# Patient Record
Sex: Female | Born: 1970 | Race: Black or African American | Hispanic: No | Marital: Married | Smoking: Never smoker
Health system: Southern US, Community
[De-identification: ages and names within clinical notes are randomized; demographics above are authoritative.]

## PROBLEM LIST (undated history)

## (undated) DIAGNOSIS — F419 Anxiety disorder, unspecified: Secondary | ICD-10-CM

## (undated) DIAGNOSIS — N179 Acute kidney failure, unspecified: Secondary | ICD-10-CM

## (undated) DIAGNOSIS — F329 Major depressive disorder, single episode, unspecified: Secondary | ICD-10-CM

## (undated) DIAGNOSIS — T50902A Poisoning by unspecified drugs, medicaments and biological substances, intentional self-harm, initial encounter: Secondary | ICD-10-CM

## (undated) DIAGNOSIS — M6282 Rhabdomyolysis: Secondary | ICD-10-CM

## (undated) DIAGNOSIS — F32A Depression, unspecified: Secondary | ICD-10-CM

## (undated) DIAGNOSIS — I10 Essential (primary) hypertension: Secondary | ICD-10-CM

## (undated) DIAGNOSIS — J45909 Unspecified asthma, uncomplicated: Secondary | ICD-10-CM

## (undated) HISTORY — PX: CHOLECYSTECTOMY: SHX55

## (undated) HISTORY — DX: Poisoning by unspecified drugs, medicaments and biological substances, intentional self-harm, initial encounter: T50.902A

## (undated) HISTORY — DX: Rhabdomyolysis: M62.82

## (undated) HISTORY — DX: Acute kidney failure, unspecified: N17.9

---

## 1996-03-10 HISTORY — PX: TUBAL LIGATION: SHX77

## 1997-08-29 ENCOUNTER — Emergency Department (HOSPITAL_COMMUNITY): Admission: EM | Admit: 1997-08-29 | Discharge: 1997-08-29 | Payer: Self-pay | Admitting: Emergency Medicine

## 1998-02-21 ENCOUNTER — Emergency Department (HOSPITAL_COMMUNITY): Admission: EM | Admit: 1998-02-21 | Discharge: 1998-02-21 | Payer: Self-pay | Admitting: Emergency Medicine

## 1998-09-21 ENCOUNTER — Encounter: Admission: RE | Admit: 1998-09-21 | Discharge: 1998-09-21 | Payer: Self-pay | Admitting: Family Medicine

## 1998-10-23 ENCOUNTER — Encounter: Admission: RE | Admit: 1998-10-23 | Discharge: 1998-10-23 | Payer: Self-pay | Admitting: Sports Medicine

## 1998-10-25 ENCOUNTER — Encounter: Admission: RE | Admit: 1998-10-25 | Discharge: 1998-10-25 | Payer: Self-pay | Admitting: Family Medicine

## 1998-11-06 ENCOUNTER — Encounter: Admission: RE | Admit: 1998-11-06 | Discharge: 1998-11-06 | Payer: Self-pay | Admitting: Sports Medicine

## 1998-11-27 ENCOUNTER — Encounter: Admission: RE | Admit: 1998-11-27 | Discharge: 1998-11-27 | Payer: Self-pay | Admitting: Sports Medicine

## 1998-11-29 ENCOUNTER — Encounter: Admission: RE | Admit: 1998-11-29 | Discharge: 1998-11-29 | Payer: Self-pay | Admitting: Family Medicine

## 1999-02-05 ENCOUNTER — Encounter: Admission: RE | Admit: 1999-02-05 | Discharge: 1999-02-05 | Payer: Self-pay | Admitting: Sports Medicine

## 1999-05-10 ENCOUNTER — Encounter: Admission: RE | Admit: 1999-05-10 | Discharge: 1999-05-10 | Payer: Self-pay | Admitting: Family Medicine

## 1999-05-16 ENCOUNTER — Encounter: Admission: RE | Admit: 1999-05-16 | Discharge: 1999-05-16 | Payer: Self-pay | Admitting: *Deleted

## 1999-09-05 ENCOUNTER — Encounter: Admission: RE | Admit: 1999-09-05 | Discharge: 1999-09-05 | Payer: Self-pay | Admitting: Family Medicine

## 1999-09-22 ENCOUNTER — Emergency Department (HOSPITAL_COMMUNITY): Admission: EM | Admit: 1999-09-22 | Discharge: 1999-09-22 | Payer: Self-pay | Admitting: *Deleted

## 2001-05-25 ENCOUNTER — Encounter: Admission: RE | Admit: 2001-05-25 | Discharge: 2001-05-25 | Payer: Self-pay | Admitting: Family Medicine

## 2001-08-05 ENCOUNTER — Emergency Department (HOSPITAL_COMMUNITY): Admission: EM | Admit: 2001-08-05 | Discharge: 2001-08-05 | Payer: Self-pay | Admitting: Emergency Medicine

## 2001-08-05 ENCOUNTER — Encounter: Payer: Self-pay | Admitting: Emergency Medicine

## 2001-09-09 ENCOUNTER — Encounter: Admission: RE | Admit: 2001-09-09 | Discharge: 2001-09-09 | Payer: Self-pay | Admitting: Family Medicine

## 2001-10-19 ENCOUNTER — Encounter: Admission: RE | Admit: 2001-10-19 | Discharge: 2001-10-19 | Payer: Self-pay | Admitting: Family Medicine

## 2001-11-05 ENCOUNTER — Encounter: Admission: RE | Admit: 2001-11-05 | Discharge: 2001-11-05 | Payer: Self-pay | Admitting: Family Medicine

## 2002-02-11 ENCOUNTER — Encounter: Admission: RE | Admit: 2002-02-11 | Discharge: 2002-02-11 | Payer: Self-pay | Admitting: Family Medicine

## 2002-03-07 ENCOUNTER — Encounter: Admission: RE | Admit: 2002-03-07 | Discharge: 2002-03-07 | Payer: Self-pay | Admitting: Family Medicine

## 2002-04-12 ENCOUNTER — Encounter: Payer: Self-pay | Admitting: Emergency Medicine

## 2002-04-12 ENCOUNTER — Emergency Department (HOSPITAL_COMMUNITY): Admission: EM | Admit: 2002-04-12 | Discharge: 2002-04-12 | Payer: Self-pay | Admitting: Emergency Medicine

## 2002-08-29 ENCOUNTER — Encounter: Admission: RE | Admit: 2002-08-29 | Discharge: 2002-08-29 | Payer: Self-pay | Admitting: Family Medicine

## 2002-11-25 ENCOUNTER — Encounter: Admission: RE | Admit: 2002-11-25 | Discharge: 2002-11-25 | Payer: Self-pay | Admitting: Sports Medicine

## 2002-12-06 ENCOUNTER — Encounter: Admission: RE | Admit: 2002-12-06 | Discharge: 2002-12-06 | Payer: Self-pay | Admitting: Family Medicine

## 2003-01-02 ENCOUNTER — Encounter: Admission: RE | Admit: 2003-01-02 | Discharge: 2003-01-02 | Payer: Self-pay | Admitting: Family Medicine

## 2003-02-16 ENCOUNTER — Encounter: Admission: RE | Admit: 2003-02-16 | Discharge: 2003-02-16 | Payer: Self-pay | Admitting: Family Medicine

## 2003-02-19 ENCOUNTER — Emergency Department (HOSPITAL_COMMUNITY): Admission: EM | Admit: 2003-02-19 | Discharge: 2003-02-19 | Payer: Self-pay | Admitting: Emergency Medicine

## 2003-02-21 ENCOUNTER — Encounter: Admission: RE | Admit: 2003-02-21 | Discharge: 2003-02-21 | Payer: Self-pay | Admitting: Sports Medicine

## 2003-02-21 ENCOUNTER — Ambulatory Visit (HOSPITAL_COMMUNITY): Admission: RE | Admit: 2003-02-21 | Discharge: 2003-02-21 | Payer: Self-pay | Admitting: Sports Medicine

## 2003-02-27 ENCOUNTER — Encounter: Admission: RE | Admit: 2003-02-27 | Discharge: 2003-02-27 | Payer: Self-pay | Admitting: Family Medicine

## 2003-05-26 ENCOUNTER — Encounter: Admission: RE | Admit: 2003-05-26 | Discharge: 2003-05-26 | Payer: Self-pay | Admitting: Sports Medicine

## 2003-06-06 ENCOUNTER — Encounter: Admission: RE | Admit: 2003-06-06 | Discharge: 2003-06-06 | Payer: Self-pay | Admitting: Sports Medicine

## 2003-06-28 ENCOUNTER — Encounter: Admission: RE | Admit: 2003-06-28 | Discharge: 2003-06-28 | Payer: Self-pay | Admitting: Family Medicine

## 2003-07-03 ENCOUNTER — Encounter: Admission: RE | Admit: 2003-07-03 | Discharge: 2003-07-03 | Payer: Self-pay | Admitting: Family Medicine

## 2003-07-11 ENCOUNTER — Encounter: Admission: RE | Admit: 2003-07-11 | Discharge: 2003-07-11 | Payer: Self-pay | Admitting: Sports Medicine

## 2003-07-19 ENCOUNTER — Encounter: Admission: RE | Admit: 2003-07-19 | Discharge: 2003-07-19 | Payer: Self-pay | Admitting: Family Medicine

## 2003-08-17 ENCOUNTER — Encounter: Admission: RE | Admit: 2003-08-17 | Discharge: 2003-08-17 | Payer: Self-pay | Admitting: Family Medicine

## 2003-11-14 ENCOUNTER — Ambulatory Visit: Payer: Self-pay | Admitting: Family Medicine

## 2004-04-04 ENCOUNTER — Ambulatory Visit: Payer: Self-pay | Admitting: Family Medicine

## 2004-05-20 ENCOUNTER — Ambulatory Visit: Payer: Self-pay | Admitting: Sports Medicine

## 2004-08-19 ENCOUNTER — Ambulatory Visit: Payer: Self-pay | Admitting: Sports Medicine

## 2004-11-06 ENCOUNTER — Ambulatory Visit: Payer: Self-pay | Admitting: Family Medicine

## 2005-01-13 ENCOUNTER — Ambulatory Visit: Payer: Self-pay | Admitting: Family Medicine

## 2005-01-15 ENCOUNTER — Emergency Department (HOSPITAL_COMMUNITY): Admission: EM | Admit: 2005-01-15 | Discharge: 2005-01-15 | Payer: Self-pay | Admitting: Emergency Medicine

## 2005-01-22 ENCOUNTER — Ambulatory Visit: Payer: Self-pay | Admitting: Family Medicine

## 2005-01-22 ENCOUNTER — Encounter: Admission: RE | Admit: 2005-01-22 | Discharge: 2005-01-22 | Payer: Self-pay | Admitting: Sports Medicine

## 2005-02-18 ENCOUNTER — Ambulatory Visit: Payer: Self-pay | Admitting: Family Medicine

## 2005-02-19 ENCOUNTER — Encounter: Admission: RE | Admit: 2005-02-19 | Discharge: 2005-02-19 | Payer: Self-pay | Admitting: Sports Medicine

## 2005-03-06 ENCOUNTER — Encounter: Admission: RE | Admit: 2005-03-06 | Discharge: 2005-03-06 | Payer: Self-pay | Admitting: Sports Medicine

## 2005-03-06 ENCOUNTER — Ambulatory Visit: Payer: Self-pay | Admitting: Sports Medicine

## 2005-03-12 ENCOUNTER — Ambulatory Visit: Payer: Self-pay | Admitting: Sports Medicine

## 2005-03-27 ENCOUNTER — Encounter: Admission: RE | Admit: 2005-03-27 | Discharge: 2005-03-27 | Payer: Self-pay | Admitting: Sports Medicine

## 2005-03-28 ENCOUNTER — Ambulatory Visit: Payer: Self-pay | Admitting: Sports Medicine

## 2005-04-03 ENCOUNTER — Emergency Department (HOSPITAL_COMMUNITY): Admission: EM | Admit: 2005-04-03 | Discharge: 2005-04-04 | Payer: Self-pay | Admitting: Emergency Medicine

## 2005-04-23 ENCOUNTER — Encounter: Admission: RE | Admit: 2005-04-23 | Discharge: 2005-04-23 | Payer: Self-pay | Admitting: Sports Medicine

## 2005-04-25 ENCOUNTER — Ambulatory Visit: Payer: Self-pay | Admitting: Sports Medicine

## 2005-05-05 ENCOUNTER — Ambulatory Visit: Payer: Self-pay | Admitting: Family Medicine

## 2005-06-10 ENCOUNTER — Ambulatory Visit: Payer: Self-pay | Admitting: Sports Medicine

## 2005-06-13 ENCOUNTER — Ambulatory Visit: Payer: Self-pay | Admitting: Family Medicine

## 2006-01-07 ENCOUNTER — Ambulatory Visit (HOSPITAL_COMMUNITY): Admission: RE | Admit: 2006-01-07 | Discharge: 2006-01-07 | Payer: Self-pay | Admitting: Family Medicine

## 2006-01-07 ENCOUNTER — Ambulatory Visit: Payer: Self-pay | Admitting: Family Medicine

## 2006-05-07 DIAGNOSIS — E039 Hypothyroidism, unspecified: Secondary | ICD-10-CM | POA: Insufficient documentation

## 2006-05-07 DIAGNOSIS — D509 Iron deficiency anemia, unspecified: Secondary | ICD-10-CM

## 2006-05-07 DIAGNOSIS — E118 Type 2 diabetes mellitus with unspecified complications: Secondary | ICD-10-CM

## 2006-05-07 DIAGNOSIS — E119 Type 2 diabetes mellitus without complications: Secondary | ICD-10-CM | POA: Insufficient documentation

## 2006-05-07 DIAGNOSIS — F411 Generalized anxiety disorder: Secondary | ICD-10-CM | POA: Insufficient documentation

## 2006-05-07 DIAGNOSIS — F429 Obsessive-compulsive disorder, unspecified: Secondary | ICD-10-CM | POA: Insufficient documentation

## 2006-05-07 DIAGNOSIS — F339 Major depressive disorder, recurrent, unspecified: Secondary | ICD-10-CM

## 2006-05-07 DIAGNOSIS — F419 Anxiety disorder, unspecified: Secondary | ICD-10-CM | POA: Insufficient documentation

## 2006-05-08 ENCOUNTER — Encounter (INDEPENDENT_AMBULATORY_CARE_PROVIDER_SITE_OTHER): Payer: Self-pay | Admitting: Family Medicine

## 2006-05-08 ENCOUNTER — Ambulatory Visit: Payer: Self-pay | Admitting: Family Medicine

## 2006-05-08 LAB — CONVERTED CEMR LAB
Albumin: 4.1 g/dL (ref 3.5–5.2)
Alkaline Phosphatase: 41 units/L (ref 39–117)
Chloride: 106 meq/L (ref 96–112)
Creatinine, Ser: 0.84 mg/dL (ref 0.40–1.20)

## 2006-07-17 ENCOUNTER — Telehealth (INDEPENDENT_AMBULATORY_CARE_PROVIDER_SITE_OTHER): Payer: Self-pay | Admitting: *Deleted

## 2006-07-20 ENCOUNTER — Ambulatory Visit: Payer: Self-pay | Admitting: Sports Medicine

## 2006-07-30 ENCOUNTER — Encounter: Admission: RE | Admit: 2006-07-30 | Discharge: 2006-07-30 | Payer: Self-pay | Admitting: Sports Medicine

## 2006-08-07 ENCOUNTER — Ambulatory Visit: Payer: Self-pay | Admitting: Family Medicine

## 2006-08-26 ENCOUNTER — Ambulatory Visit: Payer: Self-pay | Admitting: Family Medicine

## 2006-08-26 LAB — CONVERTED CEMR LAB: Hgb A1c MFr Bld: 6.7 %

## 2006-08-27 ENCOUNTER — Encounter (INDEPENDENT_AMBULATORY_CARE_PROVIDER_SITE_OTHER): Payer: Self-pay | Admitting: Family Medicine

## 2006-09-09 ENCOUNTER — Telehealth (INDEPENDENT_AMBULATORY_CARE_PROVIDER_SITE_OTHER): Payer: Self-pay | Admitting: Family Medicine

## 2006-09-15 ENCOUNTER — Telehealth (INDEPENDENT_AMBULATORY_CARE_PROVIDER_SITE_OTHER): Payer: Self-pay

## 2006-09-17 ENCOUNTER — Encounter (INDEPENDENT_AMBULATORY_CARE_PROVIDER_SITE_OTHER): Payer: Self-pay | Admitting: Family Medicine

## 2006-09-18 ENCOUNTER — Encounter (INDEPENDENT_AMBULATORY_CARE_PROVIDER_SITE_OTHER): Payer: Self-pay | Admitting: *Deleted

## 2006-09-21 ENCOUNTER — Ambulatory Visit: Payer: Self-pay | Admitting: Family Medicine

## 2006-09-21 LAB — CONVERTED CEMR LAB: Beta hcg, urine, semiquantitative: NEGATIVE

## 2006-10-26 ENCOUNTER — Ambulatory Visit: Payer: Self-pay | Admitting: Family Medicine

## 2006-10-26 ENCOUNTER — Telehealth: Payer: Self-pay | Admitting: *Deleted

## 2006-10-26 ENCOUNTER — Encounter (INDEPENDENT_AMBULATORY_CARE_PROVIDER_SITE_OTHER): Payer: Self-pay | Admitting: Family Medicine

## 2006-10-26 LAB — CONVERTED CEMR LAB
GC Probe Amp, Genital: NEGATIVE
KOH Prep: NEGATIVE
Specific Gravity, Urine: 1.025
Whiff Test: NEGATIVE

## 2006-11-02 ENCOUNTER — Telehealth (INDEPENDENT_AMBULATORY_CARE_PROVIDER_SITE_OTHER): Payer: Self-pay | Admitting: Family Medicine

## 2007-01-14 ENCOUNTER — Telehealth (INDEPENDENT_AMBULATORY_CARE_PROVIDER_SITE_OTHER): Payer: Self-pay | Admitting: Family Medicine

## 2007-02-03 ENCOUNTER — Ambulatory Visit: Payer: Self-pay | Admitting: Family Medicine

## 2007-02-03 ENCOUNTER — Encounter: Payer: Self-pay | Admitting: Family Medicine

## 2007-02-03 DIAGNOSIS — I10 Essential (primary) hypertension: Secondary | ICD-10-CM

## 2007-02-03 LAB — CONVERTED CEMR LAB: Beta hcg, urine, semiquantitative: NEGATIVE

## 2007-02-05 ENCOUNTER — Observation Stay (HOSPITAL_COMMUNITY): Admission: AD | Admit: 2007-02-05 | Discharge: 2007-02-06 | Payer: Self-pay | Admitting: Family Medicine

## 2007-02-05 ENCOUNTER — Ambulatory Visit: Payer: Self-pay | Admitting: Family Medicine

## 2007-02-06 ENCOUNTER — Encounter: Payer: Self-pay | Admitting: Family Medicine

## 2007-02-08 ENCOUNTER — Telehealth: Payer: Self-pay | Admitting: *Deleted

## 2007-02-10 ENCOUNTER — Encounter (INDEPENDENT_AMBULATORY_CARE_PROVIDER_SITE_OTHER): Payer: Self-pay | Admitting: Family Medicine

## 2007-02-11 LAB — CONVERTED CEMR LAB
BUN: 10 mg/dL (ref 6–23)
Calcium: 9.5 mg/dL (ref 8.4–10.5)
Creatinine, Ser: 0.77 mg/dL (ref 0.40–1.20)
Hgb A1c MFr Bld: 7 % — ABNORMAL HIGH (ref 4.6–6.1)
MCV: 63.4 fL — ABNORMAL LOW (ref 78.0–100.0)
Platelets: 587 10*3/uL — ABNORMAL HIGH (ref 150–400)
RDW: 20.3 % — ABNORMAL HIGH (ref 11.5–15.5)
WBC: 12.5 10*3/uL — ABNORMAL HIGH (ref 4.0–10.5)

## 2007-03-31 ENCOUNTER — Ambulatory Visit: Payer: Self-pay | Admitting: Family Medicine

## 2007-04-01 ENCOUNTER — Telehealth (INDEPENDENT_AMBULATORY_CARE_PROVIDER_SITE_OTHER): Payer: Self-pay | Admitting: *Deleted

## 2007-04-01 ENCOUNTER — Telehealth (INDEPENDENT_AMBULATORY_CARE_PROVIDER_SITE_OTHER): Payer: Self-pay | Admitting: Family Medicine

## 2007-06-24 ENCOUNTER — Encounter (INDEPENDENT_AMBULATORY_CARE_PROVIDER_SITE_OTHER): Payer: Self-pay | Admitting: Family Medicine

## 2007-06-24 ENCOUNTER — Ambulatory Visit: Payer: Self-pay | Admitting: Family Medicine

## 2007-06-24 DIAGNOSIS — J309 Allergic rhinitis, unspecified: Secondary | ICD-10-CM | POA: Insufficient documentation

## 2007-06-25 ENCOUNTER — Encounter (INDEPENDENT_AMBULATORY_CARE_PROVIDER_SITE_OTHER): Payer: Self-pay | Admitting: Family Medicine

## 2007-06-25 LAB — CONVERTED CEMR LAB
Hemoglobin: 9.1 g/dL — ABNORMAL LOW (ref 12.0–15.0)
MCV: 71.2 fL — ABNORMAL LOW (ref 78.0–100.0)
Platelets: 630 10*3/uL — ABNORMAL HIGH (ref 150–400)
RBC: 4.34 M/uL (ref 3.87–5.11)
RDW: 15.8 % — ABNORMAL HIGH (ref 11.5–15.5)
TSH: 6.812 microintl units/mL — ABNORMAL HIGH (ref 0.350–5.50)
WBC: 9.4 10*3/uL (ref 4.0–10.5)

## 2007-07-07 ENCOUNTER — Telehealth: Payer: Self-pay | Admitting: *Deleted

## 2007-09-09 ENCOUNTER — Telehealth (INDEPENDENT_AMBULATORY_CARE_PROVIDER_SITE_OTHER): Payer: Self-pay | Admitting: Family Medicine

## 2007-09-29 ENCOUNTER — Ambulatory Visit: Payer: Self-pay | Admitting: Family Medicine

## 2007-12-31 ENCOUNTER — Ambulatory Visit: Payer: Self-pay | Admitting: Family Medicine

## 2007-12-31 ENCOUNTER — Encounter (INDEPENDENT_AMBULATORY_CARE_PROVIDER_SITE_OTHER): Payer: Self-pay | Admitting: Family Medicine

## 2007-12-31 LAB — CONVERTED CEMR LAB
Platelets: 555 10*3/uL — ABNORMAL HIGH (ref 150–400)
WBC: 8.1 10*3/uL (ref 4.0–10.5)

## 2008-01-01 ENCOUNTER — Encounter (INDEPENDENT_AMBULATORY_CARE_PROVIDER_SITE_OTHER): Payer: Self-pay | Admitting: Family Medicine

## 2008-01-01 ENCOUNTER — Observation Stay (HOSPITAL_COMMUNITY): Admission: EM | Admit: 2008-01-01 | Discharge: 2008-01-03 | Payer: Self-pay | Admitting: Family Medicine

## 2008-01-01 ENCOUNTER — Ambulatory Visit: Payer: Self-pay | Admitting: Family Medicine

## 2008-01-01 LAB — CONVERTED CEMR LAB
Ferritin: 2 ng/mL
Retic Ct Pct: 1.4 %
TSH: 15.88 microintl units/mL
Triglycerides: 188 mg/dL

## 2008-01-02 ENCOUNTER — Encounter (INDEPENDENT_AMBULATORY_CARE_PROVIDER_SITE_OTHER): Payer: Self-pay | Admitting: Family Medicine

## 2008-01-03 ENCOUNTER — Encounter (INDEPENDENT_AMBULATORY_CARE_PROVIDER_SITE_OTHER): Payer: Self-pay | Admitting: Family Medicine

## 2008-01-03 DIAGNOSIS — N920 Excessive and frequent menstruation with regular cycle: Secondary | ICD-10-CM | POA: Insufficient documentation

## 2008-01-03 LAB — CONVERTED CEMR LAB: Retic Ct Pct: 1.4 %

## 2008-01-07 ENCOUNTER — Telehealth: Payer: Self-pay | Admitting: *Deleted

## 2008-01-28 ENCOUNTER — Encounter (INDEPENDENT_AMBULATORY_CARE_PROVIDER_SITE_OTHER): Payer: Self-pay | Admitting: Family Medicine

## 2008-01-28 ENCOUNTER — Ambulatory Visit: Payer: Self-pay | Admitting: Family Medicine

## 2008-01-28 LAB — CONVERTED CEMR LAB: Hemoglobin: 9.9 g/dL

## 2008-01-31 ENCOUNTER — Ambulatory Visit (HOSPITAL_COMMUNITY): Admission: RE | Admit: 2008-01-31 | Discharge: 2008-01-31 | Payer: Self-pay | Admitting: Family Medicine

## 2008-02-01 LAB — CONVERTED CEMR LAB
HCT: 35.5 % — ABNORMAL LOW (ref 36.0–46.0)
Hemoglobin: 10.6 g/dL — ABNORMAL LOW (ref 12.0–15.0)
MCV: 77.5 fL — ABNORMAL LOW (ref 78.0–100.0)
RBC: 4.58 M/uL (ref 3.87–5.11)
RDW: 28.1 % — ABNORMAL HIGH (ref 11.5–15.5)
TSH: 3.822 microintl units/mL (ref 0.350–4.50)

## 2008-02-07 ENCOUNTER — Telehealth: Payer: Self-pay | Admitting: *Deleted

## 2008-02-07 DIAGNOSIS — N8500 Endometrial hyperplasia, unspecified: Secondary | ICD-10-CM | POA: Insufficient documentation

## 2008-03-01 ENCOUNTER — Encounter (INDEPENDENT_AMBULATORY_CARE_PROVIDER_SITE_OTHER): Payer: Self-pay | Admitting: Family Medicine

## 2008-03-01 ENCOUNTER — Ambulatory Visit: Payer: Self-pay | Admitting: Family Medicine

## 2008-03-01 LAB — CONVERTED CEMR LAB: Folate: >20 ng/mL

## 2008-03-07 ENCOUNTER — Encounter (INDEPENDENT_AMBULATORY_CARE_PROVIDER_SITE_OTHER): Payer: Self-pay | Admitting: Family Medicine

## 2008-03-28 ENCOUNTER — Telehealth: Payer: Self-pay | Admitting: *Deleted

## 2008-03-30 ENCOUNTER — Telehealth (INDEPENDENT_AMBULATORY_CARE_PROVIDER_SITE_OTHER): Payer: Self-pay | Admitting: Family Medicine

## 2008-05-04 ENCOUNTER — Encounter (INDEPENDENT_AMBULATORY_CARE_PROVIDER_SITE_OTHER): Payer: Self-pay | Admitting: Family Medicine

## 2008-05-04 ENCOUNTER — Telehealth: Payer: Self-pay | Admitting: *Deleted

## 2008-05-23 ENCOUNTER — Ambulatory Visit: Payer: Self-pay | Admitting: Family Medicine

## 2008-05-23 ENCOUNTER — Encounter (INDEPENDENT_AMBULATORY_CARE_PROVIDER_SITE_OTHER): Payer: Self-pay | Admitting: Family Medicine

## 2008-05-24 ENCOUNTER — Telehealth: Payer: Self-pay | Admitting: Family Medicine

## 2008-05-31 ENCOUNTER — Emergency Department (HOSPITAL_COMMUNITY): Admission: EM | Admit: 2008-05-31 | Discharge: 2008-05-31 | Payer: Self-pay | Admitting: Emergency Medicine

## 2008-07-03 ENCOUNTER — Telehealth (INDEPENDENT_AMBULATORY_CARE_PROVIDER_SITE_OTHER): Payer: Self-pay | Admitting: Family Medicine

## 2008-07-08 ENCOUNTER — Telehealth: Payer: Self-pay | Admitting: Family Medicine

## 2008-08-10 ENCOUNTER — Encounter (INDEPENDENT_AMBULATORY_CARE_PROVIDER_SITE_OTHER): Payer: Self-pay | Admitting: Family Medicine

## 2008-08-24 ENCOUNTER — Ambulatory Visit: Payer: Self-pay | Admitting: Obstetrics & Gynecology

## 2008-08-28 ENCOUNTER — Ambulatory Visit: Payer: Self-pay | Admitting: Family Medicine

## 2008-08-28 ENCOUNTER — Observation Stay (HOSPITAL_COMMUNITY): Admission: EM | Admit: 2008-08-28 | Discharge: 2008-08-29 | Payer: Self-pay | Admitting: Emergency Medicine

## 2008-08-29 ENCOUNTER — Telehealth: Payer: Self-pay | Admitting: *Deleted

## 2008-08-29 ENCOUNTER — Encounter: Payer: Self-pay | Admitting: Family Medicine

## 2008-08-29 ENCOUNTER — Encounter: Payer: Self-pay | Admitting: *Deleted

## 2008-09-07 ENCOUNTER — Ambulatory Visit: Payer: Self-pay | Admitting: Obstetrics & Gynecology

## 2008-09-07 LAB — CONVERTED CEMR LAB

## 2008-09-14 ENCOUNTER — Other Ambulatory Visit: Admission: RE | Admit: 2008-09-14 | Discharge: 2008-09-14 | Payer: Self-pay | Admitting: Obstetrics & Gynecology

## 2008-09-14 ENCOUNTER — Ambulatory Visit: Payer: Self-pay | Admitting: Obstetrics & Gynecology

## 2008-09-14 ENCOUNTER — Ambulatory Visit (HOSPITAL_COMMUNITY): Admission: RE | Admit: 2008-09-14 | Discharge: 2008-09-14 | Payer: Self-pay | Admitting: Family Medicine

## 2008-09-21 ENCOUNTER — Ambulatory Visit: Payer: Self-pay | Admitting: Obstetrics & Gynecology

## 2008-09-21 ENCOUNTER — Ambulatory Visit (HOSPITAL_COMMUNITY): Admission: RE | Admit: 2008-09-21 | Discharge: 2008-09-21 | Payer: Self-pay | Admitting: Obstetrics & Gynecology

## 2008-09-21 ENCOUNTER — Encounter: Payer: Self-pay | Admitting: Obstetrics & Gynecology

## 2008-10-18 ENCOUNTER — Telehealth: Payer: Self-pay | Admitting: Family Medicine

## 2008-11-03 ENCOUNTER — Encounter: Payer: Self-pay | Admitting: *Deleted

## 2008-11-18 ENCOUNTER — Emergency Department (HOSPITAL_COMMUNITY): Admission: EM | Admit: 2008-11-18 | Discharge: 2008-11-18 | Payer: Self-pay | Admitting: Emergency Medicine

## 2008-11-22 ENCOUNTER — Encounter: Payer: Self-pay | Admitting: Family Medicine

## 2008-11-30 ENCOUNTER — Encounter: Payer: Self-pay | Admitting: Family Medicine

## 2008-11-30 ENCOUNTER — Encounter (INDEPENDENT_AMBULATORY_CARE_PROVIDER_SITE_OTHER): Payer: Self-pay | Admitting: *Deleted

## 2008-11-30 ENCOUNTER — Ambulatory Visit: Payer: Self-pay | Admitting: Family Medicine

## 2008-11-30 DIAGNOSIS — K8020 Calculus of gallbladder without cholecystitis without obstruction: Secondary | ICD-10-CM | POA: Insufficient documentation

## 2008-11-30 LAB — CONVERTED CEMR LAB
ALT: 13 units/L (ref 0–35)
AST: 17 units/L (ref 0–37)
Albumin: 3.8 g/dL (ref 3.5–5.2)
Alkaline Phosphatase: 43 units/L (ref 39–117)
Calcium: 9.3 mg/dL (ref 8.4–10.5)
Chloride: 105 meq/L (ref 96–112)
Creatinine, Ser: 0.77 mg/dL (ref 0.40–1.20)
Hgb A1c MFr Bld: 8 %
Platelets: 510 10*3/uL — ABNORMAL HIGH (ref 150–400)
Potassium: 4.5 meq/L (ref 3.5–5.3)
RDW: 16.7 % — ABNORMAL HIGH (ref 11.5–15.5)

## 2008-12-01 ENCOUNTER — Telehealth: Payer: Self-pay | Admitting: *Deleted

## 2008-12-01 ENCOUNTER — Telehealth: Payer: Self-pay | Admitting: Family Medicine

## 2008-12-01 ENCOUNTER — Encounter (INDEPENDENT_AMBULATORY_CARE_PROVIDER_SITE_OTHER): Payer: Self-pay | Admitting: *Deleted

## 2009-01-16 ENCOUNTER — Telehealth: Payer: Self-pay | Admitting: *Deleted

## 2009-02-25 ENCOUNTER — Telehealth: Payer: Self-pay | Admitting: Family Medicine

## 2009-02-27 ENCOUNTER — Emergency Department (HOSPITAL_COMMUNITY): Admission: EM | Admit: 2009-02-27 | Discharge: 2009-02-27 | Payer: Self-pay | Admitting: Emergency Medicine

## 2009-03-06 ENCOUNTER — Telehealth: Payer: Self-pay | Admitting: Family Medicine

## 2009-03-06 ENCOUNTER — Encounter: Payer: Self-pay | Admitting: *Deleted

## 2009-03-06 ENCOUNTER — Ambulatory Visit: Payer: Self-pay | Admitting: Family Medicine

## 2009-03-06 ENCOUNTER — Encounter: Payer: Self-pay | Admitting: Family Medicine

## 2009-03-06 LAB — CONVERTED CEMR LAB
Albumin: 4.1 g/dL (ref 3.5–5.2)
Alkaline Phosphatase: 43 units/L (ref 39–117)
BUN: 11 mg/dL (ref 6–23)
CO2: 23 meq/L (ref 19–32)
Glucose, Bld: 173 mg/dL — ABNORMAL HIGH (ref 70–99)
Potassium: 4.2 meq/L (ref 3.5–5.3)

## 2009-04-20 ENCOUNTER — Encounter: Payer: Self-pay | Admitting: Family Medicine

## 2009-04-20 ENCOUNTER — Ambulatory Visit: Payer: Self-pay | Admitting: Family Medicine

## 2009-05-09 ENCOUNTER — Emergency Department (HOSPITAL_COMMUNITY): Admission: EM | Admit: 2009-05-09 | Discharge: 2009-05-09 | Payer: Self-pay | Admitting: Emergency Medicine

## 2009-05-16 ENCOUNTER — Ambulatory Visit (HOSPITAL_COMMUNITY): Admission: RE | Admit: 2009-05-16 | Discharge: 2009-05-17 | Payer: Self-pay | Admitting: General Surgery

## 2009-05-16 ENCOUNTER — Encounter (INDEPENDENT_AMBULATORY_CARE_PROVIDER_SITE_OTHER): Payer: Self-pay | Admitting: General Surgery

## 2009-06-01 ENCOUNTER — Telehealth: Payer: Self-pay | Admitting: *Deleted

## 2009-08-13 ENCOUNTER — Encounter: Payer: Self-pay | Admitting: Family Medicine

## 2009-08-24 ENCOUNTER — Ambulatory Visit: Payer: Self-pay | Admitting: Family Medicine

## 2009-08-24 LAB — CONVERTED CEMR LAB: Hgb A1c MFr Bld: 7.9 %

## 2009-09-20 ENCOUNTER — Encounter: Payer: Self-pay | Admitting: Family Medicine

## 2009-11-12 ENCOUNTER — Telehealth: Payer: Self-pay | Admitting: Sports Medicine

## 2009-11-13 ENCOUNTER — Emergency Department (HOSPITAL_COMMUNITY): Admission: EM | Admit: 2009-11-13 | Discharge: 2009-11-13 | Payer: Self-pay | Admitting: Family Medicine

## 2009-11-30 ENCOUNTER — Telehealth: Payer: Self-pay | Admitting: Family Medicine

## 2009-12-06 ENCOUNTER — Telehealth: Payer: Self-pay | Admitting: Family Medicine

## 2009-12-06 ENCOUNTER — Encounter: Payer: Self-pay | Admitting: Family Medicine

## 2010-01-04 ENCOUNTER — Encounter (INDEPENDENT_AMBULATORY_CARE_PROVIDER_SITE_OTHER): Payer: Self-pay | Admitting: Pharmacist

## 2010-01-14 ENCOUNTER — Encounter: Payer: Self-pay | Admitting: Family Medicine

## 2010-01-14 ENCOUNTER — Ambulatory Visit: Payer: Self-pay | Admitting: Family Medicine

## 2010-01-14 DIAGNOSIS — R109 Unspecified abdominal pain: Secondary | ICD-10-CM | POA: Insufficient documentation

## 2010-01-14 DIAGNOSIS — R112 Nausea with vomiting, unspecified: Secondary | ICD-10-CM

## 2010-01-14 LAB — CONVERTED CEMR LAB
Blood in Urine, dipstick: NEGATIVE
Glucose, Urine, Semiquant: NEGATIVE
Hgb A1c MFr Bld: 7.7 %
Specific Gravity, Urine: >=1.03
WBC Urine, dipstick: NEGATIVE
pH: 5.5

## 2010-01-15 ENCOUNTER — Ambulatory Visit: Payer: Self-pay | Admitting: Family Medicine

## 2010-01-15 LAB — CONVERTED CEMR LAB
ALT: 8 units/L (ref 0–35)
AST: 14 units/L (ref 0–37)
Alkaline Phosphatase: 43 units/L (ref 39–117)
BUN: 13 mg/dL (ref 6–23)
Basophils Absolute: 0 10*3/uL (ref 0.0–0.1)
Chloride: 101 meq/L (ref 96–112)
Creatinine, Ser: 0.86 mg/dL (ref 0.40–1.20)
Eosinophils Absolute: 0.2 10*3/uL (ref 0.0–0.7)
Eosinophils Relative: 2 % (ref 0–5)
HCT: 33.2 % — ABNORMAL LOW (ref 36.0–46.0)
Hemoglobin: 9.6 g/dL — ABNORMAL LOW (ref 12.0–15.0)
MCV: 71.7 fL — ABNORMAL LOW (ref 78.0–100.0)
Monocytes Absolute: 0.5 10*3/uL (ref 0.1–1.0)
Platelets: 605 10*3/uL — ABNORMAL HIGH (ref 150–400)
RDW: 17.5 % — ABNORMAL HIGH (ref 11.5–15.5)
Total Bilirubin: 0.3 mg/dL (ref 0.3–1.2)

## 2010-02-27 ENCOUNTER — Telehealth: Payer: Self-pay | Admitting: Family Medicine

## 2010-03-14 ENCOUNTER — Encounter: Payer: Self-pay | Admitting: Family Medicine

## 2010-03-31 ENCOUNTER — Encounter: Payer: Self-pay | Admitting: Family Medicine

## 2010-04-09 NOTE — Assessment & Plan Note (Signed)
Summary: stomach upset,tcb   Vital Signs:  Patient profile:   41 year old female Height:      70 inches Weight:      372 pounds BMI:     53.57 Temp:     99.0 degrees F oral Pulse rate:   83 / minute BP sitting:   124 / 72  (left arm) Cuff size:   large  Vitals Entered By: Schuyler Amor CMA (April 20, 2009 1:45 PM) CC: nausea and abdominal cramps Is Patient Diabetic? Yes Pain Assessment Patient in pain? yes     Location: abdomen Intensity: 5   Primary Care Provider:  Briscoe Deutscher MD  CC:  nausea and abdominal cramps.  History of Present Illness: CC: nausea/abd cramps  40yo with h/o gallstones planning on surgery in summer who yesterday felt nauseated.  This am woke up with bad abd cramping lower abdomen, and diarrhea.  no RUQ pain.  No vomiting.  No fevers.  No coughing or congestion.  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  Kids sick at home with abd pain/vomiting/diarrhea.  Less eating, less drinking 2/2 nausea.  Hasn't tried any meds for nausea.  Voiding well.  Would like refill of lancets, strips, and glucometer for diabetes.  Habits & Providers  Alcohol-Tobacco-Diet     Tobacco Status: never  Allergies (verified): 1)  ! * Normal Iv Catheter Tips - Pt Can Tolerate Gelco Cath Tips PMH-FH-SH reviewed for relevance  Physical Exam  General:  Friendly, morbidly obese, alert, uncomfortable Lungs:  normal respiratory effort and normal breath sounds.   Heart:  normal rate   Abdomen:  +BS, soft, obese, no apparent distension, no CVA tenderness, + TTP RUQ as well as lower abdomen.  No rebound or guarding.   Extremities:  No clubbing, cyanosis, edema. Skin:  Intact without suspicious lesions or rashes   Impression & Recommendations:  Problem # 1:  NAUSEA (ICD-787.02)  with diarrhea and abd cramping.  likely viral gastroenteritis given sick contacts as well.  pt's main concern is nausea.  wanted to give shot of phenergan in  clinic, but pt arrived alone and has to drive home.  Sent scripts for phenergan and zofran.  Discussed how this could cause flareup of gallstone pain, pt needs to keep eye out for this.  Discussed how if RUQ worsens, needs to seek urgent medical care.  Her updated medication list for this problem includes:    Promethazine Hcl 25 Mg Tabs (Promethazine hcl) ..... One by mouth q6 hours as needed nausea    Ondansetron Hcl 4 Mg Tabs (Ondansetron hcl) ..... Oen by mouth q4 hours as needed nausea (first choice)  Orders: [place]- Est  Level 4 [id number])  Problem # 2:  GALLSTONES (ICD-574.20)  counseled on low fat diet, small frequent meals  Orders: [place]- Est  Level 4 [id number])  Problem # 3:  DIABETES MELLITUS, II, COMPLICATIONS (A999333) Assessment: Unchanged  refilled glucometer, strips and lancets per patient preference.  to f/u with PCP.  not due for A1c today.  advised to stop enalapril while sick and not geting good by mouth intake.   Her updated medication list for this problem includes:    Enalapril Maleate 10 Mg Tabs (Enalapril maleate) .Marland Kitchen... Take 1/2  tablet by mouth once a day    Metformin Hcl 1000 Mg Tabs (Metformin hcl) .Marland Kitchen... Take 1 tablet by mouth twice a day  Labs Reviewed: Creat: 0.80 (03/06/2009)     Last Eye  Exam: normal (09/17/2006) Reviewed HgBA1c results: 8.0 (03/06/2009)  8.0 (11/30/2008)  Orders: [place]- Est  Level 4 [id number])  Complete Medication List: 1)  Doxepin Hcl 25 Mg Caps (Doxepin hcl) .... Take 1-2 capsule by mouth every eight hours 2)  Enalapril Maleate 10 Mg Tabs (Enalapril maleate) .... Take 1/2  tablet by mouth once a day 3)  Freestyle Lite Strp (Glucose blood) .... Use 1 strip three times a day, 1 box 4)  Levoxyl 200 Mcg Tabs (Levothyroxine sodium) .... One tablet daily 5)  Metformin Hcl 1000 Mg Tabs (Metformin hcl) .... Take 1 tablet by mouth twice a day 6)  Prozac 20 Mg Caps (Fluoxetine hcl) .... 3 tablets by mouth daily 7)  Ativan 1 Mg Tabs (Lorazepam) ....  One by mouth two times a day as needed anxiety 8)  Trazodone Hcl 50 Mg Tabs (Trazodone hcl) .... One tablet by mouth at bedtime 9)  Ventolin Hfa 108 (90 Base) Mcg/act Aers (Albuterol sulfate) .... Two puffs q 4-6 hours as needed sob, wheeze 10)  Fleet Enema 7-19 Gm/156ml Enem (Sodium phosphates) .Marland Kitchen.. 1 enema to rectum as needed constipation 11)  Oxycodone-acetaminophen 5-325 Mg Tabs (Oxycodone-acetaminophen) .... Three times a day as needed 12)  Glucometer  .... One use as directed 13)  Promethazine Hcl 25 Mg Tabs (Promethazine hcl) .... One by mouth q6 hours as needed nausea 14)  Ondansetron Hcl 4 Mg Tabs (Ondansetron hcl) .... Oen by mouth q4 hours as needed nausea (first choice) 15)  Bd Ultra-fine Lancets Misc (Lancets) .Marland Kitchen.. 1 box  Patient Instructions: 1)  You may have either gastroenteritis or a flare up of your gallstones. 2)  Push fluids. 3)  Try zofran or phenergan - whichever is cheaper. 4)  If abdominal pain gettign worse over weekend, or high fevers, go to urgent care or ER. 5)  Stop Enalapril while you are feeling sick. 6)  Wash hands alot. Prescriptions: FREESTYLE LITE  STRP (GLUCOSE BLOOD) Use 1 strip three times a day, 1 box  #1 x 3   Entered and Authorized by:   Ria Bush  MD   Signed by:   Ria Bush  MD on 04/20/2009   Method used:   Print then Give to Patient   RxID:   [id number] BD ULTRA-FINE LANCETS  MISC (LANCETS) 1 box  #1 x 3   Entered and Authorized by:   Ria Bush  MD   Signed by:   Ria Bush  MD on 04/20/2009   Method used:   Print then Give to Patient   RxID:   [id number] BD ULTRA-FINE LANCETS  MISC (LANCETS) 1 box  #1 x 3   Entered and Authorized by:   Ria Bush  MD   Signed by:   Ria Bush  MD on 04/20/2009   Method used:   Print then Give to Patient   RxID:   [id number] ONDANSETRON HCL 4 MG TABS (ONDANSETRON HCL) oen by mouth q4 hours as needed nausea (first choice)  #30 x 0   Entered and  Authorized by:   Ria Bush  MD   Signed by:   Ria Bush  MD on 04/20/2009   Method used:   Print then Give to Patient   RxID:   [id number] PROMETHAZINE HCL 25 MG TABS (PROMETHAZINE HCL) one by mouth q6 hours as needed nausea  #30 x 0   Entered and Authorized by:   Ria Bush  MD   Signed by:   Ria Bush  MD on  04/20/2009   Method used:   Print then Give to Patient   RxID:   [id number] GLUCOMETER one use as directed  #1 x 0   Entered and Authorized by:   Ria Bush  MD   Signed by:   Ria Bush  MD on 04/20/2009   Method used:   Print then Give to Patient   RxID:   [id number] FREESTYLE LITE  STRP (GLUCOSE BLOOD) Use 1 strip three times a day, 1 box  #1 x 3   Entered and Authorized by:   Ria Bush  MD   Signed by:   Ria Bush  MD on 04/20/2009   Method used:   Electronically to        [facility] (retail)       [phone]       [place], [place]  [address]       Ph: [phone]       Fax: [phone]   RxID:   [id number]

## 2010-04-09 NOTE — Progress Notes (Signed)
Summary: test results  Phone Note Call from Patient Call back at Work Phone [phone]   Caller: Patient Summary of Call: pt is wanting test results Initial call taken by: Audie Clear,  December 01, 2008 2:06 PM  Follow-up for Phone Call        will forward message to MD. Follow-up by: Marcell Barlow RN,  December 01, 2008 3:04 PM

## 2010-04-09 NOTE — Progress Notes (Signed)
Summary: Emergency line called for dark urine  Phone Note Outgoing Call   Call placed by: Cat Ta MD,  November 30, 2009 8:05 PM Call placed to: Patient Summary of Call: Pt had dark urine x 1 time today, it was dark yellow.   No fever, chills, abd pain, dysuria, frequency, blood on tissue. Everything else fine. Told pt to drink more water, likely dehydration. Initial call taken by: Cat Ta MD,  November 30, 2009 8:06 PM

## 2010-04-09 NOTE — Progress Notes (Signed)
Summary: Rx Req  Phone Note Refill Request Call back at [phone] Message from:  Patient  Refills Requested: Medication #1:  ATIVAN 1 MG  TABS one by mouth two times a day as needed anxiety [BMN]  Medication #2:  OXYCODONE-ACETAMINOPHEN 5-325 MG TABS three times a day as needed [BMN] Initial call taken by: Raymond Gurney,  December 06, 2009 9:25 AM  Follow-up for Phone Call        Letter sent stating office visit necessary for any controlled scripts after these. Follow-up by: Tereasa Coop NP,  December 06, 2009 10:50 AM    New/Updated Medications: OXYCODONE-ACETAMINOPHEN 5-325 MG TABS (OXYCODONE-ACETAMINOPHEN) three times a day as needed [BMN] Prescriptions: OXYCODONE-ACETAMINOPHEN 5-325 MG TABS (OXYCODONE-ACETAMINOPHEN) three times a day as needed Brand medically necessary #90 x 0   Entered and Authorized by:   Tereasa Coop NP   Signed by:   Tereasa Coop NP on 12/06/2009   Method used:   Print then Give to Patient   RxID:   [id number] ATIVAN 1 MG  TABS (LORAZEPAM) one by mouth two times a day as needed anxiety Brand medically necessary #90 x 0   Entered and Authorized by:   Tereasa Coop NP   Signed by:   Tereasa Coop NP on 12/06/2009   Method used:   Print then Give to Patient   RxID:   [id number]

## 2010-04-09 NOTE — Progress Notes (Signed)
Summary: triage  Phone Note Call from Patient Call back at Home Phone [phone]   Caller: Patient Summary of Call: Pt was to come in this am and did not make it.  Wondering can she be seen this afternoon. Initial call taken by: Raymond Gurney,  March 06, 2009 9:49 AM  Follow-up for Phone Call        changed appt to 3:30 with [facility]. she apoligized for missing the 8:30 this am Follow-up by: Elige Radon RN,  March 06, 2009 9:56 AM

## 2010-04-09 NOTE — Miscellaneous (Signed)
Summary: Orders Update  Clinical Lists Changes  Problems: Added new problem of ENCOUNTER FOR LONG-TERM USE OF OTHER MEDICATIONS (ICD-V58.69) Orders: Added new Test order of B12-[facility] [phone]) - Signed Added new Test order of CBC-[facility] [id number]) - Signed  Ok per Dr. Zebedee Iba

## 2010-04-09 NOTE — Assessment & Plan Note (Signed)
Summary: n/v/not feeling good,df   Vital Signs:  Patient profile:   40 year old female Weight:      364.1 pounds Temp:     98.6 degrees F oral Pulse rate:   93 / minute BP sitting:   163 / 90  (right arm) Cuff size:   large  Vitals Entered By: Gerrit Heck slade,cma CC: nausea/vomitting and stomach pain x friday evening. Is Patient Diabetic? Yes Pain Assessment Patient in pain? yes     Location: stomach Intensity: 5 Onset of pain  x friday   Primary Care Provider:  Briscoe Deutscher MD  CC:  nausea/vomitting and stomach pain x friday evening.Marland Kitchen  History of Present Illness: Nausea and vomiting for 2 days.  Deneis fever.  Does not think her blood sugars have been higher than ususal.  She is trying to lose weight and eat better.  Intermittent abdominal pain, has not had any diarrhea.   Since she has not been able to eat or drink much.  She is unable to work (works 2 jobs)  Habits & Providers  Alcohol-Tobacco-Diet     Tobacco Status: never  Current Medications (verified): 1)  Doxepin Hcl 25 Mg Caps (Doxepin Hcl) .... Take 1-2 Capsule By Mouth Every Eight Hours 2)  Enalapril Maleate 10 Mg Tabs (Enalapril Maleate) .... Take 1 Tablet By Mouth Once A Day 3)  Freestyle Lite  Strp (Glucose Blood) .... Use 1 Strip Three Times A Day, 1 Box 4)  Levoxyl 200 Mcg  Tabs (Levothyroxine Sodium) .... One Tablet Daily 5)  Metformin Hcl 1000 Mg Tabs (Metformin Hcl) .... Take 1 Tablet By Mouth Twice A Day 6)  Prozac 20 Mg  Caps (Fluoxetine Hcl) .... 3 Tablets By Mouth Daily 7)  Ativan 1 Mg  Tabs (Lorazepam) .... One By Mouth Two Times A Day As Needed Anxiety 8)  Trazodone Hcl 50 Mg  Tabs (Trazodone Hcl) .... One Tablet By Mouth At Bedtime 9)  Proair Hfa 108 (90 Base) Mcg/act Aers (Albuterol Sulfate) .... As Directed 10)  Fleet Enema 7-19 Gm/110ml Enem (Sodium Phosphates) .Marland Kitchen.. 1 Enema To Rectum As Needed Constipation 11)  Oxycodone-Acetaminophen 5-325 Mg Tabs (Oxycodone-Acetaminophen) .... Three Times  A Day As Needed 12)  Glucometer .... One Use As Directed With Test Strips and Lacets For Use Daily Checks 13)  Bd Ultra-Fine Lancets  Misc (Lancets) .Marland Kitchen.. 1 Box 14)  Ondansetron Hcl 8 Mg Tabs (Ondansetron Hcl) .... One Tab Three Times A Day As Needed For Nausea and Vomiting  Allergies (verified): 1)  ! * Normal Iv Catheter Tips - Pt Can Tolerate Gelco Cath Tips  Past History:  Past Surgical History: BTL 1998 h/o cholecystecomt  Review of Systems General:  Complains of malaise and weakness; denies chills, fever, and sweats. ENT:  Denies nasal congestion and sore throat. Resp:  Denies cough. GI:  Complains of abdominal pain, nausea, and vomiting; denies diarrhea, gas, and indigestion.  Physical Exam  General:  Heaving, Zofran given and in 20 minutes stopped, it was repeated. Ears:  R ear normal and L ear normal.   Mouth:  good dentition and pharynx pink and moist.   Neck:  No deformities, masses, or tenderness noted. Lungs:  normal respiratory effort and normal breath sounds.   Heart:  normal rate and regular rhythm.   Abdomen:  diffusely tender, esp in RLQ.   Impression & Recommendations:  Problem # 1:  NAUSEA AND VOMITING (ICD-787.01) responded quickly to by mouth Zofran, repeated in 30 minutes for a  total of 8 mg.  Urine indicative of dehydration, she is to drink 8 oz per hour until urine is light and clear.  Suspect viral gastroenteritis. Orders: Zofran 4mg  Tab (EMRORAL) [place]- Est  Level 4 (99214)  Problem # 2:  ABDOMINAL PAIN, ACUTE (ICD-789.00) Check labs, s/p chole, some RLQ pain but not consistent with appendicitis at this point, check WBC Orders: Urinalysis-[facility] (00000) Comp Met-[facility] [id number]) CBC w/Diff-[facility] [id number]) [place]- Est  Level 4 [id number])  Problem # 3:  DIABETES MELLITUS, II, COMPLICATIONS (A999333) A1C at 7.7 and spot glucose at 135, has not taken her meds.  Instructed to hold both ACE and metformin until she is drinking/eating and feeling back to  normal. Her updated medication list for this problem includes:    Enalapril Maleate 10 Mg Tabs (Enalapril maleate) .Marland Kitchen... Take 1 tablet by mouth once a day    Metformin Hcl 1000 Mg Tabs (Metformin hcl) .Marland Kitchen... Take 1 tablet by mouth twice a day  Orders: A1C-[facility] [id number]) Glucose Cap-[facility] [id number]) [place]- Est  Level 4 [id number])  Complete Medication List: 1)  Doxepin Hcl 25 Mg Caps (Doxepin hcl) .... Take 1-2 capsule by mouth every eight hours 2)  Enalapril Maleate 10 Mg Tabs (Enalapril maleate) .... Take 1 tablet by mouth once a day 3)  Freestyle Lite Strp (Glucose blood) .... Use 1 strip three times a day, 1 box 4)  Levoxyl 200 Mcg Tabs (Levothyroxine sodium) .... One tablet daily 5)  Metformin Hcl 1000 Mg Tabs (Metformin hcl) .... Take 1 tablet by mouth twice a day 6)  Prozac 20 Mg Caps (Fluoxetine hcl) .... 3 tablets by mouth daily 7)  Ativan 1 Mg Tabs (Lorazepam) .... One by mouth two times a day as needed anxiety 8)  Trazodone Hcl 50 Mg Tabs (Trazodone hcl) .... One tablet by mouth at bedtime 9)  Proair Hfa 108 (90 Base) Mcg/act Aers (Albuterol sulfate) .... As directed 10)  Fleet Enema 7-19 Gm/190ml Enem (Sodium phosphates) .Marland Kitchen.. 1 enema to rectum as needed constipation 11)  Oxycodone-acetaminophen 5-325 Mg Tabs (Oxycodone-acetaminophen) .... Three times a day as needed 12)  Glucometer  .... One use as directed with test strips and lacets for use daily checks 13)  Bd Ultra-fine Lancets Misc (Lancets) .Marland Kitchen.. 1 box 14)  Ondansetron Hcl 8 Mg Tabs (Ondansetron hcl) .... One tab three times a day as needed for nausea and vomiting  Patient Instructions: 1)  You must drink a least 8 ounces every hour 2)  Until your pee is clear 3)  Use Zofran to control your nausea and heaving 4)  Please come back to see me for chronic issues in the next month Prescriptions: ONDANSETRON HCL 8 MG TABS (ONDANSETRON HCL) one tab three times a day as needed for nausea and vomiting  #20 x 0   Entered and Authorized by:    Tereasa Coop NP   Signed by:   Tereasa Coop NP on 01/14/2010   Method used:   Electronically to        [facility] (retail)       [address]       [place], [place]  [address]       Ph: [phone]       Fax: [phone]   RxID:   [id number]    Medication Administration  Injection # 1:    Diagnosis:    Medication # 1:    Medication: Zofran 4mg  Tab  Diagnosis: NAUSEA AND VOMITING (ICD-787.01)    Dose: 8 mg    Route: po    Exp Date: 07/2010    Lot #: [id number]    Mfr: teva     Comments: two 4mg  tab given po    Patient tolerated medication without complications    Given by: Schuyler Amor CMA (January 14, 2010 11:26 AM)  Orders Added: 1)  A1C-[facility] [83036] 2)  Glucose Cap-[facility] [82948] 3)  Urinalysis-[facility] [00000] 4)  Comp Met-[facility] [id number] 5)  CBC w/Diff-[facility] [85025] 6)  Zofran 4mg  Tab [EMRORAL] 7)  [facility]- Est  Level 4 [id number]    Laboratory Results   Urine Tests  Date/Time Received: January 14, 2010 11:35 AM  Date/Time Reported: January 14, 2010 11:46 AM   Routine Urinalysis   Color: yellow Appearance: Clear Glucose: negative   (Normal Range: Negative) Bilirubin: negative   (Normal Range: Negative) Ketone: negative   (Normal Range: Negative) Spec. Gravity: >=1.030   (Normal Range: 1.003-1.035) Blood: negative   (Normal Range: Negative) pH: 5.5   (Normal Range: 5.0-8.0) Protein: negative   (Normal Range: Negative) Urobilinogen: 0.2   (Normal Range: 0-1) Nitrite: negative   (Normal Range: Negative) Leukocyte Esterace: negative   (Normal Range: Negative)    Comments: ...............test performed by......Marland KitchenBonnie A. Martinique, MLS (ASCP)cm   Blood Tests   Date/Time Received: January 14, 2010 11:35 AM  Date/Time Reported: January 14, 2010 11:46 AM   HGBA1C: 7.7%   (Normal Range: Non-Diabetic - 3-6%   Control Diabetic - 6-8%)  Comments: ...............test performed by......Marland KitchenBonnie A. Martinique, MLS  (ASCP)cm

## 2010-04-09 NOTE — Miscellaneous (Signed)
Summary: neds test strips  Clinical Lists Changes she called very upset that she cannot afford her test strips. has insurance & has never purchased them. suggested she call her pharmacy, tell them the name of her meter & ask how much they will be. c/o multiple symptoms. told her she needed to be seen. states she cannot afford that. says she is a single mom with 2 kids and cannot see a doctor. told her the dangers of uncontrolled diabtes and the even higher cost if untreated. asked that she call when she can find time to come in.meantime if she feels bad, exercise vigorously and drink water.Elige Radon RN  August 13, 2009 11:28 AM

## 2010-04-09 NOTE — Letter (Signed)
Summary: Letter, no refills on meds until visit  [facility]  [address]   [place], [place] [address]   Phone: [phone]  Fax: [phone]    12/06/2009  [name] [address] [place], [place]  [address]  Dear [name],   I have provided the prescriptions enclosed.  You must schedule an office visit before any more refills on controlled substances ( oxycodone/APAP and lorazepam)  We also need to update your prevention services, so please come in fasting so that we can get blood work.   Sincerely,   Tereasa Coop NP  Appended Document: Generic Letter called pt. pt agreed to sched. ov for f/up

## 2010-04-09 NOTE — Miscellaneous (Signed)
Summary: dnka at [facility]  Clinical Lists Changes [facility] not [facility].Elige Radon RN  September 20, 2009 4:01 PM

## 2010-04-09 NOTE — Letter (Signed)
Summary: Out of Work  [facility]  [address]   [place], [place] [address]   Phone: [phone]  Fax: [phone]    April 20, 2009   Employee:  VICKIE DSOUZA    To Whom It May Concern:   For Medical reasons, please excuse the above named employee from work for the following dates:  Start:  April 20, 2009   End:  April 20, 2009   If you need additional information, please feel free to contact our office.         Sincerely,    Ria Bush  MD

## 2010-04-09 NOTE — Progress Notes (Signed)
Summary: Emergency Line Call  Phone Note Call from Patient Call back at Home Phone [phone]   Caller: Patient Summary of Call: Pt calling, was trying to clear ears with q-tip and accidentally broke it off inside meatus.  Tried to put water in it to flush it out, did not work, cannot hear from Radiographer, therapeutic.  Mild pain, no bleeding, no discharge, no mastoid tenderness.  Was going to go to the ER, advised to go to [facility], or [facility], pt states she has to work but will try to get time off.  Advised fevers/chills, HA, to back, and that she needs to be seen tomorrow to have foreign object removed.  Pt agrees to be seen tomo. Initial call taken by: Aundria Mems MD,  November 12, 2009 10:44 PM

## 2010-04-09 NOTE — Assessment & Plan Note (Signed)
Summary: sore throat, swollen glands, df   Vital Signs:  Patient profile:   40 year old female Weight:      380 pounds (172.73 kg) Temp:     101.4 degrees F (38.56 degrees C) Pulse rate:   102 / minute BP sitting:   142 / 86  Vitals Entered By: Christen Bame CMA (May 23, 2008 9:51 AM) CC: sore throat   History of Present Illness: Sick since yesterday AM with sorethroat. No runny nose. Coughed only 2 times yesterday. Works at a Corporate treasurer at [employer]. One class has someone with strep throat. 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 Has HA above eyes and in back of neck. Feels chills. Unable to sleep last night. No nausea or vomiting. Dizzy when stands up. Took some Niquil last night. No diarrhea. Urinated every hour last night. No dysuria. No blood in urine. no rash. No SOB. Also having left ear pain that started yesterday AM as well.   Allergies: 1)  ! * Normal Iv Catheter Tips - Pt Can Tolerate Gelco Cath Tips  Physical Exam  General:  alert, well-developed, and well-nourished.  Hoarsish voice  Head:  normocephalic and atraumatic.   Ears:  TMs normal, shiny light refulx bilaterally  Mouth:  Pharynx erythematous with white exudates  Neck:  Tender lymphadenopathy - anterior cervical  Lungs:  normal respiratory effort, normal breath sounds, no crackles, and no wheezes.   Heart:  normal rate, regular rhythm, no murmur, no gallop, and no rub.     Impression & Recommendations:  Problem # 1:  STREPTOCOCCAL PHARYNGITIS (ICD-034.0) Assessment New  Penicillin x 10 days. Son had reaction - rash to this but patient has no known reactions to this. Stay out of work today and tomorrow. May go back once fever is gone.   Her updated medication list for this problem includes:    Penicillin V Potassium 500 Mg Tabs (Penicillin v potassium) .Marland Kitchen... Take one tablet two times a day for 10 days  Orders: [facility]- Est Level  3 [id number])  Complete Medication List: 1)  Doxepin Hcl 25 Mg Caps  (Doxepin hcl) .... Take 1-2 capsule by mouth every eight hours 2)  Enalapril Maleate 10 Mg Tabs (Enalapril maleate) .... Take 1 tablet by mouth once a day 3)  Freestyle Lite Strp (Glucose blood) .... Use 1 strip three times a day 4)  Levoxyl 200 Mcg Tabs (Levothyroxine sodium) .... One tablet daily 5)  Metformin Hcl 1000 Mg Tabs (Metformin hcl) .... Take 1 tablet by mouth twice a day 6)  Prozac 20 Mg Caps (Fluoxetine hcl) .... 3 tablets by mouth daily 7)  Ativan 1 Mg Tabs (Lorazepam) .... One by mouth two times a day as needed anxiety 8)  Trazodone Hcl 50 Mg Tabs (Trazodone hcl) .... One tablet by mouth at bedtime 9)  Zyrtec Allergy 10 Mg Tabs (Cetirizine hcl) .... One daily 10)  Proair Hfa 108 (90 Base) Mcg/act Aers (Albuterol sulfate) .... 2puffs q4hours as needed wheezing 11)  Ferrous Sulfate 324 Mg Tbec (Ferrous sulfate) .... One by mouth two times a day with orange juice 12)  Hydrocodone-acetaminophen 5-500 Mg Tabs (Hydrocodone-acetaminophen) .Marland Kitchen.. 1-2 tablets by mouth two times a day as needed pain. do not use with tylenol. 13)  Penicillin V Potassium 500 Mg Tabs (Penicillin v potassium) .... Take one tablet two times a day for 10 days  Other Orders: Rapid Strep-[facility] [id number])  Patient Instructions: 1)  Take the penicillin 500 mg two times a day x 10 days.  2)  If you have a reaction, let us know and we will change the antibiotic.  3)  Try cepacol losenges for your sore throat.  4)  Stay out of work until your fever breaks.  5)  The medication list was reviewed and reconciled.  All changed / newly prescribed medications were explained.  A complete medication list was provided to the patient / caregiver. Prescriptions: PENICILLIN V POTASSIUM 500 MG TABS (PENICILLIN V POTASSIUM) Take one tablet two times a day for 10 days  #20 x 0   Entered and Authorized by:   Danise Mina MD   Signed by:   Danise Mina MD on 05/23/2008   Method used:   Print then Give to Patient   [id number] PENICILLIN V POTASSIUM 500 MG TABS (PENICILLIN V POTASSIUM) Take one tablet two times a day for 10 days  #20 x 0   Entered and Authorized by:   Danise Mina MD   Signed by:   Danise Mina MD on 05/23/2008   Method used:   Print then Give to Patient   RxID:   [id number]   Laboratory Results  Date/Time Received: May 23, 2008 9:57 AM  Date/Time Reported: May 23, 2008 10:03 AM   Other Tests  Rapid Strep: positive Comments: Christen Bame CMA  May 23, 2008 10:03 AM

## 2010-04-09 NOTE — Progress Notes (Signed)
Summary: needs OV before next RF/TS  ---- Converted from flag ---- ---- 06/01/2009 1:01 PM, Briscoe Deutscher DO wrote: please let this patient know that I filled her Ativan today but she needs to make an appointment to see me about her anxiety. ------------------------------  called pt and lmam with request to make OV before next RF of her meds.
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
## 2010-04-09 NOTE — Miscellaneous (Signed)
Summary: needs cheaper inhaler  Clinical Lists Changes called asking that md change her inhaler to something less expensive. current one is $30. states [facility] on [address] had one for 10. she does not know that name of the med. Very SOB on phone. unable to complete a sentance. asked her to come in asap. she refused stating she could not leave work-just wanted an inhaler. message to pcp.Elige Radon RN  November 22, 2008 8:59 AM     I checked the [facility] $4 list and did not see any inhaler options. Please find out which inhaler to prescribe.   Appended Document: needs cheaper inhaler Contacted [facility] who said they have a $9 product. It is Relion Ventolin and it has the same dosage.  Usually ordered, disp. 3 boxes and pt picks up one at a time. Geanie Cooley RN 11/24/2008  Appended Document: Orders Update    Clinical Lists Changes  Medications: Changed medication from PROAIR HFA 108 (90 BASE) MCG/ACT  AERS (ALBUTEROL SULFATE) 2puffs q4hours as needed wheezing to VENTOLIN HFA 108 (90 BASE) MCG/ACT AERS (ALBUTEROL SULFATE) two puffs q 4-6 hours as needed SOB, wheeze - Signed Rx of VENTOLIN HFA 108 (90 BASE) MCG/ACT AERS (ALBUTEROL SULFATE) two puffs q 4-6 hours as needed SOB, wheeze;  #1 x 11;  Signed;  Entered by: Briscoe Deutscher MD;  Authorized by: Briscoe Deutscher MD;  Method used: Electronically to [facility] [phone] [place], [place], [place]  [address], Ph: [phone], Fax: [phone]    Prescriptions: VENTOLIN HFA 108 (90 BASE) MCG/ACT AERS (ALBUTEROL SULFATE) two puffs q 4-6 hours as needed SOB, wheeze  #1 x 11   Entered and Authorized by:   Briscoe Deutscher MD   Signed by:   Briscoe Deutscher MD on 11/24/2008   Method used:   Electronically to        [facility].* (retail)       [phone]       [place], [place]  [address]       Ph: [phone]       Fax: [phone]   RxIDAN:[id number]    Appended Document: needs cheaper inhaler Message left with family member that new rx has been faxed to [facility].  Geanie Cooley RN 11/24/2008

## 2010-04-09 NOTE — Miscellaneous (Signed)
Summary: Medication changes upon hospital DC  Clinical Lists Changes  Medications: Changed medication from ENALAPRIL MALEATE 10 MG TABS (ENALAPRIL MALEATE) Take 1 tablet by mouth once a day to ENALAPRIL MALEATE 10 MG TABS (ENALAPRIL MALEATE) Take 1/2  tablet by mouth once a day - Signed Added new medication of COLACE 100 MG CAPS (DOCUSATE SODIUM) 1 by mouth 2 times daily as needed for constipation - Signed Rx of ENALAPRIL MALEATE 10 MG TABS (ENALAPRIL MALEATE) Take 1/2  tablet by mouth once a day;  #30 x 3;  Signed;  Entered by: Briscoe Deutscher MD;  Authorized by: Briscoe Deutscher MD;  Method used: Electronically to [facility].*, [phone] [place] [address], [place], [place]  [address], Ph: [phone], Fax: [phone] Rx of COLACE 100 MG CAPS (DOCUSATE SODIUM) 1 by mouth 2 times daily as needed for constipation;  #180 x 3;  Signed;  Entered by: Briscoe Deutscher MD;  Authorized by: Briscoe Deutscher MD;  Method used: Electronically to [facility].*, [phone] [place], [place], [place]  [address], Ph: [phone], Fax: [phone] Rx of METFORMIN HCL 1000 MG TABS (METFORMIN HCL) Take 1 tablet by mouth twice a day;  #60 x 3;  Signed;  Entered by: Briscoe Deutscher MD;  Authorized by: Briscoe Deutscher MD;  Method used: Electronically to [facility] [phone] [place], [place], [place]  [address], Ph: [phone], Fax: [phone] Rx of FREESTYLE LITE  STRP (GLUCOSE BLOOD) Use 1 strip three times a day;  #90 x 3;  Signed;  Entered by: Briscoe Deutscher MD;  Authorized by: Briscoe Deutscher MD;  Method used: Electronically to [facility].*, [phone] [place], [place], [place]  [address], Ph: [phone], Fax: [phone]    Prescriptions: FREESTYLE LITE  STRP (GLUCOSE BLOOD) Use 1 strip three times a day  #90 x 3   Entered and Authorized by:   Briscoe Deutscher MD   Signed by:   Briscoe Deutscher MD on 08/29/2008   Method used:   Electronically to        [facility].* (retail)       [phone]       [place], [place]  [address]       Ph: [phone]       Fax: [phone]   RxID:   [id number] METFORMIN HCL 1000 MG TABS (METFORMIN HCL) Take 1 tablet by mouth twice a day  #60 x 3   Entered and Authorized by:   Briscoe Deutscher MD   Signed by:   Briscoe Deutscher MD on 08/29/2008   Method used:   Electronically to        [facility].* (retail)       [phone]       [place], [place]  [address]       Ph: [phone]       Fax: [phone]   RxID:   [id number] COLACE 100 MG CAPS (DOCUSATE SODIUM) 1 by mouth 2 times daily as needed for constipation  #180 x 3   Entered and Authorized by:   Briscoe Deutscher MD   Signed by:   Briscoe Deutscher MD on 08/29/2008   Method used:   Electronically to        [facility].* (retail)       [phone]  [place], [place]  [address]       Ph: [phone]       Fax: [phone]   [id number] ENALAPRIL MALEATE 10 MG TABS (ENALAPRIL MALEATE) Take 1/2  tablet by mouth once a day  #30 x 3   Entered and Authorized by:   Briscoe Deutscher MD   Signed by:   Briscoe Deutscher MD on 08/29/2008   Method used:   Electronically to        [facility].* (retail)       [phone]       [place], [place]  [address]       Ph: [phone]       Fax: [phone]   RxID:   [id number]

## 2010-04-09 NOTE — Assessment & Plan Note (Signed)
Summary: shot/Pennelope Basque,df  Nurse Visit   Vital Signs:  Patient profile:   40 year old female O2 Sat:      98 % on Room air Temp:     99.9 degrees F oral  Vitals Entered By: Mauricia Area CMA, (January 15, 2010 4:45 PM)  O2 Flow:  Room air  Allergies: 1)  ! * Normal Iv Catheter Tips - Pt Can Tolerate Gelco Cath Tips  Medication Administration  Injection # 1:    Medication: Promethazine up to 50mg     Diagnosis: NAUSEA AND VOMITING (ICD-787.01)    Route: IM    Site: LUOQ gluteus    Exp Date: 08/09/2011    Lot #: [id number]    Mfr: NOVAPLUS    Comments: 25 MG IM GIVEN. PT'S SISTER DROVE PT HOME.    Patient tolerated injection without complications    Given by: Mauricia Area CMA, (January 15, 2010 4:58 PM)  Orders Added: 1)  Promethazine up to 50mg  [J2550] 2)  Est Level 1- [facility] [id number] Prescriptions: POLYETHYLENE GLYCOL 3350  POWD (POLYETHYLENE GLYCOL 3350) 17 GM in water daily for constipation., QS  #1 x 3   Entered and Authorized by:   Tereasa Coop NP   Signed by:   Tereasa Coop NP on 01/15/2010   Method used:   Electronically to        [facility].* (retail)       [phone]       [place], [place]  [address]       Ph: [phone]       Fax: [phone]   RxID:   [id number] FERROUS SULFATE 325 (65 FE) MG TABS (FERROUS SULFATE) one two times a day  #60 x 5   Entered and Authorized by:   Tereasa Coop NP   Signed by:   Tereasa Coop NP on 01/15/2010   Method used:   Electronically to        [facility].* (retail)       [phone]       [place], [place]  [address]       Ph: [phone]       Fax: [phone]   RxID:   [id number]    Medication Administration  Injection # 1:    Medication: Promethazine up to 50mg     Diagnosis: NAUSEA AND VOMITING (ICD-787.01)    Route: IM    Site: LUOQ gluteus    Exp Date: 08/09/2011    Lot #: [id number]    Mfr: NOVAPLUS    Comments: 25  MG IM GIVEN. PT'S SISTER DROVE PT HOME.    Patient tolerated injection without complications    Given by: Mauricia Area CMA, (January 15, 2010 4:58 PM)  Orders Added: 1)  Promethazine up to 50mg  [J2550] 2)  Est Level 1- [facility] [id number]

## 2010-04-10 ENCOUNTER — Encounter: Payer: Self-pay | Admitting: *Deleted

## 2010-04-11 NOTE — Progress Notes (Signed)
Summary: refill  Phone Note Refill Request Call back at Home Phone [phone] Message from:  Patient  Refills Requested: Medication #1:  ATIVAN 1 MG  TABS one by mouth two times a day as needed anxiety [BMN]  Medication #2:  TRAZODONE HCL 50 MG  TABS one tablet by mouth at bedtime [BMN]  Medication #3:  OXYCODONE-ACETAMINOPHEN 5-325 MG TABS three times a day as needed [BMN] Please call when ready- pt is bringing kids 12/22- wants to pick up then  Initial call taken by: Audie Clear,  February 27, 2010 4:29 PM  Follow-up for Phone Call        handwritten and given to patient when she was in clinic with children Follow-up by: Tereasa Coop NP,  February 28, 2010 9:23 AM    New/Updated Medications: ATIVAN 1 MG  TABS (LORAZEPAM) one by mouth two times a day as needed anxiety  Appended Document: refill Fax request from [facility] for Ativan refill was returned with note that she was given a handwritten prescription for this on 02/28/10/

## 2010-04-11 NOTE — Letter (Signed)
Summary: Letter to make an apt.  [facility]  [address]   [place], [place] [address]   Phone: [phone]  Fax: [phone]    03/14/2010  ALYNNE BRADSHAW [address] [place], [place]  [address]  Dear Ms. Placencia,   You will need an appointment with me prior to any further refills of your medicaions.  The last time I saw you for a regular check up was in June.  I also made a referral to GYN clinic in July, and was uncertain if you made that appointment.  Hope to see you soon.     Sincerely,   Tereasa Coop NP  Appended Document: Letter to make an apt. mailed letter to pt

## 2010-05-13 ENCOUNTER — Telehealth: Payer: Self-pay | Admitting: Family Medicine

## 2010-05-13 DIAGNOSIS — F329 Major depressive disorder, single episode, unspecified: Secondary | ICD-10-CM

## 2010-05-13 MED ORDER — FLUOXETINE HCL 20 MG PO CAPS: 20.0000 mg | ORAL_CAPSULE | Freq: Three times a day (TID) | ORAL | Status: DC

## 2010-05-13 NOTE — Telephone Encounter (Signed)
Pt thinks she accidentally threw her antidepressant med in the trash, wants to know what she should do? Pt can be reached after 2 pm today.

## 2010-05-13 NOTE — Telephone Encounter (Signed)
Called pt and told the pt that we would re-send her rx to the pharmacy. Pt said, that she really needs her prozac and feels bad, that she accidentally put it in the trashbag. [name]

## 2010-05-21 LAB — GLUCOSE, CAPILLARY: Glucose-Capillary: 134 mg/dL — ABNORMAL HIGH (ref 70–99)

## 2010-05-27 DIAGNOSIS — N179 Acute kidney failure, unspecified: Secondary | ICD-10-CM

## 2010-05-27 HISTORY — DX: Acute kidney failure, unspecified: N17.9

## 2010-06-02 LAB — COMPREHENSIVE METABOLIC PANEL
BUN: 7 mg/dL (ref 6–23)
CO2: 27 mEq/L (ref 19–32)
Calcium: 8.7 mg/dL (ref 8.4–10.5)
Creatinine, Ser: 0.77 mg/dL (ref 0.4–1.2)
GFR calc non Af Amer: >60 mL/min (ref >60)
Glucose, Bld: 120 mg/dL — ABNORMAL HIGH (ref 70–99)
Total Protein: 7.4 g/dL (ref 6.0–8.3)

## 2010-06-02 LAB — CBC
HCT: 29.7 % — ABNORMAL LOW (ref 36.0–46.0)
Hemoglobin: 9.6 g/dL — ABNORMAL LOW (ref 12.0–15.0)
MCHC: 32.2 g/dL (ref 30.0–36.0)
MCV: 76.2 fL — ABNORMAL LOW (ref 78.0–100.0)
RBC: 3.89 MIL/uL (ref 3.87–5.11)
RDW: 17.5 % — ABNORMAL HIGH (ref 11.5–15.5)

## 2010-06-02 LAB — URINALYSIS, ROUTINE W REFLEX MICROSCOPIC
Bilirubin Urine: NEGATIVE
Glucose, UA: NEGATIVE mg/dL
Hgb urine dipstick: NEGATIVE
Ketones, ur: NEGATIVE mg/dL
Nitrite: NEGATIVE
Specific Gravity, Urine: 1.023 (ref 1.005–1.030)
pH: 5.5 (ref 5.0–8.0)

## 2010-06-02 LAB — DIFFERENTIAL
Basophils Relative: 1 % (ref 0–1)
Eosinophils Absolute: 0.1 10*3/uL (ref 0.0–0.7)
Lymphs Abs: 2.3 10*3/uL (ref 0.7–4.0)
Monocytes Relative: 7 % (ref 3–12)
Neutro Abs: 3.4 10*3/uL (ref 1.7–7.7)
Neutrophils Relative %: 54 % (ref 43–77)

## 2010-06-02 LAB — LIPASE, BLOOD: Lipase: 16 U/L (ref 11–59)

## 2010-06-02 LAB — GLUCOSE, CAPILLARY

## 2010-06-03 LAB — COMPREHENSIVE METABOLIC PANEL
ALT: 22 U/L (ref 0–35)
BUN: 7 mg/dL (ref 6–23)
Calcium: 8.4 mg/dL (ref 8.4–10.5)
Creatinine, Ser: 0.87 mg/dL (ref 0.4–1.2)
Glucose, Bld: 144 mg/dL — ABNORMAL HIGH (ref 70–99)
Sodium: 139 mEq/L (ref 135–145)
Total Protein: 6.3 g/dL (ref 6.0–8.3)

## 2010-06-03 LAB — GLUCOSE, CAPILLARY
Glucose-Capillary: 157 mg/dL — ABNORMAL HIGH (ref 70–99)
Glucose-Capillary: 159 mg/dL — ABNORMAL HIGH (ref 70–99)
Glucose-Capillary: 170 mg/dL — ABNORMAL HIGH (ref 70–99)

## 2010-06-05 ENCOUNTER — Telehealth: Payer: Self-pay | Admitting: *Deleted

## 2010-06-05 ENCOUNTER — Emergency Department (HOSPITAL_COMMUNITY)
Admission: EM | Admit: 2010-06-05 | Discharge: 2010-06-06 | Disposition: A | Discharge status: Other Acute Inpatient Hospital | Payer: BC Managed Care – PPO | Attending: Emergency Medicine | Admitting: Emergency Medicine

## 2010-06-05 DIAGNOSIS — T50902A Poisoning by unspecified drugs, medicaments and biological substances, intentional self-harm, initial encounter: Secondary | ICD-10-CM

## 2010-06-05 HISTORY — DX: Poisoning by unspecified drugs, medicaments and biological substances, intentional self-harm, initial encounter: T50.902A

## 2010-06-05 LAB — RAPID URINE DRUG SCREEN, HOSP PERFORMED
Cocaine: NOT DETECTED
Opiates: NOT DETECTED

## 2010-06-05 LAB — COMPREHENSIVE METABOLIC PANEL
ALT: 98 U/L — ABNORMAL HIGH (ref 0–35)
AST: 363 U/L — ABNORMAL HIGH (ref 0–37)
Alkaline Phosphatase: 55 U/L (ref 39–117)
CO2: 22 mEq/L (ref 19–32)
GFR calc Af Amer: 31 mL/min — ABNORMAL LOW (ref >60)
Glucose, Bld: 238 mg/dL — ABNORMAL HIGH (ref 70–99)
Potassium: 6 mEq/L — ABNORMAL HIGH (ref 3.5–5.1)
Sodium: 138 mEq/L (ref 135–145)
Total Protein: 8.6 g/dL — ABNORMAL HIGH (ref 6.0–8.3)

## 2010-06-05 LAB — ETHANOL: Alcohol, Ethyl (B): <5 mg/dL (ref 0–10)

## 2010-06-05 LAB — CBC
MCHC: 28.7 g/dL — ABNORMAL LOW (ref 30.0–36.0)
Platelets: 559 10*3/uL — ABNORMAL HIGH (ref 150–400)
RDW: 17 % — ABNORMAL HIGH (ref 11.5–15.5)
WBC: 15.2 10*3/uL — ABNORMAL HIGH (ref 4.0–10.5)

## 2010-06-05 LAB — BLOOD GAS, ARTERIAL
Bicarbonate: 19.3 mEq/L — ABNORMAL LOW (ref 20.0–24.0)
Patient temperature: 98.6
TCO2: 17.9 mmol/L (ref 0–100)
pH, Arterial: 7.376 (ref 7.350–7.400)
pO2, Arterial: 147 mmHg — ABNORMAL HIGH (ref 80.0–100.0)

## 2010-06-05 LAB — DIFFERENTIAL
Basophils Absolute: 0 10*3/uL (ref 0.0–0.1)
Eosinophils Absolute: 0 10*3/uL (ref 0.0–0.7)
Lymphocytes Relative: 6 % — ABNORMAL LOW (ref 12–46)
Monocytes Absolute: 1.5 10*3/uL — ABNORMAL HIGH (ref 0.1–1.0)
Neutrophils Relative %: 84 % — ABNORMAL HIGH (ref 43–77)

## 2010-06-05 LAB — GLUCOSE, CAPILLARY: Glucose-Capillary: 242 mg/dL — ABNORMAL HIGH (ref 70–99)

## 2010-06-05 LAB — LACTIC ACID, PLASMA: Lactic Acid, Venous: 6.2 mmol/L — ABNORMAL HIGH (ref 0.5–2.2)

## 2010-06-05 NOTE — Telephone Encounter (Addendum)
"  I don't have another penny, my car broke down & my lawnmower broke. I don't want to be here" No gun in home. No plan but wants to die. But does not want her child to come home from school & find her mom dead. Support system is: Usually talks with Maudie Mercury, a friend . Her brother will let her use his car sometimes. Her mom is dead. Cannot name any other support system or friends. Concerned about money to see md. She promised not to hurt self & will see if a friend will take her to [facility] ED. I told her if no ride found, call 911 & go NOW. She promised. Stated she is still taking her prozac. She cried thru the conversation, but seemed a bit calmer by the end. I asked her to call me back & let me know she was at [facility] & how things are going. She said she would

## 2010-06-05 NOTE — Telephone Encounter (Signed)
Called and left message that I was checking and to make an apt.

## 2010-06-06 ENCOUNTER — Inpatient Hospital Stay (HOSPITAL_COMMUNITY)
Admission: EM | Admit: 2010-06-06 | Discharge: 2010-07-05 | DRG: 582 | Disposition: A | Discharge status: Other Acute Inpatient Hospital | Payer: BC Managed Care – PPO | Source: Other Acute Inpatient Hospital | Attending: Family Medicine | Admitting: Family Medicine

## 2010-06-06 ENCOUNTER — Inpatient Hospital Stay (HOSPITAL_COMMUNITY): Payer: BC Managed Care – PPO

## 2010-06-06 ENCOUNTER — Encounter: Payer: Self-pay | Admitting: Family Medicine

## 2010-06-06 DIAGNOSIS — E119 Type 2 diabetes mellitus without complications: Secondary | ICD-10-CM | POA: Diagnosis present

## 2010-06-06 DIAGNOSIS — F411 Generalized anxiety disorder: Secondary | ICD-10-CM | POA: Diagnosis present

## 2010-06-06 DIAGNOSIS — E872 Acidosis, unspecified: Secondary | ICD-10-CM | POA: Diagnosis present

## 2010-06-06 DIAGNOSIS — D509 Iron deficiency anemia, unspecified: Secondary | ICD-10-CM | POA: Diagnosis present

## 2010-06-06 DIAGNOSIS — Z91040 Latex allergy status: Secondary | ICD-10-CM

## 2010-06-06 DIAGNOSIS — F429 Obsessive-compulsive disorder, unspecified: Secondary | ICD-10-CM | POA: Diagnosis present

## 2010-06-06 DIAGNOSIS — R609 Edema, unspecified: Secondary | ICD-10-CM | POA: Diagnosis present

## 2010-06-06 DIAGNOSIS — E039 Hypothyroidism, unspecified: Secondary | ICD-10-CM | POA: Diagnosis present

## 2010-06-06 DIAGNOSIS — M79609 Pain in unspecified limb: Secondary | ICD-10-CM | POA: Diagnosis present

## 2010-06-06 DIAGNOSIS — E669 Obesity, unspecified: Secondary | ICD-10-CM | POA: Diagnosis present

## 2010-06-06 DIAGNOSIS — T43502A Poisoning by unspecified antipsychotics and neuroleptics, intentional self-harm, initial encounter: Secondary | ICD-10-CM | POA: Diagnosis present

## 2010-06-06 DIAGNOSIS — K7200 Acute and subacute hepatic failure without coma: Secondary | ICD-10-CM | POA: Diagnosis present

## 2010-06-06 DIAGNOSIS — T438X2A Poisoning by other psychotropic drugs, intentional self-harm, initial encounter: Secondary | ICD-10-CM | POA: Diagnosis present

## 2010-06-06 DIAGNOSIS — M6282 Rhabdomyolysis: Secondary | ICD-10-CM

## 2010-06-06 DIAGNOSIS — J309 Allergic rhinitis, unspecified: Secondary | ICD-10-CM | POA: Diagnosis present

## 2010-06-06 DIAGNOSIS — T50992A Poisoning by other drugs, medicaments and biological substances, intentional self-harm, initial encounter: Secondary | ICD-10-CM | POA: Diagnosis present

## 2010-06-06 DIAGNOSIS — F339 Major depressive disorder, recurrent, unspecified: Secondary | ICD-10-CM | POA: Diagnosis present

## 2010-06-06 DIAGNOSIS — T383X1A Poisoning by insulin and oral hypoglycemic [antidiabetic] drugs, accidental (unintentional), initial encounter: Secondary | ICD-10-CM | POA: Diagnosis present

## 2010-06-06 DIAGNOSIS — N179 Acute kidney failure, unspecified: Secondary | ICD-10-CM

## 2010-06-06 DIAGNOSIS — T43011A Poisoning by tricyclic antidepressants, accidental (unintentional), initial encounter: Principal | ICD-10-CM | POA: Diagnosis present

## 2010-06-06 DIAGNOSIS — I10 Essential (primary) hypertension: Secondary | ICD-10-CM | POA: Diagnosis present

## 2010-06-06 DIAGNOSIS — F410 Panic disorder [episodic paroxysmal anxiety] without agoraphobia: Secondary | ICD-10-CM | POA: Diagnosis present

## 2010-06-06 DIAGNOSIS — E875 Hyperkalemia: Secondary | ICD-10-CM | POA: Diagnosis present

## 2010-06-06 HISTORY — DX: Rhabdomyolysis: M62.82

## 2010-06-06 LAB — CARDIAC PANEL(CRET KIN+CKTOT+MB+TROPI)
CK, MB: 675.3 ng/mL (ref 0.3–4.0)
Total CK: >50000 U/L — ABNORMAL HIGH (ref 7–177)
Total CK: >50000 U/L — ABNORMAL HIGH (ref 7–177)
Total CK: >50000 U/L — ABNORMAL HIGH (ref 7–177)
Troponin I: 0.35 ng/mL — ABNORMAL HIGH (ref 0.00–0.06)
Troponin I: 0.41 ng/mL — ABNORMAL HIGH (ref 0.00–0.06)
Troponin I: 0.33 ng/mL — ABNORMAL HIGH (ref 0.00–0.06)

## 2010-06-06 LAB — COMPREHENSIVE METABOLIC PANEL
Albumin: 2.7 g/dL — ABNORMAL LOW (ref 3.5–5.2)
BUN: 30 mg/dL — ABNORMAL HIGH (ref 6–23)
BUN: 32 mg/dL — ABNORMAL HIGH (ref 6–23)
CO2: 19 mEq/L (ref 19–32)
Calcium: 7.4 mg/dL — ABNORMAL LOW (ref 8.4–10.5)
Chloride: 106 mEq/L (ref 96–112)
Creatinine, Ser: 3.97 mg/dL — ABNORMAL HIGH (ref 0.4–1.2)
Creatinine, Ser: 4.64 mg/dL — ABNORMAL HIGH (ref 0.4–1.2)
GFR calc non Af Amer: 10 mL/min — ABNORMAL LOW (ref >60)
Glucose, Bld: 230 mg/dL — ABNORMAL HIGH (ref 70–99)
Glucose, Bld: 205 mg/dL — ABNORMAL HIGH (ref 70–99)
Total Bilirubin: 0.4 mg/dL (ref 0.3–1.2)
Total Protein: 6.8 g/dL (ref 6.0–8.3)
Total Protein: 6.4 g/dL (ref 6.0–8.3)

## 2010-06-06 LAB — BASIC METABOLIC PANEL
BUN: 24 mg/dL — ABNORMAL HIGH (ref 6–23)
CO2: 18 mEq/L — ABNORMAL LOW (ref 19–32)
Chloride: 108 mEq/L (ref 96–112)
Creatinine, Ser: 3.23 mg/dL — ABNORMAL HIGH (ref 0.4–1.2)
Glucose, Bld: 228 mg/dL — ABNORMAL HIGH (ref 70–99)
Potassium: 5 mEq/L (ref 3.5–5.1)

## 2010-06-06 LAB — CBC
HCT: 32.1 % — ABNORMAL LOW (ref 36.0–46.0)
Hemoglobin: 9.7 g/dL — ABNORMAL LOW (ref 12.0–15.0)
MCH: 21.6 pg — ABNORMAL LOW (ref 26.0–34.0)
MCV: 71.5 fL — ABNORMAL LOW (ref 78.0–100.0)
Platelets: 588 10*3/uL — ABNORMAL HIGH (ref 150–400)
RBC: 4.49 MIL/uL (ref 3.87–5.11)
WBC: 13.3 10*3/uL — ABNORMAL HIGH (ref 4.0–10.5)

## 2010-06-06 LAB — HEPATIC FUNCTION PANEL
ALT: 239 U/L — ABNORMAL HIGH (ref 0–35)
AST: 980 U/L — ABNORMAL HIGH (ref 0–37)
Albumin: 3.5 g/dL (ref 3.5–5.2)
Alkaline Phosphatase: 54 U/L (ref 39–117)
Bilirubin, Direct: 0.1 mg/dL (ref 0.0–0.3)
Total Bilirubin: 0.5 mg/dL (ref 0.3–1.2)
Total Protein: 8.2 g/dL (ref 6.0–8.3)

## 2010-06-06 LAB — CREATININE, URINE, RANDOM: Creatinine, Urine: 111.9 mg/dL

## 2010-06-06 LAB — SALICYLATE LEVEL: Salicylate Lvl: <4 mg/dL (ref 2.8–20.0)

## 2010-06-06 LAB — URINALYSIS, ROUTINE W REFLEX MICROSCOPIC
Ketones, ur: 15 mg/dL — AB
Protein, ur: >300 mg/dL — AB
Urobilinogen, UA: 0.2 mg/dL (ref 0.0–1.0)

## 2010-06-06 LAB — PROTIME-INR
INR: 1.25 (ref 0.00–1.49)
Prothrombin Time: 15.3 seconds — ABNORMAL HIGH (ref 11.6–15.2)
Prothrombin Time: 15.9 seconds — ABNORMAL HIGH (ref 11.6–15.2)

## 2010-06-06 LAB — LACTIC ACID, PLASMA: Lactic Acid, Venous: 4.5 mmol/L — ABNORMAL HIGH (ref 0.5–2.2)

## 2010-06-06 LAB — URINE MICROSCOPIC-ADD ON

## 2010-06-06 LAB — SODIUM, URINE, RANDOM: Sodium, Ur: 87 mEq/L

## 2010-06-06 NOTE — H&P (Signed)
[facility] Admission History and Physical  Patient name: [name] Medical record number: [id number] Date of birth: 04/15/1970 Age: 40 y.o. Gender: female  Primary Care Provider: Tereasa Coop, NP, NP  Chief Complaint: Overdose  History of Present Illness: [name] is a 40 y.o. year old female presenting with overdose.  Decided that life stressors, the bills, the Lucianne Lei breaking down, and other stressors were just too much and she would rather kill herself.  She states, " I have been so depressed and so sad.   I decided that  I just can't do it anymore."  Had active plan to overdose on medications- took Doxepin 50-60 tablets and metformin 4 tablets.  Wanted to go to sleep and not wake up.  Brother found her on the floor, pt remembers brother talking to her.  He called ems.  Pt states that she felt sleepy but has had no other side effects of taking above medications. No HI.  Continues to have SI.  Unsure if she would make another attempt if allowed to go home.  Has never had a suicide attempt in the past.  Pt states that she has pain and numbness all the way up right leg- numbness is present (per pt) in entire right let.    No fever. No sob. No cp.  No edema.  No auditoy or visual hallucinations.  Patient Active Problem List  Diagnoses  . HYPOTHYROIDISM, UNSPECIFIED  . DIABETES MELLITUS, II, COMPLICATIONS  . OBESITY, NOS  . ANEMIA, IRON DEFICIENCY, UNSPEC.  Marland Kitchen DEPRESSION, MAJOR, RECURRENT  . ANXIETY  . OBSESSIVE COMPUL. DISORDER  . HYPERTENSION  . ALLERGIC RHINITIS  . GALLSTONES  . ENDOMETRIAL HYPERPLASIA UNSPECIFIED  . MENORRHAGIA  . NAUSEA AND VOMITING  . ABDOMINAL PAIN, ACUTE   Past Medical History: As per above problem list  Past Surgical History: No surgeries  Social History: Pt lives with son who is 48.  And brother lives with pt off and on.   Pt does not smoke.  Occasional etoh use.  Denies drug use.   Family History: Mother- lupus,  HTN, eye problem Father- HTN Siblings- sister- MS, brother- healthy  Allergies: Latex gloves  Medications: As per below med list.  Review Of Systems: Per HPI   Physical Exam: Pulse: 115  Blood Pressure: 135/83 RR: 18   O2: 99% on RA Temp: 98.5  General: cooperative and appears stated age, + drowsiness HEENT: PERRLA, mild thyromegaly Heart: regular rate and rhythm, + systolic murmur Lungs: clear to auscultation, no wheezes or rales and labored breathing Abdomen: abdomen is soft without significant tenderness, masses, organomegaly or guarding Extremities: extremities normal, atraumatic, no cyanosis or edema,  Skin:no rashes Neurology: CN II-XII grossly intact,following all commands, drowsy but awake and interactive,  Strength 5/5 in all four ext, equal bilateral  Labs and Imaging: Lab Results  Component Value Date/Time   NA 138 06/05/2010  6:57 PM   K 6.0* 06/05/2010  6:57 PM   CL 105 06/05/2010  6:57 PM   CO2 22 06/05/2010  6:57 PM   BUN 17 06/05/2010  6:57 PM   CREATININE 2.14* 06/05/2010  6:57 PM   GLUCOSE 238* 06/05/2010  6:57 PM   Lab Results  Component Value Date   WBC 15.2* 06/05/2010   HGB 10.0* 06/05/2010   HCT 34.9* 06/05/2010   MCV 73.6* 06/05/2010   PLT 559* 06/05/2010      Assessment and Plan: [name] is a 40 y.o. year old  female presenting with overdose.    1. Overdose- will continue mucomyst protocol per poison control recommendations.  Will keep pt on monitor.  Will monitor drowsiness level and neuro exam.  Will hold all sedating mediations.  Will need to obtain psych consult 2. Lactic acidosis- will recheck lactic acid level to make sure that it is responding to mucomyst.  3. Hyperkalemia- not treated in ER, will recheck with stat bmet to ensure that it is a true elevation.  Will treat with kayexylate if a true elevation on stat bmet 4. Diabetes- will monitor CBG's closely.  Will cover with sensitive slideing scale insulin.  5. Hypothyroidism- will  continue levoxyl per home regimen. 6. Tachycardia- will monitor on telemetry will give IV fluids.  Most likely due to dehydration.  Will treat will IV fluids to see if this helps bring HR to wnl.  7. Right leg pain- located to right leg, no red flags for neurologic involvement. Will monitor neuro exam.  8. Acute renal failure- baseline creatinine 0.80-  Currently creatinine greater than 2.  Will monitor closely and insure that pt is receiving need po hydration. Will calculate fena.  Need to monitor CK level to insure that rhabdo is not causing renal failure. Will give fluids at 125cc /hr.  9. FEN/GI: carb modified diet 10. prophylaxis: heparin SQ TID 11. Disposition: pending clinical improvement- psych consult

## 2010-06-06 NOTE — Telephone Encounter (Signed)
Patient ended up taking pills at home and was admitted to the hospital with acute lactic acidosis and renal failure.

## 2010-06-07 ENCOUNTER — Other Ambulatory Visit (HOSPITAL_COMMUNITY): Payer: BC Managed Care – PPO

## 2010-06-07 ENCOUNTER — Inpatient Hospital Stay (HOSPITAL_COMMUNITY): Payer: BC Managed Care – PPO

## 2010-06-07 DIAGNOSIS — R609 Edema, unspecified: Secondary | ICD-10-CM

## 2010-06-07 LAB — BLOOD GAS, ARTERIAL
Acid-base deficit: 7.7 mmol/L — ABNORMAL HIGH (ref 0.0–2.0)
Bicarbonate: 17.8 mEq/L — ABNORMAL LOW (ref 20.0–24.0)
FIO2: 0.21 %
FIO2: 0.21 %
O2 Saturation: 94.1 %
O2 Saturation: 94.6 %
Patient temperature: 98.6
Patient temperature: 98.6
TCO2: 18.7 mmol/L (ref 0–100)
pH, Arterial: 7.382 (ref 7.350–7.400)
pO2, Arterial: 77.5 mmHg — ABNORMAL LOW (ref 80.0–100.0)

## 2010-06-07 LAB — COMPREHENSIVE METABOLIC PANEL
ALT: 250 U/L — ABNORMAL HIGH (ref 0–35)
Albumin: 2.6 g/dL — ABNORMAL LOW (ref 3.5–5.2)
CO2: 18 mEq/L — ABNORMAL LOW (ref 19–32)
Calcium: 7.1 mg/dL — ABNORMAL LOW (ref 8.4–10.5)
Calcium: 7.6 mg/dL — ABNORMAL LOW (ref 8.4–10.5)
Creatinine, Ser: 5.36 mg/dL — ABNORMAL HIGH (ref 0.4–1.2)
GFR calc Af Amer: 9 mL/min — ABNORMAL LOW (ref >60)
GFR calc non Af Amer: 9 mL/min — ABNORMAL LOW (ref >60)
Glucose, Bld: 223 mg/dL — ABNORMAL HIGH (ref 70–99)
Glucose, Bld: 140 mg/dL — ABNORMAL HIGH (ref 70–99)
Sodium: 139 mEq/L (ref 135–145)
Sodium: 145 mEq/L (ref 135–145)
Total Protein: 5.8 g/dL — ABNORMAL LOW (ref 6.0–8.3)
Total Protein: 6.6 g/dL (ref 6.0–8.3)

## 2010-06-07 LAB — HEMOGLOBIN A1C
Hgb A1c MFr Bld: 8.6 % — ABNORMAL HIGH (ref <5.7)
Mean Plasma Glucose: 200 mg/dL — ABNORMAL HIGH (ref <117)

## 2010-06-07 LAB — CBC
HCT: 25.5 % — ABNORMAL LOW (ref 36.0–46.0)
Hemoglobin: 8.1 g/dL — ABNORMAL LOW (ref 12.0–15.0)
MCH: 21.7 pg — ABNORMAL LOW (ref 26.0–34.0)
MCHC: 31.8 g/dL (ref 30.0–36.0)
RDW: 16.9 % — ABNORMAL HIGH (ref 11.5–15.5)

## 2010-06-07 LAB — CLOSTRIDIUM DIFFICILE BY PCR: Toxigenic C. Difficile by PCR: NEGATIVE

## 2010-06-07 LAB — PHOSPHORUS: Phosphorus: 2.5 mg/dL (ref 2.3–4.6)

## 2010-06-07 LAB — GLUCOSE, CAPILLARY
Glucose-Capillary: 251 mg/dL — ABNORMAL HIGH (ref 70–99)
Glucose-Capillary: 179 mg/dL — ABNORMAL HIGH (ref 70–99)

## 2010-06-07 LAB — MAGNESIUM: Magnesium: 1.9 mg/dL (ref 1.5–2.5)

## 2010-06-08 ENCOUNTER — Inpatient Hospital Stay (HOSPITAL_COMMUNITY): Payer: BC Managed Care – PPO

## 2010-06-08 DIAGNOSIS — I120 Hypertensive chronic kidney disease with stage 5 chronic kidney disease or end stage renal disease: Secondary | ICD-10-CM

## 2010-06-08 DIAGNOSIS — N186 End stage renal disease: Secondary | ICD-10-CM

## 2010-06-08 LAB — RENAL FUNCTION PANEL
CO2: 20 mEq/L (ref 19–32)
Calcium: 7.4 mg/dL — ABNORMAL LOW (ref 8.4–10.5)
Creatinine, Ser: 7.81 mg/dL — ABNORMAL HIGH (ref 0.4–1.2)
GFR calc Af Amer: 7 mL/min — ABNORMAL LOW (ref >60)
Glucose, Bld: 182 mg/dL — ABNORMAL HIGH (ref 70–99)

## 2010-06-08 LAB — GLUCOSE, CAPILLARY
Glucose-Capillary: 257 mg/dL — ABNORMAL HIGH (ref 70–99)
Glucose-Capillary: 219 mg/dL — ABNORMAL HIGH (ref 70–99)
Glucose-Capillary: 266 mg/dL — ABNORMAL HIGH (ref 70–99)
Glucose-Capillary: 220 mg/dL — ABNORMAL HIGH (ref 70–99)

## 2010-06-08 LAB — CBC
HCT: 24.1 % — ABNORMAL LOW (ref 36.0–46.0)
Hemoglobin: 7.5 g/dL — ABNORMAL LOW (ref 12.0–15.0)
MCH: 21.4 pg — ABNORMAL LOW (ref 26.0–34.0)
MCHC: 31.1 g/dL (ref 30.0–36.0)

## 2010-06-08 LAB — SURGICAL PCR SCREEN: Staphylococcus aureus: NEGATIVE

## 2010-06-08 LAB — TSH: TSH: 1.995 u[IU]/mL (ref 0.350–4.500)

## 2010-06-09 LAB — GLUCOSE, CAPILLARY
Glucose-Capillary: 199 mg/dL — ABNORMAL HIGH (ref 70–99)
Glucose-Capillary: 169 mg/dL — ABNORMAL HIGH (ref 70–99)

## 2010-06-09 LAB — CBC
HCT: 23.1 % — ABNORMAL LOW (ref 36.0–46.0)
MCH: 21.3 pg — ABNORMAL LOW (ref 26.0–34.0)
MCHC: 30.7 g/dL (ref 30.0–36.0)
MCV: 69.4 fL — ABNORMAL LOW (ref 78.0–100.0)
RDW: 17.1 % — ABNORMAL HIGH (ref 11.5–15.5)
WBC: 10.4 10*3/uL (ref 4.0–10.5)

## 2010-06-09 LAB — HEPATIC FUNCTION PANEL
AST: 303 U/L — ABNORMAL HIGH (ref 0–37)
Bilirubin, Direct: <0.1 mg/dL (ref 0.0–0.3)
Total Bilirubin: 0.4 mg/dL (ref 0.3–1.2)

## 2010-06-09 LAB — RENAL FUNCTION PANEL
BUN: 37 mg/dL — ABNORMAL HIGH (ref 6–23)
Calcium: 7.5 mg/dL — ABNORMAL LOW (ref 8.4–10.5)
Glucose, Bld: 148 mg/dL — ABNORMAL HIGH (ref 70–99)
Phosphorus: 3.7 mg/dL (ref 2.3–4.6)
Sodium: 139 mEq/L (ref 135–145)

## 2010-06-09 LAB — CK: Total CK: 34960 U/L — ABNORMAL HIGH (ref 7–177)

## 2010-06-10 ENCOUNTER — Inpatient Hospital Stay (HOSPITAL_COMMUNITY): Payer: BC Managed Care – PPO

## 2010-06-10 LAB — CBC
Hemoglobin: 9.4 g/dL — ABNORMAL LOW (ref 12.0–15.0)
MCHC: 31.3 g/dL (ref 30.0–36.0)
MCV: 75.2 fL — ABNORMAL LOW (ref 78.0–100.0)
Platelets: 434 10*3/uL — ABNORMAL HIGH (ref 150–400)
RDW: 16.9 % — ABNORMAL HIGH (ref 11.5–15.5)
RDW: 17 % — ABNORMAL HIGH (ref 11.5–15.5)
WBC: 9.6 10*3/uL (ref 4.0–10.5)

## 2010-06-10 LAB — COMPREHENSIVE METABOLIC PANEL
ALT: 109 U/L — ABNORMAL HIGH (ref 0–35)
ALT: 13 U/L (ref 0–35)
AST: 20 U/L (ref 0–37)
Albumin: 2.4 g/dL — ABNORMAL LOW (ref 3.5–5.2)
Albumin: 3.2 g/dL — ABNORMAL LOW (ref 3.5–5.2)
Alkaline Phosphatase: 47 U/L (ref 39–117)
Alkaline Phosphatase: 37 U/L — ABNORMAL LOW (ref 39–117)
CO2: 28 mEq/L (ref 19–32)
Chloride: 102 mEq/L (ref 96–112)
GFR calc Af Amer: >60 mL/min (ref >60)
Potassium: 3.4 mEq/L — ABNORMAL LOW (ref 3.5–5.1)
Potassium: 4.4 mEq/L (ref 3.5–5.1)
Sodium: 137 mEq/L (ref 135–145)
Sodium: 137 mEq/L (ref 135–145)
Total Bilirubin: 0.5 mg/dL (ref 0.3–1.2)
Total Protein: 5.9 g/dL — ABNORMAL LOW (ref 6.0–8.3)

## 2010-06-10 LAB — DIFFERENTIAL
Basophils Absolute: 0.1 10*3/uL (ref 0.0–0.1)
Basophils Relative: 1 % (ref 0–1)
Eosinophils Absolute: 0.2 10*3/uL (ref 0.0–0.7)
Eosinophils Relative: 3 % (ref 0–5)
Lymphocytes Relative: 37 % (ref 12–46)
Monocytes Absolute: 0.6 10*3/uL (ref 0.1–1.0)

## 2010-06-10 LAB — GLUCOSE, CAPILLARY
Glucose-Capillary: 145 mg/dL — ABNORMAL HIGH (ref 70–99)
Glucose-Capillary: 140 mg/dL — ABNORMAL HIGH (ref 70–99)

## 2010-06-10 LAB — URINALYSIS, ROUTINE W REFLEX MICROSCOPIC
Bilirubin Urine: NEGATIVE
Ketones, ur: NEGATIVE mg/dL
Nitrite: NEGATIVE
Urobilinogen, UA: 1 mg/dL (ref 0.0–1.0)
pH: 6 (ref 5.0–8.0)

## 2010-06-10 LAB — STOOL CULTURE

## 2010-06-11 LAB — GLUCOSE, CAPILLARY
Glucose-Capillary: 163 mg/dL — ABNORMAL HIGH (ref 70–99)
Glucose-Capillary: 135 mg/dL — ABNORMAL HIGH (ref 70–99)

## 2010-06-11 LAB — BASIC METABOLIC PANEL
BUN: 38 mg/dL — ABNORMAL HIGH (ref 6–23)
CO2: 30 mEq/L (ref 19–32)
Chloride: 92 mEq/L — ABNORMAL LOW (ref 96–112)
Potassium: 3.7 mEq/L (ref 3.5–5.1)

## 2010-06-11 LAB — CK TOTAL AND CKMB (NOT AT ARMC): Total CK: 11738 U/L — ABNORMAL HIGH (ref 7–177)

## 2010-06-11 LAB — PHOSPHORUS: Phosphorus: 4.2 mg/dL (ref 2.3–4.6)

## 2010-06-11 LAB — MAGNESIUM: Magnesium: 1.7 mg/dL (ref 1.5–2.5)

## 2010-06-12 ENCOUNTER — Inpatient Hospital Stay (HOSPITAL_COMMUNITY): Payer: BC Managed Care – PPO

## 2010-06-12 DIAGNOSIS — F331 Major depressive disorder, recurrent, moderate: Secondary | ICD-10-CM

## 2010-06-12 LAB — GLUCOSE, CAPILLARY
Glucose-Capillary: 92 mg/dL (ref 70–99)
Glucose-Capillary: 166 mg/dL — ABNORMAL HIGH (ref 70–99)

## 2010-06-12 LAB — COMPREHENSIVE METABOLIC PANEL
CO2: 31 mEq/L (ref 19–32)
Calcium: 7.9 mg/dL — ABNORMAL LOW (ref 8.4–10.5)
Creatinine, Ser: 11.31 mg/dL — ABNORMAL HIGH (ref 0.4–1.2)
GFR calc Af Amer: 5 mL/min — ABNORMAL LOW (ref >60)
GFR calc non Af Amer: 4 mL/min — ABNORMAL LOW (ref >60)
Glucose, Bld: 175 mg/dL — ABNORMAL HIGH (ref 70–99)

## 2010-06-12 LAB — CBC
HCT: 23.8 % — ABNORMAL LOW (ref 36.0–46.0)
Hemoglobin: 7.2 g/dL — ABNORMAL LOW (ref 12.0–15.0)
MCH: 21.6 pg — ABNORMAL LOW (ref 26.0–34.0)
MCHC: 30.3 g/dL (ref 30.0–36.0)

## 2010-06-12 LAB — PHOSPHORUS: Phosphorus: 5 mg/dL — ABNORMAL HIGH (ref 2.3–4.6)

## 2010-06-12 LAB — BASIC METABOLIC PANEL
BUN: 23 mg/dL (ref 6–23)
Calcium: 7.5 mg/dL — ABNORMAL LOW (ref 8.4–10.5)
GFR calc non Af Amer: 6 mL/min — ABNORMAL LOW (ref >60)
Glucose, Bld: 193 mg/dL — ABNORMAL HIGH (ref 70–99)

## 2010-06-13 DIAGNOSIS — F331 Major depressive disorder, recurrent, moderate: Secondary | ICD-10-CM

## 2010-06-13 LAB — GLUCOSE, CAPILLARY
Glucose-Capillary: 148 mg/dL — ABNORMAL HIGH (ref 70–99)
Glucose-Capillary: 157 mg/dL — ABNORMAL HIGH (ref 70–99)
Glucose-Capillary: 184 mg/dL — ABNORMAL HIGH (ref 70–99)
Glucose-Capillary: 168 mg/dL — ABNORMAL HIGH (ref 70–99)

## 2010-06-13 NOTE — Consult Note (Signed)
NAMELASHARRA, LUNDY NO.:  [id number]  MEDICAL RECORD NO.:  [id number]           PATIENT TYPE:  E  LOCATION:  WLED                         FACILITY:  [facility]  PHYSICIAN:  Elmarie Shiley, MD          DATE OF BIRTH:  10-03-1970  DATE OF CONSULTATION:  06/06/2010 DATE OF DISCHARGE:  06/06/2010                                CONSULTATION   Nephrology is consulted by Dr. Dalbert Mayotte of [facility] for the evaluation and management of [name] for her acute renal failure, metabolic acidosis, and polydrug overdose.  PRIMARY CARE PROVIDER:  [facility], she sees Tereasa Coop, nurse practitioner.  HISTORY OF PRESENTING ILLNESS:  [name] is a 40 year old African American woman with past medical history significant for type 2 diabetes mellitus, hypertension, chronic anxiety and depression, and a normal renal baseline (EGFR greater than 60 and negative for proteinuria as of March 2011).  She was transferred from [facility] to [facility] earlier this morning at 2:00 a.m. with a suicidal attempt after ingesting about 50-60 tablets of doxepin and 4 tablets of metformin and a few handful tablets of Percocet.  She states that she was overwhelmed by her social stressors and decided to take her own life.  Since her admission to the hospital, she has undergone aggressive intravenous fluid resuscitation with isotonic bicarbonate as well as normal saline amounting to about 5 L; however, concern is raised as urine output is only 300 mL.  Also of concern, is her rising creatinine which on admission was 2.1 and has gradually risen up to 4.6.  She had hyperkalemia on admission of 6.0 with associated hyperglycemia and this has improved status post management of her sugars as well as intravenous bicarbonate therapy to 4.1.  She denies any prior history of renal insufficiency, denies any hematuria, denies any foamy urine, and denies  history of kidney stones.  She states she has been diabetic for about 13 years and does not have any knowledge of retinopathy as she has not ever followed with an ophthalmologist.  Given her metformin use/overdose, lactic acid levels were monitored and on admission, her lactic acid level was 6.2 millimoles per liter.  Admission ABG was significant for a pH of 7.38 with a pCO2 of 34.  Consequent lactic acid level that was drawn earlier today was 4.5 millimoles per liter.  Repeat arterial blood gas is pending.  Lab data that has been obtained so far is also significant for creatinine kinase level of greater than 50,000, which has remained persistent over the last 12 hours or so.  With regards to her doxepin overdose, she has been on telemetry monitoring, which has been only significant for tachycardia that apparently looks sinus.  She does have elevation of her cardiac enzymes, however, is an elevation of all CK, CK- MB, and troponin-I.  The trend at this time is that of her rising trend.  PAST MEDICAL HISTORY: 1. Type 2 diabetes mellitus. 2. Hypertension. 3. Obesity. 4. Anxiety/depression. 5. Hypothyroidism. 6. History of cholelithiasis. 7. Obsessive compulsive disorder. 8. Menorrhagia. 9.  Anemia of iron deficiency. 10.Allergic rhinitis.  MEDICATIONS: 1. Enalapril 10 mg p.o. daily. 2. Metformin 1000 mg p.o. b.i.d. 3. Levothyroxine 200 mcg p.o. daily. 4. Doxepin 25 mg p.o. q.8 h p.r.n. 5. Lorazepam 1 mg p.o. b.i.d. 6. Percocet 1 p.o. q.6 h p.r.n. 7. Albuterol inhaler 2 puffs q.4 h p.r.n. 8. Fluoxetine 60 mg p.o. daily. 9. Trazodone 50 mg p.o. nightly.  ALLERGIES:  States a cutaneous intolerance to LATEX.  SOCIAL HISTORY:  Single, resides at home with her son who is 36 years of age.  Occasionally, her brother checks in on her and he is the one who found her.  Denies any tobacco use or illicit drug abuse.  Has occasional alcohol use.  FAMILY HISTORY:  Mother has a history  of lupus, hypertension, and ocular complications from lupus.  Father with a history of hypertension.  No significant history of kidney disease in the family.  REVIEW OF SYSTEMS:  Pertinent positives listed in the HPI above. Currently, she is oriented to time, person, and place and appears remorseful for previous actions.  Denies any nausea, vomiting, or diarrhea and has just had part of her dinner.  PHYSICAL EXAMINATION:  VITAL SIGNS:  Temperature of 99.1 degrees Fahrenheit, heart rate of 117, blood pressure 136/83, oxygen saturation of 100% on room air. GENERAL:  Obese, young [place] American woman, resting comfortably in bed. HEAD, NECK, AND ENT:  Head is normocephalic and atraumatic.  Pupils are bilaterally equal and reactive to light.  Extraocular muscle movements are normal.  Funduscopy is not done. NECK:  Supple, obese.  No obvious goiter.  No lymphadenopathy.  No bruits. CARDIOVASCULAR:  Pulse is regular.  Tachycardia.  Heart sounds S1 and S2 accompanied by an ejection systolic murmur. RESPIRATORY:  Both lung fields are clear to auscultation.  No rales, retractions, or rhonchi. ABDOMEN:  Soft, obese, nontender without any organomegaly and bowel sounds are normal. LOWER EXTREMITIES:  The right lower extremity is tender, however, pulses are symmetrical bilaterally.  The extremity does not have any erythematous or focal fluctuance.  There is also no gross deformity with this extremity.  No edema is palpable. CNS:  The patient is oriented to time, person, and place, and grossly normal sensory and motor exam.  LABORATORY DATA:  Sodium 140, potassium 4.1, bicarbonate 19, BUN 32, creatinine 4.6, glucose 205, calcium 7.4, albumin 2.6, AST 797, ALT 281, alkaline phosphatase 51.  CPK level greater than 50,000, CK-MB 466, troponin-I 0.33.  Tylenol level undetectable.  Fractional excretion of sodium 2.5%.  Urine microscopy reveals 3-6 red cells, 3-6 white cells with a few bacteria.   Urinalysis reveals an urine specific gravity of 1.020, pH of 6, urine glucose 250, protein of greater than 300, large amount of blood, negative nitrite.  Salicylic acid level is less than 4. Urine drug screen is negative.  ASSESSMENT AND PLAN: 1. Acute renal failure.  Suspect that this is predominantly a pigment     nephropathy from her overdose-associated rhabdomyolysis in the     patient was recently on ACE inhibitor therapy.  She denies any     intercurrent NSAID use.  I agree with the intravenous fluids that     are currently being given and would also attempt force diuresis     with an intravenous loop diuretic.  Given her oliguria as well as     the limited response to intravenous fluid so far, I anticipate the     requirement for renal replacement therapy in the form  of     intermittent hemodialysis in the next 24-48 hours to help manage     associated electrolyte problems as well as volume problems.  Her     current bicarbonate level and pH level, does not warrant     hemodialysis for metformin toxicity given the paucity of her     intake.  Doxepin with this characteristics of a tricyclic     antidepressant versus anticholinergic is also not managed through     dialysis.  Agree with the intravenous bicarbonate drip that she is     currently getting.  Tylenol levels are appearing undetectable at     this time.  Would monitor her closely for the biphasic spike in her     CPK from compartment syndrome, however, at this time, her right leg     appears clinically normal. 2. Polydrug overdose.  Currently, undergoing intravenous bicarbonate     drip.  We will continue to monitor for now.  We will keep a close     watch on her for dysrhythmias. 3. Anion gap metabolic acidosis with mild metabolic alkalosis.     Suspect that this is primary from acute renal insufficiency with a     small element of lactic acidosis and with a delta-delta gap from     ongoing bicarbonate therapy. 4.  Suicidal ideation.  Currently, appears remorseful and without plans     on repeating her actions.  Management further as per primary care     team.     Elmarie Shiley, MD     JP/MEDQ  D:  06/06/2010  T:  06/07/2010  Job:  [id number]  cc:   Tereasa Coop, NP  Electronically Signed by Elmarie Shiley MD on 06/13/2010 03:52:20 PM

## 2010-06-13 NOTE — Consult Note (Signed)
NAMEJAIMI, [name]               ACCOUNT NO.:  [id number]  MEDICAL RECORD NO.:  [id number]           PATIENT TYPE:  I  LOCATION:  [id number]                         FACILITY:  [facility]  PHYSICIAN:  Marlou Sa, MD DATE OF BIRTH:  Jun 25, 1970  DATE OF CONSULTATION:  06/12/2010 DATE OF DISCHARGE:                                CONSULTATION   HISTORY OF PRESENT ILLNESS:  I saw the patient and reviewed the medical records.  A 40 year old female with a long history of depression and anxiety.  The patient took approximately 50 pills to kill herself.  She told me that she took pills to kill herself, so that her brother and sister can get the money from the insurance and they can take care of her kids.  She has 32 year old and 24 year old.  The patient has 2 jobs, but she still has financial stress.  The patient reported that she got counseling in the past which helped her but because of the financial problems, she was unable to go for a counseling regularly as her co-pay was high.  The patient also reported that she worries a lot throughout the day and reported having panic attacks in the fall.  The patient denies any obsessive compulsive symptoms.   The patient is very logical and goal directed to the interview. No flight of ideas present.  Denies hearing any voices, not internally preoccupied.  PAST MEDICAL HISTORY:  Hypothyroidism, diabetes, obesity, anemia, hypertension, endometrial hyperplasia, menorrhagia.  ALLERGIES:  The patient is allergic to LATEX GLOVES.  PAST PSYCH HISTORY:  The patient never been admitted to psych hospital in the past.  No history of suicide attempt in the past.  Substance abuse history, the patient denies any abuse of illicit drugs.  The patient also denied abusing alcohol.  MENTAL STATUS EXAM:  Calm, cooperative during interview, pleasant on approach.  No psychomotor agitation, retardation noted during the interview.  Hygiene, grooming fair.   Speech normal in rate and volume. Mood depressed, affect mood congruent.  Thought process logical and goal directed.  The patient was crying while describing her history.  Thought content, recent suicide attempt, not delusional.  Thought perception, no audiovisual hallucination reported, not internally preoccupied. Cognition alert, awake, oriented x3.  Memory immediate, recent remote intact.  Fund of knowledge fair, attention concentration fair. Abstraction ability good.  Insight and judgment poor.  DIAGNOSES:  AXIS I:  Major depressive disorder, recurrent type, generalized anxiety disorder, rule out panic disorder without agoraphobia, rule out obsessive-compulsive disorder. AXIS II:  Deferred. AXIS III:  See medical notes. AXIS IV:  Recent suicide attempt, psychosocial stressors. AXIS V:  40.  RECOMMENDATIONS: 1. The patient will get benefit from group therapy at [facility].  I told the patient that because of the seriousness of her     suicide attempt, she needs to get some good group therapy so that     she can deal with the stress in future.  She will also develop     coping skills to deal with stress by going to the group therapy.     Initially the patient  did not wanted to come to behavioral health     but then she later on agreed to come to [facility]. 2. The patient can be continued on Prozac 60 mg p.o. daily. 3. The patient can be continued on Ambien 5 mg at bedtime. 4. Further management for depression and anxiety, I will deffer to the admitting     doctor at [facility].  Thanks for involving me in taking care of this patient.     Marlou Sa, MD     SA/MEDQ  D:  06/12/2010  T:  06/12/2010  Job:  [id number]  Electronically Signed by Marlou Sa  on 06/13/2010 08:45:08 AM

## 2010-06-14 ENCOUNTER — Inpatient Hospital Stay (HOSPITAL_COMMUNITY): Payer: BC Managed Care – PPO

## 2010-06-14 LAB — COMPREHENSIVE METABOLIC PANEL
AST: 42 U/L — ABNORMAL HIGH (ref 0–37)
CO2: 25 mEq/L (ref 19–32)
Calcium: 8.6 mg/dL (ref 8.4–10.5)
Creatinine, Ser: 0.93 mg/dL (ref 0.4–1.2)
GFR calc Af Amer: >60 mL/min (ref >60)
GFR calc non Af Amer: >60 mL/min (ref >60)

## 2010-06-14 LAB — GLUCOSE, CAPILLARY

## 2010-06-14 LAB — DIFFERENTIAL
Eosinophils Relative: 1 % (ref 0–5)
Lymphocytes Relative: 24 % (ref 12–46)
Lymphs Abs: 1.8 10*3/uL (ref 0.7–4.0)
Neutrophils Relative %: 68 % (ref 43–77)

## 2010-06-14 LAB — LIPASE, BLOOD: Lipase: 21 U/L (ref 11–59)

## 2010-06-14 LAB — CBC
MCHC: 32.1 g/dL (ref 30.0–36.0)
MCV: 76.6 fL — ABNORMAL LOW (ref 78.0–100.0)
RBC: 3.76 MIL/uL — ABNORMAL LOW (ref 3.87–5.11)
RDW: 17 % — ABNORMAL HIGH (ref 11.5–15.5)

## 2010-06-14 LAB — POCT PREGNANCY, URINE: Preg Test, Ur: NEGATIVE

## 2010-06-15 ENCOUNTER — Inpatient Hospital Stay (HOSPITAL_COMMUNITY): Admission: AD | Admit: 2010-06-15 | Payer: BC Managed Care – PPO | Source: Ambulatory Visit | Admitting: Psychiatry

## 2010-06-16 LAB — GLUCOSE, CAPILLARY
Glucose-Capillary: 120 mg/dL — ABNORMAL HIGH (ref 70–99)
Glucose-Capillary: 157 mg/dL — ABNORMAL HIGH (ref 70–99)
Glucose-Capillary: 149 mg/dL — ABNORMAL HIGH (ref 70–99)
Glucose-Capillary: 107 mg/dL — ABNORMAL HIGH (ref 70–99)

## 2010-06-16 LAB — POCT PREGNANCY, URINE: Preg Test, Ur: NEGATIVE

## 2010-06-16 LAB — CBC
MCHC: 31.9 g/dL (ref 30.0–36.0)
RBC: 4.1 MIL/uL (ref 3.87–5.11)
WBC: 8.1 10*3/uL (ref 4.0–10.5)

## 2010-06-16 LAB — BASIC METABOLIC PANEL
Calcium: 9.3 mg/dL (ref 8.4–10.5)
Creatinine, Ser: 0.69 mg/dL (ref 0.4–1.2)
GFR calc Af Amer: >60 mL/min (ref >60)

## 2010-06-17 ENCOUNTER — Inpatient Hospital Stay (HOSPITAL_COMMUNITY): Payer: BC Managed Care – PPO

## 2010-06-17 DIAGNOSIS — M79609 Pain in unspecified limb: Secondary | ICD-10-CM

## 2010-06-17 LAB — GLUCOSE, CAPILLARY
Glucose-Capillary: 136 mg/dL — ABNORMAL HIGH (ref 70–99)
Glucose-Capillary: 118 mg/dL — ABNORMAL HIGH (ref 70–99)
Glucose-Capillary: 141 mg/dL — ABNORMAL HIGH (ref 70–99)
Glucose-Capillary: 205 mg/dL — ABNORMAL HIGH (ref 70–99)

## 2010-06-17 LAB — CROSSMATCH: Antibody Screen: NEGATIVE

## 2010-06-17 LAB — POCT I-STAT, CHEM 8
Calcium, Ion: 1.18 mmol/L (ref 1.12–1.32)
Glucose, Bld: 204 mg/dL — ABNORMAL HIGH (ref 70–99)
HCT: 26 % — ABNORMAL LOW (ref 36.0–46.0)
Hemoglobin: 8.8 g/dL — ABNORMAL LOW (ref 12.0–15.0)

## 2010-06-17 LAB — COMPREHENSIVE METABOLIC PANEL
ALT: 13 U/L (ref 0–35)
ALT: 16 U/L (ref 0–35)
AST: 23 U/L (ref 0–37)
Albumin: 3.1 g/dL — ABNORMAL LOW (ref 3.5–5.2)
Albumin: 3.4 g/dL — ABNORMAL LOW (ref 3.5–5.2)
Alkaline Phosphatase: 36 U/L — ABNORMAL LOW (ref 39–117)
Alkaline Phosphatase: 40 U/L (ref 39–117)
BUN: 5 mg/dL — ABNORMAL LOW (ref 6–23)
Chloride: 107 mEq/L (ref 96–112)
GFR calc Af Amer: >60 mL/min (ref >60)
Glucose, Bld: 207 mg/dL — ABNORMAL HIGH (ref 70–99)
Potassium: 3.5 mEq/L (ref 3.5–5.1)
Potassium: 3.8 mEq/L (ref 3.5–5.1)
Sodium: 136 mEq/L (ref 135–145)
Sodium: 139 mEq/L (ref 135–145)
Total Bilirubin: 0.6 mg/dL (ref 0.3–1.2)
Total Protein: 7.4 g/dL (ref 6.0–8.3)

## 2010-06-17 LAB — DRUGS OF ABUSE SCREEN W/O ALC, ROUTINE URINE
Barbiturate Quant, Ur: NEGATIVE
Cocaine Metabolites: NEGATIVE
Creatinine,U: 151.1 mg/dL
Opiate Screen, Urine: POSITIVE — AB

## 2010-06-17 LAB — DIFFERENTIAL
Basophils Relative: 0 % (ref 0–1)
Eosinophils Absolute: 0.1 10*3/uL (ref 0.0–0.7)
Monocytes Absolute: 0.4 10*3/uL (ref 0.1–1.0)
Monocytes Relative: 5 % (ref 3–12)
Neutrophils Relative %: 70 % (ref 43–77)

## 2010-06-17 LAB — OPIATE, QUANTITATIVE, URINE: Hydrocodone: 370 ng/mL

## 2010-06-17 LAB — CARDIAC PANEL(CRET KIN+CKTOT+MB+TROPI)
CK, MB: 2.2 ng/mL (ref 0.3–4.0)
CK, MB: 2.5 ng/mL (ref 0.3–4.0)
Relative Index: 1 (ref 0.0–2.5)
Total CK: 242 U/L — ABNORMAL HIGH (ref 7–177)
Troponin I: 0.04 ng/mL (ref 0.00–0.06)

## 2010-06-17 LAB — FOLATE: Folate: >20 ng/mL

## 2010-06-17 LAB — HEMOGLOBIN A1C: Mean Plasma Glucose: 183 mg/dL

## 2010-06-17 LAB — CBC
HCT: 30 % — ABNORMAL LOW (ref 36.0–46.0)
Hemoglobin: 7.9 g/dL — CL (ref 12.0–15.0)
Hemoglobin: 9.6 g/dL — ABNORMAL LOW (ref 12.0–15.0)
MCV: 83.1 fL (ref 78.0–100.0)
RBC: 3.61 MIL/uL — ABNORMAL LOW (ref 3.87–5.11)
RDW: 15.6 % — ABNORMAL HIGH (ref 11.5–15.5)
WBC: 10 10*3/uL (ref 4.0–10.5)

## 2010-06-17 LAB — RENAL FUNCTION PANEL
Albumin: 2.6 g/dL — ABNORMAL LOW (ref 3.5–5.2)
BUN: 38 mg/dL — ABNORMAL HIGH (ref 6–23)
CO2: 29 mEq/L (ref 19–32)
Chloride: 99 mEq/L (ref 96–112)
Glucose, Bld: 125 mg/dL — ABNORMAL HIGH (ref 70–99)
Potassium: 5.4 mEq/L — ABNORMAL HIGH (ref 3.5–5.1)

## 2010-06-17 LAB — RETICULOCYTES: Retic Ct Pct: 2 % (ref 0.4–3.1)

## 2010-06-17 LAB — CK: Total CK: 2476 U/L — ABNORMAL HIGH (ref 7–177)

## 2010-06-17 LAB — FERRITIN: Ferritin: 7 ng/mL — ABNORMAL LOW (ref 10–291)

## 2010-06-17 NOTE — Op Note (Signed)
  [name], [name]               ACCOUNT NO.:  [id number]  MEDICAL RECORD NO.:  [id number]          PATIENT TYPE:  INP  LOCATION:                               FACILITY:  [facility]  PHYSICIAN:  Nelda Severe. Kellie Simmering, M.D.  DATE OF BIRTH:  01-14-1971  DATE OF PROCEDURE:  06/08/2010 DATE OF DISCHARGE:                              OPERATIVE REPORT   PREOPERATIVE DIAGNOSIS:  Acute renal failure.  POSTOPERATIVE DIAGNOSIS:  Acute renal failure.  OPERATIONS: 1. Bilateral ultrasound localization of internal jugular veins. 2. Insertion Diatek catheter via right internal jugular vein (23 cm).  SURGEON:  Nelda Severe. Kellie Simmering, MD  FIRST ASSISTANT:  Nurse.  ANESTHESIA:  MAC.  PROCEDURE:  The patient was taken to the operating room and placed in the supine position at which time upper chest and neck were exposed. Both internal jugular veins were imaged using B-mode ultrasound and both were noted to be widely patent.  After infiltration with 1% Xylocaine, right internal jugular vein was accessed using a supraclavicular approach.  Guidewire was passed into the right atrium under fluoroscopic guidance.  After dilating the tract appropriately, a 23-cm Diatek catheter was positioned in the right atrium, tunneled peripherally, secured with nylon suture and the wound was closed with Vicryl in a subcuticular fashion.  Sterile dressing was applied.  The patient was taken to the recovery room in stable condition.     Nelda Severe Kellie Simmering, M.D.     JDL/MEDQ  D:  06/08/2010  T:  06/09/2010  Job:  [id number]  Electronically Signed by Tinnie Gens M.D. on 06/17/2010 10:21:44 AM

## 2010-06-18 ENCOUNTER — Encounter: Payer: Self-pay | Admitting: Family Medicine

## 2010-06-18 LAB — BASIC METABOLIC PANEL
Calcium: 8.3 mg/dL — ABNORMAL LOW (ref 8.4–10.5)
GFR calc Af Amer: 6 mL/min — ABNORMAL LOW (ref >60)
GFR calc non Af Amer: 5 mL/min — ABNORMAL LOW (ref >60)
Sodium: 140 mEq/L (ref 135–145)

## 2010-06-18 LAB — GLUCOSE, CAPILLARY
Glucose-Capillary: 116 mg/dL — ABNORMAL HIGH (ref 70–99)
Glucose-Capillary: 117 mg/dL — ABNORMAL HIGH (ref 70–99)

## 2010-06-19 ENCOUNTER — Inpatient Hospital Stay (HOSPITAL_COMMUNITY): Payer: BC Managed Care – PPO

## 2010-06-19 LAB — GLUCOSE, CAPILLARY
Glucose-Capillary: 132 mg/dL — ABNORMAL HIGH (ref 70–99)
Glucose-Capillary: 154 mg/dL — ABNORMAL HIGH (ref 70–99)

## 2010-06-19 LAB — RENAL FUNCTION PANEL
BUN: 36 mg/dL — ABNORMAL HIGH (ref 6–23)
CO2: 27 mEq/L (ref 19–32)
Calcium: 8.4 mg/dL (ref 8.4–10.5)
Glucose, Bld: 121 mg/dL — ABNORMAL HIGH (ref 70–99)
Phosphorus: 5.6 mg/dL — ABNORMAL HIGH (ref 2.3–4.6)
Potassium: 6 mEq/L — ABNORMAL HIGH (ref 3.5–5.1)

## 2010-06-20 ENCOUNTER — Encounter: Payer: Self-pay | Admitting: Family Medicine

## 2010-06-20 LAB — URINE MICROSCOPIC-ADD ON

## 2010-06-20 LAB — GLUCOSE, CAPILLARY
Glucose-Capillary: 119 mg/dL — ABNORMAL HIGH (ref 70–99)
Glucose-Capillary: 96 mg/dL (ref 70–99)
Glucose-Capillary: 136 mg/dL — ABNORMAL HIGH (ref 70–99)

## 2010-06-20 LAB — COMPREHENSIVE METABOLIC PANEL
ALT: 30 U/L (ref 0–35)
AST: 87 U/L — ABNORMAL HIGH (ref 0–37)
Albumin: 3 g/dL — ABNORMAL LOW (ref 3.5–5.2)
Alkaline Phosphatase: 115 U/L (ref 39–117)
BUN: 11 mg/dL (ref 6–23)
CO2: 27 mEq/L (ref 19–32)
Chloride: 105 mEq/L (ref 96–112)
Creatinine, Ser: 0.79 mg/dL (ref 0.4–1.2)
GFR calc Af Amer: >60 mL/min (ref >60)
Glucose, Bld: 175 mg/dL — ABNORMAL HIGH (ref 70–99)
Total Protein: 6.7 g/dL (ref 6.0–8.3)

## 2010-06-20 LAB — RENAL FUNCTION PANEL
Albumin: 2.7 g/dL — ABNORMAL LOW (ref 3.5–5.2)
CO2: 31 mEq/L (ref 19–32)
Calcium: 8.4 mg/dL (ref 8.4–10.5)
GFR calc Af Amer: 7 mL/min — ABNORMAL LOW (ref >60)
GFR calc non Af Amer: 6 mL/min — ABNORMAL LOW (ref >60)
Sodium: 140 mEq/L (ref 135–145)

## 2010-06-20 LAB — DIFFERENTIAL
Basophils Relative: 0 % (ref 0–1)
Eosinophils Absolute: 0.1 10*3/uL (ref 0.0–0.7)
Monocytes Absolute: 0.4 10*3/uL (ref 0.1–1.0)
Monocytes Relative: 5 % (ref 3–12)

## 2010-06-20 LAB — URINALYSIS, ROUTINE W REFLEX MICROSCOPIC
Glucose, UA: NEGATIVE mg/dL
Ketones, ur: NEGATIVE mg/dL
Protein, ur: NEGATIVE mg/dL

## 2010-06-20 LAB — CBC
HCT: 33.7 % — ABNORMAL LOW (ref 36.0–46.0)
Hemoglobin: 11.1 g/dL — ABNORMAL LOW (ref 12.0–15.0)
MCHC: 32.9 g/dL (ref 30.0–36.0)
MCV: 86 fL (ref 78.0–100.0)
RBC: 3.91 MIL/uL (ref 3.87–5.11)
WBC: 8.6 10*3/uL (ref 4.0–10.5)

## 2010-06-21 ENCOUNTER — Inpatient Hospital Stay (HOSPITAL_COMMUNITY): Payer: BC Managed Care – PPO

## 2010-06-21 LAB — CBC
MCV: 75.3 fL — ABNORMAL LOW (ref 78.0–100.0)
Platelets: 562 10*3/uL — ABNORMAL HIGH (ref 150–400)
RBC: 2.96 MIL/uL — ABNORMAL LOW (ref 3.87–5.11)
WBC: 10.3 10*3/uL (ref 4.0–10.5)

## 2010-06-21 LAB — GLUCOSE, CAPILLARY: Glucose-Capillary: 131 mg/dL — ABNORMAL HIGH (ref 70–99)

## 2010-06-21 LAB — RENAL FUNCTION PANEL
Albumin: 2.8 g/dL — ABNORMAL LOW (ref 3.5–5.2)
Chloride: 100 mEq/L (ref 96–112)
GFR calc Af Amer: 5 mL/min — ABNORMAL LOW (ref >60)
GFR calc non Af Amer: 4 mL/min — ABNORMAL LOW (ref >60)
Potassium: 4.8 mEq/L (ref 3.5–5.1)

## 2010-06-22 ENCOUNTER — Inpatient Hospital Stay (HOSPITAL_COMMUNITY): Payer: BC Managed Care – PPO

## 2010-06-22 LAB — BASIC METABOLIC PANEL
BUN: 21 mg/dL (ref 6–23)
CO2: 29 mEq/L (ref 19–32)
Calcium: 8.5 mg/dL (ref 8.4–10.5)
Chloride: 102 mEq/L (ref 96–112)
Creatinine, Ser: 7.82 mg/dL — ABNORMAL HIGH (ref 0.4–1.2)
GFR calc Af Amer: 7 mL/min — ABNORMAL LOW (ref >60)
GFR calc non Af Amer: 6 mL/min — ABNORMAL LOW (ref >60)
Glucose, Bld: 133 mg/dL — ABNORMAL HIGH (ref 70–99)
Potassium: 4.6 mEq/L (ref 3.5–5.1)
Sodium: 140 mEq/L (ref 135–145)

## 2010-06-22 LAB — CBC
HCT: 24 % — ABNORMAL LOW (ref 36.0–46.0)
Hemoglobin: 7.2 g/dL — ABNORMAL LOW (ref 12.0–15.0)
MCH: 22.9 pg — ABNORMAL LOW (ref 26.0–34.0)
MCHC: 30 g/dL (ref 30.0–36.0)
MCV: 76.4 fL — ABNORMAL LOW (ref 78.0–100.0)
Platelets: 540 10*3/uL — ABNORMAL HIGH (ref 150–400)
RBC: 3.14 MIL/uL — ABNORMAL LOW (ref 3.87–5.11)
RDW: 19.4 % — ABNORMAL HIGH (ref 11.5–15.5)
WBC: 9.8 10*3/uL (ref 4.0–10.5)

## 2010-06-22 LAB — CARDIAC PANEL(CRET KIN+CKTOT+MB+TROPI)
CK, MB: 3 ng/mL (ref 0.3–4.0)
CK, MB: 2.9 ng/mL (ref 0.3–4.0)
Relative Index: 0.7 (ref 0.0–2.5)
Relative Index: 0.8 (ref 0.0–2.5)
Total CK: 405 U/L — ABNORMAL HIGH (ref 7–177)
Total CK: 383 U/L — ABNORMAL HIGH (ref 7–177)
Troponin I: 0.05 ng/mL (ref 0.00–0.06)
Troponin I: 0.04 ng/mL (ref 0.00–0.06)

## 2010-06-22 LAB — TYPE AND SCREEN: Unit division: 0

## 2010-06-22 LAB — GLUCOSE, CAPILLARY
Glucose-Capillary: 127 mg/dL — ABNORMAL HIGH (ref 70–99)
Glucose-Capillary: 132 mg/dL — ABNORMAL HIGH (ref 70–99)

## 2010-06-22 LAB — PROTIME-INR
INR: 1 (ref 0.00–1.49)
Prothrombin Time: 13.4 seconds (ref 11.6–15.2)

## 2010-06-22 LAB — D-DIMER, QUANTITATIVE: D-Dimer, Quant: 3.78 ug/mL-FEU — ABNORMAL HIGH (ref 0.00–0.48)

## 2010-06-22 MED ORDER — TECHNETIUM TO 99M ALBUMIN AGGREGATED
6.0000 | Freq: Once | INTRAVENOUS | Status: AC | PRN
  Administered 2010-06-22: 6 via INTRAVENOUS

## 2010-06-22 MED ORDER — XENON XE 133 GAS
10.0000 | GAS_FOR_INHALATION | Freq: Once | RESPIRATORY_TRACT | Status: AC | PRN
  Administered 2010-06-22: 10 via RESPIRATORY_TRACT

## 2010-06-23 LAB — GLUCOSE, CAPILLARY: Glucose-Capillary: 130 mg/dL — ABNORMAL HIGH (ref 70–99)

## 2010-06-23 LAB — RENAL FUNCTION PANEL
BUN: 24 mg/dL — ABNORMAL HIGH (ref 6–23)
CO2: 29 mEq/L (ref 19–32)
Calcium: 8.3 mg/dL — ABNORMAL LOW (ref 8.4–10.5)
Creatinine, Ser: 8.98 mg/dL — ABNORMAL HIGH (ref 0.4–1.2)
GFR calc Af Amer: 6 mL/min — ABNORMAL LOW (ref >60)
Glucose, Bld: 116 mg/dL — ABNORMAL HIGH (ref 70–99)

## 2010-06-24 DIAGNOSIS — M7989 Other specified soft tissue disorders: Secondary | ICD-10-CM

## 2010-06-24 LAB — CBC
MCH: 22.6 pg — ABNORMAL LOW (ref 26.0–34.0)
MCHC: 29.7 g/dL — ABNORMAL LOW (ref 30.0–36.0)
MCV: 76.1 fL — ABNORMAL LOW (ref 78.0–100.0)
Platelets: 591 10*3/uL — ABNORMAL HIGH (ref 150–400)
RBC: 3.05 MIL/uL — ABNORMAL LOW (ref 3.87–5.11)
RDW: 20.3 % — ABNORMAL HIGH (ref 11.5–15.5)

## 2010-06-24 LAB — RENAL FUNCTION PANEL
Albumin: 2.9 g/dL — ABNORMAL LOW (ref 3.5–5.2)
BUN: 38 mg/dL — ABNORMAL HIGH (ref 6–23)
Calcium: 8.5 mg/dL (ref 8.4–10.5)
Chloride: 101 mEq/L (ref 96–112)
Creatinine, Ser: 10.11 mg/dL — ABNORMAL HIGH (ref 0.4–1.2)
GFR calc Af Amer: 5 mL/min — ABNORMAL LOW (ref >60)
GFR calc non Af Amer: 4 mL/min — ABNORMAL LOW (ref >60)
Phosphorus: 5.4 mg/dL — ABNORMAL HIGH (ref 2.3–4.6)

## 2010-06-24 LAB — GLUCOSE, CAPILLARY: Glucose-Capillary: 95 mg/dL (ref 70–99)

## 2010-06-24 LAB — BASIC METABOLIC PANEL
BUN: 39 mg/dL — ABNORMAL HIGH (ref 6–23)
Creatinine, Ser: 10.12 mg/dL — ABNORMAL HIGH (ref 0.4–1.2)
GFR calc non Af Amer: 4 mL/min — ABNORMAL LOW (ref >60)
Glucose, Bld: 102 mg/dL — ABNORMAL HIGH (ref 70–99)
Potassium: 4.8 mEq/L (ref 3.5–5.1)

## 2010-06-25 DIAGNOSIS — N179 Acute kidney failure, unspecified: Secondary | ICD-10-CM

## 2010-06-25 DIAGNOSIS — E872 Acidosis: Secondary | ICD-10-CM

## 2010-06-25 DIAGNOSIS — E118 Type 2 diabetes mellitus with unspecified complications: Secondary | ICD-10-CM

## 2010-06-25 DIAGNOSIS — M6282 Rhabdomyolysis: Secondary | ICD-10-CM

## 2010-06-25 LAB — RENAL FUNCTION PANEL
Albumin: 2.9 g/dL — ABNORMAL LOW (ref 3.5–5.2)
CO2: 28 mEq/L (ref 19–32)
Calcium: 8.5 mg/dL (ref 8.4–10.5)
Chloride: 103 mEq/L (ref 96–112)
Creatinine, Ser: 11.11 mg/dL — ABNORMAL HIGH (ref 0.4–1.2)
GFR calc Af Amer: 5 mL/min — ABNORMAL LOW (ref >60)
GFR calc non Af Amer: 4 mL/min — ABNORMAL LOW (ref >60)
Sodium: 141 mEq/L (ref 135–145)

## 2010-06-25 LAB — CBC
Hemoglobin: 7 g/dL — ABNORMAL LOW (ref 12.0–15.0)
MCHC: 30.2 g/dL (ref 30.0–36.0)
RDW: 20.4 % — ABNORMAL HIGH (ref 11.5–15.5)
WBC: 8 10*3/uL (ref 4.0–10.5)

## 2010-06-25 LAB — GLUCOSE, CAPILLARY
Glucose-Capillary: 127 mg/dL — ABNORMAL HIGH (ref 70–99)
Glucose-Capillary: 108 mg/dL — ABNORMAL HIGH (ref 70–99)

## 2010-06-26 LAB — GLUCOSE, CAPILLARY
Glucose-Capillary: 97 mg/dL (ref 70–99)
Glucose-Capillary: 183 mg/dL — ABNORMAL HIGH (ref 70–99)

## 2010-06-26 LAB — RENAL FUNCTION PANEL
BUN: 54 mg/dL — ABNORMAL HIGH (ref 6–23)
CO2: 27 mEq/L (ref 19–32)
Chloride: 101 mEq/L (ref 96–112)
Creatinine, Ser: 11.45 mg/dL — ABNORMAL HIGH (ref 0.4–1.2)
Glucose, Bld: 124 mg/dL — ABNORMAL HIGH (ref 70–99)

## 2010-06-26 LAB — CBC
HCT: 25 % — ABNORMAL LOW (ref 36.0–46.0)
Hemoglobin: 7.5 g/dL — ABNORMAL LOW (ref 12.0–15.0)
MCH: 23 pg — ABNORMAL LOW (ref 26.0–34.0)
MCHC: 30 g/dL (ref 30.0–36.0)
RDW: 20.8 % — ABNORMAL HIGH (ref 11.5–15.5)

## 2010-06-27 LAB — CBC
MCV: 75.9 fL — ABNORMAL LOW (ref 78.0–100.0)
Platelets: 651 10*3/uL — ABNORMAL HIGH (ref 150–400)
RDW: 20.4 % — ABNORMAL HIGH (ref 11.5–15.5)
WBC: 8.6 10*3/uL (ref 4.0–10.5)

## 2010-06-27 LAB — RENAL FUNCTION PANEL
Calcium: 8.9 mg/dL (ref 8.4–10.5)
GFR calc Af Amer: 4 mL/min — ABNORMAL LOW (ref >60)
Glucose, Bld: 111 mg/dL — ABNORMAL HIGH (ref 70–99)
Phosphorus: 6.1 mg/dL — ABNORMAL HIGH (ref 2.3–4.6)
Sodium: 142 mEq/L (ref 135–145)

## 2010-06-27 LAB — GLUCOSE, CAPILLARY
Glucose-Capillary: 107 mg/dL — ABNORMAL HIGH (ref 70–99)
Glucose-Capillary: 171 mg/dL — ABNORMAL HIGH (ref 70–99)
Glucose-Capillary: 111 mg/dL — ABNORMAL HIGH (ref 70–99)
Glucose-Capillary: 156 mg/dL — ABNORMAL HIGH (ref 70–99)

## 2010-06-28 LAB — CBC
HCT: 26 % — ABNORMAL LOW (ref 36.0–46.0)
MCH: 22.9 pg — ABNORMAL LOW (ref 26.0–34.0)
MCHC: 30 g/dL (ref 30.0–36.0)
MCV: 76.2 fL — ABNORMAL LOW (ref 78.0–100.0)
Platelets: 640 10*3/uL — ABNORMAL HIGH (ref 150–400)
RDW: 20.4 % — ABNORMAL HIGH (ref 11.5–15.5)
WBC: 7.9 10*3/uL (ref 4.0–10.5)

## 2010-06-28 LAB — RENAL FUNCTION PANEL
Albumin: 3.5 g/dL (ref 3.5–5.2)
BUN: 62 mg/dL — ABNORMAL HIGH (ref 6–23)
Calcium: 8.9 mg/dL (ref 8.4–10.5)
Creatinine, Ser: 10.87 mg/dL — ABNORMAL HIGH (ref 0.4–1.2)
Glucose, Bld: 140 mg/dL — ABNORMAL HIGH (ref 70–99)
Phosphorus: 5.8 mg/dL — ABNORMAL HIGH (ref 2.3–4.6)
Potassium: 5.1 mEq/L (ref 3.5–5.1)

## 2010-06-29 ENCOUNTER — Inpatient Hospital Stay (HOSPITAL_COMMUNITY): Payer: BC Managed Care – PPO

## 2010-06-29 LAB — CBC
HCT: 26.8 % — ABNORMAL LOW (ref 36.0–46.0)
Hemoglobin: 7.8 g/dL — ABNORMAL LOW (ref 12.0–15.0)
MCH: 22.3 pg — ABNORMAL LOW (ref 26.0–34.0)
MCHC: 29.1 g/dL — ABNORMAL LOW (ref 30.0–36.0)
MCV: 76.8 fL — ABNORMAL LOW (ref 78.0–100.0)
RDW: 20.5 % — ABNORMAL HIGH (ref 11.5–15.5)

## 2010-06-29 LAB — RENAL FUNCTION PANEL
BUN: 65 mg/dL — ABNORMAL HIGH (ref 6–23)
CO2: 26 mEq/L (ref 19–32)
Calcium: 8.8 mg/dL (ref 8.4–10.5)
Glucose, Bld: 89 mg/dL (ref 70–99)
Phosphorus: 6.3 mg/dL — ABNORMAL HIGH (ref 2.3–4.6)
Sodium: 143 mEq/L (ref 135–145)

## 2010-06-29 LAB — GLUCOSE, CAPILLARY: Glucose-Capillary: 95 mg/dL (ref 70–99)

## 2010-06-30 LAB — GLUCOSE, CAPILLARY
Glucose-Capillary: 93 mg/dL (ref 70–99)
Glucose-Capillary: 120 mg/dL — ABNORMAL HIGH (ref 70–99)
Glucose-Capillary: 173 mg/dL — ABNORMAL HIGH (ref 70–99)
Glucose-Capillary: 115 mg/dL — ABNORMAL HIGH (ref 70–99)

## 2010-06-30 LAB — BASIC METABOLIC PANEL
CO2: 23 mEq/L (ref 19–32)
Calcium: 9.2 mg/dL (ref 8.4–10.5)
Chloride: 105 mEq/L (ref 96–112)
Creatinine, Ser: 6.42 mg/dL — ABNORMAL HIGH (ref 0.4–1.2)
GFR calc Af Amer: 9 mL/min — ABNORMAL LOW (ref >60)
Sodium: 138 mEq/L (ref 135–145)

## 2010-07-01 LAB — COMPREHENSIVE METABOLIC PANEL
BUN: 43 mg/dL — ABNORMAL HIGH (ref 6–23)
CO2: 24 mEq/L (ref 19–32)
Calcium: 8.9 mg/dL (ref 8.4–10.5)
Creatinine, Ser: 6.57 mg/dL — ABNORMAL HIGH (ref 0.4–1.2)
GFR calc non Af Amer: 7 mL/min — ABNORMAL LOW (ref >60)
Glucose, Bld: 99 mg/dL (ref 70–99)
Total Protein: 8.1 g/dL (ref 6.0–8.3)

## 2010-07-01 LAB — CBC
HCT: 27.6 % — ABNORMAL LOW (ref 36.0–46.0)
Hemoglobin: 8.3 g/dL — ABNORMAL LOW (ref 12.0–15.0)
MCH: 23.1 pg — ABNORMAL LOW (ref 26.0–34.0)
MCHC: 30.1 g/dL (ref 30.0–36.0)
RDW: 19.9 % — ABNORMAL HIGH (ref 11.5–15.5)

## 2010-07-01 LAB — GLUCOSE, CAPILLARY
Glucose-Capillary: 127 mg/dL — ABNORMAL HIGH (ref 70–99)
Glucose-Capillary: 120 mg/dL — ABNORMAL HIGH (ref 70–99)
Glucose-Capillary: 170 mg/dL — ABNORMAL HIGH (ref 70–99)

## 2010-07-01 LAB — LIPASE, BLOOD: Lipase: 29 U/L (ref 11–59)

## 2010-07-02 LAB — URINE MICROSCOPIC-ADD ON

## 2010-07-02 LAB — CBC
HCT: 30.4 % — ABNORMAL LOW (ref 36.0–46.0)
MCHC: 29.6 g/dL — ABNORMAL LOW (ref 30.0–36.0)
Platelets: 505 10*3/uL — ABNORMAL HIGH (ref 150–400)
RDW: 19.9 % — ABNORMAL HIGH (ref 11.5–15.5)

## 2010-07-02 LAB — BASIC METABOLIC PANEL
Calcium: 9.7 mg/dL (ref 8.4–10.5)
GFR calc Af Amer: 9 mL/min — ABNORMAL LOW (ref >60)
GFR calc non Af Amer: 7 mL/min — ABNORMAL LOW (ref >60)
Glucose, Bld: 118 mg/dL — ABNORMAL HIGH (ref 70–99)
Sodium: 142 mEq/L (ref 135–145)

## 2010-07-02 LAB — URINALYSIS, ROUTINE W REFLEX MICROSCOPIC
Glucose, UA: NEGATIVE mg/dL
Leukocytes, UA: NEGATIVE
Protein, ur: 30 mg/dL — AB
pH: 6.5 (ref 5.0–8.0)

## 2010-07-02 LAB — GLUCOSE, CAPILLARY
Glucose-Capillary: 123 mg/dL — ABNORMAL HIGH (ref 70–99)
Glucose-Capillary: 179 mg/dL — ABNORMAL HIGH (ref 70–99)

## 2010-07-03 LAB — RENAL FUNCTION PANEL
BUN: 44 mg/dL — ABNORMAL HIGH (ref 6–23)
CO2: 22 mEq/L (ref 19–32)
Calcium: 9.5 mg/dL (ref 8.4–10.5)
Creatinine, Ser: 5.71 mg/dL — ABNORMAL HIGH (ref 0.4–1.2)
Glucose, Bld: 137 mg/dL — ABNORMAL HIGH (ref 70–99)
Phosphorus: 4.5 mg/dL (ref 2.3–4.6)

## 2010-07-03 LAB — URINE CULTURE
Colony Count: NO GROWTH
Culture  Setup Time: 201204242100

## 2010-07-03 LAB — GLUCOSE, CAPILLARY: Glucose-Capillary: 141 mg/dL — ABNORMAL HIGH (ref 70–99)

## 2010-07-04 ENCOUNTER — Inpatient Hospital Stay (HOSPITAL_COMMUNITY): Payer: BC Managed Care – PPO

## 2010-07-04 LAB — BASIC METABOLIC PANEL
BUN: 42 mg/dL — ABNORMAL HIGH (ref 6–23)
CO2: 21 mEq/L (ref 19–32)
Calcium: 10.1 mg/dL (ref 8.4–10.5)
GFR calc non Af Amer: 9 mL/min — ABNORMAL LOW (ref >60)
Glucose, Bld: 134 mg/dL — ABNORMAL HIGH (ref 70–99)

## 2010-07-04 LAB — GLUCOSE, CAPILLARY
Glucose-Capillary: 152 mg/dL — ABNORMAL HIGH (ref 70–99)
Glucose-Capillary: 123 mg/dL — ABNORMAL HIGH (ref 70–99)

## 2010-07-05 LAB — RENAL FUNCTION PANEL
CO2: 22 mEq/L (ref 19–32)
Calcium: 9.5 mg/dL (ref 8.4–10.5)
GFR calc Af Amer: 12 mL/min — ABNORMAL LOW (ref >60)
GFR calc non Af Amer: 10 mL/min — ABNORMAL LOW (ref >60)
Glucose, Bld: 127 mg/dL — ABNORMAL HIGH (ref 70–99)
Phosphorus: 4.6 mg/dL (ref 2.3–4.6)
Potassium: 4.5 mEq/L (ref 3.5–5.1)
Sodium: 136 mEq/L (ref 135–145)

## 2010-07-05 LAB — GLUCOSE, CAPILLARY
Glucose-Capillary: 119 mg/dL — ABNORMAL HIGH (ref 70–99)
Glucose-Capillary: 161 mg/dL — ABNORMAL HIGH (ref 70–99)

## 2010-07-06 ENCOUNTER — Inpatient Hospital Stay (HOSPITAL_COMMUNITY)
Admission: AD | Admit: 2010-07-06 | Discharge: 2010-07-08 | DRG: 430 | Disposition: A | Discharge status: Home / Self Care | Payer: BC Managed Care – PPO | Source: Ambulatory Visit | Attending: Psychiatry | Admitting: Psychiatry

## 2010-07-06 DIAGNOSIS — F4325 Adjustment disorder with mixed disturbance of emotions and conduct: Secondary | ICD-10-CM

## 2010-07-06 DIAGNOSIS — D509 Iron deficiency anemia, unspecified: Secondary | ICD-10-CM

## 2010-07-06 DIAGNOSIS — T43502A Poisoning by unspecified antipsychotics and neuroleptics, intentional self-harm, initial encounter: Secondary | ICD-10-CM

## 2010-07-06 DIAGNOSIS — N179 Acute kidney failure, unspecified: Secondary | ICD-10-CM

## 2010-07-06 DIAGNOSIS — F331 Major depressive disorder, recurrent, moderate: Principal | ICD-10-CM

## 2010-07-06 DIAGNOSIS — I10 Essential (primary) hypertension: Secondary | ICD-10-CM

## 2010-07-06 DIAGNOSIS — E039 Hypothyroidism, unspecified: Secondary | ICD-10-CM

## 2010-07-06 DIAGNOSIS — M6282 Rhabdomyolysis: Secondary | ICD-10-CM

## 2010-07-06 DIAGNOSIS — E119 Type 2 diabetes mellitus without complications: Secondary | ICD-10-CM

## 2010-07-06 DIAGNOSIS — T43011A Poisoning by tricyclic antidepressants, accidental (unintentional), initial encounter: Secondary | ICD-10-CM

## 2010-07-06 LAB — GLUCOSE, CAPILLARY
Glucose-Capillary: 116 mg/dL — ABNORMAL HIGH (ref 70–99)
Glucose-Capillary: 113 mg/dL — ABNORMAL HIGH (ref 70–99)
Glucose-Capillary: 129 mg/dL — ABNORMAL HIGH (ref 70–99)

## 2010-07-07 LAB — CBC
HCT: 27.8 % — ABNORMAL LOW (ref 36.0–46.0)
Hemoglobin: 8.4 g/dL — ABNORMAL LOW (ref 12.0–15.0)
MCH: 23.1 pg — ABNORMAL LOW (ref 26.0–34.0)
MCHC: 30.2 g/dL (ref 30.0–36.0)

## 2010-07-07 LAB — GLUCOSE, CAPILLARY
Glucose-Capillary: 115 mg/dL — ABNORMAL HIGH (ref 70–99)
Glucose-Capillary: 140 mg/dL — ABNORMAL HIGH (ref 70–99)

## 2010-07-07 LAB — DIFFERENTIAL
Basophils Relative: 0 % (ref 0–1)
Monocytes Absolute: 0.7 10*3/uL (ref 0.1–1.0)
Monocytes Relative: 9 % (ref 3–12)
Neutro Abs: 3.7 10*3/uL (ref 1.7–7.7)

## 2010-07-08 LAB — COMPREHENSIVE METABOLIC PANEL
BUN: 46 mg/dL — ABNORMAL HIGH (ref 6–23)
CO2: 23 mEq/L (ref 19–32)
Calcium: 9.2 mg/dL (ref 8.4–10.5)
Creatinine, Ser: 3.76 mg/dL — ABNORMAL HIGH (ref 0.4–1.2)
GFR calc Af Amer: 16 mL/min — ABNORMAL LOW (ref >60)
GFR calc non Af Amer: 13 mL/min — ABNORMAL LOW (ref >60)
Glucose, Bld: 169 mg/dL — ABNORMAL HIGH (ref 70–99)

## 2010-07-08 NOTE — H&P (Signed)
[name], [name] NO.:  [id number]  MEDICAL RECORD NO.:  [id number]           PATIENT TYPE:  LOCATION:                                 FACILITY:  PHYSICIAN:  Dalbert Mayotte, MD        DATE OF BIRTH:  [birth year]  DATE OF ADMISSION: DATE OF DISCHARGE:                             HISTORY & PHYSICAL   PRIMARY CARE PROVIDER:  Tereasa Coop, FNP  CHIEF COMPLAINT:  Overdose.  HISTORY OF PRESENT ILLNESS:  [name] is a 40 year old female with past medical history of depression and anxiety, presenting with overdose.  States decided that life stressors, the bills, the [place] breaking down and other stressors were just too much and she would rather kill herself.  She states, "I have been so depressed and so sad, I decided that I just cannot do it anymore."  Had active plan to overdose on medications, took doxepin 50-20 tablets and metformin 4 tablets, wanted to go to sleep and not wake up.  Brother found her on the floor.  The patient remembers brother talking to her.  He called EMS.  The patient states that she felt sleepy but has had no other side effects of taking the above medications.  No homicidal ideation. Continue to have suicidal ideation.  When asked, she states she is unsure if she would or would not make another attempt if allowed to go home.  She has never had a suicide attempt in the past.  The patient states that she has pain and numbness all the way up right leg. Numbness is present per the patient in her entire right leg.  No fever. No shortness of breath.  No chest pain.  No edema.  No auditory or visual hallucinations.  PAST MEDICAL PROBLEMS: 1. Hypothyroidism. 2. Diabetes. 3. Obesity. 4. Anemia. 5. Depression and anxiety. 6. Obsessive-compulsive disorder. 7. Hypertension. 8. Allergic rhinitis. 9. Gallstones. 10.Endometrial hyperplasia. 11.Menorrhagia. 12.Nausea, vomiting, abdominal pain.  PAST SURGICAL HISTORY:  No surgeries.  SOCIAL  HISTORY:  The patient lives with son who is 81 and her brother who lives with her off and on.  The patient does not smoke.  Occasional alcohol use.  Denies drug use.  FAMILY HISTORY:  Mother had lupus, high blood pressure, as well as eye problems.  Father had high blood pressure.  Siblings, sister had MS, brother healthy.  ALLERGIES:  LATEX GLOVES.  MEDICINES: 1. Lorazepam 1 mg as needed. 2. Prozac 20 mg 3 tablets daily. 3. MiraLax p.r.n. 4. Albuterol p.r.n. 5. Doxepin 25 mg capsules daily. 6. Enalapril 2 mg daily. 7. Ferrous sulfate 325 mg daily. 8. Levothyroxine 200 mcg daily. 9. Metformin 1000 mg p.o. daily. 10.Zofran 8 mg daily. 11.Oxycodone 5/325 mg tablets.  REVIEW OF SYSTEMS:  As per HPI.  PHYSICAL EXAMINATION:  VITAL SIGNS:  Pulse 115, blood pressure 135/83, respiratory rate 18, pulse ox 99% on room air. GENERAL:  Cooperative and appears stated age.  Positive drowsiness. HEENT:  Pupils equal, round, reactive to light and accommodation.  Mild thyromegaly. HEART:  Regular rate and rhythm.  Positive systolic murmur. LUNGS:  Clear to auscultation.  No wheezes, rales, or labored breathing. ABDOMEN:  Soft without significant tenderness.  No masses, organomegaly, or guarding. EXTREMITIES:  Normal.  No cyanosis or edema. SKIN:  No rashes. NEUROLOGIC:  Cranial nerves II through XII grossly intact, following all commands, drowsy but awake and interactive.  Strength is 5/5 in all four extremities, equal bilateral.  LABORATORY DATA:  Sodium 138, potassium 6.0 in the [facility] Emergency Department, chloride 105, bicarb 22, BUN 17, creatinine 2.14, glucose 238.  White blood cells 15.2, hemoglobin 10, hematocrit 34.9, platelets 559,000.  ASSESSMENT AND PLAN:  [name] is a 40 year old female presenting with overdose. 1. Overdose.  We will continue Mucomyst protocol per poison control     recommendations.  We will keep the patient on monitor.  We will     monitor  drowsiness level and neuro exam.  We will hold off any     medications.  We will need to obtain psych consult. 2. Lactic acidosis, elevation.  We will recheck lactic acid level to     make sure that it was responding to Mucomyst. 3. Hyperkalemia.  Not treated in the emergency department.  We will     recheck with stat BMET to ensure that this is a true elevation.  We     will treat with Kayexalate if true elevation on stat BMET. 4. Diabetes.  We will monitor CBGs closely.  We will cover with     sliding scale insulin as needed. 5. Hypothyroidism.  We will continue Levoxyl per home regimen. 6. Tachycardia.  We will monitor on telemetry.  We will give IV fluids     to ensure that dehydration is not contributing. 7. Right leg pain located to right leg only.  No red flags.  No     neurological involvement detected on exam.  We will monitor neuro     exam. 8. Acute renal failure.  Baseline creatinine 0.8, creatinine currently     greater than 2.  We will monitor closely and ensure the patient is     receiving needed p.o. liquid hydration.  We will also give fluids     at 125 mL/hour normal saline. 9. Fluids, electrolytes, nutrition/gastrointestinal.  Regular. 10.Prophylaxis.  Heparin subcu t.i.d. 11.Systolic murmur.  The patient has no history of systolic murmur,     only noted on exam.  We will continue to follow clinical exam and     order echo if indicated. 12.Disposition pending clinical improvement, psych consult.     Dorthey Sawyer, MD   ______________________________ Dalbert Mayotte, MD    DC/MEDQ  D:  06/06/2010  T:  06/06/2010  Job:  [id number]  Electronically Signed by Dorthey Sawyer  on 06/25/2010 02:31:53 PM Electronically Signed by Dalbert Mayotte MD on 07/08/2010 04:53:51 PM

## 2010-07-09 ENCOUNTER — Ambulatory Visit (INDEPENDENT_AMBULATORY_CARE_PROVIDER_SITE_OTHER): Payer: BC Managed Care – PPO | Admitting: Family Medicine

## 2010-07-09 ENCOUNTER — Emergency Department (HOSPITAL_COMMUNITY)
Admission: EM | Admit: 2010-07-09 | Discharge: 2010-07-09 | Disposition: A | Discharge status: Home / Self Care | Payer: BC Managed Care – PPO | Attending: Emergency Medicine | Admitting: Emergency Medicine

## 2010-07-09 VITALS — BP 158/91 | HR 105 | Temp 98.9°F | Wt 349.0 lb

## 2010-07-09 DIAGNOSIS — I10 Essential (primary) hypertension: Secondary | ICD-10-CM | POA: Insufficient documentation

## 2010-07-09 DIAGNOSIS — F329 Major depressive disorder, single episode, unspecified: Secondary | ICD-10-CM | POA: Insufficient documentation

## 2010-07-09 DIAGNOSIS — M79609 Pain in unspecified limb: Secondary | ICD-10-CM | POA: Insufficient documentation

## 2010-07-09 DIAGNOSIS — E860 Dehydration: Secondary | ICD-10-CM | POA: Insufficient documentation

## 2010-07-09 DIAGNOSIS — F339 Major depressive disorder, recurrent, unspecified: Secondary | ICD-10-CM

## 2010-07-09 DIAGNOSIS — F3289 Other specified depressive episodes: Secondary | ICD-10-CM | POA: Insufficient documentation

## 2010-07-09 DIAGNOSIS — N179 Acute kidney failure, unspecified: Secondary | ICD-10-CM

## 2010-07-09 DIAGNOSIS — R5381 Other malaise: Secondary | ICD-10-CM | POA: Insufficient documentation

## 2010-07-09 DIAGNOSIS — Z79899 Other long term (current) drug therapy: Secondary | ICD-10-CM | POA: Insufficient documentation

## 2010-07-09 DIAGNOSIS — E119 Type 2 diabetes mellitus without complications: Secondary | ICD-10-CM | POA: Insufficient documentation

## 2010-07-09 DIAGNOSIS — R1110 Vomiting, unspecified: Secondary | ICD-10-CM

## 2010-07-09 DIAGNOSIS — N289 Disorder of kidney and ureter, unspecified: Secondary | ICD-10-CM | POA: Insufficient documentation

## 2010-07-09 DIAGNOSIS — E039 Hypothyroidism, unspecified: Secondary | ICD-10-CM | POA: Insufficient documentation

## 2010-07-09 DIAGNOSIS — R110 Nausea: Secondary | ICD-10-CM | POA: Insufficient documentation

## 2010-07-09 DIAGNOSIS — R259 Unspecified abnormal involuntary movements: Secondary | ICD-10-CM | POA: Insufficient documentation

## 2010-07-09 LAB — CBC
Hemoglobin: 9.7 g/dL — ABNORMAL LOW (ref 12.0–15.0)
MCH: 23.4 pg — ABNORMAL LOW (ref 26.0–34.0)
RBC: 4.15 MIL/uL (ref 3.87–5.11)
WBC: 7.5 10*3/uL (ref 4.0–10.5)

## 2010-07-09 LAB — URINALYSIS, ROUTINE W REFLEX MICROSCOPIC
Bilirubin Urine: NEGATIVE
Protein, ur: NEGATIVE mg/dL
Urobilinogen, UA: 0.2 mg/dL (ref 0.0–1.0)

## 2010-07-09 LAB — URINE MICROSCOPIC-ADD ON

## 2010-07-09 LAB — BASIC METABOLIC PANEL
CO2: 21 mEq/L (ref 19–32)
Chloride: 104 mEq/L (ref 96–112)
Creatinine, Ser: 3.18 mg/dL — ABNORMAL HIGH (ref 0.4–1.2)
GFR calc Af Amer: 19 mL/min — ABNORMAL LOW (ref >60)
Potassium: 5.1 mEq/L (ref 3.5–5.1)

## 2010-07-09 LAB — DIFFERENTIAL
Basophils Relative: 0 % (ref 0–1)
Lymphs Abs: 2.6 10*3/uL (ref 0.7–4.0)
Monocytes Relative: 7 % (ref 3–12)
Neutro Abs: 4.3 10*3/uL (ref 1.7–7.7)
Neutrophils Relative %: 57 % (ref 43–77)

## 2010-07-09 LAB — GLUCOSE, CAPILLARY
Glucose-Capillary: 109 mg/dL — ABNORMAL HIGH (ref 70–99)
Glucose-Capillary: 108 mg/dL — ABNORMAL HIGH (ref 70–99)

## 2010-07-09 MED ORDER — GLIMEPIRIDE 2 MG PO TABS: 2.0000 mg | ORAL_TABLET | ORAL | Status: DC

## 2010-07-09 NOTE — Assessment & Plan Note (Signed)
Doxepin OD on 3/29 with rhabdomyolysis and acute renal failure, has been receiving hemodialysis with last treatment on 4/21.  Plan was to stop as creatinine trended down from 11 to 4 range. Now with 2 days of vomiting, decreased po intake and not making urine (no output since 10 PM last evening). Need emergent labs, IV hydration and evaluation that can only be done in ER.  She did not want to go.

## 2010-07-09 NOTE — Assessment & Plan Note (Signed)
2 days of vomiting, no po intake and no void since yesterday.  Send to ER for labs, IV hydration, and possible admission.  She does not want to be admitted but her kidneys cannot take another insult.

## 2010-07-09 NOTE — Assessment & Plan Note (Signed)
Reports racing thoughts, improved with haldol during hospitalization, cannot find discharge summary from [place] is EMR system, she says that she was sent home on Prozac and Ativan.  Denies suicidal ideations today. Very upset about having to re-present to the ER with the possibility of admission.  Stated " my children need me"

## 2010-07-09 NOTE — Progress Notes (Signed)
  Subjective:    Patient ID: [name], female    DOB: [birth year], 40 y.o.   MRN: [id number]  HPI Discharged from [facility] inpatient stay after one month in [facility] for acute renal failure and liver failure secondary to rhabdomyolysis after a  Doxepin OD in a suicide attempt. Today she is here for 24 hours of no urinary output, 48 hours of vomiting and no po intake.  She has terrible right leg pain and profound fatigue that prevents her from participating in her own care.  She lives with her two boys 87 and 56.  41 year old had been jailed for a shop-lifting offense (not sure of this status), she was out of money even though she works 2 jobs and this was her given reason for her suicide attempt.  Her sister is with her, and she will transport her to the ER.   Review of Systems  Constitutional: Positive for activity change, appetite change and fatigue. Negative for fever and chills.  Respiratory: Positive for shortness of breath. Negative for chest tightness.   Cardiovascular: Positive for leg swelling. Negative for chest pain.  Gastrointestinal: Positive for nausea and vomiting. Negative for diarrhea.  Genitourinary: Positive for decreased urine volume.  Musculoskeletal:       Right leg pain  Psychiatric/Behavioral: Negative for suicidal ideas.       She does not want everyone to think that she is crazy       Objective:   Physical Exam  Constitutional:       Depressed, crying, obese AA female  Cardiovascular:       Tachy, regular  Pulmonary/Chest: Effort normal and breath sounds normal.  Musculoskeletal: She exhibits edema.  Psychiatric:       crying          Assessment & Plan:

## 2010-07-09 NOTE — Patient Instructions (Signed)
Straight to the ER, I called the Triage RN and she knows that you are coming.

## 2010-07-10 ENCOUNTER — Other Ambulatory Visit: Payer: Self-pay | Admitting: Family Medicine

## 2010-07-11 ENCOUNTER — Telehealth: Payer: Self-pay | Admitting: Family Medicine

## 2010-07-11 NOTE — Telephone Encounter (Signed)
Patient is much improved after IV fluids in the ER, she is back home and happy for that. Her right legs continues to be weak and painful, this has been the case since her time down after an overdose and subsequent rhabdomyolysis and ARF.  Home health PT is not indicated as she is not home bound completely will order PT at Outpatient facility.

## 2010-07-11 NOTE — Telephone Encounter (Signed)
Is concerned about leg and needs help with walker or cane and not being about to get up - feels like she might need PT

## 2010-07-12 ENCOUNTER — Telehealth: Payer: Self-pay | Admitting: Family Medicine

## 2010-07-12 NOTE — Telephone Encounter (Signed)
Just got out of the hospital last Saturday and checked herself into [facility] and got out Monday.  Was seen by susan on Monday.  Pt is concerned that her legs hurts and she was suppose to be getting physical therapy. She says she was getting Pt while in the hospital and doesn't understand why she hasn't started getting PT now that she is home. I assured her that Tereasa Coop has been working on it and she should get her boys at home to help her this weekend while they are there. Then next week she will likely hear something about the PT. Pt agrees.   It appears from the notes that Tereasa Coop has ordered outpatient PT and they will contact the patient with times and dates.

## 2010-07-12 NOTE — Telephone Encounter (Signed)
Pt checking status of PT, has not heard anything.

## 2010-07-12 NOTE — Telephone Encounter (Signed)
Mrs. 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  She need this faxed to the attn: Joaquin Music @ [phone]

## 2010-07-15 ENCOUNTER — Encounter: Payer: Self-pay | Admitting: Family Medicine

## 2010-07-15 ENCOUNTER — Other Ambulatory Visit: Payer: Self-pay | Admitting: Family Medicine

## 2010-07-15 DIAGNOSIS — N179 Acute kidney failure, unspecified: Secondary | ICD-10-CM

## 2010-07-15 NOTE — Discharge Summary (Signed)
[name], [name]               ACCOUNT NO.:  [id number]  MEDICAL RECORD NO.:  [id number]           PATIENT TYPE:  LOCATION:                                 FACILITY:  PHYSICIAN:  Carloyn Jaeger, MD     DATE OF BIRTH:  1970/06/02  DATE OF ADMISSION:  07/05/2010 DATE OF DISCHARGE:                              DISCHARGE SUMMARY   REASON FOR ADMISSION:  This is a transfer from the medical unit after patient had an overdose on doxepin and had a lengthy hospital stay of approximately 5 weeks and patient was transferred to the [facility] for further assessment of her intentional overdose.  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 AXIS II:  Deferred. AXIS III: 1. Acute renal failure, improving. 2. Acute renal failure, resolved. 3. Right thigh pain, resolving, from rhabdomyolysis. 4. History for hypothyroidism. 5. History for diabetes, type 2. 6. History for iron-deficiency anemia. 7. Hypertension. 8. Endometrial hyperplasia. 9. Menorrhagia. AXIS IV:  Primary financial stressors and low coping skills. AXIS V: 50.  SIGNIFICANT LABS:  Patient had elevated creatinine levels that trended down with still an elevated level on day of discharge at 3.7 with a BUN of 46.  SIGNIFICANT FINDINGS:  This is a fully alert and oriented African American female that appeared her stated age in no acute distress.  She was appropriately groomed and dressed.  Moderately good eye contact. Motor behavior was normal.  Speech had a normal rate, rhythm, and tone. Patient was no longer feeling overwhelmed or as depressed at the time of the overdose a month ago.  Convincingly and emphatically denied being suicidal.  Her judgment and insight appear to be intact.  Patient was admitted to the adult milieu.  We continued with her transfer medications.  We contacted patient's brother, Vincente Liberty, to  gather information, address any safety issues, and to provide information and he stated there were no drugs, weapons, or alcohol in the home and that she would be returning to live back home with him with her children. Patient denied any suicidal or homicidal thoughts or auditory or visual hallucinations.  She was advised to drink plenty of fluids and we continued to monitor her mood and affect.  On day of discharge, patient was fully alert and oriented, anxious to go home.  Appeared to have good insight.  She was pleasant, bright, and optimistic, looking forward to getting back home to her children, getting back to work.  She was aware that she needed to follow up with her primary care provider and states she already had an appointment with Dr. Tereasa Coop which was reinforced prior to discharge and we felt patient was stable to discharge.  DISCHARGE MEDICATIONS: 1. Ferrous sulfate 325 one daily. 2. Ativan 1 mg b.i.d. p.r.n. anxiety. 3. Calcium t.i.d. with meals. 4. Carvedilol 1 tablet b.i.d. with meals, 25 mg. 5. Amaryl 1 mg before breakfast. 6. Levothyroxine 200 mcg daily. 7. Albuterol inhaler 2 puffs every 4 hours as needed. 8. Prozac 20 mg taken three daily. 9. Tramadol 50 mg one every  8 hours p.r.n. for pain.  FOLLOWUP APPOINTMENT:  With [facility] on Wednesday, May 9th, at 5 p.m. at [phone] and also at [facility] on Wednesday, May 2nd, at phone number [phone], and follow up with her primary care provider, Dr. Zebedee Iba, for further lab work and adjustments in her medications.     Redgie Grayer, N.P.   ______________________________ Carloyn Jaeger, MD    JO/MEDQ  D:  07/09/2010  T:  07/09/2010  Job:  [id number]  Electronically Signed by Arnetha CourserP. on 07/15/2010 09:37:23 AM Electronically Signed by Carloyn Jaeger MD on 07/15/2010 04:44:57 PM

## 2010-07-15 NOTE — Assessment & Plan Note (Signed)
Patient has no direction on follow up with renal, she was last dialyzed 2 weeks ago with last creatinine int he 3 range.  WIll need to make apt with them to see her on a outpatient basis.

## 2010-07-15 NOTE — Telephone Encounter (Signed)
Letter completed, admin pool has been asked to print and fax to the name listed.

## 2010-07-17 ENCOUNTER — Ambulatory Visit: Payer: BC Managed Care – PPO | Attending: Family Medicine | Admitting: Physical Therapy

## 2010-07-23 ENCOUNTER — Inpatient Hospital Stay: Payer: BC Managed Care – PPO | Admitting: Family Medicine

## 2010-07-23 NOTE — Op Note (Signed)
[name], [name]               ACCOUNT NO.:  [id number]   MEDICAL RECORD NO.:  [id number]          PATIENT TYPE:  AMB   LOCATION:  SDC                           FACILITY:  [facility]   PHYSICIAN:  Lynnda Shields, MD DATE OF BIRTH:  1971-03-09   DATE OF PROCEDURE:  09/21/2008  DATE OF DISCHARGE:  09/21/2008                               OPERATIVE REPORT   PREOPERATIVE DIAGNOSIS:  Dysfunctional uterine bleeding.   POSTOPERATIVE DIAGNOSIS:  Dysfunctional uterine bleeding.   PROCEDURES:  Hysteroscopy, dilation and curettage, endometrial ablation  using ThermaChoice.   SURGEON:  Lynnda Shields, MD   ANESTHESIOLOGIST:  Charlett Lango, MD   ANESTHESIA:  General LMA.   INDICATIONS:  The patient is a 40 year old gravida 4, para 2-0-2-2 with  a long history of dysfunctional uterine bleeding leading to symptomatic  anemia and requiring transfusion.  The patient was initially treated  with Provera, but this did not help her bleeding.  She does have a  history of morbid obesity, type 2 diabetes, and hypothyroidism.  The  patient did have an endometrial biopsy in the office though it was just  notable for secretory endometrium without any hyperplasia or atypia.  When counseled about her options, the patient chose ThermaChoice  endometrial ablation.  The risks of surgery including bleeding,  infection, injury to surrounding organs, need for additional procedures  were explained to the patient and written informed consent was obtained.   FINDINGS:  Diffusely proliferative endometrium, uterus sounded to  12.5cm.   IV FLUIDS:  1500 mL of lactated Ringer's.   BLOOD LOSS:  Minimal.   URINE OUTPUT:  Minimal at the end of procedure.   SPECIMENS:  Endometrial curettings, which were sent to Pathology.   COMPLICATIONS:  None immediate.   PROCEDURE DETAILS:  The patient was taken to the operating room where  general anesthesia was administered with LMA and found to be adequate.  She  was then placed in the dorsal lithotomy position and prepped and  draped in a sterile manner.  Attention was then turned to the patient's  pelvis where a vaginal speculum was placed and her cervix was grasped  with a tenaculum on the anterior lip.  A paracervical block using 0.5%  Marcaine was administered, total of 30 mL was used.  The cervix was then  sounded to 12.5 cm and decision was made to proceed with the  ThermaChoice endometrial ablation as requested by the patient.  Dilation  and curettage was done at this point and a moderate amount of  endometrial curettings was obtained.  At this point, the ThermaChoice  endometrial ablation was carried out according to protocol.  A total of  35 mL of liquid was used to inflate the balloon and the pressure was  maintained was about 150 mmHg.  The cycle was for 8 minutes and a  temperature of 87 degrees centigrade.  After the ablation was done, the  hysteroscope was used to examine the endometrium using lactated Ringer's  as a distention medium.  There was some ablation of the endometrium that  was noted at this  point.  All instruments were then removed from the  patient's vagina.  There was minimal bleeding after the tenaculum was  removed and this was ameliorated using pressure.  The lactated Ringer's  deficit was 100 mL.  The patient tolerated the procedure well.  Sponge,  instrument and needle counts were correct x2.  She was taken to the  recovery room awake, extubated in stable condition.  The patient has  been scheduled to follow up in [facility] on October 12, 2008, at  2:45 p.m.      Lynnda Shields, MD  Electronically Signed     UAA/MEDQ  D:  09/21/2008  T:  09/22/2008  Job:  [id number]

## 2010-07-23 NOTE — Telephone Encounter (Signed)
Referral in computer, but was put in by different provider (siler, Laurice Record). [facility], they should also have it in their system. [name]

## 2010-07-23 NOTE — Discharge Summary (Signed)
[name], [name] NO.:  [id number]   MEDICAL RECORD NO.:  [id number]          PATIENT TYPE:  INP   LOCATION:  [id number]                         FACILITY:  [facility]   PHYSICIAN:  Azzie Roup, MD      DATE OF BIRTH:  [birth year]   DATE OF ADMISSION:  02/05/2007  DATE OF DISCHARGE:  02/06/2007                               DISCHARGE SUMMARY   PRIMARY CARE PHYSICIAN:  Dr. Mayra Neer at the [facility].   DISCHARGE DIAGNOSES:  1. Anemia.  2. Hypothyroidism.  3. Hypertension.  4. Diabetes mellitus type 2.  5. Depression.   PROCEDURES:  None.   CONSULTATIONS:  None.   DISCHARGE MEDICATIONS:  1. Enalapril 10 mg p.o. daily.  2. Metformin 1000 mg p.o. b.i.d.  3. Fluoxetine 20 mg p.o. daily.  4. Levothyroxine 225 mcg p.o. daily.  Note that this is an increase      from her admission dose of 200 mcg daily.  5. Ativan 1 mg p.o. b.i.d. p.r.n. anxiety.   BRIEF HOSPITAL COURSE:  This is a 40 year old female admitted with  anemia.  1. Anemia.  The patient was transfused with 2 units of packed red      blood cells overnight.  Her admission hemoglobin of 6.3 rose only      to 7.5, so a third unit was transfused and the hemoglobin was      rechecked.  Results of the hemoglobin were not available at the      time of dictation.  Patient had a normal TIBC, which is      inconsistent with iron-deficiency anemia and an inappropriately      normal retic count in this anemic patient.  Would advise continued      workup of anemia as an outpatient.  2. Hypothyroidism.  Patient had an elevated TSH of 9.894 during this      admission, so her levothyroxine dose was increased from 200 to 225      mcg.  3. Diabetes mellitus type 2.  Hemoglobin A1c of 7.0 from November 26      in clinic was noted.  We continued her home dose of Metformin and      started her on sensitive sliding scale insulin on admission.  Her      CBGs ranged from 169 to 284 overnight on  the first night of      admission, so we changed her to intermediate sliding scale for the      second day.  4. Depression.  Continued home dose of fluoxetine during this      admission and also provided p.r.n. lorazepam during this admission.      Patient was stable during admission.  5. Hypertension.  It was well controlled on admission, but was low on      the morning after admission with a blood pressure of 94/55, so we      held her home dose of enalapril.   LABS AND STUDIES:  Pre-transfusion retic percent 1.4, RBC 3.90.  Retic  absolute 54.6.  Folate greater  than 20, B12 330, TSH 9.894, iron 15,  TIBC 436, percent saturation 3, UIBC 421.  After transfusion of 2 red  blood cells, hemoglobin 7.5, hematocrit 24.7, sodium 135, potassium 4.1,  chloride 104, bicarb 25, BUN 9, creatinine 0.83, glucose 187, calcium  8.7.   DISCHARGE INSTRUCTIONS:  The patient is discharged to home with no  restrictions in activity and is advised to keep a diabetes diet.  She  should follow up for care with Dr. Mayra Neer at the [facility] and the practice center has been alerted and will  call the patient with an appointment.  At that appointment, continued  workup of her anemia should be pursued.      Graciella Belton, MD  Electronically Signed     ______________________________  Azzie Roup, MD    MO/MEDQ  D:  02/06/2007  T:  02/06/2007  Job:  [id number]   cc:   Mayra Neer, M.D.
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
## 2010-07-23 NOTE — Discharge Summary (Signed)
[name], FIX NO.:  [id number]   MEDICAL RECORD NO.:  [id number]          PATIENT TYPE:  OBV   LOCATION:  [id number]                         FACILITY:  [facility]   PHYSICIAN:  Samantha La, MD        DATE OF BIRTH:  [birth year]   DATE OF ADMISSION:  01/01/2008  DATE OF DISCHARGE:  01/03/2008                               DISCHARGE SUMMARY   PRIMARY CARE PHYSICIAN:  Mayra Neer, MD, [facility].   CONSULTANTS:  None.   DISCHARGE DIAGNOSES:  1. Symptomatic anemia, status post 3 units packed red blood cells.  2. Symptomatic hypothyroidism with a TSH of 15.  3. Morbid obesity.  4. Hypertension.  5. Menorrhagia.  6. Noncompliance.  7. Significant anxiety.  8. Depression.  9. Multiple sexually transmitted diseases including Trichomonas.  10.Severe chronic back pain.   DISCHARGE MEDICATIONS:  1. Doxepin 25 mg p.o. 1-2 caps q.8 h.  2. Enalapril 10 mg p.o. daily  3. Levoxyl 200 mcg p.o. daily.  4. Metformin 1 g p.o. b.i.d.  5. Prozac 60 mg p.o. daily.  6. Ativan 1 mg p.o. b.i.d. p.r.n. anxiety.  7. Trazodone 50 mg p.o. nightly.  8. Zyrtec 10 mg p.o. daily p.r.n.  9. ProAir 2 puffs q.4 h. p.r.n. wheezing.  10.Ferrous sulfate 324 mg p.o. b.i.d. with orange juice.   SIGNIFICANT LABORATORY DATA DURING THIS HOSPITALIZATION:  Admission  hemoglobin was 6.2, hematocrit 21.0, MCV 63.8, RDW 18, and platelet  count of 530.  Coags were negative on admission.  LDH was 167.  Smear  showed only polychromasia and retic count was 1.4%.  CMP was negative  except for an elevated glucose A1c at 8.3.  TSH 15.882.  Ferritin was 2.  Lipid profile showed total cholesterol of 163, triglycerides 188, HDL  44, and LDL 81.  After 3 units of packed red blood cells and 1500 mg of  IV iron dextran, the hemoglobin increased to 9.1, hematocrit 29.2, MCV  69.2, RDW 23.9, and platelet count 575.   REASON FOR ADMISSION:  Please see dictated H&P for further details.  Briefly,  this is a morbidly obese 40 year old African American female  with significant noncompliance issues, who was seen in clinic the day  prior to admission for routine followup and did admit to some fatigue.  Hemoglobin at that time was 5.9 and she was asked to come into the  hospital for blood transfusion.  She declined until the next day and was  directly admitted on January 01, 2008, for this.  Upon admission, she  got 2 peripheral IVs and was given a saline flush, which prompted  significant fire of my arm.  The patient struggled with any IV  infusion including saline, but test dose of the iron at the beginning of  1 unit of packed red blood cells and took approximately 8 hours and  multiple IV changes before it was determined that the patient was  allergic to the catheter tips and once she got a Jelco IV catheter, she  was pain free.  The test dose of the iron  went off without symptoms and  she did eventually end up getting 1500 mg of IV iron dextran along with  3 units of packed red blood cells.  Four were ordered because of the  issues with IV, she was unable to obtain the first one.  She tolerated  the transfusions without issue.   The patient has had a workup in the past for her anemia of 2 and  including a hemoglobin electrophoresis, but she has not had a  transvaginal ultrasound and it was felt that her anemia was secondary to  fairly significant menorrhagia over the past few years and noncompliance  with ferrous sulfate.  The patient will be discharged home today with  follow up with her primary care physician.  In the meantime, before that  followup on January 18, 2008, she will be referred for a transvaginal  ultrasound in order to evaluate structural abnormalities that may be  causing her significant menorrhagia.   The patient was also encouraged to take her medications as prescribed.  She is well known to me and has a significant history of not taking her  medicines  appropriately.  It is evidenced by TSH of 15 when she was  previously 6 with no change of medication that she has not been taking  it recently.  She expressed understanding of the importance of  medication compliance and will hopefully start taking her medications.  On followup, I will recheck a TSH to see if this has occurred and I have  not considered increasing if she does endorse strict compliance.   FOLLOWUP:  The patient has an appointment with Dr. Brigitte Pulse on January 18, 2008, at 2:15 p.m.      Mayra Neer, M.D.  Electronically Signed      Samantha La, MD  Electronically Signed    KS/MEDQ  D:  01/03/2008  T:  01/03/2008  Job:  [id number]

## 2010-07-23 NOTE — Group Therapy Note (Signed)
NAMELODELL, MAGNIN NO.:  [id number]   MEDICAL RECORD NO.:  [id number]          PATIENT TYPE:  WOC   LOCATION:  [facility]                   FACILITY:  [facility]   PHYSICIAN:  Emily Filbert, MD        DATE OF BIRTH:  1970/12/05   DATE OF SERVICE:  08/24/2008                                  CLINIC NOTE   [name] is a 40 year old separated black gravida 4, para 2, abortus 2  who was sent here as a referral from Dr. Mayra Neer.  This patient  has a history of menorrhagia for the last 2 years.  Please note in  November 2008, she required 2 units of packed red blood cells and on  December 17, 2007, she required 4 units of packed blood cells.  An  ultrasound was done which showed an endometrial lining measuring 17 mm  and she was referred here for further evaluation.  It appears according  to some of the medical records that a von Willebrand panel was done.  If  it was not done, it certainly does need to be done.  I have offered to  do it today, but [name] is tearful and says that she cannot afford  anything.  I have agreed to in fact not charge her for this visit here  today.   PAST MEDICAL HISTORY:  Significant for morbid obesity, noncompliant  diabetic, noncompliant hypertensive, she has occult gallstones and is to  see a Psychologist, sport and exercise tomorrow.  She has been treating for anxiety as well.   PHYSICAL EXAMINATION:  Her blood pressure is 152/90, pulse 115.  She is  tearful.  She weighs 373 pounds.  I have discussed with her at length  that based on her recurrent anemia due to her menorrhagia and of  thickened endometrium that she needs a D and C and hysteroscopy at the  minimum.  I have also suggested von Willebrand panel and I would  consider doing an endometrial ablation at the time of D and C and  hysteroscopy, however, in spite of not charging her for this visit, she  is adamant that she cannot afford surgery.  She refuses to speak to a  Music therapist.  I have  given her a prescription for generic Provera  10 mg to be taken daily 15 days out of the month to help with her  bleeding.  However, she says she would not fill this prescription.   In any event, I thank you for your referral.  If there is anything I can  do in the future, feel free to contact me.      Emily Filbert, MD     MCD/MEDQ  D:  08/24/2008  T:  08/25/2008  Job:  [id number]   cc:   Mayra Neer, MD
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
## 2010-07-23 NOTE — Discharge Summary (Signed)
[name] NO.:  [id number]   MEDICAL RECORD NO.:  [id number]          PATIENT TYPE:  WOC   LOCATION:  WOC                          FACILITY:  [facility]   PHYSICIAN:  Dickie La, MD        DATE OF BIRTH:  01-Apr-1970   DATE OF ADMISSION:  08/28/2008  DATE OF DISCHARGE:  08/29/2008                               DISCHARGE SUMMARY   PRIMARY CARE PHYSICIAN:  Dr. Brigitte Pulse at the [facility].   DISCHARGE DIAGNOSES:  1. Syncope.  2. Iron-deficiency anemia.  3. Menorrhagia.  4. Anxiety.  5. Depression.  6. Hypertension.  7. Hypothyroidism.  8. Diabetes.  9. Transaminitis.   DISCHARGE MEDICATIONS:  1. Levoxyl 200 mcg by mouth daily.  2. Prozac 20 mg, 3 by mouth daily.  3. Ativan 1 mg, 1 by mouth twice daily as needed for anxiety.  4. Trazodone 50 mg by mouth at bedtime.  5. Iron supplementation by mouth twice daily.  6. Albuterol 2 puffs every 4 hours as needed for wheezing.  7. Metformin 1000 mg by mouth twice daily.  8. Enalapril 10 mg take one half tablet by mouth daily.  9. Colace 100 mg by mouth twice daily for constipation.   STUDIES:  1. CT of the head on August 28, 2008.  Impression, no acute intracranial      abnormality.  2. Portable left tib/fib x-ray on August 28, 2008.  Impression, no acute      skeletal abnormalities.  Severe degenerative changes in the knee      joint for age resolved.   LABORATORY DATA:  CMP upon discharge within normal limits.  White blood  cell count 10.0, hemoglobin 9.6, hematocrit 30, and platelets 590.  Urine drug screen positive for opiates.  Hemoglobin A1c 8.  Cardiac  enzymes negative x2.  Iron 10, TIBC 369.  Percent saturation 3, B12 of  283, Folate more than 20.  Ferritin 7, TSH 9.259.  Urine pregnancy test  negative.  Initial hemoglobin 7.9.   BRIEF HOSPITAL COURSE:  [name] is a 40 year old morbidly obese  African American female with a known history of anemia.  There was no  history of iron-deficiency anemia, hypothyroidism, anxiety, and  depression that presented after syncope and fall in her home.  Please  see H and P for further details.  1. Questionable syncopal event.  The patient's story of the syncopal      event was vague and inconsistent.  However, syncopal per workup was      vague and inconsistent.  Please see H and P for further details of      this.  A syncopal workup was completed to evaluate an etiology for      this.  CT of her head was negative as well as cardiac enzymes.  The      patient was known to be anemic, secondary to menorrhagia and her      hemoglobin was found to be 7.9 upon admission.  This is actually      higher than she has  been many times in the past.  She was also      found to have a fairly high TSH indicating that she had not been      taking her hypothyroid medications.  She did have a low blood      pressure upon admission in the emergency department but admits that      she had just restarted her hypertensive medication after being off      of this medicine for several weeks.  The patient also admitted to      taking her prescribed Ativan for anxiety that night, the night of      admission as well as drinking alcohol.  The patient denies any type      of seizure activity.  The patient's syncopal episode was likely due      to several factors including medication noncompliance resulting in      worsened anemia and hypothyroidism as well as Ativan and alcohol      use, as well as lower blood pressure after taking her hypertensive      medicines that she had not been taking for several weeks.  The      patient had no more syncopal episodes during her stay in the      hospital.  2. Iron-deficiency anemia.  This has been worked up in the past.  She      has required several transfusions with packed red blood cells and      iron in the past due to her noncompliance with taking iron      supplementation and her refusal to  undergo surgery.  The patient      was actually seen by Dr. Hulan Fray, on August 24, 2008, who recommended      that time that the patient to undergo D and C and hysteroscopy to      manage her menorrhagia and anemia.  The patient became very tearful      during that visit and stated that she would not be able to afford      the surgery.  After a lengthy conversation with the patient she did      express interest in biting the bullet and undergoing the      procedure in order to manage her anemia and menorrhagia.  She will      work on Government social research officer for this to happen.  Until surgery, the      patient was instructed to continue iron supplementation for      constipation.  3. Anxiety/depression.  The patient has severe anxiety and depression      as well as noted severe social stressors including her family, her      chronic illnesses, and financial issues.  We do suspect that her      psychiatric issues play a big role in her medication and health      care noncompliance.  Her psych medicines were continued throughout      the stay.  4. Hypertension.  The patient has a history of hypertension.  She has      previously been prescribed enalapril.  She admits that she usually      does not take this medicine but I did start taking it a few days      ago.  She is now borderline hypotensive since taking the medicine      she will be discharged on this medication of half of her previous  dose.  5. Hypothyroidism.  The patient's TSH was 9.259, indicating that she      has not been taking her medications.  She was counseled on the      importance of this medicine and restarted.  6. Diabetes mellitus type 2.  The patient's A1c was 8.0, again      suggesting that the patient has either been noncompliant with her      medication or her diet.  She was restarted on metformin and this      will be monitored on an outpatient basis.  7. Transaminitis.  The patient had a very slightly elevated liver       function tests upon admission that did decrease to normal before      discharge.   FOLLOWUP:  The patient was instructed to follow up with Dr. Michaele Offer [facility] in 1-2 weeks.   FOLLOWUP ISSUES:  1. The arrangement of hysteroscopy and D and C for menorrhagia and      ultimate treatment of iron-deficiency anemia.  2. Follow up hypertension.  3. Follow up diabetes management.  4. Recheck TSH in 1-2 months.      Briscoe Deutscher, MD  Electronically Signed      Dickie La, MD  Electronically Signed    EW/MEDQ  D:  09/03/2008  T:  09/04/2008  Job:  [id number]

## 2010-07-23 NOTE — Group Therapy Note (Signed)
NAMEBUSRA, DELAUGHTER NO.:  [id number]   MEDICAL RECORD NO.:  [id number]          PATIENT TYPE:  WOC   LOCATION:  [facility]                   FACILITY:  [facility]   PHYSICIAN:  Emily Filbert, MD        DATE OF BIRTH:  [birth year]   DATE OF SERVICE:  09/07/2008                                  CLINIC NOTE   Ms. Delahoz is a 40 year old separated black gravida 4, para 2, abortus 2  who was referred here by Dr. Lupita Raider.  Initially this patient was  seen on 08/24/2008 with a history of menorrhagia for the last 2 years.  Over the last couple years she had required a total of 6 units of packed  blood cells for dysfunctional uterine bleeding.  An ultrasound had been  done which showed an endometrial lining of 17 mm.  At that point I  discussed with Ms. Leboeuf doing a hysteroscopy, D and C and endometrial  ablation.  She is very tearful and said that she could not afford to  have surgery.  I then gave her a prescription for Provera and told her  to taking it cyclically.  She said that she could not afford that.  In  fact I did not charge her for the visit seen here but she left.  In the  interim since last month she has passed out due to anemia and been  admitted to the hospital and had another 3 units of packed red blood  cells transfused.  She is now agreeable to have surgery.   PAST MEDICAL HISTORY:  Morbid obesity, adult onset diabetes,  hypothyroidism.  Please note that during the last year she has rarely  taken her Levoxyl but she was restarted in the hospital during her  recent stay.  She has not had that TSH checked since leaving the  hospital.  Other medical problems include anxiety, gallstones,  hypertension, anemia and dysfunctional uterine bleeding.   ALLERGIES:  She is allergic to IV CATHETER and says that she must have a  radio-opaque IV catheter any time she has one.  She denies a latex  allergy or other medicine allergy.   SOCIAL HISTORY:  She has rare  alcohol, no tobacco, no drugs.   PAST SURGICAL HISTORY:  She has had two D and Cs for evaluation and  treatment of vaginal bleeding.   MEDICATIONS:  1. Levothroid one daily.  2. Ativan as necessary.  3. Doxepin 25 mg as necessary.  4. Metformin she takes 1000 mg b.i.d.  5. Prozac she takes 60 mg daily.  6. Enalapril daily.  7. Albuterol inhaler.   PHYSICAL EXAM:  VITAL SIGNS:  Weight 363 pounds, height 5 feet 10  inches, blood pressure 131/79, pulse 77.  HEENT:  Normal.  HEART:  Regular rate and rhythm.  LUNGS:  Clear to auscultation bilaterally.  ABDOMEN:  Obese, benign.  EXTERNAL GENITALIA:  Noncontributory.   ASSESSMENT AND PLAN:  Dysfunctional uterine bleeding resulting in anemia  requiring transfusion.  I have again discussed and given her handouts on  ThermaChoice endometrial ablation.  I have described hysteroscopy  and D  and C and its risks.  Due to her severe bleeding history I have told her  that I will not operate unless I have the results of a von Willebrand's  panel and a TSH done preoperatively.  She is agreeable to these and  these will be drawn today.  She will need an anesthesia preop visit  prior to surgery.      Emily Filbert, MD     MCD/MEDQ  D:  09/07/2008  T:  09/07/2008  Job:  [id number]

## 2010-07-23 NOTE — H&P (Signed)
[name], [name]               ACCOUNT NO.:  [id number]   MEDICAL RECORD NO.:  [id number]          PATIENT TYPE:  INP   LOCATION:  [id number]                         FACILITY:  [facility]   PHYSICIAN:  Samantha La, MD        DATE OF BIRTH:  08-10-1970   DATE OF ADMISSION:  02/05/2007  DATE OF DISCHARGE:                              HISTORY & PHYSICAL   CHIEF COMPLAINT:  Anemia.   PRIMARY CARE PHYSICIAN:  [name], M.D. at the [name].   HISTORY OF PRESENT ILLNESS:  This is a 40 year old female with long-  standing anemia, found to have a hemoglobin of 6.3 in clinic on  Wednesday.  She was a direct admit to the hospital today for a  transfusion and anemia workup.  In the past, the patient has been anemic  and prescribed p.o. iron, but she has not taken these pills due to  constipation.  The patient has been amenorrheic since August 14.  Prior  to this, she was extremely regular in her periods, with a very heavy 5-  day menses occurring exactly every 28 days.  Of note, she missed one  period in June, and her last period in August was very heavy.  Negative  urine pregnancy test in the family practice office on November 26.  The  patient denies bruises and bleeding, but notes that she has been  fatigued for several weeks, and has had shortness of breath, chest pain  and palpitations approximately 3 times per week.  She has attributed  these symptoms to stress.  In addition, she admits to cold intolerance  during the day and heat intolerance at night.  The patient has had a  productive cough for approximately one week.   PAST MEDICAL HISTORY:  1. Hypertension.  2. Hypothyroidism.  3. Diabetes mellitus type 2.  4. Depression.  5. Asthma.  6. Anemia.  7. C-section x2 in the distant past, with no other surgeries or      hospitalizations.   FAMILY HISTORY:  Positive for hypertension in mother and father.  Multiple sclerosis in sister.  Negative for blood  dyscrasias, sickle  cell disease, coronary artery disease, and thyroid disease.   SOCIAL HISTORY:  Married, lives with husband and two sons.  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  No alcohol, tobacco or drug  use.   MEDICATIONS:  1. Enalapril 10 mg p.o. daily.  2. Levothyroxine 200 mcg p.o. daily.  3. Metformin 1000 mg p.o. daily.  4. Fluoxetine 20 mg p.o. daily.  5. Lorazepam 1 mg p.o. b.i.d. p.r.n. anxiety (takes 1 approximately      every 3 days).  6. Albuterol p.r.n. wheezing and shortness of breath (uses daily for      the past week, typically once per week).   ALLERGIES:  No known drug allergies.   REVIEW OF SYSTEMS:  Per HPI, and positive for left knee pain and  swelling x1-2 months.  Negative for rash, headache, vision changes,  fever, chills, night sweats, appetite changes and pica.  PHYSICAL EXAMINATION:  VITAL SIGNS:  Temperature 98.5, heart rate 90,  respirations 18, blood pressure 137/81.  O2 saturation 100% on room air.  CBG 284.  GENERAL:  Alert and oriented, in no acute distress.  CARDIO:  Regular rate and rhythm with a 3/6 systolic murmur.  PULMONARY:  Clear to auscultation bilaterally.  Normal work of  breathing.  No retractions.  ABDOMINAL:  Positive bowel sounds.  Nontender, obese, nondistended.  No  hepatosplenomegaly.  EXTREMITIES:  No edema.  Nontender calves.  Point tenderness without  erythema or swelling on the left patella.  PSYCH:  Mildly anxious affect.   LABORATORY DATA AND STUDIES:  None.   ASSESSMENT AND PLAN:  This is a 40 year old female with anemia, admitted  for transfusion.  1. Anemia:  We will draw BMET, CBC with differential, peripheral blood      smear, iron panel, reticula site count, B12 folate prior to      transfusion.  Then, transfuse 2 units and recheck hemoglobin and      hematocrit.  2. Hypothyroidism:  We will draw TSH prior to transfusion.  Continue      home dose of levothyroxine.  3. Diabetes mellitus  type 2:  Hyperglycemic today.  Hemoglobin A1c is      7.0 on November 26.  We will continue home dose of metformin, and      use sensitive sliding scale insulin with h.s. coverage.  4. Depression:  We will continue home dose of fluoxetine.  Given      anxiety symptoms, we will also continue home p.r.n. lorazepam.  5. Hypertension:  Well controlled on admission.  We will check BMET      and continue home dose of enalapril.  6. Prophylaxis:  Proton pump inhibitor and sequential compression      devices.  7. FEN/GI:  Carb-modified diet.  Heparin-lock IV.  8. Disposition:  Likely home tomorrow after transfusion.  Anemia      workup can be continued as an outpatient.      Samantha Belton, MD  Electronically Signed      Samantha La, MD  Electronically Signed    MO/MEDQ  D:  02/05/2007  T:  02/06/2007  Job:  [id number]

## 2010-07-24 NOTE — Discharge Summary (Signed)
[name], [name]               ACCOUNT NO.:  [id number]  MEDICAL RECORD NO.:  [id number]           PATIENT TYPE:  I  LOCATION:  [id number]                         FACILITY:  [facility]  PHYSICIAN:  [name] [name], M.D.DATE OF BIRTH:  [birth year]  DATE OF ADMISSION:  06/06/2010 DATE OF DISCHARGE:  07/05/2010                              DISCHARGE SUMMARY   DISCHARGE DIAGNOSES: 1. 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 2. Acute liver failure, resolved. 3. Right thigh pain, resolving. 4. Intentional overdose of doxepin. 5. Depression. 6. History of hypothyroidism. 7. History of diabetes type 2. 8. Obesity. 9. History of iron-deficiency anemia. 10.History of anxiety. 11.History of obsessive-compulsive disorder. 12.Hypertension. 13.History of endometrial hyperplasia. 14.History of menorrhagia.  DISCHARGE MEDICATIONS:  New medications at discharge, 1. Tylenol 650 p.o. q.6 h. as needed. 2. Tums 200 mg 2 tablets p.o. t.i.d. with meals. 3. Coreg 25 mg p.o. twice daily with meals. 4. Ferrous sulfate 325 mg 1 tab p.o. daily. 5. Amaryl 1 mg tabs 1 tab p.o. daily with breakfast. 6. Zofran 8 mg tab ODT 8 mg p.o. every 6 hours as needed for nausea. 7. MiraLax 17 g p.o. daily as needed for constipation. 8. Tramadol 50 mg tabs 50 mg p.o. every 8 hours as needed for pain and     headache.  Home medications continued, 1. Ativan 1 mg p.o. twice daily. 2. Synthroid 200 mcg 1 tablet p.o. daily. 3. Albuterol 2 puffs inhaled q.4 h. as needed for shortness of breath. 4. Prozac 20 mg capsules 3 tablets p.o. daily.  Home medications stopped, 1. Metformin 1000 mg 1 tablet b.i.d. 2. Doxepin 25 mg 1-2 capsules p.o. q.8 h. 3. Trazodone 50 mg 1 tablet p.o. daily at bedtime. 4. Percocet 5/325 one tab p.o. every 6 hours as needed. 5. Enalapril 10 mg 1 tablet p.o. daily.  PERTINENT  LABORATORY DATA:  At discharge, the patient's creatinine is 4.98 down for the peak creatinine of 11, during the hospital stay the last checked AST 15, ALT 53, CK last check was 383 down from peak of greater than 50,000, potassium at discharge 4.5, BUN at discharge 43. Hemoglobin at discharge 9 and hematocrit 30.4.  Urine culture at discharge is no growth today, it is final.  CONSULTATIONS: 1. Renal. 2. Psychiatric. 3. Orthopedics.  BRIEF HOSPITAL COURSE:  The patient is a 41 year old female who was admitted with following intentional overdose with 50-60 doxepin and 4 metformin tabs.  She was found down and brought in and found to have rhabdomyolysis with CK greater than 50,000 acute liver failure and acute renal failure. 1. Rhabdomyolysis.  The patient's rhabdomyolysis secondary to being     down for unknown amount of time with a CK greater than 50,000.  She     was hydrated during her hospital stay.  Her CK came down at last     check about 3 weeks into her hospital stay was 383.  She does have     some residual pain on her right thigh for which she was down on the  ground.  She was evaluated during her hospital stay for a possible     compartment syndrome that was ruled out with Ortho consult and they     signed off within 2 days. 2. Acute renal failure.  Acute renal failure is improving.  Acute     renal failure secondary to rhabdomyolysis, intrinsic renal failure.     Renal was consulted.  The patient required hemodialysis during her     admission, initially fairly regular every Monday, Wednesday, and     Friday and the patient remained inpatient to carefully adjust her     regimen and treatment in response to her renal function.  Slowly,     her renal function improved.  Dialysis was placed.  Repeat     creatinine was 11.  At discharge the patient's creatinine came     down.  Her last course of dialysis was Saturday June 29, 2010.     The recommendation is if the patient's  condition continued to trend     down and she no longer required dialysis, she was expected to     improve.  During dialysis, the patient was also given Aranesp and     IV iron.  The patient because of her renal failure also had     elevated phosphate and she was given Tums. 3. Acute liver failure.  The patient's AST and ALT were elevated on     admission.  The Foley trend down with IV fluid hydration probably     acute liver failure was also secondary to rhabdomyolysis.  Last     checked the AST was 150, ALT about 53. 4. Depression.  The patient has a known history of depression and     anxiety.  During this hospital stay, she was evaluated by Psych and     initial recommendation was Inpatient Behavior Health following     that.  Once the patient was medically stable for transfer, the     patient had intermittent bouts of depression and likely hypomania     concerning for possible bipolar depression.  During the week prior     to transfer, she required Haldol and she was given 1 mg IV b.i.d.     with the instructions to have additional 1 mg at bedtime as needed     for anxiety, insomnia, nausea, and vomiting.  The patient responded     well to the Haldol. 5. Anemia.  The patient has a known history of iron deficiency anemia.     She was continued on iron during her hospital stay p.o. and     occasionally IV.  Plan is for her to discharge to home and continue     iron.  Discharge hemoglobin as mentioned above. 6. Diabetes.  The patient has no history of diabetes type 2.  Because     of her acute renal failure, metformin was held.  She was initially     on a sliding scale insulin, required very little sliding scale and     was therefore transitioned to Amaryl 1 mg p.o. daily.  Fasting     blood sugars are well-controlled with Amaryl. 7. Hypertension.  The patient has a known history of hypertension and     lisinopril was discontinued because of her acute renal failure.     She was  started on Coreg initially 6.25 mg p.o. b.i.d., she     required at discharge 25 mg p.o. b.i.d.  DISCHARGE INSTRUCTIONS:  The patient is discharged to [facility] for at least a 72-day stay, to participate in group, she was instructed to follow up with her primary and care provider, Dr. Tereasa Coop at [facility] and an appointment will be scheduled for her to follow up in the next 2 weeks.  ACTIVITY:  She had no limitations on activity.  DIET:  No limitations on diet.  FOLLOWUP:  The patient will follow up with Dr. Tereasa Coop at [facility] within the next 2 weeks.  She has been transferred to [place] for inpatient therapy and she has been transferred in stable medical condition. 1. Follow-up miragaine HAs: Pt dicharged with instructions to discontinue Doxepin.     ______________________________ Boykin Nearing, MD   ______________________________ [name] Maida Widger, M.D.    JF/MEDQ  D:  07/05/2010  T:  07/05/2010  Job:  [id number]  Electronically Signed by Boykin Nearing MD on 07/19/2010 03:16:25 PM Electronically Signed by Lissa Morales M.D. on 07/24/2010 05:36:17 PM

## 2010-07-26 ENCOUNTER — Ambulatory Visit (INDEPENDENT_AMBULATORY_CARE_PROVIDER_SITE_OTHER): Payer: BC Managed Care – PPO | Admitting: Family Medicine

## 2010-07-26 ENCOUNTER — Encounter: Payer: Self-pay | Admitting: Family Medicine

## 2010-07-26 VITALS — BP 162/96 | HR 96 | Temp 99.0°F | Wt 347.0 lb

## 2010-07-26 DIAGNOSIS — F339 Major depressive disorder, recurrent, unspecified: Secondary | ICD-10-CM

## 2010-07-26 DIAGNOSIS — N8500 Endometrial hyperplasia, unspecified: Secondary | ICD-10-CM

## 2010-07-26 DIAGNOSIS — I10 Essential (primary) hypertension: Secondary | ICD-10-CM

## 2010-07-26 DIAGNOSIS — R1110 Vomiting, unspecified: Secondary | ICD-10-CM

## 2010-07-26 DIAGNOSIS — E118 Type 2 diabetes mellitus with unspecified complications: Secondary | ICD-10-CM

## 2010-07-26 DIAGNOSIS — N179 Acute kidney failure, unspecified: Secondary | ICD-10-CM

## 2010-07-26 DIAGNOSIS — E1165 Type 2 diabetes mellitus with hyperglycemia: Secondary | ICD-10-CM

## 2010-07-26 DIAGNOSIS — D509 Iron deficiency anemia, unspecified: Secondary | ICD-10-CM

## 2010-07-26 LAB — POCT GLYCOSYLATED HEMOGLOBIN (HGB A1C): Hemoglobin A1C: 6.8

## 2010-07-26 MED ORDER — HYDROXYZINE HCL 25 MG PO TABS: 25.0000 mg | ORAL_TABLET | Freq: Three times a day (TID) | ORAL | Status: AC | PRN

## 2010-07-26 MED ORDER — CARVEDILOL 25 MG PO TABS: 25.0000 mg | ORAL_TABLET | Freq: Two times a day (BID) | ORAL | Status: DC

## 2010-07-26 NOTE — Progress Notes (Signed)
  Subjective:    Patient ID: [name], female    DOB: [birth year], 40 y.o.   MRN: [id number]  HPI  Life is bad, she has no money, the disability from work has not kicked in.  Son is out of jail, and pitiful. She missed PT apt last week and not rescheduled for 2 weeks, still limping and having problems walking.    Has seen mental health psychiatrist, was given 2 scripts but lost the paper that was 3 weeks ago.  One was for sleep and she was told that she could take it as needed and it may help to sleep, the other she has not idea what it was.  Next apt is next week.  Eating once a day, drinking water all the time, no vomiting, does have nausea.  Review of Systems     Objective:   Physical Exam        Assessment & Plan:

## 2010-07-26 NOTE — Patient Instructions (Signed)
Return in one month We will make apt with renal and GYN for you

## 2010-07-26 NOTE — Assessment & Plan Note (Signed)
Has not seen nephrology since discharge from the hospital in early May.  Referral made, creatinine is down to 3.4, rechecked today.  Still at risk for dehydration secondary to cyclic vomiting that seems to be associated with her mental illness.  IVF given in ER gave her a boost.

## 2010-07-26 NOTE — Assessment & Plan Note (Signed)
A1C at 6.3%, continue Amaryl 2 mg daily

## 2010-07-26 NOTE — Assessment & Plan Note (Signed)
Severe, did not start meds per mental health.  Crying most of the visit.  Really needs counseling, does not have money to cover co-pay.  Instructed her to ask mental health if they could provide.

## 2010-07-26 NOTE — Assessment & Plan Note (Signed)
BP way up today, will ask renal to help with this.

## 2010-07-26 NOTE — Assessment & Plan Note (Signed)
Has not been vomiting so much but remains nauseated.  Prescribed hydroxyzine for sleep and to use for nausea.  Check with pharmacy regarding safety in renal disease.

## 2010-07-26 NOTE — Assessment & Plan Note (Signed)
She reports bleeding quite a bit, ablation 2 years ago, refer back to GYN for evaluation.  Already anemic secondary to renal disease, was on EPO but has not received since last dialysis before discharge from hosptial.

## 2010-07-27 LAB — COMPREHENSIVE METABOLIC PANEL
ALT: 13 U/L (ref 0–35)
Albumin: 4.1 g/dL (ref 3.5–5.2)
CO2: 18 mEq/L — ABNORMAL LOW (ref 19–32)
Calcium: 9.2 mg/dL (ref 8.4–10.5)
Chloride: 107 mEq/L (ref 96–112)
Glucose, Bld: 120 mg/dL — ABNORMAL HIGH (ref 70–99)
Potassium: 4.6 mEq/L (ref 3.5–5.3)
Sodium: 140 mEq/L (ref 135–145)
Total Bilirubin: 0.3 mg/dL (ref 0.3–1.2)
Total Protein: 7.5 g/dL (ref 6.0–8.3)

## 2010-07-27 LAB — CBC
Platelets: 421 10*3/uL — ABNORMAL HIGH (ref 150–400)
RBC: 3.81 MIL/uL — ABNORMAL LOW (ref 3.87–5.11)
RDW: 22.8 % — ABNORMAL HIGH (ref 11.5–15.5)
WBC: 6.3 10*3/uL (ref 4.0–10.5)

## 2010-07-29 ENCOUNTER — Encounter: Payer: Self-pay | Admitting: Family Medicine

## 2010-07-30 ENCOUNTER — Telehealth: Payer: Self-pay | Admitting: *Deleted

## 2010-07-30 NOTE — Telephone Encounter (Signed)
Called patient to ask which GYN she preferred to see, says she want's to go to [facility] hospital. Will fax referral and notes.Busick, Kevin Fenton

## 2010-07-31 ENCOUNTER — Ambulatory Visit: Payer: BC Managed Care – PPO | Admitting: Physical Therapy

## 2010-08-01 ENCOUNTER — Encounter: Payer: Self-pay | Admitting: Family Medicine

## 2010-08-08 ENCOUNTER — Telehealth: Payer: Self-pay | Admitting: Family Medicine

## 2010-08-08 NOTE — Telephone Encounter (Signed)
Pt says she dropped off paperwork to MD on 5/18 when she was here, was not suppose to be filled out until 6/1, pt calling to remind MD to fill out & pt will pick up, call when ready.

## 2010-08-08 NOTE — Telephone Encounter (Signed)
Will fwd. To [facility] [facility]

## 2010-08-10 NOTE — Consult Note (Signed)
[name], [name]               ACCOUNT NO.:  [id number]  MEDICAL RECORD NO.:  [id number]           PATIENT TYPE:  I  LOCATION:  6733                         FACILITY:  [facility]  PHYSICIAN:  Weber Cooks, M.D.     DATE OF BIRTH:  [birth year]  DATE OF CONSULTATION: DATE OF DISCHARGE:                                CONSULTATION   CHIEF COMPLAINT:  Right lower extremity edema, rule out compartment syndrome.  HISTORY OF PRESENT ILLNESS:  Ms. Barreras is a 40 year old female with right lower extremity edema who I consulted to rule out right thigh compartment syndrome.  The patient reported suicide attempt by overdose on doxepin and metformin.  The patient was found by her brother lying on floor.  Reportedly the patient had been lying on the floor for at least 4 hours.  The patient is now complaining of numbness and tingling in the right leg.  No back pain.  States not placed weight on the right leg since the incident.  However, PT does state that the patient has been placing full weight on the right leg with use of a walker and ambulating in hallway.  PAST MEDICAL HISTORY:  Hypothyroidism, diabetes, obesity, anemia, depression, anxiety, obsessive compulsive disorder, hypertension, allergic rhinitis, gallstones, endometrial hyperplasia, nausea, vomiting, abdominal pain.  PAST SURGICAL HISTORY:  None.  SOCIAL HISTORY:  No alcohol or tobacco use.  Lives with son, age 42.  FAMILY HISTORY:  Mother has hypertension; father, hypertension; sibling with MS.  ALLERGIES:  LATEX GLOVES.  MEDICATIONS:  Please see chart.  REVIEW OF SYSTEMS:  Positive for diabetes, hypothyroidism, hypertension, new onset of numbness, medial and lateral right lower extremity, right thigh edema.  PHYSICAL EXAMINATION:  VITAL SIGNS:  Temperature is 98.7, pulse 88, respiratory rate 20, blood pressure 139/85, O2 saturation 98% on room air. GENERAL:  The patient is well-developed, well-nourished obese female  in no acute distress. HEENT:  Head atraumatic, normocephalic.  Extraocular movements are intact. PSYCH:  The patient is alert and oriented x3. EXTREMITIES:  Right leg edema of the right thigh.  Compartments are soft.  She has tenderness from the right thigh down to the plantar aspect of the right foot.  Left leg nontender.  No gross deformities. BACK:  She has no tenderness with palpation of the lumbar spine. NEUROLOGIC:  Decreased sensation in medial and lateral right leg down to the right foot, posterior tibial in several distribution.  She has normal sensation over superficial peroneal nerve region and the deep peroneal nerve region.  Left leg is neurovascularly intact.  Positive EHL, FHL bilaterally.  Positive dorsiflexion and plantar flexion bilaterally, 5/5 strength in dorsiflexion, plantar flexion bilaterally, 5/5 strength with extension flexion through the knee against resistance on the left.  On the right, she has 5/5 with extension and 4+/5 with flexion against resistance through the knee. SKIN:  No rashes.  No ulcerations.  She has well-healed keloid, left anterior proximal tib-fib.  Vascular and dorsal pedal pulse 2+ bilaterally.  LABORATORY DATA:  CBC dated June 10, 2010, white count 9600, hemoglobin 7.7, hematocrit 25.0, platelets 434,000.  White count was 13,200 on admission.  CMET:  Sodium 137, potassium 3.4, chloride 89, bicarb 27, BUN 45, creatinine 9.86, and glucose 162.  Creatine kinase 20,093 from greater than 50,000 on admission.  CK-MB admission was 152.7.  RADIOGRAPHY: 1. Right femur negative for fracture dislocation.  Radiographs of     pelvis and bilateral hips negative for fracture dislocation. 2. Bilateral lower extremity Doppler negative for DVT or Baker's cyst.  ASSESSMENT/PLAN: 1. The patient is a 40 year old female status post overdose/suicide     attempt with rhabdomyolysis, acute renal failure secondary to     rhabdo. 2. Right lower extremity  edema and pain with radicular symptoms, edema     probably secondary to rhabdo, questionable herniated nucleus     pulposus as the source for radicular symptoms or MRI to rule this     out.  Also gave radiographs of her right tib-fib, ankle, and foot     to rule out fracture. 3. No signs of compartment syndrome clinically 4. Continue to work with PT, active range of motion of the right lower     extremity only.  No passive range of motion to prevent heterotopic     bone formation.  Thanks for consult.  We will follow the patient with you.     Erskine Emery, P.A.   ______________________________ Weber Cooks, M.D.    GC/MEDQ  D:  06/10/2010  T:  06/11/2010  Job:  [id number]  cc:   Weber Cooks, M.D.  Electronically Signed by Erskine Emery P.A. on 08/02/2010 10:04:59 AM Electronically Signed by Weber Cooks M.D. on 08/10/2010 09:52:27 AM

## 2010-08-12 NOTE — Telephone Encounter (Signed)
Given to patient  

## 2010-08-12 NOTE — Telephone Encounter (Signed)
Paper work filled out, signed by Dr. Walker Kehr and placed up front from patient to pick up.

## 2010-08-15 NOTE — H&P (Signed)
[name], [name]               ACCOUNT NO.:  [id number]  MEDICAL RECORD NO.:  [id number]           PATIENT TYPE:  I  LOCATION:  0501                          FACILITY:  [facility]  PHYSICIAN:  [name], P.A.-C.DATE OF BIRTH:  May 02, 1970  DATE OF ADMISSION:  07/06/2010 DATE OF DISCHARGE:                      PSYCHIATRIC ADMISSION ASSESSMENT   This is a voluntary admission to the services of Dr. Louie Casa Myka [name].  HISTORY OF PRESENT ILLNESS:  This is a 40 year old, married [place] American female.  She became overwhelmed with her situation.  She is currently separated from her husband.  She had been overly stressed; a variety of financial issues had come up.  She stated there were unexpected bills; her Lucianne Lei broke down; the vacuum cleaner broke, and she just felt it was too much, and she would rather kill herself.  She overdosed on her medications; she took 50 tablets of doxepin, metformin some tablets.  She wanted to go to sleep and not wake up.  Her brother found her of the floor, and the patient remembers her brother talking to her.  He called EMS, and she ended up being admitted for a whole month. She was admitted 3/29.  She was just discharged 4/27, yesterday.  She suffered acute renal failure and acute liver failure.  She had her diabetes and her hypothyroidism adjusted.  She does have iron-deficiency anemia.  She has a history for obsessive-compulsive disorder, depression and anxiety, hypertension, endometrial hyperplasia and menorrhagia.  PAST PSYCHIATRIC HISTORY:  She has never had any inpatient care.  She has had medication prescribed to her by her primary.  SOCIAL HISTORY:  She is separated.  Her 41 year old was in jail at the time she overdosed.  He had stolen something from [place].  He has an upcoming court date 5/2.  Her 48 year old son is in the home, and her sister and brother-in-law have been caring for her sons.  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  FAMILY HISTORY:  Negative.  ALCOHOL AND DRUG HISTORY:  Completely negative.  PRIMARY CARE PROVIDER:  Tereasa Coop, nurse practitioner, with [facility].  MEDICAL PROBLEMS:  Already listed.  MEDICATIONS:  At the time she was discharged from the hospital, she was on: 1. Tylenol 650 mg p.o. q.6 hours as needed. 2. Tums 200 mg 2 tablets p.o. t.i.d. with meals. 3. Coreg 25 mg p.o. b.i.d. with meals. 4. Ferrous sulfate 325 mg 1 tablet p.o. daily. 5. Amaryl 1 tablet p.o. daily with breakfast. 6. Zofran 8 mg p.o. q.6 hours as needed for     nausea. 7. MiraLax 17 grams p.o. daily as needed for constipation. 8. Tramadol 50 mg p.o. q.8 hours as need for pain and headache.  She was also continued on her: 1. Ativan 1 mg p.o. b.i.d. 2. Synthroid 200 mcg 1 tablet p.o. daily. 3. Albuterol 2 puffs inhaled q.4 hours. 4. Prozac 60 mg p.o. daily.  DRUG ALLERGIES:  She has no known drug allergies.  POSITIVE PHYSICAL FINDINGS:  Her  physical exam is a well documented and in the chart.  MENTAL STATUS EXAM:  She is an alert and oriented [place] American female appearing her stated age in no acute distress at this time.  She was appropriately groomed and dressed.  She had moderately good eye contact.  Her motor was normal.  Her speech had a normal rate, rhythm and tone.  Her mood - She is no longer overwhelmed or as depressed as she was at the time of the overdose a month ago.  She convincingly and emphatically denies being suicidal at this time.  She denies any homicidal ideation or auditory or visual hallucinations.  Her thought processes were clear, rational and goal-oriented.  Judgment and insight are intact.  Concentration and memory are intact.  Intelligence is at least average.  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 Axis II:  Deferred. Axis III:  Acute renal failure, improving; acute liver failure, resolved; right thigh pain, resolving from rhabdomyolysis; history for hypothyroidism; history for diabetes type 2; history for iron-deficiency anemia; hypertension; endometrial hyperplasia and menorrhagia. Axis IV:  Primarily financial stressors with low coping skills. Axis V:  39.  She was transferred here to the [facility] to help hook her up with psychiatric care postdischarge.  She is anticipating a short stay of 72 hours or less, and we hope to discharge her no later than Tuesday once followup arrangements can be verified.     [name], P.A.-C.     MD/MEDQ  D:  07/06/2010  T:  07/06/2010  Job:  [id number]  Electronically Signed by [name] P.A.-C. on 07/13/2010 12:46:29 PM Electronically Signed by Samantha Jaeger MD on 08/15/2010 09:29:14 PM

## 2010-08-21 ENCOUNTER — Other Ambulatory Visit: Payer: Self-pay | Admitting: Family Medicine

## 2010-08-21 DIAGNOSIS — F339 Major depressive disorder, recurrent, unspecified: Secondary | ICD-10-CM

## 2010-08-21 NOTE — Assessment & Plan Note (Signed)
Will send letter to [facility] (mental health group) that I agree with them prescribing all of her psych medications including lorazepam.

## 2010-08-26 ENCOUNTER — Telehealth: Payer: Self-pay | Admitting: Family Medicine

## 2010-08-26 NOTE — Telephone Encounter (Signed)
States the hydroxyzine is not working and needs something else called in to help her sleep. [employer] [place]

## 2010-08-26 NOTE — Telephone Encounter (Signed)
Tried to call no answer.  She is seeing psychiatry after suicide attempt, and should call them.  Her situation is too tenuous to tackle over the phone. I also feel this is the job of her psychiatrist to prescribe.  She is on lorazepam this is helpful in sleep.  I worry that she may becoming hypomanic.

## 2010-08-26 NOTE — Telephone Encounter (Signed)
Will fwd. To S.Saxon for advise. Pt may need OV? Javier Glazier, Gerrit Heck

## 2010-08-27 ENCOUNTER — Ambulatory Visit: Payer: BC Managed Care – PPO | Admitting: Family Medicine

## 2010-08-28 ENCOUNTER — Encounter: Payer: Self-pay | Admitting: Family Medicine

## 2010-08-28 DIAGNOSIS — R809 Proteinuria, unspecified: Secondary | ICD-10-CM | POA: Insufficient documentation

## 2010-09-14 ENCOUNTER — Other Ambulatory Visit: Payer: Self-pay | Admitting: Family Medicine

## 2010-09-15 NOTE — Telephone Encounter (Signed)
Refill request

## 2010-09-16 ENCOUNTER — Ambulatory Visit (INDEPENDENT_AMBULATORY_CARE_PROVIDER_SITE_OTHER): Payer: BC Managed Care – PPO | Admitting: Family Medicine

## 2010-09-16 ENCOUNTER — Encounter: Payer: Self-pay | Admitting: Family Medicine

## 2010-09-16 VITALS — BP 146/81 | HR 80 | Wt 357.6 lb

## 2010-09-16 DIAGNOSIS — F339 Major depressive disorder, recurrent, unspecified: Secondary | ICD-10-CM

## 2010-09-16 NOTE — Assessment & Plan Note (Signed)
Very involved in therapy and appears to know her meds.  Recently started on haldol at night and this is helping with obsessive thoughts

## 2010-09-16 NOTE — Patient Instructions (Addendum)
Continue the good work Return in one month for PAP and form completion

## 2010-09-16 NOTE — Progress Notes (Signed)
  Subjective:    Patient ID: [name], female    DOB: 09/13/1970, 40 y.o.   MRN: [id number]  HPI Mood is up and down.  Still has suicidal thoughts but no plan.  Requested Doxepin refill and denied as this was the medication that she overdosed on in March.  She has used for years to control her migraines.  She is seeing the [facility] for medication management and in both individual and group therapy.  She still has problems with her 41 year old and she has asked him to leave the house, her husband is in jail for not providing child support, he gets out this week.  Husband is unemployed but gets food stamps and sells them to by his alcohol. He lives with another woman in [place].    She is ready to return to work as she sees this as one of the only sane places in her life.  She needed the short term disability form completed.   Review of Systems  Constitutional: Negative for activity change.  Neurological: Positive for headaches.  Psychiatric/Behavioral: Positive for dysphoric mood. Negative for agitation.       Objective:   Physical Exam  Constitutional:       Well groomed, alert.  Cried a few times.  Thoughts clear and speech was non pressured.   Psychiatric: She has a normal mood and affect. Her behavior is normal. Judgment and thought content normal.          Assessment & Plan:

## 2010-09-24 ENCOUNTER — Telehealth: Payer: Self-pay | Admitting: Family Medicine

## 2010-09-24 NOTE — Telephone Encounter (Signed)
Will be leaving town at 3:00 and is concerned that her Blood Sugars might be high because she is urinating a lot.  She wants to know if there is anything she can do since she won't be able to get in until her scheduled appt on 8/3.

## 2010-09-24 NOTE — Telephone Encounter (Signed)
Spoke with patient . States symptoms developed 2-3 days ago. Having urinary frequency some  discomfort with urination. Advised patient she needs to be checked for bladder infection and also need to check Blood Sugar . She has no meter , no way to check BS.  States she is on her way out of the door now for a trip to the [place] with friends. Advised that she really needs to go to an Urgent Care for evaluation.  She voices understanding .

## 2010-09-27 ENCOUNTER — Telehealth: Payer: Self-pay | Admitting: *Deleted

## 2010-09-27 ENCOUNTER — Telehealth: Payer: Self-pay | Admitting: Family Medicine

## 2010-09-27 ENCOUNTER — Encounter: Payer: Self-pay | Admitting: Family Medicine

## 2010-09-27 ENCOUNTER — Ambulatory Visit (INDEPENDENT_AMBULATORY_CARE_PROVIDER_SITE_OTHER): Payer: BC Managed Care – PPO | Admitting: Family Medicine

## 2010-09-27 VITALS — BP 141/85 | HR 92 | Temp 98.8°F | Ht 70.0 in | Wt 360.2 lb

## 2010-09-27 DIAGNOSIS — N760 Acute vaginitis: Secondary | ICD-10-CM

## 2010-09-27 DIAGNOSIS — A599 Trichomoniasis, unspecified: Secondary | ICD-10-CM | POA: Insufficient documentation

## 2010-09-27 DIAGNOSIS — N898 Other specified noninflammatory disorders of vagina: Secondary | ICD-10-CM | POA: Insufficient documentation

## 2010-09-27 DIAGNOSIS — Z20828 Contact with and (suspected) exposure to other viral communicable diseases: Secondary | ICD-10-CM

## 2010-09-27 DIAGNOSIS — R300 Dysuria: Secondary | ICD-10-CM

## 2010-09-27 DIAGNOSIS — Z202 Contact with and (suspected) exposure to infections with a predominantly sexual mode of transmission: Secondary | ICD-10-CM

## 2010-09-27 LAB — POCT URINALYSIS DIPSTICK
Bilirubin, UA: NEGATIVE
Blood, UA: NEGATIVE
Ketones, UA: NEGATIVE
pH, UA: 6

## 2010-09-27 LAB — POCT UA - MICROSCOPIC ONLY

## 2010-09-27 LAB — POCT WET PREP (WET MOUNT): Clue Cells Wet Prep HPF POC: NEGATIVE

## 2010-09-27 MED ORDER — AZITHROMYCIN 500 MG PO TABS: 1000.0000 mg | ORAL_TABLET | Freq: Every day | ORAL | Status: AC

## 2010-09-27 MED ORDER — METRONIDAZOLE 500 MG PO TABS: 500.0000 mg | ORAL_TABLET | Freq: Two times a day (BID) | ORAL | Status: AC

## 2010-09-27 NOTE — Telephone Encounter (Signed)
Patient called requesting results on wet prep today. Advised that trich  did show up . Dr. Tamala Julian has given her a prescription already for Flagyl. Urinalysis ok

## 2010-09-27 NOTE — Telephone Encounter (Signed)
Patient reports continued vaginal itching , and burning with urination. Appointment scheduled for this AM.

## 2010-09-27 NOTE — Assessment & Plan Note (Signed)
STD see trichamonas

## 2010-09-27 NOTE — Assessment & Plan Note (Signed)
Pt has trich, given flagyl to take and will have pt return for TOC.  Pt also given prescription for azithro incase GC/Chlamydia comes back +.  No sex with the individual until he gets treated as well.

## 2010-09-27 NOTE — Progress Notes (Signed)
  Subjective:    Patient ID: [name], female    DOB: [birth year], 40 y.o.   MRN: [id number]  HPI SUBJECTIVE:  40 y.o. female complains of white, copious, foul, malodorous and thick vaginal discharge for 1-2  week(s). Denies abnormal vaginal bleeding or significant pelvic pain or Fever.+ UTI symptoms with dysuria.  Did sleep with x-husband 2-3 weeks ago unprotected which pt has had before.  Patient's last menstrual period was 09/09/2010.  OBJECTIVE:  She appears well, afebrile. Abdomen: benign, soft, mild tenderness suprpubic, no masses. Pelvic Exam: VULVA: vulvar tenderness mild, vulvar erythema moderate, VAGINA: normal appearing vagina with normal color and discharge, no lesions, vaginal tenderness no masses or lesions, vaginal erythema moderate, vaginal discharge - white, copious and creamy, CERVIX: normal appearing cervix without discharge or lesions, lesions absent, cervical motion tenderness absent, exam limited by body habitus, WET MOUNT done - results: + trich exam chaperoned by Christen Bame. Urine dipstick: positive for leukocytes.  ASSESSMENT:  bacterial vaginosis and trichomonas  PLAN:  GC and chlamydia DNA  probe sent to lab. Treatment: Flagyl 500 BID x 7 days and abstain from coitus during course of treatment ROV prn if symptoms persist or worsen.    Review of Systems     Objective:   Physical Exam        Assessment & Plan:

## 2010-09-27 NOTE — Telephone Encounter (Signed)
[name] is requesting a return call.

## 2010-09-28 LAB — GC/CHLAMYDIA PROBE AMP, GENITAL: Chlamydia, DNA Probe: NEGATIVE

## 2010-09-30 NOTE — Progress Notes (Signed)
Pt informed. Fleeger, Jessica Dawn  

## 2010-10-11 ENCOUNTER — Ambulatory Visit (INDEPENDENT_AMBULATORY_CARE_PROVIDER_SITE_OTHER): Payer: BC Managed Care – PPO | Admitting: Family Medicine

## 2010-10-11 ENCOUNTER — Other Ambulatory Visit (HOSPITAL_COMMUNITY)
Admission: RE | Admit: 2010-10-11 | Discharge: 2010-10-11 | Disposition: A | Discharge status: Home / Self Care | Payer: BC Managed Care – PPO | Source: Ambulatory Visit | Attending: Family Medicine | Admitting: Family Medicine

## 2010-10-11 VITALS — BP 154/88 | HR 79 | Temp 98.8°F | Ht 70.0 in | Wt 358.0 lb

## 2010-10-11 DIAGNOSIS — Z01419 Encounter for gynecological examination (general) (routine) without abnormal findings: Secondary | ICD-10-CM | POA: Insufficient documentation

## 2010-10-11 DIAGNOSIS — E039 Hypothyroidism, unspecified: Secondary | ICD-10-CM

## 2010-10-11 DIAGNOSIS — E1165 Type 2 diabetes mellitus with hyperglycemia: Secondary | ICD-10-CM

## 2010-10-11 DIAGNOSIS — IMO0002 Reserved for concepts with insufficient information to code with codable children: Secondary | ICD-10-CM

## 2010-10-11 DIAGNOSIS — N72 Inflammatory disease of cervix uteri: Secondary | ICD-10-CM

## 2010-10-11 DIAGNOSIS — Z124 Encounter for screening for malignant neoplasm of cervix: Secondary | ICD-10-CM

## 2010-10-11 DIAGNOSIS — D509 Iron deficiency anemia, unspecified: Secondary | ICD-10-CM

## 2010-10-11 LAB — COMPREHENSIVE METABOLIC PANEL
AST: 11 U/L (ref 0–37)
Albumin: 4.2 g/dL (ref 3.5–5.2)
Alkaline Phosphatase: 43 U/L (ref 39–117)
BUN: 27 mg/dL — ABNORMAL HIGH (ref 6–23)
Glucose, Bld: 126 mg/dL — ABNORMAL HIGH (ref 70–99)
Potassium: 4.7 mEq/L (ref 3.5–5.3)
Sodium: 137 mEq/L (ref 135–145)
Total Bilirubin: 0.2 mg/dL — ABNORMAL LOW (ref 0.3–1.2)
Total Protein: 7.7 g/dL (ref 6.0–8.3)

## 2010-10-11 LAB — CBC
HCT: 32.2 % — ABNORMAL LOW (ref 36.0–46.0)
MCHC: 31.7 g/dL (ref 30.0–36.0)
Platelets: 477 10*3/uL — ABNORMAL HIGH (ref 150–400)
RDW: 13.9 % (ref 11.5–15.5)
WBC: 5.9 10*3/uL (ref 4.0–10.5)

## 2010-10-11 LAB — LIPID PANEL
HDL: 48 mg/dL (ref >39)
LDL Cholesterol: 121 mg/dL — ABNORMAL HIGH (ref 0–99)
Triglycerides: 235 mg/dL — ABNORMAL HIGH (ref <150)
VLDL: 47 mg/dL — ABNORMAL HIGH (ref 0–40)

## 2010-10-11 LAB — TSH: TSH: 4.149 u[IU]/mL (ref 0.350–4.500)

## 2010-10-11 MED ORDER — CARVEDILOL 25 MG PO TABS: 25.0000 mg | ORAL_TABLET | Freq: Two times a day (BID) | ORAL | Status: DC

## 2010-10-11 MED ORDER — GLIMEPIRIDE 2 MG PO TABS: 2.0000 mg | ORAL_TABLET | ORAL | Status: DC

## 2010-10-11 MED ORDER — LEVOTHYROXINE SODIUM 200 MCG PO TABS: 200.0000 ug | ORAL_TABLET | Freq: Every day | ORAL | Status: DC

## 2010-10-11 NOTE — Progress Notes (Signed)
  Subjective:    Patient ID: [name], female    DOB: 18-Dec-1970, 40 y.o.   MRN: [id number]  HPI Here for PAP and STD testing.  She had sex with husband who lives with another woman and contracted trich, she says that her symptoms improved. She would like to date someone, she feels alone.   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    LMP 7 days ago.   Review of Systems  Constitutional: Negative for fatigue.  Cardiovascular: Negative for leg swelling.  Gastrointestinal: Positive for nausea and vomiting.  Genitourinary: Negative for vaginal discharge.  Musculoskeletal: Negative for back pain and arthralgias.  Psychiatric/Behavioral: Positive for sleep disturbance and dysphoric mood. Negative for suicidal ideas.       Objective:   Physical Exam  Constitutional:       Alert, in good spirits, morbidly obese  HENT:  Right Ear: External ear normal.  Left Ear: External ear normal.  Cardiovascular: Normal rate, regular rhythm and normal heart sounds.   Pulmonary/Chest: Effort normal and breath sounds normal.  Abdominal: Soft. Bowel sounds are normal.  Genitourinary: Vagina normal and uterus normal. No vaginal discharge found.  Psychiatric: Her behavior is normal.          Assessment & Plan:

## 2010-10-11 NOTE — Assessment & Plan Note (Signed)
Check lipids and CMET. 

## 2010-10-11 NOTE — Assessment & Plan Note (Signed)
BP not at goal but improved.

## 2010-10-12 LAB — GC/CHLAMYDIA PROBE AMP, GENITAL: Chlamydia, DNA Probe: NEGATIVE

## 2010-10-15 ENCOUNTER — Telehealth: Payer: Self-pay | Admitting: *Deleted

## 2010-10-15 NOTE — Telephone Encounter (Signed)
Waiting for pt to call back. Pt needs appt with kidney doctor per S.Saxon. Will try to have pt go to her kidney doctor. Javier Glazier, Gerrit Heck

## 2010-10-16 NOTE — Telephone Encounter (Signed)
Pt called back. I informed pt of lab results. Pt will call her kidney doctor coldonato and i faxed the lab results to his attention. Javier Glazier, Gerrit Heck

## 2010-10-17 ENCOUNTER — Telehealth: Payer: Self-pay | Admitting: Family Medicine

## 2010-10-17 NOTE — Telephone Encounter (Signed)
Pt states that the kidney specialist have told her that they haven't seen any labs or anything yet.  Needs to know something today.

## 2010-10-17 NOTE — Telephone Encounter (Signed)
Called pt and left message 'faxed labs and request for appt with dr.coladonato. Javier Glazier, Gerrit Heck

## 2010-11-06 ENCOUNTER — Encounter: Payer: Self-pay | Admitting: Family Medicine

## 2010-11-06 DIAGNOSIS — N189 Chronic kidney disease, unspecified: Secondary | ICD-10-CM | POA: Insufficient documentation

## 2010-11-06 DIAGNOSIS — N1832 Chronic kidney disease, stage 3b: Secondary | ICD-10-CM | POA: Insufficient documentation

## 2010-11-06 DIAGNOSIS — N183 Chronic kidney disease, stage 3 (moderate): Secondary | ICD-10-CM

## 2010-12-09 LAB — LIPID PANEL
Cholesterol: 163
HDL: 44

## 2010-12-09 LAB — CROSSMATCH
ABO/RH(D): A POS
Antibody Screen: NEGATIVE

## 2010-12-09 LAB — CBC
HCT: 29.2 — ABNORMAL LOW
Hemoglobin: 9.1 — ABNORMAL LOW
MCHC: 29.4 — ABNORMAL LOW
MCHC: 31.4
MCV: 63.8 — ABNORMAL LOW
MCV: 68.3 — ABNORMAL LOW
MCV: 69.2 — ABNORMAL LOW
Platelets: 512 — ABNORMAL HIGH
Platelets: 530 — ABNORMAL HIGH
RBC: 4.22
RDW: 21.5 — ABNORMAL HIGH
WBC: 9.8

## 2010-12-09 LAB — BASIC METABOLIC PANEL
BUN: 7
Chloride: 106
Creatinine, Ser: 0.87
GFR calc non Af Amer: >60
Glucose, Bld: 138 — ABNORMAL HIGH

## 2010-12-09 LAB — COMPREHENSIVE METABOLIC PANEL
ALT: 15
AST: 27
Alkaline Phosphatase: 43
CO2: 30
Chloride: 104
GFR calc Af Amer: >60
GFR calc non Af Amer: >60
Sodium: 138
Total Bilirubin: 0.4

## 2010-12-09 LAB — APTT: aPTT: 30

## 2010-12-09 LAB — BLEEDING TIME: Bleeding Time: 6

## 2010-12-09 LAB — RETICULOCYTES
RBC.: 3.34 — ABNORMAL LOW
Retic Count, Absolute: 46.8

## 2010-12-09 LAB — PROTIME-INR: INR: 1

## 2010-12-17 LAB — BASIC METABOLIC PANEL
CO2: 25
Calcium: 8.7
Chloride: 104
Creatinine, Ser: 0.83
GFR calc Af Amer: >60
Sodium: 135

## 2010-12-17 LAB — CBC
MCHC: 28.9 — ABNORMAL LOW
Platelets: 613 — ABNORMAL HIGH
RDW: 20.4 — ABNORMAL HIGH

## 2010-12-17 LAB — CROSSMATCH: ABO/RH(D): A POS

## 2010-12-17 LAB — TSH: TSH: 9.894 — ABNORMAL HIGH

## 2010-12-17 LAB — DIFFERENTIAL
Basophils Absolute: 0.1
Eosinophils Absolute: 0.2
Lymphocytes Relative: 33
Monocytes Relative: 7

## 2010-12-17 LAB — IRON AND TIBC
Iron: 15 — ABNORMAL LOW
TIBC: 436

## 2010-12-17 LAB — RETICULOCYTES
Retic Count, Absolute: 54.6
Retic Ct Pct: 1.4

## 2010-12-17 LAB — ABO/RH: ABO/RH(D): A POS

## 2010-12-17 LAB — VITAMIN B12: Vitamin B-12: 330 (ref 211–911)

## 2010-12-17 LAB — HEMOGLOBIN AND HEMATOCRIT, BLOOD
Hemoglobin: 7.5 — CL
Hemoglobin: 8.7 — ABNORMAL LOW

## 2010-12-17 LAB — FOLATE: Folate: >20

## 2010-12-19 ENCOUNTER — Emergency Department (HOSPITAL_COMMUNITY): Payer: Worker's Compensation

## 2010-12-19 ENCOUNTER — Emergency Department (HOSPITAL_COMMUNITY)
Admission: EM | Admit: 2010-12-19 | Discharge: 2010-12-19 | Disposition: A | Discharge status: Home / Self Care | Payer: Worker's Compensation | Attending: Emergency Medicine | Admitting: Emergency Medicine

## 2010-12-19 DIAGNOSIS — Z79899 Other long term (current) drug therapy: Secondary | ICD-10-CM | POA: Insufficient documentation

## 2010-12-19 DIAGNOSIS — E119 Type 2 diabetes mellitus without complications: Secondary | ICD-10-CM | POA: Insufficient documentation

## 2010-12-19 DIAGNOSIS — S60229A Contusion of unspecified hand, initial encounter: Secondary | ICD-10-CM | POA: Insufficient documentation

## 2010-12-19 DIAGNOSIS — I10 Essential (primary) hypertension: Secondary | ICD-10-CM | POA: Insufficient documentation

## 2010-12-19 DIAGNOSIS — E669 Obesity, unspecified: Secondary | ICD-10-CM | POA: Insufficient documentation

## 2010-12-19 DIAGNOSIS — F3289 Other specified depressive episodes: Secondary | ICD-10-CM | POA: Insufficient documentation

## 2010-12-19 DIAGNOSIS — Y990 Civilian activity done for income or pay: Secondary | ICD-10-CM | POA: Insufficient documentation

## 2010-12-19 DIAGNOSIS — E039 Hypothyroidism, unspecified: Secondary | ICD-10-CM | POA: Insufficient documentation

## 2010-12-19 DIAGNOSIS — F329 Major depressive disorder, single episode, unspecified: Secondary | ICD-10-CM | POA: Insufficient documentation

## 2010-12-19 DIAGNOSIS — M7989 Other specified soft tissue disorders: Secondary | ICD-10-CM | POA: Insufficient documentation

## 2010-12-19 DIAGNOSIS — M79609 Pain in unspecified limb: Secondary | ICD-10-CM | POA: Insufficient documentation

## 2011-01-09 ENCOUNTER — Encounter (HOSPITAL_COMMUNITY): Payer: Self-pay

## 2011-03-05 ENCOUNTER — Telehealth: Payer: Self-pay | Admitting: Family Medicine

## 2011-03-05 NOTE — Telephone Encounter (Signed)
Attempted to call multiple times on multiple lines to reschedule appt. Pleas contact pt to reschedule for 12/27 visit. Thank you.

## 2011-03-06 ENCOUNTER — Ambulatory Visit: Payer: Self-pay | Admitting: Family Medicine

## 2011-03-13 ENCOUNTER — Ambulatory Visit (INDEPENDENT_AMBULATORY_CARE_PROVIDER_SITE_OTHER): Payer: BC Managed Care – PPO | Admitting: Family Medicine

## 2011-03-13 ENCOUNTER — Encounter: Payer: Self-pay | Admitting: Family Medicine

## 2011-03-13 VITALS — BP 174/95 | HR 89 | Temp 99.1°F | Ht 70.0 in | Wt 384.0 lb

## 2011-03-13 DIAGNOSIS — E118 Type 2 diabetes mellitus with unspecified complications: Secondary | ICD-10-CM

## 2011-03-13 DIAGNOSIS — I10 Essential (primary) hypertension: Secondary | ICD-10-CM

## 2011-03-13 DIAGNOSIS — F339 Major depressive disorder, recurrent, unspecified: Secondary | ICD-10-CM

## 2011-03-13 DIAGNOSIS — M25559 Pain in unspecified hip: Secondary | ICD-10-CM

## 2011-03-13 DIAGNOSIS — M25551 Pain in right hip: Secondary | ICD-10-CM | POA: Insufficient documentation

## 2011-03-13 DIAGNOSIS — E1165 Type 2 diabetes mellitus with hyperglycemia: Secondary | ICD-10-CM

## 2011-03-13 LAB — CBC
HCT: 30.4 % — ABNORMAL LOW (ref 36.0–46.0)
Hemoglobin: 9.4 g/dL — ABNORMAL LOW (ref 12.0–15.0)
MCH: 26.1 pg (ref 26.0–34.0)
MCHC: 30.9 g/dL (ref 30.0–36.0)
MCV: 84.4 fL (ref 78.0–100.0)
RBC: 3.6 MIL/uL — ABNORMAL LOW (ref 3.87–5.11)

## 2011-03-13 LAB — POCT GLYCOSYLATED HEMOGLOBIN (HGB A1C): Hemoglobin A1C: 7.6

## 2011-03-13 MED ORDER — GABAPENTIN 100 MG PO CAPS: 100.0000 mg | ORAL_CAPSULE | Freq: Three times a day (TID) | ORAL | Status: DC

## 2011-03-13 NOTE — Progress Notes (Signed)
S:  Pt presents today with primary complaint of R hip pain.  Pt states that she has had R hip pain since around 05/2010. Pt states that R hip pain stems from intentional drug overdose with doxapin that led to pt developing rhabdomyalysis and secondary HD dependent renal failure x 2 months. Pt states that she became unconscious and had direct traumatic injury to R hip from overdose and has had severe pain in R hip since this point. Initial hip xrays were negative around 06/2010.  Pt states that pain has persisted since 06/2010. Pain is worse when applying pressure to R hip as well as laying on R side.  Pain also present with ambulating. Pain tends to radiate down R leg. Pain does not extend beyond the knee. No paresthesias or sensory deficits. Pt states that she has been using BID aleve for pain with mild improvement in sxs. Pt denies any strenuous activity that may have aggravated sxs. Pt is morbidly obese and acknowledges that her weight may be affecting hip pain.   Pt is currently no longer on dialysis; is being seen by [facility] on a regular basis for management of renal disease.         O:  Current outpatient prescriptions:albuterol (PROAIR HFA) 108 (90 BASE) MCG/ACT inhaler, As directed , Disp: , Rfl: ;  BD ULTRA-FINE LANCETS lancets, 1 box , Disp: , Rfl: ;  Blood Glucose Monitoring Suppl (BLOOD GLUCOSE METER) kit, Use as directed with test strips and lancets for  daily checks , Disp: , Rfl: ;  carvedilol (COREG) 25 MG tablet, Take 1 tablet (25 mg total) by mouth 2 (two) times daily., Disp: 60 tablet, Rfl: 11 ferrous sulfate 325 (65 FE) MG tablet, Take by mouth 2 (two) times daily. , Disp: , Rfl: ;  FLUoxetine (PROZAC) 20 MG capsule, Take 1 capsule (20 mg total) by mouth 3 (three) times daily. 3 tablets by mouth daily, Disp: 90 capsule, Rfl: 3;  glimepiride (AMARYL) 2 MG tablet, Take 1 tablet (2 mg total) by mouth every morning., Disp: 30 tablet, Rfl: 11;  glucose blood (FREESTYLE LITE) test  strip, Use 1 strip three times a day, 1 box , Disp: , Rfl:  haloperidol (HALDOL) 1 MG tablet, Take 1 mg by mouth 2 (two) times daily. Per mental health , Disp: , Rfl: ;  levothyroxine (LEVOXYL) 200 MCG tablet, Take 1 tablet (200 mcg total) by mouth daily., Disp: 30 tablet, Rfl: 6;  LORazepam (ATIVAN) 1 MG tablet, Take 1 mg by mouth 2 (two) times daily as needed. for anxiety, Disp: , Rfl: ;  POLYETHYLENE GLYCOL 3350 PO, 17 GM in water daily for constipation., QS , Disp: , Rfl:  traZODone (DESYREL) 50 MG tablet, , Disp: , Rfl:   Wt Readings from Last 3 Encounters:  03/13/11 384 lb (174.181 kg)  10/11/10 358 lb (162.388 kg)  09/27/10 360 lb 3.2 oz (163.386 kg)   Temp Readings from Last 3 Encounters:  03/13/11 99.1 F (37.3 C) Oral  10/11/10 98.8 F (37.1 C) Oral  09/27/10 98.8 F (37.1 C) Oral   BP Readings from Last 3 Encounters:  03/13/11 174/95  10/11/10 154/88  09/27/10 141/85   Pulse Readings from Last 3 Encounters:  03/13/11 89  10/11/10 79  09/27/10 92   Gen: in bed, NAD MSK: R hip with full ROM, + Pain with internal and external rotation of hip. FABER positive on R, no distal paresthesias, strength 5/5 bilaterally,   A/P:

## 2011-03-13 NOTE — Patient Instructions (Signed)
Hip Pain The hips join the upper legs to the lower pelvis. The bones, cartilage, tendons, and muscles of the hip joint perform a lot of work each day holding your body weight and allowing you to move around. Hip pain is a common symptom. It can range from a minor ache to severe pain on 1 or both hips. Pain may be felt on the inside of the hip joint near the groin, or the outside near the buttocks and upper thigh. There may be swelling or stiffness as well. It occurs more often when a person walks or performs activity. There are many reasons hip pain can develop. CAUSES  It is important to work with your caregiver to identify the cause since many conditions can impact the bones, cartilage, muscles, and tendons of the hips. Causes for hip pain include:  Broken (fractured) bones.   Separation of the thighbone from the hip socket (dislocation).   Torn cartilage of the hip joint.   Swelling (inflammation) of a tendon (tendonitis), the sac within the hip joint (bursitis), or a joint.   A weakening in the abdominal wall (hernia), affecting the nerves to the hip.   Arthritis in the hip joint or lining of the hip joint.   Pinched nerves in the back, hip, or upper thigh.   A bulging disc in the spine (herniated disc).   Rarely, bone infection or cancer.  DIAGNOSIS  The location of your hip pain will help your caregiver understand what may be causing the pain. A diagnosis is based on your medical history, your symptoms, results from your physical exam, and results from diagnostic tests. Diagnostic tests may include X-ray exams, a computerized magnetic scan (magnetic resonance imaging, MRI), or bone scan. TREATMENT  Treatment will depend on the cause of your hip pain. Treatment may include:  Limiting activities and resting until symptoms improve.   Crutches or other walking supports (a cane or brace).   Ice, elevation, and compression.   Physical therapy or home exercises.   Shoe inserts or  special shoes.   Losing weight.   Medications to reduce pain.   Undergoing surgery.  HOME CARE INSTRUCTIONS   Only take over-the-counter or prescription medicines for pain, discomfort, or fever as directed by your caregiver.   Put ice on the injured area:   Put ice in a plastic bag.   Place a towel between your skin and the bag.   Leave the ice on for 15 to 20 minutes at a time, 3 to 4 times a day.   Keep your leg raised (elevated) when possible to lessen swelling.   Avoid activities that cause pain.   Follow specific exercises as directed by your caregiver.   Sleep with a pillow between your legs on your most comfortable side.   Record how often you have hip pain, the location of the pain, and what it feels like. This information may be helpful to you and your caregiver.   Ask your caregiver about returning to work or sports and whether you should drive.   Follow up with your caregiver for further exams, therapy, or testing as directed.  SEEK MEDICAL CARE IF:   Your pain or swelling continues or worsens after 1 week.   You are feeling unwell or have chills.   You have increasing difficulty with walking.   You have a loss of sensation or other new symptoms.   You have questions or concerns.  SEEK IMMEDIATE MEDICAL CARE IF:   You  cannot put weight on the affected hip.   You have fallen.   You have a sudden increase in pain and swelling in your hip.   You have a fever.  MAKE SURE YOU:   Understand these instructions.   Will watch your condition.   Will get help right away if you are not doing well or get worse.  Document Released: 08/14/2009 Document Revised: 11/06/2010 Document Reviewed: 08/14/2009 [facility] Patient Information 2012 [place].

## 2011-03-14 LAB — COMPREHENSIVE METABOLIC PANEL
Alkaline Phosphatase: 52 U/L (ref 39–117)
BUN: 27 mg/dL — ABNORMAL HIGH (ref 6–23)
CO2: 21 mEq/L (ref 19–32)
Creat: 1.62 mg/dL — ABNORMAL HIGH (ref 0.50–1.10)
Glucose, Bld: 153 mg/dL — ABNORMAL HIGH (ref 70–99)
Total Bilirubin: 0.2 mg/dL — ABNORMAL LOW (ref 0.3–1.2)

## 2011-03-14 NOTE — Assessment & Plan Note (Signed)
Checking A1C today. Will formally follow up in 1-2 weeks.

## 2011-03-14 NOTE — Assessment & Plan Note (Addendum)
Relatively broad differential for this including hip OA and AVN (+ pain with internal rotation). No noted trochanteric pain on clinical exam today. Will obtain repeat R hip xray. Will refer to PT for rehab. Will start on neurontin. Stressed importance of weight loss (BMI 55 and noted >25 lb weight gain since last clinical visit 09/2010)  Plan to follow up in 1-2 weeks. Also stressed NSAID avoidance in setting of CKD.

## 2011-03-14 NOTE — Assessment & Plan Note (Signed)
Checking labs. Will make no changes today. BP elevated in setting of chronic NSAID use for hip pain. Stressed avoidance. Will formally readdress in 1-2 weeks.

## 2011-03-17 ENCOUNTER — Telehealth: Payer: Self-pay | Admitting: Family Medicine

## 2011-03-17 NOTE — Telephone Encounter (Signed)
Will fwd. To Dr.Newton to address. Javier Glazier, Gerrit Heck

## 2011-03-17 NOTE — Telephone Encounter (Signed)
Called pt and went over lab results with her. Pt also request for Dr.Newton to call her. I told the pt that I would send a message to Dr.Newton. Javier Glazier, Gerrit Heck

## 2011-03-17 NOTE — Telephone Encounter (Signed)
Pt is worries about her lab numbers and wants to know if someone can give her the results

## 2011-03-17 NOTE — Telephone Encounter (Signed)
Wants to know results of her labs from last week

## 2011-03-20 NOTE — Telephone Encounter (Signed)
Left VM for pt that if she had any particular issues to call back. Otherwise, we will discuss issues at next clinic visit.

## 2011-03-23 ENCOUNTER — Encounter: Payer: Self-pay | Admitting: Family Medicine

## 2011-05-27 ENCOUNTER — Ambulatory Visit (INDEPENDENT_AMBULATORY_CARE_PROVIDER_SITE_OTHER): Payer: BC Managed Care – PPO | Admitting: Family Medicine

## 2011-05-27 ENCOUNTER — Emergency Department (HOSPITAL_COMMUNITY)
Admission: EM | Admit: 2011-05-27 | Discharge: 2011-05-27 | Disposition: A | Discharge status: Home / Self Care | Payer: BC Managed Care – PPO | Attending: Emergency Medicine | Admitting: Emergency Medicine

## 2011-05-27 ENCOUNTER — Encounter: Payer: Self-pay | Admitting: Family Medicine

## 2011-05-27 ENCOUNTER — Encounter (HOSPITAL_COMMUNITY): Payer: Self-pay | Admitting: Emergency Medicine

## 2011-05-27 VITALS — BP 107/81 | HR 105 | Ht 70.0 in | Wt 381.0 lb

## 2011-05-27 DIAGNOSIS — M25559 Pain in unspecified hip: Secondary | ICD-10-CM | POA: Insufficient documentation

## 2011-05-27 DIAGNOSIS — F339 Major depressive disorder, recurrent, unspecified: Secondary | ICD-10-CM

## 2011-05-27 DIAGNOSIS — R45851 Suicidal ideations: Secondary | ICD-10-CM | POA: Insufficient documentation

## 2011-05-27 DIAGNOSIS — M25551 Pain in right hip: Secondary | ICD-10-CM

## 2011-05-27 DIAGNOSIS — F329 Major depressive disorder, single episode, unspecified: Secondary | ICD-10-CM

## 2011-05-27 DIAGNOSIS — Z79899 Other long term (current) drug therapy: Secondary | ICD-10-CM | POA: Insufficient documentation

## 2011-05-27 DIAGNOSIS — F3289 Other specified depressive episodes: Secondary | ICD-10-CM | POA: Insufficient documentation

## 2011-05-27 DIAGNOSIS — E669 Obesity, unspecified: Secondary | ICD-10-CM | POA: Insufficient documentation

## 2011-05-27 DIAGNOSIS — E119 Type 2 diabetes mellitus without complications: Secondary | ICD-10-CM | POA: Insufficient documentation

## 2011-05-27 HISTORY — DX: Major depressive disorder, single episode, unspecified: F32.9

## 2011-05-27 HISTORY — DX: Anxiety disorder, unspecified: F41.9

## 2011-05-27 HISTORY — DX: Depression, unspecified: F32.A

## 2011-05-27 LAB — ETHANOL: Alcohol, Ethyl (B): <11 mg/dL (ref 0–11)

## 2011-05-27 LAB — COMPREHENSIVE METABOLIC PANEL
AST: 14 U/L (ref 0–37)
Albumin: 3.9 g/dL (ref 3.5–5.2)
Alkaline Phosphatase: 56 U/L (ref 39–117)
BUN: 21 mg/dL (ref 6–23)
CO2: 24 mEq/L (ref 19–32)
Chloride: 103 mEq/L (ref 96–112)
Creatinine, Ser: 1.67 mg/dL — ABNORMAL HIGH (ref 0.50–1.10)
GFR calc non Af Amer: 37 mL/min — ABNORMAL LOW (ref >90)
Potassium: 4.5 mEq/L (ref 3.5–5.1)
Total Bilirubin: 0.2 mg/dL — ABNORMAL LOW (ref 0.3–1.2)

## 2011-05-27 LAB — CBC
HCT: 32.9 % — ABNORMAL LOW (ref 36.0–46.0)
MCV: 81.2 fL (ref 78.0–100.0)
RBC: 4.05 MIL/uL (ref 3.87–5.11)
RDW: 14.7 % (ref 11.5–15.5)
WBC: 8.8 10*3/uL (ref 4.0–10.5)

## 2011-05-27 LAB — RAPID URINE DRUG SCREEN, HOSP PERFORMED
Amphetamines: NOT DETECTED
Barbiturates: NOT DETECTED
Benzodiazepines: NOT DETECTED

## 2011-05-27 LAB — ACETAMINOPHEN LEVEL: Acetaminophen (Tylenol), Serum: <15 ug/mL (ref 10–30)

## 2011-05-27 MED ORDER — FLUOXETINE HCL 60 MG PO TABS: 60.0000 mg | ORAL_TABLET | Freq: Every day | ORAL | Status: DC

## 2011-05-27 MED ORDER — LORAZEPAM 1 MG PO TABS: 1.0000 mg | ORAL_TABLET | Freq: Two times a day (BID) | ORAL | Status: DC | PRN

## 2011-05-27 MED ORDER — IBUPROFEN 600 MG PO TABS: 600.0000 mg | ORAL_TABLET | Freq: Three times a day (TID) | ORAL | Status: DC | PRN

## 2011-05-27 MED ORDER — TRAZODONE HCL 50 MG PO TABS: 50.0000 mg | ORAL_TABLET | Freq: Every day | ORAL | Status: DC

## 2011-05-27 MED ORDER — FLUOXETINE HCL 20 MG PO CAPS
70.0000 mg | ORAL_CAPSULE | Freq: Every day | ORAL | Status: DC
  Administered 2011-05-27: 70 mg via ORAL
  Filled 2011-05-27 (×2): qty 1

## 2011-05-27 MED ORDER — LEVOTHYROXINE SODIUM 200 MCG PO TABS
200.0000 ug | ORAL_TABLET | Freq: Every day | ORAL | Status: DC
  Administered 2011-05-27: 200 ug via ORAL
  Filled 2011-05-27 (×3): qty 1

## 2011-05-27 MED ORDER — HALOPERIDOL 1 MG PO TABS
1.0000 mg | ORAL_TABLET | Freq: Two times a day (BID) | ORAL | Status: DC
  Administered 2011-05-27: 1 mg via ORAL
  Filled 2011-05-27: qty 1

## 2011-05-27 MED ORDER — LORAZEPAM 1 MG PO TABS: 1.0000 mg | ORAL_TABLET | Freq: Three times a day (TID) | ORAL | Status: DC | PRN

## 2011-05-27 MED ORDER — CARVEDILOL 25 MG PO TABS
25.0000 mg | ORAL_TABLET | Freq: Two times a day (BID) | ORAL | Status: DC
  Administered 2011-05-27: 25 mg via ORAL
  Filled 2011-05-27 (×5): qty 1

## 2011-05-27 MED ORDER — TRAZODONE 25 MG HALF TABLET: 25.0000 mg | ORAL_TABLET | Freq: Every day | ORAL | Status: DC

## 2011-05-27 MED ORDER — ZOLPIDEM TARTRATE 5 MG PO TABS: 5.0000 mg | ORAL_TABLET | Freq: Every evening | ORAL | Status: DC | PRN

## 2011-05-27 MED ORDER — GLIMEPIRIDE 2 MG PO TABS
2.0000 mg | ORAL_TABLET | Freq: Every day | ORAL | Status: DC
  Filled 2011-05-27: qty 1

## 2011-05-27 MED ORDER — ONDANSETRON HCL 4 MG PO TABS: 4.0000 mg | ORAL_TABLET | Freq: Three times a day (TID) | ORAL | Status: DC | PRN

## 2011-05-27 MED ORDER — GABAPENTIN 100 MG PO CAPS
100.0000 mg | ORAL_CAPSULE | Freq: Three times a day (TID) | ORAL | Status: DC
  Administered 2011-05-27: 100 mg via ORAL
  Filled 2011-05-27 (×5): qty 1

## 2011-05-27 MED ORDER — DIPHENHYDRAMINE HCL 25 MG PO CAPS: 25.0000 mg | ORAL_CAPSULE | Freq: Every evening | ORAL | Status: DC | PRN

## 2011-05-27 MED ORDER — ACETAMINOPHEN 325 MG PO TABS: 650.0000 mg | ORAL_TABLET | ORAL | Status: DC | PRN

## 2011-05-27 MED ORDER — FLUOXETINE HCL 20 MG PO CAPS: 20.0000 mg | ORAL_CAPSULE | Freq: Three times a day (TID) | ORAL | Status: DC

## 2011-05-27 MED ORDER — ALBUTEROL SULFATE HFA 108 (90 BASE) MCG/ACT IN AERS: 2.0000 | INHALATION_SPRAY | RESPIRATORY_TRACT | Status: DC | PRN

## 2011-05-27 MED ORDER — ARIPIPRAZOLE 5 MG PO TABS: 5.0000 mg | ORAL_TABLET | Freq: Every day | ORAL | Status: AC

## 2011-05-27 NOTE — ED Notes (Signed)
Pt discharge instructions reviewed with her, note back to work given, paper Rx given and pt verbalizes understanding. Pt discharged to care of self and escorted off the unit in safe and stable condition.

## 2011-05-27 NOTE — ED Provider Notes (Addendum)
Patient had had a tele-psych consult with Dr. Gwynn Burly who feels patient is safe to be discharged and is no longer a threat to herself. She recommends that she stop her Haldol, decrease her Prozac from 70 mg to 60 mg in the morning, start Abilify 5 mg at bedtime, and decreased her trazodone to 25 mg at bedtime, and she can increase her Ativan to 1 mg 3 times a day as needed. Patient relates she has an appointment with her psychiatrist on April 2. She also has a therapist she can see. 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  Patient is an obese, but she is alert, cooperative. She makes good eye contact, she is smiling and is in no distress. She relates she is feeling much improved.  Diagnoses that have been ruled out:  None  Diagnoses that are still under consideration:  None  Final diagnoses:  Hip pain, right  Depression  Suicidal ideation    New Prescriptions   ARIPIPRAZOLE (ABILIFY) 5 MG TABLET    Take 1 tablet (5 mg total) by mouth at bedtime.   FLUOXETINE 60 MG TABS    Take 60 mg by mouth daily.   LORAZEPAM (ATIVAN) 1 MG TABLET    Take 1 tablet (1 mg total) by mouth 3 (three) times daily as needed for anxiety.   TRAZODONE (DESYREL) 25 MG TABS    Take 0.5 tablets (25 mg total) by mouth at bedtime.     Plan discharge  Rolland Porter, MD, Alanson Aly, MD 05/27/11 [place], MD 05/27/11 [phone]

## 2011-05-27 NOTE — Discharge Instructions (Signed)
Dr. Gwynn Burly, the psychiatrist who spoke to you tonight recommend you stop your Haldol, decrease your trazodone to 25 mg at bedtime, start Abilify 5 mg at bedtime, decrease your Prozac to 60 mg in the morning, and you can increase her Ativan to 1 mg 3 times a day when necessary anxiety. Keep your appointment with her psychiatrist on April 2. Also see your therapist. Recheck if you feel like you may do something to hurt yourself or feel worse.

## 2011-05-27 NOTE — ED Notes (Signed)
States she has been depressed and anxious.  Her 42 year old homeless son has come home to live with her and causing her stress.  Feels suicidal x 1 week with no plan.  Denies HI.

## 2011-05-27 NOTE — Patient Instructions (Signed)
It was nice to meet you today. I'm sorry you're having such a hard time. I want you to drive yourself directly to the [facility] Emergency Department to be evaluated for your suicidal thoughts.  You have agreed that you are able to drive from the clinic to the ED without hurting yourself.  Please call the clinic, [phone], if you feel like you are unable to make it to the ED without hurting yourself. I have let the [facility] know that you are coming.  You should be brought back and evaluated in the ED for hopeful admission to [facility].

## 2011-05-27 NOTE — ED Notes (Signed)
Report given to [facility].  Dr. Eulis Foster notified re pt's elevated platelets.  He okayed pt to be transported to Psych ED.

## 2011-05-27 NOTE — BHH Counselor (Signed)
Per telepsych discharge home to follow up with current provider. Patient has a appointment with both her psychiatrist and therapist in April. Patient given additional referrals for support if needed. Patient has contact information to mobile crises and she has the contact information for [facility] if needed.   **Writer spoke with patient and also suggested Psych IOP. Patient refused stating that she wanted to follow up with her current providers.  Writer made Dr. Daleen Bo knapp aware of patients disposition. Patient is pending discharge home at this time.

## 2011-05-27 NOTE — BH Assessment (Signed)
Assessment Note   [name] is an 41 y.o. female. Pt comes in today for worsening anxiety, depression and suicidal thoughts. Patient is followed by the [facility] mental health and was most recently seen yesterday by a psychiatrist, 05/26/10. No changes were made to her medications. Sts she was tearful at that appointment and was told that she should restart therapy.  Patient tearful throughout the conversation. She explains that she fears therapy because her therapist has left and she feels like she needs to "start all over." Patient has a appointment with a new psychiatrist next month. Sts, "I really don't want to start over. I don't adapt to change very well". Pt told examining ED doctor that she is in a "very dark place" and feels like she "keeps getting lower" like she is in a "dark circle or spiral." + SI but no HI. Does not have a current plan but has history of a suicide attempt by OD 05/2010. The examining doctor also noted that pt does not feel safe going home. Patient states she started this downward spiral since last Thursday. New/recent stressors include her son, who is homeless with bipolar d/o and ADHD not on medications or well controlled, coming back to live with her recently because she "felt so bad for him." She denies any substance abuse. She denies any hallucinations. She does c/o racing thoughts and inability to sleep since Thursday.   Towards the end of the assessment patient sts that she is not suicidal and has no plan. Sts that she feels of to discharge home and would like to simply follow up with her therapist. She has a current appointment with a new therapist at [facility] in April.  Writer initiated a telepsych consult to assist with determining an appropriate disposition.        Axis I: Major Depression, Recurrent severe Axis II: Deferred Axis III:  Past Medical History  Diagnosis Date  . Suicide attempt by drug ingestion 06/05/10    result rhabdomyolosis and ARF  requrining dialysis   . Rhabdomyolysis 06/06/10    after drug overdose  . Acute renal failure 05/27/10    hemodialysis for 6 weeks  . Anxiety   . Diabetes mellitus   . Depression    Axis IV: problems with access to health care services and problems with primary support group Axis V: 21-30 behavior considerably influenced by delusions or hallucinations OR serious impairment in judgment, communication OR inability to function in almost all areas  Past Medical History:  Past Medical History  Diagnosis Date  . Suicide attempt by drug ingestion 06/05/10    result rhabdomyolosis and ARF requrining dialysis   . Rhabdomyolysis 06/06/10    after drug overdose  . Acute renal failure 05/27/10    hemodialysis for 6 weeks  . Anxiety   . Diabetes mellitus   . Depression     Past Surgical History  Procedure Date  . Tubal ligation 1998  . Cholecystectomy     Family History: No family history on file.  Social History:  reports that she has never smoked. She does not have any smokeless tobacco history on file. She reports that she does not drink alcohol or use illicit drugs.  Additional Social History:  Alcohol / Drug Use Pain Medications: see listed meds noted by ED staff Prescriptions: see listed meds noted by ED staff Over the Counter: see listed meds noted by ED staff History of alcohol / drug use?: No history of alcohol / drug abuse  Allergies:  Allergies  Allergen Reactions  . Ace Inhibitors Other (See Comments)    Patient had acute renal failure requiring hemodialysis after a suicide attempt.  Nephro recommended avoiding use.  . Doxepin Other (See Comments)    Overdosed and developed serious ARF from rhabdomyolysis   . Latex   . Metformin And Related Other (See Comments)    Used in her suicide attempt that contributed to severe acute renal failure  . Nsaids Other (See Comments)    Nephro recommended avoiding after acute renal failure requiring hemodialysis associated with a  suicide attempt.    Home Medications:  Medications Prior to Admission  Medication Dose Route Frequency Provider Last Rate Last Dose  . acetaminophen (TYLENOL) tablet 650 mg  650 mg Oral Q4H PRN Richarda Blade, MD      . albuterol (PROVENTIL HFA;VENTOLIN HFA) 108 (90 BASE) MCG/ACT inhaler 2 puff  2 puff Inhalation Q4H PRN Richarda Blade, MD      . carvedilol (COREG) tablet 25 mg  25 mg Oral BID Richarda Blade, MD   25 mg at 05/27/11 1356  . diphenhydrAMINE (BENADRYL) capsule 25 mg  25 mg Oral QHS PRN Richarda Blade, MD      . FLUoxetine (PROZAC) capsule 70 mg  70 mg Oral Daily Richarda Blade, MD      . gabapentin (NEURONTIN) capsule 100 mg  100 mg Oral TID Richarda Blade, MD      . glimepiride (AMARYL) tablet 2 mg  2 mg Oral Q breakfast Richarda Blade, MD      . haloperidol (HALDOL) tablet 1 mg  1 mg Oral BID Richarda Blade, MD   1 mg at 05/27/11 1330  . ibuprofen (ADVIL,MOTRIN) tablet 600 mg  600 mg Oral Q8H PRN Richarda Blade, MD      . levothyroxine (SYNTHROID, LEVOTHROID) tablet 200 mcg  200 mcg Oral Daily Richarda Blade, MD   200 mcg at 05/27/11 1356  . LORazepam (ATIVAN) tablet 1 mg  1 mg Oral BID PRN Richarda Blade, MD      . ondansetron [facility]) tablet 4 mg  4 mg Oral Q8H PRN Richarda Blade, MD      . traZODone (DESYREL) tablet 50 mg  50 mg Oral QHS Richarda Blade, MD      . zolpidem (AMBIEN) tablet 5 mg  5 mg Oral QHS PRN Richarda Blade, MD      . DISCONTD: FLUoxetine (PROZAC) capsule 20 mg  20 mg Oral TID Richarda Blade, MD       Medications Prior to Admission  Medication Sig Dispense Refill  . albuterol (PROAIR HFA) 108 (90 BASE) MCG/ACT inhaler As directed       . BD ULTRA-FINE LANCETS lancets 1 box       . carvedilol (COREG) 25 MG tablet Take 1 tablet (25 mg total) by mouth 2 (two) times daily.  60 tablet  11  . gabapentin (NEURONTIN) 100 MG capsule Take 1 capsule (100 mg total) by mouth 3 (three) times daily.  90 capsule  3  . glimepiride (AMARYL) 2 MG tablet  Take 1 tablet (2 mg total) by mouth every morning.  30 tablet  11  . haloperidol (HALDOL) 1 MG tablet Take 1 mg by mouth 2 (two) times daily. Per mental health       . levothyroxine (LEVOXYL) 200 MCG tablet Take 1 tablet (200 mcg total) by mouth daily.  30 tablet  6  . LORazepam (ATIVAN) 1 MG tablet Take 1 mg by mouth 2 (two) times daily as needed. for anxiety      . POLYETHYLENE GLYCOL 3350 PO 17 GM in water daily for constipation., QS       . traZODone (DESYREL) 50 MG tablet Take 50 mg by mouth at bedtime.       Marland Kitchen DISCONTD: FLUoxetine (PROZAC) 20 MG capsule Take 1 capsule (20 mg total) by mouth 3 (three) times daily. 3 tablets by mouth daily  90 capsule  3  . Blood Glucose Monitoring Suppl (BLOOD GLUCOSE METER) kit Use as directed with test strips and lancets for  daily checks       . glucose blood (FREESTYLE LITE) test strip Use 1 strip three times a day, 1 box         OB/GYN Status:  No LMP recorded.  General Assessment Data Location of Assessment: [facility] ACT Assessment:  (No) Living Arrangements:  (lives alone with 2 sons (68 y/o and 28 y/o)) Can pt return to current living arrangement?: Yes Admission Status: Voluntary Is patient capable of signing voluntary admission?: Yes Transfer from: [facility] (pt came here from her psychiatrist office) Referral Source: Psychiatrist (psychiatrist at [facility])  Education Status Is patient currently in school?: No  Risk to self Suicidal Ideation: Yes-Currently Present Suicidal Intent: No Is patient at risk for suicide?: Yes Suicidal Plan?: No Access to Means: Yes (medications) Specify Access to Suicidal Means:  (medications) What has been your use of drugs/alcohol within the last 12 months?:  (no alcohol or drug use noted) Previous Attempts/Gestures: Yes How many times?:  (1x) Other Self Harm Risks:  (n/a) Triggers for Past Attempts:  (depresssion) Intentional Self Injurious Behavior: None Family Suicide History: Yes (mother-depression;  paternal grandmother-depression) Recent stressful life event(s):  (increased anxiety and associated panic attackts) Persecutory voices/beliefs?: No Depression: Yes Depression Symptoms: Tearfulness;Isolating;Fatigue;Guilt;Loss of interest in usual pleasures;Feeling worthless/self pity;Insomnia;Despondent;Feeling angry/irritable Substance abuse history and/or treatment for substance abuse?: No Suicide prevention information given to non-admitted patients: Not applicable  **Patient denies stressors. However, ED notes written by the examining doctor indicate new/recent stressors include her son, who is homeless with bipolar d/o and ADHD not on medications or well controlled, coming back to live with her recently because she "felt so bad for him."  Risk to Others Homicidal Ideation: No Thoughts of Harm to Others: No Current Homicidal Intent: No Current Homicidal Plan: No Access to Homicidal Means: No Identified Victim:  (n/a) History of harm to others?: No Assessment of Violence: None Noted Violent Behavior Description:  (n/a) Does patient have access to weapons?: No Criminal Charges Pending?: No Does patient have a court date: No  Psychosis Hallucinations: None noted Delusions: None noted  Mental Status Report Appear/Hygiene: Disheveled Eye Contact: Poor Motor Activity: Freedom of movement Speech: Logical/coherent Level of Consciousness: Alert Mood: Depressed;Anxious Affect: Anxious;Depressed Anxiety Level: Panic Attacks Panic attack frequency:  ("4-6 per day since last Thurday") Most recent panic attack:  (today 05/27/2011) Thought Processes: Relevant Judgement: Unimpaired Orientation: Person;Place;Time;Situation Obsessive Compulsive Thoughts/Behaviors: None  Cognitive Functioning Concentration: Decreased Memory: Recent Intact;Remote Intact IQ: Average Insight: Fair Impulse Control: Fair Appetite: Poor Weight Loss:  (0) Weight Gain:  (0) Sleep: Decreased Total Hours  of Sleep:  (no more than 2-3 hours at a time) Vegetative Symptoms: None  Prior Inpatient Therapy Prior Inpatient Therapy: Yes Prior Therapy Dates:  (1 yr ago) Prior Therapy Facilty/Provider(s):  [facility]) Reason for Treatment:  (suicide attempt by overdose)  Prior Outpatient Therapy Prior Outpatient Therapy: Yes Prior Therapy Dates:  (currently a patient; upcoming appt.'s w/ psychiatrist/therap) Prior Therapy Facilty/Provider(s):  [facility]) Reason for Treatment:  (depression and  anxiety; med mgt)  ADL Screening (condition at time of admission) Patient's cognitive ability adequate to safely complete daily activities?: Yes Patient able to express need for assistance with ADLs?: Yes Independently performs ADLs?: Yes Communication: Independent Dressing (OT): Independent Grooming: Independent Feeding: Independent Bathing: Independent Toileting: Independent In/Out Bed: Independent Walks in Home: Independent Weakness of Legs: None Weakness of Arms/Hands: None  Home Assistive Devices/Equipment Home Assistive Devices/Equipment: None    Abuse/Neglect Assessment (Assessment to be complete while patient is alone) Physical Abuse: Denies Verbal Abuse: Denies Sexual Abuse: Denies Exploitation of patient/patient's resources: Denies Self-Neglect: Denies Values / Beliefs Cultural Requests During Hospitalization: None Spiritual Requests During Hospitalization: None   Advance Directives (For Healthcare) Advance Directive: Patient does not have advance directive Nutrition Screen Diet: Regular  Additional Information 1:1 In Past 12 Months?: No CIRT Risk: No Elopement Risk: No Does patient have medical clearance?: Yes     Disposition:  Disposition Disposition of Patient: Other dispositions (Disposition pending a telepsych consult) Other disposition(s):  (Disposition pending a telepsych consult and consult initiate)  On Site Evaluation by:   Reviewed with Physician:     Evangeline Gula 05/27/2011 4:45 PM

## 2011-05-27 NOTE — BHH Counselor (Signed)
Patient assessed and telepsych initiated.

## 2011-05-27 NOTE — Assessment & Plan Note (Signed)
She is able to contract for safety in order to drive herself from clinic to the [facility] for further behavioral health evaluation.  Patient feels she is not safe going home, has active SI without a plan.  Instructed [facility] directly to [facility].  Feels she is able to drive herself.  [facility] called and informed that pt will be coming.  Cell # [phone].  Will call if not in [facility] by 11:30am today. Informed pt to call clinic if SI worsens on her drive and she feels like she is not able to safely make it there.

## 2011-05-27 NOTE — ED Notes (Signed)
Pt states that she has anxiety and si about her son that has moved back home after being homeless and it has been to much stress on her. Alert x4, voluntary

## 2011-05-27 NOTE — Assessment & Plan Note (Signed)
No longer in therapy.  Appears to be in acute crisis.  She A/P under SI.

## 2011-05-27 NOTE — ED Provider Notes (Signed)
History     CSN: [id number]  Arrival date & time 05/27/11  1042   First MD Initiated Contact with Patient 05/27/11 1209      Chief Complaint  Patient presents with  . Medical Clearance    (Consider location/radiation/quality/duration/timing/severity/associated sxs/prior treatment) HPI Comments: [name] is a 41 y.o. Female who is frustrated and upset about losing her therapist and the recent arrival of her son into her home. She is feeling suicidal. She has not taken anything to harm herself. She drove here from her PCP office. She denies recent illness. She has been taking her medication except her Prozac, which she misses at times..  The history is provided by the patient.    Past Medical History  Diagnosis Date  . Suicide attempt by drug ingestion 06/05/10    result rhabdomyolosis and ARF requrining dialysis   . Rhabdomyolysis 06/06/10    after drug overdose  . Acute renal failure 05/27/10    hemodialysis for 6 weeks  . Anxiety   . Diabetes mellitus   . Depression     Past Surgical History  Procedure Date  . Tubal ligation 1998  . Cholecystectomy     No family history on file.  History  Substance Use Topics  . Smoking status: Never Smoker   . Smokeless tobacco: Not on file  . Alcohol Use: No    OB History    Grav Para Term Preterm Abortions TAB SAB Ect Mult Living                  Review of Systems  All other systems reviewed and are negative.    Allergies  Ace inhibitors; Doxepin; Latex; Metformin and related; and Nsaids  Home Medications   Current Outpatient Rx  Name Route Sig Dispense Refill  . ALBUTEROL SULFATE HFA 108 (90 BASE) MCG/ACT IN AERS  As directed     . BD ULTRA-FINE LANCETS MISC  1 box     . CARVEDILOL 25 MG PO TABS Oral Take 1 tablet (25 mg total) by mouth 2 (two) times daily. 60 tablet 11  . DIPHENHYDRAMINE HCL 25 MG PO CAPS Oral Take 25 mg by mouth at bedtime as needed. sleep    . FLUOXETINE HCL 10 MG PO TABS Oral Take 10  mg by mouth daily. PT TAKES WITH 60 MG FOR A TOTAL OF 70 MG    . FLUOXETINE HCL 20 MG PO TABS Oral Take 60 mg by mouth See admin instructions. TAKES 3TABS FOR 60  MG DOSE, ALONG WITH 10 MG FOR A TOTAL DOSE OF 70 MG daily.    Marland Kitchen GABAPENTIN 100 MG PO CAPS Oral Take 1 capsule (100 mg total) by mouth 3 (three) times daily. 90 capsule 3  . GLIMEPIRIDE 2 MG PO TABS Oral Take 1 tablet (2 mg total) by mouth every morning. 30 tablet 11  . HALOPERIDOL 1 MG PO TABS Oral Take 1 mg by mouth 2 (two) times daily. Per mental health     . LEVOTHYROXINE SODIUM 200 MCG PO TABS Oral Take 1 tablet (200 mcg total) by mouth daily. 30 tablet 6  . LORAZEPAM 1 MG PO TABS Oral Take 1 mg by mouth 2 (two) times daily as needed. for anxiety    . POLYETHYLENE GLYCOL 3350 PO  17 GM in water daily for constipation., QS     . TRAZODONE HCL 50 MG PO TABS Oral Take 50 mg by mouth at bedtime.     . ARIPIPRAZOLE  5 MG PO TABS Oral Take 1 tablet (5 mg total) by mouth at bedtime. 14 tablet 0  . BLOOD GLUCOSE METER KIT  Use as directed with test strips and lancets for  daily checks     . FLUOXETINE HCL 60 MG PO TABS Oral Take 60 mg by mouth daily. 14 tablet 0  . GLUCOSE BLOOD VI STRP  Use 1 strip three times a day, 1 box     . LORAZEPAM 1 MG PO TABS Oral Take 1 tablet (1 mg total) by mouth 3 (three) times daily as needed for anxiety. 20 tablet 0  . TRAZODONE 25 MG HALF TABLET Oral Take 0.5 tablets (25 mg total) by mouth at bedtime. 7 tablet 0    BP 152/92  Pulse 85  Temp(Src) 98.9 F (37.2 C) (Oral)  Resp 17  SpO2 95%  Physical Exam  Nursing note and vitals reviewed. Constitutional: She is oriented to person, place, and time. She appears well-developed.       Obese  HENT:  Head: Normocephalic and atraumatic.  Eyes: Conjunctivae and EOM are normal. Pupils are equal, round, and reactive to light.  Neck: Normal range of motion and phonation normal. Neck supple.  Cardiovascular: Normal rate, regular rhythm and intact distal  pulses.   Pulmonary/Chest: Effort normal and breath sounds normal. She exhibits no tenderness.  Abdominal: Soft. She exhibits no distension. There is no tenderness. There is no guarding.  Musculoskeletal: Normal range of motion.  Neurological: She is alert and oriented to person, place, and time. She has normal strength. She exhibits normal muscle tone.  Skin: Skin is warm and dry.  Psychiatric: Her behavior is normal. Judgment and thought content normal.       Depressed, tearful    ED Course  Procedures (including critical care time) 1229- ACT Consult   Labs Reviewed  CBC - Abnormal; Notable for the following:    Hemoglobin 10.1 (*)    HCT 32.9 (*)    MCH 24.9 (*)    Platelets 659 (*)    All other components within normal limits  COMPREHENSIVE METABOLIC PANEL - Abnormal; Notable for the following:    Glucose, Bld 142 (*)    Creatinine, Ser 1.67 (*)    Total Protein 8.6 (*)    Total Bilirubin 0.2 (*)    GFR calc non Af Amer 37 (*)    GFR calc Af Amer 43 (*)    All other components within normal limits  ETHANOL  ACETAMINOPHEN LEVEL  URINE RAPID DRUG SCREEN (HOSP PERFORMED)  LAB REPORT - SCANNED   No results found.   1. Hip pain, right   2. Depression   3. Suicidal ideation       MDM  Depression with suicidal ideation. Patient medically cleared for psychiatric admission.      Care to Dr Eliane Decree. Consult ACT in ED.   Richarda Blade, MD 05/28/11 [phone]

## 2011-05-27 NOTE — Progress Notes (Signed)
S: Pt comes in today for worsening depression and SUICIDAL IDEATION.  Patient is followed by the [facility] and was most recently seen yesterday, 05/26/10.  Was tearful at that appointment and was told that she should restart therapy.  No med changes were made.  She becomes tearful immediately when I enter the room and throughout the conversation.  Tearful about therapy because her therapist has left and she feels like she needs to "start all over."  Pt reports that she is in a "very dark place" and feels like she "keeps getting lower" like she is in a "dark circle or spiral."  + SI but no HI.  Does not have a current plan but has history of a suicide attempt by OD 05/2010.  Does not feel safe going home.  Patient states she started this downward spiral since last Thursday.  New/recent stressors include her son, who is homeless with bipolar d/o and ADHD not on medications or well controlled, coming back to live with her recently because she "felt so bad for him."  She denies any substance abuse.  She denies any hallucinations.  She does c/o racing thoughts and inability to sleep since Thursday.    Patient states she does not feel safe going home.  Feels like she will "never get better" and that "this will never end."  She is able to contract for safety in order to drive herself from clinic to the [facility] for further behavioral health evaluation.  I feel that her mental status/thought process is well enough to allow her to contract for safety for her drive.   ROS: Per HPI  History  Smoking status  . Never Smoker   Smokeless tobacco  . Not on file    O:  Filed Vitals:   05/27/11 0947  BP: 107/81  Pulse: 105    Gen: upset but NAD Psych: alert and oriented, appropriate, tearful and crying during the duration of her exam; breaks into tears upon my entering the room; does not appear distracted or to be hallucinating; normal train of thought, speech not pressured   A/P: 41 y.o. female p/w suicidal  ideation in the setting of chronic/recurrent depression and h/o suicide attempt 1 year ago -See problem list -got directly to [facility] for hopeful admission to [facility].   Patient and plan of care discussed with Dr. Martinique, Dr. Gwenlyn Saran, and Dr. Doreene Nest who are all in agreement with patient being sent to [facility] for further treatment.

## 2011-07-16 ENCOUNTER — Other Ambulatory Visit (HOSPITAL_COMMUNITY): Payer: Self-pay | Admitting: *Deleted

## 2011-07-21 ENCOUNTER — Encounter (HOSPITAL_COMMUNITY): Payer: BC Managed Care – PPO

## 2011-08-25 ENCOUNTER — Encounter (HOSPITAL_COMMUNITY)
Admission: RE | Admit: 2011-08-25 | Discharge: 2011-08-25 | Disposition: A | Discharge status: Home / Self Care | Payer: BC Managed Care – PPO | Source: Ambulatory Visit | Attending: Nephrology | Admitting: Nephrology

## 2011-08-25 DIAGNOSIS — D509 Iron deficiency anemia, unspecified: Secondary | ICD-10-CM | POA: Insufficient documentation

## 2011-08-25 MED ORDER — SODIUM CHLORIDE 0.9 % IV SOLN
INTRAVENOUS | Status: DC
  Administered 2011-08-25: 15:00:00 via INTRAVENOUS

## 2011-08-25 MED ORDER — FERUMOXYTOL INJECTION 510 MG/17 ML
510.0000 mg | Freq: Once | INTRAVENOUS | Status: AC
  Administered 2011-08-25: 510 mg via INTRAVENOUS
  Filled 2011-08-25: qty 17

## 2011-09-01 ENCOUNTER — Encounter (HOSPITAL_COMMUNITY): Payer: BC Managed Care – PPO

## 2011-09-22 ENCOUNTER — Encounter (HOSPITAL_COMMUNITY): Payer: BC Managed Care – PPO

## 2011-10-03 ENCOUNTER — Ambulatory Visit (INDEPENDENT_AMBULATORY_CARE_PROVIDER_SITE_OTHER): Payer: BC Managed Care – PPO | Admitting: Family Medicine

## 2011-10-03 ENCOUNTER — Encounter: Payer: Self-pay | Admitting: Family Medicine

## 2011-10-03 VITALS — BP 136/84 | HR 88 | Temp 98.0°F | Ht 71.0 in | Wt 389.4 lb

## 2011-10-03 DIAGNOSIS — E118 Type 2 diabetes mellitus with unspecified complications: Secondary | ICD-10-CM

## 2011-10-03 DIAGNOSIS — E1165 Type 2 diabetes mellitus with hyperglycemia: Secondary | ICD-10-CM

## 2011-10-03 DIAGNOSIS — L259 Unspecified contact dermatitis, unspecified cause: Secondary | ICD-10-CM

## 2011-10-03 MED ORDER — LEVOTHYROXINE SODIUM 200 MCG PO TABS: 200.0000 ug | ORAL_TABLET | Freq: Every day | ORAL | Status: DC

## 2011-10-03 MED ORDER — TRIAMCINOLONE ACETONIDE 0.1 % EX CREA: TOPICAL_CREAM | Freq: Two times a day (BID) | CUTANEOUS | Status: DC

## 2011-10-03 MED ORDER — GLIMEPIRIDE 2 MG PO TABS: 2.0000 mg | ORAL_TABLET | ORAL | Status: DC

## 2011-10-03 MED ORDER — HYDROXYZINE HCL 10 MG PO TABS: 10.0000 mg | ORAL_TABLET | Freq: Three times a day (TID) | ORAL | Status: AC | PRN

## 2011-10-03 MED ORDER — CARVEDILOL 25 MG PO TABS: 25.0000 mg | ORAL_TABLET | Freq: Two times a day (BID) | ORAL | Status: DC

## 2011-10-03 NOTE — Patient Instructions (Addendum)
Take the Atarax at night for itching.  Use the Triamcinolone cream twice a day for itching.   If you're still having trouble in 2 weeks come back and see [name].

## 2011-10-03 NOTE — Progress Notes (Signed)
Patient ID: [name], female   DOB: 1970/04/06, 42 y.o.   MRN: [id number] [name] is a 41 y.o. female who presents to [facility] today for rash of 1 weeks duration:  1.  Rash:  Patient states she began having increased itching in her thighs about 1 week ago.  Has never had trouble with this before.  Feels some "bumps" there.  Has only tried moisturizer for relief.  No dysuria or vaginal discharge.  No other lesions or rash noted.  No recent illnesses. Feels it is getting slightly worse.    The following portions of the patient's history were reviewed and updated as appropriate: allergies, current medications, past medical history, family and social history, and problem list.  Patient is a nonsmoker.   ROS as above otherwise neg. No Chest pain, palpitations, SOB, Fever, Chills, Abd pain, N/V/D.  Medications reviewed. Current Outpatient Prescriptions  Medication Sig Dispense Refill  . albuterol (PROAIR HFA) 108 (90 BASE) MCG/ACT inhaler As directed       . BD ULTRA-FINE LANCETS lancets 1 box       . Blood Glucose Monitoring Suppl (BLOOD GLUCOSE METER) kit Use as directed with test strips and lancets for  daily checks       . carvedilol (COREG) 25 MG tablet Take 1 tablet (25 mg total) by mouth 2 (two) times daily.  60 tablet  11  . diphenhydrAMINE (BENADRYL) 25 mg capsule Take 25 mg by mouth at bedtime as needed. sleep      . FLUoxetine (PROZAC) 10 MG tablet Take 10 mg by mouth daily. PT TAKES WITH 60 MG FOR A TOTAL OF 70 MG      . FLUoxetine (PROZAC) 20 MG tablet Take 60 mg by mouth See admin instructions. TAKES 3TABS FOR 60  MG DOSE, ALONG WITH 10 MG FOR A TOTAL DOSE OF 70 MG daily.      Marland Kitchen FLUoxetine 60 MG TABS Take 60 mg by mouth daily.  14 tablet  0  . gabapentin (NEURONTIN) 100 MG capsule Take 1 capsule (100 mg total) by mouth 3 (three) times daily.  90 capsule  3  . glimepiride (AMARYL) 2 MG tablet Take 1 tablet (2 mg total) by mouth every morning.  30 tablet  11  . glucose blood  (FREESTYLE LITE) test strip Use 1 strip three times a day, 1 box       . haloperidol (HALDOL) 1 MG tablet Take 1 mg by mouth 2 (two) times daily. Per mental health       . levothyroxine (LEVOXYL) 200 MCG tablet Take 1 tablet (200 mcg total) by mouth daily.  30 tablet  6  . LORazepam (ATIVAN) 1 MG tablet Take 1 mg by mouth 2 (two) times daily as needed. for anxiety      . LORazepam (ATIVAN) 1 MG tablet Take 1 tablet (1 mg total) by mouth 3 (three) times daily as needed for anxiety.  20 tablet  0  . POLYETHYLENE GLYCOL 3350 PO 17 GM in water daily for constipation., QS       . traZODone (DESYREL) 25 mg TABS Take 0.5 tablets (25 mg total) by mouth at bedtime.  7 tablet  0  . traZODone (DESYREL) 50 MG tablet Take 50 mg by mouth at bedtime.         Exam:  BP 136/84  Pulse 88  Temp 98 F (36.7 C) (Oral)  Ht 5\' 11"  (1.803 m)  Wt 389 lb 6.4 oz (176.631  kg)  BMI 54.31 kg/m2  LMP 09/11/2011 Gen: Well NAD Skin:  Some mild erythema noted medial aspects of thighs.  Does not extend to groin or inguinal skin folds.  I do not palpate any papules.  No other lesions noted.    Exam conducted with nurse chaperone in room throughout entire exam.   No results found for this or any previous visit (from the past 72 hour(s)).

## 2011-10-06 ENCOUNTER — Encounter: Payer: Self-pay | Admitting: Family Medicine

## 2011-10-06 NOTE — Assessment & Plan Note (Signed)
Unclear etiology.  No new soaps, shampoos, etc according to patient.  Triamcinolone for relief and atarax for itching relief.  Drowsy precautions provided, no driving after Atarax use. FU in 1 week if no better or sooner if worsening.

## 2011-11-09 ENCOUNTER — Encounter (HOSPITAL_COMMUNITY): Payer: Self-pay | Admitting: *Deleted

## 2011-11-09 ENCOUNTER — Emergency Department (HOSPITAL_COMMUNITY)
Admission: EM | Admit: 2011-11-09 | Discharge: 2011-11-09 | Disposition: A | Discharge status: Home / Self Care | Payer: BC Managed Care – PPO | Attending: Emergency Medicine | Admitting: Emergency Medicine

## 2011-11-09 DIAGNOSIS — F3289 Other specified depressive episodes: Secondary | ICD-10-CM | POA: Insufficient documentation

## 2011-11-09 DIAGNOSIS — F411 Generalized anxiety disorder: Secondary | ICD-10-CM | POA: Insufficient documentation

## 2011-11-09 DIAGNOSIS — E119 Type 2 diabetes mellitus without complications: Secondary | ICD-10-CM | POA: Insufficient documentation

## 2011-11-09 DIAGNOSIS — F329 Major depressive disorder, single episode, unspecified: Secondary | ICD-10-CM | POA: Insufficient documentation

## 2011-11-09 DIAGNOSIS — B86 Scabies: Secondary | ICD-10-CM | POA: Insufficient documentation

## 2011-11-09 DIAGNOSIS — N179 Acute kidney failure, unspecified: Secondary | ICD-10-CM | POA: Insufficient documentation

## 2011-11-09 MED ORDER — PERMETHRIN 5 % EX CREA: TOPICAL_CREAM | CUTANEOUS | Status: AC

## 2011-11-09 NOTE — ED Notes (Signed)
Pt states she has hives since July 26th. Pt given rx medicaiton and lotion for the hives and she still has small areas on her stomach, back, inner thighs. Pt states very itchy still.

## 2011-11-09 NOTE — ED Provider Notes (Signed)
History     CSN: [id number]  Arrival date & time 11/09/11  [id number]   First MD Initiated Contact with Patient 11/09/11 2107      Chief Complaint  Patient presents with  . Recurrent Skin Infections   HPI  Provided by the patient. Patient is a 41 year old female with history of anxiety, depression and diabetes who presents with complaints of persistent pruritic rash to her hands, abdomen and groin area. Patient states symptoms have been present since late July. She has tried several hydrocortisone creams to help with symptoms without improvement. Patient has been taking Benadryl regularly without change. Patient states rash seems to have worsened. Patient denies any other symptoms. Denies any fever, chills or sweats. Denies difficulty breathing or swelling of the throat     Past Medical History  Diagnosis Date  . Suicide attempt by drug ingestion 06/05/10    result rhabdomyolosis and ARF requrining dialysis   . Rhabdomyolysis 06/06/10    after drug overdose  . Acute renal failure 05/27/10    hemodialysis for 6 weeks  . Anxiety   . Diabetes mellitus   . Depression     Past Surgical History  Procedure Date  . Tubal ligation 1998  . Cholecystectomy     History reviewed. No pertinent family history.  History  Substance Use Topics  . Smoking status: Never Smoker   . Smokeless tobacco: Not on file  . Alcohol Use: No    OB History    Grav Para Term Preterm Abortions TAB SAB Ect Mult Living                  Review of Systems  Constitutional: Negative for fever and chills.  HENT: Negative for sore throat and neck pain.   Respiratory: Negative for cough and shortness of breath.   Skin: Positive for rash.  Neurological: Negative for headaches.    Allergies  Ace inhibitors; Doxepin; Latex; Metformin and related; and Nsaids  Home Medications   Current Outpatient Rx  Name Route Sig Dispense Refill  . ALBUTEROL SULFATE HFA 108 (90 BASE) MCG/ACT IN AERS Inhalation Inhale 2  puffs into the lungs every 4 (four) hours as needed. For shortness of breath    . CARVEDILOL 25 MG PO TABS Oral Take 1 tablet (25 mg total) by mouth 2 (two) times daily. 60 tablet 2  . DIPHENHYDRAMINE HCL 25 MG PO CAPS Oral Take 25 mg by mouth at bedtime as needed. sleep    . FLUOXETINE HCL 40 MG PO CAPS Oral Take 40 mg by mouth 2 (two) times daily.    Marland Kitchen GLIMEPIRIDE 2 MG PO TABS Oral Take 1 tablet (2 mg total) by mouth every morning. 30 tablet 2  . HALOPERIDOL 1 MG PO TABS Oral Take 5 mg by mouth daily. Per mental health    . LEVOTHYROXINE SODIUM 200 MCG PO TABS Oral Take 1 tablet (200 mcg total) by mouth daily. 30 tablet 1    Needs lab work before any more refills.  Marland Kitchen LORAZEPAM 1 MG PO TABS Oral Take 1 mg by mouth 2 (two) times daily as needed. for anxiety    . FLUOXETINE HCL 60 MG PO TABS Oral Take 60 mg by mouth daily. 14 tablet 0  . LORAZEPAM 1 MG PO TABS Oral Take 1 tablet (1 mg total) by mouth 3 (three) times daily as needed for anxiety. 20 tablet 0    BP 150/92  Pulse 97  Resp 16  SpO2 98%  Physical  Exam  Nursing note and vitals reviewed. Constitutional: She is oriented to person, place, and time. She appears well-developed and well-nourished. No distress.  HENT:  Head: Normocephalic.  Neck: Normal range of motion. Neck supple.  Cardiovascular: Normal rate and regular rhythm.   Pulmonary/Chest: Effort normal and breath sounds normal. No stridor. No respiratory distress. She has no rales.  Neurological: She is alert and oriented to person, place, and time.  Skin: Skin is warm and dry. Rash noted.       Papular rash to dorsal hands and web spacing bilaterally.  Many secondary excoriations at different stages of healing.  Similar findings across lower abdomen.  No erythema.  No vesicles.  No induration of skin.  Psychiatric: She has a normal mood and affect. Her behavior is normal.    ED Course  Procedures    1. Scabies       MDM  Seen and evaluated. Rash has been  present for several weeks and unimproved with topical hydrocortisone pains. Rash and symptoms are limited to the hands, wrists, lower abdomen, groin and buttocks area. There is suspicion for possible scabies. We'll provide prescription for permethrin cream.  Rash on hands, lower abdomen, groin and buttocks area. Remaining skin normal. Significant pruritus.      Martie Lee, [place] 11/10/11 [phone]

## 2011-11-10 NOTE — ED Provider Notes (Signed)
Medical screening examination/treatment/procedure(s) were performed by non-physician practitioner and as supervising physician I was immediately available for consultation/collaboration.   Sharyon Cable, MD 11/10/11 [phone]

## 2011-12-18 ENCOUNTER — Encounter (HOSPITAL_COMMUNITY): Payer: Self-pay | Admitting: Emergency Medicine

## 2011-12-18 ENCOUNTER — Emergency Department (HOSPITAL_COMMUNITY): Payer: BC Managed Care – PPO

## 2011-12-18 ENCOUNTER — Emergency Department (HOSPITAL_COMMUNITY)
Admission: EM | Admit: 2011-12-18 | Discharge: 2011-12-18 | Disposition: A | Discharge status: Home / Self Care | Payer: BC Managed Care – PPO | Attending: Emergency Medicine | Admitting: Emergency Medicine

## 2011-12-18 DIAGNOSIS — Z9089 Acquired absence of other organs: Secondary | ICD-10-CM | POA: Insufficient documentation

## 2011-12-18 DIAGNOSIS — Y92410 Unspecified street and highway as the place of occurrence of the external cause: Secondary | ICD-10-CM | POA: Insufficient documentation

## 2011-12-18 DIAGNOSIS — R51 Headache: Secondary | ICD-10-CM | POA: Insufficient documentation

## 2011-12-18 DIAGNOSIS — R079 Chest pain, unspecified: Secondary | ICD-10-CM | POA: Insufficient documentation

## 2011-12-18 DIAGNOSIS — M542 Cervicalgia: Secondary | ICD-10-CM | POA: Insufficient documentation

## 2011-12-18 DIAGNOSIS — Z79899 Other long term (current) drug therapy: Secondary | ICD-10-CM | POA: Insufficient documentation

## 2011-12-18 DIAGNOSIS — M545 Low back pain, unspecified: Secondary | ICD-10-CM | POA: Insufficient documentation

## 2011-12-18 DIAGNOSIS — E119 Type 2 diabetes mellitus without complications: Secondary | ICD-10-CM | POA: Insufficient documentation

## 2011-12-18 LAB — CBC WITH DIFFERENTIAL/PLATELET
Basophils Absolute: 0 K/uL (ref 0.0–0.1)
Basophils Relative: 0 % (ref 0–1)
Eosinophils Absolute: 0.2 K/uL (ref 0.0–0.7)
Eosinophils Relative: 2 % (ref 0–5)
HCT: 31.2 % — ABNORMAL LOW (ref 36.0–46.0)
Hemoglobin: 9.8 g/dL — ABNORMAL LOW (ref 12.0–15.0)
Lymphocytes Relative: 31 % (ref 12–46)
Lymphs Abs: 3 K/uL (ref 0.7–4.0)
MCH: 25.9 pg — ABNORMAL LOW (ref 26.0–34.0)
MCHC: 31.4 g/dL (ref 30.0–36.0)
MCV: 82.3 fL (ref 78.0–100.0)
Monocytes Absolute: 0.8 K/uL (ref 0.1–1.0)
Monocytes Relative: 9 % (ref 3–12)
Neutro Abs: 5.5 K/uL (ref 1.7–7.7)
Neutrophils Relative %: 58 % (ref 43–77)
Platelets: 462 K/uL — ABNORMAL HIGH (ref 150–400)
RBC: 3.79 MIL/uL — ABNORMAL LOW (ref 3.87–5.11)
RDW: 13.9 % (ref 11.5–15.5)
WBC: 9.5 K/uL (ref 4.0–10.5)

## 2011-12-18 LAB — BASIC METABOLIC PANEL
BUN: 23 mg/dL (ref 6–23)
CO2: 23 mEq/L (ref 19–32)
Chloride: 105 mEq/L (ref 96–112)
Creatinine, Ser: 1.55 mg/dL — ABNORMAL HIGH (ref 0.50–1.10)
GFR calc Af Amer: 47 mL/min — ABNORMAL LOW (ref >90)
Glucose, Bld: 136 mg/dL — ABNORMAL HIGH (ref 70–99)
Potassium: 5 mEq/L (ref 3.5–5.1)

## 2011-12-18 LAB — POCT I-STAT, CHEM 8
BUN: 23 mg/dL (ref 6–23)
Calcium, Ion: 1.16 mmol/L (ref 1.12–1.23)
Chloride: 108 mEq/L (ref 96–112)
Creatinine, Ser: 1.6 mg/dL — ABNORMAL HIGH (ref 0.50–1.10)
TCO2: 22 mmol/L (ref 0–100)

## 2011-12-18 LAB — GLUCOSE, CAPILLARY: Glucose-Capillary: 141 mg/dL — ABNORMAL HIGH (ref 70–99)

## 2011-12-18 MED ORDER — LORAZEPAM 2 MG/ML IJ SOLN
1.0000 mg | Freq: Once | INTRAMUSCULAR | Status: AC
  Administered 2011-12-18: 1 mg via INTRAVENOUS
  Filled 2011-12-18: qty 1

## 2011-12-18 MED ORDER — LORAZEPAM 1 MG PO TABS: 1.0000 mg | ORAL_TABLET | Freq: Three times a day (TID) | ORAL | Status: DC | PRN

## 2011-12-18 NOTE — ED Notes (Signed)
Patient transported to X-ray 
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
## 2011-12-18 NOTE — ED Notes (Signed)
Husband and chaplain called per pt request.  Husband Lanny Hurst [phone] with message left on phone.  Chaplain states she is coming.

## 2011-12-18 NOTE — ED Notes (Signed)
Pt's CBG was 141 when I checked it.8:00pm JG.
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
## 2011-12-18 NOTE — ED Notes (Signed)
Pt returned from x-ray, no complaints at this time.

## 2012-01-14 ENCOUNTER — Encounter: Payer: Self-pay | Admitting: Emergency Medicine

## 2012-01-14 ENCOUNTER — Ambulatory Visit (INDEPENDENT_AMBULATORY_CARE_PROVIDER_SITE_OTHER): Payer: BC Managed Care – PPO | Admitting: Emergency Medicine

## 2012-01-14 VITALS — BP 167/105 | HR 83 | Ht 69.5 in | Wt 381.0 lb

## 2012-01-14 DIAGNOSIS — E1165 Type 2 diabetes mellitus with hyperglycemia: Secondary | ICD-10-CM

## 2012-01-14 DIAGNOSIS — Z23 Encounter for immunization: Secondary | ICD-10-CM

## 2012-01-14 DIAGNOSIS — I10 Essential (primary) hypertension: Secondary | ICD-10-CM

## 2012-01-14 DIAGNOSIS — E118 Type 2 diabetes mellitus with unspecified complications: Secondary | ICD-10-CM

## 2012-01-14 DIAGNOSIS — D649 Anemia, unspecified: Secondary | ICD-10-CM

## 2012-01-14 DIAGNOSIS — E039 Hypothyroidism, unspecified: Secondary | ICD-10-CM

## 2012-01-14 LAB — COMPREHENSIVE METABOLIC PANEL
Albumin: 3.8 g/dL (ref 3.5–5.2)
BUN: 19 mg/dL (ref 6–23)
Calcium: 9.3 mg/dL (ref 8.4–10.5)
Chloride: 103 mEq/L (ref 96–112)
Creat: 1.51 mg/dL — ABNORMAL HIGH (ref 0.50–1.10)
Glucose, Bld: 123 mg/dL — ABNORMAL HIGH (ref 70–99)
Potassium: 4.7 mEq/L (ref 3.5–5.3)

## 2012-01-14 LAB — CBC
HCT: 30.4 % — ABNORMAL LOW (ref 36.0–46.0)
Hemoglobin: 9.8 g/dL — ABNORMAL LOW (ref 12.0–15.0)
MCHC: 32.2 g/dL (ref 30.0–36.0)
MCV: 81.3 fL (ref 78.0–100.0)
RDW: 14.6 % (ref 11.5–15.5)
WBC: 9.9 10*3/uL (ref 4.0–10.5)

## 2012-01-14 LAB — TSH: TSH: 10.857 u[IU]/mL — ABNORMAL HIGH (ref 0.350–4.500)

## 2012-01-14 LAB — POCT GLYCOSYLATED HEMOGLOBIN (HGB A1C): Hemoglobin A1C: 8.9

## 2012-01-14 MED ORDER — CARVEDILOL 25 MG PO TABS: 25.0000 mg | ORAL_TABLET | Freq: Two times a day (BID) | ORAL | Status: DC

## 2012-01-14 MED ORDER — GLIMEPIRIDE 2 MG PO TABS: 2.0000 mg | ORAL_TABLET | ORAL | Status: DC

## 2012-01-14 MED ORDER — LEVOTHYROXINE SODIUM 200 MCG PO TABS: 200.0000 ug | ORAL_TABLET | Freq: Every day | ORAL | Status: DC

## 2012-01-14 MED ORDER — LOSARTAN POTASSIUM 25 MG PO TABS: 25.0000 mg | ORAL_TABLET | Freq: Every day | ORAL | Status: DC

## 2012-01-14 MED ORDER — GLUCOSE BLOOD VI STRP: ORAL_STRIP | Status: DC

## 2012-01-14 MED ORDER — ONETOUCH ULTRA SYSTEM W/DEVICE KIT: 1.0000 | PACK | Freq: Once | Status: DC

## 2012-01-14 MED ORDER — ACCU-CHEK MULTICLIX LANCETS MISC: Status: DC

## 2012-01-14 NOTE — Assessment & Plan Note (Signed)
Uncontrolled with A1c 8.9% today.  Continue amaryl.  Prescription for glucometer sent to pharmacy.  Patient to check fasting CBG and 1 2hr PP daily.  Follow up in 2-4 weeks.  Will likely need to increase Amayrl and/or start [place]. May be worthwhile to consider starting Lantus as she is unable to tolerate metformin.

## 2012-01-14 NOTE — Patient Instructions (Addendum)
It was nice to meet you! I will have that paperwork ready for you by Friday. We are going to start Losartan for your blood pressure.  Take it once a day.   We will check some blood work today. I sent a prescription to your pharmacy for a glucose meter.  Your insurance should cover it.  Let me know if there are any problems getting it. Please check your sugar every morning before eating and 2 hours after lunch. Follow up in 2-4 weeks to check on your blood pressure and blood sugars.

## 2012-01-14 NOTE — Assessment & Plan Note (Signed)
Uncontrolled.  Will continue carvedilol 25mg  BID.  Will also start low-dose losartan 25mg  daily.  May need to d/c this if it bumps her creatinine.  Follow up in 2-4 weeks to recheck creatinine and BP.
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
## 2012-01-16 ENCOUNTER — Telehealth: Payer: Self-pay | Admitting: Emergency Medicine

## 2012-01-16 NOTE — Telephone Encounter (Signed)
Pt is asking about DMV papers to see if they are ready

## 2012-01-16 NOTE — Telephone Encounter (Signed)
Forward to PCP, note in chart says papers will be ready for patient today?

## 2012-01-19 NOTE — Telephone Encounter (Signed)
I left a message for her on Friday.  The form has already been picked up.
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
## 2012-01-23 NOTE — Telephone Encounter (Signed)
Waiting for pt to call back. (Dr.Booth has filled out a form and it was picked up. What kind of letter does pt need?) .[name]

## 2012-01-28 NOTE — Telephone Encounter (Signed)
Letter created and routed to the red pool to be mailed to the [facility].

## 2012-02-04 ENCOUNTER — Encounter: Payer: Self-pay | Admitting: Emergency Medicine

## 2012-02-04 ENCOUNTER — Ambulatory Visit (INDEPENDENT_AMBULATORY_CARE_PROVIDER_SITE_OTHER): Payer: BC Managed Care – PPO | Admitting: Emergency Medicine

## 2012-02-04 VITALS — BP 145/86 | HR 84 | Ht 69.5 in | Wt 377.0 lb

## 2012-02-04 DIAGNOSIS — I10 Essential (primary) hypertension: Secondary | ICD-10-CM

## 2012-02-04 DIAGNOSIS — E039 Hypothyroidism, unspecified: Secondary | ICD-10-CM

## 2012-02-04 LAB — BASIC METABOLIC PANEL
Calcium: 9.2 mg/dL (ref 8.4–10.5)
Potassium: 4.8 mEq/L (ref 3.5–5.3)
Sodium: 140 mEq/L (ref 135–145)

## 2012-02-04 MED ORDER — LEVOTHYROXINE SODIUM 125 MCG PO TABS: 250.0000 ug | ORAL_TABLET | Freq: Every day | ORAL | Status: DC
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
## 2012-02-04 NOTE — Assessment & Plan Note (Signed)
Will increase dose of synthroid as she reports no missed doses and recent TSH elevated.

## 2012-02-04 NOTE — Assessment & Plan Note (Signed)
Still above goal, but much improved on losartan.  Will check BMP for kidney function today.

## 2012-04-27 ENCOUNTER — Ambulatory Visit (INDEPENDENT_AMBULATORY_CARE_PROVIDER_SITE_OTHER): Payer: BC Managed Care – PPO | Admitting: Family Medicine

## 2012-04-27 ENCOUNTER — Encounter: Payer: Self-pay | Admitting: Family Medicine

## 2012-04-27 VITALS — BP 138/86 | HR 85 | Temp 99.2°F | Ht 69.5 in | Wt 380.5 lb

## 2012-04-27 DIAGNOSIS — A084 Viral intestinal infection, unspecified: Secondary | ICD-10-CM

## 2012-04-27 DIAGNOSIS — E039 Hypothyroidism, unspecified: Secondary | ICD-10-CM

## 2012-04-27 DIAGNOSIS — E1165 Type 2 diabetes mellitus with hyperglycemia: Secondary | ICD-10-CM

## 2012-04-27 DIAGNOSIS — A088 Other specified intestinal infections: Secondary | ICD-10-CM

## 2012-04-27 LAB — GLUCOSE, CAPILLARY: Glucose-Capillary: 145 mg/dL — ABNORMAL HIGH (ref 70–99)

## 2012-04-27 MED ORDER — ONETOUCH ULTRA SYSTEM W/DEVICE KIT: PACK | Status: DC

## 2012-04-27 NOTE — Assessment & Plan Note (Signed)
cbg 145 today- not a contributor to nausea.  Wrote rx for glucometer.  Advised due for office visit this month with CPP to follow-up on diabetes

## 2012-04-27 NOTE — Assessment & Plan Note (Signed)
Notes TSH was elevated at last visit, patients synthroid was increased.  Not liekly related to acute onset of gastroenteritis symptoms.  Patient declines labwork today- will follow-up with PCP

## 2012-04-27 NOTE — Progress Notes (Signed)
  Subjective:    Patient ID: [name], female    DOB: 09-29-1970, 42 y.o.   MRN: [id number]  HPI 5 days ago started with abdominal pain and nausea  Had a large BM and diarrhea.  Now with continued nausea.  Has been taking pepto-bismol and zofran.  No fever. No sick contacts, no new foods or known sources of illness.  Points to mid abdomen.  Overall thinks adominal pain is worse.  No dysuria, no emesis, no vaginal discharge or pelvic pain  Low appetitie.  Drinking ok.  States has not checked sugars.  Last Period January- not due yet.  No new medicines.  I have reviewed patient's  PMH, FH, and Social history and Medications as related to this visit. History of cholecystectoym.   Review of Systemssee HPI     Objective:   Physical Exam GEN: Alert & Oriented, No acute distress, here with her husband.  Well appearing.  Comfortable- able to walk and get onot exam table without pain.  Obese CV:  Regular Rate & Rhythm, no murmur Respiratory:  Normal work of breathing, CTAB Abd:  + BS, soft, no tenderness to palpation         Assessment & Plan:

## 2012-04-27 NOTE — Assessment & Plan Note (Signed)
Resolving.  No signs of intraabdominal etiology such as pancreatitis, has had cholecystectomy, history not consistent with UTI or pelvic infection.  Discussed supportive care, wrote work note for patient.

## 2012-04-27 NOTE — Patient Instructions (Addendum)
Viral Gastroenteritis Viral gastroenteritis is also called stomach flu. This illness is caused by a certain type of germ (virus). It can cause sudden watery poop (diarrhea) and throwing up (vomiting). This can cause you to lose body fluids (dehydration). This illness usually lasts for 3 to 8 days. It usually goes away on its own. HOME CARE   Drink enough fluids to keep your pee (urine) clear or pale yellow. Drink small amounts of fluids often.  Ask your doctor how to replace body fluid losses (rehydration).  Avoid:  Foods high in sugar.  Alcohol.  Bubbly (carbonated) drinks.  Tobacco.  Juice.  Caffeine drinks.  Very hot or cold fluids.  Fatty, greasy foods.  Eating too much at one time.  Dairy products until 24 to 48 hours after your watery poop stops.  You may eat foods with active cultures (probiotics). They can be found in some yogurts and supplements.  Wash your hands well to avoid spreading the illness.  Only take medicines as told by your doctor. Do not give aspirin to children. Do not take medicines for watery poop (antidiarrheals).  Ask your doctor if you should keep taking your regular medicines.  Keep all doctor visits as told. GET HELP RIGHT AWAY IF:   You cannot keep fluids down.  You do not pee at least once every 6 to 8 hours.  You are short of breath.  You see blood in your poop or throw up. This may look like coffee grounds.  You have belly (abdominal) pain that gets worse or is just in one small spot (localized).  You keep throwing up or having watery poop.  You have a fever.  The patient is a child younger than 3 months, and he or she has a fever.  The patient is a child older than 3 months, and he or she has a fever and problems that do not go away.  The patient is a child older than 3 months, and he or she has a fever and problems that suddenly get worse.  The patient is a baby, and he or she has no tears when crying. MAKE SURE YOU:     Understand these instructions.  Will watch your condition.  Will get help right away if you are not doing well or get worse. Document Released: 08/13/2007 Document Revised: 05/19/2011 Document Reviewed: 12/11/2010 [facility] Patient Information 2013 [place].

## 2012-07-04 ENCOUNTER — Telehealth: Payer: Self-pay | Admitting: Family Medicine
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
## 2012-07-07 ENCOUNTER — Encounter: Payer: Self-pay | Admitting: Family Medicine

## 2012-07-07 ENCOUNTER — Ambulatory Visit (INDEPENDENT_AMBULATORY_CARE_PROVIDER_SITE_OTHER): Payer: BC Managed Care – PPO | Admitting: Family Medicine

## 2012-07-07 VITALS — BP 142/83 | HR 89 | Temp 99.0°F | Ht 69.5 in | Wt 389.0 lb

## 2012-07-07 DIAGNOSIS — D649 Anemia, unspecified: Secondary | ICD-10-CM

## 2012-07-07 DIAGNOSIS — R5383 Other fatigue: Secondary | ICD-10-CM

## 2012-07-07 DIAGNOSIS — E118 Type 2 diabetes mellitus with unspecified complications: Secondary | ICD-10-CM

## 2012-07-07 DIAGNOSIS — L0292 Furuncle, unspecified: Secondary | ICD-10-CM | POA: Insufficient documentation

## 2012-07-07 DIAGNOSIS — R5381 Other malaise: Secondary | ICD-10-CM

## 2012-07-07 DIAGNOSIS — D509 Iron deficiency anemia, unspecified: Secondary | ICD-10-CM

## 2012-07-07 DIAGNOSIS — E039 Hypothyroidism, unspecified: Secondary | ICD-10-CM

## 2012-07-07 DIAGNOSIS — E1165 Type 2 diabetes mellitus with hyperglycemia: Secondary | ICD-10-CM

## 2012-07-07 LAB — CBC
HCT: 28.9 % — ABNORMAL LOW (ref 36.0–46.0)
Hemoglobin: 9.1 g/dL — ABNORMAL LOW (ref 12.0–15.0)
MCH: 24.1 pg — ABNORMAL LOW (ref 26.0–34.0)
MCV: 76.5 fL — ABNORMAL LOW (ref 78.0–100.0)
RBC: 3.78 MIL/uL — ABNORMAL LOW (ref 3.87–5.11)

## 2012-07-07 LAB — POCT GLYCOSYLATED HEMOGLOBIN (HGB A1C): Hemoglobin A1C: 8.3

## 2012-07-07 MED ORDER — FERROUS SULFATE 220 (44 FE) MG/5ML PO ELIX: 220.0000 mg | ORAL_SOLUTION | Freq: Every day | ORAL | Status: DC

## 2012-07-07 MED ORDER — SULFAMETHOXAZOLE-TRIMETHOPRIM 800-160 MG PO TABS: 1.0000 | ORAL_TABLET | Freq: Two times a day (BID) | ORAL | Status: DC

## 2012-07-07 NOTE — Assessment & Plan Note (Signed)
This is likely cause of her current fatigue. Have sent in oral iron elixir for her. We're rechecking iron panel today as this is not been checked since 2012. Also CBC today. Has been amenorrheic since January and checking U. pregnant today although this likely could be secondary to hypothyroidism

## 2012-07-07 NOTE — Assessment & Plan Note (Signed)
A1c is 8.3 today. She would likely benefit from another medication plus glimepiride as well as further diabetic education and increased activity levels. However she is currently too fatigued for activity. Please see anemia below

## 2012-07-07 NOTE — Assessment & Plan Note (Signed)
TSH showed hypothyroidism in the past We'll recheck today. This could be cause of some of her fatigue.

## 2012-07-07 NOTE — Assessment & Plan Note (Signed)
Treat with Bactrim and follow-up in 2-3 weeks to assess for resolution.
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
## 2012-07-07 NOTE — Patient Instructions (Addendum)
See if you are taking the Cozaar, Coreg, or both.  This are blood pressure medicines and you should be on both. Call us and tell us.      Take the liquid iron starting tonight.  Take the Bactrim for your spot.  We will call you with the results.

## 2012-07-08 ENCOUNTER — Telehealth: Payer: Self-pay | Admitting: Family Medicine

## 2012-07-08 LAB — TSH: TSH: 8.269 u[IU]/mL — ABNORMAL HIGH (ref 0.350–4.500)

## 2012-07-08 LAB — COMPREHENSIVE METABOLIC PANEL
ALT: 9 U/L (ref 0–35)
AST: 13 U/L (ref 0–37)
Albumin: 3.9 g/dL (ref 3.5–5.2)
Alkaline Phosphatase: 58 U/L (ref 39–117)
Chloride: 100 mEq/L (ref 96–112)
Potassium: 4.6 mEq/L (ref 3.5–5.3)
Sodium: 137 mEq/L (ref 135–145)
Total Protein: 7.5 g/dL (ref 6.0–8.3)

## 2012-07-08 LAB — IBC PANEL: TIBC: 396 ug/dL (ref 250–470)

## 2012-07-08 NOTE — Telephone Encounter (Signed)
Called and discussed lab results with patient. She is still feeling fatigued today. She has not yet picked up the iron elixir. I recommended she pick that up today. If she is still feeling symptomatically fatigued after starting her iron she should call early next week and we can consider iron transfusion at that time. Patient expressed understanding. She is going to get her iron now

## 2012-07-08 NOTE — Telephone Encounter (Signed)
Can you guys do this for her?  Thanks! Merry Proud

## 2012-07-08 NOTE — Telephone Encounter (Signed)
Pt was here yesterday and was out of work today - she is asking for a note to be out of work today - pls fax to her school -  Fax [phone] Attn: Zollie Beckers

## 2012-07-09 ENCOUNTER — Encounter: Payer: Self-pay | Admitting: Family Medicine

## 2012-07-09 NOTE — Telephone Encounter (Signed)
Patient is calling because she still wasn't feeling well today and wants a note to redone so that it includes Thursday and Friday and she is definitely going back to work on Monday.

## 2012-07-09 NOTE — Telephone Encounter (Signed)
Letter faxed out

## 2012-09-16 ENCOUNTER — Other Ambulatory Visit: Payer: Self-pay

## 2012-10-19 ENCOUNTER — Telehealth: Payer: Self-pay | Admitting: Emergency Medicine

## 2012-10-19 NOTE — Telephone Encounter (Signed)
error 

## 2012-10-20 ENCOUNTER — Encounter: Payer: Self-pay | Admitting: Emergency Medicine

## 2012-10-20 ENCOUNTER — Ambulatory Visit (INDEPENDENT_AMBULATORY_CARE_PROVIDER_SITE_OTHER): Payer: BC Managed Care – PPO | Admitting: Emergency Medicine

## 2012-10-20 VITALS — BP 141/83 | HR 87 | Ht 69.5 in | Wt 389.0 lb

## 2012-10-20 DIAGNOSIS — E1165 Type 2 diabetes mellitus with hyperglycemia: Secondary | ICD-10-CM

## 2012-10-20 DIAGNOSIS — F411 Generalized anxiety disorder: Secondary | ICD-10-CM

## 2012-10-20 DIAGNOSIS — R350 Frequency of micturition: Secondary | ICD-10-CM

## 2012-10-20 DIAGNOSIS — I10 Essential (primary) hypertension: Secondary | ICD-10-CM

## 2012-10-20 DIAGNOSIS — E039 Hypothyroidism, unspecified: Secondary | ICD-10-CM

## 2012-10-20 DIAGNOSIS — E118 Type 2 diabetes mellitus with unspecified complications: Secondary | ICD-10-CM

## 2012-10-20 LAB — GLUCOSE, CAPILLARY: Glucose-Capillary: 153 mg/dL — ABNORMAL HIGH (ref 70–99)

## 2012-10-20 LAB — TSH: TSH: 25.859 u[IU]/mL — ABNORMAL HIGH (ref 0.350–4.500)

## 2012-10-20 MED ORDER — AMLODIPINE BESYLATE 5 MG PO TABS: 5.0000 mg | ORAL_TABLET | Freq: Every day | ORAL | Status: DC

## 2012-10-20 MED ORDER — SITAGLIPTIN PHOSPHATE 50 MG PO TABS: 50.0000 mg | ORAL_TABLET | Freq: Every day | ORAL | Status: DC

## 2012-10-20 MED ORDER — LEVOTHYROXINE SODIUM 200 MCG PO TABS: 200.0000 ug | ORAL_TABLET | Freq: Every day | ORAL | Status: DC

## 2012-10-20 NOTE — Progress Notes (Signed)
  Subjective:    Patient ID: [name], female    DOB: 1970/06/09, 42 y.o.   MRN: [id number]  HPI [name] is here for frequent urination and chronic issues.  Frequent urination She reports feeling like she has to pee every 10 minutes.  The urine is clear and usually a small amount at a time.  No hematuria or dysuria.  No fevers or chills.  She is worried that this is due to her sugar being up.  Denies any urge incontinence.  Rare stress incontinence, but not bothersome to her.  Diabetes Well controlled: no Compliant with medications: yes Side effects from medications: no Check sugars at home: no  Polyuria: yes Polydipsia: yes Vision changes: yes - intermittent Hypoglycemic symptoms: no  Foot exam: done today Eye exam: will f/u with her eye doctor Microalbumin: needs to be done  Hypertension Well controlled: no Compliant with medication: no - has only been taking carvedilol; does not take losartan Side effects from medication: no Check BP at home: no  Chest pain: no Palpitations: no Vision changes: yes Leg edema: no Dizziness: no  Hypothyroid Reports taking 299mcg of synthroid daily.  Does not think her dose has changed recently, but is not sure.  No heat/cold intolerance or skin/nail/hair changes.  I have reviewed and updated the following as appropriate: allergies and current medications SHx: never smoker  Review of Systems See HPI    Objective:   Physical Exam BP 141/83  Pulse 87  Ht 5' 9.5" (1.765 m)  Wt 389 lb (176.449 kg)  BMI 56.64 kg/m2  LMP 02/22/2012 Gen: alert, cooperative, NAD HEENT: AT/[place], sclera white, MMM Neck: supple CV: RRR, no murmurs Pulm: CTAB, no wheezes or rales Ext: no edema, 1+ DP pulses bilaterally Foot: 3/5 monofilament bilaterally; no rashes or lesions or callus      Assessment & Plan:

## 2012-10-20 NOTE — Patient Instructions (Signed)
It was nice to see you!  Your diabetes is okay with an A1c of 7.6%.  I want to get this number below 7%. - Continue the glimepiride - START januvia 50mg  daily  We are going to add a blood pressure medicine called Amlodipine, take 1 pill once a day.  We are checking your thyroid today.  I will call you to let you know if we need to change the dose of your thyroid pill.  Please call the office, if the medicines are too expensive. Follow up in 1 month to check on the urination and your blood pressure.

## 2012-10-20 NOTE — Assessment & Plan Note (Signed)
>>  ASSESSMENT AND PLAN FOR ANXIETY WRITTEN ON 10/20/2012  9:58 AM BY HONIG, Finis Bud, MD  Followed at [facility]. New prescriptions for buspar, prozac, haldol, gabapentin, clonidine, and ativan given on Monday.

## 2012-10-20 NOTE — Assessment & Plan Note (Signed)
A1c 7.6 today, which is improved. Will start Januvia 50mg  daily.

## 2012-10-20 NOTE — Assessment & Plan Note (Signed)
Frequent small amounts of urine both during the day and night. Patient concerned that it is related to diabetes. I think this is unlikely to be related to her diabetes given an A1c of 7.6%.   No signs or symptoms of infection.  No report of incontinence. I suspect she may have an overactive bladder, but working on diabetes control is a reasonable place to start. Discussed that controlling her sugars may not fix this problem.  Follow up in 1 month.

## 2012-10-20 NOTE — Assessment & Plan Note (Signed)
Mildly elevated today. Only on carvedilol 25mg  BID. Will start amlodipine 5mg  daily. 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 Follow up in 1 month.

## 2012-10-20 NOTE — Assessment & Plan Note (Signed)
Followed at [facility]. New prescriptions for buspar, prozac, haldol, gabapentin, clonidine, and ativan given on Monday.

## 2012-10-20 NOTE — Assessment & Plan Note (Addendum)
Last TSH was in 06/2012 and was elevated at 8. Unclear if dose has changed recently as EPIC says she is on 290mcg daily but she has 261mcg pills. Will check TSH today and call with the results.

## 2012-10-21 ENCOUNTER — Telehealth: Payer: Self-pay | Admitting: Emergency Medicine

## 2012-10-21 DIAGNOSIS — E039 Hypothyroidism, unspecified: Secondary | ICD-10-CM

## 2012-10-21 DIAGNOSIS — E1165 Type 2 diabetes mellitus with hyperglycemia: Secondary | ICD-10-CM

## 2012-10-21 DIAGNOSIS — F411 Generalized anxiety disorder: Secondary | ICD-10-CM

## 2012-10-21 DIAGNOSIS — I10 Essential (primary) hypertension: Secondary | ICD-10-CM

## 2012-10-21 DIAGNOSIS — R350 Frequency of micturition: Secondary | ICD-10-CM

## 2012-10-21 MED ORDER — LEVOTHYROXINE SODIUM 125 MCG PO TABS: 250.0000 ug | ORAL_TABLET | Freq: Every day | ORAL | Status: DC

## 2012-10-21 NOTE — Telephone Encounter (Signed)
Called and spoke with patient regarding TSH results.  Synthroid dose increased to 262mcg (175mcg tablets, take 2 daily).  She will make a lab appt for 6 weeks to recheck the TSH.  She expressed understanding with the medication change.

## 2012-10-22 IMAGING — CR DG CHEST 1V PORT
1 series · 1 of 1 positions shown · non-contrast
Comparison: 05/31/2008 and 01/15/2005.

CLINICAL DATA: Dialysis catheter placement.

PORTABLE CHEST - 1 VIEW

[view not recorded]
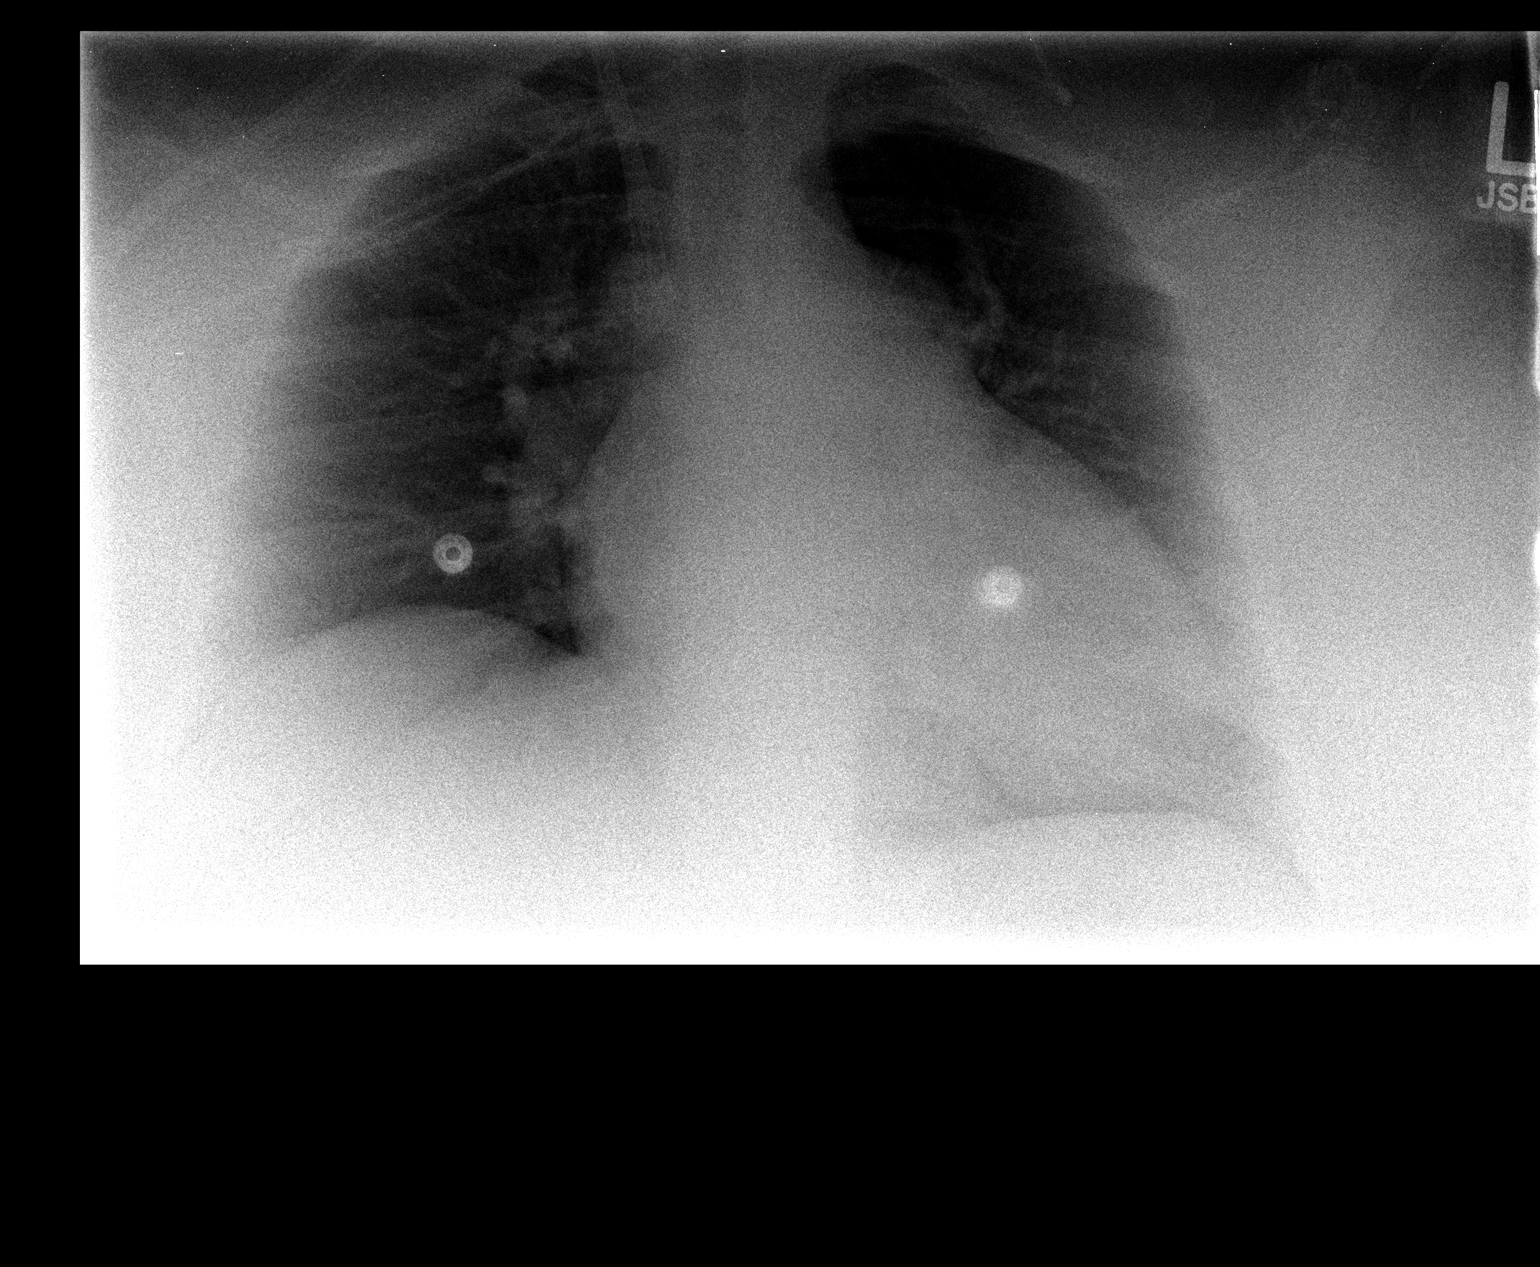

[1 of 1 positions shown; findings below may reference images not displayed]

FINDINGS: 8887 hours.  Right IJ dialysis catheter tips are within
the SVC.  Detail is limited by body habitus.  The heart remains
enlarged.  There is vascular congestion without overt pulmonary
edema.  There is no evidence of pneumothorax or pleural effusion.
IMPRESSION: Dialysis catheter placed without demonstrated complication.

## 2012-10-24 IMAGING — CR DG TIBIA/FIBULA 2V*R*
2 series · 2 of 2 positions shown · non-contrast
Comparison: None.

CLINICAL DATA: Fall.  Right leg injury and pain.  Morbid obesity.

RIGHT TIBIA AND FIBULA - 2 VIEW

[view not recorded (1 of 2)]
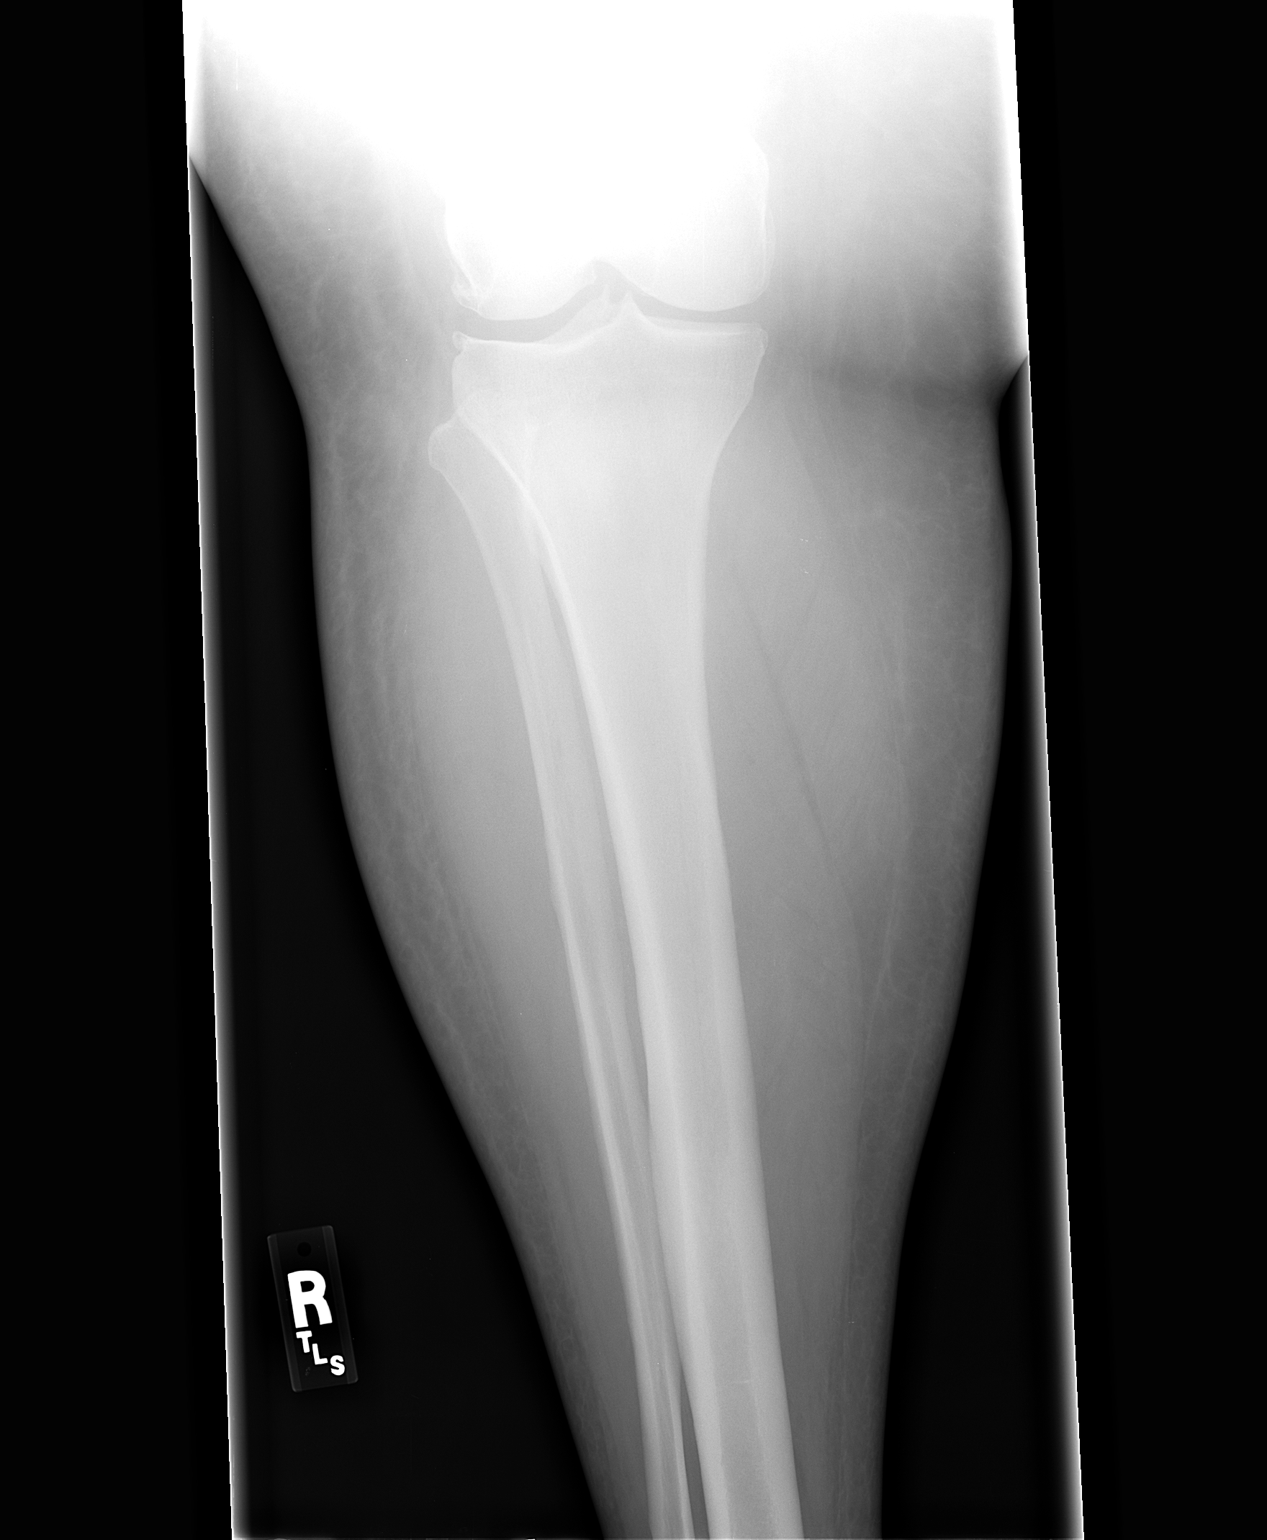

[view not recorded (2 of 2)]
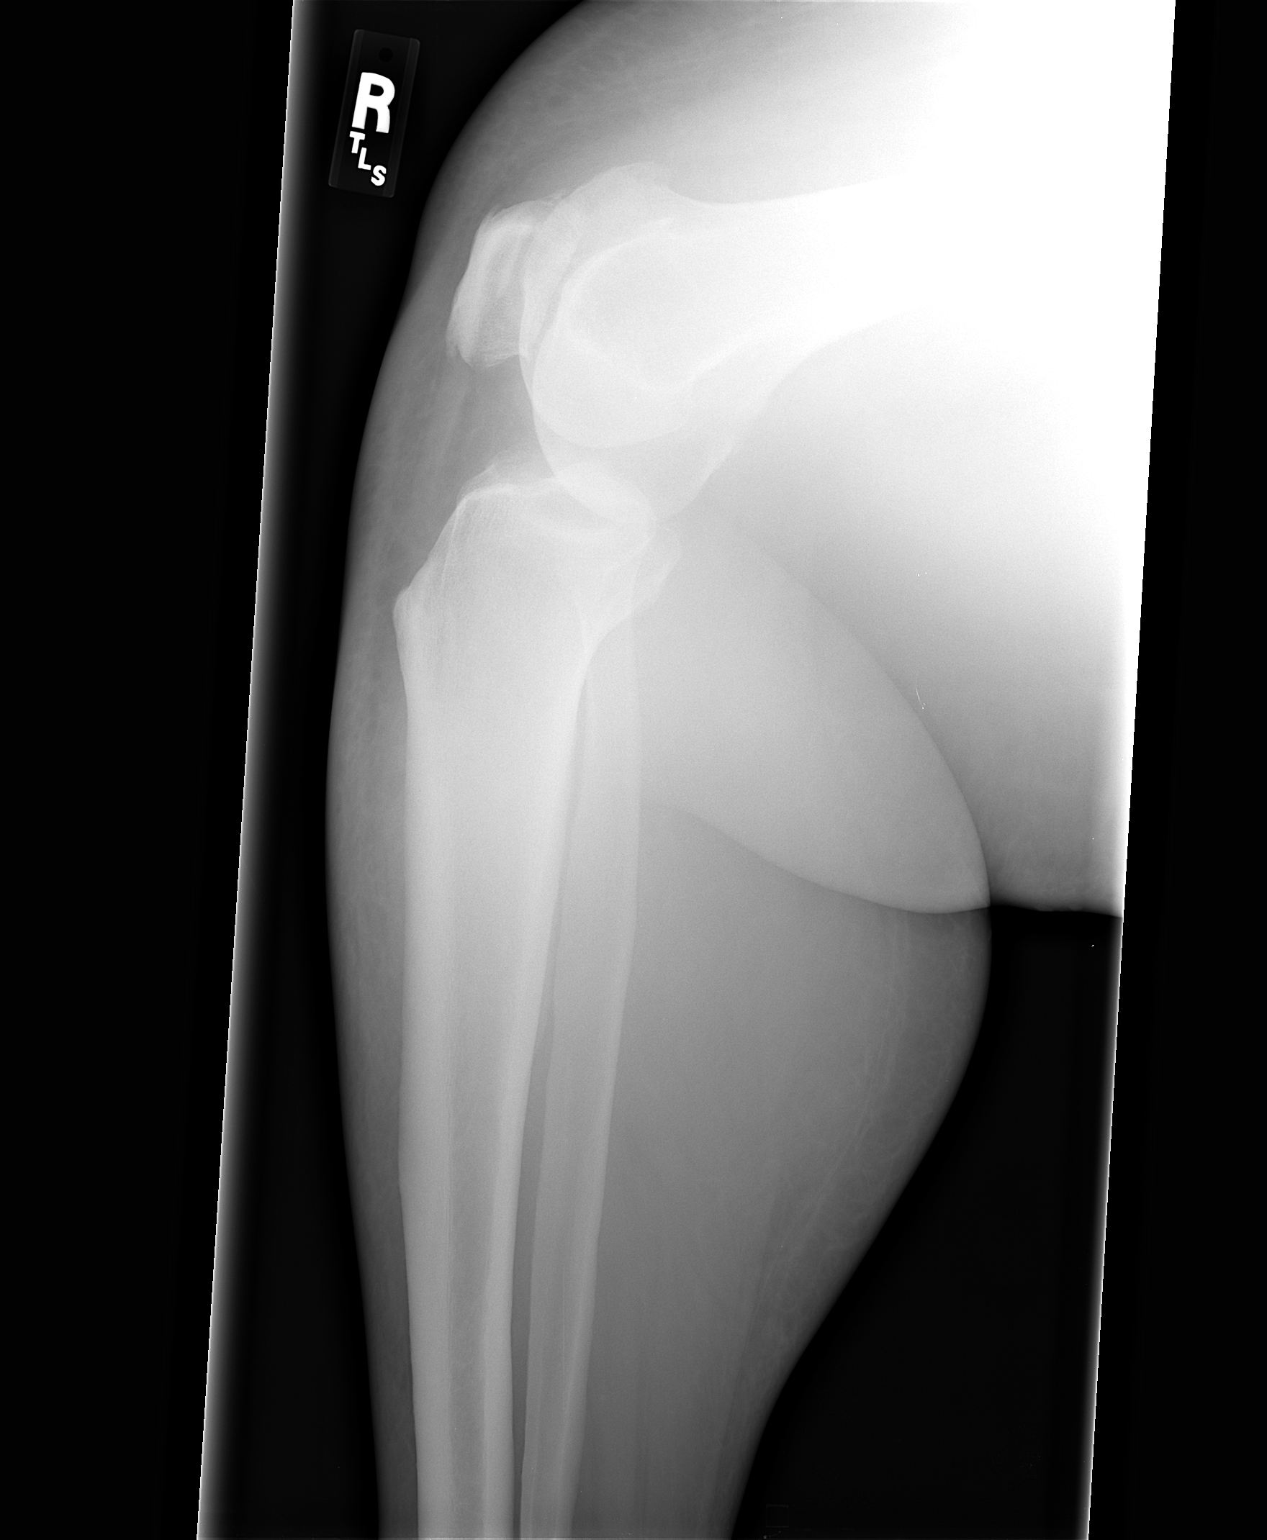

[2 of 2 positions shown; findings below may reference images not displayed]

FINDINGS: No evidence of fracture or dislocation.  Mild
degenerative spurring is seen involving the tibial spines and
lateral compartment knee joint.
IMPRESSION: 1.  No acute findings.
2.  Mild knee osteoarthritis.

## 2012-10-24 IMAGING — CR DG FOOT COMPLETE 3+V*R*
3 series · 3 of 3 positions shown · non-contrast
Comparison: None.

CLINICAL DATA: Fall.  Foot injury and pain.  Morbid obesity.

RIGHT FOOT COMPLETE - 3+ VIEW

[view not recorded (1 of 3)]
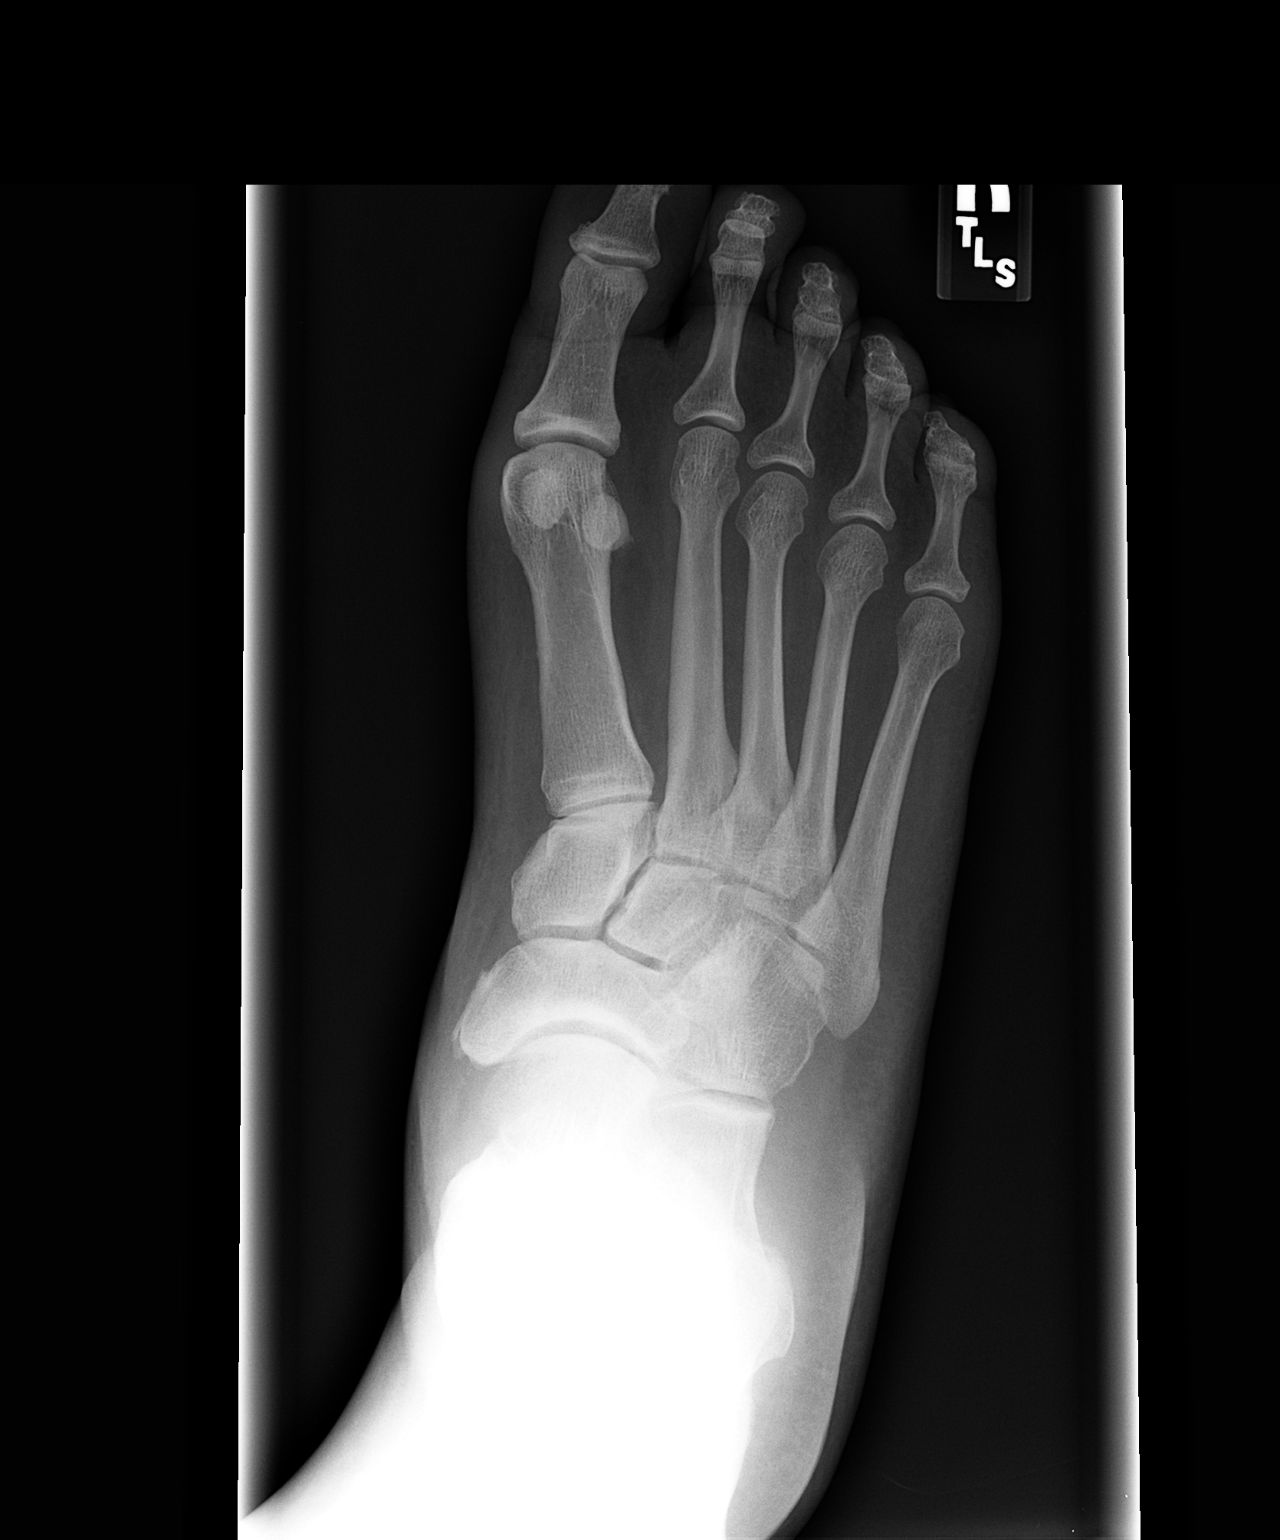

[view not recorded (2 of 3)]
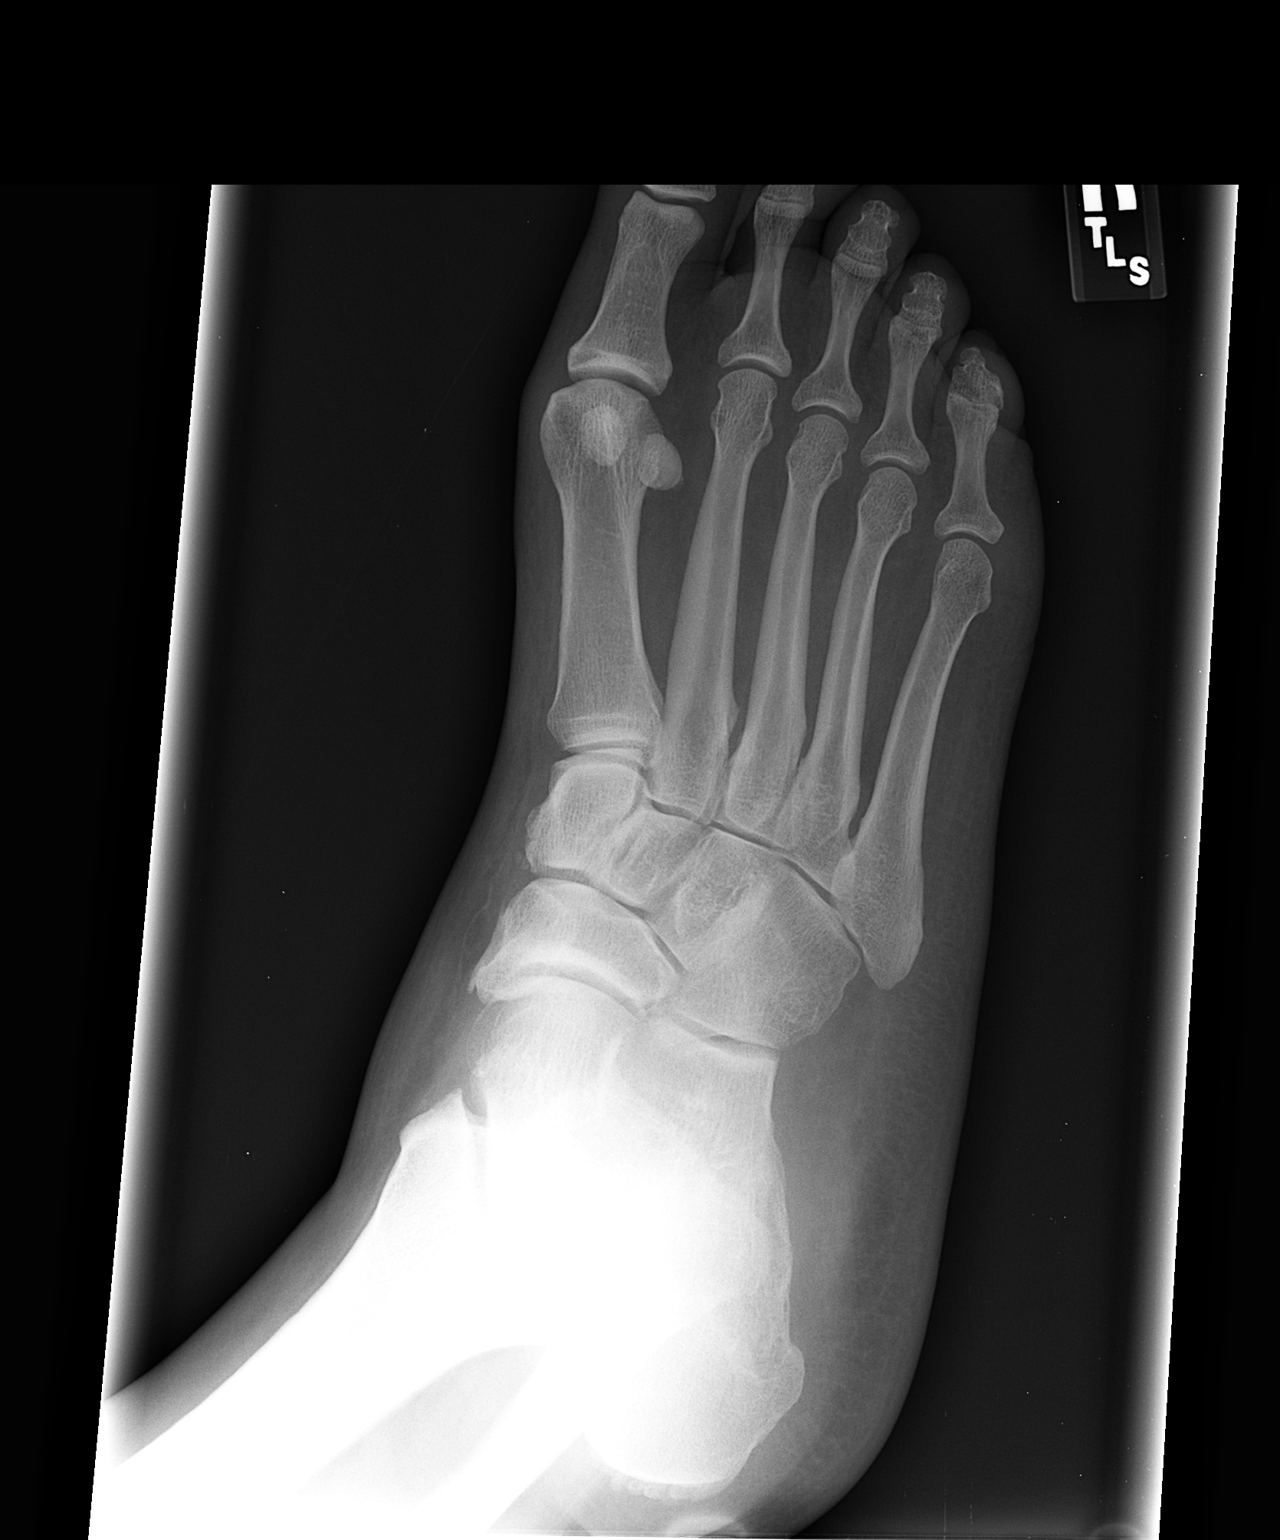

[view not recorded (3 of 3)]
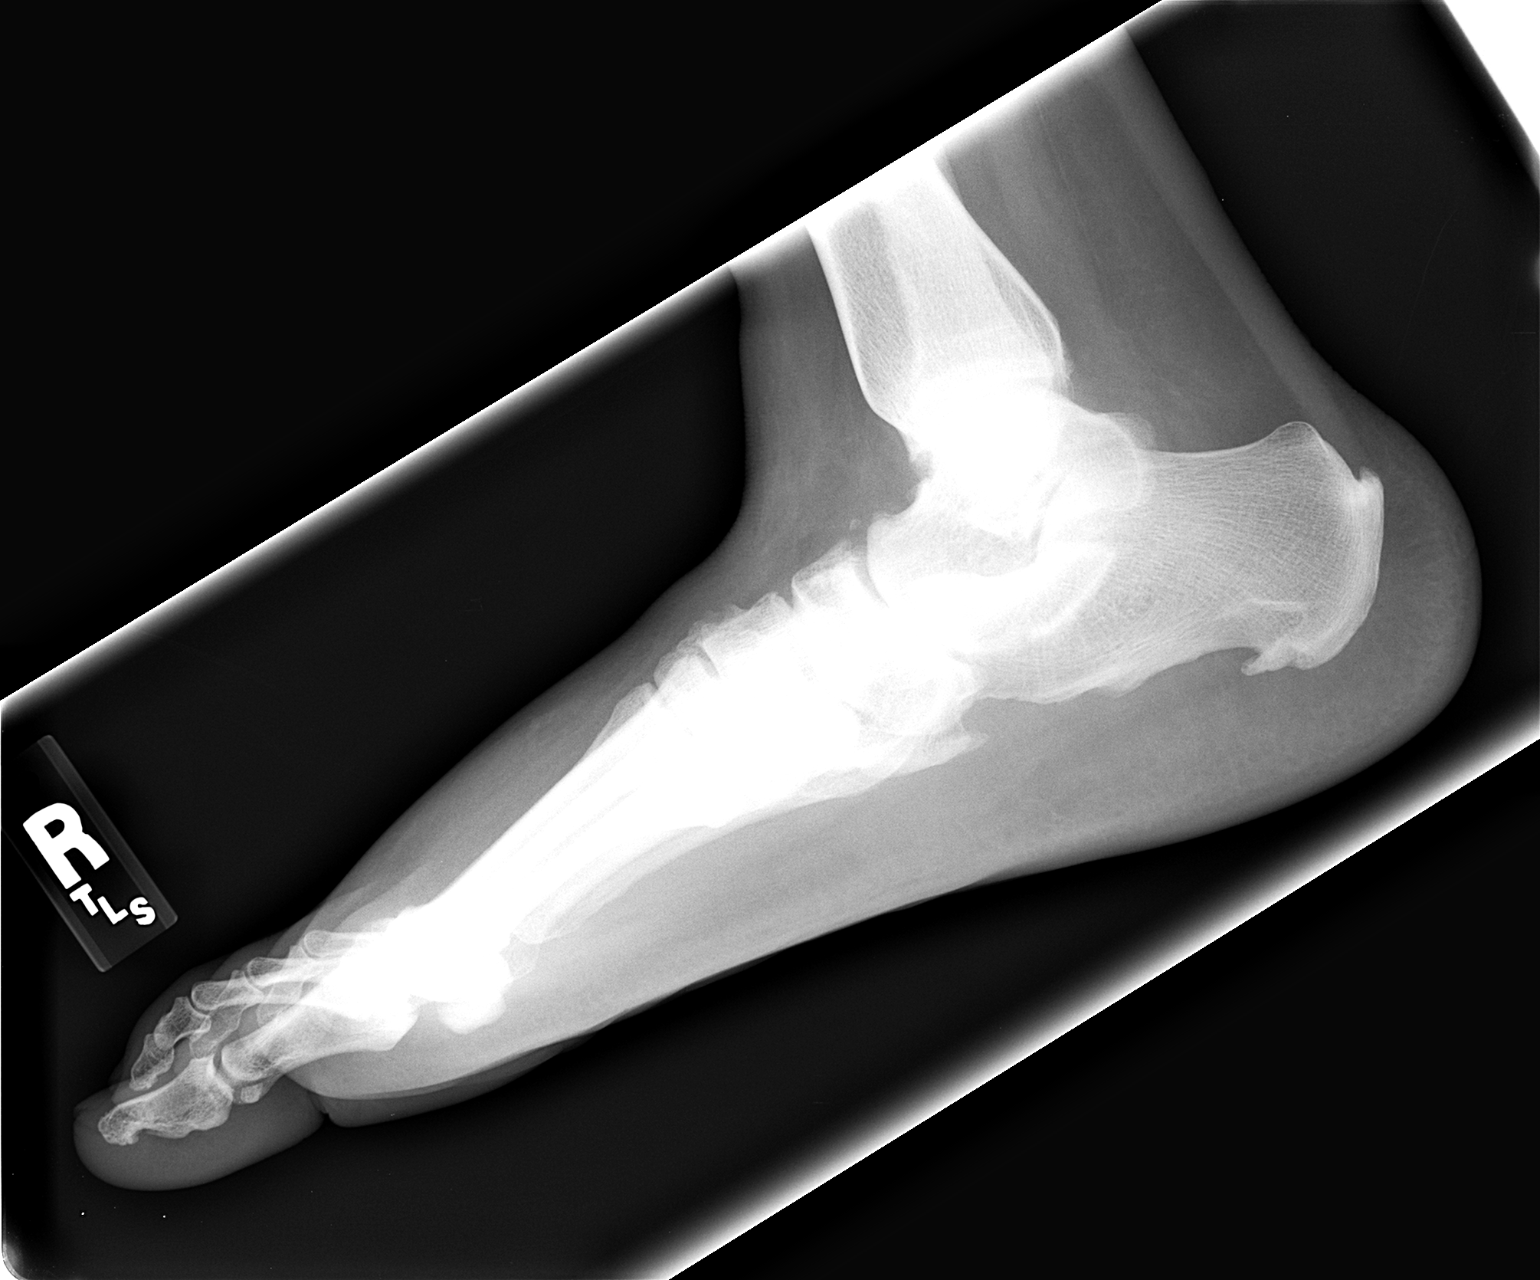

[3 of 3 positions shown; findings below may reference images not displayed]

FINDINGS: No evidence of fracture or dislocation.  Plantar and
dorsal calcaneal spurs are seen.  Mild to generous spurring is also
seen involving the right midfoot.
IMPRESSION: 1.  No acute findings.
2.  Calcaneal spurs.

## 2012-10-25 ENCOUNTER — Telehealth: Payer: Self-pay | Admitting: Emergency Medicine

## 2012-10-25 NOTE — Telephone Encounter (Signed)
Will fwd to Md.  Norabelle Kondo, Loralyn [place], [place]

## 2012-10-25 NOTE — Telephone Encounter (Signed)
Pt is calling because the medication that Dr. Darrick Grinder prescribed for her diabetes is too expensive and she would like to try something else she can afford. JW

## 2012-10-26 MED ORDER — AMARYL 2 MG PO TABS: 4.0000 mg | ORAL_TABLET | ORAL | Status: DC

## 2012-10-26 NOTE — Telephone Encounter (Signed)
I have increased her amaryl to 4mg  daily.  She should take 2 tablets every morning.  I sent a new prescription to the pharmacy.  She will need an appointment with me in 3 months.

## 2012-10-27 ENCOUNTER — Telehealth: Payer: Self-pay | Admitting: Emergency Medicine

## 2012-10-27 NOTE — Telephone Encounter (Signed)
Pt needs the generic version of amaryl so that she can afford this medication sent to the pharmacy. JW

## 2012-10-27 NOTE — Telephone Encounter (Signed)
Message below left on patients identifiable voicemail.  Linn Clavin, Loralyn Freshwater, Haivana Nakya

## 2012-10-27 NOTE — Telephone Encounter (Signed)
Will forward to Dr Bridgett Larsson

## 2012-10-28 MED ORDER — GLIMEPIRIDE 2 MG PO TABS: 4.0000 mg | ORAL_TABLET | Freq: Every day | ORAL | Status: DC

## 2012-10-28 NOTE — Telephone Encounter (Signed)
Generic prescription sent to [facility].

## 2012-12-16 ENCOUNTER — Ambulatory Visit (INDEPENDENT_AMBULATORY_CARE_PROVIDER_SITE_OTHER): Payer: BC Managed Care – PPO | Admitting: Emergency Medicine

## 2012-12-16 ENCOUNTER — Encounter: Payer: Self-pay | Admitting: Emergency Medicine

## 2012-12-16 VITALS — BP 139/86 | HR 81 | Ht 69.5 in | Wt 388.7 lb

## 2012-12-16 DIAGNOSIS — D509 Iron deficiency anemia, unspecified: Secondary | ICD-10-CM

## 2012-12-16 DIAGNOSIS — E119 Type 2 diabetes mellitus without complications: Secondary | ICD-10-CM

## 2012-12-16 DIAGNOSIS — I10 Essential (primary) hypertension: Secondary | ICD-10-CM

## 2012-12-16 DIAGNOSIS — F411 Generalized anxiety disorder: Secondary | ICD-10-CM

## 2012-12-16 DIAGNOSIS — Z23 Encounter for immunization: Secondary | ICD-10-CM

## 2012-12-16 DIAGNOSIS — E039 Hypothyroidism, unspecified: Secondary | ICD-10-CM

## 2012-12-16 LAB — POCT UA - MICROALBUMIN: Creatinine, POC: 200 mg/dL

## 2012-12-16 MED ORDER — LORAZEPAM 1 MG PO TABS: ORAL_TABLET | ORAL | Status: DC

## 2012-12-16 NOTE — Assessment & Plan Note (Signed)
Improved control with addition of amlodipine. No changes today.

## 2012-12-16 NOTE — Patient Instructions (Signed)
It was nice to see you!  I will take over prescribing the Ativan for now. I will see you every month to give you the prescription.   We are checking some blood work today.  I will call you if anything is wrong, otherwise you will get a letter with the results in the mail in 1-2 weeks.  Follow up in 1 month.

## 2012-12-16 NOTE — Progress Notes (Signed)
  Subjective:    Patient ID: [name], female    DOB: 07-15-1970, 42 y.o.   MRN: [id number]  HPI MARI MCDOW is here for med rec and f/u.  Medications She reports that [facility] will no longer prescribe Ativan for her.  This is causing her a lot of anxiety as she reports doing well for the first time in years.  She has not even thought about suicide for over 1 month.  She currently uses Ativan 2mg  at bedtime and 1mg  during the day for her anxiety.  She is also on clonidine and prozac.  Hypertension She is taking the amlodipine in addition to her carvedilol.  Denies any leg edema.  No chest pain or shortness of breath.  Hypothyroidism Compliant with the 239mcg dosing.  Reports that her cycle came on like normal this month.  Anemia Iron deficient.  Has been taking oral iron on most days for the last 6 months.  I have reviewed and updated the following as appropriate: allergies and current medications SHx: never smoker  Review of Systems See HPI    Objective:   Physical Exam BP 139/86  Pulse 81  Ht 5' 9.5" (1.765 m)  Wt 388 lb 11.2 oz (176.313 kg)  BMI 56.6 kg/m2  LMP 11/25/2012 Gen: alert, cooperative, NAD, appears calmer than I have seen her previously HEENT: AT/[place], sclera white, MMM Neck: supple CV: RRR, no murmurs Pulm: CTAB, no wheezes or rales Ext: no edema      Assessment & Plan:

## 2012-12-16 NOTE — Assessment & Plan Note (Signed)
>>  ASSESSMENT AND PLAN FOR ANXIETY WRITTEN ON 12/16/2012  4:27 PM BY HONIG, Finis Bud, MD  Doing well on her current medications. As such, I will provide Ativan 1mg  #90 tablets for the month (her current dose). Discussed rules and policies around controlled substance prescriptions. Ideally, will be able to taper dose over time. Follow up in 1 month.  Will need to sign contract at that time.

## 2012-12-16 NOTE — Assessment & Plan Note (Signed)
Recheck TSH today.  

## 2012-12-16 NOTE — Assessment & Plan Note (Signed)
Taking iron most days. Will recheck cbc today.

## 2012-12-16 NOTE — Assessment & Plan Note (Signed)
Doing well on her current medications. As such, I will provide Ativan 1mg  #90 tablets for the month (her current dose). Discussed rules and policies around controlled substance prescriptions. Ideally, will be able to taper dose over time. Follow up in 1 month.  Will need to sign contract at that time.

## 2012-12-17 ENCOUNTER — Telehealth: Payer: Self-pay | Admitting: Emergency Medicine

## 2012-12-17 DIAGNOSIS — R350 Frequency of micturition: Secondary | ICD-10-CM

## 2012-12-17 DIAGNOSIS — E1165 Type 2 diabetes mellitus with hyperglycemia: Secondary | ICD-10-CM

## 2012-12-17 DIAGNOSIS — I10 Essential (primary) hypertension: Secondary | ICD-10-CM

## 2012-12-17 DIAGNOSIS — F411 Generalized anxiety disorder: Secondary | ICD-10-CM

## 2012-12-17 DIAGNOSIS — E039 Hypothyroidism, unspecified: Secondary | ICD-10-CM

## 2012-12-17 LAB — CBC
HCT: 28.2 % — ABNORMAL LOW (ref 36.0–46.0)
Hemoglobin: 9 g/dL — ABNORMAL LOW (ref 12.0–15.0)
MCH: 25.6 pg — ABNORMAL LOW (ref 26.0–34.0)
MCHC: 31.9 g/dL (ref 30.0–36.0)
RBC: 3.51 MIL/uL — ABNORMAL LOW (ref 3.87–5.11)
RDW: 15.4 % (ref 11.5–15.5)

## 2012-12-17 MED ORDER — LEVOTHYROXINE SODIUM 300 MCG PO TABS: 300.0000 ug | ORAL_TABLET | Freq: Every day | ORAL | Status: DC

## 2012-12-17 NOTE — Telephone Encounter (Signed)
Called and left message for patient regarding lab results.  Blood counts are stable.  TSH is still elevated.  Will increase synthroid to 336mcg daily.  New prescription with appropriate dose sent to her pharmacy.  She is to call with any questions.  Reminded to f/u in 1 month.

## 2012-12-20 ENCOUNTER — Encounter: Payer: Self-pay | Admitting: Family Medicine

## 2012-12-20 ENCOUNTER — Ambulatory Visit (INDEPENDENT_AMBULATORY_CARE_PROVIDER_SITE_OTHER): Payer: BC Managed Care – PPO | Admitting: Family Medicine

## 2012-12-20 VITALS — BP 141/87 | HR 89 | Temp 99.1°F | Ht 70.0 in | Wt 385.1 lb

## 2012-12-20 DIAGNOSIS — B9789 Other viral agents as the cause of diseases classified elsewhere: Secondary | ICD-10-CM

## 2012-12-20 NOTE — Progress Notes (Signed)
Family Medicine Office Visit Note   Subjective:   Patient ID: [name], female  DOB: [birth year], 42 y.o.. MRN: [id number]   Pt that comes today for same day appointment complaining of respiratory symptoms for one day. She reports starting to have sore throat, runny nose, general malaise, and discomfort of her right ear. She also reports subjective fever and chills at night. She works in a [employer] school, but denies any children under her care there that have been sick lately. Denies history of travel or in contact with people who have traveled. Denies nausea vomiting or diarrhea.   Review of Systems:  Per HPI  Objective:   Physical Exam: Gen: Morbidly obese, well-appearing, NAD HEENT: Moist mucous membranes, mild clear rhinorrhea. Erythematous oropharynx without exudates. Neck with no adenopathies, supple.  Ears with normal TM, no bulging, no drainage.  CV: Regular rate and rhythm, no murmurs rubs or gallops PULM: Clear to auscultation bilaterally. No wheezes/rales/rhonchi EXT: No edema Neuro: Alert and oriented x3. No focalization  Assessment & Plan:

## 2012-12-20 NOTE — Patient Instructions (Signed)
Viral Infections A virus is a type of germ. Viruses can cause:  Minor sore throats.  Aches and pains.  Headaches.  Runny nose.  Rashes.  Watery eyes.  Tiredness.  Coughs.  Loss of appetite.  Feeling sick to your stomach (nausea).  Throwing up (vomiting).  Watery poop (diarrhea). HOME CARE   Only take medicines as told by your doctor.  Drink enough water and fluids to keep your pee (urine) clear or pale yellow. Sports drinks are a good choice.  Get plenty of rest and eat healthy. Soups and broths with crackers or rice are fine. GET HELP RIGHT AWAY IF:   You have a very bad headache.  You have shortness of breath.  You have chest pain or neck pain.  You have an unusual rash.  You cannot stop throwing up.  You have watery poop that does not stop.  You cannot keep fluids down.  You have a temperature by mouth above 102 F (38.9 C), not controlled by medicine. MAKE SURE YOU:   Understand these instructions.  Will watch this condition.  Will get help right away if you are not doing well or get worse. Document Released: 02/07/2008 Document Revised: 05/19/2011 Document Reviewed: 07/02/2010 [facility] Patient Information 2014 [place], [place].

## 2012-12-20 NOTE — Assessment & Plan Note (Addendum)
Symptomatic treatment. Letter for work sent.  Discussed signs of worsening condition that should prompt re-evaluation. Followup as needed.

## 2013-01-20 ENCOUNTER — Ambulatory Visit (INDEPENDENT_AMBULATORY_CARE_PROVIDER_SITE_OTHER): Payer: BC Managed Care – PPO | Admitting: Emergency Medicine

## 2013-01-20 VITALS — BP 141/102 | HR 85 | Temp 98.7°F | Ht 70.0 in | Wt 389.0 lb

## 2013-01-20 DIAGNOSIS — E039 Hypothyroidism, unspecified: Secondary | ICD-10-CM

## 2013-01-20 DIAGNOSIS — F411 Generalized anxiety disorder: Secondary | ICD-10-CM

## 2013-01-20 MED ORDER — LORAZEPAM 1 MG PO TABS: ORAL_TABLET | ORAL | Status: DC

## 2013-01-20 NOTE — Progress Notes (Signed)
  Subjective:    Patient ID: [name], female    DOB: 09-21-1970, 42 y.o.   MRN: [id number]  HPI [name] is here for ativan refill.  She takes ativan for her anxiety.  This has been well controlled for quite some time.  [facility] stopped prescribing the ativan a month or so ago and this caused her significant distress.  I have taken over prescribing it for her.  She reports doing very well.  Take 1mg  in the morning and 2mg  at bedtime.  No panic attacks.  She did have a day where she was more depressed, but rebounded quickly.  The holidays tend to be a difficult time for her since her mother died 5 years ago.  I have reviewed and updated the following as appropriate: allergies and current medications SHx: never smoker  Review of Systems See HPI    Objective:   Physical Exam BP 141/102  Pulse 85  Temp(Src) 98.7 F (37.1 C) (Oral)  Ht 5\' 10"  (1.778 m)  Wt 389 lb (176.449 kg)  BMI 55.82 kg/m2  LMP 11/15/2012 Gen: alert, cooperative, NAD HEENT: AT/[place], sclera white, MMM Neck: supple CV: RRR, no murmurs Pulm: CTAB, no wheezes or rales     Assessment & Plan:

## 2013-01-20 NOTE — Assessment & Plan Note (Signed)
Has not picked increased dose yet. Will pick up soon. Recheck TSH 6 weeks after starting higher dose.

## 2013-01-20 NOTE — Assessment & Plan Note (Signed)
>>  ASSESSMENT AND PLAN FOR ANXIETY WRITTEN ON 01/20/2013  4:46 PM BY HONIG, Finis Bud, MD  Doing well on the ativan. Controlled substance contract reviewed and signed today. Refilled Ativan 1mg  #90 tablets. Follow up in 1 month.

## 2013-01-20 NOTE — Assessment & Plan Note (Addendum)
Doing well on the ativan. Controlled substance contract reviewed and signed today. Refilled Ativan 1mg  #90 tablets. Follow up in 1 month.

## 2013-01-20 NOTE — Patient Instructions (Signed)
It was nice to see you!  I have refilled your Ativan and you signed a controlled substance contract with our office.  Follow up in 1 month.

## 2013-01-24 ENCOUNTER — Ambulatory Visit (INDEPENDENT_AMBULATORY_CARE_PROVIDER_SITE_OTHER): Payer: BC Managed Care – PPO | Admitting: *Deleted

## 2013-01-24 DIAGNOSIS — Z111 Encounter for screening for respiratory tuberculosis: Secondary | ICD-10-CM

## 2013-01-24 NOTE — Progress Notes (Signed)
Tuberculin skin test applied to left ventral forearm. Explained how to read the test, measuring induration not just erythema; she will come into office in 48-72 hours to have test read.

## 2013-01-26 ENCOUNTER — Encounter: Payer: Self-pay | Admitting: *Deleted

## 2013-01-26 LAB — TB SKIN TEST
Induration: 0 mm
TB Skin Test: NEGATIVE

## 2013-02-16 ENCOUNTER — Ambulatory Visit (INDEPENDENT_AMBULATORY_CARE_PROVIDER_SITE_OTHER): Payer: BC Managed Care – PPO | Admitting: Emergency Medicine

## 2013-02-16 ENCOUNTER — Encounter: Payer: Self-pay | Admitting: Emergency Medicine

## 2013-02-16 VITALS — BP 134/84 | HR 90 | Ht 70.0 in | Wt 387.0 lb

## 2013-02-16 DIAGNOSIS — M25551 Pain in right hip: Secondary | ICD-10-CM

## 2013-02-16 DIAGNOSIS — F411 Generalized anxiety disorder: Secondary | ICD-10-CM

## 2013-02-16 DIAGNOSIS — IMO0002 Reserved for concepts with insufficient information to code with codable children: Secondary | ICD-10-CM

## 2013-02-16 DIAGNOSIS — E1165 Type 2 diabetes mellitus with hyperglycemia: Secondary | ICD-10-CM

## 2013-02-16 DIAGNOSIS — M25559 Pain in unspecified hip: Secondary | ICD-10-CM

## 2013-02-16 LAB — POCT GLYCOSYLATED HEMOGLOBIN (HGB A1C): Hemoglobin A1C: 6.9

## 2013-02-16 LAB — LDL CHOLESTEROL, DIRECT: Direct LDL: 108 mg/dL — ABNORMAL HIGH

## 2013-02-16 MED ORDER — MELOXICAM 7.5 MG PO TABS: 7.5000 mg | ORAL_TABLET | Freq: Every day | ORAL | Status: DC

## 2013-02-16 MED ORDER — LORAZEPAM 1 MG PO TABS: ORAL_TABLET | ORAL | Status: DC

## 2013-02-16 NOTE — Assessment & Plan Note (Signed)
Stable. Experiencing some "holiday blues" but doing well overall. Refilled ativan #90.  F/u in 1 month.

## 2013-02-16 NOTE — Progress Notes (Signed)
   Subjective:    Patient ID: [name], female    DOB: May 21, 1970, 42 y.o.   MRN: [id number]  HPI [name] is here for med refill and right leg pain.  Med refill She is on ativan for anxiety.  She reports that her anxiety is well controlled. She is having some "holiday blues."  She continues to be followed at [facility].  Right leg pain Pain is located in the posterior thigh and groin.  Started about 2 weeks ago; denies any trauma or injury.  No new activities. Worse with hip flexion, like when going to sit down.  Has tried tylenol, aleve and goody powder at home without improvement.  Does not radiate.  No weakness, numbness or tingling.  No pain in back or buttock. No bowel/bladder incontinence.  I have reviewed and updated the following as appropriate: allergies and current medications SHx: non smoker  Health Maintenance: will address diabetic maintenance at f/u in 2 weeks   Review of Systems See HPI    Objective:   Physical Exam BP 134/84  Pulse 90  Ht 5\' 10"  (1.778 m)  Wt 387 lb (175.542 kg)  BMI 55.53 kg/m2  LMP 11/15/2012 Gen: alert, cooperative, NAD, morbidly obese Right hip: no obvious swelling; tender in groin and lateral posterior thigh, no pain over greater trochanter; full ROM, pain with passive internal and external rotation; pain with resisted adduction and knee flexion; 5/5 strength throughout right lower extremity; 2+ PT pulse     Assessment & Plan:

## 2013-02-16 NOTE — Patient Instructions (Signed)
It was nice to see you!  We are going to evaluate your right hip. - please go to [facility] in the next week to get an x-ray of the hip. - take meloxicam daily for 1 week then as needed.  DO NOT TAKE with Ibuprofen, aleve, advil, naprosyn, motrin - take tylenol extra strength 2 tablets 3 times a day  Follow up in 2 weeks.  We will talk about your labs and diabetes then as well.

## 2013-02-16 NOTE — Assessment & Plan Note (Signed)
>>  ASSESSMENT AND PLAN FOR ANXIETY WRITTEN ON 02/16/2013  4:07 PM BY HONIG, Finis Bud, MD  Stable. Experiencing some "holiday blues" but doing well overall. Refilled ativan #90.  F/u in 1 month.

## 2013-02-16 NOTE — Assessment & Plan Note (Signed)
Pain in groin and posterior thigh. Exam most consistent with hip OA, but could also be AVN.  Doubt nerve related.  Muscle strain/tear unlikely as well. Will check plain films. Start meloxicam 7.5mg  daily x1 week then prn - instructed to not take any OTC NSAIDs as she has been. Tylenol 1g TID. F/u in 2 weeks.

## 2013-03-23 ENCOUNTER — Encounter: Payer: Self-pay | Admitting: Emergency Medicine

## 2013-03-23 ENCOUNTER — Ambulatory Visit (INDEPENDENT_AMBULATORY_CARE_PROVIDER_SITE_OTHER): Payer: BC Managed Care – PPO | Admitting: Emergency Medicine

## 2013-03-23 VITALS — BP 138/90 | HR 99 | Temp 99.5°F | Ht 70.0 in | Wt 380.0 lb

## 2013-03-23 DIAGNOSIS — E669 Obesity, unspecified: Secondary | ICD-10-CM

## 2013-03-23 DIAGNOSIS — F339 Major depressive disorder, recurrent, unspecified: Secondary | ICD-10-CM

## 2013-03-23 DIAGNOSIS — F411 Generalized anxiety disorder: Secondary | ICD-10-CM

## 2013-03-23 MED ORDER — LORAZEPAM 1 MG PO TABS: ORAL_TABLET | ORAL | Status: DC

## 2013-03-23 NOTE — Assessment & Plan Note (Signed)
Recent admission to [facility] crisis center for tardive dyskinesia and SI/auditory hallucinations. Doing much better now. Haldol changed to Thorazine - updated in med list.

## 2013-03-23 NOTE — Assessment & Plan Note (Signed)
>>  ASSESSMENT AND PLAN FOR ANXIETY WRITTEN ON 03/23/2013  4:01 PM BY HONIG, Finis Bud, MD  Stable currently. Refilled Ativan 1mg  #90 for 2 months. F/u in 2 month for refill.

## 2013-03-23 NOTE — Patient Instructions (Signed)
It was nice to see you! I'm glad you are doing better. Keep up the healthy choices!!  I have given you 2 prescriptions for Ativan. Your pharmacy might hold on to them for you. Otherwise, keep track of them.  If they are lost or stolen, I will not be able to replace them unless a police report has been filed.  Follow up in 2 months.

## 2013-03-23 NOTE — Assessment & Plan Note (Signed)
Stable currently. Refilled Ativan 1mg  #90 for 2 months. F/u in 2 month for refill.

## 2013-03-23 NOTE — Progress Notes (Signed)
   Subjective:    Patient ID: [name], female    DOB: 1971/01/29, 43 y.o.   MRN: [id number]  HPI NIJAY MCAMIS is here for med refill.  She is here today to refill her Ativan which she takes for anxiety.  She reports that last week she developed a facial tick - tardive dyskinesia - and stopped her haldol.  Several days after stopping the Haldol, she developed auditory halluciations and SI.  She followed her crisis plan and was admitted to [facility] crisis center on Monday.  She was started on Thorazine, she stabilized and was discharged on Tuesday.  Today, she denies any SI and states she is doing much better.  She continues to use the Ativan 1mg  in the morning and 2mg  at bedtime.  She also thinks she has lost some weight.  She states she is making healthier choices with meals and avoiding sodas.  I have reviewed and updated the following as appropriate: allergies and current medications SHx: non smoker  Review of Systems See HPI    Objective:   Physical Exam BP 138/90  Pulse 99  Temp(Src) 99.5 F (37.5 C) (Oral)  Ht 5\' 10"  (1.778 m)  Wt 380 lb (172.367 kg)  BMI 54.52 kg/m2  LMP 11/08/2012 Gen: alert, cooperative, NAD Psych: mood and affect congruent and normal      Assessment & Plan:

## 2013-03-23 NOTE — Assessment & Plan Note (Signed)
Weight is down 7lbs. She is focusing on making healthier choices during meals and snacks. Provided encouragement.

## 2013-05-13 ENCOUNTER — Ambulatory Visit (INDEPENDENT_AMBULATORY_CARE_PROVIDER_SITE_OTHER): Payer: BC Managed Care – PPO | Admitting: Emergency Medicine

## 2013-05-13 ENCOUNTER — Encounter: Payer: Self-pay | Admitting: Emergency Medicine

## 2013-05-13 VITALS — BP 137/89 | HR 93 | Temp 98.9°F | Ht 70.0 in | Wt 372.0 lb

## 2013-05-13 DIAGNOSIS — E669 Obesity, unspecified: Secondary | ICD-10-CM

## 2013-05-13 DIAGNOSIS — F411 Generalized anxiety disorder: Secondary | ICD-10-CM

## 2013-05-13 MED ORDER — LORAZEPAM 1 MG PO TABS: ORAL_TABLET | ORAL | Status: DC

## 2013-05-13 NOTE — Assessment & Plan Note (Signed)
Weight down another 8lbs. Provided encouragement.

## 2013-05-13 NOTE — Progress Notes (Signed)
   Subjective:    Patient ID: [name], female    DOB: February 24, 1971, 43 y.o.   MRN: [id number]  HPI [name] is here for med refill.  She is taking Ativan for her anxiety and depression.  Her dose has been stable for over 1 year.  She reports doing very well.  She recently had a stressor with a health concern for her son and was able to deal with it pretty well.    Current Outpatient Prescriptions on File Prior to Visit  Medication Sig Dispense Refill  . amLODipine (NORVASC) 5 MG tablet Take 1 tablet (5 mg total) by mouth daily.  90 tablet  3  . Blood Glucose Monitoring Suppl (ONE TOUCH ULTRA SYSTEM KIT) W/DEVICE KIT Check CBG's once daily and prn.  Please substitute glucometer and lancets per formulary.  1 each  0  . busPIRone (BUSPAR) 10 MG tablet Take 10 mg by mouth 2 (two) times daily.      . carvedilol (COREG) 25 MG tablet Take 1 tablet (25 mg total) by mouth 2 (two) times daily.  60 tablet  2  . chlorproMAZINE (THORAZINE) 100 MG tablet Take 100 mg by mouth daily.      . cloNIDine (CATAPRES) 0.1 MG tablet Take 0.1 mg by mouth as needed (anxiety).      Marland Kitchen diphenhydrAMINE (BENADRYL) 25 mg capsule Take 25 mg by mouth at bedtime as needed. sleep      . ferrous sulfate 220 (44 FE) MG/5ML solution Take 5 mLs (220 mg total) by mouth daily.  150 mL  3  . FLUoxetine (PROZAC) 40 MG capsule Take 80 mg by mouth daily.       Marland Kitchen gabapentin (NEURONTIN) 100 MG capsule Take 800 mg by mouth 3 (three) times daily.       Marland Kitchen glimepiride (AMARYL) 2 MG tablet Take 2 tablets (4 mg total) by mouth daily before breakfast.  60 tablet  3  . glucose blood (ONE TOUCH ULTRA TEST) test strip Check sugar every morning and 2 hours after your biggest meal of the day  100 each  12  . Lancets (ACCU-CHEK MULTICLIX) lancets Use as instructed  100 each  12  . levothyroxine (SYNTHROID, LEVOTHROID) 300 MCG tablet Take 1 tablet (300 mcg total) by mouth daily.  30 tablet  2  . meloxicam (MOBIC) 7.5 MG tablet Take 1 tablet  (7.5 mg total) by mouth daily. Daily for 1 week, then as needed  30 tablet  0  . traZODone (DESYREL) 100 MG tablet        No current facility-administered medications on file prior to visit.    I have reviewed and updated the following as appropriate: allergies and current medications SHx: non smoker  Health Maintenance: pap due in August 2015  Review of Systems See HPI    Objective:   Physical Exam BP 137/89  Pulse 93  Temp(Src) 98.9 F (37.2 C) (Oral)  Ht _0  (1.778 m)  Wt 372 lb (168.738 kg)  BMI 53.38 kg/m2  LMP 05/13/2013 Gen: alert, cooperative, NAD HEENT: AT/[place], sclera white, MMM Neck: supple CV: RRR, no murmurs Pulm: CTAB, no wheezes or rales     Assessment & Plan:

## 2013-05-13 NOTE — Assessment & Plan Note (Signed)
>>  ASSESSMENT AND PLAN FOR ANXIETY WRITTEN ON 05/13/2013  4:23 PM BY HONIG, Finis Bud, MD  Doing very well. Thinking about joining a group therapy session through [facility]. Refilled Ativan #90 x3 months. F/u in 3 months.

## 2013-05-13 NOTE — Assessment & Plan Note (Signed)
Doing very well. Thinking about joining a group therapy session through [facility]. Refilled Ativan #90 x3 months. F/u in 3 months.

## 2013-05-13 NOTE — Patient Instructions (Signed)
Happy Birthday!  You're doing great!  Your weight is down another 8lbs!  I will see you back in June for your med refills.  Come back sooner if anything else comes up.

## 2013-05-23 ENCOUNTER — Telehealth: Payer: Self-pay | Admitting: Family Medicine

## 2013-05-23 NOTE — Telephone Encounter (Signed)
After hours call, Returned call but no answer. Left VM  Laroy Apple, MD [facility] Resident, PGY-2 05/23/2013, 8:29 PM

## 2013-08-11 ENCOUNTER — Ambulatory Visit (INDEPENDENT_AMBULATORY_CARE_PROVIDER_SITE_OTHER): Payer: BC Managed Care – PPO | Admitting: Emergency Medicine

## 2013-08-11 ENCOUNTER — Encounter: Payer: Self-pay | Admitting: Emergency Medicine

## 2013-08-11 VITALS — BP 145/80 | HR 61 | Temp 99.0°F | Wt 371.0 lb

## 2013-08-11 DIAGNOSIS — IMO0002 Reserved for concepts with insufficient information to code with codable children: Secondary | ICD-10-CM

## 2013-08-11 DIAGNOSIS — F339 Major depressive disorder, recurrent, unspecified: Secondary | ICD-10-CM

## 2013-08-11 DIAGNOSIS — E118 Type 2 diabetes mellitus with unspecified complications: Secondary | ICD-10-CM

## 2013-08-11 DIAGNOSIS — I10 Essential (primary) hypertension: Secondary | ICD-10-CM

## 2013-08-11 DIAGNOSIS — E039 Hypothyroidism, unspecified: Secondary | ICD-10-CM

## 2013-08-11 DIAGNOSIS — F411 Generalized anxiety disorder: Secondary | ICD-10-CM

## 2013-08-11 DIAGNOSIS — R350 Frequency of micturition: Secondary | ICD-10-CM

## 2013-08-11 DIAGNOSIS — E1165 Type 2 diabetes mellitus with hyperglycemia: Secondary | ICD-10-CM

## 2013-08-11 LAB — POCT GLYCOSYLATED HEMOGLOBIN (HGB A1C): Hemoglobin A1C: 7.3

## 2013-08-11 MED ORDER — LEVOTHYROXINE SODIUM 300 MCG PO TABS: 300.0000 ug | ORAL_TABLET | Freq: Every day | ORAL | Status: DC

## 2013-08-11 MED ORDER — LORAZEPAM 1 MG PO TABS: ORAL_TABLET | ORAL | Status: DC

## 2013-08-11 MED ORDER — GLIMEPIRIDE 2 MG PO TABS
4.0000 mg | ORAL_TABLET | Freq: Every day | ORAL | Status: DC
Start: 2013-08-11 — End: 2014-10-09

## 2013-08-11 MED ORDER — AMLODIPINE BESYLATE 5 MG PO TABS: 5.0000 mg | ORAL_TABLET | Freq: Every day | ORAL | Status: DC

## 2013-08-11 NOTE — Patient Instructions (Signed)
It was nice to see you! I'm sorry you're having a down day.  I would like for you to go to [facility] next week for a walk-in appointment. Keep going to your Thursday night group.  Get a sun lamp from [place]. Watch "funny people at [facility]."  Followup in July to meet your new doctor.

## 2013-08-11 NOTE — Assessment & Plan Note (Signed)
>>  ASSESSMENT AND PLAN FOR ANXIETY WRITTEN ON 08/11/2013  4:52 PM BY HONIG, Finis Bud, MD  This is currently doing well. Ativan 1 mg #90 x3 prescriptions.

## 2013-08-11 NOTE — Assessment & Plan Note (Signed)
This is currently doing well. Ativan 1 mg #90 x3 prescriptions.

## 2013-08-11 NOTE — Assessment & Plan Note (Signed)
She is currently experiencing some worsening depression. Denies any suicidal thoughts. Does have a plan for if she develops suicidal thoughts. I have recommended that she go for a walk in appointment at [facility] within the next week. She will continue to go to her Thursday night group therapy sessions. I also recommended getting a sun lamp from [employer] to see if that will help. She will followup here if she cannot be seen at [facility] in the next week or 2.

## 2013-08-11 NOTE — Progress Notes (Signed)
   Subjective:    Patient ID: [name], female    DOB: 06/25/1970, 43 y.o.   MRN: [id number]  HPI YENESIS EVEN is here for medication refill and depression.  Medication refill She is on Ativan 2 mg in the evening and 1 mg in the morning as needed for anxiety. She states her anxiety is well controlled at this time.  Depression She is followed at [facility] for her depression. She was seen there are last month and was doing well at that time. Since Mother's Day she states she has been struggling more with depression. Yesterday and today has been particularly difficult days for her. Symptoms are primarily feeling sad, low energy, crying. She denies any thoughts of hurting herself or others. She has had suicidal ideation in the past, and states her plan is to call a crisis line if she starts having suicidal thoughts. She is attending a Thursday night group therapy session. She states this is beneficial for her. She does not have an appointment with [facility] again until August.  Current Outpatient Prescriptions on File Prior to Visit  Medication Sig Dispense Refill  . Blood Glucose Monitoring Suppl (ONE TOUCH ULTRA SYSTEM KIT) W/DEVICE KIT Check CBG's once daily and prn.  Please substitute glucometer and lancets per formulary.  1 each  0  . busPIRone (BUSPAR) 10 MG tablet Take 10 mg by mouth 2 (two) times daily.      . carvedilol (COREG) 25 MG tablet Take 1 tablet (25 mg total) by mouth 2 (two) times daily.  60 tablet  2  . chlorproMAZINE (THORAZINE) 100 MG tablet Take 100 mg by mouth daily.      . cloNIDine (CATAPRES) 0.1 MG tablet Take 0.1 mg by mouth as needed (anxiety).      Marland Kitchen diphenhydrAMINE (BENADRYL) 25 mg capsule Take 25 mg by mouth at bedtime as needed. sleep      . ferrous sulfate 220 (44 FE) MG/5ML solution Take 5 mLs (220 mg total) by mouth daily.  150 mL  3  . FLUoxetine (PROZAC) 40 MG capsule Take 80 mg by mouth daily.       Marland Kitchen gabapentin (NEURONTIN) 100 MG capsule Take 800 mg by  mouth 3 (three) times daily.       Marland Kitchen glucose blood (ONE TOUCH ULTRA TEST) test strip Check sugar every morning and 2 hours after your biggest meal of the day  100 each  12  . Lancets (ACCU-CHEK MULTICLIX) lancets Use as instructed  100 each  12  . meloxicam (MOBIC) 7.5 MG tablet Take 1 tablet (7.5 mg total) by mouth daily. Daily for 1 week, then as needed  30 tablet  0  . traZODone (DESYREL) 100 MG tablet        No current facility-administered medications on file prior to visit.    I have reviewed and updated the following as appropriate: allergies and current medications SHx: non smoker   Review of Systems See HPI    Objective:   Physical Exam BP 145/80  Pulse 61  Temp(Src) 99 F (37.2 C) (Oral)  Wt 371 lb (168.284 kg)  LMP 05/11/2013 Gen: alert, cooperative, intermittently tearful but able to smile and laugh      Assessment & Plan:  Followup in July to meet new doctor. Will likely need to discuss diabetes at that time. Her A1c today is 7.3%. We did not discuss this today given her issues with depression.

## 2013-11-25 ENCOUNTER — Ambulatory Visit (INDEPENDENT_AMBULATORY_CARE_PROVIDER_SITE_OTHER): Payer: BC Managed Care – PPO | Admitting: Family Medicine

## 2013-11-25 ENCOUNTER — Encounter: Payer: Self-pay | Admitting: Family Medicine

## 2013-11-25 VITALS — BP 141/82 | HR 85 | Temp 99.1°F | Ht 70.0 in | Wt 358.8 lb

## 2013-11-25 DIAGNOSIS — Z23 Encounter for immunization: Secondary | ICD-10-CM

## 2013-11-25 DIAGNOSIS — F411 Generalized anxiety disorder: Secondary | ICD-10-CM

## 2013-11-25 DIAGNOSIS — IMO0002 Reserved for concepts with insufficient information to code with codable children: Secondary | ICD-10-CM | POA: Diagnosis not present

## 2013-11-25 DIAGNOSIS — N946 Dysmenorrhea, unspecified: Secondary | ICD-10-CM | POA: Diagnosis not present

## 2013-11-25 DIAGNOSIS — E669 Obesity, unspecified: Secondary | ICD-10-CM | POA: Diagnosis not present

## 2013-11-25 DIAGNOSIS — E1165 Type 2 diabetes mellitus with hyperglycemia: Secondary | ICD-10-CM | POA: Diagnosis not present

## 2013-11-25 DIAGNOSIS — E118 Type 2 diabetes mellitus with unspecified complications: Principal | ICD-10-CM

## 2013-11-25 LAB — POCT GLYCOSYLATED HEMOGLOBIN (HGB A1C): Hemoglobin A1C: 6.4

## 2013-11-25 MED ORDER — LORAZEPAM 1 MG PO TABS: ORAL_TABLET | ORAL | Status: DC

## 2013-11-25 MED ORDER — LORAZEPAM 1 MG PO TABS: 1.0000 mg | ORAL_TABLET | Freq: Two times a day (BID) | ORAL | Status: DC | PRN

## 2013-11-25 NOTE — Assessment & Plan Note (Addendum)
Doing on current medication regimen. No SI/HI. Refill of Ativan given today. Will followup in 3 months.

## 2013-11-25 NOTE — Assessment & Plan Note (Signed)
Patient down 12 pounds from last visit. Encouraged her to keep up with exercise and eating well.

## 2013-11-25 NOTE — Patient Instructions (Addendum)
It was nice to meet you today!  Keep up the good work with weight loss and blood sugar control.  You can take ibuprofen 800mg  every 6 hours for menstrual pains.  Someone will call you to schedule the ultrasounds.  Make an appointment to get a retinal exam with [facility].  I will follow-up with you in 3 months, and touch base after the ultrasound, as we may need to refer to gynecology.  Best, Dr. Brita Romp (Dr. B)   Dysmenorrhea Menstrual cramps (dysmenorrhea) are caused by the muscles of the uterus tightening (contracting) during a menstrual period. For some women, this discomfort is merely bothersome. For others, dysmenorrhea can be severe enough to interfere with everyday activities for a few days each month. Primary dysmenorrhea is menstrual cramps that last a couple of days when you start having menstrual periods or soon after. This often begins after a teenager starts having her period. As a woman gets older or has a baby, the cramps will usually lessen or disappear. Secondary dysmenorrhea begins later in life, lasts longer, and the pain may be stronger than primary dysmenorrhea. The pain may start before the period and last a few days after the period.  CAUSES  Dysmenorrhea is usually caused by an underlying problem, such as:  The tissue lining the uterus grows outside of the uterus in other areas of the body (endometriosis).  The endometrial tissue, which normally lines the uterus, is found in or grows into the muscular walls of the uterus (adenomyosis).  The pelvic blood vessels are engorged with blood just before the menstrual period (pelvic congestive syndrome).  Overgrowth of cells (polyps) in the lining of the uterus or cervix.  Falling down of the uterus (prolapse) because of loose or stretched ligaments.  Depression.  Bladder problems, infection, or inflammation.  Problems with the intestine, a tumor, or irritable bowel syndrome.  Cancer of the female organs or  bladder.  A severely tipped uterus.  A very tight opening or closed cervix.  Noncancerous tumors of the uterus (fibroids).  Pelvic inflammatory disease (PID).  Pelvic scarring (adhesions) from a previous surgery.  Ovarian cyst.  An intrauterine device (IUD) used for birth control. RISK FACTORS You may be at greater risk of dysmenorrhea if:  You are younger than age 59.  You started puberty early.  You have irregular or heavy bleeding.  You have never given birth.  You have a family history of this problem.  You are a smoker. SIGNS AND SYMPTOMS   Cramping or throbbing pain in your lower abdomen.  Headaches.  Lower back pain.  Nausea or vomiting.  Diarrhea.  Sweating or dizziness.  Loose stools. DIAGNOSIS  A diagnosis is based on your history, symptoms, physical exam, diagnostic tests, or procedures. Diagnostic tests or procedures may include:  Blood tests.  Ultrasonography.  An examination of the lining of the uterus (dilation and curettage, D&C).  An examination inside your abdomen or pelvis with a scope (laparoscopy).  X-rays.  CT scan.  MRI.  An examination inside the bladder with a scope (cystoscopy).  An examination inside the intestine or stomach with a scope (colonoscopy, gastroscopy). TREATMENT  Treatment depends on the cause of the dysmenorrhea. Treatment may include:  Pain medicine prescribed by your health care provider.  Birth control pills or an IUD with progesterone hormone in it.  Hormone replacement therapy.  Nonsteroidal anti-inflammatory drugs (NSAIDs). These may help stop the production of prostaglandins.  Surgery to remove adhesions, endometriosis, ovarian cyst, or fibroids.  Removal of the uterus (hysterectomy).  Progesterone shots to stop the menstrual period.  Cutting the nerves on the sacrum that go to the female organs (presacral neurectomy).  Electric current to the sacral nerves (sacral nerve  stimulation).  Antidepressant medicine.  Psychiatric therapy, counseling, or group therapy.  Exercise and physical therapy.  Meditation and yoga therapy.  Acupuncture. HOME CARE INSTRUCTIONS   Only take over-the-counter or prescription medicines as directed by your health care provider.  Place a heating pad or hot water bottle on your lower back or abdomen. Do not sleep with the heating pad.  Use aerobic exercises, walking, swimming, biking, and other exercises to help lessen the cramping.  Massage to the lower back or abdomen may help.  Stop smoking.  Avoid alcohol and caffeine. SEEK MEDICAL CARE IF:   Your pain does not get better with medicine.  You have pain with sexual intercourse.  Your pain increases and is not controlled with medicines.  You have abnormal vaginal bleeding with your period.  You develop nausea or vomiting with your period that is not controlled with medicine. SEEK IMMEDIATE MEDICAL CARE IF:  You pass out.  Document Released: 02/24/2005 Document Revised: 10/27/2012 Document Reviewed: 08/12/2012 [facility] Patient Information 2015 [place], [place]. This information is not intended to replace advice given to you by your health care provider. Make sure you discuss any questions you have with your health care provider.

## 2013-11-25 NOTE — Assessment & Plan Note (Signed)
A1c well controlled. Foot exam done today. Patient to schedule followup appointment with [facility] for retinal scan. Continue current medications. Followup in 3 months

## 2013-11-25 NOTE — Assessment & Plan Note (Signed)
>>  ASSESSMENT AND PLAN FOR ANXIETY WRITTEN ON 11/25/2013  5:06 PM BY Shirlee Latch, MD  Doing on current medication regimen. No SI/HI. Refill of Ativan given today. Will followup in 3 months.

## 2013-11-25 NOTE — Assessment & Plan Note (Addendum)
Patient with history of endometrial hyperplasia status post D&C in 2011. Advised patient that she can take Ibuprofen every 6 hours as needed for pain. Order transvaginal and transabdominal ultrasound to evaluate endometrium. Patient declines vaginal exam today.

## 2013-11-25 NOTE — Progress Notes (Signed)
Subjective:   [name] is a 43 y.o. female with a history of anxiety, depression, morbid obesity, T2 DM, endometrial hyperplasia s/p D&C in ~2011 here for menstrual cramps, followup on anxiety, T2 DM, obesity.  1. Menstrual cramping:  Patient reports that since her D&C in 2011 she has only had her menstrual period once every 3 months. For the last 3 cycles, since March, patient reports worsening of her menstrual cramps. They last for approximately a week and continue for a few days after she stops bleeding. She also gets nauseous with her cramps. During this cycle, she reports that her cramps were so bad that she couldn't go to work. She denies any increase in bleeding.  2. Anxiety/Depression:  Patient is followed for counseling at [facility]. They prescribed rest of her psychiatric medications. She denies any SI/HI. She also takes Ativan 1 mg in the morning and 2 mg at night. She's been on this for more than 5 years.  3. T2 DM: Patient is compliant with her medications. She does not check her blood sugars at home. She does not know home has been since she's had a foot exam her retinal exam. Denies any symptoms of hypoglycemia.  4. Obesity: Patient has lost approximately 12 pounds since she was last seen here about 3 months ago. She is trying to exercise more and eat healthier lunches and breakfast. She is packing her lunch more often.  Review of Systems:  Per HPI. All other systems reviewed and are negative.    Past Medical History: Patient Active Problem List   Diagnosis Date Noted  . Menstrual cramps 11/25/2013  . Viral respiratory illness 12/20/2012  . Frequent urination 10/20/2012  . Furuncle 07/07/2012  . Contact dermatitis 10/03/2011  . Suicidal ideation 05/27/2011  . Hip pain, right 03/13/2011  . Chronic kidney disease (CKD), stage III (moderate) 11/06/2010  . ENDOMETRIAL HYPERPLASIA UNSPECIFIED 02/07/2008  . MENORRHAGIA 01/03/2008  . ALLERGIC RHINITIS 06/24/2007  .  HYPERTENSION 02/03/2007  . HYPOTHYROIDISM, UNSPECIFIED 05/07/2006  . DIABETES MELLITUS, II, COMPLICATIONS 73/57/8978  . Morbid obesity 05/07/2006  . ANEMIA, IRON DEFICIENCY, UNSPEC. 05/07/2006  . DEPRESSION, MAJOR, RECURRENT 05/07/2006  . ANXIETY 05/07/2006  . OBSESSIVE COMPUL. DISORDER 05/07/2006    Medications: reviewed and updated Current Outpatient Prescriptions  Medication Sig Dispense Refill  . amLODipine (NORVASC) 5 MG tablet Take 1 tablet (5 mg total) by mouth daily.  90 tablet  3  . Blood Glucose Monitoring Suppl (ONE TOUCH ULTRA SYSTEM KIT) W/DEVICE KIT Check CBG's once daily and prn.  Please substitute glucometer and lancets per formulary.  1 each  0  . busPIRone (BUSPAR) 10 MG tablet Take 10 mg by mouth 2 (two) times daily.      . carvedilol (COREG) 25 MG tablet Take 1 tablet (25 mg total) by mouth 2 (two) times daily.  60 tablet  2  . chlorproMAZINE (THORAZINE) 100 MG tablet Take 100 mg by mouth daily.      . cloNIDine (CATAPRES) 0.1 MG tablet Take 0.1 mg by mouth as needed (anxiety).      Marland Kitchen diphenhydrAMINE (BENADRYL) 25 mg capsule Take 25 mg by mouth at bedtime as needed. sleep      . ferrous sulfate 220 (44 FE) MG/5ML solution Take 5 mLs (220 mg total) by mouth daily.  150 mL  3  . FLUoxetine (PROZAC) 40 MG capsule Take 80 mg by mouth daily.       Marland Kitchen gabapentin (NEURONTIN) 100 MG capsule Take 800 mg  by mouth 3 (three) times daily.       Marland Kitchen glimepiride (AMARYL) 2 MG tablet Take 2 tablets (4 mg total) by mouth daily before breakfast.  180 tablet  3  . glucose blood (ONE TOUCH ULTRA TEST) test strip Check sugar every morning and 2 hours after your biggest meal of the day  100 each  12  . Lancets (ACCU-CHEK MULTICLIX) lancets Use as instructed  100 each  12  . levothyroxine (SYNTHROID, LEVOTHROID) 300 MCG tablet Take 1 tablet (300 mcg total) by mouth daily.  90 tablet  3  . LORazepam (ATIVAN) 1 MG tablet Take 38m at bedtime and 1661mduring the day as needed for anxiety. Do not  fill before 30 days after last refill.  90 tablet  0  . LORazepam (ATIVAN) 1 MG tablet Take 1 tablet (1 mg total) by mouth 2 (two) times daily as needed for anxiety. Take 61m8mt bedtime and 1mg79mring the day as needed for anxiety. Do not fill before 30 days after last refill.  90 tablet  0  . LORazepam (ATIVAN) 1 MG tablet Take 61mg 70mbedtime and 1mg d65mng the day as needed for anxiety. Do not fill before 30 days after last refill.  90 tablet  0  . meloxicam (MOBIC) 7.5 MG tablet Take 1 tablet (7.5 mg total) by mouth daily. Daily for 1 week, then as needed  30 tablet  0  . traZODone (DESYREL) 100 MG tablet        No current facility-administered medications for this visit.    Social History: Never smoker  Objective:  BP 141/82  Pulse 85  Temp(Src) 99.1 F (37.3 C) (Oral)  Ht '5\' 10"'  (1.778 m)  Wt 358 lb 12.8 oz (162.751 kg)  BMI 51.48 kg/m2  Gen:  43 y.o50female sitting in NAD, anxious HEENT: NCAT, MMM, EOMI, PERRL, anicteric sclerae CV: RRR, no MRG, 2+ DP pulses b/l Resp: Non-labored, CTAB, no wheezes noted Abd: Soft, Obese, ND, BS present, no guarding or organomegaly, diffusely mildly TTP Ext: WWP, no edema, no open sores on feet. MSK: Full ROM, strength intact Neuro: Alert and oriented, speech normal, sensation grossly intact  Patient declined vaginal exam today.      Chemistry      Component Value Date/Time   NA 137 07/07/2012 1629   K 4.6 07/07/2012 1629   CL 100 07/07/2012 1629   CO2 24 07/07/2012 1629   BUN 18 07/07/2012 1629   CREATININE 1.41* 07/07/2012 1629   CREATININE 1.60* 12/18/2011 2005      Component Value Date/Time   CALCIUM 9.2 07/07/2012 1629   ALKPHOS 58 07/07/2012 1629   AST 13 07/07/2012 1629   ALT 9 07/07/2012 1629   BILITOT 0.2* 07/07/2012 1629      Lab Results  Component Value Date   WBC 9.1 12/16/2012   HGB 9.0* 12/16/2012   HCT 28.2* 12/16/2012   MCV 80.3 12/16/2012   PLT 568* 12/16/2012   Lab Results  Component Value Date   TSH 7.698*  12/16/2012   Lab Results  Component Value Date   HGBA1C 6.4 11/25/2013   Assessment:     Samantha [id number] y.o85female here for followup of obesity, T2 DM, anxiety and for her menstrual cramps.    Plan:     See problem list for problem-specific plans.   AngelaLavon PaganiniGY-1,  [facility] 11/25/2013  4:54 PM

## 2013-12-08 ENCOUNTER — Ambulatory Visit (HOSPITAL_COMMUNITY): Admission: RE | Admit: 2013-12-08 | Payer: BC Managed Care – PPO | Source: Ambulatory Visit

## 2014-02-24 ENCOUNTER — Ambulatory Visit (INDEPENDENT_AMBULATORY_CARE_PROVIDER_SITE_OTHER): Payer: BC Managed Care – PPO | Admitting: Family Medicine

## 2014-02-24 ENCOUNTER — Encounter: Payer: Self-pay | Admitting: Family Medicine

## 2014-02-24 VITALS — BP 176/82 | HR 84 | Temp 98.7°F | Ht 70.0 in | Wt 362.9 lb

## 2014-02-24 DIAGNOSIS — A084 Viral intestinal infection, unspecified: Secondary | ICD-10-CM | POA: Diagnosis not present

## 2014-02-24 DIAGNOSIS — N946 Dysmenorrhea, unspecified: Secondary | ICD-10-CM | POA: Diagnosis not present

## 2014-02-24 DIAGNOSIS — F411 Generalized anxiety disorder: Secondary | ICD-10-CM | POA: Diagnosis not present

## 2014-02-24 DIAGNOSIS — I10 Essential (primary) hypertension: Secondary | ICD-10-CM | POA: Diagnosis not present

## 2014-02-24 MED ORDER — PROMETHAZINE HCL 12.5 MG PO TABS: 12.5000 mg | ORAL_TABLET | Freq: Three times a day (TID) | ORAL | Status: DC | PRN

## 2014-02-24 MED ORDER — LORAZEPAM 1 MG PO TABS: 1.0000 mg | ORAL_TABLET | Freq: Two times a day (BID) | ORAL | Status: DC | PRN

## 2014-02-24 MED ORDER — AMLODIPINE BESYLATE 10 MG PO TABS: 10.0000 mg | ORAL_TABLET | Freq: Every day | ORAL | Status: DC

## 2014-02-24 NOTE — Assessment & Plan Note (Signed)
>>  ASSESSMENT AND PLAN FOR ANXIETY WRITTEN ON 02/24/2014  5:10 PM BY Shirlee Latch, MD  Rx for 1 month of Ativan. Letter sent to [facility] regarding them managing all of psychiatric medicines going forward.

## 2014-02-24 NOTE — Patient Instructions (Signed)
It was nice to see you again today. I would like for [facility] to continue managing all of your psychiatric medications including the Ativan.  I will give you a one month supply of Ativan for now.  Please schedule an appointment with [facility] for medication management.  Please get a pelvic ultrasound to evaluate endometrium. Come back and see me in 3 months for follow-up.  Take care, Dr. Jacinto Reap   Generalized Anxiety Disorder Generalized anxiety disorder (GAD) is a mental disorder. It interferes with life functions, including relationships, work, and school. GAD is different from normal anxiety, which everyone experiences at some point in their lives in response to specific life events and activities. Normal anxiety actually helps [name] prepare for and get through these life events and activities. Normal anxiety goes away after the event or activity is over.  GAD causes anxiety that is not necessarily related to specific events or activities. It also causes excess anxiety in proportion to specific events or activities. The anxiety associated with GAD is also difficult to control. GAD can vary from mild to severe. People with severe GAD can have intense waves of anxiety with physical symptoms (panic attacks).  SYMPTOMS The anxiety and worry associated with GAD are difficult to control. This anxiety and worry are related to many life events and activities and also occur more days than not for 6 months or longer. People with GAD also have three or more of the following symptoms (one or more in children):  Restlessness.   Fatigue.  Difficulty concentrating.   Irritability.  Muscle tension.  Difficulty sleeping or unsatisfying sleep. DIAGNOSIS GAD is diagnosed through an assessment by your health care provider. Your health care provider will ask you questions aboutyour mood,physical symptoms, and events in your life. Your health care provider may ask you about your medical history and use of alcohol or  drugs, including prescription medicines. Your health care provider may also do a physical exam and blood tests. Certain medical conditions and the use of certain substances can cause symptoms similar to those associated with GAD. Your health care provider may refer you to a mental health specialist for further evaluation. TREATMENT The following therapies are usually used to treat GAD:   Medication. Antidepressant medication usually is prescribed for long-term daily control. Antianxiety medicines may be added in severe cases, especially when panic attacks occur.   Talk therapy (psychotherapy). Certain types of talk therapy can be helpful in treating GAD by providing support, education, and guidance. A form of talk therapy called cognitive behavioral therapy can teach you healthy ways to think about and react to daily life events and activities.  Stress managementtechniques. These include yoga, meditation, and exercise and can be very helpful when they are practiced regularly. A mental health specialist can help determine which treatment is best for you. Some people see improvement with one therapy. However, other people require a combination of therapies. Document Released: 06/21/2012 Document Revised: 07/11/2013 Document Reviewed: 06/21/2012 [facility] Patient Information 2015 [place], [place]. This information is not intended to replace advice given to you by your health care provider. Make sure you discuss any questions you have with your health care provider.

## 2014-02-24 NOTE — Assessment & Plan Note (Signed)
Patient scheduled for pelvic ultrasound. Will follow-up after results available. May need referral back to gynecology or endometrial biopsy.

## 2014-02-24 NOTE — Assessment & Plan Note (Signed)
Symptomatic management. Rx for Phenergan given.

## 2014-02-24 NOTE — Assessment & Plan Note (Signed)
Currently now well controlled. BP 176/82 at check in and 147/90 on repeat later in visit. We'll increase amlodipine from 5 mg to 10 mg daily.

## 2014-02-24 NOTE — Assessment & Plan Note (Signed)
Rx for 1 month of Ativan. Letter sent to [facility] regarding them managing all of psychiatric medicines going forward.

## 2014-02-24 NOTE — Progress Notes (Signed)
   Subjective:   [name] is a 43 y.o. female with a history of anxiety, depression, hypertension, abnormal uterine bleeding here for follow-up.  1. Depression, anxiety: Patient is followed by [facility] behavioral health for counseling and medication management. They prescribed her BuSpar, clonidine, Prozac, trazodone. She's previously been prescribed Ativan by our clinic 2 mg daily at bedtime and 1 mg every morning. She reports that her depression is worse over the holidays especially when she is visiting her mother passed away 6 years ago.  2. Hypertension: Patient reports compliance with amlodipine. Denies any side effects, hypotension. Denies any chest pain, shortness of breath. Denies any lower extremity edema.  3. Abnormal uterine bleeding: At last visit, patient described heavy bleeding associated with cramping every 3 months. Since her D&C in 2011 for endometrial hyperplasia, she's only had menstrual cycles every 3 months. She's not had any bleeding since her last visit but it has been about 3 months. She has been unable to get a pelvic ultrasound yet.  4. N/V/D: Patient reports nausea vomiting diarrhea for 2-3 days. 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 She would like something to take for nausea.   Review of Systems:  Per HPI. All other systems reviewed and are negative.   PMH, PSH, Medications, Allergies, and FmHx reviewed and updated in EMR.   Objective:  BP 176/82 mmHg  Pulse 84  Temp(Src) 98.7 F (37.1 C) (Oral)  Ht 5\' 10"  (1.778 m)  Wt 362 lb 14.4 oz (164.61 kg)  BMI 52.07 kg/m2  Gen:  43 y.o. female in NAD HEENT: NCAT, MMM, EOMI, PERRL, anicteric sclerae CV: RRR, no MRG, no JVD Resp: Non-labored, CTAB, no wheezes noted Abd: Soft, NTND, BS present, no guarding or organomegaly Ext: WWP, no edema Neuro: Alert and oriented, speech normal      Chemistry      Component Value Date/Time   NA 137 07/07/2012 1629   K 4.6  07/07/2012 1629   CL 100 07/07/2012 1629   CO2 24 07/07/2012 1629   BUN 18 07/07/2012 1629   CREATININE 1.41* 07/07/2012 1629   CREATININE 1.60* 12/18/2011 2005      Component Value Date/Time   CALCIUM 9.2 07/07/2012 1629   ALKPHOS 58 07/07/2012 1629   AST 13 07/07/2012 1629   ALT 9 07/07/2012 1629   BILITOT 0.2* 07/07/2012 1629      Lab Results  Component Value Date   WBC 9.1 12/16/2012   HGB 9.0* 12/16/2012   HCT 28.2* 12/16/2012   MCV 80.3 12/16/2012   PLT 568* 12/16/2012   Lab Results  Component Value Date   TSH 7.698* 12/16/2012   Lab Results  Component Value Date   HGBA1C 6.4 11/25/2013   Assessment:     [name] is a 43 y.o. female here for depression, anxiety, hypertension, abnormal uterine bleeding, GI upset.    Plan:     See problem list for problem-specific plans.   Lavon Paganini, MD PGY-1,  [facility] 02/24/2014  5:00 PM

## 2014-02-28 ENCOUNTER — Encounter: Payer: Self-pay | Admitting: *Deleted

## 2014-02-28 NOTE — Progress Notes (Signed)
Consult letter/info from 02/24/14 faxed to [facility] [phone]) per Dr. Nancy Nordmann request.  Burna Forts, BSN, RN-BC

## 2014-03-06 ENCOUNTER — Ambulatory Visit (HOSPITAL_COMMUNITY): Payer: BC Managed Care – PPO

## 2014-03-22 ENCOUNTER — Other Ambulatory Visit: Payer: Self-pay | Admitting: Family Medicine

## 2014-03-22 NOTE — Telephone Encounter (Signed)
Pt can into office very upset.  Dr B had sent a letter to [facility] in Dec asking for them to manage her meds---ativan. Beverly Sessions says they will not refill the Ativan since [facility] gave her the initial RX. She runs out of it Jan 21.  She says she cannot be without this medication She asked to speak to the director about this. Please advise

## 2014-03-22 NOTE — Telephone Encounter (Signed)
Will forward to PCP 

## 2014-03-23 NOTE — Telephone Encounter (Signed)
Patient should be seen in clinic.  Can we schedule her an appointment to discuss this?

## 2014-03-23 NOTE — Telephone Encounter (Signed)
Left message on voicemail for patient to call back and schedule appointment with PCP.

## 2014-03-24 ENCOUNTER — Telehealth: Payer: Self-pay | Admitting: Family Medicine

## 2014-03-24 DIAGNOSIS — F411 Generalized anxiety disorder: Secondary | ICD-10-CM

## 2014-03-24 MED ORDER — LORAZEPAM 1 MG PO TABS
1.0000 mg | ORAL_TABLET | Freq: Two times a day (BID) | ORAL | Status: DC | PRN
Start: 2014-03-24 — End: 2014-04-07

## 2014-03-24 NOTE — Telephone Encounter (Signed)
Pt really needs to talk to Dr. Brita Romp to about her Ativan. [name] is not going to manage this and she will be out on the 21 st. SHe has a appointment for 1/29 but what is she to do in the meantime. Can we call in enough medication to last from the 21 st through the 29th. Please call patient ASAP to discuss [phone]. jw

## 2014-03-24 NOTE — Telephone Encounter (Signed)
Called and left VM at number given.  Unsure if it was correct phone number, but left no patient information.  Called mobile number listed in demographics and left VM as well.  Will plan to leave Rx at front desk for 2 week supply of ativan to hold patient over until appt on 29th.  We will discuss medication more at that visit.  Please inform patient that Rx is available if she calls back.  Lavon Paganini, MD, MPH PGY-1,  [facility] 03/24/2014 2:58 PM

## 2014-04-03 ENCOUNTER — Telehealth: Payer: Self-pay | Admitting: *Deleted

## 2014-04-03 NOTE — Telephone Encounter (Signed)
Verbal order by Dr. Brita Romp stated 1 tab twice a day as needed for anxiety is the correct direction for Ativan.  Derl Barrow, RN

## 2014-04-03 NOTE — Telephone Encounter (Signed)
[facility] pharmacy called needing clarification in the Ativan directions.  There are two sets of directions.  Please clarify.  Derl Barrow, RN

## 2014-04-04 NOTE — Telephone Encounter (Signed)
Verbal order given for Ativan 1 tablet BID prn.  Lavon Paganini, MD, MPH PGY-1,  [facility] 04/04/2014 10:02 AM

## 2014-04-07 ENCOUNTER — Encounter: Payer: Self-pay | Admitting: Family Medicine

## 2014-04-07 ENCOUNTER — Ambulatory Visit (INDEPENDENT_AMBULATORY_CARE_PROVIDER_SITE_OTHER): Payer: BC Managed Care – PPO | Admitting: Family Medicine

## 2014-04-07 VITALS — BP 132/84 | HR 88 | Temp 98.7°F | Ht 70.0 in | Wt 369.0 lb

## 2014-04-07 DIAGNOSIS — F411 Generalized anxiety disorder: Secondary | ICD-10-CM

## 2014-04-07 DIAGNOSIS — F332 Major depressive disorder, recurrent severe without psychotic features: Secondary | ICD-10-CM

## 2014-04-07 MED ORDER — LORAZEPAM 1 MG PO TABS: 1.0000 mg | ORAL_TABLET | Freq: Three times a day (TID) | ORAL | Status: DC | PRN

## 2014-04-07 MED ORDER — LORAZEPAM 1 MG PO TABS
1.0000 mg | ORAL_TABLET | Freq: Three times a day (TID) | ORAL | Status: DC | PRN
Start: 2014-04-07 — End: 2014-07-19

## 2014-04-07 NOTE — Assessment & Plan Note (Signed)
Will continue management of Ativan, as [facility] refuses to manage. Rx for Ativan 1mg  #90 per month x3 months given F/u in 3 months

## 2014-04-07 NOTE — Patient Instructions (Signed)
We discussed anxiety management with ativan today.   See me in 3 months.  Dr. Jacinto Reap  Generalized Anxiety Disorder Generalized anxiety disorder (GAD) is a mental disorder. It interferes with life functions, including relationships, work, and school. GAD is different from normal anxiety, which everyone experiences at some point in their lives in response to specific life events and activities. Normal anxiety actually helps [name] prepare for and get through these life events and activities. Normal anxiety goes away after the event or activity is over.  GAD causes anxiety that is not necessarily related to specific events or activities. It also causes excess anxiety in proportion to specific events or activities. The anxiety associated with GAD is also difficult to control. GAD can vary from mild to severe. People with severe GAD can have intense waves of anxiety with physical symptoms (panic attacks).  SYMPTOMS The anxiety and worry associated with GAD are difficult to control. This anxiety and worry are related to many life events and activities and also occur more days than not for 6 months or longer. People with GAD also have three or more of the following symptoms (one or more in children):  Restlessness.   Fatigue.  Difficulty concentrating.   Irritability.  Muscle tension.  Difficulty sleeping or unsatisfying sleep. DIAGNOSIS GAD is diagnosed through an assessment by your health care provider. Your health care provider will ask you questions aboutyour mood,physical symptoms, and events in your life. Your health care provider may ask you about your medical history and use of alcohol or drugs, including prescription medicines. Your health care provider may also do a physical exam and blood tests. Certain medical conditions and the use of certain substances can cause symptoms similar to those associated with GAD. Your health care provider may refer you to a mental health specialist for further  evaluation. TREATMENT The following therapies are usually used to treat GAD:   Medication. Antidepressant medication usually is prescribed for long-term daily control. Antianxiety medicines may be added in severe cases, especially when panic attacks occur.   Talk therapy (psychotherapy). Certain types of talk therapy can be helpful in treating GAD by providing support, education, and guidance. A form of talk therapy called cognitive behavioral therapy can teach you healthy ways to think about and react to daily life events and activities.  Stress managementtechniques. These include yoga, meditation, and exercise and can be very helpful when they are practiced regularly. A mental health specialist can help determine which treatment is best for you. Some people see improvement with one therapy. However, other people require a combination of therapies. Document Released: 06/21/2012 Document Revised: 07/11/2013 Document Reviewed: 06/21/2012 [facility] Patient Information 2015 [place], [place]. This information is not intended to replace advice given to you by your health care provider. Make sure you discuss any questions you have with your health care provider.

## 2014-04-07 NOTE — Assessment & Plan Note (Signed)
Continue management per [facility] No SI currently Advised patient to seek medical care if SI recurs

## 2014-04-07 NOTE — Progress Notes (Signed)
   Subjective:   [name] is a 44 y.o. female with a history of anxiety, depression, HTN here for depression/anxiety follow-up.   Patient is followed by [facility] behavioral health for counseling and medication management. They prescribe her BuSpar, clonidine, Prozac, trazodone. She's previously been prescribed Ativan by our clinic 2 mg daily at bedtime and 1 mg every morning.  At last office visit, recommended that [facility] prescribed all psych meds.  [facility] told patient that they would not prescribe ativan, because we prescribed it first. Patient feels like she needs ativan for panic attacks, anxiety that is fast acting.  Has h/o bad depression. Knows that ativan is a crutch, probably.    Has had SI in past with suicide attempt in 2012. Denies any SI currently. Is working toward joining a Geophysicist/field seismologist, starting yoga, meditating for help with anxiety.  Review of Systems:  Per HPI. All other systems reviewed and are negative.   PMH, PSH, Medications, Allergies, and FmHx reviewed and updated in EMR.  Social History: non smoker  Objective:  There were no vitals taken for this visit.  Gen:  44 y.o. female in NAD HEENT: NCAT, MMM, EOMI, PERRL, anicteric sclerae CV: RRR, no MRG, no JVD Resp: Non-labored, CTAB, no wheezes noted Abd: Soft, NTND Ext: WWP, no edema Neuro: Alert and oriented, speech normal Psych: sometimes tearful, variable affect, very anxious.      Chemistry      Component Value Date/Time   NA 137 07/07/2012 1629   K 4.6 07/07/2012 1629   CL 100 07/07/2012 1629   CO2 24 07/07/2012 1629   BUN 18 07/07/2012 1629   CREATININE 1.41* 07/07/2012 1629   CREATININE 1.60* 12/18/2011 2005      Component Value Date/Time   CALCIUM 9.2 07/07/2012 1629   ALKPHOS 58 07/07/2012 1629   AST 13 07/07/2012 1629   ALT 9 07/07/2012 1629   BILITOT 0.2* 07/07/2012 1629      Lab Results  Component Value Date   WBC 9.1 12/16/2012   HGB 9.0* 12/16/2012   HCT 28.2* 12/16/2012   MCV 80.3 12/16/2012   PLT 568* 12/16/2012   Lab Results  Component Value Date   TSH 7.698* 12/16/2012   Lab Results  Component Value Date   HGBA1C 6.4 11/25/2013   Assessment:     [name] is a 44 y.o. female here for follow-up of anxiety/depression.    Plan:     See problem list for problem-specific plans.   Lavon Paganini, MD PGY-1,  [facility] 04/07/2014  4:16 PM

## 2014-04-07 NOTE — Assessment & Plan Note (Signed)
>>  ASSESSMENT AND PLAN FOR ANXIETY WRITTEN ON 04/07/2014  5:00 PM BY Shirlee Latch, MD  Will continue management of Ativan, as [facility] refuses to manage. Rx for Ativan 1mg  #90 per month x3 months given F/u in 3 months

## 2014-06-22 ENCOUNTER — Telehealth: Payer: Self-pay | Admitting: Family Medicine

## 2014-06-22 ENCOUNTER — Encounter: Payer: Self-pay | Admitting: Family Medicine

## 2014-06-22 ENCOUNTER — Ambulatory Visit (INDEPENDENT_AMBULATORY_CARE_PROVIDER_SITE_OTHER): Payer: BC Managed Care – PPO | Admitting: Family Medicine

## 2014-06-22 ENCOUNTER — Encounter: Payer: Self-pay | Admitting: Clinical

## 2014-06-22 VITALS — BP 163/62 | HR 89 | Temp 98.8°F | Wt 375.0 lb

## 2014-06-22 DIAGNOSIS — J309 Allergic rhinitis, unspecified: Secondary | ICD-10-CM | POA: Diagnosis not present

## 2014-06-22 DIAGNOSIS — J4521 Mild intermittent asthma with (acute) exacerbation: Secondary | ICD-10-CM

## 2014-06-22 DIAGNOSIS — J45901 Unspecified asthma with (acute) exacerbation: Secondary | ICD-10-CM | POA: Insufficient documentation

## 2014-06-22 DIAGNOSIS — F4321 Adjustment disorder with depressed mood: Secondary | ICD-10-CM | POA: Insufficient documentation

## 2014-06-22 DIAGNOSIS — Z634 Disappearance and death of family member: Principal | ICD-10-CM

## 2014-06-22 MED ORDER — CETIRIZINE HCL 10 MG PO TABS: 10.0000 mg | ORAL_TABLET | Freq: Every day | ORAL | Status: DC

## 2014-06-22 MED ORDER — BENZONATATE 200 MG PO CAPS: 200.0000 mg | ORAL_CAPSULE | Freq: Two times a day (BID) | ORAL | Status: DC | PRN

## 2014-06-22 MED ORDER — ALBUTEROL SULFATE (2.5 MG/3ML) 0.083% IN NEBU: 2.5000 mg | INHALATION_SOLUTION | RESPIRATORY_TRACT | Status: DC | PRN

## 2014-06-22 NOTE — Progress Notes (Signed)
   Subjective:   [name] is a 44 y.o. female with a history of anxiety, depression, previous suicide attempt here for recent loss of son and cough.  Grief:  - Patient reports that her 1 y/o son was shot and killed yesterday.   - Of note, patient has h/o SI and suicide attempt in 2012 when she took overdose of TCA and metformin and required HD for some time for severe AKI. - she reports no SI currently and promises that she will call someone if she has SI - Patient seen with Constance Holster, who offered grief counseling resources (Hospice counseling) - PAtient reports that she will either sink or swim from here - She has a good support system: father, sister, other son, and work friends  Cough:  - Intermittent cough productive of yellow sputum 2 weeks - Associated with chest congestion, 2 episodes of syncope after coughing heavily, and shortness of breath - Patient reports she often feels like this in the fall and spring of every year - Symptoms are worse at night - Denies any fever - Has been taking Delsym without relief - Was told in the past that she had asthma but is unable to afford an albuterol inhaler   Review of Systems:  Per HPI. All other systems reviewed and are negative.   PMH, PSH, Medications, Allergies, and FmHx reviewed and updated in EMR.  Social History: non smoker  Objective:  BP 163/62 mmHg  Pulse 89  Temp(Src) 98.8 F (37.1 C) (Oral)  Wt 375 lb (170.099 kg)  Gen:  44 y.o. female in NAD, tearful HEENT: NCAT, MMM, EOMI, PERRL, anicteric sclerae, hoarse voice CV: RRR, no MRG, no JVD Resp: Non-labored, diffuse expikratory wheezes noted, good air movement Abd: Soft, NTND, BS present, no guarding or organomegaly Ext: WWP, no edema Neuro: Alert and oriented, depressed mood, appropriate affect     Assessment:     [name] is a 44 y.o. female here for grief and cough.    Plan:     See problem list for problem-specific plans.   Virginia Crews, MD PGY-1,  [facility] 06/22/2014  3:16 PM

## 2014-06-22 NOTE — Telephone Encounter (Signed)
CSW called pt to provide support.Pt tearful on the phone stating her 45yo died yesterday unexpectedly. Pt requests to have PCP complete some paperwork for her employer. Pt also requesting to see PCP in clinic. PCP agreeable to fit in pt today at 1:30p. Pt very appreciative and plans to be here with paperwork in hand. CSW to gather resources for counseling as pt mentioned she would be interested in counseling.  Hunt Oris, MSW, [place]

## 2014-06-22 NOTE — Telephone Encounter (Signed)
Pt called and I was wondering if you could call her. Her son just died and she is very distraught.jw

## 2014-06-22 NOTE — Telephone Encounter (Signed)
Pt called and would like a letter stating that she needs to be out of work because her son passed away. jw

## 2014-06-22 NOTE — Assessment & Plan Note (Signed)
Patient appropriately upset given circumstances Appreciate assistance from [place] Patient to attend counseling with hospice grief services Patient denies SI and does not seem to be a danger to herself or others at this time I will fill out FMLA paperwork and return to patient soon F/u prn

## 2014-06-22 NOTE — Patient Instructions (Signed)
It was nice to see you again today. If you need anything, please don't hesitate to call Constance Holster or me.  I will get this FMLA paperwork done ASAP and someone will call you when it is ready for pick up.  If you have any thoughts of wanting to hurt yourself, please go to the emergency room or call someone for help.  For your cough and asthma, you can use a nebulizer every 4 hours as needed for shortness of breath or wheezing.  You can take the Tessalon as needed for cough.  Take the Zyrtec daily for allergies, especially in spring and fall.  Take Care, Dr. B  Asthma Asthma is a recurring condition in which the airways tighten and narrow. Asthma can make it difficult to breathe. It can cause coughing, wheezing, and shortness of breath. Asthma episodes, also called asthma attacks, range from minor to life-threatening. Asthma cannot be cured, but medicines and lifestyle changes can help control it. CAUSES Asthma is believed to be caused by inherited (genetic) and environmental factors, but its exact cause is unknown. Asthma may be triggered by allergens, lung infections, or irritants in the air. Asthma triggers are different for each person. Common triggers include:   Animal dander.  Dust mites.  Cockroaches.  Pollen from trees or grass.  Mold.  Smoke.  Air pollutants such as dust, household cleaners, hair sprays, aerosol sprays, paint fumes, strong chemicals, or strong odors.  Cold air, weather changes, and winds (which increase molds and pollens in the air).  Strong emotional expressions such as crying or laughing hard.  Stress.  Certain medicines (such as aspirin) or types of drugs (such as beta-blockers).  Sulfites in foods and drinks. Foods and drinks that may contain sulfites include dried fruit, potato chips, and sparkling grape juice.  Infections or inflammatory conditions such as the flu, a cold, or an inflammation of the nasal membranes (rhinitis).  Gastroesophageal reflux  disease (GERD).  Exercise or strenuous activity. SYMPTOMS Symptoms may occur immediately after asthma is triggered or many hours later. Symptoms include:  Wheezing.  Excessive nighttime or early morning coughing.  Frequent or severe coughing with a common cold.  Chest tightness.  Shortness of breath. DIAGNOSIS  The diagnosis of asthma is made by a review of your medical history and a physical exam. Tests may also be performed. These may include:  Lung function studies. These tests show how much air you breathe in and out.  Allergy tests.  Imaging tests such as X-rays. TREATMENT  Asthma cannot be cured, but it can usually be controlled. Treatment involves identifying and avoiding your asthma triggers. It also involves medicines. There are 2 classes of medicine used for asthma treatment:   Controller medicines. These prevent asthma symptoms from occurring. They are usually taken every day.  Reliever or rescue medicines. These quickly relieve asthma symptoms. They are used as needed and provide short-term relief. Your health care provider will help you create an asthma action plan. An asthma action plan is a written plan for managing and treating your asthma attacks. It includes a list of your asthma triggers and how they may be avoided. It also includes information on when medicines should be taken and when their dosage should be changed. An action plan may also involve the use of a device called a peak flow meter. A peak flow meter measures how well the lungs are working. It helps you monitor your condition. HOME CARE INSTRUCTIONS   Take medicines only as directed by  your health care provider. Speak with your health care provider if you have questions about how or when to take the medicines.  Use a peak flow meter as directed by your health care provider. Record and keep track of readings.  Understand and use the action plan to help minimize or stop an asthma attack without needing  to seek medical care.  Control your home environment in the following ways to help prevent asthma attacks:  Do not smoke. Avoid being exposed to secondhand smoke.  Change your heating and air conditioning filter regularly.  Limit your use of fireplaces and wood stoves.  Get rid of pests (such as roaches and mice) and their droppings.  Throw away plants if you see mold on them.  Clean your floors and dust regularly. Use unscented cleaning products.  Try to have someone else vacuum for you regularly. Stay out of rooms while they are being vacuumed and for a short while afterward. If you vacuum, use a dust mask from a hardware store, a double-layered or microfilter vacuum cleaner bag, or a vacuum cleaner with a HEPA filter.  Replace carpet with wood, tile, or vinyl flooring. Carpet can trap dander and dust.  Use allergy-proof pillows, mattress covers, and box spring covers.  Wash bed sheets and blankets every week in hot water and dry them in a dryer.  Use blankets that are made of polyester or cotton.  Clean bathrooms and kitchens with bleach. If possible, have someone repaint the walls in these rooms with mold-resistant paint. Keep out of the rooms that are being cleaned and painted.  Wash hands frequently. SEEK MEDICAL CARE IF:   You have wheezing, shortness of breath, or a cough even if taking medicine to prevent attacks.  The colored mucus you cough up (sputum) is thicker than usual.  Your sputum changes from clear or white to yellow, green, gray, or bloody.  You have any problems that may be related to the medicines you are taking (such as a rash, itching, swelling, or trouble breathing).  You are using a reliever medicine more than 2-3 times per week.  Your peak flow is still at 50-79% of your personal best after following your action plan for 1 hour.  You have a fever. SEEK IMMEDIATE MEDICAL CARE IF:   You seem to be getting worse and are unresponsive to treatment  during an asthma attack.  You are short of breath even at rest.  You get short of breath when doing very little physical activity.  You have difficulty eating, drinking, or talking due to asthma symptoms.  You develop chest pain.  You develop a fast heartbeat.  You have a bluish color to your lips or fingernails.  You are light-headed, dizzy, or faint.  Your peak flow is less than 50% of your personal best. MAKE SURE YOU:   Understand these instructions.  Will watch your condition.  Will get help right away if you are not doing well or get worse. Document Released: 02/24/2005 Document Revised: 07/11/2013 Document Reviewed: 09/23/2012 [facility] Patient Information 2015 [place], [place]. This information is not intended to replace advice given to you by your health care provider. Make sure you discuss any questions you have with your health care provider.

## 2014-06-22 NOTE — Progress Notes (Signed)
CSW met with pt and PCP to provide emotional support to pt who recently lost her son. CSW provided active listening, validation and resources. CSW provided mother with information on [place] for grief counseling. CSW also provided some education on FMLA. Pt very appreciative and agreeable to seek counseling. Pt also agreeable to contact CSW for any other needs.  Hunt Oris, MSW, [place]

## 2014-06-22 NOTE — Assessment & Plan Note (Addendum)
Reported history of asthma and previously taking albuterol intermittently Suspect acute exacerbation with shortness of breath and wheezing on exam Albuterol neb given in clinic with some improvement in wheezing Patient unable to afford albuterol inhaler We'll prescribe nebulizer machine and albuterol nebs every 4 hours when necessary Also prescribed Tessalon pearls twice a day when necessary for cough Return precautions given

## 2014-06-22 NOTE — Assessment & Plan Note (Signed)
Patient reports she has never taken anything for allergies but symptoms of congestion cough and wheezing during the fall and spring are likely related to allergic rhinitis Start Zyrtec 10 mg daily, especially in spring and fall and symptoms are worse

## 2014-06-22 NOTE — Telephone Encounter (Signed)
Will see patient at 1:30pm today i nclinic.  Can double-book. Thanks!  See clinic progress note for more details regarding plan

## 2014-06-23 ENCOUNTER — Telehealth: Payer: Self-pay | Admitting: *Deleted

## 2014-06-23 NOTE — Telephone Encounter (Signed)
Attempted to call patient again.  No answer and unable to leave message due to mailbox is full.  Will attempt call back later today.  Burna Forts, BSN, RN-BC

## 2014-06-23 NOTE — Telephone Encounter (Signed)
FMLA papers ready for pickup.  Copy made and placed in scan pile to be scanned into chart.  Original placed at front desk for pickup.  Attempted to call patient x 2--no answer and unable to leave a message due to" mailbox is full."  Will attempt to call patient back this afternoon.  Burna Forts, BSN, RN-BC

## 2014-06-27 NOTE — Telephone Encounter (Signed)
Attempted to call patient again.  No answer and left message on voicemail that forms are ready to pickup at front desk.  Burna Forts, BSN, RN-BC

## 2014-07-06 NOTE — Telephone Encounter (Signed)
Mrs. Pompeo need to speak with you asap.  She is due to go back to work on 5/9, but don't want to be back after Mother's Day holiday because of her circumstance.  Please call and advise and also need to discuss medication .

## 2014-07-06 NOTE — Telephone Encounter (Signed)
Called patient about her concerns.  No answer.  Left VM asking patient to call back to the clinic.  If she needs FMLA papers filled out for a different date, please ask her to drop them off and I will fill them out.  I already told her we would give her through 5/9 and reassess.  She can also make another appt with me next week if she wants to talk again.   If she needs a refill on a medication, please let me know which one.  Virginia Crews, MD, MPH PGY-1,  [facility] 07/06/2014 2:40 PM

## 2014-07-07 ENCOUNTER — Telehealth: Payer: Self-pay | Admitting: Family Medicine

## 2014-07-07 NOTE — Telephone Encounter (Signed)
Would also like for PCP to contact her / thanks [employer], ASA

## 2014-07-07 NOTE — Telephone Encounter (Signed)
Attempted to call patient back again.  No answer. Left VM asking her to call back to the clinic if she needed to discuss things.  I will attempt to call again tomorrow.  Will make work note on Monday and leave it at front desk for pick up.  Virginia Crews, MD, MPH PGY-1,  [facility] 07/07/2014 1:31 PM

## 2014-07-07 NOTE — Telephone Encounter (Signed)
Needs a note for work, [employer], stating that she can return to work on May 9th 2016, thanks [employer], ASA

## 2014-07-08 NOTE — Telephone Encounter (Signed)
Attempted to call patient again today.  No answer and left VM without HIPAA info.  Asked patient to call back if she needs to talk.  Virginia Crews, MD, MPH PGY-1,  [facility] 07/08/2014 3:41 PM

## 2014-07-10 ENCOUNTER — Encounter: Payer: Self-pay | Admitting: Family Medicine

## 2014-07-18 ENCOUNTER — Telehealth: Payer: Self-pay | Admitting: Clinical

## 2014-07-18 NOTE — Telephone Encounter (Signed)
CSW contacted pt to explore how she has been coping since her son passed away. Pt informed CSW that she returned to work yesterday and though it has been difficult having to talk about her son/reason for being gone she believes it was time to return to work. CSW validated pts feelings and encouraged pt to explore resources given to her for counseling and or contact CSW if additional needs arise. Pt very appreciative of call and is agreeable to seek grief counseling.  Hunt Oris, MSW, [place]

## 2014-07-18 NOTE — Telephone Encounter (Signed)
-----   Message from Virginia Crews, MD sent at 07/14/2014  9:02 AM EDT ----- Lucrezia Europe,  This is the patient that I was talking to you about yesterday.  Would you might touching base with her on how she is doing?   Thanks! Levada Dy

## 2014-07-19 ENCOUNTER — Ambulatory Visit (INDEPENDENT_AMBULATORY_CARE_PROVIDER_SITE_OTHER): Payer: BC Managed Care – PPO | Admitting: Family Medicine

## 2014-07-19 ENCOUNTER — Encounter: Payer: Self-pay | Admitting: Family Medicine

## 2014-07-19 VITALS — BP 124/71 | HR 102 | Temp 98.7°F | Ht 70.0 in | Wt 371.6 lb

## 2014-07-19 DIAGNOSIS — Z634 Disappearance and death of family member: Secondary | ICD-10-CM

## 2014-07-19 DIAGNOSIS — R45851 Suicidal ideations: Secondary | ICD-10-CM | POA: Diagnosis not present

## 2014-07-19 DIAGNOSIS — F4321 Adjustment disorder with depressed mood: Secondary | ICD-10-CM | POA: Diagnosis not present

## 2014-07-19 DIAGNOSIS — F411 Generalized anxiety disorder: Secondary | ICD-10-CM

## 2014-07-19 MED ORDER — LORAZEPAM 1 MG PO TABS: 1.0000 mg | ORAL_TABLET | Freq: Three times a day (TID) | ORAL | Status: DC | PRN

## 2014-07-19 NOTE — Assessment & Plan Note (Signed)
Patient appropriately upset but improving since last visit Patient plans to call hospice grief services for counseling this week Does not seem to be a danger to herself or others at this time Follow-up in one month

## 2014-07-19 NOTE — Progress Notes (Signed)
   Subjective:   [name] is a 44 y.o. 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 Passive SI since son died, not every day. Denies any active SI or access to guns or harmful medication in excess - Reports that she has a good support system in her husband and coworkers - Reports that she is received an abundance of lasagnas from people trying to show their support  Anxiety: - anxiety has been high during this time following her son's death - Patient is followed by [facility] behavioral health for medication management. They prescribe her BuSpar, clonidine, Prozac, trazodone.  - Taking Ativan 1 mg 3 times a day when necessary  Review of Systems:  Per HPI. All other systems reviewed and are negative.   PMH, PSH, Medications, Allergies, and FmHx reviewed and updated in EMR.  Social History: non smoker   Objective:  BP 124/71 mmHg  Pulse 102  Temp(Src) 98.7 F (37.1 C) (Oral)  Ht 5\' 10"  (D34-534 m)  Wt 371 lb 9.6 oz (168.557 kg)  BMI 53.32 kg/m2  Gen:  44 y.o. female in NAD, occasionally tears up when talking about her loss HEENT: NCAT, MMM, EOMI, PERRL, anicteric sclerae CV: RRR, no MRG Resp: Non-labored, CTAB, no wheezes noted Ext: WWP, no edema Neuro: Alert and oriented, speech normal     Assessment:     [name] is a 44 y.o. female here for grief and anxiety follow-up    Plan:     See problem list for problem-specific plans.   Virginia Crews, MD PGY-1,  [facility] 07/19/2014  4:13 PM

## 2014-07-19 NOTE — Assessment & Plan Note (Signed)
Reports passive SI, denies any plan or access to harmful substances or guns Agrees to safety plan Good support system

## 2014-07-19 NOTE — Assessment & Plan Note (Signed)
>>  ASSESSMENT AND PLAN FOR ANXIETY WRITTEN ON 07/19/2014  4:39 PM BY Erasmo Downer, MD  Rx for Ativan 1 mg TID prn #90 per month 1 month given Would like for patient to see [facility] counseling services in the future Follow-up in one month

## 2014-07-19 NOTE — Assessment & Plan Note (Addendum)
Rx for Ativan 1 mg TID prn #90 per month 1 month given Would like for patient to see [facility] counseling services in the future Follow-up in one month

## 2014-07-19 NOTE — Patient Instructions (Signed)
Nice to see you again today. Continue take Ativan as needed for anxiety. Try calling the hospice counseling this week. Come back and see me in one month so we can continue to follow-up on this. If you're suicidal thoughts escalate or you begin to actively think about harming yourself please go to the emergency room.  Take care, Dr. Jacinto Reap

## 2014-08-11 ENCOUNTER — Telehealth: Payer: Self-pay | Admitting: Family Medicine

## 2014-08-11 NOTE — Telephone Encounter (Signed)
Please check your schedule for next week and 2wks out and see if we can't get [name] in to see you for medication f/u.  Let me know as soon as you see where you can book her into your sched.

## 2014-08-11 NOTE — Telephone Encounter (Signed)
Can you cancel the OB appt that is scheduled on 6/21 (that patient is no longer pregnant and won't be coming to that appt) and schedule [name] for an appointment on that date?    Let me know if you have any questions. Thanks.  Virginia Crews, MD, MPH PGY-1,  [facility] 08/11/2014 4:55 PM

## 2014-08-29 ENCOUNTER — Encounter: Payer: Self-pay | Admitting: Family Medicine

## 2014-08-29 ENCOUNTER — Ambulatory Visit (INDEPENDENT_AMBULATORY_CARE_PROVIDER_SITE_OTHER): Payer: BC Managed Care – PPO | Admitting: Family Medicine

## 2014-08-29 VITALS — BP 162/80 | HR 101 | Temp 98.8°F | Ht 70.0 in | Wt 379.0 lb

## 2014-08-29 DIAGNOSIS — E039 Hypothyroidism, unspecified: Secondary | ICD-10-CM | POA: Diagnosis not present

## 2014-08-29 DIAGNOSIS — F411 Generalized anxiety disorder: Secondary | ICD-10-CM | POA: Diagnosis not present

## 2014-08-29 DIAGNOSIS — Z634 Disappearance and death of family member: Secondary | ICD-10-CM

## 2014-08-29 DIAGNOSIS — I10 Essential (primary) hypertension: Secondary | ICD-10-CM | POA: Diagnosis not present

## 2014-08-29 DIAGNOSIS — F4321 Adjustment disorder with depressed mood: Secondary | ICD-10-CM

## 2014-08-29 DIAGNOSIS — E118 Type 2 diabetes mellitus with unspecified complications: Secondary | ICD-10-CM

## 2014-08-29 LAB — POCT GLYCOSYLATED HEMOGLOBIN (HGB A1C): HEMOGLOBIN A1C: 7

## 2014-08-29 MED ORDER — LORAZEPAM 1 MG PO TABS
1.0000 mg | ORAL_TABLET | Freq: Three times a day (TID) | ORAL | Status: DC | PRN
Start: 2014-08-29 — End: 2014-12-22

## 2014-08-29 MED ORDER — LORAZEPAM 1 MG PO TABS: 1.0000 mg | ORAL_TABLET | Freq: Three times a day (TID) | ORAL | Status: DC | PRN

## 2014-08-29 NOTE — Assessment & Plan Note (Signed)
Continue current dose of Synthroid for now TSH at next visit after patient has been more reliably taking her medication

## 2014-08-29 NOTE — Progress Notes (Signed)
Subjective:   [name] is a 44 y.o. female with a history of anxiety, depression, OCD, grief at the loss of a child, T2 DM, HTN, hypothyroidism here for grief and anxiety, T2 DM, HTN, hypothyroidism.  Grief: - Recently seen 4/14 after her son was murdered the day before - Out of work [employer]) through 5/9 - returned to work 5/9 and now on summer vacation - counseling: went to hospice for individual counseling session, but wants to start group sessions - Has had SI in past with suicide attempt in 2010-06-23. Passive SI since son died, not every day. Denies any active SI or access to guns or harmful medication in excess - Reports that she has a good support system in her husband - but likes to keep things to herself  Anxiety: - anxiety has been high during this time following her son's death - Patient is followed by [facility] for medication management. They prescribe her BuSpar, clonidine, Prozac, trazodone.  - Taking Ativan 1 mg 3 times a day when necessary - GAD 7 score 21 and somewhat difficult  T2DM - taking glimepiride 4mg  daily - misses ~2 doses per week - cannot take metformin 2/2 previous suicide attempt and overdose on metformin with severe AKI - denies med SEs, sxs of hypoglycemia - doesn't monitor BGs at home - last eye exam unknonw - but planning for one in next  HTN - taking norvasc 10mg  daily  - coreg 25mg  is on medication list, but patient does not recognize this name - denies any medication SEs, sxs of hypoTN, CP, SOB, HA, vision changes or LE edema - missing ~ every other day - doesn't take BP at home - thinks BP is high today because she is upset  Hypothyroidism: - taking synthroid 377mcg daily - last TSH in 22-Jun-2012 - missing doses ~5 times weekly  Review of Systems:  Per HPI. All other systems reviewed and are negative.   PMH, PSH, Medications, Allergies, and FmHx reviewed and updated in EMR.  Social History: non smoker  Objective:  BP  162/80 mmHg  Pulse 101  Temp(Src) 98.8 F (37.1 C) (Oral)  Ht 5\' 10"  (1.778 m)  Wt 379 lb (171.913 kg)  BMI 54.38 kg/m2  Gen:  44 y.o. female sitting in NAD HEENT: NCAT, MMM, EOMI, PERRL, anicteric sclerae CV: RRR, no MRG Resp: Non-labored, CTAB, mild expiratory wheezes noted Ext: WWP, no edema Skin: No rashes Neuro: Alert and oriented, speech normal Psych: Anxious, tearful      Chemistry      Component Value Date/Time   NA 137 07/07/2012 1629   K 4.6 07/07/2012 1629   CL 100 07/07/2012 1629   CO2 24 07/07/2012 1629   BUN 18 07/07/2012 1629   CREATININE 1.41* 07/07/2012 1629   CREATININE 1.60* 12/18/2011 2005      Component Value Date/Time   CALCIUM 9.2 07/07/2012 1629   ALKPHOS 58 07/07/2012 1629   AST 13 07/07/2012 1629   ALT 9 07/07/2012 1629   BILITOT 0.2* 07/07/2012 1629      Lab Results  Component Value Date   WBC 9.1 12/16/2012   HGB 9.0* 12/16/2012   HCT 28.2* 12/16/2012   MCV 80.3 12/16/2012   PLT 568* 12/16/2012   Lab Results  Component Value Date   TSH 7.698* 12/16/2012   Lab Results  Component Value Date   HGBA1C 7.0 08/29/2014   Assessment:     [name] is a 44 y.o. female here  for anxiety, grief, T2DM, HTN, hypothyroidism    Plan:     See problem list for problem-specific plans.   Virginia Crews, MD PGY-1,  [facility] 08/29/2014  3:50 PM

## 2014-08-29 NOTE — Assessment & Plan Note (Signed)
A1c almost at goal at 7.0 Continue glimepiride 4 mg daily Suspect that no longer missing doses will improve A1c Foot exam in next visit

## 2014-08-29 NOTE — Assessment & Plan Note (Signed)
>>  ASSESSMENT AND PLAN FOR ANXIETY WRITTEN ON 08/29/2014  4:59 PM BY Erasmo Downer, MD  Rx for Ativan 1 mg 3 times a day when necessary #90 per month 3 months given Continue counseling and consider [facility] counseling services in the future Follow-up in 3 months

## 2014-08-29 NOTE — Assessment & Plan Note (Signed)
Patient appropriately upset with mild improvement since last visit Continue hospice grief counseling services Not currently a danger to herself or others Follow-up in 3 months

## 2014-08-29 NOTE — Assessment & Plan Note (Addendum)
Currently uncontrolled, but patient is very stressed and agitated Continue amlodipine 10 mg daily for now Start daily aspirin therapy May consider adding beta blocker at next visit Bmet in next visit

## 2014-08-29 NOTE — Assessment & Plan Note (Signed)
Rx for Ativan 1 mg 3 times a day when necessary #90 per month 3 months given Continue counseling and consider [facility] counseling services in the future Follow-up in 3 months

## 2014-08-29 NOTE — Patient Instructions (Signed)
Nice to see you again today. Keep taking your current medications. I like to see you back in one month to talk about your diabetes, blood pressure, thyroid.  Take care, Dr. Jacinto Reap

## 2014-09-10 ENCOUNTER — Telehealth: Payer: Self-pay | Admitting: Family Medicine

## 2014-09-10 NOTE — Telephone Encounter (Signed)
Emergency Line / After Hours Call  Dx with asthma and allergies. Having an ongoing cough and wheezing. Was given inhaler. Not able to walk through the apt without becoming SOB. Reports coughing so hard that she passes out. Symptoms started since April but worse since last week.  Last dose of albuterol at 7 am. Reports no relief in medication. Hx is similar to her symptoms during appt on 06/22/14.  Patient with reported hx of asthma and no COPD.   Advised that she go to urgent care to be evaluated. If she elects to not go to urgent care then she can take albuterol every 4 hours for the next 24 hours and then call back.   Clearance Coots, MD Family Medicine PGY-3

## 2014-09-19 ENCOUNTER — Telehealth: Payer: Self-pay | Admitting: Family Medicine

## 2014-09-19 NOTE — Telephone Encounter (Signed)
Was given diagnosis of asthma in April. With inhalers and breathing machine, she is no better. When she coughs, she wets her pants She is still struggling to breathe Please advise

## 2014-09-19 NOTE — Telephone Encounter (Signed)
Return call to patient regarding asthma.  Pt stated the inhalers, nebulizer, no the cough medications are helping her.  She get so SOB all the time.  Pt stated she is producing a lot of thick yellow mucous.  The coughing is worse at night and when she wait up in the morning she could cough for about 30 minutes.  Pt have tried several over the counter medications with no relief.  Advised patient that she should be seen by a provider if she thinks her Asthma is worse.  Appt tomorrow with Dr. Lorenso Courier.  Will forward to PCP.  Derl Barrow, RN

## 2014-09-20 ENCOUNTER — Ambulatory Visit (INDEPENDENT_AMBULATORY_CARE_PROVIDER_SITE_OTHER): Payer: BC Managed Care – PPO | Admitting: Family Medicine

## 2014-09-20 ENCOUNTER — Encounter: Payer: Self-pay | Admitting: Family Medicine

## 2014-09-20 VITALS — BP 130/72 | HR 97 | Temp 98.3°F | Resp 24 | Wt 380.0 lb

## 2014-09-20 DIAGNOSIS — R059 Cough, unspecified: Secondary | ICD-10-CM | POA: Insufficient documentation

## 2014-09-20 DIAGNOSIS — R05 Cough: Secondary | ICD-10-CM

## 2014-09-20 DIAGNOSIS — E118 Type 2 diabetes mellitus with unspecified complications: Secondary | ICD-10-CM | POA: Diagnosis not present

## 2014-09-20 MED ORDER — IPRATROPIUM-ALBUTEROL 0.5-2.5 (3) MG/3ML IN SOLN: 3.0000 mL | Freq: Once | RESPIRATORY_TRACT | Status: DC

## 2014-09-20 MED ORDER — METHYLPREDNISOLONE ACETATE 80 MG/ML IJ SUSP: 240.0000 mg | Freq: Once | INTRAMUSCULAR | Status: DC

## 2014-09-20 NOTE — Progress Notes (Signed)
Patient ID: [name], female   DOB: 04-Oct-1970, 44 y.o.   MRN: [id number]    Subjective: CC:f/u cough/asthma HPI: Patient is a 44 y.o. female with a past medical history of allergic rhinitis and asthma (recently diagnosed),  presenting to clinic today for f/u on cough/asthma   COUGH  Has been coughing since March (was diagnosed by PCP in April, no PFTs done) Cough is: getting worse and productive of yellow phlegm Has had syncopal episodes with forceful coughing- approximately 30-60 seconds of "blanking out." Per husband, her eyes stay up but she seems to be staring off and cannot respond, she then becomes more alert.   Medications tried: albuterol inhaler and nebulizer: she's been using her inhaler every 2hrs for the last 2 weeks per an EDP that she called, however never was evaluated and did not go to be seen. Has also tried Delsym, Mucinex, cough drops. Has not had any improvement in symptoms Taking blood pressure medications: Yes- amlodipine Cough is most severe in the AM and PM.   Symptoms Runny nose: No  Mucous in back of throat: yes  Throat burning or reflux: No  Wheezing or asthma: yes (wheezing) Fever: no  Chest Pain: wall pain from coughing Shortness of breath: Yes, all the time. She feels like she's breathing through a straw.  Leg swelling: no  Hemoptysis: no  Weight loss: no.   Patient tells me she's never had asthma since her diagnosis in April, however it noted in April's note to have had intermittent asthma in the past.   Also endorses PND and requiring more pillows (3) in the recent future.   Social History: Second hand smoke exposure, never smoker  ROS: All other systems reviewed and are negative.  Past Medical History Patient Active Problem List   Diagnosis Date Noted  . Cough 09/20/2014  . Asthma with acute exacerbation 06/22/2014  . Grief at loss of child 06/22/2014  . Viral gastroenteritis 02/24/2014  . Dysmenorrhea 11/25/2013  . Frequent  urination 10/20/2012  . Furuncle 07/07/2012  . Contact dermatitis 10/03/2011  . Suicidal ideation 05/27/2011  . Hip pain, right 03/13/2011  . Chronic kidney disease (CKD), stage III (moderate) 11/06/2010  . ENDOMETRIAL HYPERPLASIA UNSPECIFIED 02/07/2008  . MENORRHAGIA 01/03/2008  . Allergic rhinitis 06/24/2007  . Essential hypertension, benign 02/03/2007  . Hypothyroidism 05/07/2006  . Type II diabetes mellitus with complication 46/56/8127  . Morbid obesity 05/07/2006  . ANEMIA, IRON DEFICIENCY, UNSPEC. 05/07/2006  . Major depressive disorder, recurrent episode 05/07/2006  . Anxiety state 05/07/2006  . OBSESSIVE COMPUL. DISORDER 05/07/2006    Medications- reviewed and updated Current Outpatient Prescriptions  Medication Sig Dispense Refill  . albuterol (PROVENTIL) (2.5 MG/3ML) 0.083% nebulizer solution Take 3 mLs (2.5 mg total) by nebulization every 4 (four) hours as needed for wheezing or shortness of breath. 150 mL 3  . amLODipine (NORVASC) 10 MG tablet Take 1 tablet (10 mg total) by mouth daily. 30 tablet 5  . Blood Glucose Monitoring Suppl (ONE TOUCH ULTRA SYSTEM KIT) W/DEVICE KIT Check CBG's once daily and prn.  Please substitute glucometer and lancets per formulary. 1 each 0  . busPIRone (BUSPAR) 10 MG tablet Take 10 mg by mouth 2 (two) times daily.    . cetirizine (ZYRTEC) 10 MG tablet Take 1 tablet (10 mg total) by mouth daily. 30 tablet 11  . cloNIDine (CATAPRES) 0.1 MG tablet Take 0.1 mg by mouth as needed (anxiety).    Marland Kitchen FLUoxetine (PROZAC) 40 MG capsule Take 80 mg  by mouth daily.     Marland Kitchen glimepiride (AMARYL) 2 MG tablet Take 2 tablets (4 mg total) by mouth daily before breakfast. 180 tablet 3  . glucose blood (ONE TOUCH ULTRA TEST) test strip Check sugar every morning and 2 hours after your biggest meal of the day 100 each 12  . Lancets (ACCU-CHEK MULTICLIX) lancets Use as instructed 100 each 12  . levothyroxine (SYNTHROID, LEVOTHROID) 300 MCG tablet Take 1 tablet (300 mcg  total) by mouth daily. 90 tablet 3  . LORazepam (ATIVAN) 1 MG tablet Take 1 tablet (1 mg total) by mouth 3 (three) times daily as needed for anxiety. Do not fill before 30 days after last refill. 90 tablet 0  . LORazepam (ATIVAN) 1 MG tablet Take 1 tablet (1 mg total) by mouth 3 (three) times daily as needed for anxiety. Do not fill before 30 days after last refill. 90 tablet 0  . LORazepam (ATIVAN) 1 MG tablet Take 1 tablet (1 mg total) by mouth 3 (three) times daily as needed for anxiety. Do not fill before 30 days after last refill. 90 tablet 0  . traZODone (DESYREL) 100 MG tablet      Current Facility-Administered Medications  Medication Dose Route Frequency Provider Last Rate Last Dose  . ipratropium-albuterol (DUONEB) 0.5-2.5 (3) MG/3ML nebulizer solution 3 mL  3 mL Nebulization Once Archie Patten, MD      . methylPREDNISolone acetate (DEPO-MEDROL) injection 240 mg  240 mg Intramuscular Once Archie Patten, MD        Objective: Office vital signs reviewed. BP 130/72 mmHg  Pulse 97  Temp(Src) 98.3 F (36.8 C) (Oral)  Resp 24  Wt 380 lb (172.367 kg)  SpO2 100%   Physical Examination:  General: Awake, alert, well nourished, in moderate distress with coughing. ENMT:  TMs intact, normal light reflex, no erythema, no bulging. Nasal turbinates moist. MMM, Oropharynx clear without erythema or tonsillar exudate/hypertrophy Eyes: Conjunctiva non-injected. PERRL.  Cardio: RRR, no m/r/g noted. 1+ pitting edema noted bilaterally, more prominent on the L>R.  Pulm: Mild increased WOB. Initially, loud coarse transmitted upper airway noises transmitted throughout, then inspiratory and expiratory wheezing noted with normal breathing. Forceful productive cough noted. 100% on RA.  Skin: dry, intact, no rashes or lesions   Assessment/Plan: Cough Per the patient's history today, she denies any previous diagnosis of asthma during her childhood. It would be odd to have adult onset asthma. Given  her secondary smoke exposure, she could have a component of COPD. Her syncopal episodes with coughing aremost consistent with a vasovagal response. We discussed a shot of Solu-Medrol and DuoNeb in the clinic to monitor her response with possible transfer to the emergency room if there was no improvement. Unfortunately, the patient left AMA prior to having these done despite being told that she may need to go to emergency room given her symptoms and overall state. She is aware that if her breathing continues to worsen, she should go to the emergency room. --In the futre, the patient could benefit from possible systemic steroids and DuoNeb treatments given concerns of COPD -In the future, once she is out of the acute phase she would really benefit from PFTs to further characterize her breathing -Given the lower extremity edema, she may benefit from a BNP and echocardiogram, as one was not noted to be done in the past -She may also benefit from a outpatient sleep study given her proximal nocturnal dyspnea and 3 pillow orthopnea.  No orders of the defined types were placed in this encounter.    Meds ordered this encounter  Medications  . ipratropium-albuterol (DUONEB) 0.5-2.5 (3) MG/3ML nebulizer solution 3 mL    Sig:   . methylPREDNISolone acetate (DEPO-MEDROL) injection 240 mg    Sig:     Archie Patten PGY-2, [place]

## 2014-09-20 NOTE — Progress Notes (Signed)
Pt reports she needed to leave before 1100.... She walked out of the room at 1035 and stated that she had to leave now b/c her job needed her... Notified Dr. Lorenso Courier that pt left w/o administering meds.

## 2014-09-20 NOTE — Assessment & Plan Note (Signed)
Per the patient's history today, she denies any previous diagnosis of asthma during her childhood. It would be odd to have adult onset asthma. Given her secondary smoke exposure, she could have a component of COPD. Her syncopal episodes with coughing aremost consistent with a vasovagal response. We discussed a shot of Solu-Medrol and DuoNeb in the clinic to monitor her response with possible transfer to the emergency room if there was no improvement. Unfortunately, the patient left AMA prior to having these done despite being told that she may need to go to emergency room given her symptoms and overall state. She is aware that if her breathing continues to worsen, she should go to the emergency room. --In the futre, the patient could benefit from possible systemic steroids and DuoNeb treatments given concerns of COPD -In the future, once she is out of the acute phase she would really benefit from PFTs to further characterize her breathing -Given the lower extremity edema, she may benefit from a BNP and echocardiogram, as one was not noted to be done in the past -She may also benefit from a outpatient sleep study given her proximal nocturnal dyspnea and 3 pillow orthopnea.

## 2014-09-22 ENCOUNTER — Encounter (HOSPITAL_COMMUNITY): Payer: Self-pay | Admitting: Emergency Medicine

## 2014-09-22 ENCOUNTER — Emergency Department (HOSPITAL_COMMUNITY): Payer: BC Managed Care – PPO

## 2014-09-22 ENCOUNTER — Inpatient Hospital Stay (HOSPITAL_COMMUNITY)
Admission: EM | Admit: 2014-09-22 | Discharge: 2014-09-24 | DRG: 204 | Disposition: A | Discharge status: Home / Self Care | Payer: BC Managed Care – PPO | Attending: Family Medicine | Admitting: Family Medicine

## 2014-09-22 ENCOUNTER — Other Ambulatory Visit: Payer: Self-pay

## 2014-09-22 DIAGNOSIS — F4323 Adjustment disorder with mixed anxiety and depressed mood: Secondary | ICD-10-CM | POA: Diagnosis not present

## 2014-09-22 DIAGNOSIS — Z91040 Latex allergy status: Secondary | ICD-10-CM

## 2014-09-22 DIAGNOSIS — I129 Hypertensive chronic kidney disease with stage 1 through stage 4 chronic kidney disease, or unspecified chronic kidney disease: Secondary | ICD-10-CM | POA: Diagnosis present

## 2014-09-22 DIAGNOSIS — D509 Iron deficiency anemia, unspecified: Secondary | ICD-10-CM | POA: Diagnosis present

## 2014-09-22 DIAGNOSIS — R0603 Acute respiratory distress: Secondary | ICD-10-CM | POA: Diagnosis present

## 2014-09-22 DIAGNOSIS — N183 Chronic kidney disease, stage 3 unspecified: Secondary | ICD-10-CM | POA: Diagnosis present

## 2014-09-22 DIAGNOSIS — R0600 Dyspnea, unspecified: Secondary | ICD-10-CM | POA: Diagnosis present

## 2014-09-22 DIAGNOSIS — F411 Generalized anxiety disorder: Secondary | ICD-10-CM | POA: Diagnosis present

## 2014-09-22 DIAGNOSIS — I10 Essential (primary) hypertension: Secondary | ICD-10-CM | POA: Diagnosis not present

## 2014-09-22 DIAGNOSIS — J45909 Unspecified asthma, uncomplicated: Secondary | ICD-10-CM | POA: Diagnosis present

## 2014-09-22 DIAGNOSIS — Z6841 Body Mass Index (BMI) 40.0 and over, adult: Secondary | ICD-10-CM

## 2014-09-22 DIAGNOSIS — Z9049 Acquired absence of other specified parts of digestive tract: Secondary | ICD-10-CM | POA: Diagnosis present

## 2014-09-22 DIAGNOSIS — K219 Gastro-esophageal reflux disease without esophagitis: Secondary | ICD-10-CM | POA: Diagnosis present

## 2014-09-22 DIAGNOSIS — E118 Type 2 diabetes mellitus with unspecified complications: Secondary | ICD-10-CM

## 2014-09-22 DIAGNOSIS — Z888 Allergy status to other drugs, medicaments and biological substances status: Secondary | ICD-10-CM | POA: Diagnosis not present

## 2014-09-22 DIAGNOSIS — R0602 Shortness of breath: Secondary | ICD-10-CM | POA: Diagnosis present

## 2014-09-22 DIAGNOSIS — F329 Major depressive disorder, single episode, unspecified: Secondary | ICD-10-CM | POA: Diagnosis present

## 2014-09-22 DIAGNOSIS — R55 Syncope and collapse: Secondary | ICD-10-CM | POA: Diagnosis present

## 2014-09-22 DIAGNOSIS — E1122 Type 2 diabetes mellitus with diabetic chronic kidney disease: Secondary | ICD-10-CM | POA: Diagnosis present

## 2014-09-22 DIAGNOSIS — R05 Cough: Secondary | ICD-10-CM | POA: Diagnosis not present

## 2014-09-22 DIAGNOSIS — R062 Wheezing: Secondary | ICD-10-CM | POA: Insufficient documentation

## 2014-09-22 DIAGNOSIS — J80 Acute respiratory distress syndrome: Secondary | ICD-10-CM | POA: Diagnosis not present

## 2014-09-22 DIAGNOSIS — J45901 Unspecified asthma with (acute) exacerbation: Secondary | ICD-10-CM | POA: Diagnosis not present

## 2014-09-22 DIAGNOSIS — N1832 Chronic kidney disease, stage 3b: Secondary | ICD-10-CM | POA: Diagnosis present

## 2014-09-22 DIAGNOSIS — E039 Hypothyroidism, unspecified: Secondary | ICD-10-CM | POA: Diagnosis present

## 2014-09-22 DIAGNOSIS — Z9851 Tubal ligation status: Secondary | ICD-10-CM

## 2014-09-22 DIAGNOSIS — J4541 Moderate persistent asthma with (acute) exacerbation: Secondary | ICD-10-CM

## 2014-09-22 DIAGNOSIS — N189 Chronic kidney disease, unspecified: Secondary | ICD-10-CM | POA: Diagnosis present

## 2014-09-22 DIAGNOSIS — R053 Chronic cough: Secondary | ICD-10-CM | POA: Insufficient documentation

## 2014-09-22 DIAGNOSIS — F332 Major depressive disorder, recurrent severe without psychotic features: Secondary | ICD-10-CM

## 2014-09-22 HISTORY — DX: Essential (primary) hypertension: I10

## 2014-09-22 LAB — GLUCOSE, CAPILLARY
GLUCOSE-CAPILLARY: 321 mg/dL — AB (ref 65–99)
Glucose-Capillary: 297 mg/dL — ABNORMAL HIGH (ref 65–99)

## 2014-09-22 LAB — CBC WITH DIFFERENTIAL/PLATELET
BASOS ABS: 0.1 10*3/uL (ref 0.0–0.1)
Basophils Relative: 1 % (ref 0–1)
EOS PCT: 4 % (ref 0–5)
Eosinophils Absolute: 0.4 10*3/uL (ref 0.0–0.7)
HEMATOCRIT: 29.1 % — AB (ref 36.0–46.0)
Hemoglobin: 8.5 g/dL — ABNORMAL LOW (ref 12.0–15.0)
LYMPHS ABS: 3.8 10*3/uL (ref 0.7–4.0)
LYMPHS PCT: 37 % (ref 12–46)
MCH: 21.7 pg — ABNORMAL LOW (ref 26.0–34.0)
MCHC: 29.2 g/dL — AB (ref 30.0–36.0)
MCV: 74.4 fL — AB (ref 78.0–100.0)
MONOS PCT: 4 % (ref 3–12)
Monocytes Absolute: 0.4 10*3/uL (ref 0.1–1.0)
NEUTROS ABS: 5.5 10*3/uL (ref 1.7–7.7)
NEUTROS PCT: 54 % (ref 43–77)
PLATELETS: 526 10*3/uL — AB (ref 150–400)
RBC: 3.91 MIL/uL (ref 3.87–5.11)
RDW: 17.4 % — ABNORMAL HIGH (ref 11.5–15.5)
WBC: 10.2 10*3/uL (ref 4.0–10.5)

## 2014-09-22 LAB — BASIC METABOLIC PANEL
Anion gap: 10 (ref 5–15)
BUN: 14 mg/dL (ref 6–20)
CO2: 23 mmol/L (ref 22–32)
CREATININE: 1.57 mg/dL — AB (ref 0.44–1.00)
Calcium: 8.5 mg/dL — ABNORMAL LOW (ref 8.9–10.3)
Chloride: 102 mmol/L (ref 101–111)
GFR calc Af Amer: 45 mL/min — ABNORMAL LOW (ref >60)
GFR calc non Af Amer: 39 mL/min — ABNORMAL LOW (ref >60)
Glucose, Bld: 207 mg/dL — ABNORMAL HIGH (ref 65–99)
POTASSIUM: 3.8 mmol/L (ref 3.5–5.1)
Sodium: 135 mmol/L (ref 135–145)

## 2014-09-22 LAB — I-STAT TROPONIN, ED: Troponin i, poc: 0 ng/mL (ref 0.00–0.08)

## 2014-09-22 LAB — BRAIN NATRIURETIC PEPTIDE: B Natriuretic Peptide: 10.2 pg/mL (ref 0.0–100.0)

## 2014-09-22 LAB — TROPONIN I: Troponin I: <0.03 ng/mL (ref <0.031)

## 2014-09-22 LAB — SEDIMENTATION RATE: Sed Rate: 49 mm/hr — ABNORMAL HIGH (ref 0–22)

## 2014-09-22 LAB — FERRITIN: Ferritin: 4 ng/mL — ABNORMAL LOW (ref 11–307)

## 2014-09-22 LAB — D-DIMER, QUANTITATIVE (NOT AT ARMC)

## 2014-09-22 LAB — C-REACTIVE PROTEIN: CRP: 4.9 mg/dL — AB (ref <1.0)

## 2014-09-22 MED ORDER — MORPHINE SULFATE 2 MG/ML IJ SOLN
1.0000 mg | Freq: Once | INTRAMUSCULAR | Status: AC
  Administered 2014-09-22: 1 mg via INTRAVENOUS
  Filled 2014-09-22: qty 1

## 2014-09-22 MED ORDER — BENZONATATE 100 MG PO CAPS
100.0000 mg | ORAL_CAPSULE | Freq: Two times a day (BID) | ORAL | Status: DC
  Administered 2014-09-22 – 2014-09-24 (×5): 100 mg via ORAL
  Filled 2014-09-22 (×6): qty 1

## 2014-09-22 MED ORDER — PANTOPRAZOLE SODIUM 40 MG PO TBEC
40.0000 mg | DELAYED_RELEASE_TABLET | Freq: Two times a day (BID) | ORAL | Status: DC
  Administered 2014-09-22 – 2014-09-24 (×5): 40 mg via ORAL
  Filled 2014-09-22 (×5): qty 1

## 2014-09-22 MED ORDER — SODIUM CHLORIDE 0.9 % IV SOLN: 250.0000 mL | INTRAVENOUS | Status: DC | PRN

## 2014-09-22 MED ORDER — TRAZODONE HCL 150 MG PO TABS
150.0000 mg | ORAL_TABLET | Freq: Every day | ORAL | Status: DC
  Administered 2014-09-22 – 2014-09-23 (×2): 150 mg via ORAL
  Filled 2014-09-22: qty 1
  Filled 2014-09-22: qty 3
  Filled 2014-09-22 (×2): qty 1

## 2014-09-22 MED ORDER — SODIUM CHLORIDE 0.9 % IJ SOLN: 3.0000 mL | INTRAMUSCULAR | Status: DC | PRN

## 2014-09-22 MED ORDER — IPRATROPIUM-ALBUTEROL 0.5-2.5 (3) MG/3ML IN SOLN
3.0000 mL | RESPIRATORY_TRACT | Status: DC
  Administered 2014-09-22: 3 mL via RESPIRATORY_TRACT
  Filled 2014-09-22: qty 3

## 2014-09-22 MED ORDER — HEPARIN SODIUM (PORCINE) 5000 UNIT/ML IJ SOLN
5000.0000 [IU] | Freq: Three times a day (TID) | INTRAMUSCULAR | Status: DC
  Administered 2014-09-22 – 2014-09-24 (×6): 5000 [IU] via SUBCUTANEOUS
  Filled 2014-09-22 (×7): qty 1

## 2014-09-22 MED ORDER — GLIMEPIRIDE 4 MG PO TABS
4.0000 mg | ORAL_TABLET | Freq: Every day | ORAL | Status: DC
  Administered 2014-09-23 – 2014-09-24 (×2): 4 mg via ORAL
  Filled 2014-09-22 (×3): qty 1

## 2014-09-22 MED ORDER — IPRATROPIUM-ALBUTEROL 0.5-2.5 (3) MG/3ML IN SOLN
3.0000 mL | Freq: Four times a day (QID) | RESPIRATORY_TRACT | Status: DC
  Administered 2014-09-22 – 2014-09-23 (×4): 3 mL via RESPIRATORY_TRACT
  Filled 2014-09-22 (×5): qty 3

## 2014-09-22 MED ORDER — LEVOTHYROXINE SODIUM 150 MCG PO TABS
300.0000 ug | ORAL_TABLET | Freq: Every day | ORAL | Status: DC
  Administered 2014-09-22 – 2014-09-24 (×3): 300 ug via ORAL
  Filled 2014-09-22 (×4): qty 2

## 2014-09-22 MED ORDER — SODIUM CHLORIDE 0.9 % IJ SOLN
3.0000 mL | Freq: Two times a day (BID) | INTRAMUSCULAR | Status: DC
  Administered 2014-09-22 – 2014-09-24 (×4): 3 mL via INTRAVENOUS

## 2014-09-22 MED ORDER — LORATADINE 10 MG PO TABS
10.0000 mg | ORAL_TABLET | Freq: Every day | ORAL | Status: DC
  Administered 2014-09-22 – 2014-09-24 (×3): 10 mg via ORAL
  Filled 2014-09-22 (×3): qty 1

## 2014-09-22 MED ORDER — PREDNISONE 50 MG PO TABS
50.0000 mg | ORAL_TABLET | Freq: Every day | ORAL | Status: DC
  Administered 2014-09-23 – 2014-09-24 (×2): 50 mg via ORAL
  Filled 2014-09-22 (×3): qty 1

## 2014-09-22 MED ORDER — ALBUTEROL SULFATE (2.5 MG/3ML) 0.083% IN NEBU
2.5000 mg | INHALATION_SOLUTION | RESPIRATORY_TRACT | Status: DC | PRN
  Administered 2014-09-22: 2.5 mg via RESPIRATORY_TRACT
  Filled 2014-09-22 (×2): qty 3

## 2014-09-22 MED ORDER — IPRATROPIUM-ALBUTEROL 0.5-2.5 (3) MG/3ML IN SOLN
3.0000 mL | Freq: Once | RESPIRATORY_TRACT | Status: AC
  Administered 2014-09-22: 3 mL via RESPIRATORY_TRACT
  Filled 2014-09-22: qty 3

## 2014-09-22 MED ORDER — ACETAMINOPHEN 325 MG PO TABS
650.0000 mg | ORAL_TABLET | Freq: Four times a day (QID) | ORAL | Status: DC | PRN
  Administered 2014-09-22: 650 mg via ORAL
  Filled 2014-09-22: qty 2

## 2014-09-22 MED ORDER — LORAZEPAM 1 MG PO TABS
1.0000 mg | ORAL_TABLET | Freq: Three times a day (TID) | ORAL | Status: DC | PRN
  Administered 2014-09-22 – 2014-09-23 (×3): 1 mg via ORAL
  Filled 2014-09-22 (×3): qty 1

## 2014-09-22 MED ORDER — DM-GUAIFENESIN ER 30-600 MG PO TB12
1.0000 | ORAL_TABLET | Freq: Two times a day (BID) | ORAL | Status: DC
  Administered 2014-09-22 – 2014-09-23 (×2): 1 via ORAL
  Filled 2014-09-22 (×3): qty 1

## 2014-09-22 MED ORDER — FERROUS SULFATE 325 (65 FE) MG PO TABS
325.0000 mg | ORAL_TABLET | Freq: Two times a day (BID) | ORAL | Status: DC
  Administered 2014-09-22 – 2014-09-24 (×4): 325 mg via ORAL
  Filled 2014-09-22 (×6): qty 1

## 2014-09-22 MED ORDER — BUSPIRONE HCL 10 MG PO TABS
10.0000 mg | ORAL_TABLET | Freq: Two times a day (BID) | ORAL | Status: DC
  Administered 2014-09-22 – 2014-09-24 (×5): 10 mg via ORAL
  Filled 2014-09-22 (×6): qty 1

## 2014-09-22 MED ORDER — FLUOXETINE HCL 20 MG PO CAPS
80.0000 mg | ORAL_CAPSULE | Freq: Every day | ORAL | Status: DC
  Administered 2014-09-22 – 2014-09-24 (×3): 80 mg via ORAL
  Filled 2014-09-22 (×3): qty 4

## 2014-09-22 MED ORDER — SODIUM CHLORIDE 0.9 % IJ SOLN: 3.0000 mL | Freq: Two times a day (BID) | INTRAMUSCULAR | Status: DC

## 2014-09-22 MED ORDER — PNEUMOCOCCAL VAC POLYVALENT 25 MCG/0.5ML IJ INJ
0.5000 mL | INJECTION | INTRAMUSCULAR | Status: AC
  Administered 2014-09-23: 0.5 mL via INTRAMUSCULAR
  Filled 2014-09-22: qty 0.5

## 2014-09-22 MED ORDER — INSULIN ASPART 100 UNIT/ML ~~LOC~~ SOLN
0.0000 [IU] | SUBCUTANEOUS | Status: DC
  Administered 2014-09-22: 16 [IU] via SUBCUTANEOUS
  Administered 2014-09-22: 12 [IU] via SUBCUTANEOUS
  Administered 2014-09-23: 8 [IU] via SUBCUTANEOUS
  Administered 2014-09-23 (×2): 12 [IU] via SUBCUTANEOUS
  Administered 2014-09-23 (×3): 2 [IU] via SUBCUTANEOUS

## 2014-09-22 NOTE — ED Notes (Signed)
Pt in EMS C/O SOB for past several days. SOB with exertion. Went to dr recently and noted to have pitting edema. Pt exp. Wheezes as well as rales. givecn 2 duoneb, 125 solumedrol and 2mg  mag

## 2014-09-22 NOTE — ED Notes (Signed)
Pt. Returned from XR

## 2014-09-22 NOTE — ED Notes (Signed)
Pt chest pain 5/10 and not relieved with breathing tx.  Dr Tawnya Crook notified.

## 2014-09-22 NOTE — H&P (Signed)
[facility] Admission History and Physical Service Pager: [phone]  Patient name: [name] Medical record number: [id number] Date of birth: December 09, 1970 Age: 44 y.o. Gender: female  Primary Care Provider: Lavon Paganini, MD Consultants: none  Code Status: Full   Chief Complaint: SOB   Assessment and Plan: [name] is a 44 y.o. female presenting with respiratory distress. PMH is significant for HTN, DM2, Anxiety,   Respiratory distress: reported as adult onset asthma. She was seen in the office on 7/13 with similar complaints but had to leave the office before receiving any treatment. No PFT's done at this point but having episdoes of syncope with coughing fits. Husband is a smoker so could be a component of COPD. D-Dimer negative and BNP low. Son passed away in his sleep with no foul play. Mother had lupus.  - admit to telemetry, Dr. McDiarmid attending  - duoneb Q2, Q4 PRN  - received Mg in ED  - Solu-medrol in ED, start prednisone 50 mg daily  - pulse ox, >O2 90-95  - ESR, CRP, ANA, RF  - if worsening change to Q1, Q2 PRN, move to step down and ABG   LOC w/ coughing: reports that she has been having episodes two times per day the last two weeks. Was having intermittently prior to that. These episodes started in April. May be related to coughing.  BNP and d-dimer normal.  EKG showing sinus tachycardia and QT at 461. - telemetry - consider ECHO  - Repeat EKG in a.m. - Cycle troponins 2  HEME: Microcytic anemia/Thrombocytosis: hgb baseline around 9. No iron on home med lists. Platelets  consistently elevated in 400-600's. Polychromasia observed on morphology and smear in 2012 showing large platelets. Could be related to her anemia.  - ferritin   HTN: controlled, will hold amlodipine  - hold current medications.   DM2: most recent Hgb A1c 7.0. Taking glimepiride.  - continue glimepiride  - CBG monitoring in AM   Hypothyroidism: taking  synthroid intermittently. Has taken since April due to finances.  - TSH  - care management: medication assistance.   Psych: Anxiety, hx of SI in 2012, took TCA and metformin that required HD for severe AKI. No SI today - continue Ativan TID PRN  - holding clonidine  - avoid metformin and TCA's   Morbid Obesity: weight 380 lbs  - consider nutrition c/s   FEN/GI: Carb Mod/ Heart healthy/  Prophylaxis: SubQ  Disposition: admitted to [facility] for resp distress for further treatment   History of Present Illness: [name] is a 44 y.o. female presenting with respiratory distress. He woke up at 5 AM this morning and was unable to breathe or take a deep breath. She presented to the ED and this had some relief with the treatment here but she is still wheezing. Her symptoms initially started this past April. Since then they have been getting worse. She was given albuterol and Zyrtec in April with little to no improvement of her symptoms. She reports having a past history of diagnosis of asthma but no PFTs on record. She's been taking the albuterol 2 times per day with no relief. She has coughing during the day to the point where she will pass out. During these episodes of loss of consciousness, she will be out for roughly 30 seconds. During the past week this is been happening 2 times per day. Her shortness of breath and wheezing have been worsening in the past week. She  denies blood with the coughing but does have some production. She is not been given any anti-biotics up to this point.  She denies any fevers, chills, respiratory infections, nausea, vomiting, constipation, diarrhea, or rashes. She denies any travel and no sick contacts. She works as a Optometrist. She denies any history of smoking but her son and husband smoke tobacco. Her mother had a history of lupus. Her son passed away in his sleep this past August.  Review Of Systems: Per HPI with the following additions: See HPI   Otherwise 12 point review of systems was performed and was unremarkable.  Patient Active Problem List   Diagnosis Date Noted  . Cough 09/20/2014  . Asthma with acute exacerbation 06/22/2014  . Grief at loss of child 06/22/2014  . Viral gastroenteritis 02/24/2014  . Dysmenorrhea 11/25/2013  . Frequent urination 10/20/2012  . Furuncle 07/07/2012  . Contact dermatitis 10/03/2011  . Suicidal ideation 05/27/2011  . Hip pain, right 03/13/2011  . Chronic kidney disease (CKD), stage III (moderate) 11/06/2010  . ENDOMETRIAL HYPERPLASIA UNSPECIFIED 02/07/2008  . MENORRHAGIA 01/03/2008  . Allergic rhinitis 06/24/2007  . Essential hypertension, benign 02/03/2007  . Hypothyroidism 05/07/2006  . Type II diabetes mellitus with complication 95/62/1308  . Morbid obesity 05/07/2006  . ANEMIA, IRON DEFICIENCY, UNSPEC. 05/07/2006  . Major depressive disorder, recurrent episode 05/07/2006  . Anxiety state 05/07/2006  . OBSESSIVE COMPUL. DISORDER 05/07/2006   Past Medical History: Past Medical History  Diagnosis Date  . Suicide attempt by drug ingestion 06/05/10    result rhabdomyolosis and ARF requrining dialysis   . Rhabdomyolysis 06/06/10    after drug overdose  . Acute renal failure 05/27/10    hemodialysis for 6 weeks  . Anxiety   . Diabetes mellitus   . Depression   . Hypertension    Past Surgical History: Past Surgical History  Procedure Laterality Date  . Tubal ligation  1998  . Cholecystectomy     Social History: History  Substance Use Topics  . Smoking status: Never Smoker   . Smokeless tobacco: Not on file  . Alcohol Use: No   Additional social history: no tobacco or EtOH  Please also refer to relevant sections of EMR.  Family History: No family history on file. Allergies and Medications: Allergies  Allergen Reactions  . Ace Inhibitors Other (See Comments)    Patient had acute renal failure requiring hemodialysis after a suicide attempt.  Nephro recommended avoiding  use.  . Haldol [Haloperidol Lactate]     Tardive dyskinesia  . Latex   . Metformin And Related Other (See Comments)    Used in her suicide attempt that contributed to severe acute renal failure  . Nsaids Other (See Comments)    Nephro recommended avoiding after acute renal failure requiring hemodialysis associated with a suicide attempt.   No current facility-administered medications on file prior to encounter.   Current Outpatient Prescriptions on File Prior to Encounter  Medication Sig Dispense Refill  . amLODipine (NORVASC) 10 MG tablet Take 1 tablet (10 mg total) by mouth daily. 30 tablet 5  . cetirizine (ZYRTEC) 10 MG tablet Take 1 tablet (10 mg total) by mouth daily. 30 tablet 11  . FLUoxetine (PROZAC) 40 MG capsule Take 80 mg by mouth daily.     Marland Kitchen levothyroxine (SYNTHROID, LEVOTHROID) 300 MCG tablet Take 1 tablet (300 mcg total) by mouth daily. 90 tablet 3  . LORazepam (ATIVAN) 1 MG tablet Take 1 tablet (1 mg total) by mouth 3 (three)  times daily as needed for anxiety. Do not fill before 30 days after last refill. 90 tablet 0  . traZODone (DESYREL) 100 MG tablet Take 150 mg by mouth at bedtime.     Marland Kitchen albuterol (PROVENTIL) (2.5 MG/3ML) 0.083% nebulizer solution Take 3 mLs (2.5 mg total) by nebulization every 4 (four) hours as needed for wheezing or shortness of breath. 150 mL 3  . Blood Glucose Monitoring Suppl (ONE TOUCH ULTRA SYSTEM KIT) W/DEVICE KIT Check CBG's once daily and prn.  Please substitute glucometer and lancets per formulary. 1 each 0  . busPIRone (BUSPAR) 10 MG tablet Take 10 mg by mouth 2 (two) times daily.    . cloNIDine (CATAPRES) 0.1 MG tablet Take 0.1 mg by mouth as needed (anxiety).    Marland Kitchen glimepiride (AMARYL) 2 MG tablet Take 2 tablets (4 mg total) by mouth daily before breakfast. 180 tablet 3  . glucose blood (ONE TOUCH ULTRA TEST) test strip Check sugar every morning and 2 hours after your biggest meal of the day 100 each 12  . Lancets (ACCU-CHEK MULTICLIX)  lancets Use as instructed 100 each 12  . LORazepam (ATIVAN) 1 MG tablet Take 1 tablet (1 mg total) by mouth 3 (three) times daily as needed for anxiety. Do not fill before 30 days after last refill. 90 tablet 0  . LORazepam (ATIVAN) 1 MG tablet Take 1 tablet (1 mg total) by mouth 3 (three) times daily as needed for anxiety. Do not fill before 30 days after last refill. 90 tablet 0    Objective: BP 133/72 mmHg  Pulse 97  Resp 22  SpO2 95% Exam: General: sitting up in bed,  Eyes: normal conjunctiva  ENTM: oropharynx clear,  Neck: no LAD  Cardiovascular: tachycardic, regular rhythm, S1S2, no M/R/G Respiratory: speaking in full sentences, Exp wheezing throughout,  Abdomen: soft, NTND, no HSM  MSK: moves all ext freely, +2 pulses in PT and DP  Skin: warm and well perfused  Neuro: no gross deficits  Psych: no SI   Labs and Imaging: CBC BMET   Recent Labs Lab 09/22/14 0751  WBC 10.2  HGB 8.5*  HCT 29.1*  PLT 526*    Recent Labs Lab 09/22/14 0751  NA 135  K 3.8  CL 102  CO2 23  BUN 14  CREATININE 1.57*  GLUCOSE 207*  CALCIUM 8.5*     CXR: IMPRESSION: No edema or consolidation.   D- dimer: 0.27  Trop: 0.0  BNP: 10.2  Rosemarie Ax, MD 09/22/2014, 9:15 AM PGY-3, [place] Intern pager: [phone], text pages welcome

## 2014-09-22 NOTE — Progress Notes (Signed)
[name] [id number] Admission Data: 09/22/2014 12:11 PM Attending Provider: Blane Ohara McDiarmid, MD  [id number] Samantha Romp, MD Consults/ Treatment Team:    [name] is a 44 y.o. female patient admitted from ED awake, alert  & orientated  X 3,  Full Code, VSS - Blood pressure 149/81, pulse 101, temperature 98.5 F (36.9 C), temperature source Oral, resp. rate 18, height 5\' 10"  (1.778 m), weight 172.367 kg (380 lb), SpO2 92 %., no distress noted. Tele #13placed.  IV site WDL:  SL. Allergies:   Allergies  Allergen Reactions  . Ace Inhibitors Other (See Comments)    Patient had acute renal failure requiring hemodialysis after a suicide attempt.  Nephro recommended avoiding use.  . Haldol [Haloperidol Lactate]     Tardive dyskinesia  . Latex   . Metformin And Related Other (See Comments)    Used in her suicide attempt that contributed to severe acute renal failure  . Nsaids Other (See Comments)    Nephro recommended avoiding after acute renal failure requiring hemodialysis associated with a suicide attempt.     Past Medical History  Diagnosis Date  . Suicide attempt by drug ingestion 06/05/10    result rhabdomyolosis and ARF requrining dialysis   . Rhabdomyolysis 06/06/10    after drug overdose  . Acute renal failure 05/27/10    hemodialysis for 6 weeks  . Anxiety   . Diabetes mellitus   . Depression   . Hypertension       Pt orientation to unit, room and routine. Information packet given to patient/family and safety video watched.  Admission INP armband ID verified with patient/family, and in place. SR up x 2, fall risk assessment complete with Patient and family verbalizing understanding of risks associated with falls. Pt verbalizes an understanding of how to use the call bell and to call for help before getting out of bed.  Skin, clean-dry- intact without evidence of bruising, or skin tears.   No evidence of skin break down noted on exam.    Will cont to monitor and  assist as needed.  Dayle Points, RN 09/22/2014 12:11 PM

## 2014-09-22 NOTE — ED Notes (Signed)
Pt not placed on O2 after nebx per MD.  Pt tolerating well.

## 2014-09-22 NOTE — ED Notes (Signed)
Family practice at bedside.

## 2014-09-22 NOTE — ED Provider Notes (Signed)
CSN: [id number]     Arrival date & time 09/22/14  3007 History   First MD Initiated Contact with Patient 09/22/14 [phone]     Chief Complaint  Patient presents with  . Respiratory Distress     (Consider location/radiation/quality/duration/timing/severity/associated sxs/prior Treatment) Patient is a 44 y.o. female presenting with shortness of breath. The history is provided by the patient. No language interpreter was used.  Shortness of Breath Severity:  Severe Onset quality:  Gradual Duration:  1 month Timing:  Constant Progression:  Worsening Chronicity:  New Context: activity   Relieved by:  Rest Worsened by:  Movement and exertion Ineffective treatments:  None tried Associated symptoms: cough, sputum production and wheezing   Associated symptoms: no abdominal pain, no chest pain, no diaphoresis, no fever, no headaches, no neck pain, no sore throat and no vomiting   Risk factors: obesity   Risk factors: no recent alcohol use, no family hx of DVT, no hx of cancer, no hx of PE/DVT, no oral contraceptive use, no prolonged immobilization and no tobacco use     Past Medical History  Diagnosis Date  . Suicide attempt by drug ingestion 06/05/10    result rhabdomyolosis and ARF requrining dialysis   . Rhabdomyolysis 06/06/10    after drug overdose  . Acute renal failure 05/27/10    hemodialysis for 6 weeks  . Anxiety   . Diabetes mellitus   . Depression   . Hypertension    Past Surgical History  Procedure Laterality Date  . Tubal ligation  1998  . Cholecystectomy     No family history on file. History  Substance Use Topics  . Smoking status: Never Smoker   . Smokeless tobacco: Not on file  . Alcohol Use: No   OB History    No data available     Review of Systems  Constitutional: Negative for fever, chills, diaphoresis, activity change, appetite change and fatigue.  HENT: Negative for congestion, facial swelling, rhinorrhea and sore throat.   Eyes: Negative for  photophobia and discharge.  Respiratory: Positive for cough, sputum production, shortness of breath and wheezing. Negative for chest tightness.   Cardiovascular: Negative for chest pain, palpitations and leg swelling.  Gastrointestinal: Negative for nausea, vomiting, abdominal pain and diarrhea.  Endocrine: Negative for polydipsia and polyuria.  Genitourinary: Negative for dysuria, frequency, difficulty urinating and pelvic pain.  Musculoskeletal: Negative for back pain, arthralgias, neck pain and neck stiffness.  Skin: Negative for color change and wound.  Allergic/Immunologic: Negative for immunocompromised state.  Neurological: Negative for facial asymmetry, weakness, numbness and headaches.  Hematological: Does not bruise/bleed easily.  Psychiatric/Behavioral: Negative for confusion and agitation.      Allergies  Ace inhibitors; Haldol; Latex; Metformin and related; and Nsaids  Home Medications   Prior to Admission medications   Medication Sig Start Date End Date Taking? Authorizing Provider  albuterol (PROVENTIL HFA;VENTOLIN HFA) 108 (90 BASE) MCG/ACT inhaler Inhale 1-2 puffs into the lungs every 6 (six) hours as needed for wheezing or shortness of breath.   Yes Historical Provider, MD  amLODipine (NORVASC) 10 MG tablet Take 1 tablet (10 mg total) by mouth daily. 02/24/14  Yes Virginia Crews, MD  cetirizine (ZYRTEC) 10 MG tablet Take 1 tablet (10 mg total) by mouth daily. 06/22/14  Yes Virginia Crews, MD  FLUoxetine (PROZAC) 40 MG capsule Take 80 mg by mouth daily.    Yes Historical Provider, MD  levothyroxine (SYNTHROID, LEVOTHROID) 300 MCG tablet Take 1 tablet (300 mcg total)  by mouth daily. 08/11/13  Yes Melony Overly, MD  LORazepam (ATIVAN) 1 MG tablet Take 1 tablet (1 mg total) by mouth 3 (three) times daily as needed for anxiety. Do not fill before 30 days after last refill. 08/29/14  Yes Virginia Crews, MD  traZODone (DESYREL) 100 MG tablet Take 150 mg by mouth at  bedtime.  02/26/13  Yes Historical Provider, MD  albuterol (PROVENTIL) (2.5 MG/3ML) 0.083% nebulizer solution Take 3 mLs (2.5 mg total) by nebulization every 4 (four) hours as needed for wheezing or shortness of breath. 06/22/14   Virginia Crews, MD  Blood Glucose Monitoring Suppl (ONE TOUCH ULTRA SYSTEM KIT) W/DEVICE KIT Check CBG's once daily and prn.  Please substitute glucometer and lancets per formulary. 04/27/12   Katherina Mires, MD  busPIRone (BUSPAR) 10 MG tablet Take 10 mg by mouth 2 (two) times daily.    Historical Provider, MD  cloNIDine (CATAPRES) 0.1 MG tablet Take 0.1 mg by mouth as needed (anxiety).    Historical Provider, MD  glimepiride (AMARYL) 2 MG tablet Take 2 tablets (4 mg total) by mouth daily before breakfast. 08/11/13   Melony Overly, MD  glucose blood (ONE TOUCH ULTRA TEST) test strip Check sugar every morning and 2 hours after your biggest meal of the day 01/14/12   Melony Overly, MD  Lancets (ACCU-CHEK MULTICLIX) lancets Use as instructed 01/14/12   Melony Overly, MD  LORazepam (ATIVAN) 1 MG tablet Take 1 tablet (1 mg total) by mouth 3 (three) times daily as needed for anxiety. Do not fill before 30 days after last refill. 08/29/14   Virginia Crews, MD  LORazepam (ATIVAN) 1 MG tablet Take 1 tablet (1 mg total) by mouth 3 (three) times daily as needed for anxiety. Do not fill before 30 days after last refill. 08/29/14   Virginia Crews, MD   BP 133/72 mmHg  Pulse 97  Resp 22  SpO2 95% Physical Exam  Constitutional: She is oriented to person, place, and time. She appears well-developed and well-nourished. No distress.  HENT:  Head: Normocephalic and atraumatic.  Mouth/Throat: No oropharyngeal exudate.  Eyes: Pupils are equal, round, and reactive to light.  Neck: Normal range of motion. Neck supple.  Cardiovascular: Normal rate, regular rhythm and normal heart sounds.  Exam reveals no gallop and no friction rub.   No murmur heard. Pulmonary/Chest: Effort normal. No  respiratory distress. She has wheezes in the right upper field, the right middle field, the right lower field, the left upper field, the left middle field and the left lower field. She has no rales.  Abdominal: Soft. Bowel sounds are normal. She exhibits no distension and no mass. There is no tenderness. There is no rebound and no guarding.  Musculoskeletal: Normal range of motion. She exhibits no edema or tenderness.  Neurological: She is alert and oriented to person, place, and time.  Skin: Skin is warm and dry.  Psychiatric: She has a normal mood and affect.    ED Course  Procedures (including critical care time) Labs Review Labs Reviewed  CBC WITH DIFFERENTIAL/PLATELET - Abnormal; Notable for the following:    Hemoglobin 8.5 (*)    HCT 29.1 (*)    MCV 74.4 (*)    MCH 21.7 (*)    MCHC 29.2 (*)    RDW 17.4 (*)    Platelets 526 (*)    All other components within normal limits  BASIC METABOLIC PANEL - Abnormal; Notable for the  following:    Glucose, Bld 207 (*)    Creatinine, Ser 1.57 (*)    Calcium 8.5 (*)    GFR calc non Af Amer 39 (*)    GFR calc Af Amer 45 (*)    All other components within normal limits  BRAIN NATRIURETIC PEPTIDE  D-DIMER, QUANTITATIVE (NOT AT [facility])  Randolm Idol, ED    Imaging Review Dg Chest 2 View  09/22/2014   CLINICAL DATA:  Shortness of breath and cough  EXAM: CHEST  2 VIEW  COMPARISON:  December 18, 2011  FINDINGS: The lungs are clear. The heart size and pulmonary vascularity are normal. No adenopathy. No bone lesions.  IMPRESSION: No edema or consolidation.   Electronically Signed   By: Lowella Grip III M.D.   On: 09/22/2014 07:41     EKG Interpretation None     ED ECG REPORT   Date: 09/22/2014  Rate: 106  Rhythm: sinus tachycardia  QRS Axis: normal  Intervals: normal  ST/T Wave abnormalities: normal  Conduction Disutrbances:none  Narrative Interpretation:   Old EKG Reviewed: none available  I have personally reviewed the EKG  tracing and agree with the computerized printout as noted.  MDM   Final diagnoses:  Acute asthma exacerbation, moderate persistent    Pt is a 44 y.o. female with Pmhx as above who presents with about 1 month of shortness of breath worse with exertion.  She states that she saw an M.D. in the family medicine clinic 2 days ago and noticed some pitting edema to her legs did not think that she had asthma, the patient states she was diagnosed with asthma earlier this summer.  She has had multiple episodes of syncope related to coughing fits including approximately 2 a day over past week she has had central chest pain described as a soreness which is worse with coughing and deep breathing.  She has had productive cough, no fevers or chills. On PE, pt is to With wheezing throughout, she has 1+ bilateral pitting edema.     9 AM.  Patient feeling mildly improved, but lungs sound unchanged.  On physical exam chest x-rays, no acute findings.  BNP, troponin and d-dimer are not elevated.  We'll give a second DuoNeb I feel that given exacerbation is leading to episodes of syncope several times a day admission is warranted. I'll speak to family practice team for amdission.     Ernestina Patches, MD 09/22/14 [phone]

## 2014-09-23 ENCOUNTER — Inpatient Hospital Stay (HOSPITAL_COMMUNITY): Payer: BC Managed Care – PPO

## 2014-09-23 DIAGNOSIS — J80 Acute respiratory distress syndrome: Secondary | ICD-10-CM

## 2014-09-23 DIAGNOSIS — R05 Cough: Secondary | ICD-10-CM | POA: Insufficient documentation

## 2014-09-23 DIAGNOSIS — D509 Iron deficiency anemia, unspecified: Secondary | ICD-10-CM

## 2014-09-23 DIAGNOSIS — R062 Wheezing: Secondary | ICD-10-CM | POA: Insufficient documentation

## 2014-09-23 DIAGNOSIS — R053 Chronic cough: Secondary | ICD-10-CM | POA: Insufficient documentation

## 2014-09-23 DIAGNOSIS — R0603 Acute respiratory distress: Secondary | ICD-10-CM | POA: Insufficient documentation

## 2014-09-23 DIAGNOSIS — N183 Chronic kidney disease, stage 3 (moderate): Secondary | ICD-10-CM

## 2014-09-23 LAB — CBC
HCT: 26.9 % — ABNORMAL LOW (ref 36.0–46.0)
HEMOGLOBIN: 7.8 g/dL — AB (ref 12.0–15.0)
MCH: 21.3 pg — AB (ref 26.0–34.0)
MCHC: 29 g/dL — ABNORMAL LOW (ref 30.0–36.0)
MCV: 73.3 fL — ABNORMAL LOW (ref 78.0–100.0)
PLATELETS: 520 10*3/uL — AB (ref 150–400)
RBC: 3.67 MIL/uL — ABNORMAL LOW (ref 3.87–5.11)
RDW: 17.3 % — ABNORMAL HIGH (ref 11.5–15.5)
WBC: 14.1 10*3/uL — ABNORMAL HIGH (ref 4.0–10.5)

## 2014-09-23 LAB — BASIC METABOLIC PANEL
Anion gap: 4 — ABNORMAL LOW (ref 5–15)
BUN: 18 mg/dL (ref 6–20)
CALCIUM: 8.7 mg/dL — AB (ref 8.9–10.3)
CHLORIDE: 108 mmol/L (ref 101–111)
CO2: 26 mmol/L (ref 22–32)
Creatinine, Ser: 1.75 mg/dL — ABNORMAL HIGH (ref 0.44–1.00)
GFR calc Af Amer: 39 mL/min — ABNORMAL LOW (ref >60)
GFR calc non Af Amer: 34 mL/min — ABNORMAL LOW (ref >60)
Glucose, Bld: 218 mg/dL — ABNORMAL HIGH (ref 65–99)
POTASSIUM: 4.6 mmol/L (ref 3.5–5.1)
Sodium: 138 mmol/L (ref 135–145)

## 2014-09-23 LAB — GLUCOSE, CAPILLARY
GLUCOSE-CAPILLARY: 253 mg/dL — AB (ref 65–99)
Glucose-Capillary: 248 mg/dL — ABNORMAL HIGH (ref 65–99)
Glucose-Capillary: 142 mg/dL — ABNORMAL HIGH (ref 65–99)
Glucose-Capillary: 126 mg/dL — ABNORMAL HIGH (ref 65–99)
Glucose-Capillary: 257 mg/dL — ABNORMAL HIGH (ref 65–99)
Glucose-Capillary: 160 mg/dL — ABNORMAL HIGH (ref 65–99)
Glucose-Capillary: 183 mg/dL — ABNORMAL HIGH (ref 65–99)

## 2014-09-23 LAB — TSH: TSH: 3.399 u[IU]/mL (ref 0.350–4.500)

## 2014-09-23 MED ORDER — INSULIN ASPART 100 UNIT/ML ~~LOC~~ SOLN
0.0000 [IU] | Freq: Three times a day (TID) | SUBCUTANEOUS | Status: DC
  Administered 2014-09-23: 4 [IU] via SUBCUTANEOUS
  Administered 2014-09-24: 2 [IU] via SUBCUTANEOUS

## 2014-09-23 MED ORDER — ALBUTEROL SULFATE (2.5 MG/3ML) 0.083% IN NEBU
2.5000 mg | INHALATION_SOLUTION | RESPIRATORY_TRACT | Status: DC
  Administered 2014-09-23 – 2014-09-24 (×5): 2.5 mg via RESPIRATORY_TRACT
  Filled 2014-09-23 (×5): qty 3

## 2014-09-23 MED ORDER — FLUTICASONE PROPIONATE 50 MCG/ACT NA SUSP
2.0000 | Freq: Every day | NASAL | Status: DC
  Administered 2014-09-23 – 2014-09-24 (×2): 2 via NASAL
  Filled 2014-09-23: qty 16

## 2014-09-23 MED ORDER — GUAIFENESIN-DM 100-10 MG/5ML PO SYRP
5.0000 mL | ORAL_SOLUTION | ORAL | Status: DC | PRN
  Administered 2014-09-23 – 2014-09-24 (×2): 5 mL via ORAL
  Filled 2014-09-23 (×2): qty 10

## 2014-09-23 MED ORDER — TRAMADOL HCL 50 MG PO TABS
50.0000 mg | ORAL_TABLET | Freq: Four times a day (QID) | ORAL | Status: DC | PRN
  Administered 2014-09-23: 50 mg via ORAL
  Filled 2014-09-23: qty 1

## 2014-09-23 NOTE — Progress Notes (Addendum)
Family Medicine Teaching Service Daily Progress Note Intern Pager: [phone]  Patient name: [name] Medical record number: [id number] Date of birth: Mar 11, 1970 Age: 44 y.o. Gender: female  Primary Care Provider: Lavon Paganini, MD Consultants: None Code Status: Full  Pt Overview and Major Events to Date:  7/15 - Admitted with respiratory distress  Assessment and Plan: [name] is a 44 y.o. female presenting with respiratory distress. PMH is significant for HTN, DM2, and anxiety.  Respiratory distress: Broad differential includes adult onset asthma, vocal cord dysfunction, upper airway obstruction, chronic sinusitis, chronic bronchitis, or GERD with laryngospasm. Must also consider auto-immune causes given maternal history of lupus. Currently stable on room air, though continues to have wheezes. No recorded PFTs. CRP and ESR elevated - duoneb four times daily - s/p solumedrol in ED, prednisone 78m daily (7/16- ) - Supplemental oxygen as needed to keep sats >92% - Will obtain CT maxillofacial - Will need spirometry as outpatient - PPI for 2 weeks - Consider pulmonology consult - ANA and RF pending.   Cough-related syncope. reports that she has been having episodes two times per day the last two weeks. Was having intermittently prior to that. These episodes started in April. May be related to coughing. - No events on telemetry - Anticipate resolution with treatment of cough  CKD 3. Cr 1.57 on admission. Baseline 1.5-1.9 - Avoid nephrotoxic medications  Iron Deficiency Anemia. Hgb 8.5 on admission. Baseline around 9. MCV 74. Low iron, ferritin. High/normal TIBC. - Start Fe supplementation.   HTN: controlled, will hold amlodipine  - hold current medications.   DM2: most recent Hgb A1c 7.0. Taking glimepiride.  - continue glimepiride  - CBG monitoring in AM   Hypothyroidism: taking synthroid intermittently. Has taken since April due to finances. TSH wnl.  -  care management: medication assistance.   Psych: Anxiety, hx of SI in 2012, took TCA and metformin that required HD for severe AKI. No SI today - continue Ativan TID PRN  - holding clonidine  - avoid metformin and TCA's   Morbid Obesity: weight 380 lbs  - consider nutrition c/s   FEN/GI: Carb Mod/ Heart healthy/  Prophylaxis: SubQ  Disposition: Admitted to telemetry. Possible discharge home later today pending improvement in respiratory status.  Subjective:  Feels like breathing is a little better this morning. Frustrated about not having an answer, but understands that it will take time. No fevers or chills. No chest pain.   Objective: Temp:  [98 F (36.7 C)-98.5 F (36.9 C)] 98 F (36.7 C) (07/16 0450) Pulse Rate:  [89-110] 89 (07/16 0450) Resp:  [17-24] 18 (07/16 0450) BP: (128-149)/(48-81) 139/66 mmHg (07/16 0450) SpO2:  [92 %-100 %] 99 % (07/16 0730) Weight:  [380 lb (172.367 kg)] 380 lb (172.367 kg) (07/15 1036) Physical Exam: General: Morbidly obese 43yofemale in NAD sitting on hospital bed Cardiovascular: RRR, no murmurs appreciated Respiratory: NWOB, speaking in full sentences. Diffuse wheezes, loudest over trachea. Cough ellicited with deep inspiration. Abdomen: Obese, S, NT, ND Extremities: Trace edema in LE bilaterally, WWP, no cyanosis  Laboratory/Imaging/Diagnostic Studies:  Recent Labs Lab 09/22/14 0751 09/23/14 0520  WBC 10.2 14.1*  HGB 8.5* 7.8*  HCT 29.1* 26.9*  PLT 526* 520*    Recent Labs Lab 09/22/14 0751 09/23/14 0520  NA 135 138  K 3.8 4.6  CL 102 108  CO2 23 26  BUN 14 18  CREATININE 1.57* 1.75*  CALCIUM 8.5* 8.7*  GLUCOSE 207* 218*  Recent Labs Lab 09/22/14 1140 09/22/14 1646  TROPONINI <0.03 <0.03   TSH: 3.39  Vivi Barrack, MD 09/23/2014, 8:56 AM PGY-2, [place] Intern pager: [phone], text pages welcome

## 2014-09-24 ENCOUNTER — Other Ambulatory Visit: Payer: Self-pay | Admitting: Family Medicine

## 2014-09-24 DIAGNOSIS — R0603 Acute respiratory distress: Secondary | ICD-10-CM

## 2014-09-24 DIAGNOSIS — J45901 Unspecified asthma with (acute) exacerbation: Secondary | ICD-10-CM

## 2014-09-24 LAB — GLUCOSE, CAPILLARY
GLUCOSE-CAPILLARY: 160 mg/dL — AB (ref 65–99)
Glucose-Capillary: 96 mg/dL (ref 65–99)

## 2014-09-24 LAB — CBC
HCT: 25.4 % — ABNORMAL LOW (ref 36.0–46.0)
Hemoglobin: 7.4 g/dL — ABNORMAL LOW (ref 12.0–15.0)
MCH: 21.5 pg — ABNORMAL LOW (ref 26.0–34.0)
MCHC: 29.1 g/dL — ABNORMAL LOW (ref 30.0–36.0)
MCV: 73.8 fL — ABNORMAL LOW (ref 78.0–100.0)
Platelets: 434 10*3/uL — ABNORMAL HIGH (ref 150–400)
RBC: 3.44 MIL/uL — ABNORMAL LOW (ref 3.87–5.11)
RDW: 17.8 % — ABNORMAL HIGH (ref 11.5–15.5)
WBC: 13.5 10*3/uL — AB (ref 4.0–10.5)

## 2014-09-24 LAB — BASIC METABOLIC PANEL
Anion gap: 7 (ref 5–15)
BUN: 23 mg/dL — ABNORMAL HIGH (ref 6–20)
CALCIUM: 8.8 mg/dL — AB (ref 8.9–10.3)
CO2: 25 mmol/L (ref 22–32)
Chloride: 105 mmol/L (ref 101–111)
Creatinine, Ser: 1.54 mg/dL — ABNORMAL HIGH (ref 0.44–1.00)
GFR calc Af Amer: 46 mL/min — ABNORMAL LOW (ref >60)
GFR calc non Af Amer: 40 mL/min — ABNORMAL LOW (ref >60)
GLUCOSE: 139 mg/dL — AB (ref 65–99)
POTASSIUM: 4.6 mmol/L (ref 3.5–5.1)
SODIUM: 137 mmol/L (ref 135–145)

## 2014-09-24 LAB — MAGNESIUM: Magnesium: 2 mg/dL (ref 1.7–2.4)

## 2014-09-24 LAB — PHOSPHORUS: PHOSPHORUS: 3.1 mg/dL (ref 2.5–4.6)

## 2014-09-24 MED ORDER — PANTOPRAZOLE SODIUM 40 MG PO TBEC: 40.0000 mg | DELAYED_RELEASE_TABLET | Freq: Two times a day (BID) | ORAL | Status: DC

## 2014-09-24 MED ORDER — FERROUS SULFATE 325 (65 FE) MG PO TABS: 325.0000 mg | ORAL_TABLET | Freq: Two times a day (BID) | ORAL | Status: DC

## 2014-09-24 MED ORDER — FLUTICASONE PROPIONATE 50 MCG/ACT NA SUSP: 2.0000 | Freq: Every day | NASAL | Status: DC

## 2014-09-24 MED ORDER — PREDNISONE 50 MG PO TABS: 50.0000 mg | ORAL_TABLET | Freq: Every day | ORAL | Status: AC

## 2014-09-24 MED ORDER — GUAIFENESIN-DM 100-10 MG/5ML PO SYRP: 5.0000 mL | ORAL_SOLUTION | ORAL | Status: DC | PRN

## 2014-09-24 MED ORDER — BENZONATATE 100 MG PO CAPS: 100.0000 mg | ORAL_CAPSULE | Freq: Two times a day (BID) | ORAL | Status: DC

## 2014-09-24 NOTE — Progress Notes (Signed)
Utilization Review Completed.[name] T7/17/2016  

## 2014-09-24 NOTE — Care Management (Signed)
Patient was on PRN home O2 but, now has continuous O2 needs at 2L. CM called referral for the change, with [facility] spoke with Germaine. Robyn RN on [employer]. Patient will need a tank for discharge. Patient states, No further CM needs identified.

## 2014-09-24 NOTE — Discharge Instructions (Signed)
It is unclear what is causing your wheezing and shortness of breath.  This could be related to asthma, allergies, sinus inflammation, reflux, sleep apnea, or vocal cord dysfunction.  You were started on some new medications.  Please take these as directed.   Please call the clinic and ask to speak to [facility] about financial aid assistance.

## 2014-09-24 NOTE — Discharge Summary (Signed)
[facility] Discharge Summary  Patient name: [name] Medical record number: [id number] Date of birth: 09-17-1970 Age: 44 y.o. Gender: female Date of Admission: 09/22/2014  Date of Discharge: 09/24/2014  Admitting Physician: Blane Ohara McDiarmid, MD  Primary Care Provider: Lavon Paganini, MD Consultants: none  Indication for Hospitalization: respiratory distress  Discharge Diagnoses/Problem List:  Respiratory distress - improved Cough- related syncope CKD 3 Iron deficiency anemia HTN T2DM Hypothyroidism Anxiety H/o suicide attempt Morbid obesity  Disposition: home  Discharge Condition: stable  Discharge Exam:  Filed Vitals:   09/24/14 0441 09/24/14 0614 09/24/14 0723 09/24/14 1203  BP:  113/60    Pulse:  74    Temp:  98.4 F (36.9 C)    TempSrc:  Oral    Resp:  20    Height:      Weight:      SpO2: 100% 99% 95% 96%  General: Morbidly obese 44yo female in NAD sitting on hospital bed Cardiovascular: RRR, no murmurs appreciated Respiratory: NWOB, speaking in full sentences. Diffuse wheezes, loudest over trachea. Cough ellicited with deep inspiration. Abdomen: Obese, S, NT, ND Extremities: Trace edema in LE bilaterally, WWP, no cyanosis  Brief Hospital Course: [name] is a 44 y.o. female presenting with respiratory distress. PMH is significant for HTN, DM2, and anxiety.  Respiratory distress: Patient reporting syndrome of chronic persistent wheezing, sensation of tightness in throat, and persistent dyspnea and cough on admission. Broad differential includes adult onset asthma, vocal cord dysfunction, upper airway obstruction, chronic sinusitis, chronic bronchitis, or GERD with laryngospasm. Patient initially treated for asthma exacerbation with magnesium, prednisone, nebulizers, but showed no symptomatically improvement. CRP elevated at 49 and ESR elevated at 49. Ambien audible upper airway wheeze and diffuse wheezes and lungs noted  throughout admission. Patient started on twice a day PPI for treatment of possible GERD. Limited sinus CT showed left maxillary and ethmoidal mucosal thickening, so patient started on Flonase. Patient requiring intermittent oxygen during admission and ambulatory O2 monitoring prior to discharge showed saturation of 96% on room air so patient was discharged without supplemental oxygen.   patient referred to pulmonology and sleep medicine prior to discharge for outpatient evaluation, likely including PFTs.  Cough-related syncope. Having episodes BID x2wk prior to admission. No events on telemetry. Improving with improvement in cough.  Iron Deficiency Anemia. Hgb 8.5 on admission. Baseline around 9. MCV 74. Low iron, ferritin. High/normal TIBC. Fe supplementation started during admission.  HTN: Controlled throughout admission without home amlodipine.  Amlodipine held on discharge.    Hypothyroidism: taking synthroid intermittently. TSH wnl. Care management consulted for medication assistance.   All other chronic medical conditions stable throughout admission and managed with home regimens.   Issues for Follow Up:  - f/u respiratory status - f/u BP - holding home amlodipine - consider stopping PPI after 2 wks if no improvement - patient referred to pulm/sleep medicine for further eval - f/u financial assistance and compliance with medications - f/u Hgb in 1-2 months  Significant Procedures: none  Significant Labs and Imaging:   Recent Labs Lab 09/22/14 0751 09/23/14 0520 09/24/14 0352  WBC 10.2 14.1* 13.5*  HGB 8.5* 7.8* 7.4*  HCT 29.1* 26.9* 25.4*  PLT 526* 520* 434*    Recent Labs Lab 09/22/14 0751 09/23/14 0520 09/24/14 0352  NA 135 138 137  K 3.8 4.6 4.6  CL 102 108 105  CO2 '23 26 25  ' GLUCOSE 207* 218* 139*  BUN 14 18 23*  CREATININE  1.57* 1.75* 1.54*  CALCIUM 8.5* 8.7* 8.8*  MG  --   --  2.0  PHOS  --   --  3.1    Recent Labs Lab 09/22/14 1140 09/22/14 1646   TROPONINI <0.03 <0.03     TSH: 3.39     CXR: IMPRESSION: No edema or consolidation.   D- dimer: 0.27  Trop: 0.0  BNP: 10.2  Results/Tests Pending at Time of Discharge: none  Discharge Medications:    Medication List    STOP taking these medications        amLODipine 10 MG tablet  Commonly known as:  NORVASC      TAKE these medications        accu-chek multiclix lancets  Use as instructed     albuterol 108 (90 BASE) MCG/ACT inhaler  Commonly known as:  PROVENTIL HFA;VENTOLIN HFA  Inhale 1-2 puffs into the lungs every 6 (six) hours as needed for wheezing or shortness of breath.     albuterol (2.5 MG/3ML) 0.083% nebulizer solution  Commonly known as:  PROVENTIL  Take 3 mLs (2.5 mg total) by nebulization every 4 (four) hours as needed for wheezing or shortness of breath.     benzonatate 100 MG capsule  Commonly known as:  TESSALON  Take 1 capsule (100 mg total) by mouth 2 (two) times daily.     busPIRone 10 MG tablet  Commonly known as:  BUSPAR  Take 10 mg by mouth 2 (two) times daily.     cetirizine 10 MG tablet  Commonly known as:  ZYRTEC  Take 1 tablet (10 mg total) by mouth daily.     cloNIDine 0.1 MG tablet  Commonly known as:  CATAPRES  Take 0.1 mg by mouth as needed (anxiety).     ferrous sulfate 325 (65 FE) MG tablet  Take 1 tablet (325 mg total) by mouth 2 (two) times daily with a meal.     FLUoxetine 40 MG capsule  Commonly known as:  PROZAC  Take 80 mg by mouth daily.     fluticasone 50 MCG/ACT nasal spray  Commonly known as:  FLONASE  Place 2 sprays into both nostrils daily.     glimepiride 2 MG tablet  Commonly known as:  AMARYL  Take 2 tablets (4 mg total) by mouth daily before breakfast.     glucose blood test strip  Commonly known as:  ONE TOUCH ULTRA TEST  Check sugar every morning and 2 hours after your biggest meal of the day     guaiFENesin-dextromethorphan 100-10 MG/5ML syrup  Commonly known as:  ROBITUSSIN DM  Take 5  mLs by mouth every 4 (four) hours as needed for cough.     levothyroxine 300 MCG tablet  Commonly known as:  SYNTHROID, LEVOTHROID  Take 1 tablet (300 mcg total) by mouth daily.     LORazepam 1 MG tablet  Commonly known as:  ATIVAN  Take 1 tablet (1 mg total) by mouth 3 (three) times daily as needed for anxiety. Do not fill before 30 days after last refill.     LORazepam 1 MG tablet  Commonly known as:  ATIVAN  Take 1 tablet (1 mg total) by mouth 3 (three) times daily as needed for anxiety. Do not fill before 30 days after last refill.     LORazepam 1 MG tablet  Commonly known as:  ATIVAN  Take 1 tablet (1 mg total) by mouth 3 (three) times daily as needed for anxiety. Do not fill  before 30 days after last refill.     ONE TOUCH ULTRA SYSTEM KIT W/DEVICE Kit  Check CBG's once daily and prn.  Please substitute glucometer and lancets per formulary.     pantoprazole 40 MG tablet  Commonly known as:  PROTONIX  Take 1 tablet (40 mg total) by mouth 2 (two) times daily.     predniSONE 50 MG tablet  Commonly known as:  DELTASONE  Take 1 tablet (50 mg total) by mouth daily with breakfast.  Start taking on:  09/25/2014     traZODone 100 MG tablet  Commonly known as:  DESYREL  Take 150 mg by mouth at bedtime.        Discharge Instructions: Please refer to Patient Instructions section of EMR for full details.  Patient was counseled important signs and symptoms that should prompt return to medical care, changes in medications, dietary instructions, activity restrictions, and follow up appointments.   Follow-Up Appointments: Follow-up Information    Follow up with Lavon Paganini, MD On 09/28/2014.   Specialty:  Family Medicine   Why:  For hospital follow-up, appt at 3:45   Contact information:   [place] [address] [phone]       Virginia Crews, MD 09/24/2014, 12:15 PM PGY-2, [place]

## 2014-09-24 NOTE — Progress Notes (Signed)
09/24/14  Patient was ambulated in hallway without oxygen and is able to maintain 96% without problem. IV site removed,telemetry removed, and discharge instructions reviewed with patient. Waiting on husband to transport home.

## 2014-09-25 LAB — ANTINUCLEAR ANTIBODIES, IFA: ANA Ab, IFA: NEGATIVE

## 2014-09-26 LAB — RHEUMATOID FACTOR: RHEUMATOID FACTOR: 10 [IU]/mL (ref 0.0–13.9)

## 2014-09-28 ENCOUNTER — Ambulatory Visit: Payer: BC Managed Care – PPO | Admitting: Family Medicine

## 2014-10-09 ENCOUNTER — Other Ambulatory Visit: Payer: Self-pay | Admitting: *Deleted

## 2014-10-09 DIAGNOSIS — E039 Hypothyroidism, unspecified: Secondary | ICD-10-CM

## 2014-10-09 DIAGNOSIS — E038 Other specified hypothyroidism: Secondary | ICD-10-CM

## 2014-10-09 DIAGNOSIS — I10 Essential (primary) hypertension: Secondary | ICD-10-CM

## 2014-10-09 DIAGNOSIS — R350 Frequency of micturition: Secondary | ICD-10-CM

## 2014-10-09 DIAGNOSIS — F411 Generalized anxiety disorder: Secondary | ICD-10-CM

## 2014-10-09 MED ORDER — LEVOTHYROXINE SODIUM 300 MCG PO TABS: 300.0000 ug | ORAL_TABLET | Freq: Every day | ORAL | Status: DC

## 2014-10-09 MED ORDER — GLIMEPIRIDE 2 MG PO TABS: 4.0000 mg | ORAL_TABLET | Freq: Every day | ORAL | Status: DC

## 2014-10-12 MED ORDER — LEVOTHYROXINE SODIUM 300 MCG PO TABS: 300.0000 ug | ORAL_TABLET | Freq: Every day | ORAL | Status: DC

## 2014-10-12 MED ORDER — GLIMEPIRIDE 2 MG PO TABS: 4.0000 mg | ORAL_TABLET | Freq: Every day | ORAL | Status: DC

## 2014-10-12 NOTE — Telephone Encounter (Signed)
Glimeperide and Levothyroxine refilled again and sent to pharmacy. Original refills printed and unable to be found Pt will be informed    Javeion Cannedy A. Lincoln Brigham MD, [facility] Resident PGY-2 Pager [phone]

## 2014-10-12 NOTE — Addendum Note (Signed)
Addended by: Marina Goodell A on: 10/12/2014 01:52 PM   Modules accepted: Orders

## 2014-10-26 LAB — HM DIABETES EYE EXAM

## 2014-12-13 ENCOUNTER — Encounter: Payer: Self-pay | Admitting: Family Medicine

## 2014-12-22 ENCOUNTER — Ambulatory Visit (INDEPENDENT_AMBULATORY_CARE_PROVIDER_SITE_OTHER): Payer: BC Managed Care – PPO | Admitting: Family Medicine

## 2014-12-22 ENCOUNTER — Encounter: Payer: Self-pay | Admitting: Family Medicine

## 2014-12-22 VITALS — BP 147/95 | HR 82 | Temp 98.6°F | Ht 70.0 in | Wt 377.3 lb

## 2014-12-22 DIAGNOSIS — F4321 Adjustment disorder with depressed mood: Secondary | ICD-10-CM

## 2014-12-22 DIAGNOSIS — F411 Generalized anxiety disorder: Secondary | ICD-10-CM

## 2014-12-22 DIAGNOSIS — Z634 Disappearance and death of family member: Secondary | ICD-10-CM

## 2014-12-22 DIAGNOSIS — M7701 Medial epicondylitis, right elbow: Secondary | ICD-10-CM

## 2014-12-22 DIAGNOSIS — Z23 Encounter for immunization: Secondary | ICD-10-CM

## 2014-12-22 DIAGNOSIS — M7700 Medial epicondylitis, unspecified elbow: Secondary | ICD-10-CM | POA: Insufficient documentation

## 2014-12-22 MED ORDER — LORAZEPAM 1 MG PO TABS: 1.0000 mg | ORAL_TABLET | Freq: Three times a day (TID) | ORAL | Status: DC | PRN

## 2014-12-22 NOTE — Assessment & Plan Note (Signed)
>>  ASSESSMENT AND PLAN FOR ANXIETY WRITTEN ON 12/22/2014  4:58 PM BY Erasmo Downer, MD  Rx for Ativan 1mg  TID prn #90 per month x3 months given Continue counseling F/u in 3 months

## 2014-12-22 NOTE — Assessment & Plan Note (Signed)
Encouraged diet and exercise.  

## 2014-12-22 NOTE — Assessment & Plan Note (Signed)
Rx for Ativan 1mg  TID prn #90 per month x3 months given Continue counseling F/u in 3 months

## 2014-12-22 NOTE — Progress Notes (Signed)
   Subjective:   [name] is a 44 y.o. female with a history of anxiety, depression, OCD, grief at the loss of a child, T2DM, HTN, hypothyroidism here for grief and anxiety  Grief: - went back to work - states she is doing well, but yesterday was hard as it was 6 months since son died - counseling: went to hospice for individual counseling sessions, goes prn - also goes to family services for group counseling weekly - Denies any HI, occasionally has passive SI (not as bad as before) - Reports that she has a good support system with son, book club  Anxiety: - anxiety has been "about the same" - Patient is followed by [facility] behavioral health for medication management. They prescribe her BuSpar, clonidine, Prozac, trazodone.  - Taking Ativan 1 mg 3 times a day when necessary  Obesity - not really exercising - only eating ~1 meal/day but large volume - has previously seen nutrition  R elbow pain - bothering her for months - getting worse over last month - stiff in AM - thinks she may be sleeping on it funny - hasnt noticed any activity that makes it worse - hasn't tried any medicatiosn - is worried about arthritis  Review of Systems:  Per HPI. All other systems reviewed and are negative.   PMH, PSH, Medications, Allergies, and FmHx reviewed and updated in EMR.  Social History: non smoker  Objective:  BP 147/95 mmHg  Pulse 82  Temp(Src) 98.6 F (37 C) (Oral)  Ht 5\' 10"  (1.778 m)  Wt 377 lb 4.8 oz (171.142 kg)  BMI 54.14 kg/m2  Gen:  44 y.o. female in NAD HEENT: NCAT, MMM, EOMI, PERRL, anicteric sclerae CV: RRR, no MRG Resp: Non-labored, CTAB, no wheezes noted Ext: WWP, no edema MSK: R elbow: Full ROM, strength intact, TTP over medial epicondyle  Neuro: Alert and oriented, speech normal     Assessment & Plan:     [name] is a 44 y.o. female here for grief, anxiety, R elbow pain, obesity  Golfer's elbow TTP over medial epicondyle C/w golfer's  elbow Recommend rest, ice and exercises Consider referral to sports medicine if persists  Morbid obesity Encouraged diet and exercise  Anxiety state Rx for Ativan 1mg  TID prn #90 per month x3 months given Continue counseling F/u in 3 months  Grief at loss of child Continue counseling Doing well    Virginia Crews, MD MPH PGY-2,  [facility] 12/22/2014  4:59 PM

## 2014-12-22 NOTE — Patient Instructions (Signed)
Nice to see you again today. Continue to take your current medications.  I'm glad You're doing so well.  I think your elbow pain is what is called golfers elbow. Try to rest it and ice it when possible. Use the exercises below twice daily. Let me know if the pain does not improve and we can talk about sending you to sports medicine.  Come back to see me in 3 months and we should talk about your diabetes and blood pressure at that time as well as anxiety.  Take care, Dr. Jacinto Reap

## 2014-12-22 NOTE — Assessment & Plan Note (Signed)
TTP over medial epicondyle C/w golfer's elbow Recommend rest, ice and exercises Consider referral to sports medicine if persists

## 2014-12-22 NOTE — Assessment & Plan Note (Signed)
Continue counseling Doing well

## 2015-02-04 ENCOUNTER — Emergency Department (HOSPITAL_COMMUNITY)
Admission: EM | Admit: 2015-02-04 | Discharge: 2015-02-04 | Disposition: A | Discharge status: Home / Self Care | Payer: BC Managed Care – PPO | Attending: Emergency Medicine | Admitting: Emergency Medicine

## 2015-02-04 ENCOUNTER — Encounter (HOSPITAL_COMMUNITY): Payer: Self-pay

## 2015-02-04 DIAGNOSIS — Z7951 Long term (current) use of inhaled steroids: Secondary | ICD-10-CM | POA: Diagnosis not present

## 2015-02-04 DIAGNOSIS — J029 Acute pharyngitis, unspecified: Secondary | ICD-10-CM | POA: Diagnosis present

## 2015-02-04 DIAGNOSIS — E119 Type 2 diabetes mellitus without complications: Secondary | ICD-10-CM | POA: Insufficient documentation

## 2015-02-04 DIAGNOSIS — Z91040 Latex allergy status: Secondary | ICD-10-CM | POA: Diagnosis not present

## 2015-02-04 DIAGNOSIS — Z79899 Other long term (current) drug therapy: Secondary | ICD-10-CM | POA: Insufficient documentation

## 2015-02-04 DIAGNOSIS — Z915 Personal history of self-harm: Secondary | ICD-10-CM | POA: Diagnosis not present

## 2015-02-04 LAB — RAPID STREP SCREEN (MED CTR MEBANE ONLY): Streptococcus, Group A Screen (Direct): NEGATIVE

## 2015-02-04 LAB — CBG MONITORING, ED: Glucose-Capillary: 120 mg/dL — ABNORMAL HIGH (ref 65–99)

## 2015-02-04 MED ORDER — PENICILLIN V POTASSIUM 500 MG PO TABS: 500.0000 mg | ORAL_TABLET | Freq: Two times a day (BID) | ORAL | Status: AC

## 2015-02-04 MED ORDER — ACETAMINOPHEN 160 MG/5ML PO SOLN
650.0000 mg | Freq: Once | ORAL | Status: AC
  Administered 2015-02-04: 650 mg via ORAL
  Filled 2015-02-04: qty 20.3

## 2015-02-04 MED ORDER — PENICILLIN V POTASSIUM 250 MG PO TABS
500.0000 mg | ORAL_TABLET | Freq: Once | ORAL | Status: AC
  Administered 2015-02-04: 500 mg via ORAL
  Filled 2015-02-04: qty 2

## 2015-02-04 NOTE — ED Provider Notes (Signed)
CSN: [id number]     Arrival date & time 02/04/15  2053 History   First MD Initiated Contact with Patient 02/04/15 2057     Chief Complaint  Patient presents with  . Sore Throat   HPI   44 year old female presents today with sore throat, rhinorrhea, fatigue. Patient reports that for the last 7 days she's had soreness to her throat, painful swallowing, fatigue, and intermittent fever. Patient reports that her children have been sick recently with strep throat, she fears that she may have this too. She reports painful swallowing, denies inability to tolerate by mouth intake. She reports rhinorrhea, and left ear congestion and pain. Patient also endorses tender cervical lymphadenopathy worse on the left. Patient denies nausea, vomiting, swelling of the mouth, tongue, difficulty breathing, drooling, significant change in pitch of voice, anterior neck or floor mouth pain or swelling, chest pain or cough, nausea or vomiting, abdominal pain diarrhea, rash. Patient reports attempting using salt water gargles without significant improvement in symptoms. She reports she's been able to eat and drink small amounts, but finds solid foods too painful.  Past Medical History  Diagnosis Date  . Suicide attempt by drug ingestion ([place]) 06/05/10    result rhabdomyolosis and ARF requrining dialysis   . Rhabdomyolysis 06/06/10    after drug overdose  . Acute renal failure ([place]) 05/27/10    hemodialysis for 6 weeks  . Anxiety   . Diabetes mellitus   . Depression   . Hypertension    Past Surgical History  Procedure Laterality Date  . Tubal ligation  1998  . Cholecystectomy     History reviewed. No pertinent family history. Social History  Substance Use Topics  . Smoking status: Never Smoker   . Smokeless tobacco: None  . Alcohol Use: No   OB History    No data available     Review of Systems  All other systems reviewed and are negative.   Allergies  Ace inhibitors; Haldol; Latex; Metformin and  related; and Nsaids  Home Medications   Prior to Admission medications   Medication Sig Start Date End Date Taking? Authorizing Provider  albuterol (PROVENTIL HFA;VENTOLIN HFA) 108 (90 BASE) MCG/ACT inhaler Inhale 1-2 puffs into the lungs every 6 (six) hours as needed for wheezing or shortness of breath.    Historical Provider, MD  albuterol (PROVENTIL) (2.5 MG/3ML) 0.083% nebulizer solution Take 3 mLs (2.5 mg total) by nebulization every 4 (four) hours as needed for wheezing or shortness of breath. 06/22/14   Virginia Crews, MD  benzonatate (TESSALON) 100 MG capsule Take 1 capsule (100 mg total) by mouth 2 (two) times daily. 09/24/14   Virginia Crews, MD  Blood Glucose Monitoring Suppl (ONE TOUCH ULTRA SYSTEM KIT) W/DEVICE KIT Check CBG's once daily and prn.  Please substitute glucometer and lancets per formulary. 04/27/12   Katherina Mires, MD  busPIRone (BUSPAR) 10 MG tablet Take 10 mg by mouth 2 (two) times daily.    Historical Provider, MD  cetirizine (ZYRTEC) 10 MG tablet Take 1 tablet (10 mg total) by mouth daily. 06/22/14   Virginia Crews, MD  cloNIDine (CATAPRES) 0.1 MG tablet Take 0.1 mg by mouth as needed (anxiety).    Historical Provider, MD  ferrous sulfate 325 (65 FE) MG tablet Take 1 tablet (325 mg total) by mouth 2 (two) times daily with a meal. 09/24/14   Virginia Crews, MD  FLUoxetine (PROZAC) 40 MG capsule Take 80 mg by mouth daily.  Historical Provider, MD  fluticasone (FLONASE) 50 MCG/ACT nasal spray Place 2 sprays into both nostrils daily. 09/24/14   Virginia Crews, MD  glimepiride (AMARYL) 2 MG tablet Take 2 tablets (4 mg total) by mouth daily before breakfast. 10/12/14   Veatrice Bourbon, MD  glucose blood (ONE TOUCH ULTRA TEST) test strip Check sugar every morning and 2 hours after your biggest meal of the day 01/14/12   Melony Overly, MD  guaiFENesin-dextromethorphan (ROBITUSSIN DM) 100-10 MG/5ML syrup Take 5 mLs by mouth every 4 (four) hours as needed for  cough. 09/24/14   Virginia Crews, MD  Lancets (ACCU-CHEK MULTICLIX) lancets Use as instructed 01/14/12   Melony Overly, MD  levothyroxine (SYNTHROID, LEVOTHROID) 300 MCG tablet Take 1 tablet (300 mcg total) by mouth daily. 10/12/14   Alyssa A Haney, MD  LORazepam (ATIVAN) 1 MG tablet Take 1 tablet (1 mg total) by mouth 3 (three) times daily as needed for anxiety. Do not fill before 30 days after last refill. 12/22/14   Virginia Crews, MD  LORazepam (ATIVAN) 1 MG tablet Take 1 tablet (1 mg total) by mouth 3 (three) times daily as needed for anxiety. Do not fill before 30 days after last refill. 12/22/14   Virginia Crews, MD  LORazepam (ATIVAN) 1 MG tablet Take 1 tablet (1 mg total) by mouth 3 (three) times daily as needed for anxiety. Do not fill before 30 days after last refill. 12/22/14   Virginia Crews, MD  pantoprazole (PROTONIX) 40 MG tablet Take 1 tablet (40 mg total) by mouth 2 (two) times daily. 09/24/14   Virginia Crews, MD  penicillin v potassium (VEETID) 500 MG tablet Take 1 tablet (500 mg total) by mouth 2 (two) times daily. 02/04/15 02/11/15  Okey Regal, PA-C  traZODone (DESYREL) 100 MG tablet Take 150 mg by mouth at bedtime.  02/26/13   Historical Provider, MD   BP 112/69 mmHg  Pulse 96  Temp(Src) 99.5 F (37.5 C) (Oral)  Resp 24  SpO2 96%   Physical Exam  Constitutional: She is oriented to person, place, and time. She appears well-developed and well-nourished.  Morbidly obese female well appearing in no acute distress, nontoxic  HENT:  Head: Normocephalic and atraumatic.  Right Ear: External ear normal.  Left Ear: External ear normal.  Nose: Rhinorrhea present.  Oral exam shows bilateral enlarged tonsils, no exudate, symmetrical bilateral, uvula midline and rises with phonation, no postpharyngeal edema or swelling, floor the mouth soft, nontender. No obvious swelling of the neck, tender enlarged cervical lymphadenopathy, more tender on the left. External  palpation of the jaw external ear and mastoid nontender. Patient has full active range of motion of the neck.  Eyes: Conjunctivae are normal. Pupils are equal, round, and reactive to light. Right eye exhibits no discharge. Left eye exhibits no discharge. No scleral icterus.  Neck: Normal range of motion. No JVD present. No tracheal deviation present. No thyromegaly present.  Pulmonary/Chest: Effort normal and breath sounds normal. No stridor. No respiratory distress. She has no wheezes. She has no rales. She exhibits no tenderness.  Musculoskeletal: Normal range of motion. She exhibits no edema or tenderness.  Lymphadenopathy:    She has cervical adenopathy.  Neurological: She is alert and oriented to person, place, and time. Coordination normal.  Skin: Skin is warm and dry. No rash noted. No erythema. No pallor.  Psychiatric: She has a normal mood and affect. Her behavior is normal. Judgment and thought content normal.  Nursing note and vitals reviewed.   ED Course  Procedures (including critical care time) Labs Review Labs Reviewed  CBG MONITORING, ED - Abnormal; Notable for the following:    Glucose-Capillary 120 (*)    All other components within normal limits  RAPID STREP SCREEN (NOT AT [facility])  CULTURE, GROUP A STREP    Imaging Review No results found. I have personally reviewed and evaluated these images and lab results as part of my medical decision-making.   EKG Interpretation None      MDM   Final diagnoses:  Pharyngitis    Labs: Rapid strep negative, 20 care CBG 120  Imaging:  Consults:  Therapeutics: Penicillin V, Tylenol  Discharge Meds: Penicillin  Assessment/Plan:   Pt presents with likely  Pharyngitis. High suspicion for strep pharyngitis as patient's children have been diagnosed with strep, she has a Centor criteria of 3 based on tender cervical lymphadenopathy, swollen lymph nodes, lack of cough and fever greater than 100.4. Although rapid strep is  negative patient will be started on antibiotic therapy with follow-up with her primary care in 2-3 days. No difficulty swallowing although painful, drooling, dysphonia, muffled voice, stridor, swelling of the neck, trismus, mouth pain, swelling/ pain in submandibular area or floor of mouth ,assymetry of tonsils, or ulcerations; unlikely epiglottitis, PTA, submandibular space infection, retropharyngeal space infection, or HIV. Pt treated here in the ED with therapeutics listed above, given strict return precautions, PCP follow-up for re-evaluation in 3 days, return to the ED if they worsen. Pt verbalized understanding and agreement to today's plan and had no further questions or concerns at the time of discharge.  Patient tolerated by mouth here in the ED.        Okey Regal, PA-C 02/04/15 2919  Merrily Pew, MD 02/06/15 1248

## 2015-02-04 NOTE — ED Notes (Signed)
Pt states "Im cold and weak. I feel terrible." Complaining of sore throat since Saturday.

## 2015-02-04 NOTE — Discharge Instructions (Signed)
Pharyngitis Pharyngitis is redness, pain, and swelling (inflammation) of your pharynx.  CAUSES  Pharyngitis is usually caused by infection. Most of the time, these infections are from viruses (viral) and are part of a cold. However, sometimes pharyngitis is caused by bacteria (bacterial). Pharyngitis can also be caused by allergies. Viral pharyngitis may be spread from person to person by coughing, sneezing, and personal items or utensils (cups, forks, spoons, toothbrushes). Bacterial pharyngitis may be spread from person to person by more intimate contact, such as kissing.  SIGNS AND SYMPTOMS  Symptoms of pharyngitis include:   Sore throat.   Tiredness (fatigue).   Low-grade fever.   Headache.  Joint pain and muscle aches.  Skin rashes.  Swollen lymph nodes.  Plaque-like film on throat or tonsils (often seen with bacterial pharyngitis). DIAGNOSIS  Your health care provider will ask you questions about your illness and your symptoms. Your medical history, along with a physical exam, is often all that is needed to diagnose pharyngitis. Sometimes, a rapid strep test is done. Other lab tests may also be done, depending on the suspected cause.  TREATMENT  Viral pharyngitis will usually get better in 3-4 days without the use of medicine. Bacterial pharyngitis is treated with medicines that kill germs (antibiotics).  HOME CARE INSTRUCTIONS   Drink enough water and fluids to keep your urine clear or pale yellow.   Only take over-the-counter or prescription medicines as directed by your health care provider:   If you are prescribed antibiotics, make sure you finish them even if you start to feel better.   Do not take aspirin.   Get lots of rest.   Gargle with 8 oz of salt water ( tsp of salt per 1 qt of water) as often as every 1-2 hours to soothe your throat.   Throat lozenges (if you are not at risk for choking) or sprays may be used to soothe your throat. SEEK MEDICAL  CARE IF:   You have large, tender lumps in your neck.  You have a rash.  You cough up green, yellow-brown, or bloody spit. SEEK IMMEDIATE MEDICAL CARE IF:   Your neck becomes stiff.  You drool or are unable to swallow liquids.  You vomit or are unable to keep medicines or liquids down.  You have severe pain that does not go away with the use of recommended medicines.  You have trouble breathing (not caused by a stuffy nose). MAKE SURE YOU:   Understand these instructions.  Will watch your condition.  Will get help right away if you are not doing well or get worse.   This information is not intended to replace advice given to you by your health care provider. Make sure you discuss any questions you have with your health care provider.   Document Released: 02/24/2005 Document Revised: 12/15/2012 Document Reviewed: 11/01/2012 Elsevier Interactive Patient Education 2016 [employer].  Please read all attached information, please use antibiotics as directed. Please follow-up with your primary care provider in 3 days for reevaluation. If any new or worsening signs or symptoms present please return immediately to the emergency room for further evaluation and management.

## 2015-02-06 ENCOUNTER — Telehealth: Payer: Self-pay | Admitting: Family Medicine

## 2015-02-07 ENCOUNTER — Encounter: Payer: Self-pay | Admitting: Family Medicine

## 2015-02-07 ENCOUNTER — Ambulatory Visit (INDEPENDENT_AMBULATORY_CARE_PROVIDER_SITE_OTHER): Payer: BC Managed Care – PPO | Admitting: Family Medicine

## 2015-02-07 VITALS — BP 183/97 | HR 113 | Temp 99.7°F | Ht 70.0 in | Wt 365.8 lb

## 2015-02-07 DIAGNOSIS — J029 Acute pharyngitis, unspecified: Secondary | ICD-10-CM

## 2015-02-07 LAB — CULTURE, GROUP A STREP

## 2015-02-07 MED ORDER — AMOXICILLIN-POT CLAVULANATE 875-125 MG PO TABS: 1.0000 | ORAL_TABLET | Freq: Two times a day (BID) | ORAL | Status: DC

## 2015-02-07 NOTE — Patient Instructions (Signed)
Thank you for coming in,   I have sent in Augmentin.  Please stop the penicillin that you're currently taking.  I would go to work once you don't have any more pain in her throat.  Please bring all of your medications with you to each visit.   Sign up for My Chart to have easy access to your labs results, and communication with your Primary care physician   Please feel free to call with any questions or concerns at any time, at [phone]. --Dr. Raeford Razor

## 2015-02-07 NOTE — Progress Notes (Signed)
   Subjective:    Patient ID: [name], female    DOB: [birth year], 44 y.o.   MRN: [id number]  Seen for Same day visit for   CC: sore throat   She was evaluated in the emergency department on November 27. Rapid strep was negative. She has children at home that have been diagnosed with strep Culture at that time showed beta-hemolytic strep and no group A. She was given penicillin V in the ED. Sore throat began 10 days ago. STD exposure: no  Symptoms Fever: no Cough: no Runny nose: no Muscle aches: no Swollen Glands: no Trouble breathing: no Drooling: no Weight loss: no   Review of Systems   See HPI for ROS. Objective:  BP 183/97 mmHg  Pulse 113  Temp(Src) 99.7 F (37.6 C) (Oral)  Ht 5\' 10"  (1.778 m)  Wt 365 lb 12.8 oz (165.926 kg)  BMI 52.49 kg/m2  General: NAD, tearful during exam  HEENT: tonsillar exudates bilaterally, no cervical lymphadenopathy, clear conjunctiva, tympanic membrane is clear and intact bilaterally, uvula midline Cardiac: RRR, normal heart sounds, no murmurs. 2+ radial and PT pulses bilaterally Respiratory: CTAB, normal effort, no crackles or wheezes     Assessment & Plan:   Sore throat She has been taking penicillin VK since being seen in the emergency department on the 27th She's had no improvement of her symptoms Culture at that time grew beta hemolytic strep - Write for Augmentin for 10 days - Given indications the follow-up or return - Advised that she stay out of work until the pain has improved. Most likely you back on Friday or Monday

## 2015-02-07 NOTE — Telephone Encounter (Signed)
Patient called complaining of throat pain, fevers and body aches. Seen in ED Sunday and treated for strep throat despite negative test. States she is not getting any better. Is unable to swallow due to pain. No trouble breathing or opening her mouth. Quite tearful on the phone. I advised the patient to come in for re-evaluation given her worsening status. Patient states it is too expensive and she just wanted someone to know how bad she felt. Advised that if she can't swallow water at least she may need IV fluids and to watch for signs of dehydration. Patient agreed.

## 2015-02-08 DIAGNOSIS — J029 Acute pharyngitis, unspecified: Secondary | ICD-10-CM | POA: Insufficient documentation

## 2015-02-08 NOTE — Assessment & Plan Note (Addendum)
She has been taking penicillin VK since being seen in the emergency department on the 27th She's had no improvement of her symptoms Culture at that time grew beta hemolytic strep - Write for Augmentin for 10 days - Given indications the follow-up or return - Advised that she stay out of work until the pain has improved. Most likely you back on Friday or Monday

## 2015-02-13 ENCOUNTER — Telehealth: Payer: Self-pay | Admitting: Family Medicine

## 2015-02-13 NOTE — Telephone Encounter (Signed)
Has sores in her mouth. She has been unable to eat for over a week. She can only drink water. She doesn't have the money for a copay to come back. Could something be called in for this?

## 2015-02-13 NOTE — Telephone Encounter (Signed)
Left VM for patient. If she calls back please have her speak with a nurse/CMA and inform that if she is having pain still then she needs to be seen. She should have resolution of her sore throat since taking the augmentin. Could check a mono spot if not resolving with antibiotics then therapy would change. .   If any questions then please take the best time and phone number to call and I will try to call her back.   Rosemarie Ax, MD PGY-3, [facility] 02/13/2015, 3:07 PM

## 2015-02-14 NOTE — Telephone Encounter (Signed)
Gave patient message from MD. Patient states that the antibiotic given hasnt much helped. Has sores in mouth and hurts to eat. Patient states that she doesn't have money to come back in for another appointment and that she didn't care for Dr. Raeford Razor. Patient request for me to send message to PCP to see if there is anything she could recommend over the counter to soothe her mouth pain since she is unable to afford another appointment. Will forward message to PCP.

## 2015-02-15 NOTE — Telephone Encounter (Signed)
I agree with Dr. Raeford Razor.  It is concerning if antibiotics are not helping and this has been ongoing for over 2 weeks.  She can use some cough drops or throat spray, to soothe it, but she should be seen if at all possible to be re-evaluated and make sure nothing serious is happening.  I am off site today, so it will be difficult for me to contact the patient. Red team, would you mind relaying this to patient.  Virginia Crews, MD, MPH PGY-2,  [facility] 02/15/2015 8:20 AM

## 2015-02-16 NOTE — Telephone Encounter (Signed)
Called patient, line busy x 2. Will try again later.

## 2015-04-10 ENCOUNTER — Encounter: Payer: Self-pay | Admitting: Family Medicine

## 2015-04-10 ENCOUNTER — Ambulatory Visit (INDEPENDENT_AMBULATORY_CARE_PROVIDER_SITE_OTHER): Payer: BC Managed Care – PPO | Admitting: Family Medicine

## 2015-04-10 VITALS — BP 156/76 | HR 80 | Temp 98.4°F | Ht 70.0 in | Wt 375.1 lb

## 2015-04-10 DIAGNOSIS — R5383 Other fatigue: Secondary | ICD-10-CM

## 2015-04-10 DIAGNOSIS — E039 Hypothyroidism, unspecified: Secondary | ICD-10-CM

## 2015-04-10 DIAGNOSIS — F4323 Adjustment disorder with mixed anxiety and depressed mood: Secondary | ICD-10-CM

## 2015-04-10 DIAGNOSIS — Z9114 Patient's other noncompliance with medication regimen: Secondary | ICD-10-CM

## 2015-04-10 DIAGNOSIS — D649 Anemia, unspecified: Secondary | ICD-10-CM

## 2015-04-10 DIAGNOSIS — Z91148 Patient's other noncompliance with medication regimen for other reason: Secondary | ICD-10-CM | POA: Insufficient documentation

## 2015-04-10 DIAGNOSIS — J4521 Mild intermittent asthma with (acute) exacerbation: Secondary | ICD-10-CM

## 2015-04-10 DIAGNOSIS — E118 Type 2 diabetes mellitus with unspecified complications: Secondary | ICD-10-CM

## 2015-04-10 DIAGNOSIS — E038 Other specified hypothyroidism: Secondary | ICD-10-CM

## 2015-04-10 DIAGNOSIS — E119 Type 2 diabetes mellitus without complications: Secondary | ICD-10-CM

## 2015-04-10 DIAGNOSIS — D509 Iron deficiency anemia, unspecified: Secondary | ICD-10-CM

## 2015-04-10 DIAGNOSIS — I10 Essential (primary) hypertension: Secondary | ICD-10-CM

## 2015-04-10 DIAGNOSIS — F411 Generalized anxiety disorder: Secondary | ICD-10-CM

## 2015-04-10 LAB — COMPLETE METABOLIC PANEL WITH GFR
ALT: 7 U/L (ref 6–29)
AST: 11 U/L (ref 10–35)
Albumin: 3.4 g/dL — ABNORMAL LOW (ref 3.6–5.1)
Alkaline Phosphatase: 36 U/L (ref 33–115)
BUN: 21 mg/dL (ref 7–25)
CALCIUM: 8.9 mg/dL (ref 8.6–10.2)
CHLORIDE: 101 mmol/L (ref 98–110)
CO2: 24 mmol/L (ref 20–31)
CREATININE: 1.32 mg/dL — AB (ref 0.50–1.10)
GFR, Est African American: 56 mL/min — ABNORMAL LOW (ref >=60)
GFR, Est Non African American: 49 mL/min — ABNORMAL LOW (ref >=60)
Glucose, Bld: 166 mg/dL — ABNORMAL HIGH (ref 65–99)
Potassium: 4.6 mmol/L (ref 3.5–5.3)
Sodium: 133 mmol/L — ABNORMAL LOW (ref 135–146)
Total Bilirubin: 0.2 mg/dL (ref 0.2–1.2)
Total Protein: 7.3 g/dL (ref 6.1–8.1)

## 2015-04-10 LAB — CBC
HCT: 27.6 % — ABNORMAL LOW (ref 36.0–46.0)
Hemoglobin: 8.2 g/dL — ABNORMAL LOW (ref 12.0–15.0)
MCH: 21.9 pg — AB (ref 26.0–34.0)
MCHC: 29.7 g/dL — ABNORMAL LOW (ref 30.0–36.0)
MCV: 73.6 fL — ABNORMAL LOW (ref 78.0–100.0)
MPV: 7.8 fL — ABNORMAL LOW (ref 8.6–12.4)
Platelets: 554 10*3/uL — ABNORMAL HIGH (ref 150–400)
RBC: 3.75 MIL/uL — AB (ref 3.87–5.11)
RDW: 18.1 % — ABNORMAL HIGH (ref 11.5–15.5)
WBC: 10.7 10*3/uL — AB (ref 4.0–10.5)

## 2015-04-10 LAB — LIPID PANEL
CHOLESTEROL: 148 mg/dL (ref 125–200)
HDL: 47 mg/dL (ref >=46)
LDL CALC: 55 mg/dL (ref <130)
Total CHOL/HDL Ratio: 3.1 Ratio (ref <=5.0)
Triglycerides: 229 mg/dL — ABNORMAL HIGH (ref <150)
VLDL: 46 mg/dL — AB (ref <30)

## 2015-04-10 LAB — POCT HEMOGLOBIN: HEMOGLOBIN: 9.2 g/dL — AB (ref 12.2–16.2)

## 2015-04-10 LAB — POCT GLYCOSYLATED HEMOGLOBIN (HGB A1C): Hemoglobin A1C: 7

## 2015-04-10 MED ORDER — ALBUTEROL SULFATE HFA 108 (90 BASE) MCG/ACT IN AERS: 1.0000 | INHALATION_SPRAY | Freq: Four times a day (QID) | RESPIRATORY_TRACT | Status: DC | PRN

## 2015-04-10 MED ORDER — ALBUTEROL SULFATE (2.5 MG/3ML) 0.083% IN NEBU: 2.5000 mg | INHALATION_SOLUTION | RESPIRATORY_TRACT | Status: DC | PRN

## 2015-04-10 MED ORDER — ONETOUCH ULTRA SYSTEM W/DEVICE KIT: PACK | Status: DC

## 2015-04-10 MED ORDER — LORAZEPAM 1 MG PO TABS: 1.0000 mg | ORAL_TABLET | Freq: Three times a day (TID) | ORAL | Status: DC | PRN

## 2015-04-10 MED ORDER — GLIMEPIRIDE 2 MG PO TABS: 4.0000 mg | ORAL_TABLET | Freq: Every day | ORAL | Status: DC

## 2015-04-10 MED ORDER — GLUCOSE BLOOD VI STRP: ORAL_STRIP | Status: DC

## 2015-04-10 MED ORDER — FERROUS SULFATE 325 (65 FE) MG PO TABS: 325.0000 mg | ORAL_TABLET | Freq: Three times a day (TID) | ORAL | Status: DC

## 2015-04-10 MED ORDER — ACCU-CHEK MULTICLIX LANCETS MISC: Status: DC

## 2015-04-10 MED ORDER — AMLODIPINE BESYLATE 10 MG PO TABS: 10.0000 mg | ORAL_TABLET | Freq: Every day | ORAL | Status: DC

## 2015-04-10 MED ORDER — LEVOTHYROXINE SODIUM 300 MCG PO TABS: 300.0000 ug | ORAL_TABLET | Freq: Every day | ORAL | Status: DC

## 2015-04-10 NOTE — Assessment & Plan Note (Signed)
>>  ASSESSMENT AND PLAN FOR ANXIETY WRITTEN ON 04/10/2015  5:07 PM BY Erasmo Downer, MD  Continues to be anxious and tearful during visits Mental state interferes with other medical care and caring for chronic conditions Likely contributes to medication noncompliance in addition to financial constraints Refill of Ativan given Follow-up in 3 months

## 2015-04-10 NOTE — Assessment & Plan Note (Signed)
No acute exacerbation Refilled albuterol

## 2015-04-10 NOTE — Assessment & Plan Note (Signed)
>>  ASSESSMENT AND PLAN FOR ADJUSTMENT DISORDER WITH MIXED ANXIETY AND DEPRESSED MOOD WRITTEN ON 04/10/2015  5:08 PM BY Erasmo Downer, MD  See above plan for anxiety state

## 2015-04-10 NOTE — Assessment & Plan Note (Signed)
Poorly controlled currently Patient unable to take many blood pressure medications due to previous overdose and kidney disease Difficult to manage due to medical noncompliance and only taking medications about 3 times weekly Check CMET today Follow-up in one month and consider adding additional blood pressure agent

## 2015-04-10 NOTE — Assessment & Plan Note (Signed)
Hemoglobin 9.2 today, up from 7.4 previously Could be contributing to fatigue  patient not taking any iron supplementation Advised patient to take ferrous sulfate 3 times a day with meals Advised patient to take Colace daily for softening stool while taking iron supplement CBC today

## 2015-04-10 NOTE — Assessment & Plan Note (Signed)
See above plan for anxiety state

## 2015-04-10 NOTE — Assessment & Plan Note (Addendum)
A1c today Continue glimepiride 4 mg daily Difficult to manage as patient has medical noncompliance and is only take medications 3-4 times weekly Foot exam at next visit Referral to ophthalmology for diabetic retinal exam Encourage diet and exercise Prescription for blood glucose meter given

## 2015-04-10 NOTE — Assessment & Plan Note (Signed)
Continue current dose of Synthroid for now TSH today Difficult to manage as patient is only taking medications 3-4 times weekly Could be contributing to fatigue

## 2015-04-10 NOTE — Assessment & Plan Note (Signed)
Encourage diet and exercise. 

## 2015-04-10 NOTE — Assessment & Plan Note (Signed)
Interferes with medical care and careful chronic medical conditions Patient only taking medications 3-4 times weekly

## 2015-04-10 NOTE — Assessment & Plan Note (Signed)
Continues to be anxious and tearful during visits Mental state interferes with other medical care and caring for chronic conditions Likely contributes to medication noncompliance in addition to financial constraints Refill of Ativan given Follow-up in 3 months

## 2015-04-10 NOTE — Patient Instructions (Signed)
Nice to see you again today. We are getting some labs and someone will call you or send you a letter with the results when they're available. I refilled all your medications. Please try to take these every single day as prescribed. I like to see you back in one month to discuss how these things are going.  I also ordered a sleep study to see if sleep apnea may be part of the problem why her so tired. Please check with your insurance to see what the cost of this will be.  Take care, Dr. Jacinto Reap

## 2015-04-10 NOTE — Progress Notes (Signed)
Subjective:   [name] is a 45 y.o. female with a history of endometrial hyperplasia, iron deficiency anemia, anxiety, major depressive disorder, morbid obesity here for anemia, and fatigue  Fatigue - feels like she needs to sleep all day - naps daily at lunch - thought it must be anemia and that she might need blood transfusion -getting worse since December - just got over a bed cold - has to take rests while getting ready - feels like no one care - doesn't take iron supplement because of constipation - can hear heart beating loudly sometimes - hasnt had menstrual period since sept, but when it comes, has to use 3 pads at a time - reports that husband tells her she gasps for air and snores badly at night  Hypothyroidism - synthroid 378mcg - thinks she takes it 2-3 times weekly  Anxiety - uses ativan regularly TID - is helping symptoms - also taking buspar, clonidine, prozac, trazodone - from [place] - thinks she is really bad off and no one understands - thinks that her heart may just stop one day  HTN: - Medications: amlodipine 10mg  daily - taking ASA 81mg  daily - Compliance: taking 3-4 days/week - Checking BP at home: no - Has occasional SOB with coughing - Denies any CP, vision changes, LE edema, medication SEs, or symptoms of hypotension - Diet: reports eats some days and not others, doesn't eat seconds, not a lot of carbs, diet soda, sweet tea, not too much salty foods - Exercise: cant exercise due to fatigue - reports that she cannot afford medications all the time, so she picks and chooses which medications to get   T2DM - Checking BG at home: not now - Medications: amaryl 4mg  daily - Compliance: takes nightly when eats, 4 times weekly - wants blood glucose monitor for home - eye exam: 08/2014 with OD - foot exam: deferred   Review of Systems:  Per HPI. 10 other systems reviewed and are negative.   PMH, PSH, Medications, Allergies, and FmHx  reviewed and updated in EMR.  Social History: never smoker  Objective:  BP 156/76 mmHg  Pulse 80  Temp(Src) 98.4 F (36.9 C) (Oral)  Ht 5\' 10"  (1.778 m)  Wt 375 lb 1.6 oz (170.144 kg)  BMI 53.82 kg/m2  Gen:  45 y.o. female in NAD, tearful HEENT: NCAT, MMM, EOMI, PERRL, anicteric sclerae, no conjunctival pallor CV: RRR, no MRG, no JVD Resp: Non-labored, CTAB, no wheezes noted Abd: Soft, NTND, BS present, no guarding or organomegaly Ext: WWP, no edema MSK: Gait intact, no obvious deformities, brace on right ankle Neuro: Alert and oriented, speech normal    Assessment & Plan:     [name] is a 45 y.o. female here for anxiety follow-up, diabetes follow-up, hypertension follow-up, hypothyroidism follow-up, anemia follow-up, fatigue  Essential hypertension, benign Poorly controlled currently Patient unable to take many blood pressure medications due to previous overdose and kidney disease Difficult to manage due to medical noncompliance and only taking medications about 3 times weekly Check CMET today Follow-up in one month and consider adding additional blood pressure agent  Asthma with acute exacerbation No acute exacerbation Refilled albuterol  Hypothyroidism Continue current dose of Synthroid for now TSH today Difficult to manage as patient is only taking medications 3-4 times weekly Could be contributing to fatigue  Type II diabetes mellitus with complication (HCC) 123456 today Continue glimepiride 4 mg daily Difficult to manage as patient has medical noncompliance and is only  take medications 3-4 times weekly Foot exam at next visit Referral to ophthalmology for diabetic retinal exam Encourage diet and exercise Prescription for blood glucose meter given  Morbid obesity ([place]) Encourage diet and exercise  Iron deficiency anemia Hemoglobin 9.2 today, up from 7.4 previously Could be contributing to fatigue  patient not taking any iron supplementation Advised  patient to take ferrous sulfate 3 times a day with meals Advised patient to take Colace daily for softening stool while taking iron supplement CBC today  Anxiety state Continues to be anxious and tearful during visits Mental state interferes with other medical care and caring for chronic conditions Likely contributes to medication noncompliance in addition to financial constraints Refill of Ativan given Follow-up in 3 months  Adjustment disorder with mixed anxiety and depressed mood See above plan for anxiety state  Fatigue Likely multifactorial Patient has known iron deficiency anemia is not taking iron supplementation Also check for vitamin D deficiency and electrolyte abnormalities today Patient with hypothyroidism and not regularly taking Synthroid Sleep study ordered is morbidly obese patient with snoring and apnea symptoms likely has obstructive sleep apnea contributing to fatigue as well Follow-up in one month  Non compliance w medication regimen Interferes with medical care and careful chronic medical conditions Patient only taking medications 3-4 times weekly     Virginia Crews, MD MPH PGY-2,  [facility] 04/10/2015  5:11 PM

## 2015-04-10 NOTE — Assessment & Plan Note (Signed)
Likely multifactorial Patient has known iron deficiency anemia is not taking iron supplementation Also check for vitamin D deficiency and electrolyte abnormalities today Patient with hypothyroidism and not regularly taking Synthroid Sleep study ordered is morbidly obese patient with snoring and apnea symptoms likely has obstructive sleep apnea contributing to fatigue as well Follow-up in one month

## 2015-04-11 ENCOUNTER — Telehealth: Payer: Self-pay | Admitting: Family Medicine

## 2015-04-11 ENCOUNTER — Telehealth: Payer: Self-pay | Admitting: *Deleted

## 2015-04-11 DIAGNOSIS — E559 Vitamin D deficiency, unspecified: Secondary | ICD-10-CM

## 2015-04-11 LAB — VITAMIN D 25 HYDROXY (VIT D DEFICIENCY, FRACTURES): VIT D 25 HYDROXY: 8 ng/mL — AB (ref 30–100)

## 2015-04-11 LAB — TSH: TSH: 7.1 u[IU]/mL — AB (ref 0.350–4.500)

## 2015-04-11 MED ORDER — ALBUTEROL SULFATE HFA 108 (90 BASE) MCG/ACT IN AERS: 1.0000 | INHALATION_SPRAY | Freq: Four times a day (QID) | RESPIRATORY_TRACT | Status: DC | PRN

## 2015-04-11 MED ORDER — VITAMIN D (ERGOCALCIFEROL) 1.25 MG (50000 UNIT) PO CAPS: 50000.0000 [IU] | ORAL_CAPSULE | ORAL | Status: DC

## 2015-04-11 NOTE — Telephone Encounter (Addendum)
Attempted to call patient at home and on cell phone.  Left message on VM and with family member asking patient to call back to clinic.  If patient returns call, please relay the following:  Vitamin D is low.  This could contribute to fatigue.  Will start 8 weeks of once weekly vitamin D supplement and recheck after this is completed.    TSH is high at 7.1.  This is due to not taking her Synthroid regularly. I'm unable to adjust the dose until I know what her thyroid function will be when she is taking her medication regularly. If she begins taking her medication regularly, we can recheck only recheck her vitamin D level.  Cholesterol is good. Electrolytes and kidney function are stable. Hemoglobin is low at 8.2. This is similar to the level it was in clinic. Is important to take iron 3 times a day as prescribed. We will recheck this only recheck her vitamin D as well.  A1c good at 7.0.  She should still get her sleep study as this may be another cause for her fatigue.  Please let me know if there are any other questions.  Virginia Crews, MD, MPH PGY-2,  [facility] 04/11/2015 1:59 PM

## 2015-04-11 NOTE — Telephone Encounter (Signed)
New Rx sent  Samantha Crews, MD, MPH PGY-2,  [facility] 04/11/2015 11:33 AM

## 2015-04-11 NOTE — Telephone Encounter (Signed)
Received a fax from [facility] stating that patient's insurance will not cover Ventolin HFA.  Please send in Rx for ProAir.  Derl Barrow, RN

## 2015-04-12 ENCOUNTER — Telehealth: Payer: Self-pay | Admitting: *Deleted

## 2015-04-12 ENCOUNTER — Telehealth: Payer: Self-pay | Admitting: Family Medicine

## 2015-04-12 NOTE — Telephone Encounter (Addendum)
Second faxed request received from [facility] for Rx for Pro-Air as patient's insurance will not cover Proventil or Ventolin. Also, requesting lancets be changed to One Touch Delica lancets ([id number] or [id number]) as patient has a One Touch Meter. Velora Heckler, RN

## 2015-04-12 NOTE — Telephone Encounter (Signed)
Pt called and would like to know what her test results are. jw

## 2015-04-13 MED ORDER — ALBUTEROL SULFATE HFA 108 (90 BASE) MCG/ACT IN AERS: 1.0000 | INHALATION_SPRAY | Freq: Four times a day (QID) | RESPIRATORY_TRACT | Status: DC | PRN

## 2015-04-13 MED ORDER — ONETOUCH DELICA LANCETS 33G MISC: Status: DC

## 2015-04-13 NOTE — Telephone Encounter (Signed)
Proventil/Ventolin HFA inhaler was resent in and not ProAir Inhaler.  Rx for ProAir Inhaler resent to [facility].  No need to complete the prior authorization form.  Derl Barrow, RN

## 2015-04-13 NOTE — Telephone Encounter (Signed)
Please see note from 04/11/15.  Derl Barrow, RN

## 2015-04-13 NOTE — Addendum Note (Signed)
Addended by: Derl Barrow on: 04/13/2015 03:01 PM   Modules accepted: Orders, Medications

## 2015-04-13 NOTE — Telephone Encounter (Signed)
Spoke with patient and informed her of message below. Patient expressed understanding and will follow up in 8 weeks after completion of Vitamin D and taking Synthroid regularly.

## 2015-04-13 NOTE — Telephone Encounter (Signed)
See previous phone note.  

## 2015-04-13 NOTE — Telephone Encounter (Signed)
Rx for Lancets sent to pharmacy.  Willing to fill out prior approval, but unable to find in my box.  Please let me know if there is paperwork available to be filled out.  Virginia Crews, MD, MPH PGY-2,  [facility] 04/13/2015 2:22 PM

## 2015-05-03 ENCOUNTER — Other Ambulatory Visit: Payer: Self-pay | Admitting: Family Medicine

## 2015-05-03 ENCOUNTER — Telehealth: Payer: Self-pay | Admitting: Family Medicine

## 2015-05-03 NOTE — Telephone Encounter (Signed)
Patient should be seen for visit soon.  Someone will need to make sure she doesn't need steroids for an asthma exacerbation.  Virginia Crews, MD, MPH PGY-2,  [facility] 05/03/2015 4:25 PM

## 2015-05-03 NOTE — Telephone Encounter (Signed)
Wheezing has gotten worse.  Is there something she can take over the counter? She is using albuetraol inhaler and her breathing treatments. The coughing is making it worse. Please advise

## 2015-05-07 NOTE — Telephone Encounter (Signed)
Spoke with patient, appointment scheduled for 3/1 with PCP.

## 2015-05-07 NOTE — Telephone Encounter (Signed)
As stated in telephone note on 2/23, patient will need to be seen for shortness of breath to determine whether she might need steroids for asthma exacerbation.  Will not fill prednisone without evaluating patient.  Please let patient know.  Virginia Crews, MD, MPH PGY-2,  [facility] 05/07/2015 8:43 AM

## 2015-05-07 NOTE — Telephone Encounter (Signed)
Want to know if provider could call in prednisone for patient.  Having issues with her allergies and asthma.  Would like for provider to call her on cell phone today.  [phone]

## 2015-05-09 ENCOUNTER — Ambulatory Visit (INDEPENDENT_AMBULATORY_CARE_PROVIDER_SITE_OTHER): Payer: BC Managed Care – PPO | Admitting: Family Medicine

## 2015-05-09 ENCOUNTER — Inpatient Hospital Stay (HOSPITAL_COMMUNITY): Payer: BC Managed Care – PPO

## 2015-05-09 ENCOUNTER — Inpatient Hospital Stay (HOSPITAL_COMMUNITY)
Admission: AD | Admit: 2015-05-09 | Discharge: 2015-05-12 | DRG: 202 | Disposition: A | Discharge status: Home / Self Care | Payer: BC Managed Care – PPO | Source: Ambulatory Visit | Attending: Family Medicine | Admitting: Family Medicine

## 2015-05-09 ENCOUNTER — Other Ambulatory Visit: Payer: Self-pay

## 2015-05-09 ENCOUNTER — Encounter: Payer: Self-pay | Admitting: Family Medicine

## 2015-05-09 VITALS — BP 163/90 | HR 96 | Temp 98.6°F | Ht 70.0 in | Wt 367.0 lb

## 2015-05-09 DIAGNOSIS — R0600 Dyspnea, unspecified: Secondary | ICD-10-CM

## 2015-05-09 DIAGNOSIS — N189 Chronic kidney disease, unspecified: Secondary | ICD-10-CM | POA: Insufficient documentation

## 2015-05-09 DIAGNOSIS — N183 Chronic kidney disease, stage 3 (moderate): Secondary | ICD-10-CM | POA: Diagnosis present

## 2015-05-09 DIAGNOSIS — I129 Hypertensive chronic kidney disease with stage 1 through stage 4 chronic kidney disease, or unspecified chronic kidney disease: Secondary | ICD-10-CM | POA: Diagnosis present

## 2015-05-09 DIAGNOSIS — Z6841 Body Mass Index (BMI) 40.0 and over, adult: Secondary | ICD-10-CM | POA: Diagnosis not present

## 2015-05-09 DIAGNOSIS — F429 Obsessive-compulsive disorder, unspecified: Secondary | ICD-10-CM | POA: Diagnosis present

## 2015-05-09 DIAGNOSIS — Z915 Personal history of self-harm: Secondary | ICD-10-CM | POA: Diagnosis not present

## 2015-05-09 DIAGNOSIS — D509 Iron deficiency anemia, unspecified: Secondary | ICD-10-CM | POA: Diagnosis present

## 2015-05-09 DIAGNOSIS — E1122 Type 2 diabetes mellitus with diabetic chronic kidney disease: Secondary | ICD-10-CM | POA: Diagnosis present

## 2015-05-09 DIAGNOSIS — I10 Essential (primary) hypertension: Secondary | ICD-10-CM

## 2015-05-09 DIAGNOSIS — R45851 Suicidal ideations: Secondary | ICD-10-CM | POA: Diagnosis present

## 2015-05-09 DIAGNOSIS — Z7984 Long term (current) use of oral hypoglycemic drugs: Secondary | ICD-10-CM | POA: Diagnosis not present

## 2015-05-09 DIAGNOSIS — F332 Major depressive disorder, recurrent severe without psychotic features: Secondary | ICD-10-CM | POA: Diagnosis not present

## 2015-05-09 DIAGNOSIS — F339 Major depressive disorder, recurrent, unspecified: Secondary | ICD-10-CM | POA: Diagnosis present

## 2015-05-09 DIAGNOSIS — J45901 Unspecified asthma with (acute) exacerbation: Principal | ICD-10-CM | POA: Diagnosis present

## 2015-05-09 DIAGNOSIS — F4323 Adjustment disorder with mixed anxiety and depressed mood: Secondary | ICD-10-CM

## 2015-05-09 DIAGNOSIS — F411 Generalized anxiety disorder: Secondary | ICD-10-CM | POA: Diagnosis present

## 2015-05-09 DIAGNOSIS — E039 Hypothyroidism, unspecified: Secondary | ICD-10-CM | POA: Diagnosis present

## 2015-05-09 LAB — COMPREHENSIVE METABOLIC PANEL
ALBUMIN: 3 g/dL — AB (ref 3.5–5.0)
ALK PHOS: 34 U/L — AB (ref 38–126)
ALT: 12 U/L — ABNORMAL LOW (ref 14–54)
ANION GAP: 9 (ref 5–15)
AST: 17 U/L (ref 15–41)
BUN: 14 mg/dL (ref 6–20)
CALCIUM: 8.7 mg/dL — AB (ref 8.9–10.3)
CO2: 23 mmol/L (ref 22–32)
Chloride: 108 mmol/L (ref 101–111)
Creatinine, Ser: 1.28 mg/dL — ABNORMAL HIGH (ref 0.44–1.00)
GFR calc Af Amer: 58 mL/min — ABNORMAL LOW (ref >60)
GFR calc non Af Amer: 50 mL/min — ABNORMAL LOW (ref >60)
GLUCOSE: 140 mg/dL — AB (ref 65–99)
Potassium: 3.9 mmol/L (ref 3.5–5.1)
SODIUM: 140 mmol/L (ref 135–145)
Total Bilirubin: 0.4 mg/dL (ref 0.3–1.2)
Total Protein: 6.4 g/dL — ABNORMAL LOW (ref 6.5–8.1)

## 2015-05-09 LAB — CBC WITH DIFFERENTIAL/PLATELET
BASOS PCT: 0 %
Basophils Absolute: 0 10*3/uL (ref 0.0–0.1)
EOS ABS: 0.2 10*3/uL (ref 0.0–0.7)
Eosinophils Relative: 2 %
HEMATOCRIT: 27 % — AB (ref 36.0–46.0)
HEMOGLOBIN: 8.1 g/dL — AB (ref 12.0–15.0)
LYMPHS PCT: 37 %
Lymphs Abs: 3.3 10*3/uL (ref 0.7–4.0)
MCH: 22.1 pg — AB (ref 26.0–34.0)
MCHC: 30 g/dL (ref 30.0–36.0)
MCV: 73.8 fL — AB (ref 78.0–100.0)
Monocytes Absolute: 0.5 10*3/uL (ref 0.1–1.0)
Monocytes Relative: 5 %
NEUTROS ABS: 5 10*3/uL (ref 1.7–7.7)
Neutrophils Relative %: 56 %
Platelets: 516 10*3/uL — ABNORMAL HIGH (ref 150–400)
RBC: 3.66 MIL/uL — ABNORMAL LOW (ref 3.87–5.11)
RDW: 17.4 % — ABNORMAL HIGH (ref 11.5–15.5)
WBC: 9 10*3/uL (ref 4.0–10.5)

## 2015-05-09 LAB — BRAIN NATRIURETIC PEPTIDE: B Natriuretic Peptide: 37.2 pg/mL (ref 0.0–100.0)

## 2015-05-09 LAB — GLUCOSE, CAPILLARY
GLUCOSE-CAPILLARY: 127 mg/dL — AB (ref 65–99)
Glucose-Capillary: 116 mg/dL — ABNORMAL HIGH (ref 65–99)

## 2015-05-09 LAB — TROPONIN I
Troponin I: <0.03 ng/mL (ref <0.031)
Troponin I: <0.03 ng/mL (ref <0.031)

## 2015-05-09 MED ORDER — ALBUTEROL SULFATE (2.5 MG/3ML) 0.083% IN NEBU
2.5000 mg | INHALATION_SOLUTION | Freq: Once | RESPIRATORY_TRACT | Status: AC
  Administered 2015-05-09: 2.5 mg via RESPIRATORY_TRACT

## 2015-05-09 MED ORDER — GLIMEPIRIDE 4 MG PO TABS
4.0000 mg | ORAL_TABLET | Freq: Every day | ORAL | Status: DC
  Administered 2015-05-10 – 2015-05-12 (×3): 4 mg via ORAL
  Filled 2015-05-09 (×3): qty 1

## 2015-05-09 MED ORDER — SODIUM CHLORIDE 0.9% FLUSH
3.0000 mL | Freq: Two times a day (BID) | INTRAVENOUS | Status: DC
  Administered 2015-05-09 – 2015-05-11 (×5): 3 mL via INTRAVENOUS

## 2015-05-09 MED ORDER — SODIUM CHLORIDE 0.9 % IV SOLN: 250.0000 mL | INTRAVENOUS | Status: DC | PRN

## 2015-05-09 MED ORDER — IPRATROPIUM BROMIDE 0.02 % IN SOLN
0.5000 mg | Freq: Once | RESPIRATORY_TRACT | Status: AC
  Administered 2015-05-09: 0.5 mg via RESPIRATORY_TRACT

## 2015-05-09 MED ORDER — BUSPIRONE HCL 10 MG PO TABS
10.0000 mg | ORAL_TABLET | Freq: Two times a day (BID) | ORAL | Status: DC
  Administered 2015-05-09 – 2015-05-12 (×6): 10 mg via ORAL
  Filled 2015-05-09 (×6): qty 1

## 2015-05-09 MED ORDER — INSULIN ASPART 100 UNIT/ML ~~LOC~~ SOLN
0.0000 [IU] | Freq: Three times a day (TID) | SUBCUTANEOUS | Status: DC
  Administered 2015-05-10 (×2): 3 [IU] via SUBCUTANEOUS
  Administered 2015-05-10: 5 [IU] via SUBCUTANEOUS
  Administered 2015-05-11: 2 [IU] via SUBCUTANEOUS
  Administered 2015-05-11: 3 [IU] via SUBCUTANEOUS
  Administered 2015-05-11: 1 [IU] via SUBCUTANEOUS

## 2015-05-09 MED ORDER — PANTOPRAZOLE SODIUM 40 MG PO TBEC
40.0000 mg | DELAYED_RELEASE_TABLET | Freq: Two times a day (BID) | ORAL | Status: DC
  Administered 2015-05-10 – 2015-05-12 (×5): 40 mg via ORAL
  Filled 2015-05-09 (×5): qty 1

## 2015-05-09 MED ORDER — SODIUM CHLORIDE 0.9% FLUSH: 3.0000 mL | INTRAVENOUS | Status: DC | PRN

## 2015-05-09 MED ORDER — ALBUTEROL SULFATE (2.5 MG/3ML) 0.083% IN NEBU: 2.5000 mg | INHALATION_SOLUTION | Freq: Four times a day (QID) | RESPIRATORY_TRACT | Status: DC

## 2015-05-09 MED ORDER — FLUOXETINE HCL 20 MG PO CAPS
80.0000 mg | ORAL_CAPSULE | Freq: Every day | ORAL | Status: DC
  Administered 2015-05-09 – 2015-05-12 (×4): 80 mg via ORAL
  Filled 2015-05-09 (×4): qty 4

## 2015-05-09 MED ORDER — LEVOTHYROXINE SODIUM 100 MCG PO TABS
300.0000 ug | ORAL_TABLET | Freq: Every day | ORAL | Status: DC
  Administered 2015-05-10 – 2015-05-12 (×3): 300 ug via ORAL
  Filled 2015-05-09 (×3): qty 3

## 2015-05-09 MED ORDER — FERROUS SULFATE 325 (65 FE) MG PO TABS
325.0000 mg | ORAL_TABLET | Freq: Three times a day (TID) | ORAL | Status: DC
  Administered 2015-05-09: 325 mg via ORAL
  Filled 2015-05-09: qty 1

## 2015-05-09 MED ORDER — FLUTICASONE PROPIONATE 50 MCG/ACT NA SUSP
2.0000 | Freq: Every day | NASAL | Status: DC
  Administered 2015-05-10 – 2015-05-12 (×3): 2 via NASAL
  Filled 2015-05-09: qty 16

## 2015-05-09 MED ORDER — ALBUTEROL SULFATE (2.5 MG/3ML) 0.083% IN NEBU
2.5000 mg | INHALATION_SOLUTION | RESPIRATORY_TRACT | Status: DC | PRN
Start: 2015-05-09 — End: 2015-05-12
  Administered 2015-05-11 – 2015-05-12 (×2): 2.5 mg via RESPIRATORY_TRACT
  Filled 2015-05-09 (×2): qty 3

## 2015-05-09 MED ORDER — SENNOSIDES-DOCUSATE SODIUM 8.6-50 MG PO TABS
1.0000 | ORAL_TABLET | Freq: Every evening | ORAL | Status: DC | PRN
  Administered 2015-05-10: 1 via ORAL
  Filled 2015-05-09: qty 1

## 2015-05-09 MED ORDER — AMLODIPINE BESYLATE 10 MG PO TABS
10.0000 mg | ORAL_TABLET | Freq: Every day | ORAL | Status: DC
  Administered 2015-05-10 – 2015-05-12 (×3): 10 mg via ORAL
  Filled 2015-05-09 (×3): qty 1

## 2015-05-09 MED ORDER — IPRATROPIUM BROMIDE 0.02 % IN SOLN: 0.5000 mg | Freq: Four times a day (QID) | RESPIRATORY_TRACT | Status: DC

## 2015-05-09 MED ORDER — ENOXAPARIN SODIUM 80 MG/0.8ML ~~LOC~~ SOLN
80.0000 mg | SUBCUTANEOUS | Status: DC
  Administered 2015-05-09 – 2015-05-11 (×3): 80 mg via SUBCUTANEOUS
  Filled 2015-05-09 (×3): qty 0.8

## 2015-05-09 MED ORDER — LORAZEPAM 1 MG PO TABS
1.0000 mg | ORAL_TABLET | Freq: Three times a day (TID) | ORAL | Status: DC | PRN
  Administered 2015-05-10 – 2015-05-12 (×5): 1 mg via ORAL
  Filled 2015-05-09 (×5): qty 1

## 2015-05-09 MED ORDER — TRAZODONE HCL 150 MG PO TABS
150.0000 mg | ORAL_TABLET | Freq: Every day | ORAL | Status: DC
  Administered 2015-05-09 – 2015-05-11 (×3): 150 mg via ORAL
  Filled 2015-05-09 (×3): qty 1

## 2015-05-09 MED ORDER — PREDNISONE 50 MG PO TABS
50.0000 mg | ORAL_TABLET | Freq: Every day | ORAL | Status: DC
  Administered 2015-05-09 – 2015-05-12 (×4): 50 mg via ORAL
  Filled 2015-05-09 (×4): qty 1

## 2015-05-09 MED ORDER — IPRATROPIUM-ALBUTEROL 0.5-2.5 (3) MG/3ML IN SOLN
3.0000 mL | Freq: Four times a day (QID) | RESPIRATORY_TRACT | Status: DC
  Administered 2015-05-09 – 2015-05-11 (×7): 3 mL via RESPIRATORY_TRACT
  Filled 2015-05-09 (×8): qty 3

## 2015-05-09 MED ORDER — LURASIDONE HCL 20 MG PO TABS
20.0000 mg | ORAL_TABLET | Freq: Every day | ORAL | Status: DC
  Administered 2015-05-10 – 2015-05-11 (×2): 20 mg via ORAL
  Filled 2015-05-09 (×4): qty 1

## 2015-05-09 MED ORDER — LORATADINE 10 MG PO TABS
10.0000 mg | ORAL_TABLET | Freq: Every day | ORAL | Status: DC
  Administered 2015-05-10 – 2015-05-12 (×3): 10 mg via ORAL
  Filled 2015-05-09 (×3): qty 1

## 2015-05-09 MED ORDER — PANTOPRAZOLE SODIUM 40 MG PO TBEC
40.0000 mg | DELAYED_RELEASE_TABLET | Freq: Two times a day (BID) | ORAL | Status: DC
  Administered 2015-05-09: 40 mg via ORAL
  Filled 2015-05-09: qty 1

## 2015-05-09 MED ORDER — FERROUS SULFATE 325 (65 FE) MG PO TABS
325.0000 mg | ORAL_TABLET | Freq: Three times a day (TID) | ORAL | Status: DC
  Administered 2015-05-10 – 2015-05-12 (×6): 325 mg via ORAL
  Filled 2015-05-09 (×7): qty 1

## 2015-05-09 NOTE — Progress Notes (Signed)
   Subjective:   [name] is a 45 y.o. female with a history of HTN, asthma, hypothyroidism, T2 DM, previous suicide attempts here for shortness of breath.  Asthma - started 1-1.5 wks ago - coughing hard with some stress incontinence and post-tussive cough - difficult to sleep at night - using albuterol nebs q5h and inhaler q3h - thinks weather change pre-empted exacerbation - feels SOB with minimal walking and getting dressed - chest tightness present - no fevers - cough is productive of yellow phlegm - + wheezing - feels as though she cannot get enough air  Anxiety, depression - feels like she is at the end of her rope with being so sick - she denies current SI/HI, but feels like this is coming soon - no current plan for SI  Review of Systems:  Per HPI.   Social History: never smoker  Objective:  BP 163/90 mmHg  Pulse 96  Temp(Src) 98.6 F (37 C) (Oral)  Ht 5\' 10"  (1.778 m)  Wt 367 lb (166.47 kg)  BMI 52.66 kg/m2  SpO2 98%  Gen:  45 y.o. female with labored breathing HEENT: NCAT, MMM, EOMI, PERRL, anicteric sclerae CV: RRR, no MRG Resp: labored, no retractions, speaks in full sentences, diffuse expiratory wheezes, coarse crackles in upper lobes and diminished breath sounds in the bases Ext: WWP, 2+ pitting edema b/l MSK: No obvious deformities Neuro: Alert and oriented, speech normal Psych: Intermittently tearful, no AVH, anxious     Assessment & Plan:     [name] is a 46 y.o. female here for   Dyspnea Seems to be multifactorial Patient with wheezing and crackles in new edema on exam Patient given DuoNeb x1 and Solu-Medrol IM 125 g in clinic Patient needs chest x-ray and echo to rule out other causes of dyspnea Would also get EKG given risk factors and chest tightness Discussed with hospital team We'll directly admit patient to hospital  Adjustment disorder with mixed anxiety and depressed mood No current SI, but patient seems acutely  distressed and reports likely future SI This will be addressed during hospitalization as well Discussed with hospital team     Virginia Crews, MD MPH PGY-2,  [facility] 05/09/2015  11:39 AM

## 2015-05-09 NOTE — Progress Notes (Signed)
Pt arrived to the unit pt was very tearful and emotional. Stated she just feels suicidal and did not want me say anything. Pt informed on my role and keeping her safe. Md made aware to come speak with pt. Belongings taken from patient and room cleared.

## 2015-05-09 NOTE — Assessment & Plan Note (Signed)
>>  ASSESSMENT AND PLAN FOR ADJUSTMENT DISORDER WITH MIXED ANXIETY AND DEPRESSED MOOD WRITTEN ON 05/09/2015 11:39 AM BY Erasmo Downer, MD  No current SI, but patient seems acutely distressed and reports likely future SI This will be addressed during hospitalization as well Discussed with hospital team

## 2015-05-09 NOTE — H&P (Signed)
[facility] Admission History and Physical Service Pager: [phone]  Patient name: [name] Medical record number: [id number] Date of birth: Jan 17, 1971 Age: 45 y.o. Gender: female  Primary Care Provider: Lavon Paganini, MD Consultants: Psych  Code Status: FULL (pending psych evaluation as patient stated wishes to be DNR)  Chief Complaint: Dyspnea   Assessment and Plan: VIVIANNA [name] is a 45 y.o. female presenting with shortness of breath. Was a direct admit from clinic. PMH is significant for HTN, asthma, hypothyroidism, T2DM, and previous suicide attempts.   Dyspnea/Cough: Patient with diagnosis of late-onset asthma; however no documented PFTs in EMR. Potentially related to an infectious process. Well's score of 0, so low suspicion for a PE. Given new onset LE edema with SOB, some concern for CHF. Patient has never had an echocardiogram. Chest tightness/pain likely pleuritic in nature given association with inspiration/cough and reproducibility on exam. New sputum production. No fevers/chills/body aches. -admit to MedSurg, vitals per unit with pulse ox checks  -duonebs q6h and albuterol neb q2h prn  -O2 2L prn to keep O2 sats >92%  -fluticasone and claritin ordered  -CXR to evaluate for potential PNA >> No evidence of consolidation or congestion. -if CXR negative, will begin steroids for probable asthma exacerbation  - Initiating Prednisone 42m PO x 5 days. -EKG and troponin given report of chest tightness -Echo and BNP to evaluate for CHF  -CBC and BMET ordered   Anxiety/Depression/History of Suicide Attempts: Patient endorses actively having SI. Currently taking Buspar 10 mg BID, Prozac 80 mg daily, Latuda 20 mg daily, Trazodone 150 mg nightly, and Ativan 1 mg prn TID.  -psych consult placed  -continue home medication regimen  -suicide precautions with sitter in place   HTN: Stable at admission. Taking Amlodipine 10 mg at home.  -continue  amlodipine >> consider changing therapy if this could be the cause for her peripheral edema.  T2DM: Last HgB A1C 7.0 in 1/17.  -continue home glimepiride  -sensitive SSI  -CBGs AC/HS  -HgB A1C ordered   Hypothyroidism: Last TSH elevated to 7.1 in 1/17.  -continue home levothyroxine 300 mcg   - There is documentation stating concern for noncompliance w/ medications, which could be the cause of her elevated TSH.  Anemia: Patient with h/o of chronic microcytic iron deficiency anemia. Reports that she recently started taking Fe supplements again a few weeks ago. Last HgB from CBC in 04/10/15 was 8.2.  -monitor HgB on CBC  -continue Fe supplement  -continue sennakot-s for Fe induced constipation   CKD III:  - CMP pending at this time.  - Avoid nephrotoxic medications.  - Monitor.  FEN/GI: Heart Healthy/Carb Modified Diet, SLIV  Prophylaxis: Lovenox   Disposition: Admit to MedSurg for dyspnea; FPTS; Attending WMingo Amber  History of Present Illness:  Samantha MCLEESis a 45y.o. female presenting with cough x 8 days. Reports productive cough with thick yellow sputum. Denies hemoptysis. Cough occurs all day and all night. Not associated with any particular time of day or positional changes. Chest tightness is present and feels a stabbing pain on her sides when she coughs or takes a deep breath. Has SOB with minimal exertion such as getting dressed in the morning. She has coughed so hard she has had post-tussive emesis and urinary incontinence. Has been afebrile at home. She has been using albuterol nebs about 4-6 times per day and has been using her albuterol inhaler every couple of hours. Believes that a change in  weather might have led to her cough. 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   Patient has a history of anxiety and depression. Admits that she has been having suicidal ideations. Feels overwhelmed  and scared about being sick for so long. Also reports fears of financial burden with this particular hospitalization.   Review Of Systems: Per HPI with the following additions: No diarrhea, headaches or changes in vision.  Otherwise the remainder of the systems were negative.  Patient Active Problem List   Diagnosis Date Noted  . Dyspnea 05/09/2015  . Vitamin D deficiency 04/11/2015  . Fatigue 04/10/2015  . Non compliance w medication regimen 04/10/2015  . Golfer's elbow 12/22/2014  . Asthma exacerbation 09/22/2014  . Type 2 diabetes mellitus with complication ([place])   . Thyroid activity decreased   . Adjustment disorder with mixed anxiety and depressed mood   . Cough 09/20/2014  . Asthma with acute exacerbation 06/22/2014  . Grief at loss of child 06/22/2014  . Dysmenorrhea 11/25/2013  . Frequent urination 10/20/2012  . Furuncle 07/07/2012  . Suicidal ideation 05/27/2011  . Chronic kidney disease (CKD), stage III (moderate) 11/06/2010  . ENDOMETRIAL HYPERPLASIA UNSPECIFIED 02/07/2008  . MENORRHAGIA 01/03/2008  . Allergic rhinitis 06/24/2007  . Essential hypertension, benign 02/03/2007  . Hypothyroidism 05/07/2006  . Type II diabetes mellitus with complication ([place]) 17/51/0258  . Morbid obesity ([place]) 05/07/2006  . Iron deficiency anemia 05/07/2006  . Major depressive disorder, recurrent episode ([place]) 05/07/2006  . Anxiety state 05/07/2006  . OBSESSIVE COMPUL. DISORDER 05/07/2006    Past Medical History: Past Medical History  Diagnosis Date  . Suicide attempt by drug ingestion ([place]) 06/05/10    result rhabdomyolosis and ARF requrining dialysis   . Rhabdomyolysis 06/06/10    after drug overdose  . Acute renal failure ([place]) 05/27/10    hemodialysis for 6 weeks  . Anxiety   . Diabetes mellitus   . Depression   . Hypertension     Past Surgical History: Past Surgical History  Procedure Laterality Date  . Tubal ligation  1998  . Cholecystectomy      Social  History: Social History  Substance Use Topics  . Smoking status: Never Smoker   . Smokeless tobacco: Not on file  . Alcohol Use: No   Additional social history: None  Please also refer to relevant sections of EMR.  Family History: Patient is unaware of any family history of clotting disorders.   Allergies and Medications: Allergies  Allergen Reactions  . Ace Inhibitors Other (See Comments)    Patient had acute renal failure requiring hemodialysis after a suicide attempt.  Nephro recommended avoiding use.  . Haldol [Haloperidol Lactate]     Tardive dyskinesia  . Latex   . Metformin And Related Other (See Comments)    Used in her suicide attempt that contributed to severe acute renal failure  . Nsaids Other (See Comments)    Nephro recommended avoiding after acute renal failure requiring hemodialysis associated with a suicide attempt.   No current facility-administered medications on file prior to encounter.   Current Outpatient Prescriptions on File Prior to Encounter  Medication Sig Dispense Refill  . albuterol (PROAIR HFA) 108 (90 Base) MCG/ACT inhaler Inhale 1-2 puffs into the lungs every 6 (six) hours as needed for wheezing or shortness of breath. 1 Inhaler 2  . albuterol (PROVENTIL) (2.5 MG/3ML) 0.083% nebulizer solution Take 3 mLs (2.5  mg total) by nebulization every 4 (four) hours as needed for wheezing or shortness of breath. 150 mL 3  . amLODipine (NORVASC) 10 MG tablet Take 1 tablet (10 mg total) by mouth daily. 90 tablet 3  . benzonatate (TESSALON) 100 MG capsule Take 1 capsule (100 mg total) by mouth 2 (two) times daily. 20 capsule 0  . Blood Glucose Monitoring Suppl (ONE TOUCH ULTRA SYSTEM KIT) w/Device KIT Check CBG's once daily and prn.  Please substitute glucometer and lancets per formulary. 1 each 0  . busPIRone (BUSPAR) 10 MG tablet Take 10 mg by mouth 2 (two) times daily.    . cetirizine (ZYRTEC) 10 MG tablet Take 1 tablet (10 mg total) by mouth daily. 30 tablet  11  . cloNIDine (CATAPRES) 0.1 MG tablet Take 0.1 mg by mouth as needed (anxiety).    . ferrous sulfate 325 (65 FE) MG tablet Take 1 tablet (325 mg total) by mouth 3 (three) times daily with meals. 90 tablet 3  . FLUoxetine (PROZAC) 40 MG capsule Take 80 mg by mouth daily.     . fluticasone (FLONASE) 50 MCG/ACT nasal spray Place 2 sprays into both nostrils daily. 16 g 2  . glimepiride (AMARYL) 2 MG tablet Take 2 tablets (4 mg total) by mouth daily before breakfast. 180 tablet 3  . glucose blood (ONE TOUCH ULTRA TEST) test strip Check sugar every morning and 2 hours after your biggest meal of the day 100 each 12  . guaiFENesin-dextromethorphan (ROBITUSSIN DM) 100-10 MG/5ML syrup Take 5 mLs by mouth every 4 (four) hours as needed for cough. 118 mL 0  . levothyroxine (SYNTHROID, LEVOTHROID) 300 MCG tablet Take 1 tablet (300 mcg total) by mouth daily. 90 tablet 3  . LORazepam (ATIVAN) 1 MG tablet Take 1 tablet (1 mg total) by mouth 3 (three) times daily as needed for anxiety. Do not fill before 30 days after last refill. 90 tablet 0  . LORazepam (ATIVAN) 1 MG tablet Take 1 tablet (1 mg total) by mouth 3 (three) times daily as needed for anxiety. Do not fill before 30 days after last refill. 90 tablet 0  . LORazepam (ATIVAN) 1 MG tablet Take 1 tablet (1 mg total) by mouth 3 (three) times daily as needed for anxiety. Do not fill before 30 days after last refill. 90 tablet 0  . ONETOUCH DELICA LANCETS 69G MISC Use as directed to test blood glucose once daily. 100 each 3  . pantoprazole (PROTONIX) 40 MG tablet Take 1 tablet (40 mg total) by mouth 2 (two) times daily. 60 tablet 2  . traZODone (DESYREL) 100 MG tablet Take 150 mg by mouth at bedtime.     . Vitamin D, Ergocalciferol, (DRISDOL) 50000 units CAPS capsule Take 1 capsule (50,000 Units total) by mouth every 7 (seven) days. 4 capsule 1    Objective: BP 147/95 mmHg  Temp(Src) 98.9 F (37.2 C) (Oral)  Ht '5\' 11"'  (1.803 m)  Wt 365 lb 8 oz (165.79  kg)  BMI 51.00 kg/m2  SpO2 98% Exam: General: obese female with labored breathing, tearful Eyes: EOMI. Pupils equal and round.  ENTM: MMM.  Cardiovascular: RRR. No murmurs appreciated. 2+ pedal pulses. 1-2+ pitting edema bilaterally to mid-shin R slightly > L, chest pain reproducible with palpation and deep breathing on exam  Respiratory: Labored especially with speaking full sentences. Diffuse expiratory wheezes. Diminished breath sounds at the bases. No retractions appreciated.  Abdomen: +BS, soft, NT, obese MSK: Moves all 4 extremities spontaneously with  no gross deformities.  Skin: No rashes appreciated. Warm and dry.  Neuro: Alert and oriented. No gross deficits.  Psych: Intermittently tearful throughout. Anxious appearing.   Labs and Imaging: CBC BMET  No results for input(s): WBC, HGB, HCT, PLT in the last 168 hours. No results for input(s): NA, K, CL, CO2, BUN, CREATININE, GLUCOSE, CALCIUM in the last 168 hours.    Nicolette Bang, DO 05/09/2015, 12:55 PM PGY-1, [facility] Intern pager: [phone], text pages welcome    Upper Level Addendum:  I have seen and evaluated this patient along with Dr. Randolm Idol and reviewed the above note, making necessary revisions in red.   Elberta Leatherwood, MD,MS,  PGY2 05/09/2015 3:52 PM

## 2015-05-09 NOTE — Assessment & Plan Note (Signed)
No current SI, but patient seems acutely distressed and reports likely future SI This will be addressed during hospitalization as well Discussed with hospital team

## 2015-05-09 NOTE — Assessment & Plan Note (Signed)
Seems to be multifactorial Patient with wheezing and crackles in new edema on exam Patient given DuoNeb x1 and Solu-Medrol IM 125 g in clinic Patient needs chest x-ray and echo to rule out other causes of dyspnea Would also get EKG given risk factors and chest tightness Discussed with hospital team We'll directly admit patient to hospital

## 2015-05-10 ENCOUNTER — Inpatient Hospital Stay (HOSPITAL_COMMUNITY): Payer: BC Managed Care – PPO

## 2015-05-10 DIAGNOSIS — R0600 Dyspnea, unspecified: Secondary | ICD-10-CM

## 2015-05-10 LAB — BASIC METABOLIC PANEL
ANION GAP: 11 (ref 5–15)
BUN: 15 mg/dL (ref 6–20)
CHLORIDE: 105 mmol/L (ref 101–111)
CO2: 22 mmol/L (ref 22–32)
CREATININE: 1.26 mg/dL — AB (ref 0.44–1.00)
Calcium: 9.1 mg/dL (ref 8.9–10.3)
GFR calc non Af Amer: 51 mL/min — ABNORMAL LOW (ref >60)
GFR, EST AFRICAN AMERICAN: 59 mL/min — AB (ref >60)
Glucose, Bld: 194 mg/dL — ABNORMAL HIGH (ref 65–99)
POTASSIUM: 4.7 mmol/L (ref 3.5–5.1)
SODIUM: 138 mmol/L (ref 135–145)

## 2015-05-10 LAB — CBC
HCT: 28.4 % — ABNORMAL LOW (ref 36.0–46.0)
HEMOGLOBIN: 8.2 g/dL — AB (ref 12.0–15.0)
MCH: 21.4 pg — ABNORMAL LOW (ref 26.0–34.0)
MCHC: 28.9 g/dL — ABNORMAL LOW (ref 30.0–36.0)
MCV: 74 fL — AB (ref 78.0–100.0)
PLATELETS: 507 10*3/uL — AB (ref 150–400)
RBC: 3.84 MIL/uL — AB (ref 3.87–5.11)
RDW: 17.3 % — ABNORMAL HIGH (ref 11.5–15.5)
WBC: 9.6 10*3/uL (ref 4.0–10.5)

## 2015-05-10 LAB — GLUCOSE, CAPILLARY
GLUCOSE-CAPILLARY: 241 mg/dL — AB (ref 65–99)
Glucose-Capillary: 284 mg/dL — ABNORMAL HIGH (ref 65–99)
Glucose-Capillary: 239 mg/dL — ABNORMAL HIGH (ref 65–99)
Glucose-Capillary: 201 mg/dL — ABNORMAL HIGH (ref 65–99)

## 2015-05-10 LAB — HEMOGLOBIN A1C
Hgb A1c MFr Bld: 8.1 % — ABNORMAL HIGH (ref 4.8–5.6)
Mean Plasma Glucose: 186 mg/dL

## 2015-05-10 LAB — TROPONIN I

## 2015-05-10 MED ORDER — ACETAMINOPHEN 500 MG PO TABS
1000.0000 mg | ORAL_TABLET | Freq: Three times a day (TID) | ORAL | Status: DC | PRN
  Administered 2015-05-10 – 2015-05-12 (×2): 1000 mg via ORAL
  Filled 2015-05-10 (×2): qty 2

## 2015-05-10 MED ORDER — PERFLUTREN LIPID MICROSPHERE
1.0000 mL | INTRAVENOUS | Status: AC | PRN
  Administered 2015-05-10: 2 mL via INTRAVENOUS
  Filled 2015-05-10: qty 10

## 2015-05-10 NOTE — Progress Notes (Signed)
Inpatient Diabetes Program Recommendations  AACE/ADA: New Consensus Statement on Inpatient Glycemic Control (2015)  Target Ranges:  Prepandial:   less than 140 mg/dL      Peak postprandial:   less than 180 mg/dL (1-2 hours)      Critically ill patients:  140 - 180 mg/dL   Review of Glycemic ControlResults for LEINANI, URBANSKI (MRN [id number]) as of 05/10/2015 12:31  Ref. Range 05/09/2015 17:37 05/09/2015 21:58 05/10/2015 05:35 05/10/2015 11:01  Glucose-Capillary Latest Ref Range: 65-99 mg/dL 116 (H) 127 (H) 241 (H) 284 (H)    Diabetes history: Type 2 diabetes Outpatient Diabetes medications: Amaryl 4 mg daily Current orders for Inpatient glycemic control:  Novolog sensitive tid with meals, Amaryl 4 mg daily with breakfast, Prednisone 50 mg daily  Inpatient Diabetes Program Recommendations:    While on steroids, consider adding Novolog meal coverage 4 units tid with meals.  Thanks, Adah Perl, RN, BC-ADM Inpatient Diabetes Coordinator Pager [phone] (8a-5p)

## 2015-05-10 NOTE — Progress Notes (Signed)
  Echocardiogram 2D Echocardiogram has been performed.  Jennette Dubin 05/10/2015, 8:47 AM

## 2015-05-10 NOTE — Progress Notes (Signed)
Family Medicine Teaching Service Daily Progress Note Intern Pager: [phone]  Patient name: [name] Medical record number: [id number] Date of birth: [birth year] Age: 45 y.o. Gender: female  Primary Care Provider: Lavon Paganini, MD Consultants: Psych  Code Status: FULL (pending psych evaluation as patient stated wishes to be DNR)  Pt Overview and Major Events to Date:  3/1: Patient admitted to [facility] for dyspnea   Assessment and Plan: [name] is a 45 y.o. female presenting with shortness of breath. Was a direct admit from clinic. PMH is significant for HTN, asthma, hypothyroidism, T2DM, and previous suicide attempts.   Dyspnea/Cough: Patient with diagnosis of late-onset asthma; however no documented PFTs in EMR. Potentially related to an infectious process. Well's score of 0, so low suspicion for a PE. Given new onset LE edema with SOB, some concern for CHF. Patient has never had an echocardiogram. Chest tightness/pain likely pleuritic in nature given association with inspiration/cough and reproducibility on exam. New sputum production. No fevers/chills/body aches. CXR was without evidence of consolidation or congestion.  -admit to MedSurg, vitals per unit with pulse ox checks  -duonebs q6h and albuterol neb q2h prn  -O2 2L prn to keep O2 sats >92%; currently with appropriate O2 saturations on RA   -fluticasone and claritin ordered  -Initiating Prednisone 50mg  PO x 5 days (day 2/5) to treat for probable asthma exacerbation  -EKG and troponin given report of chest tightness: troponin <0.03 and EKG normal sinus rhythm  -to evaluate for CHF: BNP 37.2, echo pending    Anxiety/Depression/History of Suicide Attempts: Patient endorses actively having SI. Currently taking Buspar 10 mg BID, Prozac 80 mg daily, Latuda 20 mg daily, Trazodone 150 mg nightly, and Ativan 1 mg prn TID.  -psych consult placed  -continue home medication regimen  -suicide precautions with sitter in  place   HTN: Stable at admission. Taking Amlodipine 10 mg at home.  -continue amlodipine >> consider changing therapy if this could be the cause for her peripheral edema  T2DM: Last HgB A1C 7.0 in 1/17.  -continue home glimepiride  -sensitive SSI  -CBGs AC/HS  -HgB A1C worsened to 8.1   Hypothyroidism: Last TSH elevated to 7.1 in 1/17. There is documentation stating concern for noncompliance w/ medications, which could be the cause of her elevated TSH. -continue home levothyroxine 300 mcg    Anemia: Patient with h/o of chronic microcytic iron deficiency anemia. Reports that she recently started taking Fe supplements again a few weeks ago. Last HgB from CBC in 04/10/15 was 8.2.  -monitor HgB on CBC  -continue Fe supplement  -continue sennakot-s for Fe induced constipation   CKD III: Cr 1.26, which is consistent with patient's baseline kidney function.  - Avoid nephrotoxic medications.  - Monitor Cr  -patient also with low albumin which could be contributing to her edema   FEN/GI: Heart Healthy/Carb Modified Diet, SLIV  Prophylaxis: Lovenox   Disposition: Home pending clinical improvement   Subjective:  Patient reports that cough is still bad but she feels like wheezing has improved. Also says that her thoughts/mood has improved today and that yesterday she was "just feeling very overwhelmed with everything".  Objective: Temp:  [98.3 F (36.8 C)-98.9 F (37.2 C)] 98.6 F (37 C) (03/02 0402) Pulse Rate:  [78-96] 93 (03/02 0402) Resp:  [18-20] 20 (03/02 0402) BP: (108-163)/(46-95) 127/93 mmHg (03/02 0402) SpO2:  [95 %-100 %] 95 % (03/02 0402) Weight:  [365 lb 8 oz (165.79 kg)-367 lb (166.47 kg)] 365 lb  14.4 oz (165.971 kg) (03/02 0402) Physical Exam: General: obese female sitting up in bed in NAD Cardiovascular: RRR. No murmurs appreciated.  Respiratory: O2 not in place. Diminished respiratory effort but less wheezing appreciated and air movement mildly  improved at bases. Able to speak in full sentences.  Abdomen: Obese, soft, NT  Extremities: 1+ LE edema to mid-shin   Laboratory:  Recent Labs Lab 05/09/15 1432 05/10/15 0238  WBC 9.0 9.6  HGB 8.1* 8.2*  HCT 27.0* 28.4*  PLT 516* 507*    Recent Labs Lab 05/09/15 1432 05/10/15 0238  NA 140 138  K 3.9 4.7  CL 108 105  CO2 23 22  BUN 14 15  CREATININE 1.28* 1.26*  CALCIUM 8.7* 9.1  PROT 6.4*  --   BILITOT 0.4  --   ALKPHOS 34*  --   ALT 12*  --   AST 17  --   GLUCOSE 140* 194*   BNP: 37.2 HgB A1C: 8.1  Imaging/Diagnostic Tests: X-ray Chest Pa And Lateral  05/09/2015  CLINICAL DATA:  One-day history of dyspnea. EXAM: CHEST  2 VIEW COMPARISON:  09/22/2014. FINDINGS: The lungs are clear wiithout focal pneumonia, edema, pneumothorax or pleural effusion. Cardiopericardial silhouette is at upper limits of normal for size. The visualized bony structures of the thorax are intact. Telemetry leads overlie the chest. IMPRESSION: No active cardiopulmonary disease. Electronically Signed   By: [name] M.D.   On: 05/09/2015 15:27   EKG: Normal sinus rhythm   Samantha Bang, DO 05/10/2015, 7:19 AM PGY-1, [facility] Intern pager: [phone], text pages welcome

## 2015-05-11 ENCOUNTER — Encounter (HOSPITAL_COMMUNITY): Payer: Self-pay | Admitting: Nurse Practitioner

## 2015-05-11 DIAGNOSIS — F332 Major depressive disorder, recurrent severe without psychotic features: Secondary | ICD-10-CM | POA: Diagnosis present

## 2015-05-11 LAB — BASIC METABOLIC PANEL
Anion gap: 9 (ref 5–15)
BUN: 18 mg/dL (ref 6–20)
CALCIUM: 9.1 mg/dL (ref 8.9–10.3)
CHLORIDE: 104 mmol/L (ref 101–111)
CO2: 26 mmol/L (ref 22–32)
CREATININE: 1.37 mg/dL — AB (ref 0.44–1.00)
GFR calc Af Amer: 53 mL/min — ABNORMAL LOW (ref >60)
GFR calc non Af Amer: 46 mL/min — ABNORMAL LOW (ref >60)
Glucose, Bld: 149 mg/dL — ABNORMAL HIGH (ref 65–99)
Potassium: 3.8 mmol/L (ref 3.5–5.1)
Sodium: 139 mmol/L (ref 135–145)

## 2015-05-11 LAB — CBC
HEMATOCRIT: 25.8 % — AB (ref 36.0–46.0)
HEMOGLOBIN: 7.5 g/dL — AB (ref 12.0–15.0)
MCH: 21.4 pg — AB (ref 26.0–34.0)
MCHC: 29.1 g/dL — AB (ref 30.0–36.0)
MCV: 73.5 fL — ABNORMAL LOW (ref 78.0–100.0)
Platelets: 496 10*3/uL — ABNORMAL HIGH (ref 150–400)
RBC: 3.51 MIL/uL — ABNORMAL LOW (ref 3.87–5.11)
RDW: 17.2 % — AB (ref 11.5–15.5)
WBC: 11.9 10*3/uL — ABNORMAL HIGH (ref 4.0–10.5)

## 2015-05-11 LAB — GLUCOSE, CAPILLARY
Glucose-Capillary: 164 mg/dL — ABNORMAL HIGH (ref 65–99)
Glucose-Capillary: 139 mg/dL — ABNORMAL HIGH (ref 65–99)
Glucose-Capillary: 228 mg/dL — ABNORMAL HIGH (ref 65–99)
Glucose-Capillary: 174 mg/dL — ABNORMAL HIGH (ref 65–99)

## 2015-05-11 MED ORDER — IPRATROPIUM-ALBUTEROL 0.5-2.5 (3) MG/3ML IN SOLN: 3.0000 mL | RESPIRATORY_TRACT | Status: DC | PRN

## 2015-05-11 MED ORDER — BUDESONIDE 0.25 MG/2ML IN SUSP
0.2500 mg | Freq: Two times a day (BID) | RESPIRATORY_TRACT | Status: DC
  Administered 2015-05-11 – 2015-05-12 (×2): 0.25 mg via RESPIRATORY_TRACT
  Filled 2015-05-11 (×2): qty 2

## 2015-05-11 MED ORDER — BUDESONIDE 0.25 MG/2ML IN SUSP: 0.2500 mg | Freq: Two times a day (BID) | RESPIRATORY_TRACT | Status: DC

## 2015-05-11 NOTE — Progress Notes (Signed)
Family Medicine Teaching Service Daily Progress Note Intern Pager: [phone]  Patient name: [name] Medical record number: [id number] Date of birth: 10/31/1970 Age: 45 y.o. Gender: female  Primary Care Provider: Lavon Paganini, MD Consultants: Psych  Code Status: FULL (pending psych evaluation as patient stated wishes to be DNR)  Pt Overview and Major Events to Date:  3/1: Patient admitted to [facility] for dyspnea   Assessment and Plan: [name] is a 45 y.o. female presenting with shortness of breath. Was a direct admit from clinic. PMH is significant for HTN, asthma, hypothyroidism, T2DM, and previous suicide attempts.   Dyspnea/Cough likely 2/2 asthma exacerbation, improved: Patient with diagnosis of late-onset asthma; however no documented PFTs in EMR. Potentially related to an infectious process. Well's score of 0, so low suspicion for a PE. Given new onset LE edema with SOB, some concern for CHF. Patient has never had an echocardiogram. Chest tightness/pain likely pleuritic in nature given association with inspiration/cough and reproducibility on exam. New sputum production. No fevers/chills/body aches. CXR was without evidence of consolidation or congestion.  -duonebs q6h and albuterol neb q2h prn  -O2 2L prn to keep O2 sats >92%; currently with appropriate O2 saturations on RA   -fluticasone and claritin  - Prednisone 50mg  PO x 5 days (day 3/5) to treat for probable asthma exacerbation  - EKG and troponin given report of chest tightness: troponin <0.03 x 3 and EKG normal sinus rhythm  - to evaluate for CHF: BNP 37.2, ECHO with normal EF with G1DD  Anxiety/Depression/History of Suicide Attempts: Patient endorses actively having SI. Currently taking Buspar 10 mg BID, Prozac 80 mg daily, Latuda 20 mg daily, Trazodone 150 mg nightly, and Ativan 1 mg prn TID.  -psych consult placed: no changes to current meds; will follow up outpatient  -continue home medication regimen     HTN: Stable at admission. Taking Amlodipine 10 mg at home.  -continue amlodipine  T2DM: Last HgB A1C 7.0 in 1/17.  -continue home glimepiride  -sensitive SSI  -CBGs AC/HS  -HgB A1C worsened to 8.1> will defer to PCP   Hypothyroidism: Last TSH elevated to 7.1 in 1/17. There is documentation stating concern for noncompliance w/ medications, which could be the cause of her elevated TSH. -continue home levothyroxine 300 mcg   Anemia: Patient with h/o of chronic microcytic iron deficiency anemia. Reports that she recently started taking Fe supplements again a few weeks ago. Last HgB from CBC in 04/10/15 was 8.2.  -monitor HgB on CBC  -continue Fe supplement  -continue sennakot-s for Fe induced constipation   CKD III: Cr 1.26, which is consistent with patient's baseline kidney function.  - Avoid nephrotoxic medications.  - Monitor Cr  -patient also with low albumin which could be contributing to her edema   FEN/GI: Heart Healthy/Carb Modified Diet, SLIV  Prophylaxis: Lovenox   Disposition: discharge today  Subjective:  Feels ready to go home. Did have some coughing last night, but feels much improved overall  Objective: Temp:  [98.4 F (36.9 C)-99.3 F (37.4 C)] 98.4 F (36.9 C) (03/03 0553) Pulse Rate:  [84-110] 110 (03/03 0553) Resp:  [16-18] 18 (03/03 0553) BP: (115-140)/(58-90) 140/90 mmHg (03/03 0553) SpO2:  [93 %-100 %] 98 % (03/03 0553) Weight:  [164.293 kg (362 lb 3.2 oz)] 164.293 kg (362 lb 3.2 oz) (03/03 0553) Physical Exam: General: obese female sitting up in bed in NAD Cardiovascular: RRR. No murmurs appreciated.  Respiratory: on room air, normal effort, minimal wheezing appreciated  bilaterally. Able to speak in full sentences.  Abdomen: Obese, soft, NT  Extremities: no LE edema  Laboratory:  Recent Labs Lab 05/09/15 1432 05/10/15 0238  WBC 9.0 9.6  HGB 8.1* 8.2*  HCT 27.0* 28.4*  PLT 516* 507*    Recent Labs Lab 05/09/15 1432  05/10/15 0238  NA 140 138  K 3.9 4.7  CL 108 105  CO2 23 22  BUN 14 15  CREATININE 1.28* 1.26*  CALCIUM 8.7* 9.1  PROT 6.4*  --   BILITOT 0.4  --   ALKPHOS 34*  --   ALT 12*  --   AST 17  --   GLUCOSE 140* 194*   BNP: 37.2 HgB A1C: 8.1  Imaging/Diagnostic Tests: ECHO: EF 50-55%, G1DD  X-ray Chest Pa And Lateral  05/09/2015  CLINICAL DATA:  One-day history of dyspnea. EXAM: CHEST  2 VIEW COMPARISON:  09/22/2014. FINDINGS: The lungs are clear wiithout focal pneumonia, edema, pneumothorax or pleural effusion. Cardiopericardial silhouette is at upper limits of normal for size. The visualized bony structures of the thorax are intact. Telemetry leads overlie the chest. IMPRESSION: No active cardiopulmonary disease. Electronically Signed   By: Misty Stanley M.D.   On: 05/09/2015 15:27   EKG: Normal sinus rhythm   Smiley Houseman, MD 05/11/2015, 7:13 AM PGY-1, [facility] Intern pager: [phone], text pages welcome

## 2015-05-11 NOTE — Discharge Summary (Signed)
[facility] Discharge Summary  Patient name: [name] Medical record number: [id number] Date of birth: 28-May-1970 Age: 45 y.o. Gender: female Date of Admission: 05/09/2015  Date of Discharge: 05/12/2015 Admitting Physician: Leeanne Rio, MD  Primary Care Provider: Lavon Paganini, MD Consultants: Psychiatry   Indication for Hospitalization: Asthma Exacerbation   Discharge Diagnoses/Problem List:  Patient Active Problem List   Diagnosis Date Noted  . MDD (major depressive disorder), recurrent severe, without psychosis ([place]) 05/11/2015  . Dyspnea 05/09/2015  . Asthma with exacerbation   . CKD (chronic kidney disease)   . Essential hypertension   . Vitamin D deficiency 04/11/2015  . Fatigue 04/10/2015  . Non compliance w medication regimen 04/10/2015  . Golfer's elbow 12/22/2014  . Asthma exacerbation 09/22/2014  . Type 2 diabetes mellitus with complication ([place])   . Thyroid activity decreased   . Adjustment disorder with mixed anxiety and depressed mood   . Cough 09/20/2014  . Asthma with acute exacerbation 06/22/2014  . Grief at loss of child 06/22/2014  . Dysmenorrhea 11/25/2013  . Frequent urination 10/20/2012  . Furuncle 07/07/2012  . Suicidal ideation 05/27/2011  . Chronic kidney disease (CKD), stage III (moderate) 11/06/2010  . ENDOMETRIAL HYPERPLASIA UNSPECIFIED 02/07/2008  . MENORRHAGIA 01/03/2008  . Allergic rhinitis 06/24/2007  . Essential hypertension, benign 02/03/2007  . Hypothyroidism 05/07/2006  . Type II diabetes mellitus with complication ([place]) [id number]  . Morbid obesity ([place]) 05/07/2006  . Iron deficiency anemia 05/07/2006  . Major depressive disorder, recurrent episode ([place]) 05/07/2006  . Anxiety state 05/07/2006  . OBSESSIVE COMPUL. DISORDER 05/07/2006    Disposition: Home   Discharge Condition: Improved   Discharge Exam: Please See Last Progress Note   Brief Hospital Course:  [name] is 45  y.o. female with PMH of T2DM, hypothyroidism, HTN, CKD III, depression/anxiety/OCD with past suicide attempts, and asthma who was admitted from to the hospital for dyspnea after having been found to have SOB and wheezing in the clinic.   Dyspnea: CXR was obtained and was negative for consolidation or congestion. BNP and echo were ordered to evaluate for cardiac etiology of dyspnea given endorsement of orthopnea and new LE edema. BNP was WNL and echo was with normal EF and G1DD. Patient was treated for presumed asthma exacerbation with duonebs and started on 5 day course of prednisone. On day 2 of hospital course, patient was able to maintain her O2 saturations while ambulating however she had increased cough, dyspnea, and wheezing. Given degree of dyspnea and prior history of admission for asthma exacerbation, she was started on pulmicort for controller medication. The following day she was able to ambulate without significant breathing difficulties.   Suicidal Ideations: At admission patient reported current suicidal ideations. Suicide precautions were put in place and her home psych medications were continued. On day 2 of hospitalization, psychiatry evaluated the patient. Their evaluation found there to be no safety concerns for the patient and patient denied active SI. No medication adjustments were made.   Issues for Follow Up:  1. Patient with diagnosis of asthma without any record of PFTs. Would recommend scheduling PFTs as outpatient.  2. Patient should take her last dose of prednisone on 3/5 to complete a 5 day course.  3. Discharged with Pulmicort Inhaler. Ensure compliance and continued asthma education.  4. HgB A1C worsened to 8.1 from 7.0 two months ago.   Significant Procedures: None   Significant Labs and Imaging:   Recent Labs Lab 05/09/15 1432  05/10/15 0238 05/11/15 0751  WBC 9.0 9.6 11.9*  HGB 8.1* 8.2* 7.5*  HCT 27.0* 28.4* 25.8*  PLT 516* 507* 496*    Recent Labs Lab  05/09/15 1432 05/10/15 0238 05/11/15 0751  NA 140 138 139  K 3.9 4.7 3.8  CL 108 105 104  CO2 23 22 26   GLUCOSE 140* 194* 149*  BUN 14 15 18   CREATININE 1.28* 1.26* 1.37*  CALCIUM 8.7* 9.1 9.1  ALKPHOS 34*  --   --   AST 17  --   --   ALT 12*  --   --   ALBUMIN 3.0*  --   --    X-ray Chest Pa And Lateral  05/09/2015  CLINICAL DATA:  One-day history of dyspnea. EXAM: CHEST  2 VIEW COMPARISON:  09/22/2014. FINDINGS: The lungs are clear wiithout focal pneumonia, edema, pneumothorax or pleural effusion. Cardiopericardial silhouette is at upper limits of normal for size. The visualized bony structures of the thorax are intact. Telemetry leads overlie the chest. IMPRESSION: No active cardiopulmonary disease. Electronically Signed   By: Misty Stanley M.D.   On: 05/09/2015 15:27    Results/Tests Pending at Time of Discharge: None   Discharge Medications:    Medication List    STOP taking these medications        albuterol (2.5 MG/3ML) 0.083% nebulizer solution  Commonly known as:  PROVENTIL     albuterol 108 (90 Base) MCG/ACT inhaler  Commonly known as:  PROAIR HFA      TAKE these medications        acetaminophen 500 MG tablet  Commonly known as:  TYLENOL  Take 1,000 mg by mouth every 6 (six) hours as needed for moderate pain.     albuterol-ipratropium 18-103 MCG/ACT inhaler  Commonly known as:  COMBIVENT  Inhale 1 puff into the lungs every 4 (four) hours as needed for wheezing or shortness of breath.     ipratropium-albuterol 0.5-2.5 (3) MG/3ML Soln  Commonly known as:  DUONEB  Take 3 mLs by nebulization 3 (three) times daily as needed.     amLODipine 10 MG tablet  Commonly known as:  NORVASC  Take 1 tablet (10 mg total) by mouth daily.     budesonide 180 MCG/ACT inhaler  Commonly known as:  PULMICORT  Inhale 1 puff into the lungs 2 (two) times daily.     busPIRone 10 MG tablet  Commonly known as:  BUSPAR  Take 10 mg by mouth 2 (two) times daily.     cetirizine  10 MG tablet  Commonly known as:  ZYRTEC  Take 1 tablet (10 mg total) by mouth daily.     clonazePAM 1 MG tablet  Commonly known as:  KLONOPIN  Take 1 mg by mouth at bedtime.     DELSYM 30 MG/5ML liquid  Generic drug:  dextromethorphan  Take 60 mg by mouth as needed for cough.     docusate sodium 100 MG capsule  Commonly known as:  COLACE  Take 200 mg by mouth at bedtime.     ferrous sulfate 325 (65 FE) MG tablet  Take 1 tablet (325 mg total) by mouth 3 (three) times daily with meals.     FLUoxetine 40 MG capsule  Commonly known as:  PROZAC  Take 80 mg by mouth daily.     fluticasone 50 MCG/ACT nasal spray  Commonly known as:  FLONASE  Place 2 sprays into both nostrils daily.     glimepiride 2 MG tablet  Commonly known as:  AMARYL  Take 2 tablets (4 mg total) by mouth daily before breakfast.     guaiFENesin 600 MG 12 hr tablet  Commonly known as:  MUCINEX  Take 600 mg by mouth 2 (two) times daily as needed for cough or to loosen phlegm.     guaiFENesin-dextromethorphan 100-10 MG/5ML syrup  Commonly known as:  ROBITUSSIN DM  Take 5 mLs by mouth every 4 (four) hours as needed for cough.     HALLS COUGH DROPS MT  Use as directed 1 lozenge in the mouth or throat every 2 (two) hours as needed (for cough).     ipratropium 0.02 % nebulizer solution  Commonly known as:  ATROVENT  Take 1.25 mLs (0.25 mg total) by nebulization every 6 (six) hours as needed for wheezing or shortness of breath.     LATUDA 20 MG Tabs tablet  Generic drug:  lurasidone  Take 20 mg by mouth at bedtime.     levothyroxine 300 MCG tablet  Commonly known as:  SYNTHROID, LEVOTHROID  Take 1 tablet (300 mcg total) by mouth daily.     LORazepam 1 MG tablet  Commonly known as:  ATIVAN  Take 1 tablet (1 mg total) by mouth 3 (three) times daily as needed for anxiety. Do not fill before 30 days after last refill.     naproxen sodium 220 MG tablet  Commonly known as:  ANAPROX  Take 220 mg by mouth 2  (two) times daily as needed (for pain).     predniSONE 50 MG tablet  Commonly known as:  DELTASONE  Please continue for 1 more dose.     traZODone 100 MG tablet  Commonly known as:  DESYREL  Take 150 mg by mouth at bedtime.     Vitamin D (Ergocalciferol) 50000 units Caps capsule  Commonly known as:  DRISDOL  Take 1 capsule (50,000 Units total) by mouth every 7 (seven) days.        Discharge Instructions: Please refer to Patient Instructions section of EMR for full details.  Patient was counseled important signs and symptoms that should prompt return to medical care, changes in medications, dietary instructions, activity restrictions, and follow up appointments.   Follow-Up Appointments: Follow-up Information    Follow up with Lavon Paganini, MD. Go on 05/16/2015.   Specialty:  Family Medicine   Why:  8:45 a.m. appointment for hosptial follow-up   Contact information:   [place] [id number] [phone]       Nicolette Bang, DO 05/12/2015, 8:59 PM PGY-1, [facility]

## 2015-05-11 NOTE — Consult Note (Signed)
[facility] Consult   Reason for Consult:  Depression and suicide ideation Referring Physician:  Dr. Mingo Amber Patient Identification: [name] MRN:  [id number] Principal Diagnosis: MDD (major depressive disorder), recurrent severe, without psychosis ([place]) Diagnosis:   Patient Active Problem List   Diagnosis Date Noted  . MDD (major depressive disorder), recurrent severe, without psychosis ([place]) [F33.2] 05/11/2015  . Dyspnea [R06.00] 05/09/2015  . Asthma with exacerbation [J45.901]   . CKD (chronic kidney disease) [N18.9]   . Essential hypertension [I10]   . Vitamin D deficiency [E55.9] 04/11/2015  . Fatigue [R53.83] 04/10/2015  . Non compliance w medication regimen [Z91.14] 04/10/2015  . Golfer's elbow [M77.00] 12/22/2014  . Asthma exacerbation [J45.901] 09/22/2014  . Type 2 diabetes mellitus with complication (HCC) [Y70.9]   . Thyroid activity decreased [E03.9]   . Adjustment disorder with mixed anxiety and depressed mood [F43.23]   . Cough [R05] 09/20/2014  . Asthma with acute exacerbation [J45.901] 06/22/2014  . Grief at loss of child [F43.21] 06/22/2014  . Dysmenorrhea [N94.6] 11/25/2013  . Frequent urination [R35.0] 10/20/2012  . Furuncle [L02.92] 07/07/2012  . Suicidal ideation [R45.851] 05/27/2011  . Chronic kidney disease (CKD), stage III (moderate) [N18.3] 11/06/2010  . ENDOMETRIAL HYPERPLASIA UNSPECIFIED [N85.00] 02/07/2008  . MENORRHAGIA [N92.0] 01/03/2008  . Allergic rhinitis [J30.9] 06/24/2007  . Essential hypertension, benign [I10] 02/03/2007  . Hypothyroidism [E03.9] 05/07/2006  . Type II diabetes mellitus with complication ([place]) [K95.7] 05/07/2006  . Morbid obesity ([place]) [E66.01] 05/07/2006  . Iron deficiency anemia [D50.9] 05/07/2006  . Major depressive disorder, recurrent episode ([place]) [F33.9] 05/07/2006  . Anxiety state [F41.1] 05/07/2006  . OBSESSIVE COMPUL. DISORDER [F42.9] 05/07/2006    Total Time spent with patient: 45  minutes  Subjective:   [name] is a 45 y.o. female patient admitted with depression and suicidal ideation.  HPI:  [name] is a 45 y.o. female admitted to [facility] date shortness of breath from the family clinic. Patient is awake, alert, oriented to time place person and situation and has intact cognition to my evaluation. Patient stated she is waiting for this evaluator to see her and probably clear her for outpatient treatment. 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 Patient endorses suicidal thoughts at the time of admission but since he was treated in the hospital she has been feeling much better without having symptoms of depression, anxiety and also stated no active suicidal or homicidal ideation, intention or plans. Patient has no evidence of psychotic symptoms. Patient has family support and employment which she will return upon discharge from the hospital. She also will follow up with outpatient medication management at [facility].   Past Psychiatric History: Patient was previously admitted to psychiatric hospitalization 2012 with overdose for suicidal attempt.  Risk to Self:   Risk to Others:   Prior Inpatient Therapy:   Prior Outpatient Therapy:    Past Medical History:  Past Medical History  Diagnosis Date  . Suicide attempt by drug ingestion ([place]) 06/05/10    result rhabdomyolosis and ARF requrining dialysis   . Rhabdomyolysis 06/06/10    after drug overdose  . Acute renal failure ([place]) 05/27/10    hemodialysis for 6 weeks  . Anxiety   . Diabetes mellitus   . Depression   . Hypertension     Past Surgical History  Procedure Laterality Date  . Tubal ligation  1998  . Cholecystectomy     Family History: No family history on file. Family Psychiatric  History: Unknown  Social History:   History  Alcohol Use No     History  Drug Use No    Social History   Social History  . Marital Status: Married    Spouse Name: N/A  . Number of Children: 2  . Years of Education: N/A   Occupational History  . teachers assistant    Social History Main Topics  . Smoking status: Never Smoker   . Smokeless tobacco: Not on file  . Alcohol Use: No  . Drug Use: No  . Sexual Activity: Not Currently   Other Topics Concern  . Not on file   Social History Narrative   Married high school boyfriend at age 80, two boys, still married but he has been living with other women for years.   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 Ace Inhibitors Other (See Comments)    Patient had acute renal failure requiring hemodialysis after a suicide attempt.  Nephro recommended avoiding use.  . Haldol [Haloperidol Lactate] Other (See Comments)    Tardive dyskinesia  . Metformin And Related Other (See Comments)    Used in her suicide attempt that contributed to severe acute renal failure  . Nsaids Other (See Comments)    Nephro recommended avoiding after acute renal failure requiring hemodialysis associated with a suicide attempt.  . Latex Other (See Comments)    Labs:  Results for orders placed or performed during the hospital encounter of 05/09/15 (from the past 48 hour(s))  Comprehensive metabolic panel     Status: Abnormal   Collection Time: 05/09/15  2:32 PM  Result Value Ref Range   Sodium 140 135 - 145 mmol/L   Potassium 3.9 3.5 - 5.1 mmol/L   Chloride 108 101 - 111 mmol/L   CO2 23 22 - 32 mmol/L   Glucose, Bld 140 (H) 65 - 99 mg/dL   BUN 14 6 - 20 mg/dL   Creatinine, Ser 1.28 (H) 0.44 - 1.00 mg/dL   Calcium 8.7 (L) 8.9 - 10.3 mg/dL   Total Protein 6.4 (L) 6.5 - 8.1 g/dL   Albumin 3.0 (L) 3.5 - 5.0 g/dL   AST 17 15 - 41 U/L   ALT 12 (L) 14 - 54  U/L   Alkaline Phosphatase 34 (L) 38 - 126 U/L   Total Bilirubin 0.4 0.3 - 1.2 mg/dL   GFR calc non Af Amer 50 (L) >60 mL/min   GFR calc Af Amer 58 (L) >60 mL/min    Comment: (NOTE) The eGFR has been calculated using the CKD EPI equation. This calculation has not been validated in all clinical situations. eGFR's persistently <60 mL/min signify possible Chronic Kidney Disease.    Anion gap 9 5 - 15  CBC WITH DIFFERENTIAL     Status: Abnormal   Collection Time: 05/09/15  2:32 PM  Result Value Ref Range   WBC 9.0 4.0 - 10.5 K/uL   RBC 3.66 (L) 3.87 - 5.11 MIL/uL   Hemoglobin 8.1 (L) 12.0 - 15.0 g/dL   HCT 27.0 (L) 36.0 - 46.0 %   MCV 73.8 (L) 78.0 - 100.0 fL   MCH 22.1 (L) 26.0 -  34.0 pg   MCHC 30.0 30.0 - 36.0 g/dL   RDW 17.4 (H) 11.5 - 15.5 %   Platelets 516 (H) 150 - 400 K/uL   Neutrophils Relative % 56 %   Lymphocytes Relative 37 %   Monocytes Relative 5 %   Eosinophils Relative 2 %   Basophils Relative 0 %   Neutro Abs 5.0 1.7 - 7.7 K/uL   Lymphs Abs 3.3 0.7 - 4.0 K/uL   Monocytes Absolute 0.5 0.1 - 1.0 K/uL   Eosinophils Absolute 0.2 0.0 - 0.7 K/uL   Basophils Absolute 0.0 0.0 - 0.1 K/uL   RBC Morphology POLYCHROMASIA PRESENT     Comment: ELLIPTOCYTES TARGET CELLS   Hemoglobin A1c     Status: Abnormal   Collection Time: 05/09/15  2:32 PM  Result Value Ref Range   Hgb A1c MFr Bld 8.1 (H) 4.8 - 5.6 %    Comment: (NOTE)         Pre-diabetes: 5.7 - 6.4         Diabetes: >6.4         Glycemic control for adults with diabetes: <7.0    Mean Plasma Glucose 186 mg/dL    Comment: (NOTE) Performed At: [facility] [place], [place] [id number] Lindon Romp MD [id number]   Troponin I     Status: None   Collection Time: 05/09/15  2:32 PM  Result Value Ref Range   Troponin I <0.03 <0.031 ng/mL    Comment:        NO INDICATION OF MYOCARDIAL INJURY.   Brain natriuretic peptide     Status: None   Collection Time: 05/09/15  2:32 PM  Result  Value Ref Range   B Natriuretic Peptide 37.2 0.0 - 100.0 pg/mL  Glucose, capillary     Status: Abnormal   Collection Time: 05/09/15  5:37 PM  Result Value Ref Range   Glucose-Capillary 116 (H) 65 - 99 mg/dL  Troponin I     Status: None   Collection Time: 05/09/15  8:35 PM  Result Value Ref Range   Troponin I <0.03 <0.031 ng/mL    Comment:        NO INDICATION OF MYOCARDIAL INJURY.   Glucose, capillary     Status: Abnormal   Collection Time: 05/09/15  9:58 PM  Result Value Ref Range   Glucose-Capillary 127 (H) 65 - 99 mg/dL  Basic metabolic panel     Status: Abnormal   Collection Time: 05/10/15  2:38 AM  Result Value Ref Range   Sodium 138 135 - 145 mmol/L   Potassium 4.7 3.5 - 5.1 mmol/L    Comment: DELTA CHECK NOTED   Chloride 105 101 - 111 mmol/L   CO2 22 22 - 32 mmol/L   Glucose, Bld 194 (H) 65 - 99 mg/dL   BUN 15 6 - 20 mg/dL   Creatinine, Ser 1.26 (H) 0.44 - 1.00 mg/dL   Calcium 9.1 8.9 - 10.3 mg/dL   GFR calc non Af Amer 51 (L) >60 mL/min   GFR calc Af Amer 59 (L) >60 mL/min    Comment: (NOTE) The eGFR has been calculated using the CKD EPI equation. This calculation has not been validated in all clinical situations. eGFR's persistently <60 mL/min signify possible Chronic Kidney Disease.    Anion gap 11 5 - 15  CBC     Status: Abnormal   Collection Time: 05/10/15  2:38 AM  Result Value Ref Range   WBC 9.6  4.0 - 10.5 K/uL   RBC 3.84 (L) 3.87 - 5.11 MIL/uL   Hemoglobin 8.2 (L) 12.0 - 15.0 g/dL   HCT 28.4 (L) 36.0 - 46.0 %   MCV 74.0 (L) 78.0 - 100.0 fL   MCH 21.4 (L) 26.0 - 34.0 pg   MCHC 28.9 (L) 30.0 - 36.0 g/dL   RDW 17.3 (H) 11.5 - 15.5 %   Platelets 507 (H) 150 - 400 K/uL  Troponin I     Status: None   Collection Time: 05/10/15  2:38 AM  Result Value Ref Range   Troponin I <0.03 <0.031 ng/mL    Comment:        NO INDICATION OF MYOCARDIAL INJURY.   Glucose, capillary     Status: Abnormal   Collection Time: 05/10/15  5:35 AM  Result Value Ref Range    Glucose-Capillary 241 (H) 65 - 99 mg/dL  Glucose, capillary     Status: Abnormal   Collection Time: 05/10/15 11:01 AM  Result Value Ref Range   Glucose-Capillary 284 (H) 65 - 99 mg/dL  Glucose, capillary     Status: Abnormal   Collection Time: 05/10/15  4:59 PM  Result Value Ref Range   Glucose-Capillary 239 (H) 65 - 99 mg/dL  Glucose, capillary     Status: Abnormal   Collection Time: 05/10/15  8:48 PM  Result Value Ref Range   Glucose-Capillary 201 (H) 65 - 99 mg/dL  Glucose, capillary     Status: Abnormal   Collection Time: 05/11/15  6:01 AM  Result Value Ref Range   Glucose-Capillary 164 (H) 65 - 99 mg/dL  CBC     Status: Abnormal   Collection Time: 05/11/15  7:51 AM  Result Value Ref Range   WBC 11.9 (H) 4.0 - 10.5 K/uL   RBC 3.51 (L) 3.87 - 5.11 MIL/uL   Hemoglobin 7.5 (L) 12.0 - 15.0 g/dL   HCT 25.8 (L) 36.0 - 46.0 %   MCV 73.5 (L) 78.0 - 100.0 fL   MCH 21.4 (L) 26.0 - 34.0 pg   MCHC 29.1 (L) 30.0 - 36.0 g/dL   RDW 17.2 (H) 11.5 - 15.5 %   Platelets 496 (H) 150 - 400 K/uL  Basic metabolic panel     Status: Abnormal   Collection Time: 05/11/15  7:51 AM  Result Value Ref Range   Sodium 139 135 - 145 mmol/L   Potassium 3.8 3.5 - 5.1 mmol/L   Chloride 104 101 - 111 mmol/L   CO2 26 22 - 32 mmol/L   Glucose, Bld 149 (H) 65 - 99 mg/dL   BUN 18 6 - 20 mg/dL   Creatinine, Ser 1.37 (H) 0.44 - 1.00 mg/dL   Calcium 9.1 8.9 - 10.3 mg/dL   GFR calc non Af Amer 46 (L) >60 mL/min   GFR calc Af Amer 53 (L) >60 mL/min    Comment: (NOTE) The eGFR has been calculated using the CKD EPI equation. This calculation has not been validated in all clinical situations. eGFR's persistently <60 mL/min signify possible Chronic Kidney Disease.    Anion gap 9 5 - 15    Current Facility-Administered Medications  Medication Dose Route Frequency Provider Last Rate Last Dose  . 0.9 %  sodium chloride infusion  250 mL Intravenous PRN Elberta Leatherwood, MD      . acetaminophen (TYLENOL) tablet  1,000 mg  1,000 mg Oral Q8H PRN Elberta Leatherwood, MD   1,000 mg at 05/10/15 2042  . albuterol (PROVENTIL) (  2.5 MG/3ML) 0.083% nebulizer solution 2.5 mg  2.5 mg Nebulization Q2H PRN Elberta Leatherwood, MD   2.5 mg at 05/11/15 0603  . amLODipine (NORVASC) tablet 10 mg  10 mg Oral Daily Elberta Leatherwood, MD   10 mg at 05/10/15 1003  . busPIRone (BUSPAR) tablet 10 mg  10 mg Oral BID Elberta Leatherwood, MD   10 mg at 05/10/15 2043  . enoxaparin (LOVENOX) injection 80 mg  80 mg Subcutaneous Q24H Elberta Leatherwood, MD   80 mg at 05/10/15 1547  . ferrous sulfate tablet 325 mg  325 mg Oral TID WC Alveda Reasons, MD   325 mg at 05/10/15 1003  . FLUoxetine (PROZAC) capsule 80 mg  80 mg Oral Daily Elberta Leatherwood, MD   80 mg at 05/10/15 1251  . fluticasone (FLONASE) 50 MCG/ACT nasal spray 2 spray  2 spray Each Nare Daily Elberta Leatherwood, MD   2 spray at 05/10/15 1004  . glimepiride (AMARYL) tablet 4 mg  4 mg Oral QAC breakfast Elberta Leatherwood, MD   4 mg at 05/11/15 0174  . insulin aspart (novoLOG) injection 0-9 Units  0-9 Units Subcutaneous TID WC Elberta Leatherwood, MD   2 Units at 05/11/15 [phone]  . ipratropium-albuterol (DUONEB) 0.5-2.5 (3) MG/3ML nebulizer solution 3 mL  3 mL Nebulization Q6H Alveda Reasons, MD   3 mL at 05/11/15 0912  . levothyroxine (SYNTHROID, LEVOTHROID) tablet 300 mcg  300 mcg Oral QAC breakfast Elberta Leatherwood, MD   300 mcg at 05/11/15 6759  . loratadine (CLARITIN) tablet 10 mg  10 mg Oral Daily Elberta Leatherwood, MD   10 mg at 05/10/15 1003  . LORazepam (ATIVAN) tablet 1 mg  1 mg Oral TID PRN Elberta Leatherwood, MD   1 mg at 05/10/15 2258  . lurasidone (LATUDA) tablet 20 mg  20 mg Oral Daily Elberta Leatherwood, MD   20 mg at 05/10/15 2043  . pantoprazole (PROTONIX) EC tablet 40 mg  40 mg Oral BID WC Alveda Reasons, MD   40 mg at 05/10/15 1720  . predniSONE (DELTASONE) tablet 50 mg  50 mg Oral Daily Elberta Leatherwood, MD   50 mg at 05/10/15 1003  . senna-docusate (Senokot-S) tablet 1 tablet  1 tablet Oral QHS PRN Elberta Leatherwood, MD   1 tablet at  05/10/15 2059  . sodium chloride flush (NS) 0.9 % injection 3 mL  3 mL Intravenous Q12H Elberta Leatherwood, MD   3 mL at 05/10/15 1000  . sodium chloride flush (NS) 0.9 % injection 3 mL  3 mL Intravenous PRN Elberta Leatherwood, MD      . traZODone (DESYREL) tablet 150 mg  150 mg Oral QHS Elberta Leatherwood, MD   150 mg at 05/10/15 2042    Musculoskeletal: Strength & Muscle Tone: within normal limits Gait & Station: normal Patient leans: N/A  Psychiatric Specialty Exam: ROS denied nausea, vomiting, dumping, shortness of breath and chest pain No Fever-chills, No Headache, No changes with Vision or hearing, reports vertigo No problems swallowing food or Liquids, No Chest pain, Cough or Shortness of Breath, No Abdominal pain, No Nausea or Vommitting, Bowel movements are regular, No Blood in stool or Urine, No dysuria, No new skin rashes or bruises, No new joints pains-aches,  No new weakness, tingling, numbness in any extremity, No recent weight gain or loss, No polyuria, polydypsia or polyphagia,   A full 10 point Review  of Systems was done, except as stated above, all other Review of Systems were negative.  Blood pressure 126/97, pulse 90, temperature 98.7 F (37.1 C), temperature source Oral, resp. rate 20, height '5\' 11"'  (1.803 m), weight 164.293 kg (362 lb 3.2 oz), SpO2 97 %.Body mass index is 50.54 kg/(m^2).  General Appearance: Casual  Eye Contact::  Good  Speech:  Clear and Coherent  Volume:  Normal  Mood:  Euthymic  Affect:  Appropriate and Congruent  Thought Process:  Coherent and Goal Directed  Orientation:  Full (Time, Place, and Person)  Thought Content:  WDL  Suicidal Thoughts:  No  Homicidal Thoughts:  No  Memory:  Immediate;   Good Recent;   Good Remote;   Good  Judgement:  Good  Insight:  Good  Psychomotor Activity:  Normal  Concentration:  Good  Recall:  Good  Fund of Knowledge:Good  Language: Good  Akathisia:  Negative  Handed:  Right  AIMS (if indicated):      Assets:  Communication Skills Desire for Improvement Financial Resources/Insurance Housing Intimacy Leisure Time Resilience Social Support Talents/Skills Transportation Vocational/Educational  ADL's:  Intact  Cognition: WNL  Sleep:      Treatment Plan Summary: Patient has no safety concerns as patient contract for safety and denies active suicidal homicidal ideation  Patient has been improved depression since has been treated well with her breathing problems  Patient will continue her home medication Prozac 80 mg daily for depression, BuSpar 10 mg twice for  daily for anxiety, Latuda 20 mg daily for mood stabilization and trazodone 150 mg at bedtime for insomnia  No psychiatric medication adjusted during this hospitalization  Appreciate psychiatric consultation and we sign of as of today Please contact [phone] or [phone] if needs further assistance   Disposition: Patient will be referred to the outpatient medication management as scheduled Patient does not meet criteria for psychiatric inpatient admission. Supportive therapy provided about ongoing stressors.  Durward Parcel., MD 05/11/2015 9:18 AM

## 2015-05-11 NOTE — Progress Notes (Signed)
Patient ambulated in hallway and oxygen saturation between 97-99% on room air.  Does have some complaints of shortness of breath with ambulation.

## 2015-05-12 DIAGNOSIS — N189 Chronic kidney disease, unspecified: Secondary | ICD-10-CM

## 2015-05-12 LAB — GLUCOSE, CAPILLARY
GLUCOSE-CAPILLARY: 83 mg/dL (ref 65–99)
GLUCOSE-CAPILLARY: 85 mg/dL (ref 65–99)

## 2015-05-12 MED ORDER — IPRATROPIUM-ALBUTEROL 0.5-2.5 (3) MG/3ML IN SOLN: 3.0000 mL | Freq: Two times a day (BID) | RESPIRATORY_TRACT | Status: DC

## 2015-05-12 MED ORDER — IPRATROPIUM-ALBUTEROL 0.5-2.5 (3) MG/3ML IN SOLN
3.0000 mL | Freq: Three times a day (TID) | RESPIRATORY_TRACT | Status: DC
  Administered 2015-05-12: 3 mL via RESPIRATORY_TRACT
  Filled 2015-05-12: qty 3

## 2015-05-12 MED ORDER — IPRATROPIUM-ALBUTEROL 18-103 MCG/ACT IN AERO: 1.0000 | INHALATION_SPRAY | RESPIRATORY_TRACT | Status: DC | PRN

## 2015-05-12 MED ORDER — PREDNISONE 50 MG PO TABS: ORAL_TABLET | ORAL | Status: DC

## 2015-05-12 MED ORDER — BUDESONIDE 180 MCG/ACT IN AEPB: 1.0000 | INHALATION_SPRAY | Freq: Two times a day (BID) | RESPIRATORY_TRACT | Status: DC

## 2015-05-12 MED ORDER — IPRATROPIUM BROMIDE 0.02 % IN SOLN: 0.2500 mg | Freq: Four times a day (QID) | RESPIRATORY_TRACT | Status: DC | PRN

## 2015-05-12 MED ORDER — IPRATROPIUM-ALBUTEROL 0.5-2.5 (3) MG/3ML IN SOLN: 3.0000 mL | Freq: Three times a day (TID) | RESPIRATORY_TRACT | Status: DC | PRN

## 2015-05-12 NOTE — Progress Notes (Signed)
Family Medicine Teaching Service Daily Progress Note Intern Pager: [phone]  Patient name: [name] Medical record number: [id number] Date of birth: 03-20-1970 Age: 45 y.o. Gender: female  Primary Care Provider: Lavon Paganini, MD Consultants: Psych  Code Status: DNR   Pt Overview and Major Events to Date:  3/1: Patient admitted to [facility] for dyspnea   Assessment and Plan: [name] is a 45 y.o. female presenting with shortness of breath. Was a direct admit from clinic. PMH is significant for HTN, asthma, hypothyroidism, T2DM, and previous suicide attempts.   Dyspnea/Cough likely 2/2 asthma exacerbation, improved: Patient with diagnosis of late-onset asthma; however no documented PFTs in EMR. Potentially related to an infectious process. Well's score of 0, so low suspicion for a PE. Given new onset LE edema with SOB, some concern for CHF. Patient has never had an echocardiogram. Chest tightness/pain likely pleuritic in nature given association with inspiration/cough and reproducibility on exam. New sputum production. No fevers/chills/body aches. CXR was without evidence of consolidation or congestion.  -duonebs q6h and albuterol neb q2h prn  -O2 2L prn to keep O2 sats >92%; currently with appropriate O2 saturations on RA   -fluticasone and claritin  - Prednisone 50mg  PO x 5 days (day 3/5) to treat for probable asthma exacerbation  - EKG and troponin given report of chest tightness: troponin <0.03 x 3 and EKG normal sinus rhythm  - to evaluate for CHF: BNP 37.2, ECHO with normal EF with G1DD -able to ambulate yesterday with O2 desaturation, however given significant dyspnea with ambulation was not stable for d/c home  -pulmicort BID started as controller medication  -will try ambulation again today   Anxiety/Depression/History of Suicide Attempts: Patient endorses actively having SI. Currently taking Buspar 10 mg BID, Prozac 80 mg daily, Latuda 20 mg daily, Trazodone 150 mg  nightly, and Ativan 1 mg prn TID.  -psych consult placed: no changes to current meds; will follow up outpatient  -continue home medication regimen    HTN: Stable at admission. Taking Amlodipine 10 mg at home.  -continue amlodipine  T2DM: Last HgB A1C 7.0 in 1/17.  -continue home glimepiride  -sensitive SSI  -CBGs AC/HS  -HgB A1C worsened to 8.1> will defer to PCP   Hypothyroidism: Last TSH elevated to 7.1 in 1/17. There is documentation stating concern for noncompliance w/ medications, which could be the cause of her elevated TSH. -continue home levothyroxine 300 mcg   Anemia: Patient with h/o of chronic microcytic iron deficiency anemia. Reports that she recently started taking Fe supplements again a few weeks ago. Last HgB from CBC in 04/10/15 was 8.2.  -monitor HgB on CBC  -continue Fe supplement  -continue sennakot-s for Fe induced constipation   CKD III: Cr 1.26, which is consistent with patient's baseline kidney function.  - Avoid nephrotoxic medications.  - Monitor Cr  -patient also with low albumin which could be contributing to her edema   FEN/GI: Heart Healthy/Carb Modified Diet, SLIV  Prophylaxis: Lovenox   Disposition: discharge today  Subjective:  Wants to go home but states this morning was very difficult this morning in terms of cough/wheeze/SOB.   Objective: Temp:  [98.7 F (37.1 C)-99.3 F (37.4 C)] 98.7 F (37.1 C) (03/04 0614) Pulse Rate:  [80-98] 80 (03/04 0614) Resp:  [18-20] 18 (03/04 0614) BP: (126-147)/(67-97) 147/92 mmHg (03/04 0614) SpO2:  [96 %-99 %] 99 % (03/04 0614) Weight:  [359 lb 9.6 oz (163.113 kg)] 359 lb 9.6 oz (163.113 kg) (03/04 [id number])  Physical Exam: General: obese female sitting up in bed in NAD Cardiovascular: RRR. No murmurs appreciated.  Respiratory: on room air, normal effort, minimal wheezing appreciated bilaterally. Able to speak in full sentences.  Abdomen: Obese, soft, NT  Extremities: no LE  edema  Laboratory:  Recent Labs Lab 05/09/15 1432 05/10/15 0238 05/11/15 0751  WBC 9.0 9.6 11.9*  HGB 8.1* 8.2* 7.5*  HCT 27.0* 28.4* 25.8*  PLT 516* 507* 496*    Recent Labs Lab 05/09/15 1432 05/10/15 0238 05/11/15 0751  NA 140 138 139  K 3.9 4.7 3.8  CL 108 105 104  CO2 23 22 26   BUN 14 15 18   CREATININE 1.28* 1.26* 1.37*  CALCIUM 8.7* 9.1 9.1  PROT 6.4*  --   --   BILITOT 0.4  --   --   ALKPHOS 34*  --   --   ALT 12*  --   --   AST 17  --   --   GLUCOSE 140* 194* 149*   BNP: 37.2 HgB A1C: 8.1  Imaging/Diagnostic Tests: ECHO: EF 50-55%, G1DD  X-ray Chest Pa And Lateral  05/09/2015  CLINICAL DATA:  One-day history of dyspnea. EXAM: CHEST  2 VIEW COMPARISON:  09/22/2014. FINDINGS: The lungs are clear wiithout focal pneumonia, edema, pneumothorax or pleural effusion. Cardiopericardial silhouette is at upper limits of normal for size. The visualized bony structures of the thorax are intact. Telemetry leads overlie the chest. IMPRESSION: No active cardiopulmonary disease. Electronically Signed   By: Misty Stanley M.D.   On: 05/09/2015 15:27   EKG: Normal sinus rhythm   Nicolette Bang, DO 05/12/2015, 7:32 AM PGY-1, [place] Intern pager: [phone], text pages welcome

## 2015-05-12 NOTE — Discharge Instructions (Signed)
You were hospitalized for an asthma exacerbation. Take your last dose of prednisone on 3/5 to complete a 5 day course. You were also started on a new medication called Pulmicort. You should use Pulmicort daily regardless of what symptoms you are having. This medication helps to prevent a severe asthma exacerbation. Use the combivent inhaler first for wheezing as needed. Other nebulizer medications were prescribed in case of exacerbation. Please go to your follow up appointment at the [facility].   Future Appointments Date Time Provider [place]  05/16/2015 8:45 AM Virginia Crews, MD [facility]

## 2015-05-16 ENCOUNTER — Ambulatory Visit: Payer: Self-pay | Admitting: Family Medicine

## 2015-05-17 NOTE — Progress Notes (Signed)
RETRO REVIEW COMPLETED.

## 2015-08-02 ENCOUNTER — Other Ambulatory Visit: Payer: Self-pay | Admitting: Orthopaedic Surgery

## 2015-08-15 NOTE — H&P (Signed)
[name] is an 45 y.o. female.   Chief Complaint: Right ankle pain HPI: [name] says the third ankle injection afforded her a day or 2 of relief.  Unfortunately she continues with anterior aspect pain which gets worse when she walks.  She has trouble bearing weight.  She has trouble walking anywhere.  She continues to do her regular job with some difficulty.  She also uses a topical and the brace that we have given her.  She takes Mobic daily with food and has some orthotics which we made a while back.    MRI:  I reviewed an MRI scan films and report of a study done at [facility] on 10/24/14 of the right ankle demonstrates a mild to moderate posterior subtalar joint arthrosis and a small effusion.  There is also some mild tibiotalar joint arthrosis.  There is some mild thickening/scarring of the ATFL ligament and the anteroinferior tibiofibular ligament which can be associated with anterolateral impingement.  There is also some mild posterior tibial tendinosis and tenosynovitis noted.  Past Medical History  Diagnosis Date  . Suicide attempt by drug ingestion ([place]) 06/05/10    result rhabdomyolosis and ARF requrining dialysis   . Rhabdomyolysis 06/06/10    after drug overdose  . Acute renal failure ([place]) 05/27/10    hemodialysis for 6 weeks  . Anxiety   . Diabetes mellitus   . Depression   . Hypertension     Past Surgical History  Procedure Laterality Date  . Tubal ligation  1998  . Cholecystectomy      No family history on file. Social History:  reports that she has never smoked. She does not have any smokeless tobacco history on file. She reports that she does not drink alcohol or use illicit drugs.  Allergies:  Allergies  Allergen Reactions  . Ace Inhibitors Other (See Comments)    Patient had acute renal failure requiring hemodialysis after a suicide attempt.  Nephro recommended avoiding use.  . Haldol [Haloperidol Lactate] Other (See Comments)    Tardive dyskinesia  .  Metformin And Related Other (See Comments)    Used in her suicide attempt that contributed to severe acute renal failure  . Nsaids Other (See Comments)    Nephro recommended avoiding after acute renal failure requiring hemodialysis associated with a suicide attempt.  . Latex Other (See Comments)    No prescriptions prior to admission    No results found for this or any previous visit (from the past 48 hour(s)). No results found.  Review of Systems  Musculoskeletal: Positive for joint pain.       Right ankle  All other systems reviewed and are negative.   There were no vitals taken for this visit. Physical Exam  Constitutional: She is oriented to person, place, and time. She appears well-developed and well-nourished.  HENT:  Head: Normocephalic.  Eyes: Pupils are equal, round, and reactive to light.  Neck: Normal range of motion.  Respiratory: Effort normal.  GI: Soft.  Musculoskeletal:  Right ankle motion remains a little bit limited.  She has 5 of dorsiflexion which is fairly painful and about 20 of plantarflexion which is not painful.  She may have a trace effusion.  Her pain is anterolateral and to a lesser degree anteromedial.  I do not get much subtalar pain.  Sensation is intact in the foot and she has a palpable pulse.  She has a fairly flat arch.    Neurological: She is alert and oriented  to person, place, and time.  Skin: Skin is warm and dry.  Psychiatric: She has a normal mood and affect. Her behavior is normal. Judgment and thought content normal.     Assessment/Plan Assessment:  Right ankle impingement with distal fibula fracture 2015 and MRI 2016 injected x3  Plan: [name] has struggled for a very long time with this ankle.  She persists with difficulty despite physical therapy, bracing, orthotics, 3 injections, and oral and topical anti-inflammatories.  She has some things consistent with impingement on an MRI scan which was done last year.  I reviewed risk of  anesthesia, infection, DVT related to an ankle arthroscopy.  She would have to keep her foot elevated for about a week afterwards to minimize her chance of complication.  She might require some therapy after the surgery.    Jaleil Renwick, Larwance Sachs, PA-C 08/15/2015, 8:24 AM

## 2015-08-16 NOTE — Progress Notes (Signed)
Unable to reach pt by phone for pre-op call. Left instructions according to the Pre-op Call checklist. Also instructed her to check her blood sugar every 2 hours in the AM prior to leaving for the hospital. If blood sugar is 70 or below, treat with 1/2 cup of clear juice (apple or cranberry) and recheck blood sugar 15 minutes after drinking juice. If blood sugar continues to be 70 or below, call the Short Stay department and ask to speak to a nurse. Instructed her to arrive at 12:15 PM.

## 2015-08-17 ENCOUNTER — Ambulatory Visit (HOSPITAL_COMMUNITY): Payer: Worker's Compensation | Admitting: Anesthesiology

## 2015-08-17 ENCOUNTER — Encounter (HOSPITAL_COMMUNITY)
Admission: RE | Disposition: A | Discharge status: Home / Self Care | Payer: Self-pay | Source: Ambulatory Visit | Attending: Orthopaedic Surgery

## 2015-08-17 ENCOUNTER — Encounter (HOSPITAL_COMMUNITY): Payer: Self-pay | Admitting: Surgery

## 2015-08-17 ENCOUNTER — Ambulatory Visit (HOSPITAL_BASED_OUTPATIENT_CLINIC_OR_DEPARTMENT_OTHER)
Admission: RE | Admit: 2015-08-17 | Discharge: 2015-08-17 | Disposition: A | Discharge status: Home / Self Care | Payer: Worker's Compensation | Source: Ambulatory Visit | Attending: Orthopaedic Surgery | Admitting: Orthopaedic Surgery

## 2015-08-17 DIAGNOSIS — J45909 Unspecified asthma, uncomplicated: Secondary | ICD-10-CM | POA: Insufficient documentation

## 2015-08-17 DIAGNOSIS — D649 Anemia, unspecified: Secondary | ICD-10-CM | POA: Diagnosis not present

## 2015-08-17 DIAGNOSIS — Z6841 Body Mass Index (BMI) 40.0 and over, adult: Secondary | ICD-10-CM | POA: Insufficient documentation

## 2015-08-17 DIAGNOSIS — Z7982 Long term (current) use of aspirin: Secondary | ICD-10-CM | POA: Diagnosis not present

## 2015-08-17 DIAGNOSIS — I10 Essential (primary) hypertension: Secondary | ICD-10-CM | POA: Insufficient documentation

## 2015-08-17 DIAGNOSIS — Z7951 Long term (current) use of inhaled steroids: Secondary | ICD-10-CM | POA: Diagnosis not present

## 2015-08-17 DIAGNOSIS — E039 Hypothyroidism, unspecified: Secondary | ICD-10-CM | POA: Insufficient documentation

## 2015-08-17 DIAGNOSIS — M25871 Other specified joint disorders, right ankle and foot: Secondary | ICD-10-CM | POA: Diagnosis not present

## 2015-08-17 DIAGNOSIS — E119 Type 2 diabetes mellitus without complications: Secondary | ICD-10-CM | POA: Diagnosis not present

## 2015-08-17 DIAGNOSIS — F329 Major depressive disorder, single episode, unspecified: Secondary | ICD-10-CM | POA: Diagnosis not present

## 2015-08-17 DIAGNOSIS — Z79899 Other long term (current) drug therapy: Secondary | ICD-10-CM | POA: Diagnosis not present

## 2015-08-17 DIAGNOSIS — F419 Anxiety disorder, unspecified: Secondary | ICD-10-CM | POA: Diagnosis not present

## 2015-08-17 DIAGNOSIS — Z7984 Long term (current) use of oral hypoglycemic drugs: Secondary | ICD-10-CM | POA: Insufficient documentation

## 2015-08-17 HISTORY — PX: ANKLE ARTHROSCOPY: SHX545

## 2015-08-17 LAB — BASIC METABOLIC PANEL
Anion gap: 5 (ref 5–15)
BUN: 15 mg/dL (ref 6–20)
CHLORIDE: 106 mmol/L (ref 101–111)
CO2: 28 mmol/L (ref 22–32)
CREATININE: 1.45 mg/dL — AB (ref 0.44–1.00)
Calcium: 9 mg/dL (ref 8.9–10.3)
GFR calc Af Amer: 49 mL/min — ABNORMAL LOW (ref >60)
GFR calc non Af Amer: 42 mL/min — ABNORMAL LOW (ref >60)
GLUCOSE: 103 mg/dL — AB (ref 65–99)
Potassium: 4.4 mmol/L (ref 3.5–5.1)
SODIUM: 139 mmol/L (ref 135–145)

## 2015-08-17 LAB — CBC
HCT: 29.4 % — ABNORMAL LOW (ref 36.0–46.0)
Hemoglobin: 8.5 g/dL — ABNORMAL LOW (ref 12.0–15.0)
MCH: 21.6 pg — AB (ref 26.0–34.0)
MCHC: 28.9 g/dL — AB (ref 30.0–36.0)
MCV: 74.8 fL — AB (ref 78.0–100.0)
PLATELETS: 477 10*3/uL — AB (ref 150–400)
RBC: 3.93 MIL/uL (ref 3.87–5.11)
RDW: 16.8 % — AB (ref 11.5–15.5)
WBC: 6 10*3/uL (ref 4.0–10.5)

## 2015-08-17 LAB — HCG, SERUM, QUALITATIVE: Preg, Serum: NEGATIVE

## 2015-08-17 LAB — GLUCOSE, CAPILLARY
Glucose-Capillary: 95 mg/dL (ref 65–99)
Glucose-Capillary: 152 mg/dL — ABNORMAL HIGH (ref 65–99)

## 2015-08-17 SURGERY — ARTHROSCOPY, ANKLE
Anesthesia: General | Site: Ankle | Laterality: Right

## 2015-08-17 SURGERY — ARTHROSCOPY, ANKLE
Anesthesia: General | Laterality: Right

## 2015-08-17 MED ORDER — ONDANSETRON HCL 4 MG/2ML IJ SOLN
4.0000 mg | Freq: Once | INTRAMUSCULAR | Status: AC
  Administered 2015-08-17: 4 mg via INTRAVENOUS

## 2015-08-17 MED ORDER — SODIUM CHLORIDE 0.9% FLUSH
INTRAVENOUS | Status: DC | PRN
  Administered 2015-08-17: 10 mL

## 2015-08-17 MED ORDER — MORPHINE SULFATE (PF) 4 MG/ML IV SOLN
INTRAVENOUS | Status: AC
  Filled 2015-08-17: qty 1

## 2015-08-17 MED ORDER — BUPIVACAINE-EPINEPHRINE (PF) 0.5% -1:200000 IJ SOLN
INTRAMUSCULAR | Status: AC
  Filled 2015-08-17: qty 30

## 2015-08-17 MED ORDER — METHYLPREDNISOLONE ACETATE 40 MG/ML IJ SUSP
INTRAMUSCULAR | Status: DC | PRN
  Administered 2015-08-17: 40 mg via INTRA_ARTICULAR

## 2015-08-17 MED ORDER — FENTANYL CITRATE (PF) 250 MCG/5ML IJ SOLN
INTRAMUSCULAR | Status: AC
  Filled 2015-08-17: qty 5

## 2015-08-17 MED ORDER — METHYLPREDNISOLONE ACETATE 40 MG/ML IJ SUSP
INTRAMUSCULAR | Status: AC
  Filled 2015-08-17: qty 1

## 2015-08-17 MED ORDER — DEXTROSE 5 % IV SOLN
3.0000 g | INTRAVENOUS | Status: AC
  Administered 2015-08-17: 3 g via INTRAVENOUS
  Filled 2015-08-17: qty 3000

## 2015-08-17 MED ORDER — LIDOCAINE HCL (CARDIAC) 20 MG/ML IV SOLN
INTRAVENOUS | Status: DC | PRN
  Administered 2015-08-17: 100 mg via INTRAVENOUS

## 2015-08-17 MED ORDER — PROPOFOL 10 MG/ML IV BOLUS
INTRAVENOUS | Status: AC
  Filled 2015-08-17: qty 40

## 2015-08-17 MED ORDER — ONDANSETRON HCL 4 MG/2ML IJ SOLN
INTRAMUSCULAR | Status: DC | PRN
  Administered 2015-08-17: 4 mg via INTRAVENOUS

## 2015-08-17 MED ORDER — LACTATED RINGERS IV SOLN
INTRAVENOUS | Status: DC | PRN
  Administered 2015-08-17: 14:00:00 via INTRAVENOUS

## 2015-08-17 MED ORDER — HYDROMORPHONE HCL 1 MG/ML IJ SOLN: 0.2500 mg | INTRAMUSCULAR | Status: DC | PRN

## 2015-08-17 MED ORDER — MIDAZOLAM HCL 2 MG/2ML IJ SOLN
INTRAMUSCULAR | Status: AC
  Filled 2015-08-17: qty 2

## 2015-08-17 MED ORDER — MORPHINE SULFATE (PF) 4 MG/ML IV SOLN
INTRAVENOUS | Status: DC | PRN
  Administered 2015-08-17: 4 mg via INTRAVENOUS

## 2015-08-17 MED ORDER — ONDANSETRON HCL 4 MG/2ML IJ SOLN
INTRAMUSCULAR | Status: AC
  Administered 2015-08-17: 4 mg via INTRAVENOUS
  Filled 2015-08-17: qty 2

## 2015-08-17 MED ORDER — LIDOCAINE 2% (20 MG/ML) 5 ML SYRINGE
INTRAMUSCULAR | Status: AC
  Filled 2015-08-17: qty 5

## 2015-08-17 MED ORDER — SODIUM CHLORIDE 0.9 % IR SOLN
Status: DC | PRN
Start: 2015-08-17 — End: 2015-08-17
  Administered 2015-08-17: 3000 mL

## 2015-08-17 MED ORDER — HYDROCODONE-ACETAMINOPHEN 5-325 MG PO TABS: 1.0000 | ORAL_TABLET | Freq: Four times a day (QID) | ORAL | Status: DC | PRN

## 2015-08-17 MED ORDER — LACTATED RINGERS IV SOLN
INTRAVENOUS | Status: DC
  Administered 2015-08-17: 13:00:00 via INTRAVENOUS

## 2015-08-17 MED ORDER — HYDROCODONE-ACETAMINOPHEN 5-325 MG PO TABS
ORAL_TABLET | ORAL | Status: AC
  Administered 2015-08-17: 1
  Filled 2015-08-17: qty 1

## 2015-08-17 MED ORDER — BUPIVACAINE-EPINEPHRINE (PF) 0.5% -1:200000 IJ SOLN
INTRAMUSCULAR | Status: DC | PRN
  Administered 2015-08-17: 10 mL

## 2015-08-17 MED ORDER — FENTANYL CITRATE (PF) 100 MCG/2ML IJ SOLN
INTRAMUSCULAR | Status: DC | PRN
Start: 2015-08-17 — End: 2015-08-17
  Administered 2015-08-17: 100 ug via INTRAVENOUS

## 2015-08-17 MED ORDER — CHLORHEXIDINE GLUCONATE 4 % EX LIQD: 60.0000 mL | Freq: Once | CUTANEOUS | Status: DC

## 2015-08-17 MED ORDER — SUCCINYLCHOLINE CHLORIDE 20 MG/ML IJ SOLN
INTRAMUSCULAR | Status: DC | PRN
  Administered 2015-08-17: 200 mg via INTRAVENOUS

## 2015-08-17 MED ORDER — PROPOFOL 10 MG/ML IV BOLUS
INTRAVENOUS | Status: DC | PRN
  Administered 2015-08-17: 200 mg via INTRAVENOUS

## 2015-08-17 MED ORDER — MIDAZOLAM HCL 5 MG/5ML IJ SOLN
INTRAMUSCULAR | Status: DC | PRN
  Administered 2015-08-17: 2 mg via INTRAVENOUS

## 2015-08-17 SURGICAL SUPPLY — 41 items
BANDAGE ACE 4X5 VEL STRL LF (GAUZE/BANDAGES/DRESSINGS) ×2 IMPLANT
BANDAGE ELASTIC 4 VELCRO ST LF (GAUZE/BANDAGES/DRESSINGS) ×3 IMPLANT
BLADE CUTTER GATOR 3.5 (BLADE) ×2 IMPLANT
BLADE SURG 15 STRL LF DISP TIS (BLADE) ×1 IMPLANT
BLADE SURG 15 STRL SS (BLADE) ×3
BNDG GAUZE ELAST 4 BULKY (GAUZE/BANDAGES/DRESSINGS) ×2 IMPLANT
BUR CUDA 2.9 (BURR) IMPLANT
BUR CUDA 2.9MM (BURR)
BUR GATOR 2.9 (BURR) IMPLANT
BUR GATOR 2.9MM (BURR)
DECANTER SPIKE VIAL GLASS SM (MISCELLANEOUS) ×2 IMPLANT
DRAPE ARTHROSCOPY W/POUCH 114 (DRAPES) ×3 IMPLANT
DRSG EMULSION OIL 3X3 NADH (GAUZE/BANDAGES/DRESSINGS) ×3 IMPLANT
DURAPREP 26ML APPLICATOR (WOUND CARE) ×3 IMPLANT
GAUZE SPONGE 4X4 12PLY STRL (GAUZE/BANDAGES/DRESSINGS) ×3 IMPLANT
GAUZE SPONGE 4X4 16PLY XRAY LF (GAUZE/BANDAGES/DRESSINGS) IMPLANT
GLOVE BIOGEL PI IND STRL 8 (GLOVE) ×1 IMPLANT
GLOVE BIOGEL PI INDICATOR 8 (GLOVE) ×2
GLOVE SURG SS PI 6.5 STRL IVOR (GLOVE) ×12 IMPLANT
GLOVE SURG SS PI 7.5 STRL IVOR (GLOVE) ×4 IMPLANT
GOWN STRL REUS W/ TWL LRG LVL3 (GOWN DISPOSABLE) ×1 IMPLANT
GOWN STRL REUS W/ TWL XL LVL3 (GOWN DISPOSABLE) ×3 IMPLANT
GOWN STRL REUS W/TWL 2XL LVL3 (GOWN DISPOSABLE) ×3 IMPLANT
GOWN STRL REUS W/TWL LRG LVL3 (GOWN DISPOSABLE) ×3
GOWN STRL REUS W/TWL XL LVL3 (GOWN DISPOSABLE) ×9
HOVERMATT SINGLE USE (MISCELLANEOUS) ×2 IMPLANT
KIT BASIN OR (CUSTOM PROCEDURE TRAY) ×3 IMPLANT
KIT ROOM TURNOVER OR (KITS) ×3 IMPLANT
PACK ARTHROSCOPY DSU (CUSTOM PROCEDURE TRAY) ×3 IMPLANT
PAD ABD 8X10 STRL (GAUZE/BANDAGES/DRESSINGS) ×2 IMPLANT
PAD ARMBOARD 7.5X6 YLW CONV (MISCELLANEOUS) ×6 IMPLANT
SET ARTHROSCOPY TUBING (MISCELLANEOUS) ×3
SET ARTHROSCOPY TUBING LN (MISCELLANEOUS) ×1 IMPLANT
SPONGE GAUZE 4X4 12PLY STER LF (GAUZE/BANDAGES/DRESSINGS) ×2 IMPLANT
SPONGE LAP 4X18 X RAY DECT (DISPOSABLE) ×2 IMPLANT
STRAP ANKLE FOOT DISTRACTOR (ORTHOPEDIC SUPPLIES) ×2 IMPLANT
SUT ETHILON 4 0 PS 2 18 (SUTURE) IMPLANT
SYR CONTROL 10ML LL (SYRINGE) ×2 IMPLANT
TOWEL OR 17X24 6PK STRL BLUE (TOWEL DISPOSABLE) ×3 IMPLANT
TOWEL OR 17X26 10 PK STRL BLUE (TOWEL DISPOSABLE) ×3 IMPLANT
WATER STERILE IRR 1000ML POUR (IV SOLUTION) ×3 IMPLANT

## 2015-08-17 NOTE — Anesthesia Postprocedure Evaluation (Signed)
Anesthesia Post Note  Patient: [name]  Procedure(s) Performed: Procedure(s) (LRB): RIGHT ANKLE ARTHROSCOPY WITH SYNOVECTOMY AND LOOSE BODY EXCISION (Right)  Patient location during evaluation: PACU Anesthesia Type: General Level of consciousness: awake, awake and alert and oriented Pain management: pain level controlled Vital Signs Assessment: post-procedure vital signs reviewed and stable Respiratory status: spontaneous breathing, nonlabored ventilation and respiratory function stable Cardiovascular status: blood pressure returned to baseline Anesthetic complications: no    Last Vitals:  Filed Vitals:   08/17/15 1636 08/17/15 1639  BP:  113/70  Pulse: 95 94  Temp: 36.3 C   Resp: 21 21    Last Pain:  Filed Vitals:   08/17/15 1641  PainSc: 3         RLE Motor Response: Purposeful movement;Responds to commands (08/17/15 1639) RLE Sensation: Full sensation;No numbness;No tingling (08/17/15 1639)      Lysander Calixte COKER

## 2015-08-17 NOTE — Brief Op Note (Signed)
    [name] [id number] 08/17/2015   PRE-OP DIAGNOSIS: right ankle impingement  POST-OP DIAGNOSIS: same  PROCEDURE: right ankle scope with LB removal and synovectomy  ANESTHESIA: general  Taejon Irani G   Dictation #:  ?

## 2015-08-17 NOTE — Interval H&P Note (Signed)
History and Physical Interval Note:  08/17/2015 2:23 PM  [name]  has presented today for surgery, with the diagnosis of RIGHT ANKLE IMPINGEMENT  The various methods of treatment have been discussed with the patient and family. After consideration of risks, benefits and other options for treatment, the patient has consented to  Procedure(s): RIGHT ANKLE ARTHROSCOPY (Right) as a surgical intervention .  The patient's history has been reviewed, patient examined, no change in status, stable for surgery.  I have reviewed the patient's chart and labs.  Questions were answered to the patient's satisfaction.     Karrah Mangini G

## 2015-08-17 NOTE — Anesthesia Procedure Notes (Signed)
Procedure Name: Intubation Date/Time: 08/17/2015 3:06 PM Performed by: Manus Gunning, Lashaundra Lehrmann J Pre-anesthesia Checklist: Patient identified, Timeout performed, Emergency Drugs available, Suction available and Patient being monitored Patient Re-evaluated:Patient Re-evaluated prior to inductionOxygen Delivery Method: Circle system utilized Preoxygenation: Pre-oxygenation with 100% oxygen Intubation Type: IV induction Ventilation: Mask ventilation without difficulty Laryngoscope Size: Mac and 4 Grade View: Grade I Tube type: Oral Tube size: 7.5 mm Number of attempts: 1 Placement Confirmation: ETT inserted through vocal cords under direct vision,  breath sounds checked- equal and bilateral and positive ETCO2 Secured at: 21 cm Tube secured with: Tape Dental Injury: Teeth and Oropharynx as per pre-operative assessment

## 2015-08-17 NOTE — Progress Notes (Signed)
Orthopedic Tech Progress Note Patient Details:  [name] 08-23-1970 [id number]  Ortho Devices Type of Ortho Device: Postop shoe/boot Ortho Device/Splint Interventions: Application   [name] 08/17/2015, 4:23 PM

## 2015-08-17 NOTE — Anesthesia Preprocedure Evaluation (Addendum)
Anesthesia Evaluation  Patient identified by MRN, date of birth, ID band Patient awake    Reviewed: Allergy & Precautions, NPO status , Patient's Chart, lab work & pertinent test results  Airway Mallampati: III  TM Distance: >3 FB Neck ROM: Full    Dental  (+) Dental Advisory Given, Teeth Intact   Pulmonary asthma ,    breath sounds clear to auscultation       Cardiovascular hypertension, Pt. on medications  Rhythm:Regular Rate:Normal     Neuro/Psych Anxiety Depression    GI/Hepatic negative GI ROS, Neg liver ROS,   Endo/Other  diabetes, Type 2Hypothyroidism Morbid obesity  Renal/GU Renal disease     Musculoskeletal  (+) Arthritis ,   Abdominal   Peds  Hematology  (+) anemia ,   Anesthesia Other Findings   Reproductive/Obstetrics                            Lab Results  Component Value Date   WBC 6.0 08/17/2015   HGB 8.5* 08/17/2015   HCT 29.4* 08/17/2015   MCV 74.8* 08/17/2015   PLT 477* 08/17/2015   Lab Results  Component Value Date   CREATININE 1.45* 08/17/2015   BUN 15 08/17/2015   NA 139 08/17/2015   K 4.4 08/17/2015   CL 106 08/17/2015   CO2 28 08/17/2015    Anesthesia Physical Anesthesia Plan  ASA: III  Anesthesia Plan: General   Post-op Pain Management:    Induction: Intravenous  Airway Management Planned: Oral ETT  Additional Equipment:   Intra-op Plan:   Post-operative Plan: Extubation in OR  Informed Consent: I have reviewed the patients History and Physical, chart, labs and discussed the procedure including the risks, benefits and alternatives for the proposed anesthesia with the patient or authorized representative who has indicated his/her understanding and acceptance.   Dental advisory given  Plan Discussed with: CRNA  Anesthesia Plan Comments:        Anesthesia Quick Evaluation

## 2015-08-17 NOTE — Transfer of Care (Signed)
Immediate Anesthesia Transfer of Care Note  Patient: [name]  Procedure(s) Performed: Procedure(s): RIGHT ANKLE ARTHROSCOPY WITH SYNOVECTOMY AND LOOSE BODY EXCISION (Right)  Patient Location: PACU  Anesthesia Type:General  Level of Consciousness: awake  Airway & Oxygen Therapy: Patient Spontanous Breathing and Patient connected to face mask oxygen  Post-op Assessment: Report given to RN and Post -op Vital signs reviewed and stable  Post vital signs: Reviewed and stable  Last Vitals:  Filed Vitals:   08/17/15 1251 08/17/15 1600  BP: 150/83 162/118  Pulse: 74 132  Temp: 37.1 C 36.7 C  Resp: 18 15    Last Pain:  Filed Vitals:   08/17/15 1604  PainSc: 7       Patients Stated Pain Goal: 4 ([id number])  Complications: No apparent anesthesia complications

## 2015-08-18 NOTE — Op Note (Signed)
[name], [name]               ACCOUNT NO.:  [id number]  MEDICAL RECORD NO.:  [id number]  LOCATION:  [facility]                         FACILITY:  [facility]  PHYSICIAN:  Monico Blitz. Darika Ildefonso, M.D.DATE OF BIRTH:  Nov 25, 1970  DATE OF PROCEDURE:  08/17/2015 DATE OF DISCHARGE:  08/17/2015                              OPERATIVE REPORT   PREOPERATIVE DIAGNOSIS:  Right ankle impingement.  POSTOPERATIVE DIAGNOSIS:  Right ankle impingement.  PROCEDURE:  Right ankle arthroscopy.  ANESTHESIA:  General.  ATTENDING SURGEON:  Monico Blitz. Rhona Raider, M.D.  ASSISTANT:  Loni Dolly, PA.  INDICATION FOR PROCEDURE:  The patient is a 45 year old woman with about a year of ankle pain after work-related injury.  This is persisted despite bracing and therapy and an injection and obviously a long amount of time.  By an MRI scan, she has some things consistent with impingement.  She has pain which limits her ability to walk without discomfort in shower.  She is offered an arthroscopy.  Informed operative consent was obtained after discussion of possible complications including reaction to anesthesia, infection, and neurovascular injury.  SUMMARY OF FINDINGS AND PROCEDURE:  Under general anesthesia, a right ankle arthroscopy was performed.  She did have soft tissue issue impingement both medial and lateral and a small loose body which was about a cm in size and bony in nature.  This was removed.  A thorough synovectomy was done.  She had no significant degenerative change.  She was injected with the usual agents plus some Depo-Medrol and discharged home the same day.  DESCRIPTION OF PROCEDURE:  The patient was taken to the operating suite, where general anesthetic was applied without difficulty.  She was positioned supine and prepped and draped in normal sterile fashion. After the administration of preop IV Kefzol and an appropriate time-out, a distraction device was placed on the ankle and some light  traction applied.  We injected the ankle with 10 mL of saline followed by creation of a medial portal just medial to the anterior tibialis tendon and introduced the scope bluntly.  I then made an instrument portal on the lateral aspect, first with a spinal needle and then with a skin incision, followed by blunt dissection down through the capsule.  She had findings as noted above and a shaver was placed in the knee.  We did perform a synovectomy.  I then used a grabber to remove the small loose body which was bony in nature.  She had just some mild degenerative change at most and no bare bone was exposed.  The ankle was thoroughly irrigated, followed by placement of some Marcaine with epinephrine and Depo-Medrol 40 mg.  I reapproximated the portals loosely with nylon followed by Adaptic, dry gauze, and a loose Coban wrap.  ESTIMATED BLOOD LOSS AND FLUIDS:  Can be obtained from anesthesia records.  No tourniquet was placed.  DISPOSITION:  The patient was extubated in operating room and taken to recovery in stable condition.  Plans were for to go home same-day and follow up in the office closely.  I will contact her by phone tonight.     Monico Blitz Rhona Raider, M.D.     PGD/MEDQ  D:  08/17/2015  T:  08/18/2015  Job:  [id number]

## 2015-08-20 ENCOUNTER — Encounter (HOSPITAL_COMMUNITY): Payer: Self-pay | Admitting: Orthopaedic Surgery

## 2015-08-28 ENCOUNTER — Ambulatory Visit (INDEPENDENT_AMBULATORY_CARE_PROVIDER_SITE_OTHER): Payer: BC Managed Care – PPO | Admitting: Family Medicine

## 2015-08-28 ENCOUNTER — Encounter: Payer: Self-pay | Admitting: Family Medicine

## 2015-08-28 VITALS — BP 139/96 | HR 83 | Temp 98.9°F | Ht 71.0 in | Wt 366.0 lb

## 2015-08-28 DIAGNOSIS — D509 Iron deficiency anemia, unspecified: Secondary | ICD-10-CM

## 2015-08-28 DIAGNOSIS — R5383 Other fatigue: Secondary | ICD-10-CM

## 2015-08-28 DIAGNOSIS — Z9114 Patient's other noncompliance with medication regimen: Secondary | ICD-10-CM

## 2015-08-28 DIAGNOSIS — N183 Chronic kidney disease, stage 3 unspecified: Secondary | ICD-10-CM

## 2015-08-28 DIAGNOSIS — I10 Essential (primary) hypertension: Secondary | ICD-10-CM

## 2015-08-28 DIAGNOSIS — F411 Generalized anxiety disorder: Secondary | ICD-10-CM

## 2015-08-28 DIAGNOSIS — E039 Hypothyroidism, unspecified: Secondary | ICD-10-CM

## 2015-08-28 DIAGNOSIS — E118 Type 2 diabetes mellitus with unspecified complications: Secondary | ICD-10-CM | POA: Diagnosis not present

## 2015-08-28 DIAGNOSIS — E559 Vitamin D deficiency, unspecified: Secondary | ICD-10-CM

## 2015-08-28 LAB — IRON AND TIBC
%SAT: 5 % — ABNORMAL LOW (ref 11–50)
IRON: 18 ug/dL — AB (ref 40–190)
TIBC: 380 ug/dL (ref 250–450)
UIBC: 362 ug/dL (ref 125–400)

## 2015-08-28 LAB — TSH: TSH: 3.39 m[IU]/L

## 2015-08-28 LAB — POCT GLYCOSYLATED HEMOGLOBIN (HGB A1C): HEMOGLOBIN A1C: 7.1

## 2015-08-28 MED ORDER — LORAZEPAM 1 MG PO TABS: 1.0000 mg | ORAL_TABLET | Freq: Three times a day (TID) | ORAL | Status: DC | PRN

## 2015-08-28 NOTE — Assessment & Plan Note (Signed)
Seems to be well controlled currently Not tearful during visit today which is a change from baseline Ativan refilled for 3 months

## 2015-08-28 NOTE — Assessment & Plan Note (Signed)
Not at goal today Was well-controlled while hospitalized within recent months Likely related to not taking medications daily Recent BMP reviewed Continue current medications at this time

## 2015-08-28 NOTE — Assessment & Plan Note (Signed)
>>  ASSESSMENT AND PLAN FOR ANXIETY WRITTEN ON 08/28/2015  3:01 PM BY Erasmo Downer, MD  Seems to be well controlled currently Not tearful during visit today which is a change from baseline Ativan refilled for 3 months

## 2015-08-28 NOTE — Assessment & Plan Note (Signed)
Improving as patient is not taking medications for 5 times weekly instead of 2-3 times weekly Stressed importance of taking medications as prescribed

## 2015-08-28 NOTE — Assessment & Plan Note (Signed)
Previously poorly controlled with A1c 8.1 Known peripheral neuropathy with microfilament testing today confirming this Recheck A1c Continue Amaryl Avoid metformin given previous suicide attempt

## 2015-08-28 NOTE — Assessment & Plan Note (Signed)
Hemoglobin improving when checked recently to 8.5 Iron panel today Continue daily iron supplement and consider increasing to twice or 3 times daily pending lab work

## 2015-08-28 NOTE — Assessment & Plan Note (Signed)
Previously poorly controlled with last TSH greater than 7 Patient now taking Synthroid about 5 times weekly Recheck TSH today Stressed importance of taking daily medication and inability to adjust medication when she is not taking daily May be contributing to fatigue and hot flashes

## 2015-08-28 NOTE — Patient Instructions (Signed)
Basis you again today. Continue taking your current medications. Please try to take your medications every day as they're prescribed. Is difficult to adjust them based on lab work with value taking them every day. We will see back in 3 months.  We are getting some lab work today and someone will call you or send you a letter with the results when they're available.  Take care, Dr. Jacinto Reap

## 2015-08-28 NOTE — Assessment & Plan Note (Signed)
>>  ASSESSMENT AND PLAN FOR HTN (HYPERTENSION) WRITTEN ON 08/28/2015  3:04 PM BY Erasmo Downer, MD  Not at goal today Was well-controlled while hospitalized within recent months Likely related to not taking medications daily Recent BMP reviewed Continue current medications at this time

## 2015-08-28 NOTE — Assessment & Plan Note (Signed)
Could be contributing to fatigue Has taken weekly supplement as she remembers for the last 4 months Recheck today Consider daily supplement

## 2015-08-28 NOTE — Assessment & Plan Note (Signed)
Creatinine stable Microalbumin today Continue to monitor

## 2015-08-28 NOTE — Progress Notes (Signed)
Subjective:   [name] is a 45 y.o. female with a history of Iron deficiency anemia, anxiety, major depressive disorder, morbid obesity, hypothyroidism, HTN, T2 DM here for anxiety follow-up and medication refills  Anxiety - uses ativan regularly TID - is helping symptoms - also taking buspar, clonidine, prozac, trazodone - from [place] - more anxious since school got out - denies SI/HI - thinks she is doing better than usual - started group therapy at [facility] 2 wks ago - seems to help  HTN: - Medications: amlodipine 10mg  daily  - taking ASA 81mg  daily - Compliance: taking ~5 days per week - Checking BP at home: no - Denies any SOB, CP, vision changes, LE edema, medication SEs, or symptoms of hypotension - Diet: reports it is not so good, wants to do better - Exercise: trying to walk more that ankle is better  T2DM - Checking BG at home: yes - but ran out of test strips - Medications: amaryl 4mg  daily - Compliance: taking 4-5 times daily - eye exam: got new glasses in 06/2015, but is planning to see optho - foot exam: 2015 - microalbumin: last in 2014 - denies symptoms of hypoglycemia, polyuria, polydipsia  Fatigue - also having hot flashes at night - energy level is better, but still sluggish - denies SI/HI - SOB has improved - taking iron daily with stool softener (no further constipation) - taking Vit D weekly since January - but misses doses sometimes  Hypothyroidism - taking Synthroid 310mcg daily - thinks she is taking it 4 days per week - constipation is better with stool softener - having hot flashes at night  Review of Systems:  Per HPI.   Social History: never smoker  Objective:  BP 139/96 mmHg  Pulse 83  Temp(Src) 98.9 F (37.2 C) (Oral)  Ht 5\' 11"  (1.803 m)  Wt 366 lb (166.017 kg)  BMI 51.07 kg/m2  LMP 08/06/2015  Gen:  45 y.o. female in NAD HEENT: NCAT, MMM, EOMI, PERRL, anicteric sclerae CV: RRR, no MRG Resp: Non-labored, CTAB, no  wheezes noted Ext: WWP, no edema MSK: No obvious deformities, surgical scar over right anterior ankle, gait intact Neuro: Alert and oriented, speech normal      Chemistry      Component Value Date/Time   NA 139 08/17/2015 1257   K 4.4 08/17/2015 1257   CL 106 08/17/2015 1257   CO2 28 08/17/2015 1257   BUN 15 08/17/2015 1257   CREATININE 1.45* 08/17/2015 1257   CREATININE 1.32* 04/10/2015 1656      Component Value Date/Time   CALCIUM 9.0 08/17/2015 1257   ALKPHOS 34* 05/09/2015 1432   AST 17 05/09/2015 1432   ALT 12* 05/09/2015 1432   BILITOT 0.4 05/09/2015 1432      Lab Results  Component Value Date   WBC 6.0 08/17/2015   HGB 8.5* 08/17/2015   HCT 29.4* 08/17/2015   MCV 74.8* 08/17/2015   PLT 477* 08/17/2015   Lab Results  Component Value Date   TSH 7.100* 04/10/2015   Lab Results  Component Value Date   HGBA1C 8.1* 05/09/2015   Assessment & Plan:     [name] is a 45 y.o. female here for   Vitamin D deficiency Could be contributing to fatigue Has taken weekly supplement as she remembers for the last 4 months Recheck today Consider daily supplement  Non compliance w medication regimen Improving as patient is not taking medications for 5 times weekly instead of  2-3 times weekly Stressed importance of taking medications as prescribed  Fatigue Likely multifactorial related to vitamin D deficiency, iron deficiency anemia, poorly controlled hypothyroidism Labs as above  Anxiety state Seems to be well controlled currently Not tearful during visit today which is a change from baseline Ativan refilled for 3 months  Iron deficiency anemia Hemoglobin improving when checked recently to 8.5 Iron panel today Continue daily iron supplement and consider increasing to twice or 3 times daily pending lab work  Chronic kidney disease (CKD), stage III (moderate) Creatinine stable Microalbumin today Continue to monitor  Type II diabetes mellitus with  complication (HCC) Previously poorly controlled with A1c 8.1 Known peripheral neuropathy with microfilament testing today confirming this Recheck A1c Continue Amaryl Avoid metformin given previous suicide attempt  Hypothyroidism Previously poorly controlled with last TSH greater than 7 Patient now taking Synthroid about 5 times weekly Recheck TSH today Stressed importance of taking daily medication and inability to adjust medication when she is not taking daily May be contributing to fatigue and hot flashes  Essential hypertension Not at goal today Was well-controlled while hospitalized within recent months Likely related to not taking medications daily Recent BMP reviewed Continue current medications at this time       Samantha Crews, MD MPH PGY-2,  [facility] 08/28/2015  3:04 PM

## 2015-08-28 NOTE — Assessment & Plan Note (Signed)
Likely multifactorial related to vitamin D deficiency, iron deficiency anemia, poorly controlled hypothyroidism Labs as above

## 2015-08-29 ENCOUNTER — Encounter: Payer: Self-pay | Admitting: Family Medicine

## 2015-08-29 LAB — MICROALBUMIN, URINE: MICROALB UR: 0.4 mg/dL

## 2015-08-29 LAB — VITAMIN D 25 HYDROXY (VIT D DEFICIENCY, FRACTURES): Vit D, 25-Hydroxy: 16 ng/mL — ABNORMAL LOW (ref 30–100)

## 2015-10-01 ENCOUNTER — Other Ambulatory Visit: Payer: Self-pay | Admitting: Family Medicine

## 2015-10-01 DIAGNOSIS — J309 Allergic rhinitis, unspecified: Secondary | ICD-10-CM

## 2015-11-27 ENCOUNTER — Encounter: Payer: Self-pay | Admitting: Family Medicine

## 2015-11-27 ENCOUNTER — Ambulatory Visit (INDEPENDENT_AMBULATORY_CARE_PROVIDER_SITE_OTHER): Payer: BC Managed Care – PPO | Admitting: Family Medicine

## 2015-11-27 VITALS — BP 147/97 | HR 97 | Temp 99.2°F | Ht 71.0 in | Wt 374.0 lb

## 2015-11-27 DIAGNOSIS — E118 Type 2 diabetes mellitus with unspecified complications: Secondary | ICD-10-CM

## 2015-11-27 DIAGNOSIS — F411 Generalized anxiety disorder: Secondary | ICD-10-CM

## 2015-11-27 DIAGNOSIS — J029 Acute pharyngitis, unspecified: Secondary | ICD-10-CM | POA: Diagnosis not present

## 2015-11-27 DIAGNOSIS — Z23 Encounter for immunization: Secondary | ICD-10-CM | POA: Diagnosis not present

## 2015-11-27 LAB — POCT RAPID STREP A (OFFICE): RAPID STREP A SCREEN: NEGATIVE

## 2015-11-27 LAB — POCT GLYCOSYLATED HEMOGLOBIN (HGB A1C): Hemoglobin A1C: 7.1

## 2015-11-27 MED ORDER — LORAZEPAM 1 MG PO TABS: 1.0000 mg | ORAL_TABLET | Freq: Three times a day (TID) | ORAL | 0 refills | Status: DC | PRN

## 2015-11-27 NOTE — Progress Notes (Signed)
   HPI  CC: Medication refill and sore throat Patient is here for a medication refill. She states that she regularly gets a 3 month prescription for her Ativan. She endorses good control with her medication regimen at this time. No changes in her status since last visit (psychologically). Does not feel any increase or decrease in dosage would be appropriate this time. Denies SI/HI.  SORE THROAT  Sore throat began 2 weeks ago. Pain is: dry, sharp Severity: moderate, difficult to swallow Medications tried: none Strep throat exposure: no STD exposure: no  Symptoms Fever: no Cough: no Runny nose: no Muscle aches: no Swollen Glands: possible Trouble breathing: no Drooling: no Weight loss: no  Patient believes could be caused by: throat cancer?  Review of Symptoms - see HPI PMH - Smoking status noted.    CC, SH/smoking status, and VS noted  Objective: BP (!) 147/97   Pulse 97   Temp 99.2 F (37.3 C) (Oral)   Ht 5\' 11"  (1.803 m)   Wt (!) 374 lb (169.6 kg)   LMP 10/09/2015   BMI 52.16 kg/m  Gen: NAD, alert, cooperative. CV: Well-perfused. Resp: Non-labored. Neuro: Sensation intact throughout. HEENT: MMM, EOMI, PERRLA, OP erythematous without evidence of exudate, TMs clear bilaterally, no LAD, neck full ROM  Assessment and plan:  Sore throat Complaining of sore throat and pain with swallowing 2 weeks. Rapid strep negative. No significant signs or symptoms suggesting strep infection. No fevers. Will treat conservatively at this time. - Tylenol for pain/fevers - Adequate hydration - Throat lozenges and/or honey at night for discomfort - Return precautions discussed  Anxiety state Patient here for refill on Ativan. As I am not her primary care provider I will only provide 1 month of medication at this time. Informed patient of this and she stated her understanding. - Patient to follow-up with PCP in 4 weeks   Orders Placed This Encounter  Procedures  . Flu Vaccine  QUAD 36+ mos IM  . HgB A1c  . POCT rapid strep A    Meds ordered this encounter  Medications  . LORazepam (ATIVAN) 1 MG tablet    Sig: Take 1 tablet (1 mg total) by mouth every 8 (eight) hours as needed for anxiety. Do not fill <30 days from last refill    Dispense:  90 tablet    Refill:  0     Elberta Leatherwood, MD,MS,  PGY3 11/27/2015 5:43 PM

## 2015-11-27 NOTE — Assessment & Plan Note (Signed)
Patient here for refill on Ativan. As I am not her primary care provider I will only provide 1 month of medication at this time. Informed patient of this and she stated her understanding. - Patient to follow-up with PCP in 4 weeks

## 2015-11-27 NOTE — Patient Instructions (Signed)
Pharyngitis Treatment - you should: -  Take over-the-counter Tylenol as directed on the bottles for fever, pain, and/or inflammation. - Coughed or during the day and a spoonful of honey before bed may help with some of the throat discomfort and pain.  You should be better in: 5-7 days Call us if you have severe shortness of breath, high fever or are not better in 2 weeks   Pharyngitis Pharyngitis is redness, pain, and swelling (inflammation) of your pharynx.  CAUSES  Pharyngitis is usually caused by infection. Most of the time, these infections are from viruses (viral) and are part of a cold. However, sometimes pharyngitis is caused by bacteria (bacterial). Pharyngitis can also be caused by allergies. Viral pharyngitis may be spread from person to person by coughing, sneezing, and personal items or utensils (cups, forks, spoons, toothbrushes). Bacterial pharyngitis may be spread from person to person by more intimate contact, such as kissing.  SIGNS AND SYMPTOMS  Symptoms of pharyngitis include:   Sore throat.   Tiredness (fatigue).   Low-grade fever.   Headache.  Joint pain and muscle aches.  Skin rashes.  Swollen lymph nodes.  Plaque-like film on throat or tonsils (often seen with bacterial pharyngitis). DIAGNOSIS  Your health care provider will ask you questions about your illness and your symptoms. Your medical history, along with a physical exam, is often all that is needed to diagnose pharyngitis. Sometimes, a rapid strep test is done. Other lab tests may also be done, depending on the suspected cause.  TREATMENT  Viral pharyngitis will usually get better in 3-4 days without the use of medicine. Bacterial pharyngitis is treated with medicines that kill germs (antibiotics).  HOME CARE INSTRUCTIONS   Drink enough water and fluids to keep your urine clear or pale yellow.   Only take over-the-counter or prescription medicines as directed by your health care provider:    If you are prescribed antibiotics, make sure you finish them even if you start to feel better.   Do not take aspirin.   Get lots of rest.   Gargle with 8 oz of salt water ( tsp of salt per 1 qt of water) as often as every 1-2 hours to soothe your throat.   Throat lozenges (if you are not at risk for choking) or sprays may be used to soothe your throat. SEEK MEDICAL CARE IF:   You have large, tender lumps in your neck.  You have a rash.  You cough up green, yellow-brown, or bloody spit. SEEK IMMEDIATE MEDICAL CARE IF:   Your neck becomes stiff.  You drool or are unable to swallow liquids.  You vomit or are unable to keep medicines or liquids down.  You have severe pain that does not go away with the use of recommended medicines.  You have trouble breathing (not caused by a stuffy nose). MAKE SURE YOU:   Understand these instructions.  Will watch your condition.  Will get help right away if you are not doing well or get worse.   This information is not intended to replace advice given to you by your health care provider. Make sure you discuss any questions you have with your health care provider.   Document Released: 02/24/2005 Document Revised: 12/15/2012 Document Reviewed: 11/01/2012 Elsevier Interactive Patient Education [employer].

## 2015-11-27 NOTE — Assessment & Plan Note (Signed)
>>  ASSESSMENT AND PLAN FOR ANXIETY WRITTEN ON 11/27/2015  5:43 PM BY MCKEAG, IAN D, MD  Patient here for refill on Ativan. As I am not her primary care provider I will only provide 1 month of medication at this time. Informed patient of this and she stated her understanding. - Patient to follow-up with PCP in 4 weeks

## 2015-11-27 NOTE — Assessment & Plan Note (Signed)
Complaining of sore throat and pain with swallowing 2 weeks. Rapid strep negative. No significant signs or symptoms suggesting strep infection. No fevers. Will treat conservatively at this time. - Tylenol for pain/fevers - Adequate hydration - Throat lozenges and/or honey at night for discomfort - Return precautions discussed

## 2015-11-28 ENCOUNTER — Telehealth: Payer: Self-pay | Admitting: Family Medicine

## 2015-11-28 NOTE — Telephone Encounter (Signed)
Pt was seen by dr yesterday for sore throat. She is requesting a drs note for today. She says her throat is really sore and she can hardly talk so she did not go to work. Please advise

## 2015-11-30 NOTE — Telephone Encounter (Signed)
I'm sorry but unless patient's status had changed from when I saw her, I do not feel it appropriate to provide this. Patient is welcome to discuss this with her PCP.

## 2015-12-05 NOTE — Telephone Encounter (Signed)
Will leave decision up to provider that saw the patient.  I cannot give work note without having seen patient.  Virginia Crews, MD, MPH PGY-3,  [facility] 12/05/2015 11:56 AM

## 2015-12-31 ENCOUNTER — Other Ambulatory Visit: Payer: Self-pay | Admitting: Family Medicine

## 2015-12-31 DIAGNOSIS — F411 Generalized anxiety disorder: Secondary | ICD-10-CM

## 2015-12-31 MED ORDER — LORAZEPAM 1 MG PO TABS: 1.0000 mg | ORAL_TABLET | Freq: Three times a day (TID) | ORAL | 0 refills | Status: DC | PRN

## 2015-12-31 NOTE — Telephone Encounter (Signed)
Was seen in sept for Ativan refill.  Dr Alease Frame only gave her one months supply. Tomorrow is the day she runs out.  Please call her home number and leave a message with her husband about when she can pick this up

## 2015-12-31 NOTE — Telephone Encounter (Signed)
Patient husband informed. Will have patient call back to schedule appointment this week.

## 2015-12-31 NOTE — Telephone Encounter (Signed)
Please call patient and let her know that Rx for 5 days worth of ativan available to pick up.  As patient knows, refills of controleld medications require visit.  She can schedule visit with me later this week.    Virginia Crews, MD, MPH PGY-3,  [facility] 12/31/2015 1:31 PM

## 2016-01-07 ENCOUNTER — Encounter: Payer: Self-pay | Admitting: Family Medicine

## 2016-01-07 ENCOUNTER — Ambulatory Visit (INDEPENDENT_AMBULATORY_CARE_PROVIDER_SITE_OTHER): Payer: BC Managed Care – PPO | Admitting: Family Medicine

## 2016-01-07 VITALS — BP 140/88 | HR 71 | Temp 98.3°F | Ht 71.0 in | Wt 376.0 lb

## 2016-01-07 DIAGNOSIS — I10 Essential (primary) hypertension: Secondary | ICD-10-CM

## 2016-01-07 DIAGNOSIS — E118 Type 2 diabetes mellitus with unspecified complications: Secondary | ICD-10-CM | POA: Diagnosis not present

## 2016-01-07 DIAGNOSIS — F411 Generalized anxiety disorder: Secondary | ICD-10-CM

## 2016-01-07 DIAGNOSIS — E038 Other specified hypothyroidism: Secondary | ICD-10-CM | POA: Diagnosis not present

## 2016-01-07 DIAGNOSIS — T148XXA Other injury of unspecified body region, initial encounter: Secondary | ICD-10-CM

## 2016-01-07 MED ORDER — LORAZEPAM 1 MG PO TABS: 1.0000 mg | ORAL_TABLET | Freq: Three times a day (TID) | ORAL | 0 refills | Status: DC | PRN

## 2016-01-07 MED ORDER — GLIMEPIRIDE 2 MG PO TABS: 4.0000 mg | ORAL_TABLET | Freq: Every day | ORAL | 3 refills | Status: DC

## 2016-01-07 MED ORDER — LEVOTHYROXINE SODIUM 300 MCG PO TABS: 300.0000 ug | ORAL_TABLET | Freq: Every day | ORAL | 3 refills | Status: DC

## 2016-01-07 MED ORDER — AMLODIPINE BESYLATE 10 MG PO TABS: 10.0000 mg | ORAL_TABLET | Freq: Every day | ORAL | 3 refills | Status: DC

## 2016-01-07 NOTE — Assessment & Plan Note (Signed)
Uncontrolled with recent A1c 7.1 Continue Amaryl Advised patient to take this daily Avoid metformin given previous suicide attempt Follow-up in 3 months Foot exam completed today Up-to-date on eye exam and urine microalbumin

## 2016-01-07 NOTE — Progress Notes (Signed)
Subjective:   [name] is a 45 y.o. female with a history of HTN, T2 DM, hypothyroidism, MDD, anxiety, morbid obesity here for med refills  Anxiety - uses ativan regularly TID - is helping symptoms - also taking buspar, clonidine, prozac, trazodone - from [place] - denies SI/HI  - no longer able to do group therapy at [facility] as it is during the school day - had a sad spell ~2wks ago missing son  T2DM - Checking BG at home: no - Medications: Amaryl 4 mg daily - Compliance: only taking 3-4 days per week - cost is prohibited - Diet: eat what I can when I can - Exercise: walk a lot at work - eye exam: 04/2015 - will have records sent - foot exam: needs today - microalbumin: UTD - denies symptoms of hypoglycemia, polyuria, polydipsia, numbness extremities, foot ulcers/trauma  HTN: - Medications: Amlodipine 10 mg daily - Taking aspirin 81 mg daily - Compliance: taking ~3 days/wks - cost is prohibitive - Checking BP at home: no - Denies any SOB, CP, vision changes, medication SEs, or symptoms of hypotension - mild LE edema at end of day  Sore on ankle - was like a bubble that popped - Ortho said to ask PCP - Nothing rubs on that area - but brace is over top of area - No redness or fevers   Review of Systems:  Per HPI.   Social History: Never smoker  Objective:  BP 140/88   Pulse 71   Temp 98.3 F (36.8 C) (Oral)   Ht 5\' 11"  (1.803 m)   Wt (!) 376 lb (170.6 kg)   SpO2 97%   BMI 52.44 kg/m   Gen:  45 y.o. female in NAD HEENT: NCAT, MMM, EOMI, PERRL, anicteric sclerae CV: RRR, no MRG Resp: Non-labored, CTAB, no wheezes noted Abd: Soft, NTND Ext: WWP, trace edema MSK: no obvious deformities, small healing blister on R anterior ankle, no erythema Neuro: Alert and oriented, speech normal      Chemistry      Component Value Date/Time   NA 139 08/17/2015 1257   K 4.4 08/17/2015 1257   CL 106 08/17/2015 1257   CO2 28 08/17/2015 1257   BUN 15 08/17/2015  1257   CREATININE 1.45 (H) 08/17/2015 1257   CREATININE 1.32 (H) 04/10/2015 1656      Component Value Date/Time   CALCIUM 9.0 08/17/2015 1257   ALKPHOS 34 (L) 05/09/2015 1432   AST 17 05/09/2015 1432   ALT 12 (L) 05/09/2015 1432   BILITOT 0.4 05/09/2015 1432      Lab Results  Component Value Date   WBC 6.0 08/17/2015   HGB 8.5 (L) 08/17/2015   HCT 29.4 (L) 08/17/2015   MCV 74.8 (L) 08/17/2015   PLT 477 (H) 08/17/2015   Lab Results  Component Value Date   TSH 3.39 08/28/2015   Lab Results  Component Value Date   HGBA1C 7.1 11/27/2015   Assessment & Plan:     [name] is a 45 y.o. female here for   Essential hypertension, benign Uncontrolled Only taking medications about 3 times per week Continue amlodipine 10 mg daily Advised patient that she should try to take this every day if able to afford it If edema worsens can consider switching to HCTZ instead of amlodipine Follow-up in 3 months  Type II diabetes mellitus with complication ([place]) Uncontrolled with recent A1c 7.1 Continue Amaryl Advised patient to take this daily Avoid metformin given  previous suicide attempt Follow-up in 3 months Foot exam completed today Up-to-date on eye exam and urine microalbumin  Anxiety state Ativan refilled 3 months Doing well Advised counseling if able Follow-up in 3 months      Samantha Crews, MD MPH PGY-3,  [facility] 01/07/2016  11:25 AM

## 2016-01-07 NOTE — Assessment & Plan Note (Signed)
Ativan refilled 3 months Doing well Advised counseling if able Follow-up in 3 months

## 2016-01-07 NOTE — Assessment & Plan Note (Signed)
>>  ASSESSMENT AND PLAN FOR ANXIETY WRITTEN ON 01/07/2016 11:25 AM BY BACIGALUPO, Marzella Schlein, MD  Ativan refilled 3 months Doing well Advised counseling if able Follow-up in 3 months

## 2016-01-07 NOTE — Assessment & Plan Note (Signed)
Uncontrolled Only taking medications about 3 times per week Continue amlodipine 10 mg daily Advised patient that she should try to take this every day if able to afford it If edema worsens can consider switching to HCTZ instead of amlodipine Follow-up in 3 months

## 2016-01-07 NOTE — Patient Instructions (Signed)

## 2016-02-07 ENCOUNTER — Emergency Department (HOSPITAL_COMMUNITY): Payer: BC Managed Care – PPO

## 2016-02-07 ENCOUNTER — Encounter (HOSPITAL_COMMUNITY): Payer: Self-pay

## 2016-02-07 ENCOUNTER — Emergency Department (HOSPITAL_COMMUNITY)
Admission: EM | Admit: 2016-02-07 | Discharge: 2016-02-07 | Disposition: A | Discharge status: Home / Self Care | Payer: BC Managed Care – PPO | Attending: Emergency Medicine | Admitting: Emergency Medicine

## 2016-02-07 DIAGNOSIS — Z7984 Long term (current) use of oral hypoglycemic drugs: Secondary | ICD-10-CM | POA: Insufficient documentation

## 2016-02-07 DIAGNOSIS — I129 Hypertensive chronic kidney disease with stage 1 through stage 4 chronic kidney disease, or unspecified chronic kidney disease: Secondary | ICD-10-CM | POA: Insufficient documentation

## 2016-02-07 DIAGNOSIS — E119 Type 2 diabetes mellitus without complications: Secondary | ICD-10-CM | POA: Insufficient documentation

## 2016-02-07 DIAGNOSIS — J45901 Unspecified asthma with (acute) exacerbation: Secondary | ICD-10-CM | POA: Insufficient documentation

## 2016-02-07 DIAGNOSIS — Z7982 Long term (current) use of aspirin: Secondary | ICD-10-CM | POA: Diagnosis not present

## 2016-02-07 DIAGNOSIS — E039 Hypothyroidism, unspecified: Secondary | ICD-10-CM | POA: Diagnosis not present

## 2016-02-07 DIAGNOSIS — Z91040 Latex allergy status: Secondary | ICD-10-CM | POA: Insufficient documentation

## 2016-02-07 DIAGNOSIS — R55 Syncope and collapse: Secondary | ICD-10-CM | POA: Insufficient documentation

## 2016-02-07 DIAGNOSIS — N183 Chronic kidney disease, stage 3 (moderate): Secondary | ICD-10-CM | POA: Diagnosis not present

## 2016-02-07 LAB — BASIC METABOLIC PANEL
Anion gap: 6 (ref 5–15)
BUN: 13 mg/dL (ref 6–20)
CHLORIDE: 104 mmol/L (ref 101–111)
CO2: 26 mmol/L (ref 22–32)
CREATININE: 1.4 mg/dL — AB (ref 0.44–1.00)
Calcium: 8.9 mg/dL (ref 8.9–10.3)
GFR calc Af Amer: 51 mL/min — ABNORMAL LOW (ref >60)
GFR calc non Af Amer: 44 mL/min — ABNORMAL LOW (ref >60)
Glucose, Bld: 158 mg/dL — ABNORMAL HIGH (ref 65–99)
POTASSIUM: 4.6 mmol/L (ref 3.5–5.1)
Sodium: 136 mmol/L (ref 135–145)

## 2016-02-07 LAB — CBC
HEMATOCRIT: 31.5 % — AB (ref 36.0–46.0)
Hemoglobin: 9.5 g/dL — ABNORMAL LOW (ref 12.0–15.0)
MCH: 24.4 pg — ABNORMAL LOW (ref 26.0–34.0)
MCHC: 30.2 g/dL (ref 30.0–36.0)
MCV: 80.8 fL (ref 78.0–100.0)
PLATELETS: 432 10*3/uL — AB (ref 150–400)
RBC: 3.9 MIL/uL (ref 3.87–5.11)
RDW: 15.5 % (ref 11.5–15.5)
WBC: 7.3 10*3/uL (ref 4.0–10.5)

## 2016-02-07 LAB — POC URINE PREG, ED: PREG TEST UR: NEGATIVE

## 2016-02-07 MED ORDER — ACETAMINOPHEN 500 MG PO TABS
1000.0000 mg | ORAL_TABLET | Freq: Once | ORAL | Status: AC
  Administered 2016-02-07: 1000 mg via ORAL
  Filled 2016-02-07: qty 2

## 2016-02-07 MED ORDER — METOCLOPRAMIDE HCL 5 MG/ML IJ SOLN
10.0000 mg | Freq: Once | INTRAMUSCULAR | Status: AC
  Administered 2016-02-07: 10 mg via INTRAVENOUS
  Filled 2016-02-07: qty 2

## 2016-02-07 MED ORDER — DIPHENHYDRAMINE HCL 50 MG/ML IJ SOLN
25.0000 mg | Freq: Once | INTRAMUSCULAR | Status: AC
  Administered 2016-02-07: 25 mg via INTRAVENOUS
  Filled 2016-02-07: qty 1

## 2016-02-07 MED ORDER — METHYLPREDNISOLONE SODIUM SUCC 125 MG IJ SOLR
125.0000 mg | Freq: Once | INTRAMUSCULAR | Status: AC
  Administered 2016-02-07: 125 mg via INTRAVENOUS
  Filled 2016-02-07: qty 2

## 2016-02-07 MED ORDER — IPRATROPIUM-ALBUTEROL 0.5-2.5 (3) MG/3ML IN SOLN
3.0000 mL | Freq: Once | RESPIRATORY_TRACT | Status: AC
  Administered 2016-02-07: 3 mL via RESPIRATORY_TRACT
  Filled 2016-02-07: qty 3

## 2016-02-07 MED ORDER — PREDNISONE 10 MG PO TABS: 40.0000 mg | ORAL_TABLET | Freq: Every day | ORAL | 0 refills | Status: AC

## 2016-02-07 NOTE — ED Notes (Signed)
Placed patient into a gown and on the monitor did ekg shown to the doctor

## 2016-02-07 NOTE — ED Triage Notes (Addendum)
Pt. Was at work when she started coughing and when she bent down to grab some water she experienced a syncopal event and witnesses state that she did hit her head. Pt. Remembers the coughing and bending and is alert to person and place but disoriented to time. She can't remember her birthday or the year. Hy. Of syncope and asthma. VItals WDL. Has had a productive cough for 3 weeks

## 2016-02-07 NOTE — ED Notes (Signed)
Papers reviewed with patient and she verbalizes understanding and intent to follow up. IV removed, work note given

## 2016-02-07 NOTE — ED Provider Notes (Signed)
[facility] DEPT Provider Note   CSN: [id number] Arrival date & time: 02/07/16  1037     History   Chief Complaint Chief Complaint  Patient presents with  . Loss of Consciousness    HPI AKHILA MAHNKEN is a 45 y.o. female.  HPI   Cough for 3 weeks Started coughing, sitting in folding chair, bent over to pick up water and had syncope Bystander said 20sec.  Took a while to come back around, not sure how long. Now at baseline.   3wk passing out 1-2 times per day/night, she has slight shake but no rhythmic jerking, no tongue biting, no bowel incontinence Has urinary incontinence with coughing prior to syncope  Hx of cough off and on for years, and with severe coughing episodes causing syncope for years.  PCP wanted her to see pulmonologist but copay was to high. Admitted and monitoring prior, didn't see anything.  Frequent coughing to the point of syncope. Has not seen cardiologist or specialist. Has not worn holter monitor at home.  No smoking hx. Adult onset asthma 1 yr ago.    Past Medical History:  Diagnosis Date  . Acute renal failure ([place]) 05/27/10   hemodialysis for 6 weeks  . Anxiety   . Depression   . Diabetes mellitus   . Hypertension   . Rhabdomyolysis 06/06/10   after drug overdose  . Suicide attempt by drug ingestion ([place]) 06/05/10   result rhabdomyolosis and ARF requrining dialysis     Patient Active Problem List   Diagnosis Date Noted  . MDD (major depressive disorder), recurrent severe, without psychosis ([place]) 05/11/2015  . Dyspnea 05/09/2015  . Essential hypertension   . Vitamin D deficiency 04/11/2015  . Fatigue 04/10/2015  . Non compliance w medication regimen 04/10/2015  . Sore throat 02/08/2015  . Golfer's elbow 12/22/2014  . Type 2 diabetes mellitus with complication ([place])   . Thyroid activity decreased   . Adjustment disorder with mixed anxiety and depressed mood   . Asthma with acute exacerbation 06/22/2014  . Grief at loss of child  06/22/2014  . Dysmenorrhea 11/25/2013  . Frequent urination 10/20/2012  . Furuncle 07/07/2012  . Chronic kidney disease (CKD), stage III (moderate) 11/06/2010  . ENDOMETRIAL HYPERPLASIA UNSPECIFIED 02/07/2008  . MENORRHAGIA 01/03/2008  . Allergic rhinitis 06/24/2007  . Essential hypertension, benign 02/03/2007  . Hypothyroidism 05/07/2006  . Type II diabetes mellitus with complication ([place]) 78/67/6720  . Morbid obesity ([place]) 05/07/2006  . Iron deficiency anemia 05/07/2006  . Major depressive disorder, recurrent episode ([place]) 05/07/2006  . Anxiety state 05/07/2006  . OBSESSIVE COMPUL. DISORDER 05/07/2006    Past Surgical History:  Procedure Laterality Date  . ANKLE ARTHROSCOPY Right 08/17/2015   Procedure: RIGHT ANKLE ARTHROSCOPY WITH SYNOVECTOMY AND LOOSE BODY EXCISION;  Surgeon: Melrose Nakayama, MD;  Location: [facility];  Service: Orthopedics;  Laterality: Right;  . CHOLECYSTECTOMY    . TUBAL LIGATION  1998    OB History    No data available       Home Medications    Prior to Admission medications   Medication Sig Start Date End Date Taking? Authorizing Provider  acetaminophen (TYLENOL) 500 MG tablet Take 1,000 mg by mouth every 6 (six) hours as needed for moderate pain.    Historical Provider, MD  albuterol-ipratropium (COMBIVENT) 18-103 MCG/ACT inhaler Inhale 1 puff into the lungs every 4 (four) hours as needed for wheezing or shortness of breath. 05/12/15   Hillary Corinda Gubler, MD  amLODipine (NORVASC) 10 MG  tablet Take 1 tablet (10 mg total) by mouth daily. 01/07/16   Virginia Crews, MD  aspirin EC 81 MG tablet Take 81 mg by mouth daily.    Historical Provider, MD  budesonide (PULMICORT) 180 MCG/ACT inhaler Inhale 1 puff into the lungs 2 (two) times daily. 05/12/15   Hillary Corinda Gubler, MD  busPIRone (BUSPAR) 10 MG tablet Take 10 mg by mouth 2 (two) times daily.    Historical Provider, MD  cetirizine (ZYRTEC) 10 MG tablet TAKE ONE TABLET BY MOUTH ONCE DAILY 10/01/15    Virginia Crews, MD  dextromethorphan (DELSYM) 30 MG/5ML liquid Take 60 mg by mouth as needed for cough.    Historical Provider, MD  docusate sodium (COLACE) 100 MG capsule Take 200 mg by mouth at bedtime.    Historical Provider, MD  ferrous sulfate 325 (65 FE) MG tablet Take 1 tablet (325 mg total) by mouth 3 (three) times daily with meals. Patient taking differently: Take 325 mg by mouth daily with breakfast.  04/10/15   Virginia Crews, MD  FLUoxetine (PROZAC) 40 MG capsule Take 80 mg by mouth daily.     Historical Provider, MD  fluticasone (FLONASE) 50 MCG/ACT nasal spray Place 2 sprays into both nostrils daily. Patient taking differently: Place 2 sprays into both nostrils daily as needed for allergies.  09/24/14   Virginia Crews, MD  glimepiride (AMARYL) 2 MG tablet Take 2 tablets (4 mg total) by mouth daily before breakfast. 01/07/16   Virginia Crews, MD  guaiFENesin (MUCINEX) 600 MG 12 hr tablet Take 600 mg by mouth 2 (two) times daily as needed for cough or to loosen phlegm.    Historical Provider, MD  guaiFENesin-dextromethorphan (ROBITUSSIN DM) 100-10 MG/5ML syrup Take 5 mLs by mouth every 4 (four) hours as needed for cough. 09/24/14   Virginia Crews, MD  ipratropium (ATROVENT) 0.02 % nebulizer solution Take 1.25 mLs (0.25 mg total) by nebulization every 6 (six) hours as needed for wheezing or shortness of breath. 05/12/15   Hillary Corinda Gubler, MD  ipratropium-albuterol (DUONEB) 0.5-2.5 (3) MG/3ML SOLN Take 3 mLs by nebulization 3 (three) times daily as needed. 05/12/15   Hillary Corinda Gubler, MD  levothyroxine (SYNTHROID, LEVOTHROID) 300 MCG tablet Take 1 tablet (300 mcg total) by mouth daily. 01/07/16   Virginia Crews, MD  LORazepam (ATIVAN) 1 MG tablet Take 1 tablet (1 mg total) by mouth every 8 (eight) hours as needed for anxiety. Do not fill <30 days from last refill 01/07/16   Virginia Crews, MD  LORazepam (ATIVAN) 1 MG tablet Take 1 tablet (1 mg  total) by mouth every 8 (eight) hours as needed for anxiety. Do not fill <30 days from last refill 01/07/16   Virginia Crews, MD  LORazepam (ATIVAN) 1 MG tablet Take 1 tablet (1 mg total) by mouth 3 (three) times daily as needed for anxiety. Do not fill before 30 days after last refill. 01/07/16   Virginia Crews, MD  Menthol (HALLS COUGH DROPS MT) Use as directed 1 lozenge in the mouth or throat every 2 (two) hours as needed (for cough).    Historical Provider, MD  predniSONE (DELTASONE) 10 MG tablet Take 4 tablets (40 mg total) by mouth daily. 02/07/16 02/11/16  Gareth Morgan, MD  traZODone (DESYREL) 100 MG tablet Take 150 mg by mouth at bedtime.  02/26/13   Historical Provider, MD  Vitamin D, Ergocalciferol, (DRISDOL) 50000 units CAPS capsule Take 1 capsule (50,000 Units total)  by mouth every 7 (seven) days. 04/11/15   Virginia Crews, MD    Family History No family history on file.  Social History Social History  Substance Use Topics  . Smoking status: Never Smoker  . Smokeless tobacco: Never Used  . Alcohol use No     Allergies   Ace inhibitors; Haldol [haloperidol lactate]; Metformin and related; Nsaids; and Latex   Review of Systems Review of Systems  Constitutional: Negative for appetite change and fever.  HENT: Positive for sore throat (from cough). Negative for congestion.   Respiratory: Positive for cough (yellow mucus, thick phlegm) and shortness of breath (over last 3 wk, takes longer to do everything).   Cardiovascular: Positive for chest pain (from continuous coughing) and palpitations (since being sick).  Gastrointestinal: Positive for nausea. Negative for abdominal pain, blood in stool, constipation, diarrhea and vomiting (the other day, now improved).  Genitourinary: Positive for frequency. Negative for dysuria.  Musculoskeletal: Positive for back pain (from coughing).  Skin: Negative for rash.  Neurological: Positive for syncope and headaches.      Physical Exam Updated Vital Signs BP 133/80   Pulse 82   Temp 98.2 F (36.8 C) (Oral)   Resp 19   Ht 5\' 11"  (1.803 m)   Wt (!) 365 lb (165.6 kg)   SpO2 97%   BMI 50.91 kg/m   Physical Exam  Constitutional: She is oriented to person, place, and time. She appears well-developed and well-nourished. No distress.  HENT:  Head: Normocephalic and atraumatic. Head is without raccoon's eyes, without Battle's sign and without contusion.  Right Ear: No hemotympanum.  Left Ear: No hemotympanum.  Tenderness left side of forehead/temple with no sign of trauma, no contusion, no palpable skull fx   Eyes: Conjunctivae and EOM are normal.  Neck: Normal range of motion.  Cardiovascular: Normal rate, regular rhythm, normal heart sounds and intact distal pulses.  Exam reveals no gallop and no friction rub.   No murmur heard. Pulmonary/Chest: Effort normal. No respiratory distress. She has wheezes. She has no rales.  Abdominal: Soft. She exhibits no distension. There is no tenderness. There is no guarding.  Musculoskeletal: She exhibits no edema or tenderness.  Neurological: She is alert and oriented to person, place, and time. She has normal strength. No cranial nerve deficit or sensory deficit. Coordination normal. GCS eye subscore is 4. GCS verbal subscore is 5. GCS motor subscore is 6.  Skin: Skin is warm and dry. No rash noted. She is not diaphoretic. No erythema.  Nursing note and vitals reviewed.    ED Treatments / Results  Labs (all labs ordered are listed, but only abnormal results are displayed) Labs Reviewed  BASIC METABOLIC PANEL - Abnormal; Notable for the following:       Result Value   Glucose, Bld 158 (*)    Creatinine, Ser 1.40 (*)    GFR calc non Af Amer 44 (*)    GFR calc Af Amer 51 (*)    All other components within normal limits  CBC - Abnormal; Notable for the following:    Hemoglobin 9.5 (*)    HCT 31.5 (*)    MCH 24.4 (*)    Platelets 432 (*)    All other  components within normal limits  POC URINE PREG, ED    EKG  EKG Interpretation  Date/Time:  Thursday February 07 2016 10:46:41 EST Ventricular Rate:  80 PR Interval:    QRS Duration: 97 QT Interval:  395 QTC Calculation:  456 R Axis:   73 Text Interpretation:  Sinus rhythm No significant change since last tracing Confirmed by [facility] MD, [place] ([id number]) on 02/07/2016 10:56:50 AM       Radiology Dg Chest 2 View  Result Date: 02/07/2016 CLINICAL DATA:  Coughing episode at work with syncopal episode. Patient porch 3 weeks of cough. History of asthma and diabetes. Nonsmoker EXAM: CHEST  2 VIEW COMPARISON:  Chest x-ray of May 09, 2015 FINDINGS: The lungs are adequately inflated. There is no focal infiltrate. There is no pleural effusion or pneumothorax. The heart and pulmonary vascularity are normal. The mediastinum is normal in width. The bony thorax exhibits no acute abnormality. IMPRESSION: There is no active cardiopulmonary disease. Electronically Signed   By: David  Martinique M.D.   On: 02/07/2016 11:58    Procedures Procedures (including critical care time)  Medications Ordered in ED Medications  acetaminophen (TYLENOL) tablet 1,000 mg (1,000 mg Oral Given 02/07/16 1129)  diphenhydrAMINE (BENADRYL) injection 25 mg (25 mg Intravenous Given 02/07/16 1129)  metoCLOPramide (REGLAN) injection 10 mg (10 mg Intravenous Given 02/07/16 1129)  methylPREDNISolone sodium succinate (SOLU-MEDROL) 125 mg/2 mL injection 125 mg (125 mg Intravenous Given 02/07/16 1303)  ipratropium-albuterol (DUONEB) 0.5-2.5 (3) MG/3ML nebulizer solution 3 mL (3 mLs Nebulization Given 02/07/16 1303)     Initial Impression / Assessment and Plan / ED Course  I have reviewed the triage vital signs and the nursing notes.  Pertinent labs & imaging results that were available during my care of the patient were reviewed by me and considered in my medical decision making (see chart for details).  Clinical Course     29 her old female with a history of diabetes, hypertension, chronic kidney disease, major depressive disorder with hx of suicide attempt, presents with concern for syncopal episode. Patient started coughing, and then went to grab water and had an episode of syncope.   EKG evaluated by me and shows sinus rhythm with no sign of prolonged QTc, no brugada, no delta waves, no sign of HOCM, no ST abnormalities. Labs done showing no sign of significant anemia nor significant electrolyte abnormalities to account for her episode of syncope. Pregnancy test was negative. Chest x-ray shows no sign of pneumonia.  Patient monitored on telemetry without arrhythmia.  Cough likely secondary to asthma exacerbation. Given steroids, nebs with improvement. Given rx for prednisone.  Syncope likely reflex syncope in setting of cough. Patient discharged in stable condition with understanding of reasons to return.   Final Clinical Impressions(s) / ED Diagnoses   Final diagnoses:  Vasovagal syncope/Cough/reflex syncope  Moderate asthma with exacerbation, unspecified whether persistent    New Prescriptions Discharge Medication List as of 02/07/2016  2:49 PM    START taking these medications   Details  predniSONE (DELTASONE) 10 MG tablet Take 4 tablets (40 mg total) by mouth daily., Starting Thu 02/07/2016, Until Mon 02/11/2016, Print         Gareth Morgan, MD 02/08/16 [phone]

## 2016-02-07 NOTE — ED Notes (Signed)
Asked patient if she need to use the restroom so we could collect urine and she sts that she went in x-ray

## 2016-02-22 ENCOUNTER — Encounter: Payer: Self-pay | Admitting: Internal Medicine

## 2016-02-22 ENCOUNTER — Ambulatory Visit (INDEPENDENT_AMBULATORY_CARE_PROVIDER_SITE_OTHER): Payer: BC Managed Care – PPO | Admitting: Internal Medicine

## 2016-02-22 ENCOUNTER — Other Ambulatory Visit (INDEPENDENT_AMBULATORY_CARE_PROVIDER_SITE_OTHER): Payer: BC Managed Care – PPO

## 2016-02-22 VITALS — BP 140/82 | HR 100 | Ht 71.0 in | Wt 372.2 lb

## 2016-02-22 DIAGNOSIS — R05 Cough: Secondary | ICD-10-CM | POA: Diagnosis not present

## 2016-02-22 DIAGNOSIS — R058 Other specified cough: Secondary | ICD-10-CM

## 2016-02-22 LAB — CBC WITH DIFFERENTIAL/PLATELET
Basophils Absolute: 0 10*3/uL (ref 0.0–0.1)
Basophils Relative: 0.6 % (ref 0.0–3.0)
EOS PCT: 1.6 % (ref 0.0–5.0)
Eosinophils Absolute: 0.1 10*3/uL (ref 0.0–0.7)
HCT: 32.9 % — ABNORMAL LOW (ref 36.0–46.0)
Hemoglobin: 10.4 g/dL — ABNORMAL LOW (ref 12.0–15.0)
LYMPHS ABS: 2.5 10*3/uL (ref 0.7–4.0)
LYMPHS PCT: 29.5 % (ref 12.0–46.0)
MCHC: 31.6 g/dL (ref 30.0–36.0)
MCV: 78.5 fl (ref 78.0–100.0)
MONO ABS: 0.4 10*3/uL (ref 0.1–1.0)
Monocytes Relative: 4.2 % (ref 3.0–12.0)
NEUTROS ABS: 5.4 10*3/uL (ref 1.4–7.7)
NEUTROS PCT: 64.1 % (ref 43.0–77.0)
PLATELETS: 513 10*3/uL — AB (ref 150.0–400.0)
RBC: 4.19 Mil/uL (ref 3.87–5.11)
RDW: 16.3 % — ABNORMAL HIGH (ref 11.5–15.5)
WBC: 8.4 10*3/uL (ref 4.0–10.5)

## 2016-02-22 MED ORDER — PANTOPRAZOLE SODIUM 40 MG PO TBEC: 40.0000 mg | DELAYED_RELEASE_TABLET | Freq: Every day | ORAL | 2 refills | Status: DC

## 2016-02-22 MED ORDER — ACETAMINOPHEN-CODEINE #3 300-30 MG PO TABS: ORAL_TABLET | ORAL | 0 refills | Status: DC

## 2016-02-22 MED ORDER — FAMOTIDINE 20 MG PO TABS: ORAL_TABLET | ORAL | 2 refills | Status: DC

## 2016-02-22 MED ORDER — PREDNISONE 10 MG PO TABS: ORAL_TABLET | ORAL | 0 refills | Status: DC

## 2016-02-22 NOTE — Patient Instructions (Addendum)
Stop proair, zyrtec, robitussin/guafensin, menthol cough drops and   pulmicort   The key to effective treatment for your cough is eliminating the non-stop cycle of cough you're stuck in long enough to let your airway heal completely and then see if there is anything still making you cough once you stop the cough suppression, but this should take no more than 5 days to figure out  First take delsym two tsp every 12 hours and supplement if needed with  tramadol 50 mg up to 2 every 4 hours to suppress the urge to cough at all or even clear your throat. Swallowing water or using ice chips/non mint and menthol containing candies (such as lifesavers or sugarless jolly ranchers) are also effective.  You should rest your voice and avoid activities that you know make you cough.  Once you have eliminated the cough for 3 straight days try reducing the tramadol first,  then the delsym as tolerated.    Prednisone 10 mg take  4 each am x 2 days,   2 each am x 2 days,  1 each am x 2 days and stop (this is to eliminate allergies and inflammation from coughing)  Protonix (pantoprazole) Take 30-60 min before first meal of the day and Pepcid 20 mg one bedtime plus chlorpheniramine 4 mg x 2 at bedtime ( available over the counter)  until cough is completely gone for at least a week without the need for cough suppression  GERD (REFLUX)  is an extremely common cause of respiratory symptoms, many times with no significant heartburn at all.    It can be treated with medication, but also with lifestyle changes including avoidance of late meals, excessive alcohol, smoking cessation, and avoid fatty foods, chocolate, peppermint, colas, red wine, and acidic juices such as orange juice.  NO MINT OR MENTHOL PRODUCTS SO NO COUGH DROPS   USE HARD CANDY INSTEAD (jolley ranchers or Stover's or Lifesavers (all available in sugarless versions) NO OIL BASED VITAMINS - use powdered substitutes.  Please remember to go to the lab   department downstairs for your tests - we will call you with the results when they are available.       For breathing >  use your duoneb as a rescue medication to be used if you can't catch your breath by resting or doing a relaxed purse lip breathing pattern.  - The less you use it, the better it will work when you need it. - Ok to use up to 2 puffs  every 4 hours if you must but call for immediate appointment if use goes up over your usual need - Don't leave home without it !!  (think of it like the spare tire for your car)     If not all better in 2 weeks return with all meds in hand - call in meantime if problems following this program

## 2016-02-22 NOTE — Progress Notes (Signed)
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 Pulmonary Consult    SOB, cough, referred by Dr [name], hospitalized in march, syncope when cough is getting worse, she passed out at work for coughing, going is so bad it strangles her, voice is affected  onset was April 2016 chronic mostly  dry cough and partially resp to pred,  still present esp after sun goes down, better with prednisone for a few weeks then back again, also tried  rx lots of different forms of albuterol/ feels choked and like bad HB at times of severe coughing fits  If not coughing not usually sob and no better with saba   No obvious day to day or daytime variability or assoc excess/ purulent sputum or mucus plugs or hemoptysis or cp or chest tightness, subjective wheeze or overt sinus symptoms. No unusual exp hx or h/o childhood pna/ asthma or knowledge of premature birth.  Sleeping ok without nocturnal  or early am exacerbation  of respiratory  c/o's or need for noct saba. Also denies any obvious fluctuation of symptoms with weather or environmental changes or other aggravating or alleviating factors except as outlined above   Current Medications, Allergies, Complete Past Medical History, Past Surgical History, Family History, and Social History were reviewed in [employer] record.             Review of Systems  HENT: Positive for congestion, ear pain, postnasal drip, rhinorrhea, sinus pressure, sneezing, sore throat and voice change.     Respiratory: Positive for cough and shortness of breath.   Cardiovascular: Positive for leg swelling.  Musculoskeletal: Positive for arthralgias.  Neurological: Negative for tremors, syncope and headaches.  Hematological: Does not bruise/bleed easily.       Objective:   Physical Exam  amb obese bf nad with freq throat clearing   Wt Readings from Last 3 Encounters:  02/22/16 (!) 372 lb 3.2 oz (168.8 kg)  02/07/16 (!) 365 lb (165.6 kg)  01/07/16 (!) 376 lb (170.6 kg)    Vital signs reviewed    HEENT: nl dentition, turbinates, and oropharynx. Nl external ear canals without cough reflex   NECK :  without JVD/Nodes/TM/ nl carotid upstrokes bilaterally   LUNGS: no acc muscle use,  Nl contour chest which is clear to A and P bilaterally without cough on insp or exp maneuvers   CV:  RRR  no s3 or murmur or increase in P2, nad no edema   ABD:  soft and nontender with nl inspiratory excursion in the supine position. No bruits or organomegaly appreciated, bowel sounds nl  MS:  Nl gait/ ext warm without deformities, calf tenderness, cyanosis or clubbing No obvious joint restrictions   SKIN: warm and dry without lesions    NEURO:  alert, approp, nl sensorium with  no motor or cerebellar deficits apparent.      I personally reviewed images and agree with radiology impression as follows:  CXR:   02/07/16  There is no active cardiopulmonary disease.  Labs ordered 02/22/2016  Allergy profile      Assessment & Plan:

## 2016-02-23 NOTE — Assessment & Plan Note (Signed)
Allergy profile 02/22/2016 >  Eos 0.1 /  IgE  pending - trial off pulmocort 02/22/2016 >>>  The most common causes of chronic cough in immunocompetent adults include the following: upper airway cough syndrome (UACS), previously referred to as postnasal drip syndrome (PNDS), which is caused by variety of rhinosinus conditions; (2) asthma; (3) GERD; (4) chronic bronchitis from cigarette smoking or other inhaled environmental irritants; (5) nonasthmatic eosinophilic bronchitis; and (6) bronchiectasis.   These conditions, singly or in combination, have accounted for up to 94% of the causes of chronic cough in prospective studies.   Other conditions have constituted no >6% of the causes in prospective studies These have included bronchogenic carcinoma, chronic interstitial pneumonia, sarcoidosis, left ventricular failure, ACEI-induced cough, and aspiration from a condition associated with pharyngeal dysfunction.    Chronic cough is often simultaneously caused by more than one condition. A single cause has been found from 38 to 82% of the time, multiple causes from 18 to 62%. Multiply caused cough has been the result of three diseases up to 42% of the time.       Most likely this is not asthma at all but rather Upper airway cough syndrome (previously labeled PNDS) , is  so named because it's frequently impossible to sort out how much is  CR/sinusitis with freq throat clearing (which can be related to primary GERD)   vs  causing  secondary (" extra esophageal")  GERD from wide swings in gastric pressure that occur with throat clearing, often  promoting self use of mint and menthol lozenges that reduce the lower esophageal sphincter tone and exacerbate the problem further in a cyclical fashion.   These are the same pts (now being labeled as having "irritable larynx syndrome" by some cough centers) who not infrequently have a history of having failed to tolerate ace inhibitors(as was the case here),  dry  powder inhalers( as is probably the case here) or biphosphonates or report having atypical/extraesophageal reflux symptoms that don't respond to standard doses of PPI (as is also likely the case here)   and are easily confused as having aecopd or asthma flares by even experienced allergists/ pulmonologists (myself included).   In addition, Of the three most common causes of chronic cough, only one (GERD)  can actually cause the other two (asthma and post nasal drip syndrome)  and perpetuate the cylce of cough inducing airway trauma, inflammation, heightened sensitivity to reflux which is prompted by the cough itself via a cyclical mechanism.    This may partially respond to steroids and look like asthma and post nasal drainage but never erradicated completely unless the cough and the secondary reflux are eliminated, preferably both at the same time.  While not intuitively obvious, many patients with chronic low grade reflux do not cough until there is a secondary insult that disturbs the protective epithelial barrier and exposes sensitive nerve endings.  This can be viral or direct physical injury such as with an endotracheal tube.   The point is that once this occurs, it is difficult to eliminate using anything but a maximally effective acid suppression regimen at least in the short run, accompanied by an appropriate diet to address non acid GERD.   For now will focus on max rx for gerd/ try off ICS in all forms and just use saba neb prn, and eliminate cyclical coughing with high dose tramadol in short term only then regroup in 2 weeks - with all meds in hand using a trust but  verify approach to confirm accurate Medication  Reconciliation The principal here is that until we are certain that the  patients are doing what we've asked, it makes no sense to ask them to do more, using the standard chronic cough algorithm   Total time devoted to counseling  > 50 % of 16 m office visit:  review case with  pt/husband discussion of options/alternatives/ personally creating written customized instructions  in presence of pt  then going over those specific  Instructions directly with the pt including how to use all of the meds but in particular covering each new medication in detail and the difference between the maintenance/automatic meds and the prns using an action plan format for the latter.  Please see AVS from this visit for a full list of these instructions

## 2016-02-27 LAB — RESPIRATORY ALLERGY PROFILE REGION II ~~LOC~~
ALLERGEN, D PTERNOYSSINUS, D1: 0.3 kU/L — AB
Allergen, A. alternata, m6: <0.1 kU/L
Allergen, C. Herbarum, M2: <0.1 kU/L
Allergen, Cedar tree, t12: <0.1 kU/L
Allergen, Comm Silver Birch, t9: <0.1 kU/L
Allergen, Mouse Urine Protein, e78: <0.1 kU/L
Allergen, Mulberry, t76: <0.1 kU/L
Allergen, P. notatum, m1: <0.1 kU/L
Aspergillus fumigatus, m3: <0.1 kU/L
Cockroach: <0.1 kU/L
Common Ragweed: <0.1 kU/L
D. FARINAE: 0.2 kU/L — AB
DOG DANDER: 0.47 kU/L — AB
IGE (IMMUNOGLOBULIN E), SERUM: 41 kU/L (ref <115)
Johnson Grass: <0.1 kU/L
Pecan/Hickory Tree IgE: <0.1 kU/L
Rough Pigweed  IgE: <0.1 kU/L
Timothy Grass: <0.1 kU/L

## 2016-02-27 NOTE — Progress Notes (Signed)
ATC, NA and no option to leave msg 

## 2016-02-28 NOTE — Progress Notes (Signed)
Spoke with pt and notified of results per Dr. Wert. Pt verbalized understanding and denied any questions. 

## 2016-03-07 ENCOUNTER — Other Ambulatory Visit: Payer: Self-pay | Admitting: *Deleted

## 2016-03-07 DIAGNOSIS — F411 Generalized anxiety disorder: Secondary | ICD-10-CM

## 2016-03-07 NOTE — Telephone Encounter (Signed)
Patient made an appointment to follow up on her medication on 03/26/16 with PCP.  Patient will be due for her ativan refill on 03/12/16 and would like to know if provider can write a script to get enough medication to last until her appointment.  Will forward to MD to advise. Rayette Mogg,CMA

## 2016-03-12 NOTE — Telephone Encounter (Signed)
3 month supply of Ativan refilled on 01/07/16 per chart review.  Patient should not run out until end of January 2018. She may have another refill at the pharmacy that she doesn't know about.  Please let patient know that early refills will not be given.  Virginia Crews, MD, MPH PGY-3,  [facility] 03/12/2016 8:18 AM

## 2016-03-12 NOTE — Telephone Encounter (Signed)
Spoke with patient, she called pharmacy and they informed her that she did have one refill still available, so patient stated she no longer needed the refill requested. She also stated that she would be calling back to reschedule her appointment, was not ready to do so during phone call. Nat Christen, CMA

## 2016-03-21 ENCOUNTER — Ambulatory Visit: Payer: Self-pay | Admitting: Internal Medicine

## 2016-03-26 ENCOUNTER — Ambulatory Visit: Payer: Self-pay | Admitting: Family Medicine

## 2016-04-10 ENCOUNTER — Ambulatory Visit (INDEPENDENT_AMBULATORY_CARE_PROVIDER_SITE_OTHER): Payer: BC Managed Care – PPO | Admitting: Family Medicine

## 2016-04-10 ENCOUNTER — Encounter: Payer: Self-pay | Admitting: Family Medicine

## 2016-04-10 VITALS — BP 118/64 | HR 115 | Temp 98.2°F | Wt 380.0 lb

## 2016-04-10 DIAGNOSIS — J45901 Unspecified asthma with (acute) exacerbation: Secondary | ICD-10-CM | POA: Diagnosis not present

## 2016-04-10 DIAGNOSIS — R05 Cough: Secondary | ICD-10-CM

## 2016-04-10 DIAGNOSIS — R059 Cough, unspecified: Secondary | ICD-10-CM

## 2016-04-10 MED ORDER — IPRATROPIUM-ALBUTEROL 0.5-2.5 (3) MG/3ML IN SOLN: 3.0000 mL | Freq: Three times a day (TID) | RESPIRATORY_TRACT | 0 refills | Status: DC | PRN

## 2016-04-10 MED ORDER — LEVOFLOXACIN 750 MG PO TABS: 750.0000 mg | ORAL_TABLET | Freq: Every day | ORAL | 0 refills | Status: DC

## 2016-04-10 MED ORDER — ALBUTEROL SULFATE (2.5 MG/3ML) 0.083% IN NEBU
2.5000 mg | INHALATION_SOLUTION | Freq: Once | RESPIRATORY_TRACT | Status: AC
  Administered 2016-04-10: 2.5 mg via RESPIRATORY_TRACT

## 2016-04-10 MED ORDER — PREDNISONE 50 MG PO TABS: 50.0000 mg | ORAL_TABLET | Freq: Every day | ORAL | 0 refills | Status: DC

## 2016-04-10 NOTE — Assessment & Plan Note (Signed)
Patient is here with cough and shortness of breath. Etiology likely asthma exacerbation. Patient is a nonsmoker however she has been living with a smoker, her husband, for many years. Patient has been experiencing increased sputum production. She regular takes naproxen which may have concealed any fever she could experience. Due to patient's history of asthma and symptoms (and desire for abx) will treat similar to COPD exacerbation. - Prednisone 50 daily 5 days - Levaquin 5 days - Refill DuoNeb PRN - Patient to follow-up on Tuesday with PCP (this had already been scheduled)

## 2016-04-10 NOTE — Patient Instructions (Signed)
It was a pleasure seeing you today in our clinic. Today we discussed your shortness of breath. Here is the treatment plan we have discussed and agreed upon together:   - I prescribed to prednisone. Take 1 tablet daily for the next 5 days - I prescribed to levofloxacin. Take 1 tablet daily for the next 5 days - I refilled your DuoNeb nebulizer. Take this medication as needed for your shortness of breath up to 4 times a day. - Make sure to keep the visit that you have with Dr. B next week.

## 2016-04-10 NOTE — Progress Notes (Signed)
COUGH Has been having these issues off and on for the past few months. Most recently started on Monday. Having a lot of SOB and coughing. Some sputum.   Has been coughing for 4 days >> but off and on for the past few months Cough is: mostly dry, some sputum Sputum production: mild Medications tried: DuoNeb Taking blood pressure medications: yes, no ACE/ARB  Symptoms Runny nose: no Mucous in back of throat: no Throat burning or reflux: no Wheezing or asthma: yes Fever: no Chest Pain: with cough Shortness of breath: yes Leg swelling: no Hemoptysis: no Weight loss: no  ROS see HPI Smoking Status noted   CC, SH/smoking status, and VS noted  Objective: BP 118/64   Pulse (!) 115   Temp 98.2 F (36.8 C)   Wt (!) 380 lb (172.4 kg)   LMP 01/09/2016   SpO2 99%   BMI 53.00 kg/m  Gen: NAD, alert, cooperative. CV: Well-perfused. Resp: Poor airflow, diffuse bilateral wheezes R>L, no crackles. Nonlabored Neuro: Sensation intact throughout.   Assessment and plan:  Cough Patient is here with cough and shortness of breath. Etiology likely asthma exacerbation. Patient is a nonsmoker however she has been living with a smoker, her husband, for many years. Patient has been experiencing increased sputum production. She regular takes naproxen which may have concealed any fever she could experience. Due to patient's history of asthma and symptoms (and desire for abx) will treat similar to COPD exacerbation. - Prednisone 50 daily 5 days - Levaquin 5 days - Refill DuoNeb PRN - Patient to follow-up on Tuesday with PCP (this had already been scheduled)   Meds ordered this encounter  Medications  . albuterol (PROVENTIL) (2.5 MG/3ML) 0.083% nebulizer solution 2.5 mg  . ipratropium-albuterol (DUONEB) 0.5-2.5 (3) MG/3ML SOLN    Sig: Take 3 mLs by nebulization 3 (three) times daily as needed.    Dispense:  360 mL    Refill:  0  . predniSONE (DELTASONE) 50 MG tablet    Sig: Take 1 tablet  (50 mg total) by mouth daily.    Dispense:  5 tablet    Refill:  0  . levofloxacin (LEVAQUIN) 750 MG tablet    Sig: Take 1 tablet (750 mg total) by mouth daily.    Dispense:  5 tablet    Refill:  0     Elberta Leatherwood, MD,MS,  PGY3 04/10/2016 1:31 PM

## 2016-04-15 ENCOUNTER — Ambulatory Visit (INDEPENDENT_AMBULATORY_CARE_PROVIDER_SITE_OTHER): Payer: BC Managed Care – PPO | Admitting: Family Medicine

## 2016-04-15 ENCOUNTER — Encounter: Payer: Self-pay | Admitting: Family Medicine

## 2016-04-15 VITALS — BP 156/80 | HR 80 | Temp 98.7°F | Ht 71.0 in | Wt 384.8 lb

## 2016-04-15 DIAGNOSIS — I10 Essential (primary) hypertension: Secondary | ICD-10-CM | POA: Diagnosis not present

## 2016-04-15 DIAGNOSIS — J45901 Unspecified asthma with (acute) exacerbation: Secondary | ICD-10-CM

## 2016-04-15 DIAGNOSIS — F411 Generalized anxiety disorder: Secondary | ICD-10-CM

## 2016-04-15 MED ORDER — PREDNISONE 50 MG PO TABS: 50.0000 mg | ORAL_TABLET | Freq: Every day | ORAL | 0 refills | Status: DC

## 2016-04-15 MED ORDER — LORAZEPAM 1 MG PO TABS: 1.0000 mg | ORAL_TABLET | Freq: Three times a day (TID) | ORAL | 0 refills | Status: DC | PRN

## 2016-04-15 NOTE — Progress Notes (Signed)
   Subjective:   [name] is a 46 y.o. female with a history of HTN, T2 DM, hypothyroidism, MDD, anxiety, morbid obesity here for med refills  Anxiety - uses ativan regularly TID - is helping symptoms - also taking buspar, clonidine, prozac, trazodone - from [place] - appt tomorrow - denies SI/HI  - starting with therapy this month  Cough, SOB Difficult to sleep Cough and SOB x8d Seen at [facility] 2/1 - treated for possioble COPD exacerbation Finished prednisone and levaquin yesterday Feels no better Taking delsyum for cough Using Duoneb Using sugar-free candy as lozenge Having to sit and rest while getting dressed in the AM Told by Pulm that this was likely upper airway cougfh syndrome Taking pepcid BID and protonix daily for GERD   Review of Systems:  Per HPI.   Social History: Never smoker  Objective:  BP (!) 156/80   Pulse 80   Temp 98.7 F (37.1 C) (Oral)   Ht 5\' 11"  (1.803 m)   Wt (!) 384 lb 12.8 oz (174.5 kg)   BMI 53.67 kg/m   Gen:  46 y.o. female , appears uncomfortable, NAD HEENT: NCAT, MMM, EOMI, PERRL, anicteric sclerae, OP clear CV: RRR, no MRG Resp: Non-labored, deep and hacking cough intermittently, diffuse wheezing, good air movement Abd: Soft, NTND Ext: WWP, trace edema MSK: no obvious deformities, gait intact Neuro: Alert and oriented, speech normal      Chemistry      Component Value Date/Time   NA 136 02/07/2016 1105   K 4.6 02/07/2016 1105   CL 104 02/07/2016 1105   CO2 26 02/07/2016 1105   BUN 13 02/07/2016 1105   CREATININE 1.40 (H) 02/07/2016 1105   CREATININE 1.32 (H) 04/10/2015 1656      Component Value Date/Time   CALCIUM 8.9 02/07/2016 1105   ALKPHOS 34 (L) 05/09/2015 1432   AST 17 05/09/2015 1432   ALT 12 (L) 05/09/2015 1432   BILITOT 0.4 05/09/2015 1432      Lab Results  Component Value Date   WBC 8.4 02/22/2016   HGB 10.4 (L) 02/22/2016   HCT 32.9 (L) 02/22/2016   MCV 78.5 02/22/2016   PLT 513.0 (H) 02/22/2016     Lab Results  Component Value Date   TSH 3.39 08/28/2015   Lab Results  Component Value Date   HGBA1C 7.1 11/27/2015   Assessment & Plan:     [name] is a 46 y.o. female here for   Anxiety state Ativan refilled, 3 months supply Doing well Encourage patient about the synergistic effects of medications with therapy Follow-up in 3 months  Asthma with acute exacerbation Poorly controlled With likely acute exacerbation 7 more days of prednisone daily Continue DuoNeb's as needed Advised pharmacy clinic for PFT testing Though pulmonology was concern for upper airway cough syndrome, given diffuse wheezing, suspect asthma is more likely No smoking history to suggest COPD Could consider alpha-1 antitrypsin testing given odd presentation and late onset asthma-like symptoms Will likely need controller medication after diagnosis with PFTs      Virginia Crews, MD MPH PGY-3,  [facility] 04/16/2016  9:22 PM

## 2016-04-15 NOTE — Patient Instructions (Signed)
Nice to see you again today. You can stop taking the medicine for reflux - Pepcid and Protonix.  We will put you back on one week's worth of steroids daily. This should clear up some of the wheezing which will help the coughing over time. We will have the schedule an appointment with Dr. Valentina Lucks in our clinic to repeat your pulmonary function tests to further clarify her diagnosis. You can use your nebulizers as needed.  Follow-up with me in one month for your blood pressure and diabetes and thyroid problems.  Take care,  Dr. Jacinto Reap

## 2016-04-16 NOTE — Assessment & Plan Note (Signed)
>>  ASSESSMENT AND PLAN FOR ANXIETY WRITTEN ON 04/16/2016  9:21 PM BY Erasmo Downer, MD  Ativan refilled, 3 months supply Doing well Encourage patient about the synergistic effects of medications with therapy Follow-up in 3 months

## 2016-04-16 NOTE — Assessment & Plan Note (Addendum)
Poorly controlled With likely acute exacerbation 7 more days of prednisone daily Continue DuoNeb's as needed [facility] for PFT testing Though pulmonology was concern for upper airway cough syndrome, given diffuse wheezing, suspect asthma is more likely No smoking history to suggest COPD Could consider alpha-1 antitrypsin testing given odd presentation and late onset asthma-like symptoms Will likely need controller medication after diagnosis with PFTs

## 2016-04-16 NOTE — Assessment & Plan Note (Signed)
Ativan refilled, 3 months supply Doing well Encourage patient about the synergistic effects of medications with therapy Follow-up in 3 months

## 2016-04-17 ENCOUNTER — Ambulatory Visit: Payer: Self-pay | Admitting: Pharmacist

## 2016-04-18 ENCOUNTER — Telehealth: Payer: Self-pay | Admitting: Family Medicine

## 2016-04-18 ENCOUNTER — Encounter: Payer: Self-pay | Admitting: Family Medicine

## 2016-04-18 NOTE — Telephone Encounter (Signed)
Needs dr note to cover work absences from 04-16-16 thru today.   Pt will pick it up. Please let her let her know when it is ready

## 2016-04-18 NOTE — Telephone Encounter (Signed)
Left message on voicemail informing that letter was ready to be picked up.

## 2016-04-18 NOTE — Telephone Encounter (Signed)
Letter completed and routed to [employer].  Please print and let patient know it is available to pick up.  Thanks!  Virginia Crews, MD, MPH PGY-3,  [facility] 04/18/2016 10:49 AM

## 2016-04-21 ENCOUNTER — Other Ambulatory Visit: Payer: Self-pay

## 2016-04-21 ENCOUNTER — Emergency Department (HOSPITAL_COMMUNITY)
Admission: EM | Admit: 2016-04-21 | Discharge: 2016-04-21 | Disposition: A | Discharge status: Home / Self Care | Payer: BC Managed Care – PPO | Attending: Physician Assistant | Admitting: Physician Assistant

## 2016-04-21 ENCOUNTER — Emergency Department (HOSPITAL_COMMUNITY): Payer: BC Managed Care – PPO

## 2016-04-21 ENCOUNTER — Encounter (HOSPITAL_COMMUNITY): Payer: Self-pay | Admitting: Emergency Medicine

## 2016-04-21 DIAGNOSIS — R058 Other specified cough: Secondary | ICD-10-CM

## 2016-04-21 DIAGNOSIS — Z79899 Other long term (current) drug therapy: Secondary | ICD-10-CM | POA: Insufficient documentation

## 2016-04-21 DIAGNOSIS — Z91040 Latex allergy status: Secondary | ICD-10-CM | POA: Diagnosis not present

## 2016-04-21 DIAGNOSIS — Z7982 Long term (current) use of aspirin: Secondary | ICD-10-CM | POA: Diagnosis not present

## 2016-04-21 DIAGNOSIS — E1165 Type 2 diabetes mellitus with hyperglycemia: Secondary | ICD-10-CM | POA: Insufficient documentation

## 2016-04-21 DIAGNOSIS — Z7984 Long term (current) use of oral hypoglycemic drugs: Secondary | ICD-10-CM | POA: Diagnosis not present

## 2016-04-21 DIAGNOSIS — E1122 Type 2 diabetes mellitus with diabetic chronic kidney disease: Secondary | ICD-10-CM | POA: Insufficient documentation

## 2016-04-21 DIAGNOSIS — J45909 Unspecified asthma, uncomplicated: Secondary | ICD-10-CM | POA: Diagnosis not present

## 2016-04-21 DIAGNOSIS — R05 Cough: Secondary | ICD-10-CM

## 2016-04-21 DIAGNOSIS — R739 Hyperglycemia, unspecified: Secondary | ICD-10-CM

## 2016-04-21 DIAGNOSIS — I129 Hypertensive chronic kidney disease with stage 1 through stage 4 chronic kidney disease, or unspecified chronic kidney disease: Secondary | ICD-10-CM | POA: Diagnosis not present

## 2016-04-21 DIAGNOSIS — N183 Chronic kidney disease, stage 3 (moderate): Secondary | ICD-10-CM | POA: Diagnosis not present

## 2016-04-21 DIAGNOSIS — R55 Syncope and collapse: Secondary | ICD-10-CM | POA: Diagnosis present

## 2016-04-21 LAB — I-STAT CHEM 8, ED
BUN: 16 mg/dL (ref 6–20)
CALCIUM ION: 1.08 mmol/L — AB (ref 1.15–1.40)
CHLORIDE: 102 mmol/L (ref 101–111)
Creatinine, Ser: 1.3 mg/dL — ABNORMAL HIGH (ref 0.44–1.00)
Glucose, Bld: 229 mg/dL — ABNORMAL HIGH (ref 65–99)
HCT: 34 % — ABNORMAL LOW (ref 36.0–46.0)
Hemoglobin: 11.6 g/dL — ABNORMAL LOW (ref 12.0–15.0)
Potassium: 4.3 mmol/L (ref 3.5–5.1)
SODIUM: 138 mmol/L (ref 135–145)
TCO2: 25 mmol/L (ref 0–100)

## 2016-04-21 LAB — CBC WITH DIFFERENTIAL/PLATELET
BASOS PCT: 0 %
Basophils Absolute: 0 10*3/uL (ref 0.0–0.1)
EOS ABS: 0.2 10*3/uL (ref 0.0–0.7)
Eosinophils Relative: 2 %
HCT: 33.3 % — ABNORMAL LOW (ref 36.0–46.0)
HEMOGLOBIN: 10 g/dL — AB (ref 12.0–15.0)
LYMPHS ABS: 2.8 10*3/uL (ref 0.7–4.0)
Lymphocytes Relative: 33 %
MCH: 24.3 pg — AB (ref 26.0–34.0)
MCHC: 30 g/dL (ref 30.0–36.0)
MCV: 80.8 fL (ref 78.0–100.0)
Monocytes Absolute: 0.5 10*3/uL (ref 0.1–1.0)
Monocytes Relative: 6 %
NEUTROS PCT: 59 %
Neutro Abs: 4.9 10*3/uL (ref 1.7–7.7)
Platelets: 419 10*3/uL — ABNORMAL HIGH (ref 150–400)
RBC: 4.12 MIL/uL (ref 3.87–5.11)
RDW: 15.6 % — ABNORMAL HIGH (ref 11.5–15.5)
WBC: 8.4 10*3/uL (ref 4.0–10.5)

## 2016-04-21 LAB — I-STAT BETA HCG BLOOD, ED (MC, WL, AP ONLY): I-stat hCG, quantitative: <5 m[IU]/mL (ref <5)

## 2016-04-21 MED ORDER — HYDROCOD POLST-CPM POLST ER 10-8 MG/5ML PO SUER
5.0000 mL | Freq: Once | ORAL | Status: AC
  Administered 2016-04-21: 5 mL via ORAL
  Filled 2016-04-21: qty 5

## 2016-04-21 MED ORDER — PREDNISONE 10 MG (21) PO TBPK: 10.0000 mg | ORAL_TABLET | Freq: Every day | ORAL | 1 refills | Status: DC

## 2016-04-21 MED ORDER — BENZONATATE 100 MG PO CAPS: 100.0000 mg | ORAL_CAPSULE | Freq: Three times a day (TID) | ORAL | 0 refills | Status: DC

## 2016-04-21 NOTE — ED Notes (Signed)
Attempted blood draw. Will let Levada Dy, RN aware

## 2016-04-21 NOTE — Discharge Instructions (Signed)
Continue breathing treatments at home. Prednisone as prescribed until all gone. Tessalon for cough as needed. Follow up with Dr. Melvyn Novas, or primary care doctor.

## 2016-04-21 NOTE — ED Triage Notes (Addendum)
Pt from work as a Therapist, sports with c/o witnessed episode of a strong cough, then syncope, and falling backwards, hitting the back of her head on the table and then floor.  Pt additionally reports lumbar pain and possible worsening right ankle pain with recent surgery.  No deformity noted, no blood noted.  Pt reports hx of syncope s/p coughing. NAD, A&O.

## 2016-04-21 NOTE — ED Provider Notes (Signed)
[facility] DEPT Provider Note   CSN: [id number] Arrival date & time: 04/21/16  1017     History   Chief Complaint Chief Complaint  Patient presents with  . Loss of Consciousness  . Fall  . Cough    HPI [name] is a 46 y.o. female.  HPI [name] is a 46 y.o. female with hx of DM, HTN, recurrent cough, presents to ED with complaint of post tussive syncope. Pt states she has been coughing for 3 weeks. States this is a recurrent problem. States she gets "coughing spells" which end in syncope. States she has about 3 fainting episodes a week. 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 No other URI symptoms. Has seen pulmonologist who thought she may have had GERD. Currently taking pepcid. Her PCP suggested COPD. She is currently taking inhaler and neb treatments at home. She is taking delsym for cough. She finished steroid taper 1 week ago. She states sometimes she also has hoarseness in her voice. Reports some back pain from the fall today. Pt states she was told that she had "some shaking and my eyes rolled back." no postictal state.   Past Medical History:  Diagnosis Date  . Acute renal failure ([place]) 05/27/10   hemodialysis for 6 weeks  . Anxiety   . Depression   . Diabetes mellitus   . Hypertension   . Rhabdomyolysis 06/06/10   after drug overdose  . Suicide attempt by drug ingestion ([place]) 06/05/10   result rhabdomyolosis and ARF requrining dialysis     Patient Active Problem List   Diagnosis Date Noted  . Upper airway cough syndrome 02/22/2016  . MDD (major depressive disorder), recurrent severe, without psychosis ([place]) 05/11/2015  . Dyspnea 05/09/2015  . Essential hypertension   . Vitamin D deficiency 04/11/2015  . Fatigue 04/10/2015  . Non compliance w medication regimen 04/10/2015  . Sore throat 02/08/2015  . Golfer's elbow 12/22/2014  . Type 2 diabetes  mellitus with complication ([place])   . Thyroid activity decreased   . Adjustment disorder with mixed anxiety and depressed mood   . Cough 09/20/2014  . Asthma with acute exacerbation 06/22/2014  . Grief at loss of child 06/22/2014  . Dysmenorrhea 11/25/2013  . Frequent urination 10/20/2012  . Furuncle 07/07/2012  . Chronic kidney disease (CKD), stage III (moderate) 11/06/2010  . ENDOMETRIAL HYPERPLASIA UNSPECIFIED 02/07/2008  . MENORRHAGIA 01/03/2008  . Allergic rhinitis 06/24/2007  . Essential hypertension, benign 02/03/2007  . Hypothyroidism 05/07/2006  . Type II diabetes mellitus with complication ([place]) 02/40/9735  . Morbid obesity ([place]) 05/07/2006  . Iron deficiency anemia 05/07/2006  . Major depressive disorder, recurrent episode ([place]) 05/07/2006  . Anxiety state 05/07/2006  . OBSESSIVE COMPUL. DISORDER 05/07/2006    Past Surgical History:  Procedure Laterality Date  . ANKLE ARTHROSCOPY Right 08/17/2015   Procedure: RIGHT ANKLE ARTHROSCOPY WITH SYNOVECTOMY AND LOOSE BODY EXCISION;  Surgeon: Melrose Nakayama, MD;  Location: [facility];  Service: Orthopedics;  Laterality: Right;  . CHOLECYSTECTOMY    . TUBAL LIGATION  1998    OB History    No data available       Home Medications    Prior to Admission medications   Medication Sig Start Date End Date Taking? Authorizing Provider  acetaminophen (TYLENOL) 500 MG tablet Take 1,000 mg by mouth every 6 (six) hours as needed for moderate pain.    Historical  Provider, MD  acetaminophen-codeine (TYLENOL #3) 300-30 MG tablet One every 4 hours as needed for cough 02/22/16   Tanda Rockers, MD  amLODipine (NORVASC) 10 MG tablet Take 1 tablet (10 mg total) by mouth daily. 01/07/16   Virginia Crews, MD  aspirin EC 81 MG tablet Take 81 mg by mouth daily.    Historical Provider, MD  busPIRone (BUSPAR) 10 MG tablet Take 10 mg by mouth 2 (two) times daily.    Historical Provider, MD  dextromethorphan (DELSYM) 30 MG/5ML liquid Take 60 mg by  mouth as needed for cough.    Historical Provider, MD  docusate sodium (COLACE) 100 MG capsule Take 200 mg by mouth at bedtime.    Historical Provider, MD  famotidine (PEPCID) 20 MG tablet One at bedtime 02/22/16   Tanda Rockers, MD  ferrous sulfate 325 (65 FE) MG tablet Take 1 tablet (325 mg total) by mouth 3 (three) times daily with meals. Patient taking differently: Take 325 mg by mouth daily with breakfast.  04/10/15   Virginia Crews, MD  FLUoxetine (PROZAC) 40 MG capsule Take 80 mg by mouth daily.     Historical Provider, MD  fluticasone (FLONASE) 50 MCG/ACT nasal spray Place 2 sprays into both nostrils daily. Patient taking differently: Place 2 sprays into both nostrils daily as needed for allergies.  09/24/14   Virginia Crews, MD  glimepiride (AMARYL) 2 MG tablet Take 2 tablets (4 mg total) by mouth daily before breakfast. 01/07/16   Virginia Crews, MD  ipratropium-albuterol (DUONEB) 0.5-2.5 (3) MG/3ML SOLN Take 3 mLs by nebulization 3 (three) times daily as needed. 04/10/16   Elberta Leatherwood, MD  levothyroxine (SYNTHROID, LEVOTHROID) 300 MCG tablet Take 1 tablet (300 mcg total) by mouth daily. 01/07/16   Virginia Crews, MD  LORazepam (ATIVAN) 1 MG tablet Take 1 tablet (1 mg total) by mouth every 8 (eight) hours as needed for anxiety. Do not fill <30 days from last refill 04/15/16   Virginia Crews, MD  LORazepam (ATIVAN) 1 MG tablet Take 1 tablet (1 mg total) by mouth every 8 (eight) hours as needed for anxiety. Do not fill <30 days from last refill 04/15/16   Virginia Crews, MD  LORazepam (ATIVAN) 1 MG tablet Take 1 tablet (1 mg total) by mouth 3 (three) times daily as needed for anxiety. Do not fill before 30 days after last refill. 04/15/16   Virginia Crews, MD  pantoprazole (PROTONIX) 40 MG tablet Take 1 tablet (40 mg total) by mouth daily. Take 30-60 min before first meal of the day 02/22/16   Tanda Rockers, MD  predniSONE (DELTASONE) 50 MG tablet Take 1 tablet (50  mg total) by mouth daily. 04/15/16   Virginia Crews, MD  traZODone (DESYREL) 100 MG tablet Take 150 mg by mouth at bedtime.  02/26/13   Historical Provider, MD  Vitamin D, Ergocalciferol, (DRISDOL) 50000 units CAPS capsule Take 1 capsule (50,000 Units total) by mouth every 7 (seven) days. 04/11/15   Virginia Crews, MD    Family History Family History  Problem Relation Age of Onset  . Asthma Father     Social History Social History  Substance Use Topics  . Smoking status: Never Smoker  . Smokeless tobacco: Never Used  . Alcohol use No     Allergies   Ace inhibitors; Haldol [haloperidol lactate]; Metformin and related; Nsaids; and Latex   Review of Systems Review of Systems  Constitutional: Negative  for chills and fever.  Respiratory: Positive for cough. Negative for chest tightness and shortness of breath.   Cardiovascular: Negative for chest pain, palpitations and leg swelling.  Gastrointestinal: Negative for abdominal pain, diarrhea, nausea and vomiting.  Genitourinary: Negative for dysuria, flank pain, pelvic pain, vaginal bleeding, vaginal discharge and vaginal pain.  Musculoskeletal: Negative for arthralgias, myalgias, neck pain and neck stiffness.  Skin: Negative for rash.  Neurological: Positive for syncope. Negative for dizziness, weakness and headaches.  All other systems reviewed and are negative.    Physical Exam Updated Vital Signs BP 126/67 (BP Location: Right Wrist)   Pulse 86   Temp 98.5 F (36.9 C) (Oral)   Resp 17   Ht 5\' 11"  (1.803 m)   Wt (!) 174.2 kg   SpO2 95%   BMI 53.56 kg/m   Physical Exam  Constitutional: She appears well-developed and well-nourished. No distress.  HENT:  Head: Normocephalic.  Eyes: Conjunctivae are normal.  Neck: Neck supple.  Cardiovascular: Normal rate, regular rhythm and normal heart sounds.   Pulmonary/Chest: Breath sounds normal. She is in respiratory distress. She has no wheezes. She has no rales.    Abdominal: Soft. Bowel sounds are normal. She exhibits no distension. There is no tenderness. There is no rebound.  Musculoskeletal: She exhibits no edema.  Neurological: She is alert.  Skin: Skin is warm and dry.  Psychiatric: She has a normal mood and affect. Her behavior is normal.  Nursing note and vitals reviewed.    ED Treatments / Results  Labs (all labs ordered are listed, but only abnormal results are displayed) Labs Reviewed  CBC WITH DIFFERENTIAL/PLATELET - Abnormal; Notable for the following:       Result Value   Hemoglobin 10.0 (*)    HCT 33.3 (*)    MCH 24.3 (*)    RDW 15.6 (*)    Platelets 419 (*)    All other components within normal limits  I-STAT CHEM 8, ED - Abnormal; Notable for the following:    Creatinine, Ser 1.30 (*)    Glucose, Bld 229 (*)    Calcium, Ion 1.08 (*)    Hemoglobin 11.6 (*)    HCT 34.0 (*)    All other components within normal limits  I-STAT BETA HCG BLOOD, ED ([facility], [facility], [facility] ONLY)    EKG  EKG Interpretation  Date/Time:  Monday April 21 2016 10:25:36 EST Ventricular Rate:  92 PR Interval:    QRS Duration: 93 QT Interval:  389 QTC Calculation: 482 R Axis:   24 Text Interpretation:  Sinus rhythm Normal sinus rhythm Confirmed by Thomasene Lot, COURTNEY ([id number]) on 04/21/2016 10:35:03 AM       Radiology No results found.  Procedures Procedures (including critical care time)  Medications Ordered in ED Medications  chlorpheniramine-HYDROcodone (TUSSIONEX) 10-8 MG/5ML suspension 5 mL (not administered)     Initial Impression / Assessment and Plan / ED Course  I have reviewed the triage vital signs and the nursing notes.  Pertinent labs & imaging results that were available during my care of the patient were reviewed by me and considered in my medical decision making (see chart for details).    Pt with recurrent post tussive syncope. Suspect vagal. Complaining of back pain. Neurovascularly intact. Asking for pian meds for her  back. Will get cxr, basic labs. Will give tussionex for cough and pain  12:56 PM CXR normal. Blood work showing elevated glucose of 229, elevated creat at 1.3. Discussed with pt, will need close  outpatient follow up. Discussed with Dr. Thomasene Lot, will restart prednisone taper, continue acid reflux meds, discussed diet choices as well. Discussed idea of vocal cord dysfunction, deconditioning. Will have follow up with pulm  Vitals:   04/21/16 1025 04/21/16 1026 04/21/16 1030 04/21/16 1045  BP: 126/67 126/67 113/60 144/94  Pulse: 86 86 87 91  Resp: 21 17 16 18   Temp:  98.5 F (36.9 C)    TempSrc:  Oral    SpO2: 99% 95% 96% 99%  Weight:  (!) 174.2 kg    Height:  5\' 11"  (1.803 m)       Final Clinical Impressions(s) / ED Diagnoses   Final diagnoses:  Post-tussive syncope  Hyperglycemia    New Prescriptions New Prescriptions   BENZONATATE (TESSALON) 100 MG CAPSULE    Take 1 capsule (100 mg total) by mouth every 8 (eight) hours.   PREDNISONE (STERAPRED UNI-PAK 21 TAB) 10 MG (21) TBPK TABLET    Take 1 tablet (10 mg total) by mouth daily. Take 6 tabs by mouth daily  for 2 days, then 5 tabs for 2 days, then 4 tabs for 2 days, then 3 tabs for 2 days, 2 tabs for 2 days, then 1 tab by mouth daily for 2 days     Jeannett Senior, PA-C 04/21/16 [place], MD 04/21/16 1416

## 2016-05-05 ENCOUNTER — Telehealth: Payer: Self-pay | Admitting: Family Medicine

## 2016-05-05 NOTE — Telephone Encounter (Signed)
Called patient to see if I could get her scheduled for my [facility] clinic on 05/08/16. No voicemail set up on mobile number. Left message with family member on home number asking her to call back to the clinic.  If patient returns the call, could we try to get her scheduled for this special, long appointment in which we'll be able to deal with multiple of her medical conditions.  Virginia Crews, MD, MPH PGY-3,  [facility] 05/05/2016 9:26 AM

## 2016-05-16 ENCOUNTER — Ambulatory Visit (INDEPENDENT_AMBULATORY_CARE_PROVIDER_SITE_OTHER): Payer: BC Managed Care – PPO | Admitting: Physician Assistant

## 2016-05-16 VITALS — BP 165/95 | HR 87 | Temp 98.3°F | Resp 18 | Ht 71.0 in | Wt 378.4 lb

## 2016-05-16 DIAGNOSIS — Z9119 Patient's noncompliance with other medical treatment and regimen: Secondary | ICD-10-CM | POA: Diagnosis not present

## 2016-05-16 DIAGNOSIS — R062 Wheezing: Secondary | ICD-10-CM | POA: Diagnosis not present

## 2016-05-16 DIAGNOSIS — R05 Cough: Secondary | ICD-10-CM

## 2016-05-16 DIAGNOSIS — Z91199 Patient's noncompliance with other medical treatment and regimen due to unspecified reason: Secondary | ICD-10-CM

## 2016-05-16 DIAGNOSIS — R030 Elevated blood-pressure reading, without diagnosis of hypertension: Secondary | ICD-10-CM | POA: Diagnosis not present

## 2016-05-16 DIAGNOSIS — R059 Cough, unspecified: Secondary | ICD-10-CM

## 2016-05-16 MED ORDER — ALBUTEROL SULFATE (2.5 MG/3ML) 0.083% IN NEBU
2.5000 mg | INHALATION_SOLUTION | Freq: Once | RESPIRATORY_TRACT | Status: AC
  Administered 2016-05-16: 2.5 mg via RESPIRATORY_TRACT

## 2016-05-16 MED ORDER — IPRATROPIUM BROMIDE 0.02 % IN SOLN
0.5000 mg | Freq: Once | RESPIRATORY_TRACT | Status: AC
  Administered 2016-05-16: 0.5 mg via RESPIRATORY_TRACT

## 2016-05-16 NOTE — Progress Notes (Signed)
MRN: [id number] DOB: [birth year]  Subjective:   [name] is a 46 y.o. female presenting for chief complaint of Cough (for weeks; producing yellow sputum; states she passes out from coughing so much) . Of note, pt is a poor historian and cannot give a solid time frame as to how long this has been occurring or what all medications she has attempted or what she is currently taking for cough right now.   Pt notes the cough has been going on and off for the past few months. She has a PCP, Dr. Brita Romp, he has been following her for this condition. She has also been seen by a pulmonlogist, Dr. Melvyn Novas. And in between these visits she has visited the ED multiple times for her coughing flares.She has had three chest xrays since November 2017, all of which are normal.   States some people say she has asthma, some say COPD, and the pulmonologist said GERD so she does not know what to do because everyone keeps giving her different diagnoses and different medications to take. She has attempted multiple prednisone tapers, ICS, SABA, duonebs, antibiotics, cough suppressants, and PPIs for an unknown duration without full relief.   For this particular flare, it has lasted 3 days. She has associated sputum production, wheezing, and SOB. Denies fever, chills, hemoptysis, sinus congestion, chest pain, lower leg swelling, headache, myalgias, syncope, heartburn, belching, and heart palpitations. She does not smoke. Cannot associate any irritants responsible for her cough. Has tried cough syrup, cough drops, duonebs, and saba inhaler with no relief. States she came here because her father told her she needs to see someone else for another opinion.   For further details of visits with providers at her primary care practice and her pulmonologist, please review those notes.    [name] has a current medication list which includes the following prescription(s): amlodipine, aspirin ec, benzonatate, benztropine,  buspirone, clonidine, dextromethorphan, docusate sodium, famotidine, ferrous sulfate, fluoxetine, fluticasone, glimepiride, ipratropium-albuterol, levothyroxine, lorazepam, lorazepam, lorazepam, olanzapine, pantoprazole, prednisone, trazodone, and vitamin d (ergocalciferol). Also is allergic to ace inhibitors; haldol [haloperidol lactate]; metformin and related; nsaids; and latex.  [name]  has a past medical history of Acute renal failure ([place]) (05/27/10); Anxiety; Depression; Diabetes mellitus; Hypertension; Rhabdomyolysis (06/06/10); and Suicide attempt by drug ingestion ([place]) (06/05/10). Also  has a past surgical history that includes Tubal ligation (1998); Cholecystectomy; and Ankle arthroscopy (Right, 08/17/2015).   Objective:   Vitals: BP (!) 165/95   Pulse 87   Temp 98.3 F (36.8 C) (Oral)   Resp 18   Ht 5\' 11"  (1.803 m)   Wt (!) 378 lb 6.4 oz (171.6 kg)   SpO2 98%   BMI 52.78 kg/m   Physical Exam  Constitutional: She is oriented to person, place, and time. She appears well-developed and well-nourished.  HENT:  Head: Normocephalic and atraumatic.  Right Ear: Tympanic membrane, external ear and ear canal normal.  Left Ear: Tympanic membrane, external ear and ear canal normal.  Nose: Mucosal edema (mild) present.  Mouth/Throat: Uvula is midline, oropharynx is clear and moist and mucous membranes are normal.  Eyes: Conjunctivae are normal.  Neck: Normal range of motion.  Cardiovascular: Normal rate, regular rhythm, normal heart sounds and intact distal pulses.   Pulmonary/Chest: Effort normal. No accessory muscle usage. No tachypnea. No respiratory distress. She has wheezes (mild, diffuse throughout lung field). She has no rhonchi. She has no rales.  Lung exam difficult as patient consistently coughing during lung exam.  Neurological:  She is alert and oriented to person, place, and time.  Skin: Skin is warm and dry.  Psychiatric: She has a normal mood and affect.  Vitals  reviewed.  Pt reports no improvement with duoneb because she had to cough the whole time. Wheezing remains the same post duoneb.   No results found for this or any previous visit (from the past 24 hour(s)).  Assessment and Plan :  1. Cough 2. Wheezing -SpO2 of 98% reassuring.  - albuterol (PROVENTIL) (2.5 MG/3ML) 0.083% nebulizer solution 2.5 mg; Take 3 mLs (2.5 mg total) by nebulization once. - ipratropium (ATROVENT) nebulizer solution 0.5 mg; Take 2.5 mLs (0.5 mg total) by nebulization once 3. Patient's noncompliance with other medical treatment and regimen Extensive chart review was performed with patient for diagnosis, treatment plan, and follow up.  -Per extensive chart review and patient's history, it appears that she is highly upset and confused as to why she is having this cough due to multiple opinions from various providers. She therefore is unsure of what medications to even try for her cough at this point. Considering she has wheezing, I have discussed giving her yet another prednisone course but she informed me that she has a prescription for prednisone from her PCP waiting for her at the pharmacy. I have informed her to pick that up and take it as prescribed.I also instructed her to pick up the pantoprazole as well and start that daily along with pepcid at night as recommended by pulmonologist.  I then strongly encouraged her to choose ONE provider and follow up with them closely for this condition as getting multiple opinions is clearly not helping her gain any better control over this condition. I recommended the pulmonologist, as this is his specialty. Given ED precautions.   4. Elevated blood pressure reading -Likely due to coughing spells. Encouraged to take bp outside of office and if remaining consistently elevated,  follow up with PCP. Given ED precautions.   Tenna Delaine, PA-C  [facility] 05/16/2016 2:08 PM

## 2016-05-16 NOTE — Patient Instructions (Addendum)
Also, pick up your prednisone and take that as prescribed. Continue taking daily zyrtec. It is important that you follow up with either your PCP or pulmonologist as you need to have consistent care and follow up for this condition. If any of your symptoms worsen, please seek care sooner.    Bronchospasm, Adult Bronchospasm is when airways in the lungs get smaller. When this happens, it can be hard to breathe. You may cough. You may also make a whistling sound when you breathe (wheeze). Follow these instructions at home: Medicines   Take over-the-counter and prescription medicines only as told by your doctor.  If you need to use an inhaler or nebulizer to take your medicine, ask your doctor how to use it.  If you were given a spacer, always use it with your inhaler. Lifestyle   Change your heating and air conditioning filter. Do this at least once a month.  Try not to use fireplaces and wood stoves.  Do not  smoke. Do not  allow smoking in your home.  Try not to use things that have a strong smell, like perfume.  Get rid of pests (such as roaches and mice) and their poop.  Remove any mold from your home.  Keep your house clean. Get rid of dust.  Use cleaning products that have no smell.  Replace carpet with wood, tile, or vinyl flooring.  Use allergy-proof pillows, mattress covers, and box spring covers.  Wash bed sheets and blankets every week. Use hot water. Dry them in a dryer.  Use blankets that are made of polyester or cotton.  Wash your hands often.  Keep pets out of your bedroom.  When you exercise, try not to breathe in cold air. General instructions   Have a plan for getting medical care. Know these things:  When to call your doctor.  When to call local emergency services (911 in the [place]  Where to go in an emergency.  Stay up to date on your shots (immunizations).  When you have an episode:  Stay calm.  Relax.  Breathe slowly. Contact a  doctor if:  Your muscles ache.  Your chest hurts.  The color of the mucus you cough up (sputum) changes from clear or white to yellow, green, gray, or bloody.  The mucus you cough up gets thicker.  You have a fever. Get help right away if:  The whistling sound gets worse, even after you take your medicines.  Your coughing gets worse.  You find it even harder to breathe.  Your chest hurts very much. Summary  Bronchospasm is when airways in the lungs get smaller.  When this happens, it can be hard to breathe. You may cough. You may also make a whistling sound when you breathe.  Stay away from things that cause you to have episodes. These include smoke or dust. This information is not intended to replace advice given to you by your health care provider. Make sure you discuss any questions you have with your health care provider. Document Released: 12/22/2008 Document Revised: 02/28/2016 Document Reviewed: 02/28/2016 Elsevier Interactive Patient Education  2017 [employer].  IF you received an x-ray today, you will receive an invoice from [facility]. Please contact [facility] at [phone] with questions or concerns regarding your invoice.   IF you received labwork today, you will receive an invoice from [facility]. Please contact [facility] at [phone] with questions or concerns regarding your invoice.   Our billing staff will not be able  to assist you with questions regarding bills from these companies.  You will be contacted with the lab results as soon as they are available. The fastest way to get your results is to activate your My Chart account. Instructions are located on the last page of this paperwork. If you have not heard from [name] regarding the results in 2 weeks, please contact this office.

## 2016-06-09 ENCOUNTER — Telehealth: Payer: Self-pay | Admitting: Family Medicine

## 2016-06-09 NOTE — Telephone Encounter (Signed)
DOT/DMV form dropped off for at front desk for completion.  Verified that patient section of form has been completed.  Last DOS/WCC with PCP was 04/15/16.  Placed form in team folder to be completed by clinical staff.  Crista Luria

## 2016-06-09 NOTE — Telephone Encounter (Signed)
Form placed in PCP box 

## 2016-06-11 ENCOUNTER — Ambulatory Visit (INDEPENDENT_AMBULATORY_CARE_PROVIDER_SITE_OTHER): Payer: Self-pay | Admitting: Family Medicine

## 2016-06-11 VITALS — BP 127/89 | HR 90 | Temp 98.6°F | Resp 18 | Ht 71.0 in | Wt 379.2 lb

## 2016-06-11 DIAGNOSIS — I10 Essential (primary) hypertension: Secondary | ICD-10-CM

## 2016-06-11 DIAGNOSIS — F4323 Adjustment disorder with mixed anxiety and depressed mood: Secondary | ICD-10-CM

## 2016-06-11 DIAGNOSIS — R55 Syncope and collapse: Secondary | ICD-10-CM

## 2016-06-11 DIAGNOSIS — E039 Hypothyroidism, unspecified: Secondary | ICD-10-CM

## 2016-06-11 DIAGNOSIS — E119 Type 2 diabetes mellitus without complications: Secondary | ICD-10-CM

## 2016-06-11 DIAGNOSIS — M159 Polyosteoarthritis, unspecified: Secondary | ICD-10-CM
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
## 2016-06-11 NOTE — Progress Notes (Signed)
Patient ID: ROCHELL [name], female    DOB: 1970/08/01  Age: 46 y.o. MRN: [id number]      Subjective:   Duplicate I cannot delete   Current allergies, medications, problem list, past/family and social histories reviewed.  Objective:  BP 127/89 (BP Location: Right Arm, Patient Position: Sitting, Cuff Size: Large)   Pulse 90   Temp 98.6 F (37 C) (Oral)   Resp 18   Ht 5\' 11"  (1.803 m)   Wt (!) 379 lb 3.2 oz (172 kg)   SpO2 98%   BMI 52.89 kg/m       Assessment & Plan:   Assessment: No diagnosis found.    Plan: na No orders of the defined types were placed in this encounter.   No orders of the defined types were placed in this encounter.        Patient Instructions       IF you received an x-ray today, you will receive an invoice from [facility]. Please contact [facility] at [phone] with questions or concerns regarding your invoice.   IF you received labwork today, you will receive an invoice from [place]. Please contact [facility] at [phone] with questions or concerns regarding your invoice.   Our billing staff will not be able to assist you with questions regarding bills from these companies.  You will be contacted with the lab results as soon as they are available. The fastest way to get your results is to activate your My Chart account. Instructions are located on the last page of this paperwork. If you have not heard from [name] regarding the results in 2 weeks, please contact this office.         No Follow-up on file.   HOPPER,DAVID, MD 06/11/2016

## 2016-06-11 NOTE — Telephone Encounter (Signed)
Certification required to complete DOT Physical.  I will not be able to complete this form.  She will need to find a provider capable of this.  Often [facility] have providers that are certified.  Could try [facility].  Virginia Crews, MD, MPH PGY-3,  [facility] 06/11/2016 1:27 PM

## 2016-06-11 NOTE — Telephone Encounter (Signed)
Left voice message for patient informing her that PCP can not complete the DOT/DMV forms.  Patient would need to go to [facility]. Forms placed up front for pickup.  Derl Barrow, RN
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
## 2016-07-20 ENCOUNTER — Other Ambulatory Visit: Payer: Self-pay | Admitting: Internal Medicine

## 2016-07-20 DIAGNOSIS — R058 Other specified cough: Secondary | ICD-10-CM

## 2016-07-20 DIAGNOSIS — R05 Cough: Secondary | ICD-10-CM

## 2016-07-21 ENCOUNTER — Ambulatory Visit: Payer: Self-pay | Admitting: Internal Medicine

## 2016-07-29 ENCOUNTER — Ambulatory Visit: Payer: Self-pay | Admitting: Internal Medicine

## 2016-08-11 ENCOUNTER — Encounter: Payer: Self-pay | Admitting: Family Medicine

## 2016-08-11 ENCOUNTER — Ambulatory Visit (INDEPENDENT_AMBULATORY_CARE_PROVIDER_SITE_OTHER): Payer: BC Managed Care – PPO | Admitting: Family Medicine

## 2016-08-11 VITALS — BP 164/92 | HR 102 | Temp 99.0°F | Ht 74.0 in | Wt 388.0 lb

## 2016-08-11 DIAGNOSIS — R05 Cough: Secondary | ICD-10-CM

## 2016-08-11 DIAGNOSIS — E038 Other specified hypothyroidism: Secondary | ICD-10-CM | POA: Diagnosis not present

## 2016-08-11 DIAGNOSIS — R058 Other specified cough: Secondary | ICD-10-CM

## 2016-08-11 DIAGNOSIS — R45851 Suicidal ideations: Secondary | ICD-10-CM | POA: Diagnosis not present

## 2016-08-11 DIAGNOSIS — F411 Generalized anxiety disorder: Secondary | ICD-10-CM

## 2016-08-11 DIAGNOSIS — F332 Major depressive disorder, recurrent severe without psychotic features: Secondary | ICD-10-CM | POA: Diagnosis not present

## 2016-08-11 DIAGNOSIS — E118 Type 2 diabetes mellitus with unspecified complications: Secondary | ICD-10-CM

## 2016-08-11 LAB — POCT GLYCOSYLATED HEMOGLOBIN (HGB A1C): Hemoglobin A1C: 8.1

## 2016-08-11 MED ORDER — PANTOPRAZOLE SODIUM 40 MG PO TBEC: 40.0000 mg | DELAYED_RELEASE_TABLET | Freq: Every day | ORAL | 2 refills | Status: DC

## 2016-08-11 MED ORDER — LEVOTHYROXINE SODIUM 300 MCG PO TABS: 300.0000 ug | ORAL_TABLET | Freq: Every day | ORAL | 3 refills | Status: DC

## 2016-08-11 MED ORDER — FAMOTIDINE 20 MG PO TABS: ORAL_TABLET | ORAL | 2 refills | Status: DC

## 2016-08-11 MED ORDER — LORAZEPAM 1 MG PO TABS: 1.0000 mg | ORAL_TABLET | Freq: Three times a day (TID) | ORAL | 0 refills | Status: DC | PRN

## 2016-08-11 MED ORDER — AMLODIPINE BESYLATE 10 MG PO TABS: 10.0000 mg | ORAL_TABLET | Freq: Every day | ORAL | 3 refills | Status: DC

## 2016-08-11 MED ORDER — FLUTICASONE PROPIONATE 50 MCG/ACT NA SUSP: 2.0000 | Freq: Every day | NASAL | 2 refills | Status: DC

## 2016-08-11 MED ORDER — GLIMEPIRIDE 2 MG PO TABS: 4.0000 mg | ORAL_TABLET | Freq: Every day | ORAL | 3 refills | Status: DC

## 2016-08-11 NOTE — Assessment & Plan Note (Signed)
Ativan refilled Encouraged patient to pursue therapy again
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
## 2016-08-11 NOTE — Assessment & Plan Note (Signed)
>>  ASSESSMENT AND PLAN FOR ANXIETY WRITTEN ON 08/11/2016  6:29 PM BY Erasmo Downer, MD  Ativan refilled Encouraged patient to pursue therapy again

## 2016-08-11 NOTE — Patient Instructions (Signed)
Suicidal Feelings: How to Help Yourself  Suicide is the taking of one's own life. If you feel as though life is getting too tough to handle and are thinking about suicide, get help right away. To get help:  Call your local emergency services (911 in the [place]  Call a suicide hotline to speak with a trained counselor who understands how you are feeling. The following is a list of suicide hotlines in the [place]. For a list of hotlines in [place], visit www.suicide.org/hotlines/international/[place]-suicide-hotlines.html.  1-800-273-TALK (1-800-273-8255).  1-800-SUICIDE (1-800-784-2433).  1-888-628-9454. This is a hotline for Spanish speakers.  1-800-799-4TTY (1-800-799-4889). This is a hotline for TTY users.  1-866-4-U-TREVOR (1-866-488-7386). This is a hotline for lesbian, gay, bisexual, transgender, or questioning youth.  Contact a crisis center or a local suicide prevention center. To find a crisis center or suicide prevention center:  Call your local hospital, clinic, community service organization, mental health center, social service provider, or health department. Ask for assistance in connecting to a crisis center.  Visit www.suicidepreventionlifeline.org/getinvolved/locator for a list of crisis centers in the [place], or visit www.suicideprevention.ca/thinking-about-suicide/find-a-crisis-centre for a list of centers in [place].  Visit the following websites:  National Suicide Prevention Lifeline: www.suicidepreventionlifeline.org  Hopeline: www.hopeline.com  American Foundation for Suicide Prevention: www.afsp.org  The Trevor Project (for lesbian, gay, bisexual, transgender, or questioning youth): www.thetrevorproject.org    How can I help myself feel better?  Promise yourself that you will not do anything drastic when you have suicidal feelings. Remember, there is hope. Many people have gotten through suicidal thoughts and feelings, and you will, too. You may have gotten through them before, and  this proves that you can get through them again.  Let family, friends, teachers, or counselors know how you are feeling. Try not to isolate yourself from those who care about you. Remember, they will want to help you. Talk with someone every day, even if you do not feel sociable. Face-to-face conversation is best.  Call a mental health professional and see one regularly.  Visit your primary health care provider every year.  Eat a well-balanced diet, and space your meals so you eat regularly.  Get plenty of rest.  Avoid alcohol and drugs, and remove them from your home. They will only make you feel worse.  If you are thinking of taking a lot of medicine, give your medicine to someone who can give it to you one day at a time. If you are on antidepressants and are concerned you will overdose, let your health care provider know so he or she can give you safer medicines. Ask your mental health professional about the possible side effects of any medicines you are taking.  Remove weapons, poisons, knives, and anything else that could harm you from your home.  Try to stick to routines. Follow a schedule every day. Put self-care on your schedule.  Make a list of realistic goals, and cross them off when you achieve them. Accomplishments give a sense of worth.  Wait until you are feeling better before doing the things you find difficult or unpleasant.  Exercise if you are able. You will feel better if you exercise for even a half hour each day.  Go out in the sun or into nature. This will help you recover from depression faster. If you have a favorite place to walk, go there.  Do the things that have always given you pleasure. Play your favorite music, read a good book, paint a picture, play your favorite   anything else that takes your mind off your depression if it is safe to do.  Keep your living space well lit.  When you are feeling well, write  yourself a letter about tips and support that you can read when you are not feeling well.  Remember that life's difficulties can be sorted out with help. Conditions can be treated. You can work on thoughts and strategies that serve you well. This information is not intended to replace advice given to you by your health care provider. Make sure you discuss any questions you have with your health care provider. Document Released: 08/31/2002 Document Revised: 10/24/2015 Document Reviewed: 06/21/2013 Elsevier Interactive Patient Education  2017 [employer].

## 2016-08-11 NOTE — Assessment & Plan Note (Signed)
Patient reports passive SI and adamantly denies any plan or access to harmful substances or guns Contracted for safety and advised to call 911 if she has active SI Given hotline numbers that she can call as well She also knows that she can walk in to [facility] for an acute appointment She will call [facility] in the morning and asked to be seen sooner than her appointment next week

## 2016-08-11 NOTE — Assessment & Plan Note (Signed)
See plan for SI

## 2016-12-04 ENCOUNTER — Encounter: Payer: Self-pay | Admitting: Internal Medicine

## 2016-12-04 ENCOUNTER — Ambulatory Visit (INDEPENDENT_AMBULATORY_CARE_PROVIDER_SITE_OTHER): Payer: BC Managed Care – PPO | Admitting: Internal Medicine

## 2016-12-04 VITALS — BP 132/80 | HR 118 | Temp 99.0°F | Ht 74.0 in | Wt >= 6400 oz

## 2016-12-04 DIAGNOSIS — F332 Major depressive disorder, recurrent severe without psychotic features: Secondary | ICD-10-CM

## 2016-12-04 DIAGNOSIS — R635 Abnormal weight gain: Secondary | ICD-10-CM

## 2016-12-04 DIAGNOSIS — E118 Type 2 diabetes mellitus with unspecified complications: Secondary | ICD-10-CM

## 2016-12-04 DIAGNOSIS — R058 Other specified cough: Secondary | ICD-10-CM

## 2016-12-04 DIAGNOSIS — E038 Other specified hypothyroidism: Secondary | ICD-10-CM | POA: Diagnosis not present

## 2016-12-04 DIAGNOSIS — F411 Generalized anxiety disorder: Secondary | ICD-10-CM

## 2016-12-04 DIAGNOSIS — R05 Cough: Secondary | ICD-10-CM

## 2016-12-04 LAB — POCT GLYCOSYLATED HEMOGLOBIN (HGB A1C): Hemoglobin A1C: 10

## 2016-12-04 MED ORDER — VITAMIN D (ERGOCALCIFEROL) 1.25 MG (50000 UNIT) PO CAPS: 50000.0000 [IU] | ORAL_CAPSULE | ORAL | 0 refills | Status: DC

## 2016-12-04 MED ORDER — LORAZEPAM 1 MG PO TABS: 1.0000 mg | ORAL_TABLET | Freq: Three times a day (TID) | ORAL | 0 refills | Status: DC | PRN

## 2016-12-04 MED ORDER — FAMOTIDINE 20 MG PO TABS: ORAL_TABLET | ORAL | 2 refills | Status: DC

## 2016-12-04 MED ORDER — PANTOPRAZOLE SODIUM 40 MG PO TBEC: 40.0000 mg | DELAYED_RELEASE_TABLET | Freq: Every day | ORAL | 2 refills | Status: DC

## 2016-12-04 MED ORDER — GLIMEPIRIDE 4 MG PO TABS: 8.0000 mg | ORAL_TABLET | Freq: Every day | ORAL | 0 refills | Status: DC

## 2016-12-04 MED ORDER — FERROUS SULFATE 325 (65 FE) MG PO TABS: 325.0000 mg | ORAL_TABLET | Freq: Three times a day (TID) | ORAL | 3 refills | Status: DC

## 2016-12-04 MED ORDER — AMLODIPINE BESYLATE 10 MG PO TABS: 10.0000 mg | ORAL_TABLET | Freq: Every day | ORAL | 3 refills | Status: DC

## 2016-12-04 MED ORDER — LEVOTHYROXINE SODIUM 300 MCG PO TABS: 300.0000 ug | ORAL_TABLET | Freq: Every day | ORAL | 3 refills | Status: DC

## 2016-12-04 MED ORDER — FLUTICASONE PROPIONATE 50 MCG/ACT NA SUSP: 2.0000 | Freq: Every day | NASAL | 2 refills | Status: DC

## 2016-12-04 NOTE — Patient Instructions (Signed)
It was nice meeting you today [name]!  Please increase your glimepiride to 8mg  per day. I have increased the dosage of the tablets, so you will still only take two pills per day.   Please continue taking your other medications as you have been.   I would like to see you back in one month to see how your blood sugars have been. Please try to monitor them every day and write down what numbers you get. This will help [name] determine if we need to make adjustments to your medications.   If you have any questions or concerns, please feel free to call the clinic.   Be well,  Dr. Avon Gully

## 2016-12-04 NOTE — Progress Notes (Signed)
Subjective:   Patient: [name]       Birthdate: 12-07-1970       MRN: [id number]      HPI  [name] is a 46 y.o. female presenting for Type II DM, depression/bipolar disorder, and weight concerns.   Type II DM Is very concerned that her diabetes is not well-controlled. Does not check CBGs multiple times a day because she has difficulty affording test strips. Usually has fasting CBG around 150. Post-prandial around 250-300. Has been taking 4mg  glimeperide before dinner. Says she can't take metformin because of GI upset. Reports increased urination as well as polydipsia.   Depression/bipolar disorder Followed by Dr. Grover Canavan at [facility] who she last saw yesterday. Is now on clonidine, Buspar, Prozac, Remeron, and Vrylar. Was diagnosed with bipolar disorder in June, and is happy to know that she now has a proper diagnosis, rather than just depression. Trazodone has been discontinued because patient said it "stopped working." Is still taking Ativan which is prescribed by [facility]. Is prescribed 1mg  tabs TID PRN, and says doesn't take daily. Some days does require all three tabs but this is uncommon. Generally feels like her mood is significantly improved and is very happy about this.    Weight concerns Patient very upset because she has gained weight, and currently weighs more than she ever has, even when she was pregnant. Thinks this is due to medication side effects, as this is what her psychiatrist told her. Does not want to stop her psych meds because she is happy that her mood has improved, but wants to lose weight. Has tried making dietary changes but do not feel they have been effective. Is not interested in meeting with a nutritionist as she says she knows what she needs to do.   Smoking status reviewed. Patient is never smoker.   Review of Systems See HPI.     Objective:  Physical Exam  Constitutional: She is oriented to person, place, and time.  Obese female in NAD    HENT:  Head: Normocephalic and atraumatic.  Cardiovascular: Normal rate.   Pulmonary/Chest: Effort normal. No respiratory distress.  Neurological: She is alert and oriented to person, place, and time.  Skin: Skin is warm and dry.  Psychiatric:  Tearful throughout encounter  Vitals reviewed.     Assessment & Plan:  Type II diabetes mellitus with complication (HCC) Poorly controlled. A1C today 10.0, up from 8.1 at last visit. Reported CBGs at home not in goal range. Patient very concerned about glycemic control, however is also very apprehensive to starting insulin today. As glimeperide is not at max dose, will increase from 4mg  qd to 8mg  qd. Discussed with patient monitoring CBGs multiple times a day and writing down values, then following up in one month. If recorded CBGs still not in goal range, will consider adding a second agent. Patient amenable to this plan.   MDD (major depressive disorder), recurrent severe, without psychosis ([place]) Followed by Grover Canavan. Currently on regimen of clonidine, Buspar, Prozac, Remeron, and Vrylar. No longer on trazodone. Also with reported new diagnosis of bipolar disorder. Mood currently well-controlled per patient, however tearful throughout encounter, though primarily because of weight gain. Still using Ativan, though not daily. All medications prescribed by psych except Ativan. Will refill today.  - Ativan #90 with 3 refills written today - Continue to f/u with psych  Weight gain Currently at 412 lbs, which patient says is most she has ever weighed. Has been told  this is due to medication, however suspect diet and lack of exercise is also playing major component in this. Patient very tearful when discussing this and wanting to make a change. Discussed meeting with Dr. Jenne Campus, however patient does not want to do this. Also offered to set weight management goals with patient at today's visit, however patient declined this as well.  - F/u at next  appt - Will try again to either set goals with patient or have her f/u with Dr. Jenne Campus if amenable   Adin Hector, MD, MPH PGY-3 [facility] Pager [phone]

## 2016-12-06 ENCOUNTER — Encounter: Payer: Self-pay | Admitting: Internal Medicine

## 2016-12-06 DIAGNOSIS — R635 Abnormal weight gain: Secondary | ICD-10-CM | POA: Insufficient documentation

## 2016-12-06 NOTE — Assessment & Plan Note (Signed)
Poorly controlled. A1C today 10.0, up from 8.1 at last visit. Reported CBGs at home not in goal range. Patient very concerned about glycemic control, however is also very apprehensive to starting insulin today. As glimeperide is not at max dose, will increase from 4mg  qd to 8mg  qd. Discussed with patient monitoring CBGs multiple times a day and writing down values, then following up in one month. If recorded CBGs still not in goal range, will consider adding a second agent. Patient amenable to this plan.

## 2016-12-06 NOTE — Assessment & Plan Note (Signed)
Followed by [name]. Currently on regimen of clonidine, Buspar, Prozac, Remeron, and Vrylar. No longer on trazodone. Also with reported new diagnosis of bipolar disorder. Mood currently well-controlled per patient, however tearful throughout encounter, though primarily because of weight gain. Still using Ativan, though not daily. All medications prescribed by psych except Ativan. Will refill today.  - Ativan #90 with 3 refills written today - Continue to f/u with psych

## 2016-12-06 NOTE — Assessment & Plan Note (Signed)
Currently at 412 lbs, which patient says is most she has ever weighed. Has been told this is due to medication, however suspect diet and lack of exercise is also playing major component in this. Patient very tearful when discussing this and wanting to make a change. Discussed meeting with Dr. Jenne Campus, however patient does not want to do this. Also offered to set weight management goals with patient at today's visit, however patient declined this as well.  - F/u at next appt - Will try again to either set goals with patient or have her f/u with Dr. Jenne Campus if amenable

## 2017-01-23 ENCOUNTER — Ambulatory Visit (HOSPITAL_COMMUNITY)
Admission: EM | Admit: 2017-01-23 | Discharge: 2017-01-23 | Disposition: A | Discharge status: Home / Self Care | Payer: Worker's Compensation | Attending: Internal Medicine | Admitting: Internal Medicine

## 2017-01-23 ENCOUNTER — Encounter (HOSPITAL_COMMUNITY): Payer: Self-pay | Admitting: Family Medicine

## 2017-01-23 ENCOUNTER — Ambulatory Visit (INDEPENDENT_AMBULATORY_CARE_PROVIDER_SITE_OTHER): Payer: Worker's Compensation

## 2017-01-23 DIAGNOSIS — S20219A Contusion of unspecified front wall of thorax, initial encounter: Secondary | ICD-10-CM

## 2017-01-23 MED ORDER — HYDROCODONE-ACETAMINOPHEN 5-325 MG PO TABS
2.0000 | ORAL_TABLET | ORAL | 0 refills | Status: DC | PRN
Start: 2017-01-23 — End: 2017-03-18

## 2017-01-23 MED ORDER — CYCLOBENZAPRINE HCL 10 MG PO TABS: 10.0000 mg | ORAL_TABLET | Freq: Two times a day (BID) | ORAL | 0 refills | Status: DC | PRN

## 2017-01-23 MED ORDER — ALBUTEROL SULFATE HFA 108 (90 BASE) MCG/ACT IN AERS
INHALATION_SPRAY | RESPIRATORY_TRACT | Status: AC
  Filled 2017-01-23: qty 6.7

## 2017-01-23 MED ORDER — ALBUTEROL SULFATE HFA 108 (90 BASE) MCG/ACT IN AERS
2.0000 | INHALATION_SPRAY | Freq: Once | RESPIRATORY_TRACT | Status: AC
  Administered 2017-01-23: 2 via RESPIRATORY_TRACT
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
## 2017-01-23 NOTE — Discharge Instructions (Signed)
Return if any problems.

## 2017-03-06 ENCOUNTER — Other Ambulatory Visit: Payer: Self-pay

## 2017-03-06 ENCOUNTER — Ambulatory Visit: Payer: BC Managed Care – PPO | Admitting: Internal Medicine

## 2017-03-06 ENCOUNTER — Encounter: Payer: Self-pay | Admitting: Internal Medicine

## 2017-03-06 VITALS — BP 122/68 | HR 105 | Temp 98.5°F | Ht 74.0 in | Wt >= 6400 oz

## 2017-03-06 DIAGNOSIS — E118 Type 2 diabetes mellitus with unspecified complications: Secondary | ICD-10-CM | POA: Diagnosis not present

## 2017-03-06 DIAGNOSIS — F411 Generalized anxiety disorder: Secondary | ICD-10-CM | POA: Diagnosis not present

## 2017-03-06 DIAGNOSIS — Z23 Encounter for immunization: Secondary | ICD-10-CM | POA: Diagnosis not present

## 2017-03-06 LAB — POCT GLYCOSYLATED HEMOGLOBIN (HGB A1C): Hemoglobin A1C: 10.4

## 2017-03-06 MED ORDER — LORAZEPAM 1 MG PO TABS: 1.0000 mg | ORAL_TABLET | Freq: Three times a day (TID) | ORAL | 0 refills | Status: DC | PRN

## 2017-03-06 MED ORDER — SITAGLIPTIN PHOSPHATE 100 MG PO TABS: 100.0000 mg | ORAL_TABLET | Freq: Every day | ORAL | 1 refills | Status: DC

## 2017-03-06 NOTE — Assessment & Plan Note (Signed)
A1C slightly elevated today from 10.0 in 11/2016 to 10.4 today, despite increased dose of glimepiride. Home blood sugar measurements consistently above goal in 200s and 300s. As glimepiride does not seem to be working well for this patient, will discontinue and try another agent. Given desire for weight loss, will begin Januvia 100mg  qd. If not affordable for patient, will call in another agent in its place. Also discussed that weight loss will help improve glycemic control, and set three weight management goals today.  - Repeat A1C in 3 mo

## 2017-03-06 NOTE — Patient Instructions (Addendum)
It was nice seeing you again today [name]!  Please STOP taking glimepiride, and START taking Januvia. You will take one 100 mg tablet daily. If this is too expensive, please call to let me know and we will try a different medication.   We set three weight management goals today: 1. Drink at least 40 ounces of water daily.  2. Decrease Diet Dr. Malachi Bonds to no more than twice daily.  3. Begin walking on the treadmill at [employer] for 15 minutes three days per week.   I will see you back in one month to see how you are doing.   If you have any questions or concerns, please feel free to call the clinic.   Be well,  Dr. Avon Gully

## 2017-03-06 NOTE — Assessment & Plan Note (Signed)
Weight increased two pounds from last visit. Patient amenable to setting specific goals today: 1. Drink at least 40 ounces of water daily.  2. Decrease Diet Dr. Malachi Bonds to no more than twice daily.  3. Begin walking on the treadmill at [employer] for 15 minutes three days per week.  Also discussed bariatric surgery with patient. She said she is open to this, however would like to try the aforementioned goals first and give it some more thought. F/u in one month, and will discuss bariatric referral again at that time.

## 2017-03-06 NOTE — Progress Notes (Signed)
Subjective:   Patient: [name]       Birthdate: June 05, 1970       MRN: [id number]      HPI  [name] is a 46 y.o. female presenting for f/u of Type II DM and obesity. Also wishing to discuss recent sinus pressure.    Type II DM At last appt three months ago, glimepiride increased from 4mg  to 8mg . Patient also encouraged to continue monitoring blood sugar daily. A1C 10 at that visit.  Today, A1C increased to 10.4. Patient has been taking glimepiride 8mg  as prescribed. Has been measuring blood sugar daily, usually with readings in the 200s, sometimes >300. Is very concerned about her glycemic control and wants to do whatever she can to improve this.   Obesity Patient still very concerned that she currently weighs more than she ever has in her life, even when pregnant. Weight up two pounds today from last visit (414lb today). She knows what she needs to do from an exercise and nutrition standpoint, but has a hard time getting motivated. She would like to set specific goals today to her her achieve her larger weight loss goals.   Sinus pressure Ongoing for about 3 weeks. With accompanying nasal congestion and mucous production. Bought a humidifier which has helped especially at night. Some nights has had difficulty sleeping due to sinus pressure. Has taken ibuprofen and Tylenol which she said didn't help much. Has also been taking a OTC cold medication from the [employer] which hasn't helped. Flonase has not helped either.   Smoking status reviewed. Patient is never smoker.   Review of Systems See HPI.     Objective:  Physical Exam  Constitutional: She is oriented to person, place, and time.  Obese female in NAD  HENT:  Head: Normocephalic and atraumatic.  Mouth/Throat: Oropharynx is clear and moist. No oropharyngeal exudate.  TTP of frontal sinuses. Nasal turbinates swollen and mildly erythematous bilaterally.   Eyes: Conjunctivae and EOM are normal. Pupils are equal,  round, and reactive to light. Right eye exhibits no discharge. Left eye exhibits no discharge.  Neck: Normal range of motion. Neck supple.  Cardiovascular: Normal rate, regular rhythm and normal heart sounds.  No murmur heard. Pulmonary/Chest: Effort normal and breath sounds normal. No respiratory distress. She has no wheezes. She has no rales.  Lymphadenopathy:    She has no cervical adenopathy.  Neurological: She is alert and oriented to person, place, and time.  Skin: Skin is warm and dry.  Psychiatric: Affect and judgment normal.      Assessment & Plan:  Type II diabetes mellitus with complication (HCC) W2O slightly elevated today from 10.0 in 11/2016 to 10.4 today, despite increased dose of glimepiride. Home blood sugar measurements consistently above goal in 200s and 300s. As glimepiride does not seem to be working well for this patient, will discontinue and try another agent. Given desire for weight loss, will begin Januvia 100mg  qd. If not affordable for patient, will call in another agent in its place. Also discussed that weight loss will help improve glycemic control, and set three weight management goals today.  - Repeat A1C in 3 mo  Morbid obesity (HCC) Weight increased two pounds from last visit. Patient amenable to setting specific goals today: 1. Drink at least 40 ounces of water daily.  2. Decrease Diet Dr. Malachi Bonds to no more than twice daily.  3. Begin walking on the treadmill at [employer] for 15 minutes three days per  week.  Also discussed bariatric surgery with patient. She said she is open to this, however would like to try the aforementioned goals first and give it some more thought. F/u in one month, and will discuss bariatric referral again at that time.   Precepted with Dr. Nori Riis.   Adin Hector, MD, MPH PGY-3 [facility] Pager [phone]

## 2017-03-14 ENCOUNTER — Encounter (HOSPITAL_COMMUNITY): Payer: Self-pay | Admitting: *Deleted

## 2017-03-14 ENCOUNTER — Inpatient Hospital Stay (HOSPITAL_COMMUNITY)
Admission: EM | Admit: 2017-03-14 | Discharge: 2017-03-18 | DRG: 190 | Disposition: A | Discharge status: Home / Self Care | Payer: BC Managed Care – PPO | Attending: Family Medicine | Admitting: Family Medicine

## 2017-03-14 ENCOUNTER — Other Ambulatory Visit: Payer: Self-pay

## 2017-03-14 ENCOUNTER — Emergency Department (HOSPITAL_COMMUNITY): Payer: BC Managed Care – PPO

## 2017-03-14 DIAGNOSIS — J45901 Unspecified asthma with (acute) exacerbation: Secondary | ICD-10-CM

## 2017-03-14 DIAGNOSIS — I248 Other forms of acute ischemic heart disease: Secondary | ICD-10-CM | POA: Diagnosis present

## 2017-03-14 DIAGNOSIS — R0602 Shortness of breath: Secondary | ICD-10-CM | POA: Diagnosis not present

## 2017-03-14 DIAGNOSIS — Z825 Family history of asthma and other chronic lower respiratory diseases: Secondary | ICD-10-CM | POA: Diagnosis not present

## 2017-03-14 DIAGNOSIS — Z915 Personal history of self-harm: Secondary | ICD-10-CM

## 2017-03-14 DIAGNOSIS — Z7982 Long term (current) use of aspirin: Secondary | ICD-10-CM

## 2017-03-14 DIAGNOSIS — E1165 Type 2 diabetes mellitus with hyperglycemia: Secondary | ICD-10-CM | POA: Diagnosis present

## 2017-03-14 DIAGNOSIS — E872 Acidosis: Secondary | ICD-10-CM | POA: Diagnosis present

## 2017-03-14 DIAGNOSIS — Z7951 Long term (current) use of inhaled steroids: Secondary | ICD-10-CM

## 2017-03-14 DIAGNOSIS — Z7984 Long term (current) use of oral hypoglycemic drugs: Secondary | ICD-10-CM

## 2017-03-14 DIAGNOSIS — J45909 Unspecified asthma, uncomplicated: Secondary | ICD-10-CM | POA: Insufficient documentation

## 2017-03-14 DIAGNOSIS — N183 Chronic kidney disease, stage 3 (moderate): Secondary | ICD-10-CM | POA: Diagnosis present

## 2017-03-14 DIAGNOSIS — J189 Pneumonia, unspecified organism: Secondary | ICD-10-CM

## 2017-03-14 DIAGNOSIS — J309 Allergic rhinitis, unspecified: Secondary | ICD-10-CM | POA: Diagnosis present

## 2017-03-14 DIAGNOSIS — Z9049 Acquired absence of other specified parts of digestive tract: Secondary | ICD-10-CM | POA: Diagnosis not present

## 2017-03-14 DIAGNOSIS — Z79890 Hormone replacement therapy (postmenopausal): Secondary | ICD-10-CM

## 2017-03-14 DIAGNOSIS — R0981 Nasal congestion: Secondary | ICD-10-CM | POA: Diagnosis present

## 2017-03-14 DIAGNOSIS — I10 Essential (primary) hypertension: Secondary | ICD-10-CM | POA: Diagnosis not present

## 2017-03-14 DIAGNOSIS — Z6841 Body Mass Index (BMI) 40.0 and over, adult: Secondary | ICD-10-CM

## 2017-03-14 DIAGNOSIS — F319 Bipolar disorder, unspecified: Secondary | ICD-10-CM | POA: Diagnosis present

## 2017-03-14 DIAGNOSIS — E1122 Type 2 diabetes mellitus with diabetic chronic kidney disease: Secondary | ICD-10-CM | POA: Diagnosis present

## 2017-03-14 DIAGNOSIS — R0600 Dyspnea, unspecified: Secondary | ICD-10-CM | POA: Diagnosis not present

## 2017-03-14 DIAGNOSIS — J440 Chronic obstructive pulmonary disease with acute lower respiratory infection: Secondary | ICD-10-CM | POA: Diagnosis present

## 2017-03-14 DIAGNOSIS — J159 Unspecified bacterial pneumonia: Secondary | ICD-10-CM | POA: Diagnosis present

## 2017-03-14 DIAGNOSIS — I130 Hypertensive heart and chronic kidney disease with heart failure and stage 1 through stage 4 chronic kidney disease, or unspecified chronic kidney disease: Secondary | ICD-10-CM | POA: Diagnosis present

## 2017-03-14 DIAGNOSIS — Z886 Allergy status to analgesic agent status: Secondary | ICD-10-CM

## 2017-03-14 DIAGNOSIS — I5020 Unspecified systolic (congestive) heart failure: Secondary | ICD-10-CM | POA: Diagnosis present

## 2017-03-14 DIAGNOSIS — E039 Hypothyroidism, unspecified: Secondary | ICD-10-CM | POA: Diagnosis present

## 2017-03-14 DIAGNOSIS — Z888 Allergy status to other drugs, medicaments and biological substances status: Secondary | ICD-10-CM

## 2017-03-14 DIAGNOSIS — Z79899 Other long term (current) drug therapy: Secondary | ICD-10-CM

## 2017-03-14 DIAGNOSIS — J441 Chronic obstructive pulmonary disease with (acute) exacerbation: Principal | ICD-10-CM | POA: Diagnosis present

## 2017-03-14 DIAGNOSIS — F411 Generalized anxiety disorder: Secondary | ICD-10-CM | POA: Diagnosis present

## 2017-03-14 DIAGNOSIS — R0902 Hypoxemia: Secondary | ICD-10-CM | POA: Diagnosis present

## 2017-03-14 DIAGNOSIS — Z91040 Latex allergy status: Secondary | ICD-10-CM

## 2017-03-14 HISTORY — DX: Unspecified asthma, uncomplicated: J45.909

## 2017-03-14 LAB — URINALYSIS, ROUTINE W REFLEX MICROSCOPIC
BILIRUBIN URINE: NEGATIVE
Glucose, UA: >=500 mg/dL — AB
KETONES UR: NEGATIVE mg/dL
LEUKOCYTES UA: NEGATIVE
NITRITE: NEGATIVE
Protein, ur: NEGATIVE mg/dL
Specific Gravity, Urine: 1.017 (ref 1.005–1.030)
Squamous Epithelial / LPF: NONE SEEN
pH: 5 (ref 5.0–8.0)

## 2017-03-14 LAB — CBC WITH DIFFERENTIAL/PLATELET
Basophils Absolute: 0 10*3/uL (ref 0.0–0.1)
Basophils Relative: 0 %
EOS ABS: 0.1 10*3/uL (ref 0.0–0.7)
Eosinophils Relative: 1 %
HCT: 35.5 % — ABNORMAL LOW (ref 36.0–46.0)
HEMOGLOBIN: 10.8 g/dL — AB (ref 12.0–15.0)
Lymphocytes Relative: 22 %
Lymphs Abs: 1.7 10*3/uL (ref 0.7–4.0)
MCH: 25.7 pg — ABNORMAL LOW (ref 26.0–34.0)
MCHC: 30.4 g/dL (ref 30.0–36.0)
MCV: 84.3 fL (ref 78.0–100.0)
MONOS PCT: 10 %
Monocytes Absolute: 0.8 10*3/uL (ref 0.1–1.0)
NEUTROS PCT: 67 %
Neutro Abs: 5.3 10*3/uL (ref 1.7–7.7)
PLATELETS: 503 10*3/uL — AB (ref 150–400)
RBC: 4.21 MIL/uL (ref 3.87–5.11)
RDW: 14.8 % (ref 11.5–15.5)
WBC: 8 10*3/uL (ref 4.0–10.5)

## 2017-03-14 LAB — INFLUENZA PANEL BY PCR (TYPE A & B)
INFLBPCR: NEGATIVE
Influenza A By PCR: NEGATIVE

## 2017-03-14 LAB — BLOOD GAS, ARTERIAL
Acid-base deficit: 5.7 mmol/L — ABNORMAL HIGH (ref 0.0–2.0)
Bicarbonate: 19.1 mmol/L — ABNORMAL LOW (ref 20.0–28.0)
Drawn by: 52078
O2 Content: 21 L/min
O2 Saturation: 92.8 %
PATIENT TEMPERATURE: 98.6
PCO2 ART: 36.9 mmHg (ref 32.0–48.0)
pH, Arterial: 7.335 — ABNORMAL LOW (ref 7.350–7.450)
pO2, Arterial: 73.1 mmHg — ABNORMAL LOW (ref 83.0–108.0)

## 2017-03-14 LAB — COMPREHENSIVE METABOLIC PANEL
ALK PHOS: 67 U/L (ref 38–126)
ALT: 19 U/L (ref 14–54)
AST: 27 U/L (ref 15–41)
Albumin: 3.5 g/dL (ref 3.5–5.0)
Anion gap: 9 (ref 5–15)
BUN: 14 mg/dL (ref 6–20)
CALCIUM: 9.1 mg/dL (ref 8.9–10.3)
CO2: 24 mmol/L (ref 22–32)
CREATININE: 1.48 mg/dL — AB (ref 0.44–1.00)
Chloride: 101 mmol/L (ref 101–111)
GFR, EST AFRICAN AMERICAN: 48 mL/min — AB (ref >60)
GFR, EST NON AFRICAN AMERICAN: 41 mL/min — AB (ref >60)
Glucose, Bld: 358 mg/dL — ABNORMAL HIGH (ref 65–99)
Potassium: 4.6 mmol/L (ref 3.5–5.1)
Sodium: 134 mmol/L — ABNORMAL LOW (ref 135–145)
Total Bilirubin: 0.3 mg/dL (ref 0.3–1.2)
Total Protein: 8.4 g/dL — ABNORMAL HIGH (ref 6.5–8.1)

## 2017-03-14 LAB — TROPONIN I
TROPONIN I: 0.09 ng/mL — AB (ref <0.03)
TROPONIN I: 0.03 ng/mL — AB (ref <0.03)

## 2017-03-14 LAB — GLUCOSE, CAPILLARY: GLUCOSE-CAPILLARY: 460 mg/dL — AB (ref 65–99)

## 2017-03-14 LAB — I-STAT CG4 LACTIC ACID, ED
LACTIC ACID, VENOUS: 2.48 mmol/L — AB (ref 0.5–1.9)
Lactic Acid, Venous: 3.07 mmol/L (ref 0.5–1.9)

## 2017-03-14 LAB — I-STAT BETA HCG BLOOD, ED (MC, WL, AP ONLY): I-stat hCG, quantitative: <5 m[IU]/mL (ref <5)

## 2017-03-14 LAB — TSH: TSH: 2.016 u[IU]/mL (ref 0.350–4.500)

## 2017-03-14 LAB — BRAIN NATRIURETIC PEPTIDE: B NATRIURETIC PEPTIDE 5: 38.9 pg/mL (ref 0.0–100.0)

## 2017-03-14 MED ORDER — ACETAMINOPHEN 650 MG RE SUPP: 650.0000 mg | Freq: Four times a day (QID) | RECTAL | Status: DC | PRN

## 2017-03-14 MED ORDER — INSULIN ASPART 100 UNIT/ML ~~LOC~~ SOLN
15.0000 [IU] | Freq: Once | SUBCUTANEOUS | Status: AC
  Administered 2017-03-14: 15 [IU] via SUBCUTANEOUS

## 2017-03-14 MED ORDER — IPRATROPIUM-ALBUTEROL 0.5-2.5 (3) MG/3ML IN SOLN
3.0000 mL | RESPIRATORY_TRACT | Status: DC
  Administered 2017-03-15 – 2017-03-18 (×18): 3 mL via RESPIRATORY_TRACT
  Filled 2017-03-14 (×21): qty 3

## 2017-03-14 MED ORDER — FLUOXETINE HCL 20 MG PO CAPS
80.0000 mg | ORAL_CAPSULE | Freq: Every day | ORAL | Status: DC
  Administered 2017-03-14 – 2017-03-18 (×5): 80 mg via ORAL
  Filled 2017-03-14 (×7): qty 4

## 2017-03-14 MED ORDER — ONDANSETRON HCL 4 MG/2ML IJ SOLN: 4.0000 mg | Freq: Four times a day (QID) | INTRAMUSCULAR | Status: DC | PRN

## 2017-03-14 MED ORDER — MIRTAZAPINE 15 MG PO TABS
15.0000 mg | ORAL_TABLET | Freq: Every day | ORAL | Status: DC
  Administered 2017-03-14 – 2017-03-17 (×4): 15 mg via ORAL
  Filled 2017-03-14 (×5): qty 1

## 2017-03-14 MED ORDER — MONTELUKAST SODIUM 10 MG PO TABS
10.0000 mg | ORAL_TABLET | Freq: Every day | ORAL | Status: DC
  Administered 2017-03-14 – 2017-03-17 (×4): 10 mg via ORAL
  Filled 2017-03-14 (×5): qty 1

## 2017-03-14 MED ORDER — SODIUM CHLORIDE 0.9 % IV BOLUS (SEPSIS)
1000.0000 mL | Freq: Once | INTRAVENOUS | Status: AC
  Administered 2017-03-14: 1000 mL via INTRAVENOUS

## 2017-03-14 MED ORDER — ALBUTEROL SULFATE (2.5 MG/3ML) 0.083% IN NEBU: 2.5000 mg | INHALATION_SOLUTION | RESPIRATORY_TRACT | Status: DC | PRN

## 2017-03-14 MED ORDER — CARIPRAZINE HCL 3 MG PO CAPS
3.0000 mg | ORAL_CAPSULE | Freq: Every day | ORAL | Status: DC
  Administered 2017-03-14 – 2017-03-18 (×5): 3 mg via ORAL
  Filled 2017-03-14 (×5): qty 1

## 2017-03-14 MED ORDER — PREDNISONE 20 MG PO TABS
60.0000 mg | ORAL_TABLET | Freq: Once | ORAL | Status: AC
  Administered 2017-03-14: 60 mg via ORAL
  Filled 2017-03-14: qty 3

## 2017-03-14 MED ORDER — FLUTICASONE PROPIONATE 50 MCG/ACT NA SUSP
1.0000 | Freq: Every day | NASAL | Status: DC
  Administered 2017-03-15 – 2017-03-18 (×4): 1 via NASAL
  Filled 2017-03-14: qty 16

## 2017-03-14 MED ORDER — ALBUTEROL SULFATE (2.5 MG/3ML) 0.083% IN NEBU
5.0000 mg | INHALATION_SOLUTION | Freq: Once | RESPIRATORY_TRACT | Status: AC
  Administered 2017-03-14: 5 mg via RESPIRATORY_TRACT
  Filled 2017-03-14: qty 6

## 2017-03-14 MED ORDER — IPRATROPIUM-ALBUTEROL 0.5-2.5 (3) MG/3ML IN SOLN
3.0000 mL | Freq: Once | RESPIRATORY_TRACT | Status: AC
  Administered 2017-03-14: 3 mL via RESPIRATORY_TRACT
  Filled 2017-03-14: qty 3

## 2017-03-14 MED ORDER — LEVOTHYROXINE SODIUM 100 MCG PO TABS
300.0000 ug | ORAL_TABLET | Freq: Every day | ORAL | Status: DC
  Administered 2017-03-15 – 2017-03-18 (×4): 300 ug via ORAL
  Filled 2017-03-14: qty 2
  Filled 2017-03-14 (×4): qty 3

## 2017-03-14 MED ORDER — CLONIDINE HCL 0.1 MG PO TABS
0.1000 mg | ORAL_TABLET | Freq: Every day | ORAL | Status: DC
  Administered 2017-03-14 – 2017-03-18 (×5): 0.1 mg via ORAL
  Filled 2017-03-14 (×5): qty 1

## 2017-03-14 MED ORDER — BUSPIRONE HCL 10 MG PO TABS
10.0000 mg | ORAL_TABLET | Freq: Three times a day (TID) | ORAL | Status: DC
  Administered 2017-03-14 – 2017-03-18 (×11): 10 mg via ORAL
  Filled 2017-03-14 (×12): qty 1

## 2017-03-14 MED ORDER — FERROUS SULFATE 325 (65 FE) MG PO TABS
325.0000 mg | ORAL_TABLET | Freq: Every day | ORAL | Status: DC
  Administered 2017-03-15 – 2017-03-18 (×4): 325 mg via ORAL
  Filled 2017-03-14 (×5): qty 1

## 2017-03-14 MED ORDER — PANTOPRAZOLE SODIUM 40 MG PO TBEC
40.0000 mg | DELAYED_RELEASE_TABLET | Freq: Every day | ORAL | Status: DC
  Administered 2017-03-14 – 2017-03-18 (×5): 40 mg via ORAL
  Filled 2017-03-14 (×5): qty 1

## 2017-03-14 MED ORDER — FAMOTIDINE 20 MG PO TABS: 20.0000 mg | ORAL_TABLET | Freq: Every day | ORAL | Status: DC

## 2017-03-14 MED ORDER — OLANZAPINE 5 MG PO TABS: 5.0000 mg | ORAL_TABLET | Freq: Every day | ORAL | Status: DC

## 2017-03-14 MED ORDER — MAGNESIUM SULFATE 2 GM/50ML IV SOLN
2.0000 g | Freq: Once | INTRAVENOUS | Status: AC
  Administered 2017-03-14: 2 g via INTRAVENOUS
  Filled 2017-03-14: qty 50

## 2017-03-14 MED ORDER — POLYETHYLENE GLYCOL 3350 17 G PO PACK
17.0000 g | PACK | Freq: Every day | ORAL | Status: DC | PRN
  Administered 2017-03-16: 17 g via ORAL
  Filled 2017-03-14: qty 1

## 2017-03-14 MED ORDER — ACETAMINOPHEN 325 MG PO TABS
650.0000 mg | ORAL_TABLET | Freq: Four times a day (QID) | ORAL | Status: DC | PRN
  Administered 2017-03-16 – 2017-03-17 (×3): 650 mg via ORAL
  Filled 2017-03-14 (×3): qty 2

## 2017-03-14 MED ORDER — AMLODIPINE BESYLATE 10 MG PO TABS
10.0000 mg | ORAL_TABLET | Freq: Every day | ORAL | Status: DC
  Administered 2017-03-14 – 2017-03-18 (×5): 10 mg via ORAL
  Filled 2017-03-14 (×5): qty 1

## 2017-03-14 MED ORDER — SODIUM CHLORIDE 0.9 % IV BOLUS (SEPSIS): 1000.0000 mL | Freq: Once | INTRAVENOUS | Status: DC

## 2017-03-14 MED ORDER — PREDNISONE 50 MG PO TABS
60.0000 mg | ORAL_TABLET | Freq: Every day | ORAL | Status: DC
  Administered 2017-03-15 – 2017-03-18 (×4): 60 mg via ORAL
  Filled 2017-03-14 (×4): qty 1

## 2017-03-14 MED ORDER — LORAZEPAM 1 MG PO TABS
1.0000 mg | ORAL_TABLET | Freq: Three times a day (TID) | ORAL | Status: DC | PRN
  Administered 2017-03-14 – 2017-03-17 (×4): 1 mg via ORAL
  Filled 2017-03-14 (×4): qty 1

## 2017-03-14 MED ORDER — ENOXAPARIN SODIUM 40 MG/0.4ML ~~LOC~~ SOLN
40.0000 mg | SUBCUTANEOUS | Status: DC
  Administered 2017-03-15 – 2017-03-16 (×2): 40 mg via SUBCUTANEOUS
  Filled 2017-03-14 (×2): qty 0.4

## 2017-03-14 MED ORDER — LEVALBUTEROL HCL 0.63 MG/3ML IN NEBU: 0.6300 mg | INHALATION_SOLUTION | Freq: Four times a day (QID) | RESPIRATORY_TRACT | Status: DC | PRN

## 2017-03-14 MED ORDER — ONDANSETRON HCL 4 MG PO TABS: 4.0000 mg | ORAL_TABLET | Freq: Four times a day (QID) | ORAL | Status: DC | PRN

## 2017-03-14 MED ORDER — INSULIN ASPART 100 UNIT/ML ~~LOC~~ SOLN
0.0000 [IU] | Freq: Three times a day (TID) | SUBCUTANEOUS | Status: DC
  Administered 2017-03-15: 20 [IU] via SUBCUTANEOUS
  Administered 2017-03-15: 15 [IU] via SUBCUTANEOUS

## 2017-03-14 NOTE — ED Notes (Signed)
Expiratory wheezing throughout, though improved.  Sob somewhat improved, though pt requests O2 in waiting room.

## 2017-03-14 NOTE — ED Notes (Signed)
Pt ambulated to restroom and o2 sats noted to be 95% when starting out and once at restrooms sats noted to have dropped to 92%. Pt had audible wheezing and notable SOB. PA made aware.

## 2017-03-14 NOTE — ED Notes (Signed)
PT now states unable to lay flat x 2 nights.  States some LE edema, though none noted in triage.

## 2017-03-14 NOTE — ED Notes (Signed)
No improvement after albuterol.

## 2017-03-14 NOTE — ED Notes (Signed)
ED Provider at bedside. 

## 2017-03-14 NOTE — ED Provider Notes (Signed)
  Face-to-face evaluation   History: She presents for evaluation of shortness position, cough and wheezing.  She uses home nebulizer.  Physical exam: Morbidly obese, no respiratory distress.  Vital signs normal on room air.  Lungs have bilateral generalized wheezing.  Medical screening examination/treatment/procedure(s) were conducted as a shared visit with non-physician practitioner(s) and myself.  I personally evaluated the patient during the encounter    Daleen Bo, MD 03/14/17 1904

## 2017-03-14 NOTE — ED Notes (Signed)
Dr. Andria Frames paged re: Troponin 0.09, HR 120's 45 minutes after ambulating back from [facility] and [facility]N.  No orders received.  MD will relay information to resident on call.

## 2017-03-14 NOTE — ED Provider Notes (Signed)
[facility] EMERGENCY DEPARTMENT Provider Note   CSN: [id number] Arrival date & time: 03/14/17  1330     History   Chief Complaint Chief Complaint  Patient presents with  . Shortness of Breath    HPI [name] is a 47 y.o. female presenting with cough and shortness of breath.  Pt states that she started coughing on Thursday.  She had increased shortness of breath yesterday.  She has been using her at home nebulizer, albuterol inhaler, and Robitussin without improvement.  Today she had worsening shortness of breath.  She reports improvement with treatment given in the ER which so far includes 2 duo nebs and prednisone.  She reports she still is wheezing and coughing frequently.  Her cough is nonproductive.  She denies fevers, chills, chest pain, nausea, vomiting, abdominal pain, urinary symptoms, abnormal bowel movements.  She denies leg pain or swelling.  She denies recent travel, surgery, or immobilization.  She denies history of blood clots or cancer.  She is not on hormone therapy.  She follows with Dr. Melvyn Novas from pulmonology.  She was diagnosed with asthma, she is questioning whether she has COPD.  She states she does not smoke, but her husband and son both smoke outside the house.   HPI  Past Medical History:  Diagnosis Date  . Acute renal failure ([place]) 05/27/10   hemodialysis for 6 weeks  . Anxiety   . Asthma   . Depression   . Diabetes mellitus   . Hypertension   . Rhabdomyolysis 06/06/10   after drug overdose  . Suicide attempt by drug ingestion ([place]) 06/05/10   result rhabdomyolosis and ARF requrining dialysis     Patient Active Problem List   Diagnosis Date Noted  . Asthma exacerbation 03/14/2017  . Weight gain 12/06/2016  . Upper airway cough syndrome 02/22/2016  . 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 Essential hypertension   . Vitamin D deficiency 04/11/2015  . Fatigue 04/10/2015  . Non  compliance w medication regimen 04/10/2015  . Golfer's elbow 12/22/2014  . Type 2 diabetes mellitus with complication ([place])   . Thyroid activity decreased   . Adjustment disorder with mixed anxiety and depressed mood   . Cough 09/20/2014  . Asthma with acute exacerbation 06/22/2014  . Grief at loss of child 06/22/2014  . Dysmenorrhea 11/25/2013  . Frequent urination 10/20/2012  . Furuncle 07/07/2012  . Suicidal ideation 05/27/2011  . Chronic kidney disease (CKD), stage III (moderate) ([place]) 11/06/2010  . ENDOMETRIAL HYPERPLASIA UNSPECIFIED 02/07/2008  . MENORRHAGIA 01/03/2008  . Allergic rhinitis 06/24/2007  . Essential hypertension, benign 02/03/2007  . Hypothyroidism 05/07/2006  . Type II diabetes mellitus with complication ([place]) 69/62/9528  . Morbid obesity ([place]) 05/07/2006  . Iron deficiency anemia 05/07/2006  . Anxiety state 05/07/2006  . OBSESSIVE COMPUL. DISORDER 05/07/2006    Past Surgical History:  Procedure Laterality Date  . ANKLE ARTHROSCOPY Right 08/17/2015   Procedure: RIGHT ANKLE ARTHROSCOPY WITH SYNOVECTOMY AND LOOSE BODY EXCISION;  Surgeon: Melrose Nakayama, MD;  Location: [facility];  Service: Orthopedics;  Laterality: Right;  . CHOLECYSTECTOMY    . TUBAL LIGATION  1998    OB History    No data available       Home Medications    Prior to Admission medications   Medication Sig Start Date End Date Taking? Authorizing Provider  albuterol (PROVENTIL HFA;VENTOLIN HFA) 108 (90 Base) MCG/ACT inhaler Inhale 2 puffs into the lungs every 6 (  six) hours as needed for wheezing or shortness of breath.   Yes [provider]  amLODipine (NORVASC) 10 MG tablet Take 1 tablet (10 mg total) by mouth daily. 12/04/16  Yes Verner Mould, MD  aspirin EC 81 MG tablet Take 81 mg by mouth daily.   Yes [provider]  busPIRone (BUSPAR) 10 MG tablet Take 10 mg by mouth 3 (three) times daily.    Yes [provider]  Cariprazine HCl 3 MG CAPS Take 3 mg  by mouth daily.   Yes [provider]  cloNIDine (CATAPRES) 0.1 MG tablet Take 0.1 mg by mouth daily. 02/12/16  Yes [provider]  docusate sodium (COLACE) 100 MG capsule Take 200 mg by mouth daily as needed.    Yes [provider]  famotidine (PEPCID) 20 MG tablet One at bedtime Patient taking differently: Take 20 mg by mouth at bedtime. One at bedtime 12/04/16  Yes Verner Mould, MD  ferrous sulfate 325 (65 FE) MG tablet Take 1 tablet (325 mg total) by mouth 3 (three) times daily with meals. Patient taking differently: Take 325 mg by mouth daily. Take every other day with stool softer 12/04/16  Yes Verner Mould, MD  FLUoxetine (PROZAC) 40 MG capsule Take 80 mg by mouth daily.    Yes [provider]  fluticasone (FLONASE) 50 MCG/ACT nasal spray Place 2 sprays into both nostrils daily. 12/04/16  Yes Verner Mould, MD  ipratropium-albuterol (DUONEB) 0.5-2.5 (3) MG/3ML SOLN Take 3 mLs by nebulization 3 (three) times daily as needed. 04/10/16  Yes McKeag, Marylynn Pearson, MD  levothyroxine (SYNTHROID, LEVOTHROID) 300 MCG tablet Take 1 tablet (300 mcg total) by mouth daily. 12/04/16  Yes Verner Mould, MD  LORazepam (ATIVAN) 1 MG tablet Take 1 tablet (1 mg total) by mouth every 8 (eight) hours as needed for anxiety. Do not fill <30 days from last refill 12/04/16  Yes Verner Mould, MD  mirtazapine (REMERON) 15 MG tablet Take 15 mg by mouth daily. 03/09/17  Yes [provider]  OLANZapine (ZYPREXA) 5 MG tablet Take 5 mg by mouth daily. 02/12/16  Yes [provider]  pantoprazole (PROTONIX) 40 MG tablet Take 1 tablet (40 mg total) by mouth daily. Take 30-60 min before first meal of the day 12/04/16  Yes Verner Mould, MD  sitaGLIPtin (JANUVIA) 100 MG tablet Take 1 tablet (100 mg total) by mouth daily. 03/06/17  Yes Verner Mould, MD  Vitamin D, Ergocalciferol, (DRISDOL) 50000 units CAPS  capsule Take 1 capsule (50,000 Units total) by mouth every 7 (seven) days. Sunday 12/04/16  Yes Verner Mould, MD  cyclobenzaprine (FLEXERIL) 10 MG tablet Take 1 tablet (10 mg total) 2 (two) times daily as needed by mouth for muscle spasms. Patient not taking: Reported on 03/14/2017 01/23/17   Fransico Meadow, PA-C  glimepiride (AMARYL) 4 MG tablet Take 2 tablets (8 mg total) by mouth daily before breakfast. Patient not taking: Reported on 03/14/2017 12/04/16   Verner Mould, MD  HYDROcodone-acetaminophen (NORCO/VICODIN) 5-325 MG tablet Take 2 tablets every 4 (four) hours as needed by mouth. Patient not taking: Reported on 03/14/2017 01/23/17   Fransico Meadow, PA-C  LORazepam (ATIVAN) 1 MG tablet Take 1 tablet (1 mg total) by mouth 3 (three) times daily as needed for anxiety. Do not fill before 30 days after last refill. Patient not taking: Reported on 03/14/2017 12/04/16   Verner Mould, MD  LORazepam (ATIVAN) 1 MG  tablet Take 1 tablet (1 mg total) by mouth 3 (three) times daily as needed for anxiety. Do not fill before 30 days after last refill. Patient not taking: Reported on 03/14/2017 12/04/16   Verner Mould, MD  LORazepam (ATIVAN) 1 MG tablet Take 1 tablet (1 mg total) by mouth every 8 (eight) hours as needed for anxiety. Do not fill <30 days from last refill Patient not taking: Reported on 03/14/2017 03/06/17   Verner Mould, MD  predniSONE (STERAPRED UNI-PAK 21 TAB) 10 MG (21) TBPK tablet Take 1 tablet (10 mg total) by mouth daily. Take 6 tabs by mouth daily  for 2 days, then 5 tabs for 2 days, then 4 tabs for 2 days, then 3 tabs for 2 days, 2 tabs for 2 days, then 1 tab by mouth daily for 2 days Patient not taking: Reported on 03/14/2017 04/21/16   Jeannett Senior, PA-C    Family History Family History  Problem Relation Age of Onset  . Asthma Father     Social History Social History   Tobacco Use  . Smoking status: Never Smoker  .  Smokeless tobacco: Never Used  Substance Use Topics  . Alcohol use: No  . Drug use: No     Allergies   Ace inhibitors; Haldol [haloperidol lactate]; Metformin and related; Nsaids; and Latex   Review of Systems Review of Systems  Respiratory: Positive for cough, chest tightness and shortness of breath.   All other systems reviewed and are negative.    Physical Exam Updated Vital Signs BP 135/80   Pulse (!) 117   Temp 98.9 F (37.2 C) (Rectal)   Resp (!) 22   Ht 6\' 2"  (1.88 m)   Wt (!) 187.8 kg (414 lb)   SpO2 94%   BMI 53.15 kg/m   Physical Exam  Constitutional: She is oriented to person, place, and time. She appears well-developed and well-nourished. No distress.  HENT:  Head: Normocephalic and atraumatic.  Eyes: EOM are normal.  Neck: Normal range of motion.  Cardiovascular: Normal rate, regular rhythm and intact distal pulses.  Pulmonary/Chest: Effort normal. No accessory muscle usage. No respiratory distress. She has wheezes. She has no rhonchi. She has no rales.  Speaking full sentences without difficulty.  Wheezing in all fields.  No accessory muscle use.  Abdominal: Soft. She exhibits no distension and no mass. There is no tenderness. There is no rebound and no guarding.  Musculoskeletal: Normal range of motion. She exhibits no edema or tenderness.  No leg pain or swelling  Neurological: She is alert and oriented to person, place, and time.  Skin: Skin is warm and dry.  Psychiatric: She has a normal mood and affect.  Nursing note and vitals reviewed.    ED Treatments / Results  Labs (all labs ordered are listed, but only abnormal results are displayed) Labs Reviewed  COMPREHENSIVE METABOLIC PANEL - Abnormal; Notable for the following components:      Result Value   Sodium 134 (*)    Glucose, Bld 358 (*)    Creatinine, Ser 1.48 (*)    Total Protein 8.4 (*)    GFR calc non Af Amer 41 (*)    GFR calc Af Amer 48 (*)    All other components within normal  limits  CBC WITH DIFFERENTIAL/PLATELET - Abnormal; Notable for the following components:   Hemoglobin 10.8 (*)    HCT 35.5 (*)    MCH 25.7 (*)    Platelets 503 (*)  All other components within normal limits  TROPONIN I - Abnormal; Notable for the following components:   Troponin I 0.09 (*)    All other components within normal limits  I-STAT CG4 LACTIC ACID, ED - Abnormal; Notable for the following components:   Lactic Acid, Venous 3.07 (*)    All other components within normal limits  I-STAT CG4 LACTIC ACID, ED - Abnormal; Notable for the following components:   Lactic Acid, Venous 2.48 (*)    All other components within normal limits  CULTURE, BLOOD (ROUTINE X 2)  CULTURE, BLOOD (ROUTINE X 2)  RESPIRATORY PANEL BY PCR  BRAIN NATRIURETIC PEPTIDE  INFLUENZA PANEL BY PCR (TYPE A & B)  URINALYSIS, ROUTINE W REFLEX MICROSCOPIC  HIV ANTIBODY (ROUTINE TESTING)  BASIC METABOLIC PANEL  CBC  I-STAT BETA HCG BLOOD, ED ([facility], [facility], [facility] ONLY)    EKG  EKG Interpretation  Date/Time:  Saturday March 14 2017 13:36:11 EST Ventricular Rate:  118 PR Interval:  146 QRS Duration: 78 QT Interval:  330 QTC Calculation: 462 R Axis:   83 Text Interpretation:  Sinus tachycardia Otherwise normal ECG Since last tracing rate faster Confirmed by Daleen Bo [phone]) on 03/14/2017 6:23:47 PM       Radiology Dg Chest 2 View  Result Date: 03/14/2017 CLINICAL DATA:  Shortness of breath for 2 days. EXAM: CHEST  2 VIEW COMPARISON:  01/23/2017 and prior radiographs FINDINGS: The cardiomediastinal silhouette is unremarkable. Mild peribronchial thickening is unchanged. There is no evidence of focal airspace disease, pulmonary edema, suspicious pulmonary nodule/mass, pleural effusion, or pneumothorax. No acute bony abnormalities are identified. IMPRESSION: No active cardiopulmonary disease. Electronically Signed   By: Margarette Canada M.D.   On: 03/14/2017 14:52    Procedures Procedures (including critical care  time)  Medications Ordered in ED Medications  LORazepam (ATIVAN) tablet 1 mg (not administered)  amLODipine (NORVASC) tablet 10 mg (not administered)  busPIRone (BUSPAR) tablet 10 mg (not administered)  cariprazine (VRAYLAR) capsule 3 mg (not administered)  cloNIDine (CATAPRES) tablet 0.1 mg (not administered)  famotidine (PEPCID) tablet 20 mg (not administered)  FLUoxetine (PROZAC) capsule 80 mg (not administered)  ferrous sulfate tablet 325 mg (not administered)  levothyroxine (SYNTHROID, LEVOTHROID) tablet 300 mcg (not administered)  pantoprazole (PROTONIX) EC tablet 40 mg (not administered)  mirtazapine (REMERON) tablet 15 mg (not administered)  enoxaparin (LOVENOX) injection 40 mg (not administered)  acetaminophen (TYLENOL) tablet 650 mg (not administered)    Or  acetaminophen (TYLENOL) suppository 650 mg (not administered)  polyethylene glycol (MIRALAX / GLYCOLAX) packet 17 g (not administered)  ondansetron (ZOFRAN) tablet 4 mg (not administered)    Or  ondansetron (ZOFRAN) injection 4 mg (not administered)  ipratropium-albuterol (DUONEB) 0.5-2.5 (3) MG/3ML nebulizer solution 3 mL (not administered)  albuterol (PROVENTIL) (2.5 MG/3ML) 0.083% nebulizer solution 2.5 mg (not administered)  montelukast (SINGULAIR) tablet 10 mg (not administered)  insulin aspart (novoLOG) injection 0-20 Units (not administered)  predniSONE (DELTASONE) tablet 60 mg (not administered)  albuterol (PROVENTIL) (2.5 MG/3ML) 0.083% nebulizer solution 5 mg (5 mg Nebulization Given 03/14/17 1351)  predniSONE (DELTASONE) tablet 60 mg (60 mg Oral Given 03/14/17 1415)  ipratropium-albuterol (DUONEB) 0.5-2.5 (3) MG/3ML nebulizer solution 3 mL (3 mLs Nebulization Given 03/14/17 1415)  ipratropium-albuterol (DUONEB) 0.5-2.5 (3) MG/3ML nebulizer solution 3 mL (3 mLs Nebulization Given 03/14/17 1448)  ipratropium-albuterol (DUONEB) 0.5-2.5 (3) MG/3ML nebulizer solution 3 mL (3 mLs Nebulization Given 03/14/17 1657)  magnesium  sulfate IVPB 2 g 50 mL (0 g Intravenous Stopped 03/14/17 1811)  sodium chloride 0.9 % bolus 1,000 mL (0 mLs Intravenous Stopped 03/14/17 1811)    And  sodium chloride 0.9 % bolus 1,000 mL (0 mLs Intravenous Stopped 03/14/17 1842)    And  sodium chloride 0.9 % bolus 1,000 mL (0 mLs Intravenous Stopped 03/14/17 2024)    And  sodium chloride 0.9 % bolus 1,000 mL (0 mLs Intravenous Stopped 03/14/17 2035)    And  sodium chloride 0.9 % bolus 1,000 mL (0 mLs Intravenous Stopped 03/14/17 2035)  ipratropium-albuterol (DUONEB) 0.5-2.5 (3) MG/3ML nebulizer solution 3 mL (3 mLs Nebulization Given 03/14/17 2036)     Initial Impression / Assessment and Plan / ED Course  I have reviewed the triage vital signs and the nursing notes.  Pertinent labs & imaging results that were available during my care of the patient were reviewed by me and considered in my medical decision making (see chart for details).     Pt presenting with shortness of breath, cough, and wheezing.  Physical exam shows patient is tachycardic and initially tachypneic.  Audible wheezing in all fields.  She is not hypotensive and she is afebrile.  O2 sats reassuring.  No increased work of breathing or signs of respiratory distress at this time.  Will give another breathing treatment and magnesium and reassess.  CBC and CMP reassuring, no leukocytosis.  Lactate elevated, however at this time, doubt sepsis.  Will start IV fluids and hold on antibiotics.  Tachycardia possibly due to albuterol.  X-ray negative.  EKG reassuring.   After treatment, patient has improved lung sounds and improved breathing. No tachypnea. She reports she is still feeling short of breath.  She is concerned this will continue when she goes home.  Case discussed with attending, Dr. Eulis Foster evaluated the patient.  At this time, doubt PE or ACS. Likely asthma exacerbation.   Patient ambulated in hall, O2 sat started at 95, decreased to 92 while walking.  Patient had increased shortness  of breath and work of breathing while walking.  Will consult with family medicine for admission due to asthma exacerbation.   Discussed with family medicine, patient to be admitted.   Final Clinical Impressions(s) / ED Diagnoses   Final diagnoses:  Exacerbation of asthma, unspecified asthma severity, unspecified whether persistent    ED Discharge Orders    None       Franchot Heidelberg, PA-C 03/14/17 2058    Daleen Bo, MD 03/14/17 2310

## 2017-03-14 NOTE — ED Notes (Signed)
Oxygen saturation 95% HR 124 after pt ambulated to BR and back to room.  Labored breathing, audible wheezing.

## 2017-03-14 NOTE — H&P (Signed)
[facility] Admission History and Physical Service Pager: [phone]  Patient name: [name] Medical record number: [id number] Date of birth: 26-May-1970 Age: 47 y.o. Gender: female  Primary Care Provider: Verner Mould, MD Consultants: None  Code Status: Full  Chief Complaint: SOB  Assessment and Plan: [name] is a 47 y.o. female presenting with asthma exacerbation . PMH is significant for MDD, Bipolar 1 Disorder, HTN, Asthma, CKD3, T2DM, Hypothyriodism   SOB likely 2/2 an Asthma exacerbation. Possibly some component of OHS given BMI of 53. Patient has had 3 days of worsening wheezing and shortness of breath.  Before coming into the hospital she administered albuterol inhaler 3 times at 2 puffs.  In the ED she remained wheezy after 3 DuoNeb treatments, mag and prednisone, sats of 96% on room air  EKG with sinus tachycardia, troponin 0.09, no chest pain likely patient with demand ischemic given she tachycardic. Wells criteria for PE 1.5 due to tachycardia, though most consistent with breathing treatments.  No history of fevers or chills, normal white blood cell count, does endorse cough without significant sputum production however this is chronic in nature and likely due to patient's asthma chest x-ray negative for any signs of pneumonia, therefore unlikely infectious in nature - Admit to telemetry Dr. Andria Frames -Duonebs every 4, Levalbuterol as needed  -Prednisone 60 daily -Continuous pulse ox -Obtain ABG  -Vitals per floor -Trend troponin's  - AM EKG  - Given presence of demand ischemia will consult cardiology in the AM   Lactic Acidosis: Well appearing, no concern for infectious symptoms as above. Most likely consistent with asthma exacerbation cause . Has been noted that rarely albuterol can cause lactic acidosis. Received 30 ml/kg of fluids resuscitation in the ED.  - Will provide 1 L bolus  - Trend lactic acid   - Obtain ABG    Sinus congestion: 3 weeks duration, indicates having long standing nasal congestion for several years  - Will start singular 10 mg  - flonase for sinus congestin   Asthma: Currently only has albuterol inhaler as needed and DuoNeb at home.  She does not use her DuoNeb very often.  Evaluated by Dr. Melvyn Novas in 2017 he indicated that he thought her symptoms were likely upper airway cough syndrome rather than asthma at the time - planned to trial her off pulimcort for 2 weeks with  Max reflux treatment   - Continue for now as asthma exacerbation  - Needs to continue outpatient follow up with pulmonology  - Needs sleep study moving forward   Bipolar 1 disorder/Anxiety/MDD: [facility]s with [facility].  Currently on clonidine, BuSpar, Prozac, Remeron, Vyrlar.  Receives Ativan as needed from family medicine clinic - Continue clonidine, BuSpar, Prozac, Remeron, Vyrlar - Continue Ativan 1 mg prn TID   T2DM: HA1C 10.4 on 03/06/2017. Patient states her blood sugars are usually in 200-300s. Home glimepiride 8mg , Januvia 100 mg  (started on 03/06/2017) - Resistant SSI for now  - Consider restarting home [place] tomorrow   HTN : Hypertensive on admission now normotensive  - Continue Norvasc and Clonidine   CKD, Stage 3: Scr 1.38, baseline 1.4  - Continue to follow BMET   Hypothyroidism: TSH 3.2 in 2017  - Obtain TSH  FEN/GI: Diet Carb Modified  Prophylaxis: Lovenox   Disposition: Home   History of Present Illness:  [name] is a 47 y.o. female presenting with SOB. Patient has been having worsening SOB since Thursday. Patient states  that despite home breath treatments (has used it 3 times prior to coming to ED) she has not improved signficantly. Patient has been having worsening wheezing over the past couple of days. Patient states she became winded going to the bathroom today. She indicates having a chronic cough, most recently dry in nature.  Denies any significant symptoms of leg  swelling.  Patient indicates she has seen pulmonology last year and they thought that her symptoms were likely due to reflux.  She has never been evaluated for OSA.  Review Of Systems: Per HPI with the following additions:   Review of Systems  Constitutional: Negative for chills and fever.  HENT: Positive for congestion.   Respiratory: Positive for cough, shortness of breath and wheezing. Negative for sputum production.   Cardiovascular: Negative for chest pain and palpitations.  Gastrointestinal: Negative for nausea and vomiting.  Genitourinary: Negative for dysuria.  Musculoskeletal: Negative for myalgias.  Neurological: Negative for dizziness and headaches.    Patient Active Problem List   Diagnosis Date Noted  . Asthma exacerbation 03/14/2017  . Weight gain 12/06/2016  . Upper airway cough syndrome 02/22/2016  . MDD (major depressive disorder), recurrent severe, without psychosis ([place]) 05/11/2015  . Essential hypertension   . Vitamin D deficiency 04/11/2015  . Fatigue 04/10/2015  . Non compliance w medication regimen 04/10/2015  . Golfer's elbow 12/22/2014  . Type 2 diabetes mellitus with complication ([place])   . Thyroid activity decreased   . Adjustment disorder with mixed anxiety and depressed mood   . Cough 09/20/2014  . Asthma with acute exacerbation 06/22/2014  . Grief at loss of child 06/22/2014  . Dysmenorrhea 11/25/2013  . Frequent urination 10/20/2012  . Furuncle 07/07/2012  . Suicidal ideation 05/27/2011  . Chronic kidney disease (CKD), stage III (moderate) ([place]) 11/06/2010  . ENDOMETRIAL HYPERPLASIA UNSPECIFIED 02/07/2008  . MENORRHAGIA 01/03/2008  . Allergic rhinitis 06/24/2007  . Essential hypertension, benign 02/03/2007  . Hypothyroidism 05/07/2006  . Type II diabetes mellitus with complication ([place]) 01/00/7121  . Morbid obesity ([place]) 05/07/2006  . Iron deficiency anemia 05/07/2006  . Anxiety state 05/07/2006  . OBSESSIVE COMPUL. DISORDER 05/07/2006     Past Medical History: Past Medical History:  Diagnosis Date  . Acute renal failure ([place]) 05/27/10   hemodialysis for 6 weeks  . Anxiety   . Asthma   . Depression   . Diabetes mellitus   . Hypertension   . Rhabdomyolysis 06/06/10   after drug overdose  . Suicide attempt by drug ingestion ([place]) 06/05/10   result rhabdomyolosis and ARF requrining dialysis     Past Surgical History: Past Surgical History:  Procedure Laterality Date  . ANKLE ARTHROSCOPY Right 08/17/2015   Procedure: RIGHT ANKLE ARTHROSCOPY WITH SYNOVECTOMY AND LOOSE BODY EXCISION;  Surgeon: Melrose Nakayama, MD;  Location: [facility];  Service: Orthopedics;  Laterality: Right;  . CHOLECYSTECTOMY    . TUBAL LIGATION  1998    Social History: Social History   Tobacco Use  . Smoking status: Never Smoker  . Smokeless tobacco: Never Used  Substance Use Topics  . Alcohol use: No  . Drug use: No   Additional social history:  Please also refer to relevant sections of EMR.  Family History: Family History  Problem Relation Age of Onset  . Asthma Father    Allergies and Medications: Allergies  Allergen Reactions  . Ace Inhibitors Other (See Comments)    Patient had acute renal failure requiring hemodialysis after a suicide attempt.  Nephro recommended avoiding use.  Marland Kitchen  Haldol [Haloperidol Lactate] Other (See Comments)    Tardive dyskinesia  . Metformin And Related Other (See Comments)    Used in her suicide attempt that contributed to severe acute renal failure  . Nsaids Other (See Comments)    Nephro recommended avoiding after acute renal failure requiring hemodialysis associated with a suicide attempt.  . Latex Other (See Comments)   No current facility-administered medications on file prior to encounter.    Current Outpatient Medications on File Prior to Encounter  Medication Sig Dispense Refill  . albuterol (PROVENTIL HFA;VENTOLIN HFA) 108 (90 Base) MCG/ACT inhaler Inhale 2 puffs into the lungs every 6 (six)  hours as needed for wheezing or shortness of breath.    Marland Kitchen amLODipine (NORVASC) 10 MG tablet Take 1 tablet (10 mg total) by mouth daily. 90 tablet 3  . aspirin EC 81 MG tablet Take 81 mg by mouth daily.    . busPIRone (BUSPAR) 10 MG tablet Take 10 mg by mouth 3 (three) times daily.     . Cariprazine HCl 3 MG CAPS Take 3 mg by mouth daily.    . cloNIDine (CATAPRES) 0.1 MG tablet Take 0.1 mg by mouth daily.    Marland Kitchen docusate sodium (COLACE) 100 MG capsule Take 200 mg by mouth daily as needed.     . famotidine (PEPCID) 20 MG tablet One at bedtime (Patient taking differently: Take 20 mg by mouth at bedtime. One at bedtime) 30 tablet 2  . ferrous sulfate 325 (65 FE) MG tablet Take 1 tablet (325 mg total) by mouth 3 (three) times daily with meals. (Patient taking differently: Take 325 mg by mouth daily. Take every other day with stool softer) 90 tablet 3  . FLUoxetine (PROZAC) 40 MG capsule Take 80 mg by mouth daily.     . fluticasone (FLONASE) 50 MCG/ACT nasal spray Place 2 sprays into both nostrils daily. 16 g 2  . ipratropium-albuterol (DUONEB) 0.5-2.5 (3) MG/3ML SOLN Take 3 mLs by nebulization 3 (three) times daily as needed. 360 mL 0  . levothyroxine (SYNTHROID, LEVOTHROID) 300 MCG tablet Take 1 tablet (300 mcg total) by mouth daily. 90 tablet 3  . LORazepam (ATIVAN) 1 MG tablet Take 1 tablet (1 mg total) by mouth every 8 (eight) hours as needed for anxiety. Do not fill <30 days from last refill 90 tablet 0  . mirtazapine (REMERON) 15 MG tablet Take 15 mg by mouth daily.    Marland Kitchen OLANZapine (ZYPREXA) 5 MG tablet Take 5 mg by mouth daily.    . pantoprazole (PROTONIX) 40 MG tablet Take 1 tablet (40 mg total) by mouth daily. Take 30-60 min before first meal of the day 90 tablet 2  . sitaGLIPtin (JANUVIA) 100 MG tablet Take 1 tablet (100 mg total) by mouth daily. 30 tablet 1  . Vitamin D, Ergocalciferol, (DRISDOL) 50000 units CAPS capsule Take 1 capsule (50,000 Units total) by mouth every 7 (seven) days. Sunday  12 capsule 0  . cyclobenzaprine (FLEXERIL) 10 MG tablet Take 1 tablet (10 mg total) 2 (two) times daily as needed by mouth for muscle spasms. (Patient not taking: Reported on 03/14/2017) 20 tablet 0  . glimepiride (AMARYL) 4 MG tablet Take 2 tablets (8 mg total) by mouth daily before breakfast. (Patient not taking: Reported on 03/14/2017) 180 tablet 0  . HYDROcodone-acetaminophen (NORCO/VICODIN) 5-325 MG tablet Take 2 tablets every 4 (four) hours as needed by mouth. (Patient not taking: Reported on 03/14/2017) 16 tablet 0  . LORazepam (ATIVAN) 1 MG tablet  Take 1 tablet (1 mg total) by mouth 3 (three) times daily as needed for anxiety. Do not fill before 30 days after last refill. (Patient not taking: Reported on 03/14/2017) 90 tablet 0  . LORazepam (ATIVAN) 1 MG tablet Take 1 tablet (1 mg total) by mouth 3 (three) times daily as needed for anxiety. Do not fill before 30 days after last refill. (Patient not taking: Reported on 03/14/2017) 90 tablet 0  . LORazepam (ATIVAN) 1 MG tablet Take 1 tablet (1 mg total) by mouth every 8 (eight) hours as needed for anxiety. Do not fill <30 days from last refill (Patient not taking: Reported on 03/14/2017) 90 tablet 0  . predniSONE (STERAPRED UNI-PAK 21 TAB) 10 MG (21) TBPK tablet Take 1 tablet (10 mg total) by mouth daily. Take 6 tabs by mouth daily  for 2 days, then 5 tabs for 2 days, then 4 tabs for 2 days, then 3 tabs for 2 days, 2 tabs for 2 days, then 1 tab by mouth daily for 2 days (Patient not taking: Reported on 03/14/2017) 42 tablet 1    Objective: BP 135/80   Pulse (!) 117   Temp 98.9 F (37.2 C) (Rectal)   Resp (!) 22   Ht 6\' 2"  (1.88 m)   Wt (!) 414 lb (187.8 kg)   SpO2 94%   BMI 53.15 kg/m  Exam: Physical Exam  Constitutional: She is oriented to person, place, and time and well-developed, well-nourished, and in no distress.  HENT:  Head: Normocephalic and atraumatic.  Eyes: Conjunctivae are normal. Pupils are equal, round, and reactive to light.  Neck:  Normal range of motion. Neck supple.  Cardiovascular: Normal rate, regular rhythm and normal heart sounds.  Pulmonary/Chest:  Slight Tachypenic, Sat 96% on room, wheezing throughout on physical exam   Abdominal: Soft. Bowel sounds are normal.  Musculoskeletal: Normal range of motion.  Neurological: She is alert and oriented to person, place, and time.  Skin: Skin is warm.  Psychiatric:  Very anxious     Labs and Imaging: CBC BMET  Recent Labs  Lab 03/14/17 1352  WBC 8.0  HGB 10.8*  HCT 35.5*  PLT 503*   Recent Labs  Lab 03/14/17 1352  NA 134*  K 4.6  CL 101  CO2 24  BUN 14  CREATININE 1.48*  GLUCOSE 358*  CALCIUM 9.1     Dg Chest 2 View  Result Date: 03/14/2017 CLINICAL DATA:  Shortness of breath for 2 days. EXAM: CHEST  2 VIEW COMPARISON:  01/23/2017 and prior radiographs FINDINGS: The cardiomediastinal silhouette is unremarkable. Mild peribronchial thickening is unchanged. There is no evidence of focal airspace disease, pulmonary edema, suspicious pulmonary nodule/mass, pleural effusion, or pneumothorax. No acute bony abnormalities are identified. IMPRESSION: No active cardiopulmonary disease. Electronically Signed   By: Margarette Canada M.D.   On: 03/14/2017 14:52     Tonette Bihari, MD 03/14/2017, 10:14 PM PGY-3 , [place] Intern pager: [phone], text pages welcome

## 2017-03-14 NOTE — ED Triage Notes (Signed)
Pt states sob and cough x 1 week.  Has been using nebulizers and robitussin without relief.  States has had 2 episodes where she coughed so hard that she blacked out momentarily.

## 2017-03-14 NOTE — ED Notes (Signed)
No blood draw,   Pt not in the room.

## 2017-03-15 ENCOUNTER — Other Ambulatory Visit: Payer: Self-pay

## 2017-03-15 ENCOUNTER — Inpatient Hospital Stay (HOSPITAL_COMMUNITY): Payer: BC Managed Care – PPO

## 2017-03-15 ENCOUNTER — Encounter (HOSPITAL_COMMUNITY): Payer: Self-pay | Admitting: *Deleted

## 2017-03-15 DIAGNOSIS — R0602 Shortness of breath: Secondary | ICD-10-CM

## 2017-03-15 DIAGNOSIS — J189 Pneumonia, unspecified organism: Secondary | ICD-10-CM

## 2017-03-15 DIAGNOSIS — J45901 Unspecified asthma with (acute) exacerbation: Secondary | ICD-10-CM

## 2017-03-15 DIAGNOSIS — I10 Essential (primary) hypertension: Secondary | ICD-10-CM

## 2017-03-15 LAB — BASIC METABOLIC PANEL
Anion gap: 10 (ref 5–15)
BUN: 17 mg/dL (ref 6–20)
CALCIUM: 8.7 mg/dL — AB (ref 8.9–10.3)
CO2: 19 mmol/L — ABNORMAL LOW (ref 22–32)
CREATININE: 1.46 mg/dL — AB (ref 0.44–1.00)
Chloride: 104 mmol/L (ref 101–111)
GFR calc Af Amer: 48 mL/min — ABNORMAL LOW (ref >60)
GFR, EST NON AFRICAN AMERICAN: 42 mL/min — AB (ref >60)
Glucose, Bld: 478 mg/dL — ABNORMAL HIGH (ref 65–99)
Potassium: 4.7 mmol/L (ref 3.5–5.1)
Sodium: 133 mmol/L — ABNORMAL LOW (ref 135–145)

## 2017-03-15 LAB — RESPIRATORY PANEL BY PCR
ADENOVIRUS-RVPPCR: NOT DETECTED
Bordetella pertussis: NOT DETECTED
CHLAMYDOPHILA PNEUMONIAE-RVPPCR: NOT DETECTED
CORONAVIRUS 229E-RVPPCR: NOT DETECTED
CORONAVIRUS NL63-RVPPCR: NOT DETECTED
CORONAVIRUS OC43-RVPPCR: NOT DETECTED
Coronavirus HKU1: NOT DETECTED
INFLUENZA B-RVPPCR: NOT DETECTED
Influenza A: NOT DETECTED
MYCOPLASMA PNEUMONIAE-RVPPCR: NOT DETECTED
Metapneumovirus: NOT DETECTED
PARAINFLUENZA VIRUS 1-RVPPCR: NOT DETECTED
Parainfluenza Virus 2: NOT DETECTED
Parainfluenza Virus 3: NOT DETECTED
Parainfluenza Virus 4: NOT DETECTED
Respiratory Syncytial Virus: NOT DETECTED
Rhinovirus / Enterovirus: NOT DETECTED

## 2017-03-15 LAB — CBC
HCT: 35.5 % — ABNORMAL LOW (ref 36.0–46.0)
Hemoglobin: 10.8 g/dL — ABNORMAL LOW (ref 12.0–15.0)
MCH: 26 pg (ref 26.0–34.0)
MCHC: 30.4 g/dL (ref 30.0–36.0)
MCV: 85.5 fL (ref 78.0–100.0)
Platelets: 542 10*3/uL — ABNORMAL HIGH (ref 150–400)
RBC: 4.15 MIL/uL (ref 3.87–5.11)
RDW: 15.4 % (ref 11.5–15.5)
WBC: 10.9 10*3/uL — ABNORMAL HIGH (ref 4.0–10.5)

## 2017-03-15 LAB — HIV ANTIBODY (ROUTINE TESTING W REFLEX)
HIV SCREEN 4TH GENERATION: NONREACTIVE
HIV SCREEN 4TH GENERATION: NONREACTIVE

## 2017-03-15 LAB — GLUCOSE, CAPILLARY
GLUCOSE-CAPILLARY: 307 mg/dL — AB (ref 65–99)
GLUCOSE-CAPILLARY: 392 mg/dL — AB (ref 65–99)
Glucose-Capillary: 371 mg/dL — ABNORMAL HIGH (ref 65–99)
Glucose-Capillary: 357 mg/dL — ABNORMAL HIGH (ref 65–99)

## 2017-03-15 LAB — TROPONIN I
Troponin I: 0.05 ng/mL (ref <0.03)
Troponin I: 0.03 ng/mL (ref <0.03)

## 2017-03-15 LAB — ETHANOL

## 2017-03-15 LAB — ACETAMINOPHEN LEVEL

## 2017-03-15 LAB — D-DIMER, QUANTITATIVE (NOT AT ARMC)

## 2017-03-15 LAB — LACTIC ACID, PLASMA: LACTIC ACID, VENOUS: 3.6 mmol/L — AB (ref 0.5–1.9)

## 2017-03-15 MED ORDER — AMOXICILLIN-POT CLAVULANATE 875-125 MG PO TABS
1.0000 | ORAL_TABLET | Freq: Two times a day (BID) | ORAL | Status: DC
  Administered 2017-03-15: 1 via ORAL
  Filled 2017-03-15: qty 1

## 2017-03-15 MED ORDER — SODIUM CHLORIDE 0.9 % IV BOLUS (SEPSIS)
1000.0000 mL | Freq: Once | INTRAVENOUS | Status: AC
  Administered 2017-03-15: 1000 mL via INTRAVENOUS

## 2017-03-15 MED ORDER — INSULIN ASPART 100 UNIT/ML ~~LOC~~ SOLN
5.0000 [IU] | Freq: Once | SUBCUTANEOUS | Status: AC
  Administered 2017-03-15: 5 [IU] via SUBCUTANEOUS

## 2017-03-15 MED ORDER — AZITHROMYCIN 500 MG PO TABS
500.0000 mg | ORAL_TABLET | Freq: Every day | ORAL | Status: AC
  Administered 2017-03-15: 500 mg via ORAL
  Filled 2017-03-15: qty 1

## 2017-03-15 MED ORDER — INSULIN ASPART 100 UNIT/ML ~~LOC~~ SOLN
0.0000 [IU] | Freq: Three times a day (TID) | SUBCUTANEOUS | Status: DC
  Administered 2017-03-16: 11 [IU] via SUBCUTANEOUS
  Administered 2017-03-16: 15 [IU] via SUBCUTANEOUS
  Administered 2017-03-16: 11 [IU] via SUBCUTANEOUS
  Administered 2017-03-17: 20 [IU] via SUBCUTANEOUS
  Administered 2017-03-17 – 2017-03-18 (×4): 11 [IU] via SUBCUTANEOUS

## 2017-03-15 MED ORDER — TRAMADOL HCL 50 MG PO TABS
50.0000 mg | ORAL_TABLET | Freq: Two times a day (BID) | ORAL | Status: DC | PRN
  Administered 2017-03-15 – 2017-03-17 (×4): 50 mg via ORAL
  Filled 2017-03-15 (×4): qty 1

## 2017-03-15 MED ORDER — INSULIN ASPART 100 UNIT/ML ~~LOC~~ SOLN
15.0000 [IU] | Freq: Once | SUBCUTANEOUS | Status: AC
  Administered 2017-03-15: 15 [IU] via SUBCUTANEOUS

## 2017-03-15 MED ORDER — INSULIN ASPART 100 UNIT/ML ~~LOC~~ SOLN
0.0000 [IU] | Freq: Every day | SUBCUTANEOUS | Status: DC
  Administered 2017-03-15: 5 [IU] via SUBCUTANEOUS
  Administered 2017-03-17: 4 [IU] via SUBCUTANEOUS

## 2017-03-15 MED ORDER — AZITHROMYCIN 500 MG PO TABS
250.0000 mg | ORAL_TABLET | Freq: Every day | ORAL | Status: DC
  Administered 2017-03-16 – 2017-03-18 (×3): 250 mg via ORAL
  Filled 2017-03-15 (×3): qty 1

## 2017-03-15 MED ORDER — DEXTROSE 5 % IV SOLN
1.0000 g | INTRAVENOUS | Status: DC
  Administered 2017-03-15 – 2017-03-17 (×3): 1 g via INTRAVENOUS
  Filled 2017-03-15 (×4): qty 10

## 2017-03-15 NOTE — Discharge Summary (Signed)
[facility] Discharge Summary  Patient name: [name] Medical record number: [id number] Date of birth: 12-26-1970 Age: 47 y.o. Gender: female Date of Admission: 03/14/2017  Date of Discharge: 03/18/17 Admitting Physician: Zenia Resides, MD  Primary Care Provider: Verner Mould, MD Consultants: none  Indication for Hospitalization: asthma exacerbation, pneumonia  Discharge Diagnoses/Problem List:  Asthma Bipolar type I disorder Anxiety MDD Type 2 DM, poorly controlled HTN CKD III Hypothyroidism Morbid obesity  Disposition: home  Discharge Condition: stable, improved  Discharge Exam: please see progress note from day of discharge  Brief Hospital Course:  [name] was admitted on 1/5 for shortness of breath and wheezing thought to be likely due to an asthma exacerbation.  She was started on duonebs, Xopenex, and prednisone 60 mg daily at admission.  Her lactic acid was elevated to 3.6 on the night of admission, and a CXR was concerning for posterior lobe pneumonia, so azithromycin and ceftriaxone were started.  Lactic acid normalized to 1.7 on 1/7.  Pulmicort was added and Xopenex was increased in frequency due to patient's continued shortness of breath and bilateral wheezing on 1/7.  She continued to have a new oxygen requirement off and on, although her lowest recorded O2 saturation was 92 on room air.  Patient's anxiety was thought to be a component of her shortness of breath as well.  Due to patient's oral prednisone and poorly controlled T2DM, her CBGs were in the upper 400s with resistant sliding scale insulin, so 15 U Lantus was added on 1/7.  An echo performed on 1/7 showed an EF of 45-50%, which is borderline HFrEF.    Because of her uncontrolled T2DM, obesity, and echo result, we started her on some new medications for primary and secondary prevention of heart disease and kidney disease.  We also added a controller medication  (Advair) for her asthma.  Issues for Follow Up:  1. Her new medications include Advair, metformin, Lantus, metoprolol succinate, rosuvastatin, and lisinopril.  We have her on the middle strength dose of Advair and on 20 units of Lantus.  We started at the lowest dose for all of the other new medications, but you can titrate up as needed.   2. She has been on steroids while in the hospital, so her insulin requirement is likely to drop.  Even though she has had CBGs in the upper 300s while on 25 units Lantus, we reduced this dose to 20 U outpatient given her cessation of steroids.  This dose will likely be too low for her, so it would be beneficial for you to see her again in the next few weeks to titrate this.  We did not prescribe mealtime insulin since we wanted to see how she responded to the Lantus first. 3. She should be on her last dose of azithromycin for her CAP on 1/10.  This will finish a 5 day course. 4. She is currently taking Zyprexa, which likely is making her diabetes worse.  She sees a provider at [facility], and it may be helpful to message this provider to see if she would like to switch her to a different antipsychotic or discontinue Zyprexa altogether because of her comorbidities.  She is on another 2nd gen antipsychotic called Vraylar.  Significant Procedures: none  Significant Labs and Imaging:  Recent Labs  Lab 03/16/17 0710 03/17/17 0513 03/18/17 0539  WBC 11.9* 13.2* 15.1*  HGB 9.6* 9.4* 9.3*  HCT 32.4* 31.4* 31.3*  PLT 477* 470*  468*   Recent Labs  Lab 03/14/17 1352 03/15/17 0314 03/16/17 0710 03/17/17 0513 03/18/17 0539  NA 134* 133* 138 136 134*  K 4.6 4.7 4.6 4.6 4.4  CL 101 104 104 102 101  CO2 24 19* 26 24 22   GLUCOSE 358* 478* 288* 352* 332*  BUN 14 17 20  21* 22*  CREATININE 1.48* 1.46* 1.23* 1.30* 1.31*  CALCIUM 9.1 8.7* 8.7* 8.8* 8.9  ALKPHOS 67  --  52  --   --   AST 27  --  22  --   --   ALT 19  --  18  --   --   ALBUMIN 3.5  --  3.0*  --   --      Dg Chest 2 View  Result Date: 03/15/2017 CLINICAL DATA:  47 year old female with cough and shortness of breath for 3 days. EXAM: CHEST  2 VIEW COMPARISON:  03/14/2017 FINDINGS: Posterior lower lobe opacity on the lateral view probably represents airspace disease/ pneumonia. Cardiomediastinal silhouette is unchanged. No pleural effusion or pneumothorax. No acute bony abnormalities are present. IMPRESSION: Posterior lower lobe opacity on the lateral view is suggestive of airspace disease/pneumonia. Electronically Signed   By: Margarette Canada M.D.   On: 03/15/2017 10:56   Dg Chest 2 View  Result Date: 03/14/2017 CLINICAL DATA:  Shortness of breath for 2 days. EXAM: CHEST  2 VIEW COMPARISON:  01/23/2017 and prior radiographs FINDINGS: The cardiomediastinal silhouette is unremarkable. Mild peribronchial thickening is unchanged. There is no evidence of focal airspace disease, pulmonary edema, suspicious pulmonary nodule/mass, pleural effusion, or pneumothorax. No acute bony abnormalities are identified. IMPRESSION: No active cardiopulmonary disease. Electronically Signed   By: Margarette Canada M.D.   On: 03/14/2017 14:52    Results/Tests Pending at Time of Discharge: none  Discharge Medications:  Allergies as of 03/18/2017      Reactions   Ace Inhibitors Other (See Comments)   Patient had acute renal failure requiring hemodialysis after a suicide attempt.  Nephro recommended avoiding use.   Haldol [haloperidol Lactate] Other (See Comments)   Tardive dyskinesia   Metformin And Related Other (See Comments)   Used in her suicide attempt that contributed to severe acute renal failure   Nsaids Other (See Comments)   Nephro recommended avoiding after acute renal failure requiring hemodialysis associated with a suicide attempt.   Latex Other (See Comments)      Medication List    STOP taking these medications   glimepiride 4 MG tablet Commonly known as:  AMARYL   HYDROcodone-acetaminophen 5-325 MG  tablet Commonly known as:  NORCO/VICODIN   predniSONE 10 MG (21) Tbpk tablet Commonly known as:  STERAPRED UNI-PAK 21 TAB     TAKE these medications   albuterol 108 (90 Base) MCG/ACT inhaler Commonly known as:  PROVENTIL HFA;VENTOLIN HFA Inhale 2 puffs into the lungs every 6 (six) hours as needed for wheezing or shortness of breath.   amLODipine 10 MG tablet Commonly known as:  NORVASC Take 1 tablet (10 mg total) by mouth daily.   aspirin EC 81 MG tablet Take 81 mg by mouth daily.   azithromycin 250 MG tablet Commonly known as:  ZITHROMAX Take 1 tablet (250 mg total) by mouth daily. Start taking on:  03/19/2017   busPIRone 10 MG tablet Commonly known as:  BUSPAR Take 10 mg by mouth 3 (three) times daily.   cariprazine capsule Commonly known as:  VRAYLAR Take 3 mg by mouth daily.   cloNIDine 0.1  MG tablet Commonly known as:  CATAPRES Take 0.1 mg by mouth daily.   cyclobenzaprine 10 MG tablet Commonly known as:  FLEXERIL Take 1 tablet (10 mg total) 2 (two) times daily as needed by mouth for muscle spasms.   docusate sodium 100 MG capsule Commonly known as:  COLACE Take 200 mg by mouth daily as needed.   famotidine 20 MG tablet Commonly known as:  PEPCID One at bedtime What changed:    how much to take  how to take this  when to take this  additional instructions   ferrous sulfate 325 (65 FE) MG tablet Take 1 tablet (325 mg total) by mouth 3 (three) times daily with meals. What changed:    when to take this  additional instructions   FLUoxetine 40 MG capsule Commonly known as:  PROZAC Take 80 mg by mouth daily.   fluticasone 50 MCG/ACT nasal spray Commonly known as:  FLONASE Place 2 sprays into both nostrils daily.   fluticasone-salmeterol 115-21 MCG/ACT inhaler Commonly known as:  ADVAIR HFA Inhale 2 puffs into the lungs 2 (two) times daily.   insulin glargine 100 UNIT/ML injection Commonly known as:  LANTUS Inject 0.2 mLs (20 Units total)  into the skin daily. Start taking on:  03/19/2017   ipratropium-albuterol 0.5-2.5 (3) MG/3ML Soln Commonly known as:  DUONEB Take 3 mLs by nebulization 3 (three) times daily as needed.   levothyroxine 300 MCG tablet Commonly known as:  SYNTHROID, LEVOTHROID Take 1 tablet (300 mcg total) by mouth daily.   lisinopril 5 MG tablet Commonly known as:  PRINIVIL,ZESTRIL Take 1 tablet (5 mg total) by mouth daily. Start taking on:  03/19/2017   LORazepam 1 MG tablet Commonly known as:  ATIVAN Take 1 tablet (1 mg total) by mouth every 8 (eight) hours as needed for anxiety. Do not fill <30 days from last refill What changed:  Another medication with the same name was removed. Continue taking this medication, and follow the directions you see here.   metFORMIN 500 MG tablet Commonly known as:  GLUCOPHAGE Take 1 tablet (500 mg total) by mouth daily with breakfast.   metoprolol succinate 25 MG 24 hr tablet Commonly known as:  TOPROL XL Take 1 tablet (25 mg total) by mouth daily.   mirtazapine 15 MG tablet Commonly known as:  REMERON Take 15 mg by mouth daily.   OLANZapine 5 MG tablet Commonly known as:  ZYPREXA Take 5 mg by mouth daily.   pantoprazole 40 MG tablet Commonly known as:  PROTONIX Take 1 tablet (40 mg total) by mouth daily. Take 30-60 min before first meal of the day   rosuvastatin 10 MG tablet Commonly known as:  CRESTOR Take 1 tablet (10 mg total) by mouth at bedtime.   sitaGLIPtin 100 MG tablet Commonly known as:  JANUVIA Take 1 tablet (100 mg total) by mouth daily.   Vitamin D (Ergocalciferol) 50000 units Caps capsule Commonly known as:  DRISDOL Take 1 capsule (50,000 Units total) by mouth every 7 (seven) days. Sunday       Discharge Instructions: Please refer to Patient Instructions section of EMR for full details.  Patient was counseled important signs and symptoms that should prompt return to medical care, changes in medications, dietary instructions,  activity restrictions, and follow up appointments.   Follow-Up Appointments: Follow-up Information    [facility]. Go on 03/19/2017.   Why:  Go to appt at 8:30 AM. Please arrive 15 mins early with  meds. Contact information: [place] [phone]          Kathrene Alu, MD 03/18/2017, 11:43 AM PGY-1, [place]

## 2017-03-15 NOTE — Progress Notes (Signed)
Family Medicine Teaching Service Daily Progress Note Intern Pager: [phone]  Patient name: [name] Medical record number: [id number] Date of birth: 28-Jun-1970 Age: 47 y.o. Gender: female  Primary Care Provider: Verner Mould, MD Consultants: None Code Status: Full  Pt Overview and Major Events to Date:  01/05: Admit for hypoxia due to COPD exacerbation 01/06: CXR concerning for PNA  Assessment and Plan: [name] is a 47 y.o. female presenting with asthma exacerbation . PMH is significant for MDD, Bipolar 1 Disorder, HTN, Asthma, CKD3, T2DM, Hypothyriodism.  Acute hypoxic respiratory distress with lactic acidosis  Asthma, COPD exacerbation  SOB: Acute.  Stable but persistent.  Still requiring oxygen at 2L [place] as of this morning.  Continues to have diffuse wheezing but able to talk in complete sentences.  CXR c/w posterior lower lobe opacity suggestive of pneumonia.  Continues to have lactic acidosis despite fluid resuscitation.  Troponins mildly elevated, now plateaued.  EKG sinus tach without ST changes or T wave abnormalities, right axis deviation.  Suspect source of asthma exacerbation is due to recent viral URI due to history of congestion. - Telemetry and continuous pulse ox - Pending echocardiogram - DuoNeb every 4 hours, Xopenex nebulizer every 6 hours as needed, Singulair daily Flonase daily - Prednisone 60 mg daily, day 1 of 5 - Augmentin, day 1 of 5 - Supplemental oxygen, keep sats greater than 92%, incentive spirometry - Strict I&O's, daily weights  Bipolar type I disorder  Anxiety  MDD: Chronic.  Stable.  Home medications include: BuSpar, clonidine, Vraylar, Prozac, Zyprexa, Ativan.  Denies SI/HI.  Not appear manic. - Continuing home BuSpar 10 mg 3 times daily, Vraylar 3 mg daily, clonidine 0.1 mg daily, Prozac 80 mg daily, Zyprexa 5 mg daily, Ativan 1 mg 3 times daily as needed for anxiety  Non-insulin-dependent type 2 diabetes mellitus: Chronic.   Last A1c uncontrolled, 10.4 last month.  On Januvia and Amaryl at home.   - Holding home Januvia 100 mg daily and Amaryl 8 mg daily - Resistant sliding scale insulin  Hypertension: Chronic.  Remains normotensive. - Continue home Norvasc 10 mg daily and clonidine 0.1 mg daily  CKD stage III: Chronic.  Creatinine remains at baseline, 1.4. - Daily BMET - Avoid nephrotoxic agents  Hypothyroidism: Chronic.  TSH WNL on admission. - Continue home Synthroid 300 mcg daily  FEN/GI: Car modified diet Prophylaxis: Lovenox SQ  Disposition: Pending improvement of new O2 requirement due to COPD exacerbation and PNA.  Subjective:  Patient states she continues to have wheeze.  She feels the oxygen is helping and does not feel as short of breath.  She denies fevers or chills, nausea or vomiting, chest pain, palpitations, abdominal pain.  Objective: Temp:  [98.1 F (36.7 C)-99.6 F (37.6 C)] 98.1 F (36.7 C) (01/06 0954) Pulse Rate:  [102-124] 103 (01/06 0954) Resp:  [17-24] 20 (01/06 0954) BP: (129-196)/(67-103) 136/78 (01/06 0954) SpO2:  [93 %-99 %] 96 % (01/06 0954) Weight:  [412 lb (186.9 kg)-414 lb (187.8 kg)] 412 lb (186.9 kg) (01/05 2328) Physical Exam: General: obese female, NAD with non-toxic appearance HEENT: normocephalic, atraumatic, moist mucous membranes Neck: supple, non-tender without lymphadenopathy Cardiovascular: regular rate and rhythm without murmurs, rubs, or gallops Lungs: diffuse wheeze throughout without rhonchi or crackles with minimal increased work of breathing on 2 L [place] Abdomen: soft, non-tender, non-distended, normoactive bowel sounds Skin: warm, dry, no rashes or lesions, cap refill < 2 seconds Extremities: warm and well perfused, normal tone, no edema  Laboratory: Recent Labs  Lab 03/14/17 1352 03/15/17 0314  WBC 8.0 10.9*  HGB 10.8* 10.8*  HCT 35.5* 35.5*  PLT 503* 542*   Recent Labs  Lab 03/14/17 1352 03/15/17 0314  NA 134* 133*  K 4.6 4.7  CL  101 104  CO2 24 19*  BUN 14 17  CREATININE 1.48* 1.46*  CALCIUM 9.1 8.7*  PROT 8.4*  --   BILITOT 0.3  --   ALKPHOS 67  --   ALT 19  --   AST 27  --   GLUCOSE 358* 478*   Beta hCG: Negative Lactic acid: 3.07>2.48>3.60 Influenza PCR: Negative Blood culture x2 (1/5): No growth to date, day 1 BNP: 38.9 Troponin: 0.09>0.03>0.05>0.03 D-dimer: Negative UA: Negative RVP PCR: Negative TSH: 2.016 (WNL) ABG: PH 7.  335, PCO2 36.9, PO2 of 73.1, bicarb 19.1, O2 92.8% HIV: Pending UDS: Pending EtOH: Negative Acetaminophen: Negative  Imaging/Diagnostic Tests: DG CHEST 2 VIEW (03/15/17):  Posterior lower lobe opacity on the lateral view is suggestive of airspace disease/pneumonia.  DG CHEST 2 VIEW (03/14/17):  No active cardiopulmonary disease.    Yettem Bing, DO 03/15/2017, 11:55 AM PGY-2, [place] Intern pager: [phone], text pages welcome

## 2017-03-16 ENCOUNTER — Inpatient Hospital Stay (HOSPITAL_COMMUNITY): Payer: BC Managed Care – PPO

## 2017-03-16 DIAGNOSIS — R0600 Dyspnea, unspecified: Secondary | ICD-10-CM

## 2017-03-16 LAB — ECHOCARDIOGRAM COMPLETE
Ao-asc: 34 cm
CHL CUP MV DEC (S): 134
E decel time: 134 msec
FS: 20 % — AB (ref 28–44)
HEIGHTINCHES: 71 in
IVS/LV PW RATIO, ED: 0.92
LA ID, A-P, ES: 51 mm
LA diam index: 1.61 cm/m2
LA vol A4C: 61.9 ml
LA vol index: 21.8 mL/m2
LA vol: 69.2 mL
LEFT ATRIUM END SYS DIAM: 51 mm
LV PW d: 13 mm — AB (ref 0.6–1.1)
LVOT area: 4.15 cm2
LVOT diameter: 23 mm
MV pk E vel: 0.8 m/s
RV TAPSE: 20.1 mm
WEIGHTICAEL: 6585.6 [oz_av]

## 2017-03-16 LAB — CBC WITH DIFFERENTIAL/PLATELET
Basophils Absolute: 0 10*3/uL (ref 0.0–0.1)
Basophils Relative: 0 %
Eosinophils Absolute: 0.1 10*3/uL (ref 0.0–0.7)
Eosinophils Relative: 0 %
HEMATOCRIT: 32.4 % — AB (ref 36.0–46.0)
Hemoglobin: 9.6 g/dL — ABNORMAL LOW (ref 12.0–15.0)
LYMPHS PCT: 23 %
Lymphs Abs: 2.7 10*3/uL (ref 0.7–4.0)
MCH: 25 pg — ABNORMAL LOW (ref 26.0–34.0)
MCHC: 29.6 g/dL — ABNORMAL LOW (ref 30.0–36.0)
MCV: 84.4 fL (ref 78.0–100.0)
Monocytes Absolute: 1 10*3/uL (ref 0.1–1.0)
Monocytes Relative: 8 %
NEUTROS ABS: 8.1 10*3/uL — AB (ref 1.7–7.7)
Neutrophils Relative %: 69 %
PLATELETS: 477 10*3/uL — AB (ref 150–400)
RBC: 3.84 MIL/uL — AB (ref 3.87–5.11)
RDW: 15.3 % (ref 11.5–15.5)
WBC: 11.9 10*3/uL — AB (ref 4.0–10.5)

## 2017-03-16 LAB — GLUCOSE, CAPILLARY
GLUCOSE-CAPILLARY: 412 mg/dL — AB (ref 65–99)
Glucose-Capillary: 253 mg/dL — ABNORMAL HIGH (ref 65–99)
Glucose-Capillary: 293 mg/dL — ABNORMAL HIGH (ref 65–99)
Glucose-Capillary: 335 mg/dL — ABNORMAL HIGH (ref 65–99)

## 2017-03-16 LAB — LACTIC ACID, PLASMA: Lactic Acid, Venous: 1.7 mmol/L (ref 0.5–1.9)

## 2017-03-16 LAB — COMPREHENSIVE METABOLIC PANEL
ALBUMIN: 3 g/dL — AB (ref 3.5–5.0)
ALT: 18 U/L (ref 14–54)
AST: 22 U/L (ref 15–41)
Alkaline Phosphatase: 52 U/L (ref 38–126)
Anion gap: 8 (ref 5–15)
BILIRUBIN TOTAL: 0.7 mg/dL (ref 0.3–1.2)
BUN: 20 mg/dL (ref 6–20)
CHLORIDE: 104 mmol/L (ref 101–111)
CO2: 26 mmol/L (ref 22–32)
CREATININE: 1.23 mg/dL — AB (ref 0.44–1.00)
Calcium: 8.7 mg/dL — ABNORMAL LOW (ref 8.9–10.3)
GFR calc Af Amer: 60 mL/min — ABNORMAL LOW (ref >60)
GFR, EST NON AFRICAN AMERICAN: 51 mL/min — AB (ref >60)
GLUCOSE: 288 mg/dL — AB (ref 65–99)
Potassium: 4.6 mmol/L (ref 3.5–5.1)
Sodium: 138 mmol/L (ref 135–145)
Total Protein: 7.2 g/dL (ref 6.5–8.1)

## 2017-03-16 MED ORDER — ENOXAPARIN SODIUM 100 MG/ML ~~LOC~~ SOLN
90.0000 mg | SUBCUTANEOUS | Status: DC
  Administered 2017-03-17: 90 mg via SUBCUTANEOUS
  Filled 2017-03-16 (×2): qty 1

## 2017-03-16 MED ORDER — INSULIN ASPART 100 UNIT/ML ~~LOC~~ SOLN
6.0000 [IU] | Freq: Once | SUBCUTANEOUS | Status: AC
  Administered 2017-03-16: 6 [IU] via SUBCUTANEOUS

## 2017-03-16 MED ORDER — INSULIN GLARGINE 100 UNIT/ML ~~LOC~~ SOLN
15.0000 [IU] | Freq: Every day | SUBCUTANEOUS | Status: DC
  Administered 2017-03-16: 15 [IU] via SUBCUTANEOUS
  Filled 2017-03-16 (×2): qty 0.15

## 2017-03-16 MED ORDER — PERFLUTREN LIPID MICROSPHERE
1.0000 mL | INTRAVENOUS | Status: AC | PRN
  Administered 2017-03-16: 3 mL via INTRAVENOUS
  Filled 2017-03-16: qty 10

## 2017-03-16 MED ORDER — LEVALBUTEROL HCL 0.63 MG/3ML IN NEBU: 0.6300 mg | INHALATION_SOLUTION | RESPIRATORY_TRACT | Status: DC | PRN

## 2017-03-16 MED ORDER — BUDESONIDE 0.25 MG/2ML IN SUSP
0.2500 mg | Freq: Two times a day (BID) | RESPIRATORY_TRACT | Status: DC
  Administered 2017-03-16 – 2017-03-18 (×5): 0.25 mg via RESPIRATORY_TRACT
  Filled 2017-03-16 (×7): qty 2

## 2017-03-16 NOTE — Progress Notes (Signed)
  Echocardiogram 2D Echocardiogram has been performed.  [name] [name] 03/16/2017, 11:27 AM

## 2017-03-16 NOTE — Progress Notes (Signed)
Family Medicine Teaching Service Daily Progress Note Intern Pager: [phone]  Patient name: [name] Medical record number: [id number] Date of birth: [birth year] Age: 47 y.o. Gender: female  Primary Care Provider: Verner Mould, MD Consultants: None Code Status: Full  Pt Overview and Major Events to Date:  01/05: Admit for hypoxia due to COPD exacerbation 01/06: CXR concerning for PNA  Assessment and Plan: [name] is a 47 y.o. female presenting with asthma exacerbation . PMH is significant for MDD, Bipolar 1 Disorder, HTN, Asthma, CKD3, T2DM, Hypothyriodism.  Acute hypoxic respiratory distress with lactic acidosis  Asthma, COPD exacerbation  SOB: Acute.  Stable but persistent.  Currently on room air with 95% O2 sat.  Continues to have diffuse wheezing, cannot speak in full sentences.  CXR c/w posterior lower lobe opacity suggestive of pneumonia.  Continues to have lactic acidosis despite fluid resuscitation.  Troponins mildly elevated, now plateaued.  EKG sinus tach without ST changes or T wave abnormalities, right axis deviation.  Suspect source of asthma exacerbation is due to recent viral URI due to history of congestion, although bacterial pneumonia is also possible due to her lactic acidosis.  WBC 10.9 -> 11.9 on 1/7.  Lactic acid wnl at 1.7 on 1/7 - Telemetry and continuous pulse ox - Pending echocardiogram - DuoNeb every 4 hours, Xopenex nebulizer every 4 hours as needed, Singulair daily Flonase daily - Prednisone 60 mg daily, day 2 of 5 - Azithromycin, day 2 of 5 - ceftriaxone, day 2 of 5 - starting Pulmicort, switch to MDI at discharge - Supplemental oxygen, keep sats greater than 92%, incentive spirometry - Strict I&O's, daily weights - satting 100% on 4 L O2  Bipolar type I disorder  Anxiety  MDD: Chronic.  Stable.  Home medications include: BuSpar, clonidine, Vraylar, Prozac, Zyprexa, Ativan, Remeron.  Denies SI/HI.  Anxiety likely a component to  patient's shortness of breath in addition to her lung disease. - Continuing home BuSpar 10 mg 3 times daily, Vraylar 3 mg daily, clonidine 0.1 mg daily, Prozac 80 mg daily, Ativan 1 mg 3 times daily as needed for anxiety, Remeron 15 mg daily  - discontinue Zyprexa due to long QT  Non-insulin-dependent type 2 diabetes mellitus: Chronic.  Last A1c uncontrolled, 10.4 last month.  On Januvia and Amaryl at home.   - Holding home Januvia 100 mg daily and Amaryl 8 mg daily - Resistant sliding scale insulin - add Lantus 15 mg daily due to CBGs in high 300s and on prednisone  Hypertension: Chronic.  Remains somewhat hypertensive at 143/78. - Continue home Norvasc 10 mg daily and clonidine 0.1 mg daily  CKD stage III: Chronic.  Creatinine remains at baseline, 1.23 on 1/7. - Daily BMET - Avoid nephrotoxic agents  Hypothyroidism: Chronic.  TSH WNL on admission. - Continue home Synthroid 300 mcg daily  FEN/GI: Carb modified diet Prophylaxis: Lovenox SQ  Disposition: Pending improvement of new O2 requirement due to COPD exacerbation and PNA.  Subjective:  Patient states she is very short of breath and cannot eat or speak well without taking breaks to breathe.  Worried about her high sugars and reassured that these are expected with steroids and that we are starting her on a long-acting insulin.  She later told the nurse that she felt like her throat was closing up.  Objective: Temp:  [98.1 F (36.7 C)-98.6 F (37 C)] 98.6 F (37 C) (01/07 0447) Pulse Rate:  [97-118] 97 (01/07 0447) Resp:  [18-21] 21 (01/07  0447) BP: (135-160)/(78-93) 143/78 (01/07 0447) SpO2:  [95 %-98 %] 95 % (01/07 0447) Weight:  [411 lb 9.6 oz (186.7 kg)] 411 lb 9.6 oz (186.7 kg) (01/06 2053) Physical Exam: General: obese female, NAD with non-toxic appearance HEENT: normocephalic, atraumatic, moist mucous membranes Neck: supple, non-tender without lymphadenopathy Cardiovascular: regular rate and rhythm without murmurs,  rubs, or gallops Lungs: diffuse wheeze throughout without rhonchi or crackles with minimal increased work of breathing on room air Abdomen: soft, non-tender, non-distended, normoactive bowel sounds Skin: warm, dry, no rashes or lesions, cap refill < 2 seconds Extremities: warm and well perfused, normal tone, no edema  Laboratory: Recent Labs  Lab 03/14/17 1352 03/15/17 0314  WBC 8.0 10.9*  HGB 10.8* 10.8*  HCT 35.5* 35.5*  PLT 503* 542*   Recent Labs  Lab 03/14/17 1352 03/15/17 0314  NA 134* 133*  K 4.6 4.7  CL 101 104  CO2 24 19*  BUN 14 17  CREATININE 1.48* 1.46*  CALCIUM 9.1 8.7*  PROT 8.4*  --   BILITOT 0.3  --   ALKPHOS 67  --   ALT 19  --   AST 27  --   GLUCOSE 358* 478*   Beta hCG: Negative Lactic acid: 3.07>2.48>3.60>1.7 Influenza PCR: Negative Blood culture x2 (1/5): No growth to date, day 1 BNP: 38.9 Troponin: 0.09>0.03>0.05>0.03 D-dimer: Negative UA: Negative RVP PCR: Negative TSH: 2.016 (WNL) ABG: PH 7.  335, PCO2 36.9, PO2 of 73.1, bicarb 19.1, O2 92.8% HIV: Pending UDS: Pending EtOH: Negative Acetaminophen: Negative  Imaging/Diagnostic Tests: DG CHEST 2 VIEW (03/15/17):  Posterior lower lobe opacity on the lateral view is suggestive of airspace disease/pneumonia.  DG CHEST 2 VIEW (03/14/17):  No active cardiopulmonary disease.    Kathrene Alu, MD 03/16/2017, 7:04 AM PGY-2, [place] Intern pager: [phone], text pages welcome

## 2017-03-16 NOTE — Progress Notes (Signed)
CBG=412,MD on call notified. Order received to give 6 units novolog instead of 5 units. [place] Asberry, Wonda Cheng, Therapist, sports

## 2017-03-16 NOTE — Progress Notes (Signed)
Inpatient Diabetes Program Recommendations  AACE/ADA: New Consensus Statement on Inpatient Glycemic Control (2015)  Target Ranges:  Prepandial:   less than 140 mg/dL      Peak postprandial:   less than 180 mg/dL (1-2 hours)      Critically ill patients:  140 - 180 mg/dL   Lab Results  Component Value Date   GLUCAP 253 (H) 03/16/2017   HGBA1C 10.4 03/06/2017   Review of Glycemic Control  Diabetes history: DM 2 Outpatient Diabetes medications: Januvia 100 mg Daily, Amaryl 8 mg Daily Current orders for Inpatient glycemic control: Lantus 15 units Daily, Novolog Resistant 0-20 units tid + Novolog HS scale 0-5 units  Inpatient Diabetes Program Recommendations:    A1c 10.4% at the level where patient will benefit from insulin at time of d/c. Lantus 15 units started this am.   If glucose trends are still elevated, due to patient's weight would consider Lantus 28 units (0.15 units/kg). Due to patient being on steroids consider Novolog 5 units tid meal coverage in addition to correction scale.  Thanks,  Tama Headings RN, MSN, [facility] Inpatient Diabetes Coordinator Team Pager [phone] (8a-5p)

## 2017-03-17 LAB — GLUCOSE, CAPILLARY
GLUCOSE-CAPILLARY: 282 mg/dL — AB (ref 65–99)
Glucose-Capillary: 293 mg/dL — ABNORMAL HIGH (ref 65–99)
Glucose-Capillary: 360 mg/dL — ABNORMAL HIGH (ref 65–99)
Glucose-Capillary: 349 mg/dL — ABNORMAL HIGH (ref 65–99)

## 2017-03-17 LAB — CBC
HEMATOCRIT: 31.4 % — AB (ref 36.0–46.0)
Hemoglobin: 9.4 g/dL — ABNORMAL LOW (ref 12.0–15.0)
MCH: 25.4 pg — AB (ref 26.0–34.0)
MCHC: 29.9 g/dL — ABNORMAL LOW (ref 30.0–36.0)
MCV: 84.9 fL (ref 78.0–100.0)
Platelets: 470 10*3/uL — ABNORMAL HIGH (ref 150–400)
RBC: 3.7 MIL/uL — ABNORMAL LOW (ref 3.87–5.11)
RDW: 15.7 % — AB (ref 11.5–15.5)
WBC: 13.2 10*3/uL — AB (ref 4.0–10.5)

## 2017-03-17 LAB — RAPID URINE DRUG SCREEN, HOSP PERFORMED
Amphetamines: NOT DETECTED
BARBITURATES: NOT DETECTED
Benzodiazepines: POSITIVE — AB
Cocaine: NOT DETECTED
Opiates: NOT DETECTED
TETRAHYDROCANNABINOL: NOT DETECTED

## 2017-03-17 LAB — BASIC METABOLIC PANEL
Anion gap: 10 (ref 5–15)
BUN: 21 mg/dL — AB (ref 6–20)
CHLORIDE: 102 mmol/L (ref 101–111)
CO2: 24 mmol/L (ref 22–32)
Calcium: 8.8 mg/dL — ABNORMAL LOW (ref 8.9–10.3)
Creatinine, Ser: 1.3 mg/dL — ABNORMAL HIGH (ref 0.44–1.00)
GFR calc Af Amer: 56 mL/min — ABNORMAL LOW (ref >60)
GFR calc non Af Amer: 48 mL/min — ABNORMAL LOW (ref >60)
GLUCOSE: 352 mg/dL — AB (ref 65–99)
POTASSIUM: 4.6 mmol/L (ref 3.5–5.1)
Sodium: 136 mmol/L (ref 135–145)

## 2017-03-17 MED ORDER — INSULIN GLARGINE 100 UNIT/ML ~~LOC~~ SOLN
20.0000 [IU] | Freq: Every day | SUBCUTANEOUS | Status: DC
  Filled 2017-03-17: qty 0.2

## 2017-03-17 MED ORDER — INSULIN GLARGINE 100 UNIT/ML ~~LOC~~ SOLN
25.0000 [IU] | Freq: Every day | SUBCUTANEOUS | Status: DC
  Administered 2017-03-17 – 2017-03-18 (×2): 25 [IU] via SUBCUTANEOUS
  Filled 2017-03-17 (×2): qty 0.25

## 2017-03-17 MED ORDER — LIVING WELL WITH DIABETES BOOK
Freq: Once | Status: AC
  Administered 2017-03-17: 23:00:00
  Filled 2017-03-17: qty 1

## 2017-03-17 MED ORDER — INSULIN STARTER KIT- PEN NEEDLES (ENGLISH)
1.0000 | Freq: Once | Status: AC
  Administered 2017-03-17: 1
  Filled 2017-03-17: qty 1

## 2017-03-17 MED ORDER — LISINOPRIL 5 MG PO TABS
5.0000 mg | ORAL_TABLET | Freq: Every day | ORAL | Status: DC
  Administered 2017-03-17 – 2017-03-18 (×2): 5 mg via ORAL
  Filled 2017-03-17 (×2): qty 1

## 2017-03-17 NOTE — Progress Notes (Signed)
Inpatient Diabetes Program Recommendations  AACE/ADA: New Consensus Statement on Inpatient Glycemic Control (2015)  Target Ranges:  Prepandial:   less than 140 mg/dL      Peak postprandial:   less than 180 mg/dL (1-2 hours)      Critically ill patients:  140 - 180 mg/dL   Results for [name], [name] (MRN [id number]) as of 03/17/2017 09:54  Ref. Range 03/16/2017 07:45 03/16/2017 11:59 03/16/2017 17:10 03/16/2017 21:09 03/17/2017 07:40  Glucose-Capillary Latest Ref Range: 65 - 99 mg/dL 253 (H) 293 (H) 335 (H) 412 (H) 293 (H)    Review of Glycemic Control  Diabetes history: DM 2 Outpatient Diabetes medications: Januvia 100 mg Daily, Amaryl 8 mg Daily Current orders for Inpatient glycemic control: Lantus 15 units Daily, Novolog Resistant 0-20 units tid + Novolog HS scale 0-5 units  Inpatient Diabetes Program Recommendations:    A1c 10.4% at the level where patient will benefit from insulin at time of d/c. Lantus 15 units started this am.   If glucose trends are still elevated, due to patient's weight would consider Lantus 28 units (0.15 units/kg).   Due to patient being on steroids patient has very high postprandial glucose elevations in the 400's. Consider Novolog 6 units tid meal coverage in addition to correction scale.  Thanks,  Tama Headings RN, MSN, [facility] Inpatient Diabetes Coordinator Team Pager [phone] (8a-5p)

## 2017-03-17 NOTE — Progress Notes (Signed)
Family Medicine Teaching Service Daily Progress Note Intern Pager: [phone]  Patient name: [name] Medical record number: [id number] Date of birth: 1970-06-04 Age: 47 y.o. Gender: female  Primary Care Provider: Verner Mould, MD Consultants: None Code Status: Full  Pt Overview and Major Events to Date:  01/05: Admit for hypoxia due to COPD exacerbation 01/06: CXR concerning for PNA  Assessment and Plan: [name] is a 47 y.o. female presenting with asthma exacerbation . PMH is significant for MDD, Bipolar 1 Disorder, HTN, Asthma, CKD3, T2DM, Hypothyriodism.  Acute hypoxic respiratory distress with lactic acidosis  Asthma, COPD exacerbation  SOB: Acute.  Stable but persistent.  Currently on room air with 95% O2 sat.  Continues to have diffuse wheezing, breathing more comfortably today on 2.5 L O2.  CXR c/w posterior lower lobe opacity suggestive of pneumonia.  Continues to have lactic acidosis despite fluid resuscitation.  Troponins mildly elevated, now plateaued.  EKG sinus tach without ST changes or T wave abnormalities, right axis deviation.  Suspect source of asthma exacerbation is due to recent viral URI due to history of congestion, although bacterial pneumonia is also possible due to her lactic acidosis.   - Telemetry and continuous pulse ox - Echocardiogram with EF 45-50% with diffuse hypokinesis - DuoNeb every 4 hours, Xopenex nebulizer every 4 hours as needed, Singulair daily Flonase daily - Prednisone 60 mg daily, day 4 of 5 - Azithromycin, day 3 of 5 - ceftriaxone, day 3 of 5 - starting Pulmicort nebulizer, switch to MDI at discharge - Supplemental oxygen, keep sats greater than 92%, incentive spirometry - Strict I&O's, daily weights - satting well on 2.5 L O2; wean as tolerated since not on any home O2  Bipolar type I disorder  Anxiety  MDD: Chronic.  Stable.  Home medications include: BuSpar, clonidine, Vraylar, Prozac, Zyprexa, Ativan, Remeron.   Denies SI/HI.  Anxiety likely a component to patient's shortness of breath in addition to her lung disease. - Continuing home BuSpar 10 mg 3 times daily, Vraylar 3 mg daily, clonidine 0.1 mg daily, Prozac 80 mg daily, Ativan 1 mg 3 times daily as needed for anxiety, Remeron 15 mg daily  - hold Zyprexa due to long QT - this medication may have contributed to patient's worsening A1c  Non-insulin-dependent type 2 diabetes mellitus: Chronic.  Last A1c uncontrolled, 10.4 last month.  On Januvia and Amaryl at home.   - Holding home Januvia 100 mg daily and Amaryl 8 mg daily - Resistant sliding scale insulin - increase Lantus to 25 mg daily due to CBGs in high 300s, low 400s and on prednisone - will need to send her home on insulin; will do diabetes teaching tomorrow - will add statin, ASA, beta blocker, ACEI, metformin prior to discharge (patient has contraindications to ACEI and metformin on chart d/t prior suicide attempt, but we need to give her these medications for her overall health)  Hypertension: Chronic.  Somewhat hypertensive yesterday but last BP normotensive at 134/70. - Continue home Norvasc 10 mg daily and clonidine 0.1 mg daily  CKD stage III: Chronic.  Creatinine remains at baseline, 1.30 on 1/8. - Daily BMET - Avoid nephrotoxic agents  Hypothyroidism: Chronic.  TSH WNL on admission. - Continue home Synthroid 300 mcg daily  FEN/GI: Carb modified diet Prophylaxis: Lovenox SQ  Disposition: Pending improvement of new O2 requirement due to COPD exacerbation and PNA.  Subjective:  Patient states she feels much better this morning but still has wheezing and cough.  Her shortness of breath has improved.  Objective: Temp:  [98.1 F (36.7 C)-98.7 F (37.1 C)] 98.2 F (36.8 C) (01/08 0418) Pulse Rate:  [95-115] 96 (01/08 0418) Resp:  [18-20] 18 (01/08 0418) BP: (134-160)/(70-106) 134/70 (01/08 0418) SpO2:  [92 %-100 %] 99 % (01/08 0438) Weight:  [410 lb 8 oz (186.2 kg)] 410 lb 8  oz (186.2 kg) (01/07 2110) Physical Exam: General: obese female, NAD with non-toxic appearance HEENT: normocephalic, atraumatic, moist mucous membranes Neck: supple, non-tender without lymphadenopathy Cardiovascular: regular rate and rhythm without murmurs, rubs, or gallops Lungs: diffuse wheeze (although better than yesterday) throughout without rhonchi or crackles with minimal increased work of breathing on 2.5 L O2 Abdomen: soft, non-tender, non-distended, normoactive bowel sounds Skin: warm, dry, no rashes or lesions, cap refill < 2 seconds Extremities: warm and well perfused, normal tone, no edema  Laboratory: Recent Labs  Lab 03/15/17 0314 03/16/17 0710 03/17/17 0513  WBC 10.9* 11.9* 13.2*  HGB 10.8* 9.6* 9.4*  HCT 35.5* 32.4* 31.4*  PLT 542* 477* 470*   Recent Labs  Lab 03/14/17 1352 03/15/17 0314 03/16/17 0710 03/17/17 0513  NA 134* 133* 138 136  K 4.6 4.7 4.6 4.6  CL 101 104 104 102  CO2 24 19* 26 24  BUN 14 17 20  21*  CREATININE 1.48* 1.46* 1.23* 1.30*  CALCIUM 9.1 8.7* 8.7* 8.8*  PROT 8.4*  --  7.2  --   BILITOT 0.3  --  0.7  --   ALKPHOS 67  --  52  --   ALT 19  --  18  --   AST 27  --  22  --   GLUCOSE 358* 478* 288* 352*   Beta hCG: Negative Lactic acid: 3.07>2.48>3.60>1.7 Influenza PCR: Negative Blood culture x2 (1/5): No growth to date, day 1 BNP: 38.9 Troponin: 0.09>0.03>0.05>0.03 D-dimer: Negative UA: Negative RVP PCR: Negative TSH: 2.016 (WNL) ABG: PH 7.  335, PCO2 36.9, PO2 of 73.1, bicarb 19.1, O2 92.8% HIV: Pending UDS: Pending EtOH: Negative Acetaminophen: Negative  Imaging/Diagnostic Tests: DG CHEST 2 VIEW (03/15/17):  Posterior lower lobe opacity on the lateral view is suggestive of airspace disease/pneumonia.  DG CHEST 2 VIEW (03/14/17):  No active cardiopulmonary disease.    Kathrene Alu, MD 03/17/2017, 7:16 AM PGY-1, [place] Intern pager: [phone], text pages welcome

## 2017-03-18 LAB — CBC
HEMATOCRIT: 31.3 % — AB (ref 36.0–46.0)
HEMOGLOBIN: 9.3 g/dL — AB (ref 12.0–15.0)
MCH: 24.9 pg — AB (ref 26.0–34.0)
MCHC: 29.7 g/dL — AB (ref 30.0–36.0)
MCV: 83.9 fL (ref 78.0–100.0)
Platelets: 468 10*3/uL — ABNORMAL HIGH (ref 150–400)
RBC: 3.73 MIL/uL — ABNORMAL LOW (ref 3.87–5.11)
RDW: 15.1 % (ref 11.5–15.5)
WBC: 15.1 10*3/uL — ABNORMAL HIGH (ref 4.0–10.5)

## 2017-03-18 LAB — BASIC METABOLIC PANEL
ANION GAP: 11 (ref 5–15)
BUN: 22 mg/dL — ABNORMAL HIGH (ref 6–20)
CO2: 22 mmol/L (ref 22–32)
Calcium: 8.9 mg/dL (ref 8.9–10.3)
Chloride: 101 mmol/L (ref 101–111)
Creatinine, Ser: 1.31 mg/dL — ABNORMAL HIGH (ref 0.44–1.00)
GFR calc Af Amer: 55 mL/min — ABNORMAL LOW (ref >60)
GFR, EST NON AFRICAN AMERICAN: 48 mL/min — AB (ref >60)
GLUCOSE: 332 mg/dL — AB (ref 65–99)
POTASSIUM: 4.4 mmol/L (ref 3.5–5.1)
Sodium: 134 mmol/L — ABNORMAL LOW (ref 135–145)

## 2017-03-18 LAB — GLUCOSE, CAPILLARY
Glucose-Capillary: 264 mg/dL — ABNORMAL HIGH (ref 65–99)
Glucose-Capillary: 257 mg/dL — ABNORMAL HIGH (ref 65–99)

## 2017-03-18 MED ORDER — FLUTICASONE-SALMETEROL 115-21 MCG/ACT IN AERO: 2.0000 | INHALATION_SPRAY | Freq: Two times a day (BID) | RESPIRATORY_TRACT | 0 refills | Status: DC

## 2017-03-18 MED ORDER — ROSUVASTATIN CALCIUM 10 MG PO TABS: 10.0000 mg | ORAL_TABLET | Freq: Every day | ORAL | 0 refills | Status: DC

## 2017-03-18 MED ORDER — METOPROLOL SUCCINATE ER 25 MG PO TB24: 25.0000 mg | ORAL_TABLET | Freq: Every day | ORAL | 0 refills | Status: DC

## 2017-03-18 MED ORDER — INSULIN DETEMIR 100 UNIT/ML FLEXPEN: 20.0000 [IU] | PEN_INJECTOR | Freq: Every day | SUBCUTANEOUS | 0 refills | Status: DC

## 2017-03-18 MED ORDER — IPRATROPIUM-ALBUTEROL 0.5-2.5 (3) MG/3ML IN SOLN
3.0000 mL | Freq: Four times a day (QID) | RESPIRATORY_TRACT | Status: DC
  Administered 2017-03-18 (×2): 3 mL via RESPIRATORY_TRACT
  Filled 2017-03-18 (×3): qty 3

## 2017-03-18 MED ORDER — METFORMIN HCL 500 MG PO TABS: 500.0000 mg | ORAL_TABLET | Freq: Every day | ORAL | 0 refills | Status: DC

## 2017-03-18 MED ORDER — INSULIN GLARGINE 100 UNIT/ML ~~LOC~~ SOLN: 20.0000 [IU] | Freq: Every day | SUBCUTANEOUS | 0 refills | Status: DC

## 2017-03-18 MED ORDER — LISINOPRIL 5 MG PO TABS: 5.0000 mg | ORAL_TABLET | Freq: Every day | ORAL | 0 refills | Status: DC

## 2017-03-18 MED ORDER — INSULIN GLARGINE 100 UNITS/ML SOLOSTAR PEN: 20.0000 [IU] | PEN_INJECTOR | Freq: Every day | SUBCUTANEOUS | 0 refills | Status: DC

## 2017-03-18 MED ORDER — AZITHROMYCIN 250 MG PO TABS: 250.0000 mg | ORAL_TABLET | Freq: Every day | ORAL | 0 refills | Status: DC

## 2017-03-18 NOTE — Care Management Note (Signed)
Case Management Note  Patient Details  Name: [name] MRN: [id number] Date of Birth: 02-03-1971  Subjective/Objective:    Acute resp distress, COPD, asthma, newly diagnosed DM                Action/Plan: Discharge Planning: Spoke to pt and provided pt with Lantus $0 copay card. States she works full-time. Will need a note for work. Notified attending.   PCP Verner Mould   Expected Discharge Date:  03/18/17               Expected Discharge Plan:  Home/Self Care  In-House Referral:  NA  Discharge planning Services  CM Consult  Post Acute Care Choice:  NA Choice offered to:  NA  DME Arranged:  N/A DME Agency:  NA  HH Arranged:  NA HH Agency:  NA  Status of Service:  Completed, signed off  If discussed at [place] of Stay Meetings, dates discussed:    Additional Comments:  Erenest Rasher, RN 03/18/2017, 2:02 PM

## 2017-03-18 NOTE — Progress Notes (Signed)
Patient discharged to home with husband. IV removed. Telemetry removed. All discharge instructions reviewed. All medications reviewed. Patient left unit in stable condition with all belongings in tow.  Sheliah Plane RN

## 2017-03-18 NOTE — Progress Notes (Signed)
Inpatient Diabetes Program Recommendations  AACE/ADA: New Consensus Statement on Inpatient Glycemic Control (2015)  Target Ranges:  Prepandial:   less than 140 mg/dL      Peak postprandial:   less than 180 mg/dL (1-2 hours)      Critically ill patients:  140 - 180 mg/dL   Lab Results  Component Value Date   GLUCAP 264 (H) 03/18/2017   HGBA1C 10.4 03/06/2017    Review of Glycemic Control Results for Samantha, [name] (MRN [id number]) as of 03/18/2017 11:52  Ref. Range 03/17/2017 07:40 03/17/2017 11:39 03/17/2017 16:41 03/17/2017 21:36 03/18/2017 07:30  Glucose-Capillary Latest Ref Range: 65 - 99 mg/dL 293 (H) 360 (H) 282 (H) 349 (H) 264 (H)   Diabetes history:DM 2 Outpatient Diabetes medications:Januvia 100 mg Daily, Amaryl 8 mg Daily Current orders for Inpatient glycemic control:Lantus 25 units Daily, Novolog Resistant 0-20 units tid + Novolog HS scale 0-5 units  Inpatient Diabetes Program Recommendations:   Noted continued hyperglycemia -Increase Lantus to 28 units qd -Novolog 6 units tid meal coverage  Thank you, Bethena Roys E. Izaias Krupka, RN, MSN, CDE  Diabetes Coordinator Inpatient Glycemic Control Team Team Pager [phone] (8am-5pm) 03/18/2017 12:00 PM

## 2017-03-18 NOTE — Discharge Instructions (Signed)
Thank you for allowing [name] to participate in your care, Ms. Fayson!  We have added some medications to what you are currently taking.  Rosuvastatin (Crestor) - cholesterol medication Metoprolol - heart medication Lisinopril - blood pressure medication, also good for kidneys and heart Advair inhaler - will control your asthma Metformin - diabetes medication Lantus Insulin - we only gave you long-acting insulin, so you will take this once a day.  You will not take mealtime insulin.   Please stop taking Amaryl, one of your old diabetes medications.  Please bring all of your medications with you to see Dr. Avon Gully tomorrow morning.  She will answer any questions you have about them.  Good luck and be well! Dr. Shan Levans

## 2017-03-18 NOTE — Progress Notes (Signed)
Family Medicine Teaching Service Daily Progress Note Intern Pager: [phone]  Patient name: [name] Medical record number: [id number] Date of birth: Aug 03, 1970 Age: 47 y.o. Gender: female  Primary Care Provider: Verner Mould, MD Consultants: None Code Status: Full  Pt Overview and Major Events to Date:  01/05: Admit for hypoxia due to COPD exacerbation 01/06: CXR concerning for PNA  Assessment and Plan: [name] is a 47 y.o. female presenting with asthma exacerbation . PMH is significant for MDD, Bipolar 1 Disorder, HTN, Asthma, CKD3, T2DM, Hypothyriodism.  Acute hypoxic respiratory distress with lactic acidosis  Asthma, COPD exacerbation  SOB: Acute.  Stable but persistent.  Currently on room air with 95% O2 sat.  Continues to have diffuse wheezing, breathing more comfortably today on 2.5 L O2.  CXR c/w posterior lower lobe opacity suggestive of pneumonia.  Continues to have lactic acidosis despite fluid resuscitation.  Troponins mildly elevated, now plateaued.  EKG sinus tach without ST changes or T wave abnormalities, right axis deviation.  Suspect source of asthma exacerbation is due to recent viral URI due to history of congestion, although bacterial pneumonia is also possible due to her lactic acidosis.   - Telemetry and continuous pulse ox - Echocardiogram with EF 45-50% with diffuse hypokinesis - DuoNeb every 4 hours, Xopenex nebulizer every 4 hours as needed, Singulair daily Flonase daily - Prednisone 60 mg daily, day 5 of 5 - Azithromycin, day 4 of 5 - ceftriaxone, day 4 of 5 (will discontinue at discharge) - starting Pulmicort nebulizer, switch to MDI at discharge - Supplemental oxygen, keep sats greater than 92%, incentive spirometry - Strict I&O's, daily weights - satting well on room air - start metformin (patient willing to do this even with history of using this med for suicide attempt in the past)  Bipolar type I disorder  Anxiety  MDD:  Chronic.  Stable.  Home medications include: BuSpar, clonidine, Vraylar, Prozac, Zyprexa, Ativan, Remeron.  Denies SI/HI.  Anxiety likely a component to patient's shortness of breath in addition to her lung disease. - Continuing home BuSpar 10 mg 3 times daily, Vraylar 3 mg daily, clonidine 0.1 mg daily, Prozac 80 mg daily, Ativan 1 mg 3 times daily as needed for anxiety, Remeron 15 mg daily  - hold Zyprexa due to long QT - this medication may have contributed to patient's worsening A1c - will send message to [facility] about switching from Zyprexa to a different antipsychotic since Zyprexa can worsen metabolic syndrome  Non-insulin-dependent type 2 diabetes mellitus: Chronic.  Last A1c uncontrolled, 10.4 last month.  On Januvia and Amaryl at home.   - Holding home Januvia 100 mg daily and Amaryl 8 mg daily - Resistant sliding scale insulin - increase Lantus to 25 mg daily due to CBGs in high 300s, low 400s and on prednisone - will need to send her home on insulin; did diabetes teaching with pharmacy this morning - will add statin, ASA, beta blocker, ACEI, metformin prior to discharge (patient has contraindications to ACEI and metformin on chart d/t prior suicide attempt, but we need to give her these medications for her overall health)  Borderline HFrEF: Echo during admission with EF 45-50%.  Will start several new medications for heart health especially since patient has uncontrolled G8JE and metabolic syndrome.  Discussed initiating these with patient yesterday, and she is amenable to starting these. - start metoprolol - start statin - start ACEI (patient has had a tubal ligation)  Hypertension: Chronic.  Hypertensive this  morning at 171/101. - Continue home Norvasc 10 mg daily and clonidine 0.1 mg daily - will start lisinopril   CKD stage III: Chronic.  Creatinine remains at baseline, 1.30 on 1/8. - Daily BMET - Avoid nephrotoxic agents - will start lisinopril  Hypothyroidism: Chronic.   TSH WNL on admission. - Continue home Synthroid 300 mcg daily  FEN/GI: Carb modified diet Prophylaxis: Lovenox SQ  Disposition: home  Subjective:  Patient states she continues to feel improved this morning and is hopeful for the future.  She was very appreciate of our care here.  Objective: Temp:  [98 F (36.7 C)-100.5 F (38.1 C)] 100.5 F (38.1 C) (01/09 0935) Pulse Rate:  [100-106] 106 (01/09 0935) Resp:  [18-22] 22 (01/09 0935) BP: (141-171)/(78-101) 171/101 (01/09 0935) SpO2:  [94 %-99 %] 97 % (01/09 0935) Weight:  [410 lb 4.8 oz (186.1 kg)] 410 lb 4.8 oz (186.1 kg) (01/08 2137) Physical Exam: General: obese female, NAD with non-toxic appearance HEENT: normocephalic, atraumatic, moist mucous membranes Neck: supple, non-tender without lymphadenopathy Cardiovascular: regular rate and rhythm without murmurs, rubs, or gallops Lungs: minimal wheeze in left lung base without rhonchi or crackles  Abdomen: soft, non-tender, non-distended, normoactive bowel sounds Skin: warm, dry, no rashes or lesions, cap refill < 2 seconds Extremities: warm and well perfused, normal tone, no edema  Laboratory: Recent Labs  Lab 03/16/17 0710 03/17/17 0513 03/18/17 0539  WBC 11.9* 13.2* 15.1*  HGB 9.6* 9.4* 9.3*  HCT 32.4* 31.4* 31.3*  PLT 477* 470* 468*   Recent Labs  Lab 03/14/17 1352  03/16/17 0710 03/17/17 0513 03/18/17 0539  NA 134*   < > 138 136 134*  K 4.6   < > 4.6 4.6 4.4  CL 101   < > 104 102 101  CO2 24   < > 26 24 22   BUN 14   < > 20 21* 22*  CREATININE 1.48*   < > 1.23* 1.30* 1.31*  CALCIUM 9.1   < > 8.7* 8.8* 8.9  PROT 8.4*  --  7.2  --   --   BILITOT 0.3  --  0.7  --   --   ALKPHOS 67  --  52  --   --   ALT 19  --  18  --   --   AST 27  --  22  --   --   GLUCOSE 358*   < > 288* 352* 332*   < > = values in this interval not displayed.   Beta hCG: Negative Lactic acid: 3.07>2.48>3.60>1.7 Influenza PCR: Negative Blood culture x2 (1/5): No growth to date, day  1 BNP: 38.9 Troponin: 0.09>0.03>0.05>0.03 D-dimer: Negative UA: Negative RVP PCR: Negative TSH: 2.016 (WNL) ABG: PH 7.  335, PCO2 36.9, PO2 of 73.1, bicarb 19.1, O2 92.8% HIV: Pending UDS: Pending EtOH: Negative Acetaminophen: Negative  Imaging/Diagnostic Tests: DG CHEST 2 VIEW (03/15/17):  Posterior lower lobe opacity on the lateral view is suggestive of airspace disease/pneumonia.  DG CHEST 2 VIEW (03/14/17):  No active cardiopulmonary disease.    Kathrene Alu, MD 03/18/2017, 9:42 AM PGY-1, [place] Intern pager: [phone], text pages welcome

## 2017-03-19 ENCOUNTER — Ambulatory Visit (INDEPENDENT_AMBULATORY_CARE_PROVIDER_SITE_OTHER): Payer: BC Managed Care – PPO | Admitting: Internal Medicine

## 2017-03-19 ENCOUNTER — Encounter: Payer: Self-pay | Admitting: Internal Medicine

## 2017-03-19 ENCOUNTER — Telehealth: Payer: Self-pay | Admitting: *Deleted

## 2017-03-19 ENCOUNTER — Other Ambulatory Visit: Payer: Self-pay

## 2017-03-19 DIAGNOSIS — E785 Hyperlipidemia, unspecified: Secondary | ICD-10-CM | POA: Insufficient documentation

## 2017-03-19 DIAGNOSIS — E039 Hypothyroidism, unspecified: Secondary | ICD-10-CM

## 2017-03-19 DIAGNOSIS — E118 Type 2 diabetes mellitus with unspecified complications: Secondary | ICD-10-CM | POA: Diagnosis not present

## 2017-03-19 DIAGNOSIS — E782 Mixed hyperlipidemia: Secondary | ICD-10-CM

## 2017-03-19 DIAGNOSIS — I10 Essential (primary) hypertension: Secondary | ICD-10-CM | POA: Diagnosis not present

## 2017-03-19 DIAGNOSIS — Z794 Long term (current) use of insulin: Secondary | ICD-10-CM

## 2017-03-19 DIAGNOSIS — J189 Pneumonia, unspecified organism: Secondary | ICD-10-CM | POA: Diagnosis not present

## 2017-03-19 LAB — CULTURE, BLOOD (ROUTINE X 2)
CULTURE: NO GROWTH
Culture: NO GROWTH
SPECIAL REQUESTS: ADEQUATE
Special Requests: ADEQUATE

## 2017-03-19 MED ORDER — FLUTICASONE FUROATE-VILANTEROL 100-25 MCG/INH IN AEPB: 1.0000 | INHALATION_SPRAY | Freq: Every day | RESPIRATORY_TRACT | 0 refills | Status: DC

## 2017-03-19 MED ORDER — ATORVASTATIN CALCIUM 40 MG PO TABS: 40.0000 mg | ORAL_TABLET | Freq: Every day | ORAL | 3 refills | Status: DC

## 2017-03-19 MED ORDER — INSULIN GLARGINE 100 UNIT/ML SOLOSTAR PEN: 50.0000 [IU] | PEN_INJECTOR | Freq: Every day | SUBCUTANEOUS | 3 refills | Status: DC

## 2017-03-19 MED ORDER — FLUTICASONE FUROATE-VILANTEROL 100-25 MCG/INH IN AEPB: 1.0000 | INHALATION_SPRAY | Freq: Every day | RESPIRATORY_TRACT | 3 refills | Status: DC

## 2017-03-19 MED ORDER — INSULIN GLARGINE 100 UNIT/ML SOLOSTAR PEN: 50.0000 [IU] | PEN_INJECTOR | Freq: Every day | SUBCUTANEOUS | 99 refills | Status: DC

## 2017-03-19 NOTE — Assessment & Plan Note (Signed)
BP 110/70 today. As patient trying to minimize cost of medications, will discontinue amlodipine today.  - Current regimen: lisinopril 5mg  qd, Toprol XR 25mg  qd, clonidine 0.1mg  qd

## 2017-03-19 NOTE — Assessment & Plan Note (Signed)
Improved. Still with wheezing bilaterally, though good air movement and normal WOB on RA. O2 sat 94% on RA after exertion. Will complete 5d course of azithromycin today.  - F/u in 1 mo. Encouraged to schedule sooner f/u if any issues in the meantime

## 2017-03-19 NOTE — Assessment & Plan Note (Signed)
Started on insulin during recent admission. Will likely need to increase insulin dose, but will start with 20U Lantus qhs. Discussed with Dr. Valentina Lucks today, who advised increasing by 2U if CBG >100. Patient given one free month of Lantus today. Discussed that she will likely have to pay until she has met her deductible, however Lantus is preferred by [facility] over [place], so it is best to continue with Lantus. Also started on metformin and glimepiride discontinued.  - Continue Lantus 20U. Increase by 2U every evening that blood sugar >100 - Continue metformin - F/u with Dr. Valentina Lucks in 2 weeks. Call sooner for appt with Dr. Valentina Lucks or with me if persistently high blood sugars despite insulin over the next week.  - F/u with me in one month

## 2017-03-19 NOTE — Patient Instructions (Addendum)
It was nice seeing you again today [name]!  Please begin taking Lantus (insulin) 20 units at bedtime. You do not have to take it exactly at 10 PM, you can take it before you go to bed, at whatever time that is. Starting tomorrow, if your blood sugar is higher than 100, increase Lantus by 2 units (example: if your blood sugar tomorrow night is 150, increase Lantus to 22 units. If it is 150 the following night, increase two more units to 24 units, etc). Ideally you will measure your blood sugar four times a day, however if you are concerned about using your testing strips, you can measure before breakfast in the morning and before bed at night.   Please take all of the medications on the list provided.   Dr. Valentina Lucks (our pharmacist) will see you back in two weeks, however if you are worried that your blood sugars are too higher, you can schedule an appointment with him or with me sooner.   I will see you back in one month.   If you have any questions or concerns, please feel free to call the clinic.   Be well,  Dr. Avon Gully

## 2017-03-19 NOTE — Telephone Encounter (Signed)
Pharmacy received refill for Lantus solostar but not pen needles. Please send rx for pen needles. Hubbard Hartshorn, RN, BSN

## 2017-03-19 NOTE — Assessment & Plan Note (Signed)
Continue synthroid 300 mcg qd

## 2017-03-19 NOTE — Assessment & Plan Note (Signed)
Prescribed rosuvastatin inpatient, though this is not on $4 list, and finance is an issue. Will instead switch to atorvastatin 40mg  (also high intensity), as this is on $4 list. Last lipid panel in 2017, however patient now on high intensity statin for prevention anyway, so will not recheck today.

## 2017-03-19 NOTE — Progress Notes (Signed)
Subjective:   Patient: [name]       Birthdate: [birth year]       MRN: [id number]      HPI  [name] is a 46 y.o. female presenting for hospital f/u.   Hospital follow-up Patient was recently admitted from 01/05-01/09 for asthma exacerbation and PNA. Was also noted to have echo with 45-50% EF (borderline for HFrEF), as well as CBGs in 400s requiring resistant sliding scale insulin. Patient does have history of diabetes though has not required insulin before, however she was on prednisone while inpatient, which likely contributed to elevated CBGs at least in part. She was started on a variety of new medications, including Advair, metformin, 20U levemir, metoprolol, rosuvastatin and lisinopril. She was also discharged with one additional dose of azithromycin to complete 5d treatment course for CAP.   Since hospital discharge yesterday, patient reports continuing to feel better. She still endorses wheezing, but says generally her respiratory status continues to improve. She will take her last dose of azithromycin today. She did not pick up her medications yesterday because she wanted to go straight home from the hospital rather than stopping by the pharmacy first. She is very concerned about being able to afford her medications. She was prescribed Lantus, however was told by [facility] that she would have to pay a significant amount of money for this, so was then prescribed Levemir instead in hopes that it would be more affordable. She has not tried to fill this prescription so is unsure of the cost. Did check her CBG last night, which was >500, however she did not take any insulin yesterday.     Smoking status reviewed. Patient is never smoker.   Review of Systems See HPI.     Objective:  Physical Exam  Constitutional: She is oriented to person, place, and time.  Pleasant, obese female in NAD  HENT:  Head: Normocephalic and atraumatic.  Nose: Nose normal.  Mouth/Throat: Oropharynx is  clear and moist. No oropharyngeal exudate.  Eyes: Conjunctivae and EOM are normal. Pupils are equal, round, and reactive to light. Right eye exhibits no discharge. Left eye exhibits no discharge.  Cardiovascular: Regular rhythm and normal heart sounds.  No murmur heard. Slightly tachycardic  Pulmonary/Chest: No respiratory distress.  Diffuse wheezing bilaterally though normal WOB on RA.   Neurological: She is alert and oriented to person, place, and time.  Skin: Skin is warm and dry.  Psychiatric: Affect and judgment normal.    Assessment & Plan:  Essential hypertension, benign BP 110/70 today. As patient trying to minimize cost of medications, will discontinue amlodipine today.  - Current regimen: lisinopril 65m qd, Toprol XR 233mqd, clonidine 0.22m32md  Community acquired pneumonia Improved. Still with wheezing bilaterally, though good air movement and normal WOB on RA. O2 sat 94% on RA after exertion. Will complete 5d course of azithromycin today.  - F/u in 1 mo. Encouraged to schedule sooner f/u if any issues in the meantime  Hypothyroidism Continue synthroid 300 mcg qd  Type II diabetes mellitus with complication ([place] recent [place]. Will likely need to increase insulin dose, but will start with 20U Lantus qhs. Discussed with Dr. KovValentina Lucksday, who advised increasing by 2U if CBG >100. Patient given one free month of Lantus today. Discussed that she will likely have to pay until she has met her deductible, however [facility] it is best to continue with Lantus. Also  started on metformin and glimepiride discontinued.  - Continue Lantus 20U. Increase by 2U every evening that blood sugar >100 - Continue metformin - F/u with Dr. Valentina Lucks in 2 weeks. Call sooner for appt with Dr. Valentina Lucks or with me if persistently high blood sugars despite insulin over the next week.  - F/u with me in one month  HLD (hyperlipidemia) Prescribed  rosuvastatin inpatient, though this is not on $4 list, and finance is an issue. Will instead switch to atorvastatin 85m (also high intensity), as this is on $4 list. Last lipid panel in 2017, however patient now on high intensity statin for prevention anyway, so will not recheck today.    AAdin Hector MD, MPH PGY-3 [facility] Pager [phone]

## 2017-03-23 NOTE — Telephone Encounter (Signed)
Called patient's pharmacy. Prescription for pen needles sent in. Pharmacy ordered most affordable needles covered by patient's insurance, as finance is an issue.   Adin Hector, MD, MPH PGY-3 [place] [facility] Pager [phone]

## 2017-04-15 ENCOUNTER — Telehealth: Payer: Self-pay

## 2017-04-15 NOTE — Telephone Encounter (Signed)
Patient left message on nurse line that she needs a refill on Metformin and also would like to get the less expensive [facility] brand insulin. Danley Danker, RN [facility] RN)

## 2017-04-16 ENCOUNTER — Other Ambulatory Visit: Payer: Self-pay | Admitting: *Deleted

## 2017-04-17 ENCOUNTER — Telehealth: Payer: Self-pay | Admitting: *Deleted

## 2017-04-17 MED ORDER — INSULIN GLARGINE 100 UNIT/ML SOLOSTAR PEN: 50.0000 [IU] | PEN_INJECTOR | Freq: Every day | SUBCUTANEOUS | 0 refills | Status: DC

## 2017-04-17 NOTE — Telephone Encounter (Signed)
Pt called nurse line for the following reasons:   1. To check status of metformin refill (only has enough to last until Monday)  2. Lantus will cost pt $100 dollars.  Can this be changed to something else?   Of note,  Pt will be out of lantus today.  I put 2 samples up front for her to pick up until we can find an alternative.  She also states that the discount card she was given at the hospital was "not any good" according to pharmacy. [place], Salome Spotted, CMA

## 2017-04-20 MED ORDER — METFORMIN HCL 500 MG PO TABS: 500.0000 mg | ORAL_TABLET | Freq: Every day | ORAL | 3 refills | Status: DC

## 2017-04-20 NOTE — Telephone Encounter (Signed)
1 yr prescription for metformin sent in to patient pharmacy. Patient can use sample Lantus for now. Will discuss alternative more affordable option with Dr. Valentina Lucks when I am in clinic tomorrow.   Adin Hector, MD, MPH PGY-3 [place] [facility] Pager [phone]

## 2017-04-22 ENCOUNTER — Telehealth: Payer: Self-pay

## 2017-04-22 MED ORDER — BASAGLAR KWIKPEN 100 UNIT/ML ~~LOC~~ SOPN: 50.0000 [IU] | PEN_INJECTOR | Freq: Every day | SUBCUTANEOUS | 3 refills | Status: DC

## 2017-04-22 NOTE — Addendum Note (Signed)
Addended by: Adin Hector on: 04/22/2017 09:01 AM   Modules accepted: Orders

## 2017-04-22 NOTE — Telephone Encounter (Signed)
Calles patient and left voice message informing her that a cheaper form of insulin had been called into her pharmacy.Ozella Almond, CMA

## 2017-04-22 NOTE — Telephone Encounter (Signed)
Prescription for Basaglar sent to patient's pharmacy in place of Lantus, as basaglar now preferred. Will take 50U basaglar nightly at 10PM (same dosing as Lantus).   Adin Hector, MD, MPH PGY-3 [place] [facility] Pager [phone]

## 2017-04-29 ENCOUNTER — Other Ambulatory Visit: Payer: Self-pay

## 2017-04-29 ENCOUNTER — Ambulatory Visit (INDEPENDENT_AMBULATORY_CARE_PROVIDER_SITE_OTHER): Payer: BC Managed Care – PPO | Admitting: Internal Medicine

## 2017-04-29 ENCOUNTER — Encounter: Payer: Self-pay | Admitting: Internal Medicine

## 2017-04-29 ENCOUNTER — Ambulatory Visit: Payer: Self-pay | Admitting: Internal Medicine

## 2017-04-29 DIAGNOSIS — F411 Generalized anxiety disorder: Secondary | ICD-10-CM

## 2017-04-29 DIAGNOSIS — Z794 Long term (current) use of insulin: Secondary | ICD-10-CM

## 2017-04-29 DIAGNOSIS — E118 Type 2 diabetes mellitus with unspecified complications: Secondary | ICD-10-CM

## 2017-04-29 DIAGNOSIS — F319 Bipolar disorder, unspecified: Secondary | ICD-10-CM | POA: Diagnosis not present

## 2017-04-29 MED ORDER — BASAGLAR KWIKPEN 100 UNIT/ML ~~LOC~~ SOPN: 75.0000 [IU] | PEN_INJECTOR | Freq: Every day | SUBCUTANEOUS | 3 refills | Status: DC

## 2017-04-29 MED ORDER — LORAZEPAM 1 MG PO TABS: 1.0000 mg | ORAL_TABLET | Freq: Three times a day (TID) | ORAL | 0 refills | Status: DC | PRN

## 2017-04-29 NOTE — Assessment & Plan Note (Signed)
Diagnosed by and followed by [facility] for this issue. Currently prescribed clonidine, Prozac, mirtazepine, buspirone, and as of last week Vraylar. Patient has been receiving Ativan from [facility] and requested refill today. Discussed that it is not the best care to have two providers treating the same issue, especially as Beverly Sessions is using medications that are not typically used at [facility] and are beyond our spectrum of care. Patient very concerned about stopping Ativan. Discussed that Ativan in general is not a medication to rely upon, so it would be best care for her to discontinue anyway. Will provide one additional refill so patient has enough until her next [facility] appt in 6 weeks, however informed patient that I will not be providing any additional Ativan refills after that, and she needs to come up with a plan with [facility] for that.

## 2017-04-29 NOTE — Patient Instructions (Addendum)
It was nice seeing you again today [name]!  Please STOP using Lantus and start using basaglar. Increase to 60 units of basaglar at nighttime. For every day that your blood sugar is greater than 150, increase by 2 units (example: take 60 units tonight. If your blood sugar tomorrow night is more than 150, increase to 62 units, etc). If your insurance will not cover basaglar, please let me know.   I will see you back in about two months.   If you have any questions or concerns, please feel free to call the clinic.   Be well,  Dr. Avon Gully

## 2017-04-29 NOTE — Assessment & Plan Note (Signed)
Uncontrolled. Persistently elevated blood sugars in 300s despite 50U Lantus qhs. As such, will increase basal insulin today. As patient's Lantus coupon not applicable for doses >28M per day, will switch to basaglar, which is reportedly her insurance's preferred basal insulin. Will begin at 60U basaglar qhs, and increase by 2U nightly for blood sugar >150. Patient to schedule appt with Dr. Valentina Lucks in about one month to discuss possibly adding additional agents if necessary. Patient to f/u with me in two months to see how she is doing with med regimen. A1C due next month.

## 2017-04-29 NOTE — Progress Notes (Signed)
Subjective:   Patient: [name]       Birthdate: 11-18-1970       MRN: [id number]      HPI  [name] is a 47 y.o. female presenting for DM f/u.   Type II DM Last seen for this issue on 01/10. At that time had recently been discharged from hospital for asthma exacerbation and was started on insulin during admission. Was initially prescribed Lantus 20U, though this has been increased per Dr. Graylin Shiver recommendation over the past few weeks. Is currently taking 50U Lantus, which she is getting for free through a coupon. Coupon is only good for 50U per day. Says that blood sugars have been consistently in 300s on 50U Lantus qhs. Lowest fasting blood sugar 275. Is still taking metformin as prescribed as well. Is very concerned that her blood sugar is not improving on what she considers to be a very high dose of insulin.   Bipolar disorder [facility]. Had a very difficult week last week as this was the anniversary of her mother's death as well as Valentine's Day, which always reminds her of loved ones she has lost, primarily her mother and brother. Had thoughts of self-harm, so she immediately sought care at [facility]. Vraylar was added to her med regimen, and she says she has been feeling great for the past two days. No thoughts of self harm or HI. She was also given the crisis hotline number at [facility] which she is pleased about. Next appt is in 6 weeks. Patient has been receiving Ativan from [facility]. Last prescribed three months of 90 tabs in September. Says she has one addition refill left. She thinks it is very important for her to remain on Ativan as it makes her feel safe. Says that [facility] does not prescribe Ativan.   Smoking status reviewed. Patient is never smoker.   Review of Systems See HPI.     Objective:  Physical Exam  Constitutional: She is oriented to person, place, and time and well-developed, well-nourished, and in no distress.  HENT:  Head: Normocephalic  and atraumatic.  Pulmonary/Chest: No respiratory distress.  Neurological: She is alert and oriented to person, place, and time.  Skin: Skin is warm and dry.  Psychiatric: Affect and judgment normal.    Assessment & Plan:  Type II diabetes mellitus with complication (HCC) Uncontrolled. Persistently elevated blood sugars in 300s despite 50U Lantus qhs. As such, will increase basal insulin today. As patient's Lantus coupon not applicable for doses >99M per day, will switch to basaglar, which is reportedly her insurance's preferred basal insulin. Will begin at 60U basaglar qhs, and increase by 2U nightly for blood sugar >150. Patient to schedule appt with Dr. Valentina Lucks in about one month to discuss possibly adding additional agents if necessary. Patient to f/u with me in two months to see how she is doing with med regimen. A1C due next month.   Bipolar disorder ([place]) Diagnosed by and followed by [facility] for this issue. Currently prescribed clonidine, Prozac, mirtazepine, buspirone, and as of last week Vraylar. Patient has been receiving Ativan from [facility] and requested refill today. Discussed that it is not the best care to have two providers treating the same issue, especially as Beverly Sessions is using medications that are not typically used at [facility] and are beyond our spectrum of care. Patient very concerned about stopping Ativan. Discussed that Ativan in general is not a medication to rely upon, so it would be best  care for her to discontinue anyway. Will provide one additional refill so patient has enough until her next [facility] appt in 6 weeks, however informed patient that I will not be providing any additional Ativan refills after that, and she needs to come up with a plan with [facility] for that.   Precepted with Dr. Valentina Lucks.   Adin Hector, MD, MPH PGY-3 [facility] Pager [phone]

## 2017-06-25 ENCOUNTER — Ambulatory Visit: Payer: BC Managed Care – PPO | Admitting: Internal Medicine

## 2017-06-25 VITALS — BP 160/80 | HR 109 | Temp 99.5°F | Wt >= 6400 oz

## 2017-06-25 DIAGNOSIS — E118 Type 2 diabetes mellitus with unspecified complications: Secondary | ICD-10-CM | POA: Diagnosis not present

## 2017-06-25 DIAGNOSIS — J029 Acute pharyngitis, unspecified: Secondary | ICD-10-CM

## 2017-06-25 DIAGNOSIS — F319 Bipolar disorder, unspecified: Secondary | ICD-10-CM | POA: Diagnosis not present

## 2017-06-25 LAB — POCT RAPID STREP A (OFFICE): RAPID STREP A SCREEN: NEGATIVE

## 2017-06-25 MED ORDER — SEMAGLUTIDE(0.25 OR 0.5MG/DOS) 2 MG/1.5ML ~~LOC~~ SOPN: 0.2500 mg | PEN_INJECTOR | SUBCUTANEOUS | 0 refills | Status: DC

## 2017-06-25 NOTE — Progress Notes (Signed)
Subjective:   Patient: [name]       Birthdate: 06-08-1970       MRN: [id number]      HPI  [name] is a 47 y.o. female presenting for f/u of diabetes and bipolar disorder.   Type II DM Patient says her diabetes remains poorly controlled. Is currently taking 75U basaglar as well as metformin 500mg  qd. Says CBGs at typically between 257-300 before bed, and fasting in 220s. Lowest CBG has been 140. She is very worried about her blood sugars. She also would like to switch back to Lantus as now she has a coupon and can get this for free.   Bipolar disorder At last appt on 02/20, discussed with patient that we will not be continuing Ativan. Patient receiving all of her psychiatric care at [facility], however Ativan had been started many years ago at [facility] and had been continued to be refilled. Discussed with patient that continuing Ativan was not providing best care for her, and also that having two providers (myself and [facility]) managing the same problem is not in patient's best interest, as there is possibility for miscommunication etc. Provided patient with one additional Ativan refill to use while she came up with a plan for [facility] regarding Ativan, but was very transparent that no additional Ativan refills would be provided at [facility].  Today, patient is very upset that she will not be receiving Ativan. Her father died one week ago, and she says she is having a very hard time controlling her symptoms after that. Of note, patient extremely tearful throughout entire interview, both about her father and about Ativan. Offered to reschedule appt for patient without charging her for today's appt as she was so distraught, however patient declined. Patient says that she needs Ativan, and she does not understand why I will not continue to describe it. Patient says she and her psychiatrist at [facility] thinks that Ativan needs to be weaned. Patient still has the majority of Ativan tablets left that were  prescribed to her two months ago, and says she only takes one if her symptoms are very extreme. She says just knowing that she has the tablets is sufficient. Patient says that she feels as if she is not being listened to, because she is saying she needs Ativan and I will not prescribe it. Patient also acknowledges that [facility] specifically does not prescribe Ativan. She last went to [facility] yesterday, and feels that she is receiving good psychiatric care there and is pleased with current med regimen.   Sore throat Patient reported sore throat to CNA. Strep test performed. Was not able to discuss this further, as rest of appt took almost an hour and patient felt discussing aforementioned Ativan situation was more important.   Smoking status reviewed. Patient is never smoker.   Review of Systems See HPI.     Objective:  Physical Exam  Constitutional: She is oriented to person, place, and time.  Morbidly obese female  HENT:  Head: Normocephalic and atraumatic.  Pulmonary/Chest: Effort normal. No respiratory distress.  Neurological: She is alert and oriented to person, place, and time.  Psychiatric:  Crying, at times uncontrollably, throughout majority of encounter.   Vitals reviewed.    Assessment & Plan:  Type II diabetes mellitus with complication (HCC) Uncontrolled. Still with home blood sugars significantly above goal on 75U basaglar. As such, will add a second agent. Patient given sample of Ozempic today. Will start at 0.25mg  once weekly for  one month, then increase to 0.5mg  once weekly if she is not having much nausea. If low blood sugars at home, patient instructed to call, and will decrease basal insulin dose. Will also return to Lantus per patient's request as she now has coupon and can receive Lantus for free.  Unable to obtain A1C today as patient crying uncontrollably whenever attempting to obtain specimen. Will obtain at next visit.  Precepted with Dr. Valentina Lucks.   Bipolar disorder  [facility]) Managed by [facility], though patient had been receiving Ativan at [facility]. Patient very distraught throughout encounter that she will not continue to receive Ativan. Explained repeatedly in depth the possible dangerous side effects of Ativan. Also pointed out that patient does not need to be weaned, as she is, by her own report, taking Ativan very infrequently. Discussed with patient that what she is describing is a psychological dependence on Ativan rather than physical dependence, as she is not taking it very often. Patient acknowledged this. After lengthy discussion, patient acknowledged understanding the risks of Ativan, and, though she admittedly was not pleased with the decision, said she understood that it was in her best health not to continue Ativan, and accepted that additional refills would not be provided.  Of note, a female relative of patient entered the room abruptly at the end of the encounter after Ativan discussion had ended. Patient pointed at me while addressing patient and said aggressively, "Is she going to give you the prescription?" I informed relative that Ms. Lissa Merlin and I had already discussed her medications, and I would not be prescribing Ativan. Patient was also simultaneously trying to explain to relative that she now understood the negative side effects and we had arrived at a plan to which she was amenable. Relative then said, referred to me, "Tell her to get the papers so you can get a new doctor." I informed relative that it is Ms. Torre' decision to request a new provider or not, as it is her health, not the relative's. Patient not wishing for change of provider form at that time. Completed recap of plan with patient, and relative did not interrupt further.   Sore throat Did not have time to address given length of discussion of other topics during appointment. Strep test negative. If sx continue, patient can schedule another appt.    Adin Hector, MD, MPH PGY-3 [facility] Pager [phone]

## 2017-06-25 NOTE — Patient Instructions (Signed)
It was nice seeing you again today [name].  I am very sorry to hear about the passing of your father. You are a very strong woman, and I know you will make it through this difficult time.   Please begin taking Ozempic 0.25 mg once a week. If you are tolerating the medication well after a month at 0.25 mg, you can increase to 0.5 mg once a week.   Please continue to follow with [facility] as you have been.   I will see you back in six weeks to see how you are doing.   If you have any questions or concerns, please feel free to call the clinic.   Be well,  Dr. Avon Gully

## 2017-06-26 ENCOUNTER — Encounter: Payer: Self-pay | Admitting: Internal Medicine

## 2017-06-26 NOTE — Assessment & Plan Note (Signed)
Managed by [facility], though patient had been receiving Ativan at [facility]. Patient very distraught throughout encounter that she will not continue to receive Ativan. Explained repeatedly in depth the possible dangerous side effects of Ativan. Also pointed out that patient does not need to be weaned, as she is, by her own report, taking Ativan very infrequently. Discussed with patient that what she is describing is a psychological dependence on Ativan rather than physical dependence, as she is not taking it very often. Patient acknowledged this. After lengthy discussion, patient acknowledged understanding the risks of Ativan, and, though she admittedly was not pleased with the decision, said she understood that it was in her best health not to continue Ativan, and accepted that additional refills would not be provided.  Of note, a female relative of patient entered the room abruptly at the end of the encounter after Ativan discussion had ended. Patient pointed at me while addressing patient and said aggressively, "Is she going to give you the prescription?" I informed relative that Ms. Lissa Merlin and I had already discussed her medications, and I would not be prescribing Ativan. Patient was also simultaneously trying to explain to relative that she now understood the negative side effects and we had arrived at a plan to which she was amenable. Relative then said, referred to me, "Tell her to get the papers so you can get a new doctor." I informed relative that it is Ms. Kerwin' decision to request a new provider or not, as it is her health, not the relative's. Patient not wishing for change of provider form at that time. Completed recap of plan with patient, and relative did not interrupt further.

## 2017-06-26 NOTE — Assessment & Plan Note (Addendum)
Uncontrolled. Still with home blood sugars significantly above goal on 75U basaglar. As such, will add a second agent. Patient given sample of Ozempic today. Will start at 0.25mg  once weekly for one month, then increase to 0.5mg  once weekly if she is not having much nausea. If low blood sugars at home, patient instructed to call, and will decrease basal insulin dose. Will also return to Lantus per patient's request as she now has coupon and can receive Lantus for free.  Unable to obtain A1C today as patient crying uncontrollably whenever attempting to obtain specimen. Will obtain at next visit.  Precepted with Dr. Valentina Lucks.

## 2017-07-21 ENCOUNTER — Telehealth: Payer: Self-pay

## 2017-07-21 NOTE — Telephone Encounter (Signed)
Pt called nurse line, states she was given a sample of Ozempic, and would like to have Rx sent to [place] Wallace Cullens, RN

## 2017-07-22 ENCOUNTER — Other Ambulatory Visit: Payer: Self-pay | Admitting: Internal Medicine

## 2017-07-22 MED ORDER — SEMAGLUTIDE(0.25 OR 0.5MG/DOS) 2 MG/1.5ML ~~LOC~~ SOPN: 0.2500 mg | PEN_INJECTOR | SUBCUTANEOUS | 2 refills | Status: DC

## 2017-07-22 NOTE — Progress Notes (Signed)
Prescription for Ozempic sent to pharmacy for patient.   Adin Hector, MD, MPH PGY-3 [place] [facility] Pager [phone]

## 2017-08-19 ENCOUNTER — Other Ambulatory Visit: Payer: Self-pay | Admitting: Internal Medicine

## 2017-08-19 ENCOUNTER — Other Ambulatory Visit: Payer: Self-pay

## 2017-08-19 NOTE — Telephone Encounter (Signed)
Received fax from pharmacy that Lantus requires PA and insurance prefers Basaglar, [place], and [place].   Appears that Nancee Liter was prescribed in past although Lantus still on med list and prescribed on 08/19/17.  Please clarify if PA needs to be done on Lantus or please send Basaglar instead.  Danley Danker, RN [facility] RN)

## 2017-08-20 NOTE — Telephone Encounter (Signed)
Called patient regarding insulin. Patient initially on Lantus, then switched to basaglar when insurance would no longer cover insulin, then switched back to Lantus when patient obtained a coupon card that will allow her to get Lantus for free. Received refill requests for both Lantus and basaglar, and was also notified that Lantus would require prior authorization, as is not preferred by her insurance. Spoke with patient regarding this. Patient has already picked up Lantus from [facility] using coupon card, and does not currently need any additional refills. No further action at this time.   Adin Hector, MD, MPH PGY-3 [place] [facility] Pager [phone]

## 2017-10-05 ENCOUNTER — Ambulatory Visit: Payer: BC Managed Care – PPO | Admitting: Family Medicine

## 2017-10-05 VITALS — BP 126/82 | HR 100 | Temp 99.2°F | Wt >= 6400 oz

## 2017-10-05 DIAGNOSIS — F411 Generalized anxiety disorder: Secondary | ICD-10-CM

## 2017-10-05 DIAGNOSIS — E118 Type 2 diabetes mellitus with unspecified complications: Secondary | ICD-10-CM

## 2017-10-05 LAB — POCT GLYCOSYLATED HEMOGLOBIN (HGB A1C): HBA1C, POC (CONTROLLED DIABETIC RANGE): 10.4 % — AB (ref 0.0–7.0)

## 2017-10-05 MED ORDER — INSULIN GLARGINE 100 UNIT/ML SOLOSTAR PEN: PEN_INJECTOR | SUBCUTANEOUS | 3 refills | Status: DC

## 2017-10-05 MED ORDER — METFORMIN HCL 500 MG PO TABS: 1000.0000 mg | ORAL_TABLET | Freq: Every day | ORAL | 2 refills | Status: DC

## 2017-10-05 MED ORDER — LORAZEPAM 1 MG PO TABS: 1.0000 mg | ORAL_TABLET | Freq: Three times a day (TID) | ORAL | 0 refills | Status: DC | PRN

## 2017-10-05 NOTE — Assessment & Plan Note (Signed)
Ativan refilled today. I did not want to make any medication changes at this appointment. However, I did discuss a plan for [name] to begin to wean down Ativan that we will discuss at our next appointment. Patient was agreeable with plan to try and wean.

## 2017-10-05 NOTE — Assessment & Plan Note (Signed)
Patient is Currently taking Lantus 70U daily at night and Ozempic 0.25mg  weekly. She was currently on Metformin with no known side effects. I will restart Metformin 500mg  BID today. At her next appointment in one month I will increase Metformin to 1000mg  BID.  Patient is going to record blood sugars over next month and bring them with her to her next appointment. If they are still elevated after taking Metformin I will increase Ozempic as my next act.  Encouraged patient to cont weight loss.

## 2017-10-05 NOTE — Patient Instructions (Addendum)
It was great to meet you today! Thank you for letting me participate in your care!  Today, we discussed your recent weight loss. I am VERY proud of you losing 10lbs. Please keep it up! Continue to exercise and eat a healthy balanced diet. I hope you will continue to lose weight over the next year.  I have refilled your Ativan for your anxiety. We will discuss attempting to wean you down at a later time.  I have refilled your Lantus and I have started you on Metformin 500mg  BID.   Please return in one month and keep a log of your blood sugar. Please check it once in the morning before you eat and once at night before you take Lantus. Bring this with you to your next appointment in one month.  Be well, Harolyn Rutherford, DO PGY-2, [facility]

## 2017-10-05 NOTE — Progress Notes (Signed)
Subjective: Chief Complaint  Patient presents with  . Diabetes    HPI: [name] is a 47 y.o. presenting to clinic today to discuss the following:  Diabetes Mellitus Type 2 - poorly controlled Patient is currently taking Lantus 70U daily at night and Ozempic once a week. Her A1c today is unchanged from 7 months ago; 10.4. She is not checking her blood sugar on a regular basis. Patient has lost 10lbs since last appointment. She is motivated to continue losing weight.  Anxiety and Depression Patient is being seen and treated at [facility] and states she likes it there. She is currently on several anti-depression/antianxiety mediations. Patient states she is not depressed currently is taking all listed medications and feels well. 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 She was also anxious about meeting me today. Requests refill of Ativan today.  Health Maintenance: foot exam, opthalmology exam     ROS noted in HPI.   Past Medical, Surgical, Social, and Family History Reviewed & Updated per EMR.   Pertinent Historical Findings include:   Social History   Tobacco Use  Smoking Status Never Smoker  Smokeless Tobacco Never Used    Objective: BP 126/82   Pulse 100   Temp 99.2 F (37.3 C)   Wt (!) 405 lb 9.6 oz (184 kg)   SpO2 94%   BMI 56.57 kg/m  Vitals and nursing notes reviewed  Physical Exam Gen: Alert and Oriented x 3, NAD HEENT: Normocephalic, atraumatic, PERRLA, EOMI CV: Reg rhythm, tachycardia, no murmurs, faint heart sounds, normal S1, S2 split, +2 pulses dorsalis pedis bilaterally Resp: CTAB, no wheezing, rales, or rhonchi, comfortable work of breathing MSK: Moves all four extremities Ext: no clubbing, cyanosis, or edema Skin: warm, dry, intact, no rashes Psych: appropriate behavior, mood, somewhat anxious  Results for orders placed or performed in visit on 10/05/17 (from the past 72 hour(s))  HgB A1c      Status: Abnormal   Collection Time: 10/05/17  1:36 PM  Result Value Ref Range   Hemoglobin A1C  4.0 - 5.6 %   HbA1c POC (<> result, manual entry)  4.0 - 5.6 %   HbA1c, POC (prediabetic range)  5.7 - 6.4 %   HbA1c, POC (controlled diabetic range) 10.4 (A) 0.0 - 7.0 %    Assessment/Plan:  Anxiety state Ativan refilled today. I did not want to make any medication changes at this appointment. However, I did discuss a plan for [name] to begin to wean down Ativan that we will discuss at our next appointment. Patient was agreeable with plan to try and wean.  Type II diabetes mellitus with complication [facility]) Patient is Currently taking Lantus 70U daily at night and Ozempic 0.25mg  weekly. She was currently on Metformin with no known side effects. I will restart Metformin 500mg  BID today. At her next appointment in one month I will increase Metformin to 1000mg  BID.  Patient is going to record blood sugars over next month and bring them with her to her next appointment. If they are still elevated after taking Metformin I will increase Ozempic as my next act.  Encouraged patient to cont weight loss.   PATIENT EDUCATION PROVIDED: See AVS    Diagnosis and plan along with any newly prescribed medication(s) were discussed in detail with this patient today. The patient verbalized understanding and agreed with the plan. Patient advised if symptoms worsen return to clinic or ER.  Health Maintainance:   Orders Placed This Encounter  Procedures  . HgB A1c    Meds ordered this encounter  Medications  . LORazepam (ATIVAN) 1 MG tablet    Sig: Take 1 tablet (1 mg total) by mouth every 8 (eight) hours as needed for anxiety. Do not fill <30 days from last refill    Dispense:  90 tablet    Refill:  0  . Insulin Glargine (LANTUS SOLOSTAR) 100 UNIT/ML Solostar Pen    Sig: INJECT 50 UNITS SUBCUTANEOUSLY ONCE DAILY AT  10  PM    Dispense:  15 pen    Refill:  3    Please consider 90 day supplies to promote  better adherence  . metFORMIN (GLUCOPHAGE) 500 MG tablet    Sig: Take 2 tablets (1,000 mg total) by mouth daily with breakfast.    Dispense:  60 tablet    Refill:  2     Harolyn Rutherford, DO 10/05/2017, 1:36 PM PGY-2 [place]

## 2017-10-05 NOTE — Assessment & Plan Note (Signed)
>>  ASSESSMENT AND PLAN FOR ANXIETY WRITTEN ON 10/05/2017 10:09 PM BY LOCKAMY, TIMOTHY, DO  Ativan refilled today. I did not want to make any medication changes at this appointment. However, I did discuss a plan for [name] to begin to wean down Ativan that we will discuss at our next appointment. Patient was agreeable with plan to try and wean.

## 2017-10-08 ENCOUNTER — Telehealth: Payer: Self-pay | Admitting: Family Medicine

## 2017-10-08 NOTE — Telephone Encounter (Signed)
LVM - Return in about 1 month (around 11/05/2017) for DM and anxiety

## 2017-11-13 ENCOUNTER — Emergency Department (HOSPITAL_COMMUNITY): Payer: BC Managed Care – PPO

## 2017-11-13 ENCOUNTER — Other Ambulatory Visit: Payer: Self-pay

## 2017-11-13 ENCOUNTER — Ambulatory Visit (INDEPENDENT_AMBULATORY_CARE_PROVIDER_SITE_OTHER): Payer: BC Managed Care – PPO | Admitting: Family Medicine

## 2017-11-13 ENCOUNTER — Inpatient Hospital Stay (HOSPITAL_COMMUNITY)
Admission: EM | Admit: 2017-11-13 | Discharge: 2017-11-14 | DRG: 203 | Disposition: A | Discharge status: Home / Self Care | Payer: BC Managed Care – PPO | Source: Ambulatory Visit | Attending: Family Medicine | Admitting: Family Medicine

## 2017-11-13 VITALS — BP 153/95 | HR 109 | Temp 98.6°F

## 2017-11-13 DIAGNOSIS — E1122 Type 2 diabetes mellitus with diabetic chronic kidney disease: Secondary | ICD-10-CM | POA: Diagnosis present

## 2017-11-13 DIAGNOSIS — D509 Iron deficiency anemia, unspecified: Secondary | ICD-10-CM | POA: Diagnosis present

## 2017-11-13 DIAGNOSIS — Z825 Family history of asthma and other chronic lower respiratory diseases: Secondary | ICD-10-CM

## 2017-11-13 DIAGNOSIS — I129 Hypertensive chronic kidney disease with stage 1 through stage 4 chronic kidney disease, or unspecified chronic kidney disease: Secondary | ICD-10-CM | POA: Diagnosis present

## 2017-11-13 DIAGNOSIS — F411 Generalized anxiety disorder: Secondary | ICD-10-CM | POA: Diagnosis present

## 2017-11-13 DIAGNOSIS — Z91120 Patient's intentional underdosing of medication regimen due to financial hardship: Secondary | ICD-10-CM

## 2017-11-13 DIAGNOSIS — E039 Hypothyroidism, unspecified: Secondary | ICD-10-CM | POA: Diagnosis present

## 2017-11-13 DIAGNOSIS — Z7982 Long term (current) use of aspirin: Secondary | ICD-10-CM | POA: Diagnosis not present

## 2017-11-13 DIAGNOSIS — Z23 Encounter for immunization: Secondary | ICD-10-CM | POA: Diagnosis not present

## 2017-11-13 DIAGNOSIS — N189 Chronic kidney disease, unspecified: Secondary | ICD-10-CM

## 2017-11-13 DIAGNOSIS — J45909 Unspecified asthma, uncomplicated: Secondary | ICD-10-CM | POA: Diagnosis present

## 2017-11-13 DIAGNOSIS — N1832 Chronic kidney disease, stage 3b: Secondary | ICD-10-CM

## 2017-11-13 DIAGNOSIS — E1165 Type 2 diabetes mellitus with hyperglycemia: Secondary | ICD-10-CM | POA: Diagnosis present

## 2017-11-13 DIAGNOSIS — Z794 Long term (current) use of insulin: Secondary | ICD-10-CM

## 2017-11-13 DIAGNOSIS — E785 Hyperlipidemia, unspecified: Secondary | ICD-10-CM | POA: Diagnosis present

## 2017-11-13 DIAGNOSIS — T380X6A Underdosing of glucocorticoids and synthetic analogues, initial encounter: Secondary | ICD-10-CM | POA: Diagnosis present

## 2017-11-13 DIAGNOSIS — J45901 Unspecified asthma with (acute) exacerbation: Secondary | ICD-10-CM | POA: Diagnosis present

## 2017-11-13 DIAGNOSIS — J4551 Severe persistent asthma with (acute) exacerbation: Secondary | ICD-10-CM | POA: Diagnosis present

## 2017-11-13 DIAGNOSIS — N183 Chronic kidney disease, stage 3 unspecified: Secondary | ICD-10-CM

## 2017-11-13 DIAGNOSIS — E669 Obesity, unspecified: Secondary | ICD-10-CM | POA: Diagnosis present

## 2017-11-13 LAB — CBC WITH DIFFERENTIAL/PLATELET
ABS IMMATURE GRANULOCYTES: 0 10*3/uL (ref 0.0–0.1)
Basophils Absolute: 0 10*3/uL (ref 0.0–0.1)
Basophils Relative: 0 %
EOS ABS: 0.2 10*3/uL (ref 0.0–0.7)
Eosinophils Relative: 2 %
HEMATOCRIT: 35.6 % — AB (ref 36.0–46.0)
HEMOGLOBIN: 10.7 g/dL — AB (ref 12.0–15.0)
Immature Granulocytes: 0 %
LYMPHS ABS: 3 10*3/uL (ref 0.7–4.0)
LYMPHS PCT: 36 %
MCH: 26 pg (ref 26.0–34.0)
MCHC: 30.1 g/dL (ref 30.0–36.0)
MCV: 86.4 fL (ref 78.0–100.0)
MONOS PCT: 9 %
Monocytes Absolute: 0.7 10*3/uL (ref 0.1–1.0)
NEUTROS PCT: 53 %
Neutro Abs: 4.4 10*3/uL (ref 1.7–7.7)
Platelets: 381 10*3/uL (ref 150–400)
RBC: 4.12 MIL/uL (ref 3.87–5.11)
RDW: 15.8 % — AB (ref 11.5–15.5)
WBC: 8.3 10*3/uL (ref 4.0–10.5)

## 2017-11-13 LAB — BASIC METABOLIC PANEL
ANION GAP: 10 (ref 5–15)
BUN: 15 mg/dL (ref 6–20)
CHLORIDE: 105 mmol/L (ref 98–111)
CO2: 26 mmol/L (ref 22–32)
Calcium: 9 mg/dL (ref 8.9–10.3)
Creatinine, Ser: 1.34 mg/dL — ABNORMAL HIGH (ref 0.44–1.00)
GFR calc Af Amer: 53 mL/min — ABNORMAL LOW (ref >60)
GFR calc non Af Amer: 46 mL/min — ABNORMAL LOW (ref >60)
GLUCOSE: 177 mg/dL — AB (ref 70–99)
POTASSIUM: 3.9 mmol/L (ref 3.5–5.1)
Sodium: 141 mmol/L (ref 135–145)

## 2017-11-13 LAB — GLUCOSE, CAPILLARY
Glucose-Capillary: 256 mg/dL — ABNORMAL HIGH (ref 70–99)
Glucose-Capillary: 372 mg/dL — ABNORMAL HIGH (ref 70–99)

## 2017-11-13 LAB — I-STAT VENOUS BLOOD GAS, ED
Acid-base deficit: 1 mmol/L (ref 0.0–2.0)
BICARBONATE: 27.7 mmol/L (ref 20.0–28.0)
O2 Saturation: 54 %
PCO2 VEN: 65.2 mmHg — AB (ref 44.0–60.0)
PO2 VEN: 35 mmHg (ref 32.0–45.0)
TCO2: 30 mmol/L (ref 22–32)
pH, Ven: 7.235 — ABNORMAL LOW (ref 7.250–7.430)

## 2017-11-13 LAB — TSH: TSH: 3.247 u[IU]/mL (ref 0.350–4.500)

## 2017-11-13 LAB — MRSA PCR SCREENING: MRSA by PCR: NEGATIVE

## 2017-11-13 LAB — MAGNESIUM: Magnesium: 1.3 mg/dL — ABNORMAL LOW (ref 1.7–2.4)

## 2017-11-13 MED ORDER — METHYLPREDNISOLONE SODIUM SUCC 125 MG IJ SOLR
125.0000 mg | Freq: Once | INTRAMUSCULAR | Status: AC
  Administered 2017-11-13: 125 mg via INTRAVENOUS
  Filled 2017-11-13: qty 2

## 2017-11-13 MED ORDER — IPRATROPIUM-ALBUTEROL 0.5-2.5 (3) MG/3ML IN SOLN
3.0000 mL | Freq: Once | RESPIRATORY_TRACT | Status: AC
  Administered 2017-11-13: 3 mL via RESPIRATORY_TRACT

## 2017-11-13 MED ORDER — LORAZEPAM 1 MG PO TABS
1.0000 mg | ORAL_TABLET | Freq: Three times a day (TID) | ORAL | Status: DC | PRN
  Administered 2017-11-13: 1 mg via ORAL
  Filled 2017-11-13: qty 1

## 2017-11-13 MED ORDER — INSULIN ASPART 100 UNIT/ML ~~LOC~~ SOLN
0.0000 [IU] | Freq: Three times a day (TID) | SUBCUTANEOUS | Status: DC
  Administered 2017-11-14: 11 [IU] via SUBCUTANEOUS
  Administered 2017-11-14: 15 [IU] via SUBCUTANEOUS

## 2017-11-13 MED ORDER — FLUTICASONE FUROATE-VILANTEROL 100-25 MCG/INH IN AEPB
1.0000 | INHALATION_SPRAY | Freq: Every day | RESPIRATORY_TRACT | Status: DC
  Administered 2017-11-14: 1 via RESPIRATORY_TRACT
  Filled 2017-11-13: qty 28

## 2017-11-13 MED ORDER — INSULIN GLARGINE 100 UNIT/ML ~~LOC~~ SOLN
35.0000 [IU] | Freq: Every day | SUBCUTANEOUS | Status: DC
  Administered 2017-11-13: 35 [IU] via SUBCUTANEOUS
  Filled 2017-11-13: qty 0.35

## 2017-11-13 MED ORDER — FLUOXETINE HCL 20 MG PO CAPS
80.0000 mg | ORAL_CAPSULE | Freq: Every day | ORAL | Status: DC
  Administered 2017-11-14: 80 mg via ORAL
  Filled 2017-11-13: qty 4

## 2017-11-13 MED ORDER — ALBUTEROL SULFATE (2.5 MG/3ML) 0.083% IN NEBU: 2.5000 mg | INHALATION_SOLUTION | RESPIRATORY_TRACT | Status: DC | PRN

## 2017-11-13 MED ORDER — INSULIN ASPART 100 UNIT/ML ~~LOC~~ SOLN
0.0000 [IU] | Freq: Three times a day (TID) | SUBCUTANEOUS | Status: DC
  Administered 2017-11-13: 11 [IU] via SUBCUTANEOUS

## 2017-11-13 MED ORDER — BUSPIRONE HCL 10 MG PO TABS
10.0000 mg | ORAL_TABLET | Freq: Three times a day (TID) | ORAL | Status: DC
Start: 2017-11-13 — End: 2017-11-14
  Administered 2017-11-13 – 2017-11-14 (×2): 10 mg via ORAL
  Filled 2017-11-13 (×5): qty 1

## 2017-11-13 MED ORDER — ALBUTEROL SULFATE (2.5 MG/3ML) 0.083% IN NEBU
5.0000 mg | INHALATION_SOLUTION | Freq: Once | RESPIRATORY_TRACT | Status: DC
  Filled 2017-11-13: qty 6

## 2017-11-13 MED ORDER — FAMOTIDINE 20 MG PO TABS
20.0000 mg | ORAL_TABLET | Freq: Every day | ORAL | Status: DC
  Administered 2017-11-13 – 2017-11-14 (×2): 20 mg via ORAL
  Filled 2017-11-13 (×2): qty 1

## 2017-11-13 MED ORDER — IPRATROPIUM-ALBUTEROL 0.5-2.5 (3) MG/3ML IN SOLN
3.0000 mL | RESPIRATORY_TRACT | Status: DC
  Administered 2017-11-14 (×2): 3 mL via RESPIRATORY_TRACT
  Filled 2017-11-13 (×3): qty 3

## 2017-11-13 MED ORDER — IPRATROPIUM-ALBUTEROL 0.5-2.5 (3) MG/3ML IN SOLN
3.0000 mL | RESPIRATORY_TRACT | Status: DC
  Administered 2017-11-13: 3 mL via RESPIRATORY_TRACT

## 2017-11-13 MED ORDER — ALBUTEROL SULFATE (2.5 MG/3ML) 0.083% IN NEBU
2.5000 mg | INHALATION_SOLUTION | Freq: Once | RESPIRATORY_TRACT | Status: AC
  Administered 2017-11-13: 2.5 mg via RESPIRATORY_TRACT

## 2017-11-13 MED ORDER — LORAZEPAM 2 MG/ML IJ SOLN
0.5000 mg | Freq: Once | INTRAMUSCULAR | Status: AC
  Administered 2017-11-13: 0.5 mg via INTRAVENOUS
  Filled 2017-11-13: qty 1

## 2017-11-13 MED ORDER — ALBUTEROL SULFATE (2.5 MG/3ML) 0.083% IN NEBU: 5.0000 mg | INHALATION_SOLUTION | Freq: Once | RESPIRATORY_TRACT | Status: DC

## 2017-11-13 MED ORDER — ASPIRIN EC 81 MG PO TBEC
81.0000 mg | DELAYED_RELEASE_TABLET | Freq: Every day | ORAL | Status: DC
  Administered 2017-11-14: 81 mg via ORAL
  Filled 2017-11-13: qty 1

## 2017-11-13 MED ORDER — LEVOTHYROXINE SODIUM 100 MCG PO TABS
300.0000 ug | ORAL_TABLET | Freq: Every day | ORAL | Status: DC
  Administered 2017-11-14: 300 ug via ORAL
  Filled 2017-11-13: qty 3

## 2017-11-13 MED ORDER — ATORVASTATIN CALCIUM 40 MG PO TABS
40.0000 mg | ORAL_TABLET | Freq: Every day | ORAL | Status: DC
  Administered 2017-11-14: 40 mg via ORAL
  Filled 2017-11-13: qty 1

## 2017-11-13 MED ORDER — IPRATROPIUM-ALBUTEROL 0.5-2.5 (3) MG/3ML IN SOLN
RESPIRATORY_TRACT | Status: AC
  Filled 2017-11-13: qty 3

## 2017-11-13 MED ORDER — SODIUM CHLORIDE 0.9 % IV SOLN
INTRAVENOUS | Status: DC | PRN
  Administered 2017-11-13: 12:00:00 via INTRAVENOUS

## 2017-11-13 MED ORDER — INSULIN ASPART 100 UNIT/ML ~~LOC~~ SOLN
0.0000 [IU] | Freq: Every day | SUBCUTANEOUS | Status: DC
  Administered 2017-11-13: 5 [IU] via SUBCUTANEOUS

## 2017-11-13 MED ORDER — MAGNESIUM SULFATE 2 GM/50ML IV SOLN
2.0000 g | Freq: Once | INTRAVENOUS | Status: AC
  Administered 2017-11-14: 2 g via INTRAVENOUS
  Filled 2017-11-13: qty 50

## 2017-11-13 MED ORDER — LISINOPRIL 5 MG PO TABS
5.0000 mg | ORAL_TABLET | Freq: Every day | ORAL | Status: DC
  Administered 2017-11-14: 5 mg via ORAL
  Filled 2017-11-13: qty 1

## 2017-11-13 MED ORDER — PREDNISONE 20 MG PO TABS
50.0000 mg | ORAL_TABLET | Freq: Every day | ORAL | Status: DC
  Administered 2017-11-14: 50 mg via ORAL
  Filled 2017-11-13 (×2): qty 2

## 2017-11-13 MED ORDER — ENOXAPARIN SODIUM 100 MG/ML ~~LOC~~ SOLN
90.0000 mg | SUBCUTANEOUS | Status: DC
  Administered 2017-11-13: 90 mg via SUBCUTANEOUS
  Filled 2017-11-13: qty 1

## 2017-11-13 MED ORDER — CARIPRAZINE HCL 3 MG PO CAPS
3.0000 mg | ORAL_CAPSULE | Freq: Every day | ORAL | Status: DC
  Administered 2017-11-14: 3 mg via ORAL
  Filled 2017-11-13: qty 1

## 2017-11-13 MED ORDER — IPRATROPIUM BROMIDE 0.02 % IN SOLN
0.5000 mg | Freq: Once | RESPIRATORY_TRACT | Status: DC
  Administered 2017-11-13: 0.5 mg via RESPIRATORY_TRACT
  Filled 2017-11-13: qty 2.5

## 2017-11-13 MED ORDER — INSULIN GLARGINE 100 UNIT/ML ~~LOC~~ SOLN: 25.0000 [IU] | Freq: Every day | SUBCUTANEOUS | Status: DC

## 2017-11-13 MED ORDER — MIRTAZAPINE 15 MG PO TABS
15.0000 mg | ORAL_TABLET | Freq: Every day | ORAL | Status: DC
  Administered 2017-11-14: 15 mg via ORAL
  Filled 2017-11-13: qty 1

## 2017-11-13 MED ORDER — ALBUTEROL (5 MG/ML) CONTINUOUS INHALATION SOLN
10.0000 mg/h | INHALATION_SOLUTION | Freq: Once | RESPIRATORY_TRACT | Status: AC
  Administered 2017-11-13: 10 mg/h via RESPIRATORY_TRACT

## 2017-11-13 MED ORDER — MAGNESIUM SULFATE 2 GM/50ML IV SOLN
2.0000 g | Freq: Once | INTRAVENOUS | Status: AC
  Administered 2017-11-13: 2 g via INTRAVENOUS
  Filled 2017-11-13: qty 50

## 2017-11-13 MED ORDER — METOPROLOL SUCCINATE ER 25 MG PO TB24
25.0000 mg | ORAL_TABLET | Freq: Every day | ORAL | Status: DC
  Administered 2017-11-14: 25 mg via ORAL
  Filled 2017-11-13 (×2): qty 1

## 2017-11-13 MED ORDER — CLONIDINE HCL 0.1 MG PO TABS
0.1000 mg | ORAL_TABLET | Freq: Every day | ORAL | Status: DC
Start: 2017-11-13 — End: 2017-11-14
  Administered 2017-11-14: 0.1 mg via ORAL
  Filled 2017-11-13: qty 1

## 2017-11-13 NOTE — ED Notes (Signed)
2 RNs attempt at IV and unsuccessful. Another RN to attempt.

## 2017-11-13 NOTE — Progress Notes (Signed)
   CC: Shortness of breath   HPI  Patient presents today for same-day appointment after calling our line this morning saying she is shortness of breath and out of meds.  She reports she has run out of both albuterol and Brio secondary to cost.  Is been more than a week.  On arrival she was triaged and placed into her room due to audible wheezing and tachypnea.  She does have a history of anxiety.  On chart review, she has been admitted several times for asthma, never intubated.  She is quite anxious today about being short of breath, says this is very scary.  She was started on DuoNeb immediately prior to my assessment, and we will continue another albuterol treatment.  No recent URI or febrile illness.  No sick contacts.  ROS: Unable to perform extensive ROS secondary to tachypnea and requiring neb.  CC, SH/smoking status, and VS noted  Objective: BP (!) 153/95 Comment: repeat BP after initial neb tx  Pulse (!) 109   Temp 98.6 F (37 C) (Axillary)   LMP  (LMP Unknown)   SpO2 97%  Gen: NAD, alert, tearful. HEENT: NCAT, EOMI, PERRL CV: RRR, no murmur Resp: Tight, minimal wheeze throughout, tachypnea, use of accessory muscles, poor air movement. Ext: No edema, warm Neuro: Alert and oriented, Speech clear, No gross deficits  Assessment and plan:  Acute asthma exacerbation: Patient in respiratory distress on presentation.  Mildly improved with DuoNeb and albuterol.  However patient needs transportation via EMS to the emergency room for further treatment.  Likely needs continuous albuterol and certainly steroids.  Counseled patient that she will likely need to stay in the hospital overnight.  RN spoke to her husband who will meet them there. EMS arrived and transported. Appreciate RN and attending help.   Ralene Ok, MD, PGY3 11/13/2017 9:45 AM

## 2017-11-13 NOTE — Patient Instructions (Signed)
Patient sent by EMS to emergency room

## 2017-11-13 NOTE — H&P (Signed)
[facility] Admission History and Physical Service Pager: [phone]  Patient name: [name] Medical record number: [id number] Date of birth: 14-Sep-1970 Age: 47 y.o. Gender: female  Primary Care Provider: Nuala Alpha, DO Consultants: none Code Status: Partial Code (discussed with patient in ED). Pt does not wish to receive chest compressions or cardioversion. She requests intubation if needed.   Chief Complaint: SOB  Assessment and Plan: [name] is a 47 y.o. female presenting with shortness of breath. PMH is significant for IDDM, hypothyroidism, obesity, hypertension, CKD 3, IDA, allergic rhinitis, HLD, anxiety/OCD/SI  #Acute asthma exacerbation Patient was seen a same-day appointment at [facility] family practice center this morning.  On arrival she was triaged due to audible wheezing and tachypnea.  She reports that she has had 4 admits in the last 2 years for his asthma exacerbation.  She has never required intubation.  She reports running out of her medications 1 month ago.  In clinic she was started on duo nebs and albuterol.  In the ED, she presented with nasal flaring and required BiPAP.  She was treated with continuous albuterol and Solu-Medrol.  She was given 2 g of magnesium in the ED.  Her glucose was 177.  At home she is on albuterol inhaler, Flonase nasal spray, DuoNeb solution, Brio  We will continue patient on BiPAP and wean as tolerated  Consult care management as patient has financial struggles and it is difficult for her to afford her medications.  Schedule II grams IV magnesium for tomorrow morning  Prednisone 50 mg daily with breakfast for 10 days (9/7-9/16), and taper  Duo nebs every 4 hours scheduled  Patient has IV access and will keep KVO for administration of IV medications as patient is n.p.o.  #Upper Airway Cough Syndrome  Famotidine 20mg  at home daily. Continue here.   #Hypertension  Patient is on metoprolol 24  hours 25 mg tablet oral daily, lisinopril 5 mg daily, clonidine 0.1 mg daily at home.  We will continue all home medications.  Aspirin 81 mg continued for primary prevention.  #IDDM Last A1c 10.4 in 02/2017   At home patient takes 70 units of Lantus daily at bedtime, metformin 500 mg, lisinopril 5 mg, atorvastatin 40 mg  Starting patient at 35 units Lantus daily at bedtime, with NovoLog sliding scale 3 times daily with meals.  A1c for AM  #CKD III Patient's GFR today is 53 days, and creatinine 1.34.  Her baseline is 1.3  Will avoid any nephrotoxic agents  #Hypothyroidism  At home on 300 mcg tablet.    Last TSH was 2.016 in January 2019  TSH for AM  Continue home synthroid  #Anxiety, chronic  Patient takes BuSpar 10 mg 3 times daily, fluoxetine 80 mg, Ativan 1 mg, mirtazapine 15 mg.  Continue all home medication with exception of ativan. If pt requests ativan, please call MD.  Received 1mg  ativan in ED   #HLD  Continue atorvastatin  #IDA holding ferrous sulfate 325 mg daily  CBC in AM  F: KVO  E: replete PRN  N: NPO until bipap off Prophylaxis: lovenox , weekly Cr.   Disposition: Home, care management for medication.  History of Present Illness:  [name] is a 47 y.o. female presenting with shortness of breath. Patient called clinic this morning because she was unable to breathe. She was seen, given a duoneb treatment ans sent via EMS to ED. Upon arrival to ED, patient placed on BIPAP. She was  given mag x1 and IV solumedrol. She reports to [name] that she has been feeling short of breath for 2 days. She has been hopsptialized 4 times with asthma in the past 2 years. Never requiring intubation.   Limited history as patient was on BiPAP and having difficulty breathing.   Patient denies chest pain, headache, abdominal pain, recent URI. Review Of Systems: Per HPI  Patient Active Problem List   Diagnosis Date Noted  . Bipolar disorder ([place]) 04/29/2017  . HLD  (hyperlipidemia) 03/19/2017  . Community acquired pneumonia   . SOB (shortness of breath)   . Asthma exacerbation 03/14/2017  . Weight gain 12/06/2016  . Upper airway cough syndrome 02/22/2016  . MDD (major depressive disorder), recurrent severe, without psychosis ([place]) 05/11/2015  . Essential hypertension   . Vitamin D deficiency 04/11/2015  . Fatigue 04/10/2015  . Non compliance w medication regimen 04/10/2015  . Golfer's elbow 12/22/2014  . Thyroid activity decreased   . Adjustment disorder with mixed anxiety and depressed mood   . Cough 09/20/2014  . Asthma with acute exacerbation 06/22/2014  . Grief at loss of child 06/22/2014  . Dysmenorrhea 11/25/2013  . Frequent urination 10/20/2012  . Furuncle 07/07/2012  . Suicidal ideation 05/27/2011  . Chronic kidney disease (CKD), stage III (moderate) ([place]) 11/06/2010  . ENDOMETRIAL HYPERPLASIA UNSPECIFIED 02/07/2008  . MENORRHAGIA 01/03/2008  . Allergic rhinitis 06/24/2007  . Essential hypertension, benign 02/03/2007  . Hypothyroidism 05/07/2006  . Type II diabetes mellitus with complication ([place]) 53/74/8270  . Morbid obesity ([place]) 05/07/2006  . Iron deficiency anemia 05/07/2006  . Anxiety state 05/07/2006  . OBSESSIVE COMPUL. DISORDER 05/07/2006    Past Medical History: Past Medical History:  Diagnosis Date  . Acute renal failure ([place]) 05/27/10   hemodialysis for 6 weeks  . Anxiety   . Asthma   . Depression   . Diabetes mellitus   . Hypertension   . Rhabdomyolysis 06/06/10   after drug overdose  . Suicide attempt by drug ingestion ([place]) 06/05/10   result rhabdomyolosis and ARF requrining dialysis     Past Surgical History: Past Surgical History:  Procedure Laterality Date  . ANKLE ARTHROSCOPY Right 08/17/2015   Procedure: RIGHT ANKLE ARTHROSCOPY WITH SYNOVECTOMY AND LOOSE BODY EXCISION;  Surgeon: Melrose Nakayama, MD;  Location: [place];  Service: Orthopedics;  Laterality: Right;  . CHOLECYSTECTOMY    . TUBAL LIGATION   1998    Social History: Social History   Tobacco Use  . Smoking status: Never Smoker  . Smokeless tobacco: Never Used  Substance Use Topics  . Alcohol use: No  . Drug use: No   Please also refer to relevant sections of EMR.  Family History: Family History  Problem Relation Age of Onset  . Asthma Father    Allergies and Medications: Allergies  Allergen Reactions  . Ace Inhibitors Other (See Comments)    Patient had acute renal failure requiring hemodialysis after a suicide attempt.  Nephro recommended avoiding use.  . Haldol [Haloperidol Lactate] Other (See Comments)    Tardive dyskinesia  . Metformin And Related Other (See Comments)    Used in her suicide attempt that contributed to severe acute renal failure  . Nsaids Other (See Comments)    Nephro recommended avoiding after acute renal failure requiring hemodialysis associated with a suicide attempt.  . Latex Other (See Comments)   No current facility-administered medications on file prior to encounter.    Current Outpatient Medications on File Prior to Encounter  Medication Sig  Dispense Refill  . albuterol (PROVENTIL HFA;VENTOLIN HFA) 108 (90 Base) MCG/ACT inhaler Inhale 2 puffs into the lungs every 6 (six) hours as needed for wheezing or shortness of breath.    Marland Kitchen aspirin EC 81 MG tablet Take 81 mg by mouth daily.    Marland Kitchen atorvastatin (LIPITOR) 40 MG tablet Take 1 tablet (40 mg total) by mouth daily. 90 tablet 3  . azithromycin (ZITHROMAX) 250 MG tablet Take 1 tablet (250 mg total) by mouth daily. 1 tablet 0  . busPIRone (BUSPAR) 10 MG tablet Take 10 mg by mouth 3 (three) times daily.     . Cariprazine HCl 3 MG CAPS Take 3 mg by mouth daily.    . cloNIDine (CATAPRES) 0.1 MG tablet Take 0.1 mg by mouth daily.    Marland Kitchen docusate sodium (COLACE) 100 MG capsule Take 200 mg by mouth daily as needed.     . famotidine (PEPCID) 20 MG tablet One at bedtime (Patient taking differently: Take 20 mg by mouth at bedtime. One at bedtime) 30  tablet 2  . ferrous sulfate 325 (65 FE) MG tablet Take 1 tablet (325 mg total) by mouth 3 (three) times daily with meals. (Patient taking differently: Take 325 mg by mouth daily. Take every other day with stool softer) 90 tablet 3  . FLUoxetine (PROZAC) 40 MG capsule Take 80 mg by mouth daily.     . fluticasone (FLONASE) 50 MCG/ACT nasal spray Place 2 sprays into both nostrils daily. 16 g 2  . fluticasone furoate-vilanterol (BREO ELLIPTA) 100-25 MCG/INH AEPB Inhale 1 puff into the lungs daily. 2 each 0  . Insulin Glargine (LANTUS SOLOSTAR) 100 UNIT/ML Solostar Pen INJECT 50 UNITS SUBCUTANEOUSLY ONCE DAILY AT  10  PM 15 pen 3  . ipratropium-albuterol (DUONEB) 0.5-2.5 (3) MG/3ML SOLN Take 3 mLs by nebulization 3 (three) times daily as needed. 360 mL 0  . levothyroxine (SYNTHROID, LEVOTHROID) 300 MCG tablet Take 1 tablet (300 mcg total) by mouth daily. 90 tablet 3  . lisinopril (PRINIVIL,ZESTRIL) 5 MG tablet Take 1 tablet (5 mg total) by mouth daily. 30 tablet 0  . LORazepam (ATIVAN) 1 MG tablet Take 1 tablet (1 mg total) by mouth every 8 (eight) hours as needed for anxiety. Do not fill <30 days from last refill 90 tablet 0  . metFORMIN (GLUCOPHAGE) 500 MG tablet Take 2 tablets (1,000 mg total) by mouth daily with breakfast. 60 tablet 2  . metoprolol succinate (TOPROL XL) 25 MG 24 hr tablet Take 1 tablet (25 mg total) by mouth daily. 30 tablet 0  . mirtazapine (REMERON) 15 MG tablet Take 15 mg by mouth daily.    . Semaglutide (OZEMPIC) 0.25 or 0.5 MG/DOSE SOPN Inject 0.25 mg into the skin once a week. 3 mL 2  . Vitamin D, Ergocalciferol, (DRISDOL) 50000 units CAPS capsule Take 1 capsule (50,000 Units total) by mouth every 7 (seven) days. Sunday 12 capsule 0    Objective: BP 114/77   Pulse 95   Temp 98.3 F (36.8 C) (Oral)   Resp 17   Ht 5\' 11"  (1.803 m)   Wt (!) 181 kg   LMP  (LMP Unknown) Comment: "no chance preganant"   SpO2 99%   BMI 55.65 kg/m   BP (!) 118/53   Pulse 95   Temp 98.6  F (37 C) (Oral)   Resp 17   Ht 5\' 11"  (1.803 m)   Wt (!) 188.6 kg   LMP  (LMP Unknown) Comment: "no chance preganant"  SpO2 95%   BMI 57.99 kg/m   Gen: moderately distressed, alert, non-toxic, well-nourished, obese HEENT: Normocephaic, atraumatic. PERRLA, clear conjuctiva, no scleral icterus and injection. Bipap on. Hearing intact.   Neck supple with no LAD, nodules, or gross abnormality.  Nares patent with no discharge.  Maxillary and frontal sinuses nontender to palpation.  Oropharynx without erythema and lesions.  Tonsils nonswollen and without exudate.   CV: Regular rate, tachy.  Normal S1-S2.  No murmur, gallops, S3, S4 appreciated.  Normal capillary refill bilaterally.  Radial pulses 2+ bilaterally. No bilateral lower extremity edema. Resp: Posterior lung fields diffusely wheezing, with adequate air movement throughout Abd: Nontender and nondistended on palpation to all 4 quadrants.  Positive bowel sounds. Skin: No obvious rashes, lesions, or trauma.  Normal turgor.  MSK: Normal ROM. Normal strength and tone.  Psych: Cooperative with exam.  Pleasant. Makes good eye contact.  Labs and Imaging: CBC BMET  Recent Labs  Lab 11/13/17 1125  WBC 8.3  HGB 10.7*  HCT 35.6*  PLT 381   Recent Labs  Lab 11/13/17 1125  NA 141  K 3.9  CL 105  CO2 26  BUN 15  CREATININE 1.34*  GLUCOSE 177*  CALCIUM 9.0     GFR 53 Mg 1.3  Creatinine (1.3 baseline)  Wilber Oliphant, MD 11/13/2017, 2:00 PM PGY-1, [place] Intern pager: [phone], text pages welcome

## 2017-11-13 NOTE — Progress Notes (Signed)
Pt refused BI-PAP for the night and is currently resting comfortably on 2l. BIPAP at bedside if pt needs and RT to cont to monitor.

## 2017-11-13 NOTE — Progress Notes (Signed)
Pt from ED placed back on BIPAP.  Pt tolerating well at this time.  RT will continue to monitor.

## 2017-11-13 NOTE — ED Triage Notes (Signed)
Brought in by [facility].  From [facility].  Coughing up yellow sputum.  SIOB since this morning.  Chills and congestion  Given 10 albuterol atrovent and nebulizer.  Given another albuterol headache

## 2017-11-13 NOTE — Addendum Note (Signed)
Addended by: Therese Sarah on: 11/13/2017 09:59 AM   Modules accepted: Orders

## 2017-11-13 NOTE — ED Provider Notes (Signed)
[facility] EMERGENCY DEPARTMENT Provider Note   CSN: [id number] Arrival date & time: 11/13/17  1003     History   Chief Complaint Chief Complaint  Patient presents with  . Shortness of Breath    HPI [name] is a 47 y.o. female with a history of diabetes mellitus, suicide attempt by drug ingestion, asthma, and HTN who presents to the emergency department by EMS from her PCP's office with a chief complaint of dyspnea.  The patient endorses constant, worsening shortness of breath since yesterday with associated productive cough with yellow sputum, nasal congestion, and chills.  She reports that she has been out of her home Brio and albuterol due to cost for over a month.  She was given a DuoNeb at her PCPs office and a second albuterol treatment in route by EMS.  On arrival, the patient was tachypneic with retractions, accessory muscle use, nasal flaring, and in respiratory distress.  She was clutching her throat and stating a feeling of not getting any air while she was on nasal cannula.  The patient was placed on a nonrebreather and reports that she felt improvement in her breathing.  Retractions and accessory muscle use improved.  She reports associated polydipsia over the last few weeks.  She reports her fasting blood sugar has been 250 and blood sugars have been 300 at night before she has gone to bed.  She denies confusion, abdominal pain, nausea, vomiting, diarrhea, rash.  Level V caveat secondary to acuity of condition.   The history is provided by the patient. The history is limited by the condition of the patient. No language interpreter was used.    Past Medical History:  Diagnosis Date  . Acute renal failure ([place]) 05/27/10   hemodialysis for 6 weeks  . Anxiety   . Asthma   . Depression   . Diabetes mellitus   . Hypertension   . Rhabdomyolysis 06/06/10   after drug overdose  . Suicide attempt by drug ingestion ([place]) 06/05/10   result  rhabdomyolosis and ARF requrining dialysis     Patient Active Problem List   Diagnosis Date Noted  . Bipolar disorder ([place]) 04/29/2017  . HLD (hyperlipidemia) 03/19/2017  . Community acquired pneumonia   . SOB (shortness of breath)   . Asthma exacerbation 03/14/2017  . Weight gain 12/06/2016  . Upper airway cough syndrome 02/22/2016  . MDD (major depressive disorder), recurrent severe, without psychosis ([place]) 05/11/2015  . Essential hypertension   . Vitamin D deficiency 04/11/2015  . Fatigue 04/10/2015  . Non compliance w medication regimen 04/10/2015  . Golfer's elbow 12/22/2014  . Thyroid activity decreased   . Adjustment disorder with mixed anxiety and depressed mood   . Cough 09/20/2014  . Asthma with acute exacerbation 06/22/2014  . Grief at loss of child 06/22/2014  . Dysmenorrhea 11/25/2013  . Frequent urination 10/20/2012  . Furuncle 07/07/2012  . Suicidal ideation 05/27/2011  . Chronic kidney disease (CKD), stage III (moderate) ([place]) 11/06/2010  . ENDOMETRIAL HYPERPLASIA UNSPECIFIED 02/07/2008  . MENORRHAGIA 01/03/2008  . Allergic rhinitis 06/24/2007  . Essential hypertension, benign 02/03/2007  . Hypothyroidism 05/07/2006  . Type II diabetes mellitus with complication ([place]) 93/26/7124  . Morbid obesity ([place]) 05/07/2006  . Iron deficiency anemia 05/07/2006  . Anxiety state 05/07/2006  . OBSESSIVE COMPUL. DISORDER 05/07/2006    Past Surgical History:  Procedure Laterality Date  . ANKLE ARTHROSCOPY Right 08/17/2015   Procedure: RIGHT ANKLE ARTHROSCOPY WITH SYNOVECTOMY AND LOOSE BODY EXCISION;  Surgeon: Melrose Nakayama, MD;  Location: [place] [facility];  Service: Orthopedics;  Laterality: Right;  . CHOLECYSTECTOMY    . TUBAL LIGATION  1998     OB History   None      Home Medications    Prior to Admission medications   Medication Sig Start Date End Date Taking? Authorizing Provider  albuterol (PROVENTIL HFA;VENTOLIN HFA) 108 (90 Base) MCG/ACT inhaler Inhale 2 puffs  into the lungs every 6 (six) hours as needed for wheezing or shortness of breath.    [provider]  aspirin EC 81 MG tablet Take 81 mg by mouth daily.    [provider]  atorvastatin (LIPITOR) 40 MG tablet Take 1 tablet (40 mg total) by mouth daily. 03/19/17   Verner Mould, MD  azithromycin (ZITHROMAX) 250 MG tablet Take 1 tablet (250 mg total) by mouth daily. 03/19/17   Kathrene Alu, MD  busPIRone (BUSPAR) 10 MG tablet Take 10 mg by mouth 3 (three) times daily.     [provider]  Cariprazine HCl 3 MG CAPS Take 3 mg by mouth daily.    [provider]  cloNIDine (CATAPRES) 0.1 MG tablet Take 0.1 mg by mouth daily. 02/12/16   [provider]  docusate sodium (COLACE) 100 MG capsule Take 200 mg by mouth daily as needed.     [provider]  famotidine (PEPCID) 20 MG tablet One at bedtime Patient taking differently: Take 20 mg by mouth at bedtime. One at bedtime 12/04/16   Verner Mould, MD  ferrous sulfate 325 (65 FE) MG tablet Take 1 tablet (325 mg total) by mouth 3 (three) times daily with meals. Patient taking differently: Take 325 mg by mouth daily. Take every other day with stool softer 12/04/16   Verner Mould, MD  FLUoxetine (PROZAC) 40 MG capsule Take 80 mg by mouth daily.     [provider]  fluticasone (FLONASE) 50 MCG/ACT nasal spray Place 2 sprays into both nostrils daily. 12/04/16   Verner Mould, MD  fluticasone furoate-vilanterol (BREO ELLIPTA) 100-25 MCG/INH AEPB Inhale 1 puff into the lungs daily. 03/19/17   Verner Mould, MD  Insulin Glargine (LANTUS SOLOSTAR) 100 UNIT/ML Solostar Pen INJECT 50 UNITS SUBCUTANEOUSLY ONCE DAILY AT  10  PM 10/05/17   Lockamy, Timothy, DO  ipratropium-albuterol (DUONEB) 0.5-2.5 (3) MG/3ML SOLN Take 3 mLs by nebulization 3 (three) times daily as needed. 04/10/16   McKeag, Marylynn Pearson, MD  levothyroxine (SYNTHROID, LEVOTHROID) 300 MCG  tablet Take 1 tablet (300 mcg total) by mouth daily. 12/04/16   Verner Mould, MD  lisinopril (PRINIVIL,ZESTRIL) 5 MG tablet Take 1 tablet (5 mg total) by mouth daily. 03/19/17   Kathrene Alu, MD  LORazepam (ATIVAN) 1 MG tablet Take 1 tablet (1 mg total) by mouth every 8 (eight) hours as needed for anxiety. Do not fill <30 days from last refill 10/05/17   Nuala Alpha, DO  metFORMIN (GLUCOPHAGE) 500 MG tablet Take 2 tablets (1,000 mg total) by mouth daily with breakfast. 10/05/17 01/03/18  Nuala Alpha, DO  metoprolol succinate (TOPROL XL) 25 MG 24 hr tablet Take 1 tablet (25 mg total) by mouth daily. 03/18/17 04/17/17  Kathrene Alu, MD  mirtazapine (REMERON) 15 MG tablet Take 15 mg by mouth daily. 03/09/17   [provider]  Semaglutide (OZEMPIC) 0.25 or 0.5 MG/DOSE SOPN Inject 0.25 mg into the skin once a week. 07/22/17   Verner Mould, MD  Vitamin D, Ergocalciferol, (  DRISDOL) 50000 units CAPS capsule Take 1 capsule (50,000 Units total) by mouth every 7 (seven) days. Sunday 12/04/16   Verner Mould, MD    Family History Family History  Problem Relation Age of Onset  . Asthma Father     Social History Social History   Tobacco Use  . Smoking status: Never Smoker  . Smokeless tobacco: Never Used  Substance Use Topics  . Alcohol use: No  . Drug use: No     Allergies   Ace inhibitors; Haldol [haloperidol lactate]; Metformin and related; Nsaids; and Latex   Review of Systems Review of Systems  Unable to perform ROS: Acuity of condition  Respiratory: Positive for cough and shortness of breath.   Endocrine: Positive for polydipsia.     Physical Exam Updated Vital Signs BP (!) 118/53   Pulse 95   Temp 98.6 F (37 C) (Oral)   Resp 17   Ht 5\' 11"  (1.803 m)   Wt (!) 188.6 kg   LMP  (LMP Unknown) Comment: "no chance preganant"   SpO2 99%   BMI 57.99 kg/m   Physical Exam  Constitutional: She appears distressed.  Morbidly  obese  HENT:  Head: Normocephalic.  Mouth/Throat: Oropharynx is clear and moist.  Eyes: Conjunctivae are normal.  Neck: Normal range of motion. Neck supple.  Cardiovascular: Regular rhythm. Tachycardia present. Exam reveals no gallop and no friction rub.  No murmur heard. Pulmonary/Chest: Effort normal. No respiratory distress.  Diminished breath sounds throughout.  Tachypnea.  Accessory muscle use and retractions.  Nasal flaring.  Lungs are clear to auscultation.  Abdominal: Soft. Bowel sounds are normal. She exhibits no distension and no mass. There is no tenderness. There is no rebound and no guarding. No hernia.  Musculoskeletal:       Right lower leg: Normal.       Left lower leg: Normal.  Neurological: She is alert.  Skin: Skin is warm. No rash noted.  Psychiatric: Her behavior is normal.  Nursing note and vitals reviewed.  ED Treatments / Results  Labs (all labs ordered are listed, but only abnormal results are displayed) Labs Reviewed  CBC WITH DIFFERENTIAL/PLATELET - Abnormal; Notable for the following components:      Result Value   Hemoglobin 10.7 (*)    HCT 35.6 (*)    RDW 15.8 (*)    All other components within normal limits  BASIC METABOLIC PANEL - Abnormal; Notable for the following components:   Glucose, Bld 177 (*)    Creatinine, Ser 1.34 (*)    GFR calc non Af Amer 46 (*)    GFR calc Af Amer 53 (*)    All other components within normal limits  MAGNESIUM - Abnormal; Notable for the following components:   Magnesium 1.3 (*)    All other components within normal limits  GLUCOSE, CAPILLARY - Abnormal; Notable for the following components:   Glucose-Capillary 256 (*)    All other components within normal limits  I-STAT VENOUS BLOOD GAS, ED - Abnormal; Notable for the following components:   pH, Ven 7.235 (*)    pCO2, Ven 65.2 (*)    All other components within normal limits  MRSA PCR SCREENING    EKG EKG Interpretation  Date/Time:  Friday November 13 2017 10:14:28 EDT Ventricular Rate:  101 PR Interval:    QRS Duration: 92 QT Interval:  376 QTC Calculation: 488 R Axis:   62 Text Interpretation:  Sinus tachycardia Probable anteroseptal infarct, old Confirmed by  Quintella Reichert [phone]) on 11/13/2017 10:33:06 AM   Radiology Dg Chest Portable 1 View  Result Date: 11/13/2017 CLINICAL DATA:  Chest pain and shortness of breath. EXAM: PORTABLE CHEST 1 VIEW COMPARISON:  03/15/2017 FINDINGS: The heart size and mediastinal contours are within normal limits. Both lungs are clear. The visualized skeletal structures are unremarkable. IMPRESSION: Normal exam. Electronically Signed   By: Lorriane Shire M.D.   On: 11/13/2017 11:01    Procedures .Critical Care Performed by: Joanne Gavel, PA-C Authorized by: Joanne Gavel, PA-C   Critical care provider statement:    Critical care time (minutes):  35   Critical care was necessary to treat or prevent imminent or life-threatening deterioration of the following conditions:  Respiratory failure   Critical care was time spent personally by me on the following activities:  Re-evaluation of patient's condition, ordering and review of radiographic studies, ordering and review of laboratory studies, ordering and performing treatments and interventions, examination of patient, evaluation of patient's response to treatment and pulse oximetry   (including critical care time)  Medications Ordered in ED Medications  0.9 %  sodium chloride infusion ( Intravenous Stopped 11/13/17 1410)  aspirin EC tablet 81 mg (has no administration in time range)  atorvastatin (LIPITOR) tablet 40 mg (has no administration in time range)  cloNIDine (CATAPRES) tablet 0.1 mg (0.1 mg Oral Not Given 11/13/17 1517)  lisinopril (PRINIVIL,ZESTRIL) tablet 5 mg (5 mg Oral Not Given 11/13/17 1517)  metoprolol succinate (TOPROL-XL) 24 hr tablet 25 mg (25 mg Oral Not Given 11/13/17 1519)  cariprazine (VRAYLAR) capsule 3 mg (3 mg Oral Not Given  11/13/17 1517)  busPIRone (BUSPAR) tablet 10 mg (10 mg Oral Not Given 11/13/17 1542)  FLUoxetine (PROZAC) capsule 80 mg (80 mg Oral Not Given 11/13/17 1517)  LORazepam (ATIVAN) tablet 1 mg (has no administration in time range)  mirtazapine (REMERON) tablet 15 mg (15 mg Oral Not Given 11/13/17 1519)  levothyroxine (SYNTHROID, LEVOTHROID) tablet 300 mcg (has no administration in time range)  fluticasone furoate-vilanterol (BREO ELLIPTA) 100-25 MCG/INH 1 puff (has no administration in time range)  enoxaparin (LOVENOX) injection 90 mg (has no administration in time range)  insulin aspart (novoLOG) injection 0-20 Units (11 Units Subcutaneous Given 11/13/17 1618)  ipratropium-albuterol (DUONEB) 0.5-2.5 (3) MG/3ML nebulizer solution 3 mL (3 mLs Nebulization Given 11/13/17 1455)  albuterol (PROVENTIL) (2.5 MG/3ML) 0.083% nebulizer solution 2.5 mg (has no administration in time range)  magnesium sulfate IVPB 2 g 50 mL (has no administration in time range)  predniSONE (DELTASONE) tablet 50 mg (has no administration in time range)  insulin glargine (LANTUS) injection 35 Units (has no administration in time range)  ipratropium-albuterol (DUONEB) 0.5-2.5 (3) MG/3ML nebulizer solution (  Not Given 11/13/17 1456)  methylPREDNISolone sodium succinate (SOLU-MEDROL) 125 mg/2 mL injection 125 mg (125 mg Intravenous Given 11/13/17 1126)  albuterol (PROVENTIL,VENTOLIN) solution continuous neb (10 mg/hr Nebulization Given 11/13/17 1032)  LORazepam (ATIVAN) injection 0.5 mg (0.5 mg Intravenous Given 11/13/17 1153)  magnesium sulfate IVPB 2 g 50 mL ( Intravenous Stopped 11/13/17 1324)     Initial Impression / Assessment and Plan / ED Course  I have reviewed the triage vital signs and the nursing notes.  Pertinent labs & imaging results that were available during my care of the patient were reviewed by me and considered in my medical decision making (see chart for details).     47 year old female with a history of diabetes mellitus,  suicide attempt by drug ingestion, asthma, and HTN  presenting by EMS from her PCPs office and respiratory distress.  On arrival, she is tachypnea, tachycardic, and grasping at her throat stating that she feels as if she cannot breathe.  Nasal cannula is in place, which was changed to BiPAP with improvement of retractions and accessory muscle use.  SaO2 98 to 100% on room air.  She was given 2 albuterol treatments and ipratropium x1 prior to arrival so she was started on a continuous nebulizer treatment.  2g of magnesium sulfate and 125 mg of Solu-Medrol given in the ED. pH with 7.235 on VBG.  Patient was changed from nonrebreather to BiPAP.  Labs are otherwise improved from previous.  Doubt PE, CHF, or ACS.  Consulted the family medicine residency team and spoke with Dr. Maudie Mercury who will admit. The patient appears reasonably stabilized for admission considering the current resources, flow, and capabilities available in the ED at this time, and I doubt any other [facility] requiring further screening and/or treatment in the ED prior to admission.  Final Clinical Impressions(s) / ED Diagnoses   Final diagnoses:  Severe persistent asthma with acute exacerbation    ED Discharge Orders    None       Joanne Gavel, PA-C 11/13/17 1655    Quintella Reichert, MD 11/14/17 [phone]

## 2017-11-13 NOTE — Care Management Note (Signed)
Case Management Note  Patient Details  Name: [name] MRN: [id number] Date of Birth: 17-Aug-1970  Subjective/Objective:    From home, per consult for NCM, patient cant afford brio and albuterol inhalers.  NCM contacted pharmacist on the floor  To see if there was something more affordable that patient can get.  Pharmacist states the outpatient pharmacist with family practice may be able to assist patient with the inhalers.  NCM informed resident of this information.  Resident to contact outpatient family practice pharmacist [facility].                Action/Plan: NCM will follow for transition of care needs.  Expected Discharge Date:                  Expected Discharge Plan:  Home/Self Care  In-House Referral:     Discharge planning Services  CM Consult  Post Acute Care Choice:    Choice offered to:     DME Arranged:    DME Agency:     HH Arranged:    HH Agency:     Status of Service:  In process, will continue to follow  If discussed at Long Length of Stay Meetings, dates discussed:    Additional Comments:  Zenon Mayo, RN 11/13/2017, 3:42 PM

## 2017-11-14 DIAGNOSIS — J4551 Severe persistent asthma with (acute) exacerbation: Principal | ICD-10-CM

## 2017-11-14 LAB — CBC
HEMATOCRIT: 34 % — AB (ref 36.0–46.0)
Hemoglobin: 10.1 g/dL — ABNORMAL LOW (ref 12.0–15.0)
MCH: 25.2 pg — ABNORMAL LOW (ref 26.0–34.0)
MCHC: 29.7 g/dL — AB (ref 30.0–36.0)
MCV: 84.8 fL (ref 78.0–100.0)
PLATELETS: 422 10*3/uL — AB (ref 150–400)
RBC: 4.01 MIL/uL (ref 3.87–5.11)
RDW: 15.9 % — AB (ref 11.5–15.5)
WBC: 9.8 10*3/uL (ref 4.0–10.5)

## 2017-11-14 LAB — BASIC METABOLIC PANEL
Anion gap: 10 (ref 5–15)
BUN: 18 mg/dL (ref 6–20)
CALCIUM: 8.8 mg/dL — AB (ref 8.9–10.3)
CO2: 22 mmol/L (ref 22–32)
Chloride: 103 mmol/L (ref 98–111)
Creatinine, Ser: 1.39 mg/dL — ABNORMAL HIGH (ref 0.44–1.00)
GFR calc Af Amer: 51 mL/min — ABNORMAL LOW (ref >60)
GFR, EST NON AFRICAN AMERICAN: 44 mL/min — AB (ref >60)
GLUCOSE: 373 mg/dL — AB (ref 70–99)
POTASSIUM: 5 mmol/L (ref 3.5–5.1)
SODIUM: 135 mmol/L (ref 135–145)

## 2017-11-14 LAB — GLUCOSE, CAPILLARY
GLUCOSE-CAPILLARY: 310 mg/dL — AB (ref 70–99)
Glucose-Capillary: 275 mg/dL — ABNORMAL HIGH (ref 70–99)
Glucose-Capillary: 389 mg/dL — ABNORMAL HIGH (ref 70–99)

## 2017-11-14 LAB — MAGNESIUM: MAGNESIUM: 2.1 mg/dL (ref 1.7–2.4)

## 2017-11-14 LAB — HEMOGLOBIN A1C
HEMOGLOBIN A1C: 10.1 % — AB (ref 4.8–5.6)
MEAN PLASMA GLUCOSE: 243.17 mg/dL

## 2017-11-14 MED ORDER — INSULIN GLARGINE 100 UNIT/ML ~~LOC~~ SOLN
10.0000 [IU] | Freq: Once | SUBCUTANEOUS | Status: AC
  Administered 2017-11-14: 10 [IU] via SUBCUTANEOUS
  Filled 2017-11-14: qty 0.1

## 2017-11-14 MED ORDER — INFLUENZA VAC SPLIT QUAD 0.5 ML IM SUSY
0.5000 mL | PREFILLED_SYRINGE | INTRAMUSCULAR | Status: AC
  Administered 2017-11-14: 0.5 mL via INTRAMUSCULAR

## 2017-11-14 MED ORDER — IPRATROPIUM-ALBUTEROL 0.5-2.5 (3) MG/3ML IN SOLN
3.0000 mL | Freq: Three times a day (TID) | RESPIRATORY_TRACT | Status: DC
  Administered 2017-11-14 (×2): 3 mL via RESPIRATORY_TRACT
  Filled 2017-11-14 (×2): qty 3

## 2017-11-14 MED ORDER — PREDNISONE 50 MG PO TABS: 50.0000 mg | ORAL_TABLET | Freq: Every day | ORAL | 0 refills | Status: DC

## 2017-11-14 MED ORDER — LIVING WELL WITH DIABETES BOOK
Freq: Once | Status: AC
  Administered 2017-11-14: 17:00:00
  Filled 2017-11-14: qty 1

## 2017-11-14 MED ORDER — INSULIN GLARGINE 100 UNIT/ML ~~LOC~~ SOLN
50.0000 [IU] | Freq: Every day | SUBCUTANEOUS | Status: DC
  Filled 2017-11-14: qty 0.5

## 2017-11-14 MED ORDER — IPRATROPIUM-ALBUTEROL 0.5-2.5 (3) MG/3ML IN SOLN: 3.0000 mL | Freq: Three times a day (TID) | RESPIRATORY_TRACT | 3 refills | Status: DC | PRN

## 2017-11-14 MED ORDER — INSULIN ASPART 100 UNIT/ML ~~LOC~~ SOLN
10.0000 [IU] | Freq: Three times a day (TID) | SUBCUTANEOUS | Status: DC
  Administered 2017-11-14: 10 [IU] via SUBCUTANEOUS

## 2017-11-14 MED ORDER — FLUTICASONE PROPIONATE 50 MCG/ACT NA SUSP
2.0000 | Freq: Every day | NASAL | Status: DC
  Filled 2017-11-14: qty 16

## 2017-11-14 MED ORDER — INSULIN GLARGINE 100 UNIT/ML SOLOSTAR PEN: PEN_INJECTOR | SUBCUTANEOUS | 3 refills | Status: DC

## 2017-11-14 MED ORDER — ALBUTEROL SULFATE HFA 108 (90 BASE) MCG/ACT IN AERS: 2.0000 | INHALATION_SPRAY | Freq: Four times a day (QID) | RESPIRATORY_TRACT | 3 refills | Status: DC | PRN

## 2017-11-14 NOTE — Progress Notes (Signed)
Family Medicine Teaching Service Daily Progress Note Intern Pager: [phone]  Patient name: [name] Medical record number: [id number] Date of birth: May 28, 1970 Age: 47 y.o. Gender: female  Primary Care Provider: Nuala Alpha, DO Consultants: none Code Status: Partial   Pt Overview and Major Events to Date:   47 year old woman with PMH asthma, uncontrolled diabetes, hypertension, CKD 3, HLD, hypothyroidism, who presents with asthma exacerbation 2/2 missed medication home breo. She was given continuous albuterol, IV mag and solumedrol in the ED and placed on BIPAP but has since transitioned off of bipap to 2 L [place]. We are treating with prednisone 50 mg daily, scheduled nebs, and home breo. She is not on oxygen at home.   Assessment and Plan:  Asthma exacerbation  - feels improved and at baseline today, feels ready for discharge but does not want to go home with oxygen if it can be avoided so we will challenge this new requirement today  - continue breo 1 puff daily, duoneb q6h while awake, prednisone 50 mg daily with plans for 10 day course (9/7-9/16) - continue home flonase spray  - will attempt to wean resting oxygen requirement today  - will ambulate today and check for ambulatory oxygen requirement  - she will need a Breo inhaler prior to discharge, we will attempt to arrange for this through [facility] staff with meds to beds  Uncontrolled DM  - A1c 10.4  - home meds include lantus 50 units daily, metformin 1000 mg daily, and semaglutide 0.25 mg weekly  - currently on lantus 35 units daily, resistant sliding scale  -glucose 200-300 over the past 24 hours  - increase lantus to 50 units daily, will give an additional 10 units now  - start novolog 10 units TID WC  - continue resistant sliding scale with evening overage  - will need close follow up in the [facility] clinic for management in the setting of prednisone rx and uncontrolled DM  - provided requested educational material on  diabetes   Hypertension  - blood pressure controlled  -Continue home meds metoprolol succinate 25 mg daily, lisinopril 5 mg daily, and clonidine 0.1 mg daily   Hypothyroidism  - TSH 3.2 - continue home synthroid 300 mcg daily   Anxiety  - continue home buspar 10 mg TID, fluoxetine 80 mg daily, and mirtazapine 10 mg daily   HLD  - hemoglobin stable  - continue home atorvastatin   Iron deficiency anemia  - Hemoglobin stable  - continue home ferrous sulfate 325 mg daily   FEN/GI: regular diet  PPx: lovenox  Disposition: Challenging new oxygen requirement today. Will need Breo inhaler provided at discharge.   Subjective:  Feeling well. Breathing feels at baseline, no shortness of breath or chest pain overnight. Has not yet attempted to ambulate, will try that today.   Objective: Temp:  [97.9 F (36.6 C)-98.8 F (37.1 C)] 98.4 F (36.9 C) (09/07 1150) Pulse Rate:  [88-105] 88 (09/07 0450) Resp:  [12-28] 19 (09/07 1150) BP: (114-149)/(53-96) 134/87 (09/07 1150) SpO2:  [95 %-100 %] 98 % (09/07 0734) FiO2 (%):  [30 %-35 %] 30 % (09/06 1615) Weight:  [188.6 kg] 188.6 kg (09/06 1435) Physical Exam: General: well appearing, no acute distress, sitting up in bed eating lunch  Cardiovascular: regular rate and rhythm, no murmur, no peripheral edema  Respiratory: normal work of breathing, not wearing supplemental oxygen, intermittently end expiratory wheeze can be appreciated, there is good air movement heard throughout  Abdomen:  soft, non tender, non distended Extremities: warm, no visible rashes   Laboratory: Recent Labs  Lab 11/13/17 1125 11/14/17 0222  WBC 8.3 9.8  HGB 10.7* 10.1*  HCT 35.6* 34.0*  PLT 381 422*   Recent Labs  Lab 11/13/17 1125 11/14/17 0222  NA 141 135  K 3.9 5.0  CL 105 103  CO2 26 22  BUN 15 18  CREATININE 1.34* 1.39*  CALCIUM 9.0 8.8*  GLUCOSE 177* 373*   Imaging/Diagnostic Tests: CXR - no acute change  Ledell Noss, MD 11/14/2017, 12:12  PM PGY-3, [place] internal Medicine FPTS Intern pager: [phone], text pages welcome

## 2017-11-14 NOTE — Discharge Summary (Signed)
[facility] Discharge Summary  Patient name: [name] Medical record number: [id number] Date of birth: 1970-04-29 Age: 46 y.o. Gender: female Date of Admission: 11/13/2017  Date of Discharge: 11/14/2017 Admitting Physician: Martinique Shirley, DO  Primary Care Provider: Nuala Alpha, DO Consultants: none   Indication for Hospitalization: asthma exacerbation requiring Bipap for work of breathing   Discharge Diagnoses/Problem List:  Acute asthma exacerbation  Uncontrolled Diabetes Mellitus   Disposition: Home with Rx for prednisone to complete 10 day total course, sample of advair HFA ( as a replacement for Breo until she is able to afford a refill for this) and new rx for albuterol, duoneb and lantus sent to her pharmacy. Blood glucose became uncontrolled after starting the prednisone, she will closely monitor the blood glucose at home with meter and will be able to obtain a prescription for lantus from the pharmacy for free because of a card that she was provided during a past interaction.Patient was discharged over the weekend and will call for follow up first thing Monday morning.   Discharge Condition: Good   Discharge Exam: normal work of breathing, not requiring oxygen at rest or with ambulation, intermittent end expiratory wheezes  Brief Hospital Course:  47 year old woman with PMH asthma, uncontrolled diabetes, hypertension, CKD 3, HLD, hypothyroidism, who presents with asthma exacerbation 2/2 missed medication home breo. She initially presented to the family medicine clinic with shortness of breath where she was noted to have audible wheezing and tachypnea. She described having run out of her albuterol and breo inhalers for over one week due to cost. She was transferred to the [facility] and given continuous albuterol, IV mag and solumedrol in the ED and placed on BIPAP. Overnight on the evening of admission she was transitioned to nasal canula. The  morning after admission she described complete resolution of her symptoms. She ambulated and was found to not require supplemental oxygen. She was started on a course of prednisone 50 mg daily with plan to complete 10 day course. She was provided with a sample of advair HFA. She felt that she could afford needed refills of albuterol inhaler and duoneb solution so refills of these were sent to her pharmacy. Her glucose was elevated when she started the prednisone because we started a reduced dose of her home lantus at the time of admission in attempt to avoid hypoglycemia. She was counseled on close monitoring of the blood glucose and will schedule close follow up for eval of this. Hypertension remained well controlled off of clonidine so this was held at the time of discharge.   Medication Samples have been provided to the patient.  Drug name: Advair HFA   Strength: 230/21 Qty: 1 inhaler   LOT: [id number] Exp.Date: 02-06-2018  Dosing instructions: take one inhalation daily   Issues for Follow Up:  Uncontrolled DM - check meter and adjust medications if needed  Acute asthma exacerbation - confirm that she was able to obtain prednisone and new albuterol inhaler and duo nebs   Significant Procedures: none  Significant Labs and Imaging:  Recent Labs  Lab 11/13/17 1125 11/14/17 0222  WBC 8.3 9.8  HGB 10.7* 10.1*  HCT 35.6* 34.0*  PLT 381 422*   Recent Labs  Lab 11/13/17 1125 11/14/17 0222 11/14/17 1447  NA 141 135  --   K 3.9 5.0  --   CL 105 103  --   CO2 26 22  --   GLUCOSE 177* 373*  --  BUN 15 18  --   CREATININE 1.34* 1.39*  --   CALCIUM 9.0 8.8*  --   MG 1.3*  --  2.1    Hemoglobin A1c = 10.1  Admission Magnesium 1.3   Results/Tests Pending at Time of Discharge: none  Discharge Medications:  Allergies as of 11/14/2017      Reactions   Ace Inhibitors Other (See Comments)   Patient had acute renal failure requiring hemodialysis after a suicide attempt.  Nephro recommended  avoiding use.   Haldol [haloperidol Lactate] Other (See Comments)   Tardive dyskinesia   Nsaids Other (See Comments)   Nephro recommended avoiding after acute renal failure requiring hemodialysis associated with a suicide attempt.   Latex Other (See Comments)      Medication List    STOP taking these medications   azithromycin 250 MG tablet Commonly known as:  ZITHROMAX   cloNIDine 0.1 MG tablet Commonly known as:  CATAPRES   pantoprazole 40 MG tablet Commonly known as:  PROTONIX     TAKE these medications   albuterol 108 (90 Base) MCG/ACT inhaler Commonly known as:  PROVENTIL HFA;VENTOLIN HFA Inhale 2 puffs into the lungs every 6 (six) hours as needed for wheezing or shortness of breath.   aspirin EC 81 MG tablet Take 81 mg by mouth daily.   atorvastatin 40 MG tablet Commonly known as:  LIPITOR Take 1 tablet (40 mg total) by mouth daily.   busPIRone 10 MG tablet Commonly known as:  BUSPAR Take 10 mg by mouth 3 (three) times daily.   cariprazine capsule Commonly known as:  VRAYLAR Take 3 mg by mouth daily.   docusate sodium 100 MG capsule Commonly known as:  COLACE Take 200 mg by mouth daily as needed.   famotidine 20 MG tablet Commonly known as:  PEPCID One at bedtime What changed:    how much to take  how to take this  when to take this   ferrous sulfate 325 (65 FE) MG tablet Take 1 tablet (325 mg total) by mouth 3 (three) times daily with meals. What changed:    when to take this  additional instructions   FLUoxetine 40 MG capsule Commonly known as:  PROZAC Take 80 mg by mouth daily.   fluticasone 50 MCG/ACT nasal spray Commonly known as:  FLONASE Place 2 sprays into both nostrils daily. What changed:    when to take this  reasons to take this   fluticasone furoate-vilanterol 100-25 MCG/INH Aepb Commonly known as:  BREO ELLIPTA Inhale 1 puff into the lungs daily.   Insulin Glargine 100 UNIT/ML Solostar Pen Commonly known as:   LANTUS INJECT 50 UNITS SUBCUTANEOUSLY ONCE DAILY AT  10  PM What changed:    how much to take  how to take this  when to take this  additional instructions   ipratropium-albuterol 0.5-2.5 (3) MG/3ML Soln Commonly known as:  DUONEB Take 3 mLs by nebulization 3 (three) times daily as needed.   levothyroxine 300 MCG tablet Commonly known as:  SYNTHROID, LEVOTHROID Take 1 tablet (300 mcg total) by mouth daily.   lisinopril 5 MG tablet Commonly known as:  PRINIVIL,ZESTRIL Take 1 tablet (5 mg total) by mouth daily.   LORazepam 1 MG tablet Commonly known as:  ATIVAN Take 1 tablet (1 mg total) by mouth every 8 (eight) hours as needed for anxiety. Do not fill <30 days from last refill   metFORMIN 500 MG tablet Commonly known as:  GLUCOPHAGE Take 2 tablets (  1,000 mg total) by mouth daily with breakfast.   metoprolol succinate 25 MG 24 hr tablet Commonly known as:  TOPROL-XL Take 1 tablet (25 mg total) by mouth daily.   mirtazapine 15 MG tablet Commonly known as:  REMERON Take 15 mg by mouth daily.   predniSONE 50 MG tablet Commonly known as:  DELTASONE Take 1 tablet (50 mg total) by mouth daily with breakfast. Stop taking 11/22/2017 Start taking on:  11/15/2017   Semaglutide 0.25 or 0.5 MG/DOSE Sopn Inject 0.25 mg into the skin once a week.   Vitamin D (Ergocalciferol) 50000 units Caps capsule Commonly known as:  DRISDOL Take 1 capsule (50,000 Units total) by mouth every 7 (seven) days. Sunday       Discharge Instructions: Please refer to Patient Instructions section of EMR for full details.  Patient was counseled important signs and symptoms that should prompt return to medical care, changes in medications, dietary instructions, activity restrictions, and follow up appointments.   Follow-Up Appointments: Follow-up Information    [place]. Schedule an appointment as soon as possible for a visit in 3 day(s).   Contact information: [place]          Ledell Noss, MD 11/14/2017, 4:43 PM PGY-3, [facility]

## 2017-11-25 ENCOUNTER — Other Ambulatory Visit: Payer: Self-pay | Admitting: Family Medicine

## 2017-11-25 DIAGNOSIS — F411 Generalized anxiety disorder: Secondary | ICD-10-CM

## 2017-11-26 ENCOUNTER — Telehealth: Payer: Self-pay

## 2017-11-26 NOTE — Telephone Encounter (Signed)
Continued from previous message. Please let pt know if a Rx is being sent to pharmacy. Call her on [phone] Ottis Stain, CMA

## 2017-11-26 NOTE — Telephone Encounter (Signed)
Pt called to let us know that she couldn't get an appt with Dr. Garlan Fillers until 10/29. Pt would like to know if she can get enough adivan to get her through until she is seen.

## 2017-11-27 ENCOUNTER — Other Ambulatory Visit: Payer: Self-pay | Admitting: Family Medicine

## 2017-11-27 DIAGNOSIS — F411 Generalized anxiety disorder: Secondary | ICD-10-CM

## 2017-11-27 MED ORDER — LORAZEPAM 1 MG PO TABS: 1.0000 mg | ORAL_TABLET | Freq: Three times a day (TID) | ORAL | 0 refills | Status: DC | PRN

## 2017-11-27 NOTE — Progress Notes (Signed)
Refilling Ativan for patient as a one time event since she did schedule an appointment but the earliest I had available was 10/29.

## 2017-11-27 NOTE — Telephone Encounter (Signed)
LMOVM informing pt. [name], CMA  

## 2017-11-27 NOTE — Progress Notes (Signed)
Repeating order for Ativan as I mistakenly printed prescription instead of sending to the pharmacy.

## 2017-12-23 ENCOUNTER — Ambulatory Visit (INDEPENDENT_AMBULATORY_CARE_PROVIDER_SITE_OTHER): Payer: BC Managed Care – PPO | Admitting: Family Medicine

## 2017-12-23 ENCOUNTER — Other Ambulatory Visit: Payer: Self-pay

## 2017-12-23 VITALS — BP 142/80 | HR 92 | Temp 98.1°F | Wt >= 6400 oz

## 2017-12-23 DIAGNOSIS — M62838 Other muscle spasm: Secondary | ICD-10-CM

## 2017-12-23 MED ORDER — CYCLOBENZAPRINE HCL 10 MG PO TABS: 10.0000 mg | ORAL_TABLET | Freq: Three times a day (TID) | ORAL | 0 refills | Status: AC | PRN
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
## 2017-12-23 NOTE — Patient Instructions (Signed)
  Nice to see you- sorry you're in so much discomfort.  Please continue aleve as you have been and use heating pad - with caution!  You can take the muscle relaxer over the next 2-3 days as needed.  Please follow up with your PCP as scheduled to discuss blood pressure and other chronic issues.  If you have questions or concerns please do not hesitate to call at [phone].  Lucila Maine, DO PGY-3, [facility] 12/23/2017 10:24 AM

## 2017-12-25 ENCOUNTER — Telehealth: Payer: Self-pay

## 2017-12-25 NOTE — Telephone Encounter (Signed)
LM for patient letting her know that letter is ready for pick up. Jazmin Hartsell,CMA

## 2017-12-25 NOTE — Telephone Encounter (Signed)
Patient left message that she was also unable to go to work today due to muscle spasms. Wants to know if a new note can be given her to be out of work through today.  Call back is [phone]  Danley Danker, RN [facility] RN)

## 2017-12-25 NOTE — Telephone Encounter (Signed)
Sure, blue team can we draft this note for patient? Thank you

## 2018-01-05 ENCOUNTER — Other Ambulatory Visit: Payer: Self-pay

## 2018-01-05 ENCOUNTER — Encounter: Payer: Self-pay | Admitting: Family Medicine

## 2018-01-05 ENCOUNTER — Ambulatory Visit (INDEPENDENT_AMBULATORY_CARE_PROVIDER_SITE_OTHER): Payer: BC Managed Care – PPO | Admitting: Family Medicine

## 2018-01-05 VITALS — BP 148/82 | HR 104 | Temp 98.4°F | Ht 71.0 in | Wt >= 6400 oz

## 2018-01-05 DIAGNOSIS — F411 Generalized anxiety disorder: Secondary | ICD-10-CM | POA: Diagnosis not present

## 2018-01-05 DIAGNOSIS — J4541 Moderate persistent asthma with (acute) exacerbation: Secondary | ICD-10-CM

## 2018-01-05 DIAGNOSIS — E038 Other specified hypothyroidism: Secondary | ICD-10-CM | POA: Diagnosis not present

## 2018-01-05 MED ORDER — PREDNISONE 50 MG PO TABS: ORAL_TABLET | ORAL | 0 refills | Status: DC

## 2018-01-05 MED ORDER — LEVOTHYROXINE SODIUM 300 MCG PO TABS: 300.0000 ug | ORAL_TABLET | Freq: Every day | ORAL | 3 refills | Status: DC

## 2018-01-05 MED ORDER — FLUTICASONE FUROATE-VILANTEROL 100-25 MCG/INH IN AEPB: 1.0000 | INHALATION_SPRAY | Freq: Every day | RESPIRATORY_TRACT | 0 refills | Status: DC

## 2018-01-05 MED ORDER — FLUTICASONE PROPIONATE 50 MCG/ACT NA SUSP: 2.0000 | Freq: Every day | NASAL | 2 refills | Status: DC

## 2018-01-05 MED ORDER — ALBUTEROL SULFATE HFA 108 (90 BASE) MCG/ACT IN AERS: 2.0000 | INHALATION_SPRAY | Freq: Four times a day (QID) | RESPIRATORY_TRACT | 3 refills | Status: DC | PRN

## 2018-01-05 MED ORDER — FLUTICASONE FUROATE-VILANTEROL 200-25 MCG/INH IN AEPB: 1.0000 | INHALATION_SPRAY | Freq: Every day | RESPIRATORY_TRACT | 5 refills | Status: DC

## 2018-01-05 MED ORDER — METFORMIN HCL 500 MG PO TABS: 1000.0000 mg | ORAL_TABLET | Freq: Every day | ORAL | 2 refills | Status: DC

## 2018-01-05 MED ORDER — METOPROLOL SUCCINATE ER 25 MG PO TB24: 25.0000 mg | ORAL_TABLET | Freq: Every day | ORAL | 0 refills | Status: DC

## 2018-01-05 MED ORDER — LORAZEPAM 1 MG PO TABS: 1.0000 mg | ORAL_TABLET | Freq: Three times a day (TID) | ORAL | 0 refills | Status: DC | PRN

## 2018-01-05 NOTE — Patient Instructions (Signed)
It was great to see you today! Thank you for letting me participate in your care!  Today, we discussed your acute asthma exacerbation. I am sending in a refill of Breo today. Please continue to use Albuterol as needed and Duonebs at night as needed. I am sending in a prescription of Breo to your pharmacy. If it is too expensive, please call me and let me know. I will then choose another medication. I have also sent in a course of Prednisone to help. Please take it as directed.  Please schedule an appointment to have PFTs done in 2 weeks and also come and see me in 1 month. At that time we will address your other health concerns like Anxiety, Diabetes, and high blood pressure.  Be well, Harolyn Rutherford, DO PGY-2, [facility]
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
## 2018-01-12 NOTE — Assessment & Plan Note (Signed)
Patient is in acute exacerbation (given diffuse wheezing on exam) and poorly controlled as she is not able to afford her current medications. I have given her another Breo sample and increased the dose to 200/25mg . I have also refilled her Albuterol and given her a 5 day course of Prednisone 50mg  to help control her exacerbation and keep her from being admitted to the hospital.  I am also concerned a component of OSA with hypoventolation obesity syndrome may be playing a role. - Patient needs PFTs to help establish if her asthma is under good control on current medications. - Follow up in one month to solidify plan on medications she can take to help control her asthma and not rely on samples.

## 2018-01-12 NOTE — Assessment & Plan Note (Signed)
>>  ASSESSMENT AND PLAN FOR ANXIETY WRITTEN ON 01/12/2018 10:48 AM BY LOCKAMY, TIMOTHY, DO  Patient has poorly controlled anxiety and is not willing to wean today. I had already discussed a plan to get her off these medications but she is not willing today. I am concerned that she has become dependent on Ativan. - Refill today to current dose.  - Discussed with patient that 90 per month is too high and we will have to begin to wean off these medications. She was no enthusiastic about this however she did understand my reasons and the dangers of using this medication chronically. - At next appointment Ativan will be weaned by 10% - a decrease in 9 pills per month. - Patient will keep appointment with [facility] for further medication changes. Given her anxiety is extremely poorly controlled I will value their input on medication changes given she is on several anti-anxiety medications.

## 2018-01-12 NOTE — Assessment & Plan Note (Signed)
Patient has poorly controlled anxiety and is not willing to wean today. I had already discussed a plan to get her off these medications but she is not willing today. I am concerned that she has become dependent on Ativan. - Refill today to current dose.  - Discussed with patient that 90 per month is too high and we will have to begin to wean off these medications. She was no enthusiastic about this however she did understand my reasons and the dangers of using this medication chronically. - At next appointment Ativan will be weaned by 10% - a decrease in 9 pills per month. - Patient will keep appointment with [facility] for further medication changes. Given her anxiety is extremely poorly controlled I will value their input on medication changes given she is on several anti-anxiety medications.

## 2018-01-12 NOTE — Assessment & Plan Note (Signed)
>>  ASSESSMENT AND PLAN FOR ASTHMA EXACERBATION WRITTEN ON 01/12/2018 10:43 AM BY LOCKAMY, TIMOTHY, DO  Patient is in acute exacerbation (given diffuse wheezing on exam) and poorly controlled as she is not able to afford her current medications. I have given her another Breo sample and increased the dose to 200/25mg . I have also refilled her Albuterol and given her a 5 day course of Prednisone 50mg  to help control her exacerbation and keep her from being admitted to the hospital.  I am also concerned a component of OSA with hypoventolation obesity syndrome may be playing a role. - Patient needs PFTs to help establish if her asthma is under good control on current medications. - Follow up in one month to solidify plan on medications she can take to help control her asthma and not rely on samples.

## 2018-01-12 NOTE — Assessment & Plan Note (Signed)
Continue synthroid 357mcg. Last TSH on 11/13/17 was wnl.

## 2018-03-30 ENCOUNTER — Other Ambulatory Visit: Payer: Self-pay

## 2018-03-30 ENCOUNTER — Encounter: Payer: Self-pay | Admitting: Family Medicine

## 2018-03-30 ENCOUNTER — Ambulatory Visit (INDEPENDENT_AMBULATORY_CARE_PROVIDER_SITE_OTHER): Payer: BC Managed Care – PPO | Admitting: Family Medicine

## 2018-03-30 VITALS — BP 138/80 | HR 93 | Temp 98.2°F | Ht 71.0 in | Wt 397.6 lb

## 2018-03-30 DIAGNOSIS — F411 Generalized anxiety disorder: Secondary | ICD-10-CM

## 2018-03-30 DIAGNOSIS — J4541 Moderate persistent asthma with (acute) exacerbation: Secondary | ICD-10-CM

## 2018-03-30 DIAGNOSIS — E118 Type 2 diabetes mellitus with unspecified complications: Secondary | ICD-10-CM | POA: Diagnosis not present

## 2018-03-30 LAB — POCT GLYCOSYLATED HEMOGLOBIN (HGB A1C): HBA1C, POC (CONTROLLED DIABETIC RANGE): 9.7 % — AB (ref 0.0–7.0)

## 2018-03-30 MED ORDER — BASAGLAR KWIKPEN 100 UNIT/ML ~~LOC~~ SOPN: 100.0000 [IU] | PEN_INJECTOR | Freq: Every day | SUBCUTANEOUS | 2 refills | Status: DC

## 2018-03-30 MED ORDER — LORAZEPAM 1 MG PO TABS: 1.0000 mg | ORAL_TABLET | Freq: Three times a day (TID) | ORAL | 0 refills | Status: DC | PRN

## 2018-03-30 NOTE — Patient Instructions (Addendum)
It was great to see you today! Thank you for letting me participate in your care!  Today, we discussed your diabetes and you have shown improvement! Congratulations on lowering your A1c!!  We still have a little ways to go but continue with the diet and increase your exercise and we will continue to see improvements until we reach our goal. I have sent Basaglar to the pharmacy.  For you hot flashes try OTC Estroven    I have refilled your Ativan and we will continue to wean down. I am glad you are feeling better!  Be well, Harolyn Rutherford, DO PGY-2, [facility]

## 2018-03-30 NOTE — Progress Notes (Signed)
Subjective: Chief Complaint  Patient presents with  . Medication Refill    HPI: [name] is a 48 y.o. presenting to clinic today to discuss the following:  T2DM Patient can no longer afford Lantus so is switching to [employer]. Has been taking it at night 50-70U and taking Metformin and Ozempic as prescribed. She has also been actively trying to lose weight by changing her diet and exercising. She has lost 18lbs since her last visit. Her A1c today is still above goal but did improve from last visit. Encouraged her that the changes she made is making a difference.  Anxiety Patient has been to [facility] and continues to appreciate and like her care from them. They are managing her antidepressant medications. We had a lengthy discussion about her continued use of Benzodiazepines and she is in agreement to wean them down at this time. She understands the risk of addiction and respiratory depression with these medications but still has difficulty with her anxiety. She does feel better as she was recently started on Remeron daily.   Asthma Continues to have difficulty affording medications and has run out of the Breo sample and could not afford any other medications except Albuterol inhaler and has nebs at home. Her asthma is very poorly controlled and she is having mild difficulty breathing today. She is not having any pleuritic chest pain and it does respond to Albuterol but she is having to use it every other day.  ROS noted in HPI.   Past Medical, Surgical, Social, and Family History Reviewed & Updated per EMR.   Pertinent Historical Findings include:   Social History   Tobacco Use  Smoking Status Never Smoker  Smokeless Tobacco Never Used    Objective: BP 138/80   Pulse 93   Temp 98.2 F (36.8 C) (Oral)   Ht 5\' 11"  (1.803 m)   Wt (!) 397 lb 9.6 oz (180.4 kg)   SpO2 98%   BMI 55.45 kg/m  Vitals and nursing notes reviewed  Physical Exam Gen: Alert and Oriented x 3,  NAD, obese anxious appearing female HEENT: Normocephalic, atraumatic Neck: trachea midline, no thyroidmegaly, no LAD CV: RRR, no murmurs, normal S1, S2 split Resp: mild wheezing in bibasilar lung fields, no rales or rhonchi, comfortable work of breathing Ext: no clubbing, cyanosis, or edema Skin: warm, dry, intact, no rashes  Results for orders placed or performed in visit on 03/30/18 (from the past 72 hour(s))  HgB A1c     Status: Abnormal   Collection Time: 03/30/18  4:15 PM  Result Value Ref Range   Hemoglobin A1C     HbA1c POC (<> result, manual entry)     HbA1c, POC (prediabetic range)     HbA1c, POC (controlled diabetic range) 9.7 (A) 0.0 - 7.0 %    Assessment/Plan:  Type II diabetes mellitus with complication (HCC) Still poorly controlled but improvement with diet change leading to weight reduction since last visit. A1c was 9.7 today down from 10.4 5 months ago.  - Cont with diet and add in exercise - Cont Basaglar 50U at night - Cont Metformin 1000mg  BID - Cont Ozempic 0.25 weekly - Appointment with Dr. Valentina Lucks for help with affording diabetic medications  Asthma exacerbation - Appointment with Dr. Valentina Lucks for PFTs; Ideally should be on LAMA+LABA or LAMA+ICS but has issues with affording medications - Cont Albuterol and nebs at home  Anxiety state - Decreasing Ativan from 60 pills to 55 pills. - Goal was  openly discussed to eventually stop these medications completely but we will continue to wean by about 10% each visit. - I am encouraged she is going to [name], using less Atvian on a daily basis, and feels overall that her anxiety is improving.   PATIENT EDUCATION PROVIDED: See AVS    Diagnosis and plan along with any newly prescribed medication(s) were discussed in detail with this patient today. The patient verbalized understanding and agreed with the plan. Patient advised if symptoms worsen return to clinic or ER.    Orders Placed This Encounter  Procedures  .  HgB A1c    Meds ordered this encounter  Medications  . DISCONTD: Insulin Glargine (BASAGLAR KWIKPEN) 100 UNIT/ML SOPN    Sig: Inject 1 mL (100 Units total) into the skin at bedtime.    Dispense:  3 mL    Refill:  2  . LORazepam (ATIVAN) 1 MG tablet    Sig: Take 1 tablet (1 mg total) by mouth every 8 (eight) hours as needed for anxiety. Do not fill <30 days from last refill    Dispense:  55 tablet    Refill:  0     Harolyn Rutherford, DO 03/30/2018, 4:15 PM PGY-2 [facility]

## 2018-03-31 ENCOUNTER — Other Ambulatory Visit: Payer: Self-pay | Admitting: Family Medicine

## 2018-03-31 ENCOUNTER — Telehealth: Payer: Self-pay

## 2018-03-31 MED ORDER — BASAGLAR KWIKPEN 100 UNIT/ML ~~LOC~~ SOPN: 50.0000 [IU] | PEN_INJECTOR | Freq: Every day | SUBCUTANEOUS | 2 refills | Status: DC

## 2018-03-31 NOTE — Progress Notes (Signed)
Mistakenly ordered basaglar for only a few days. Updating dosage and refill amounts to give a longer supply.

## 2018-03-31 NOTE — Telephone Encounter (Signed)
Kayla from [facility] called to check on dose for Basaglar. Rx is for 1 ML at bed time. One pen will be good for one day. So she will have enough for 3 days. A 30 day supply is 30 ML. Please call pharmacy to verify dose and amount. [phone]. Ottis Stain, CMA

## 2018-04-01 NOTE — Assessment & Plan Note (Signed)
>>  ASSESSMENT AND PLAN FOR ANXIETY WRITTEN ON 04/01/2018  5:02 PM BY LOCKAMY, TIMOTHY, DO  - Decreasing Ativan from 60 pills to 55 pills. - Goal was openly discussed to eventually stop these medications completely but we will continue to wean by about 10% each visit. - I am encouraged she is going to [place], using less Atvian on a daily basis, and feels overall that her anxiety is improving.

## 2018-04-01 NOTE — Assessment & Plan Note (Signed)
>>  ASSESSMENT AND PLAN FOR ASTHMA EXACERBATION WRITTEN ON 04/01/2018  5:01 PM BY LOCKAMY, TIMOTHY, DO  - Appointment with Dr. Raymondo Band for PFTs; Ideally should be on LAMA+LABA or LAMA+ICS but has issues with affording medications - Cont Albuterol and nebs at home

## 2018-04-01 NOTE — Assessment & Plan Note (Signed)
-   Decreasing Ativan from 60 pills to 55 pills. - Goal was openly discussed to eventually stop these medications completely but we will continue to wean by about 10% each visit. - I am encouraged she is going to [place], using less Atvian on a daily basis, and feels overall that her anxiety is improving.

## 2018-04-01 NOTE — Assessment & Plan Note (Signed)
Still poorly controlled but improvement with diet change leading to weight reduction since last visit. A1c was 9.7 today down from 10.4 5 months ago.  - Cont with diet and add in exercise - Cont Basaglar 50U at night - Cont Metformin 1000mg  BID - Cont Ozempic 0.25 weekly - Appointment with Dr. Valentina Lucks for help with affording diabetic medications

## 2018-04-01 NOTE — Assessment & Plan Note (Signed)
-   Appointment with Dr. Valentina Lucks for PFTs; Ideally should be on LAMA+LABA or LAMA+ICS but has issues with affording medications - Cont Albuterol and nebs at home

## 2018-04-02 ENCOUNTER — Telehealth: Payer: Self-pay | Admitting: *Deleted

## 2018-04-02 NOTE — Telephone Encounter (Signed)
lmovm informing patient. [name], [name], CMA

## 2018-04-02 NOTE — Telephone Encounter (Signed)
Patient states that she spoke with Dr. Garlan Fillers about her knee pain on Tuesday's appt.  She has not bee able to go to work this week due to the knee pain.   Unfortunately , we do not have any appts left.  Advised to go to urgent care.  She wants to know if the MD will write her out of work for this week. Will forward to MD.

## 2018-04-05 ENCOUNTER — Telehealth: Payer: Self-pay | Admitting: *Deleted

## 2018-04-05 NOTE — Telephone Encounter (Signed)
Pharmacy called to clarify basaglar instructions.  Advised that 50 units is the correct dose.  Will forward to MD, if that is not correct please advise what is correct dose and resend. Fleeger, Salome Spotted, CMA

## 2018-04-12 ENCOUNTER — Other Ambulatory Visit (HOSPITAL_COMMUNITY): Payer: Self-pay | Admitting: Family Medicine

## 2018-04-12 ENCOUNTER — Other Ambulatory Visit: Payer: Self-pay | Admitting: Family Medicine

## 2018-04-13 ENCOUNTER — Telehealth: Payer: Self-pay

## 2018-04-13 NOTE — Telephone Encounter (Signed)
Fax from pharmacy for Protonix refill, not on current med list.  Danley Danker, RN [facility] RN)

## 2018-04-15 ENCOUNTER — Other Ambulatory Visit: Payer: Self-pay | Admitting: Family Medicine

## 2018-04-15 MED ORDER — PANTOPRAZOLE SODIUM 40 MG PO TBEC: 40.0000 mg | DELAYED_RELEASE_TABLET | Freq: Every day | ORAL | 3 refills | Status: DC

## 2018-04-15 NOTE — Progress Notes (Signed)
Patient requesting protonix. Sending in order via epic

## 2018-04-17 ENCOUNTER — Other Ambulatory Visit: Payer: Self-pay

## 2018-04-17 ENCOUNTER — Emergency Department (HOSPITAL_COMMUNITY): Payer: BC Managed Care – PPO

## 2018-04-17 ENCOUNTER — Encounter (HOSPITAL_COMMUNITY): Payer: Self-pay | Admitting: *Deleted

## 2018-04-17 ENCOUNTER — Emergency Department (HOSPITAL_COMMUNITY)
Admission: EM | Admit: 2018-04-17 | Discharge: 2018-04-17 | Disposition: A | Discharge status: Home / Self Care | Payer: BC Managed Care – PPO | Attending: Emergency Medicine | Admitting: Emergency Medicine

## 2018-04-17 DIAGNOSIS — Z91040 Latex allergy status: Secondary | ICD-10-CM | POA: Insufficient documentation

## 2018-04-17 DIAGNOSIS — M1711 Unilateral primary osteoarthritis, right knee: Secondary | ICD-10-CM | POA: Insufficient documentation

## 2018-04-17 DIAGNOSIS — E1122 Type 2 diabetes mellitus with diabetic chronic kidney disease: Secondary | ICD-10-CM | POA: Insufficient documentation

## 2018-04-17 DIAGNOSIS — I10 Essential (primary) hypertension: Secondary | ICD-10-CM

## 2018-04-17 DIAGNOSIS — G8929 Other chronic pain: Secondary | ICD-10-CM

## 2018-04-17 DIAGNOSIS — Z7982 Long term (current) use of aspirin: Secondary | ICD-10-CM | POA: Diagnosis not present

## 2018-04-17 DIAGNOSIS — N183 Chronic kidney disease, stage 3 (moderate): Secondary | ICD-10-CM | POA: Insufficient documentation

## 2018-04-17 DIAGNOSIS — Z7984 Long term (current) use of oral hypoglycemic drugs: Secondary | ICD-10-CM | POA: Diagnosis not present

## 2018-04-17 DIAGNOSIS — I129 Hypertensive chronic kidney disease with stage 1 through stage 4 chronic kidney disease, or unspecified chronic kidney disease: Secondary | ICD-10-CM | POA: Insufficient documentation

## 2018-04-17 DIAGNOSIS — M25561 Pain in right knee: Secondary | ICD-10-CM | POA: Insufficient documentation

## 2018-04-17 DIAGNOSIS — R2241 Localized swelling, mass and lump, right lower limb: Secondary | ICD-10-CM | POA: Insufficient documentation

## 2018-04-17 DIAGNOSIS — Z79899 Other long term (current) drug therapy: Secondary | ICD-10-CM | POA: Diagnosis not present

## 2018-04-17 DIAGNOSIS — M25571 Pain in right ankle and joints of right foot: Secondary | ICD-10-CM | POA: Insufficient documentation

## 2018-04-17 DIAGNOSIS — E039 Hypothyroidism, unspecified: Secondary | ICD-10-CM | POA: Insufficient documentation

## 2018-04-17 MED ORDER — HYDROCODONE-ACETAMINOPHEN 5-325 MG PO TABS: 1.0000 | ORAL_TABLET | Freq: Four times a day (QID) | ORAL | 0 refills | Status: DC | PRN

## 2018-04-17 MED ORDER — MELOXICAM 15 MG PO TABS: 15.0000 mg | ORAL_TABLET | Freq: Every day | ORAL | 0 refills | Status: DC

## 2018-04-17 NOTE — Discharge Instructions (Addendum)
Use ace wrap as needed for comfort. Use ice or heat and elevate ankle and knee throughout the day, using ice/heat pack for no more than 20 minutes every hour.  Use mobic daily as directed, don't use additional NSAIDs like ibuprofen/aleve/etc while taking mobic. Use norco as directed as needed for additional pain relief. Do not drive or operate machinery with pain medication use. Try to lose weight as this will help your knee and ankle arthritis. Follow up with the orthopedist in 1-2 weeks for recheck of symptoms and ongoing management of your arthritis. Return to the ER for changes or worsening symptoms.   Your blood pressure was elevated today. Eat a low salt/low sodium diet, and take all of your home blood pressure medications as directed. Keep a log of your blood pressure readings from every morning and evening (making sure to give yourself at least 15 minutes of rest prior to checking it) and take it to your doctor's office at your next appointment for ongoing management of your blood pressure. Stay well hydrated and get plenty of rest. Avoid caffeine and other over the counter products that would make your blood pressure go up (such as decongestants, excedrin, etc). Follow up with your regular doctor in 1 week for recheck of symptoms and ongoing management of your blood pressure. Return to the ER for emergent changes or worsening symptoms.

## 2018-04-17 NOTE — ED Provider Notes (Addendum)
[facility] DEPT Provider Note   CSN: [id number] Arrival date & time: 04/17/18  [place]     History   Chief Complaint Chief Complaint  Patient presents with  . Knee Pain    right  . Ankle Pain    right    HPI [name] is a 48 y.o. female with a PMHx of HTN, DM2, asthma, and other conditions listed below, with PSHx of R ankle arthroscopy with synovectomy and loose body excision by Dr. Rhona Raider, who presents to the ED with complaints of 2 months of right ankle pain and 2 weeks of right knee pain.  Patient states that her ankle was bothering her for the last 2 months, but her right knee started hurting 2 weeks ago and is worse than her ankle, which is why she came in for evaluation.  She has had ankle issues before, previously under the care of Dr. Rhona Raider at [facility], but she never had issues with her knee until now. No injury or trauma.  She describes the pain as 9/10 constant throbbing right knee pain that radiates down the leg, worse with standing, and unrelieved with Tylenol, ibuprofen, BCs, heat, ice, and icy hot.  She reports associated swelling to the knee and ankle.  She denies any bruising, erythema, warmth, numbness, tingling, focal weakness, or any other complaints related to this at this time.  She has lost 20 pounds in the last few months but this hasn't helped her knee/ankle.    Of note, she is hypertensive, which she states is probably because she is in pain, she denies having any headache, vision changes, lightheadedness, chest pain, shortness of breath, or any other complaints related to her blood pressure.    Also, she previously had rhabdomyolysis which resulted in acute renal failure and was on dialysis for several weeks, but she states that her kidneys have since recovered and she has no restrictions as far as NSAIDs are concerned.  The history is provided by the patient and medical records. No language interpreter was used.    Knee Pain  Ankle Pain    Past Medical History:  Diagnosis Date  . Acute renal failure ([place]) 05/27/10   hemodialysis for 6 weeks  . Anxiety   . Asthma   . Depression   . Diabetes mellitus   . Hypertension   . Rhabdomyolysis 06/06/10   after drug overdose  . Suicide attempt by drug ingestion ([place]) 06/05/10   result rhabdomyolosis and ARF requrining dialysis     Patient Active Problem List   Diagnosis Date Noted  . Bipolar disorder ([place]) 04/29/2017  . HLD (hyperlipidemia) 03/19/2017  . Asthma exacerbation 03/14/2017  . Weight gain 12/06/2016  . 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 Essential hypertension   . Vitamin D deficiency 04/11/2015  . Fatigue 04/10/2015  . Non compliance w medication regimen 04/10/2015  . Thyroid activity decreased   . Adjustment disorder with mixed anxiety and depressed mood   . Cough 09/20/2014  . Grief at loss of child 06/22/2014  . Dysmenorrhea 11/25/2013  . Suicidal ideation 05/27/2011  . CKD (chronic kidney disease) stage 3, GFR 30-59 ml/min (HCC) 11/06/2010  . ENDOMETRIAL HYPERPLASIA UNSPECIFIED 02/07/2008  . MENORRHAGIA 01/03/2008  . Allergic rhinitis 06/24/2007  . Essential hypertension, benign 02/03/2007  . Hypothyroidism 05/07/2006  . Type II diabetes mellitus with complication ([place]) 76/22/6333  . Morbid obesity ([place]) 05/07/2006  . Iron deficiency anemia 05/07/2006  . Anxiety  state 05/07/2006  . OBSESSIVE COMPUL. DISORDER 05/07/2006    Past Surgical History:  Procedure Laterality Date  . ANKLE ARTHROSCOPY Right 08/17/2015   Procedure: RIGHT ANKLE ARTHROSCOPY WITH SYNOVECTOMY AND LOOSE BODY EXCISION;  Surgeon: Melrose Nakayama, MD;  Location: [place] [facility];  Service: Orthopedics;  Laterality: Right;  . CHOLECYSTECTOMY    . TUBAL LIGATION  1998     OB History   No obstetric history on file.      Home Medications    Prior to Admission medications   Medication Sig Start Date End Date  Taking? Authorizing Provider  albuterol (PROVENTIL HFA;VENTOLIN HFA) 108 (90 Base) MCG/ACT inhaler Inhale 2 puffs into the lungs every 6 (six) hours as needed for wheezing or shortness of breath. 01/05/18   Nuala Alpha, DO  aspirin EC 81 MG tablet Take 81 mg by mouth daily.    [provider]  atorvastatin (LIPITOR) 40 MG tablet Take 1 tablet (40 mg total) by mouth daily. Patient not taking: Reported on 11/14/2017 03/19/17   Verner Mould, MD  Cariprazine HCl 3 MG CAPS Take 3 mg by mouth daily.    [provider]  docusate sodium (COLACE) 100 MG capsule Take 200 mg by mouth daily as needed.     [provider]  famotidine (PEPCID) 20 MG tablet One at bedtime Patient taking differently: Take 20 mg by mouth at bedtime. One at bedtime 12/04/16   Verner Mould, MD  ferrous sulfate 325 (65 FE) MG tablet Take 1 tablet (325 mg total) by mouth 3 (three) times daily with meals. Patient taking differently: Take 325 mg by mouth daily. Take every other day with stool softer 12/04/16   Verner Mould, MD  FLUoxetine (PROZAC) 40 MG capsule Take 80 mg by mouth daily.     [provider]  fluticasone (FLONASE) 50 MCG/ACT nasal spray Place 2 sprays into both nostrils daily. 01/05/18   Lockamy, Timothy, DO  fluticasone furoate-vilanterol (BREO ELLIPTA) 200-25 MCG/INH AEPB Inhale 1 puff into the lungs daily. 01/05/18   Nuala Alpha, DO  Insulin Glargine (BASAGLAR KWIKPEN) 100 UNIT/ML SOPN Inject 0.5 mLs (50 Units total) into the skin at bedtime. 03/31/18   Lockamy, Christia Reading, DO  ipratropium-albuterol (DUONEB) 0.5-2.5 (3) MG/3ML SOLN Take 3 mLs by nebulization 3 (three) times daily as needed. 11/14/17   Ledell Noss, MD  levothyroxine (SYNTHROID, LEVOTHROID) 300 MCG tablet Take 1 tablet (300 mcg total) by mouth daily. 01/05/18   Lockamy, Christia Reading, DO  lisinopril (PRINIVIL,ZESTRIL) 5 MG tablet TAKE 1 TABLET BY MOUTH ONCE DAILY 04/12/18   Lockamy, Timothy,  DO  LORazepam (ATIVAN) 1 MG tablet Take 1 tablet (1 mg total) by mouth every 8 (eight) hours as needed for anxiety. Do not fill <30 days from last refill 03/30/18   Nuala Alpha, DO  metFORMIN (GLUCOPHAGE) 500 MG tablet Take 2 tablets (1,000 mg total) by mouth daily with breakfast. 01/05/18 04/05/18  Nuala Alpha, DO  metoprolol succinate (TOPROL-XL) 25 MG 24 hr tablet TAKE 1 TABLET BY MOUTH ONCE DAILY 04/12/18   Nuala Alpha, DO  mirtazapine (REMERON) 15 MG tablet Take 15 mg by mouth daily. 03/09/17   [provider]  pantoprazole (PROTONIX) 40 MG tablet Take 1 tablet (40 mg total) by mouth daily. 04/15/18   Nuala Alpha, DO  Semaglutide (OZEMPIC) 0.25 or 0.5 MG/DOSE SOPN Inject 0.25 mg into the skin once a week. 07/22/17   Verner Mould, MD  Vitamin D, Ergocalciferol, (DRISDOL) 50000 units CAPS capsule Take  1 capsule (50,000 Units total) by mouth every 7 (seven) days. Sunday 12/04/16   Verner Mould, MD    Family History Family History  Problem Relation Age of Onset  . Asthma Father     Social History Social History   Tobacco Use  . Smoking status: Never Smoker  . Smokeless tobacco: Never Used  Substance Use Topics  . Alcohol use: No  . Drug use: No     Allergies   Ace inhibitors; Haldol [haloperidol lactate]; Nsaids; and Latex   Review of Systems Review of Systems  Eyes: Negative for visual disturbance.  Respiratory: Negative for shortness of breath.   Cardiovascular: Negative for chest pain.  Musculoskeletal: Positive for arthralgias and joint swelling.  Skin: Negative for color change.  Allergic/Immunologic: Positive for immunocompromised state (DM2).  Neurological: Negative for weakness, light-headedness, numbness and headaches.     Physical Exam Updated Vital Signs BP (!) 170/109   Pulse 97   Temp 99.6 F (37.6 C) (Oral)   Resp 18   Ht 5\' 11"  (1.803 m)   Wt (!) 179.2 kg   SpO2 98%   BMI 55.09 kg/m    Physical  Exam Vitals signs and nursing note reviewed.  Constitutional:      General: She is not in acute distress.    Appearance: Normal appearance. She is well-developed. She is not toxic-appearing.     Comments: Afebrile, nontoxic, NAD, HTN noted similar to prior visits. Morbidly obese  HENT:     Head: Normocephalic and atraumatic.  Eyes:     General:        Right eye: No discharge.        Left eye: No discharge.     Conjunctiva/sclera: Conjunctivae normal.  Neck:     Musculoskeletal: Normal range of motion and neck supple.  Cardiovascular:     Rate and Rhythm: Normal rate.     Pulses: Normal pulses.  Pulmonary:     Effort: Pulmonary effort is normal. No respiratory distress.  Abdominal:     General: There is no distension.  Musculoskeletal:     Right knee: She exhibits normal range of motion, no swelling, no ecchymosis, no deformity, no erythema, normal alignment, no LCL laxity, normal patellar mobility and no MCL laxity. Tenderness found.     Right ankle: She exhibits normal range of motion, no swelling, no ecchymosis, no deformity and normal pulse. Tenderness. Achilles tendon normal.     Comments: R ankle with FROM intact, mild diffuse TTP across entire ankle, no bruising or swelling, no crepitus or deformity, no erythema or warmth.  R knee obese which limits evaluation, but without obvious swelling, FROM intact although painful, with moderate diffuse joint line TTP, no bruising or erythema, no warmth, no crepitus or deformity, no abnormal alignment or patellar mobility, no varus/valgus laxity. No tenderness to calf, no pedal edema.  Strength and sensation grossly intact, distal pulses intact, compartments soft.   Skin:    General: Skin is warm and dry.     Findings: No rash.  Neurological:     Mental Status: She is alert and oriented to person, place, and time.     Sensory: Sensation is intact. No sensory deficit.     Motor: Motor function is intact.  Psychiatric:        Mood and  Affect: Mood and affect normal.        Behavior: Behavior normal.      ED Treatments / Results  Labs (all labs ordered  are listed, but only abnormal results are displayed) Labs Reviewed - No data to display  EKG None  Radiology Dg Ankle Complete Right  Result Date: 04/17/2018 CLINICAL DATA:  Right ankle pain EXAM: RIGHT ANKLE - COMPLETE 3+ VIEW COMPARISON:  None. FINDINGS: Degenerative changes in the right ankle and hindfoot. Large spurs along the anterior tarsal bones. No fracture, subluxation or dislocation. IMPRESSION: Degenerative changes in the ankle and hindfoot. No acute bony abnormality. Electronically Signed   By: Rolm Baptise M.D.   On: 04/17/2018 20:24   Dg Knee Complete 4 Views Right  Result Date: 04/17/2018 CLINICAL DATA:  Knee pain EXAM: RIGHT KNEE - COMPLETE 4+ VIEW COMPARISON:  06/10/2010 FINDINGS: Tricompartment osteoarthritis, most pronounced in the patellofemoral compartment with joint space narrowing and spurring. Moderate joint effusion. No acute bony abnormality. Specifically, no fracture, subluxation, or dislocation. IMPRESSION: Tricompartment osteoarthritis. Moderate joint effusion. No acute bony abnormality. Electronically Signed   By: Rolm Baptise M.D.   On: 04/17/2018 20:25    Procedures Procedures (including critical care time)  Medications Ordered in ED Medications - No data to display   Initial Impression / Assessment and Plan / ED Course  I have reviewed the triage vital signs and the nursing notes.  Pertinent labs & imaging results that were available during my care of the patient were reviewed by me and considered in my medical decision making (see chart for details).     48 y.o. female here with 2 months of right ankle pain and then 2 weeks of right knee pain.  States that the knee hurts worse than the ankle.  Has had issues with her ankle in the past.  Never had issues with the knee.  On exam, morbidly obese, mildly hypertensive but similar to  prior visits.  Right ankle with mild diffuse tenderness, no swelling or bruising, no erythema or warmth, F ROM intact.  Right knee obese and therefore limits evaluation, but no obvious swelling, no erythema or warmth, no bruising, with diffuse joint line tenderness.  X-rays of both areas reveal degenerative arthritis, the knee has tricompartmental arthritis with associated joint effusion, joint space narrowing and spurring.  No other acute abnormalities.  Suspect that arthritis is the etiology of her symptoms.  Advised that weight loss is the first step in her treatment and that this would likely need to happen before any conversation of surgery would be done.  We will send home with Mobic, as well as short supply of narcotic pain medication.  Will apply ACE wrap for comfort, doubt need for crutches.  Advised ice/heat, and follow-up with orthopedist.  Doubt gout or septic arthritis, doubt need for further emergent work-up.    Regarding her hypertension, patient asymptomatic, doubt need for further emergent work-up of this, advised keeping log of BPs, taking home meds, and avoiding things that raise BP. F/up with PCP for management of this. I explained the diagnosis and have given explicit precautions to return to the ER including for any other new or worsening symptoms. The patient understands and accepts the medical plan as it's been dictated and I have answered their questions. Discharge instructions concerning home care and prescriptions have been given. The patient is STABLE and is discharged to home in good condition.    Final Clinical Impressions(s) / ED Diagnoses   Final diagnoses:  Acute pain of right knee  Osteoarthritis of right knee, unspecified osteoarthritis type  Chronic pain of right ankle  Essential hypertension    ED Discharge Orders  Ordered    HYDROcodone-acetaminophen (NORCO) 5-325 MG tablet  Every 6 hours PRN     04/17/18 2058    meloxicam (MOBIC) 15 MG tablet  Daily       04/17/18 [address], [place], [place] 04/17/18 2110    Fredia Sorrow, MD 04/27/18 [phone]

## 2018-04-17 NOTE — ED Triage Notes (Signed)
Pt stated "my right knee pain has been bothering x 2 weeks, also my right ankle is hurting again.  I haven't seen [facility] x 2 years for my ankle.  It was a WC claim."

## 2018-04-29 ENCOUNTER — Ambulatory Visit: Payer: BC Managed Care – PPO | Admitting: Pharmacist

## 2018-05-14 ENCOUNTER — Other Ambulatory Visit: Payer: Self-pay | Admitting: Emergency Medicine

## 2018-05-14 ENCOUNTER — Other Ambulatory Visit: Payer: Self-pay | Admitting: Family Medicine

## 2018-05-14 DIAGNOSIS — F411 Generalized anxiety disorder: Secondary | ICD-10-CM

## 2018-05-27 ENCOUNTER — Telehealth: Payer: Self-pay | Admitting: Family Medicine

## 2018-05-27 MED ORDER — PREDNISONE 50 MG PO TABS: 50.0000 mg | ORAL_TABLET | Freq: Every day | ORAL | 0 refills | Status: DC

## 2018-05-27 NOTE — Telephone Encounter (Signed)
**  After Hours/ Emergency Line Call*  Received a call to report that [name] is having cough, wheezing which she thinks is asthma flare from pollen.  Endorsing using inhaler 2 puffs every 2 hours and nebs about 2 times a day. Takes zyrtec.  Denying fevers.  Recommended that she continue zyrtec, add flonase, continue albuterol. Rx for course of steroids sent to pharmacy. She is unable to afford Breo (50 dollar copay), she has BCBS, I told her I would send a message to her PCP to see if we can send in another inhaler that is on formulary. Reasons to go to the ED discussed.   Steve Rattler, DO PGY-3, [facility]

## 2018-05-28 NOTE — Telephone Encounter (Signed)
Pt informed. Given information below. Deseree Kennon Holter, CMA

## 2018-06-17 ENCOUNTER — Other Ambulatory Visit: Payer: Self-pay

## 2018-06-17 MED ORDER — SEMAGLUTIDE(0.25 OR 0.5MG/DOS) 2 MG/1.5ML ~~LOC~~ SOPN: 0.2500 mg | PEN_INJECTOR | SUBCUTANEOUS | 2 refills | Status: DC

## 2018-07-01 ENCOUNTER — Other Ambulatory Visit: Payer: Self-pay

## 2018-07-01 DIAGNOSIS — F411 Generalized anxiety disorder: Secondary | ICD-10-CM

## 2018-07-01 NOTE — Telephone Encounter (Signed)
Pt called nurse line very tearful stating she is having a hard time right now with her emotions. Pt stated it is time for her ativan to be refilled. I asked if patient needed to speak with someone and she declined. Pt would like this refilled as soon as possible. Pt has no thoughts to harm herself or others. Please advise.

## 2018-07-06 MED ORDER — LORAZEPAM 1 MG PO TABS: ORAL_TABLET | ORAL | 0 refills | Status: DC

## 2018-07-09 ENCOUNTER — Telehealth (INDEPENDENT_AMBULATORY_CARE_PROVIDER_SITE_OTHER): Payer: BC Managed Care – PPO | Admitting: Family Medicine

## 2018-07-09 ENCOUNTER — Encounter: Payer: Self-pay | Admitting: Family Medicine

## 2018-07-09 ENCOUNTER — Other Ambulatory Visit: Payer: Self-pay

## 2018-07-09 VITALS — Wt 397.0 lb

## 2018-07-09 DIAGNOSIS — F411 Generalized anxiety disorder: Secondary | ICD-10-CM

## 2018-07-15 NOTE — Progress Notes (Signed)
[facility] Telemedicine Visit  Patient consented to have virtual visit. Method of visit: Telephone  Encounter participants: Patient: [name] - located at home Provider: Nuala Alpha - located at clinic Others (if applicable): none  Chief Complaint: Anxiety  HPI: Patient states anxiety is worse lately since she has been dealing with COVID pandemic. She also has been out of work temporarily and this is an additional stressor as well. She is still seeing Psychiatry to help manage her anxiety. We once again discussed the risks of using ativan to control a chronic condition such as anxiety.  ROS: per HPI  Pertinent PMHx: Anxiety, Obesity  Exam:  Respiratory: NWOB, able to speak in full complete sentences  Assessment/Plan:  Anxiety Refilled Ativan for patient. PMP showed she had no recent refills and has only been getting this medication via our clinic. We did discuss the overall plan of continuing to wean down on this medication due to its addictive nature and due to its risk of decreasing respiratory drive when taken at high doses. Encouraged patient to continue to keep Psych appointments. Did not wean medication today but will will do so at next appointment.  Time spent during visit with patient: 10 minutes

## 2018-08-24 ENCOUNTER — Ambulatory Visit (INDEPENDENT_AMBULATORY_CARE_PROVIDER_SITE_OTHER): Payer: BC Managed Care – PPO | Admitting: Family Medicine

## 2018-08-24 ENCOUNTER — Other Ambulatory Visit: Payer: Self-pay

## 2018-08-24 VITALS — BP 106/62 | HR 82

## 2018-08-24 DIAGNOSIS — E118 Type 2 diabetes mellitus with unspecified complications: Secondary | ICD-10-CM

## 2018-08-24 LAB — POCT GLYCOSYLATED HEMOGLOBIN (HGB A1C): HbA1c, POC (controlled diabetic range): 9.8 % — AB (ref 0.0–7.0)

## 2018-08-24 MED ORDER — GABAPENTIN 300 MG PO CAPS: 300.0000 mg | ORAL_CAPSULE | Freq: Three times a day (TID) | ORAL | 1 refills | Status: DC

## 2018-08-24 NOTE — Progress Notes (Signed)
   Subjective:   Patient ID: [name]    DOB: 1970-08-07, 48 y.o. female   MRN: [id number]  CC: numbness in feet  HPI: SHADEN HIGLEY is a 48 y.o. female who presents to clinic today for the following issue.  Numbness in feet She is a diabetic, has not had this symptom before but over the last 3 weeks has been having numbness and tingling in both legs. Describes it as a constant burning feeling in her feet but is not particularly painful.   Neuropathy is worse at night but not worse with walking.  No history of recent fall or injury.  No fever, chills, nausea, vomiting.  No CP, SOB.    ROS: See HPI for pertinent ROS.  Social: pt is a never smoker.  Medications reviewed. Objective:   BP 106/62   Pulse 82   SpO2 99%  Vitals and nursing note reviewed.  General: 48 yo female, NAD  Neck: supple CV: RRR no MRG  Lungs: CTAB, normal effort  Abdomen: soft, +bs  Skin: warm, dry, no rash Extremities: warm and well perfused Neuro: alert, oriented x3, no focal deficits, normal sensation bilaterally in LE, gait normal.   See separate diabetic foot exam documented under quality metrics.    Assessment & Plan:   Type II diabetes mellitus with complication (HCC) Poor control with last A1c 9.7 in 03/2018. Stable at 9.8 today.   Advised to continue on current medication regimen of Ozempic, Basaglar and Metformin, will trial gabapentin for peripheral neuropathy.  Counseled on lifestyle modifications diet and exercise  Follow up 3 months    Orders Placed This Encounter  Procedures  . HgB A1c   Meds ordered this encounter  Medications  . gabapentin (NEURONTIN) 300 MG capsule    Sig: Take 1 capsule (300 mg total) by mouth 3 (three) times daily.    Dispense:  90 capsule    Refill:  1   Lovenia Kim, MD [facility] PGY-3 08/27/2018 11:50 AM

## 2018-08-25 ENCOUNTER — Other Ambulatory Visit: Payer: Self-pay | Admitting: Family Medicine

## 2018-08-25 DIAGNOSIS — F411 Generalized anxiety disorder: Secondary | ICD-10-CM

## 2018-08-27 NOTE — Patient Instructions (Signed)
Diabetic Neuropathy  Diabetic neuropathy refers to nerve damage that is caused by diabetes (diabetes mellitus). Over time, people with diabetes can develop nerve damage throughout the body. There are several types of diabetic neuropathy:  · Peripheral neuropathy. This is the most common type of diabetic neuropathy. It causes damage to nerves that carry signals between the spinal cord and other parts of the body (peripheral nerves). This usually affects nerves in the feet and legs first, and may eventually affect the hands and arms. The damage affects the ability to sense touch or temperature.  · Autonomic neuropathy. This type causes damage to nerves that control involuntary functions (autonomic nerves). These nerves carry signals that control:  ? Heartbeat.  ? Body temperature.  ? Blood pressure.  ? Urination.  ? Digestion.  ? Sweating.  ? Sexual function.  ? Response to changing blood sugar (glucose) levels.  · Focal neuropathy. This type of nerve damage affects one area of the body, such as an arm, a leg, or the face. The injury may involve one nerve or a small group of nerves. Focal neuropathy can be painful and unpredictable, and occurs most often in older adults with diabetes. This often develops suddenly, but usually improves over time and does not cause long-term problems.  · Proximal neuropathy. This type of nerve damage affects the nerves of the thighs, hips, buttocks, or legs. It causes severe pain, weakness, and muscle death (atrophy), usually in the thigh muscles. It is more common among older men and people who have type 2 diabetes. The length of recovery time may vary.  What are the causes?  Peripheral, autonomic, and focal neuropathies are caused by diabetes that is not well controlled with treatment. The cause of proximal neuropathy is not known, but it may be caused by inflammation related to uncontrolled blood glucose levels.  What are the signs or symptoms?  Peripheral neuropathy  Peripheral  neuropathy develops slowly over time. When the nerves of the feet and legs no longer work, you may experience:  · Burning, stabbing, or aching pain in the legs or feet.  · Pain or cramping in the legs or feet.  · Loss of feeling (numbness) and inability to feel pressure or pain in the feet. This can lead to:  ? Thick calluses or sores on areas of constant pressure.  ? Ulcers.  ? Reduced ability to feel temperature changes.  · Foot deformities.  · Muscle weakness.  · Loss of balance or coordination.  Autonomic neuropathy  The symptoms of autonomic neuropathy vary depending on which nerves are affected. Symptoms may include:  · Problems with digestion, such as:  ? Nausea or vomiting.  ? Poor appetite.  ? Bloating.  ? Diarrhea or constipation.  ? Trouble swallowing.  ? Losing weight without trying to.  · Problems with the heart, blood and lungs, such as:  ? Dizziness, especially when standing up.  ? Fainting.  ? Shortness of breath.  ? Irregular heartbeat.  · Bladder problems, such as:  ? Trouble starting or stopping urination.  ? Leaking urine.  ? Trouble emptying the bladder.  ? Urinary tract infections (UTIs).  · Problems with other body functions, such as:  ? Sweat. You may sweat too much or too little.  ? Temperature. You might get hot easily. Or, you might feel cold more than usual.  ? Sexual function. Men may not be able to get or maintain an erection. Women may have vaginal dryness and difficulty with arousal.    Focal neuropathy  Symptoms affect only one area of the body. Common symptoms include:  · Numbness.  · Tingling.  · Burning pain.  · Prickling feeling.  · Very sensitive skin.  · Weakness.  · Inability to move (paralysis).  · Muscle twitching.  · Muscles getting smaller (wasting).  · Poor coordination.  · Double or blurred vision.  Proximal neuropathy  · Sudden, severe pain in the hip, thigh, or buttocks. Pain may spread from the back into the legs (sciatica).  · Pain and numbness in the arms and  legs.  · Tingling.  · Loss of bladder control or bowel control.  · Weakness and wasting of thigh muscles.  · Difficulty getting up from a seated position.  · Abdominal swelling.  · Unexplained weight loss.  How is this diagnosed?  Diagnosis usually involves reviewing your medical history and any symptoms you have. Diagnosis varies depending on the type of neuropathy your health care provider suspects.  Peripheral neuropathy  Your health care provider will check areas that are affected by your nervous system (neurologic exam), such as your reflexes, how you move, and what you can feel. You may have other tests, such as:  · Blood tests.  · Removal and examination of fluid that surrounds the spinal cord (lumbar puncture).  · CT scan.  · MRI.  · A test to check the nerves that control muscles (electromyogram, EMG).  · Tests of how quickly messages pass through your nerves (nerve conduction velocity tests).  · Removal of a small piece of nerve to be examined under a microscope (biopsy).  Autonomic neuropathy  You may have tests, such as:  · Tests to measure your blood pressure and heart rate. This may include monitoring you while you are safely secured to an exam table that moves you from a lying position to an upright position (table tilt test).  · Breathing tests to check your lungs.  · Tests to check how food moves through the digestive system (gastric emptying tests).  · Blood, sweat, or urine tests.  · Ultrasound of your bladder.  · Spinal fluid tests.  Focal neuropathy  This condition may be diagnosed with:  · A neurologic exam.  · CT scan.  · MRI.  · EMG.  · Nerve conduction velocity tests.  Proximal neuropathy  There is no test to diagnose this type of neuropathy. You may have tests to rule out other possible causes of this type of neuropathy. Tests may include:  · X-rays of your spine and lumbar region.  · Lumbar puncture.  · MRI.  How is this treated?  The goal of treatment is to keep nerve damage from getting  worse. The most important part of treatment is keeping your blood glucose level and your A1C level within your target range by following your diabetes management plan. Over time, maintaining lower blood glucose levels helps lessen symptoms. In some cases, you may need prescription pain medicine.  Follow these instructions at home:    Lifestyle    · Do not use any products that contain nicotine or tobacco, such as cigarettes and e-cigarettes. If you need help quitting, ask your health care provider.  · Be physically active every day. Include strength training and balance exercises.  · Follow a healthy meal plan.  · Work with your health care provider to manage your blood pressure.  General instructions  · Follow your diabetes management plan as directed.  ? Check your blood glucose levels as directed   by your health care provider.  ? Keep your blood glucose in your target range as directed by your health care provider.  ? Have your A1C level checked at least two times a year, or as often as told by your health care provider.  · Take over the counter and prescription medicines only as told by your health care provider. This includes insulin and diabetes medicine.  · Do not drive or use heavy machinery while taking prescription pain medicines.  · Check your skin and feet every day for cuts, bruises, redness, blisters, or sores.  · Keep all follow up visits as told by your health care provider. This is important.  Contact a health care provider if:  · You have burning, stabbing, or aching pain in your legs or feet.  · You are unable to feel pressure or pain in your feet.  · You develop problems with digestion, such as:  ? Nausea.  ? Vomiting.  ? Bloating.  ? Constipation.  ? Diarrhea.  ? Abdominal pain.  · You have difficulty with urination, such as inability:  ? To control when you urinate (incontinence).  ? To completely empty the bladder (retention).  · You have palpitations.  · You feel dizzy, weak, or faint when you  stand up.  Get help right away if:  · You cannot urinate.  · You have sudden weakness or loss of coordination.  · You have trouble speaking.  · You have pain or pressure in your chest.  · You have an irregular heart beat.  · You have sudden inability to move a part of your body.  Summary  · Diabetic neuropathy refers to nerve damage that is caused by diabetes. It can affect nerves throughout the entire body, causing numbness and pain in the arms, legs, digestive tract, heart, and other body systems.  · Keep your blood glucose level and your blood pressure in your target range, as directed by your health care provider. This can help prevent neuropathy from getting worse.  · Check your skin and feet every day for cuts, bruises, redness, blisters, or sores.  · Do not use any products that contain nicotine or tobacco, such as cigarettes and e-cigarettes. If you need help quitting, ask your health care provider.  This information is not intended to replace advice given to you by your health care provider. Make sure you discuss any questions you have with your health care provider.  Document Released: 05/05/2001 Document Revised: 04/08/2017 Document Reviewed: 03/31/2016  Elsevier Interactive Patient Education © 2019 [employer]

## 2018-08-27 NOTE — Assessment & Plan Note (Addendum)
Poor control with last A1c 9.7 in 03/2018. Stable at 9.8 today.   Advised to continue on current medication regimen of Ozempic, Basaglar and Metformin, will trial gabapentin for peripheral neuropathy.  Counseled on lifestyle modifications diet and exercise  Follow up 3 months

## 2018-10-06 ENCOUNTER — Other Ambulatory Visit: Payer: Self-pay | Admitting: Family Medicine

## 2018-10-24 ENCOUNTER — Other Ambulatory Visit: Payer: Self-pay | Admitting: Family Medicine

## 2018-11-09 ENCOUNTER — Other Ambulatory Visit: Payer: Self-pay | Admitting: Family Medicine

## 2018-11-09 DIAGNOSIS — F411 Generalized anxiety disorder: Secondary | ICD-10-CM

## 2018-11-22 ENCOUNTER — Other Ambulatory Visit: Payer: Self-pay

## 2018-11-22 ENCOUNTER — Other Ambulatory Visit: Payer: Self-pay | Admitting: *Deleted

## 2018-11-22 ENCOUNTER — Ambulatory Visit (INDEPENDENT_AMBULATORY_CARE_PROVIDER_SITE_OTHER): Payer: BC Managed Care – PPO | Admitting: Family Medicine

## 2018-11-22 VITALS — BP 130/80 | HR 101 | Temp 98.1°F | Wt >= 6400 oz

## 2018-11-22 DIAGNOSIS — Z23 Encounter for immunization: Secondary | ICD-10-CM

## 2018-11-22 DIAGNOSIS — I10 Essential (primary) hypertension: Secondary | ICD-10-CM

## 2018-11-22 DIAGNOSIS — E038 Other specified hypothyroidism: Secondary | ICD-10-CM | POA: Diagnosis not present

## 2018-11-22 DIAGNOSIS — M170 Bilateral primary osteoarthritis of knee: Secondary | ICD-10-CM

## 2018-11-22 DIAGNOSIS — E118 Type 2 diabetes mellitus with unspecified complications: Secondary | ICD-10-CM | POA: Diagnosis not present

## 2018-11-22 DIAGNOSIS — F411 Generalized anxiety disorder: Secondary | ICD-10-CM

## 2018-11-22 LAB — POCT GLYCOSYLATED HEMOGLOBIN (HGB A1C): HbA1c, POC (controlled diabetic range): 10.1 % — AB (ref 0.0–7.0)

## 2018-11-22 MED ORDER — BASAGLAR KWIKPEN 100 UNIT/ML ~~LOC~~ SOPN: PEN_INJECTOR | SUBCUTANEOUS | 0 refills | Status: DC

## 2018-11-22 MED ORDER — PANTOPRAZOLE SODIUM 40 MG PO TBEC: 40.0000 mg | DELAYED_RELEASE_TABLET | Freq: Every day | ORAL | 3 refills | Status: DC

## 2018-11-22 MED ORDER — MELOXICAM 15 MG PO TABS: 15.0000 mg | ORAL_TABLET | Freq: Every day | ORAL | 0 refills | Status: DC

## 2018-11-22 MED ORDER — METOPROLOL SUCCINATE ER 25 MG PO TB24: 25.0000 mg | ORAL_TABLET | Freq: Every day | ORAL | 0 refills | Status: DC

## 2018-11-22 MED ORDER — OZEMPIC (0.25 OR 0.5 MG/DOSE) 2 MG/1.5ML ~~LOC~~ SOPN: 0.2500 mg | PEN_INJECTOR | SUBCUTANEOUS | 2 refills | Status: DC

## 2018-11-22 MED ORDER — METFORMIN HCL 500 MG PO TABS: 1000.0000 mg | ORAL_TABLET | Freq: Every day | ORAL | 2 refills | Status: DC

## 2018-11-22 MED ORDER — ALBUTEROL SULFATE HFA 108 (90 BASE) MCG/ACT IN AERS: 2.0000 | INHALATION_SPRAY | Freq: Four times a day (QID) | RESPIRATORY_TRACT | 3 refills | Status: DC | PRN

## 2018-11-22 MED ORDER — LEVOTHYROXINE SODIUM 300 MCG PO TABS: 300.0000 ug | ORAL_TABLET | Freq: Every day | ORAL | 3 refills | Status: DC

## 2018-11-22 MED ORDER — LISINOPRIL 5 MG PO TABS: 5.0000 mg | ORAL_TABLET | Freq: Every day | ORAL | 0 refills | Status: DC

## 2018-11-22 NOTE — Patient Instructions (Signed)
It was great to see you today! Thank you for letting me participate in your care!  Today, we discussed your knee pain due to osteoarthritis. I have given you a prescription medication to help with pain and some home exercises to do. Losing weight will also help your symptoms. Please keep working to eat a healthy balanced diet and to stay active. 30 minutes per day 5 times per week is a great start!  Be well, Harolyn Rutherford, DO PGY-3, [facility]

## 2018-11-22 NOTE — Progress Notes (Signed)
Subjective: Chief Complaint  Patient presents with  . Flu Vaccine  . knee ankle pain  . Medication Refill   HPI: [name] is a 48 y.o. presenting to clinic today to discuss the following:  HTN Patient compliant with medications. Well controlled at today's office visit. No side effects from medications. No vision changes, no dry cough.  T2DM Patient has had some difficulty with diet during COVID pandemic. She states her diet has "not been the best" and she know she needs to lose weight. Poorly controlled with A1c of 10.1 today. Discussed importance for losing weight.  Anxiety Patient continues to go to [facility] and states she is grateful for their care. She continues to have anxiety and has had a more difficult time than normal due to COVID. She does believe she is coping well but does not feel like decreasing her Ativan would help her at this time.  Osteoarthritis Patient is currently having bilateral knee pain, more on the right than the left. She was recently seen and evaluated by urgent care and x-rays were consistent with mild OA. Patient states the pain is located in her knees, non-radiating, worse with movement, and stiff in the morning.     ROS noted in HPI.    Social History   Tobacco Use  Smoking Status Never Smoker  Smokeless Tobacco Never Used    Objective: BP 130/80   Pulse (!) 101   Temp 98.1 F (36.7 C) (Axillary)   Wt (!) 407 lb 12.8 oz (185 kg)   SpO2 99%   BMI 56.88 kg/m  Vitals and nursing notes reviewed  Physical Exam Gen: Alert and Oriented x 3, NAD CV: RRR, no murmurs, normal S1, S2 split Resp: CTAB, no wheezing, rales, or rhonchi, comfortable work of breathing MSK: Knee, Bilateral: TTP noted at the the medial joint line bilaterally. Inspection was negativefor erythema, ecchymosis, and effusion. No obvious bony abnormalities or signs of osteophyte development. Palpation yielded no asymmetric warmth; No condyle tenderness; No patellar  tenderness; positive for patellar crepitus. Patellar and quadriceps tendons unremarkable, and no tenderness of the pes anserine bursa. Noobvious Baker's cyst development. ROM normal in flexion (135 degrees) and extension (0 degrees). Normal hamstring and quadriceps strength. Neurovascularly intact bilaterally.  - Ligaments: (Solid and consistent endpoints)   - ACL (present bilaterally)   - PCL (present bilaterally)   - LCL (present bilaterally)   - MCL (present bilaterally).   - Additional tests performed:    - Anterior Drawer >> NEG   - Lachman >> NEG   - Pivot Shift >> NEG  - Meniscus:   - Thessaly: NEG   - McMurray's: NEG  - Patella:   - Patellar grind/compression: Positive   - Patellar glide: Without apprehension Ext: no clubbing, cyanosis, or edema Skin: warm, dry, intact, no rashes  Results for orders placed or performed in visit on 11/22/18 (from the past 72 hour(s))  HgB A1c     Status: Abnormal   Collection Time: 11/22/18  2:25 PM  Result Value Ref Range   Hemoglobin A1C     HbA1c POC (<> result, manual entry)     HbA1c, POC (prediabetic range)     HbA1c, POC (controlled diabetic range) 10.1 (A) 0.0 - 7.0 %  Basic Metabolic Panel     Status: Abnormal   Collection Time: 11/22/18  2:45 PM  Result Value Ref Range   Glucose 252 (H) 65 - 99 mg/dL   BUN 21 6 -  24 mg/dL   Creatinine, Ser 1.40 (H) 0.57 - 1.00 mg/dL   GFR calc non Af Amer 44 (L) >59 mL/min/1.73   GFR calc Af Amer 51 (L) >59 mL/min/1.73   BUN/Creatinine Ratio 15 9 - 23   Sodium 137 134 - 144 mmol/L   Potassium 5.0 3.5 - 5.2 mmol/L   Chloride 103 96 - 106 mmol/L   CO2 20 20 - 29 mmol/L   Calcium 8.9 8.7 - 10.2 mg/dL    Assessment/Plan:  Type II diabetes mellitus with complication (HCC) Poorly controlled and slightly increased from last A1c 3 months ago.  - Weight loss and exercise highly encouraged and discussed - Increasing Ozempic from 0.25 weekly to 0.54m weekly; may need to go up to 125mweekly if no  improvement in A1c - Increased Metformin from 100043mo 2000m56mill watch carefully as patient does have eGFR of 51. - Cont Lantus 50U at bedtime; will titrate up if needed if above changes don't help  Essential hypertension, benign Well controlled today. - Cont current regimen of Lisinopril 5mg 58mly and Toprol-XL 25mg 72miety state Cont Ativan. [facility] - Refilled Ativan 50tabs for one month - Decrease by 10% at next visit in one month  Osteoarthritis Bilateral knee OA.  - Meloxicam 15mg f15mwo weeks once daily. - If no improvement in 7 days return to clinic, stop meloxicam, and I will try intraarticular corticosteroid injections in both knees.     PATIENT EDUCATION PROVIDED: See AVS    Diagnosis and plan along with any newly prescribed medication(s) were discussed in detail with this patient today. The patient verbalized understanding and agreed with the plan. Patient advised if symptoms worsen return to clinic or ER.     Orders Placed This Encounter  Procedures  . Flu Vaccine QUAD 36+ mos IM  . Basic Metabolic Panel  . HgB A1c    Meds ordered this encounter  Medications  . lisinopril (ZESTRIL) 5 MG tablet    Sig: Take 1 tablet (5 mg total) by mouth daily.    Dispense:  30 tablet    Refill:  0  . Insulin Glargine (BASAGLAR KWIKPEN) 100 UNIT/ML SOPN    Sig: INJECT 50 UNITS SUBCUTANEOUSLY AT BEDTIME    Dispense:  15 mL    Refill:  0  . levothyroxine (SYNTHROID) 300 MCG tablet    Sig: Take 1 tablet (300 mcg total) by mouth daily.    Dispense:  90 tablet    Refill:  3  . meloxicam (MOBIC) 15 MG tablet    Sig: Take 1 tablet (15 mg total) by mouth daily. TAKE WITH MEALS    Dispense:  15 tablet    Refill:  0  . metFORMIN (GLUCOPHAGE) 500 MG tablet    Sig: Take 2 tablets (1,000 mg total) by mouth daily with breakfast.    Dispense:  60 tablet    Refill:  2  . metoprolol succinate (TOPROL-XL) 25 MG 24  hr tablet    Sig: Take 1 tablet (25 mg total) by mouth daily.    Dispense:  30 tablet    Refill:  0  . pantoprazole (PROTONIX) 40 MG tablet    Sig: Take 1 tablet (40 mg total) by mouth daily.    Dispense:  30 tablet    Refill:  3  . Semaglutide,0.25 or 0.5MG/DOS, (OZEMPIC, 0.25 OR 0.5 MG/DOSE,) 2 MG/1.5ML SOPN    Sig: Inject 0.25 mg  into the skin once a week.    Dispense:  3 mL    Refill:  2    Order Specific Question:   Lot Number?    Answer:   [id number]    Order Specific Question:   Expiration Date?    Answer:   07/08/2019    Order Specific Question:   Quantity    Answer:   I[place], DO 11/22/2018, 1:59 PM PGY-3 [facility]

## 2018-11-23 LAB — BASIC METABOLIC PANEL
BUN/Creatinine Ratio: 15 (ref 9–23)
BUN: 21 mg/dL (ref 6–24)
CO2: 20 mmol/L (ref 20–29)
Calcium: 8.9 mg/dL (ref 8.7–10.2)
Chloride: 103 mmol/L (ref 96–106)
Creatinine, Ser: 1.4 mg/dL — ABNORMAL HIGH (ref 0.57–1.00)
GFR calc Af Amer: 51 mL/min/{1.73_m2} — ABNORMAL LOW (ref >59)
GFR calc non Af Amer: 44 mL/min/{1.73_m2} — ABNORMAL LOW (ref >59)
Glucose: 252 mg/dL — ABNORMAL HIGH (ref 65–99)
Potassium: 5 mmol/L (ref 3.5–5.2)
Sodium: 137 mmol/L (ref 134–144)

## 2018-11-24 DIAGNOSIS — M1990 Unspecified osteoarthritis, unspecified site: Secondary | ICD-10-CM | POA: Insufficient documentation

## 2018-11-24 MED ORDER — METFORMIN HCL 1000 MG PO TABS: 1000.0000 mg | ORAL_TABLET | Freq: Two times a day (BID) | ORAL | 2 refills | Status: DC

## 2018-11-24 MED ORDER — OZEMPIC (0.25 OR 0.5 MG/DOSE) 2 MG/1.5ML ~~LOC~~ SOPN: 0.5000 mg | PEN_INJECTOR | SUBCUTANEOUS | 2 refills | Status: DC

## 2018-11-24 NOTE — Assessment & Plan Note (Signed)
Poorly controlled and slightly increased from last A1c 3 months ago.  - Weight loss and exercise highly encouraged and discussed - Increasing Ozempic from 0.25 weekly to 0.80m weekly; may need to go up to 194mweekly if no improvement in A1c - Increased Metformin from 100072mo 2000m16mill watch carefully as patient does have eGFR of 51. - Cont Lantus 50U at bedtime; will titrate up if needed if above changes don't help

## 2018-11-24 NOTE — Assessment & Plan Note (Signed)
Cont Ativan. Patient continues to see [facility] who manage her other anxiety medications - Refilled Ativan 50tabs for one month - Decrease by 10% at next visit in one month

## 2018-11-24 NOTE — Assessment & Plan Note (Signed)
Well controlled today. - Cont current regimen of Lisinopril 5mg  daily and Toprol-XL 25mg 

## 2018-11-24 NOTE — Assessment & Plan Note (Signed)
>>  ASSESSMENT AND PLAN FOR ANXIETY WRITTEN ON 11/24/2018  3:00 PM BY LOCKAMY, TIMOTHY, DO  Cont Ativan. Patient continues to see Vesta Mixer who manage her other anxiety medications - Refilled Ativan 50tabs for one month - Decrease by 10% at next visit in one month

## 2018-11-24 NOTE — Assessment & Plan Note (Signed)
Bilateral knee OA.  - Meloxicam 15mg  for two weeks once daily. - If no improvement in 7 days return to clinic, stop meloxicam, and I will try intraarticular corticosteroid injections in both knees.

## 2018-12-07 ENCOUNTER — Other Ambulatory Visit: Payer: Self-pay | Admitting: *Deleted

## 2018-12-08 MED ORDER — MELOXICAM 15 MG PO TABS: 15.0000 mg | ORAL_TABLET | Freq: Every day | ORAL | 0 refills | Status: DC

## 2018-12-13 ENCOUNTER — Other Ambulatory Visit: Payer: Self-pay

## 2018-12-13 NOTE — Telephone Encounter (Signed)
Patient LVM on nurse line stating she wanted a refill on Meloxicam. I saw where once was sent end of September, however it was set to "print." I do not believe she was in the office to get paper rx. Will forward to PCP.

## 2018-12-15 MED ORDER — MELOXICAM 15 MG PO TABS: 15.0000 mg | ORAL_TABLET | Freq: Every day | ORAL | 0 refills | Status: DC

## 2019-01-06 ENCOUNTER — Other Ambulatory Visit: Payer: Self-pay

## 2019-01-06 ENCOUNTER — Ambulatory Visit: Payer: BC Managed Care – PPO | Admitting: Student in an Organized Health Care Education/Training Program

## 2019-01-06 ENCOUNTER — Encounter: Payer: Self-pay | Admitting: Student in an Organized Health Care Education/Training Program

## 2019-01-06 VITALS — BP 140/90 | HR 100 | Ht 71.0 in | Wt >= 6400 oz

## 2019-01-06 DIAGNOSIS — M170 Bilateral primary osteoarthritis of knee: Secondary | ICD-10-CM

## 2019-01-06 DIAGNOSIS — M19079 Primary osteoarthritis, unspecified ankle and foot: Secondary | ICD-10-CM | POA: Diagnosis not present

## 2019-01-06 DIAGNOSIS — E118 Type 2 diabetes mellitus with unspecified complications: Secondary | ICD-10-CM | POA: Diagnosis not present

## 2019-01-06 LAB — GLUCOSE, POCT (MANUAL RESULT ENTRY): POC Glucose: 209 mg/dl — AB (ref 70–99)

## 2019-01-06 MED ORDER — METHYLPREDNISOLONE ACETATE 40 MG/ML IJ SUSP
40.0000 mg | Freq: Once | INTRAMUSCULAR | Status: AC
  Administered 2019-01-06: 40 mg via INTRAMUSCULAR

## 2019-01-06 NOTE — Patient Instructions (Signed)
It was a pleasure to see you today!  To summarize our discussion for this visit:  We injected both your right knee and right ankle with steroids today.  As we discussed, it may take a couple of days for the pain relief to take full effect and some people experience a short period of relief and some people experience a longer period of relief.  If you experience any increased redness swelling or warmth of your joints please let us know immediately.  Follow-up with your PCP   Call the clinic at [phone] if your symptoms worsen or you have any concerns.   Thank you for allowing me to take part in your care,  Dr. Doristine Mango   Knee Injection A knee injection is a procedure to get medicine into your knee joint to relieve the pain, swelling, and stiffness of arthritis. Your health care provider uses a needle to inject medicine, which may also help to lubricate and cushion your knee joint. You may need more than one injection. Tell a health care provider about:  Any allergies you have.  All medicines you are taking, including vitamins, herbs, eye drops, creams, and over-the-counter medicines.  Any problems you or family members have had with anesthetic medicines.  Any blood disorders you have.  Any surgeries you have had.  Any medical conditions you have.  Whether you are pregnant or may be pregnant. What are the risks? Generally, this is a safe procedure. However, problems may occur, including:  Infection.  Bleeding.  Symptoms that get worse.  Damage to the area around your knee.  Allergic reaction to any of the medicines.  Skin reactions from repeated injections. What happens before the procedure?  Ask your health care provider about changing or stopping your regular medicines. This is especially important if you are taking diabetes medicines or blood thinners.  Plan to have someone take you home from the hospital or clinic. What happens during the procedure?   You will sit or lie down in a position for your knee to be treated.  The skin over your kneecap will be cleaned with a germ-killing soap.  You will be given a medicine that numbs the area (local anesthetic). You may feel some stinging.  The medicine will be injected into your knee. The needle is carefully placed between your kneecap and your knee. The medicine is injected into the joint space.  The needle will be removed at the end of the procedure.  A bandage (dressing) may be placed over the injection site. The procedure may vary among health care providers and hospitals. What can I expect after the procedure?  Your blood pressure, heart rate, breathing rate, and blood oxygen level will be monitored until you leave the hospital or clinic.  You may have to move your knee through its full range of motion. This helps to get all the medicine into your joint space.  You will be watched to make sure that you do not have a reaction to the injected medicine.  You may feel more pain, swelling, and warmth than you did before the injection. This reaction may last about 1-2 days. Follow these instructions at home: Medicines  Take over-the-counter and prescription medicines only as told by your doctor.  Do not drive or use heavy machinery while taking prescription pain medicine.  Do not take medicines such as aspirin and ibuprofen unless your health care provider tells you to take them. Injection site care  Follow instructions from your health  care provider about: ? How to take care of your puncture site. ? When and how you should change your dressing. ? When you should remove your dressing.  Check your injection area every day for signs of infection. Check for: ? More redness, swelling, or pain after 2 days. ? Fluid or blood. ? Pus or a bad smell. ? Warmth. Managing pain, stiffness, and swelling   If directed, put ice on the injection area: ? Put ice in a plastic bag. ? Place a  towel between your skin and the bag. ? Leave the ice on for 20 minutes, 2-3 times per day.  Do not apply heat to your knee.  Raise (elevate) the injection area above the level of your heart while you are sitting or lying down. General instructions  If you were given a dressing, keep it dry until your health care provider says it can be removed. Ask your health care provider when you can start showering or taking a bath.  Avoid strenuous activities for as long as directed by your health care provider. Ask your health care provider when you can return to your normal activities.  Keep all follow-up visits as told by your health care provider. This is important. You may need more injections. Contact a health care provider if you have:  A fever.  Warmth in your injection area.  Fluid, blood, or pus coming from your injection site.  Symptoms at your injection site that last longer than 2 days after your procedure. Get help right away if:  Your knee: ? Turns very red. ? Becomes very swollen. ? Is in severe pain. Summary  A knee injection is a procedure to get medicine into your knee joint to relieve the pain, swelling, and stiffness of arthritis.  A needle is carefully placed between your kneecap and your knee to inject medicine into the joint space.  Before the procedure, ask your health care provider about changing or stopping your regular medicines, especially if you are taking diabetes medicines or blood thinners.  Contact your health care provider if you have any problems or questions after your procedure. This information is not intended to replace advice given to you by your health care provider. Make sure you discuss any questions you have with your health care provider. Document Released: 05/18/2006 Document Revised: 03/16/2017 Document Reviewed: 03/16/2017 Elsevier Patient Education  2020 [employer].

## 2019-01-06 NOTE — Assessment & Plan Note (Addendum)
R knee and ankle steroid injection given today - CBG 209, recommended to monitor blood sugars at home -Given return precautions for signs of infection

## 2019-01-06 NOTE — Progress Notes (Signed)
   Subjective:    Patient ID: [name], female    DOB: 04-15-1970, 48 y.o.   MRN: [id number]  CC: Knee injections  HPI:  Patient has longstanding bilateral knee pain and ankle pain which has progressively gotten worse over the years.  She is gotten x-ray showing arthritis.  She had take in Mobic for great relief but was discontinued by PCP.  Patient continues to take Tylenol and ibuprofen.  She states she takes 2 pills of Tylenol every 4 hours throughout the day and does not know the dose of the Tylenol.  She does not know the dose of the ibuprofen either.  These medications do help improve the pain but she is limited at work from the pain.  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  She is very desperate for relief of the pain at this time.  She has had an injection in her right knee and ankle in approximately January which provided her with great relief.  ROS: pertinent noted in the HPI   I have personally reviewed pertinent past medical history, surgical, family, and social history as appropriate.  Objective:  BP 140/90   Pulse 100   Ht 5\' 11"  (1.803 m)   Wt (!) 402 lb (182.3 kg)   LMP 12/16/2018   SpO2 96%   BMI 56.07 kg/m   Vitals and nursing note reviewed  General: NAD, very pleasant, obese, able to participate in exam Knee: Bilateral knees demonstrate valgus deformity.  Negative for joint line tenderness bilaterally.  Knee flexion symmetrical and decreased bilaterally secondary to body habitus. Ankle: Negative for tenderness to palpation, range of motion normal and symmetrical bilateral.  Normal gait. Skin: warm and dry, no rashes noted Neuro: alert, no obvious focal deficits Psych: Normal affect and mood  Assessment & Plan:   Osteoarthritis R knee and ankle steroid injection given today - CBG 209, recommended to monitor blood sugars at home -Given return precautions for signs of infection  Orders Placed This Encounter  Procedures  . Glucose (CBG)    Meds ordered this encounter  Medications  . methylPREDNISolone acetate (DEPO-MEDROL) injection 40 mg  . methylPREDNISolone acetate (DEPO-MEDROL) injection 40 mg   I independently examined pertinent imaging in relation to problem.  R Knee Injection: Written and verbal consent was obtained after discussing the risks and benefits of the procedure with the patient. The anterior knee was cleansed in a sterile fashion with alcohol x2.  1 cc 40 mg Depo-medrol and 3 cc 1% Lidocaine was injected using an antero medial approach using a 5 cc syringe and 25 gauge 2 in needle. No complications were encountered. Minimal blood loss. A band aid was applied.   R Ankle Injection: Written and verbal consent was obtained after discussing the risks and benefits of the procedure with the patient. The medial ankle was cleansed in a sterile fashion with alcohol.  1 cc 40 mg Depo-medrol and 3 cc 1% Lidocaine was injected using a medial approach using a 5 cc syringe and 25 gauge 1.5 in needle and ultrasound guidance showing moderate joint effusion. No complications were encountered. Minimal blood loss. A band aid was applied.  This procedure performed by fellow Sheppard Coil.  Doristine Mango, [facility] PGY-2

## 2019-01-12 ENCOUNTER — Other Ambulatory Visit: Payer: Self-pay | Admitting: Family Medicine

## 2019-01-12 DIAGNOSIS — F411 Generalized anxiety disorder: Secondary | ICD-10-CM

## 2019-01-14 ENCOUNTER — Ambulatory Visit: Payer: BC Managed Care – PPO

## 2019-03-14 ENCOUNTER — Ambulatory Visit (INDEPENDENT_AMBULATORY_CARE_PROVIDER_SITE_OTHER): Payer: BC Managed Care – PPO | Admitting: Family Medicine

## 2019-03-14 ENCOUNTER — Other Ambulatory Visit: Payer: Self-pay

## 2019-03-14 ENCOUNTER — Encounter: Payer: Self-pay | Admitting: Family Medicine

## 2019-03-14 VITALS — BP 134/82 | HR 109 | Wt 395.0 lb

## 2019-03-14 DIAGNOSIS — M25562 Pain in left knee: Secondary | ICD-10-CM | POA: Insufficient documentation

## 2019-03-14 DIAGNOSIS — E118 Type 2 diabetes mellitus with unspecified complications: Secondary | ICD-10-CM

## 2019-03-14 DIAGNOSIS — M25561 Pain in right knee: Secondary | ICD-10-CM | POA: Insufficient documentation

## 2019-03-14 DIAGNOSIS — G8929 Other chronic pain: Secondary | ICD-10-CM | POA: Diagnosis not present

## 2019-03-14 LAB — POCT GLYCOSYLATED HEMOGLOBIN (HGB A1C): HbA1c, POC (controlled diabetic range): 10.2 % — AB (ref 0.0–7.0)

## 2019-03-14 LAB — GLUCOSE, POCT (MANUAL RESULT ENTRY): POC Glucose: 275 mg/dl — AB (ref 70–99)

## 2019-03-14 NOTE — Patient Instructions (Signed)
It was great to see you today! Thank you for letting me participate in your care!  Today, we discussed your left knee pain which is due to osteoarthritis. Please continue to eat healthy and try to continue losing weight and perfrom regular weight bearing exercise as these are the only long term solutions to help decrease pain.   I gave you an injection of steroid today in your left knee. It will temporarily increase your blood sugar. Please make sure to check it tonight before you go to bed. If you begin to experience any symptoms such as confusion, fatigue, difficulty breathing, with increased urination please call 911.  Be well, Harolyn Rutherford, DO PGY-3, [facility]

## 2019-03-14 NOTE — Progress Notes (Signed)
     Subjective: Chief Complaint  Patient presents with  . knee injection    HPI: [name] is a 49 y.o. presenting to clinic today to discuss the following:  Left Knee Pain Patient has history of T2DM, anxiety, and osteoarthritis presenting today for left knee pain. It has been ongoing and she was here on 10/29 for right knee and ankle pain which got better with a corticosteroid injection. Her left knee pain is "the same" as her right knee pain, located in the joint, non-radiating, achy pain, that is worse with activity and standing for long periods of time.   ROS noted in HPI.    Social History   Tobacco Use  Smoking Status Never Smoker  Smokeless Tobacco Never Used    Objective: BP 134/82   Pulse (!) 109   Wt (!) 395 lb (179.2 kg)   SpO2 99%   BMI 55.09 kg/m  Vitals and nursing notes reviewed  Physical Exam  Knee, Left: . Inspection was negative for erythema, ecchymosis, and effusion. No obvious bony abnormalities or signs of osteophyte development. Palpation yielded no asymmetric warmth; No joint line tenderness; No condyle tenderness; No patellar tenderness; Positive for patellar crepitus. Patellar and quadriceps tendons unremarkable, and no tenderness of the pes anserine bursa. No obvious Baker's cyst development. ROM normal in flexion (135 degrees) and extension (0 degrees). Normal hamstring and quadriceps strength. Neurovascularly intact bilaterally.  - Ligaments: (Solid and consistent endpoints)   - ACL (present bilaterally)   - PCL (present bilaterally)  - Additional tests performed:    - Anterior Drawer >> NEG  Results for orders placed or performed in visit on 03/14/19 (from the past 72 hour(s))  HgB A1c     Status: Abnormal   Collection Time: 03/14/19  3:02 PM  Result Value Ref Range   Hemoglobin A1C     HbA1c POC (<> result, manual entry)     HbA1c, POC (prediabetic range)     HbA1c, POC (controlled diabetic range) 10.2 (A) 0.0 - 7.0 %     Assessment/Plan:  Left knee pain Left knee osteoarthritis, chronic condition that has acute exacerbation. Patient has lost 12lbs in the past 6 months. - Cont to lose weight and exercise - 4:1 Depomedrol to lidocaine injection today - F/u in 3 months   Knee Injection: Written and verbal consent was obtained after discussing the risks and benefits of the procedure with the patient. The anterior knee was cleansed in a sterile fashion with betadine. 40 mg/ml Depo-medrol and 4 cc 1% Lidocaine was injected using an anterolateral approach using a 5 cc syringe and 25 gauge 11/2 in needle. No complications were encountered. Minimal blood loss. A band aid was applied.    PATIENT EDUCATION PROVIDED: See AVS    Diagnosis and plan along with any newly prescribed medication(s) were discussed in detail with this patient today. The patient verbalized understanding and agreed with the plan. Patient advised if symptoms worsen return to clinic or ER.    Orders Placed This Encounter  Procedures  . HgB A1c  . Glucose (CBG)    No orders of the defined types were placed in this encounter.    Harolyn Rutherford, DO 03/14/2019, 3:12 PM PGY-3 [place]

## 2019-03-14 NOTE — Assessment & Plan Note (Signed)
Left knee osteoarthritis, chronic condition that has acute exacerbation. Patient has lost 12lbs in the past 6 months. - Cont to lose weight and exercise - 4:1 Depomedrol to lidocaine injection today - F/u in 3 months

## 2019-03-15 MED ORDER — METHYLPREDNISOLONE ACETATE 40 MG/ML IJ SUSP
40.0000 mg | Freq: Once | INTRAMUSCULAR | Status: AC
  Administered 2019-03-14: 40 mg via INTRAMUSCULAR

## 2019-03-15 NOTE — Addendum Note (Signed)
Addended by: Delray Alt C on: 03/15/2019 08:00 AM   Modules accepted: Orders

## 2019-03-25 ENCOUNTER — Other Ambulatory Visit: Payer: Self-pay | Admitting: Family Medicine

## 2019-03-29 ENCOUNTER — Ambulatory Visit (INDEPENDENT_AMBULATORY_CARE_PROVIDER_SITE_OTHER): Payer: BC Managed Care – PPO | Admitting: Family Medicine

## 2019-03-29 ENCOUNTER — Ambulatory Visit
Admission: RE | Admit: 2019-03-29 | Discharge: 2019-03-29 | Disposition: A | Discharge status: Home / Self Care | Payer: BC Managed Care – PPO | Source: Ambulatory Visit | Attending: Family Medicine | Admitting: Family Medicine

## 2019-03-29 ENCOUNTER — Other Ambulatory Visit: Payer: Self-pay

## 2019-03-29 VITALS — BP 130/84 | HR 100

## 2019-03-29 DIAGNOSIS — M79671 Pain in right foot: Secondary | ICD-10-CM | POA: Diagnosis not present

## 2019-03-29 MED ORDER — MELOXICAM 15 MG PO TABS: 15.0000 mg | ORAL_TABLET | Freq: Every day | ORAL | 0 refills | Status: DC

## 2019-03-29 NOTE — Patient Instructions (Addendum)
It was nice to see you today,  I would like to check an x-ray of your foot to make sure you do not have something called a Lisfranc injury.  While we figure this out, you should take anti-inflammatory medication for pain.  I have prescribed you Mobic.  You can take this once a day for 2 weeks.  If you are taking his medication do not take ibuprofen, Motrin, Advil, Aleve.  You can take Tylenol as well.  You can also ice the area.  Using your foot wrap to help keep pressure on the foot will also help with your pain as well as elevating it when you are not on it.  Try to avoid walking on it and putting weight on it as much as possible.  We may need to refer you to the sports medicine clinic if the x-ray does not help [name] narrow down the diagnosis.  There they can do an ultrasound to check for injury to the foot as well.  Have a great day,  Addison Naegeli, MD

## 2019-03-29 NOTE — Progress Notes (Signed)
Date of Visit: 03/29/2019   HPI:  Shadara presents today for right ankle pain.  Patient describes 3 day onset of right sided foot/ankle pain. Identifies pain in the medial and lateral foot and ankle. She describes the pain as a sharp, continuous and aching pain that is worse in the morning but exists throughout the day. Her pain is reproducible while weightbearing. In addition, she notes that use of her ankle brace also leads to further aggravation. She has attempted to use ice, elevation and tylenol to alleviate the pain but note that none of these interventions are helpful. She had a work related ankle injury in 2016. She denies any trauma to her foot.   ROS: See HPI.  [place]: She has a PMHx of osteoarthritis in her right ankle and bilateral knees. In addition, she has a surgical history of a right ankle arthroscopy in 2017 following a work related injury to her right ankle.   PHYSICAL EXAM: BP 130/84   Pulse 100   SpO2 98%  Gen: Well appearing, conversing appropriately  MSK: bilateral pes planus, bilateral ankle edema, tenderness to palpation in soles and dorsum of 1st and 5th metatarsal midshaft regions and lateral ankle, 5/5 dorsiflexion and plantar flexion bilaterally    ASSESSMENT/PLAN:  Right foot pain: - weightbearing foot x-ray - Mobic QD for pain relief for 2 weeks  FOLLOW UP: Follow up as needed  Maudry Diego, [place]  Resident Addendum I have separately seen and examined the patient.  I have discussed the findings and exam with the medical student and agree with the above note.  I helped develop the management plan that is described in the student's note and I agree with the content.  I have outlined my exam, assessment, and plan below:  Given the patient's location of her tenderness, mainly on the dorsal midfoot but not over the peroneal or posterior tibialis tendons, and given her weight at almost 400 pounds and lack of traumatic inciting  incident, I was concerned that the patient may have a Lisfranc stress injury.  Therefore I obtained a standing x-ray of the ankle/foot with additional views.  Based on those results, it appears the patient's pain is due to inflammation caused by irritation from osteophytes of which multiple were noted in the x-ray.  The Mobic may calm down the inflammation enough to where her pain resolves, but the patient may need referral to an orthopedist as this is not something that will easily resolve.   Addison Naegeli, MD PGY-2 [facility] residency program

## 2019-04-11 ENCOUNTER — Other Ambulatory Visit: Payer: Self-pay | Admitting: Family Medicine

## 2019-04-11 DIAGNOSIS — F411 Generalized anxiety disorder: Secondary | ICD-10-CM

## 2019-04-14 ENCOUNTER — Other Ambulatory Visit: Payer: Self-pay | Admitting: Family Medicine

## 2019-05-09 ENCOUNTER — Other Ambulatory Visit: Payer: Self-pay | Admitting: Family Medicine

## 2019-06-16 ENCOUNTER — Other Ambulatory Visit: Payer: Self-pay | Admitting: Family Medicine

## 2019-06-16 DIAGNOSIS — F411 Generalized anxiety disorder: Secondary | ICD-10-CM

## 2019-07-13 ENCOUNTER — Encounter: Payer: Self-pay | Admitting: Family Medicine

## 2019-07-13 ENCOUNTER — Other Ambulatory Visit: Payer: Self-pay

## 2019-07-13 ENCOUNTER — Ambulatory Visit (INDEPENDENT_AMBULATORY_CARE_PROVIDER_SITE_OTHER): Payer: BC Managed Care – PPO | Admitting: Family Medicine

## 2019-07-13 VITALS — BP 124/86 | HR 101 | Ht 71.0 in | Wt >= 6400 oz

## 2019-07-13 DIAGNOSIS — E782 Mixed hyperlipidemia: Secondary | ICD-10-CM

## 2019-07-13 DIAGNOSIS — I10 Essential (primary) hypertension: Secondary | ICD-10-CM | POA: Diagnosis not present

## 2019-07-13 DIAGNOSIS — M25562 Pain in left knee: Secondary | ICD-10-CM

## 2019-07-13 DIAGNOSIS — G8929 Other chronic pain: Secondary | ICD-10-CM

## 2019-07-13 DIAGNOSIS — E118 Type 2 diabetes mellitus with unspecified complications: Secondary | ICD-10-CM | POA: Diagnosis not present

## 2019-07-13 LAB — POCT GLYCOSYLATED HEMOGLOBIN (HGB A1C): HbA1c, POC (controlled diabetic range): 9.6 % — AB (ref 0.0–7.0)

## 2019-07-13 NOTE — Patient Instructions (Signed)
It was great to see you today! Thank you for letting me participate in your care!  Today, we discussed your left knee pain which I suspect is due to osteoarthritis. I will obtain some x-rays today to rule out other possible causes.   I got lab work today and we will discuss when you return for your follow up appointment. In the meantime, please take your medications as prescribed.  Be well, Harolyn Rutherford, DO PGY-3, [facility]

## 2019-07-13 NOTE — Progress Notes (Addendum)
SUBJECTIVE:   CHIEF COMPLAINT / HPI:   HTN Well controlled. She states she is compliant with her medications. No side effect or difficulty in obtaining medications. She does endorse headache but feels it is more related to the seasonal weather changes and sinuses than anything else. No vision changes.  T2DM Poorly controlled. She admits to having some side effects of abdominal discomfort with taking metfromin so she has not been taking it as prescribed. She has only been taking it once per day in the mornings, and most days not taking it in the afternoon. She has also only been taking the 558m not the 10014mwhen she does take it. She has been struggling with diet and exercise. She did become tearful and stressed when I told her that Metformin was important and she needed to keep taking it. She felt bad for not taking it as prescribed and became worried about her diabetes. I tried to console patient that with proper medical management, exercise and weight loss we can achieve control.  HLD History of elevated LDL. Needs to be rechecked as it has been over one year.  Left Knee Pain Patient is focused on her knee pain today. Today, it is her left knee. Both knees bother her but her left one over the past few weeks has been hurting more. She states she did not really get any relief from the steroid shot she received at her last visit. The pain in non-radiating and stays to the inside of her knee. It hurts worse to walk or do activities and makes it hard for her to do her job. She is using Biofreeze, Voltaren gel, IcyHot with minimal relief. She is also wearing her knee brace some. She declines formal PT.  PERTINENT  PMH / PSH: Anxiety, Depression, HTN, T2DM, HLD, OA of right knee  OBJECTIVE:   BP 124/86   Pulse (!) 101   Ht '5\' 11"'  (1.803 m)   Wt (!) 404 lb (183.3 kg)   SpO2 99%   BMI 56.35 kg/m   Gen: NAD but tearful Cardio: RRR, no murmurs, faint heart sounds Resp: CTAB Knee, Left:  TTP noted at the medial joint space. Inspection was negative for erythema, ecchymosis, and effusion. No obvious bony abnormalities or signs of osteophyte development. Palpation yielded no asymmetric warmth; Medial joint line tenderness; No condyle tenderness; No patellar tenderness; Positive patellar crepitus. Patellar and quadriceps tendons unremarkable, and no tenderness of the pes anserine bursa. No obvious Baker's cyst development. ROM normal in flexion (135 degrees) and extension (0 degrees). Normal hamstring and quadriceps strength. Neurovascularly intact bilaterally.  - Ligaments: (Solid and consistent endpoints)   - ACL (present bilaterally)   - PCL (present bilaterally)   - LCL (present bilaterally)   - MCL (present bilaterally).   - Additional tests performed:    - Anterior Drawer: NEG  - Meniscus:   - McMurray's: NEG  ASSESSMENT/PLAN:   HLD (hyperlipidemia) LDL elevated so not well controlled and patient is a diabetic so needs to be on statin. - Atorvastatin 4029migh intensity  Type II diabetes mellitus with complication (HCCWinchestertill poorly controlled and patient not compliant with medications. - Instructed patient to increase Metformin to 1000m90mD and to take it as instructed - Will increase Ozempic to 1mg 58mly as well - BMP today showed eGFR of 46 which is right at the cutoff. She will return in one week and I will repeat BMP, may need to decrease/stop metformin all together if  GFR does not improve. - Follow up in one week  Left knee pain OA not well controlled. Did not respond to steroid injection and is not willing to go to formal PT. She does not want surgery at this time. Limited options. - x-rays to rule out any other pathology that might be contributing to pain but suspect OA of left knee. - Cont with onnservative measures such as ice, heat, OTC topicals, and tylenol. AVOID NSAIDS as kidney function is compromised.  Essential hypertension, benign Well controlled  today - Cont current regimen of metoprolol 48m daily and Lisinopril 567mdaily - Given her recent eGFR may need to stop ACEi, she has follow up in one week     [place]uala AlphaDOPemberton [place]

## 2019-07-14 LAB — LIPID PANEL
Chol/HDL Ratio: 3.8 ratio (ref 0.0–4.4)
Cholesterol, Total: 195 mg/dL (ref 100–199)
HDL: 52 mg/dL (ref >39)
LDL Chol Calc (NIH): 101 mg/dL — ABNORMAL HIGH (ref 0–99)
Triglycerides: 251 mg/dL — ABNORMAL HIGH (ref 0–149)
VLDL Cholesterol Cal: 42 mg/dL — ABNORMAL HIGH (ref 5–40)

## 2019-07-14 LAB — BASIC METABOLIC PANEL
BUN/Creatinine Ratio: 17 (ref 9–23)
BUN: 25 mg/dL — ABNORMAL HIGH (ref 6–24)
CO2: 21 mmol/L (ref 20–29)
Calcium: 9 mg/dL (ref 8.7–10.2)
Chloride: 100 mmol/L (ref 96–106)
Creatinine, Ser: 1.5 mg/dL — ABNORMAL HIGH (ref 0.57–1.00)
GFR calc Af Amer: 46 mL/min/{1.73_m2} — ABNORMAL LOW (ref >59)
GFR calc non Af Amer: 40 mL/min/{1.73_m2} — ABNORMAL LOW (ref >59)
Glucose: 236 mg/dL — ABNORMAL HIGH (ref 65–99)
Potassium: 5.4 mmol/L — ABNORMAL HIGH (ref 3.5–5.2)
Sodium: 137 mmol/L (ref 134–144)

## 2019-07-15 ENCOUNTER — Other Ambulatory Visit: Payer: Self-pay | Admitting: Family Medicine

## 2019-07-15 ENCOUNTER — Telehealth: Payer: Self-pay | Admitting: *Deleted

## 2019-07-15 MED ORDER — OZEMPIC (0.25 OR 0.5 MG/DOSE) 2 MG/1.5ML ~~LOC~~ SOPN: 1.0000 mg | PEN_INJECTOR | SUBCUTANEOUS | 2 refills | Status: DC

## 2019-07-15 MED ORDER — ATORVASTATIN CALCIUM 40 MG PO TABS: 40.0000 mg | ORAL_TABLET | Freq: Every day | ORAL | 3 refills | Status: DC

## 2019-07-15 NOTE — Assessment & Plan Note (Signed)
Still poorly controlled and patient not compliant with medications. - Instructed patient to increase Metformin to 1027m BID and to take it as instructed - Will increase Ozempic to 185mdaily as well - BMP today showed eGFR of 46 which is right at the cutoff. She will return in one week and I will repeat BMP, may need to decrease/stop metformin all together if GFR does not improve. - Follow up in one week

## 2019-07-15 NOTE — Telephone Encounter (Signed)
Called and spoke with pt and she stated that she was avoiding the follow up appt. Attempted to schedule for next week but she cant do tuesdays or Wednesday. Wants to know if she could try the week after. Please advise.Oza Oberle Kennon Holter, CMA

## 2019-07-15 NOTE — Progress Notes (Signed)
Patient needs to be on statin

## 2019-07-15 NOTE — Telephone Encounter (Signed)
-----   Message from Nuala Alpha, DO sent at 07/15/2019 10:55 AM EDT ----- Regarding: Appointment Can we please call [name] and let her know it is VERY IMPORTANT she follows up with me in clinic next week. I need to recheck her kidney function and may need to make some medications adjustments. I saw her this week and asked her to make a follow up with me in one week and I don't see anything scheduled. I just want to make sure we touch base with her and let her know she has to come in next week. I have spots available so please schedule her with me. Thanks!  Tim

## 2019-07-15 NOTE — Assessment & Plan Note (Signed)
OA not well controlled. Did not respond to steroid injection and is not willing to go to formal PT. She does not want surgery at this time. Limited options. - x-rays to rule out any other pathology that might be contributing to pain but suspect OA of left knee. - Cont with onnservative measures such as ice, heat, OTC topicals, and tylenol. AVOID NSAIDS as kidney function is compromised.

## 2019-07-15 NOTE — Assessment & Plan Note (Signed)
Well controlled today - Cont current regimen of metoprolol 63m daily and Lisinopril 544mdaily - Given her recent eGFR may need to stop ACEi, she has follow up in one week

## 2019-07-15 NOTE — Assessment & Plan Note (Signed)
LDL elevated so not well controlled and patient is a diabetic so needs to be on statin. - Atorvastatin 40mg  high intensity

## 2019-07-18 ENCOUNTER — Other Ambulatory Visit: Payer: Self-pay | Admitting: Family Medicine

## 2019-07-18 DIAGNOSIS — I10 Essential (primary) hypertension: Secondary | ICD-10-CM

## 2019-07-18 NOTE — Telephone Encounter (Signed)
Appt made for Lab on 5/12 and with Dr. Garlan Fillers on 5/17. Christen Bame, CMA

## 2019-07-18 NOTE — Progress Notes (Signed)
Bmp needs to be repeated to ensure she is properly controlled on correct doses of medications. May need to adjust Metformin dose back down if GFR is not improved. This would be unfortunate as I do believe Metformin would help [name] achieve better blood glucose control. I will keep supporting her and adjusting other medications as able.

## 2019-07-20 ENCOUNTER — Other Ambulatory Visit: Payer: BC Managed Care – PPO

## 2019-07-20 ENCOUNTER — Other Ambulatory Visit: Payer: Self-pay

## 2019-07-20 DIAGNOSIS — I10 Essential (primary) hypertension: Secondary | ICD-10-CM

## 2019-07-21 ENCOUNTER — Telehealth: Payer: Self-pay

## 2019-07-21 LAB — BASIC METABOLIC PANEL
BUN/Creatinine Ratio: 19 (ref 9–23)
BUN: 27 mg/dL — ABNORMAL HIGH (ref 6–24)
CO2: 23 mmol/L (ref 20–29)
Calcium: 9.7 mg/dL (ref 8.7–10.2)
Chloride: 101 mmol/L (ref 96–106)
Creatinine, Ser: 1.39 mg/dL — ABNORMAL HIGH (ref 0.57–1.00)
GFR calc Af Amer: 51 mL/min/{1.73_m2} — ABNORMAL LOW (ref >59)
GFR calc non Af Amer: 44 mL/min/{1.73_m2} — ABNORMAL LOW (ref >59)
Glucose: 157 mg/dL — ABNORMAL HIGH (ref 65–99)
Potassium: 5.3 mmol/L — ABNORMAL HIGH (ref 3.5–5.2)
Sodium: 140 mmol/L (ref 134–144)

## 2019-07-21 NOTE — Telephone Encounter (Signed)
Patient calls nurse line reporting of hand and feet swelling. Patient stated this is a new problem. Patient reports she woke up this morning a noticed swelling in both hands and feet without a known cause. Patient denies SOB, pain in areas, or chest pain. Patient reported she had cereal last night, no salty foods. Patient has not checked her sugars today, however feels not related. No other symptoms besides swelling. Patient does report she was called into work last night and this might have something to do wit it. Patient has an apt on Monday with PCP. Patient to call if swelling worsens before then and ED precautions were given.

## 2019-07-24 ENCOUNTER — Other Ambulatory Visit: Payer: Self-pay | Admitting: Family Medicine

## 2019-07-24 DIAGNOSIS — F411 Generalized anxiety disorder: Secondary | ICD-10-CM

## 2019-07-25 ENCOUNTER — Ambulatory Visit (INDEPENDENT_AMBULATORY_CARE_PROVIDER_SITE_OTHER): Payer: BC Managed Care – PPO | Admitting: Family Medicine

## 2019-07-25 ENCOUNTER — Other Ambulatory Visit: Payer: Self-pay

## 2019-07-25 ENCOUNTER — Encounter: Payer: Self-pay | Admitting: Family Medicine

## 2019-07-25 VITALS — BP 146/80 | HR 91 | Ht 71.0 in | Wt >= 6400 oz

## 2019-07-25 DIAGNOSIS — N1831 Chronic kidney disease, stage 3a: Secondary | ICD-10-CM

## 2019-07-25 DIAGNOSIS — I10 Essential (primary) hypertension: Secondary | ICD-10-CM | POA: Diagnosis not present

## 2019-07-25 DIAGNOSIS — E118 Type 2 diabetes mellitus with unspecified complications: Secondary | ICD-10-CM

## 2019-07-25 MED ORDER — BASAGLAR KWIKPEN 100 UNIT/ML ~~LOC~~ SOPN: PEN_INJECTOR | SUBCUTANEOUS | 6 refills | Status: DC

## 2019-07-25 MED ORDER — SITAGLIPTIN PHOSPHATE 25 MG PO TABS
25.0000 mg | ORAL_TABLET | Freq: Every day | ORAL | 3 refills | Status: DC
Start: 2019-07-25 — End: 2019-07-25

## 2019-07-25 MED ORDER — EMPAGLIFLOZIN 10 MG PO TABS
10.0000 mg | ORAL_TABLET | Freq: Every day | ORAL | 3 refills | Status: DC
Start: 2019-07-25 — End: 2019-11-21

## 2019-07-25 NOTE — Patient Instructions (Signed)
It was great to see you today! Thank you for letting me participate in your care!  Today, we discussed your diabetes and I am rechecking your kidney function today to ensure you are still safe to continue taking your current medications. If anything is abnormal I will call you.  I have started you on a new medication called Jardiance. Please take it as directed.  Be well, Harolyn Rutherford, DO PGY-3, [facility]

## 2019-07-25 NOTE — Progress Notes (Signed)
    SUBJECTIVE:   CHIEF COMPLAINT / HPI:   T2DM Follow Up Patient returns to clinic at my request as she had abormal eGFR on her last lab check up. I wanted to go over her medications with her for her diabetes and discuss the potential to need to adjust them and to discuss starting her on another medication as her diabetes remains poorly controlled. I discussed with [name] the importance of controlling her blood sugar and discussed the health risks of not doing so include but are not limited to increased risk of stroke, heart attack, kidney failure, amputation, loss of vision, and neuropathy. She expressed understanding and let me know she will be taking her medications as prescribed. She was grateful for the education and discussion.  I did spend time encouraging her to make the effort to watch her diet and to continue to try and exercise. I believe her anxiety and depression makes it hard for her to push through and make the needed changes to better control her blood sugar.  PERTINENT  PMH / PSH: Anxiety, Depression, OA of both knees, HLD  OBJECTIVE:   BP (!) 146/80   Pulse 91   Ht '5\' 11"'$  (1.803 m)   Wt (!) 405 lb 3.2 oz (183.8 kg)   SpO2 97%   BMI 56.51 kg/m   Gen: NAD Cardio: RRR, no murmurs  ASSESSMENT/PLAN:   Type II diabetes mellitus with complication (HCC) Starting Jardiance '10mg'$  daily, will most likely need to increase to '25mg'$  daily. Given her kidney function fluctuates but gets close to but not consistently below 45 I will continue Metformin at '1000mg'$  BID HOWEVER this will need to be continually monitored closely to ensure we adjust down the dose if her eGFR decreases below [place], [place]

## 2019-07-26 LAB — BASIC METABOLIC PANEL
BUN/Creatinine Ratio: 20 (ref 9–23)
BUN: 27 mg/dL — ABNORMAL HIGH (ref 6–24)
CO2: 23 mmol/L (ref 20–29)
Calcium: 9 mg/dL (ref 8.7–10.2)
Chloride: 101 mmol/L (ref 96–106)
Creatinine, Ser: 1.32 mg/dL — ABNORMAL HIGH (ref 0.57–1.00)
GFR calc Af Amer: 54 mL/min/{1.73_m2} — ABNORMAL LOW (ref >59)
GFR calc non Af Amer: 47 mL/min/{1.73_m2} — ABNORMAL LOW (ref >59)
Glucose: 258 mg/dL — ABNORMAL HIGH (ref 65–99)
Potassium: 5.1 mmol/L (ref 3.5–5.2)
Sodium: 136 mmol/L (ref 134–144)

## 2019-07-28 NOTE — Assessment & Plan Note (Signed)
Starting Jardiance 59m daily, will most likely need to increase to 278mdaily. Given her kidney function fluctuates but gets close to but not consistently below 45 I will continue Metformin at 100064mID HOWEVER this will need to be continually monitored closely to ensure we adjust down the dose if her eGFR decreases below 45

## 2019-08-04 ENCOUNTER — Telehealth: Payer: Self-pay

## 2019-08-04 NOTE — Telephone Encounter (Signed)
Patient calls nurse line regarding financial issues paying for jardiance. Patient reports one month supply is going to cost $50 and she cannot afford that right now.   Patient reqeuesting more cost effective option.   To PCP  Talbot Grumbling, RN

## 2019-08-15 ENCOUNTER — Telehealth (HOSPITAL_COMMUNITY): Payer: BC Managed Care – PPO | Admitting: Psychiatry

## 2019-08-22 NOTE — Telephone Encounter (Signed)
Left a voicemail for pt 08/22/19 11:56am--obtained a Jardiance copay card that brought pt's copay down to $10.  [place] to confirm the $10 copay, they are filling the medication for her now.  Card processing info:  Bin:  [id number], PCN:  [place], ID:  [id number], GRP:  [id number]

## 2019-08-23 ENCOUNTER — Ambulatory Visit
Admission: RE | Admit: 2019-08-23 | Discharge: 2019-08-23 | Disposition: A | Discharge status: Home / Self Care | Payer: BC Managed Care – PPO | Source: Ambulatory Visit | Attending: Family Medicine | Admitting: Family Medicine

## 2019-08-23 DIAGNOSIS — M25562 Pain in left knee: Secondary | ICD-10-CM

## 2019-08-24 ENCOUNTER — Other Ambulatory Visit: Payer: Self-pay | Admitting: *Deleted

## 2019-08-24 ENCOUNTER — Other Ambulatory Visit: Payer: Self-pay

## 2019-08-24 DIAGNOSIS — F411 Generalized anxiety disorder: Secondary | ICD-10-CM

## 2019-08-24 MED ORDER — MELOXICAM 15 MG PO TABS: ORAL_TABLET | ORAL | 0 refills | Status: DC

## 2019-08-24 MED ORDER — LORAZEPAM 1 MG PO TABS: ORAL_TABLET | ORAL | 0 refills | Status: DC

## 2019-08-25 ENCOUNTER — Other Ambulatory Visit: Payer: Self-pay | Admitting: Family Medicine

## 2019-08-25 ENCOUNTER — Ambulatory Visit: Payer: BC Managed Care – PPO | Admitting: Family Medicine

## 2019-08-25 DIAGNOSIS — F411 Generalized anxiety disorder: Secondary | ICD-10-CM

## 2019-09-05 ENCOUNTER — Ambulatory Visit (HOSPITAL_COMMUNITY): Payer: BC Managed Care – PPO | Admitting: Licensed Clinical Social Worker

## 2019-09-22 ENCOUNTER — Ambulatory Visit (INDEPENDENT_AMBULATORY_CARE_PROVIDER_SITE_OTHER): Payer: BC Managed Care – PPO | Admitting: Psychiatry

## 2019-09-22 ENCOUNTER — Encounter (HOSPITAL_COMMUNITY): Payer: Self-pay | Admitting: Psychiatry

## 2019-09-22 ENCOUNTER — Other Ambulatory Visit: Payer: Self-pay

## 2019-09-22 DIAGNOSIS — F3130 Bipolar disorder, current episode depressed, mild or moderate severity, unspecified: Secondary | ICD-10-CM

## 2019-09-22 DIAGNOSIS — F411 Generalized anxiety disorder: Secondary | ICD-10-CM | POA: Diagnosis not present

## 2019-09-22 DIAGNOSIS — F331 Major depressive disorder, recurrent, moderate: Secondary | ICD-10-CM

## 2019-09-22 MED ORDER — HYDROXYZINE PAMOATE 25 MG PO CAPS: 25.0000 mg | ORAL_CAPSULE | Freq: Three times a day (TID) | ORAL | 1 refills | Status: DC | PRN

## 2019-09-22 MED ORDER — CARIPRAZINE HCL 6 MG PO CAPS: 6.0000 mg | ORAL_CAPSULE | Freq: Every day | ORAL | 1 refills | Status: DC

## 2019-09-22 MED ORDER — FLUOXETINE HCL 40 MG PO CAPS: 80.0000 mg | ORAL_CAPSULE | Freq: Every day | ORAL | 1 refills | Status: DC

## 2019-09-22 MED ORDER — MIRTAZAPINE 15 MG PO TABS: 15.0000 mg | ORAL_TABLET | Freq: Every day | ORAL | 1 refills | Status: DC

## 2019-09-22 MED ORDER — MELATONIN 3 MG PO CAPS: 3.0000 mg | ORAL_CAPSULE | Freq: Every evening | ORAL | 1 refills | Status: DC

## 2019-09-22 NOTE — Progress Notes (Signed)
Psychiatric Initial Adult Assessment   Patient Identification: LIZABETH FELLNER MRN:  [id number] Date of Evaluation:  09/22/2019 Referral Source: [facility] Chief Complaint:  "Change is so hard for me. I get so scared" Visit Diagnosis:    ICD-10-CM   1. Bipolar I disorder, most recent episode depressed (HCC)  F31.30 cariprazine 6 MG CAPS    Melatonin 3 MG CAPS  2. Generalized anxiety disorder  F41.1 FLUoxetine (PROZAC) 40 MG capsule    hydrOXYzine (VISTARIL) 25 MG capsule    Ambulatory referral to Social Work  3. Moderate episode of recurrent major depressive disorder (HCC)  F33.1 FLUoxetine (PROZAC) 40 MG capsule    mirtazapine (REMERON) 15 MG tablet    Ambulatory referral to Social Work    History of Present Illness: 49 year old female seen today for initial psychiatric evaluation.  She was referred to outpatient psychiatry by [facility] for medication management.  She has a psychiatric history of OCD, adjustment disorder, anxiety, depression, SI, and bipolar disorder.  She is currently being managed on Vraylar 3 mg daily, Prozac 80 mg daily, Vistaril 25 mg twice daily, and mirtazapine 15 mg at bedtime.  She notes that her medications are somewhat effective however endorses symptoms of anxiety and depression.  On exam patient is tearful and anxious.  She notes that change is very difficult for her.  She informed Probation officer that she was nervous about comeing to the facility. She notes that she feared providers would judge her or not really want to care from her.  She notes that she drove by the facility several times before coming in.  She notes that when she is anxious or in a new situation she drives by the place a week in advance and goes inside to make sure that she will be comfortable.  Patient also endorses symptoms of depression such as  Insomnia (starting 3 weeks ago), psychomotor agitation, feelings of guilt, difficulty concentrating, hopelessness, and loss of energy.  She notes that at times she  is distractible, has fluctuating moods, racing thoughts, and irritability.  She endorses obsessive compulsive behaviors noting that she frequently checks her bank account.  She also notes that she has a fascination with numbers and notes that while driving she looks at license plates to remember the numbers.  Patient informed Probation officer that she has multiple life stressors including the loss of her son.  She notes that he passed away 5 years ago in his sleep after having a seizure.  She notes that she feels like he may have been using illegal substances as well that could have contributed to his death.  She notes that her husband suffered from a back injury and is currently unable to work.  He has been denied disability and she is now the primary breadwinner for the family.  She notes that this is financially stressful and at times notes that she does not have extra money at the end of the month.  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  She reports that she enjoys work as it alleviates some of her stress.  She also notes that listening to calming music, looking at at [facility] shopping channels, and listening to a podcast are somewhat effective in managing her anxiety.  Provider encourage patient to also utilize deep breathing techniques as well as utilizing 5 senses to ground herself.   Patient is agreeable to increase Vraylar 3 mg to 6 mg daily to help with symptoms of  depression.  She is also agreeable to increasing hydroxyzine 25 mg twice daily to 25 mg 3 times daily.  She will also start melatonin 3 to 6 mg at bedtime as needed for sleep and continue all other medications as prescribed. Potential side effects of medication and risks vs benefits of treatment vs non-treatment were explained and discussed. All questions were answered.  Patient will follow up with outpatient counselor for therapy.  No other concerns noted at this time.  Associated  Signs/Symptoms: Depression Symptoms:  depressed mood, insomnia, psychomotor agitation, feelings of worthlessness/guilt, difficulty concentrating, hopelessness, anxiety, loss of energy/fatigue, (Hypo) Manic Symptoms:  Distractibility, Elevated Mood, Flight of Ideas, Irritable Mood, Anxiety Symptoms:  Excessive Worry, Obsessive Compulsive Symptoms:   Checking,, Psychotic Symptoms:  Denies PTSD Symptoms: NA  Past Psychiatric History: OCD, Adjustment disorder, Bipolar disorder, depression, insomnia, panic disorder  Previous Psychotropic Medications: Trialed Buspar (had tics), Doxepin (overdoesed in 2012).  Substance Abuse History in the last 12 months:  No.  Consequences of Substance Abuse: NA  Past Medical History:  Past Medical History:  Diagnosis Date  . Acute renal failure ([place]) 05/27/10   hemodialysis for 6 weeks  . Anxiety   . Asthma   . Depression   . Diabetes mellitus   . Hypertension   . Rhabdomyolysis 06/06/10   after drug overdose  . Suicide attempt by drug ingestion ([place]) 06/05/10   result rhabdomyolosis and ARF requrining dialysis     Past Surgical History:  Procedure Laterality Date  . ANKLE ARTHROSCOPY Right 08/17/2015   Procedure: RIGHT ANKLE ARTHROSCOPY WITH SYNOVECTOMY AND LOOSE BODY EXCISION;  Surgeon: Melrose Nakayama, MD;  Location: [facility];  Service: Orthopedics;  Laterality: Right;  . CHOLECYSTECTOMY    . TUBAL LIGATION  1998    Family Psychiatric History: Unknown  Family History:  Family History  Problem Relation Age of Onset  . Asthma Father     Social History:   Social History   Socioeconomic History  . Marital status: Married    Spouse name: Not on file  . Number of children: 2  . Years of education: Not on file  . Highest education level: Not on file  Occupational History  . Occupation: Corporate treasurer  Tobacco Use  . Smoking status: Never Smoker  . Smokeless tobacco: Never Used  Vaping Use  . Vaping Use: Never used   Substance and Sexual Activity  . Alcohol use: No  . Drug use: No  . Sexual activity: Yes    Comment: with husband   Other Topics Concern  . Not on file  Social History Narrative   Married high school boyfriend at age 73, two boys, still married but he has been living with other women for years.   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 Difficulty of Paying Living Expenses:   Food Insecurity:   . Worried About Charity fundraiser in the Last Year:   . Arboriculturist in the Last Year:   Transportation Needs:   . Film/video editor (Medical):   Marland Kitchen Lack of Transportation (Non-Medical):   Physical Activity:   . Days of Exercise per Week:   . Minutes of Exercise per Session:   Stress:   . Feeling of Stress :   Social Connections:   . Frequency of Communication with Friends  and Family:   . Frequency of Social Gatherings with Friends and Family:   . Attends Religious Services:   . Active Member of Clubs or Organizations:   . Attends Archivist Meetings:   Marland Kitchen Marital Status:     Additional Social History: Patient resides in [place] with her husband and son. She has two sons (one is deceased). 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 She denies tobacco, alcohol, or illicit drug use.   Allergies:   Allergies  Allergen Reactions  . Ace Inhibitors Other (See Comments)    Patient had acute renal failure requiring hemodialysis after a suicide attempt.  Nephro recommended avoiding use.  . Haldol [Haloperidol Lactate] Other (See Comments)    Tardive dyskinesia  . Nsaids Other (See Comments)    Nephro recommended avoiding after acute renal failure requiring hemodialysis associated with a suicide attempt.  . Latex Other (See Comments)    Metabolic Disorder Labs: Lab Results  Component Value Date   HGBA1C 9.6 (A)  07/13/2019   MPG 243.17 11/14/2017   MPG 186 05/09/2015   No results found for: PROLACTIN Lab Results  Component Value Date   CHOL 195 07/13/2019   TRIG 251 (H) 07/13/2019   HDL 52 07/13/2019   CHOLHDL 3.8 07/13/2019   VLDL 46 (H) 04/10/2015   LDLCALC 101 (H) 07/13/2019   LDLCALC 55 04/10/2015   Lab Results  Component Value Date   TSH 3.247 11/13/2017    Therapeutic Level Labs: No results found for: LITHIUM No results found for: CBMZ No results found for: VALPROATE  Current Medications: Current Outpatient Medications  Medication Sig Dispense Refill  . albuterol (VENTOLIN HFA) 108 (90 Base) MCG/ACT inhaler Inhale 2 puffs into the lungs every 6 (six) hours as needed for wheezing or shortness of breath. 18 g 3  . aspirin EC 81 MG tablet Take 81 mg by mouth daily.    Marland Kitchen atorvastatin (LIPITOR) 40 MG tablet Take 1 tablet (40 mg total) by mouth daily. 90 tablet 3  . cariprazine 6 MG CAPS Take 1 capsule (6 mg total) by mouth daily. 30 capsule 1  . empagliflozin (JARDIANCE) 10 MG TABS tablet Take 10 mg by mouth daily. 90 tablet 3  . ferrous sulfate 325 (65 FE) MG tablet Take 1 tablet (325 mg total) by mouth 3 (three) times daily with meals. (Patient taking differently: Take 325 mg by mouth daily. Take every other day with stool softer) 90 tablet 3  . FLUoxetine (PROZAC) 40 MG capsule Take 2 capsules (80 mg total) by mouth daily. 60 capsule 1  . hydrOXYzine (VISTARIL) 25 MG capsule Take 1 capsule (25 mg total) by mouth 3 (three) times daily as needed for itching. 90 capsule 1  . Insulin Glargine (BASAGLAR KWIKPEN) 100 UNIT/ML Take 50U at bedtime daily 15 mL 6  . ipratropium-albuterol (DUONEB) 0.5-2.5 (3) MG/3ML SOLN Take 3 mLs by nebulization 3 (three) times daily as needed. 360 mL 3  . levothyroxine (SYNTHROID) 300 MCG tablet Take 1 tablet (300 mcg total) by mouth daily. 90 tablet 3  . lisinopril (ZESTRIL) 5 MG tablet Take 1 tablet (5 mg total) by mouth daily. 30 tablet 0  . LORazepam  (ATIVAN) 1 MG tablet TAKE 1 TABLET BY MOUTH EVERY 8 HOURS AS NEEDED FOR ANXIETY **DO  NOT  FILL  LESS  THAN  30  DAYS  FROM  LAST  REFILL** 45 tablet 0  . Melatonin 3 MG CAPS Take 1 capsule (3  mg total) by mouth at bedtime. 60 capsule 1  . meloxicam (MOBIC) 15 MG tablet TAKE 1 TABLET BY MOUTH ONCE DAILY WITH MEALS 15 tablet 0  . metFORMIN (GLUCOPHAGE) 1000 MG tablet Take 1 tablet (1,000 mg total) by mouth 2 (two) times daily with a meal. 60 tablet 2  . metoprolol succinate (TOPROL-XL) 25 MG 24 hr tablet Take 1 tablet (25 mg total) by mouth daily. 30 tablet 0  . mirtazapine (REMERON) 15 MG tablet Take 1 tablet (15 mg total) by mouth daily. 30 tablet 1  . pantoprazole (PROTONIX) 40 MG tablet Take 1 tablet (40 mg total) by mouth daily. 30 tablet 3  . Semaglutide,0.25 or 0.5MG /DOS, (OZEMPIC, 0.25 OR 0.5 MG/DOSE,) 2 MG/1.5ML SOPN Inject 1 mg into the skin once a week. 3 mL 2  . Vitamin D, Ergocalciferol, (DRISDOL) 50000 units CAPS capsule Take 1 capsule (50,000 Units total) by mouth every 7 (seven) days. Sunday 12 capsule 0   No current facility-administered medications for this visit.    Musculoskeletal: Strength & Muscle Tone: within normal limits Gait & Station: normal Patient leans: N/A  Psychiatric Specialty Exam: Review of Systems  There were no vitals taken for this visit.There is no height or weight on file to calculate BMI.  General Appearance: Well Groomed  Eye Contact:  Good  Speech:  Clear and Coherent and Normal Rate  Volume:  Normal  Mood:  Anxious and Depressed  Affect:  Congruent  Thought Process:  Coherent, Goal Directed and Linear  Orientation:  Full (Time, Place, and Person)  Thought Content:  WDL and Logical  Suicidal Thoughts:  No  Homicidal Thoughts:  No  Memory:  Immediate;   Good Recent;   Good Remote;   Good  Judgement:  Good  Insight:  Good  Psychomotor Activity:  Normal  Concentration:  Concentration: Good and Attention Span: Good  Recall:  Good  Fund of  Knowledge:Good  Language: Good  Akathisia:  No  Handed:  Right  AIMS (if indicated):  Not done  Assets:  Communication Skills Desire for Improvement Financial Resources/Insurance Housing Social Support  ADL's:  Intact  Cognition: WNL  Sleep:  Fair   Screenings: PHQ2-9     Office Visit from 07/25/2019 in [facility] Office Visit from 07/13/2019 in [facility] Office Visit from 03/29/2019 in [facility] Office Visit from 03/14/2019 in [facility] Office Visit from 08/24/2018 in [facility]  PHQ-2 Total Score 2 2 0 4 0  PHQ-9 Total Score 7 8 -- -- --      Assessment and Plan: Patient endorses symptoms of anxiety, depression, and insomnia.  She is agreeable to increasing Vraylar 3 mg to 6 mg daily to help with symptoms of depression.  She is also agreeable to increasing hydroxyzine 25 mg twice daily to 25 mg 3 times daily.  She will also start melatonin 3 to 6 mg at bedtime as needed for sleep and continue all other medications as prescribed.  1. Bipolar I disorder, most recent episode depressed (HCC)  Increased- cariprazine 6 MG CAPS; Take 1 capsule (6 mg total) by mouth daily.  Dispense: 30 capsule; Refill: 1 Start- Melatonin 3 MG CAPS; Take 1 capsule (3 mg total) by mouth at bedtime.  Dispense: 60 capsule; Refill: 1  2. Generalized anxiety disorder  Continue- FLUoxetine (PROZAC) 40 MG capsule; Take 2 capsules (80 mg total) by mouth daily.  Dispense: 60  capsule; Refill: 1 Increased- hydrOXYzine (VISTARIL) 25 MG capsule; Take 1 capsule (25 mg total) by mouth 3 (three) times daily as needed for itching.  Dispense: 90 capsule; Refill: 1 - Ambulatory referral to Social Work  3. Moderate episode of recurrent major depressive disorder (HCC)  Continue- FLUoxetine (PROZAC) 40 MG capsule; Take 2 capsules (80 mg total) by mouth daily.  Dispense: 60 capsule; Refill: 1 Continue- mirtazapine  (REMERON) 15 MG tablet; Take 1 tablet (15 mg total) by mouth daily.  Dispense: 30 tablet; Refill: 1 - Ambulatory referral to Social Work  Follow-up in 1 month Follow-up with therapy   Salley Slaughter, NP 7/15/20213:04 PM

## 2019-09-26 ENCOUNTER — Telehealth: Payer: Self-pay

## 2019-09-26 ENCOUNTER — Other Ambulatory Visit: Payer: Self-pay | Admitting: Family Medicine

## 2019-09-26 DIAGNOSIS — F411 Generalized anxiety disorder: Secondary | ICD-10-CM

## 2019-09-26 NOTE — Telephone Encounter (Signed)
Patient calls nurse line reporting she saw on TV where you can obtain 3 months worth of Ozempic for the price of one month, 25 dollars. Patient reports she asked her pharmacy about this and they states she would have to obtain "the card from [name]." Unsure if this is something Valentina Lucks has or knows about. Will forward.

## 2019-09-27 NOTE — Telephone Encounter (Signed)
Ozempic savings card for pharmacy billing:  BIN:  [id number] PCN:  [id number] GRP:  [id number] ID:  [id number]  Faxed copy to [facility] on [address] and emailed copy to pt at [email or web address].  Copay card allows for $25 for 1-3 months(max benefit of $150/1 mth or $450/3 mth).

## 2019-09-30 ENCOUNTER — Encounter: Payer: Self-pay | Admitting: Family Medicine

## 2019-09-30 ENCOUNTER — Ambulatory Visit (INDEPENDENT_AMBULATORY_CARE_PROVIDER_SITE_OTHER): Payer: BC Managed Care – PPO | Admitting: Family Medicine

## 2019-09-30 ENCOUNTER — Telehealth: Payer: Self-pay | Admitting: Family Medicine

## 2019-09-30 ENCOUNTER — Other Ambulatory Visit: Payer: Self-pay

## 2019-09-30 VITALS — BP 132/80 | HR 92 | Ht 71.0 in | Wt >= 6400 oz

## 2019-09-30 DIAGNOSIS — Z791 Long term (current) use of non-steroidal anti-inflammatories (NSAID): Secondary | ICD-10-CM | POA: Diagnosis not present

## 2019-09-30 DIAGNOSIS — M25562 Pain in left knee: Secondary | ICD-10-CM

## 2019-09-30 DIAGNOSIS — I120 Hypertensive chronic kidney disease with stage 5 chronic kidney disease or end stage renal disease: Secondary | ICD-10-CM | POA: Diagnosis not present

## 2019-09-30 DIAGNOSIS — M25561 Pain in right knee: Secondary | ICD-10-CM | POA: Diagnosis not present

## 2019-09-30 DIAGNOSIS — N183 Chronic kidney disease, stage 3 unspecified: Secondary | ICD-10-CM

## 2019-09-30 DIAGNOSIS — N185 Chronic kidney disease, stage 5: Secondary | ICD-10-CM

## 2019-09-30 DIAGNOSIS — M159 Polyosteoarthritis, unspecified: Secondary | ICD-10-CM

## 2019-09-30 DIAGNOSIS — G8929 Other chronic pain: Secondary | ICD-10-CM

## 2019-09-30 MED ORDER — NAPROXEN 500 MG PO TABS: 500.0000 mg | ORAL_TABLET | Freq: Two times a day (BID) | ORAL | 0 refills | Status: DC

## 2019-09-30 MED ORDER — NAPROXEN 500 MG PO TABS: 250.0000 mg | ORAL_TABLET | Freq: Two times a day (BID) | ORAL | 0 refills | Status: DC

## 2019-09-30 NOTE — Patient Instructions (Addendum)
It was great seeing you today!   I'd like to see you back ASAP for knee injection but if you need to be seen earlier than that for any new issues we're happy to fit you in, just give [name] a call!  - Take Tylenol 650 mg three times a day - Stop by the pharmacy to pick up you new medication. Do not take ibuprofen, Alleve, BC powder and other NSAIDs while taking this medication  - Take Hydroxyzine at bedtime to help you sleep  - Make an appointment for to an knee injection on your way out.  - You will receive a call regarding your sports medicine referral with Dr. Garlan Fillers      If you have questions or concerns please do not hesitate to call at [phone].  Dr. Rushie [facility]

## 2019-09-30 NOTE — Progress Notes (Signed)
   SUBJECTIVE:   CHIEF COMPLAINT / HPI:   Chief Complaint  Patient presents with  . Knee Pain     [name] is a 49 y.o. female here for lateral knee pain.  States left knee is worse than right.  Had x-rays back in June.  Reports relief with Aleve. Has tried Voltaren gel without relief.  Ice helps more than heat.  Biofreeze helps quit working.  Feels as if " my knee is going to give out on me" and states " it is going to buckle and I have to hold onto things."'  Denies recent falls, trauma or previous injury.  Has gotten knee injections and diclofenac in the past with some relief.    PERTINENT  PMH / PSH: OA, CKD, obesity, chronic knee pain, reviewed and updated as appropriate   OBJECTIVE:   BP (!) 132/80   Pulse 92   Ht 5\' 11"  (1.803 m)   Wt (!) 404 lb (183.3 kg)   SpO2 99%   BMI 56.35 kg/m    GEN: well appearing obese female in no acute distress  CVS: well perfused, equal 2+ DP pulses RESP: speaking in full sentences without pause  Knee Exam:  -Inspection: no deformity, no discoloration, erythema, bruising, effusion, warmth no palpable masses -Palpation: Medial tenderness -ROM: Extension limited secondary to pain, normal flexion -Quadricep strength 5/5 bilateral lower extremities -Special Tests: ACL, PCL, LCL, MCL ligaments with solid and consistent endpoints.  Anterior drawer: Negative; Posterior drawer: Negative; McMurray: Negative -Limb neurovascularly intact, no instability noted    ASSESSMENT/PLAN:   Bilateral knee pain Uncontrolled.  Recent (08/23/2019) x-rays reviewed and notable for tricompartment arthritis with knee effusion with probable suprapatellar loose body.  -Discussed need for continued weight loss.  Suggested swimming classes as this would be better on her joints.  Patient reports she has done this in the past and enjoyed swimming. -Referral to sports medicine placed.  Patient would like to be seen by Dr. Garlan Fillers. -Follow-up as soon as possible  with [facility] ED for left knee joint injection. -She may need surgery in her future -Would likely benefit from physical therapy -RICE conservative measures -Tylenol 650mg  3 times daily -Short course naproxen sodium (250 mg BID for 5-7 days) until patient can make a follow-up appointment.  Will need to monitor kidney function closely.  Advised patient to not take any over-the-counter NSAIDs.     Lyndee Hensen, DO PGY-2, [facility] 09/30/2019

## 2019-09-30 NOTE — Assessment & Plan Note (Addendum)
Uncontrolled.  Recent (08/23/2019) x-rays reviewed and notable for tricompartment arthritis with knee effusion with probable suprapatellar loose body.  -Discussed need for continued weight loss.  Suggested swimming classes as this would be better on her joints.  Patient reports she has done this in the past and enjoyed swimming. -Referral to sports medicine placed.  Patient would like to be seen by Dr. Garlan Fillers. -Follow-up as soon as possible with [facility] ED for left knee joint injection. -She may need surgery in her future -Would likely benefit from physical therapy -RICE conservative measures -Tylenol 650mg  3 times daily -Short course naproxen sodium (250 mg BID for 5-7 days) until patient can make a follow-up appointment.  Will need to monitor kidney function closely.  Advised patient to not take any over-the-counter NSAIDs.

## 2019-10-05 NOTE — Telephone Encounter (Signed)
Patient calls nurse line requesting information on obtaining savings card. I advised patient a copy was sent to her pharmacy and to her email. Patient never received email, as her email is [email or web address]. BIN, PCN, GRP, ID all given to patient.  Can we send her another email with this information as well. Thanks!

## 2019-10-14 ENCOUNTER — Other Ambulatory Visit: Payer: Self-pay

## 2019-10-14 ENCOUNTER — Ambulatory Visit (INDEPENDENT_AMBULATORY_CARE_PROVIDER_SITE_OTHER): Payer: BC Managed Care – PPO | Admitting: Family Medicine

## 2019-10-14 VITALS — BP 140/60 | HR 100 | Ht 71.0 in | Wt >= 6400 oz

## 2019-10-14 DIAGNOSIS — N946 Dysmenorrhea, unspecified: Secondary | ICD-10-CM | POA: Diagnosis not present

## 2019-10-14 DIAGNOSIS — E118 Type 2 diabetes mellitus with unspecified complications: Secondary | ICD-10-CM

## 2019-10-14 DIAGNOSIS — R5383 Other fatigue: Secondary | ICD-10-CM | POA: Diagnosis not present

## 2019-10-14 DIAGNOSIS — D509 Iron deficiency anemia, unspecified: Secondary | ICD-10-CM

## 2019-10-14 LAB — POCT HEMOGLOBIN: Hemoglobin: 12 g/dL (ref 11–14.6)

## 2019-10-14 NOTE — Assessment & Plan Note (Signed)
We only briefly discussed this. Patient with history of iron-deficiency anemia so POC Hgb ordered which came back at 12. Patient has only been taking one iron supplement daily. Advised taking the prescribed three daily with stool softener to help with constipation, she was agreeable. Further mentioned the various possibilities that could cause fatigue including dehydration. Patient endorses not drinking much water. Advised patient to make appointment if this worsens as we will need to do further evaluation.

## 2019-10-14 NOTE — Progress Notes (Signed)
21urin

## 2019-10-14 NOTE — Progress Notes (Signed)
SUBJECTIVE:   CHIEF COMPLAINT / HPI: Left knee pain  Left knee pain Patient with chronic knee pain. Is concerned because she is starting work on Tuesday. 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 States that the pain makes her cry almost three times daily. She has had some relief with steroid injections in the past and is hoping for another today so that she can feel some improvement. Patient tells me she lives on the third story as well. She has tried ice and a knee brace which typically help. [facility]. She has been trying to inquire about their pricing as she is under financial stress being the sole earner in her household.    Type 2 Diabetes  Patient notes that Vania Rea makes her pee a lot and she is wondering if that is normal. Is also wondering about the discount information for her Ozempic.  Fatigue Patient has history of iron deficiency anemia requiring infusions and transfusions. She tells me that she is concerned about her iron levels and has noticed increased fatigue. She has only been taking her iron supplements once daily instead of the prescribed three times a day. This is because it constipates her. Patient used to take it with a stool softener but states she ran out. She feels she would be able to afford more stool softeners.   PERTINENT  PMH / PSH: Past Medical History: 05/27/10: Acute renal failure (HCC)     Comment:  hemodialysis for 6 weeks No date: Anxiety No date: Asthma No date: Depression No date: Diabetes mellitus No date: Hypertension 06/06/10: Rhabdomyolysis     Comment:  after drug overdose 06/05/10: Suicide attempt by drug ingestion ([place])     Comment:  result rhabdomyolosis and ARF requrining dialysis    OBJECTIVE:   BP 140/60   Pulse 100   Ht 5\' 11"  (1.803 m)   Wt (!) 403 lb 6 oz (183 kg)   LMP 09/30/2019   SpO2 97%   BMI 56.26 kg/m    Physical Exam Constitutional:      General: She is in acute distress.     Appearance: She is obese.  HENT:     Head: Normocephalic.  Cardiovascular:     Rate and Rhythm: Normal rate and regular rhythm.     Heart sounds: Normal heart sounds.  Musculoskeletal:     Cervical back: Neck supple.     Comments: Left Knee: pain to palpation medial joint line and over patella. Patient with full active ROM but with pain in doing so. Negative bulge sign. Negative anterior and posterior drawer.   Neurological:     Mental Status: She is alert.  Psychiatric:     Comments: Tearful, anxious      ASSESSMENT/PLAN:   Osteoarthritis Patient presents with chronic left knee pain. Has tricompartmental arthritis. Dr. McDiarmid performed knee injection today. I also educated patient on the importance of weight loss to prevent worsening arthritis. We discussed exercising in the pool, getting a cane to assist with weight bearing and continuing to use ice and the knee brace. We discussed the possibility of surgery if patient is able to successfully lose weight.   Type II diabetes mellitus with complication (HCC) Increased Jardiance to 25mg  per last note. Will plan to repeat A1c on follow up visit. Educated patient on importance of healthy diet and exercise.  Additionally, I added Ozempic discount information to her AVS.  Fatigue We only briefly discussed this. Patient with history of iron-deficiency anemia so POC Hgb ordered which came back at 12. Patient has only been taking one iron supplement daily. Advised taking the prescribed three daily with stool softener to help with constipation, she was agreeable. Further mentioned the various possibilities that could cause fatigue including dehydration. Patient endorses not drinking much water. Advised patient to make appointment if this worsens as we will need to do further evaluation.      [name], [place]

## 2019-10-14 NOTE — Patient Instructions (Addendum)
It was wonderful to meet you today.   Please bring ALL of your medications with you to every visit.   Today we talked about:  1. Ways to help with your knee. [facility], I believe this will help. Additionally, please try to work on your diet. These two things will help with weight loss which is very important in keeping your osteoarthritis from worsening. You can also buy a cane from a local drug store. I believe this will also help. Continue to use ice as well as your brace.  2. We briefly discussed your diabetes medications and going up on your Jardiance to 25. Please see below for your ozempic savings card information for the pharmacy.  3. I also ordered a test to check your hemoglobin. Please take your iron supplements every day in addition to a stool softener.   Thank you for choosing [facility].   Please call [phone] with any questions about today's appointment.  Please be sure to schedule follow up at the front  desk before you leave today.   [name], DO PGY-1 Family Medicine     Ozempic savings card for pharmacy billing:  BIN:  [id number] PCN:  [id number] GRP:  [id number] ID:  [id number]  Faxed copy to [facility] on [address] and emailed copy to pt at [email or web address].  Copay card allows for $25 for 1-3 months(max benefit of $150/1 mth or $450/3 mth).

## 2019-10-14 NOTE — Assessment & Plan Note (Signed)
Increased Jardiance to 25mg  per last note. Will plan to repeat A1c on follow up visit. Educated patient on importance of healthy diet and exercise. Additionally, I added Ozempic discount information to her AVS.

## 2019-10-14 NOTE — Assessment & Plan Note (Signed)
Patient presents with chronic left knee pain. Has tricompartmental arthritis. Dr. McDiarmid performed knee injection today. I also educated patient on the importance of weight loss to prevent worsening arthritis. We discussed exercising in the pool, getting a cane to assist with weight bearing and continuing to use ice and the knee brace. We discussed the possibility of surgery if patient is able to successfully lose weight.

## 2019-10-17 NOTE — Progress Notes (Signed)
Procedure Diagnosis: Left knee osteoarthritis Operator: Sherren Mocha Rexann Lueras, MD Procedure: Intra-articular injection of left knee (Major joint) Consent: Obtained Site: Left medial knee Prep: Betadine x 3 followed by alcohol swab x 1 Material: 0.5 mL of Solumedrol 125 mg/mL Instruments:  25 gauge 1.5 in needle Actions: medial knee joint approach intra-articular injection. Adhesive bandage applied to injection site. No blood loss.  Complications: none

## 2019-10-27 ENCOUNTER — Other Ambulatory Visit: Payer: Self-pay | Admitting: Family Medicine

## 2019-10-27 DIAGNOSIS — F411 Generalized anxiety disorder: Secondary | ICD-10-CM

## 2019-11-03 ENCOUNTER — Encounter (HOSPITAL_COMMUNITY): Payer: BC Managed Care – PPO | Admitting: Psychiatry

## 2019-11-07 ENCOUNTER — Ambulatory Visit (HOSPITAL_COMMUNITY): Payer: BC Managed Care – PPO | Admitting: Licensed Clinical Social Worker

## 2019-11-17 ENCOUNTER — Other Ambulatory Visit: Payer: Self-pay

## 2019-11-17 ENCOUNTER — Ambulatory Visit: Payer: BC Managed Care – PPO | Admitting: Family Medicine

## 2019-11-17 ENCOUNTER — Ambulatory Visit (INDEPENDENT_AMBULATORY_CARE_PROVIDER_SITE_OTHER): Payer: BC Managed Care – PPO | Admitting: Sports Medicine

## 2019-11-17 VITALS — BP 153/89 | Ht 71.0 in | Wt >= 6400 oz

## 2019-11-17 DIAGNOSIS — M159 Polyosteoarthritis, unspecified: Secondary | ICD-10-CM | POA: Diagnosis not present

## 2019-11-17 DIAGNOSIS — M1712 Unilateral primary osteoarthritis, left knee: Secondary | ICD-10-CM

## 2019-11-17 DIAGNOSIS — M25571 Pain in right ankle and joints of right foot: Secondary | ICD-10-CM

## 2019-11-17 DIAGNOSIS — G8929 Other chronic pain: Secondary | ICD-10-CM | POA: Diagnosis not present

## 2019-11-17 MED ORDER — NAPROXEN 500 MG PO TABS: 500.0000 mg | ORAL_TABLET | Freq: Two times a day (BID) | ORAL | 0 refills | Status: DC

## 2019-11-17 NOTE — Patient Instructions (Signed)
-  Go get x-rays of your ankle and knee when you leave here today. -We will call you with the results. -We refilled your naproxen. -Start Physical Therapy for your ankle and knee. They should call you to schedule but you can reach out to them as well.

## 2019-11-18 ENCOUNTER — Ambulatory Visit
Admission: RE | Admit: 2019-11-18 | Discharge: 2019-11-18 | Disposition: A | Discharge status: Home / Self Care | Payer: BC Managed Care – PPO | Source: Ambulatory Visit | Attending: Sports Medicine | Admitting: Sports Medicine

## 2019-11-18 DIAGNOSIS — M1712 Unilateral primary osteoarthritis, left knee: Secondary | ICD-10-CM

## 2019-11-18 DIAGNOSIS — G8929 Other chronic pain: Secondary | ICD-10-CM

## 2019-11-18 DIAGNOSIS — M159 Polyosteoarthritis, unspecified: Secondary | ICD-10-CM

## 2019-11-18 NOTE — Progress Notes (Signed)
   Subjective:    Patient ID: [name], female    DOB: May 24, 1970, 49 y.o.   MRN: [id number]  HPI chief complaint: Right ankle and left knee pain  Very pleasant 49 year old female comes in today complaining of chronic right ankle pain.  She slipped in some water in 2015 injuring her ankle.  She was treated at [facility]  Initially her symptoms were treated conservatively but she eventually underwent an ankle arthroscopy.  She also completed physical therapy and was provided with a med spec brace.  Since that time she has had pain and swelling diffusely around the ankle, particularly on the anterior and lateral aspect of the ankle.  It has worsened recently.  X-rays of her right ankle in 2020 showed some mild degenerative changes.  X-rays of her right foot done more recently showed significant degenerative changes of the midfoot with a large osteophyte formation at the talonavicular joint.  Moderate degenerative changes of the subtalar joint as well.  She does take naproxen as needed and it does seem to help.  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  She is also complaining of left knee pain.  Pain is diffuse throughout the knee.  She has been told she has arthritis in this knee.  An x-ray of the left knee done in June of this year showed tricompartmental arthritis with a possible suprapatellar loose body.  A recent cortisone injection has helped her pain.    Review of Systems Above    Objective:   Physical Exam  Obese.  No acute distress.  Left knee: Exam is limited by body habitus.  She has range of motion from 0 to 100 degrees.  No obvious effusion.  Knee is grossly stable to ligamentous exam.  Right ankle: Patient has good range of motion.  She does have a small joint effusion.  Also soft tissue swelling along the anterior lateral ankle.  She is tender to palpation diffusely along the anterior lateral ankle.   Negative talar tilt, negative anterior drawer.  No tenderness along the medial malleolus.  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  She is able to fire her posterior tibialis tendon when standing on her tiptoes resulting in normal calcaneal inversion.  Good pulses.      Assessment & Plan:   Chronic left ankle pain likely secondary to DJD Left knee pain secondary to DJD Pes planus  I would like to get updated x-rays.  She may use either a compression sleeve or her med spec brace.  I will refill her naproxen sodium with instructions to take 500 mg twice daily with food as needed for pain.  I would also like to refer the patient for some physical therapy.  I will call her with her x-ray results when available.  I may consider further diagnostic imaging of the right ankle depending on those results.  I also think we should consider custom orthotics at some point for her significant pes planus

## 2019-11-21 ENCOUNTER — Telehealth: Payer: Self-pay

## 2019-11-21 ENCOUNTER — Encounter: Payer: Self-pay | Admitting: Family Medicine

## 2019-11-21 ENCOUNTER — Other Ambulatory Visit: Payer: Self-pay

## 2019-11-21 ENCOUNTER — Ambulatory Visit (INDEPENDENT_AMBULATORY_CARE_PROVIDER_SITE_OTHER): Payer: BC Managed Care – PPO | Admitting: Family Medicine

## 2019-11-21 VITALS — BP 142/78 | HR 94 | Ht 71.0 in | Wt >= 6400 oz

## 2019-11-21 DIAGNOSIS — E118 Type 2 diabetes mellitus with unspecified complications: Secondary | ICD-10-CM

## 2019-11-21 DIAGNOSIS — Z23 Encounter for immunization: Secondary | ICD-10-CM

## 2019-11-21 DIAGNOSIS — N921 Excessive and frequent menstruation with irregular cycle: Secondary | ICD-10-CM | POA: Diagnosis not present

## 2019-11-21 DIAGNOSIS — N946 Dysmenorrhea, unspecified: Secondary | ICD-10-CM | POA: Diagnosis not present

## 2019-11-21 DIAGNOSIS — F329 Major depressive disorder, single episode, unspecified: Secondary | ICD-10-CM

## 2019-11-21 DIAGNOSIS — N920 Excessive and frequent menstruation with regular cycle: Secondary | ICD-10-CM

## 2019-11-21 DIAGNOSIS — F32A Depression, unspecified: Secondary | ICD-10-CM

## 2019-11-21 DIAGNOSIS — M1711 Unilateral primary osteoarthritis, right knee: Secondary | ICD-10-CM

## 2019-11-21 DIAGNOSIS — D509 Iron deficiency anemia, unspecified: Secondary | ICD-10-CM

## 2019-11-21 LAB — POCT GLYCOSYLATED HEMOGLOBIN (HGB A1C): HbA1c, POC (controlled diabetic range): 11.7 % — AB (ref 0.0–7.0)

## 2019-11-21 MED ORDER — EMPAGLIFLOZIN 25 MG PO TABS
25.0000 mg | ORAL_TABLET | Freq: Every day | ORAL | 3 refills | Status: DC
Start: 2019-11-21 — End: 2020-06-21

## 2019-11-21 MED ORDER — BASAGLAR KWIKPEN 100 UNIT/ML ~~LOC~~ SOPN
PEN_INJECTOR | SUBCUTANEOUS | 6 refills | Status: DC
Start: 2019-11-21 — End: 2020-06-21

## 2019-11-21 NOTE — Progress Notes (Signed)
Elevated PHQ-9. Patient denies passive thoughts of self harm. Currently has no plans. Provider made aware.   Talbot Grumbling, RN

## 2019-11-21 NOTE — Progress Notes (Signed)
SUBJECTIVE:   CHIEF COMPLAINT / HPI:   T2DM Follow Up Patient presents for Hgb A1c and diabetes follow up. States she was unable to refill her Ozemic despite the coupon. Has been taking 60U of her Glargine recently because her sugars have been in the 300-400's and she was told to go up to 60U when they are high. She is taking 10mg  of Jardiance daily. She does not check her BGL's often because they are usually around 300's-400's and that causes her to feel anxious.   Menorrhagia Patient tells me that she had a very heavy period from Aug 7- Sept 11th. Was passing large clots with this and requiring to use the largest pads available. Patient notes history of endometrial ablation in past as well as IV iron infusions and transfusions. Has been feeling more lightheaded, dizzy and short of breath recently.   Osteoarthritis Patient seen at Sports Medicine clinic last week. Had ultrasounds done of left knee and right ankle and referred to Physical Therapy. She wants to know the results of the x-rays. She has not been using her cane. She has a hard time walking due to the arthritis which is making it difficult to work. [facility].   Depression Patient states she has a lot on her "platter". She gets her medications from the behavioral health clinic. She was advised to see a talk therapist as well but patient tells me she cancelled her last two appointments. She did not think it would help. She now feels more willing to try and has rescheduled for early November. No SI/HI. She feels she has good support, though does not want to bother people with her "sob story".    PERTINENT  PMH / PSH:  Past Medical History:  Diagnosis Date  . Acute renal failure ([place]) 05/27/10   hemodialysis for 6 weeks  . Anxiety   . Asthma   . Depression   . Diabetes mellitus   . Hypertension   . Rhabdomyolysis 06/06/10   after drug overdose  . Suicide attempt by drug ingestion  ([place]) 06/05/10   result rhabdomyolosis and ARF requrining dialysis      OBJECTIVE:   BP (!) 142/78   Pulse 94   Ht 5\' 11"  (1.803 m)   Wt (!) 184.1 kg   LMP 10/15/2019   SpO2 98%   BMI 56.60 kg/m   Gen: Obese, tearful, pleasant Heart: Distant heart sounds, difficult to appreciate due to body habitus Respiratory: CTAB  Extremities: 1+ pitting edema to knees b/l lower extremities. Right ankle brace on   ASSESSMENT/PLAN:   Type II diabetes mellitus with complication (HCC) Hgb I9C increased to 11.7 from 9.6. Plan to increase Jardiance to 25mg  and Glargine to 35U twice daily. I would like patient to meet with Dr. Valentina Lucks as well to discuss her multiple diabetes medications and look for financially affordable options since patient is unable to take Ozempic. We talked in length about the importance of weight loss and exercise and how it could improve both her physical and mental health. Follow up in 1 month to discuss BG changes with medication adjustments.   MENORRHAGIA Patient with recent 5-week episode of heavy bleeding. In the setting of iron-deficiency anemia, hx blood transfusions an iron infusions I ordered CBC and ferritin today. She does take iron supplements daily, though has been feeling more lightheaded, dizzy and short of breath. Referral also placed to GYN for further  evaluation of irregular periods and menorrhagia.   Osteoarthritis Patient with recent x-rays with no significant change from last. Received a knee injection last month. Discussed importance of weight loss as this is likely the root of the problem. Patient agreeable to try and swim. Also advised that patient use cane to help with weight bearing. Bariatric surgery would help but patient has not shown to have decline in weight. Referral has been placed with physical therapy as well by sports medicine office.   Depression Patient tearful on exam today. Score of 15 on PHQ-9. No SI/HI. Encouraged patient to not cancel  appointment with therapist in November. She is also followed up with behavioral health clinic. She has no plan to harm herself and states she would never harm anyone else.      [name], [place]

## 2019-11-21 NOTE — Assessment & Plan Note (Signed)
>>  ASSESSMENT AND PLAN FOR MDD (MAJOR DEPRESSIVE DISORDER) WRITTEN ON 11/21/2019  6:19 PM BY Sabino Dick, DO  Patient tearful on exam today. Score of 15 on PHQ-9. No SI/HI. Encouraged patient to not cancel appointment with therapist in November. She is also followed up with behavioral health clinic. She has no plan to harm herself and states she would never harm anyone else.

## 2019-11-21 NOTE — Assessment & Plan Note (Signed)
Patient with recent 5-week episode of heavy bleeding. In the setting of iron-deficiency anemia, hx blood transfusions an iron infusions I ordered CBC and ferritin today. She does take iron supplements daily, though has been feeling more lightheaded, dizzy and short of breath. Referral also placed to GYN for further evaluation of irregular periods and menorrhagia.

## 2019-11-21 NOTE — Patient Instructions (Addendum)
It was wonderful to see you today.  Please bring ALL of your medications with you to every visit.   Today we talked about:  Your diabetes. Your Hemoglobin A1c is up to 11.7 from 9.6. We need to work together to get your diabetes under better control. We will increase your Jardiance to 25mg  and I would like for you to increase your Glargine to 35 units twice daily for a total of 70 units daily. Please know that exercise and better diet control will also be essential in helping [name] to get better control. Since you have pain from arthritis I do think that swimming would be the best option.   Work with the physical therapist to help with exercises for your knee and ankle. Please use your cane to help with weight bearing. The biggest and most important thing to help your arthritis not get worse is weight loss. This is very important, not only for your arthritis but for your diabetes and overall health. I believe that you can do this. You can also schedule an appointment with our Nutritionist, Dr. Jenne Campus as well.  You received your flu vaccine today.   We checked blood work to check your blood and iron levels. I will call you or send you a letter with the result.   Please do not cancel your appointment with your therapist, I think it would be very beneficial for you. Let [name] know how we can further help you with your mental health.  I also sent in a referral to Gynecology to work up your irregular menstrual bleeding.   Thank you for choosing [facility].   Please call [phone] with any questions about today's appointment.  Please be sure to schedule follow up in 1 month. Also schedule an appointment with Dr. Valentina Lucks, our pharmacist, who can help to assist you with your diabetes management as well.   [name], DO PGY-1 Family Medicine

## 2019-11-21 NOTE — Assessment & Plan Note (Signed)
Hgb A1c increased to 11.7 from 9.6. Plan to increase Jardiance to 25mg  and Glargine to 35U twice daily. I would like patient to meet with Dr. Valentina Lucks as well to discuss her multiple diabetes medications and look for financially affordable options since patient is unable to take Ozempic. We talked in length about the importance of weight loss and exercise and how it could improve both her physical and mental health. Follow up in 1 month to discuss BG changes with medication adjustments.

## 2019-11-21 NOTE — Assessment & Plan Note (Signed)
Patient with recent x-rays with no significant change from last. Received a knee injection last month. Discussed importance of weight loss as this is likely the root of the problem. Patient agreeable to try and swim. Also advised that patient use cane to help with weight bearing. Bariatric surgery would help but patient has not shown to have decline in weight. Referral has been placed with physical therapy as well by sports medicine office.

## 2019-11-21 NOTE — Telephone Encounter (Signed)
Patient presents for follow up appointment with FMLA paperwork from [name] [employer] [place].   Patient portion of form has been completed. Attached Form completion request form and placed in provider box.   Please route to "RN Team", once form has been completed.   Patient wishes to be called at [phone] once paperwork has been completed.   Talbot Grumbling, RN

## 2019-11-21 NOTE — Assessment & Plan Note (Addendum)
Patient tearful on exam today. Score of 15 on PHQ-9. No SI/HI. Encouraged patient to not cancel appointment with therapist in November. She is also followed up with behavioral health clinic. She has no plan to harm herself and states she would never harm anyone else.

## 2019-11-22 LAB — CBC
Hematocrit: 33 % — ABNORMAL LOW (ref 34.0–46.6)
Hemoglobin: 10.1 g/dL — ABNORMAL LOW (ref 11.1–15.9)
MCH: 25.6 pg — ABNORMAL LOW (ref 26.6–33.0)
MCHC: 30.6 g/dL — ABNORMAL LOW (ref 31.5–35.7)
MCV: 84 fL (ref 79–97)
Platelets: 464 10*3/uL — ABNORMAL HIGH (ref 150–450)
RBC: 3.94 x10E6/uL (ref 3.77–5.28)
RDW: 13.7 % (ref 11.7–15.4)
WBC: 10.1 10*3/uL (ref 3.4–10.8)

## 2019-11-22 LAB — FERRITIN: Ferritin: 15 ng/mL (ref 15–150)

## 2019-11-24 ENCOUNTER — Encounter: Payer: Self-pay | Admitting: Sports Medicine

## 2019-11-24 ENCOUNTER — Encounter: Payer: Self-pay | Admitting: Family Medicine

## 2019-11-24 NOTE — Progress Notes (Signed)
Discussed with patient via telephone results of CBC and ferritin. Patient is chronically anemic with hx of iron infusions and transfusions. Hgb stable at 10.1, not in transfusion range. Encouraged patient to take iron supplements daily.

## 2019-11-30 ENCOUNTER — Other Ambulatory Visit: Payer: Self-pay | Admitting: Family Medicine

## 2019-11-30 ENCOUNTER — Telehealth: Payer: Self-pay

## 2019-11-30 NOTE — Telephone Encounter (Signed)
Pt agreeable with the plan. Appt with Dr. Lucia Gaskins on 12/07/19 @ 9:15 am.

## 2019-12-01 ENCOUNTER — Telehealth: Payer: Self-pay | Admitting: Sports Medicine

## 2019-12-01 NOTE — Telephone Encounter (Signed)
  Patient notified of right ankle x-ray results.  X-rays show advancing DJD.  Her pain is debilitating.  Therefore, I recommend referral to Dr. Lucia Gaskins to discuss treatment options.  I will defer further work-up and treatment to his discretion. Of note, left knee x-rays are unchanged when compared to previous films.

## 2019-12-05 ENCOUNTER — Encounter (HOSPITAL_COMMUNITY): Payer: Self-pay | Admitting: Psychiatry

## 2019-12-05 ENCOUNTER — Telehealth (INDEPENDENT_AMBULATORY_CARE_PROVIDER_SITE_OTHER): Payer: BC Managed Care – PPO | Admitting: Psychiatry

## 2019-12-05 ENCOUNTER — Other Ambulatory Visit: Payer: Self-pay

## 2019-12-05 DIAGNOSIS — F3130 Bipolar disorder, current episode depressed, mild or moderate severity, unspecified: Secondary | ICD-10-CM | POA: Diagnosis not present

## 2019-12-05 DIAGNOSIS — F331 Major depressive disorder, recurrent, moderate: Secondary | ICD-10-CM

## 2019-12-05 DIAGNOSIS — F411 Generalized anxiety disorder: Secondary | ICD-10-CM | POA: Diagnosis not present

## 2019-12-05 MED ORDER — CLONIDINE HCL 0.2 MG PO TABS: 0.2000 mg | ORAL_TABLET | Freq: Two times a day (BID) | ORAL | 2 refills | Status: DC

## 2019-12-05 MED ORDER — FLUOXETINE HCL 40 MG PO CAPS: 80.0000 mg | ORAL_CAPSULE | Freq: Every day | ORAL | 2 refills | Status: DC

## 2019-12-05 MED ORDER — MIRTAZAPINE 15 MG PO TABS: 15.0000 mg | ORAL_TABLET | Freq: Every day | ORAL | 2 refills | Status: DC

## 2019-12-05 MED ORDER — MELATONIN 3 MG PO CAPS: 3.0000 mg | ORAL_CAPSULE | Freq: Every evening | ORAL | 2 refills | Status: DC

## 2019-12-05 MED ORDER — CARIPRAZINE HCL 6 MG PO CAPS: 6.0000 mg | ORAL_CAPSULE | Freq: Every day | ORAL | 2 refills | Status: DC

## 2019-12-05 MED ORDER — HYDROXYZINE PAMOATE 25 MG PO CAPS: 25.0000 mg | ORAL_CAPSULE | Freq: Three times a day (TID) | ORAL | 2 refills | Status: DC | PRN

## 2019-12-05 NOTE — Progress Notes (Signed)
[place] MD/PA/NP OP Progress Note Virtual Visit via Telephone Note  I connected with [name] on 12/05/19 at  5:00 PM EDT by telephone and verified that I am speaking with the correct person using two identifiers.  Location: Patient: home Provider: Clinic   I discussed the limitations, risks, security and privacy concerns of performing an evaluation and management service by telephone and the availability of in person appointments. I also discussed with the patient that there may be a patient responsible charge related to this service. The patient expressed understanding and agreed to proceed.   I provided 30 minutes of non-face-to-face time during this encounter.    12/05/2019 2:24 PM [name]  MRN:  [id number]  Chief Complaint: Things are so much better. I think the medication combination works well"  HPI: 49 year old female seen today for follow up psychiatric evaluation.  She has a psychiatric history of OCD, adjustment disorder, anxiety, depression, SI, and bipolar disorder.  She is currently being managed on Clonidine 0.2 mg daily, Vraylar 6 mg daily, Prozac 80 mg daily, Vistaril 25 mg three times daily, and mirtazapine 15 mg at bedtime.  She notes that her medications are effective in managing her psychiatric conditions.  Today patient is cheerful, pleasant, engaged in conversation, and cooperative.  She notes that she occasionally has symptoms of depression and anxiety however notes that overall she is doing well.  She informed provider that she is feeling much better and notes that she believes her medication combination is working well together.  She also informed provider that some stressors include finances however she notes that she is learning how to cope with this stressor more positively.  She informed provider that she continues to listen to  calming music, look at at [place] shopping channels, and listening to a podcast to help manage her anxiety. She denies SI/HI/VH  or paranoia.  No medication adjustments made today.  Patient is agreeable to continue all medications as prescribed.  No other concerns noted at this time.   Visit Diagnosis:    ICD-10-CM   1. Generalized anxiety disorder  F41.1 FLUoxetine (PROZAC) 40 MG capsule    hydrOXYzine (VISTARIL) 25 MG capsule    cloNIDine (CATAPRES) 0.2 MG tablet  2. Moderate episode of recurrent major depressive disorder (HCC)  F33.1 FLUoxetine (PROZAC) 40 MG capsule    mirtazapine (REMERON) 15 MG tablet  3. Bipolar I disorder, most recent episode depressed (HCC)  F31.30 Melatonin 3 MG CAPS    Cariprazine HCl 6 MG CAPS    Past Psychiatric History: OCD, adjustment disorder, anxiety, depression, SI, and bipolar disorder.   Past Medical History:  Past Medical History:  Diagnosis Date  . Acute renal failure ([place]) 05/27/10   hemodialysis for 6 weeks  . Anxiety   . Asthma   . Depression   . Diabetes mellitus   . Hypertension   . Rhabdomyolysis 06/06/10   after drug overdose  . Suicide attempt by drug ingestion ([place]) 06/05/10   result rhabdomyolosis and ARF requrining dialysis     Past Surgical History:  Procedure Laterality Date  . ANKLE ARTHROSCOPY Right 08/17/2015   Procedure: RIGHT ANKLE ARTHROSCOPY WITH SYNOVECTOMY AND LOOSE BODY EXCISION;  Surgeon: Melrose Nakayama, MD;  Location: [facility];  Service: Orthopedics;  Laterality: Right;  . CHOLECYSTECTOMY    . TUBAL LIGATION  1998    Family Psychiatric History: Unknown  Family History:  Family History  Problem Relation Age of Onset  . Asthma Father  Social History:  Social History   Socioeconomic History  . Marital status: Married    Spouse name: Not on file  . Number of children: 2  . Years of education: Not on file  . Highest education level: Not on file  Occupational History  . Occupation: Corporate treasurer  Tobacco Use  . Smoking status: Never Smoker  . Smokeless tobacco: Never Used  Vaping Use  . Vaping Use: Never used  Substance  and Sexual Activity  . Alcohol use: No  . Drug use: No  . Sexual activity: Yes    Comment: with husband   Other Topics Concern  . Not on file  Social History Narrative   Married high school boyfriend at age 29, two boys, still married but he has been living with other women for years.   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 Difficulty of Paying Living Expenses: Not on file  Food Insecurity:   . Worried About Charity fundraiser in the Last Year: Not on file  . Ran Out of Food in the Last Year: Not on file  Transportation Needs:   . Lack of Transportation (Medical): Not on file  . Lack of Transportation (Non-Medical): Not on file  Physical Activity:   . Days of Exercise per Week: Not on file  . Minutes of Exercise per Session: Not on file  Stress:   . Feeling of Stress : Not on file  Social Connections:   . Frequency of Communication with Friends and Family: Not on file  . Frequency of Social Gatherings with Friends and Family: Not on file  . Attends Religious Services: Not on file  . Active Member of Clubs or Organizations: Not on file  . Attends Archivist Meetings: Not on file  . Marital Status: Not on file    Allergies:  Allergies  Allergen Reactions  . Ace Inhibitors Other (See Comments)    Patient had acute renal failure requiring hemodialysis after a suicide attempt.  Nephro recommended avoiding use.  . Haldol [Haloperidol Lactate] Other (See Comments)    Tardive dyskinesia  . Nsaids Other (See Comments)    Nephro recommended avoiding after acute renal failure requiring hemodialysis associated with a suicide attempt.  . Latex Other (See Comments)    Metabolic Disorder Labs: Lab Results  Component Value Date   HGBA1C 11.7 (A) 11/21/2019   MPG 243.17 11/14/2017   MPG 186 05/09/2015   No results found for: PROLACTIN Lab Results   Component Value Date   CHOL 195 07/13/2019   TRIG 251 (H) 07/13/2019   HDL 52 07/13/2019   CHOLHDL 3.8 07/13/2019   VLDL 46 (H) 04/10/2015   LDLCALC 101 (H) 07/13/2019   LDLCALC 55 04/10/2015   Lab Results  Component Value Date   TSH 3.247 11/13/2017   TSH 2.016 03/14/2017    Therapeutic Level Labs: No results found for: LITHIUM No results found for: VALPROATE No components found for:  CBMZ  Current Medications: Current Outpatient Medications  Medication Sig Dispense Refill  . albuterol (VENTOLIN HFA) 108 (90 Base) MCG/ACT inhaler INHALE 2 PUFFS BY MOUTH EVERY 6 HOURS AS NEEDED FOR WHEEZING FOR SHORTNESS OF BREATH 18 g 0  . aspirin EC 81 MG tablet Take 81 mg by mouth daily.    Marland Kitchen atorvastatin (LIPITOR) 40 MG tablet  Take 1 tablet (40 mg total) by mouth daily. (Patient not taking: Reported on 10/14/2019) 90 tablet 3  . Cariprazine HCl 6 MG CAPS Take 1 capsule (6 mg total) by mouth daily. 30 capsule 2  . cloNIDine (CATAPRES) 0.2 MG tablet Take 1 tablet (0.2 mg total) by mouth 2 (two) times daily. 60 tablet 2  . empagliflozin (JARDIANCE) 25 MG TABS tablet Take 1 tablet (25 mg total) by mouth daily. 30 tablet 3  . ferrous sulfate 325 (65 FE) MG tablet Take 1 tablet (325 mg total) by mouth 3 (three) times daily with meals. (Patient taking differently: Take 325 mg by mouth daily. Take every other day with stool softer) 90 tablet 3  . FLUoxetine (PROZAC) 40 MG capsule Take 2 capsules (80 mg total) by mouth daily. 60 capsule 2  . hydrOXYzine (VISTARIL) 25 MG capsule Take 1 capsule (25 mg total) by mouth 3 (three) times daily as needed for itching. 90 capsule 2  . Insulin Glargine (BASAGLAR KWIKPEN) 100 UNIT/ML Take 35U twice a day 15 mL 6  . ipratropium-albuterol (DUONEB) 0.5-2.5 (3) MG/3ML SOLN Take 3 mLs by nebulization 3 (three) times daily as needed. 360 mL 3  . levothyroxine (SYNTHROID) 300 MCG tablet Take 1 tablet (300 mcg total) by mouth daily. 90 tablet 3  . lisinopril (ZESTRIL) 5 MG  tablet Take 1 tablet by mouth once daily 30 tablet 0  . LORazepam (ATIVAN) 1 MG tablet TAKE 1 TABLET BY MOUTH EVERY 8 HOURS AS NEEDED FOR ANXIETY **DO  NOT  FILL  LESS  THAN  30  DAYS  FROM  LAST  REFILL** 45 tablet 0  . Melatonin 3 MG CAPS Take 1 capsule (3 mg total) by mouth at bedtime. 60 capsule 2  . meloxicam (MOBIC) 15 MG tablet TAKE 1 TABLET BY MOUTH ONCE DAILY WITH MEALS (Patient not taking: Reported on 10/14/2019) 15 tablet 0  . metFORMIN (GLUCOPHAGE) 1000 MG tablet Take 1 tablet (1,000 mg total) by mouth 2 (two) times daily with a meal. 60 tablet 2  . metoprolol succinate (TOPROL-XL) 25 MG 24 hr tablet Take 1 tablet by mouth once daily 30 tablet 0  . mirtazapine (REMERON) 15 MG tablet Take 1 tablet (15 mg total) by mouth daily. 30 tablet 2  . naproxen (NAPROSYN) 500 MG tablet Take 1 tablet (500 mg total) by mouth 2 (two) times daily with a meal. 60 tablet 0  . pantoprazole (PROTONIX) 40 MG tablet Take 1 tablet (40 mg total) by mouth daily. 30 tablet 3  . Semaglutide,0.25 or 0.5MG /DOS, (OZEMPIC, 0.25 OR 0.5 MG/DOSE,) 2 MG/1.5ML SOPN Inject 1 mg into the skin once a week. 3 mL 2  . Vitamin D, Ergocalciferol, (DRISDOL) 50000 units CAPS capsule Take 1 capsule (50,000 Units total) by mouth every 7 (seven) days. Sunday (Patient not taking: Reported on 10/14/2019) 12 capsule 0   No current facility-administered medications for this visit.     Musculoskeletal: Strength & Muscle Tone: Unable to assess patient unable to log in virtually. Visit on telephone Gait & Station: Unable to assess patient unable to log in virtually. Visit on telephone Patient leans: N/A  Psychiatric Specialty Exam: Review of Systems  There were no vitals taken for this visit.There is no height or weight on file to calculate BMI.  General Appearance: Unable to assess patient unable to log in virtually. Visit on telephone  Eye Contact:  Unable to assess patient unable to log in virtually. Visit on telephone  Speech:  Clear and Coherent and Normal Rate  Volume:  Normal  Mood:  Euthymic  Affect:  Appropriate and Congruent  Thought Process:  Coherent, Goal Directed and Linear  Orientation:  Full (Time, Place, and Person)  Thought Content: WDL and Logical   Suicidal Thoughts:  No  Homicidal Thoughts:  No  Memory:  Immediate;   Good Recent;   Good Remote;   Good  Judgement:  Good  Insight:  Good  Psychomotor Activity:  Normal  Concentration:  Concentration: Good and Attention Span: Good  Recall:  Good  Fund of Knowledge: Good  Language: Good  Akathisia:  No  Handed:  Right  AIMS (if indicated): Not done  Assets:  Communication Skills Desire for Improvement Financial Resources/Insurance Housing Social Support  ADL's:  Intact  Cognition: WNL  Sleep:  Good   Screenings: PHQ2-9     Office Visit from 11/21/2019 in [facility] Office Visit from 10/14/2019 in [facility] Office Visit from 09/30/2019 in [facility] Office Visit from 07/25/2019 in [facility] [place] [facility] Office Visit from 07/13/2019 in [facility]  PHQ-2 Total Score 4 2 4 2 2   PHQ-9 Total Score 17 7 15 7 8        Assessment and Plan: Patient notes that she is ocasssionaly depressed and anxious. She however notes that overall she is doing well on her current medication regimen. No medications adjustments made today. She will continue all medications as prescribed.   1. Generalized anxiety disorder  Continue - FLUoxetine (PROZAC) 40 MG capsule; Take 2 capsules (80 mg total) by mouth daily.  Dispense: 60 capsule; Refill: 2 Continue- hydrOXYzine (VISTARIL) 25 MG capsule; Take 1 capsule (25 mg total) by mouth 3 (three) times daily as needed for itching.  Dispense: 90 capsule; Refill: 2 Continue- cloNIDine (CATAPRES) 0.2 MG tablet; Take 1 tablet (0.2 mg total) by mouth 2 (two) times daily.  Dispense: 60 tablet; Refill: 2  2. Moderate episode of  recurrent major depressive disorder (HCC)  Continue- FLUoxetine (PROZAC) 40 MG capsule; Take 2 capsules (80 mg total) by mouth daily.  Dispense: 60 capsule; Refill: 2 Continue- mirtazapine (REMERON) 15 MG tablet; Take 1 tablet (15 mg total) by mouth daily.  Dispense: 30 tablet; Refill: 2  3. Bipolar I disorder, most recent episode depressed (HCC)  Continue- Melatonin 3 MG CAPS; Take 1 capsule (3 mg total) by mouth at bedtime.  Dispense: 60 capsule; Refill: 2 Continue- Cariprazine HCl 6 MG CAPS; Take 1 capsule (6 mg total) by mouth daily.  Dispense: 30 capsule; Refill: 2   Follow-up in 3 months Follow-up with therapy Salley Slaughter, NP 12/05/2019, 2:24 PM

## 2019-12-21 ENCOUNTER — Other Ambulatory Visit: Payer: Self-pay | Admitting: Family Medicine

## 2019-12-21 DIAGNOSIS — F411 Generalized anxiety disorder: Secondary | ICD-10-CM

## 2020-01-06 ENCOUNTER — Encounter: Payer: Self-pay | Admitting: Family Medicine

## 2020-01-06 ENCOUNTER — Other Ambulatory Visit: Payer: Self-pay

## 2020-01-06 ENCOUNTER — Ambulatory Visit (INDEPENDENT_AMBULATORY_CARE_PROVIDER_SITE_OTHER): Payer: BC Managed Care – PPO | Admitting: Family Medicine

## 2020-01-06 VITALS — BP 128/84 | HR 103 | Ht 71.0 in | Wt 397.1 lb

## 2020-01-06 DIAGNOSIS — M19071 Primary osteoarthritis, right ankle and foot: Secondary | ICD-10-CM

## 2020-01-06 MED ORDER — DICLOFENAC SODIUM 1 % EX GEL: 2.0000 g | Freq: Four times a day (QID) | CUTANEOUS | 1 refills | Status: DC

## 2020-01-06 NOTE — Assessment & Plan Note (Addendum)
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  She was informed that NSAIDs and Tylenol would be appropriate for pain control at this time and that our best options for pain relief and improve mobility would be with orthopedic surgery. -Naproxen 500 twice daily (kidney function noted) -Voltaren gel 4 times daily as needed -Tylenol as needed naproxen is not helpful -Physical therapy referral placed -Encouraged to follow-up with orthopedic surgery when possible -Discussed weight loss (she has been losing weight well and is currently on a GLP-1 for diabetes)

## 2020-01-06 NOTE — Patient Instructions (Signed)
I am sorry that you have been having so much pain with the arthritis of your ankle.  Today, we had added Voltaren gel which is a topical medicine that can help with pain relief and I have sent in a referral to physical therapy for continued strengthening conditioning which may help little bit.  Ultimately, I think that a orthopedic surgeon will be most helpful in addressing this problem.

## 2020-01-06 NOTE — Progress Notes (Signed)
    SUBJECTIVE:   CHIEF COMPLAINT / HPI:   Ankle arthritis Ms. Burbridge reports that she has been having arthritis of her left ankle for roughly the past 6 years.  This started with a severe injury and ongoing ankle pain.  She is previously been seen by orthopedics and had an ankle procedure performed.  She is also followed up with sports medicine.  Most recently, she follow-up with sports medicine about 1 month ago and x-rays were done.  She received a call saying x-rays indicated significant arthritis but there were no further interventions that sports medicine could provide.  They recommended that she follow-up with orthopedics.  She does not currently have the money to be seen by orthopedics or decided to come to the family practice center to see if any further interventions could be done to help with her ongoing ankle pain.  She notes that she has continued pain on the top of her foot and side of her ankle related to her arthritis.  There has not been any significant changes in the type of pain on this slow progression.  PERTINENT  PMH / PSH: Morbid obesity, ankle and knee arthritis CKD stage III  OBJECTIVE:   BP 128/84   Pulse (!) 103   Ht 5\' 11"  (1.803 m)   Wt (!) 397 lb 2 oz (180.1 kg)   LMP 12/30/2019   SpO2 98%   BMI 55.39 kg/m   General: Alert and cooperative and appears to be in no acute distress sitting comfortably on the exam table.  Antalgic gait. Extremities: No peripheral edema. Warm/ well perfused. Ankle Inspection: No gross deformity or abnormality no significant swelling or erythema. Palpation: Mild tenderness to palpation along the dorsal aspect of the foot above the navicular.  Also tender to palpation along the medial malleolus. ROM: Mildly limited range of motion with dorsiflexion and plantar flexion due to pain. Neurovascularly intact  ASSESSMENT/PLAN:   Osteoarthritis She was informed that I agreed with the sports medicine physicians who noted worsening  osteoarthritis of her right ankle.  She was informed that NSAIDs and Tylenol would be appropriate for pain control at this time and that our best options for pain relief and improve mobility would be with orthopedic surgery. -Naproxen 500 twice daily (kidney function noted) -Voltaren gel 4 times daily as needed -Tylenol as needed naproxen is not helpful -Physical therapy referral placed -Encouraged to follow-up with orthopedic surgery when possible -Discussed weight loss (she has been losing weight well and is currently on a GLP-1 for diabetes)     Matilde Haymaker, MD [place]

## 2020-01-12 ENCOUNTER — Ambulatory Visit: Payer: BC Managed Care – PPO | Admitting: Nurse Practitioner

## 2020-01-12 ENCOUNTER — Ambulatory Visit (HOSPITAL_COMMUNITY): Payer: BC Managed Care – PPO | Admitting: Licensed Clinical Social Worker

## 2020-01-17 ENCOUNTER — Telehealth: Payer: Self-pay

## 2020-01-17 NOTE — Telephone Encounter (Signed)
Received phone call from pharmacy regarding ozemipic injections. Medication is no longer manufactured in 2 mg pens. Current stock is for 1 mg pens. Advised that they could dispense 1 box of 1 mg pens for patient with 0 refills, as patient will need diabetes follow up in December.   FYI to PCP  Talbot Grumbling, RN

## 2020-01-30 ENCOUNTER — Telehealth (HOSPITAL_COMMUNITY): Payer: Self-pay | Admitting: Licensed Clinical Social Worker

## 2020-01-30 ENCOUNTER — Other Ambulatory Visit: Payer: Self-pay

## 2020-01-30 ENCOUNTER — Ambulatory Visit (HOSPITAL_COMMUNITY): Payer: BC Managed Care – PPO | Admitting: Licensed Clinical Social Worker

## 2020-01-30 NOTE — Telephone Encounter (Signed)
LCSW phoned pt re video capability for session being down today. Pt agreeable to reschedule and states she would like to come in person since this is counseling. She thought this was another med f/u. LCSW rescheduled pt for Feb 10, 2020 at 3p per her agreement for in person session. Pt states appreciation.

## 2020-02-21 ENCOUNTER — Other Ambulatory Visit: Payer: Self-pay | Admitting: Family Medicine

## 2020-02-21 DIAGNOSIS — F411 Generalized anxiety disorder: Secondary | ICD-10-CM

## 2020-03-01 ENCOUNTER — Ambulatory Visit (INDEPENDENT_AMBULATORY_CARE_PROVIDER_SITE_OTHER): Payer: BC Managed Care – PPO | Admitting: Family Medicine

## 2020-03-01 ENCOUNTER — Other Ambulatory Visit: Payer: Self-pay

## 2020-03-01 ENCOUNTER — Telehealth: Payer: Self-pay

## 2020-03-01 VITALS — BP 126/70 | HR 98 | Ht 71.0 in | Wt 399.2 lb

## 2020-03-01 DIAGNOSIS — R040 Epistaxis: Secondary | ICD-10-CM | POA: Diagnosis not present

## 2020-03-01 DIAGNOSIS — J3489 Other specified disorders of nose and nasal sinuses: Secondary | ICD-10-CM | POA: Diagnosis not present

## 2020-03-01 MED ORDER — OXYMETAZOLINE HCL 0.05 % NA SOLN: 1.0000 | Freq: Every day | NASAL | 0 refills | Status: AC

## 2020-03-01 MED ORDER — FLUTICASONE PROPIONATE 50 MCG/ACT NA SUSP: 1.0000 | Freq: Every day | NASAL | 1 refills | Status: DC

## 2020-03-01 NOTE — Progress Notes (Signed)
    SUBJECTIVE:   CHIEF COMPLAINT / HPI:   Nosebleed  Sinus pain: Patient presents to clinic today reporting 2 weeks of seeing blood on her tissue when she blows her nose. She denies any concerns for brisk nosebleed. She reports her nose has been very dry. She also reports some left-sided sinus pain below her eye in the cheek area. She has been using a humidifier at home and has been coating the inside of her nose with Vaseline, but otherwise has not tried any home remedies. Denies fevers, chills, body aches, cough, shortness of breath.  PERTINENT  PMH / PSH:  Anxiety, type 2 diabetes, hypertension, HLD   OBJECTIVE:   BP 126/70   Pulse 98   Ht 5\' 11"  (1.803 m)   Wt (!) 399 lb 4 oz (181.1 kg)   LMP 10/09/2019   SpO2 97%   BMI 55.68 kg/m    Physical exam: General: Well-appearing patient, no apparent distress HEENT: Normocephalic, atraumatic, EOMI, PERRLA, patent external auditory canals bilaterally with normal-appearing TMs without effusion, bilateral nares with crusting and erythematous/edematous turbinates, bilateral nares remain patent, no source of bleed appreciated on exam, no pharyngeal erythema or tonsillar exudate, no anterior cervical lymphadenopathy Respiratory: Comfortable work of breathing, speaking complete sentences   ASSESSMENT/PLAN:   Bleeding nose Patient reporting occasional blood on her tissue after she blows her nose.  Reports dry nose.  No active bleed appreciated on physical exam.  Patient given the following instructions: 1. Spray afrin to both nostrils once daily for the next 3 days to decrease nasal bleeding.  2. Wait 20 minutes after spraying after to spray the Flonase. One spritz to each nostril daily.  3.  Should you experience a nosebleed that last longer than 10 minutes after applying direct pressure for 10 minutes please seek emergency medical care 4.  You can coat the inner lining of your nose with a thin layer of Vaseline or bacitracin to help  decrease dryness 5.  Continue to use humidifier while in the home  Sinus pain Patient with reported sinus pain/tenderness on the left side of her cheek.  Is otherwise well-appearing, no fevers, cough, shortness of breath. -Instructed to use Flonase to each nostril once daily     [place]

## 2020-03-01 NOTE — Assessment & Plan Note (Signed)
Patient with reported sinus pain/tenderness on the left side of her cheek.  Is otherwise well-appearing, no fevers, cough, shortness of breath. -Instructed to use Flonase to each nostril once daily

## 2020-03-01 NOTE — Telephone Encounter (Addendum)
Patient calls nurse line regarding two weeks of nose bleeds. Patient reports that every time she blows her nose there is blood on the tissue. Patient has been using a humidifier. Patient reports having left sided sinus pain.   Denies cough, fever and body aches.   Scheduled patient in [facility] this morning for further evaluation.   Talbot Grumbling, RN

## 2020-03-01 NOTE — Assessment & Plan Note (Addendum)
Patient reporting occasional blood on her tissue after she blows her nose.  Reports dry nose.  No active bleed appreciated on physical exam.  Patient given the following instructions: 1. Spray afrin to both nostrils once daily for the next 3 days to decrease nasal bleeding.  2. Wait 20 minutes after spraying after to spray the Flonase. One spritz to each nostril daily.  3.  Should you experience a nosebleed that last longer than 10 minutes after applying direct pressure for 10 minutes please seek emergency medical care 4.  You can coat the inner lining of your nose with a thin layer of Vaseline or bacitracin to help decrease dryness 5.  Continue to use humidifier while in the home

## 2020-03-01 NOTE — Patient Instructions (Signed)
Thank you for coming in to see [name] today! Please see below to review our plan for today's visit:  1. Spray afrin to both nostrils once daily for the next 3 days to decrease nasal bleeding.  2. Wait 20 minutes after spraying after to spray the Flonase. One spritz to each nostril daily.    Please call the clinic at [phone] if your symptoms worsen or you have any concerns. It was our pleasure to serve you!   Dr. Milus Banister [facility]

## 2020-03-06 ENCOUNTER — Other Ambulatory Visit: Payer: Self-pay

## 2020-03-06 ENCOUNTER — Telehealth (INDEPENDENT_AMBULATORY_CARE_PROVIDER_SITE_OTHER): Payer: BC Managed Care – PPO | Admitting: Psychiatry

## 2020-03-06 ENCOUNTER — Encounter (HOSPITAL_COMMUNITY): Payer: Self-pay | Admitting: Psychiatry

## 2020-03-06 DIAGNOSIS — F331 Major depressive disorder, recurrent, moderate: Secondary | ICD-10-CM | POA: Diagnosis not present

## 2020-03-06 DIAGNOSIS — F411 Generalized anxiety disorder: Secondary | ICD-10-CM | POA: Diagnosis not present

## 2020-03-06 DIAGNOSIS — F3130 Bipolar disorder, current episode depressed, mild or moderate severity, unspecified: Secondary | ICD-10-CM | POA: Diagnosis not present

## 2020-03-06 MED ORDER — MIRTAZAPINE 15 MG PO TABS: 15.0000 mg | ORAL_TABLET | Freq: Every day | ORAL | 2 refills | Status: DC

## 2020-03-06 MED ORDER — FLUOXETINE HCL 40 MG PO CAPS: 80.0000 mg | ORAL_CAPSULE | Freq: Every day | ORAL | 2 refills | Status: DC

## 2020-03-06 MED ORDER — HYDROXYZINE PAMOATE 25 MG PO CAPS: 25.0000 mg | ORAL_CAPSULE | Freq: Three times a day (TID) | ORAL | 2 refills | Status: DC | PRN

## 2020-03-06 MED ORDER — CLONIDINE HCL 0.2 MG PO TABS: 0.2000 mg | ORAL_TABLET | Freq: Two times a day (BID) | ORAL | 2 refills | Status: DC

## 2020-03-06 MED ORDER — MELATONIN 3 MG PO CAPS: 3.0000 mg | ORAL_CAPSULE | Freq: Every evening | ORAL | 2 refills | Status: DC

## 2020-03-06 NOTE — Addendum Note (Signed)
Addended by: Salley Slaughter on: 03/06/2020 03:53 PM   Modules accepted: Orders

## 2020-03-06 NOTE — Progress Notes (Addendum)
[facility] MD/PA/NP OP Progress Note Virtual Visit via Telephone Note  I connected with [name] on 03/06/20 at  3:00 PM EST by telephone and verified that I am speaking with the correct person using two identifiers.  Location: Patient: home Provider: Clinic   I discussed the limitations, risks, security and privacy concerns of performing an evaluation and management service by telephone and the availability of in person appointments. I also discussed with the patient that there may be a patient responsible charge related to this service. The patient expressed understanding and agreed to proceed.   I provided 30 minutes of non-face-to-face time during this encounter.    03/06/2020 1:50 PM [name]  MRN:  [id number]  Chief Complaint: "The holidays are tuff but I have been doing okay"  HPI: 49 year old female seen today for follow up psychiatric evaluation.  She has a psychiatric history of OCD, adjustment disorder, anxiety, depression, SI, and bipolar disorder.  She is currently being managed on Clonidine 0.2 mg daily, Vraylar 6 mg daily, Prozac 80 mg daily, Vistaril 25 mg three times daily, and mirtazapine 15 mg at bedtime.  She notes that her medications are effective in managing her psychiatric conditions.  Today patient is pleasant, engaged in conversation, and cooperative.  She notes that the holiday was tuff because she missed her family. She notes that her mother died 20 years ago, her son died 62 years ago, and her father died 2 years ago. She informed Probation officer that for Christmas she stayed in her home to clean instead of visiting with her husbands family. She notes that her husband is supportive and encourages her to take her medications.  A PHQ 9 was conducted on 03/01/2020 and patient scored a 5. She notes that since the holidays are over her anxiety and depression has improved. To cope with occasional anxiety and depression she notes that she listen to  calming music, look at at  [facility] shopping channels, and listening to a podcast. Today she denies SI/HI/VAH or paranoia.  Provider received notification that patient has been taking Metoprolol Succinate ER.  In combination with clonidine 0.2 mg this medication can lead to an increased risk of sinus bradycardia.  Provider recommended patient cut clonidine 0.2 mg in half for a week and then discontinue.  She was agreeable to this recommendation.  She will continue all medications as prescribed.  No other concerns noted at this time.   Visit Diagnosis:    ICD-10-CM   1. Generalized anxiety disorder  F41.1 FLUoxetine (PROZAC) 40 MG capsule    hydrOXYzine (VISTARIL) 25 MG capsule    cloNIDine (CATAPRES) 0.2 MG tablet  2. Moderate episode of recurrent major depressive disorder (HCC)  F33.1 FLUoxetine (PROZAC) 40 MG capsule    mirtazapine (REMERON) 15 MG tablet  3. Bipolar I disorder, most recent episode depressed ([place])  F31.30 Melatonin 3 MG CAPS    Past Psychiatric History: OCD, adjustment disorder, anxiety, depression, SI, and bipolar disorder.   Past Medical History:  Past Medical History:  Diagnosis Date   Acute renal failure ([place]) 05/27/10   hemodialysis for 6 weeks   Anxiety    Asthma    Depression    Diabetes mellitus    Hypertension    Rhabdomyolysis 06/06/10   after drug overdose   Suicide attempt by drug ingestion ([place]) 06/05/10   result rhabdomyolosis and ARF requrining dialysis     Past Surgical History:  Procedure Laterality Date   ANKLE ARTHROSCOPY Right 08/17/2015  Procedure: RIGHT ANKLE ARTHROSCOPY WITH SYNOVECTOMY AND LOOSE BODY EXCISION;  Surgeon: Melrose Nakayama, MD;  Location: [facility];  Service: Orthopedics;  Laterality: Right;   CHOLECYSTECTOMY     TUBAL LIGATION  1998    Family Psychiatric History: Unknown  Family History:  Family History  Problem Relation Age of Onset   Asthma Father     Social History:  Social History   Socioeconomic History   Marital status: Married     Spouse name: Not on file   Number of children: 2   Years of education: Not on file   Highest education level: Not on file  Occupational History   Occupation: Corporate treasurer  Tobacco Use   Smoking status: Never Smoker   Smokeless tobacco: Never Used  Scientific laboratory technician Use: Never used  Substance and Sexual Activity   Alcohol use: No   Drug use: No   Sexual activity: Yes    Comment: with husband   Other Topics Concern   Not on file  Social History Narrative   Married high school boyfriend at age 43, two boys, still married but he has been living with other women for years.   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  Nephro recommended avoiding use.   Haldol [Haloperidol Lactate] Other (See Comments)    Tardive dyskinesia   Nsaids Other (See Comments)    Nephro recommended avoiding after acute renal failure requiring hemodialysis associated with a suicide attempt.   Latex Other (See Comments)    Metabolic Disorder Labs: Lab Results  Component Value Date   HGBA1C 11.7 (A) 11/21/2019   MPG 243.17 11/14/2017   MPG 186 05/09/2015   No results found for: PROLACTIN Lab Results  Component Value Date   CHOL 195 07/13/2019   TRIG 251 (H) 07/13/2019   HDL 52 07/13/2019   CHOLHDL 3.8 07/13/2019   VLDL 46 (H) 04/10/2015   LDLCALC 101 (H) 07/13/2019   LDLCALC 55 04/10/2015   Lab Results  Component Value Date   TSH 3.247 11/13/2017   TSH 2.016 03/14/2017    Therapeutic Level Labs: No results found for:  LITHIUM No results found for: VALPROATE No components found for:  CBMZ  Current Medications: Current Outpatient Medications  Medication Sig Dispense Refill   albuterol (VENTOLIN HFA) 108 (90 Base) MCG/ACT inhaler INHALE 2 PUFFS BY MOUTH EVERY 6 HOURS AS NEEDED FOR WHEEZING FOR SHORTNESS OF BREATH 18 g 0   aspirin EC 81 MG tablet Take 81 mg by mouth daily.     atorvastatin (LIPITOR) 40 MG tablet Take 1 tablet (40 mg total) by mouth daily. (Patient not taking: Reported on 10/14/2019) 90 tablet 3   Cariprazine HCl 6 MG CAPS Take 1 capsule (6 mg total) by mouth daily. 30 capsule 2   cloNIDine (CATAPRES) 0.2 MG tablet Take 1 tablet (0.2 mg total) by mouth 2 (two) times daily. 60 tablet 2   diclofenac Sodium (VOLTAREN) 1 % GEL Apply 2 g topically  4 (four) times daily. 100 g 1   empagliflozin (JARDIANCE) 25 MG TABS tablet Take 1 tablet (25 mg total) by mouth daily. 30 tablet 3   ferrous sulfate 325 (65 FE) MG tablet Take 1 tablet (325 mg total) by mouth 3 (three) times daily with meals. (Patient taking differently: Take 325 mg by mouth daily. Take every other day with stool softer) 90 tablet 3   FLUoxetine (PROZAC) 40 MG capsule Take 2 capsules (80 mg total) by mouth daily. 60 capsule 2   fluticasone (FLONASE) 50 MCG/ACT nasal spray Place 1 spray into both nostrils daily. 1 spray in each nostril every day 16 g 1   hydrOXYzine (VISTARIL) 25 MG capsule Take 1 capsule (25 mg total) by mouth 3 (three) times daily as needed for itching. 90 capsule 2   Insulin Glargine (BASAGLAR KWIKPEN) 100 UNIT/ML Take 35U twice a day 15 mL 6   ipratropium-albuterol (DUONEB) 0.5-2.5 (3) MG/3ML SOLN Take 3 mLs by nebulization 3 (three) times daily as needed. 360 mL 3   levothyroxine (SYNTHROID) 300 MCG tablet Take 1 tablet (300 mcg total) by mouth daily. 90 tablet 3   lisinopril (ZESTRIL) 5 MG tablet Take 1 tablet by mouth once daily 30 tablet 0   LORazepam (ATIVAN) 1 MG tablet TAKE 1 TABLET BY MOUTH EVERY 8  HOURS AS NEEDED FOR ANXIETY **DO  NOT  FILL  LESS  THAN  30  DAYS  FROM  LAST  REFILL** 45 tablet 0   Melatonin 3 MG CAPS Take 1 capsule (3 mg total) by mouth at bedtime. 60 capsule 2   metFORMIN (GLUCOPHAGE) 1000 MG tablet Take 1 tablet (1,000 mg total) by mouth 2 (two) times daily with a meal. 60 tablet 2   metoprolol succinate (TOPROL-XL) 25 MG 24 hr tablet Take 1 tablet by mouth once daily 30 tablet 0   mirtazapine (REMERON) 15 MG tablet Take 1 tablet (15 mg total) by mouth daily. 30 tablet 2   pantoprazole (PROTONIX) 40 MG tablet Take 1 tablet (40 mg total) by mouth daily. 30 tablet 3   Semaglutide,0.25 or 0.5MG /DOS, (OZEMPIC, 0.25 OR 0.5 MG/DOSE,) 2 MG/1.5ML SOPN Inject 1 mg into the skin once a week. 3 mL 2   Vitamin D, Ergocalciferol, (DRISDOL) 50000 units CAPS capsule Take 1 capsule (50,000 Units total) by mouth every 7 (seven) days. Sunday (Patient not taking: Reported on 10/14/2019) 12 capsule 0   No current facility-administered medications for this visit.     Musculoskeletal: Strength & Muscle Tone: Unable to assess patient unable to log in virtually. Visit on telephone Gait & Station: Unable to assess patient unable to log in virtually. Visit on telephone Patient leans: N/A  Psychiatric Specialty Exam: Review of Systems  There were no vitals taken for this visit.There is no height or weight on file to calculate BMI.  General Appearance: Unable to assess patient unable to log in virtually. Visit on telephone  Eye Contact:  Unable to assess patient unable to log in virtually. Visit on telephone  Speech:  Clear and Coherent and Normal Rate  Volume:  Normal  Mood:  Euthymic  Affect:  Appropriate and Congruent  Thought Process:  Coherent, Goal Directed and Linear  Orientation:  Full (Time, Place, and Person)  Thought Content: WDL and Logical   Suicidal Thoughts:  No  Homicidal Thoughts:  No  Memory:  Immediate;   Good Recent;   Good Remote;   Good  Judgement:  Good   Insight:  Good  Psychomotor Activity:  Normal  Concentration:  Concentration: Good and Attention Span: Good  Recall:  Good  Fund of Knowledge: Good  Language: Good  Akathisia:  No  Handed:  Right  AIMS (if indicated): Not done  Assets:  Communication Skills Desire for Improvement Financial Resources/Insurance Housing Social Support  ADL's:  Intact  Cognition: WNL  Sleep:  Good   Screenings: PHQ2-9   [facility] Office Visit from 03/01/2020 in [facility] Office Visit from 01/06/2020 in [facility] Office Visit from 11/21/2019 in [facility] Office Visit from 10/14/2019 in [facility] Office Visit from 09/30/2019 in [facility]  PHQ-2 Total Score 1 2 4 2 4   PHQ-9 Total Score 5 8 17 7 15        Assessment and Plan: Patient notes that the Christmas holiday exacerbated her depression and anxiety. She however notes that overall she is doing well on her current medication regimen. Provider received notification that patient has been taking Metoprolol Succinate ER.  In combination with clonidine 0.2 mg this medication can lead to an increased risk of sinus bradycardia.  Provider recommended patient cut clonidine 0.2 mg in half for a week and then discontinue.  She was agreeable to this recommendation. She will continue all medications as prescribed.   1. Generalized anxiety disorder  Continue - FLUoxetine (PROZAC) 40 MG capsule; Take 2 capsules (80 mg total) by mouth daily.  Dispense: 60 capsule; Refill: 2 Continue- hydrOXYzine (VISTARIL) 25 MG capsule; Take 1 capsule (25 mg total) by mouth 3 (three) times daily as needed for itching.  Dispense: 90 capsule; Refill: 2   2. Moderate episode of recurrent major depressive disorder (HCC)  Continue- FLUoxetine (PROZAC) 40 MG capsule; Take 2 capsules (80 mg total) by mouth daily.  Dispense: 60 capsule; Refill: 2 Continue- mirtazapine  (REMERON) 15 MG tablet; Take 1 tablet (15 mg total) by mouth daily.  Dispense: 30 tablet; Refill: 2  3. Bipolar I disorder, most recent episode depressed (HCC)  Continue- Melatonin 3 MG CAPS; Take 1 capsule (3 mg total) by mouth at bedtime.  Dispense: 60 capsule; Refill: 2 Continue- Cariprazine HCl 6 MG CAPS; Take 1 capsule (6 mg total) by mouth daily.  Dispense: 30 capsule; Refill: 2   Follow-up in 3 months Follow-up with therapy Salley Slaughter, NP 03/06/2020, 1:50 PM

## 2020-04-02 ENCOUNTER — Other Ambulatory Visit: Payer: Self-pay | Admitting: Family Medicine

## 2020-04-02 DIAGNOSIS — F411 Generalized anxiety disorder: Secondary | ICD-10-CM

## 2020-05-21 ENCOUNTER — Other Ambulatory Visit: Payer: Self-pay | Admitting: Family Medicine

## 2020-05-21 DIAGNOSIS — F411 Generalized anxiety disorder: Secondary | ICD-10-CM

## 2020-06-02 ENCOUNTER — Other Ambulatory Visit: Payer: Self-pay | Admitting: Family Medicine

## 2020-06-04 ENCOUNTER — Telehealth (HOSPITAL_COMMUNITY): Payer: BC Managed Care – PPO | Admitting: Psychiatry

## 2020-06-07 ENCOUNTER — Telehealth (INDEPENDENT_AMBULATORY_CARE_PROVIDER_SITE_OTHER): Payer: BC Managed Care – PPO | Admitting: Psychiatry

## 2020-06-07 ENCOUNTER — Encounter (HOSPITAL_COMMUNITY): Payer: Self-pay | Admitting: Psychiatry

## 2020-06-07 ENCOUNTER — Other Ambulatory Visit: Payer: Self-pay

## 2020-06-07 DIAGNOSIS — F3130 Bipolar disorder, current episode depressed, mild or moderate severity, unspecified: Secondary | ICD-10-CM

## 2020-06-07 DIAGNOSIS — F411 Generalized anxiety disorder: Secondary | ICD-10-CM | POA: Diagnosis not present

## 2020-06-07 DIAGNOSIS — F331 Major depressive disorder, recurrent, moderate: Secondary | ICD-10-CM

## 2020-06-07 MED ORDER — MIRTAZAPINE 30 MG PO TABS: 30.0000 mg | ORAL_TABLET | Freq: Every day | ORAL | 2 refills | Status: DC

## 2020-06-07 MED ORDER — MELATONIN 3 MG PO CAPS: 3.0000 mg | ORAL_CAPSULE | Freq: Every evening | ORAL | 2 refills | Status: DC

## 2020-06-07 MED ORDER — FLUOXETINE HCL 40 MG PO CAPS: 80.0000 mg | ORAL_CAPSULE | Freq: Every day | ORAL | 2 refills | Status: DC

## 2020-06-07 MED ORDER — HYDROXYZINE PAMOATE 50 MG PO CAPS
50.0000 mg | ORAL_CAPSULE | Freq: Three times a day (TID) | ORAL | 2 refills | Status: DC | PRN
Start: 2020-06-07 — End: 2020-08-30

## 2020-06-07 MED ORDER — CARIPRAZINE HCL 6 MG PO CAPS
6.0000 mg | ORAL_CAPSULE | Freq: Every day | ORAL | 2 refills | Status: DC
Start: 2020-06-07 — End: 2020-12-20

## 2020-06-07 NOTE — Progress Notes (Signed)
[place] MD/PA/NP OP Progress Note Virtual Visit via Telephone Note  I connected with [name] on 06/07/20 at  4:00 PM EDT by telephone and verified that I am speaking with the correct person using two identifiers.  Location: Patient: Work Provider: Clinic   I discussed the limitations, risks, security and privacy concerns of performing an evaluation and management service by telephone and the availability of in person appointments. I also discussed with the patient that there may be a patient responsible charge related to this service. The patient expressed understanding and agreed to proceed.   I provided 30 minutes of non-face-to-face time during this encounter.    06/07/2020 10:46 AM [name] [place]  MRN:  [id number]  Chief Complaint: "I've had some rough patches"  HPI: 50 year old female seen today for follow up psychiatric evaluation.  She has a psychiatric history of OCD, adjustment disorder, anxiety, depression, SI, and bipolar disorder.  She is currently being managed on Vraylar 6 mg daily, Prozac 80 mg daily, Vistaril 25 mg three times daily, and mirtazapine 15 mg at bedtime.  She notes that her medications are somewhat effective in managing her psychiatric conditions.  Today patient was unable to logon virtually so her assessment was done over the phone.  During exam she was pleasant, engaged in conversation, and cooperative.  She notes that she has had some rough patches since her last visit.  She notes in January her coworker committed suicide.  She also notes that February is a hard month for her because February 3rd is her mother birthday and she died on April 22, 2022 13 years ago.  She also notes that she misses her son however reports that a few weeks ago she looked at a post on her mother's support group website that mentioned deceased children charing their parents on.  She notes that she found encouragement from this and has been pressing her way since.  Patient notes that  her depression continues to be minimal however notes that her anxiety has increased lately.  Today provider conducted a PHQ-9 and patient scored an 11, at her last visit she scored a 5.  Provider also conducted a GAD-7 and patient scored a 21.  Patient notes to cope with her anxiety she counts to 10 before becoming increasingly irritable, listen to books via podcast, listening to calming music, and shop on the shopping networks.  Today she denies SI/HI/VAH, mania, or paranoia.  She endorses adequate sleep and appetite.  Patient is agreeable to increasing hydroxyzine 25 mg 3 times daily to 50 mg 3 times daily to help manage anxiety.  She is also agreeable to increasing mirtazapine 15 mg daily to 30 mg daily to help manage anxiety and depression.  Provider recommended patient talking to her therapist however she notes that she feels that she is too broken to see a therapist.  She notes that she may consider this in the future.  She will continue all other medications as prescribed.  No other concerns noted at this time.   Visit Diagnosis:    ICD-10-CM   1. Moderate episode of recurrent major depressive disorder (HCC)  F33.1 mirtazapine (REMERON) 30 MG tablet    FLUoxetine (PROZAC) 40 MG capsule  2. Bipolar I disorder, most recent episode depressed (HCC)  F31.30 Melatonin 3 MG CAPS    Cariprazine HCl 6 MG CAPS  3. Generalized anxiety disorder  F41.1 hydrOXYzine (VISTARIL) 50 MG capsule    FLUoxetine (PROZAC) 40 MG capsule    Past Psychiatric  History: OCD, adjustment disorder, anxiety, depression, SI, and bipolar disorder.   Past Medical History:  Past Medical History:  Diagnosis Date  . Acute renal failure ([place]) 05/27/10   hemodialysis for 6 weeks  . Anxiety   . Asthma   . Depression   . Diabetes mellitus   . Hypertension   . Rhabdomyolysis 06/06/10   after drug overdose  . Suicide attempt by drug ingestion ([place]) 06/05/10   result rhabdomyolosis and ARF requrining dialysis     Past  Surgical History:  Procedure Laterality Date  . ANKLE ARTHROSCOPY Right 08/17/2015   Procedure: RIGHT ANKLE ARTHROSCOPY WITH SYNOVECTOMY AND LOOSE BODY EXCISION;  Surgeon: Melrose Nakayama, MD;  Location: [facility];  Service: Orthopedics;  Laterality: Right;  . CHOLECYSTECTOMY    . TUBAL LIGATION  1998    Family Psychiatric History: Unknown  Family History:  Family History  Problem Relation Age of Onset  . Asthma Father     Social History:  Social History   Socioeconomic History  . Marital status: Married    Spouse name: Not on file  . Number of children: 2  . Years of education: Not on file  . Highest education level: Not on file  Occupational History  . Occupation: Corporate treasurer  Tobacco Use  . Smoking status: Never Smoker  . Smokeless tobacco: Never Used  Vaping Use  . Vaping Use: Never used  Substance and Sexual Activity  . Alcohol use: No  . Drug use: No  . Sexual activity: Yes    Comment: with husband   Other Topics Concern  . Not on file  Social History Narrative   Married high school boyfriend at age 10, two boys, still married but he has been living with other women for years.   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 Ace Inhibitors Other (See Comments)    Patient had acute renal failure requiring hemodialysis after a suicide attempt.  Nephro recommended avoiding use.  . Haldol [Haloperidol Lactate] Other (See Comments)    Tardive dyskinesia  . Nsaids Other (See Comments)    Nephro recommended avoiding after acute renal failure requiring hemodialysis associated with a suicide attempt.  . Latex Other (See Comments)     Metabolic Disorder Labs: Lab Results  Component Value Date   HGBA1C 11.7 (A) 11/21/2019   MPG 243.17 11/14/2017   MPG 186 05/09/2015   No results found for: PROLACTIN Lab Results  Component Value Date   CHOL 195 07/13/2019   TRIG 251 (H) 07/13/2019   HDL 52 07/13/2019   CHOLHDL 3.8 07/13/2019   VLDL 46 (H) 04/10/2015   LDLCALC 101 (H) 07/13/2019   LDLCALC 55 04/10/2015   Lab Results  Component Value Date   TSH 3.247 11/13/2017   TSH 2.016 03/14/2017    Therapeutic Level Labs: No results found for: LITHIUM No results found for: VALPROATE No components found for:  CBMZ  Current Medications: Current Outpatient Medications  Medication Sig Dispense Refill  . albuterol (VENTOLIN HFA) 108 (90 Base) MCG/ACT inhaler INHALE 2 PUFFS BY MOUTH EVERY 6 HOURS AS NEEDED FOR  WHEEZING FOR SHORTNESS OF BREATH 18 g 0  . aspirin EC 81 MG tablet Take 81 mg by mouth daily.    Marland Kitchen atorvastatin (LIPITOR) 40 MG tablet Take 1 tablet (40 mg total) by mouth daily. (Patient not taking: Reported on 10/14/2019) 90 tablet 3  . Cariprazine HCl 6 MG CAPS Take 1 capsule (6 mg total) by mouth daily. 30 capsule 2  . diclofenac Sodium (VOLTAREN) 1 % GEL Apply 2 g topically 4 (four) times daily. 100 g 1  . empagliflozin (JARDIANCE) 25 MG TABS tablet Take 1 tablet (25 mg total) by mouth daily. 30 tablet 3  . ferrous sulfate 325 (65 FE) MG tablet Take 1 tablet (325 mg total) by mouth 3 (three) times daily with meals. (Patient taking differently: Take 325 mg by mouth daily. Take every other day with stool softer) 90 tablet 3  . FLUoxetine (PROZAC) 40 MG capsule Take 2 capsules (80 mg total) by mouth daily. 60 capsule 2  . fluticasone (FLONASE) 50 MCG/ACT nasal spray Place 1 spray into both nostrils daily. 1 spray in each nostril every day 16 g 1  . hydrOXYzine (VISTARIL) 50 MG capsule Take 1 capsule (50 mg total) by mouth 3 (three) times daily as needed for itching. 90 capsule 2  . Insulin Glargine (BASAGLAR  KWIKPEN) 100 UNIT/ML Take 35U twice a day 15 mL 6  . ipratropium-albuterol (DUONEB) 0.5-2.5 (3) MG/3ML SOLN Take 3 mLs by nebulization 3 (three) times daily as needed. 360 mL 3  . levothyroxine (SYNTHROID) 300 MCG tablet Take 1 tablet (300 mcg total) by mouth daily. 90 tablet 3  . lisinopril (ZESTRIL) 5 MG tablet Take 1 tablet by mouth once daily 30 tablet 0  . LORazepam (ATIVAN) 1 MG tablet TAKE 1 TABLET BY MOUTH EVERY 8 HOURS AS NEEDED FOR ANXIETY **  DO  NOT  FILL  LESS  THAN  30  DAYS  FROM  LAST  REFILL** 45 tablet 0  . Melatonin 3 MG CAPS Take 1 capsule (3 mg total) by mouth at bedtime. 60 capsule 2  . metFORMIN (GLUCOPHAGE) 1000 MG tablet TAKE 1 TABLET BY MOUTH TWICE DAILY WITH A MEAL 60 tablet 0  . metoprolol succinate (TOPROL-XL) 25 MG 24 hr tablet Take 1 tablet by mouth once daily 30 tablet 0  . mirtazapine (REMERON) 30 MG tablet Take 1 tablet (30 mg total) by mouth daily. 30 tablet 2  . OZEMPIC, 1 MG/DOSE, 4 MG/3ML SOPN INJECT 1 MG SUBCUTANEOUSLY ONCE A WEEK 3 mL 0  . pantoprazole (PROTONIX) 40 MG tablet Take 1 tablet (40 mg total) by mouth daily. 30 tablet 3  . Semaglutide,0.25 or 0.'5MG'$ /DOS, (OZEMPIC, 0.25 OR 0.5 MG/DOSE,) 2 MG/1.5ML SOPN Inject 1 mg into the skin once a week. 3 mL 2  . Vitamin D, Ergocalciferol, (DRISDOL) 50000 units CAPS capsule Take 1 capsule (50,000 Units total) by mouth every 7 (seven) days. Sunday (Patient not taking: Reported on 10/14/2019) 12 capsule 0   No current facility-administered medications for this visit.     Musculoskeletal: Strength & Muscle Tone: Unable to assess patient unable to log in virtually. Visit on telephone Gait & Station: Unable to assess patient unable to log in virtually. Visit on telephone Patient leans: N/A  Psychiatric Specialty Exam: Review of Systems  There were no vitals taken for this visit.There is no height or weight on file to calculate BMI.  General Appearance: Unable to assess patient unable to log in virtually. Visit  on telephone  Eye  Contact:  Unable to assess patient unable to log in virtually. Visit on telephone  Speech:  Clear and Coherent and Normal Rate  Volume:  Normal  Mood:  Anxious and Depressed  Affect:  Appropriate and Congruent  Thought Process:  Coherent, Goal Directed and Linear  Orientation:  Full (Time, Place, and Person)  Thought Content: WDL and Logical   Suicidal Thoughts:  No  Homicidal Thoughts:  No  Memory:  Immediate;   Good Recent;   Good Remote;   Good  Judgement:  Good  Insight:  Good  Psychomotor Activity:  Normal  Concentration:  Concentration: Good and Attention Span: Good  Recall:  Good  Fund of Knowledge: Good  Language: Good  Akathisia:  No  Handed:  Right  AIMS (if indicated): Not done  Assets:  Communication Skills Desire for Improvement Financial Resources/Insurance Housing Social Support  ADL's:  Intact  Cognition: WNL  Sleep:  Good   Screenings: GAD-7   Flowsheet Row Video [facility] 06/07/2020 in [facility]  Total GAD-7 Score 21    PHQ2-9   Flowsheet Row Video Visit from 06/07/2020 in [facility] Office Visit from 03/01/2020 in [facility] Office Visit from 01/06/2020 in [facility] Office Visit from 11/21/2019 in [facility] [place] Office Visit from 10/14/2019 in [place]  PHQ-2 Total Score '3 1 2 4 2  '$ PHQ-9 Total Score '11 5 8 17 7    '$ Flowsheet Row Video Visit from 06/07/2020 in [place] Error: Q7 should not be populated when Q6 is No       Assessment and Plan: Patient endorses symptoms of anxiety and depression due to life stressors.  Today she is agreeable to increasing hydroxyzine 25 mg 3 times daily to 50 mg 3 times daily to help manage anxiety.  She is also agreeable to increasing mirtazapine 15 mg daily to 30 mg daily to help manage anxiety and  depression.  Provider recommended patient talking to her therapist however she notes that she feels that she is too broken to see a therapist.  She notes that she may consider this in the future.  1. Generalized anxiety disorder  Continue - FLUoxetine (PROZAC) 40 MG capsule; Take 2 capsules (80 mg total) by mouth daily.  Dispense: 60 capsule; Refill: 2 Continue- hydrOXYzine (VISTARIL) 50 MG capsule; Take 1 capsule (50 mg total) by mouth 3 (three) times daily as needed for itching.  Dispense: 90 capsule; Refill: 2   2. Moderate episode of recurrent major depressive disorder (HCC)  Continue- FLUoxetine (PROZAC) 40 MG capsule; Take 2 capsules (80 mg total) by mouth daily.  Dispense: 60 capsule; Refill: 2 Continue- mirtazapine (REMERON) 30 MG tablet; Take 1 tablet (30 mg total) by mouth daily.  Dispense: 30 tablet; Refill: 2  3. Bipolar I disorder, most recent episode depressed (HCC)  Continue- Melatonin 3 MG CAPS; Take 1 capsule (3 mg total) by mouth at bedtime.  Dispense: 60 capsule; Refill: 2 Continue- Cariprazine HCl 6 MG CAPS; Take 1 capsule (6 mg total) by mouth daily.  Dispense: 30 capsule; Refill: 2   Follow-up in 3 months   Salley Slaughter, NP 06/07/2020, 10:46 AM

## 2020-06-21 ENCOUNTER — Ambulatory Visit (INDEPENDENT_AMBULATORY_CARE_PROVIDER_SITE_OTHER): Payer: Self-pay | Admitting: Family Medicine

## 2020-06-21 ENCOUNTER — Other Ambulatory Visit: Payer: Self-pay

## 2020-06-21 ENCOUNTER — Encounter: Payer: Self-pay | Admitting: Family Medicine

## 2020-06-21 VITALS — BP 132/77 | HR 98 | Wt >= 6400 oz

## 2020-06-21 DIAGNOSIS — F332 Major depressive disorder, recurrent severe without psychotic features: Secondary | ICD-10-CM

## 2020-06-21 DIAGNOSIS — E118 Type 2 diabetes mellitus with unspecified complications: Secondary | ICD-10-CM

## 2020-06-21 DIAGNOSIS — M25561 Pain in right knee: Secondary | ICD-10-CM

## 2020-06-21 DIAGNOSIS — J4541 Moderate persistent asthma with (acute) exacerbation: Secondary | ICD-10-CM

## 2020-06-21 DIAGNOSIS — M25569 Pain in unspecified knee: Secondary | ICD-10-CM

## 2020-06-21 LAB — POCT GLYCOSYLATED HEMOGLOBIN (HGB A1C): HbA1c, POC (controlled diabetic range): 10.4 % — AB (ref 0.0–7.0)

## 2020-06-21 MED ORDER — ALBUTEROL SULFATE HFA 108 (90 BASE) MCG/ACT IN AERS: 2.0000 | INHALATION_SPRAY | RESPIRATORY_TRACT | 0 refills | Status: DC | PRN

## 2020-06-21 MED ORDER — METHYLPREDNISOLONE ACETATE 80 MG/ML IJ SUSP
40.0000 mg | Freq: Once | INTRAMUSCULAR | Status: AC
  Administered 2020-06-21: 40 mg via INTRAMUSCULAR

## 2020-06-21 MED ORDER — BASAGLAR KWIKPEN 100 UNIT/ML ~~LOC~~ SOPN
50.0000 [IU] | PEN_INJECTOR | Freq: Two times a day (BID) | SUBCUTANEOUS | 6 refills | Status: DC
Start: 2020-06-21 — End: 2020-12-20

## 2020-06-21 MED ORDER — OZEMPIC (1 MG/DOSE) 4 MG/3ML ~~LOC~~ SOPN: PEN_INJECTOR | SUBCUTANEOUS | 6 refills | Status: DC

## 2020-06-21 NOTE — Assessment & Plan Note (Signed)
Patient presents today complaining of 1 month of right knee pain.  This is likely a result of morbid obesity and concurrent osteoarthritis.  Right knee injection performed today, procedure note below.

## 2020-06-21 NOTE — Patient Instructions (Addendum)
It was wonderful to see you today.  Please bring ALL of your medications with you to every visit.   Today we talked about:  -I performed a steroid injection into your right knee. This may help with the pain you were experiencing though it is hard to say how long it may help for.   -Please keep a close eye on your blood sugars for the next couple days. The steroid injection may cause them to be slightly more elevated for the next couple days. If they get into the 400's-500's increase your insulin by 2U. Call the on-call provider if you have any concerns.   -I scheduled you an appointment with Dr. Valentina Lucks to do pulmonary function test at 3:30PM on 5/5.    Thank you for choosing [facility].   Please call [phone] with any questions about today's appointment.  Please be sure to schedule follow up at the front  desk before you leave today.   [name], DO PGY-1 Family Medicine    Joint Steroid Injection A joint steroid injection is a procedure to relieve swelling and pain in a joint. Steroids are medicines that reduce inflammation. In this procedure, your health care provider uses a syringe and a needle to inject a steroid medicine into a painful and inflamed joint. A pain-relieving medicine (anesthetic) may be injected along with the steroid. In some cases, your health care provider may use an imaging technique such as ultrasound or fluoroscopy to guide the injection. Joints that are often treated with steroid injections include the knee, shoulder, hip, and spine. These injections may also be used in the elbow, ankle, and joints of the hands or feet. You may have joint steroid injections as part of your treatment for inflammation caused by:  Gout.  Rheumatoid arthritis.  Advanced wear-and-tear arthritis (osteoarthritis).  Tendinitis.  Bursitis. Joint steroid injections may be repeated, but having them too often can damage a joint or the skin over the joint.  You should not have joint steroid injections less than 6 weeks apart or more than four times a year. Tell a health care provider about:  Any allergies you have.  All medicines you are taking, including vitamins, herbs, eye drops, creams, and over-the-counter medicines.  Any problems you or family members have had with anesthetic medicines.  Any blood disorders you have.  Any surgeries you have had.  Any medical conditions you have.  Whether you are pregnant or may be pregnant. What are the risks? Generally, this is a safe treatment. However, problems may occur, including:  Infection.  Bleeding.  Allergic reactions to medicines.  Damage to the joint or tissues around the joint.  Thinning of skin or loss of skin color over the joint.  Temporary flushing of the face or chest.  Temporary increase in pain.  Temporary increase in blood sugar.  Failure to relieve inflammation or pain. What happens before the treatment? Medicines Ask your health care provider about:  Changing or stopping your regular medicines. This is especially important if you are taking diabetes medicines or blood thinners.  Taking medicines such as aspirin and ibuprofen. These medicines can thin your blood. Do not take these medicines unless your health care provider tells you to take them.  Taking over-the-counter medicines, vitamins, herbs, and supplements. General instructions  You may have imaging tests of your joint.  Ask your health care provider if you can drive yourself home after the procedure. What happens during the treatment?  Your health care  provider will position you for the injection and locate the injection site over your joint.  The skin over the joint will be cleaned with a germ-killing soap.  Your health care provider may: ? Spray a numbing solution (topical anesthetic) over the injection site. ? Inject a local anesthetic under the skin above your joint.  The needle will be  placed through your skin into your joint. Your health care provider may use imaging to guide the needle to the right spot for the injection. If imaging is used, a special contrast dye may be injected to confirm that the needle is in the correct location.  The steroid medicine will be injected into your joint.  Anesthetic may be injected along with the steroid. This may be a medicine that relieves pain for a short time (short-acting anesthetic) or for a longer time (long-acting anesthetic).  The needle will be removed, and an adhesive bandage (dressing) will be placed over the injection site. The procedure may vary among health care providers and hospitals.   What can I expect after the treatment?  You will be able to go home after the treatment.  It is normal to feel slight flushing for a few days after the injection.  After the treatment, it is common to have an increase in joint pain after the anesthetic has worn off. This may happen about an hour after a short-acting anesthetic or about 8 hours after a longer-acting anesthetic.  You should begin to feel relief from joint pain and swelling after 24 to 48 hours. Contact your health care provider if you do not begin to feel relief after 2 days. Follow these instructions at home: Injection site care  Leave the adhesive dressing over your injection site in place until your health care provider says you can remove it.  Check your injection site every day for signs of infection. Check for: ? More redness, swelling, or pain. ? Fluid or blood. ? Warmth. ? Pus or a bad smell. Activity  Return to your normal activities as told by your health care provider. Ask your health care provider what activities are safe for you. You may be asked to limit activities that put stress on the joint for a few days.  Do joint exercises as told by your health care provider.  Do not take baths, swim, or use a hot tub until your health care provider approves.  Ask your health care provider if you may take showers. You may only be allowed to take sponge baths. Managing pain, stiffness, and swelling  If directed, put ice on the joint. To do this: ? Put ice in a plastic bag. ? Place a towel between your skin and the bag. ? Leave the ice on for 20 minutes, 2-3 times a day. ? Remove the ice if your skin turns bright red. This is very important. If you cannot feel pain, heat, or cold, you have a greater risk of damage to the area.  Raise (elevate) your joint above the level of your heart when you are sitting or lying down.   General instructions  Take over-the-counter and prescription medicines only as told by your health care provider.  Do not use any products that contain nicotine or tobacco, such as cigarettes, e-cigarettes, and chewing tobacco. These can delay joint healing. If you need help quitting, ask your health care provider.  If you have diabetes, be aware that your blood sugar may be slightly elevated for several days after the injection.  Keep all follow-up visits. This is important. Contact a health care provider if you have:  Chills or a fever.  Any signs of infection at your injection site.  Increased pain or swelling or no relief after 2 days. Summary  A joint steroid injection is a treatment to relieve pain and swelling in a joint.  Steroids are medicines that reduce inflammation. Your health care provider may add an anesthetic along with the steroid.  You may have joint steroid injections as part of your arthritis treatment.  Joint steroid injections may be repeated, but having them too often can damage a joint or the skin over the joint.  Contact your health care provider if you have a fever, chills, or signs of infection, or if you get no relief from joint pain or swelling. This information is not intended to replace advice given to you by your health care provider. Make sure you discuss any questions you have with your  health care provider. Document Revised: 08/05/2019 Document Reviewed: 08/05/2019 Elsevier Patient Education  2021 [employer].

## 2020-06-21 NOTE — Assessment & Plan Note (Signed)
Patient has history of MDD and is followed by [facility] behavioral health.  She recently saw them anemia medication adjustments.  Today her PHQ-9 is positive.  She scored a 1 for suicidal thoughts though when asked about this, she denies any active thoughts or plan.  States that this particular time year is especially hard for her as she is mourning the loss of her father and son.  She does appear to have a good support system. Reports that today is actually a good day for her. We discussed safety contract and she states she will call the [facility] center if she is ever in a "dark place" again. For now feels well supported with family, friends and is doing well on her medications. No other adjustments made today.

## 2020-06-21 NOTE — Assessment & Plan Note (Signed)
Patient has a history of type 2 diabetes, is currently taking 1 mg of Ozempic weekly and 50 units twice daily of Basaglar.  Her A1c was improved today at 10.4 from 11.7 on September 13.  We do not have much time today to delve into her diabetes management.  I did send medication refills for Ozempic and Basaglar.  She should return in 3 months for repeat A1c and diabetes management.  She was instructed that her blood sugars may be elevated in the next couple days due to her steroid injection today.  If her sugars are in the 400s to 500s she was instructed to increase her insulin by 2 units.  She can always call the on-call provider if she is having any concerns regarding her blood sugars. -Repeat A1c in 3 months -Lipid panel at follow up  -Urine microalbumin at follow up -Diabetic foot exam at follow up  -Inquire about diabetic eye exam

## 2020-06-21 NOTE — Progress Notes (Signed)
SUBJECTIVE:   CHIEF COMPLAINT / HPI:   Right Knee Pain Patient states that she has had 1 month of knee pain.  States this time "the good knee is hurting".  It is worse when she goes up the stairs.  It is better when she props up her knee and uses Biofreeze to the area.  She has had relief with the injections in her left knee.  She would like a steroid injection in her right knee this time.  Has never had 1 to this knee before.  Asthma She currently takes albuterol and duo nebs as needed for her asthma.  She requests a refill for her albuterol.  States that she has been having difficulty recently and feels that her allergies are acting up.  She has a coworker that mentioned a medication called Singulair.  She is interested in this medication and is wondering if it will help both her allergies and asthma.  States that she has never had PFTs in the past.  Depression Patient reports that this is a particularly difficult time for her as April 11 is the 3-year anniversary of the passing of her father in April 13 is a 6-year anniversary of the passing of her son.  She typically struggles during this time every year.  States that today is "a good day".  Occasionally she feels that she wants to take her life but she never has a plan to do so.  She has a history of suicide attempt in the past for which she required dialysis.  She says that was a very dark time in her life and she is not gone back to that place again, thankfully.  She is followed by [facility] behavioral health and had an appointment with them about 2 weeks ago.  She says at that time the increased a couple of her medications.  She feels well supported by her family and friends.  She often cries, perseverates and occasionally misses work during her difficult times but also thinks of things that bring her joy such as her grandchildren, her husband and her son that need her.  She is able to get through the tough times by thinking of them. Denies SI  today.    PERTINENT  PMH / PSH:  Past Medical History:  Diagnosis Date  . Acute renal failure ([place]) 05/27/10   hemodialysis for 6 weeks  . Anxiety   . Asthma   . Depression   . Diabetes mellitus   . Hypertension   . Rhabdomyolysis 06/06/10   after drug overdose  . Suicide attempt by drug ingestion ([place]) 06/05/10   result rhabdomyolosis and ARF requrining dialysis      OBJECTIVE:   BP 132/77   Pulse 98   Wt (!) 403 lb (182.8 kg)   SpO2 100%   BMI 56.21 kg/m   Gen: Awake, alert, pleasant, cooperative MSK: right knee with pain to medial joint line on palpation, without erythema or effusion.    Depression screen [facility] 2/9 06/21/2020 03/01/2020 01/06/2020  Decreased Interest 1 0 1  Down, Depressed, Hopeless '1 1 1  '$ PHQ - 2 Score '2 1 2  '$ Altered sleeping '1 1 1  '$ Tired, decreased energy '1 1 1  '$ Change in appetite '1 1 1  '$ Feeling bad or failure about yourself  1 0 1  Trouble concentrating '1 1 1  '$ Moving slowly or fidgety/restless 0 0 0  Suicidal thoughts 1 0 1  PHQ-9 Score '8 5 8  '$ Difficult  doing work/chores Somewhat difficult - Very difficult  Some encounter information is confidential and restricted. Go to Review Flowsheets activity to see all data.  Some recent data might be hidden    ASSESSMENT/PLAN:   Right knee pain Patient presents today complaining of 1 month of right knee pain.  This is likely a result of morbid obesity and concurrent osteoarthritis.  Right knee injection performed today, procedure note below.  MDD (major depressive disorder), recurrent severe, without psychosis ([place]) Patient has history of MDD and is followed by [facility] behavioral health.  She recently saw them anemia medication adjustments.  Today her PHQ-9 is positive.  She scored a 1 for suicidal thoughts though when asked about this, she denies any active thoughts or plan.  States that this particular time year is especially hard for her as she is mourning the loss of her father and son.  She does appear  to have a good support system. Reports that today is actually a good day for her. We discussed safety contract and she states she will call the [facility] center if she is ever in a "dark place" again. For now feels well supported with family, friends and is doing well on her medications. No other adjustments made today.  Type II diabetes mellitus with complication ([place]) Patient has a history of type 2 diabetes, is currently taking 1 mg of Ozempic weekly and 50 units twice daily of Basaglar.  Her A1c was improved today at 10.4 from 11.7 on September 13.  We do not have much time today to delve into her diabetes management.  I did send medication refills for Ozempic and Basaglar.  She should return in 3 months for repeat A1c and diabetes management.  She was instructed that her blood sugars may be elevated in the next couple days due to her steroid injection today.  If her sugars are in the 400s to 500s she was instructed to increase her insulin by 2 units.  She can always call the on-call provider if she is having any concerns regarding her blood sugars. -Repeat A1c in 3 months -Lipid panel at follow up  -Urine microalbumin at follow up -Diabetic foot exam at follow up  -Inquire about diabetic eye exam   Procedure Note: Right Knee Injection After informed written consent timeout was performed, patient was seated on exam table. Right knee was prepped with alcohol swab and utilizing anteromedial approach, patient's right knee was injected intraarticularly with 4:1 lidocaine: depomedrol. Patient tolerated the procedure well without immediate complications.     [name], [place]

## 2020-06-21 NOTE — Assessment & Plan Note (Signed)
>>  ASSESSMENT AND PLAN FOR RIGHT KNEE PAIN WRITTEN ON 06/21/2020  5:51 PM BY Sabino Dick, DO  Patient presents today complaining of 1 month of right knee pain.  This is likely a result of morbid obesity and concurrent osteoarthritis.  Right knee injection performed today, procedure note below.

## 2020-07-09 ENCOUNTER — Other Ambulatory Visit: Payer: Self-pay | Admitting: *Deleted

## 2020-07-09 DIAGNOSIS — M1712 Unilateral primary osteoarthritis, left knee: Secondary | ICD-10-CM

## 2020-07-09 DIAGNOSIS — M25571 Pain in right ankle and joints of right foot: Secondary | ICD-10-CM

## 2020-07-12 ENCOUNTER — Ambulatory Visit: Payer: Self-pay | Admitting: Pharmacist

## 2020-07-17 ENCOUNTER — Other Ambulatory Visit: Payer: Self-pay

## 2020-07-17 DIAGNOSIS — F411 Generalized anxiety disorder: Secondary | ICD-10-CM

## 2020-07-17 MED ORDER — LORAZEPAM 1 MG PO TABS: ORAL_TABLET | ORAL | 0 refills | Status: DC

## 2020-07-25 ENCOUNTER — Encounter (HOSPITAL_COMMUNITY): Payer: Self-pay | Admitting: Registered Nurse

## 2020-07-25 ENCOUNTER — Encounter: Payer: Self-pay | Admitting: Family Medicine

## 2020-07-25 ENCOUNTER — Other Ambulatory Visit: Payer: Self-pay

## 2020-07-25 ENCOUNTER — Ambulatory Visit (INDEPENDENT_AMBULATORY_CARE_PROVIDER_SITE_OTHER): Payer: BC Managed Care – PPO | Admitting: Family Medicine

## 2020-07-25 ENCOUNTER — Ambulatory Visit (HOSPITAL_COMMUNITY)
Admission: EM | Admit: 2020-07-25 | Discharge: 2020-07-25 | Disposition: A | Discharge status: Home / Self Care | Payer: BC Managed Care – PPO

## 2020-07-25 VITALS — BP 140/106 | HR 95 | Ht 71.0 in | Wt >= 6400 oz

## 2020-07-25 DIAGNOSIS — M19071 Primary osteoarthritis, right ankle and foot: Secondary | ICD-10-CM

## 2020-07-25 DIAGNOSIS — R45851 Suicidal ideations: Secondary | ICD-10-CM

## 2020-07-25 DIAGNOSIS — F332 Major depressive disorder, recurrent severe without psychotic features: Secondary | ICD-10-CM | POA: Diagnosis present

## 2020-07-25 DIAGNOSIS — F411 Generalized anxiety disorder: Secondary | ICD-10-CM

## 2020-07-25 DIAGNOSIS — F331 Major depressive disorder, recurrent, moderate: Secondary | ICD-10-CM

## 2020-07-25 NOTE — Patient Instructions (Signed)
Thank you for coming to see me today. It was a pleasure. Today we talked about:   You should go directly to the [facility].  You can use Over the counter voltaren gel for you ankle and take tylenol.   Please follow-up with PCP in 1-2 weeks.  If you have any questions or concerns, please do not hesitate to call the office at [phone].  Best,   [place] Constable, DO

## 2020-07-25 NOTE — Assessment & Plan Note (Signed)
Discussed with Dr. Nori Riis and Dr. Hartford Poli.  Patient is willing to go to behavioral health urgent care and husband plans to take her.  Called and spoke with Ava at [facility] health behavioral urgent care and advised them of patient's presentation and that she will be coming directly to urgent care.  They will be watching out for the patient.  Advised that they need to go directly there and to not stop anywhere else.  Husband reports that he will take her.  Attempted to schedule follow-up with patient in 1 week, however she would like to call back.  Husband ensures that this will occur.  She was discharged home and her husband's care to go directly to urgent care.

## 2020-07-25 NOTE — BH Assessment (Signed)
Per Dr Alwyn Pea patient [name] status will be changed to routine , Provider has seen patient and she does not need TTS assessment.

## 2020-07-25 NOTE — BH Assessment (Signed)
New patient in lobby [name] . Patient reports SI/ with no plan / denies HI / AVH/ or substance use. Patient attempted 2012 by OD Patient sees Burt Ek last visit was week of 4/1. Patient reports Mother's day was a trigger for patient due to passing off her mother 12 years ago and her son as well. Patient is urgent
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
## 2020-07-25 NOTE — Discharge Instructions (Addendum)
Patient is instructed to take all prescribed medications as recommended. Report any side effects or adverse reactions to your outpatient psychiatrist. Patient is instructed to abstain from alcohol and illegal drugs while on prescription medications. In the event of worsening symptoms, patient is instructed to call the crisis hotline, 911, or go to the nearest emergency department for evaluation and treatment.    Please call PCP for evaluation of thyroid hormone level, Hemoglobin A1c and Lipid testing. Please refill your synthroid medication.

## 2020-07-25 NOTE — Progress Notes (Signed)
    SUBJECTIVE:   CHIEF COMPLAINT / HPI:   Suicidal Ideation Started having thoughts yesterday of hurting herself Would "take a bunch of pills, " cut herself with a knife  Also had the thought to herself yesterday that she wishes that they had a gun so that she could shoot herself, states that she does not have access to any firearms Has a history of bipolar disorder with anxiety and depression She is on multiple psychiatric medications and is compliant with these She reports that she told her husband about her thoughts yesterday and he wanted her to go to the [facility] health behavioral urgent care, but she did not She currently follows with them regularly She reports that earlier today she also had these thoughts of harming herself, denies any at present She reports that the trigger occurred about 2 weeks ago on Mother's Day She lost her son 6 years ago and her mother 12 years ago She states that this is the worst that she has felt   Right ankle pain Has been seen by sports medicine and had x-rays in September that showed worsening degenerative arthritis of her right ankle She was referred to Dr. Lucia Gaskins She is scheduled for physical therapy She is wondering if there is any other medication that she can take for pain    PERTINENT  PMH / PSH: HTN, hypothyroidism, T2DM, OA, CKD stage III, morbid obesity, bipolar disorder, anxiety, depression, HLD, history of suicidal ideation   OBJECTIVE:   BP (!) 140/106   Pulse 95   Ht '5\' 11"'$  (1.803 m)   Wt (!) 400 lb 6.4 oz (181.6 kg)   SpO2 95%   BMI 55.84 kg/m    Physical Exam:  General: 50 y.o. female, intermittently tearful Lungs: Breathing comfortably on room air Skin: warm and dry Psych: Tearful mood, flat affect, good insight, endorses suicidal ideation with plan, although not at present  Husband was present for encounter   ASSESSMENT/PLAN:   Suicidal ideation Discussed with Dr. Nori Riis and Dr. Hartford Poli.  Patient is willing to go to  behavioral health urgent care and husband plans to take her.  Called and spoke with Ava at [facility] health behavioral urgent care and advised them of patient's presentation and that she will be coming directly to urgent care.  They will be watching out for the patient.  Advised that they need to go directly there and to not stop anywhere else.  Husband reports that he will take her.  Attempted to schedule follow-up with patient in 1 week, however she would like to call back.  Husband ensures that this will occur.  She was discharged home and her husband's care to go directly to urgent care.  Arthritis of right ankle This is a chronic issue.  Exam was deferred given patient's more pressing issues.  Did tell her that she can take Tylenol and Voltaren gel as needed for her pain.  She is already following with orthopedics and scheduled for physical therapy.  She can follow-up for this in the future.     Cleophas Dunker, [place]

## 2020-07-25 NOTE — Assessment & Plan Note (Signed)
This is a chronic issue.  Exam was deferred given patient's more pressing issues.  Did tell her that she can take Tylenol and Voltaren gel as needed for her pain.  She is already following with orthopedics and scheduled for physical therapy.  She can follow-up for this in the future.

## 2020-07-26 ENCOUNTER — Telehealth: Payer: Self-pay

## 2020-07-26 NOTE — Telephone Encounter (Signed)
Patient calls nurse line requesting extended work note for today and tomorrow for issues with ankle pain. Patient reports that she is still having issues with pain and putting all her weight on right ankle.   Please advise.   Talbot Grumbling, RN  .

## 2020-07-27 ENCOUNTER — Encounter: Payer: Self-pay | Admitting: Family Medicine

## 2020-07-27 NOTE — Telephone Encounter (Signed)
Letter printed.  In chart and also left up front for pick up.  Can you please let the patient know?  Thanks

## 2020-07-30 NOTE — Telephone Encounter (Signed)
Called patient and informed.   Dquan Cortopassi C Lindalou Soltis, RN  

## 2020-08-02 ENCOUNTER — Ambulatory Visit: Payer: BC Managed Care – PPO

## 2020-08-09 ENCOUNTER — Encounter: Payer: Self-pay | Admitting: Pharmacist

## 2020-08-09 ENCOUNTER — Other Ambulatory Visit: Payer: Self-pay

## 2020-08-09 ENCOUNTER — Ambulatory Visit (INDEPENDENT_AMBULATORY_CARE_PROVIDER_SITE_OTHER): Payer: BC Managed Care – PPO | Admitting: Pharmacist

## 2020-08-09 VITALS — BP 142/90 | HR 104 | Ht 71.0 in | Wt 399.0 lb

## 2020-08-09 DIAGNOSIS — E118 Type 2 diabetes mellitus with unspecified complications: Secondary | ICD-10-CM

## 2020-08-09 DIAGNOSIS — J4541 Moderate persistent asthma with (acute) exacerbation: Secondary | ICD-10-CM | POA: Diagnosis not present

## 2020-08-09 DIAGNOSIS — R0600 Dyspnea, unspecified: Secondary | ICD-10-CM | POA: Diagnosis not present

## 2020-08-09 MED ORDER — INSULIN ASPART 100 UNIT/ML FLEXPEN: PEN_INJECTOR | SUBCUTANEOUS | 11 refills | Status: DC

## 2020-08-09 MED ORDER — BUDESONIDE-FORMOTEROL FUMARATE 80-4.5 MCG/ACT IN AERO: 2.0000 | INHALATION_SPRAY | Freq: Two times a day (BID) | RESPIRATORY_TRACT | 12 refills | Status: DC

## 2020-08-09 MED ORDER — METFORMIN HCL ER 500 MG PO TB24: 1000.0000 mg | ORAL_TABLET | Freq: Two times a day (BID) | ORAL | 11 refills | Status: DC

## 2020-08-09 MED ORDER — OZEMPIC (2 MG/DOSE) 8 MG/3ML ~~LOC~~ SOPN: 2.0000 mg | PEN_INJECTOR | SUBCUTANEOUS | 11 refills | Status: DC

## 2020-08-09 NOTE — Patient Instructions (Addendum)
It was nice to see you today!  Your goal blood sugar is 80-130 before eating and less than 180 after eating.  Medication Changes:  Start Symbicort 80-4.5 mcg - 2 puffs twice daily and as needed for SOB/wheezing. Remember to rinse and spit with water after each use.  Start taking Novolog 10 units with breakfast and 15 units with dinner  Start taking Metformin XR 1000 mg twice daily - 2 tabs of 500 mg twice daily with meals  Increase Ozempic (semaglutide) to 2 mg weekly  Continue Basaglar 50 units twice daily  We will call you in 1 week to see how you are doing.  See you in clinic in 3 weeks!  Monitor blood sugars at home and keep a log (glucometer or piece of paper) to bring with you to your next visit.  Keep up the good work with diet and exercise. Aim for a diet full of vegetables, fruit and lean meats (chicken, [place], fish). Try to limit salt intake by eating fresh or frozen vegetables (instead of canned), rinse canned vegetables prior to cooking and do not add any additional salt to meals.

## 2020-08-09 NOTE — Progress Notes (Signed)
S:     Patient arrives in good spirits. Presents for lung function evaluation.   Patient was referred and last seen by Primary Care Provider on 06/21/20. Of note, seen in ED on 5/18 with suicidal ideation.   Patient reports asthma was diagnosed as an adult. Reports frequent SOB (1-2x/daily) primarily with daily activities. Rates breathing a 4 out of 10 today. Denies tobacco use, however husband is a tobacco smoker who smokes in the house. Reports atopic sx consistent with allergic rhinitis. Additionally, pt reports blood sugars have been 400-500s for the last 2-3 weeks. Reports polyuria, polydipsia, headaches, and dizziness due to elevated sugars. Reports medication adherence with Basaglar (insulin glargine) 50 units twice daily and Ozempic (semaglutide) 2 mg weekly. Denied medication adherence with metformin 1000 mg twice daily due to diarrhea.   Patient reports adherence to medications Patient reports last dose of asthma medications was yesterday Current asthma medications: albuterol PRN, Duoneb PRN Rescue inhaler use frequency: 2-3x/week  Level of asthma sx control- in the last 4 weeks: Question Scoring Patient Score  Daytime sx more than twice a week Yes (1) No (0) Yes  Any nighttime waking due to asthma Yes (1) No (0) Yes  Reliever needed >2x/week Yes (1) No (0) Yes  Any activity limitation due to asthma Yes (1) No (0) Yes   Total Score 4  Well controlled - 0, partly controlled - 1-2, uncontrolled 3-4  O: Physical Exam Vitals reviewed.  Constitutional:      Appearance: She is obese.  Pulmonary:     Effort: Pulmonary effort is normal.  Neurological:     Mental Status: She is alert.  Psychiatric:        Mood and Affect: Mood normal.    Review of Systems  Genitourinary: Positive for frequency ( Polyuria ).  All other systems reviewed and are negative.  Vitals:   08/09/20 1642  BP: (!) 142/90  Pulse: (!) 104  SpO2: 95%    See "scanned report" or Documentation  Flowsheet (discrete results - PFTs) for  Spirometry results. Patient provided good effort while attempting spirometry.   Lung Age = 89 Albuterol Neb  Lot# [id number]     Exp. 10/2021  PHQ-9 Score: 1   A/P: Patient with a history of asthma experiencing SOB with daily activites for weeks and taking albuterol 2-3x/week for symptom relief. Spirometry evaluation reveals possible restrictive lung disease. Post nebulized albuterol tx revealed no significant pre-post change. Patient may benefit from a maintenance inhaler to provide consistent symptom relief reduce use of rescue inhaler and decrease frequency of nocturnal awakening for respiratory symptoms. .  -Started Symbicort (budesonide/formoterol) 80-4.5 mcg - 2 puffs twice daily and as needed for SOB/wheezing -Continued albuterol as needed for SOB/Wheezing -Educated patient on purpose, proper use, potential adverse effects including Symbicort's risk of esophageal candidiasis and need to rinse mouth after each use.  -Reviewed results of pulmonary function tests.   Diabetes longstanding currently uncontrolled. Medication adherence appears optimal. Home sugars >400 for the last two weeks. Denies signs of infection. Discussed with Dr. Nita Sells and agreed to start rapid acting insulin with meals. Patient is now able to appropriately manage hypoglycemia.  -Started Novolog (insulin aspart) 10 units with lunch and 15 units with dinner -Increased Ozempic (semaglutide) from 1 mg to 2 mg weekly -Transitioned metfomin IR to metformin XR 1000 mg twice daily to reduce risk of diarrhea  -Continued Basaglar (insulin glargine) 50 units twice daily  Patient verbalized understanding of results and education.  Written pt instructions provided.   F/U call in 1 week. F/U Rx Clinic visit 3 weeks  Total time in face to face counseling 35 minutes. Patient seen with Caren Macadam PharmD Candidate, Romilda Garret, PharmD - PGY-1 Resident and Lorel Monaco, PharmD, [place] Resident.

## 2020-08-10 NOTE — Progress Notes (Signed)
Reviewed: I agree with Dr. Koval's documentation and management. 

## 2020-08-10 NOTE — Assessment & Plan Note (Signed)
Patient with a history of asthma experiencing SOB with daily activites for weeks and taking albuterol 2-3x/week for symptom relief. Spirometry evaluation reveals possible restrictive lung disease. Post nebulized albuterol tx revealed no significant pre-post change. Patient may benefit from a maintenance inhaler to provide consistent symptom relief reduce use of rescue inhaler and decrease frequency of nocturnal awakening for respiratory symptoms. .  -Started Symbicort (budesonide/formoterol) 80-4.5 mcg - 2 puffs twice daily and as needed for SOB/wheezing -Continued albuterol as needed for SOB/Wheezing -Educated patient on purpose, proper use, potential adverse effects including Symbicort's risk of esophageal candidiasis and need to rinse mouth after each use.  -Reviewed results of pulmonary function tests.

## 2020-08-10 NOTE — Assessment & Plan Note (Signed)
Diabetes longstanding currently uncontrolled. Medication adherence appears optimal. Home sugars >400 for the last two weeks. Denies signs of infection. Discussed with Dr. Nita Sells and agreed to start rapid acting insulin with meals. Patient is now able to appropriately manage hypoglycemia.  -Started Novolog (insulin aspart) 10 units with lunch and 15 units with dinner -Increased Ozempic (semaglutide) from 1 mg to 2 mg weekly -Transitioned metfomin IR to metformin XR 1000 mg twice daily to reduce risk of diarrhea  -Continued Basaglar (insulin glargine) 50 units twice daily

## 2020-08-16 ENCOUNTER — Telehealth: Payer: Self-pay | Admitting: Pharmacist

## 2020-08-16 NOTE — Telephone Encounter (Signed)
-----   Message from Leavy Cella, [place] sent at 08/09/2020  5:16 PM EDT ----- Regarding: DM f/u

## 2020-08-16 NOTE — Telephone Encounter (Signed)
Phone follow-up of elevated blood sugars at PFT/Rx visit 1 week ago, resulting in initiation of prandial insulin.   No answer.  Left message requesting call back.   We will attempt to call again if patient does not return call today.

## 2020-08-16 NOTE — Telephone Encounter (Signed)
Noted and agree. 

## 2020-08-17 ENCOUNTER — Telehealth: Payer: Self-pay | Admitting: Pharmacy Technician

## 2020-08-20 NOTE — Telephone Encounter (Signed)
Left an additional voicemail for patient to call back for DM follow-up. Patient has an appointment for follow-up with pharmacy clinic on June 23.

## 2020-08-21 ENCOUNTER — Other Ambulatory Visit: Payer: Self-pay | Admitting: Family Medicine

## 2020-08-21 DIAGNOSIS — F411 Generalized anxiety disorder: Secondary | ICD-10-CM

## 2020-08-27 ENCOUNTER — Telehealth: Payer: Self-pay

## 2020-08-27 NOTE — Telephone Encounter (Signed)
Received fax from pharmacy, PA needed on Ozempic.  Clinical questions submitted via Cover My Meds.  Waiting on response, could take up to 72 hours.  Cover My Meds info: Key: [id number]  Talbot Grumbling, RN

## 2020-08-28 NOTE — Telephone Encounter (Signed)
Received determination from insurance regarding Ozempic rx. Medication was denied. Please see below from denial letter.   Coverage for this medication is denied for the following reason(s). We reviewed the information we received about your condition and circumstances. The policy states that this medication may be approved when: - The member has tried and had an inadequate response or adverse effect to the required number of formulary alternatives, OR - The member is unable to take the required number of formulary alternatives because they have a contraindication to all alternatives, OR - The requested medication is the only FDA-approved product to treat the patient's diagnosis.  Available formulary alternatives: Victoza, Ozempic (Excluding Ozempic 2 mg pen - '8mg'$ [id number]) , Trulicity  Please advise.   Talbot Grumbling, RN

## 2020-08-30 ENCOUNTER — Telehealth: Payer: Self-pay | Admitting: Pharmacist

## 2020-08-30 ENCOUNTER — Encounter (HOSPITAL_COMMUNITY): Payer: Self-pay | Admitting: Psychiatry

## 2020-08-30 ENCOUNTER — Other Ambulatory Visit: Payer: Self-pay

## 2020-08-30 ENCOUNTER — Ambulatory Visit: Payer: BC Managed Care – PPO | Admitting: Pharmacist

## 2020-08-30 ENCOUNTER — Telehealth (INDEPENDENT_AMBULATORY_CARE_PROVIDER_SITE_OTHER): Payer: BC Managed Care – PPO | Admitting: Psychiatry

## 2020-08-30 DIAGNOSIS — F320 Major depressive disorder, single episode, mild: Secondary | ICD-10-CM | POA: Diagnosis not present

## 2020-08-30 DIAGNOSIS — F411 Generalized anxiety disorder: Secondary | ICD-10-CM | POA: Diagnosis not present

## 2020-08-30 DIAGNOSIS — F32A Depression, unspecified: Secondary | ICD-10-CM

## 2020-08-30 MED ORDER — FLUOXETINE HCL 40 MG PO CAPS: 80.0000 mg | ORAL_CAPSULE | Freq: Every day | ORAL | 2 refills | Status: DC

## 2020-08-30 MED ORDER — MIRTAZAPINE 30 MG PO TABS: 30.0000 mg | ORAL_TABLET | Freq: Every day | ORAL | 2 refills | Status: DC

## 2020-08-30 MED ORDER — HYDROXYZINE PAMOATE 50 MG PO CAPS: 50.0000 mg | ORAL_CAPSULE | Freq: Three times a day (TID) | ORAL | 2 refills | Status: DC | PRN

## 2020-08-30 NOTE — Progress Notes (Signed)
[facility] MD/PA/NP OP Progress Note Virtual Visit via Telephone Note  I connected with [name] on 08/30/20 at  3:30 PM EDT by telephone and verified that I am speaking with the correct person using two identifiers.  Location: Patient: Store Provider: Clinic   I discussed the limitations, risks, security and privacy concerns of performing an evaluation and management service by telephone and the availability of in person appointments. I also discussed with the patient that there may be a patient responsible charge related to this service. The patient expressed understanding and agreed to proceed.   I provided 30 minutes of non-face-to-face time during this encounter.    08/30/2020 4:03 PM [name]  MRN:  [id number]  Chief Complaint: "I feel like the medications are working"  HPI: 50 year old female seen today for follow up psychiatric evaluation.  She has a psychiatric history of OCD, adjustment disorder, anxiety, depression, SI, and bipolar disorder.  She is currently being managed on Prozac 80 mg daily, Vistaril 25 mg three times daily, and mirtazapine 30 mg at bedtime.  She notes that her medications are effective in managing her psychiatric conditions.  Today patient was unable to logon virtually so her assessment was done over the phone.  During exam she was pleasant, engaged in conversation, and cooperative.  She informed Probation officer that she and her husband were about to go to the store to run errands.  She notes that she believes her medications are working.  She informed Probation officer that she had a stressful time during the Mother's Day holiday but is now feeling better.  She also informed Probation officer that she is out at school for the summer and is enjoying her summer break.  She notes prior to leaving school she became really anxious because she became overwhelmed with students. 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   She informed Probation officer that she spoke to her assistant principal and explained that the group of students who she was assigned was weighing on her mental health.  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    She notes that her principal was understanding and informed her that she could work with last severe children in the [facility] classes (exceptional children).  She notes that she will care for children who are higher functioning in an inclusion classroom with other children without psychological or developmental delays.  Today she notes her anxiety and depression are well managed.  Provider conducted a GAD-7 and patient scored a 2, at her last visit she scored 21.  Provider also conducted a PHQ-9 and patient scored a 1, at her last visit she scored an 11.  She endorses adequate sleep and appetite.  Today she denies SI/HI/VAH, mania, or paranoia.  Provider informed patient that Prozac and mirtazapine can cause serotonin syndrome.  She notes that she likes her medication in combination and informed writer that she will notify her if she has any side effects.  At this time no medication changed.  Patient agreeable to continue medication as prescribed.  No other concerns noted at this time.  Visit Diagnosis:    ICD-10-CM   1. Mild depression (HCC)  F32.0 FLUoxetine (PROZAC) 40 MG capsule    mirtazapine (REMERON) 30 MG tablet    2. Generalized anxiety disorder  F41.1 FLUoxetine (PROZAC) 40 MG capsule    hydrOXYzine (VISTARIL) 50 MG capsule  Past Psychiatric History: OCD, adjustment disorder, anxiety, depression, SI, and bipolar disorder.   Past Medical History:  Past Medical History:  Diagnosis Date   Acute renal failure ([place]) 05/27/10   hemodialysis for 6 weeks   Anxiety    Asthma    Depression    Diabetes mellitus    Hypertension    Rhabdomyolysis 06/06/10   after drug overdose   Suicide attempt by drug ingestion ([place])  06/05/10   result rhabdomyolosis and ARF requrining dialysis     Past Surgical History:  Procedure Laterality Date   ANKLE ARTHROSCOPY Right 08/17/2015   Procedure: RIGHT ANKLE ARTHROSCOPY WITH SYNOVECTOMY AND LOOSE BODY EXCISION;  Surgeon: Melrose Nakayama, MD;  Location: [facility];  Service: Orthopedics;  Laterality: Right;   CHOLECYSTECTOMY     TUBAL LIGATION  1998    Family Psychiatric History: Unknown  Family History:  Family History  Problem Relation Age of Onset   Asthma Father     Social History:  Social History   Socioeconomic History   Marital status: Married    Spouse name: Not on file   Number of children: 2   Years of education: Not on file   Highest education level: Not on file  Occupational History   Occupation: Corporate treasurer  Tobacco Use   Smoking status: Never   Smokeless tobacco: Never  Vaping Use   Vaping Use: Never used  Substance and Sexual Activity   Alcohol use: No   Drug use: No   Sexual activity: Yes    Comment: with husband   Other Topics Concern   Not on file  Social History Narrative   Married high school boyfriend at age 21, two boys, still married but he has been living with other women for years.   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  Nephro recommended avoiding use.   Haldol [Haloperidol Lactate] Other (See Comments)    Tardive dyskinesia   Nsaids Other (See Comments)    Nephro recommended avoiding after acute renal failure requiring hemodialysis associated with a suicide attempt.    Latex Other (See Comments)    Rash around IV site    Metabolic Disorder Labs: Lab Results  Component Value Date   HGBA1C 10.4 (A) 06/21/2020   MPG 243.17 11/14/2017   MPG 186 05/09/2015   No results found for: PROLACTIN Lab Results  Component Value Date   CHOL 195 07/13/2019   TRIG 251 (H) 07/13/2019   HDL 52 07/13/2019   CHOLHDL 3.8 07/13/2019   VLDL 46 (H) 04/10/2015   LDLCALC 101 (H) 07/13/2019   LDLCALC 55 04/10/2015   Lab Results  Component Value Date   TSH 3.247 11/13/2017   TSH 2.016 03/14/2017    Therapeutic Level Labs: No results found for: LITHIUM No results found for: VALPROATE No components found for:  CBMZ  Current Medications: Current Outpatient Medications  Medication Sig Dispense Refill   albuterol (VENTOLIN HFA) 108 (90 Base) MCG/ACT inhaler Inhale 2 puffs into  the lungs every 4 (four) hours as needed for wheezing or shortness of breath. 18 g 0   budesonide-formoterol (SYMBICORT) 80-4.5 MCG/ACT inhaler Inhale 2 puffs into the lungs in the morning and at bedtime. 1 each 12   Cariprazine HCl 6 MG CAPS Take 1 capsule (6 mg total) by mouth daily. (Patient not taking: Reported on 08/09/2020) 30 capsule 2   ferrous sulfate 325 (65 FE) MG tablet Take 1 tablet (325 mg total) by mouth 3 (three) times daily with meals. (Patient taking differently: Take 325 mg by mouth daily. Take every other day with stool softer) 90 tablet 3   FLUoxetine (PROZAC) 40 MG capsule Take 2 capsules (80 mg total) by mouth daily. 60 capsule 2   fluticasone (FLONASE) 50 MCG/ACT nasal spray Place 1 spray into both nostrils daily. 1 spray in each nostril every day 16 g 1   hydrOXYzine (VISTARIL) 50 MG capsule Take 1 capsule (50 mg total) by mouth 3 (three) times daily as needed for itching. 90 capsule 2   insulin aspart (NOVOLOG) 100 UNIT/ML FlexPen Inject 10 units with Lunch and 15 units with Dinner 15 mL 11   Insulin Glargine (BASAGLAR KWIKPEN) 100 UNIT/ML Inject 50 Units into the skin 2  (two) times daily. Take 50U twice a day 15 mL 6   ipratropium-albuterol (DUONEB) 0.5-2.5 (3) MG/3ML SOLN Take 3 mLs by nebulization 3 (three) times daily as needed. (Patient not taking: Reported on 08/09/2020) 360 mL 3   levothyroxine (SYNTHROID) 300 MCG tablet Take 1 tablet (300 mcg total) by mouth daily. 90 tablet 3   lisinopril (ZESTRIL) 5 MG tablet Take 1 tablet by mouth once daily 30 tablet 0   LORazepam (ATIVAN) 1 MG tablet TAKE 1 TABLET BY MOUTH EVERY 8 HOURS AS NEEDED FOR  ANXIETY. 45 tablet 0   Melatonin 3 MG CAPS Take 1 capsule (3 mg total) by mouth at bedtime. (Patient not taking: Reported on 08/09/2020) 60 capsule 2   metFORMIN (GLUCOPHAGE-XR) 500 MG 24 hr tablet Take 2 tablets (1,000 mg total) by mouth 2 (two) times daily with a meal. 120 tablet 11   metoprolol succinate (TOPROL-XL) 25 MG 24 hr tablet Take 1 tablet by mouth once daily 30 tablet 0   mirtazapine (REMERON) 30 MG tablet Take 1 tablet (30 mg total) by mouth daily. 30 tablet 2   pantoprazole (PROTONIX) 40 MG tablet Take 1 tablet (40 mg total) by mouth daily. 30 tablet 3   Semaglutide, 2 MG/DOSE, (OZEMPIC, 2 MG/DOSE,) 8 MG/3ML SOPN Inject 2 mg into the skin once a week. 3 mL 11   Vitamin D, Ergocalciferol, (DRISDOL) 50000 units CAPS capsule Take 1 capsule (50,000 Units total) by mouth every 7 (seven) days. Sunday 12 capsule 0   No current facility-administered medications for this visit.     Musculoskeletal: Strength & Muscle Tone:  Unable to assess patient unable to log in virtually. Visit on telephone Gait & Station:  Unable to assess patient unable to log in virtually. Visit on telephone Patient leans: N/A  Psychiatric Specialty Exam: Review of Systems  There were no vitals taken for this visit.There is no height or weight on file to calculate BMI.  General Appearance:  Unable to assess patient unable to log in virtually. Visit on telephone  Eye Contact:   Unable to assess patient unable to log in virtually. Visit on  telephone  Speech:  Clear and Coherent and Normal Rate  Volume:  Normal  Mood:  Euthymic  Affect:  Appropriate and Congruent  Thought Process:  Coherent, Goal Directed and Linear  Orientation:  Full (Time, Place, and Person)  Thought Content: WDL and Logical   Suicidal Thoughts:  No  Homicidal Thoughts:  No  Memory:  Immediate;   Good Recent;   Good Remote;   Good  Judgement:  Good  Insight:  Good  Psychomotor Activity:  Normal  Concentration:  Concentration: Good and Attention Span: Good  Recall:  Good  Fund of Knowledge: Good  Language: Good  Akathisia:  No  Handed:  Right  AIMS (if indicated): Not done  Assets:  Communication Skills Desire for Improvement Financial Resources/Insurance Housing Social Support  ADL's:  Intact  Cognition: WNL  Sleep:  Good   Screenings: GAD-7    Flowsheet Row Video Visit from 08/30/2020 in [facility] Video Visit from 06/07/2020 in [facility]  Total GAD-7 Score 2 21      PHQ2-9    Flowsheet Row Video Visit from 08/30/2020 in [facility] Office Visit from 07/25/2020 in [facility] Office Visit from 06/21/2020 in [facility] Video Visit from 06/07/2020 in [facility] Office Visit from 03/01/2020 in [facility]  PHQ-2 Total Score '1 6 2 3 1  '$ PHQ-9 Total Score -- '22 8 11 5      '$ Flowsheet Row Video Visit from 06/07/2020 in [facility] Error: Q7 should not be populated when Q6 is No        Assessment and Plan: Patient notes that her anxiety and depression has improved since her last visit.  Informed patient that mirtazapine and Prozac in conjunction can lead to serotonin syndrome discussed symptoms.  She informed Probation officer that she likes her medications in combination and will notify writer if any side effects occur.   At this time no medications changed today.  Patient agreeable to continue medication as prescribed.  1. Generalized anxiety disorder  Continue- FLUoxetine (PROZAC) 40 MG capsule; Take 2 capsules (80 mg total) by mouth daily.  Dispense: 60 capsule; Refill: 2 Continue- hydrOXYzine (VISTARIL) 50 MG capsule; Take 1 capsule (50 mg total) by mouth 3 (three) times daily as needed for itching.  Dispense: 90 capsule; Refill: 2  2. Mild depression (HCC)  Continue- FLUoxetine (PROZAC) 40 MG capsule; Take 2 capsules (80 mg total) by mouth daily.  Dispense: 60 capsule; Refill: 2 Continue- mirtazapine (REMERON) 30 MG tablet; Take 1 tablet (30 mg total) by mouth daily.  Dispense: 30 tablet; Refill: 2  Follow-up in 3 months  Salley Slaughter, NP 08/30/2020, 4:03 PM

## 2020-08-30 NOTE — Telephone Encounter (Signed)
-----   Message from Leavy Cella, [place] sent at 08/16/2020  2:42 PM EDT ----- Regarding: DM follow up

## 2020-08-30 NOTE — Telephone Encounter (Signed)
Follow-up contact attempted to assess blood sugar control.   No answer, left message requesting return call - provided direct line phone number.   I will try again in 1 week if I do not hear from her.

## 2020-09-07 ENCOUNTER — Emergency Department (HOSPITAL_COMMUNITY)
Admission: EM | Admit: 2020-09-07 | Discharge: 2020-09-07 | Disposition: A | Discharge status: Home / Self Care | Payer: BC Managed Care – PPO | Attending: Emergency Medicine | Admitting: Emergency Medicine

## 2020-09-07 ENCOUNTER — Encounter (HOSPITAL_COMMUNITY): Payer: Self-pay | Admitting: Emergency Medicine

## 2020-09-07 ENCOUNTER — Telehealth: Payer: Self-pay | Admitting: Pharmacist

## 2020-09-07 ENCOUNTER — Other Ambulatory Visit: Payer: Self-pay

## 2020-09-07 ENCOUNTER — Emergency Department (HOSPITAL_COMMUNITY): Payer: BC Managed Care – PPO

## 2020-09-07 DIAGNOSIS — I129 Hypertensive chronic kidney disease with stage 1 through stage 4 chronic kidney disease, or unspecified chronic kidney disease: Secondary | ICD-10-CM | POA: Insufficient documentation

## 2020-09-07 DIAGNOSIS — Z7984 Long term (current) use of oral hypoglycemic drugs: Secondary | ICD-10-CM | POA: Diagnosis not present

## 2020-09-07 DIAGNOSIS — E1122 Type 2 diabetes mellitus with diabetic chronic kidney disease: Secondary | ICD-10-CM | POA: Insufficient documentation

## 2020-09-07 DIAGNOSIS — Z7952 Long term (current) use of systemic steroids: Secondary | ICD-10-CM | POA: Insufficient documentation

## 2020-09-07 DIAGNOSIS — E039 Hypothyroidism, unspecified: Secondary | ICD-10-CM | POA: Insufficient documentation

## 2020-09-07 DIAGNOSIS — M79671 Pain in right foot: Secondary | ICD-10-CM | POA: Diagnosis not present

## 2020-09-07 DIAGNOSIS — N183 Chronic kidney disease, stage 3 unspecified: Secondary | ICD-10-CM | POA: Insufficient documentation

## 2020-09-07 DIAGNOSIS — Z794 Long term (current) use of insulin: Secondary | ICD-10-CM | POA: Diagnosis not present

## 2020-09-07 DIAGNOSIS — Z91040 Latex allergy status: Secondary | ICD-10-CM | POA: Insufficient documentation

## 2020-09-07 DIAGNOSIS — M25571 Pain in right ankle and joints of right foot: Secondary | ICD-10-CM | POA: Diagnosis not present

## 2020-09-07 DIAGNOSIS — J45909 Unspecified asthma, uncomplicated: Secondary | ICD-10-CM | POA: Diagnosis not present

## 2020-09-07 DIAGNOSIS — Z79899 Other long term (current) drug therapy: Secondary | ICD-10-CM | POA: Diagnosis not present

## 2020-09-07 MED ORDER — TRAMADOL HCL 50 MG PO TABS
50.0000 mg | ORAL_TABLET | Freq: Once | ORAL | Status: AC
  Administered 2020-09-07: 50 mg via ORAL
  Filled 2020-09-07: qty 1

## 2020-09-07 MED ORDER — OXYCODONE-ACETAMINOPHEN 5-325 MG PO TABS: 1.0000 | ORAL_TABLET | Freq: Three times a day (TID) | ORAL | 0 refills | Status: AC | PRN

## 2020-09-07 NOTE — Telephone Encounter (Signed)
Contacted patient RE diabetes control following visit where prandial insulin (Novolog) was started.   Patient states she is currently having her foot evaluated in the emergency room.   She was willing to share that her blood sugars are improved into the 200- higher 300 range.  She denied any readings in the 100-200 range.   I asked her to schedule a follow up with PCP or with me in the [facility] in the future for additional dosage adjustments of her insulin.  She agreed that she would make a follow-up visit soon.

## 2020-09-07 NOTE — ED Provider Notes (Signed)
[facility] EMERGENCY DEPARTMENT Provider Note   CSN: [id number] Arrival date & time: 09/07/20  0955     History Chief Complaint  Patient presents with   Foot Pain    [name] is a 50 y.o. female with a history of anxiety, depression, diabetes, hypertension, arthritis of right ankle.  Patient presents to the emergency department with a chief complaint of pain and swelling to right foot/ankle.  Patient reports that she had an injury 7 years prior and has had chronic right ankle pain and intermittent swelling since then.  Patient reports that on Sunday she had a change in her symptoms.  Pain and swelling became worse.  Patient rates pain 10/10 on the pain scale.  Pain is worse with touch, movement, or weightbearing.  No alleviating factors.  Pain has gotten progressively worse since Sunday.  Patient describes pain as a throbbing.  Patient also reports decreased sensation to her toes.  Patient denies any recent falls or injuries.  Patient has been more active standing and walking on her feet recently.  Patient denies any erythema, rash, wounds, fever, chills, nausea, vomiting, abdominal pain.  Patient states that she has follow-up with physical therapy this coming week for ankle strengthening related to her arthritis.  Patient has previously seen Dr. Lucia Gaskins for her arthritis.   Foot Pain Pertinent negatives include no abdominal pain.      Past Medical History:  Diagnosis Date   Acute renal failure ([place]) 05/27/10   hemodialysis for 6 weeks   Anxiety    Asthma    Depression    Diabetes mellitus    Hypertension    Rhabdomyolysis 06/06/10   after drug overdose   Suicide attempt by drug ingestion ([place]) 06/05/10   result rhabdomyolosis and ARF requrining dialysis     Patient Active Problem List   Diagnosis Date Noted   Mild depression ([place]) 08/30/2020   Arthritis of right ankle 07/25/2020   Right knee pain 06/21/2020   Sinus pain 03/01/2020   Bleeding nose  03/01/2020   Depression 11/21/2019   Moderate episode of recurrent major depressive disorder ([place]) 09/22/2019   Generalized anxiety disorder 09/22/2019   Bipolar I disorder, most recent episode depressed ([place]) 09/22/2019   Bilateral knee pain 03/14/2019   Osteoarthritis 11/24/2018   Bipolar disorder ([place]) 04/29/2017   HLD (hyperlipidemia) 03/19/2017   Asthma exacerbation 03/14/2017   Weight gain 12/06/2016   MDD (major depressive disorder), recurrent severe, 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/11/2015   Dyspnea 05/09/2015   Essential hypertension    Vitamin D deficiency 04/11/2015   Fatigue 04/10/2015   Non compliance w medication regimen 04/10/2015   Thyroid activity decreased    Adjustment disorder with mixed anxiety and depressed mood    Cough 09/20/2014   Grief at loss of child 06/22/2014   Dysmenorrhea 11/25/2013   Suicidal ideation 05/27/2011   CKD (chronic kidney disease) stage 3, GFR 30-59 ml/min ([place]) 11/06/2010   ENDOMETRIAL HYPERPLASIA UNSPECIFIED 02/07/2008   MENORRHAGIA 01/03/2008   Allergic rhinitis 06/24/2007   Essential hypertension, benign 02/03/2007   Hypothyroidism 05/07/2006   Type II diabetes mellitus with complication ([place]) [id number]   Morbid obesity ([place]) 05/07/2006   Iron deficiency anemia 05/07/2006   Anxiety state 05/07/2006   OBSESSIVE COMPUL. DISORDER 05/07/2006    Past Surgical History:  Procedure Laterality Date   ANKLE ARTHROSCOPY Right 08/17/2015   Procedure: RIGHT ANKLE ARTHROSCOPY WITH SYNOVECTOMY AND LOOSE BODY EXCISION;  Surgeon: Melrose Nakayama, MD;  Location: [facility];  Service: Orthopedics;  Laterality: Right;   CHOLECYSTECTOMY     TUBAL LIGATION  1998     OB History   No obstetric history on file.     Family History  Problem Relation Age of Onset   Asthma Father     Social History   Tobacco Use   Smoking status: Never   Smokeless tobacco: Never  Vaping Use   Vaping Use: Never used  Substance Use Topics   Alcohol use: No   Drug  use: No    Home Medications Prior to Admission medications   Medication Sig Start Date End Date Taking? Authorizing Provider  albuterol (VENTOLIN HFA) 108 (90 Base) MCG/ACT inhaler Inhale 2 puffs into the lungs every 4 (four) hours as needed for wheezing or shortness of breath. 06/21/20   [name], DO  budesonide-formoterol (SYMBICORT) 80-4.5 MCG/ACT inhaler Inhale 2 puffs into the lungs in the morning and at bedtime. 08/09/20   Zenia Resides, MD  Cariprazine HCl 6 MG CAPS Take 1 capsule (6 mg total) by mouth daily. Patient not taking: Reported on 08/09/2020 06/07/20   Salley Slaughter, NP  ferrous sulfate 325 (65 FE) MG tablet Take 1 tablet (325 mg total) by mouth 3 (three) times daily with meals. Patient taking differently: Take 325 mg by mouth daily. Take every other day with stool softer 12/04/16   Verner Mould, MD  FLUoxetine (PROZAC) 40 MG capsule Take 2 capsules (80 mg total) by mouth daily. 08/30/20   Salley Slaughter, NP  fluticasone (FLONASE) 50 MCG/ACT nasal spray Place 1 spray into both nostrils daily. 1 spray in each nostril every day 03/01/20   Daisy Floro, DO  hydrOXYzine (VISTARIL) 50 MG capsule Take 1 capsule (50 mg total) by mouth 3 (three) times daily as needed for itching. 08/30/20   Salley Slaughter, NP  insulin aspart (NOVOLOG) 100 UNIT/ML FlexPen Inject 10 units with Lunch and 15 units with Dinner 08/09/20   Zenia Resides, MD  Insulin Glargine (BASAGLAR KWIKPEN) 100 UNIT/ML Inject 50 Units into the skin 2 (two) times daily. Take 50U twice a day 06/21/20   [name], DO  ipratropium-albuterol (DUONEB) 0.5-2.5 (3) MG/3ML SOLN Take 3 mLs by nebulization 3 (three) times daily as needed. Patient not taking: Reported on 08/09/2020 11/14/17   Ledell Noss, MD  levothyroxine (SYNTHROID) 300 MCG tablet Take 1 tablet (300 mcg total) by mouth daily. 11/22/18   Nuala Alpha, DO  lisinopril (ZESTRIL) 5 MG tablet Take 1 tablet by mouth once  daily 02/22/20   Espinoza, Alejandra, DO  LORazepam (ATIVAN) 1 MG tablet TAKE 1 TABLET BY MOUTH EVERY 8 HOURS AS NEEDED FOR  ANXIETY. 08/21/20   [name], DO  Melatonin 3 MG CAPS Take 1 capsule (3 mg total) by mouth at bedtime. Patient not taking: Reported on 08/09/2020 06/07/20   Salley Slaughter, NP  metFORMIN (GLUCOPHAGE-XR) 500 MG 24 hr tablet Take 2 tablets (1,000 mg total) by mouth 2 (two) times daily with a meal. 08/09/20   Hensel, Jamal Collin, MD  metoprolol succinate (TOPROL-XL) 25 MG 24 hr tablet Take 1 tablet by mouth once daily 02/22/20   [name], DO  mirtazapine (REMERON) 30 MG tablet Take 1 tablet (30 mg total) by mouth daily. 08/30/20   Salley Slaughter, NP  pantoprazole (PROTONIX) 40 MG tablet Take 1 tablet (40 mg total) by mouth daily. 11/22/18   Lockamy, Christia Reading, DO  Semaglutide, 2 MG/DOSE, (OZEMPIC, 2 MG/DOSE,) 8 MG/3ML SOPN  Inject 2 mg into the skin once a week. 08/09/20   Zenia Resides, MD  Vitamin D, Ergocalciferol, (DRISDOL) 50000 units CAPS capsule Take 1 capsule (50,000 Units total) by mouth every 7 (seven) days. Sunday 12/04/16   Verner Mould, MD    Allergies    Ace inhibitors, Haldol [haloperidol lactate], Nsaids, and Latex  Review of Systems   Review of Systems  Constitutional:  Negative for chills and fever.  Cardiovascular:  Negative for leg swelling.  Gastrointestinal:  Negative for abdominal pain, nausea and vomiting.  Musculoskeletal:  Positive for arthralgias, gait problem, joint swelling and myalgias. Negative for back pain and neck pain.  Skin:  Negative for color change, pallor, rash and wound.  Neurological:  Positive for numbness. Negative for weakness.   Physical Exam Updated Vital Signs BP (!) 145/90   Pulse 94   Temp 98.8 F (37.1 C) (Oral)   Resp 18   Ht '5\' 11"'$  (1.803 m)   Wt (!) 176.9 kg   LMP 08/31/2020   SpO2 99%   BMI 54.39 kg/m   Physical Exam Vitals and nursing note reviewed.  Constitutional:       General: She is not in acute distress.    Appearance: She is not ill-appearing, toxic-appearing or diaphoretic.  HENT:     Head: Normocephalic.  Eyes:     General: No scleral icterus.       Right eye: No discharge.        Left eye: No discharge.  Cardiovascular:     Rate and Rhythm: Normal rate.     Pulses:          Dorsalis pedis pulses are 2+ on the right side.  Pulmonary:     Effort: Pulmonary effort is normal.  Musculoskeletal:     Right lower leg: No swelling, deformity, lacerations, tenderness or bony tenderness. No edema.     Right ankle: Swelling present. No deformity, ecchymosis or lacerations. Tenderness present. Normal range of motion.     Right Achilles Tendon: No tenderness or defects. Thompson's test negative.     Right foot: Normal range of motion and normal capillary refill. Tenderness and bony tenderness present. No swelling, deformity, laceration or crepitus.     Comments: Diffuse tenderness throughout ankle.  Swelling to medial and lateral aspect.    Feet:     Right foot:     Skin integrity: Dry skin present. No ulcer, blister, skin breakdown, erythema, warmth, callus or fissure.     Toenail Condition: Right toenails are normal.     Comments: Patient has diffuse tenderness to dorsum of right foot, no erythema, warmth, wounds noted.  Patient reports decree sensation to light touch of second right toe.  Cap refill less than 2 seconds in all digits of right foot.  Patient has full range of motion to all digits of right foot. Skin:    General: Skin is warm and dry.  Neurological:     General: No focal deficit present.     Mental Status: She is alert and oriented to person, place, and time.     GCS: GCS eye subscore is 4. GCS verbal subscore is 5. GCS motor subscore is 6.  Psychiatric:        Behavior: Behavior is cooperative.    ED Results / Procedures / Treatments   Labs (all labs ordered are listed, but only abnormal results are displayed) Labs Reviewed - No  data to display  EKG None  Radiology DG  Ankle Complete Right  Result Date: 09/07/2020 CLINICAL DATA:  Right foot and ankle pain since Sunday.  No injury. EXAM: RIGHT FOOT COMPLETE - 3+ VIEW; RIGHT ANKLE - COMPLETE 3+ VIEW COMPARISON:  Right ankle x-rays dated November 18, 2019. Right foot x-rays dated March 29, 2019. FINDINGS: No acute fracture or dislocation. The ankle mortise is symmetric. The talar dome is intact. Unchanged prominent dorsal talonavicular and anterior tibiotalar osteophytes. Joint spaces are relatively preserved. Bone mineralization is normal. Soft tissues are unremarkable. IMPRESSION: 1. No acute osseous abnormality of the right foot and ankle. 2. Unchanged tibiotalar and talonavicular osteoarthritis. Electronically Signed   By: Titus Dubin M.D.   On: 09/07/2020 11:04   DG Foot Complete Right  Result Date: 09/07/2020 CLINICAL DATA:  Right foot and ankle pain since Sunday.  No injury. EXAM: RIGHT FOOT COMPLETE - 3+ VIEW; RIGHT ANKLE - COMPLETE 3+ VIEW COMPARISON:  Right ankle x-rays dated November 18, 2019. Right foot x-rays dated March 29, 2019. FINDINGS: No acute fracture or dislocation. The ankle mortise is symmetric. The talar dome is intact. Unchanged prominent dorsal talonavicular and anterior tibiotalar osteophytes. Joint spaces are relatively preserved. Bone mineralization is normal. Soft tissues are unremarkable. IMPRESSION: 1. No acute osseous abnormality of the right foot and ankle. 2. Unchanged tibiotalar and talonavicular osteoarthritis. Electronically Signed   By: Titus Dubin M.D.   On: 09/07/2020 11:04    Procedures Procedures   Medications Ordered in ED Medications - No data to display  ED Course  I have reviewed the triage vital signs and the nursing notes.  Pertinent labs & imaging results that were available during my care of the patient were reviewed by me and considered in my medical decision making (see chart for details).    MDM  Rules/Calculators/A&P                          Alert 50 year old female no acute distress, nontoxic-appearing.  Patient presents emergency department with a chief complaint of pain and swelling to right foot/ankle.  Patient has had chronic ankle pain and swelling for the last 7 years however has had an acute change since this past Sunday.  Patient denies any new falls or injuries.  Patient has been more active standing and ambulating on her feet.  No swelling, warmth, or erythema to patient's right foot or ankle.  Low suspicion for septic arthritis or gout at this time. Patient has +2 right dorsalis pedis pulse, cap refill less than 2 seconds in all digits of right foot.  Low suspicion for vascular insufficiency causing patient's pain.  X-ray imaging of right foot and ankle were obtained while patient was in triage.  X-ray imaging shows no acute osseous change.  Unchanged tibiotalar and talonavicular osteoarthritis.  Patient is able to stand and ambulate.  Will place patient in a postop shoe and give her short course of Percocet for her pain.  Patient advised to continue using her ankle brace.  Advised to elevate leg as much as possible to help reduce swelling as well as apply ice for 20 minutes at a time.  Or patient follow-up with her orthopedic provider if her pain is unimproved due to acute change.  Patient given strict return precautions.  Patient expressed understanding of all instructions and is agreeable with this plan.  Final Clinical Impression(s) / ED Diagnoses Final diagnoses:  Right ankle pain, unspecified chronicity  Right foot pain    Rx / DC Orders  ED Discharge Orders          Ordered    oxyCODONE-acetaminophen (PERCOCET/ROXICET) 5-325 MG tablet  Every 8 hours PRN        09/07/20 1401             Loni Beckwith, PA-C 09/07/20 2114    Lorelle Gibbs, DO 09/10/20 1747

## 2020-09-07 NOTE — Discharge Instructions (Addendum)
You came to the emergency department today to be evaluated for your ankle and foot pain.  Your physical exam was reassuring.  The x-ray imaging obtained showed no broken bones or dislocations.  It also showed that your arthritis is stable.  Due to your worsening pain I will give you a short course of Percocet medication.  Please take 1 pill as needed every 6 hours for severe pain.  Today you received medications that may make you sleepy or impair your ability to make decisions.  For the next 24 hours please do not drive, operate heavy machinery, care for a small child with out another adult present, or perform any activities that may cause harm to you or someone else if you were to fall asleep or be impaired.   You are being prescribed a medication which may make you sleepy. Please follow up of listed precautions for at least 24 hours after taking one dose.   If your pain does not improve please follow-up with your orthopedic physician Dr. Lucia Gaskins.  Get help right away if: Your foot, leg, toes, or ankle: Tingles or becomes numb. Becomes swollen. Turns pale or blue.

## 2020-09-07 NOTE — ED Triage Notes (Signed)
Pt reports rt foot pain and swelling that has gotten worse since Sunday. Pt reports difficulty ambulating with throbbing and numbness. PMS intact. Pain increased with bearing weight. No new injuries. Hx of foot surgery and arthritis. Pt attempted otc medication with no relief.

## 2020-09-07 NOTE — Telephone Encounter (Signed)
-----   Message from Leavy Cella, [place] sent at 08/30/2020 10:20 AM EDT ----- Regarding: Diabetes Follow-up

## 2020-09-07 NOTE — ED Provider Notes (Signed)
Emergency Medicine Provider Triage Evaluation Note  [name] , a 50 y.o. female  was evaluated in triage.  Pt complains of pain and swelling to right foot/ankle.  Patient reports that she had a injury to her right foot and ankle 7 years prior and since then has had chronic pain.  Patient reports that she has had increasing pain starting this Sunday.  Has also developed swelling to her ankle and decreased sensation to her toes on exam today.  Patient denies any falls or injuries.  Pain has gotten progressively worse.  Patient rates pain 10/10 on the pain scale.  Described as throbbing.  Patient denies any erythema, rash, wounds, fevers, or chills.  Review of Systems  Positive: Arthralgias, myalgias,  Negative: erythema, rash, wounds, fevers, or chills  Physical Exam  BP (!) 145/90   Pulse 94   Temp 98.8 F (37.1 C) (Oral)   Resp 18   Ht '5\' 11"'$  (1.803 m)   Wt (!) 176.9 kg   LMP 08/31/2020   SpO2 99%   BMI 54.39 kg/m  Gen:   Awake, no distress   Resp:  Normal effort  MSK:   Moves extremities without difficulty, swelling to medial and lateral aspects of right ankle.  Tenderness throughout right foot and ankle.  Patient has full range of motion to right ankle in all digits of right foot.  Reports decreased sensation to second toe.  +2 right dorsalis pedis pulse.  No erythema, warmth, or rash noted to right foot or ankle.  Patient has no swelling or tenderness to right calf. Other:    Medical Decision Making  Medically screening exam initiated at 10:18 AM.  Appropriate orders placed.  [name] was informed that the remainder of the evaluation will be completed by another provider, this initial triage assessment does not replace that evaluation, and the importance of remaining in the ED until their evaluation is complete.  The patient appears stable so that the remainder of the work up may be completed by another provider.      Loni Beckwith, PA-C 09/07/20 1021     Lorelle Gibbs, DO 09/10/20 1746

## 2020-09-11 ENCOUNTER — Ambulatory Visit: Payer: BC Managed Care – PPO

## 2020-09-13 ENCOUNTER — Ambulatory Visit: Payer: BC Managed Care – PPO | Attending: Sports Medicine

## 2020-09-13 ENCOUNTER — Other Ambulatory Visit: Payer: Self-pay

## 2020-09-13 DIAGNOSIS — R2681 Unsteadiness on feet: Secondary | ICD-10-CM | POA: Diagnosis present

## 2020-09-13 DIAGNOSIS — M25561 Pain in right knee: Secondary | ICD-10-CM | POA: Diagnosis present

## 2020-09-13 DIAGNOSIS — G8929 Other chronic pain: Secondary | ICD-10-CM

## 2020-09-13 DIAGNOSIS — M25571 Pain in right ankle and joints of right foot: Secondary | ICD-10-CM | POA: Diagnosis present

## 2020-09-13 DIAGNOSIS — M6281 Muscle weakness (generalized): Secondary | ICD-10-CM

## 2020-09-13 NOTE — Patient Instructions (Signed)
  [id number]

## 2020-09-13 NOTE — Therapy (Addendum)
[place], [place], [address] Phone: [phone]   Fax:  [phone]  Physical Therapy Evaluation/ Discharge Summary  Patient Details  Name: [name] MRN: [id number] Date of Birth: 01-Jun-1970 Referring Provider (PT): Thurman Coyer   Encounter Date: 09/13/2020   PT End of Session - 09/13/20 1909     Visit Number 1    Number of Visits 17    Date for PT Re-Evaluation 11/08/20    Authorization Type [employer] VL: 30 visits    PT Start Time 1700    PT Stop Time 4193    PT Time Calculation (min) 45 min    Activity Tolerance Patient tolerated treatment well    Behavior During Therapy [facility] for tasks assessed/performed             Past Medical History:  Diagnosis Date   Acute renal failure ([place]) 05/27/10   hemodialysis for 6 weeks   Anxiety    Asthma    Depression    Diabetes mellitus    Hypertension    Rhabdomyolysis 06/06/10   after drug overdose   Suicide attempt by drug ingestion ([place]) 06/05/10   result rhabdomyolosis and ARF requrining dialysis     Past Surgical History:  Procedure Laterality Date   ANKLE ARTHROSCOPY Right 08/17/2015   Procedure: RIGHT ANKLE ARTHROSCOPY WITH SYNOVECTOMY AND LOOSE BODY EXCISION;  Surgeon: Melrose Nakayama, MD;  Location: [place];  Service: Orthopedics;  Laterality: Right;   CHOLECYSTECTOMY     TUBAL LIGATION  1998    There were no vitals filed for this visit.    Subjective Assessment - 09/13/20 1706     Subjective Pt reports primary c/o R knee and ankle pain following a fall in 2015 at work resulting in a small ankle fracture and resulting surgery in 2017. 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 She reports typically wearing an ankle brace for pain but deferred today since she was coming to PT. She reports an average level of pain of 5/10 and a max level of 10/10. She experienced insidious  exacerbation of her pain last Friday, leading her to be seen in the ED where imaging was performed. Her recent ankle/ foot imaging details no fracture or anomoly other than her established ankle OA. She also reports R diabetic neuropathy in her foot. She reports that her pain wakes her at night; this is not improved by positional change. She states her pain is more severe during the day when weight-bearing. She denies N/T in the groin, nausea/ vomiting, unexplained weight gain/ loss, or bowel/ bladder changes.    Limitations Standing;Walking;House hold activities;Sitting    How long can you sit comfortably? 30 minutes    How long can you stand comfortably? 2 minutes    How long can you walk comfortably? 2 minutes    Diagnostic tests 09/07/2020 DG R foot/ ankle: IMPRESSION:  1. No acute osseous abnormality of the right foot and ankle.  2. Unchanged tibiotalar and talonavicular osteoarthritis.    Patient Stated Goals Return to playing with her grandkids, walking without pain    Currently in Pain? Yes    Pain Score 5     Pain Location Ankle    Pain Orientation Right    Pain Descriptors / Indicators Constant;Nagging    Pain Type Chronic pain    Pain Onset More than a month ago  Pain Frequency Constant    Aggravating Factors  Walking, standing, prolonged sitting    Pain Relieving Factors ice, ankle brace, ibuprofen    Effect of Pain on Daily Activities Unable to perform household chores, walk up stairs at home without pain.    Multiple Pain Sites Yes    Pain Score 3    Pain Location Knee    Pain Orientation Right    Pain Descriptors / Indicators Sharp   occasional buckling 3-4x/week   Pain Type Chronic pain    Pain Onset More than a month ago    Pain Frequency Intermittent                [facility] PT Assessment - 09/13/20 0001       Assessment   Medical Diagnosis Osteoarthritis of left knee, unspecified osteoarthritis type (M17.12), Right ankle pain, unspecified chronicity (M25.571)     Referring Provider (PT) Lilia Argue R    Onset Date/Surgical Date 09/13/13    Hand Dominance Right    Prior Therapy Yes, 5 years ago      Precautions   Precautions None      Restrictions   Weight Bearing Restrictions No      Balance Screen   Has the patient fallen in the past 6 months No    Has the patient had a decrease in activity level because of a fear of falling?  No    Is the patient reluctant to leave their home because of a fear of falling?  No      Home Environment   Living Environment Private residence    Living Arrangements Spouse/significant other    Available Help at Discharge Family    Type of [place] Access Level entry    Home Layout Multi-level    Alternate Level Stairs-Number of Steps 14    Alternate Level Stairs-Rails Left;Can reach [employer] - single point      Prior Function   Level of Independence Independent      Observation/Other Assessments   Focus on Therapeutic Outcomes (FOTO)  Knee: 39% (projected 54%); Ankle: 7% (projected 38%)      Functional Tests   Functional tests Single leg stance;Other      Single Leg Stance   Comments Unable to complete      Other:   Other/ Comments Unable to complete      Strength   Right Hip Flexion 4-/5    Right Hip Extension 4-/5    Right Hip ABduction 4-/5    Left Hip Flexion 4-/5    Left Hip Extension 4-/5    Left Hip ABduction 4-/5    Right Knee Flexion 5/5    Right Knee Extension 4+/5   with pain   Left Knee Flexion 5/5    Left Knee Extension 5/5    Right Ankle Dorsiflexion 3/5   with pain   Right Ankle Plantar Flexion 4+/5    Right Ankle Inversion 3/5   with pain   Right Ankle Eversion 3/5   with pain   Left Ankle Dorsiflexion 5/5    Left Ankle Plantar Flexion 5/5    Left Ankle Inversion 5/5    Left Ankle Eversion 5/5      Palpation   Palpation comment TTP to R navicular, pt describes as surgical pain that has persisted since her surgery in 2017       Transfers   Five time sit to stand  comments  30sec with use of chair arms                        Objective measurements completed on examination: See above findings.               PT Education - 09/13/20 1908     Education Details Pt educated on POC, HEP form, and possible underlying etiology to her pain.    Person(s) Educated Patient    Methods Explanation;Demonstration;Handout    Comprehension Verbalized understanding;Returned demonstration              PT Short Term Goals - 09/13/20 2159       PT SHORT TERM GOAL #1   Title Pt will demonstrate understanding and report adherence to an HEP in order to promote independence in management of her primary impairments.    Baseline HEP given at eval.    Time 4    Period Weeks    Status New    Target Date 10/11/20      PT SHORT TERM GOAL #2   Title Pt will report ability to stand for 10 minutes without the need of a seated rest due to leg pain.    Baseline Pt unable to stand >2 minutes without need of a rest break due to pain.    Time 4    Period Weeks    Status New    Target Date 10/11/20               PT Long Term Goals - 09/13/20 2201       PT LONG TERM GOAL #1   Title Pt will achieve an ankle FOTO score of 38% in order to demonstrate improved functional ability as it relates to her ankle pain.    Baseline 7% functional ability    Time 8    Period Weeks    Status New    Target Date 11/08/20      PT LONG TERM GOAL #2   Title Pt will achieve a knee FOTO score of 54% in order to demonstrate improved functional ability as it relates to her knee pain.    Baseline 39% functional ability    Time 8    Period Weeks    Status New    Target Date 11/08/20      PT LONG TERM GOAL #3   Title Pt will achieve BIL global hip MMT of 5/5 in order to promote independent progression of LE strengthening regimen.    Baseline BIL global hip MMT = 4-/5    Time 8    Period Weeks    Status New    Target  Date 11/08/20      PT LONG TERM GOAL #4   Title Pt will demonstrate R ankle MMT of 4+/5 or greater in all planes of motion with 3/10 pain or lower in order to be able to play with her grandchildren without limitation.    Baseline R DF, inversion, and eversion = 3/5 with pain.    Time 8    Period Weeks    Status New    Target Date 11/08/20      PT LONG TERM GOAL #5   Title Pt will report ability to stand and walk >30 minutes with 3/10 pain or lower in order to walk and perform standing exercise for fitness without limitation due to pain.    Baseline Pt unable to walk or stand >2 minutes  without the need of a seated rest.    Time 8    Period Weeks    Status New    Target Date 11/08/20                    Plan - 09/13/20 1910     Clinical Impression Statement Pt is a pleasant 50yo F who presents to clinic with primary c/o chronic R ankle and knee pain following a fall at work in 2015. Upon assessment, her primary impairments include decreased and/or painful R ankle MMT in all planes of motion, weak hip musculature BIL, TTP to her R navicular which has persisted since her 2017 ankle surgery, and decreased balance. Further assessment of her knee and ankle are warranted at next visit. Her FOTO scores indicate a high level of diability related to her ankle>knee pain. She will benefit from skilled PT to address her primary impairments and return to her prior level of function without limitation.    Personal Factors and Comorbidities Comorbidity 3+;Fitness    Comorbidities See medical history above    Examination-Activity Limitations Locomotion Level;Transfers;Sit;Squat;Sleep;Dressing;Stairs;Stand    Examination-Participation Restrictions Cleaning;Driving;Laundry    Stability/Clinical Decision Making Evolving/Moderate complexity    Clinical Decision Making Moderate    Rehab Potential Good    PT Frequency 2x / week    PT Duration 8 weeks    PT Treatment/Interventions ADLs/Self Care  Home Management;Aquatic Therapy;Cryotherapy;Electrical Stimulation;DME Instruction;Ultrasound;Moist Heat;Gait training;Stair training;Functional mobility training;Therapeutic activities;Therapeutic exercise;Balance training;Neuromuscular re-education;Manual techniques;Patient/family education;Manual lymph drainage;Compression bandaging;Passive range of motion;Dry needling;Joint Manipulations;Taping    PT Next Visit Plan Assess Berg balance scale, LE sensation, ankle/knee ROM and passive accessories, progress LE strengthening    PT Home Exercise Plan [id number]    Consulted and Agree with Plan of Care Patient             Patient will benefit from skilled therapeutic intervention in order to improve the following deficits and impairments:  Abnormal gait, Decreased range of motion, Difficulty walking, Obesity, Decreased endurance, Decreased activity tolerance, Pain, Improper body mechanics, Impaired flexibility, Hypomobility, Decreased balance, Decreased mobility, Decreased strength, Impaired sensation, Postural dysfunction, Increased edema  Visit Diagnosis: Chronic pain of right knee  Pain in right ankle and joints of right foot  Unsteadiness on feet  Muscle weakness (generalized)     Problem List Patient Active Problem List   Diagnosis Date Noted   Mild depression ([place]) 08/30/2020   Arthritis of right ankle 07/25/2020   Right knee pain 06/21/2020   Sinus pain 03/01/2020   Bleeding nose 03/01/2020   Depression 11/21/2019   Moderate episode of recurrent major depressive disorder ([place]) 09/22/2019   Generalized anxiety disorder 09/22/2019   Bipolar I disorder, most recent episode depressed ([place]) 09/22/2019   Bilateral knee pain 03/14/2019   Osteoarthritis 11/24/2018   Bipolar disorder ([place]) 04/29/2017   HLD (hyperlipidemia) 03/19/2017   Asthma exacerbation 03/14/2017   Weight gain 12/06/2016   MDD (major depressive disorder), recurrent severe, without psychosis ([place]) 05/11/2015    Dyspnea 05/09/2015   Essential hypertension    Vitamin D deficiency 04/11/2015   Fatigue 04/10/2015   Non compliance w medication regimen 04/10/2015   Thyroid activity decreased    Adjustment disorder with mixed anxiety and depressed mood    Cough 09/20/2014   Grief at loss of child 06/22/2014   Dysmenorrhea 11/25/2013   Suicidal ideation 05/27/2011   CKD (chronic kidney disease) stage 3, GFR 30-59 ml/min ([facility]) 11/06/2010   ENDOMETRIAL HYPERPLASIA UNSPECIFIED 02/07/2008  MENORRHAGIA 01/03/2008   Allergic rhinitis 06/24/2007   Essential hypertension, benign 02/03/2007   Hypothyroidism 05/07/2006   Type II diabetes mellitus with complication ([place]) 50/93/2671   Morbid obesity ([place]) 05/07/2006   Iron deficiency anemia 05/07/2006   Anxiety state 05/07/2006   OBSESSIVE COMPUL. DISORDER 05/07/2006    Vanessa Gordon, PT, DPT 09/13/20 10:10 PM   [facility] [address] [place], [place], [address] Phone: [phone]   Fax:  [phone]  Name: [name] MRN: [id number] Date of Birth: 16-Jun-1970  PHYSICAL THERAPY DISCHARGE SUMMARY  Visits from Start of Care: 1  Current functional level related to goals / functional outcomes: Unable to assess   Remaining deficits: Unable to assess   Education / Equipment: HEP   Patient agrees to discharge. Patient goals were not met. Patient is being discharged due to not returning since the last visit.  Vanessa Fenton, PT, DPT 11/19/20 8:18 AM

## 2020-09-18 ENCOUNTER — Other Ambulatory Visit: Payer: Self-pay | Admitting: Family Medicine

## 2020-09-18 DIAGNOSIS — F411 Generalized anxiety disorder: Secondary | ICD-10-CM

## 2020-09-18 DIAGNOSIS — E038 Other specified hypothyroidism: Secondary | ICD-10-CM

## 2020-09-21 ENCOUNTER — Other Ambulatory Visit: Payer: Self-pay | Admitting: Family Medicine

## 2020-09-27 NOTE — Progress Notes (Signed)
    SUBJECTIVE:   CHIEF COMPLAINT / HPI: f/u BP & dysuria   HTN  Patient reports she has been out of her blood pressure medications for 5 days.  She states that she has been having headaches and blurry vision related to her blood pressure.  She has not checked her blood pressure at home.  Patient states that she takes lisinopril and metoprolol for blood pressure.  Dysuria  Patient reports that she has been having burning with urination but thinks this is due to the actual vaginal tissue irritation more so than the area.  She also noticed that her urine was dark.  She is unsure of how much fluid intake she has been in the past few days.  Patient reports that it is very sore to touch her labia and is painful to sit for too long.  She reports that she has been having abnormal vaginal bleeding stating that this has been occurring for the last few months.  She reports that she has been having vaginal bleeding associated with the pain.  Patient reports some vaginal discharge which is Brown fluid.  She does not have any lower abdominal pain  PERTINENT  PMH / PSH:  HTN  Hypothyroidism  Diabetes   OBJECTIVE:   BP (!) 185/99   Pulse (!) 115   Wt (!) 398 lb 12.8 oz (180.9 kg)   LMP 08/31/2020   SpO2 98%   BMI 55.62 kg/m   General: female appearing stated age, anxious and uncomfortable appearing  Cardio: Normal S1 and S2, no S3 or S4. Rhythm is regular Pulm: Clear to auscultation bilaterally, no crackles, wheezing, or diminished breath sounds. Normal respiratory effort, stable on RA  Genitalia:  Normal introitus for age, no external lesions, scant brown vaginal discharge, mucosa pink and moist, no vaginal lesions/lacerations, severe tenderness with manipulation of labia, unable to insert speculum or swab for wet prep  ASSESSMENT/PLAN:   Essential hypertension Blood pressure significantly elevated today.  Patient reports that she has been out of blood pressure medications for about 5 days.    -Continue amlodipine, metoprolol and lisinopril as prescribed -Recommend patient follow-up in 1 week -BMP collected today   Vaginal irritation Patient reports external and internal vaginal irritation.  Unable to view any abnormalities such as lesions or lacerations on exam.  Unable to complete full genital exam due to tenderness. Will prescribe Diflucan empirically given patient's elevated hemoglobin A1c We will also recommend vaginal lubrication as patient is likely in perimenopause given menstrual irregularities U/a Discussed which hazel, pad cooling pad to apply to the vaginal area, patient voiced understanding Recommend follow-up in 1 week    Eulis Foster, MD [place]

## 2020-09-28 ENCOUNTER — Ambulatory Visit (INDEPENDENT_AMBULATORY_CARE_PROVIDER_SITE_OTHER): Payer: BC Managed Care – PPO | Admitting: Family Medicine

## 2020-09-28 ENCOUNTER — Other Ambulatory Visit: Payer: Self-pay

## 2020-09-28 ENCOUNTER — Encounter: Payer: Self-pay | Admitting: Family Medicine

## 2020-09-28 VITALS — BP 185/99 | HR 115 | Wt 398.8 lb

## 2020-09-28 DIAGNOSIS — N898 Other specified noninflammatory disorders of vagina: Secondary | ICD-10-CM | POA: Insufficient documentation

## 2020-09-28 DIAGNOSIS — N1831 Chronic kidney disease, stage 3a: Secondary | ICD-10-CM | POA: Diagnosis not present

## 2020-09-28 DIAGNOSIS — E118 Type 2 diabetes mellitus with unspecified complications: Secondary | ICD-10-CM | POA: Diagnosis not present

## 2020-09-28 DIAGNOSIS — R300 Dysuria: Secondary | ICD-10-CM | POA: Diagnosis not present

## 2020-09-28 DIAGNOSIS — I10 Essential (primary) hypertension: Secondary | ICD-10-CM | POA: Diagnosis not present

## 2020-09-28 DIAGNOSIS — Z1159 Encounter for screening for other viral diseases: Secondary | ICD-10-CM

## 2020-09-28 LAB — POCT URINALYSIS DIP (MANUAL ENTRY)
Bilirubin, UA: NEGATIVE
Glucose, UA: 100 mg/dL — AB
Ketones, POC UA: NEGATIVE mg/dL
Nitrite, UA: NEGATIVE
Protein Ur, POC: 30 mg/dL — AB
Spec Grav, UA: 1.025
Urobilinogen, UA: 0.2 U/dL
pH, UA: 5.5

## 2020-09-28 LAB — POCT WET PREP (WET MOUNT)
Clue Cells Wet Prep Whiff POC: NEGATIVE
Trichomonas Wet Prep HPF POC: ABSENT

## 2020-09-28 LAB — POCT GLYCOSYLATED HEMOGLOBIN (HGB A1C): HbA1c, POC (controlled diabetic range): 11 % — AB (ref 0.0–7.0)

## 2020-09-28 LAB — POCT UA - MICROSCOPIC ONLY: Epithelial cells, urine per micros: >20

## 2020-09-28 MED ORDER — LISINOPRIL 5 MG PO TABS: 5.0000 mg | ORAL_TABLET | Freq: Every day | ORAL | 0 refills | Status: DC

## 2020-09-28 MED ORDER — FLUCONAZOLE 150 MG PO TABS: 150.0000 mg | ORAL_TABLET | Freq: Once | ORAL | 0 refills | Status: AC

## 2020-09-28 MED ORDER — METOPROLOL SUCCINATE ER 25 MG PO TB24: 25.0000 mg | ORAL_TABLET | Freq: Every day | ORAL | 0 refills | Status: DC

## 2020-09-28 NOTE — Assessment & Plan Note (Signed)
>>  ASSESSMENT AND PLAN FOR HTN (HYPERTENSION) WRITTEN ON 09/28/2020  9:02 AM BY SIMMONS-ROBINSON, MAKIERA, MD  Blood pressure significantly elevated today.  Patient reports that she has been out of blood pressure medications for about 5 days.   -Continue amlodipine, metoprolol and lisinopril as prescribed -Recommend patient follow-up in 1 week -BMP collected today

## 2020-09-28 NOTE — Patient Instructions (Signed)
It was a pleasure to see you today!  Thank you for choosing [facility] for your primary care.   [name] was seen for vaginal irritation and elevated blood pressure.   Our plans for today were: For your blood pressure, I will refill your medications.  Please take this daily.  We will also collect blood work to check your electrolyte levels and kidney function. For your vaginal irritation, I will treat you for what I believe may be a yeast infection.  We will attempt to study the specimen today but may not have been able to gather and of material to do an adequate study.  I recommended she follow-up with [name] in 1 week and let us know if your symptoms worsen or do not improve.  I will also prescribe vaginal lubrication cream as you are likely experiencing perimenopause in may be having irritation due to increasing vaginal dryness.  To keep you healthy, please keep in mind the following health maintenance items that you are due for:   COVID-vaccine Diabetes eye exam Diabetes foot exam Pap smear   You should return to our clinic in 1 week for follow up for vaginal irritation.   Best Wishes,   Dr. Alba Cory

## 2020-09-28 NOTE — Assessment & Plan Note (Addendum)
Patient reports external and internal vaginal irritation.  Unable to view any abnormalities such as lesions or lacerations on exam.  Unable to complete full genital exam due to tenderness. Will prescribe Diflucan empirically given patient's elevated hemoglobin A1c We will also recommend vaginal lubrication as patient is likely in perimenopause given menstrual irregularities U/a Discussed which hazel, pad cooling pad to apply to the vaginal area, patient voiced understanding Recommend follow-up in 1 week

## 2020-09-28 NOTE — Assessment & Plan Note (Signed)
Blood pressure significantly elevated today.  Patient reports that she has been out of blood pressure medications for about 5 days.   -Continue amlodipine, metoprolol and lisinopril as prescribed -Recommend patient follow-up in 1 week -BMP collected today

## 2020-09-29 LAB — BASIC METABOLIC PANEL
BUN/Creatinine Ratio: 18 (ref 9–23)
BUN: 23 mg/dL (ref 6–24)
CO2: 21 mmol/L (ref 20–29)
Calcium: 9.1 mg/dL (ref 8.7–10.2)
Chloride: 104 mmol/L (ref 96–106)
Creatinine, Ser: 1.28 mg/dL — ABNORMAL HIGH (ref 0.57–1.00)
Glucose: 209 mg/dL — ABNORMAL HIGH (ref 65–99)
Potassium: 4.8 mmol/L (ref 3.5–5.2)
Sodium: 140 mmol/L (ref 134–144)
eGFR: 51 mL/min/{1.73_m2} — ABNORMAL LOW (ref >59)

## 2020-09-29 LAB — HEPATITIS C ANTIBODY: Hep C Virus Ab: <0.1 s/co ratio (ref 0.0–0.9)

## 2020-10-01 ENCOUNTER — Ambulatory Visit (INDEPENDENT_AMBULATORY_CARE_PROVIDER_SITE_OTHER): Payer: BC Managed Care – PPO | Admitting: Family Medicine

## 2020-10-01 ENCOUNTER — Telehealth: Payer: Self-pay

## 2020-10-01 ENCOUNTER — Other Ambulatory Visit: Payer: Self-pay

## 2020-10-01 ENCOUNTER — Encounter: Payer: Self-pay | Admitting: Family Medicine

## 2020-10-01 ENCOUNTER — Other Ambulatory Visit (HOSPITAL_COMMUNITY)
Admission: RE | Admit: 2020-10-01 | Discharge: 2020-10-01 | Disposition: A | Discharge status: Home / Self Care | Payer: BC Managed Care – PPO | Source: Ambulatory Visit | Attending: Family Medicine | Admitting: Family Medicine

## 2020-10-01 VITALS — HR 101 | Ht 71.0 in | Wt >= 6400 oz

## 2020-10-01 DIAGNOSIS — R102 Pelvic and perineal pain: Secondary | ICD-10-CM | POA: Insufficient documentation

## 2020-10-01 DIAGNOSIS — N3001 Acute cystitis with hematuria: Secondary | ICD-10-CM | POA: Diagnosis not present

## 2020-10-01 DIAGNOSIS — R399 Unspecified symptoms and signs involving the genitourinary system: Secondary | ICD-10-CM

## 2020-10-01 LAB — POCT URINALYSIS DIP (MANUAL ENTRY)
Bilirubin, UA: NEGATIVE
Glucose, UA: NEGATIVE mg/dL
Ketones, POC UA: NEGATIVE mg/dL
Nitrite, UA: NEGATIVE
Protein Ur, POC: NEGATIVE mg/dL
Spec Grav, UA: 1.025 (ref 1.010–1.025)
Urobilinogen, UA: 0.2 E.U./dL
pH, UA: 5.5 (ref 5.0–8.0)

## 2020-10-01 LAB — POCT WET PREP (WET MOUNT)
Clue Cells Wet Prep Whiff POC: NEGATIVE
Trichomonas Wet Prep HPF POC: ABSENT

## 2020-10-01 LAB — POCT UA - MICROSCOPIC ONLY

## 2020-10-01 MED ORDER — CEPHALEXIN 500 MG PO CAPS: 500.0000 mg | ORAL_CAPSULE | Freq: Four times a day (QID) | ORAL | 0 refills | Status: AC

## 2020-10-01 NOTE — Progress Notes (Signed)
         SUBJECTIVE:   CHIEF COMPLAINT / HPI:   Chief Complaint  Patient presents with   Dysuria    [name] is a 50 y.o. female presents for dysuria. Pt is sexually active with her husband. He has never had similar sx previously. Seen in [facility] for similar compliants and treated for yeast infection. Pt reports medication made sx worse. Describes significant pain with urination sometimes with tears. Denies vaginal discharge, gross blood in urine, fevers, back pain and nausea. Endorses pelvic pain. Denies possible STD exposure. Husband said her vaginal area was red. No rash or sores noted. She does have her period intermittently. Patient's last menstrual period was 09/01/2020.  Patient has uncontrolled DM.   PERTINENT  PMH / PSH: reviewed and updated as appropriate   OBJECTIVE:   BP (!) (P) 150/102   Pulse (!) 101   Ht '5\' 11"'$  (1.803 m)   Wt (!) 403 lb 2 oz (182.9 kg)   LMP 09/01/2020   SpO2 (P) 100%   BMI 56.22 kg/m   GEN: well appearing female in no acute distress  CVS: well perfused  RESP: speaking in full sentences without pause  ABD: soft, non-tender, non-distended, no palpable masses  Pelvic exam: normal external genitalia, vulva, VAGINA and CERVIX: lesions absent, cervical motion tenderness absent, white clumpy discharge in vaginal canal, ADNEXA: not attempted due to exam discomfort, WET MOUNT done - results: excessive bacteria, exam chaperoned by CMA Tashira.     ASSESSMENT/PLAN:   No problem-specific Assessment & Plan notes found for this encounter.    Urinary Tract Infection  History and UA concerning for UTI.  Hematuria not supported on microscopy.  Wet mount unremarkable for yeast, trich or BV. Patient never had a UTI. Will treat with Keflex 4 times daily for 5 days.  Urine culture obtained.   - Follow-up sensitivities and change antibiotics if needed.  Patient agrees with plan. - Follow up GC/CT, treat if positive     Lyndee Hensen, DO [place]

## 2020-10-01 NOTE — Telephone Encounter (Signed)
Patient was able to schedule same-day appointment.  Issue addressed.  Please see visit note from Dr. Susa Simmonds.

## 2020-10-01 NOTE — Patient Instructions (Signed)
Stop by the pharmacy to pick up your antibiotics.  Take four times a day for the next 5 days.   Take Care,  Dr. Susa Simmonds

## 2020-10-01 NOTE — Telephone Encounter (Signed)
Patient calls nurse line regarding continued vaginal irritation and burning with urination. Patient took 1 dose treatment of diflucan with little relief.   Please advise if additional treatment can be sent in.   Patient is agreeable to follow up appointment, however, we do not have anything until Wednesday.   Provided with urgent care/ ED precautions.   Please advise how patient should proceed.   Talbot Grumbling, RN

## 2020-10-01 NOTE — Telephone Encounter (Signed)
Patient returns call to nurse line to check status of previous message.   Please advise.   Talbot Grumbling, RN

## 2020-10-03 LAB — URINE CULTURE

## 2020-10-03 LAB — CERVICOVAGINAL ANCILLARY ONLY
Chlamydia: NEGATIVE
Comment: NEGATIVE
Comment: NORMAL
Neisseria Gonorrhea: NEGATIVE

## 2020-10-08 ENCOUNTER — Telehealth: Payer: Self-pay

## 2020-10-08 MED ORDER — FLUCONAZOLE 150 MG PO TABS: 150.0000 mg | ORAL_TABLET | ORAL | 0 refills | Status: AC

## 2020-10-08 MED ORDER — A&D EX OINT: 1.0000 "application " | TOPICAL_OINTMENT | Freq: Three times a day (TID) | CUTANEOUS | 0 refills | Status: DC | PRN

## 2020-10-08 NOTE — Telephone Encounter (Signed)
Patient calls nurse line requesting prescription for diflucan after finishing course of abx for UTI. Patient is also requesting cream to alleviate discomfort.   Patient reports vaginal irritation and itching.   Patient states she was told to call in if she developed these symptoms and she could receive treatment without having to come in for an additional appointment.   Please advise.   Talbot Grumbling, RN

## 2020-10-11 DIAGNOSIS — Z20822 Contact with and (suspected) exposure to covid-19: Secondary | ICD-10-CM | POA: Insufficient documentation

## 2020-10-11 DIAGNOSIS — I129 Hypertensive chronic kidney disease with stage 1 through stage 4 chronic kidney disease, or unspecified chronic kidney disease: Secondary | ICD-10-CM | POA: Diagnosis not present

## 2020-10-11 DIAGNOSIS — Z7984 Long term (current) use of oral hypoglycemic drugs: Secondary | ICD-10-CM | POA: Diagnosis not present

## 2020-10-11 DIAGNOSIS — N183 Chronic kidney disease, stage 3 unspecified: Secondary | ICD-10-CM | POA: Diagnosis not present

## 2020-10-11 DIAGNOSIS — Z91040 Latex allergy status: Secondary | ICD-10-CM | POA: Insufficient documentation

## 2020-10-11 DIAGNOSIS — Z794 Long term (current) use of insulin: Secondary | ICD-10-CM | POA: Diagnosis not present

## 2020-10-11 DIAGNOSIS — Z79899 Other long term (current) drug therapy: Secondary | ICD-10-CM | POA: Insufficient documentation

## 2020-10-11 DIAGNOSIS — E1122 Type 2 diabetes mellitus with diabetic chronic kidney disease: Secondary | ICD-10-CM | POA: Diagnosis not present

## 2020-10-11 DIAGNOSIS — E039 Hypothyroidism, unspecified: Secondary | ICD-10-CM | POA: Insufficient documentation

## 2020-10-11 DIAGNOSIS — Z7951 Long term (current) use of inhaled steroids: Secondary | ICD-10-CM | POA: Insufficient documentation

## 2020-10-11 DIAGNOSIS — J4521 Mild intermittent asthma with (acute) exacerbation: Secondary | ICD-10-CM | POA: Diagnosis not present

## 2020-10-11 DIAGNOSIS — R0602 Shortness of breath: Secondary | ICD-10-CM | POA: Diagnosis present

## 2020-10-12 ENCOUNTER — Encounter (HOSPITAL_COMMUNITY): Payer: Self-pay | Admitting: Emergency Medicine

## 2020-10-12 ENCOUNTER — Emergency Department (HOSPITAL_COMMUNITY)
Admission: EM | Admit: 2020-10-12 | Discharge: 2020-10-12 | Disposition: A | Discharge status: Home / Self Care | Payer: BC Managed Care – PPO | Attending: Emergency Medicine | Admitting: Emergency Medicine

## 2020-10-12 ENCOUNTER — Other Ambulatory Visit: Payer: Self-pay

## 2020-10-12 ENCOUNTER — Emergency Department (HOSPITAL_COMMUNITY): Payer: BC Managed Care – PPO

## 2020-10-12 DIAGNOSIS — J4521 Mild intermittent asthma with (acute) exacerbation: Secondary | ICD-10-CM

## 2020-10-12 LAB — SARS CORONAVIRUS 2 (TAT 6-24 HRS): SARS Coronavirus 2: NEGATIVE

## 2020-10-12 MED ORDER — ALBUTEROL SULFATE HFA 108 (90 BASE) MCG/ACT IN AERS
2.0000 | INHALATION_SPRAY | RESPIRATORY_TRACT | Status: DC | PRN
  Administered 2020-10-12: 2 via RESPIRATORY_TRACT
  Filled 2020-10-12: qty 6.7

## 2020-10-12 MED ORDER — PREDNISONE 20 MG PO TABS
60.0000 mg | ORAL_TABLET | Freq: Once | ORAL | Status: AC
  Administered 2020-10-12: 60 mg via ORAL
  Filled 2020-10-12: qty 3

## 2020-10-12 MED ORDER — PREDNISONE 20 MG PO TABS: ORAL_TABLET | ORAL | 0 refills | Status: DC

## 2020-10-12 MED ORDER — IPRATROPIUM-ALBUTEROL 0.5-2.5 (3) MG/3ML IN SOLN
3.0000 mL | Freq: Once | RESPIRATORY_TRACT | Status: AC
  Administered 2020-10-12: 3 mL via RESPIRATORY_TRACT
  Filled 2020-10-12: qty 3

## 2020-10-12 MED ORDER — BENZONATATE 100 MG PO CAPS: 100.0000 mg | ORAL_CAPSULE | Freq: Three times a day (TID) | ORAL | 0 refills | Status: DC

## 2020-10-12 MED ORDER — IPRATROPIUM-ALBUTEROL 0.5-2.5 (3) MG/3ML IN SOLN: 3.0000 mL | Freq: Three times a day (TID) | RESPIRATORY_TRACT | 0 refills | Status: DC | PRN

## 2020-10-12 NOTE — ED Provider Notes (Signed)
[facility] EMERGENCY DEPARTMENT Provider Note   CSN: [id number] Arrival date & time: 10/11/20  2328     History Chief Complaint  Patient presents with   Asthma    [name] is a 50 y.o. female.  The history is provided by the patient and medical records. No language interpreter was used.  Asthma   50 year old female significant history of asthma, anxiety, diabetes, hypertension presenting for evaluation of shortness of breath.  Patient report for the past 5 days she has been having shortness of breath and wheezing nonproductive cough with  pain in her chest with coughing.  Endorses mild sore throat from persistent coughing.  No fever chills runny nose nausea vomiting diarrhea loss of taste or smell.  She has been vaccinated for COVID-19 she denies any recent sick contact.  She rarely uses her inhaler unless she has an attack like this.  She mention she did try using her home nebulizer which has been expired for at least 3 years.  She did not notice any significant improvement initially.  No prior history of PE or DVT.  Past Medical History:  Diagnosis Date   Acute renal failure ([place]) 05/27/10   hemodialysis for 6 weeks   Anxiety    Asthma    Depression    Diabetes mellitus    Hypertension    Rhabdomyolysis 06/06/10   after drug overdose   Suicide attempt by drug ingestion ([place]) 06/05/10   result rhabdomyolosis and ARF requrining dialysis     Patient Active Problem List   Diagnosis Date Noted   Vaginal irritation 09/28/2020   Mild depression ([place]) 08/30/2020   Arthritis of right ankle 07/25/2020   Right knee pain 06/21/2020   Sinus pain 03/01/2020   Bleeding nose 03/01/2020   Depression 11/21/2019   Moderate episode of recurrent major depressive disorder ([place]) 09/22/2019   Generalized anxiety disorder 09/22/2019   Bipolar I disorder, most recent episode depressed ([place]) 09/22/2019   Bilateral knee pain 03/14/2019   Osteoarthritis 11/24/2018    Bipolar disorder ([place]) 04/29/2017   HLD (hyperlipidemia) 03/19/2017   Asthma exacerbation 03/14/2017   Weight gain 12/06/2016   MDD (major depressive disorder), recurrent severe, 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   Dyspnea 05/09/2015   Essential hypertension    Vitamin D deficiency 04/11/2015   Fatigue 04/10/2015   Non compliance w medication regimen 04/10/2015   Thyroid activity decreased    Adjustment disorder with mixed anxiety and depressed mood    Cough 09/20/2014   Grief at loss of child 06/22/2014   Dysmenorrhea 11/25/2013   Suicidal ideation 05/27/2011   CKD (chronic kidney disease) stage 3, GFR 30-59 ml/min ([place]) 11/06/2010   ENDOMETRIAL HYPERPLASIA UNSPECIFIED 02/07/2008   MENORRHAGIA 01/03/2008   Allergic rhinitis 06/24/2007   Essential hypertension, benign 02/03/2007   Hypothyroidism 05/07/2006   Type II diabetes mellitus with complication ([place]) [id number]   Morbid obesity ([place]) 05/07/2006   Iron deficiency anemia 05/07/2006   Anxiety state 05/07/2006   OBSESSIVE COMPUL. DISORDER 05/07/2006    Past Surgical History:  Procedure Laterality Date   ANKLE ARTHROSCOPY Right 08/17/2015   Procedure: RIGHT ANKLE ARTHROSCOPY WITH SYNOVECTOMY AND LOOSE BODY EXCISION;  Surgeon: Melrose Nakayama, MD;  Location: [facility];  Service: Orthopedics;  Laterality: Right;   CHOLECYSTECTOMY     TUBAL LIGATION  1998     OB History   No obstetric history on file.     Family History  Problem Relation Age of Onset   Asthma Father  Social History   Tobacco Use   Smoking status: Never   Smokeless tobacco: Never  Vaping Use   Vaping Use: Never used  Substance Use Topics   Alcohol use: No   Drug use: No    Home Medications Prior to Admission medications   Medication Sig Start Date End Date Taking? Authorizing Provider  A&D OINT Apply 1 application topically 3 (three) times daily as needed. 10/08/20   Brimage, Ronnette Juniper, DO  albuterol (VENTOLIN HFA) 108 (90 Base) MCG/ACT inhaler  Inhale 2 puffs into the lungs every 4 (four) hours as needed for wheezing or shortness of breath. 06/21/20   [name], DO  budesonide-formoterol (SYMBICORT) 80-4.5 MCG/ACT inhaler Inhale 2 puffs into the lungs in the morning and at bedtime. 08/09/20   Zenia Resides, MD  Cariprazine HCl 6 MG CAPS Take 1 capsule (6 mg total) by mouth daily. 06/07/20   Salley Slaughter, NP  ferrous sulfate 325 (65 FE) MG tablet Take 1 tablet (325 mg total) by mouth 3 (three) times daily with meals. Patient taking differently: Take 325 mg by mouth daily. Take every other day with stool softer 12/04/16   Verner Mould, MD  fluconazole (DIFLUCAN) 150 MG tablet Take 1 tablet (150 mg total) by mouth every 3 (three) days for 2 doses. 10/08/20 10/12/20  Lyndee Hensen, DO  FLUoxetine (PROZAC) 40 MG capsule Take 2 capsules (80 mg total) by mouth daily. 08/30/20   Salley Slaughter, NP  fluticasone (FLONASE) 50 MCG/ACT nasal spray Place 1 spray into both nostrils daily. 1 spray in each nostril every day 03/01/20   Daisy Floro, DO  hydrOXYzine (VISTARIL) 50 MG capsule Take 1 capsule (50 mg total) by mouth 3 (three) times daily as needed for itching. 08/30/20   Salley Slaughter, NP  insulin aspart (NOVOLOG) 100 UNIT/ML FlexPen Inject 10 units with Lunch and 15 units with Dinner 08/09/20   Zenia Resides, MD  Insulin Glargine (BASAGLAR KWIKPEN) 100 UNIT/ML Inject 50 Units into the skin 2 (two) times daily. Take 50U twice a day 06/21/20   [name], DO  ipratropium-albuterol (DUONEB) 0.5-2.5 (3) MG/3ML SOLN Take 3 mLs by nebulization 3 (three) times daily as needed. 11/14/17   Ledell Noss, MD  levothyroxine (SYNTHROID) 300 MCG tablet Take 1 tablet by mouth once daily 09/19/20   [name], DO  lisinopril (ZESTRIL) 5 MG tablet Take 1 tablet (5 mg total) by mouth daily. 09/28/20   Simmons-Robinson, Makiera, MD  LORazepam (ATIVAN) 1 MG tablet TAKE 1 TABLET BY MOUTH EVERY 8 HOURS AS NEEDED FOR  ANXIETY **DO  NOT  FILL  LESS  THAN  30  DAYS  FROM  LAST  REFILL** 09/19/20   [name], DO  Melatonin 3 MG CAPS Take 1 capsule (3 mg total) by mouth at bedtime. 06/07/20   Salley Slaughter, NP  metFORMIN (GLUCOPHAGE-XR) 500 MG 24 hr tablet Take 2 tablets (1,000 mg total) by mouth 2 (two) times daily with a meal. 08/09/20   Hensel, Jamal Collin, MD  metoprolol succinate (TOPROL-XL) 25 MG 24 hr tablet Take 1 tablet (25 mg total) by mouth daily. 09/28/20   Simmons-Robinson, Riki Sheer, MD  mirtazapine (REMERON) 30 MG tablet Take 1 tablet (30 mg total) by mouth daily. 08/30/20   Salley Slaughter, NP  pantoprazole (PROTONIX) 40 MG tablet Take 1 tablet (40 mg total) by mouth daily. 11/22/18   Lockamy, Christia Reading, DO  Semaglutide, 2 MG/DOSE, (OZEMPIC, 2 MG/DOSE,) 8 MG/3ML SOPN Inject  2 mg into the skin once a week. 08/09/20   Zenia Resides, MD  Vitamin D, Ergocalciferol, (DRISDOL) 50000 units CAPS capsule Take 1 capsule (50,000 Units total) by mouth every 7 (seven) days. Sunday 12/04/16   Verner Mould, MD    Allergies    Ace inhibitors, Haldol [haloperidol lactate], Nsaids, and Latex  Review of Systems   Review of Systems  All other systems reviewed and are negative.  Physical Exam Updated Vital Signs BP 132/87   Pulse 90   Temp 98 F (36.7 C) (Oral)   Resp 20   Ht '5\' 11"'$  (1.803 m)   Wt (!) 190 kg   LMP 09/20/2020   SpO2 100%   BMI 58.42 kg/m   Physical Exam Vitals and nursing note reviewed.  Constitutional:      General: She is not in acute distress.    Appearance: She is well-developed. She is obese.     Comments: Obese female speaking in complete sentences appears to be in no acute respiratory distress however is coughing profusely  HENT:     Head: Atraumatic.  Eyes:     Conjunctiva/sclera: Conjunctivae normal.  Cardiovascular:     Rate and Rhythm: Normal rate and regular rhythm.  Pulmonary:     Effort: Pulmonary effort is normal.     Breath sounds: Wheezing  (Mild faint wheezes heard but lungs otherwise clear.) present.  Abdominal:     Palpations: Abdomen is soft.     Tenderness: There is no abdominal tenderness.  Musculoskeletal:     Cervical back: Neck supple.     Right lower leg: No edema.     Left lower leg: No edema.  Skin:    Findings: No rash.  Neurological:     Mental Status: She is alert.  Psychiatric:        Mood and Affect: Mood normal.    ED Results / Procedures / Treatments   Labs (all labs ordered are listed, but only abnormal results are displayed) Labs Reviewed  SARS CORONAVIRUS 2 (TAT 6-24 HRS)    EKG None  Radiology DG Chest Portable 1 View  Result Date: 10/12/2020 CLINICAL DATA:  50 year old female with history of cough and wheezing. EXAM: PORTABLE CHEST 1 VIEW COMPARISON:  Chest x-ray 11/13/2017. FINDINGS: Lung volumes are normal. No consolidative airspace disease. No pleural effusions. No pneumothorax. No pulmonary nodule or mass noted. Pulmonary vasculature and the cardiomediastinal silhouette are within normal limits. IMPRESSION: No radiographic evidence of acute cardiopulmonary disease. Electronically Signed   By: Vinnie Langton M.D.   On: 10/12/2020 07:33    Procedures Procedures   Medications Ordered in ED Medications  albuterol (VENTOLIN HFA) 108 (90 Base) MCG/ACT inhaler 2 puff (2 puffs Inhalation Given 10/12/20 0729)  ipratropium-albuterol (DUONEB) 0.5-2.5 (3) MG/3ML nebulizer solution 3 mL (3 mLs Nebulization Given 10/12/20 0110)  predniSONE (DELTASONE) tablet 60 mg (60 mg Oral Given 10/12/20 [id number])    ED Course  I have reviewed the triage vital signs and the nursing notes.  Pertinent labs & imaging results that were available during my care of the patient were reviewed by me and considered in my medical decision making (see chart for details).    MDM Rules/Calculators/A&P                           BP (!) 150/102 (BP Location: Left Arm)   Pulse 98   Temp 98.5 F (36.9 C) (Oral)   Resp Marland Kitchen)  22    Ht '5\' 11"'$  (1.803 m)   Wt (!) 190 kg   LMP 09/20/2020   SpO2 100%   BMI 58.42 kg/m   Final Clinical Impression(s) / ED Diagnoses Final diagnoses:  Mild intermittent asthma with acute exacerbation    Rx / DC Orders ED Discharge Orders          Ordered    ipratropium-albuterol (DUONEB) 0.5-2.5 (3) MG/3ML SOLN  3 times daily PRN        10/12/20 0752    predniSONE (DELTASONE) 20 MG tablet        10/12/20 0752    benzonatate (TESSALON) 100 MG capsule  Every 8 hours        10/12/20 0752           Patient with history of asthma here with complaints of shortness of breath and wheezing.  She did receive a DuoNeb when he was first evaluated in the waiting room.  She did report some improvement.  She has minimal wheezes on exam.  She is mostly coughing.  Will obtain chest x-ray, provide rescue inhaler, prednisone, and obtain COVID test.  At this time I have low suspicion for PE causing her symptoms.  I suspect this is likely an asthma exacerbation.  7:54 AM Chest x-ray unremarkable.  Patient satting at 100% on room air.  Will discharge home with prescription of DuoNeb, as well as steroid and cough medication.  Return precaution given.   Domenic Moras, PA-C 10/12/20 [id number]    Carmin Muskrat, MD 10/12/20 [phone]

## 2020-10-12 NOTE — Discharge Instructions (Addendum)
You have been evaluated for your symptoms which is likely due to an asthma exacerbation.  Please use rescue inhaler 2 puffs every 4 hours as needed for shortness of breath.  You may continue using your nebulizer as needed.  Take Tessalon for your cough.  You may take prednisone as prescribed.  Please be aware that prednisone may cause your blood sugar to elevate therefore you need to monitor your blood sugar carefully and address it appropriately.  Return to the ER if you have any concern.

## 2020-10-12 NOTE — ED Triage Notes (Signed)
Patient reports asthma attack with wheezing and persistent productive cough onset this week unrelieved by inhaler and nebulizer.

## 2020-10-12 NOTE — ED Provider Notes (Signed)
Emergency Medicine Provider Triage Evaluation Note  [name] , a 50 y.o. female  was evaluated in triage.  Pt complains of shortness of breath.  The patient endorses constant, worsening shortness of breath over the last 5 days accompanied by wheezing, nonproductive cough.  Patient is having chest pain with coughing, but pain resolves after coughing episodes subside.  Cough has been progressively worsening since onset.  Chest pain is not pleuritic.  No fever, chills, sore throat, nasal congestion, rhinorrhea, abdominal pain, nausea, vomiting, diarrhea, or leg swelling.  She states that her symptoms feel similar to previous asthma exacerbations.  She has attempted to use her home albuterol at home nebulizer machine, but medication was expired.  No history of ICU admissions or intubations secondary to asthma exacerbation.  Review of Systems  Positive: Wheezing, shortness of breath, chest pain with coughing, cough Negative: Abdominal pain, nausea, vomiting, diarrhea, fever, chills, nasal congestion, rhinorrhea, leg swelling  Physical Exam  BP (!) 156/91 (BP Location: Left Arm)   Pulse 97   Temp 98 F (36.7 C) (Oral)   Resp (!) 24   Ht '5\' 11"'$  (1.803 m)   Wt (!) 190 kg   LMP 09/20/2020   SpO2 100%   BMI 58.42 kg/m  Gen:   Awake, no distress   Resp:  Mild tachypnea, lung sounds are diminished throughout, violent coughing noted with deep inspiration, wheezes audible during expiration with coughing episodes MSK:   Moves extremities without difficulty  Other:  Abdomen is nondistended  Medical Decision Making  Medically screening exam initiated at 1:04 AM.  Appropriate orders placed.  CORDELLA STINNER was informed that the remainder of the evaluation will be completed by another provider, this initial triage assessment does not replace that evaluation, and the importance of remaining in the ED until their evaluation is complete.  Patient's work-up has been initiated in the ED.  Will order  DuoNeb.  Patient will likely require multiple DuoNeb's and corticosteroids.  She will require further work-up and evaluation in the ED.   Joanne Gavel, PA-C 10/12/20 0104    Quintella Reichert, MD 10/16/20 1425

## 2020-10-23 ENCOUNTER — Other Ambulatory Visit: Payer: Self-pay | Admitting: Family Medicine

## 2020-10-23 DIAGNOSIS — F411 Generalized anxiety disorder: Secondary | ICD-10-CM

## 2020-11-20 ENCOUNTER — Ambulatory Visit: Payer: BC Managed Care – PPO | Admitting: Family Medicine

## 2020-11-23 ENCOUNTER — Other Ambulatory Visit: Payer: Self-pay

## 2020-11-23 ENCOUNTER — Emergency Department (HOSPITAL_COMMUNITY): Payer: BC Managed Care – PPO

## 2020-11-23 ENCOUNTER — Emergency Department (HOSPITAL_COMMUNITY)
Admission: EM | Admit: 2020-11-23 | Discharge: 2020-11-24 | Disposition: A | Discharge status: Home / Self Care | Payer: BC Managed Care – PPO | Attending: Emergency Medicine | Admitting: Emergency Medicine

## 2020-11-23 ENCOUNTER — Encounter (HOSPITAL_COMMUNITY): Payer: Self-pay

## 2020-11-23 ENCOUNTER — Encounter: Payer: Self-pay | Admitting: Family Medicine

## 2020-11-23 ENCOUNTER — Ambulatory Visit (INDEPENDENT_AMBULATORY_CARE_PROVIDER_SITE_OTHER): Payer: BC Managed Care – PPO | Admitting: Family Medicine

## 2020-11-23 VITALS — BP 185/76 | HR 106 | Wt >= 6400 oz

## 2020-11-23 DIAGNOSIS — J45901 Unspecified asthma with (acute) exacerbation: Secondary | ICD-10-CM

## 2020-11-23 DIAGNOSIS — Z79899 Other long term (current) drug therapy: Secondary | ICD-10-CM | POA: Insufficient documentation

## 2020-11-23 DIAGNOSIS — Z7984 Long term (current) use of oral hypoglycemic drugs: Secondary | ICD-10-CM | POA: Insufficient documentation

## 2020-11-23 DIAGNOSIS — E1122 Type 2 diabetes mellitus with diabetic chronic kidney disease: Secondary | ICD-10-CM | POA: Diagnosis not present

## 2020-11-23 DIAGNOSIS — R0603 Acute respiratory distress: Secondary | ICD-10-CM | POA: Insufficient documentation

## 2020-11-23 DIAGNOSIS — I10 Essential (primary) hypertension: Secondary | ICD-10-CM | POA: Diagnosis not present

## 2020-11-23 DIAGNOSIS — Z91040 Latex allergy status: Secondary | ICD-10-CM | POA: Diagnosis not present

## 2020-11-23 DIAGNOSIS — I129 Hypertensive chronic kidney disease with stage 1 through stage 4 chronic kidney disease, or unspecified chronic kidney disease: Secondary | ICD-10-CM | POA: Diagnosis not present

## 2020-11-23 DIAGNOSIS — Z794 Long term (current) use of insulin: Secondary | ICD-10-CM | POA: Diagnosis not present

## 2020-11-23 DIAGNOSIS — J4541 Moderate persistent asthma with (acute) exacerbation: Secondary | ICD-10-CM | POA: Diagnosis not present

## 2020-11-23 DIAGNOSIS — N9489 Other specified conditions associated with female genital organs and menstrual cycle: Secondary | ICD-10-CM | POA: Insufficient documentation

## 2020-11-23 DIAGNOSIS — R0602 Shortness of breath: Secondary | ICD-10-CM | POA: Diagnosis present

## 2020-11-23 DIAGNOSIS — E039 Hypothyroidism, unspecified: Secondary | ICD-10-CM | POA: Diagnosis not present

## 2020-11-23 DIAGNOSIS — Z7952 Long term (current) use of systemic steroids: Secondary | ICD-10-CM | POA: Diagnosis not present

## 2020-11-23 DIAGNOSIS — Z20822 Contact with and (suspected) exposure to covid-19: Secondary | ICD-10-CM | POA: Insufficient documentation

## 2020-11-23 DIAGNOSIS — N183 Chronic kidney disease, stage 3 unspecified: Secondary | ICD-10-CM | POA: Insufficient documentation

## 2020-11-23 LAB — RESP PANEL BY RT-PCR (FLU A&B, COVID) ARPGX2
Influenza A by PCR: NEGATIVE
Influenza B by PCR: NEGATIVE
SARS Coronavirus 2 by RT PCR: NEGATIVE

## 2020-11-23 LAB — I-STAT BETA HCG BLOOD, ED (MC, WL, AP ONLY): I-stat hCG, quantitative: <5 m[IU]/mL (ref <5)

## 2020-11-23 MED ORDER — PREDNISONE 50 MG PO TABS: 50.0000 mg | ORAL_TABLET | Freq: Every day | ORAL | 0 refills | Status: DC

## 2020-11-23 MED ORDER — METHYLPREDNISOLONE SODIUM SUCC 125 MG IJ SOLR
60.0000 mg | Freq: Once | INTRAMUSCULAR | Status: AC
  Administered 2020-11-23: 60 mg via INTRAMUSCULAR

## 2020-11-23 MED ORDER — ALBUTEROL SULFATE HFA 108 (90 BASE) MCG/ACT IN AERS: 2.0000 | INHALATION_SPRAY | RESPIRATORY_TRACT | 0 refills | Status: DC | PRN

## 2020-11-23 MED ORDER — PREDNISONE 20 MG PO TABS
60.0000 mg | ORAL_TABLET | Freq: Once | ORAL | Status: AC
  Administered 2020-11-23: 60 mg via ORAL
  Filled 2020-11-23: qty 3

## 2020-11-23 MED ORDER — METHYLPREDNISOLONE SODIUM SUCC 125 MG IJ SOLR: 80.0000 mg | Freq: Once | INTRAMUSCULAR | Status: DC

## 2020-11-23 MED ORDER — IPRATROPIUM-ALBUTEROL 0.5-2.5 (3) MG/3ML IN SOLN
3.0000 mL | RESPIRATORY_TRACT | Status: AC | PRN
Start: 2020-11-23 — End: 2020-11-23
  Administered 2020-11-23 (×5): 3 mL via RESPIRATORY_TRACT
  Filled 2020-11-23 (×2): qty 3
  Filled 2020-11-23 (×2): qty 9

## 2020-11-23 NOTE — ED Provider Notes (Signed)
[facility] EMERGENCY DEPARTMENT Provider Note   CSN: [id number] Arrival date & time: 11/23/20  1703     History Chief Complaint  Patient presents with   Respiratory Distress    [name] is a 50 y.o. female with PMHx HTN, DM, asthma, morbid obesity, who presents for evaluation of breath.   Patient reports an approximately 3-day history of cough productive of clear sputum, with associated shortness of breath.  She states that over this time, her shortness of breath has gradually worsened.  She notes experiencing wheezing today, which was refractory to her home regiment of albuterol MDI.  She denies experiencing fever, chills, chest pain, or sore throat.  She presented to the family medicine clinic today, at which time she was advised to present to emergency department for further evaluation.  EMS was called, the patient was transported to our facility.  Of note, patient had a recent ED encounter in early August for similar symptoms, where she was diagnosed with an asthma exacerbation and treated with both DuoNebs and prednisone.     Past Medical History:  Diagnosis Date   Acute renal failure ([place]) 05/27/10   hemodialysis for 6 weeks   Anxiety    Asthma    Depression    Diabetes mellitus    Hypertension    Rhabdomyolysis 06/06/10   after drug overdose   Suicide attempt by drug ingestion ([place]) 06/05/10   result rhabdomyolosis and ARF requrining dialysis     Patient Active Problem List   Diagnosis Date Noted   Vaginal irritation 09/28/2020   Mild depression ([place]) 08/30/2020   Arthritis of right ankle 07/25/2020   Right knee pain 06/21/2020   Sinus pain 03/01/2020   Bleeding nose 03/01/2020   Depression 11/21/2019   Moderate episode of recurrent major depressive disorder ([place]) 09/22/2019   Generalized anxiety disorder 09/22/2019   Bipolar I disorder, most recent episode depressed ([place]) 09/22/2019   Bilateral knee pain 03/14/2019   Osteoarthritis  11/24/2018   Bipolar disorder ([place]) 04/29/2017   HLD (hyperlipidemia) 03/19/2017   Asthma exacerbation 03/14/2017   Weight gain 12/06/2016   MDD (major depressive disorder), recurrent severe, 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   Dyspnea 05/09/2015   Essential hypertension    Vitamin D deficiency 04/11/2015   Fatigue 04/10/2015   Non compliance w medication regimen 04/10/2015   Thyroid activity decreased    Adjustment disorder with mixed anxiety and depressed mood    Cough 09/20/2014   Grief at loss of child 06/22/2014   Dysmenorrhea 11/25/2013   Suicidal ideation 05/27/2011   CKD (chronic kidney disease) stage 3, GFR 30-59 ml/min ([place]) 11/06/2010   ENDOMETRIAL HYPERPLASIA UNSPECIFIED 02/07/2008   MENORRHAGIA 01/03/2008   Allergic rhinitis 06/24/2007   Essential hypertension, benign 02/03/2007   Hypothyroidism 05/07/2006   Type II diabetes mellitus with complication ([place]) [id number]   Morbid obesity ([place]) 05/07/2006   Iron deficiency anemia 05/07/2006   Anxiety state 05/07/2006   OBSESSIVE COMPUL. DISORDER 05/07/2006    Past Surgical History:  Procedure Laterality Date   ANKLE ARTHROSCOPY Right 08/17/2015   Procedure: RIGHT ANKLE ARTHROSCOPY WITH SYNOVECTOMY AND LOOSE BODY EXCISION;  Surgeon: Melrose Nakayama, MD;  Location: [facility];  Service: Orthopedics;  Laterality: Right;   CHOLECYSTECTOMY     TUBAL LIGATION  1998     OB History   No obstetric history on file.     Family History  Problem Relation Age of Onset   Asthma Father     Social History  Tobacco Use   Smoking status: Never   Smokeless tobacco: Never  Vaping Use   Vaping Use: Never used  Substance Use Topics   Alcohol use: No   Drug use: No    Home Medications Prior to Admission medications   Medication Sig Start Date End Date Taking? Authorizing Provider  A&D OINT Apply 1 application topically 3 (three) times daily as needed. 10/08/20   Brimage, Ronnette Juniper, DO  albuterol (VENTOLIN HFA) 108 (90 Base)  MCG/ACT inhaler Inhale 2 puffs into the lungs every 4 (four) hours as needed for wheezing or shortness of breath. 11/23/20   Violet Baldy, MD  benzonatate (TESSALON) 100 MG capsule Take 1 capsule (100 mg total) by mouth every 8 (eight) hours. Patient not taking: Reported on 11/23/2020 10/12/20   Domenic Moras, PA-C  budesonide-formoterol [facility]) 80-4.5 MCG/ACT inhaler Inhale 2 puffs into the lungs in the morning and at bedtime. Patient not taking: Reported on 11/23/2020 08/09/20   Zenia Resides, MD  Cariprazine HCl 6 MG CAPS Take 1 capsule (6 mg total) by mouth daily. 06/07/20   Salley Slaughter, NP  ferrous sulfate 325 (65 FE) MG tablet Take 1 tablet (325 mg total) by mouth 3 (three) times daily with meals. Patient taking differently: Take 325 mg by mouth daily. Take every other day with stool softer 12/04/16   Verner Mould, MD  FLUoxetine (PROZAC) 40 MG capsule Take 2 capsules (80 mg total) by mouth daily. 08/30/20   Salley Slaughter, NP  fluticasone (FLONASE) 50 MCG/ACT nasal spray Place 1 spray into both nostrils daily. 1 spray in each nostril every day 03/01/20   Daisy Floro, DO  hydrOXYzine (VISTARIL) 50 MG capsule Take 1 capsule (50 mg total) by mouth 3 (three) times daily as needed for itching. 08/30/20   Salley Slaughter, NP  insulin aspart (NOVOLOG) 100 UNIT/ML FlexPen Inject 10 units with Lunch and 15 units with Dinner 08/09/20   Zenia Resides, MD  Insulin Glargine (BASAGLAR KWIKPEN) 100 UNIT/ML Inject 50 Units into the skin 2 (two) times daily. Take 50U twice a day 06/21/20   [name], DO  ipratropium-albuterol (DUONEB) 0.5-2.5 (3) MG/3ML SOLN Take 3 mLs by nebulization 3 (three) times daily as needed. 10/12/20   Domenic Moras, PA-C  levothyroxine (SYNTHROID) 300 MCG tablet Take 1 tablet by mouth once daily 09/19/20   [name], DO  lisinopril (ZESTRIL) 5 MG tablet Take 1 tablet (5 mg total) by mouth daily. 09/28/20   Simmons-Robinson, Makiera, MD   LORazepam (ATIVAN) 1 MG tablet TAKE 1 TABLET BY MOUTH EVERY 8 HOURS AS NEEDED ANXIETY 10/24/20   [name], DO  Melatonin 3 MG CAPS Take 1 capsule (3 mg total) by mouth at bedtime. 06/07/20   Salley Slaughter, NP  metFORMIN (GLUCOPHAGE-XR) 500 MG 24 hr tablet Take 2 tablets (1,000 mg total) by mouth 2 (two) times daily with a meal. 08/09/20   Hensel, Jamal Collin, MD  metoprolol succinate (TOPROL-XL) 25 MG 24 hr tablet Take 1 tablet (25 mg total) by mouth daily. 09/28/20   Simmons-Robinson, Riki Sheer, MD  mirtazapine (REMERON) 30 MG tablet Take 1 tablet (30 mg total) by mouth daily. 08/30/20   Salley Slaughter, NP  pantoprazole (PROTONIX) 40 MG tablet Take 1 tablet (40 mg total) by mouth daily. 11/22/18   Nuala Alpha, MD  predniSONE (DELTASONE) 50 MG tablet Take 1 tablet (50 mg total) by mouth daily. 11/23/20   Violet Baldy, MD  Semaglutide, 2 MG/DOSE, ([place],  2 MG/DOSE,) 8 MG/3ML SOPN Inject 2 mg into the skin once a week. 08/09/20   Zenia Resides, MD  Vitamin D, Ergocalciferol, (DRISDOL) 50000 units CAPS capsule Take 1 capsule (50,000 Units total) by mouth every 7 (seven) days. Sunday 12/04/16   Verner Mould, MD    Allergies    Ace inhibitors, Haldol [haloperidol lactate], Nsaids, and Latex  Review of Systems   Review of Systems  Constitutional:  Negative for chills and fever.  HENT:  Negative for ear pain and sore throat.   Eyes:  Negative for pain and visual disturbance.  Respiratory:  Positive for cough, shortness of breath and wheezing.   Cardiovascular:  Negative for chest pain and palpitations.  Gastrointestinal:  Negative for abdominal pain and vomiting.  Genitourinary:  Negative for dysuria and hematuria.  Musculoskeletal:  Negative for arthralgias and back pain.  Skin:  Negative for color change and rash.  Neurological:  Negative for seizures and syncope.  All other systems reviewed and are negative.  Physical Exam Updated Vital Signs BP (!) 169/90    Pulse (!) 103   Temp 98 F (36.7 C) (Axillary)   Resp (!) 23   SpO2 95%   Physical Exam Vitals and nursing note reviewed.  Constitutional:      General: She is not in acute distress.    Appearance: She is well-developed.  HENT:     Head: Normocephalic and atraumatic.  Eyes:     Conjunctiva/sclera: Conjunctivae normal.  Cardiovascular:     Rate and Rhythm: Normal rate and regular rhythm.     Heart sounds: No murmur heard. Pulmonary:     Effort: Respiratory distress present.     Breath sounds: Wheezing present.     Comments: Significantly increased work and rate of breathing.  Diffuse wheezing with diminished breath sounds throughout. Abdominal:     Palpations: Abdomen is soft.     Tenderness: There is no abdominal tenderness.  Musculoskeletal:     Cervical back: Neck supple.  Skin:    General: Skin is warm and dry.     Capillary Refill: Capillary refill takes less than 2 seconds.  Neurological:     General: No focal deficit present.     Mental Status: She is alert and oriented to person, place, and time. Mental status is at baseline.     Cranial Nerves: No cranial nerve deficit.     Sensory: No sensory deficit.     Motor: No weakness.    ED Results / Procedures / Treatments   Labs (all labs ordered are listed, but only abnormal results are displayed) Labs Reviewed  RESP PANEL BY RT-PCR (FLU A&B, COVID) [id number]  I-STAT BETA HCG BLOOD, ED ([facility], [facility], [facility] ONLY)   EKG EKG Interpretation  Date/Time:  Friday November 23 2020 17:08:30 EDT Ventricular Rate:  108 PR Interval:  190 QRS Duration: 119 QT Interval:  365 QTC Calculation: [id number] R Axis:   39 Text Interpretation: Sinus tachycardia Nonspecific intraventricular conduction delay Low voltage, extremity and precordial leads Anteroseptal infarct, old Minimal ST depression, inferior leads Confirmed by Thamas Jaegers ([id number]) on 11/23/2020 9:46:57 PM Also confirmed by Thamas Jaegers ([id number]), editor Stetler, Angela [phone])  on  11/24/2020 9:31:36 AM  Radiology DG Chest Port 1 View  Result Date: 11/23/2020 CLINICAL DATA:  Shortness of breath, cough EXAM: PORTABLE CHEST 1 VIEW COMPARISON:  None. FINDINGS: Poor penetration particularly at the lung bases due to technique and habitus. The heart size and mediastinal contours are within  normal limits. Both lungs are clear. No pleural effusion. The visualized skeletal structures are unremarkable. IMPRESSION: No acute process in the chest. Electronically Signed   By: Macy Mis M.D.   On: 11/23/2020 18:05    Procedures Procedures   Medications Ordered in ED Medications  ipratropium-albuterol (DUONEB) 0.5-2.5 (3) MG/3ML nebulizer solution 3 mL (3 mLs Nebulization Given 11/23/20 2133)  predniSONE (DELTASONE) tablet 60 mg (60 mg Oral Given 11/23/20 1724)    ED Course  I have reviewed the triage vital signs and the nursing notes.  Pertinent labs & imaging results that were available during my care of the patient were reviewed by me and considered in my medical decision making (see chart for details).    MDM Rules/Calculators/A&P                           50 y.o. female with past medical history as above who presents for evaluation of shortness of breath and wheezing in the context of a history of asthma. Afebrile and hemodynamically stable.  Nonhypoxic on room air.  Exam as detailed above.  COVID-19 testing negative.  Pregnancy testing negative.  EKG was sinus tachycardia but no evidence of ischemia or arrhythmia.  Chest x-ray without acute cardiopulmonary process.  No evidence of pneumothorax or pneumonia.  I have low suspicion for pulmonary embolism.  Low suspicion for heart failure exacerbation.  No evidence of ACS given no acute changes on EKG or chest pain verbalized and story it sounds typical for ACS.  Patient presentation is consistent with acute asthma exacerbation.  Patient was treated here in the emergency department with back-to-back DuoNeb's and prednisone with  significant improvement.  Patient was provided with a refill for her albuterol, as well as a short course of prednisone.  Patient will follow-up closely with PCP.   Final Clinical Impression(s) / ED Diagnoses Final diagnoses:  Exacerbation of asthma, unspecified asthma severity, unspecified whether persistent  Moderate persistent asthma with exacerbation    Rx / DC Orders ED Discharge Orders          Ordered    albuterol (VENTOLIN HFA) 108 (90 Base) MCG/ACT inhaler  Every 4 hours PRN,   Status:  Discontinued        11/23/20 2314    predniSONE (DELTASONE) 50 MG tablet  Daily,   Status:  Discontinued        11/23/20 2314    albuterol (VENTOLIN HFA) 108 (90 Base) MCG/ACT inhaler  Every 4 hours PRN        11/23/20 2330    predniSONE (DELTASONE) 50 MG tablet  Daily        11/23/20 2330             Violet Baldy, MD 11/24/20 1339    Luna Fuse, MD 11/24/20 [phone]

## 2020-11-23 NOTE — Patient Instructions (Signed)
Asthma Attack Asthma attack, also called acute bronchospasm, is the sudden narrowing and tightening of the air passages, which limits the amount of oxygen that can get into the lungs. The narrowing is caused by inflammation and tightening of the muscles in the air tubes (bronchi) in the lungs. Too much mucus is also produced, which narrows the airways more. This can cause trouble breathing, loud breathing (wheezing), and coughing. The goal of treatment is to open the airways in the lungs and reduce inflammation. What are the causes? Possible causes or triggers of this condition include: Animal dander, dust mites, or cockroaches. Mold, pollen from trees or grass, or cold air. Air pollutants such as dust, household cleaners, aerosol sprays, strong chemicals, strong odors, and smoke of any kind. Stress or strong emotions such as crying or laughing hard. Exercise or activity that requires a lot of energy. Substances in foods and drinks, such as dried fruits and wine, called sulfites. Certain medicines or medical conditions such as: Aspirin or beta-blockers. Infections or inflammatory conditions, such as a flu (influenza), a cold, pneumonia, or inflammation of the nasal membranes (rhinitis). Gastroesophageal reflux disease (GERD). GERD is a condition in which stomach acid backs up into your esophagus and spills into your trachea (windpipe), which can irritate your airways. What are the signs or symptoms? Symptoms of this condition include: Wheezing. This may sound like whistling while breathing. This may only happen at night. Excessive coughing. This may only happen at night. Chest tightness or pain. Shortness of breath. Feeling like you cannot get enough air no matter how hard you breathe (air hunger). How is this diagnosed? This condition may be diagnosed based on: Your medical history. Your symptoms. A physical exam. Tests to check for other causes of your symptoms or other conditions that  may have triggered your asthma attack. These tests may include: A chest X-ray. Blood tests. Tests to assess lung function, such as breathing into a device that measures how much air you can inhale and exhale (spirometry). How is this treated? Treatment for this condition depends on the severity and cause of your asthma attack. For mild attacks, you may receive medicines through a hand-held inhaler (metered dose inhaler, or MDI) or through a device that turns liquid medicine into a mist (nebulizer). These medicines include: Quick relief or rescue medicines that quickly relax the airways and lungs. Long-acting medicines that are used daily to prevent (control) your asthma symptoms. For moderate or severe attacks, you may be treated with steroid medicines by mouth or through an IV injection at the hospital. For severe attacks, you may need oxygen therapy or a breathing machine (ventilator). If your asthma attack was caused by an infection from bacteria, you will be given antibiotic medicines. Follow these instructions at home: Medicines Take over-the-counter and prescription medicines only as told by your health care provider. Keep your medicines up-to-date. Make sure you have all of your medicines available at all times. If you were prescribed an antibiotic medicine, take it as told by your health care provider. Do not stop taking the antibiotic even if you start to feel better. Tell your doctor if you may be pregnant to make sure your asthma medicine is safe to use during pregnancy. Avoiding triggers  Keep track of things that trigger your asthma attacks. Avoid exposure to these triggers. Do not use any products that contain nicotine or tobacco, such as cigarettes, e-cigarettes, and chewing tobacco. If you need help quitting, ask your health care provider. When there is  a lot of pollen, air pollution, or humidity, keep windows closed and use an air conditioner or go to places with air  conditioning. Asthma action plan Work with your health care provider to make a written plan for managing and treating your asthma attacks (asthma action plan). This plan should include: A list of your asthma triggers and how to avoid them. A list of symptoms that you may have during an asthma attack. Information about which medicine to take, when to take the medicine and how much of the medicine to take. Information to help you understand your peak flow measurements. Daily actions that you can take to control your asthma symptoms. Contact information for your health care providers. If you have an asthma attack, act quickly. Follow the emergency steps on your written asthma action plan. This may prevent you from needing to go to the hospital. General instructions Avoid excessive exercise or activity until your asthma attack goes away. Ask your health care provider what activities are safe for you and when you can return to your normal activities. Stay up to date on all your vaccines, such as flu and pneumonia vaccines. Drink enough fluid to keep your urine pale yellow. Staying hydrated helps keep mucus in your lungs thin so it can be coughed up easily. Do not use alcohol until you have recovered. Keep all follow-up visits as told by your health care provider. This is important. Asthma requires careful medical care. Contact a health care provider if: You have followed your action plan for 1 hour and your peak flow reading is still at 50-79%. This is in the yellow zone, which means "caution." You need to use your quick reliever medicine more frequently than normal. Your medicines are causing side effects, such as rash, itching, swelling, or trouble breathing. Your symptoms do not improve after 48 hours. You cough up mucus that is thicker than usual. You have a fever. Get help right away if: Your peak flow reading is less than 50% of your personal best. This is in the red zone, which means  "danger." You have trouble breathing. You develop chest pain or discomfort. Your medicines no longer seem to be helping. You are coughing up bloody mucus. You have a fever and your symptoms suddenly get worse. You have trouble swallowing. You feel very tired, and breathing becomes tiring. These symptoms may represent a serious problem that is an emergency. Do not wait to see if the symptoms will go away. Get medical help right away. Call your local emergency services (911 in the [place] Do not drive yourself to the hospital. Summary Asthma attacks are caused by narrowing or tightness in air passages, which causes shortness of breath, coughing, and loud breathing (wheezing). Many things can trigger an asthma attack, such as allergens, weather changes, exercise, strong odors, and smoke of any kind. If you have an asthma attack, act quickly. Follow the emergency steps on your written asthma action plan. Get help right away if you have severe trouble breathing, chest pain, or fever, or if your home medicines are no longer helping with your symptoms. This information is not intended to replace advice given to you by your health care provider. Make sure you discuss any questions you have with your health care provider. Document Revised: 02/22/2019 Document Reviewed: 02/22/2019 Elsevier Patient Education  2022 [employer].

## 2020-11-23 NOTE — ED Notes (Signed)
ED Provider at bedside. 

## 2020-11-23 NOTE — Assessment & Plan Note (Signed)
>>  ASSESSMENT AND PLAN FOR HTN (HYPERTENSION) WRITTEN ON 11/23/2020  4:53 PM BY Janit Pagan T, MD  Elevated BP due to medication non-adherence just today. I suspect she will be admitted to the hospital and she can get BP management during hospitalization. F/U with PCP.

## 2020-11-23 NOTE — Assessment & Plan Note (Signed)
Elevated BP due to medication non-adherence just today. I suspect she will be admitted to the hospital and she can get BP management during hospitalization. F/U with PCP.

## 2020-11-23 NOTE — Progress Notes (Signed)
    SUBJECTIVE:   CHIEF COMPLAINT / HPI:   Cough/SOB/Wheezing: C/O respiratory symptoms, which started three days ago and worsened yesterday. She was seen at the ED for a similar presentation in August and was treated with steroids with some improvement. However, her symptoms worsened gradually right after she completed her treatment. Associated symptoms include coughing, wheezing, and shortness of breath. Pertinent negatives include no chest pain, fever, or sore throat. Her symptoms are worse at night time. Treatments tried: Albuterol inhaler and nebulizer more frequently than usual with no improvement. She had not been taking her Symbicort due to cost. Her past medical history is significant for asthma.  HTN: She did not take any of her BP meds today.  PERTINENT  PMH / PSH: PMX reviewed.  OBJECTIVE:   Vitals:   11/23/20 1612  BP: (!) 185/76  Pulse: (!) 106  SpO2: 96%  Weight: (!) 402 lb 6.4 oz (182.5 kg)    Physical Exam Vitals and nursing note reviewed.  Constitutional:      General: She is in acute distress.     Appearance: She is obese.  Cardiovascular:     Rate and Rhythm: Normal rate and regular rhythm.     Heart sounds: Normal heart sounds. No murmur heard. Pulmonary:     Effort: Respiratory distress present.     Breath sounds: Decreased air movement present. Decreased breath sounds present.     Comments: Air entry decreased B/L. Interestingly, no wheezing at this time.  Musculoskeletal:     Right lower leg: No edema.     Left lower leg: No edema.     ASSESSMENT/PLAN:   Asthma exacerbation Recent visit to the ED in August reviewed. In severe respiratory distress at the moment. Solumedrol 60 mg IM given during this visit. We are unable to administer Nebulizer at this time in our clinic due to COVID-19 pandemic. I discussed ED admission with her and her husband and they agreed with the plan. EMS arrived and transported her to the ED. I called and transfer her  care to the ED triage nurse - Mr. Cherylynn Ridges  Essential hypertension Elevated BP due to medication non-adherence just today. I suspect she will be admitted to the hospital and she can get BP management during hospitalization. F/U with PCP.   More than 50% of this 55-minute face-to-face encounter was spent on evaluation and coordination of care, including the transition of care to the emergency department.  Andrena Mews, MD [place]

## 2020-11-23 NOTE — Assessment & Plan Note (Signed)
Recent visit to the ED in August reviewed. In severe respiratory distress at the moment. Solumedrol 60 mg IM given during this visit. We are unable to administer Nebulizer at this time in our clinic due to COVID-19 pandemic. I discussed ED admission with her and her husband and they agreed with the plan. EMS arrived and transported her to the ED. I called and transfer her care to the ED triage nurse - Mr. Cherylynn Ridges

## 2020-11-23 NOTE — ED Notes (Signed)
Pt reporting that she feels a little better and feels like her breathing is doing better. Pt breathing appears better than when this RN took over pt care. Pt appears more comfortable. Pt given snacks and a drink at this time.

## 2020-11-23 NOTE — ED Triage Notes (Signed)
Was at [facility] with asthma exacerbation. Wheezing/SOB. 60 mg solumedrol given at [facility] and 1 duoneb by [employer]. Seen recently for same 2 weeks ago.

## 2020-11-23 NOTE — Assessment & Plan Note (Signed)
>>  ASSESSMENT AND PLAN FOR ASTHMA EXACERBATION WRITTEN ON 11/23/2020  4:52 PM BY Doreene Eland, MD  Recent visit to the ED in August reviewed. In severe respiratory distress at the moment. Solumedrol 60 mg IM given during this visit. We are unable to administer Nebulizer at this time in our clinic due to COVID-19 pandemic. I discussed ED admission with her and her husband and they agreed with the plan. EMS arrived and transported her to the ED. I called and transfer her care to the ED triage nurse - Mr. Margart Sickles

## 2020-11-24 NOTE — ED Notes (Signed)
Patient verbalizes understanding of discharge instructions. Opportunity for questioning and answers were provided. Armband removed by staff, pt discharged from ED via wheelchair.  

## 2020-11-26 ENCOUNTER — Other Ambulatory Visit: Payer: Self-pay | Admitting: Family Medicine

## 2020-11-26 DIAGNOSIS — F411 Generalized anxiety disorder: Secondary | ICD-10-CM

## 2020-11-26 MED FILL — Ipratropium Bromide Inhal Soln 0.02%: RESPIRATORY_TRACT | Qty: 2.5 | Status: AC

## 2020-11-26 MED FILL — Albuterol Sulfate Soln Nebu 0.083% (2.5 MG/3ML): RESPIRATORY_TRACT | Qty: 3 | Status: AC

## 2020-11-29 ENCOUNTER — Telehealth (HOSPITAL_COMMUNITY): Payer: BC Managed Care – PPO | Admitting: Psychiatry

## 2020-12-06 ENCOUNTER — Telehealth (HOSPITAL_COMMUNITY): Payer: Self-pay | Admitting: *Deleted

## 2020-12-06 ENCOUNTER — Telehealth (HOSPITAL_COMMUNITY): Payer: Self-pay | Admitting: Psychiatry

## 2020-12-06 NOTE — Telephone Encounter (Signed)
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 She no showed for her appt on 11/29/20 with Dr Ronne Binning. She is and should be out of her medicine as she has not been seen for three months and got meds for three months. Her next appt is not until 02/19/21. Will ask  Dr Ronne Binning if she is willing to bridge her medicines till her appt in Dec.

## 2020-12-06 NOTE — Telephone Encounter (Signed)
Pt request refills all medications. Pt next scheduled Appt is 02/18/21.

## 2020-12-07 ENCOUNTER — Other Ambulatory Visit (HOSPITAL_COMMUNITY): Payer: Self-pay | Admitting: Psychiatry

## 2020-12-07 DIAGNOSIS — F32A Depression, unspecified: Secondary | ICD-10-CM

## 2020-12-07 DIAGNOSIS — F411 Generalized anxiety disorder: Secondary | ICD-10-CM

## 2020-12-07 DIAGNOSIS — F3130 Bipolar disorder, current episode depressed, mild or moderate severity, unspecified: Secondary | ICD-10-CM

## 2020-12-07 MED ORDER — FLUOXETINE HCL 40 MG PO CAPS: 80.0000 mg | ORAL_CAPSULE | Freq: Every day | ORAL | 3 refills | Status: DC

## 2020-12-07 MED ORDER — HYDROXYZINE PAMOATE 50 MG PO CAPS: 50.0000 mg | ORAL_CAPSULE | Freq: Three times a day (TID) | ORAL | 3 refills | Status: DC | PRN

## 2020-12-07 MED ORDER — MIRTAZAPINE 30 MG PO TABS: 30.0000 mg | ORAL_TABLET | Freq: Every day | ORAL | 3 refills | Status: DC

## 2020-12-07 NOTE — Telephone Encounter (Signed)
Medication refilled and sent to preferred pharmacy

## 2020-12-15 NOTE — Patient Instructions (Addendum)
It was wonderful to see you today.  Please bring ALL of your medications with you to every visit.   Today we talked about:  -You received your Flu shot and COVID booster today. -I will reach out to our pharmacy team to help with medication costs. -We are checking blood work to check your thyroid, cholesterol and Hgb A1c. I will contact you with the results.   Thank you for choosing [facility].   Please call [phone] with any questions about today's appointment.  Please be sure to schedule follow up at the front  desk before you leave today.   [name], DO PGY-2 Family Medicine

## 2020-12-15 NOTE — Progress Notes (Signed)
    SUBJECTIVE:   CHIEF COMPLAINT / HPI:   Medication Follow Up [name] presents to clinic today to discuss medications. Unfortunately, she did not bring her medications with her today.   She states she feels great today. Her mental health is in a great place, "I marked all 0's". She is enjoying her job and the people that she works with. Her only concern is that when she feels sick she gets "so sick". Specifically in terms of her breathing. She has had several asthma exacerbations in the past.   She has difficutly with affording medications. Has stopped taking Symbicort and Ozempic due to cost. She is the only financial provider in her family. She also recently had car troubles which cost her a lot of money.   PERTINENT  PMH / PSH:  Past Medical History:  Diagnosis Date   Acute renal failure ([place]) 05/27/10   hemodialysis for 6 weeks   Anxiety    Asthma    Depression    Diabetes mellitus    Hypertension    Rhabdomyolysis 06/06/10   after drug overdose   Suicide attempt by drug ingestion ([place]) 06/05/10   result rhabdomyolosis and ARF requrining dialysis     OBJECTIVE:   BP 130/85   Pulse 98   Ht '5\' 11"'$  (1.803 m)   Wt (!) 403 lb 3.2 oz (182.9 kg)   LMP 12/18/2020 (Exact Date)   SpO2 100%   BMI 56.23 kg/m    General: NAD, pleasant, well-dressed and groomed, able to participate in exam Cardiac: RRR, no murmurs. Respiratory: CTAB, normal effort, No wheezes, rales or rhonchi Abdomen: Obese Extremities: no edema or cyanosis. Skin: warm and dry, no rashes noted Psych: Normal affect and mood  ASSESSMENT/PLAN:   Essential hypertension BP is <140/90 today. It is improved from recent visits. She is asymptomatic. Continue current regimen. Encourage towards weight loss.   Hypothyroidism Compliant with Synthroid 300 mcg. Has not had a TSH checked since 2019, will check today. -F/u TSH; adjust medications as needed  Type II diabetes mellitus with complication (HCC) Hgb  123456 improved today from 11 to 9.3. Current regimen includes novolog 10U at lunch and 15U at dinner, Insulin Basaglar 50U BID, Metformin 1,000 mg BID. She stopped taking the Ozempic because she could not afford it. I will reach out to our pharmacy team to see what options are available to help with financial assistance. At her follow up in 51-month, can consider addition of SGLT-2 for renal protection.   HLD (hyperlipidemia) Lipid panel today. Patient is a diabetic and should be on a statin. Appears she was previously on Atorvastatin 40 mg, unclear why this was stopped or she stopped taking it. Attempted to call patient to further discuss but unable to get in contact.   Asthma History of asthma with multiple ED visits for exacerbations. She is not well controlled and unfortunately has a difficult time affording Symbicort. She is only using Duoneb's and Albuterol as needed. Lungs clear today and patient saturating well. Sent message to pharmacy team to see what options there are for financial assistance as I believe patient would benefit greatly from a controller medication.      ASharion Settler [place]

## 2020-12-18 ENCOUNTER — Encounter: Payer: Self-pay | Admitting: Family Medicine

## 2020-12-18 ENCOUNTER — Other Ambulatory Visit: Payer: Self-pay

## 2020-12-18 ENCOUNTER — Ambulatory Visit (INDEPENDENT_AMBULATORY_CARE_PROVIDER_SITE_OTHER): Payer: BC Managed Care – PPO

## 2020-12-18 ENCOUNTER — Ambulatory Visit (INDEPENDENT_AMBULATORY_CARE_PROVIDER_SITE_OTHER): Payer: BC Managed Care – PPO | Admitting: Family Medicine

## 2020-12-18 VITALS — BP 130/85 | HR 98 | Ht 71.0 in | Wt >= 6400 oz

## 2020-12-18 DIAGNOSIS — Z23 Encounter for immunization: Secondary | ICD-10-CM

## 2020-12-18 DIAGNOSIS — E782 Mixed hyperlipidemia: Secondary | ICD-10-CM | POA: Diagnosis not present

## 2020-12-18 DIAGNOSIS — J45909 Unspecified asthma, uncomplicated: Secondary | ICD-10-CM

## 2020-12-18 DIAGNOSIS — E039 Hypothyroidism, unspecified: Secondary | ICD-10-CM | POA: Diagnosis not present

## 2020-12-18 DIAGNOSIS — I10 Essential (primary) hypertension: Secondary | ICD-10-CM

## 2020-12-18 DIAGNOSIS — E118 Type 2 diabetes mellitus with unspecified complications: Secondary | ICD-10-CM | POA: Diagnosis not present

## 2020-12-18 LAB — POCT GLYCOSYLATED HEMOGLOBIN (HGB A1C): HbA1c, POC (controlled diabetic range): 9.7 % — AB (ref 0.0–7.0)

## 2020-12-18 NOTE — Assessment & Plan Note (Signed)
>>  ASSESSMENT AND PLAN FOR HTN (HYPERTENSION) WRITTEN ON 12/18/2020  5:22 PM BY ESPINOZA, ALEJANDRA, DO  BP is <140/90 today. It is improved from recent visits. She is asymptomatic. Continue current regimen. Encourage towards weight loss.

## 2020-12-18 NOTE — Assessment & Plan Note (Signed)
Hgb A1c improved today from 11 to 9.3. Current regimen includes novolog 10U at lunch and 15U at dinner, Insulin Basaglar 50U BID, Metformin 1,000 mg BID. She stopped taking the Ozempic because she could not afford it. I will reach out to our pharmacy team to see what options are available to help with financial assistance. At her follow up in 74-month, can consider addition of SGLT-2 for renal protection.

## 2020-12-18 NOTE — Assessment & Plan Note (Signed)
Lipid panel today. Patient is a diabetic and should be on a statin. Appears she was previously on Atorvastatin 40 mg, unclear why this was stopped or she stopped taking it. Attempted to call patient to further discuss but unable to get in contact.

## 2020-12-18 NOTE — Assessment & Plan Note (Signed)
Compliant with Synthroid 300 mcg. Has not had a TSH checked since 2019, will check today. -F/u TSH; adjust medications as needed

## 2020-12-18 NOTE — Assessment & Plan Note (Addendum)
BP is <140/90 today. It is improved from recent visits. She is asymptomatic. Continue current regimen. Encourage towards weight loss.

## 2020-12-18 NOTE — Assessment & Plan Note (Signed)
History of asthma with multiple ED visits for exacerbations. She is not well controlled and unfortunately has a difficult time affording Symbicort. She is only using Duoneb's and Albuterol as needed. Lungs clear today and patient saturating well. Sent message to pharmacy team to see what options there are for financial assistance as I believe patient would benefit greatly from a controller medication.

## 2020-12-20 ENCOUNTER — Other Ambulatory Visit: Payer: Self-pay | Admitting: Family Medicine

## 2020-12-20 DIAGNOSIS — F411 Generalized anxiety disorder: Secondary | ICD-10-CM

## 2020-12-20 DIAGNOSIS — E118 Type 2 diabetes mellitus with unspecified complications: Secondary | ICD-10-CM

## 2020-12-20 LAB — LIPID PANEL
Chol/HDL Ratio: 4.6 ratio — ABNORMAL HIGH (ref 0.0–4.4)
Cholesterol, Total: 227 mg/dL — ABNORMAL HIGH (ref 100–199)
HDL: 49 mg/dL (ref >39)
LDL Chol Calc (NIH): 125 mg/dL — ABNORMAL HIGH (ref 0–99)
Triglycerides: 303 mg/dL — ABNORMAL HIGH (ref 0–149)
VLDL Cholesterol Cal: 53 mg/dL — ABNORMAL HIGH (ref 5–40)

## 2020-12-20 LAB — TSH: TSH: 6.15 u[IU]/mL — ABNORMAL HIGH (ref 0.450–4.500)

## 2020-12-20 MED ORDER — BASAGLAR KWIKPEN 100 UNIT/ML ~~LOC~~ SOPN: 50.0000 [IU] | PEN_INJECTOR | Freq: Two times a day (BID) | SUBCUTANEOUS | 6 refills | Status: DC

## 2020-12-20 MED ORDER — INSULIN ASPART 100 UNIT/ML FLEXPEN: PEN_INJECTOR | SUBCUTANEOUS | 11 refills | Status: DC

## 2020-12-20 MED ORDER — ATORVASTATIN CALCIUM 40 MG PO TABS: 40.0000 mg | ORAL_TABLET | Freq: Every day | ORAL | 3 refills | Status: DC

## 2020-12-24 ENCOUNTER — Other Ambulatory Visit: Payer: Self-pay | Admitting: Family Medicine

## 2020-12-24 DIAGNOSIS — F411 Generalized anxiety disorder: Secondary | ICD-10-CM

## 2021-01-15 ENCOUNTER — Other Ambulatory Visit: Payer: Self-pay | Admitting: Family Medicine

## 2021-01-15 DIAGNOSIS — E118 Type 2 diabetes mellitus with unspecified complications: Secondary | ICD-10-CM

## 2021-01-15 MED ORDER — OZEMPIC (0.25 OR 0.5 MG/DOSE) 2 MG/1.5ML ~~LOC~~ SOPN
0.5000 mg | PEN_INJECTOR | SUBCUTANEOUS | 3 refills | Status: DC
Start: 2021-01-15 — End: 2021-04-15

## 2021-01-25 ENCOUNTER — Ambulatory Visit (INDEPENDENT_AMBULATORY_CARE_PROVIDER_SITE_OTHER): Payer: BC Managed Care – PPO | Admitting: Family Medicine

## 2021-01-25 ENCOUNTER — Other Ambulatory Visit: Payer: Self-pay

## 2021-01-25 VITALS — BP 136/96 | HR 97 | Ht 71.0 in | Wt >= 6400 oz

## 2021-01-25 DIAGNOSIS — R319 Hematuria, unspecified: Secondary | ICD-10-CM | POA: Diagnosis not present

## 2021-01-25 DIAGNOSIS — J45909 Unspecified asthma, uncomplicated: Secondary | ICD-10-CM

## 2021-01-25 LAB — POCT URINALYSIS DIP (MANUAL ENTRY)
Bilirubin, UA: NEGATIVE
Glucose, UA: NEGATIVE mg/dL
Ketones, POC UA: NEGATIVE mg/dL
Nitrite, UA: NEGATIVE
Protein Ur, POC: 30 mg/dL — AB
Spec Grav, UA: 1.02
Urobilinogen, UA: 0.2 U/dL
pH, UA: 5

## 2021-01-25 LAB — POCT UA - MICROSCOPIC ONLY: Epithelial cells, urine per micros: >20

## 2021-01-25 MED ORDER — CEPHALEXIN 500 MG PO CAPS: 500.0000 mg | ORAL_CAPSULE | Freq: Two times a day (BID) | ORAL | 0 refills | Status: AC

## 2021-01-25 MED ORDER — ALBUTEROL SULFATE (2.5 MG/3ML) 0.083% IN NEBU
2.5000 mg | INHALATION_SOLUTION | Freq: Once | RESPIRATORY_TRACT | Status: AC
  Administered 2021-01-25: 2.5 mg via RESPIRATORY_TRACT

## 2021-01-25 MED ORDER — FLUTICASONE FUROATE-VILANTEROL 200-25 MCG/ACT IN AEPB: 1.0000 | INHALATION_SPRAY | Freq: Every day | RESPIRATORY_TRACT | 0 refills | Status: DC

## 2021-01-25 MED ORDER — FLUCONAZOLE 150 MG PO TABS: 150.0000 mg | ORAL_TABLET | Freq: Once | ORAL | 0 refills | Status: AC

## 2021-01-25 MED ORDER — IPRATROPIUM BROMIDE 0.02 % IN SOLN
0.5000 mg | Freq: Once | RESPIRATORY_TRACT | Status: AC
  Administered 2021-01-25: 0.5 mg via RESPIRATORY_TRACT

## 2021-01-25 NOTE — Patient Instructions (Addendum)
It was great seeing you today!  Today you came in for urinary symptoms and shortness of breath and wheezing.  We gave you nebulizer treatment in the clinic, and gave you a sample of Breo Ellipta which you will use once daily.  I will place a referral to meet with the pharmacy team to discuss medication and trying to get you on medication you can afford.  For your UTI, I prescribed Keflex to use twice a day for 7 days.  As well as Diflucan if you develop a yeast infection.  Please check-out at the front desk before leaving the clinic. Schedule to see you PCP in the next month for follow up on asthma and other health maintenance items, but if you need to be seen earlier than that for any new issues we're happy to fit you in, just give [name] a call!  Feel free to call with any questions or concerns at any time, at [phone].   Take care,  Dr. Shary Key [facility]

## 2021-01-25 NOTE — Progress Notes (Signed)
    SUBJECTIVE:   CHIEF COMPLAINT / HPI:   [name] is a 50 yo who presents for asthma and increased work of breathing. States her breathing worsened Monday. States she uses the duo nebs 3 times a day and albuterol a couple times a day. Has not used breathing treatment or albuterol today. States she has not been taking her Symbicort because of the $50 copay. Denies recent infection, fevers.    Around July had a UTI. Now sees blood in urine , has frequency, hesitency  Symptoms started Monday. No vaginal discharge or burning. States last time she develoepd a yeast infection after the abx, requesting medication a head of time. No concern for STI    OBJECTIVE:   BP (!) 136/96   Pulse 97   Ht 5\' 11"  (1.803 m)   Wt (!) 400 lb 12.8 oz (181.8 kg)   SpO2 100%   BMI 55.90 kg/m    General: alert, obese, NAD CV: RRR no murmurs Resp: Significant wheezing in all lung fields. Increased WOB. Saturating 100% on RA GI: soft, non distended Derm: warm, dry, well perfused   ASSESSMENT/PLAN:   No problem-specific Assessment & Plan notes found for this encounter.   Asthma Patient presented with 5 days of worsening wheezing requiring use of rescue inhaler 2-3 times a day. States she has not been using her controller inhaler Symbicort due to cost. She requests a sample in the clinic. Given her diffuse wheezing and increased WOB on exam, gave her duonebs x 1 in the clinic and gave her a sample of Breo ellipta. Prescribed prednisone 40mg  x 5 days.   UTI frequency, hesitency, hematuria x 5 days. UA with trace leukocytes. Microscopy without RBC. Given her symptoms, will treat with Keflex 500 BID x 7 days. Also prescribed Diflucan given her hx of yeast infections after taking abx.   PCP follow up in the next month for f/u on asthma and other health maintenance items overdue    Shary Key, [place]

## 2021-01-28 ENCOUNTER — Other Ambulatory Visit: Payer: Self-pay | Admitting: Family Medicine

## 2021-01-28 DIAGNOSIS — F411 Generalized anxiety disorder: Secondary | ICD-10-CM

## 2021-02-13 ENCOUNTER — Other Ambulatory Visit: Payer: Self-pay | Admitting: Family Medicine

## 2021-02-13 ENCOUNTER — Other Ambulatory Visit (HOSPITAL_COMMUNITY): Payer: Self-pay

## 2021-02-15 ENCOUNTER — Telehealth: Payer: Self-pay

## 2021-02-15 NOTE — Telephone Encounter (Signed)
Left message regarding possible medication assistance for 2 medications (symbicort & ozempic). Requested call back to discuss options.   Call back # [phone]

## 2021-02-18 ENCOUNTER — Telehealth (HOSPITAL_COMMUNITY): Payer: BC Managed Care – PPO | Admitting: Psychiatry

## 2021-02-18 ENCOUNTER — Other Ambulatory Visit (HOSPITAL_COMMUNITY): Payer: Self-pay | Admitting: Psychiatry

## 2021-02-18 DIAGNOSIS — F32A Depression, unspecified: Secondary | ICD-10-CM

## 2021-02-18 DIAGNOSIS — F411 Generalized anxiety disorder: Secondary | ICD-10-CM

## 2021-02-18 MED ORDER — HYDROXYZINE PAMOATE 50 MG PO CAPS: 50.0000 mg | ORAL_CAPSULE | Freq: Three times a day (TID) | ORAL | 3 refills | Status: DC | PRN

## 2021-02-18 MED ORDER — MIRTAZAPINE 30 MG PO TABS: 30.0000 mg | ORAL_TABLET | Freq: Every day | ORAL | 3 refills | Status: DC

## 2021-02-18 MED ORDER — FLUOXETINE HCL 40 MG PO CAPS: 80.0000 mg | ORAL_CAPSULE | Freq: Every day | ORAL | 3 refills | Status: DC

## 2021-02-26 ENCOUNTER — Other Ambulatory Visit: Payer: Self-pay | Admitting: Family Medicine

## 2021-02-26 DIAGNOSIS — F411 Generalized anxiety disorder: Secondary | ICD-10-CM

## 2021-03-28 ENCOUNTER — Other Ambulatory Visit: Payer: Self-pay

## 2021-03-28 ENCOUNTER — Encounter: Payer: Self-pay | Admitting: Family Medicine

## 2021-03-28 ENCOUNTER — Ambulatory Visit (INDEPENDENT_AMBULATORY_CARE_PROVIDER_SITE_OTHER): Payer: BC Managed Care – PPO | Admitting: Family Medicine

## 2021-03-28 VITALS — BP 118/76 | HR 90 | Ht 71.0 in | Wt >= 6400 oz

## 2021-03-28 DIAGNOSIS — M25562 Pain in left knee: Secondary | ICD-10-CM | POA: Diagnosis not present

## 2021-03-28 DIAGNOSIS — G8929 Other chronic pain: Secondary | ICD-10-CM | POA: Diagnosis not present

## 2021-03-28 MED ORDER — METHYLPREDNISOLONE ACETATE 40 MG/ML IJ SUSP
40.0000 mg | Freq: Once | INTRAMUSCULAR | Status: AC
Start: 2021-03-28 — End: 2021-03-28
  Administered 2021-03-28: 40 mg via INTRAMUSCULAR

## 2021-03-28 NOTE — Patient Instructions (Signed)
You received a knee injection today.  Please contact her office if you have any issues such as fever, swelling of the site, increasing pain or tenderness.  Bili very important for you to follow-up soon and consistently with your PCP to address your further concerns.  I feel like the next step for you might end up being further discussion with orthopedics.

## 2021-03-28 NOTE — Progress Notes (Signed)
° ° °  SUBJECTIVE:   CHIEF COMPLAINT / HPI:   Chronic left knee pain Patient is that she has had chronic left knee pain for several years that has been a result of her right ankle issues that occurred 8 years ago.  She notes that she has had to wear a brace on her right ankle for the last 8 years and that orthopedics stated they would not be able to help her with that issue anymore.  She does note that for her knee pain, she saw sports medicine who stated they would not be able to help her and is very frustrated and would like to know who she should be seeing next for further care regarding the left knee pain.  She would like to have an injection today as she notes they have helped her for several months when she has gotten them in the past.  PERTINENT  PMH / PSH: Reviewed  OBJECTIVE:   BP 118/76    Pulse 90    Ht 5\' 11"  (1.803 m)    Wt (!) 412 lb 3.2 oz (187 kg)    LMP 03/07/2021    SpO2 100%    BMI 57.49 kg/m   General: NAD, well-appearing, well-nourished Respiratory: No respiratory distress, breathing comfortably, able to speak in full sentences Skin: warm and dry, no rashes noted on exposed skin Psych: Appropriate affect and mood Knee: - Inspection: no gross deformity. No swelling/effusion, erythema or bruising. Skin intact - Palpation: TTP over the medial joint - ROM: full active ROM with flexion and extension in knee though with significant pain  - Strength: 5/5 strength - Neuro/vasc: NV intact - Special Tests: - LIGAMENTS: negative anterior and posterior drawer  Procedure note: Left knee injection After informed written consent, timeout was performed.  Patient was seated on the exam table.  Left knee was prepped with alcohol swab.  Using the anterior medial approach, patient's left knee was injected intra-articularly with 3:1 lidocaine:Depo-Medrol.  Patient tolerated procedure well without immediate complications.  Counseled on return precautions.  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  Last injection completed was on the right knee in 06/2020.  Left knee injection was performed today as noted above.  Counseled patient that may need to consider orthopedic referral to consider intervention such as arthroscopy if appropriate in the left knee.  [name], [place]

## 2021-03-31 ENCOUNTER — Other Ambulatory Visit: Payer: Self-pay | Admitting: Family Medicine

## 2021-03-31 DIAGNOSIS — F411 Generalized anxiety disorder: Secondary | ICD-10-CM

## 2021-03-31 DIAGNOSIS — E038 Other specified hypothyroidism: Secondary | ICD-10-CM

## 2021-04-14 NOTE — Patient Instructions (Addendum)
It was wonderful to see you today.  Please bring ALL of your medications with you to every visit.   Today we talked about:  -We are adjusting your insulin regimen: Increase your Novolog to 15U at lunch and 20U at dinner. Take 65 units of the Basiglar twice a day.  -I am sending a referral to Ortho and Bariatric surgery for further discussion about possible next steps. -I am sending referral to gastroenterology for a screening colonoscopy for colon cancer screening. -You are overdue for breast cancer screening, I have ordered a mammogram.  You will need to call to schedule appointment at your earliest convenience. Their number is [phone]. -You are overdue for an eye exam, please call to schedule appointment at your earliest convenience. -I sent in an order for the shingles vaccine to your pharmacy so that you can complete the shingles series for protection. -Please schedule an appointment at your earliest convenience for a Pap smear, you are overdue. -We are doing lab work today to check your cholesterol. I will send you a MyChart message if you have MyChart. Otherwise, I will give you a call for abnormal results or send a letter if everything returned back normal. If you don't hear from me in 2 weeks, please call the office.     Thank you for choosing [facility].   Please call [phone] with any questions about today's appointment.  Please be sure to schedule follow up at the front  desk before you leave today.   [name], DO PGY-2 Family Medicine

## 2021-04-14 NOTE — Progress Notes (Signed)
SUBJECTIVE:   CHIEF COMPLAINT / HPI:   Knee Pain   Osteoarthritis  [name] is a 51 y.o. female who presents to the [facility] clinic today to discuss ongoing bilateral knee pain secondary to her osteoarthritis.  In the past she has received joint injections with only temporary mild relief.  She was last seen in our clinic on 1/19 for left knee pain and received joint injection at that time with 40 mg of steroid. On chart review, appears last knee x-ray's performed in September 2021 which showed tricompartment arthritis and left and right knee, most severe at the patellofemoral compartment, with small knee effusion and probable loose body. Patient reports that her knees "crack and crack". She feels that after her right ankle injury 8 years ago that she is "over-compensating" and it is causing her knee to hurt. States that she saw Sports Medicine doctor, "and there was nothing they could do". States that it is not within her budget to go to the Physical Therapy. Has 3-flights of stairs at her home. Feels like "this isn't living".   Hypothyroidism  Last TSH October 2022 was 6.150.  Patient admitted to non-adherence of her synthroid at that time, she noted that she would make more of an effort to take medications regularly and thus no medication changes made at that time. Today, she reports good adherence.  Due for repeat TSH today.  Type 2 DM on insulin: Poorly controlled  Last hemoglobin A1c was 9.7 in October, due for repeat. Overdue for eye exam and foot exam. On high-intensity statin. Current diabetes regimen includes NovoLog 10 units at lunch and 15 units at dinner, insulin Basaglar 50 units twice daily, metformin 1000 mg twice daily.  Has had trouble affording Ozempic in the past.  I previously reached out to our pharmacy team who note she has a $25 co-pay with Ozempic.  There is also a documented telephone note from our pharmacy staff in December who attempted to reach patient to discuss  medication assistance with both Symbicort (has poorly controlled asthma) and Ozempic. She would benefit from SGLT-2 for renal protection though states she has been on Jardiance before but could not tolerate because of the urinary frequency. Denies episodes of hypoglycemia.   Health Maintenance  Due for mammogram, screening colonoscopy, second shingles vaccine.  PERTINENT  PMH / PSH: Anxiety, hypertension, type 2 diabetes, morbid obesity  OBJECTIVE:   BP (!) 115/104    Pulse (!) 115    Wt (!) 410 lb 6.4 oz (186.2 kg)    SpO2 100%    BMI 57.24 kg/m    General: NAD, pleasant, intermittently tearful, able to participate in exam Respiratory: Breathing comfortably on room air Abdomen: Morbidly obese  Extremities: Brace on right ankle. Decreased joint space b/l knees  Skin: warm and dry, no rashes noted Psych: Normal affect and mood, intermittently tearful but consolable  ASSESSMENT/PLAN:   Type II diabetes mellitus with complication (HCC) R4E 11 today, worse than previous of 9.7.  She reports good compliance with her metformin and insulin.  She is frustrated today about the results, and tearful.  Foot exam performed today.  She is prescribed a statin, however reports nonadherence to this.  We discussed the importance of a statin in preventing future cardiovascular complications given she is high risk with her diabetes. Appears she is developing neuropathy as well, had slight sensation loss around great toe in right foot.  -Start Gabapentin 300 mg TID (last GFR 51 in July  2022) -Due for eye examination; referral to ophthalmology placed today  -Encouraged adherence to statin; will check lipid panel today -Repeat A1c in 3 months -Medication changes: Increase Basaglar to 65 units twice daily, increase Novolin to 15 units at lunch, 20 units with dinner. Record sugars.  -Continue Metformin. -Sample for Trulicity given today -Will hold SGLT for now given intolerance in the past and given her  uncontrolled diabetes  Essential hypertension, benign Asymptomatic today. Unfortunately, did not recheck her BP. She is on Lisinopril and Metoprolol, reports compliance. Unclear if she had taken today. -Monitor BP; consider adjustments if remains elevated at future visits.  Hypothyroidism Previously had been nonadherent to her Synthroid, however, reports good compliance lately.  Due for repeat TSH. -Follow-up TSH, adjust dose as needed  Osteoarthritis Severe and impacting her quality of life.  We discussed that unfortunately, her weight is likely not helping the situation (BMI 57).  She has benefited from joint injections in the short-term, however we discussed that this does not advisable long-term. -Referral to orthopedics for further discussion about possible options -Referral to bariatric surgery for further discussion about possible options as her weight is contributing.  Healthcare maintenance Mammogram ordered, patient advised to call to schedule appointment.  Referral to GI for screening colonoscopy placed today.  Shingles vaccine sent to pharmacy.  She is due for Pap smear and was advised to schedule appointment for this.  Asthma States that she feels has been controlled lately.  She has difficulty affording her Symbicort and Breo Ellipta.  She was provided with the [facility] Ellipta sample today.     [name], [place]

## 2021-04-15 ENCOUNTER — Encounter: Payer: Self-pay | Admitting: Family Medicine

## 2021-04-15 ENCOUNTER — Other Ambulatory Visit: Payer: Self-pay

## 2021-04-15 ENCOUNTER — Ambulatory Visit (INDEPENDENT_AMBULATORY_CARE_PROVIDER_SITE_OTHER): Payer: BC Managed Care – PPO | Admitting: Family Medicine

## 2021-04-15 VITALS — BP 115/104 | HR 115 | Wt >= 6400 oz

## 2021-04-15 DIAGNOSIS — E038 Other specified hypothyroidism: Secondary | ICD-10-CM | POA: Diagnosis not present

## 2021-04-15 DIAGNOSIS — Z1231 Encounter for screening mammogram for malignant neoplasm of breast: Secondary | ICD-10-CM

## 2021-04-15 DIAGNOSIS — J45909 Unspecified asthma, uncomplicated: Secondary | ICD-10-CM

## 2021-04-15 DIAGNOSIS — M159 Polyosteoarthritis, unspecified: Secondary | ICD-10-CM

## 2021-04-15 DIAGNOSIS — Z1211 Encounter for screening for malignant neoplasm of colon: Secondary | ICD-10-CM | POA: Diagnosis not present

## 2021-04-15 DIAGNOSIS — E118 Type 2 diabetes mellitus with unspecified complications: Secondary | ICD-10-CM

## 2021-04-15 DIAGNOSIS — E063 Autoimmune thyroiditis: Secondary | ICD-10-CM

## 2021-04-15 DIAGNOSIS — Z0000 Encounter for general adult medical examination without abnormal findings: Secondary | ICD-10-CM | POA: Insufficient documentation

## 2021-04-15 DIAGNOSIS — I10 Essential (primary) hypertension: Secondary | ICD-10-CM

## 2021-04-15 LAB — POCT GLYCOSYLATED HEMOGLOBIN (HGB A1C): HbA1c, POC (controlled diabetic range): 11 % — AB (ref 0.0–7.0)

## 2021-04-15 MED ORDER — GABAPENTIN 100 MG PO CAPS: 300.0000 mg | ORAL_CAPSULE | Freq: Three times a day (TID) | ORAL | 3 refills | Status: DC

## 2021-04-15 MED ORDER — FLUTICASONE FUROATE-VILANTEROL 200-25 MCG/ACT IN AEPB: 1.0000 | INHALATION_SPRAY | Freq: Every day | RESPIRATORY_TRACT | 0 refills | Status: DC

## 2021-04-15 MED ORDER — SHINGRIX 50 MCG/0.5ML IM SUSR: 0.5000 mL | Freq: Once | INTRAMUSCULAR | 0 refills | Status: AC

## 2021-04-15 MED ORDER — TRULICITY 0.75 MG/0.5ML ~~LOC~~ SOAJ: 0.7500 mg | SUBCUTANEOUS | 0 refills | Status: DC

## 2021-04-15 NOTE — Assessment & Plan Note (Signed)
Mammogram ordered, patient advised to call to schedule appointment.  Referral to GI for screening colonoscopy placed today.  Shingles vaccine sent to pharmacy.  She is due for Pap smear and was advised to schedule appointment for this.

## 2021-04-15 NOTE — Assessment & Plan Note (Signed)
Previously had been nonadherent to her Synthroid, however, reports good compliance lately.  Due for repeat TSH. -Follow-up TSH, adjust dose as needed

## 2021-04-15 NOTE — Assessment & Plan Note (Signed)
Asymptomatic today. Unfortunately, did not recheck her BP. She is on Lisinopril and Metoprolol, reports compliance. Unclear if she had taken today. -Monitor BP; consider adjustments if remains elevated at future visits.

## 2021-04-15 NOTE — Assessment & Plan Note (Signed)
A1c 11 today, worse than previous of 9.7.  She reports good compliance with her metformin and insulin.  She is frustrated today about the results, and tearful.  Foot exam performed today.  She is prescribed a statin, however reports nonadherence to this.  We discussed the importance of a statin in preventing future cardiovascular complications given she is high risk with her diabetes. Appears she is developing neuropathy as well, had slight sensation loss around great toe in right foot.  -Start Gabapentin 300 mg TID (last GFR 51 in July 2022) -Due for eye examination; referral to ophthalmology placed today  -Encouraged adherence to statin; will check lipid panel today -Repeat A1c in 3 months -Medication changes: Increase Basaglar to 65 units twice daily, increase Novolin to 15 units at lunch, 20 units with dinner. Record sugars.  -Continue Metformin. -Sample for Trulicity given today -Will hold SGLT for now given intolerance in the past and given her uncontrolled diabetes

## 2021-04-15 NOTE — Assessment & Plan Note (Signed)
States that she feels has been controlled lately.  She has difficulty affording her Symbicort and Breo Ellipta.  She was provided with the [facility] Ellipta sample today.

## 2021-04-15 NOTE — Assessment & Plan Note (Signed)
Severe and impacting her quality of life.  We discussed that unfortunately, her weight is likely not helping the situation (BMI 57).  She has benefited from joint injections in the short-term, however we discussed that this does not advisable long-term. -Referral to orthopedics for further discussion about possible options -Referral to bariatric surgery for further discussion about possible options as her weight is contributing.

## 2021-04-16 ENCOUNTER — Other Ambulatory Visit: Payer: Self-pay | Admitting: Family Medicine

## 2021-04-16 LAB — LIPID PANEL
Chol/HDL Ratio: 4.1 ratio (ref 0.0–4.4)
Cholesterol, Total: 201 mg/dL — ABNORMAL HIGH (ref 100–199)
HDL: 49 mg/dL (ref >39)
LDL Chol Calc (NIH): 112 mg/dL — ABNORMAL HIGH (ref 0–99)
Triglycerides: 231 mg/dL — ABNORMAL HIGH (ref 0–149)
VLDL Cholesterol Cal: 40 mg/dL (ref 5–40)

## 2021-04-16 LAB — TSH: TSH: 4.64 u[IU]/mL — ABNORMAL HIGH (ref 0.450–4.500)

## 2021-04-16 MED ORDER — ROSUVASTATIN CALCIUM 10 MG PO TABS: 10.0000 mg | ORAL_TABLET | Freq: Every day | ORAL | 3 refills | Status: DC

## 2021-04-16 NOTE — Progress Notes (Signed)
ASCVD risk 6.7%; moderate intensity statin recommended due to diabetes and risk < 7.5% in next 10 years. Patient does not have any atorvastatin and has not been taking. Will d/c and start on moderate intensity rosuvastatin given less this is generally better tolerated.

## 2021-04-19 ENCOUNTER — Other Ambulatory Visit: Payer: Self-pay

## 2021-04-19 ENCOUNTER — Ambulatory Visit (INDEPENDENT_AMBULATORY_CARE_PROVIDER_SITE_OTHER): Payer: BC Managed Care – PPO | Admitting: Family Medicine

## 2021-04-19 VITALS — BP 126/82 | HR 103 | Ht 71.0 in | Wt >= 6400 oz

## 2021-04-19 DIAGNOSIS — R399 Unspecified symptoms and signs involving the genitourinary system: Secondary | ICD-10-CM | POA: Diagnosis not present

## 2021-04-19 LAB — POCT URINALYSIS DIP (MANUAL ENTRY)
Bilirubin, UA: NEGATIVE
Glucose, UA: 100 mg/dL — AB
Ketones, POC UA: NEGATIVE mg/dL
Nitrite, UA: NEGATIVE
Protein Ur, POC: NEGATIVE mg/dL
Spec Grav, UA: 1.02 (ref 1.010–1.025)
Urobilinogen, UA: 0.2 E.U./dL
pH, UA: 5.5 (ref 5.0–8.0)

## 2021-04-19 LAB — POCT UA - MICROSCOPIC ONLY

## 2021-04-19 MED ORDER — FLUCONAZOLE 150 MG PO TABS: 150.0000 mg | ORAL_TABLET | Freq: Once | ORAL | 0 refills | Status: AC

## 2021-04-19 MED ORDER — CEPHALEXIN 500 MG PO CAPS: 500.0000 mg | ORAL_CAPSULE | Freq: Two times a day (BID) | ORAL | 0 refills | Status: DC

## 2021-04-19 NOTE — Progress Notes (Signed)
° ° °  SUBJECTIVE:   CHIEF COMPLAINT / HPI:   Concern for UTI: 51 year old female presenting for concern for UTI.  She states that for 2 to 3 days she has had dysuria and urinary frequency.  She states that she thinks she had chills last night or this morning.  She denies fevers.  She also complains of some lower abdominal discomfort and some back pain.  PERTINENT  PMH / PSH: Diabetes  OBJECTIVE:   BP 126/82    Pulse (!) 103    Ht 5\' 11"  (1.803 m)    Wt (!) 411 lb 4 oz (186.5 kg)    LMP 03/29/2021    SpO2 98%    BMI 57.36 kg/m    General: NAD, pleasant, able to participate in exam Respiratory: No respiratory distress on room air Abdomen: She does have suprapubic discomfort on palpation.  No other abdominal discomfort.  She has a mild amount of right-sided CVA discomfort, none on the left Neuro: alert, no obvious focal deficits Psych: Normal affect and mood  ASSESSMENT/PLAN:   Dysuria   urinary frequency  ?Chills: 51 year old female presenting with dysuria and urinary frequency for about 2 to 3 days.  She also had a questionable day of chills either last night or this morning.  No documented fevers.  She does have lower abdominal discomfort in the suprapubic region on physical exam.  She is got a mild amount of CVA tenderness on the right side.  Urinalysis performed today showed trace leukocytes, cloudy urine, negative nitrite.  Given her symptoms we will treat for urinary tract infection.  Discussed return precautions should she develop symptoms of pyelonephritis.  She did have questionable chills earlier and mild amount of possible CVA discomfort on the right side.  Discussed ER precautions.  Follow-up with [name] if her symptoms continue or do not improve.  Per patient request I did order a single dose of Diflucan as she states she often gets a yeast infection after her treatments with antibiotics  Lurline Del, [place]

## 2021-04-19 NOTE — Patient Instructions (Addendum)
We are treating you with an antibiotic for urinary tract infection.  I have sent this to your pharmacy.  If you develop any fevers, worsening pain, or your symptoms do not improve please follow-up with [name].   With regard to your previous orthopedic referral please call the number [phone] to schedule this.  They have attempted to reach you but have been unable to do so.

## 2021-04-22 ENCOUNTER — Other Ambulatory Visit: Payer: Self-pay | Admitting: Family Medicine

## 2021-04-22 ENCOUNTER — Telehealth: Payer: Self-pay

## 2021-04-22 DIAGNOSIS — E118 Type 2 diabetes mellitus with unspecified complications: Secondary | ICD-10-CM

## 2021-04-22 MED ORDER — BASAGLAR KWIKPEN 100 UNIT/ML ~~LOC~~ SOPN: 65.0000 [IU] | PEN_INJECTOR | Freq: Two times a day (BID) | SUBCUTANEOUS | 6 refills | Status: DC

## 2021-04-22 NOTE — Telephone Encounter (Signed)
Patient calls nurse line requesting refill on Basaglar. Patient is requesting that provider update directions in prescription to reflect increased dosage to 65 units BID. Please send to the [facility].   Please advise.   Talbot Grumbling, RN

## 2021-04-23 ENCOUNTER — Encounter (HOSPITAL_COMMUNITY): Payer: Self-pay

## 2021-04-23 ENCOUNTER — Other Ambulatory Visit: Payer: Self-pay

## 2021-04-23 ENCOUNTER — Telehealth: Payer: Self-pay

## 2021-04-23 ENCOUNTER — Ambulatory Visit (HOSPITAL_COMMUNITY)
Admission: EM | Admit: 2021-04-23 | Discharge: 2021-04-23 | Disposition: A | Discharge status: Home / Self Care | Payer: BC Managed Care – PPO | Attending: Internal Medicine | Admitting: Internal Medicine

## 2021-04-23 DIAGNOSIS — J4541 Moderate persistent asthma with (acute) exacerbation: Secondary | ICD-10-CM | POA: Diagnosis not present

## 2021-04-23 MED ORDER — BUDESONIDE-FORMOTEROL FUMARATE 80-4.5 MCG/ACT IN AERO: 2.0000 | INHALATION_SPRAY | Freq: Two times a day (BID) | RESPIRATORY_TRACT | 12 refills | Status: DC

## 2021-04-23 MED ORDER — GUAIFENESIN ER 600 MG PO TB12: 600.0000 mg | ORAL_TABLET | Freq: Two times a day (BID) | ORAL | 0 refills | Status: DC

## 2021-04-23 MED ORDER — AEROCHAMBER PLUS FLO-VU LARGE MISC
Status: AC
  Filled 2021-04-23: qty 1

## 2021-04-23 MED ORDER — BENZONATATE 100 MG PO CAPS: 200.0000 mg | ORAL_CAPSULE | Freq: Three times a day (TID) | ORAL | 0 refills | Status: DC | PRN

## 2021-04-23 MED ORDER — METHYLPREDNISOLONE SODIUM SUCC 125 MG IJ SOLR
INTRAMUSCULAR | Status: AC
  Filled 2021-04-23: qty 2

## 2021-04-23 MED ORDER — ALBUTEROL SULFATE (2.5 MG/3ML) 0.083% IN NEBU
2.5000 mg | INHALATION_SOLUTION | Freq: Once | RESPIRATORY_TRACT | Status: AC
  Administered 2021-04-23: 2.5 mg via RESPIRATORY_TRACT

## 2021-04-23 MED ORDER — PREDNISONE 10 MG PO TABS
40.0000 mg | ORAL_TABLET | Freq: Every day | ORAL | 0 refills | Status: DC
Start: 2021-04-23 — End: 2021-04-28

## 2021-04-23 MED ORDER — ALBUTEROL SULFATE HFA 108 (90 BASE) MCG/ACT IN AERS
INHALATION_SPRAY | RESPIRATORY_TRACT | Status: AC
  Filled 2021-04-23: qty 6.7

## 2021-04-23 MED ORDER — METHYLPREDNISOLONE SODIUM SUCC 125 MG IJ SOLR
60.0000 mg | Freq: Once | INTRAMUSCULAR | Status: AC
  Administered 2021-04-23: 60 mg via INTRAMUSCULAR

## 2021-04-23 MED ORDER — ALBUTEROL SULFATE (2.5 MG/3ML) 0.083% IN NEBU
INHALATION_SOLUTION | RESPIRATORY_TRACT | Status: AC
  Filled 2021-04-23: qty 3

## 2021-04-23 NOTE — ED Triage Notes (Signed)
Pt presents with wheezing and shortness of breath related to her asthma X 2 days with no relief with inhaler & nebulizer.

## 2021-04-23 NOTE — Telephone Encounter (Signed)
Patient's spouse calls nurse line requesting refill on prednisone for "asthma crisis."   Reports that patient has been having a cough, wheezing, difficulty breathing and a hard time catching her breath. States that she is not able to speak in complete sentences without getting winded.   I advised that patient should be evaluated ASAP with these symptoms. Spouse states that they do not want to go to the ED as patient has these episodes every few months and the medication would help.   They are requesting returned call from PCP to further discuss. I reiterated the importance of prompt evaluation in the ED with these respiratory symptoms.   Please advise.   Talbot Grumbling, RN

## 2021-04-23 NOTE — Discharge Instructions (Addendum)
Patient is encouraged to take her medications as prescribed Maintain adequate hydration Avoid exposure to toxic inhalants including smoke exposure Return to urgent care if symptoms worsen.

## 2021-04-24 NOTE — ED Provider Notes (Signed)
[name]    CSN: [id number] Arrival date & time: 04/23/21  1611      History   Chief Complaint Chief Complaint  Patient presents with   Wheezing   Shortness of Breath    HPI [name] is a 52 y.o. female with a history of moderate persistent asthma comes to urgent care with a 2-day history of shortness of breath or wheezing.  Patient endorses being exposed to cigarette smoke at home.  Following that she started having some shortness of breath and wheezing.  She has tried her albuterol inhaler with no relief.  She is now complaining of chest tightness, cough, wheezing and sputum production.  The sputum is clear.  No fever or chills.  No sore throat.  She has bilateral chest pain aggravated by coughing.  No dizziness, near syncope or syncopal episodes.   HPI  Past Medical History:  Diagnosis Date   Acute renal failure ([place]) 05/27/10   hemodialysis for 6 weeks   Anxiety    Asthma    Depression    Diabetes mellitus    Hypertension    Rhabdomyolysis 06/06/10   after drug overdose   Suicide attempt by drug ingestion ([place]) 06/05/10   result rhabdomyolosis and ARF requrining dialysis     Patient Active Problem List   Diagnosis Date Noted   Healthcare maintenance 04/15/2021   Asthma 12/18/2020   Vaginal irritation 09/28/2020   Mild depression 08/30/2020   Arthritis of right ankle 07/25/2020   Right knee pain 06/21/2020   Sinus pain 03/01/2020   Bleeding nose 03/01/2020   Depression 11/21/2019   Moderate episode of recurrent major depressive disorder ([place]) 09/22/2019   Generalized anxiety disorder 09/22/2019   Bipolar I disorder, most recent episode depressed ([place]) 09/22/2019   Bilateral knee pain 03/14/2019   Osteoarthritis 11/24/2018   Bipolar disorder ([place]) 04/29/2017   HLD (hyperlipidemia) 03/19/2017   Asthma exacerbation 03/14/2017   Weight gain 12/06/2016   MDD (major depressive disorder), recurrent severe, 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    Dyspnea 05/09/2015   Essential hypertension    Vitamin D deficiency 04/11/2015   Fatigue 04/10/2015   Non compliance w medication regimen 04/10/2015   Thyroid activity decreased    Adjustment disorder with mixed anxiety and depressed mood    Cough 09/20/2014   Grief at loss of child 06/22/2014   Dysmenorrhea 11/25/2013   Suicidal ideation 05/27/2011   CKD (chronic kidney disease) stage 3, GFR 30-59 ml/min ([place]) 11/06/2010   ENDOMETRIAL HYPERPLASIA UNSPECIFIED 02/07/2008   MENORRHAGIA 01/03/2008   Allergic rhinitis 06/24/2007   Essential hypertension, benign 02/03/2007   Hypothyroidism 05/07/2006   Type II diabetes mellitus with complication ([place]) 74/94/4967   Morbid obesity ([place]) 05/07/2006   Iron deficiency anemia 05/07/2006   Anxiety state 05/07/2006   OBSESSIVE COMPUL. DISORDER 05/07/2006    Past Surgical History:  Procedure Laterality Date   ANKLE ARTHROSCOPY Right 08/17/2015   Procedure: RIGHT ANKLE ARTHROSCOPY WITH SYNOVECTOMY AND LOOSE BODY EXCISION;  Surgeon: Melrose Nakayama, MD;  Location: [place];  Service: Orthopedics;  Laterality: Right;   CHOLECYSTECTOMY     TUBAL LIGATION  1998    OB History   No obstetric history on file.      Home Medications    Prior to Admission medications   Medication Sig Start Date End Date Taking? Authorizing Provider  benzonatate (TESSALON) 100 MG capsule Take 2 capsules (200 mg total) by mouth 3 (three) times daily as needed for cough. 04/23/21  Yes Chanah Tidmore, Myrene Galas, MD  guaiFENesin (MUCINEX) 600 MG 12 hr tablet Take 1 tablet (600 mg total) by mouth 2 (two) times daily. 04/23/21  Yes Claborn Janusz, Myrene Galas, MD  predniSONE (DELTASONE) 10 MG tablet Take 4 tablets (40 mg total) by mouth daily for 5 days. 04/23/21 04/28/21 Yes [place] Roker, Myrene Galas, MD  albuterol (VENTOLIN HFA) 108 (90 Base) MCG/ACT inhaler Inhale 2 puffs into the lungs every 4 (four) hours as needed for wheezing or shortness of breath. 11/23/20   Violet Baldy, MD   budesonide-formoterol [facility]) 80-4.5 MCG/ACT inhaler Inhale 2 puffs into the lungs in the morning and at bedtime. 04/23/21   Sherri Levenhagen, Myrene Galas, MD  cephALEXin (KEFLEX) 500 MG capsule Take 1 capsule (500 mg total) by mouth 2 (two) times daily for 7 days. 04/19/21 04/26/21  Lurline Del, DO  Dulaglutide (TRULICITY) 3.78 HY/8.5OY SOPN Inject 0.75 mg into the skin once a week. 04/15/21   [name], DO  famotidine (PEPCID) 20 MG tablet Take 20 mg by mouth 2 (two) times daily.    [provider]  FLUoxetine (PROZAC) 40 MG capsule Take 2 capsules (80 mg total) by mouth daily. 02/18/21   Salley Slaughter, NP  fluticasone (FLONASE) 50 MCG/ACT nasal spray Place 1 spray into both nostrils daily. 1 spray in each nostril every day 03/01/20   Milus Banister C, DO  fluticasone furoate-vilanterol (BREO ELLIPTA) 200-25 MCG/ACT AEPB Inhale 1 puff into the lungs daily. 04/15/21   [name], DO  gabapentin (NEURONTIN) 100 MG capsule Take 3 capsules (300 mg total) by mouth 3 (three) times daily. 04/15/21   [name], DO  hydrOXYzine (VISTARIL) 50 MG capsule Take 1 capsule (50 mg total) by mouth 3 (three) times daily as needed for itching. 02/18/21   Salley Slaughter, NP  insulin aspart (NOVOLOG) 100 UNIT/ML FlexPen Inject 10 units with Lunch and 15 units with Dinner 12/20/20   [name], DO  Insulin Glargine (BASAGLAR KWIKPEN) 100 UNIT/ML Inject 65 Units into the skin 2 (two) times daily. Take 65U twice a day 04/22/21   [name], DO  ipratropium-albuterol (DUONEB) 0.5-2.5 (3) MG/3ML SOLN Take 3 mLs by nebulization 3 (three) times daily as needed. 10/12/20   Domenic Moras, PA-C  levothyroxine (SYNTHROID) 300 MCG tablet Take 1 tablet by mouth once daily 04/01/21   [name], DO  lisinopril (ZESTRIL) 5 MG tablet Take 1 tablet by mouth once daily 04/01/21   [name], DO  LORazepam (ATIVAN) 1 MG tablet TAKE 1 TABLET BY MOUTH EVERY 8 HOURS AS NEEDED  FOR ANXIETY 04/01/21   [name], DO  metFORMIN (GLUCOPHAGE-XR) 500 MG 24 hr tablet Take 2 tablets (1,000 mg total) by mouth 2 (two) times daily with a meal. 08/09/20   Hensel, Jamal Collin, MD  metoprolol succinate (TOPROL-XL) 25 MG 24 hr tablet Take 1 tablet by mouth once daily 04/01/21   [name], DO  mirtazapine (REMERON) 30 MG tablet Take 1 tablet (30 mg total) by mouth daily. 02/18/21   Salley Slaughter, NP  rosuvastatin (CRESTOR) 10 MG tablet Take 1 tablet (10 mg total) by mouth daily. 04/16/21   [name], DO  Vitamin D, Ergocalciferol, (DRISDOL) 50000 units CAPS capsule Take 1 capsule (50,000 Units total) by mouth every 7 (seven) days. Sunday Patient not taking: Reported on 12/18/2020 12/04/16   Verner Mould, MD    Family History Family History  Problem Relation Age of Onset   Asthma Father     Social History  Social History   Tobacco Use   Smoking status: Never   Smokeless tobacco: Never  Vaping Use   Vaping Use: Never used  Substance Use Topics   Alcohol use: No   Drug use: No     Allergies   Ace inhibitors, Haldol [haloperidol lactate], Nsaids, and Latex   Review of Systems Review of Systems  Constitutional: Negative.   HENT: Negative.    Respiratory:  Positive for chest tightness, shortness of breath and wheezing.   Cardiovascular:  Positive for chest pain.  Neurological: Negative.     Physical Exam Triage Vital Signs ED Triage Vitals  Enc Vitals Group     BP 04/23/21 1700 133/74     Pulse Rate 04/23/21 1629 (!) 107     Resp 04/23/21 1629 (!) 24     Temp --      Temp src --      SpO2 04/23/21 1629 97 %     Weight --      Height --      Head Circumference --      Peak Flow --      Pain Score 04/23/21 1630 0     Pain Loc --      Pain Edu? --      Excl. in [place]? --    No data found.  Updated Vital Signs BP 133/74 (BP Location: Right Arm)    Pulse (!) 106    Resp 20    LMP 03/29/2021    SpO2 96%   Visual  Acuity Right Eye Distance:   Left Eye Distance:   Bilateral Distance:    Right Eye Near:   Left Eye Near:    Bilateral Near:     Physical Exam Vitals and nursing note reviewed.  Constitutional:      General: She is in acute distress.     Appearance: She is well-developed. She is obese.     Comments: Patient unable to make complete sentences.  Cardiovascular:     Rate and Rhythm: Normal rate and regular rhythm.  Pulmonary:     Breath sounds: Examination of the right-middle field reveals decreased breath sounds and wheezing. Examination of the left-middle field reveals decreased breath sounds and wheezing. Examination of the right-lower field reveals decreased breath sounds and wheezing. Examination of the left-lower field reveals decreased breath sounds and wheezing. Decreased breath sounds and wheezing present. No rhonchi or rales.  Abdominal:     General: Bowel sounds are normal.     Palpations: Abdomen is soft.  Neurological:     Mental Status: She is alert.     UC Treatments / Results  Labs (all labs ordered are listed, but only abnormal results are displayed) Labs Reviewed - No data to display  EKG   Radiology No results found.  Procedures Procedures (including critical care time)  Medications Ordered in UC Medications  albuterol (PROVENTIL) (2.5 MG/3ML) 0.083% nebulizer solution 2.5 mg (2.5 mg Nebulization Given 04/23/21 1656)  methylPREDNISolone sodium succinate (SOLU-MEDROL) 125 mg/2 mL injection 60 mg (60 mg Intramuscular Given 04/23/21 1657)    Initial Impression / Assessment and Plan / UC Course  I have reviewed the triage vital signs and the nursing notes.  Pertinent labs & imaging results that were available during my care of the patient were reviewed by me and considered in my medical decision making (see chart for details).     1.  Moderate persistent asthma with acute exacerbation: Albuterol nebulizer treatment was given Solu-Medrol  60 mg x 1  dose Continue albuterol inhaler use Prednisone 40 mg orally daily for 5 days Tessalon Perles as needed for cough Mucinex as needed for thick sputum No indication for chest x-ray Return precautions given 30 minutes after nebulization treatments were completed in the urgent care-patient was able to make complete sentences.  She has improved air entry in the lungs bilaterally.  She endorses feeling better. Final Clinical Impressions(s) / UC Diagnoses   Final diagnoses:  Moderate persistent asthma with (acute) exacerbation     Discharge Instructions      Patient is encouraged to take her medications as prescribed Maintain adequate hydration Avoid exposure to toxic inhalants including smoke exposure Return to urgent care if symptoms worsen.   ED Prescriptions     Medication Sig Dispense Auth. Provider   predniSONE (DELTASONE) 10 MG tablet Take 4 tablets (40 mg total) by mouth daily for 5 days. 20 tablet Daielle Melcher, Myrene Galas, MD   benzonatate (TESSALON) 100 MG capsule Take 2 capsules (200 mg total) by mouth 3 (three) times daily as needed for cough. 30 capsule Kiandra Sanguinetti, Myrene Galas, MD   guaiFENesin (MUCINEX) 600 MG 12 hr tablet Take 1 tablet (600 mg total) by mouth 2 (two) times daily. 30 tablet Kamil Mchaffie, Myrene Galas, MD   budesonide-formoterol [facility]) 80-4.5 MCG/ACT inhaler Inhale 2 puffs into the lungs in the morning and at bedtime. 1 each Doyle Tegethoff, Myrene Galas, MD      PDMP not reviewed this encounter.   Chase Picket, MD 04/24/21 1040

## 2021-04-25 ENCOUNTER — Inpatient Hospital Stay (HOSPITAL_COMMUNITY)
Admission: EM | Admit: 2021-04-25 | Discharge: 2021-04-28 | DRG: 871 | Disposition: A | Discharge status: Home / Self Care | Payer: BC Managed Care – PPO | Attending: Family Medicine | Admitting: Family Medicine

## 2021-04-25 ENCOUNTER — Emergency Department (HOSPITAL_COMMUNITY): Payer: BC Managed Care – PPO

## 2021-04-25 ENCOUNTER — Encounter (HOSPITAL_COMMUNITY): Payer: Self-pay

## 2021-04-25 DIAGNOSIS — A419 Sepsis, unspecified organism: Secondary | ICD-10-CM | POA: Diagnosis present

## 2021-04-25 DIAGNOSIS — Z7951 Long term (current) use of inhaled steroids: Secondary | ICD-10-CM | POA: Diagnosis not present

## 2021-04-25 DIAGNOSIS — Z79899 Other long term (current) drug therapy: Secondary | ICD-10-CM | POA: Diagnosis not present

## 2021-04-25 DIAGNOSIS — F411 Generalized anxiety disorder: Secondary | ICD-10-CM | POA: Diagnosis present

## 2021-04-25 DIAGNOSIS — Z888 Allergy status to other drugs, medicaments and biological substances status: Secondary | ICD-10-CM

## 2021-04-25 DIAGNOSIS — R45851 Suicidal ideations: Secondary | ICD-10-CM | POA: Diagnosis not present

## 2021-04-25 DIAGNOSIS — Z9851 Tubal ligation status: Secondary | ICD-10-CM

## 2021-04-25 DIAGNOSIS — E1122 Type 2 diabetes mellitus with diabetic chronic kidney disease: Secondary | ICD-10-CM | POA: Diagnosis present

## 2021-04-25 DIAGNOSIS — K5900 Constipation, unspecified: Secondary | ICD-10-CM | POA: Diagnosis not present

## 2021-04-25 DIAGNOSIS — Z9151 Personal history of suicidal behavior: Secondary | ICD-10-CM

## 2021-04-25 DIAGNOSIS — F319 Bipolar disorder, unspecified: Secondary | ICD-10-CM | POA: Diagnosis present

## 2021-04-25 DIAGNOSIS — E872 Acidosis, unspecified: Secondary | ICD-10-CM | POA: Diagnosis present

## 2021-04-25 DIAGNOSIS — E559 Vitamin D deficiency, unspecified: Secondary | ICD-10-CM | POA: Diagnosis present

## 2021-04-25 DIAGNOSIS — J4551 Severe persistent asthma with (acute) exacerbation: Secondary | ICD-10-CM | POA: Diagnosis present

## 2021-04-25 DIAGNOSIS — Z7984 Long term (current) use of oral hypoglycemic drugs: Secondary | ICD-10-CM

## 2021-04-25 DIAGNOSIS — J189 Pneumonia, unspecified organism: Secondary | ICD-10-CM | POA: Diagnosis present

## 2021-04-25 DIAGNOSIS — Z7985 Long-term (current) use of injectable non-insulin antidiabetic drugs: Secondary | ICD-10-CM

## 2021-04-25 DIAGNOSIS — I129 Hypertensive chronic kidney disease with stage 1 through stage 4 chronic kidney disease, or unspecified chronic kidney disease: Secondary | ICD-10-CM | POA: Diagnosis present

## 2021-04-25 DIAGNOSIS — M1990 Unspecified osteoarthritis, unspecified site: Secondary | ICD-10-CM | POA: Diagnosis present

## 2021-04-25 DIAGNOSIS — E1165 Type 2 diabetes mellitus with hyperglycemia: Secondary | ICD-10-CM | POA: Diagnosis present

## 2021-04-25 DIAGNOSIS — R6520 Severe sepsis without septic shock: Secondary | ICD-10-CM

## 2021-04-25 DIAGNOSIS — Z794 Long term (current) use of insulin: Secondary | ICD-10-CM | POA: Diagnosis not present

## 2021-04-25 DIAGNOSIS — Z91040 Latex allergy status: Secondary | ICD-10-CM

## 2021-04-25 DIAGNOSIS — Z79890 Hormone replacement therapy (postmenopausal): Secondary | ICD-10-CM | POA: Diagnosis not present

## 2021-04-25 DIAGNOSIS — E785 Hyperlipidemia, unspecified: Secondary | ICD-10-CM | POA: Diagnosis present

## 2021-04-25 DIAGNOSIS — E039 Hypothyroidism, unspecified: Secondary | ICD-10-CM | POA: Diagnosis present

## 2021-04-25 DIAGNOSIS — F419 Anxiety disorder, unspecified: Secondary | ICD-10-CM

## 2021-04-25 DIAGNOSIS — N183 Chronic kidney disease, stage 3 unspecified: Secondary | ICD-10-CM | POA: Diagnosis present

## 2021-04-25 DIAGNOSIS — Z20822 Contact with and (suspected) exposure to covid-19: Secondary | ICD-10-CM | POA: Diagnosis present

## 2021-04-25 DIAGNOSIS — J188 Other pneumonia, unspecified organism: Secondary | ICD-10-CM

## 2021-04-25 DIAGNOSIS — Z825 Family history of asthma and other chronic lower respiratory diseases: Secondary | ICD-10-CM

## 2021-04-25 LAB — I-STAT VENOUS BLOOD GAS, ED
Acid-base deficit: 1 mmol/L (ref 0.0–2.0)
Bicarbonate: 25.7 mmol/L (ref 20.0–28.0)
Calcium, Ion: 1.04 mmol/L — ABNORMAL LOW (ref 1.15–1.40)
HCT: 34 % — ABNORMAL LOW (ref 36.0–46.0)
Hemoglobin: 11.6 g/dL — ABNORMAL LOW (ref 12.0–15.0)
O2 Saturation: 94 %
Potassium: 4.9 mmol/L (ref 3.5–5.1)
Sodium: 140 mmol/L (ref 135–145)
TCO2: 27 mmol/L (ref 22–32)
pCO2, Ven: 47.9 mmHg (ref 44–60)
pH, Ven: 7.338 (ref 7.25–7.43)
pO2, Ven: 77 mmHg — ABNORMAL HIGH (ref 32–45)

## 2021-04-25 LAB — CBC WITH DIFFERENTIAL/PLATELET
Abs Immature Granulocytes: 0.04 10*3/uL (ref 0.00–0.07)
Basophils Absolute: 0.1 10*3/uL (ref 0.0–0.1)
Basophils Relative: 1 %
Eosinophils Absolute: 0 10*3/uL (ref 0.0–0.5)
Eosinophils Relative: 0 %
HCT: 33 % — ABNORMAL LOW (ref 36.0–46.0)
Hemoglobin: 9.7 g/dL — ABNORMAL LOW (ref 12.0–15.0)
Immature Granulocytes: 0 %
Lymphocytes Relative: 30 %
Lymphs Abs: 3.2 10*3/uL (ref 0.7–4.0)
MCH: 24.7 pg — ABNORMAL LOW (ref 26.0–34.0)
MCHC: 29.4 g/dL — ABNORMAL LOW (ref 30.0–36.0)
MCV: 84 fL (ref 80.0–100.0)
Monocytes Absolute: 0.9 10*3/uL (ref 0.1–1.0)
Monocytes Relative: 9 %
Neutro Abs: 6.7 10*3/uL (ref 1.7–7.7)
Neutrophils Relative %: 60 %
Platelets: 522 10*3/uL — ABNORMAL HIGH (ref 150–400)
RBC: 3.93 MIL/uL (ref 3.87–5.11)
RDW: 14.8 % (ref 11.5–15.5)
WBC: 10.9 10*3/uL — ABNORMAL HIGH (ref 4.0–10.5)
nRBC: 0 % (ref 0.0–0.2)

## 2021-04-25 LAB — APTT: aPTT: 29 seconds (ref 24–36)

## 2021-04-25 LAB — BASIC METABOLIC PANEL
Anion gap: 11 (ref 5–15)
BUN: 24 mg/dL — ABNORMAL HIGH (ref 6–20)
CO2: 21 mmol/L — ABNORMAL LOW (ref 22–32)
Calcium: 8.2 mg/dL — ABNORMAL LOW (ref 8.9–10.3)
Chloride: 107 mmol/L (ref 98–111)
Creatinine, Ser: 1.66 mg/dL — ABNORMAL HIGH (ref 0.44–1.00)
GFR, Estimated: 37 mL/min — ABNORMAL LOW (ref >60)
Glucose, Bld: 319 mg/dL — ABNORMAL HIGH (ref 70–99)
Potassium: 4.4 mmol/L (ref 3.5–5.1)
Sodium: 139 mmol/L (ref 135–145)

## 2021-04-25 LAB — PROTIME-INR
INR: 1.1 (ref 0.8–1.2)
Prothrombin Time: 13.9 seconds (ref 11.4–15.2)

## 2021-04-25 LAB — LACTIC ACID, PLASMA
Lactic Acid, Venous: 2.3 mmol/L (ref 0.5–1.9)
Lactic Acid, Venous: 2.6 mmol/L (ref 0.5–1.9)
Lactic Acid, Venous: 3.1 mmol/L (ref 0.5–1.9)

## 2021-04-25 LAB — RESP PANEL BY RT-PCR (FLU A&B, COVID) ARPGX2
Influenza A by PCR: NEGATIVE
Influenza B by PCR: NEGATIVE
SARS Coronavirus 2 by RT PCR: NEGATIVE

## 2021-04-25 LAB — GLUCOSE, CAPILLARY
Glucose-Capillary: 579 mg/dL (ref 70–99)
Glucose-Capillary: 551 mg/dL (ref 70–99)

## 2021-04-25 MED ORDER — HYDROXYZINE HCL 25 MG PO TABS
50.0000 mg | ORAL_TABLET | Freq: Three times a day (TID) | ORAL | Status: DC | PRN
  Administered 2021-04-26 (×2): 50 mg via ORAL
  Filled 2021-04-25 (×2): qty 2

## 2021-04-25 MED ORDER — HYDROXYZINE PAMOATE 50 MG PO CAPS: 50.0000 mg | ORAL_CAPSULE | Freq: Three times a day (TID) | ORAL | Status: DC | PRN

## 2021-04-25 MED ORDER — IPRATROPIUM-ALBUTEROL 0.5-2.5 (3) MG/3ML IN SOLN
3.0000 mL | RESPIRATORY_TRACT | Status: DC
  Administered 2021-04-25 – 2021-04-26 (×5): 3 mL via RESPIRATORY_TRACT
  Filled 2021-04-25 (×5): qty 3

## 2021-04-25 MED ORDER — GABAPENTIN 300 MG PO CAPS
300.0000 mg | ORAL_CAPSULE | Freq: Three times a day (TID) | ORAL | Status: DC
  Administered 2021-04-25 – 2021-04-28 (×9): 300 mg via ORAL
  Filled 2021-04-25 (×9): qty 1

## 2021-04-25 MED ORDER — SODIUM CHLORIDE 0.9 % IV SOLN
500.0000 mg | INTRAVENOUS | Status: DC
  Administered 2021-04-25: 500 mg via INTRAVENOUS
  Filled 2021-04-25: qty 5

## 2021-04-25 MED ORDER — SODIUM CHLORIDE 0.9 % IV SOLN
2.0000 g | INTRAVENOUS | Status: DC
  Administered 2021-04-25: 2 g via INTRAVENOUS
  Filled 2021-04-25: qty 20

## 2021-04-25 MED ORDER — FLUTICASONE FUROATE-VILANTEROL 100-25 MCG/ACT IN AEPB
1.0000 | INHALATION_SPRAY | Freq: Every day | RESPIRATORY_TRACT | Status: DC
  Administered 2021-04-25 – 2021-04-28 (×3): 1 via RESPIRATORY_TRACT
  Filled 2021-04-25 (×2): qty 28

## 2021-04-25 MED ORDER — LACTATED RINGERS IV SOLN: INTRAVENOUS | Status: DC

## 2021-04-25 MED ORDER — FLUOXETINE HCL 20 MG PO CAPS
80.0000 mg | ORAL_CAPSULE | Freq: Every day | ORAL | Status: DC
  Administered 2021-04-26 – 2021-04-28 (×3): 80 mg via ORAL
  Filled 2021-04-25 (×3): qty 4

## 2021-04-25 MED ORDER — INSULIN ASPART 100 UNIT/ML IJ SOLN
0.0000 [IU] | Freq: Three times a day (TID) | INTRAMUSCULAR | Status: DC
  Administered 2021-04-26: 15 [IU] via SUBCUTANEOUS

## 2021-04-25 MED ORDER — ENOXAPARIN SODIUM 40 MG/0.4ML IJ SOSY
40.0000 mg | PREFILLED_SYRINGE | INTRAMUSCULAR | Status: DC
  Administered 2021-04-25 – 2021-04-27 (×3): 40 mg via SUBCUTANEOUS
  Filled 2021-04-25 (×3): qty 0.4

## 2021-04-25 MED ORDER — ROSUVASTATIN CALCIUM 5 MG PO TABS
10.0000 mg | ORAL_TABLET | Freq: Every day | ORAL | Status: DC
  Administered 2021-04-25 – 2021-04-28 (×4): 10 mg via ORAL
  Filled 2021-04-25 (×4): qty 2

## 2021-04-25 MED ORDER — IPRATROPIUM BROMIDE 0.02 % IN SOLN
0.5000 mg | Freq: Once | RESPIRATORY_TRACT | Status: AC
  Administered 2021-04-25: 0.5 mg via RESPIRATORY_TRACT
  Filled 2021-04-25: qty 2.5

## 2021-04-25 MED ORDER — METOPROLOL SUCCINATE ER 25 MG PO TB24
25.0000 mg | ORAL_TABLET | Freq: Every day | ORAL | Status: DC
  Administered 2021-04-25 – 2021-04-28 (×4): 25 mg via ORAL
  Filled 2021-04-25 (×4): qty 1

## 2021-04-25 MED ORDER — LISINOPRIL 10 MG PO TABS
5.0000 mg | ORAL_TABLET | Freq: Every day | ORAL | Status: DC
  Administered 2021-04-25 – 2021-04-28 (×4): 5 mg via ORAL
  Filled 2021-04-25 (×4): qty 1

## 2021-04-25 MED ORDER — SODIUM CHLORIDE 0.9 % IV SOLN
2.0000 g | INTRAVENOUS | Status: DC
  Administered 2021-04-26: 2 g via INTRAVENOUS
  Filled 2021-04-25 (×2): qty 20

## 2021-04-25 MED ORDER — INSULIN GLARGINE-YFGN 100 UNIT/ML ~~LOC~~ SOLN
50.0000 [IU] | Freq: Two times a day (BID) | SUBCUTANEOUS | Status: DC
  Administered 2021-04-25 – 2021-04-26 (×2): 50 [IU] via SUBCUTANEOUS
  Filled 2021-04-25 (×4): qty 0.5

## 2021-04-25 MED ORDER — ALBUTEROL SULFATE (2.5 MG/3ML) 0.083% IN NEBU
2.5000 mg | INHALATION_SOLUTION | RESPIRATORY_TRACT | Status: DC | PRN
  Administered 2021-04-26 – 2021-04-27 (×3): 2.5 mg via RESPIRATORY_TRACT
  Filled 2021-04-25 (×2): qty 3

## 2021-04-25 MED ORDER — MAGNESIUM SULFATE 2 GM/50ML IV SOLN
2.0000 g | Freq: Once | INTRAVENOUS | Status: AC
  Administered 2021-04-25: 2 g via INTRAVENOUS
  Filled 2021-04-25: qty 50

## 2021-04-25 MED ORDER — HYDROXYZINE PAMOATE 50 MG PO CAPS
50.0000 mg | ORAL_CAPSULE | Freq: Three times a day (TID) | ORAL | Status: DC | PRN
  Filled 2021-04-25: qty 1

## 2021-04-25 MED ORDER — LEVOTHYROXINE SODIUM 100 MCG PO TABS
300.0000 ug | ORAL_TABLET | Freq: Every day | ORAL | Status: DC
  Administered 2021-04-25 – 2021-04-28 (×4): 300 ug via ORAL
  Filled 2021-04-25 (×4): qty 3

## 2021-04-25 MED ORDER — ALBUTEROL SULFATE (2.5 MG/3ML) 0.083% IN NEBU
10.0000 mg/h | INHALATION_SOLUTION | Freq: Once | RESPIRATORY_TRACT | Status: AC
  Administered 2021-04-25: 10 mg/h via RESPIRATORY_TRACT
  Filled 2021-04-25: qty 3

## 2021-04-25 MED ORDER — MIDAZOLAM HCL 2 MG/2ML IJ SOLN
2.0000 mg | Freq: Once | INTRAMUSCULAR | Status: AC
  Administered 2021-04-25: 2 mg via INTRAVENOUS
  Filled 2021-04-25: qty 2

## 2021-04-25 MED ORDER — MIRTAZAPINE 15 MG PO TABS
30.0000 mg | ORAL_TABLET | Freq: Every day | ORAL | Status: DC
  Administered 2021-04-25 – 2021-04-27 (×3): 30 mg via ORAL
  Filled 2021-04-25: qty 2
  Filled 2021-04-25 (×2): qty 1
  Filled 2021-04-25: qty 2

## 2021-04-25 MED ORDER — METHYLPREDNISOLONE SODIUM SUCC 125 MG IJ SOLR
125.0000 mg | Freq: Once | INTRAMUSCULAR | Status: AC
  Administered 2021-04-25: 125 mg via INTRAVENOUS
  Filled 2021-04-25: qty 2

## 2021-04-25 MED ORDER — LACTATED RINGERS IV SOLN: INTRAVENOUS | Status: AC

## 2021-04-25 MED ORDER — RACEPINEPHRINE HCL 2.25 % IN NEBU
0.5000 mL | INHALATION_SOLUTION | Freq: Once | RESPIRATORY_TRACT | Status: AC
  Administered 2021-04-25: 0.5 mL via RESPIRATORY_TRACT
  Filled 2021-04-25: qty 0.5

## 2021-04-25 MED ORDER — SODIUM CHLORIDE 0.9 % IV SOLN
500.0000 mg | INTRAVENOUS | Status: DC
  Administered 2021-04-26: 500 mg via INTRAVENOUS
  Filled 2021-04-25 (×2): qty 5

## 2021-04-25 MED ORDER — LORAZEPAM 1 MG PO TABS
1.0000 mg | ORAL_TABLET | Freq: Three times a day (TID) | ORAL | Status: DC | PRN
  Administered 2021-04-25 – 2021-04-27 (×5): 1 mg via ORAL
  Filled 2021-04-25 (×5): qty 1

## 2021-04-25 MED ORDER — INSULIN ASPART 100 UNIT/ML IJ SOLN
10.0000 [IU] | Freq: Once | INTRAMUSCULAR | Status: AC
  Administered 2021-04-25: 10 [IU] via SUBCUTANEOUS

## 2021-04-25 MED ORDER — METHYLPREDNISOLONE SODIUM SUCC 125 MG IJ SOLR
125.0000 mg | Freq: Every day | INTRAMUSCULAR | Status: DC
  Administered 2021-04-26: 125 mg via INTRAVENOUS
  Filled 2021-04-25: qty 2

## 2021-04-25 MED ORDER — IOHEXOL 350 MG/ML SOLN
100.0000 mL | Freq: Once | INTRAVENOUS | Status: AC | PRN
  Administered 2021-04-25: 82 mL via INTRAVENOUS

## 2021-04-25 MED ORDER — INSULIN ASPART 100 UNIT/ML IJ SOLN
0.0000 [IU] | Freq: Every day | INTRAMUSCULAR | Status: DC
  Administered 2021-04-25: 5 [IU] via SUBCUTANEOUS

## 2021-04-25 MED ORDER — ACETAMINOPHEN 325 MG PO TABS
650.0000 mg | ORAL_TABLET | Freq: Once | ORAL | Status: AC
  Administered 2021-04-25: 650 mg via ORAL
  Filled 2021-04-25: qty 2

## 2021-04-25 NOTE — H&P (Signed)
[facility] Admission History and Physical Service Pager: [phone]  Patient name: [name] Medical record number: [id number] Date of birth: 06/28/1970 Age: 51 y.o. Gender: female  Primary Care Provider: [name], DO Consultants: none Code Status: FULL Preferred Emergency Contact:  Contact Information     Name Relation Home Work Mobile   [place] Spouse [phone]          Chief Complaint: dyspnea  Assessment and Plan: [name] is a 51 y.o. female presenting with respiratory distress secondary to asthma exacerbation in the setting of multifocal pneumonia. PMH is significant for asthma, iron deficiency anemia, hypothyroidism, depression, anxiety, CKD, morbid obesity.  Asthma exacerbation Patient has history of asthma uses albuterol nebulizer and MDI inhaler at home when needed.  She has been hospitalized for asthma exacerbation in the past but has never required intubation.  Patient had been using her home albuterol for the past 4 days with no relief of her shortness of breath and cough.  In ED today, patient had increased work of breathing, wheezing, was tachycardic and tachypneic.  Patient received DuoNebs, Solu-Medrol 125 mg injection, racemic epi, and 2 doses of mag sulfate 2 g IV with some improvement.  On exam patient had oxygen saturations at 97% on 2 L via nasal cannula but was sitting in a tripod like position and unable to speak in complete sentences stating she was still very short of breath and felt like she could not get air in.  On auscultation there were wheezes in all lung fields with good air movement.  Patient does have history of anxiety and was given 1 dose of Versed 2 mg in the ED. There may be some component of anxiety worsening her symptoms of shortness of breath as air movement is heard in all lung fields and oxygen saturation remains in the high 90s on 2L [place].  VBG is nonconcerning with normal PCO2.  PO2 is  elevated 77 likely due to supplemental oxygen. -Patient admitted to progressive with family practice teaching service, attending Dr. Erin Hearing -Albuterol 2.5 mg nebulizer every 2 hours as needed -DuoNeb every 4 hours  -Breo Ellipta 1 puff daily -Solu-Medrol 125 mg tomorrow morning -Keep oxygen saturation > 92% -continuous pulse ox -Supplemental oxygen via nasal cannula prn -RT consult  Multifocal pneumonia In the ED patient was tachypneic and tachycardic CT was ordered which ruled out PE as a cause of her tachypnea and tachycardia.  CT did show evidence of multifocal pneumonia. IV Ceftriaxone and Azithromycin were started in ED.  Multifocal pneumonia is consistent with her symptoms of shortness of breath, cough with blood-tinged sputum and chills for the past 4 days.  Can also consider a viral pneumonia as a cause of her symptoms.  Will test for MRSA with nasal PCR and will test for Legionella and strep pneumoniae with urine test.  PE, lung masses, pulmonary effusion as cause of symptoms have been ruled out with imaging.  CBC showed WBC of 10.9 and patient had a lactic acid of 2.3 with a repeat of 2.6.  Blood cultures were drawn and are pending. -Azithromycin 500 mg IV daily for total of 5 days  -Ceftriaxone 2 g IV daily for total of 5 days -supplemental O2 prn -keep O2 sat > 92% -Continuous pulse ox -repeat lactic acid -f/u blood cultures -MRSA PCR -Urine test for Legionella and strep pneumoniae  Type 2 diabetes Last hemoglobin A1c from 04/15/2021 was 11.  Home medications include Trulicity 4.403 mg weekly, NovoLog  15 units with lunch and 20 units with dinner, Basaglar 65 units twice daily and metformin 1000 mg twice daily.  Patient also takes gabapentin 300 mg 3 times daily -CBGs 3 times daily with meals and at bedtime with SSI -Semglee 50 units twice daily  Anxiety/depression Home medications include Prozac 80 mg daily, Atarax 50 mg 3 times daily as needed, Ativan 1 mg every 8 hours as  needed, and Remeron 30 mg daily -Continue home medications  Hypothyroidism TSH on 04/15/2021 of 4.64. Home medication includes levothyroxine 300 mcg daily -Continue home medication  Hypertension Blood pressures have been normotensive to elevated up to 150s/low 100s since arrival. Home medication includes lisinopril 5 mg daily and metoprolol 25 mg daily -Continue home medications  Hyperlipidemia Lipid panel from 04/15/2021 shows total cholesterol 201, triglycerides of 231 and LDL elevated to 112 Home medication includes Crestor 10 mg daily -Continue home medication   FEN/GI: Carb modified Prophylaxis: Lovenox  Disposition: Progressive  History of Present Illness:  [name] is a 51 y.o. female presenting with dyspnea.  Patient reports symptoms started 4 days ago on Sunday. She has had intermittent chills, productive cough (had hemoptysis with bright red blood starting last night - about 1 tbsp of blood total). She used her albuterol inhaler and nebulizer and Sinusex with no improvement. Denies fever, admits to chills. No sick contacts. Negative Covid test at home. She went to urgent care 2 days ago on Tuesday. She was treated with Solu-Medrol, prescribed prednisone 40 mg for 5 days, Tessalon Perles as needed and given a sample of Breo Ellipta and Symbicort prescription written.  Patient states this morning when she woke up she felt very short of breath like she cannot get air in and came to the emergency department.     Review Of Systems: Per HPI with the following additions:   Review of Systems  HENT:  Negative for congestion and rhinorrhea.   Gastrointestinal:  Negative for abdominal pain, diarrhea, nausea and vomiting.    Patient Active Problem List   Diagnosis Date Noted   Healthcare maintenance 04/15/2021   Asthma 12/18/2020   Vaginal irritation 09/28/2020   Mild depression 08/30/2020   Arthritis of right ankle 07/25/2020   Right knee pain 06/21/2020   Sinus pain  03/01/2020   Bleeding nose 03/01/2020   Depression 11/21/2019   Moderate episode of recurrent major depressive disorder ([place]) 09/22/2019   Generalized anxiety disorder 09/22/2019   Bipolar I disorder, most recent episode depressed ([place]) 09/22/2019   Bilateral knee pain 03/14/2019   Osteoarthritis 11/24/2018   Bipolar disorder ([place]) 04/29/2017   HLD (hyperlipidemia) 03/19/2017   Asthma exacerbation 03/14/2017   Weight gain 12/06/2016   MDD (major depressive disorder), recurrent severe, without psychosis ([place]) 05/11/2015   Dyspnea 05/09/2015   Essential hypertension    Vitamin D deficiency 04/11/2015   Fatigue 04/10/2015   Non compliance w medication regimen 04/10/2015   Thyroid activity decreased    Adjustment disorder with mixed anxiety and depressed mood    Cough 09/20/2014   Grief at loss of child 06/22/2014   Dysmenorrhea 11/25/2013   Suicidal ideation 05/27/2011   CKD (chronic kidney disease) stage 3, GFR 30-59 ml/min ([place]) 11/06/2010   ENDOMETRIAL HYPERPLASIA UNSPECIFIED 02/07/2008   MENORRHAGIA 01/03/2008   Allergic rhinitis 06/24/2007   Essential hypertension, benign 02/03/2007   Hypothyroidism 05/07/2006   Type II diabetes mellitus with complication ([place]) 76/22/6333   Morbid obesity ([place]) 05/07/2006   Iron deficiency anemia 05/07/2006   Anxiety  state 05/07/2006   OBSESSIVE COMPUL. DISORDER 05/07/2006    Past Medical History: Past Medical History:  Diagnosis Date   Acute renal failure ([place]) 05/27/10   hemodialysis for 6 weeks   Anxiety    Asthma    Depression    Diabetes mellitus    Hypertension    Rhabdomyolysis 06/06/10   after drug overdose   Suicide attempt by drug ingestion ([place]) 06/05/10   result rhabdomyolosis and ARF requrining dialysis     Past Surgical History: Past Surgical History:  Procedure Laterality Date   ANKLE ARTHROSCOPY Right 08/17/2015   Procedure: RIGHT ANKLE ARTHROSCOPY WITH SYNOVECTOMY AND LOOSE BODY EXCISION;  Surgeon: Melrose Nakayama, MD;  Location: [facility];  Service: Orthopedics;  Laterality: Right;   CHOLECYSTECTOMY     TUBAL LIGATION  1998    Social History: Social History   Tobacco Use   Smoking status: Never   Smokeless tobacco: Never  Vaping Use   Vaping Use: Never used  Substance Use Topics   Alcohol use: No   Drug use: No   Additional social history: denies smoking, alcohol, and recreational drug use.  Please also refer to relevant sections of EMR.  Family History: Family History  Problem Relation Age of Onset   Asthma Father      Allergies and Medications: Allergies  Allergen Reactions   Ace Inhibitors Other (See Comments)    Patient had acute renal failure requiring hemodialysis after a suicide attempt.  Nephro recommended avoiding use.   Haldol [Haloperidol Lactate] Other (See Comments)    Tardive dyskinesia   Nsaids Other (See Comments)    Nephro recommended avoiding after acute renal failure requiring hemodialysis associated with a suicide attempt.   Latex Other (See Comments)    Rash around IV site   No current facility-administered medications on file prior to encounter.   Current Outpatient Medications on File Prior to Encounter  Medication Sig Dispense Refill   albuterol (VENTOLIN HFA) 108 (90 Base) MCG/ACT inhaler Inhale 2 puffs into the lungs every 4 (four) hours as needed for wheezing or shortness of breath. 18 g 0   benzonatate (TESSALON) 100 MG capsule Take 2 capsules (200 mg total) by mouth 3 (three) times daily as needed for cough. 30 capsule 0   budesonide-formoterol (SYMBICORT) 80-4.5 MCG/ACT inhaler Inhale 2 puffs into the lungs in the morning and at bedtime. 1 each 12   cephALEXin (KEFLEX) 500 MG capsule Take 1 capsule (500 mg total) by mouth 2 (two) times daily for 7 days. 14 capsule 0   Dulaglutide (TRULICITY) 8.65 HQ/4.6NG SOPN Inject 0.75 mg into the skin once a week. 0.5 mL 0   famotidine (PEPCID) 20 MG tablet Take 20 mg by mouth 2 (two) times daily.      FLUoxetine (PROZAC) 40 MG capsule Take 2 capsules (80 mg total) by mouth daily. 60 capsule 3   fluticasone (FLONASE) 50 MCG/ACT nasal spray Place 1 spray into both nostrils daily. 1 spray in each nostril every day 16 g 1   fluticasone furoate-vilanterol (BREO ELLIPTA) 200-25 MCG/ACT AEPB Inhale 1 puff into the lungs daily. 1 each 0   gabapentin (NEURONTIN) 100 MG capsule Take 3 capsules (300 mg total) by mouth 3 (three) times daily. 90 capsule 3   guaiFENesin (MUCINEX) 600 MG 12 hr tablet Take 1 tablet (600 mg total) by mouth 2 (two) times daily. 30 tablet 0   hydrOXYzine (VISTARIL) 50 MG capsule Take 1 capsule (50 mg total) by mouth 3 (three)  times daily as needed for itching. 90 capsule 3   insulin aspart (NOVOLOG) 100 UNIT/ML FlexPen Inject 10 units with Lunch and 15 units with Dinner 15 mL 11   Insulin Glargine (BASAGLAR KWIKPEN) 100 UNIT/ML Inject 65 Units into the skin 2 (two) times daily. Take 65U twice a day 15 mL 6   ipratropium-albuterol (DUONEB) 0.5-2.5 (3) MG/3ML SOLN Take 3 mLs by nebulization 3 (three) times daily as needed. 360 mL 0   levothyroxine (SYNTHROID) 300 MCG tablet Take 1 tablet by mouth once daily 90 tablet 0   lisinopril (ZESTRIL) 5 MG tablet Take 1 tablet by mouth once daily 90 tablet 3   LORazepam (ATIVAN) 1 MG tablet TAKE 1 TABLET BY MOUTH EVERY 8 HOURS AS NEEDED FOR ANXIETY 45 tablet 0   metFORMIN (GLUCOPHAGE-XR) 500 MG 24 hr tablet Take 2 tablets (1,000 mg total) by mouth 2 (two) times daily with a meal. 120 tablet 11   metoprolol succinate (TOPROL-XL) 25 MG 24 hr tablet Take 1 tablet by mouth once daily 90 tablet 3   mirtazapine (REMERON) 30 MG tablet Take 1 tablet (30 mg total) by mouth daily. 30 tablet 3   predniSONE (DELTASONE) 10 MG tablet Take 4 tablets (40 mg total) by mouth daily for 5 days. 20 tablet 0   rosuvastatin (CRESTOR) 10 MG tablet Take 1 tablet (10 mg total) by mouth daily. 90 tablet 3   Vitamin D, Ergocalciferol, (DRISDOL) 50000 units CAPS capsule  Take 1 capsule (50,000 Units total) by mouth every 7 (seven) days. Sunday (Patient not taking: Reported on 12/18/2020) 12 capsule 0    Objective: BP 124/74 (BP Location: Right Arm)    Pulse (!) 102    Temp 98.2 F (36.8 C) (Oral)    Resp 16    LMP 03/29/2021    SpO2 94%  Exam: General: Patient sitting in tripod position on edge of bed, in mild distress Eyes: White sclera, clear conjunctiva ENTM: MMM, no erythema or exudate of nasopharynx Cardiovascular: RRR, normal S1/S2 Respiratory: Inspiratory wheezing in all lung fields, good air movement, unable to speak in complete sentences, sitting in tripod position MSK: Good bulk and tone Derm: No rashes noted, skin dry and warm Neuro: No focal deficits Psych: Patient appears to be very anxious, asks if we think she is going to die, continues to say that she feels she cannot get air in although air movement is good and oxygen saturation is in the high 90s  Labs and Imaging: CBC BMET  Recent Labs  Lab 04/25/21 0845 04/25/21 1037  WBC 10.9*  --   HGB 9.7* 11.6*  HCT 33.0* 34.0*  PLT 522*  --    Recent Labs  Lab 04/25/21 0845 04/25/21 1037  NA 139 140  K 4.4 4.9  CL 107  --   CO2 21*  --   BUN 24*  --   CREATININE 1.66*  --   GLUCOSE 319*  --   CALCIUM 8.2*  --      EKG: Sinus tachycardia with QTc normal at 467    Precious Gilding, DO 04/25/2021, 2:22 PM PGY-1, [facility] pager: [phone], text pages welcome

## 2021-04-25 NOTE — Sepsis Progress Note (Signed)
Sent chat to MD to request a 3rd lactic acid. Thank you

## 2021-04-25 NOTE — Progress Notes (Signed)
FPTS Brief Progress Note  S:Patient reports that she is still having some shortness of breath but feels about 50% better than she did this morning where she was afraid she might die and that she is feeling a lot better than then.  She does not have any other concerns at this time.   O: BP (!) 123/93 (BP Location: Right Arm)    Pulse (!) 103    Temp 98.2 F (36.8 C) (Oral)    Resp 20    LMP 03/29/2021    SpO2 96%   General: Obese female, sitting up in bed, husband at bedside Cardiovascular: Mildly tachycardic Respiratory: mildly increased work of breathing, restricted lung expansion, mild wheezing, on 2 L [place]  A/P: Sepsis, likely secondary to multifocal pneumonia Asthma exacerbation Patient meets sepsis criteria with elevated lactic acid, tachycardia, tachypnea and source of multifocal pneumonia.  Picture is complicated by acute asthma exacerbation as well. - Patient stable on 2 L [place] - DuoNebs every 4 hours - Albuterol nebs every 2 hours PRN - Continue azithromycin and ceftriaxone  Hyperglycemia, T2DM Patient currently receiving steroids, also received IM prednisone 2 days ago.  CBGs are significantly elevated. - CBGs as ordered - Continue sliding scale insulin - Continue Semglee 50U twice daily, may need to increase   Sajad Glander, DO 04/25/2021, 10:16 PM PGY-2, [facility] Night Resident  Please page [phone] with questions.

## 2021-04-25 NOTE — Hospital Course (Addendum)
[name] is a 51 y.o. female presenting with respiratory distress secondary to asthma exacerbation in the setting of multifocal pneumonia. PMH is significant for asthma, iron deficiency anemia, hypothyroidism, depression, anxiety, CKD, morbid obesity.  Sepsis   Multifocal pneumonia Patient met sepsis criteria with lactic acidosis upon admission with CT scan showing likely source of multifocal pneumonia. Patient was treated with Ceftriaxone and Azithromycin for CAP coverage. Pt was transitioned to oral Cefdinir 300 mg BID for a total of 7 days of treatment and to oral azithromycin 500 mg daily for a total of 5 days of treatment.   Asthma exacerbation Patient admitted with worsening dyspnea despite attempted outpatient management. In the ER, patient received solumedrol, racemic epi, and mag with improvement. During hospitalization, patient received scheduled nebulizer treatments and supplemental oxygen with significant improvement. Patient weaned off oxygen and transitioned to room air, stable on this 24 hours prior to discharge.   All other chronic conditions were stable and home medications continued.    Issues for follow-up: Consider pulmonology referral. Findings on CTA chest suggestive of PAH. Ensure respiratory status continues to improve.  Ensure patient completes course of antibiotics.  Placed on 3 more days of steroids at discharge, consider CBG and adjust diabetic regimen as appropriate.

## 2021-04-25 NOTE — Sepsis Progress Note (Signed)
[facility] is following this code sepsis ?

## 2021-04-25 NOTE — ED Triage Notes (Signed)
Was seen at [facility] Tuesday , today called EMS for SOB/asthma exacerbation, given alb, then duoneb, epi 0.3 sq and 125mg  solumedrol and mag, wheezing upper lobes and diminished lower lobes, 12 lead unremarkable.

## 2021-04-25 NOTE — ED Notes (Signed)
Patient transported to X-ray 

## 2021-04-25 NOTE — ED Provider Notes (Signed)
[facility] EMERGENCY DEPARTMENT Provider Note   CSN: [id number] Arrival date & time: 04/25/21  9798     History  Chief Complaint  Patient presents with   Respiratory Distress    [name] is a 51 y.o. female.  HPI     51 year old female comes in with chief complaint of respiratory distress She has history of diabetes, morbid obesity, CKD, anxiety, adult onset asthma.  Patient indicates that she went on Tuesday to urgent care with some wheezing and cough, shortness of breath.  She was given DuoNeb, nebulizer and discharged.  She never got better after the visit.  She started noticing yellow-green phlegm followed by bloody sputum today, with worsening shortness of breath prompting her to come to the ER.  Pt has no hx of PE, DVT and denies any exogenous hormone (testosterone / estrogen) use, long distance travels or surgery in the past 6 weeks, active cancer, recent immobilization.   Home Medications Prior to Admission medications   Medication Sig Start Date End Date Taking? Authorizing Provider  albuterol (VENTOLIN HFA) 108 (90 Base) MCG/ACT inhaler Inhale 2 puffs into the lungs every 4 (four) hours as needed for wheezing or shortness of breath. 11/23/20   Violet Baldy, MD  benzonatate (TESSALON) 100 MG capsule Take 2 capsules (200 mg total) by mouth 3 (three) times daily as needed for cough. 04/23/21   Chase Picket, MD  budesonide-formoterol (SYMBICORT) 80-4.5 MCG/ACT inhaler Inhale 2 puffs into the lungs in the morning and at bedtime. 04/23/21   Lamptey, Myrene Galas, MD  cephALEXin (KEFLEX) 500 MG capsule Take 1 capsule (500 mg total) by mouth 2 (two) times daily for 7 days. 04/19/21 04/26/21  Lurline Del, DO  Dulaglutide (TRULICITY) 9.21 JH/4.1DE SOPN Inject 0.75 mg into the skin once a week. 04/15/21   [name], DO  famotidine (PEPCID) 20 MG tablet Take 20 mg by mouth 2 (two) times daily.    [provider]  FLUoxetine (PROZAC) 40 MG  capsule Take 2 capsules (80 mg total) by mouth daily. 02/18/21   Salley Slaughter, NP  fluticasone (FLONASE) 50 MCG/ACT nasal spray Place 1 spray into both nostrils daily. 1 spray in each nostril every day 03/01/20   Milus Banister C, DO  fluticasone furoate-vilanterol (BREO ELLIPTA) 200-25 MCG/ACT AEPB Inhale 1 puff into the lungs daily. 04/15/21   [name], DO  gabapentin (NEURONTIN) 100 MG capsule Take 3 capsules (300 mg total) by mouth 3 (three) times daily. 04/15/21   [name], DO  guaiFENesin (MUCINEX) 600 MG 12 hr tablet Take 1 tablet (600 mg total) by mouth 2 (two) times daily. 04/23/21   Lamptey, Myrene Galas, MD  hydrOXYzine (VISTARIL) 50 MG capsule Take 1 capsule (50 mg total) by mouth 3 (three) times daily as needed for itching. 02/18/21   Salley Slaughter, NP  insulin aspart (NOVOLOG) 100 UNIT/ML FlexPen Inject 10 units with Lunch and 15 units with Dinner 12/20/20   [name], DO  Insulin Glargine (BASAGLAR KWIKPEN) 100 UNIT/ML Inject 65 Units into the skin 2 (two) times daily. Take 65U twice a day 04/22/21   [name], DO  ipratropium-albuterol (DUONEB) 0.5-2.5 (3) MG/3ML SOLN Take 3 mLs by nebulization 3 (three) times daily as needed. 10/12/20   Domenic Moras, PA-C  levothyroxine (SYNTHROID) 300 MCG tablet Take 1 tablet by mouth once daily 04/01/21   [name], DO  lisinopril (ZESTRIL) 5 MG tablet Take 1 tablet by mouth once daily 04/01/21  [name], DO  LORazepam (ATIVAN) 1 MG tablet TAKE 1 TABLET BY MOUTH EVERY 8 HOURS AS NEEDED FOR ANXIETY 04/01/21   [name], DO  metFORMIN (GLUCOPHAGE-XR) 500 MG 24 hr tablet Take 2 tablets (1,000 mg total) by mouth 2 (two) times daily with a meal. 08/09/20   Hensel, Jamal Collin, MD  metoprolol succinate (TOPROL-XL) 25 MG 24 hr tablet Take 1 tablet by mouth once daily 04/01/21   [name], DO  mirtazapine (REMERON) 30 MG tablet Take 1 tablet (30 mg total) by mouth daily. 02/18/21    Salley Slaughter, NP  predniSONE (DELTASONE) 10 MG tablet Take 4 tablets (40 mg total) by mouth daily for 5 days. 04/23/21 04/28/21  Chase Picket, MD  rosuvastatin (CRESTOR) 10 MG tablet Take 1 tablet (10 mg total) by mouth daily. 04/16/21   [name], DO  Vitamin D, Ergocalciferol, (DRISDOL) 50000 units CAPS capsule Take 1 capsule (50,000 Units total) by mouth every 7 (seven) days. Sunday Patient not taking: Reported on 12/18/2020 12/04/16   Verner Mould, MD      Allergies    Ace inhibitors, Haldol [haloperidol lactate], Nsaids, and Latex    Review of Systems   Review of Systems  Constitutional:  Positive for activity change.  Respiratory:  Positive for cough and shortness of breath.   All other systems reviewed and are negative.  Physical Exam Updated Vital Signs BP 124/74 (BP Location: Right Arm)    Pulse (!) 102    Temp 98.2 F (36.8 C) (Oral)    Resp 16    LMP 03/29/2021    SpO2 94%  Physical Exam Vitals and nursing note reviewed.  Constitutional:      Appearance: She is well-developed.  HENT:     Head: Atraumatic.  Cardiovascular:     Rate and Rhythm: Tachycardia present.  Pulmonary:     Effort: Respiratory distress present.     Breath sounds: Wheezing present.  Musculoskeletal:     Cervical back: Normal range of motion and neck supple.     Right lower leg: No edema.     Left lower leg: No edema.  Skin:    General: Skin is warm and dry.  Neurological:     Mental Status: She is alert and oriented to person, place, and time.    ED Results / Procedures / Treatments   Labs (all labs ordered are listed, but only abnormal results are displayed) Labs Reviewed  BASIC METABOLIC PANEL - Abnormal; Notable for the following components:      Result Value   CO2 21 (*)    Glucose, Bld 319 (*)    BUN 24 (*)    Creatinine, Ser 1.66 (*)    Calcium 8.2 (*)    GFR, Estimated 37 (*)    All other components within normal limits  CBC WITH  DIFFERENTIAL/PLATELET - Abnormal; Notable for the following components:   WBC 10.9 (*)    Hemoglobin 9.7 (*)    HCT 33.0 (*)    MCH 24.7 (*)    MCHC 29.4 (*)    Platelets 522 (*)    All other components within normal limits  LACTIC ACID, PLASMA - Abnormal; Notable for the following components:   Lactic Acid, Venous 2.3 (*)    All other components within normal limits  I-STAT VENOUS BLOOD GAS, ED - Abnormal; Notable for the following components:   pO2, Ven 77.0 (*)    Calcium, Ion 1.04 (*)    HCT  34.0 (*)    Hemoglobin 11.6 (*)    All other components within normal limits  RESP PANEL BY RT-PCR (FLU A&B, COVID) ARPGX2  CULTURE, BLOOD (ROUTINE X 2)  CULTURE, BLOOD (ROUTINE X 2)  PROTIME-INR  APTT  LACTIC ACID, PLASMA  URINALYSIS, ROUTINE W REFLEX MICROSCOPIC    EKG EKG Interpretation  Date/Time:  Thursday April 25 2021 08:43:51 EST Ventricular Rate:  106 PR Interval:  147 QRS Duration: 95 QT Interval:  351 QTC Calculation: 467 R Axis:   56 Text Interpretation: Sinus tachycardia Low voltage, precordial leads Probable anteroseptal infarct, old No acute changes No significant change since last tracing Confirmed by Varney Biles [phone]) on 04/25/2021 11:48:41 AM  Radiology DG Chest 2 View  Result Date: 04/25/2021 CLINICAL DATA:  Severe dyspnea EXAM: CHEST - 2 VIEW COMPARISON:  11/23/2020 chest radiograph. FINDINGS: Stable cardiomediastinal silhouette with mild cardiomegaly. No pneumothorax. No pleural effusion. Patchy consolidation in the right upper lobe. Clear left lung. IMPRESSION: Patchy right upper lobe consolidation, suspicious for pneumonia. Recommend follow-up chest radiographs to resolution. Electronically Signed   By: Ilona Sorrel M.D.   On: 04/25/2021 09:57   CT Angio Chest PE W and/or Wo Contrast  Result Date: 04/25/2021 CLINICAL DATA:  Evaluate for acute pulmonary embolus. Wheezing. Diminished lung sounds in the lower lung zones. EXAM: CT ANGIOGRAPHY CHEST WITH  CONTRAST TECHNIQUE: Multidetector CT imaging of the chest was performed using the standard protocol during bolus administration of intravenous contrast. Multiplanar CT image reconstructions and MIPs were obtained to evaluate the vascular anatomy. RADIATION DOSE REDUCTION: This exam was performed according to the departmental dose-optimization program which includes automated exposure control, adjustment of the mA and/or kV according to patient size and/or use of iterative reconstruction technique. CONTRAST:  71mL OMNIPAQUE IOHEXOL 350 MG/ML SOLN COMPARISON:  None. FINDINGS: Cardiovascular: Exam detail is diminished secondary to patient body habitus and suboptimal pulmonary arterial opacification. Within these limitations the main pulmonary artery appears patent. There is no lobar pulmonary artery filling defects identified. Beyond the lobar pulmonary arteries the study is markedly limited. Increase caliber of the main pulmonary artery is noted compatible with pulmonary arterial hypertension. Heart size appears normal.  No pericardial effusion. Mediastinum/Nodes: No enlarged mediastinal, hilar, or axillary lymph nodes. Thyroid gland, trachea, and esophagus demonstrate no significant findings. Lungs/Pleura: There is no pleural effusion. Extensive multifocal nodular airspace densities with surrounding ground-glass attenuation is noted throughout the right upper lobe, superior segment of right lower lobe and right middle lobe. The left lung appears clear. No pneumothorax identified bilaterally. Upper Abdomen: No acute abnormality. Musculoskeletal: Spondylosis identified within the lower thoracic spine. Review of the MIP images confirms the above findings. IMPRESSION: 1. Exam detail is diminished secondary to patient body habitus and suboptimal pulmonary arterial opacification. Within these limitations the main pulmonary arteries and lobar pulmonary arteries appear patent. No signs central obstructing pulmonary embolus.  Beyond the lobar pulmonary arteries the study is markedly limited. If there is a continued clinical concern for acute pulmonary embolus then lower extremity Doppler study to assess for DVT, nuclear medicine pulmonary perfusion scan, or repeat CTA of the chest should be considered. 2. Extensive multifocal nodular airspace densities with surrounding ground-glass attenuation is noted throughout the right upper lobe, superior segment of right lower lobe and right middle lobe. Findings are favored to represent multifocal pneumonia, atypical infection not excluded. Follow-up imaging to ensure resolution is suggested. 3. Increase caliber of the main pulmonary artery compatible with pulmonary arterial hypertension. Electronically Signed  By: Kerby Moors M.D.   On: 04/25/2021 12:43    Procedures .Critical Care Performed by: Varney Biles, MD Authorized by: Varney Biles, MD   Critical care provider statement:    Critical care time (minutes):  52   Critical care was necessary to treat or prevent imminent or life-threatening deterioration of the following conditions:  Respiratory failure and sepsis   Critical care was time spent personally by me on the following activities:  Development of treatment plan with patient or surrogate, discussions with consultants, evaluation of patient's response to treatment, examination of patient, ordering and review of laboratory studies, ordering and review of radiographic studies, ordering and performing treatments and interventions, pulse oximetry, re-evaluation of patient's condition and review of old charts    Medications Ordered in ED Medications  lactated ringers infusion (0 mLs Intravenous Stopped 04/25/21 1357)  lactated ringers infusion ( Intravenous New Bag/Given 04/25/21 1127)  cefTRIAXone (ROCEPHIN) 2 g in sodium chloride 0.9 % 100 mL IVPB (0 g Intravenous Stopped 04/25/21 1229)  azithromycin (ZITHROMAX) 500 mg in sodium chloride 0.9 % 250 mL IVPB (500 mg  Intravenous New Bag/Given 04/25/21 1244)  ipratropium-albuterol (DUONEB) 0.5-2.5 (3) MG/3ML nebulizer solution 3 mL (has no administration in time range)  magnesium sulfate IVPB 2 g 50 mL (has no administration in time range)  albuterol (PROVENTIL) (2.5 MG/3ML) 0.083% nebulizer solution (10 mg/hr Nebulization Given 04/25/21 1038)  ipratropium (ATROVENT) nebulizer solution 0.5 mg (0.5 mg Nebulization Given 04/25/21 1038)  magnesium sulfate IVPB 2 g 50 mL (0 g Intravenous Stopped 04/25/21 1112)  methylPREDNISolone sodium succinate (SOLU-MEDROL) 125 mg/2 mL injection 125 mg (125 mg Intravenous Given 04/25/21 1038)  Racepinephrine HCl 2.25 % nebulizer solution 0.5 mL (0.5 mLs Nebulization Given 04/25/21 1112)  midazolam (VERSED) injection 2 mg (2 mg Intravenous Given 04/25/21 1239)  acetaminophen (TYLENOL) tablet 650 mg (650 mg Oral Given 04/25/21 1237)  iohexol (OMNIPAQUE) 350 MG/ML injection 100 mL (82 mLs Intravenous Contrast Given 04/25/21 1230)    ED Course/ Medical Decision Making/ A&P                           Medical Decision Making Amount and/or Complexity of Data Reviewed Labs: ordered. Radiology: ordered.  Risk OTC drugs. Prescription drug management. Decision regarding hospitalization.   51 year old female with history of anxiety, adult-onset asthma, diabetes comes in with chief complaint of shortness of breath, wheezing, hemoptysis.  It appears that her symptoms have slowly progressed since she went to the urgent care on Tuesday.  Differential diagnosis includes pneumonia, pleural effusion, pulmonary edema, PE, COVID-19, bronchitis, anxiety attack.  The initial plan is to continue respiratory support with DuoNeb therapy, oxygen support and get basic labs and chest x-ray   Reassessment: After DuoNeb, patient is moving air better.  She however still has mild faint wheezing in the upper lobes, no wheezing in the upper airway.  She has reduced O2 requirement.  Patient did not turn  around to the extent we expected.  We will get CT angiogram to ensure there is no blood clot.  I independently visualized the x-ray, there is evidence of right and left-sided increased opacity compared to previous x-rays.  No evidence of pneumothorax.  Reassessment: CT PE is negative for any acute pulmonary embolism.  Radiologist interpretation reviewed, appears to be having multifocal pneumonia. Patient has already received IV antibiotics. She is still on 2 L.  When we discontinue 2 L, patient gets tachypneic easily.  She had  already received IV Versed, which seem to have helped some as well.  Given the multifocal pneumonia, increased work of breathing, mild hypoxia we will admit the patient to the hospital.  Final Clinical Impression(s) / ED Diagnoses Final diagnoses:  Multifocal pneumonia  Severe persistent asthma with exacerbation  Severe sepsis [facility])    Rx / DC Orders ED Discharge Orders     None         Varney Biles, MD 04/25/21 1453

## 2021-04-26 ENCOUNTER — Encounter (HOSPITAL_COMMUNITY): Payer: Self-pay | Admitting: Student

## 2021-04-26 ENCOUNTER — Other Ambulatory Visit: Payer: Self-pay

## 2021-04-26 DIAGNOSIS — F419 Anxiety disorder, unspecified: Secondary | ICD-10-CM

## 2021-04-26 LAB — CBC
HCT: 32.7 % — ABNORMAL LOW (ref 36.0–46.0)
Hemoglobin: 9.8 g/dL — ABNORMAL LOW (ref 12.0–15.0)
MCH: 24.7 pg — ABNORMAL LOW (ref 26.0–34.0)
MCHC: 30 g/dL (ref 30.0–36.0)
MCV: 82.4 fL (ref 80.0–100.0)
Platelets: 508 10*3/uL — ABNORMAL HIGH (ref 150–400)
RBC: 3.97 MIL/uL (ref 3.87–5.11)
RDW: 14.8 % (ref 11.5–15.5)
WBC: 10.5 10*3/uL (ref 4.0–10.5)
nRBC: 0.2 % (ref 0.0–0.2)

## 2021-04-26 LAB — BASIC METABOLIC PANEL
Anion gap: 10 (ref 5–15)
BUN: 26 mg/dL — ABNORMAL HIGH (ref 6–20)
CO2: 25 mmol/L (ref 22–32)
Calcium: 8.6 mg/dL — ABNORMAL LOW (ref 8.9–10.3)
Chloride: 103 mmol/L (ref 98–111)
Creatinine, Ser: 1.51 mg/dL — ABNORMAL HIGH (ref 0.44–1.00)
GFR, Estimated: 42 mL/min — ABNORMAL LOW (ref >60)
Glucose, Bld: 346 mg/dL — ABNORMAL HIGH (ref 70–99)
Potassium: 4.8 mmol/L (ref 3.5–5.1)
Sodium: 138 mmol/L (ref 135–145)

## 2021-04-26 LAB — GLUCOSE, CAPILLARY
Glucose-Capillary: 380 mg/dL — ABNORMAL HIGH (ref 70–99)
Glucose-Capillary: 428 mg/dL — ABNORMAL HIGH (ref 70–99)
Glucose-Capillary: 464 mg/dL — ABNORMAL HIGH (ref 70–99)
Glucose-Capillary: 384 mg/dL — ABNORMAL HIGH (ref 70–99)

## 2021-04-26 MED ORDER — ACETAMINOPHEN 325 MG PO TABS
650.0000 mg | ORAL_TABLET | Freq: Once | ORAL | Status: AC
  Administered 2021-04-26: 650 mg via ORAL
  Filled 2021-04-26: qty 2

## 2021-04-26 MED ORDER — IPRATROPIUM-ALBUTEROL 0.5-2.5 (3) MG/3ML IN SOLN
3.0000 mL | Freq: Two times a day (BID) | RESPIRATORY_TRACT | Status: DC
  Administered 2021-04-26 – 2021-04-27 (×2): 3 mL via RESPIRATORY_TRACT
  Filled 2021-04-26 (×2): qty 3

## 2021-04-26 MED ORDER — FAMOTIDINE 20 MG PO TABS
20.0000 mg | ORAL_TABLET | Freq: Two times a day (BID) | ORAL | Status: DC
Start: 2021-04-26 — End: 2021-04-28
  Administered 2021-04-26 – 2021-04-28 (×5): 20 mg via ORAL
  Filled 2021-04-26 (×5): qty 1

## 2021-04-26 MED ORDER — INSULIN ASPART 100 UNIT/ML IJ SOLN
0.0000 [IU] | Freq: Three times a day (TID) | INTRAMUSCULAR | Status: DC
  Administered 2021-04-26 (×2): 20 [IU] via SUBCUTANEOUS
  Administered 2021-04-27: 11 [IU] via SUBCUTANEOUS
  Administered 2021-04-27: 20 [IU] via SUBCUTANEOUS
  Administered 2021-04-27: 15 [IU] via SUBCUTANEOUS
  Administered 2021-04-28: 11 [IU] via SUBCUTANEOUS
  Administered 2021-04-28: 15 [IU] via SUBCUTANEOUS

## 2021-04-26 MED ORDER — INSULIN GLARGINE-YFGN 100 UNIT/ML ~~LOC~~ SOLN
65.0000 [IU] | Freq: Two times a day (BID) | SUBCUTANEOUS | Status: DC
  Administered 2021-04-26 – 2021-04-27 (×2): 65 [IU] via SUBCUTANEOUS
  Filled 2021-04-26 (×3): qty 0.65

## 2021-04-26 MED ORDER — POLYETHYLENE GLYCOL 3350 17 G PO PACK
17.0000 g | PACK | Freq: Every day | ORAL | Status: DC
  Administered 2021-04-26 – 2021-04-28 (×3): 17 g via ORAL
  Filled 2021-04-26 (×3): qty 1

## 2021-04-26 MED ORDER — INSULIN ASPART 100 UNIT/ML IJ SOLN
0.0000 [IU] | Freq: Every day | INTRAMUSCULAR | Status: DC
  Administered 2021-04-26 – 2021-04-27 (×2): 5 [IU] via SUBCUTANEOUS

## 2021-04-26 MED ORDER — PREDNISONE 20 MG PO TABS
40.0000 mg | ORAL_TABLET | Freq: Every day | ORAL | Status: DC
  Administered 2021-04-27: 40 mg via ORAL
  Filled 2021-04-26: qty 2

## 2021-04-26 NOTE — Progress Notes (Signed)
Family Medicine Teaching Service °Daily Progress Note °Intern Pager: [phone] ° °Patient name: [name] Medical record number: [id number] °Date of birth: 02/27/1971 Age: 51 y.o. Gender: female ° °Primary Care Provider: Espinoza, Alejandra, DO °Consultants: RT °Code Status: Full ° °Pt Overview and Major Events to Date:  °2/17- admitted ° °Assessment and Plan: °Patient is a 51-year-old female presenting with respiratory distress due to asthma exacerbation in the setting of multifocal pneumonia.  Past medical history significant for asthma, iron deficiency anemia, hypothyroidism, depression, anxiety, CKD, morbid obesity. ° °Asthma exacerbation °Pt did not have albuterol neb q2h prn overnight and did not receive duonebs from 1917 until 0530 this morning.  Reasons for new DuoNebs every night states that RT was not aware.  Patient states that she was not aware she needed to ask for the albuterol as needed but that she did feel like she needed it and she was waiting for someone to come.  She is currently on 1.5 L oxygen via nasal cannula.  States she still is having shortness of breath, not yet able to speak in complete sentences.  States she is feeling much better today than she felt yesterday but not at baseline.  On exam she has respiratory expiratory wheezes in all lung fields with good air movement. °-Albuterol 2.5 mg nebulizer every 2 hours as needed °-DuoNeb every 4 hours °-Breo Ellipta 1 puff daily °-Solu-Medrol 125 mg daily, will switch to oral prednisone when appropriate °-Keep oxygen saturation greater than 92% °-Continuous pulse ox °-Supplemental oxygen via nasal cannula as needed °-PT consulted ° °Multifocal pneumonia °Pt met sepsis criteria on admission d/t tachycardia, tachypnea, increased WBC to 10.9 and evidence of multifocal PNA. Lactic acid increased x3, 2.3> 2.6> 3.1 yesterday after admission.  WBC decreased to 10.5 this morning.  No fevers.  °-Azithromycin 500 mg IV daily for total 5  days °-Ceftriaxone 2 g IV daily for total of 5 days °-Supplemental O2 as needed °-Keep O2 sat greater than 92% °-Continue pulse ox °-Follow-up blood cultures ° °Type 2 diabetes °Hemoglobin A1c from 04/15/2021 was 11.  All medications include Trulicity 0.75 mg weekly, NovoLog 15 units with lunch and 20 units with dinner, Basaglar 65 units twice daily and metformin 1000 mg twice daily.  Patient also takes gabapentin 300 mg 3 times daily.  Patient elevated glucose in mid 500s overnight.  CBG of 380 this morning. °-CBGs 3 times daily with meals and at bedtime with resistant SSI °-increasing Semglee to 65 units twice daily ° °Anxiety and depression °Home medications include Prozac 80 mg daily, Atarax 50 mg 3 times daily as needed, Ativan 1 mg every 8 hours as needed, and Remeron 30 mg daily °-Continue home meds ° °Hypothyroidism °TSH on 04/15/2021 of 4.6.  Home meds include levothyroxine 300 mcg daily °-Continue home med ° °Hypertension  °blood pressure this morning elevated to 155/88. °-Continue home medications of lisinopril 5 mg daily and metoprolol 25 mg daily ° °Hyperlipidemia ° lipid panel from 04/15/2021 shows a total cholesterol 201 triglycerides of 231 LDL elevated to 112 °-Continue home medication of Crestor 10 mg daily ° °FEN/GI: Carb modified °PPx: Lovenox °Dispo: Pending clinical improvement ° °Subjective:  °Patient states she is feeling better than she did yesterday but is still feeling very short of breath and unable to speak in complete sentences. ° °Objective: °Temp:  [97.9 °F (36.6 °C)-98.5 °F (36.9 °C)] 97.9 °F (36.6 °C) (02/17 0421) °Pulse Rate:  [88-110] 88 (02/17 0421) °Resp:  [16-37] 20 (02/17 0421) °BP: (  123-159)/(60-102) 155/88 (02/17 0421) °SpO2:  [90 %-100 %] 90 % (02/17 0421) °Physical Exam: °General: Patient sitting up in bed, NAD °Cardiovascular: RRR, normal S1/S2, no murmurs °Respiratory: Inspiratory next very wheezes in all lung fields, unable to speak in complete  sentences ° ° °Laboratory: °Recent Labs  °Lab 04/25/21 °0845 04/25/21 °1037  °WBC 10.9*  --   °HGB 9.7* 11.6*  °HCT 33.0* 34.0*  °PLT 522*  --   ° °Recent Labs  °Lab 04/25/21 °0845 04/25/21 °1037  °NA 139 140  °K 4.4 4.9  °CL 107  --   °CO2 21*  --   °BUN 24*  --   °CREATININE 1.66*  --   °CALCIUM 8.2*  --   °GLUCOSE 319*  --   ° ° ° °, , DO °04/26/2021, 5:16 AM °PGY-1, [facility] °FPTS Intern pager: [phone], text pages welcome ° °

## 2021-04-26 NOTE — Progress Notes (Addendum)
Inpatient Diabetes Program Recommendations  AACE/ADA: New Consensus Statement on Inpatient Glycemic Control (2015)  Target Ranges:  Prepandial:   less than 140 mg/dL      Peak postprandial:   less than 180 mg/dL (1-2 hours)      Critically ill patients:  140 - 180 mg/dL   Lab Results  Component Value Date   GLUCAP 428 (H) 04/26/2021   HGBA1C 11.0 (A) 04/15/2021    Review of Glycemic Control Asthma exacerbation/multifocal PNA Diabetes history: DM 2 Outpatient Diabetes medications: Trulicity 0.01 weekly, Novolog 15 units lunch 20 units dinner, metformin 1000 mg bid Current orders for Inpatient glycemic control:  Semglee 65 units bid Novolog 0-20 units tid + hs  PO prednisone 40 mg Daily A1c 11% on 2/6  Inpatient Diabetes Program Recommendations:    -  Also consider Novolog 6 units tid meal coverage while on steroids  Recent injections in knee joint raising glucose and A1c levels. Will need higher doses at home around these treatments. Solumedrol 60 mg given on 2/14 at urgent care , then prescribed PO prednisone 40 mg Daily for 5 days.  Will need recheck on glucose when off of steroids for 3-4 months.  -   When on steroids will need increased doses of insulin at home. Pt will need frequent monitoring at home.   Will see pt this admission.  Addendum 2:29 pm:  Spoke with pt at bedside. She reports not checking glucose as she should at home. Pt confirmed multiple steroids over the course of 2-3 months. Explained how important glucose checks are. Explained when to contact PCP for insulin titration if needed when on steroids.  Thanks, Tama Headings RN, MSN, BC-ADM Inpatient Diabetes Coordinator Team Pager [phone] (8a-5p)

## 2021-04-26 NOTE — Progress Notes (Signed)
°  Transition of Care [facility]) Screening Note   Patient Details  Name: [name] Date of Birth: 1970-05-14   Transition of Care [facility]) CM/SW Contact:    Pollie Friar, RN Phone Number: 04/26/2021, 2:46 PM    Transition of Care Department [facility]) has reviewed patient and no TOC needs have been identified at this time. We will continue to monitor patient advancement through interdisciplinary progression rounds. If new patient transition needs arise, please place a TOC consult.

## 2021-04-26 NOTE — Progress Notes (Signed)
FPTS Interim Progress Note  Notified by nurse regarding mood concerns that patient may be experiencing. Went to bedside to check on patient. She is sitting up on the bed watching tv, in no acute distress. She says that her breathing and mood have improved. When asked, she admits that she experienced suicidal ideations briefly earlier which is when she confided in the nurse. Patient currently denies suicidal ideations and plan, she shares that she never had a plan. She says that her husband and son are currently arguing at home and is sad that she is not home to help resolve things. We talked about her needing to be well so that she can handle other things, she agreed. After much reassurance provided, patient reports that she is fine and will let us know if she needs anything.   Donney Dice, DO 04/26/2021, 3:07 PM PGY-2, [facility] pager [phone]

## 2021-04-26 NOTE — Progress Notes (Signed)
FPTS Brief Progress Note  S:Patient reports that her breathing has continued to improve. She notes her heart racing after getting albuterol nebulizer. She has not had a bowel movement recently and would like to have something to help.   O: BP (!) 146/82 (BP Location: Right Wrist)    Pulse 95    Temp 98.4 F (36.9 C) (Oral)    Resp 18    LMP 03/29/2021    SpO2 98%   Gen: sitting up in bed, NAD CV: RRR Resp: mild expiratory wheeze, no retractions, no increased WOB, speaking in full sentences  A/P: Asthma exacerbation   CAP - continue scheduled and PRN nebulizer - spirometer as needed - Likely will transition to oral antibiotics if stable in the morning  Constipation - Miralax daily starting tonight   LillandLorrin Goodell, DO 04/26/2021, 10:18 PM PGY-2, [facility] Night Resident  Please page [phone] with questions.

## 2021-04-27 LAB — GLUCOSE, CAPILLARY
Glucose-Capillary: 302 mg/dL — ABNORMAL HIGH (ref 70–99)
Glucose-Capillary: 253 mg/dL — ABNORMAL HIGH (ref 70–99)
Glucose-Capillary: 394 mg/dL — ABNORMAL HIGH (ref 70–99)
Glucose-Capillary: 357 mg/dL — ABNORMAL HIGH (ref 70–99)

## 2021-04-27 MED ORDER — INSULIN GLARGINE-YFGN 100 UNIT/ML ~~LOC~~ SOLN
75.0000 [IU] | Freq: Two times a day (BID) | SUBCUTANEOUS | Status: DC
  Administered 2021-04-27 – 2021-04-28 (×2): 75 [IU] via SUBCUTANEOUS
  Filled 2021-04-27 (×3): qty 0.75

## 2021-04-27 MED ORDER — CEFDINIR 300 MG PO CAPS
300.0000 mg | ORAL_CAPSULE | Freq: Two times a day (BID) | ORAL | Status: DC
  Administered 2021-04-27 – 2021-04-28 (×3): 300 mg via ORAL
  Filled 2021-04-27 (×4): qty 1

## 2021-04-27 MED ORDER — ACETAMINOPHEN 325 MG PO TABS
650.0000 mg | ORAL_TABLET | Freq: Four times a day (QID) | ORAL | Status: DC | PRN
  Administered 2021-04-27: 650 mg via ORAL
  Filled 2021-04-27: qty 2

## 2021-04-27 MED ORDER — PREDNISONE 20 MG PO TABS
20.0000 mg | ORAL_TABLET | Freq: Every day | ORAL | Status: AC
  Administered 2021-04-28: 20 mg via ORAL
  Filled 2021-04-27: qty 1

## 2021-04-27 MED ORDER — MIRTAZAPINE 15 MG PO TABS: 30.0000 mg | ORAL_TABLET | Freq: Every day | ORAL | Status: DC

## 2021-04-27 MED ORDER — IPRATROPIUM-ALBUTEROL 0.5-2.5 (3) MG/3ML IN SOLN
3.0000 mL | RESPIRATORY_TRACT | Status: DC
  Administered 2021-04-27 – 2021-04-28 (×7): 3 mL via RESPIRATORY_TRACT
  Filled 2021-04-27 (×8): qty 3

## 2021-04-27 MED ORDER — AZITHROMYCIN 500 MG PO TABS
500.0000 mg | ORAL_TABLET | Freq: Every day | ORAL | Status: DC
  Administered 2021-04-27 – 2021-04-28 (×2): 500 mg via ORAL
  Filled 2021-04-27 (×2): qty 1

## 2021-04-27 NOTE — Progress Notes (Signed)
FPTS Brief Progress Note  S: Doing well. States she was able to ambulate without oxygen today.   O: BP (!) 141/83 (BP Location: Right Arm)    Pulse 94    Temp 98.7 F (37.1 C) (Oral)    Resp 20    LMP 03/29/2021    SpO2 97%   General: Appears well, no acute distress. Age appropriate. Respiratory: Coarse breath sounds with L>R expiratory wheezing, normal effort Neuro: alert and oriented Psych: normal affect  A/P: Asthma exacerbation Multifocal pneumonia Afebrile. On room air. Doing well. Likely discharge tomorrow.  - Orders reviewed. Labs for AM ordered, which was adjusted as needed.   Gerlene Fee, DO 04/27/2021, 9:15 PM PGY-3, [facility] Night Resident  Please page [phone] with questions.

## 2021-04-27 NOTE — Progress Notes (Signed)
FPTS Interim Progress Note  S: Patient is lying in bed receiving DuoNeb treatment with husband by her side.  Patient has been very tired today, patient still feels she is not breathing well and would like to stay overnight. O: BP (!) 148/93 (BP Location: Right Wrist)    Pulse 95    Temp 98.7 F (37.1 C) (Oral)    Resp (!) 22    LMP 03/29/2021    SpO2 94%     A/P: Asthma exacerbation in the setting of multifocal pneumonia -duo nebs for every 4 hours -Albuterol nebulizer every 2 hours as needed -Keep oxygen saturation greater than 88% -Ambulatory pulse ox -Antibiotics have been switched to oral -Continue oral prednisone with dose tapered to 20 mg tomorrow  Precious Gilding, DO 04/27/2021, 3:50 PM PGY-1, [place] Medicine Service pager [phone]

## 2021-04-27 NOTE — Progress Notes (Signed)
Family Medicine Teaching Service Daily Progress Note Intern Pager: [phone]  Patient name: [name] Medical record number: [id number] Date of birth: Feb 26, 1971 Age: 51 y.o. Gender: female  Primary Care Provider: [name], DO Consultants: RT Code Status: Full  Pt Overview and Major Events to Date:  2/17-admitted  Assessment and Plan: Patient is 51 year old female admitted for respiratory distress due to asthma exacerbation in setting of multifocal pneumonia.  Past medical history significant for asthma, iron deficiency anemia, hypothyroidism, depression, anxiety, CKD, morbid obesity.  Asthma exacerbation Yesterday afternoon patient was switched from scheduled duonebs every 4 hours to every 2 hours and still has albuterol neb on as needed which she used this morning.  Yesterday around noon patient was transitioned to room air but around 8:00 this morning was placed on 5 L by RT per patient.  Patient was not on continuous pulse ox while I was in the room. Per chart review SPO2 was 98% on 5 L around 0814.  Patient is unsure if the 5 L of oxygen is helping.  States she feels like she just cannot get air in.  Patient does have significant anxiety which also may be playing a role in her symptoms.  Yesterday she was thinking she may be able to go home today however now she states she is not sure that she should.  I spoke with patient's nurse about getting her on continuous pulse ox and to wean oxygen as tolerated.  -Continue duo nebs every 2 hours -Albuterol neb every 2 hours as needed -Prednisone 40 mg today, this will be day 5 -Continuous pulse ox -Keep O2 saturation greater 92% -RT on board -ambulatory pulse ox today  Multifocal pneumonia Patient has remained afebrile, is currently on day 3 of 5 of antibiotics.  Patient states she has had an increase in production of blood-tinged sputum overnight.  Blood cultures show no growth after 2 days. -Continue azithromycin 500 mg daily  for total of 5 days -Continue ceftriaxone 2 g daily for total of 5 days  -Supplemental O2 as needed -Keep O2 sat greater than 92% -Continuous pulse ox -Follow-up on blood cultures  Type 2 diabetes Hemoglobin A1c from 04/15/2021 was 11.  CBG this morning of 302.  Yesterday Semglee was increased to home dose of 65 units twice daily and she received 50 units in the morning and 65 in the evening and had 60 units of short acting insulin. - Increase Semglee to 75 BID -CBGs 3 times daily with meals and at bedtime with resistant SSI  Anxiety and depression Home medications include Prozac 80 mg daily, Atarax 50 mg 3 times daily as needed, Ativan 1 mg 3 times daily as needed, and Remeron 30 mg daily.  Patient has been using her as needed Atarax and Ativan, had Atarax twice yesterday and Ativan twice yesterday and this morning. -Continue home meds  Hypothyroidism -Continue home levothyroxine 300 mcg daily  Hypertension BP elevated to 154/87 this morning -Continue home lisinopril 5 mg daily metoprolol 25 mg daily  Hyperlipidemia -Continue home Crestor 10 mg daily  FEN/GI: Carb modified PPx: Lovenox Dispo: Home later today or tomorrow   Subjective:  Patient states she feels worse today than yesterday in terms of breathing.  States she is feeling like she is breathing through a straw and can't get air in.  Also states her cough has worsened and she is having more frequent production of blood-tinged sputum.  She is unsure if the 5 L of oxygen started this morning  is helping.  Objective: Temp:  [98 F (36.7 C)-99.1 F (37.3 C)] 98.4 F (36.9 C) (02/18 0729) Pulse Rate:  [88-96] 95 (02/18 0729) Resp:  [17-20] 18 (02/18 0729) BP: (125-162)/(76-99) 154/87 (02/18 0729) SpO2:  [92 %-100 %] 98 % (02/18 0814) Physical Exam: General: Patient lying in bed, mild distress d/t anxiety and in feeling that she cannot get air in Cardiovascular: RRR, normal S1/S2 Respiratory: Harsh expiratory lung sounds  and wheezing throughout Psych:very anxious and concerned about her health and whether or not she will get better  Laboratory: Recent Labs  Lab 04/25/21 0845 04/25/21 1037 04/26/21 0904  WBC 10.9*  --  10.5  HGB 9.7* 11.6* 9.8*  HCT 33.0* 34.0* 32.7*  PLT 522*  --  508*   Recent Labs  Lab 04/25/21 0845 04/25/21 1037 04/26/21 0904  NA 139 140 138  K 4.4 4.9 4.8  CL 107  --  103  CO2 21*  --  25  BUN 24*  --  26*  CREATININE 1.66*  --  1.51*  CALCIUM 8.2*  --  8.6*  GLUCOSE 319*  --  346Precious Gilding, DO 04/27/2021, 10:23 AM PGY-1, [facility] Intern pager: [phone], text pages welcome

## 2021-04-27 NOTE — Plan of Care (Signed)
°  Problem: Activity: Goal: Ability to tolerate increased activity will improve Outcome: Progressing   Problem: Clinical Measurements: Goal: Ability to maintain a body temperature in the normal range will improve Outcome: Progressing   Problem: Respiratory: Goal: Ability to maintain adequate ventilation will improve Outcome: Progressing Goal: Ability to maintain a clear airway will improve Outcome: Progressing   Problem: Clinical Measurements: Goal: Ability to maintain clinical measurements within normal limits will improve Outcome: Progressing Goal: Will remain free from infection Outcome: Progressing Goal: Diagnostic test results will improve Outcome: Progressing Goal: Respiratory complications will improve Outcome: Progressing Goal: Cardiovascular complication will be avoided Outcome: Progressing   Problem: Health Behavior/Discharge Planning: Goal: Ability to manage health-related needs will improve Outcome: Progressing   Problem: Coping: Goal: Level of anxiety will decrease Outcome: Progressing   Problem: Elimination: Goal: Will not experience complications related to bowel motility Outcome: Progressing Goal: Will not experience complications related to urinary retention Outcome: Progressing

## 2021-04-28 LAB — BASIC METABOLIC PANEL
Anion gap: 10 (ref 5–15)
BUN: 30 mg/dL — ABNORMAL HIGH (ref 6–20)
CO2: 24 mmol/L (ref 22–32)
Calcium: 8.6 mg/dL — ABNORMAL LOW (ref 8.9–10.3)
Chloride: 102 mmol/L (ref 98–111)
Creatinine, Ser: 1.4 mg/dL — ABNORMAL HIGH (ref 0.44–1.00)
GFR, Estimated: 46 mL/min — ABNORMAL LOW (ref >60)
Glucose, Bld: 331 mg/dL — ABNORMAL HIGH (ref 70–99)
Potassium: 5.1 mmol/L (ref 3.5–5.1)
Sodium: 136 mmol/L (ref 135–145)

## 2021-04-28 LAB — CBC
HCT: 30.6 % — ABNORMAL LOW (ref 36.0–46.0)
Hemoglobin: 9 g/dL — ABNORMAL LOW (ref 12.0–15.0)
MCH: 24.5 pg — ABNORMAL LOW (ref 26.0–34.0)
MCHC: 29.4 g/dL — ABNORMAL LOW (ref 30.0–36.0)
MCV: 83.2 fL (ref 80.0–100.0)
Platelets: 496 10*3/uL — ABNORMAL HIGH (ref 150–400)
RBC: 3.68 MIL/uL — ABNORMAL LOW (ref 3.87–5.11)
RDW: 14.9 % (ref 11.5–15.5)
WBC: 12.5 10*3/uL — ABNORMAL HIGH (ref 4.0–10.5)
nRBC: 0.2 % (ref 0.0–0.2)

## 2021-04-28 LAB — GLUCOSE, CAPILLARY
Glucose-Capillary: 303 mg/dL — ABNORMAL HIGH (ref 70–99)
Glucose-Capillary: 281 mg/dL — ABNORMAL HIGH (ref 70–99)

## 2021-04-28 MED ORDER — PREDNISONE 10 MG PO TABS: 20.0000 mg | ORAL_TABLET | Freq: Every day | ORAL | 0 refills | Status: AC

## 2021-04-28 MED ORDER — AZITHROMYCIN 500 MG PO TABS: 500.0000 mg | ORAL_TABLET | Freq: Every day | ORAL | 0 refills | Status: DC

## 2021-04-28 MED ORDER — CEFDINIR 300 MG PO CAPS: 300.0000 mg | ORAL_CAPSULE | Freq: Two times a day (BID) | ORAL | 0 refills | Status: DC

## 2021-04-28 MED ORDER — AZITHROMYCIN 500 MG PO TABS: 500.0000 mg | ORAL_TABLET | Freq: Every day | ORAL | 0 refills | Status: AC

## 2021-04-28 NOTE — Discharge Summary (Signed)
[name] Discharge Summary  Patient name: [name] Medical record number: [id number] Date of birth: 17-Mar-1970 Age: 51 y.o. Gender: female Date of Admission: 04/25/2021  Date of Discharge: 04/28/2021 Admitting Physician: Samantha Gilding, DO  Primary Care Provider: [name], DO Consultants: none  Indication for Hospitalization: shortness of breath   Discharge Diagnoses/Problem List:  Multifocal pneumonia Asthma exacerbation  Type 2 DM Hypertension  Hypothyroidism Hyperlipidemia Anxiety and depression  Disposition: home  Discharge Condition: medically stable  Discharge Exam:  General: Patient sitting on the edge of the bed, in no acute distress. CV: RRR, no murmurs or gallops auscultated Resp: CTAB with very mild congestion, no wheezing or rales noted, no rhonchi noted, breathing comfortably on room air without signs of respiratory distress. Abdomen: soft, nontender, presence of bowel sounds Ext: no LE edema bilaterally, no distal pulses strong and equal bilaterally   Brief Hospital Course:  [name] is a 51 y.o. female presenting with respiratory distress secondary to asthma exacerbation in the setting of multifocal pneumonia. PMH is significant for asthma, iron deficiency anemia, hypothyroidism, depression, anxiety, CKD, morbid obesity.  Sepsis   Multifocal pneumonia Patient met sepsis criteria with lactic acidosis upon admission with CT scan showing likely source of multifocal pneumonia. Patient was treated with Ceftriaxone and Azithromycin for CAP coverage. Pt was transitioned to oral Cefdinir 300 mg BID for a total of 7 days of treatment and to oral azithromycin 500 mg daily for a total of 5 days of treatment.   Asthma exacerbation Patient admitted with worsening dyspnea despite attempted outpatient management. In the ER, patient received solumedrol, racemic epi, and mag with improvement. During hospitalization, patient  received scheduled nebulizer treatments and supplemental oxygen with significant improvement. Patient weaned off oxygen and transitioned to room air, stable on this 24 hours prior to discharge.   All other chronic conditions were stable and home medications continued.    Issues for Follow Up:  Consider pulmonology referral. Findings on CTA chest suggestive of PAH and for sleep apnea. Ensure respiratory status continues to improve.  Ensure patient completes course of antibiotics.  Placed on 3 more days of steroids at discharge, consider CBG and adjust diabetic regimen as appropriate.   Significant Procedures:  none  Significant Labs and Imaging:  Recent Labs  Lab 04/25/21 0845 04/25/21 1037 04/26/21 0904 04/28/21 0305  WBC 10.9*  --  10.5 12.5*  HGB 9.7* 11.6* 9.8* 9.0*  HCT 33.0* 34.0* 32.7* 30.6*  PLT 522*  --  508* 496*   Recent Labs  Lab 04/25/21 0845 04/25/21 1037 04/26/21 0904 04/28/21 0305  NA 139 140 138 136  K 4.4 4.9 4.8 5.1  CL 107  --  103 102  CO2 21*  --  25 24  GLUCOSE 319*  --  346* 331*  BUN 24*  --  26* 30*  CREATININE 1.66*  --  1.51* 1.40*  CALCIUM 8.2*  --  8.6* 8.6*     Results/Tests Pending at Time of Discharge:  none  Discharge Medications:  Allergies as of 04/28/2021       Reactions   Ace Inhibitors Other (See Comments)   Patient had acute renal failure requiring hemodialysis after a suicide attempt.  Nephro recommended avoiding use.   Haldol [haloperidol Lactate] Other (See Comments)   Tardive dyskinesia   Nsaids Other (See Comments)   Nephro recommended avoiding after acute renal failure requiring hemodialysis associated with a suicide attempt.   Latex Other (See Comments)  Rash around IV site        Medication List     STOP taking these medications    benzonatate 100 MG capsule Commonly known as: TESSALON   cephALEXin 500 MG capsule Commonly known as: Keflex       TAKE these medications    albuterol 108 (90 Base)  MCG/ACT inhaler Commonly known as: VENTOLIN HFA Inhale 2 puffs into the lungs every 4 (four) hours as needed for wheezing or shortness of breath.   azithromycin 500 MG tablet Commonly known as: ZITHROMAX Take 1 tablet (500 mg total) by mouth daily for 3 days. Start taking on: April 29, 2021   Basaglar KwikPen 100 UNIT/ML Inject 65 Units into the skin 2 (two) times daily. Take 65U twice a day   budesonide-formoterol 80-4.5 MCG/ACT inhaler Commonly known as: SYMBICORT Inhale 2 puffs into the lungs in the morning and at bedtime.   cefdinir 300 MG capsule Commonly known as: OMNICEF Take 1 capsule (300 mg total) by mouth every 12 (twelve) hours for 7 doses.   famotidine 20 MG tablet Commonly known as: PEPCID Take 20 mg by mouth 2 (two) times daily.   FLUoxetine 40 MG capsule Commonly known as: PROZAC Take 2 capsules (80 mg total) by mouth daily.   fluticasone 50 MCG/ACT nasal spray Commonly known as: FLONASE Place 1 spray into both nostrils daily. 1 spray in each nostril every day   fluticasone furoate-vilanterol 200-25 MCG/ACT Aepb Commonly known as: Breo Ellipta Inhale 1 puff into the lungs daily.   gabapentin 100 MG capsule Commonly known as: NEURONTIN Take 3 capsules (300 mg total) by mouth 3 (three) times daily.   guaiFENesin 600 MG 12 hr tablet Commonly known as: Mucinex Take 1 tablet (600 mg total) by mouth 2 (two) times daily.   hydrOXYzine 50 MG capsule Commonly known as: VISTARIL Take 1 capsule (50 mg total) by mouth 3 (three) times daily as needed for itching.   insulin aspart 100 UNIT/ML FlexPen Commonly known as: NOVOLOG Inject 10 units with Lunch and 15 units with Dinner What changed:  how much to take when to take this additional instructions   ipratropium-albuterol 0.5-2.5 (3) MG/3ML Soln Commonly known as: DUONEB Take 3 mLs by nebulization 3 (three) times daily as needed. What changed: reasons to take this   levothyroxine 300 MCG  tablet Commonly known as: SYNTHROID Take 1 tablet by mouth once daily   lisinopril 5 MG tablet Commonly known as: ZESTRIL Take 1 tablet by mouth once daily   LORazepam 1 MG tablet Commonly known as: ATIVAN TAKE 1 TABLET BY MOUTH EVERY 8 HOURS AS NEEDED FOR ANXIETY   metFORMIN 500 MG 24 hr tablet Commonly known as: GLUCOPHAGE-XR Take 2 tablets (1,000 mg total) by mouth 2 (two) times daily with a meal.   metoprolol succinate 25 MG 24 hr tablet Commonly known as: TOPROL-XL Take 1 tablet by mouth once daily   mirtazapine 30 MG tablet Commonly known as: REMERON Take 1 tablet (30 mg total) by mouth daily.   predniSONE 10 MG tablet Commonly known as: DELTASONE Take 2 tablets (20 mg total) by mouth daily for 3 days. What changed: how much to take   rosuvastatin 10 MG tablet Commonly known as: Crestor Take 1 tablet (10 mg total) by mouth daily.   Trulicity 1.94 RD/4.0CX Sopn Generic drug: Dulaglutide Inject 0.75 mg into the skin once a week.   Vitamin D (Ergocalciferol) 1.25 MG (50000 UNIT) Caps capsule Commonly known as: DRISDOL Take  1 capsule (50,000 Units total) by mouth every 7 (seven) days. Sunday        Discharge Instructions: Please refer to Patient Instructions section of EMR for full details.  Patient was counseled important signs and symptoms that should prompt return to medical care, changes in medications, dietary instructions, activity restrictions, and follow up appointments.   Follow-Up Appointments:  Follow-up Information     Lattie Haw, MD. Go on 05/02/2021.   Specialty: Family Medicine Why: Appointment at 1:30 pm, please arrive at least 15 minutes prior to your scheduled papointment time for a hospital follow up. Contact information: [address] [place] [address] [phone]                 Donney Dice, DO 04/28/2021, 11:28 AM PGY-2, [place]

## 2021-04-28 NOTE — Plan of Care (Signed)
°  Problem: Activity: Goal: Ability to tolerate increased activity will improve Outcome: Adequate for Discharge   Problem: Clinical Measurements: Goal: Ability to maintain a body temperature in the normal range will improve Outcome: Adequate for Discharge   Problem: Respiratory: Goal: Ability to maintain adequate ventilation will improve Outcome: Adequate for Discharge Goal: Ability to maintain a clear airway will improve Outcome: Adequate for Discharge   Problem: Education: Goal: Knowledge of General Education information will improve Description: Including pain rating scale, medication(s)/side effects and non-pharmacologic comfort measures Outcome: Adequate for Discharge   Problem: Clinical Measurements: Goal: Ability to maintain clinical measurements within normal limits will improve Outcome: Adequate for Discharge Goal: Will remain free from infection Outcome: Adequate for Discharge Goal: Diagnostic test results will improve Outcome: Adequate for Discharge Goal: Respiratory complications will improve Outcome: Adequate for Discharge Goal: Cardiovascular complication will be avoided Outcome: Adequate for Discharge   Problem: Activity: Goal: Risk for activity intolerance will decrease Outcome: Adequate for Discharge   Problem: Nutrition: Goal: Adequate nutrition will be maintained Outcome: Adequate for Discharge   Problem: Coping: Goal: Level of anxiety will decrease Outcome: Adequate for Discharge   Problem: Skin Integrity: Goal: Risk for impaired skin integrity will decrease Outcome: Adequate for Discharge   Problem: Safety: Goal: Ability to remain free from injury will improve Outcome: Adequate for Discharge   Problem: Pain Managment: Goal: General experience of comfort will improve Outcome: Adequate for Discharge

## 2021-04-28 NOTE — Discharge Instructions (Addendum)
You were hospitalized at [facility] due to shortness of breath.  We expect this is from pneumonia and an asthma exacerbation which improved after medications, oxygen and monitoring.  We are so glad you are feeling better.  Be sure to follow-up with your regularly scheduled appointments.  Please go to your hospital follow up on 2/23 at 1:30 pm. Please take all your prescribed medications.Thank you for allowing [name] to be a part of your medical care.  Take care, [facility] family medicine team

## 2021-04-30 LAB — CULTURE, BLOOD (ROUTINE X 2)
Culture: NO GROWTH
Culture: NO GROWTH
Special Requests: ADEQUATE

## 2021-05-02 ENCOUNTER — Encounter: Payer: Self-pay | Admitting: Family Medicine

## 2021-05-02 ENCOUNTER — Other Ambulatory Visit: Payer: Self-pay

## 2021-05-02 ENCOUNTER — Telehealth (HOSPITAL_COMMUNITY): Payer: BC Managed Care – PPO | Admitting: Psychiatry

## 2021-05-02 ENCOUNTER — Other Ambulatory Visit (HOSPITAL_COMMUNITY): Payer: Self-pay

## 2021-05-02 ENCOUNTER — Other Ambulatory Visit: Payer: Self-pay | Admitting: Family Medicine

## 2021-05-02 ENCOUNTER — Ambulatory Visit (INDEPENDENT_AMBULATORY_CARE_PROVIDER_SITE_OTHER): Payer: BC Managed Care – PPO | Admitting: Family Medicine

## 2021-05-02 VITALS — BP 144/101 | HR 100 | Ht 71.0 in | Wt 387.2 lb

## 2021-05-02 DIAGNOSIS — J45909 Unspecified asthma, uncomplicated: Secondary | ICD-10-CM | POA: Diagnosis not present

## 2021-05-02 DIAGNOSIS — N1831 Chronic kidney disease, stage 3a: Secondary | ICD-10-CM | POA: Diagnosis not present

## 2021-05-02 DIAGNOSIS — I272 Pulmonary hypertension, unspecified: Secondary | ICD-10-CM | POA: Diagnosis not present

## 2021-05-02 DIAGNOSIS — E119 Type 2 diabetes mellitus without complications: Secondary | ICD-10-CM | POA: Diagnosis not present

## 2021-05-02 DIAGNOSIS — F411 Generalized anxiety disorder: Secondary | ICD-10-CM

## 2021-05-02 MED ORDER — CEFDINIR 300 MG PO CAPS
300.0000 mg | ORAL_CAPSULE | Freq: Two times a day (BID) | ORAL | 0 refills | Status: AC
Start: 2021-05-02 — End: 2021-05-06
  Filled 2021-05-02: qty 7, 4d supply, fill #0

## 2021-05-02 NOTE — Progress Notes (Signed)
° ° ° °  SUBJECTIVE:   CHIEF COMPLAINT / HPI:   [name] is a 51 y.o. female presents for hospital follow up   Multifocal pneumonia Asthma exacerbation Hospital admission from 2/16-2/19 for sepsis 2/2 multifocal PNA and asthma exacerbation. S/p CTA and Azithromycin for CAP and prednisone for 3 days ago.  Did not get to take  Cefdinr 300mg  BID for 7 days as [facility] did not have it. Received nebulizers, Solumedrol, racemic epi and magnesium in the hospital. Feels alittle improvement compared to discharge. discharged 4 days ago. She is feeling achy all over, chills and finds it hard to breath. Has taken albuterol and duonebs every few hrs. Denies chest pain.   [facility] [place] Office Visit from 04/19/2021 in [facility]  PHQ-9 Total Score 0       PERTINENT  PMH / PSH: asthma, iron deficiency anemia, hypothyroidism, depression, anxiety, CKD, morbid obesity  OBJECTIVE:   BP (!) 144/101    Pulse 100    Ht 5\' 11"  (1.803 m)    Wt (!) 387 lb 3.2 oz (175.6 kg)    SpO2 97%    BMI 54.00 kg/m    General: Alert, moderate respiratory distress Cardio: Normal S1 and S2, RRR, no r/m/g Pulm: Increased work of breathing, wheeze bilaterally Extremities: No peripheral edema.  Neuro: Cranial nerves grossly intact   No desats on ambulation with pulse ox. ASSESSMENT/PLAN:   Asthma Mildly improved compared to hospital discharge however still has trouble some breathing and wheezing today.  No desats on ambulation with pulse ox and oxygen remained above 94%.  Recommended she gets the cefdinir from the pharmacy today, continues as needed albuterol and DuoNebs along with all her other medications. Follow-up with me on 2/28.  Referred to pulmonology.  Provided patient with work note.  Strict ER precautions given to patient.  CKD (chronic kidney disease) stage 3, GFR 30-59 ml/min (HCC) Pt left before able to obtain BMP. She said she will come back next week to get it.     Lattie Haw,  MD PGY-3 [place]

## 2021-05-02 NOTE — Assessment & Plan Note (Addendum)
Pt left before able to obtain BMP. She said she will come back next week to get it.

## 2021-05-02 NOTE — Assessment & Plan Note (Signed)
Mildly improved compared to hospital discharge however still has trouble some breathing and wheezing today.  No desats on ambulation with pulse ox and oxygen remained above 94%.  Recommended she gets the cefdinir from the pharmacy today, continues as needed albuterol and DuoNebs along with all her other medications. Follow-up with me on 2/28.  Referred to pulmonology.  Provided patient with work note.  Strict ER precautions given to patient.

## 2021-05-02 NOTE — Patient Instructions (Signed)
Thank you for coming to see me today. It was a pleasure. Today we discussed your difficulty breathing following your pneumonia and asthma exacerbation.  Given that you are still wheezing your oxygen saturations are still above 94% even when walking.  I recommend cefdinir for 7 days we have sent it to [place]  Also continue albuterol as needed and DuoNebs.  If your symptoms get worse or you get worsening difficulty breathing, fevers, chills, chest pain then please go back to the ED.  Do not go to work until you feel better we will provide you with a work note for 1 week.  Please follow-up with me next week  If you have any questions or concerns, please do not hesitate to call the office at [phone].  Best wishes,   Dr Posey Pronto

## 2021-05-07 ENCOUNTER — Ambulatory Visit: Payer: BC Managed Care – PPO | Admitting: Family Medicine

## 2021-05-08 ENCOUNTER — Telehealth (INDEPENDENT_AMBULATORY_CARE_PROVIDER_SITE_OTHER): Payer: BC Managed Care – PPO | Admitting: Psychiatry

## 2021-05-08 DIAGNOSIS — F32A Depression, unspecified: Secondary | ICD-10-CM | POA: Diagnosis not present

## 2021-05-08 DIAGNOSIS — F411 Generalized anxiety disorder: Secondary | ICD-10-CM

## 2021-05-08 MED ORDER — MIRTAZAPINE 30 MG PO TABS: 30.0000 mg | ORAL_TABLET | Freq: Every day | ORAL | 3 refills | Status: DC

## 2021-05-08 MED ORDER — HYDROXYZINE PAMOATE 50 MG PO CAPS: 50.0000 mg | ORAL_CAPSULE | Freq: Three times a day (TID) | ORAL | 3 refills | Status: DC | PRN

## 2021-05-08 MED ORDER — FLUOXETINE HCL 40 MG PO CAPS: 80.0000 mg | ORAL_CAPSULE | Freq: Every day | ORAL | 3 refills | Status: DC

## 2021-05-08 NOTE — Progress Notes (Signed)
[facility] MD/PA/NP OP Progress Note  05/08/2021 9:27 PM [name]  MRN:  [id number]  Chief Complaint: No chief complaint on file. Virtual Visit via Telephone Note  I connected with [name] on 05/08/21 at  3:30 PM EST by telephone and verified that I am speaking with the correct person using two identifiers.  Location: Patient: home Provider: off site   I discussed the limitations, risks, security and privacy concerns of performing an evaluation and management service by telephone and the availability of in person appointments. I also discussed with the patient that there may be a patient responsible charge related to this service. The patient expressed understanding and agreed to proceed.    I discussed the assessment and treatment plan with the patient. The patient was provided an opportunity to ask questions and all were answered. The patient agreed with the plan and demonstrated an understanding of the instructions.   The patient was advised to call back or seek an in-person evaluation if the symptoms worsen or if the condition fails to improve as anticipated.  I provided 10 minutes of non-face-to-face time during this encounter.   Samantha Grip, NP   HPI: [name] is a 51 year old female presenting to [facility] behavioral health outpatient for a follow-up psychiatric evaluation.  She has a psychiatric history of major depressive disorder and generalized anxiety disorder.  Her symptoms are managed with Prozac 80 mg daily, hydroxyzine 50 mg 3 times daily as needed for anxiety, and mirtazapine 30 mg daily.  Patient reports medication compliance and denies adverse medication effects.  Patient reports satisfaction with her current medication regiment and believes medications are effective for symptom management.  She denies the need for a dose adjustment today. Patient is alert and oriented x4, calm, pleasant and willing to engage.  She reports a good mood.  Patient reports a  recent diagnosis of pneumonia which caused her to be hospitalized last week.  Patient reports being discharged on Sunday and stand for a hospital duration of 4 days.  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  Patient reports a good appetite, stating that she drinks Slim fast for breakfast as a weight loss measure.  Patient reports losing weight recently.  She reports good sleep.  Patient denies suicidal homicidal ideations, paranoia, delusional thought, or visual or auditory hallucinations.  Visit Diagnosis:    ICD-10-CM   1. Generalized anxiety disorder  F41.1 FLUoxetine (PROZAC) 40 MG capsule    hydrOXYzine (VISTARIL) 50 MG capsule    2. Mild depression  F32.A FLUoxetine (PROZAC) 40 MG capsule    mirtazapine (REMERON) 30 MG tablet      Past Psychiatric History: See below  Past Medical History:  Past Medical History:  Diagnosis Date   Acute renal failure ([place]) 05/27/10   hemodialysis for 6 weeks   Anxiety    Asthma    Depression    Diabetes mellitus    Hypertension    Rhabdomyolysis 06/06/10   after drug overdose   Suicide attempt by drug ingestion ([place]) 06/05/10   result rhabdomyolosis and ARF requrining dialysis     Past Surgical History:  Procedure Laterality Date   ANKLE ARTHROSCOPY Right 08/17/2015   Procedure: RIGHT ANKLE ARTHROSCOPY WITH SYNOVECTOMY AND LOOSE BODY EXCISION;  Surgeon: Melrose Nakayama, MD;  Location: [facility];  Service: Orthopedics;  Laterality: Right;   [place]  Family Psychiatric History: None known  Family History:  Family History  Problem Relation Age of Onset   Asthma Father     Social History:  Social History   Socioeconomic History   Marital status: Married    Spouse name: Not on file   Number of children: 2   Years of education: Not on file   Highest education level: Not on file  Occupational History   Occupation:  Corporate treasurer  Tobacco Use   Smoking status: Never   Smokeless tobacco: Never  Vaping Use   Vaping Use: Never used  Substance and Sexual Activity   Alcohol use: No   Drug use: No   Sexual activity: Yes    Comment: with husband   Other Topics Concern   Not on file  Social History Narrative   Married high school boyfriend at age 62, two boys, still married but he has been living with other women for years.   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  Nephro recommended avoiding use.   Haldol [Haloperidol Lactate] Other (See Comments)    Tardive dyskinesia   Nsaids Other (See Comments)    Nephro recommended avoiding after acute renal failure requiring hemodialysis associated with a suicide attempt.   Latex Other (See Comments)    Rash around IV site    Metabolic Disorder Labs: Lab Results  Component Value Date   HGBA1C 11.0 (A) 04/15/2021   MPG 243.17 11/14/2017   MPG 186 05/09/2015   No results found for: PROLACTIN Lab Results  Component Value Date   CHOL 201 (H) 04/15/2021   TRIG 231 (H) 04/15/2021   HDL 49 04/15/2021   CHOLHDL 4.1 04/15/2021   VLDL 46 (H) 04/10/2015   LDLCALC 112 (H) 04/15/2021   LDLCALC 125 (H) 12/19/2020   Lab Results  Component Value Date   TSH 4.640 (H) 04/15/2021   TSH 6.150 (H) 12/19/2020    Therapeutic Level Labs: No results found for: LITHIUM No results found for: VALPROATE No components found for:  CBMZ  Current Medications: Current Outpatient Medications  Medication Sig Dispense  Refill   albuterol (VENTOLIN HFA) 108 (90 Base) MCG/ACT inhaler Inhale 2 puffs into the lungs every 4 (four) hours as needed for wheezing or shortness of breath. 18 g 0   budesonide-formoterol (SYMBICORT) 80-4.5 MCG/ACT inhaler Inhale 2 puffs into the lungs in the morning and at bedtime. 1 each 12   Dulaglutide (TRULICITY) 2.77 OE/4.2PN SOPN Inject 0.75 mg into the skin once a week. 0.5 mL 0   famotidine (PEPCID) 20 MG tablet Take 20 mg by mouth 2 (two) times daily.     FLUoxetine (PROZAC) 40 MG capsule Take 2 capsules (80 mg total) by mouth daily. 60 capsule 3   fluticasone (FLONASE) 50 MCG/ACT nasal spray Place 1 spray into both nostrils daily. 1 spray in each nostril every day (Patient not taking: Reported on 04/25/2021) 16 g 1   fluticasone furoate-vilanterol (BREO ELLIPTA) 200-25 MCG/ACT AEPB Inhale 1  puff into the lungs daily. 1 each 0   gabapentin (NEURONTIN) 100 MG capsule Take 3 capsules (300 mg total) by mouth 3 (three) times daily. 90 capsule 3   guaiFENesin (MUCINEX) 600 MG 12 hr tablet Take 1 tablet (600 mg total) by mouth 2 (two) times daily. 30 tablet 0   hydrOXYzine (VISTARIL) 50 MG capsule Take 1 capsule (50 mg total) by mouth 3 (three) times daily as needed for itching. 90 capsule 3   insulin aspart (NOVOLOG) 100 UNIT/ML FlexPen Inject 10 units with Lunch and 15 units with Dinner (Patient taking differently: 15-20 Units in the morning and at bedtime. Inject 15 units with Lunch and 20 units with Dinner) 15 mL 11   Insulin Glargine (BASAGLAR KWIKPEN) 100 UNIT/ML Inject 65 Units into the skin 2 (two) times daily. Take 65U twice a day 15 mL 6   ipratropium-albuterol (DUONEB) 0.5-2.5 (3) MG/3ML SOLN Take 3 mLs by nebulization 3 (three) times daily as needed. (Patient taking differently: Take 3 mLs by nebulization 3 (three) times daily as needed (sob/wheezing).) 360 mL 0   levothyroxine (SYNTHROID) 300 MCG tablet Take 1 tablet by mouth once daily 90 tablet 0   lisinopril (ZESTRIL) 5 MG  tablet Take 1 tablet by mouth once daily 90 tablet 3   LORazepam (ATIVAN) 1 MG tablet TAKE 1 TABLET BY MOUTH EVERY 8 HOURS AS NEEDED FOR ANXIETY 45 tablet 0   metFORMIN (GLUCOPHAGE-XR) 500 MG 24 hr tablet Take 2 tablets (1,000 mg total) by mouth 2 (two) times daily with a meal. 120 tablet 11   metoprolol succinate (TOPROL-XL) 25 MG 24 hr tablet Take 1 tablet by mouth once daily 90 tablet 3   mirtazapine (REMERON) 30 MG tablet Take 1 tablet (30 mg total) by mouth daily. 30 tablet 3   rosuvastatin (CRESTOR) 10 MG tablet Take 1 tablet (10 mg total) by mouth daily. 90 tablet 3   Vitamin D, Ergocalciferol, (DRISDOL) 50000 units CAPS capsule Take 1 capsule (50,000 Units total) by mouth every 7 (seven) days. Sunday (Patient not taking: Reported on 12/18/2020) 12 capsule 0   No current facility-administered medications for this visit.     Musculoskeletal: Strength & Muscle Tone: N/A Gait & Station: N/A Patient leans: N/A  Psychiatric Specialty Exam: Review of Systems  Psychiatric/Behavioral:  Negative for hallucinations, self-injury and suicidal ideas.   All other systems reviewed and are negative.  There were no vitals taken for this visit.There is no height or weight on file to calculate BMI.  General Appearance: N/A  Eye Contact: N/A  Speech:  Clear and Coherent  Volume:  Normal  Mood:  Euthymic  Affect:  NA  Thought Process:  Coherent  Orientation:  Full (Time, Place, and Person)  Thought Content: Logical   Suicidal Thoughts:  No  Homicidal Thoughts:  No  Memory: Good  Judgement: Good  Insight: Good  Psychomotor Activity: N/A  Concentration: Good  Recall: Good  Fund of Knowledge: Good  Language: Good  Akathisia: N/A  Handed: Right  AIMS (if indicated): N/A  Assets:  Communication Skills Desire for Improvement Financial Resources/Insurance Housing  ADL's:  Intact  Cognition: WNL  Sleep:  Good   Screenings: GAD-7    Flowsheet Row Video Visit from 08/30/2020 in [facility] Video Visit from 06/07/2020 in [facility]  Total GAD-7 Score 2 21      PHQ2-9    [facility] Office Visit from 04/19/2021 in [facility] Office Visit from 04/15/2021 in [facility] Office Visit from 03/28/2021 in [place] Office Visit from 11/23/2020 in [facility] Office Visit from 10/01/2020 in [facility]  PHQ-2 Total Score 0 0 0 0 0  PHQ-9 Total Score 0 2 1 1  0      Flowsheet Row ED to Hosp-Admission (Discharged) from 04/25/2021 in [facility] ED from 04/23/2021 in [facility] from 11/23/2020 in [facility] No Risk No Risk No Risk        Assessment and Plan: [name] is a 51 year old female presenting to [facility] behavioral health outpatient for a follow-up psychiatric evaluation.  She has a psychiatric history of bipolar disorder, major depressive disorder, and generalized anxiety disorder.  Her symptoms are managed with Prozac 80 mg daily, hydroxyzine 50 mg 3 times daily as needed for anxiety, and mirtazapine 30 mg daily.  Patient reports medication compliance and denies adverse medication effects.  Patient tolerating medications well with no history or reports of serotonin syndrome symptoms. Patient reports satisfaction with her current medication regiment and believes medications are effective for symptom management.  She denies the need for a dose adjustment today.  Medication risk versus benefits discussed.  Collaboration of Care: Collaboration of Care: Medication Management AEB medications E scribed to patient's preferred pharmacy.  1. Generalized anxiety disorder  - FLUoxetine (PROZAC) 40 MG capsule; Take 2 capsules (80 mg total) by mouth daily.  Dispense: 60 capsule; Refill: 3 - hydrOXYzine  (VISTARIL) 50 MG capsule; Take 1 capsule (50 mg total) by mouth 3 (three) times daily as needed for itching.  Dispense: 90 capsule; Refill: 3  2. Mild depression  - FLUoxetine (PROZAC) 40 MG capsule; Take 2 capsules (80 mg total) by mouth daily.  Dispense: 60 capsule; Refill: 3 - mirtazapine (REMERON) 30 MG tablet; Take 1 tablet (30 mg total) by mouth daily.  Dispense: 30 tablet; Refill: 3    Return to care in 3 months  Patient/Guardian was advised Release of Information must be obtained prior to any record release in order to collaborate their care with an outside provider. Patient/Guardian was advised if they have not already done so to contact the registration department to sign all necessary forms in order for [name] to release information regarding their care.   Consent: Patient/Guardian gives verbal consent for treatment and assignment of benefits for services provided during this visit. Patient/Guardian expressed understanding and agreed to proceed.    Samantha Grip, NP 05/08/2021, 9:27 PM

## 2021-05-17 ENCOUNTER — Telehealth: Payer: Self-pay | Admitting: Family Medicine

## 2021-05-17 NOTE — Telephone Encounter (Signed)
**  After Hours/ Emergency Line Call** ? ?Received a call to report that [name] nasal congestion and dizziness spells when standing.  No cough or fevers. Intermittent sweating and hot flashes.  Blood sugars at 350.  Normal blood BP at home 127/70. Appetite has decreased but hydrating well.  Has had to  increase breathing treatment this week.  Recent admission for pneumonia and asthma exacerbation 3 weeks ago.  Had completed course of antibiotics and steroids.  Has been back to work since then. No recent sick contact.  Denying chest pain or pressure.  Recommended that she be seen in ED or Urgent Care if worsening shortness of breath.  Red flags discussed.  Will forward to PCP. ? ?Carollee Leitz, MD ?PGY-2, [facility] ?05/17/2021 8:28 PM   ? ? ?

## 2021-05-19 ENCOUNTER — Inpatient Hospital Stay (HOSPITAL_COMMUNITY): Payer: BC Managed Care – PPO

## 2021-05-19 ENCOUNTER — Emergency Department (HOSPITAL_COMMUNITY): Payer: BC Managed Care – PPO

## 2021-05-19 ENCOUNTER — Ambulatory Visit (HOSPITAL_COMMUNITY)
Admission: EM | Admit: 2021-05-19 | Discharge: 2021-05-19 | Disposition: A | Discharge status: Nursing Home (Includes ICF) | Payer: BC Managed Care – PPO | Attending: Nurse Practitioner | Admitting: Nurse Practitioner

## 2021-05-19 ENCOUNTER — Inpatient Hospital Stay (HOSPITAL_COMMUNITY)
Admission: EM | Admit: 2021-05-19 | Discharge: 2021-05-21 | DRG: 291 | Disposition: A | Discharge status: Home / Self Care | Payer: BC Managed Care – PPO | Attending: Family Medicine | Admitting: Family Medicine

## 2021-05-19 ENCOUNTER — Other Ambulatory Visit: Payer: Self-pay

## 2021-05-19 DIAGNOSIS — E785 Hyperlipidemia, unspecified: Secondary | ICD-10-CM | POA: Diagnosis present

## 2021-05-19 DIAGNOSIS — N179 Acute kidney failure, unspecified: Secondary | ICD-10-CM | POA: Diagnosis present

## 2021-05-19 DIAGNOSIS — I471 Supraventricular tachycardia: Secondary | ICD-10-CM | POA: Diagnosis present

## 2021-05-19 DIAGNOSIS — F411 Generalized anxiety disorder: Secondary | ICD-10-CM | POA: Diagnosis present

## 2021-05-19 DIAGNOSIS — I130 Hypertensive heart and chronic kidney disease with heart failure and stage 1 through stage 4 chronic kidney disease, or unspecified chronic kidney disease: Principal | ICD-10-CM | POA: Diagnosis present

## 2021-05-19 DIAGNOSIS — Z7951 Long term (current) use of inhaled steroids: Secondary | ICD-10-CM

## 2021-05-19 DIAGNOSIS — E1169 Type 2 diabetes mellitus with other specified complication: Secondary | ICD-10-CM

## 2021-05-19 DIAGNOSIS — R0602 Shortness of breath: Secondary | ICD-10-CM | POA: Diagnosis not present

## 2021-05-19 DIAGNOSIS — N183 Chronic kidney disease, stage 3 unspecified: Secondary | ICD-10-CM | POA: Diagnosis not present

## 2021-05-19 DIAGNOSIS — E039 Hypothyroidism, unspecified: Secondary | ICD-10-CM | POA: Diagnosis present

## 2021-05-19 DIAGNOSIS — E875 Hyperkalemia: Secondary | ICD-10-CM | POA: Diagnosis present

## 2021-05-19 DIAGNOSIS — Z6841 Body Mass Index (BMI) 40.0 and over, adult: Secondary | ICD-10-CM

## 2021-05-19 DIAGNOSIS — Z9151 Personal history of suicidal behavior: Secondary | ICD-10-CM | POA: Diagnosis not present

## 2021-05-19 DIAGNOSIS — D509 Iron deficiency anemia, unspecified: Secondary | ICD-10-CM | POA: Diagnosis present

## 2021-05-19 DIAGNOSIS — I509 Heart failure, unspecified: Secondary | ICD-10-CM | POA: Diagnosis not present

## 2021-05-19 DIAGNOSIS — E1165 Type 2 diabetes mellitus with hyperglycemia: Secondary | ICD-10-CM | POA: Diagnosis present

## 2021-05-19 DIAGNOSIS — E559 Vitamin D deficiency, unspecified: Secondary | ICD-10-CM | POA: Diagnosis present

## 2021-05-19 DIAGNOSIS — Z79890 Hormone replacement therapy (postmenopausal): Secondary | ICD-10-CM

## 2021-05-19 DIAGNOSIS — J4550 Severe persistent asthma, uncomplicated: Secondary | ICD-10-CM | POA: Diagnosis present

## 2021-05-19 DIAGNOSIS — R0603 Acute respiratory distress: Secondary | ICD-10-CM

## 2021-05-19 DIAGNOSIS — Z20822 Contact with and (suspected) exposure to covid-19: Secondary | ICD-10-CM | POA: Diagnosis present

## 2021-05-19 DIAGNOSIS — N1831 Chronic kidney disease, stage 3a: Secondary | ICD-10-CM | POA: Diagnosis present

## 2021-05-19 DIAGNOSIS — I5021 Acute systolic (congestive) heart failure: Secondary | ICD-10-CM | POA: Diagnosis present

## 2021-05-19 DIAGNOSIS — R1011 Right upper quadrant pain: Secondary | ICD-10-CM | POA: Diagnosis not present

## 2021-05-19 DIAGNOSIS — Z79899 Other long term (current) drug therapy: Secondary | ICD-10-CM

## 2021-05-19 DIAGNOSIS — F319 Bipolar disorder, unspecified: Secondary | ICD-10-CM | POA: Diagnosis present

## 2021-05-19 DIAGNOSIS — Z794 Long term (current) use of insulin: Secondary | ICD-10-CM

## 2021-05-19 DIAGNOSIS — T380X5A Adverse effect of glucocorticoids and synthetic analogues, initial encounter: Secondary | ICD-10-CM | POA: Diagnosis present

## 2021-05-19 DIAGNOSIS — R0609 Other forms of dyspnea: Secondary | ICD-10-CM | POA: Diagnosis not present

## 2021-05-19 DIAGNOSIS — R0600 Dyspnea, unspecified: Secondary | ICD-10-CM | POA: Diagnosis present

## 2021-05-19 DIAGNOSIS — E1122 Type 2 diabetes mellitus with diabetic chronic kidney disease: Secondary | ICD-10-CM | POA: Diagnosis present

## 2021-05-19 DIAGNOSIS — J4541 Moderate persistent asthma with (acute) exacerbation: Secondary | ICD-10-CM

## 2021-05-19 DIAGNOSIS — N289 Disorder of kidney and ureter, unspecified: Secondary | ICD-10-CM

## 2021-05-19 DIAGNOSIS — M79605 Pain in left leg: Secondary | ICD-10-CM | POA: Diagnosis not present

## 2021-05-19 DIAGNOSIS — I5023 Acute on chronic systolic (congestive) heart failure: Secondary | ICD-10-CM | POA: Diagnosis present

## 2021-05-19 DIAGNOSIS — Z7984 Long term (current) use of oral hypoglycemic drugs: Secondary | ICD-10-CM | POA: Diagnosis not present

## 2021-05-19 DIAGNOSIS — I5022 Chronic systolic (congestive) heart failure: Secondary | ICD-10-CM

## 2021-05-19 LAB — COMPREHENSIVE METABOLIC PANEL
ALT: 29 U/L (ref 0–44)
AST: 21 U/L (ref 15–41)
Albumin: 3.1 g/dL — ABNORMAL LOW (ref 3.5–5.0)
Alkaline Phosphatase: 56 U/L (ref 38–126)
Anion gap: 15 (ref 5–15)
BUN: 38 mg/dL — ABNORMAL HIGH (ref 6–20)
CO2: 16 mmol/L — ABNORMAL LOW (ref 22–32)
Calcium: 8.5 mg/dL — ABNORMAL LOW (ref 8.9–10.3)
Chloride: 104 mmol/L (ref 98–111)
Creatinine, Ser: 2.56 mg/dL — ABNORMAL HIGH (ref 0.44–1.00)
GFR, Estimated: 22 mL/min — ABNORMAL LOW (ref >60)
Glucose, Bld: 391 mg/dL — ABNORMAL HIGH (ref 70–99)
Potassium: 4.6 mmol/L (ref 3.5–5.1)
Sodium: 135 mmol/L (ref 135–145)
Total Bilirubin: 0.7 mg/dL (ref 0.3–1.2)
Total Protein: 6.3 g/dL — ABNORMAL LOW (ref 6.5–8.1)

## 2021-05-19 LAB — MAGNESIUM: Magnesium: 1.3 mg/dL — ABNORMAL LOW (ref 1.7–2.4)

## 2021-05-19 LAB — CBC WITH DIFFERENTIAL/PLATELET
Abs Immature Granulocytes: 0.03 10*3/uL (ref 0.00–0.07)
Basophils Absolute: 0 10*3/uL (ref 0.0–0.1)
Basophils Relative: 0 %
Eosinophils Absolute: 0 10*3/uL (ref 0.0–0.5)
Eosinophils Relative: 0 %
HCT: 29.2 % — ABNORMAL LOW (ref 36.0–46.0)
Hemoglobin: 8.6 g/dL — ABNORMAL LOW (ref 12.0–15.0)
Immature Granulocytes: 0 %
Lymphocytes Relative: 23 %
Lymphs Abs: 1.7 10*3/uL (ref 0.7–4.0)
MCH: 24.5 pg — ABNORMAL LOW (ref 26.0–34.0)
MCHC: 29.5 g/dL — ABNORMAL LOW (ref 30.0–36.0)
MCV: 83.2 fL (ref 80.0–100.0)
Monocytes Absolute: 0.7 10*3/uL (ref 0.1–1.0)
Monocytes Relative: 10 %
Neutro Abs: 5.1 10*3/uL (ref 1.7–7.7)
Neutrophils Relative %: 67 %
Platelets: 392 10*3/uL (ref 150–400)
RBC: 3.51 MIL/uL — ABNORMAL LOW (ref 3.87–5.11)
RDW: 16 % — ABNORMAL HIGH (ref 11.5–15.5)
WBC: 7.6 10*3/uL (ref 4.0–10.5)
nRBC: 0.3 % — ABNORMAL HIGH (ref 0.0–0.2)

## 2021-05-19 LAB — CBG MONITORING, ED: Glucose-Capillary: 351 mg/dL — ABNORMAL HIGH (ref 70–99)

## 2021-05-19 LAB — TSH: TSH: 2.689 u[IU]/mL (ref 0.350–4.500)

## 2021-05-19 LAB — RESP PANEL BY RT-PCR (FLU A&B, COVID) ARPGX2
Influenza A by PCR: NEGATIVE
Influenza B by PCR: NEGATIVE
SARS Coronavirus 2 by RT PCR: NEGATIVE

## 2021-05-19 LAB — BRAIN NATRIURETIC PEPTIDE: B Natriuretic Peptide: 720.6 pg/mL — ABNORMAL HIGH (ref 0.0–100.0)

## 2021-05-19 LAB — TROPONIN I (HIGH SENSITIVITY)
Troponin I (High Sensitivity): 17 ng/L (ref <18)
Troponin I (High Sensitivity): 18 ng/L — ABNORMAL HIGH (ref <18)

## 2021-05-19 MED ORDER — ROSUVASTATIN CALCIUM 5 MG PO TABS
10.0000 mg | ORAL_TABLET | Freq: Every day | ORAL | Status: DC
  Administered 2021-05-19 – 2021-05-21 (×3): 10 mg via ORAL
  Filled 2021-05-19 (×3): qty 2

## 2021-05-19 MED ORDER — FLUTICASONE FUROATE-VILANTEROL 100-25 MCG/ACT IN AEPB
1.0000 | INHALATION_SPRAY | Freq: Every day | RESPIRATORY_TRACT | Status: DC
  Administered 2021-05-20 – 2021-05-21 (×2): 1 via RESPIRATORY_TRACT
  Filled 2021-05-19: qty 28

## 2021-05-19 MED ORDER — FAMOTIDINE 20 MG PO TABS
20.0000 mg | ORAL_TABLET | Freq: Every day | ORAL | Status: DC
  Administered 2021-05-19 – 2021-05-21 (×3): 20 mg via ORAL
  Filled 2021-05-19 (×3): qty 1

## 2021-05-19 MED ORDER — METOPROLOL SUCCINATE ER 25 MG PO TB24
25.0000 mg | ORAL_TABLET | Freq: Every day | ORAL | Status: DC
  Administered 2021-05-19 – 2021-05-20 (×2): 25 mg via ORAL
  Filled 2021-05-19 (×2): qty 1

## 2021-05-19 MED ORDER — FLUOXETINE HCL 20 MG PO CAPS
80.0000 mg | ORAL_CAPSULE | Freq: Every day | ORAL | Status: DC
  Administered 2021-05-19 – 2021-05-21 (×3): 80 mg via ORAL
  Filled 2021-05-19 (×3): qty 4

## 2021-05-19 MED ORDER — CALCIUM CARBONATE ANTACID 500 MG PO CHEW
2.0000 | CHEWABLE_TABLET | Freq: Three times a day (TID) | ORAL | Status: DC | PRN
  Administered 2021-05-19 – 2021-05-20 (×3): 400 mg via ORAL
  Filled 2021-05-19 (×3): qty 2

## 2021-05-19 MED ORDER — ADENOSINE 6 MG/2ML IV SOLN: 6.0000 mg | Freq: Once | INTRAVENOUS | Status: DC

## 2021-05-19 MED ORDER — FUROSEMIDE 10 MG/ML IJ SOLN
20.0000 mg | Freq: Once | INTRAMUSCULAR | Status: AC
Start: 2021-05-19 — End: 2021-05-19
  Administered 2021-05-19: 20 mg via INTRAVENOUS
  Filled 2021-05-19: qty 2

## 2021-05-19 MED ORDER — LEVOTHYROXINE SODIUM 100 MCG PO TABS
300.0000 ug | ORAL_TABLET | Freq: Every day | ORAL | Status: DC
  Administered 2021-05-19 – 2021-05-21 (×3): 300 ug via ORAL
  Filled 2021-05-19 (×3): qty 3

## 2021-05-19 MED ORDER — ADENOSINE 6 MG/2ML IV SOLN
INTRAVENOUS | Status: AC
  Filled 2021-05-19: qty 2

## 2021-05-19 MED ORDER — ALBUTEROL SULFATE (2.5 MG/3ML) 0.083% IN NEBU
INHALATION_SOLUTION | RESPIRATORY_TRACT | Status: AC
  Filled 2021-05-19: qty 3

## 2021-05-19 MED ORDER — IPRATROPIUM-ALBUTEROL 0.5-2.5 (3) MG/3ML IN SOLN
3.0000 mL | Freq: Once | RESPIRATORY_TRACT | Status: AC
  Administered 2021-05-19: 3 mL via RESPIRATORY_TRACT

## 2021-05-19 MED ORDER — MIRTAZAPINE 15 MG PO TABS
30.0000 mg | ORAL_TABLET | Freq: Every day | ORAL | Status: DC
  Administered 2021-05-19 – 2021-05-21 (×3): 30 mg via ORAL
  Filled 2021-05-19: qty 1
  Filled 2021-05-19 (×2): qty 2

## 2021-05-19 MED ORDER — FLUTICASONE PROPIONATE 50 MCG/ACT NA SUSP
1.0000 | Freq: Every day | NASAL | Status: DC
  Administered 2021-05-20 – 2021-05-21 (×2): 1 via NASAL
  Filled 2021-05-19: qty 16

## 2021-05-19 MED ORDER — SODIUM CHLORIDE 0.9 % IV BOLUS
1000.0000 mL | Freq: Once | INTRAVENOUS | Status: AC
  Administered 2021-05-19: 1000 mL via INTRAVENOUS

## 2021-05-19 MED ORDER — IPRATROPIUM BROMIDE 0.02 % IN SOLN: 0.5000 mg | Freq: Four times a day (QID) | RESPIRATORY_TRACT | Status: DC | PRN

## 2021-05-19 MED ORDER — GABAPENTIN 300 MG PO CAPS
300.0000 mg | ORAL_CAPSULE | Freq: Three times a day (TID) | ORAL | Status: DC
  Filled 2021-05-19 (×5): qty 1

## 2021-05-19 MED ORDER — ALBUTEROL SULFATE (2.5 MG/3ML) 0.083% IN NEBU
2.5000 mg | INHALATION_SOLUTION | Freq: Once | RESPIRATORY_TRACT | Status: AC
  Administered 2021-05-19: 2.5 mg via RESPIRATORY_TRACT

## 2021-05-19 MED ORDER — METHYLPREDNISOLONE SODIUM SUCC 125 MG IJ SOLR
125.0000 mg | Freq: Once | INTRAMUSCULAR | Status: AC
  Administered 2021-05-19: 125 mg via INTRAVENOUS
  Filled 2021-05-19: qty 2

## 2021-05-19 MED ORDER — IPRATROPIUM-ALBUTEROL 0.5-2.5 (3) MG/3ML IN SOLN: 3.0000 mL | RESPIRATORY_TRACT | Status: DC | PRN

## 2021-05-19 MED ORDER — ALBUTEROL SULFATE (2.5 MG/3ML) 0.083% IN NEBU: 2.5000 mg | INHALATION_SOLUTION | Freq: Four times a day (QID) | RESPIRATORY_TRACT | Status: DC | PRN

## 2021-05-19 MED ORDER — MAGNESIUM SULFATE 4 GM/100ML IV SOLN
4.0000 g | Freq: Once | INTRAVENOUS | Status: AC
  Administered 2021-05-19: 4 g via INTRAVENOUS
  Filled 2021-05-19: qty 100

## 2021-05-19 MED ORDER — LEVALBUTEROL HCL 0.63 MG/3ML IN NEBU
0.6300 mg | INHALATION_SOLUTION | Freq: Four times a day (QID) | RESPIRATORY_TRACT | Status: DC | PRN
  Administered 2021-05-20: 0.63 mg via RESPIRATORY_TRACT
  Filled 2021-05-19: qty 3

## 2021-05-19 MED ORDER — ENOXAPARIN SODIUM 40 MG/0.4ML IJ SOSY
40.0000 mg | PREFILLED_SYRINGE | INTRAMUSCULAR | Status: DC
  Administered 2021-05-19: 40 mg via SUBCUTANEOUS
  Filled 2021-05-19: qty 0.4

## 2021-05-19 MED ORDER — INSULIN GLARGINE-YFGN 100 UNIT/ML ~~LOC~~ SOLN
60.0000 [IU] | Freq: Two times a day (BID) | SUBCUTANEOUS | Status: DC
  Administered 2021-05-19 – 2021-05-20 (×2): 60 [IU] via SUBCUTANEOUS
  Filled 2021-05-19 (×3): qty 0.6

## 2021-05-19 MED ORDER — INSULIN GLARGINE-YFGN 100 UNIT/ML ~~LOC~~ SOLN
30.0000 [IU] | Freq: Two times a day (BID) | SUBCUTANEOUS | Status: DC
  Filled 2021-05-19 (×2): qty 0.3

## 2021-05-19 MED ORDER — INSULIN ASPART 100 UNIT/ML IJ SOLN
0.0000 [IU] | Freq: Three times a day (TID) | INTRAMUSCULAR | Status: DC
  Administered 2021-05-19: 20 [IU] via SUBCUTANEOUS
  Administered 2021-05-20 (×2): 4 [IU] via SUBCUTANEOUS
  Administered 2021-05-21: 3 [IU] via SUBCUTANEOUS

## 2021-05-19 MED ORDER — MAGNESIUM SULFATE 2 GM/50ML IV SOLN: 2.0000 g | Freq: Once | INTRAVENOUS | Status: DC

## 2021-05-19 NOTE — Progress Notes (Signed)
FPTS Interim Progress Note ? ?Was notified by nurse that patient was back in SVT, with rates in the 150s.  We notified cardiology and went down to see patient, she was breathing fine, but did feel like her "insides were shaky." Tried vagal maneuvers such as bearing down, and carotid massage.  Was getting ready to order an EKG, and give adenosine, but patient was able to revert back to sinus rhythm. She seemed to be in SVT for about 10-15 min. Will continue to monitor closely.  ? ?Shary Key, DO ?05/19/2021, 5:33 PM ?PGY-2, [place] ?Service pager [phone]  ?

## 2021-05-19 NOTE — Hospital Course (Addendum)
 [name] is a 51 y.o. female presenting with shortness of breath and reported episode of SVT in setting of recent admission for multifocal pneumonia and asthma exacerbation. PMH is significant for HTN, HLD, T2DM, CKD, hypothyroidism, IDA, asthma.  Acute systolic CHF Presented with shortness of breath and bilateral lower extremity edema. Initially tachycardia to low 100s and slightly tachypneic on room air but appeared comfortable s/p IV solumedrol x1 and NS bolus x1, with SpO2 >92%. CXR with pulmonary vascular congestion and elevated BNP 720 consistent with CHF exacerbation. Echo on 05/20/21 showed LVEF 34-40% with moderately decreased function and global left ventricle hypokinesis. She was further diuresed with IV lasix , then transitioned to oral furosemide  prior to discharge with improvement of symptoms. She was stable on room air.  New onset supraventricular tachycardia Pt with SVT with HR in 190's at urgent care before coming to ED and treated with IV adenosine .  In ED, pt went into SVT again which resolved with carotid massage and vagal maneuver. Cardiology was consulted and switched Toprol  XL to metoprolol  succinate BID. Likely etiology of SVT included electrolyte abnormalities, CHF exacerbation. She did not have any further episodes of SVT after admission to the floor and remained in NSR.  Hyperkalemia K+ elevated to 6.2 on day 2 of admission, treated with Lokelma  x2 and appropriately fell to normal range. EKG without peaked T waves or ST elevation. No cardiac symptoms.  AKI on CKD stage 3a Cr elevated to 2.56 on admission, BUN 38, GFR 22.  Baseline Cr appears 1.3-1.6. Cr elevated to 2.31 at time of discharge.  Anemia, hx IDA requiring blood transfusions and IV iron transfusions Hgb of 8.6 on admission, baseline of 9-10. Iron studies showed low iron 14, ferritin 10, TIBC 410. She was treated with 2 doses of IV ferrelcet. Hgb 9.5 at discharge. She was discharged with instructions to take  PO iron.  T2DM Pt was treated with SSI, semglee  65u twice daily and mealtime coverage 8u novolog .She was initially hyperglycemic to 400-500s, but decreased to 100-200s at time of discharge. She was discharged with home regimen of 65u lantus  BID, 15u novolog  BID with meals.  Other chronic conditions stable and treated with home medications  PCP follow-up items: Ensure outpatient cardiology follow-up. Toprol  XL was discontinued and she was started on Metoprolol  succinate BID and Bidil , per cardiology recommendations. Discuss discontinuing Remeron . Investigate financial barrier with Trulicity - patient reports not taking medication due to cost. Consider pharmacy appointment with Dr. Koval for further management of T2DM complexity Metformin  was held in light of AKI, resume as appropriate. Repeat BMP to ensure resolution of AKI vs. worsening CKD Recommend outpatient PFT for likely moderate to severe persistent asthma Recommend outpatient sleep study

## 2021-05-19 NOTE — Progress Notes (Addendum)
FPTS Brief Progress Note ? ?S:Pt was awake lying in bed with significant other at bed side. Stated she is feeling much better than earlier but complains of dry throat, acid reflux and epigastric tenderness. She has had this epigastric tenderness since this morning. She denied any nausea. Requests tums for acid reflux. ? ? ?O: ?BP (!) 142/100 (BP Location: Left Arm)   Pulse (!) 107   Temp 98.9 ?F (37.2 ?C) (Oral)   Resp 18   Ht 5\' 11"  (1.803 m)   Wt (!) 175.5 kg   LMP 04/21/2021   SpO2 98%   BMI 53.98 kg/m?   ?General: pt lying in bed, awake, talking, NAD ?Cardio: Regular rhythm, sounds mildly tachycardic, not yet hooked up to monitors in new room ?Lungs: limited d/t body habitus, CTAB in upper lobes ?Abdomen: bowel sounds present, soft, non distended, tenderness to palpation in epigastric area ? 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 She received once dose of IV lasix 20 mg earlier this evening.  ?-continue to follow plan outlined in H&P ? ?Epigastric tenderness ?RUQ US showed hepatic steatosis and s/p cholecystectomy. As pt is having symptoms of acid reflux in throat, epigastric tenderness could also be related.  ?-Tums 400mg  TID prn  ?-CTM ? ? ?- Orders reviewed. Labs for AM ordered, which was adjusted as needed.  ? ? ?Precious Gilding, DO ?05/19/2021, 9:49 PM ?PGY-1, [facility] Night Resident  ?Please page [phone] with questions.  ? ? ?

## 2021-05-19 NOTE — ED Triage Notes (Signed)
Per pt and spouse: pt has had SOB onset 2 days ago with worsening throughout the evening. ?

## 2021-05-19 NOTE — TOC Initial Note (Signed)
Transition of Care (TOC) - Initial/Assessment Note  ? ? ?Patient Details  ?Name: [name] ?MRN: [id number] ?Date of Birth: [birth year] ? ?Transition of Care (TOC) CM/SW Contact:    ?Verdell Carmine, RN ?Phone Number: ?05/19/2021, 4:03 PM ? ?Clinical Narrative:                 ? ?Transition of Care Department [facility]) has reviewed patient and no TOC needs have been identified at this time. We will continue to monitor patient advancement through interdisciplinary progression rounds. If new patient transition needs arise, please place a TOC consult. ?  ?  ?  ?  ? ? ?Patient Goals and CMS Choice ?  ?  ?  ? ?Expected Discharge Plan and Services ?  ?  ?  ?  ?  ?                ?  ?  ?  ?  ?  ?  ?  ?  ?  ?  ? ?Prior Living Arrangements/Services ?  ?  ?  ?       ?  ?  ?  ?  ? ?Activities of Daily Living ?  ?  ? ?Permission Sought/Granted ?  ?  ?   ?   ?   ?   ? ?Emotional Assessment ?  ?  ?  ?  ?  ?  ? ?Admission diagnosis:  svt  ?Patient Active Problem List  ? Diagnosis Date Noted  ? Multifocal pneumonia 04/25/2021  ? Severe sepsis ([place])   ? Healthcare maintenance 04/15/2021  ? Asthma 12/18/2020  ? Vaginal irritation 09/28/2020  ? Mild depression 08/30/2020  ? Arthritis of right ankle 07/25/2020  ? Right knee pain 06/21/2020  ? Sinus pain 03/01/2020  ? Bleeding nose 03/01/2020  ? Depression 11/21/2019  ? Moderate episode of recurrent major depressive disorder ([place]) 09/22/2019  ? Generalized anxiety disorder 09/22/2019  ? Bipolar I disorder, most recent episode depressed ([place]) 09/22/2019  ? Bilateral knee pain 03/14/2019  ? Osteoarthritis 11/24/2018  ? Bipolar disorder ([place]) 04/29/2017  ? HLD (hyperlipidemia) 03/19/2017  ? Asthma exacerbation 03/14/2017  ? Weight gain 12/06/2016  ? MDD (major depressive disorder), recurrent severe, without psychosis ([place]) 05/11/2015  ? Dyspnea 05/09/2015  ? Essential hypertension   ? Vitamin D deficiency 04/11/2015  ? Fatigue 04/10/2015  ? Non compliance w medication regimen 04/10/2015  ?  Thyroid activity decreased   ? Adjustment disorder with mixed anxiety and depressed mood   ? Cough 09/20/2014  ? Grief at loss of child 06/22/2014  ? Dysmenorrhea 11/25/2013  ? Suicidal ideation 05/27/2011  ? CKD (chronic kidney disease) stage 3, GFR 30-59 ml/min ([place]) 11/06/2010  ? ENDOMETRIAL HYPERPLASIA UNSPECIFIED 02/07/2008  ? MENORRHAGIA 01/03/2008  ? Allergic rhinitis 06/24/2007  ? Essential hypertension, benign 02/03/2007  ? Hypothyroidism 05/07/2006  ? Type II diabetes mellitus with complication ([place]) 17/61/6073  ? Morbid obesity ([place]) 05/07/2006  ? Iron deficiency anemia 05/07/2006  ? Anxiety 05/07/2006  ? OBSESSIVE COMPUL. DISORDER 05/07/2006  ? ?PCP:  [name], DO ?Pharmacy:   ?[place], [place] - [address] ?[place] ?[place] [address] ?Phone: [phone] Fax: [phone] ? ?[id number] - [place], [address] [place] ?[address] ?[place] [address] ?Phone: [phone] Fax: [phone] ? ?[place] - [address] ?  [address] ?[place] [address] ?Phone: [phone] Fax: [phone] ? ?[facility] ?[address] ?[place] [address] ?Phone: [phone] Fax: [phone] ? ? ? ? ?Social Determinants of Health (SDOH) Interventions ?  ? ?Readmission Risk Interventions ?No flowsheet data found. ? ? ?

## 2021-05-19 NOTE — ED Provider Notes (Signed)
[facility] EMERGENCY DEPARTMENT Provider Note   CSN: [id number] Arrival date & time: 05/19/21  1211     History  Chief Complaint  Patient presents with   Shortness of Breath    [name] is a 51 y.o. female.  HPI  51 year old female with past medical history of obesity, HTN, HLD, DM presents emergency department with concern for shortness of breath and a reported episode of SVT.  Patient states she has been sick from a respiratory standpoint for the past month.  Was admitted for multifocal pneumonia and asthma exacerbation.  Since being discharged home she has not been back to baseline.  She is been having worsening shortness of breath and presented to urgent care today where she was noted to be in SVT, given adenosine and converted to sinus tachycardia.  Sent here for further evaluation.  Patient states that she feels fatigued, easily gets short of breath.  Denies any chest pain or acute swelling of her lower extremities.  No recent fever.  Has been using her albuterol inhaler increasingly without significant relief.  Home Medications Prior to Admission medications   Medication Sig Start Date End Date Taking? Authorizing Provider  albuterol (VENTOLIN HFA) 108 (90 Base) MCG/ACT inhaler Inhale 2 puffs into the lungs every 4 (four) hours as needed for wheezing or shortness of breath. 11/23/20  Yes Violet Baldy, MD  famotidine (PEPCID) 20 MG tablet Take 20 mg by mouth 2 (two) times daily.   Yes [provider]  FLUoxetine (PROZAC) 40 MG capsule Take 2 capsules (80 mg total) by mouth daily. 05/08/21  Yes Penn, Cicely, NP  fluticasone (FLONASE) 50 MCG/ACT nasal spray Place 1 spray into both nostrils daily. 1 spray in each nostril every day Patient taking differently: Place 1 spray into both nostrils daily as needed for allergies. 03/01/20  Yes Milus Banister C, DO  fluticasone furoate-vilanterol (BREO ELLIPTA) 200-25 MCG/ACT AEPB Inhale 1 puff into the lungs  daily. 04/15/21  Yes [name], DO  gabapentin (NEURONTIN) 100 MG capsule Take 3 capsules (300 mg total) by mouth 3 (three) times daily. 04/15/21  Yes [name], DO  hydrOXYzine (VISTARIL) 50 MG capsule Take 1 capsule (50 mg total) by mouth 3 (three) times daily as needed for itching. 05/08/21  Yes Penn, Cicely, NP  insulin aspart (NOVOLOG) 100 UNIT/ML FlexPen Inject 10 units with Lunch and 15 units with Dinner Patient taking differently: 15-20 Units in the morning and at bedtime. Inject 15 units with Lunch and 20 units with Dinner 12/20/20  Yes [name], DO  Insulin Glargine (BASAGLAR KWIKPEN) 100 UNIT/ML Inject 65 Units into the skin 2 (two) times daily. Take 65U twice a day 04/22/21  Yes Espinoza, Alejandra, DO  ipratropium-albuterol (DUONEB) 0.5-2.5 (3) MG/3ML SOLN Take 3 mLs by nebulization 3 (three) times daily as needed. Patient taking differently: Take 3 mLs by nebulization 3 (three) times daily as needed (sob/wheezing). 10/12/20  Yes Domenic Moras, PA-C  levothyroxine (SYNTHROID) 300 MCG tablet Take 1 tablet by mouth once daily Patient taking differently: Take 300 mcg by mouth daily before breakfast. 04/01/21  Yes Espinoza, Alejandra, DO  lisinopril (ZESTRIL) 5 MG tablet Take 1 tablet by mouth once daily Patient taking differently: Take 5 mg by mouth daily. 04/01/21  Yes Espinoza, Alejandra, DO  LORazepam (ATIVAN) 1 MG tablet TAKE 1 TABLET BY MOUTH EVERY 8 HOURS AS NEEDED FOR ANXIETY Patient taking differently: Take 1 mg by mouth every 8 (eight) hours as needed for anxiety. 05/02/21  Yes [name], DO  metFORMIN (GLUCOPHAGE-XR) 500 MG 24 hr tablet Take 2 tablets (1,000 mg total) by mouth 2 (two) times daily with a meal. 08/09/20  Yes Hensel, Jamal Collin, MD  metoprolol succinate (TOPROL-XL) 25 MG 24 hr tablet Take 1 tablet by mouth once daily Patient taking differently: Take 25 mg by mouth daily. 04/01/21  Yes [name], DO  mirtazapine (REMERON) 30 MG tablet  Take 1 tablet (30 mg total) by mouth daily. 05/08/21  Yes Penn, Lunette Stands, NP  rosuvastatin (CRESTOR) 10 MG tablet Take 1 tablet (10 mg total) by mouth daily. 04/16/21  Yes [name], DO  budesonide-formoterol (SYMBICORT) 80-4.5 MCG/ACT inhaler Inhale 2 puffs into the lungs in the morning and at bedtime. Patient not taking: Reported on 05/19/2021 04/23/21   Chase Picket, MD  Dulaglutide (TRULICITY) 0.81 KG/8.1EH SOPN Inject 0.75 mg into the skin once a week. Patient not taking: Reported on 05/19/2021 04/15/21   [name], DO  guaiFENesin (MUCINEX) 600 MG 12 hr tablet Take 1 tablet (600 mg total) by mouth 2 (two) times daily. Patient not taking: Reported on 05/19/2021 04/23/21   Chase Picket, MD  Vitamin D, Ergocalciferol, (DRISDOL) 50000 units CAPS capsule Take 1 capsule (50,000 Units total) by mouth every 7 (seven) days. Sunday Patient not taking: Reported on 12/18/2020 12/04/16   Verner Mould, MD      Allergies    Ace inhibitors, Haldol [haloperidol lactate], Nsaids, and Latex    Review of Systems   Review of Systems  Constitutional:  Positive for fatigue. Negative for fever.  Respiratory:  Positive for cough, shortness of breath and wheezing.   Cardiovascular:  Positive for palpitations. Negative for chest pain and leg swelling.  Gastrointestinal:  Negative for abdominal pain, diarrhea and vomiting.  Skin:  Negative for rash.  Neurological:  Negative for headaches.   Physical Exam Updated Vital Signs BP 116/83    Pulse (!) 106    Temp (!) 97.5 F (36.4 C) (Oral)    Resp (!) 26    Ht 5\' 11"  (1.803 m)    Wt (!) 175.5 kg    LMP 04/21/2021    SpO2 100%    BMI 53.98 kg/m  Physical Exam Vitals and nursing note reviewed.  Constitutional:      General: She is not in acute distress.    Appearance: Normal appearance. She is obese. She is not diaphoretic.  HENT:     Head: Normocephalic.     Mouth/Throat:     Mouth: Mucous membranes are moist.  Cardiovascular:      Rate and Rhythm: Normal rate.  Pulmonary:     Effort: Pulmonary effort is normal. Tachypnea present. No respiratory distress.     Breath sounds: Examination of the right-lower field reveals rales. Examination of the left-lower field reveals rales. Decreased breath sounds and rales present.  Abdominal:     Palpations: Abdomen is soft.     Tenderness: There is no abdominal tenderness.  Musculoskeletal:     Right lower leg: Edema present.     Left lower leg: Edema present.  Skin:    General: Skin is warm.  Neurological:     Mental Status: She is alert and oriented to person, place, and time. Mental status is at baseline.  Psychiatric:        Mood and Affect: Mood normal.    ED Results / Procedures / Treatments   Labs (all labs ordered are listed, but only abnormal results are displayed)  Labs Reviewed  CBC WITH DIFFERENTIAL/PLATELET - Abnormal; Notable for the following components:      Result Value   RBC 3.51 (*)    Hemoglobin 8.6 (*)    HCT 29.2 (*)    MCH 24.5 (*)    MCHC 29.5 (*)    RDW 16.0 (*)    nRBC 0.3 (*)    All other components within normal limits  COMPREHENSIVE METABOLIC PANEL - Abnormal; Notable for the following components:   CO2 16 (*)    Glucose, Bld 391 (*)    BUN 38 (*)    Creatinine, Ser 2.56 (*)    Calcium 8.5 (*)    Total Protein 6.3 (*)    Albumin 3.1 (*)    GFR, Estimated 22 (*)    All other components within normal limits  MAGNESIUM - Abnormal; Notable for the following components:   Magnesium 1.3 (*)    All other components within normal limits  BRAIN NATRIURETIC PEPTIDE - Abnormal; Notable for the following components:   B Natriuretic Peptide 720.6 (*)    All other components within normal limits  RESP PANEL BY RT-PCR (FLU A&B, COVID) ARPGX2  TSH  TROPONIN I (HIGH SENSITIVITY)  TROPONIN I (HIGH SENSITIVITY)    EKG EKG Interpretation  Date/Time:  Sunday May 19 2021 12:21:05 EDT Ventricular Rate:  114 PR Interval:  136 QRS  Duration: 97 QT Interval:  332 QTC Calculation: 458 R Axis:   64 Text Interpretation: Sinus tachycardia Low voltage, precordial leads Borderline T abnormalities, inferior leads Confirmed by Lavenia Atlas [phone]) on 05/19/2021 1:43:05 PM  Radiology DG Chest Port 1 View  Result Date: 05/19/2021 CLINICAL DATA:  Cough and shortness of breath. EXAM: PORTABLE CHEST 1 VIEW COMPARISON:  04/25/2021 radiographs and prior studies FINDINGS: Cardiomegaly and mild pulmonary vascular congestion noted. Defibrillator/pacing pads overlying the LEFT chest noted. There is no evidence of focal airspace disease, pulmonary edema, suspicious pulmonary nodule/mass, pleural effusion, or pneumothorax. No acute bony abnormalities are identified. IMPRESSION: Cardiomegaly with mild pulmonary vascular congestion. Electronically Signed   By: Margarette Canada M.D.   On: 05/19/2021 14:04    Procedures Procedures    Medications Ordered in ED Medications  sodium chloride 0.9 % bolus 1,000 mL (1,000 mLs Intravenous New Bag/Given 05/19/21 1246)    ED Course/ Medical Decision Making/ A&P                           Medical Decision Making Amount and/or Complexity of Data Reviewed Labs: ordered. Radiology: ordered.  Risk Prescription drug management.   This patient presents to the ED for concern of tachycardia, shortness of breath, this involves an extensive number of treatment options, and is a complaint that carries with it a high risk of complications and morbidity.  The differential diagnosis includes SVT, arrhythmia, dehydration, acute illness, electrolyte abnormality, ACS/CHF, PE   Additional history obtained: -Additional history obtained from spouse at bedside -External records from outside source obtained and reviewed including: Chart review including previous notes, labs, imaging, consultation notes   Lab Tests: -I ordered, reviewed, and interpreted labs.  The pertinent results include: Baseline anemia, worsening  AKI/renal failure, elevated BNP, stable troponin   EKG -Sinus tachycardia   Imaging Studies ordered: -I ordered imaging studies including chest x-ray -I independently visualized and interpreted imaging which showed cardiomegaly and pulmonary edema/congestion -I agree with the radiologist interpretation   Medicines ordered and prescription drug management: -I ordered medication including steroids for  wheezing on initial presentation -Reevaluation of the patient after these medicines showed that the patient improved -I have reviewed the patients home medicines and have made adjustments as needed   ED Course: 51 year old female presents emergency department with shortness of breath, tachycardia.  Recently had an admission for multifocal pneumonia, has not returned back to baseline and has felt fatigued since then.  Presented to urgent care for shortness of breath, noted to be in SVT, given adenosine with conversion to sinus tachycardia.  On arrival patient has slight wheezing in lower lung fields.  Was given a DuoNeb prior to arrival, steroids ordered.  Blood work is concerning for worsening AKI/renal dysfunction.  She is a baseline anemia, BNP is elevated but troponin is negative.  Chest x-ray shows pulmonary edema with cardiomegaly.  Patient got a small amount of fluid on arrival due to sinus tachycardia, this has been stopped.  Plan for diuresis and admission.   Critical Interventions: IV fluids/diuresis   Cardiac Monitoring: The patient was maintained on a cardiac monitor.  I personally viewed and interpreted the cardiac monitored which showed an underlying rhythm of: Sinus tachycardia   Reevaluation: After the interventions noted above, I reevaluated the patient and found that they have :improved   Dispostion: Patients evaluation and results requires admission for further treatment and care.  Spoke with hospitalist, reviewed patient's ED course and they accept admission.   Patient agrees with admission plan, offers no new complaints and is stable/unchanged at time of admit.        Final Clinical Impression(s) / ED Diagnoses Final diagnoses:  None    Rx / DC Orders ED Discharge Orders     None         Lorelle Gibbs, DO 05/19/21 1518

## 2021-05-19 NOTE — Progress Notes (Signed)
FPTS Interim Night Progress Note ? ?S:Patient alert and visiting with significant other. Rounded with primary night RN. No further episodes of epigastric pain.  Snacking all night.  Patient requesting home medication for anxiety. Also requested breathing treatment. Reports no wheezing. No recurrent episodes of SVT.   Reports has voided x 2 since received Lasix.  ?O: ?Vitals:  ? 05/19/21 2100 05/20/21 0014  ?BP: (!) 142/100 131/81  ?Pulse: (!) 107 (!) 101  ?Resp: 18 20  ?Temp: 98.9 ?F (37.2 ?C) 98.9 ?F (37.2 ?C)  ?SpO2: 98% 96%  ?  ? ? ?A/P: ?Hyperglycemia without HHS ?Had received solumedrol 125 mg IV on admission.   ?CBG's overnight 351, required 20 units Aspart.   ?Increased Semglee to 60 u BID ?Repeat CBG 468. ?Give additional dose Aspart 20 u now.   ?Monitor CBG.  ?Monitor electrolytes and replete as needed  ? ?Anxiety ?Takes Ativan 1 mg TID.  Last dose yesterday. ?Will give one dose Ativan to avoid withdrawal. ?Reevaluate in am ? ?SVT resolved ?Remains tachycardic HR low 100's  ?No further episodes of SVT after receiving Magnesium 4 gm. ?Monitor electrolytes and replete as needed ? ?Asthma ?D/C Albuterol and Duonebs to avoid tachycardia in setting of recent SVT ?Start Xopenox q6h prn  ?Start Ipratropium q6h prn ?Maintain oxygen saturations >92% ? ? ? ?Carollee Leitz MD ?PGY-3, [place] ?Service pager [phone]   ?

## 2021-05-19 NOTE — ED Notes (Signed)
US tech at bedside

## 2021-05-19 NOTE — H&P (Addendum)
[facility] Admission History and Physical Service Pager: [phone]  Patient name: [name] Medical record number: [id number] Date of birth: 1971/01/17 Age: 51 y.o. Gender: female  Primary Care Provider: [name], DO Consultants: Cardiology Code Status: Full Preferred Emergency Contact: [name] (Husband) [phone]  Chief Complaint: Shortness of breath  Assessment and Plan: [name] is a 51 y.o. female presenting with shortness of breath and reported episode of SVT in setting of recent admission for multifocal pneumonia and asthma exacerbation. PMH is significant for HTN, HLD, T2DM, CKD, hypothyroidism, IDA, asthma.  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 Admitted for multifocal PNA and asthma exacerbation 1 month ago, never returned to baseline and continued to feel poor with cough, fatigue, RUQ tenderness, shortness of breath at home. She went to urgent care this morning and was noted to have SVT, treated with adenosine (see details below). In the ED, she was tachycardia to low 100s and slightly tachypneic on room air but appeared comfortable s/p IV solumedrol x1 and NS bolus x1, with SpO2 >92%. CXR with pulmonary vascular congestion and elevated BNP 720 consistent with CHF exacerbation. Most recent echocardiogram from 2019 showed LVEF 45-50% with mild global hypokinesis. Shortness of breath likely due to both SVT and CHF. Potentially also has unresolved or new pneumonia, although far less likely given imaging findings and afebrile nature without leukocytosis, WBC 7.6. No indication or clinical need for antibiotics at this time. Anemia also possible etiology of SOB given Hgb 8.6.  Also must consider PE given tachycardia, shortness of breath, tachypnea. Well's Score 4.5, placing patient in moderate risk group. Also must consider MI, but not likely given EKG and negative troponins.  Clinical picture most consistent with volume-overload 2/2 CHF exacerbation. Will treat as such and start IV diuresis with 20mg  Lasix as she is lasix naive. - Admit to FPTS Progressive, attending Dr. Andrena Mews - VS per floor - Orthostatic vital signs - Cardiac monitoring - Consultation to cardiology, appreciate their care - Echocardiogram - RUQ [name] - Lower extremity doppler US - Replete Mg with 2g IV - IV 20mg  Lasix x1 - AM CBC, BMP, Mg, Phos - Strict Is/Os - DuoNeb q4h - Albuterol 2.5mg  nebulizer q2 hours as needed - Supplemental oxygen PRN [name], goal SpO2 >92%  New onset supraventricular tachycardia Presented to urgent care and reportedly tachycardic to 190s with EKG confirmed SVT. EMS provided 6mg  IV adenosine x1 with persistent SVT, followed by second dose 12mg  adenosine which was successful in conversion to sinus tachycardia. Currently in NSR on monitors, although slightly tachycardic to 90s/low 100s. Will monitor closely for arrhythmia and consult with cardiology as this is first known time of diagnosis. Potential etiology includes heart failure, chronic lung disease If SVT should present again, will obtain STAT EKG and treat with IV Adenosine and vagal maneuver per ACLS protocol. - Continuous cardiac monitoring - Consulted cardiology, appreciate their assistance  AKI on CKD stage 3a Cr elevated to 2.56 on admission, BUN 38, GFR 22.  Baseline Cr appears 1.3-1.6. Has history of CKD Stage 3a, but suspect worsening of CKD or cardiorenal due to apparent CHF. - Daily BMP - Avoid nephrotoxic medications - Consider nephrology consultation given worsening kidney function  Anemia, hx IDA requiring blood transfusions and IV iron transfusions Hgb 8.6 Baseline appears around 9-10. No active signs of bleeding, denies melena or hematemesis/hemoptysis. Pt reports not taking PO iron because it causes constipation. Can consider  IV transfusion while inpatient if necessary. - Monitor with CBC -  Iron studies- Fe, TIBC, Ferritin  Type 2 DM Last Hgb A1c 04/15/21 11.0%,. Home meds: Trulicity 0.575mg  weekly, Novolog 15u at lunch, 20u at dinner, Basaglar 65u BID, Metformin 100mg  BID. - Hold home meds - Semglee 30u BID, to start tonight. Will re-dose tomorrow. - Resistant sliding scale insulin - CBG 4 times daily, at meals and bedtime  Anxiety, Depression Chronic, stable. Reports mood is good. Denies SI. - Continue home medications (Prozac 80mg  daily, Atarax TID PRN, Remeron 30mg  daily) - Hold home Ativan  Hypothyroidism TSH on 04/15/21 4.64.  - Continue home levothyroxine 372mcg daily  Hypertension BP well-controlled since arrival, 100-120/60-80s. - Continue home metoprolol 25mg  daily - Hold home lisinopril  Hyperlipidemia Last lipid panel 04/15/2021 not within goal LDL <70. - Continue Crestor 10mg  daily  FEN/GI: Carb modified Prophylaxis: Lovenox  Disposition: Progressive  History of Present Illness:  [name] is a 51 y.o. female presenting with SOB, general feeling unwell since discharge in February for asthma and pneumonia. States she called the after hours line last week and was recommended to go to urgent care. Endorses 2 previous asthma exacerbations in February where she says she "feels like I am breathing out of a small straw."  States since leaving the hospital 3 weeks ago has ben very winded, with fever (reported home temperature to 100.0 F), mild cough. Although, her cough has improved since being in the hospital She has also felt dizzy when standing up from sitting. Today endorses worsening SOB. Denies vomiting, diarrhea, constipation.  Does endorse left calf tenderness for the past 3 weeks, not sure about calf swelling. Does endorse using inhalers more. Endorses lack of appetite, pain in upper stomach. Denies headache, vision changes. Endorses more weakness of left hand. Denies joint pain. Does feel like she has been having palpitations, not how she felt this  morning at urgent care.   States she got shots of Adenosine that made her feel "weird."  Dd not take medications today other than breathing treatment.   Review Of Systems: Per HPI with the following additions:   Review of Systems  Constitutional:  Positive for fatigue and fever. Negative for chills and diaphoresis.  HENT:  Negative for congestion, rhinorrhea, sinus pressure, sinus pain and sore throat.   Respiratory:  Positive for cough, shortness of breath and wheezing.   Cardiovascular:  Positive for palpitations and leg swelling. Negative for chest pain.  Gastrointestinal:  Positive for abdominal distention and abdominal pain. Negative for blood in stool, diarrhea, nausea and vomiting.  Genitourinary:  Negative for dysuria, frequency and hematuria.  Musculoskeletal:  Negative for myalgias, neck pain and neck stiffness.  Neurological:  Positive for dizziness. Negative for speech difficulty, weakness and headaches.  Psychiatric/Behavioral:  Negative for suicidal ideas.     Patient Active Problem List   Diagnosis Date Noted   Multifocal pneumonia 04/25/2021   Severe sepsis [facility])    Healthcare maintenance 04/15/2021   Asthma 12/18/2020   Vaginal irritation 09/28/2020   Mild depression 08/30/2020   Arthritis of right ankle 07/25/2020   Right knee pain 06/21/2020   Sinus pain 03/01/2020   Bleeding nose 03/01/2020   Depression 11/21/2019   Moderate episode of recurrent major depressive disorder ([place]) 09/22/2019   Generalized anxiety disorder 09/22/2019   Bipolar I disorder, most recent episode depressed ([place]) 09/22/2019   Bilateral knee pain 03/14/2019   Osteoarthritis 11/24/2018   Bipolar disorder ([place]) 04/29/2017   HLD (  hyperlipidemia) 03/19/2017   Asthma exacerbation 03/14/2017   Weight gain 12/06/2016   MDD (major depressive disorder), recurrent severe, 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/11/2015   Dyspnea 05/09/2015   Essential hypertension    Vitamin D deficiency 04/11/2015    Fatigue 04/10/2015   Non compliance w medication regimen 04/10/2015   Thyroid activity decreased    Adjustment disorder with mixed anxiety and depressed mood    Cough 09/20/2014   Grief at loss of child 06/22/2014   Dysmenorrhea 11/25/2013   Suicidal ideation 05/27/2011   CKD (chronic kidney disease) stage 3, GFR 30-59 ml/min ([place]) 11/06/2010   ENDOMETRIAL HYPERPLASIA UNSPECIFIED 02/07/2008   MENORRHAGIA 01/03/2008   Allergic rhinitis 06/24/2007   Essential hypertension, benign 02/03/2007   Hypothyroidism 05/07/2006   Type II diabetes mellitus with complication ([place]) 38/12/1749   Morbid obesity ([place]) 05/07/2006   Iron deficiency anemia 05/07/2006   Anxiety 05/07/2006   OBSESSIVE COMPUL. DISORDER 05/07/2006    Past Medical History: Past Medical History:  Diagnosis Date   Acute renal failure ([place]) 05/27/10   hemodialysis for 6 weeks   Anxiety    Asthma    Depression    Diabetes mellitus    Hypertension    Rhabdomyolysis 06/06/10   after drug overdose   Suicide attempt by drug ingestion ([place]) 06/05/10   result rhabdomyolosis and ARF requrining dialysis     Past Surgical History: Past Surgical History:  Procedure Laterality Date   ANKLE ARTHROSCOPY Right 08/17/2015   Procedure: RIGHT ANKLE ARTHROSCOPY WITH SYNOVECTOMY AND LOOSE BODY EXCISION;  Surgeon: Melrose Nakayama, MD;  Location: [facility];  Service: Orthopedics;  Laterality: Right;   CHOLECYSTECTOMY     TUBAL LIGATION  1998    Social History: Social History   Tobacco Use   Smoking status: Never   Smokeless tobacco: Never  Vaping Use   Vaping Use: Never used  Substance Use Topics   Alcohol use: No   Drug use: No    Family History: Family History  Problem Relation Age of Onset   Asthma Father     Allergies and Medications: Allergies  Allergen Reactions   Ace Inhibitors Other (See Comments)    Patient had acute renal failure requiring hemodialysis after a suicide attempt.  Nephro recommended avoiding use.    Haldol [Haloperidol Lactate] Other (See Comments)    Tardive dyskinesia   Nsaids Other (See Comments)    Nephro recommended avoiding after acute renal failure requiring hemodialysis associated with a suicide attempt.   Latex Other (See Comments)    Rash around IV site   No current facility-administered medications on file prior to encounter.   Current Outpatient Medications on File Prior to Encounter  Medication Sig Dispense Refill   albuterol (VENTOLIN HFA) 108 (90 Base) MCG/ACT inhaler Inhale 2 puffs into the lungs every 4 (four) hours as needed for wheezing or shortness of breath. 18 g 0   famotidine (PEPCID) 20 MG tablet Take 20 mg by mouth 2 (two) times daily.     FLUoxetine (PROZAC) 40 MG capsule Take 2 capsules (80 mg total) by mouth daily. 60 capsule 3   fluticasone (FLONASE) 50 MCG/ACT nasal spray Place 1 spray into both nostrils daily. 1 spray in each nostril every day (Patient taking differently: Place 1 spray into both nostrils daily as needed for allergies.) 16 g 1   fluticasone furoate-vilanterol (BREO ELLIPTA) 200-25 MCG/ACT AEPB Inhale 1 puff into the lungs daily. 1 each 0   gabapentin (NEURONTIN) 100 MG capsule Take 3 capsules (  300 mg total) by mouth 3 (three) times daily. 90 capsule 3   hydrOXYzine (VISTARIL) 50 MG capsule Take 1 capsule (50 mg total) by mouth 3 (three) times daily as needed for itching. 90 capsule 3   insulin aspart (NOVOLOG) 100 UNIT/ML FlexPen Inject 10 units with Lunch and 15 units with Dinner (Patient taking differently: 15-20 Units in the morning and at bedtime. Inject 15 units with Lunch and 20 units with Dinner) 15 mL 11   Insulin Glargine (BASAGLAR KWIKPEN) 100 UNIT/ML Inject 65 Units into the skin 2 (two) times daily. Take 65U twice a day 15 mL 6   ipratropium-albuterol (DUONEB) 0.5-2.5 (3) MG/3ML SOLN Take 3 mLs by nebulization 3 (three) times daily as needed. (Patient taking differently: Take 3 mLs by nebulization 3 (three) times daily as needed  (sob/wheezing).) 360 mL 0   levothyroxine (SYNTHROID) 300 MCG tablet Take 1 tablet by mouth once daily (Patient taking differently: Take 300 mcg by mouth daily before breakfast.) 90 tablet 0   lisinopril (ZESTRIL) 5 MG tablet Take 1 tablet by mouth once daily (Patient taking differently: Take 5 mg by mouth daily.) 90 tablet 3   LORazepam (ATIVAN) 1 MG tablet TAKE 1 TABLET BY MOUTH EVERY 8 HOURS AS NEEDED FOR ANXIETY (Patient taking differently: Take 1 mg by mouth every 8 (eight) hours as needed for anxiety.) 45 tablet 0   metFORMIN (GLUCOPHAGE-XR) 500 MG 24 hr tablet Take 2 tablets (1,000 mg total) by mouth 2 (two) times daily with a meal. 120 tablet 11   metoprolol succinate (TOPROL-XL) 25 MG 24 hr tablet Take 1 tablet by mouth once daily (Patient taking differently: Take 25 mg by mouth daily.) 90 tablet 3   mirtazapine (REMERON) 30 MG tablet Take 1 tablet (30 mg total) by mouth daily. 30 tablet 3   rosuvastatin (CRESTOR) 10 MG tablet Take 1 tablet (10 mg total) by mouth daily. 90 tablet 3   budesonide-formoterol (SYMBICORT) 80-4.5 MCG/ACT inhaler Inhale 2 puffs into the lungs in the morning and at bedtime. (Patient not taking: Reported on 05/19/2021) 1 each 12   Dulaglutide (TRULICITY) 9.83 JA/2.5KN SOPN Inject 0.75 mg into the skin once a week. (Patient not taking: Reported on 05/19/2021) 0.5 mL 0   guaiFENesin (MUCINEX) 600 MG 12 hr tablet Take 1 tablet (600 mg total) by mouth 2 (two) times daily. (Patient not taking: Reported on 05/19/2021) 30 tablet 0   Vitamin D, Ergocalciferol, (DRISDOL) 50000 units CAPS capsule Take 1 capsule (50,000 Units total) by mouth every 7 (seven) days. Sunday (Patient not taking: Reported on 12/18/2020) 12 capsule 0    Objective: BP 116/83    Pulse (!) 106    Temp (!) 97.5 F (36.4 C) (Oral)    Resp (!) 26    Ht 5\' 11"  (1.803 m)    Wt (!) 175.5 kg    LMP 04/21/2021    SpO2 100%    BMI 53.98 kg/m  Exam: General: Female who appears stated age laying in bed Eyes:  Pupils PERRLA, EOMI. ENTM: MMM, good dentition Neck: Normal ROM Cardiovascular: Tachycardic to 110s, normal rhythm. No murmurs appreciated. Respiratory: Limited exam 2/2 body habitus but no notable wheezing or crackles. Diminished air sounds Gastrointestinal: Obese, soft, exquisitely tender to palpation in RUQ and epigastrium, Normoactive bowel sounds MSK: No joint deformities Ext: 2+ pitting edema bilaterally in lower extremities Derm: Warm, dry, no rashes or lesions visible Neuro: Alert, oriented x4. Speech clear and fluent. No focal neurologic deficits Psych: Pleasant. Normal  mood and affect.  Labs and Imaging: CBC BMET  Recent Labs  Lab 05/19/21 1243  WBC 7.6  HGB 8.6*  HCT 29.2*  PLT 392   Recent Labs  Lab 05/19/21 1243  NA 135  K 4.6  CL 104  CO2 16*  BUN 38*  CREATININE 2.56*  GLUCOSE 391*  CALCIUM 8.5*     EKG: My own interpretation (not copied from electronic read) Sinus tachycardia, no ST or T wave changes  Orvis Brill, DO 05/19/2021, 2:49 PM PGY-1, [place] Intern pager: [phone], text pages welcome

## 2021-05-19 NOTE — ED Triage Notes (Signed)
Pt arrive by [facility]link from Urgent care for Shortness of breath and SVT. Denies hx of SVT. Pt has been having trouble with asthma for 5 days has been taking at home meds, today breathing was worse and her family provider told her to get seen.  ? ?Initial HR 195. EMS gave 6mg  without rate converting and then gave 12mg  rate converted to sinus tachycardia in the 120s   ?Also gave a duo-neb treatment and an albuterol treatment  ? ?Pt is complaining of nausea and abdominal pain ? ? ? ?

## 2021-05-19 NOTE — ED Notes (Signed)
Adenosine administered per [facility]. ?

## 2021-05-19 NOTE — ED Provider Notes (Signed)
Patient seen and examined in triage room.  Patient accompanied with husband with significant respiratory distress, chest pain husband reports this morning since the patient woke up.  Recently discharged from the hospital with pneumonia.  Heart rate in the 190s, EKG confirms SVT.  Patient with reports unable to breathe, has been taking at home nebulizer without relief of symptoms.  EMS called.  Duoneb breathing treatment initiated.  IV line inserted, patient placed on zoll.  [facility] arrived and adenosine 6 mg IV given; heart rate slowed momentarily then resumed 190 bpm.  Adenosine 12 mg IV then given, heart rate slowed to 120s sinus tachycardia.  Patient transported to hospital via [place]. ?  ?Eulogio Bear, NP ?05/19/21 1209 ? ?

## 2021-05-20 ENCOUNTER — Inpatient Hospital Stay (HOSPITAL_COMMUNITY): Payer: BC Managed Care – PPO

## 2021-05-20 ENCOUNTER — Encounter (HOSPITAL_COMMUNITY): Payer: Self-pay | Admitting: Student

## 2021-05-20 DIAGNOSIS — R0600 Dyspnea, unspecified: Secondary | ICD-10-CM | POA: Diagnosis not present

## 2021-05-20 DIAGNOSIS — R0602 Shortness of breath: Secondary | ICD-10-CM

## 2021-05-20 DIAGNOSIS — E1165 Type 2 diabetes mellitus with hyperglycemia: Secondary | ICD-10-CM

## 2021-05-20 DIAGNOSIS — I471 Supraventricular tachycardia: Secondary | ICD-10-CM

## 2021-05-20 DIAGNOSIS — M79605 Pain in left leg: Secondary | ICD-10-CM

## 2021-05-20 DIAGNOSIS — R0609 Other forms of dyspnea: Secondary | ICD-10-CM

## 2021-05-20 DIAGNOSIS — N183 Chronic kidney disease, stage 3 unspecified: Secondary | ICD-10-CM

## 2021-05-20 DIAGNOSIS — N179 Acute kidney failure, unspecified: Secondary | ICD-10-CM

## 2021-05-20 DIAGNOSIS — I509 Heart failure, unspecified: Secondary | ICD-10-CM | POA: Diagnosis not present

## 2021-05-20 LAB — COMPREHENSIVE METABOLIC PANEL
ALT: 28 U/L (ref 0–44)
ALT: 26 U/L (ref 0–44)
AST: 21 U/L (ref 15–41)
AST: 22 U/L (ref 15–41)
Albumin: 3.2 g/dL — ABNORMAL LOW (ref 3.5–5.0)
Albumin: 3.1 g/dL — ABNORMAL LOW (ref 3.5–5.0)
Alkaline Phosphatase: 60 U/L (ref 38–126)
Alkaline Phosphatase: 57 U/L (ref 38–126)
Anion gap: 11 (ref 5–15)
Anion gap: 13 (ref 5–15)
BUN: 43 mg/dL — ABNORMAL HIGH (ref 6–20)
BUN: 46 mg/dL — ABNORMAL HIGH (ref 6–20)
CO2: 17 mmol/L — ABNORMAL LOW (ref 22–32)
CO2: 18 mmol/L — ABNORMAL LOW (ref 22–32)
Calcium: 8.7 mg/dL — ABNORMAL LOW (ref 8.9–10.3)
Calcium: 8.9 mg/dL (ref 8.9–10.3)
Chloride: 104 mmol/L (ref 98–111)
Chloride: 106 mmol/L (ref 98–111)
Creatinine, Ser: 2.41 mg/dL — ABNORMAL HIGH (ref 0.44–1.00)
Creatinine, Ser: 2.3 mg/dL — ABNORMAL HIGH (ref 0.44–1.00)
GFR, Estimated: 24 mL/min — ABNORMAL LOW (ref >60)
GFR, Estimated: 25 mL/min — ABNORMAL LOW (ref >60)
Glucose, Bld: 509 mg/dL (ref 70–99)
Glucose, Bld: 197 mg/dL — ABNORMAL HIGH (ref 70–99)
Potassium: 6.2 mmol/L — ABNORMAL HIGH (ref 3.5–5.1)
Potassium: 5 mmol/L (ref 3.5–5.1)
Sodium: 132 mmol/L — ABNORMAL LOW (ref 135–145)
Sodium: 137 mmol/L (ref 135–145)
Total Bilirubin: 0.5 mg/dL (ref 0.3–1.2)
Total Bilirubin: 0.6 mg/dL (ref 0.3–1.2)
Total Protein: 6.9 g/dL (ref 6.5–8.1)
Total Protein: 6.8 g/dL (ref 6.5–8.1)

## 2021-05-20 LAB — IRON AND TIBC
Iron: 14 ug/dL — ABNORMAL LOW (ref 28–170)
Saturation Ratios: 3 % — ABNORMAL LOW (ref 10.4–31.8)
TIBC: 410 ug/dL (ref 250–450)
UIBC: 396 ug/dL

## 2021-05-20 LAB — CBC WITH DIFFERENTIAL/PLATELET
Abs Immature Granulocytes: 0.04 10*3/uL (ref 0.00–0.07)
Basophils Absolute: 0 10*3/uL (ref 0.0–0.1)
Basophils Relative: 0 %
Eosinophils Absolute: 0 10*3/uL (ref 0.0–0.5)
Eosinophils Relative: 0 %
HCT: 27.8 % — ABNORMAL LOW (ref 36.0–46.0)
Hemoglobin: 8.6 g/dL — ABNORMAL LOW (ref 12.0–15.0)
Immature Granulocytes: 1 %
Lymphocytes Relative: 8 %
Lymphs Abs: 0.6 10*3/uL — ABNORMAL LOW (ref 0.7–4.0)
MCH: 25.1 pg — ABNORMAL LOW (ref 26.0–34.0)
MCHC: 30.9 g/dL (ref 30.0–36.0)
MCV: 81.3 fL (ref 80.0–100.0)
Monocytes Absolute: 0.2 10*3/uL (ref 0.1–1.0)
Monocytes Relative: 2 %
Neutro Abs: 7 10*3/uL (ref 1.7–7.7)
Neutrophils Relative %: 89 %
Platelets: 397 10*3/uL (ref 150–400)
RBC: 3.42 MIL/uL — ABNORMAL LOW (ref 3.87–5.11)
RDW: 15.9 % — ABNORMAL HIGH (ref 11.5–15.5)
WBC: 7.9 10*3/uL (ref 4.0–10.5)
nRBC: 0.3 % — ABNORMAL HIGH (ref 0.0–0.2)

## 2021-05-20 LAB — ECHOCARDIOGRAM COMPLETE
Area-P 1/2: 4.8 cm2
Calc EF: 43.5 %
Height: 71 in
MV M vel: 4.58 m/s
MV Peak grad: 83.9 mmHg
Radius: 0.5 cm
S' Lateral: 3.7 cm
Single Plane A2C EF: 43 %
Single Plane A4C EF: 40.5 %
Weight: 6192 oz

## 2021-05-20 LAB — BASIC METABOLIC PANEL
Anion gap: 12 (ref 5–15)
BUN: 44 mg/dL — ABNORMAL HIGH (ref 6–20)
CO2: 18 mmol/L — ABNORMAL LOW (ref 22–32)
Calcium: 8.9 mg/dL (ref 8.9–10.3)
Chloride: 106 mmol/L (ref 98–111)
Creatinine, Ser: 2.41 mg/dL — ABNORMAL HIGH (ref 0.44–1.00)
GFR, Estimated: 24 mL/min — ABNORMAL LOW (ref >60)
Glucose, Bld: 208 mg/dL — ABNORMAL HIGH (ref 70–99)
Potassium: 6 mmol/L — ABNORMAL HIGH (ref 3.5–5.1)
Sodium: 136 mmol/L (ref 135–145)

## 2021-05-20 LAB — GLUCOSE, CAPILLARY
Glucose-Capillary: 468 mg/dL — ABNORMAL HIGH (ref 70–99)
Glucose-Capillary: 316 mg/dL — ABNORMAL HIGH (ref 70–99)
Glucose-Capillary: 196 mg/dL — ABNORMAL HIGH (ref 70–99)
Glucose-Capillary: 175 mg/dL — ABNORMAL HIGH (ref 70–99)
Glucose-Capillary: 148 mg/dL — ABNORMAL HIGH (ref 70–99)

## 2021-05-20 LAB — URINALYSIS, COMPLETE (UACMP) WITH MICROSCOPIC
Bilirubin Urine: NEGATIVE
Glucose, UA: NEGATIVE mg/dL
Hgb urine dipstick: NEGATIVE
Ketones, ur: NEGATIVE mg/dL
Leukocytes,Ua: NEGATIVE
Nitrite: NEGATIVE
Protein, ur: NEGATIVE mg/dL
Specific Gravity, Urine: 1.013 (ref 1.005–1.030)
pH: 5 (ref 5.0–8.0)

## 2021-05-20 LAB — MAGNESIUM: Magnesium: 1.9 mg/dL (ref 1.7–2.4)

## 2021-05-20 LAB — FERRITIN: Ferritin: 10 ng/mL — ABNORMAL LOW (ref 11–307)

## 2021-05-20 LAB — POTASSIUM: Potassium: 5.6 mmol/L — ABNORMAL HIGH (ref 3.5–5.1)

## 2021-05-20 LAB — HIV ANTIBODY (ROUTINE TESTING W REFLEX): HIV Screen 4th Generation wRfx: NONREACTIVE

## 2021-05-20 MED ORDER — INSULIN ASPART 100 UNIT/ML IJ SOLN: 8.0000 [IU] | Freq: Once | INTRAMUSCULAR | Status: AC

## 2021-05-20 MED ORDER — METOPROLOL TARTRATE 25 MG PO TABS
25.0000 mg | ORAL_TABLET | Freq: Two times a day (BID) | ORAL | Status: DC
  Administered 2021-05-20 – 2021-05-21 (×2): 25 mg via ORAL
  Filled 2021-05-20 (×2): qty 1

## 2021-05-20 MED ORDER — INSULIN ASPART 100 UNIT/ML IJ SOLN
20.0000 [IU] | Freq: Once | INTRAMUSCULAR | Status: AC
  Administered 2021-05-20: 20 [IU] via SUBCUTANEOUS

## 2021-05-20 MED ORDER — ENOXAPARIN SODIUM 80 MG/0.8ML IJ SOSY
80.0000 mg | PREFILLED_SYRINGE | INTRAMUSCULAR | Status: DC
  Administered 2021-05-20: 80 mg via SUBCUTANEOUS
  Filled 2021-05-20: qty 0.8

## 2021-05-20 MED ORDER — DILTIAZEM HCL 60 MG PO TABS
30.0000 mg | ORAL_TABLET | Freq: Three times a day (TID) | ORAL | Status: DC
  Administered 2021-05-20: 30 mg via ORAL
  Filled 2021-05-20: qty 1

## 2021-05-20 MED ORDER — FUROSEMIDE 10 MG/ML IJ SOLN: 40.0000 mg | Freq: Two times a day (BID) | INTRAMUSCULAR | Status: DC

## 2021-05-20 MED ORDER — LORAZEPAM 1 MG PO TABS
1.0000 mg | ORAL_TABLET | Freq: Three times a day (TID) | ORAL | Status: DC | PRN
  Administered 2021-05-21: 1 mg via ORAL
  Filled 2021-05-20: qty 1

## 2021-05-20 MED ORDER — LORAZEPAM 1 MG PO TABS
1.0000 mg | ORAL_TABLET | Freq: Once | ORAL | Status: AC
Start: 2021-05-20 — End: 2021-05-20
  Administered 2021-05-20: 1 mg via ORAL
  Filled 2021-05-20: qty 1

## 2021-05-20 MED ORDER — INSULIN GLARGINE-YFGN 100 UNIT/ML ~~LOC~~ SOLN
65.0000 [IU] | Freq: Two times a day (BID) | SUBCUTANEOUS | Status: DC
  Administered 2021-05-20 – 2021-05-21 (×2): 65 [IU] via SUBCUTANEOUS
  Filled 2021-05-20 (×3): qty 0.65

## 2021-05-20 MED ORDER — FUROSEMIDE 40 MG PO TABS: 40.0000 mg | ORAL_TABLET | Freq: Two times a day (BID) | ORAL | Status: DC

## 2021-05-20 MED ORDER — PERFLUTREN LIPID MICROSPHERE
1.0000 mL | INTRAVENOUS | Status: AC | PRN
  Administered 2021-05-20: 2 mL via INTRAVENOUS
  Filled 2021-05-20: qty 10

## 2021-05-20 MED ORDER — INSULIN ASPART PROT & ASPART (70-30 MIX) 100 UNIT/ML ~~LOC~~ SUSP: 8.0000 [IU] | Freq: Two times a day (BID) | SUBCUTANEOUS | Status: DC

## 2021-05-20 MED ORDER — ACETAMINOPHEN 325 MG PO TABS
650.0000 mg | ORAL_TABLET | Freq: Four times a day (QID) | ORAL | Status: DC | PRN
  Administered 2021-05-20 – 2021-05-21 (×2): 650 mg via ORAL
  Filled 2021-05-20 (×2): qty 2

## 2021-05-20 MED ORDER — SODIUM ZIRCONIUM CYCLOSILICATE 10 G PO PACK
10.0000 g | PACK | Freq: Once | ORAL | Status: AC
  Administered 2021-05-20: 10 g via ORAL
  Filled 2021-05-20: qty 1

## 2021-05-20 MED ORDER — SODIUM ZIRCONIUM CYCLOSILICATE 10 G PO PACK
10.0000 g | PACK | Freq: Once | ORAL | Status: AC
Start: 2021-05-20 — End: 2021-05-20
  Administered 2021-05-20: 10 g via ORAL
  Filled 2021-05-20: qty 1

## 2021-05-20 MED ORDER — MAGNESIUM SULFATE 2 GM/50ML IV SOLN
2.0000 g | Freq: Once | INTRAVENOUS | Status: AC
  Administered 2021-05-20: 2 g via INTRAVENOUS
  Filled 2021-05-20: qty 50

## 2021-05-20 MED ORDER — CALCIUM GLUCONATE-NACL 1-0.675 GM/50ML-% IV SOLN
1.0000 g | Freq: Once | INTRAVENOUS | Status: AC
  Administered 2021-05-20: 1000 mg via INTRAVENOUS
  Filled 2021-05-20: qty 50

## 2021-05-20 MED ORDER — INSULIN ASPART 100 UNIT/ML IJ SOLN
8.0000 [IU] | Freq: Two times a day (BID) | INTRAMUSCULAR | Status: DC
  Administered 2021-05-20 – 2021-05-21 (×3): 8 [IU] via SUBCUTANEOUS

## 2021-05-20 MED ORDER — INSULIN ASPART 100 UNIT/ML IJ SOLN
10.0000 [IU] | Freq: Once | INTRAMUSCULAR | Status: AC
  Administered 2021-05-20: 10 [IU] via SUBCUTANEOUS

## 2021-05-20 MED ORDER — SODIUM CHLORIDE 0.9 % IV SOLN
250.0000 mg | Freq: Every day | INTRAVENOUS | Status: AC
  Administered 2021-05-20 – 2021-05-21 (×2): 250 mg via INTRAVENOUS
  Filled 2021-05-20 (×2): qty 20

## 2021-05-20 MED ORDER — FUROSEMIDE 10 MG/ML IJ SOLN
20.0000 mg | Freq: Two times a day (BID) | INTRAMUSCULAR | Status: DC
  Administered 2021-05-20 – 2021-05-21 (×3): 20 mg via INTRAVENOUS
  Filled 2021-05-20 (×3): qty 2

## 2021-05-20 NOTE — Progress Notes (Signed)
Mobility Specialist Progress Note ? ? 05/20/21 1729  ?Mobility  ?Activity Refused mobility ?(Having dinner)  ? ?Received pt eating dinner and requesting to see MS at a later time. D/t time sensitivity I will f/u tomorrow, updated pt as well.  ? ?Holland Falling ?Mobility Specialist ?Phone Number [phone] ? ?

## 2021-05-20 NOTE — Progress Notes (Signed)
Family Medicine Teaching Service ?Daily Progress Note ?Intern Pager: [phone] ? ?Patient name: [name] Medical record number: [id number] ?Date of birth: [birth year] Age: 51 y.o. Gender: female ? ?Primary Care Provider: [name], DO ?Consultants: Cardiology ?Code Status: FULL ? ?Pt Overview and Major Events to Date:  ?3/12- Admitted ? ?Assessment and Plan: ?[name] is a 51 year-old female who presented with SOB in the setting of SVT and likely CHF exacerbation, receiving IV diuresis. PMH significant for HTN, HLD, T2DM, CKD, hypothyroidism, IDA, asthma. ? ?HFmrEF exacerbation, stable ?Reports breathing feels improved. UOP not documented in chart, but pt reported 2 urine outputs. Stable ORA, no hypoxia or dyspnea overnight. Still appears volume-overloaded with 2+ pitting edema in BL LE. Plan to continue IV diuresis. ?- Cardiology following, appreciate recommendations ?- IV Lasix 20mg  BID ?- Strict I/O, daily weights ?- Mg >2 ?- Repeat TTE today ?- AM BMP ?- PT/OT eval and treat ? ?SVT, improved ?No SVT overnight. In NSR this morning, HR in 90s. Episodes of SVT yesterday may have been triggered by albuterol use, CHF or electrolyte abnormalities, per cardiology. TSH wnl. Will avoid albuterol and use Xopenex and ipratropium instead. ?- Continuous cardiac monitoring ?- Avoid triggers ?- Start Diltiazem 30mg  q8h ? ?Hypomagnasemia ?Hyperkalemia, improving ?K+ 6.2 overnight. STAT EKG without peaked T waves or ST elevation. Lokelma 10mg  x1 administered. Repeat K+ 5.6. Administered 10u insulin to bring down potassium further in addition to elevated CBG. ?Mg 1.3, 1.9. Repleted with 6g total to keep >2. ?- Monitor for cardiac symptoms ?- Repeat BMP at 1200 ? ?Asthma ?Received Xopenex x1 overnight for wheezing. Comfortable work of breathing on room air this morning. ?- Pulse ox, SpO2 >92% goal ?- Xopenex q6h PRN wheezing, SOB ?- Ipatropium  nebs q6h PRN wheezing, SOB ? ?AKI on CKD stage 3a ?Cr 2.41, down from  2.56 yesterday. GFR 24. Baseline 1.3-1.6. ?- Monitor Cr closely on BMP ?- Avoid nephrotoxic medications ?- Can consider nephrology consultation given worsening kidney function ? ?Hx IDA ?Iron 14, Ferritin 10, TIBC 410.  ?- Consider PO vs IV iron supplementation ?- Monitor with labs ? ?T2DM, poorly controlled on inuslin ?CBG persistently elevated overnight in 400-500s. Likely component of steroid induced hyperglycemia s/p IV Solumedrol 125. Received 50 u short acting in past 12 hours. Semglee increased to 60u overnight to better control CBG. ?- Semglee 60u BID ?- Resistant SSI ?- CBG 4 times daily, with meals and at bedtime ?- consult to diabetes coordinator ? ?Anxiety/depression ?PDMP reviewed- fills Ativan monthly. Required x1 overnight. Will order home Ativan 1mg  q8h PRN to avoid withdrawal ?- Continue Prozac ?- Resume Ativan, as above ? ?Other conditions chronic and stable: ?Hypothyroidism, Hypertension, Hyperlipidemia ? ?FEN/GI: Heart healthy/carb modified ?PPx: Lovenox ?Dispo:Pending PT recommendations  pending clinical improvement . Barriers include IV diuresis.  ? ?Subjective:  ?Feeling better this morning. Says her breathing is easier. Reports 2 urine outputs since arrival to the floor, none in the ED. Denies any pain. Was curious about her labs this morning. ? ?Objective: ?Temp:  [97.5 ?F (36.4 ?C)-98.9 ?F (37.2 ?C)] 97.6 ?F (36.4 ?C) (03/13 0726) ?Pulse Rate:  [90-190] 94 (03/13 0726) ?Resp:  [13-37] 20 (03/13 0726) ?BP: (102-142)/(67-100) 131/89 (03/13 0726) ?SpO2:  [94 %-100 %] 97 % (03/13 0726) ?Weight:  [175.5 kg] 175.5 kg (03/12 1223) ?Physical Exam: ?General: Laying in bed on side, comfortable, NAD ?Cardiovascular: RRR, HR in 90s ?Respiratory: Clear in all fields, although limited exam 2/2 body habitus. No apparent wheezing or crackles.  On room air with SpO2>92%. Normal work of breathing ?Abdomen: Obese, soft, normal bowel sounds ?Extremities: Warm, dry. 2+ pitting edema  bilaterally. ? ?Laboratory: ?Recent Labs  ?Lab 05/19/21 ?1243 05/20/21 ?0326  ?WBC 7.6 7.9  ?HGB 8.6* 8.6*  ?HCT 29.2* 27.8*  ?PLT 392 397  ? ?Recent Labs  ?Lab 05/19/21 ?1243 05/20/21 ?0326 05/20/21 ?1388  ?NA 135 132*  --   ?K 4.6 6.2* 5.6*  ?CL 104 104  --   ?CO2 16* 17*  --   ?BUN 38* 43*  --   ?CREATININE 2.56* 2.41*  --   ?CALCIUM 8.5* 8.7*  --   ?PROT 6.3* 6.9  --   ?BILITOT 0.7 0.5  --   ?ALKPHOS 56 60  --   ?ALT 29 28  --   ?AST 21 21  --   ?GLUCOSE 391* 509*  --   ? ? ?Imaging/Diagnostic Tests: ?DG Chest Port 1 View ? ?Result Date: 05/19/2021 ?CLINICAL DATA:  Cough and shortness of breath. EXAM: PORTABLE CHEST 1 VIEW COMPARISON:  04/25/2021 radiographs and prior studies FINDINGS: Cardiomegaly and mild pulmonary vascular congestion noted. Defibrillator/pacing pads overlying the LEFT chest noted. There is no evidence of focal airspace disease, pulmonary edema, suspicious pulmonary nodule/mass, pleural effusion, or pneumothorax. No acute bony abnormalities are identified. IMPRESSION: Cardiomegaly with mild pulmonary vascular congestion. Electronically Signed   By: Margarette Canada M.D.   On: 05/19/2021 14:04  ? ?US Abdomen Limited RUQ (LIVER/GB) ? ?Result Date: 05/19/2021 ?CLINICAL DATA:  assisting pt with increased heart rate and breathing. Will check back. 3/12 at 5:20pm. EXAM: ULTRASOUND ABDOMEN LIMITED RIGHT UPPER QUADRANT COMPARISON:  None. FINDINGS: Gallbladder: Status post cholecystectomy. Common bile duct: Diameter: 4 mm. Liver: No focal lesion identified. Increased parenchymal echogenicity. Portal vein is patent on color Doppler imaging with normal direction of blood flow towards the liver. Other: None. IMPRESSION: 1. Hepatic steatosis. Please note limited evaluation for focal hepatic masses in a patient with hepatic steatosis due to decreased penetration of the acoustic ultrasound waves. 2. Status post cholecystectomy. Electronically Signed   By: Iven Finn M.D.   On: 05/19/2021 18:53   ? ? ?Orvis Brill, DO ?05/20/2021, 8:12 AM ?PGY-1, [place] ?[place] Intern pager: [phone], text pages welcome ? ?

## 2021-05-20 NOTE — Progress Notes (Signed)
Inpatient Diabetes Program Recommendations ? ?AACE/ADA: New Consensus Statement on Inpatient Glycemic Control (2015) ? ?Target Ranges:  Prepandial:   less than 140 mg/dL ?     Peak postprandial:   less than 180 mg/dL (1-2 hours) ?     Critically ill patients:  140 - 180 mg/dL  ? ?Lab Results  ?Component Value Date  ? GLUCAP 316 (H) 05/20/2021  ? HGBA1C 11.0 (A) 04/15/2021  ? ? ?Review of Glycemic Control ? ?Diabetes history: DM 2 ?Outpatient Diabetes medications: Basaglar 65 units bid, Novolog 15 units breakfast, 20 units lunch, 20 units supper, Metformin 1000 mg bid ?Current orders for Inpatient glycemic control:  ?Semglee 60 units bid ?Novolog 0-20 units tid ?Solumedrol 125 given yesterday 3/12 ? ?Inpatient Diabetes Program Recommendations:   ? ?-  Increase Semglee to home dose to 65 units bid ?-  Add Novolog 8 units tid meal coverage ( 50% of home dose) if eating >50% of meals to help with prandial spikes due to steroid dose. ? ?Thanks, ? ?Tama Headings RN, MSN, BC-ADM ?Inpatient Diabetes Coordinator ?Team Pager [phone] (8a-5p) ? ? ?

## 2021-05-20 NOTE — Progress Notes (Signed)
Serum glucose 509 and Potassium 6.2 this am. Labs drawn prior to receiving additional 20 units of Aspart.  Patient asymptomatic and hemodynamically stable. Stat ECG and K ordered. ?ECG shows no ST elevation or peaked T waves.  Corrected Qtc 458. ?Follow up with stat potassium ?Lokelma 10 mg x1 ?Repeat BMP at noon ?Monitor for any cardiac symptoms ? ?Carollee Leitz, MD ?Family Medicine Residency   ?

## 2021-05-20 NOTE — Consult Note (Signed)
Cardiology Consultation:   Patient ID: [name] MRN: [id number]; DOB: 1971-03-06  Admit date: 05/19/2021 Date of Consult: 05/20/2021  PCP:  [name], DO   [facility] Providers Cardiologist:  None        Patient Profile:   [name] is a 51 y.o. female with a hx of HTN, HLD, T2DM, CKD, hypothyroidism, IDA, asthma who is being seen 05/20/2021 for the evaluation of SVT at the request of Dr. Gwendlyn Deutscher.  History of Present Illness:   [name] is a 51 y.o. female with a hx of HTN, HLD, T2DM, CKD, hypothyroidism, IDA, asthma who is being seen 05/20/2021 for the evaluation of SVT at the request of Dr. Gwendlyn Deutscher. Patient has been having SOB and cough since some time. Admitted for multifocal PNA and asthma exacerbation 1 month ago, and since then her symptoms have been lingerin on. She went to the urgent care where she was found to be in SVT and was treated with adenosine. Here in the ER, she continued to be tachycardic and had another episode of SVT which self resolved in 10-15 minutes. Most recent echocardiogram from 2019 showed LVEF 45-50% with mild global hypokinesis. She had received duonbes as well for her asthma. Currently she is sinus tach and her symptoms are better. She was also given IV lasix for mild CHF exacerbation.   History of Present Illness:    Past Medical History:  Diagnosis Date   Acute renal failure ([place]) 05/27/10   hemodialysis for 6 weeks   Anxiety    Asthma    Depression    Diabetes mellitus    Hypertension    Rhabdomyolysis 06/06/10   after drug overdose   Suicide attempt by drug ingestion ([place]) 06/05/10   result rhabdomyolosis and ARF requrining dialysis     Past Surgical History:  Procedure Laterality Date   ANKLE ARTHROSCOPY Right 08/17/2015   Procedure: RIGHT ANKLE ARTHROSCOPY WITH SYNOVECTOMY AND LOOSE BODY EXCISION;  Surgeon: Melrose Nakayama, MD;  Location: [facility];  Service: Orthopedics;  Laterality: Right;   CHOLECYSTECTOMY      TUBAL LIGATION  1998       Inpatient Medications: Scheduled Meds:  adenosine (ADENOCARD) IV  6 mg Intravenous Once   enoxaparin (LOVENOX) injection  40 mg Subcutaneous Q24H   famotidine  20 mg Oral Daily   FLUoxetine  80 mg Oral Daily   fluticasone  1 spray Each Nare Daily   fluticasone furoate-vilanterol  1 puff Inhalation Daily   gabapentin  300 mg Oral TID   insulin aspart  0-20 Units Subcutaneous TID WC   insulin glargine-yfgn  60 Units Subcutaneous BID   levothyroxine  300 mcg Oral Daily   metoprolol succinate  25 mg Oral Daily   mirtazapine  30 mg Oral Daily   rosuvastatin  10 mg Oral Daily   sodium zirconium cyclosilicate  10 g Oral Once   Continuous Infusions:  PRN Meds: acetaminophen, calcium carbonate, ipratropium, levalbuterol  Allergies:    Allergies  Allergen Reactions   Ace Inhibitors Other (See Comments)    Patient had acute renal failure requiring hemodialysis after a suicide attempt.  Nephro recommended avoiding use.   Haldol [Haloperidol Lactate] Other (See Comments)    Tardive dyskinesia   Nsaids Other (See Comments)    Nephro recommended avoiding after acute renal failure requiring hemodialysis associated with a suicide attempt.   Latex Other (See Comments)    Rash around IV site    Social History:  Social History   Socioeconomic History   Marital status: Married    Spouse name: Not on file   Number of children: 2   Years of education: Not on file   Highest education level: Not on file  Occupational History   Occupation: Corporate treasurer  Tobacco Use   Smoking status: Never   Smokeless tobacco: Never  Vaping Use   Vaping Use: Never used  Substance and Sexual Activity   Alcohol use: No   Drug use: No   Sexual activity: Yes    Comment: with husband   Other Topics Concern   Not on file  Social History Narrative   Married high school boyfriend at age 18, two boys, still married but he has been living with other women for years.   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  All other ROS reviewed and negative.     Physical Exam/Data:   Vitals:   05/19/21 2000 05/19/21 2100 05/20/21 0014 05/20/21 0418  BP: (!) 125/94 (!) 142/100 131/81 116/79  Pulse: (!) 110 (!) 107 (!) 101 90  Resp: (!) 37 18 20 13   Temp:  98.9 F (37.2 C) 98.9 F (37.2 C) 97.7 F (36.5 C)  TempSrc:  Oral Oral Oral  SpO2: 97% 98% 96% 94%  Weight:      Height:        Intake/Output Summary (Last 24 hours) at 05/20/2021 0556 Last data filed at 05/20/2021 0400 Gross per 24 hour  Intake 500 ml  Output --  Net 500 ml   Last 3 Weights 05/19/2021 05/02/2021 04/19/2021  Weight (lbs) 387 lb 387 lb 3.2 oz 411 lb 4 oz  Weight (kg) 175.542 kg 175.633 kg 186.542 kg     Body mass index is 53.98 kg/m.  General:  Well nourished, well developed, in no acute distress HEENT: normal Neck: no JVD Vascular: No carotid bruits; Distal pulses 2+ bilaterally Cardiac:  normal S1, S2; tachycardic but regular Lungs:  Poor airflow. Has wheezes Abd: soft, nontender, no hepatomegaly  Ext: mild leg edema Musculoskeletal:  No deformities, BUE and BLE strength normal and equal Skin: warm and dry  Neuro:  CNs 2-12 intact, no focal abnormalities noted Psych:  Normal affect   EKG:  The EKG was personally reviewed and demonstrates:  Sinus tach   Relevant CV Studies:  Echo 2019:  Study Conclusions   - Left ventricle: The cavity size was normal. There was mild    concentric hypertrophy. Systolic function  was mildly reduced. The    estimated ejection fraction was in the range of 45% to 50%. Mild    diffuse hypokinesis with no identifiable regional variations.    There was an increased relative contribution of atrial    contraction to ventricular filling. Doppler parameters are    consistent with abnormal left ventricular relaxation (grade 1    diastolic dysfunction).  - Left atrium: The atrium was  moderately dilated.  - Atrial septum: There was increased thickness of the septum,    consistent with lipomatous hypertrophy.  - Pulmonary arteries: Systolic pressure could not be accurately    estimated.   Laboratory Data:  High Sensitivity Troponin:   Recent Labs  Lab 05/19/21 1243 05/19/21 1456  TROPONINIHS 17 18*     Chemistry Recent Labs  Lab 05/19/21 1243 05/20/21 0326  NA 135 132*  K 4.6 6.2*  CL 104 104  CO2 16* 17*  GLUCOSE 391* 509*  BUN 38* 43*  CREATININE 2.56* 2.41*  CALCIUM 8.5* 8.7*  MG 1.3* 1.9  GFRNONAA 22* 24*  ANIONGAP 15 11    Recent Labs  Lab 05/19/21 1243 05/20/21 0326  PROT 6.3* 6.9  ALBUMIN 3.1* 3.2*  AST 21 21  ALT 29 28  ALKPHOS 56 60  BILITOT 0.7 0.5   Lipids No results for input(s): CHOL, TRIG, HDL, LABVLDL, LDLCALC, CHOLHDL in the last 168 hours.  Hematology Recent Labs  Lab 05/19/21 1243 05/20/21 0326  WBC 7.6 7.9  RBC 3.51* 3.42*  HGB 8.6* 8.6*  HCT 29.2* 27.8*  MCV 83.2 81.3  MCH 24.5* 25.1*  MCHC 29.5* 30.9  RDW 16.0* 15.9*  PLT 392 397   Thyroid  Recent Labs  Lab 05/19/21 1243  TSH 2.689    BNP Recent Labs  Lab 05/19/21 1243  BNP 720.6*    DDimer No results for input(s): DDIMER in the last 168 hours.   Radiology/Studies:  [facility] RAD RESULTS  Result Date: 05/19/2021 Ordered by an unspecified provider.  [facility] RAD RESULTS  Result Date: 05/19/2021 Ordered by an unspecified provider.  DG Chest Port 1 View  Result Date: 05/19/2021 CLINICAL DATA:  Cough and shortness of breath. EXAM: PORTABLE CHEST 1 VIEW  COMPARISON:  04/25/2021 radiographs and prior studies FINDINGS: Cardiomegaly and mild pulmonary vascular congestion noted. Defibrillator/pacing pads overlying the LEFT chest noted. There is no evidence of focal airspace disease, pulmonary edema, suspicious pulmonary nodule/mass, pleural effusion, or pneumothorax. No acute bony abnormalities are identified. IMPRESSION: Cardiomegaly with mild pulmonary vascular congestion. Electronically Signed   By: Margarette Canada M.D.   On: 05/19/2021 14:04   US Abdomen Limited RUQ (LIVER/GB)  Result Date: 05/19/2021 CLINICAL DATA:  assisting pt with increased heart rate and breathing. Will check back. 3/12 at 5:20pm. EXAM: ULTRASOUND ABDOMEN LIMITED RIGHT UPPER QUADRANT COMPARISON:  None. FINDINGS: Gallbladder: Status post cholecystectomy. Common bile duct: Diameter: 4 mm. Liver: No focal lesion identified. Increased parenchymal echogenicity. Portal vein is patent on color Doppler imaging with normal direction of blood flow towards the liver. Other: None. IMPRESSION: 1. Hepatic steatosis. Please note limited evaluation for focal hepatic masses in a patient with hepatic steatosis due to decreased penetration of the acoustic ultrasound waves. 2. Status post cholecystectomy. Electronically Signed   By: Iven Finn M.D.   On: 05/19/2021 18:53     Assessment and Plan:   # SVT # Mildly reduced EF # AKI on CKD Stage 3  -Her SVT might have been triggered by albuterol/asthma  or HF or electrolyte abnormalities. Her TSH is WNL -Of note Mg was 1.3 in the ED- please keep Mg >2 -SVT corrected with adenosine. Continue to monitor on tele -Please only use ipratotium; dc albuterol as maybe triggering SVT -Please get an Echo in AM -If EF relatively normal, we can consider CCB PRN for SVT -BNP is 700- agree with treating IV Lasix 20 BID -Strict I/O and weights -Monitor Cr closely  For questions  or updates, please contact [place] Please consult [email or web address] for contact  info under    Signed, Jaci Lazier, MD  05/20/2021 5:56 AM

## 2021-05-20 NOTE — Progress Notes (Signed)
VASCULAR LAB ? ? ? ?Left lower extremity venous duplex has been performed. ? ?See CV proc for preliminary results. ? ? ?Xee Hollman, RVT ?05/20/2021, 10:28 AM ? ?

## 2021-05-20 NOTE — Progress Notes (Signed)
? ? ?Subjective:  ?Mild cough no cardiac symptoms  ? ?Objective:  ?Vitals:  ? 05/19/21 2100 05/20/21 0014 05/20/21 0418 05/20/21 0726  ?BP: (!) 142/100 131/81 116/79 131/89  ?Pulse: (!) 107 (!) 101 90 94  ?Resp: 18 20 13 20   ?Temp: 98.9 ?F (37.2 ?C) 98.9 ?F (37.2 ?C) 97.7 ?F (36.5 ?C) 97.6 ?F (36.4 ?C)  ?TempSrc: Oral Oral Oral Oral  ?SpO2: 98% 96% 94% 97%  ?Weight:      ?Height:      ? ? ?Intake/Output from previous day: ? ?Intake/Output Summary (Last 24 hours) at 05/20/2021 0836 ?Last data filed at 05/20/2021 0700 ?Gross per 24 hour  ?Intake 500 ml  ?Output 300 ml  ?Net 200 ml  ? ? ?Physical Exam: ?Morbidly obese female ?Lungs mild end exp wheezes  ?Distant heart sounds ?Abdomen benign ?Plus one LE edema  ? ?Lab Results: ?Basic Metabolic Panel: ?Recent Labs  ?  05/19/21 ?3009 05/20/21 ?0326 05/20/21 ?2330  ?NA 135 132*  --   ?K 4.6 6.2* 5.6*  ?CL 104 104  --   ?CO2 16* 17*  --   ?GLUCOSE 391* 509*  --   ?BUN 38* 43*  --   ?CREATININE 2.56* 2.41*  --   ?CALCIUM 8.5* 8.7*  --   ?MG 1.3* 1.9  --   ? ?Liver Function Tests: ?Recent Labs  ?  05/19/21 ?1243 05/20/21 ?0326  ?AST 21 21  ?ALT 29 28  ?ALKPHOS 56 60  ?BILITOT 0.7 0.5  ?PROT 6.3* 6.9  ?ALBUMIN 3.1* 3.2*  ? ?No results for input(s): LIPASE, AMYLASE in the last 72 hours. ?CBC: ?Recent Labs  ?  05/19/21 ?1243 05/20/21 ?0326  ?WBC 7.6 7.9  ?NEUTROABS 5.1 7.0  ?HGB 8.6* 8.6*  ?HCT 29.2* 27.8*  ?MCV 83.2 81.3  ?PLT 392 397  ? ? ?Thyroid Function Tests: ?Recent Labs  ?  05/19/21 ?1243  ?TSH 2.689  ? ?Anemia Panel: ?Recent Labs  ?  05/20/21 ?0633  ?FERRITIN 10*  ?TIBC 410  ?IRON 14*  ? ? ?Imaging: ?[facility] RAD RESULTS ? ?Result Date: 05/19/2021 ?Ordered by an unspecified provider. ? ?[facility] RAD RESULTS ? ?Result Date: 05/19/2021 ?Ordered by an unspecified provider. ? ?DG Chest Port 1 View ? ?Result Date: 05/19/2021 ?CLINICAL DATA:  Cough and shortness of breath. EXAM: PORTABLE CHEST 1 VIEW COMPARISON:  04/25/2021 radiographs and prior studies FINDINGS: Cardiomegaly and mild  pulmonary vascular congestion noted. Defibrillator/pacing pads overlying the LEFT chest noted. There is no evidence of focal airspace disease, pulmonary edema, suspicious pulmonary nodule/mass, pleural effusion, or pneumothorax. No acute bony abnormalities are identified. IMPRESSION: Cardiomegaly with mild pulmonary vascular congestion. Electronically Signed   By: Margarette Canada M.D.   On: 05/19/2021 14:04  ? ?US Abdomen Limited RUQ (LIVER/GB) ? ?Result Date: 05/19/2021 ?CLINICAL DATA:  assisting pt with increased heart rate and breathing. Will check back. 3/12 at 5:20pm. EXAM: ULTRASOUND ABDOMEN LIMITED RIGHT UPPER QUADRANT COMPARISON:  None. FINDINGS: Gallbladder: Status post cholecystectomy. Common bile duct: Diameter: 4 mm. Liver: No focal lesion identified. Increased parenchymal echogenicity. Portal vein is patent on color Doppler imaging with normal direction of blood flow towards the liver. Other: None. IMPRESSION: 1. Hepatic steatosis. Please note limited evaluation for focal hepatic masses in a patient with hepatic steatosis due to decreased penetration of the acoustic ultrasound waves. 2. Status post cholecystectomy. Electronically Signed   By: Iven Finn M.D.   On: 05/19/2021 18:53   ? ?Cardiac Studies: ? ECG: SR rate 94 QT 394  msec ? ? Telemetry:  NSR 05/20/2021  ? Echo: pending  ? ?Medications: ?  ? adenosine (ADENOCARD) IV  6 mg Intravenous Once  ? enoxaparin (LOVENOX) injection  40 mg Subcutaneous Q24H  ? famotidine  20 mg Oral Daily  ? FLUoxetine  80 mg Oral Daily  ? fluticasone  1 spray Each Nare Daily  ? fluticasone furoate-vilanterol  1 puff Inhalation Daily  ? gabapentin  300 mg Oral TID  ? insulin aspart  0-20 Units Subcutaneous TID WC  ? insulin aspart  10 Units Subcutaneous Once  ? insulin glargine-yfgn  60 Units Subcutaneous BID  ? levothyroxine  300 mcg Oral Daily  ? metoprolol succinate  25 mg Oral Daily  ? mirtazapine  30 mg Oral Daily  ? rosuvastatin  10 mg Oral Daily  ? ?  ? magnesium  sulfate bolus IVPB    ? ? ?Assessment/Plan:  ? ?SVT:  resolved with adenosine related to electrolyte abnormalities and asthma start low dose cardizem TTE pending acoustic windows not likely to be good given morbid obesity  ? ?[name] ?05/20/2021, 8:36 AM ? ? ? ?

## 2021-05-20 NOTE — Evaluation (Signed)
Physical Therapy Evaluation ?Patient Details ?Name: [name] ?MRN: [id number] ?DOB: 07-26-1970 ?Today's Date: 05/20/2021 ? ?History of Present Illness ? Patient is a 51 y/o female who presents with SOB on 05/19/21; found to have SVT and volume overload secondary to CHF exacerbation. Recent admission in 04/2021 with multifocal PNA and asthma exacerbation , which pt reports she never fully recovered from. PMH includes HTN, DM, anxiety, depression, suicide attempt, CKD  ?Clinical Impression ? Patient presents with dyspnea on exertion, decreased cardiovascular endurance and impaired activity tolerance s/p above. Pt independent and lives with spouse PTA; able to negotiate flights of stairs at baseline but reports she had to rest after each flight and needing help with dressing the day before admission due to SOB. 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 Today, pt able to walk to/from bathroom furniture walking and noted to have 3/4 DOE, deferring further mobility. Sp02 in mig-high 90s on RA. Encouraged continued walks to bathroom. Will follow to maximize independence and mobility prior to return home.  Likely will need no follow up pending improvement in breathing. ?   ? ?Recommendations for follow up therapy are one component of a multi-disciplinary discharge planning process, led by the attending physician.  Recommendations may be updated based on patient status, additional functional criteria and insurance authorization. ? ?Follow Up Recommendations No PT follow up ? ?  ?Assistance Recommended at Discharge PRN  ?Patient can return home with the following ? Assistance with cooking/housework;Help with stairs or ramp for entrance;A little help with bathing/dressing/bathroom ? ?  ?Equipment Recommendations None recommended by PT  ?Recommendations for Other Services ?    ?  ?Functional Status Assessment Patient has had a recent decline in their functional status and demonstrates the ability to make significant  improvements in function in a reasonable and predictable amount of time.  ? ?  ?Precautions / Restrictions Precautions ?Precautions: Other (comment) ?Precaution Comments: watch 02 ?Restrictions ?Weight Bearing Restrictions: No  ? ?  ? ?Mobility ? Bed Mobility ?Overal bed mobility: Modified Independent ?Bed Mobility: Sidelying to Sit ?  ?Sidelying to sit: Modified independent (Device/Increase time), HOB elevated ?  ?  ?  ?  ?  ? ?Transfers ?Overall transfer level: Modified independent ?Equipment used: None ?  ?  ?  ?  ?  ?  ?  ?General transfer comment: Stood from EOB x1. ?  ? ?Ambulation/Gait ?Ambulation/Gait assistance: Modified independent (Device/Increase time) ?Gait Distance (Feet): 14 Feet (x2 bouts) ?Assistive device: None ?Gait Pattern/deviations: Step-through pattern, Decreased stride length ?Gait velocity: decreased ?  ?  ?General Gait Details: Slow, mostly steady gait reaching for furniture for support due to SOB. 3/4 DOE. 1 seated rest break on toilet, declined further ambiulation due to SOB. ? ?Stairs ?  ?  ?  ?  ?  ? ?Wheelchair Mobility ?  ? ?Modified Rankin (Stroke Patients Only) ?  ? ?  ? ?Balance Overall balance assessment: Mild deficits observed, not formally tested ?  ?  ?  ?  ?  ?  ?  ?  ?  ?  ?  ?  ?  ?  ?  ?  ?  ?  ?   ? ? ? ?Pertinent Vitals/Pain Pain Assessment ?Pain Assessment: No/denies pain  ? ? ?Home Living Family/patient expects to be discharged to:: Private residence ?Living Arrangements: Spouse/significant other ?Available Help at Discharge: Family ?Type of Home: House ?Home Access: Level entry ?  ?  ?Alternate Level Stairs-Number of Steps: stairs ?Home  Layout: Multi-level ?Home Equipment: None ?   ?  ?Prior Function Prior Level of Function : Independent/Modified Independent ?  ?  ?  ?  ?  ?  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 had to take  seated rest breaks after each flight of stairs ?  ? ? ?Hand Dominance  ? Dominant Hand: Right ? ?  ?Extremity/Trunk Assessment  ? Upper Extremity Assessment ?Upper Extremity Assessment: Defer to OT evaluation ?  ? ?Lower Extremity Assessment ?Lower Extremity Assessment: RLE deficits/detail;LLE deficits/detail ?RLE Sensation: history of peripheral neuropathy (feet) ?LLE Sensation: history of peripheral neuropathy (feet) ?  ? ?Cervical / Trunk Assessment ?Cervical / Trunk Assessment: Normal  ?Communication  ? Communication: No difficulties  ?Cognition Arousal/Alertness: Awake/alert ?Behavior During Therapy: [facility] for tasks assessed/performed ?Overall Cognitive Status: Within Functional Limits for tasks assessed ?  ?  ?  ?  ?  ?  ?  ?  ?  ?  ?  ?  ?  ?  ?  ?  ?  ?  ?  ? ?  ?General Comments General comments (skin integrity, edema, etc.): Spouse present at end of session. Sp02 remained in mid-high 90s on RA. ? ?  ?Exercises    ? ?Assessment/Plan  ?  ?PT Assessment Patient needs continued PT services  ?PT Problem List Decreased activity tolerance;Cardiopulmonary status limiting activity;Decreased mobility ? ?   ?  ?PT Treatment Interventions Therapeutic exercise;Gait training;Patient/family education;Therapeutic activities;Functional mobility training;Stair training;Balance training   ? ?PT Goals (Current goals can be found in the Care Plan section)  ?Acute Rehab PT Goals ?Patient Stated Goal: be able to breathe and feel better ?PT Goal Formulation: With patient ?Time For Goal Achievement: 06/03/21 ?Potential to Achieve Goals: Fair ? ?  ?Frequency Min 3X/week ?  ? ? ?Co-evaluation   ?  ?  ?  ?  ? ? ?  ?AM-PAC PT "6 Clicks" Mobility  ?Outcome Measure Help needed turning from your back to your side while in a flat bed without using bedrails?: None ?Help needed moving from lying on your back to sitting on the side of a flat bed without using bedrails?: None ?Help needed moving to and from a bed to a chair (including a  wheelchair)?: A Little ?Help needed standing up from a chair using your arms (e.g., wheelchair or bedside chair)?: A Little ?Help needed to walk in hospital room?: A Little ?Help needed climbing 3-5 steps with a railing? : A Little ?6 Click Score: 20 ? ?  ?End of Session   ?Activity Tolerance: Patient limited by fatigue;Other (comment) (SOB) ?Patient left: in bed;with call bell/phone within reach;with family/visitor present ?Nurse Communication: Mobility status ?PT Visit Diagnosis: Difficulty in walking, not elsewhere classified (R26.2);Other (comment) (DOE) ?  ? ?Time: 4503-8882 ?PT Time Calculation (min) (ACUTE ONLY): 18 min ? ? ?Charges:   PT Evaluation ?$PT Eval Low Complexity: 1 Low ?  ?  ?   ? ? ?Marisa Severin, PT, DPT ?Acute Rehabilitation Services ?Pager [phone] ?Office [phone] ? ? ? ? ?[place] ?05/20/2021, 10:09 AM ? ?

## 2021-05-21 ENCOUNTER — Other Ambulatory Visit (HOSPITAL_COMMUNITY): Payer: Self-pay

## 2021-05-21 DIAGNOSIS — I509 Heart failure, unspecified: Secondary | ICD-10-CM

## 2021-05-21 DIAGNOSIS — I5022 Chronic systolic (congestive) heart failure: Secondary | ICD-10-CM

## 2021-05-21 LAB — BASIC METABOLIC PANEL
Anion gap: 12 (ref 5–15)
BUN: 45 mg/dL — ABNORMAL HIGH (ref 6–20)
CO2: 19 mmol/L — ABNORMAL LOW (ref 22–32)
Calcium: 9.1 mg/dL (ref 8.9–10.3)
Chloride: 108 mmol/L (ref 98–111)
Creatinine, Ser: 2.31 mg/dL — ABNORMAL HIGH (ref 0.44–1.00)
GFR, Estimated: 25 mL/min — ABNORMAL LOW (ref >60)
Glucose, Bld: 137 mg/dL — ABNORMAL HIGH (ref 70–99)
Potassium: 4.5 mmol/L (ref 3.5–5.1)
Sodium: 139 mmol/L (ref 135–145)

## 2021-05-21 LAB — CBC
HCT: 31.5 % — ABNORMAL LOW (ref 36.0–46.0)
Hemoglobin: 9.5 g/dL — ABNORMAL LOW (ref 12.0–15.0)
MCH: 24.7 pg — ABNORMAL LOW (ref 26.0–34.0)
MCHC: 30.2 g/dL (ref 30.0–36.0)
MCV: 82 fL (ref 80.0–100.0)
Platelets: 515 10*3/uL — ABNORMAL HIGH (ref 150–400)
RBC: 3.84 MIL/uL — ABNORMAL LOW (ref 3.87–5.11)
RDW: 16.1 % — ABNORMAL HIGH (ref 11.5–15.5)
WBC: 12.3 10*3/uL — ABNORMAL HIGH (ref 4.0–10.5)
nRBC: 0.6 % — ABNORMAL HIGH (ref 0.0–0.2)

## 2021-05-21 LAB — GLUCOSE, CAPILLARY
Glucose-Capillary: 107 mg/dL — ABNORMAL HIGH (ref 70–99)
Glucose-Capillary: 135 mg/dL — ABNORMAL HIGH (ref 70–99)

## 2021-05-21 LAB — PHOSPHORUS: Phosphorus: 3.6 mg/dL (ref 2.5–4.6)

## 2021-05-21 LAB — MAGNESIUM: Magnesium: 2 mg/dL (ref 1.7–2.4)

## 2021-05-21 MED ORDER — FUROSEMIDE 40 MG PO TABS: 40.0000 mg | ORAL_TABLET | Freq: Two times a day (BID) | ORAL | Status: DC

## 2021-05-21 MED ORDER — BUDESONIDE-FORMOTEROL FUMARATE 80-4.5 MCG/ACT IN AERO
2.0000 | INHALATION_SPRAY | Freq: Two times a day (BID) | RESPIRATORY_TRACT | 12 refills | Status: DC
  Filled 2021-05-21: qty 10.2, 30d supply, fill #0

## 2021-05-21 MED ORDER — ALBUTEROL SULFATE HFA 108 (90 BASE) MCG/ACT IN AERS
2.0000 | INHALATION_SPRAY | RESPIRATORY_TRACT | 0 refills | Status: DC | PRN
  Filled 2021-05-21: qty 18, 17d supply, fill #0

## 2021-05-21 MED ORDER — ALBUTEROL SULFATE (2.5 MG/3ML) 0.083% IN NEBU
3.0000 mL | INHALATION_SOLUTION | Freq: Three times a day (TID) | RESPIRATORY_TRACT | 0 refills | Status: DC | PRN
  Filled 2021-05-21: qty 150, 17d supply, fill #0

## 2021-05-21 MED ORDER — ISOSORB DINITRATE-HYDRALAZINE 20-37.5 MG PO TABS
1.0000 | ORAL_TABLET | Freq: Three times a day (TID) | ORAL | Status: DC
  Administered 2021-05-21: 1 via ORAL
  Filled 2021-05-21: qty 1

## 2021-05-21 MED ORDER — METOPROLOL TARTRATE 25 MG PO TABS
25.0000 mg | ORAL_TABLET | Freq: Two times a day (BID) | ORAL | 0 refills | Status: DC
  Filled 2021-05-21: qty 60, 30d supply, fill #0

## 2021-05-21 MED ORDER — FUROSEMIDE 40 MG PO TABS
40.0000 mg | ORAL_TABLET | Freq: Two times a day (BID) | ORAL | 0 refills | Status: DC
  Filled 2021-05-21: qty 60, 30d supply, fill #0

## 2021-05-21 MED ORDER — ISOSORB DINITRATE-HYDRALAZINE 20-37.5 MG PO TABS
1.0000 | ORAL_TABLET | Freq: Three times a day (TID) | ORAL | 0 refills | Status: DC
  Filled 2021-05-21: qty 90, 30d supply, fill #0

## 2021-05-21 NOTE — Progress Notes (Signed)
Mobility Specialist Progress Note ? ? 05/21/21 1346  ?Mobility  ?Activity Ambulated with assistance in hallway  ?Level of Assistance Independent  ?Assistive Device None  ?Distance Ambulated (ft) 360 ft  ?Activity Response Tolerated well  ?$Mobility charge 1 Mobility  ? ?Received pt in doorway w/ no complaints and agreeable. X1 standing rest break d/t SOB but recovered w/ pursed lip breathing, SpO2 >/=92% throughout. Returned back to room in prep for d/c home. ? ?During Mobility: 103 HR, BP, 92% SpO2 on RA ?Post Mobility: 91 HR, 94% SpO2 on RA ? ?Holland Falling ?Mobility Specialist ?Phone Number [phone] ? ?

## 2021-05-21 NOTE — Care Management (Signed)
Confirmed with patient that she has a working nebulizer at home. No other TOC needs identified for discharge ?

## 2021-05-21 NOTE — Progress Notes (Signed)
PT Cancellation Note ? ?Patient Details ?Name: [name] ?MRN: [id number] ?DOB: Apr 28, 1970 ? ? ?Cancelled Treatment:    Reason Eval/Treat Not Completed: Patient declined, no reason specified Pt sitting EOB, declining working with therapy this AM, "I want to wash up first, can you come back later?" Will follow. ? ? ?[place] ?05/21/2021, 9:37 AM ?Marisa Severin, PT, DPT ?Acute Rehabilitation Services ?Pager [phone] ?Office [phone] ? ? ? ?

## 2021-05-21 NOTE — Progress Notes (Signed)
Physical Therapy Treatment ?Patient Details ?Name: [name] ?MRN: [id number] ?DOB: [birth year] ?Today's Date: 05/21/2021 ? ? ?History of Present Illness 51 y/o female who presents with SOB on 05/19/21; found to have SVT and volume overload secondary to CHF exacerbation. Recent admission in 04/2021 with multifocal PNA and asthma exacerbation , which pt reports she never fully recovered from. PMH includes HTN, DM, anxiety, depression, suicide attempt, CKD ? ?  ?PT Comments  ? ? Patient reports feeling better today. Session focused on progressive ambulation, holding onto IV pole for support however does not need it. VSS on RA with HR ranging from 90-100 bpm and Sp02 in high 90s on RA. Noted to have 2/4 DOE but nor est breaks needed during mobility. Education on importance of mobility and walking in hallway a few times daily as well as energy conservation techniques. Pt is functioning at about 70% of baseline, per report and has support of spouse at home. Does not require skilled therapy services. Recommend continuing to work with Risk analyst. All education completed and discharge from therapy.  ?  ?Recommendations for follow up therapy are one component of a multi-disciplinary discharge planning process, led by the attending physician.  Recommendations may be updated based on patient status, additional functional criteria and insurance authorization. ? ?Follow Up Recommendations ? No PT follow up ?  ?  ?Assistance Recommended at Discharge PRN  ?Patient can return home with the following Assistance with cooking/housework;Help with stairs or ramp for entrance;A little help with bathing/dressing/bathroom ?  ?Equipment Recommendations ? None recommended by PT  ?  ?Recommendations for Other Services   ? ? ?  ?Precautions / Restrictions Precautions ?Precautions: None ?Precaution Comments: watch 02 ?Restrictions ?Weight Bearing Restrictions: No  ?  ? ?Mobility ? Bed Mobility ?  ?  ?  ?  ?  ?  ?  ?General bed mobility  comments: Standing inr oom upon PT arrival. ?  ? ?Transfers ?Overall transfer level: Modified independent ?  ?  ?  ?  ?  ?  ?  ?  ?  ?  ? ?Ambulation/Gait ?Ambulation/Gait assistance: Modified independent (Device/Increase time) ?Gait Distance (Feet): 470 Feet ?Assistive device: IV Pole ?Gait Pattern/deviations: Step-through pattern, Decreased stride length, Wide base of support ?  ?Gait velocity interpretation: 1.31 - 2.62 ft/sec, indicative of limited community ambulator ?  ?General Gait Details: SLow steady gait holding onto IV pole for support, more for convenience (does not need it). No rest breaks needed. 2/4 DOE. HR 90-100 bpm, Sp02 high 90s on RA. ? ? ?Stairs ?  ?  ?  ?  ?  ? ? ?Wheelchair Mobility ?  ? ?Modified Rankin (Stroke Patients Only) ?  ? ? ?  ?Balance Overall balance assessment: No apparent balance deficits (not formally assessed) ?  ?  ?  ?  ?  ?  ?  ?  ?  ?  ?  ?  ?  ?  ?  ?  ?  ?  ?  ? ?  ?Cognition Arousal/Alertness: Awake/alert ?Behavior During Therapy: [facility] for tasks assessed/performed ?Overall Cognitive Status: Within Functional Limits for tasks assessed ?  ?  ?  ?  ?  ?  ?  ?  ?  ?  ?  ?  ?  ?  ?  ?  ?  ?  ?  ? ?  ?Exercises   ? ?  ?General Comments General comments (skin integrity, edema, etc.): VSS on RA. ?  ?  ? ?  Pertinent Vitals/Pain Pain Assessment ?Pain Assessment: No/denies pain  ? ? ?Home Living Family/patient expects to be discharged to:: Private residence ?Living Arrangements: Spouse/significant other ?Available Help at Discharge: Family ?Type of Home: House ?Home Access: Level entry ?  ?  ?Alternate Level Stairs-Number of Steps: stairs ?Home Layout: Multi-level ?Home Equipment: None ?   ?  ?Prior Function    ?  ?  ?   ? ?PT Goals (current goals can now be found in the care plan section) Progress towards PT goals: Goals met/education completed, patient discharged from PT ? ?  ?Frequency ? ? ? Min 3X/week ? ? ? ?  ?PT Plan Current plan remains appropriate  ? ? ?Co-evaluation   ?  ?   ?  ?  ? ?  ?AM-PAC PT "6 Clicks" Mobility   ?Outcome Measure ? Help needed turning from your back to your side while in a flat bed without using bedrails?: None ?Help needed moving from lying on your back to sitting on the side of a flat bed without using bedrails?: None ?Help needed moving to and from a bed to a chair (including a wheelchair)?: None ?Help needed standing up from a chair using your arms (e.g., wheelchair or bedside chair)?: None ?Help needed to walk in hospital room?: None ?Help needed climbing 3-5 steps with a railing? : A Little ?6 Click Score: 23 ? ?  ?End of Session   ?Activity Tolerance: Patient tolerated treatment well ?Patient left: in chair;with call bell/phone within reach ?Nurse Communication: Mobility status ?PT Visit Diagnosis: Difficulty in walking, not elsewhere classified (R26.2);Other (comment) (DOE) ?  ? ? ?Time: 0960-4540 ?PT Time Calculation (min) (ACUTE ONLY): 12 min ? ?Charges:  $Therapeutic Exercise: 8-22 mins          ?          ? ?Marisa Severin, PT, DPT ?Acute Rehabilitation Services ?Pager [phone] ?Office [phone] ? ? ? ? ? ?A[place] ?05/21/2021, 1:08 PM ? ?

## 2021-05-21 NOTE — Progress Notes (Signed)
FPTS Interim Night Progress Note ? ?S:Patient sleeping comfortably.  Rounded with primary night RN. No further episodes of SVT overnight. CBG's improved.  No concerns voiced.  No orders required.   ? ?O: ?Today's Vitals  ? 05/20/21 2041 05/20/21 2307 05/21/21 0259 05/21/21 0300  ?BP: 137/90 (!) 141/98 (!) 128/101   ?Pulse: 94 91 86   ?Resp: 18 14 18  (!) 25  ?Temp: 98.6 ?F (37 ?C) 98.7 ?F (37.1 ?C) 98.9 ?F (37.2 ?C)   ?TempSrc: Oral Oral Oral   ?SpO2: 97% 98% 95%   ?Weight:    (!) 183.8 kg  ?Height:      ?PainSc: 0-No pain     ? ? ? ? ?A/P: ?Continue current management ? ?Carollee Leitz MD ?PGY-3, [facility] ?Service pager [phone]   ?

## 2021-05-21 NOTE — Evaluation (Signed)
Occupational Therapy Evaluation ?Patient Details ?Name: [name] ?MRN: [id number] ?DOB: May 17, 1970 ?Today's Date: 05/21/2021 ? ? ?History of Present Illness 51 y/o female who presents with SOB on 05/19/21; found to have SVT and volume overload secondary to CHF exacerbation. Recent admission in 04/2021 with multifocal PNA and asthma exacerbation , which pt reports she never fully recovered from. PMH includes HTN, DM, anxiety, depression, suicide attempt, CKD  ? ?Clinical Impression ?  ?PTA, pt was living with her husband and was independent; works as a Immunologist. Currently, pt performing ADLs and functional mobility at Mod I level with increased time as needed. Provided education and handout on energy conservation for ADLs and IADLs; pt verbalized understanding. Answered all pt questions. Recommend dc home once medically stable per physician. All acute OT needs met and will sign off. Thank you.  ? ?Recommendations for follow up therapy are one component of a multi-disciplinary discharge planning process, led by the attending physician.  Recommendations may be updated based on patient status, additional functional criteria and insurance authorization.  ? ?Follow Up Recommendations ? No OT follow up  ?  ?Assistance Recommended at Discharge PRN  ?Patient can return home with the following   ? ?  ?Functional Status Assessment ?    ?Equipment Recommendations ? None recommended by OT  ?  ?Recommendations for Other Services PT consult ? ? ?  ?Precautions / Restrictions Precautions ?Precautions: Other (comment) ?Precaution Comments: watch 02 ?Restrictions ?Weight Bearing Restrictions: No  ? ?  ? ?Mobility Bed Mobility ?Overal bed mobility: Modified Independent ?Bed Mobility: Supine to Sit ?  ?  ?  ?  ?  ?  ?  ? ?Transfers ?Overall transfer level: Modified independent ?Equipment used: None ?  ?  ?  ?  ?  ?  ?  ?  ?  ? ?  ?Balance Overall balance assessment: No apparent balance deficits (not formally  assessed) ?  ?  ?  ?  ?  ?  ?  ?  ?  ?  ?  ?  ?  ?  ?  ?  ?  ?  ?   ? ?ADL either performed or assessed with clinical judgement  ? ?ADL Overall ADL's : Modified independent ?  ?  ?  ?  ?  ?  ?  ?  ?  ?  ?  ?  ?  ?  ?  ?  ?  ?  ?  ?General ADL Comments: Increased time due to fatigue. Pt adjusting her socks at EOB, toileting, and grooming at sink. No difficulties.  ? ? ? ?Vision   ?   ?   ?Perception   ?  ?Praxis   ?  ? ?Pertinent Vitals/Pain Pain Assessment ?Pain Assessment: No/denies pain  ? ? ? ?Hand Dominance Right ?  ?Extremity/Trunk Assessment Upper Extremity Assessment ?Upper Extremity Assessment: Overall WFL for tasks assessed ?  ?Lower Extremity Assessment ?Lower Extremity Assessment: Defer to PT evaluation ?RLE Sensation: history of peripheral neuropathy (feet) ?LLE Sensation: history of peripheral neuropathy (feet) ?  ?Cervical / Trunk Assessment ?Cervical / Trunk Assessment: Normal ?  ?Communication Communication ?Communication: No difficulties ?  ?Cognition Arousal/Alertness: Awake/alert ?Behavior During Therapy: [facility] for tasks assessed/performed ?Overall Cognitive Status: Within Functional Limits for tasks assessed ?  ?  ?  ?  ?  ?  ?  ?  ?  ?  ?  ?  ?  ?  ?  ?  ?  ?  ?  ?  General Comments  VSS on RA ? ?  ?Exercises   ?  ?Shoulder Instructions    ? ? ?Home Living Family/patient expects to be discharged to:: Private residence ?Living Arrangements: Spouse/significant other ?Available Help at Discharge: Family ?Type of Home: House ?Home Access: Level entry ?  ?  ?Home Layout: Multi-level ?Alternate Level Stairs-Number of Steps: stairs ?Alternate Level Stairs-Rails: Right ?Bathroom Shower/Tub: Tub/shower unit ?  ?Bathroom Toilet: Handicapped height ?  ?  ?Home Equipment: None ?  ?  ?  ? ?  ?Prior Functioning/Environment Prior Level of Function : Independent/Modified Independent ?  ?  ?  ?  ?  ?  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 reports  prior to this admission, pt had to help dress her due to SOB. had to take seated rest breaks after each flight of stairs ?  ? ?  ?  ?OT Problem List: Decreased strength;Decreased activity tolerance ?  ?   ?OT Treatment/Interventions:    ?  ?OT Goals(Current goals can be found in the care plan section) Acute Rehab OT Goals ?Patient Stated Goal: Go home ?OT Goal Formulation: All assessment and education complete, DC therapy  ?OT Frequency:   ?  ? ?Co-evaluation   ?  ?  ?  ?  ? ?  ?AM-PAC OT "6 Clicks" Daily Activity     ?Outcome Measure Help from another person eating meals?: None ?Help from another person taking care of personal grooming?: None ?Help from another person toileting, which includes using toliet, bedpan, or urinal?: None ?Help from another person bathing (including washing, rinsing, drying)?: None ?Help from another person to put on and taking off regular upper body clothing?: None ?Help from another person to put on and taking off regular lower body clothing?: None ?6 Click Score: 24 ?  ?End of Session Nurse Communication: Mobility status ? ?Activity Tolerance: Patient tolerated treatment well ?Patient left:  (In hallway with PT) ? ?OT Visit Diagnosis: Unsteadiness on feet (R26.81);Other abnormalities of gait and mobility (R26.89);Muscle weakness (generalized) (M62.81)  ?              ?Time: 1152-1202 ?OT Time Calculation (min): 10 min ?Charges:  OT General Charges ?$OT Visit: 1 Visit ?OT Evaluation ?$OT Eval Low Complexity: 1 Low ? ?Stanislaw Acton MSOT, OTR/L ?Acute Rehab ?Pager: [phone] ?Office: [phone] ? ?Reise Hietala M Akera Snowberger ?05/21/2021, 12:43 PM ?

## 2021-05-21 NOTE — Progress Notes (Signed)
?  Progress Note  ? ?Date: 05/21/2021 ? ?Patient Name: [name]        ?MRN#: [id number] ? ?Clarification of diagnosis: ? ?Acute systolic CHF ?ECHO shows: Left ventricular ejection fraction, by estimation, is 35 to 40%. The  ?left ventricle has moderately decreased function. The left ventricle demonstrates global hypokinesis. ?Appreciate cardiologist's recommendation. ?Outpatient sleep study recommended ?Transitioned to oral Lasix and initiated BiDil. ?Close outpatient PCP and Cards follow-up recommended. ? ? ? ? ?

## 2021-05-21 NOTE — Progress Notes (Signed)
?  Progress Note  ? ?Date: 05/21/2021 ? ?Patient Name: [name]        ?MRN#: [id number] ? ?Clarification of diagnosis: ? ?Asthma, likely moderate to severe persistent  ?Recommends outpatient PFT ? ? ? ? ?

## 2021-05-21 NOTE — Progress Notes (Signed)
 Family Medicine Teaching Service Daily Progress Note Intern Pager: [phone]  Patient name: [name] Medical record number: [id number] Date of birth: 03/16/1970 Age: 51 y.o. Gender: female  Primary Care Provider: Espinoza, Alejandra, DO Consultants: Cardiology Code Status: FULL  Pt Overview and Major Events to Date:  3/12: Admitted 3/13: Echo with reduced EF, 34-40%  Assessment and Plan: [name] is a 51 year old female who presented with SOB and SVT, improving with IV diuresis and resolution of SVT with no further episodes. PMH significant for asthma, HTN, HLD, T2DM, CKD, hypothyroidism, IDA.  Acute respiratory distress w/o hypoxia likely 2/2 HFrEF exacerbation Breathing improving, feeling better. Stable ORA with SpO2 >92%. Echo revealed reduced LVEF 34-40%, compared from prior at 45-50%. Diuresing well with IV Lasix  20 mg. UOP 2.7 L past 24 hours. Electrolytes stable and within goal range. Still appears to have some fluid overload on exam that would benefit from continued diuresis. - Continue IV Lasix  20mg  BID - Strict I/O - Monitor UOP - Monitor electrolytes and correct abnormalities as appropriate - Cardiology following, appreciate care  SVT, resolved No further epsiodes of SVT, in NSR this morning. TSH wnl. Likely 2/2 CHF vs electrolyte abnormalities. D/c'ed Toprol  XL 25mg  daily, and started metoprolol  succinate 25mg  BID per cardiology recommendations. - Metoprolol  succinate 25mg  BID - Cardiac monitoring - Mg >2  Hyperkalemia, resolved K+ stable this morning at 4.5. - Monitor with BMP  T2DM with long-term insulin  use Hyperglycemia improved significantly with increased insulin  regimen and mealtime coverage. CBGs in 100-200. - Semglee  65u BID - Resistant SSI - Novolog  8u BID with meals - CBG 4 times daily, at meals and bedtime  Acute on chronic CKD stage 3a Likely cardiorenal. Cr stable at 2.31. UA nml. - Monitor with labs - Avoid nephrotoxic  medications  IDA Hgb increased to 9.6 from 8.5 yesterday s/p Ferrlecit x1. Receiving dose #2 today. - Monitor with daily CBC  FEN/GI: Heart healthy/ carb modified diet PPx: Lovenox  Dispo:Pending PT recommendations  pending clinical improvement . Barriers include IV diuresis.   Subjective:  Feeling okay this morning. Breathing is good. Reports some anxiety and didn't sleep well, requesting some Ativan .  Objective: Temp:  [97.7 F (36.5 C)-98.9 F (37.2 C)] 97.7 F (36.5 C) (03/14 0745) Pulse Rate:  [84-94] 84 (03/14 0745) Resp:  [14-25] 20 (03/14 0745) BP: (125-144)/(68-101) 144/68 (03/14 0745) SpO2:  [95 %-98 %] 96 % (03/14 0745) Weight:  [183.8 kg] 183.8 kg (03/14 0300) Physical Exam: General: Laying in bed, comfortable, NAD Cardiovascular: RRR, HR in 90s Respiratory: Clear in all fields, although limited exam 2/2 body habitus. No wheezing or crackles. ORA Abdomen: Obese, soft, normal bowel sounds Extremities: Warm, dry, 1+ pitting edema bilaterally  Laboratory: Recent Labs  Lab 05/19/21 1243 05/20/21 0326 05/21/21 0544  WBC 7.6 7.9 12.3*  HGB 8.6* 8.6* 9.5*  HCT 29.2* 27.8* 31.5*  PLT 392 397 515*   Recent Labs  Lab 05/19/21 1243 05/20/21 0326 05/20/21 0633 05/20/21 1123 05/20/21 1806 05/21/21 0215  NA 135 132*  --  136 137 139  K 4.6 6.2*   < > 6.0* 5.0 4.5  CL 104 104  --  106 106 108  CO2 16* 17*  --  18* 18* 19*  BUN 38* 43*  --  44* 46* 45*  CREATININE 2.56* 2.41*  --  2.41* 2.30* 2.31*  CALCIUM  8.5* 8.7*  --  8.9 8.9 9.1  PROT 6.3* 6.9  --   --  6.8  --  BILITOT 0.7 0.5  --   --  0.6  --   ALKPHOS 56 60  --   --  57  --   ALT 29 28  --   --  26  --   AST 21 21  --   --  22  --   GLUCOSE 391* 509*  --  208* 197* 137*   < > = values in this interval not displayed.    Imaging/Diagnostic Tests: ECHOCARDIOGRAM COMPLETE  Result Date: 05/20/2021    ECHOCARDIOGRAM REPORT   Patient Name:   [name] Date of Exam: 05/20/2021 Medical Rec #:   [id number]       Height:       71.0 in Accession #:    [id number]      Weight:       387.0 lb Date of Birth:  1970/03/14        BSA:          2.791 m Patient Age:    51 years        BP:           131/89 mmHg Patient Gender: F               HR:           90 bpm. Exam Location:  Inpatient Procedure: 2D Echo, Color Doppler, Cardiac Doppler and Intracardiac            Opacification Agent Indications:    Congestive Heart Failure I50.9  History:        Patient has prior history of Echocardiogram examinations, most                 recent 03/16/2017. Risk Factors:Hypertension and Diabetes.  Sonographer:    Lauraine Pilot RDCS Referring Phys: [id number] KRISTIE M HORTON IMPRESSIONS  1. Left ventricular ejection fraction, by estimation, is 35 to 40%. The left ventricle has moderately decreased function. The left ventricle demonstrates global hypokinesis. The left ventricular internal cavity size was mildly dilated. There is mild concentric left ventricular hypertrophy. Left ventricular diastolic parameters are indeterminate. Elevated left ventricular end-diastolic pressure.  2. Right ventricular systolic function is normal. The right ventricular size is normal. There is normal pulmonary artery systolic pressure.  3. The mitral valve is normal in structure. Mild to moderate mitral valve regurgitation. No evidence of mitral stenosis.  4. The aortic valve was not well visualized. Aortic valve regurgitation is not visualized. Aortic valve sclerosis/calcification is present, without any evidence of aortic stenosis.  5. The inferior vena cava is normal in size with greater than 50% respiratory variability, suggesting right atrial pressure of 3 mmHg. FINDINGS  Left Ventricle: Left ventricular ejection fraction, by estimation, is 35 to 40%. The left ventricle has moderately decreased function. The left ventricle demonstrates global hypokinesis. Definity  contrast agent was given IV to delineate the left ventricular endocardial borders. The  left ventricular internal cavity size was mildly dilated. There is mild concentric left ventricular hypertrophy. Left ventricular diastolic parameters are indeterminate. Elevated left ventricular end-diastolic pressure. Right Ventricle: The right ventricular size is normal. No increase in right ventricular wall thickness. Right ventricular systolic function is normal. There is normal pulmonary artery systolic pressure. The tricuspid regurgitant velocity is 2.34 m/s, and  with an assumed right atrial pressure of 3 mmHg, the estimated right ventricular systolic pressure is 24.9 mmHg. Left Atrium: Left atrial size was normal in size. Right Atrium: Right atrial size was normal in size. Pericardium: There  is no evidence of pericardial effusion. Mitral Valve: The mitral valve is normal in structure. Mild to moderate mitral valve regurgitation. No evidence of mitral valve stenosis. Tricuspid Valve: The tricuspid valve is normal in structure. Tricuspid valve regurgitation is trivial. No evidence of tricuspid stenosis. Aortic Valve: The aortic valve was not well visualized. Aortic valve regurgitation is not visualized. Aortic valve sclerosis/calcification is present, without any evidence of aortic stenosis. Pulmonic Valve: The pulmonic valve was normal in structure. Pulmonic valve regurgitation is not visualized. No evidence of pulmonic stenosis. Aorta: The aortic root is normal in size and structure. Venous: The inferior vena cava is normal in size with greater than 50% respiratory variability, suggesting right atrial pressure of 3 mmHg. IAS/Shunts: No atrial level shunt detected by color flow Doppler.  LEFT VENTRICLE PLAX 2D LVIDd:         5.40 cm      Diastology LVIDs:         3.70 cm      LV e' medial:    8.43 cm/s LV PW:         1.10 cm      LV E/e' medial:  16.8 LV IVS:        1.10 cm      LV e' lateral:   8.11 cm/s LVOT diam:     2.20 cm      LV E/e' lateral: 17.5 LV SV:         59 LV SV Index:   21 LVOT Area:      3.80 cm  LV Volumes (MOD) LV vol d, MOD A2C: 92.7 ml LV vol d, MOD A4C: 142.0 ml LV vol s, MOD A2C: 52.8 ml LV vol s, MOD A4C: 84.5 ml LV SV MOD A2C:     39.9 ml LV SV MOD A4C:     142.0 ml LV SV MOD BP:      52.6 ml RIGHT VENTRICLE RV S prime:     10.70 cm/s TAPSE (M-mode): 2.0 cm LEFT ATRIUM             Index        RIGHT ATRIUM           Index LA diam:        4.60 cm 1.65 cm/m   RA Area:     19.50 cm LA Vol (A2C):   84.2 ml 30.16 ml/m  RA Volume:   49.10 ml  17.59 ml/m LA Vol (A4C):   88.1 ml 31.56 ml/m LA Biplane Vol: 86.5 ml 30.99 ml/m  AORTIC VALVE LVOT Vmax:   87.40 cm/s LVOT Vmean:  58.300 cm/s LVOT VTI:    0.155 m  AORTA Ao Root diam: 3.20 cm Ao Asc diam:  3.50 cm MITRAL VALVE                  TRICUSPID VALVE MV Area (PHT): 4.80 cm       TR Peak grad:   21.9 mmHg MV Decel Time: 158 msec       TR Vmax:        234.00 cm/s MR Peak grad:    83.9 mmHg MR Mean grad:    59.0 mmHg    SHUNTS MR Vmax:         458.00 cm/s  Systemic VTI:  0.16 m MR Vmean:        368.0 cm/s   Systemic Diam: 2.20 cm MR PISA:         1.57  cm MR PISA Eff ROA: 13 mm MR PISA Radius:  0.50 cm MV E velocity: 142.00 cm/s MV A velocity: 82.90 cm/s MV E/A ratio:  1.71 Wilbert Bihari MD Electronically signed by Wilbert Bihari MD Signature Date/Time: 05/20/2021/1:05:56 PM    Final    VAS US  LOWER EXTREMITY VENOUS (DVT)  Result Date: 05/20/2021  Lower Venous DVT Study Patient Name:  [name]  Date of Exam:   05/20/2021 Medical Rec #: [id number]        Accession #:    [id number] Date of Birth: [birth year]         Patient Gender: F Patient Age:   34 years Exam Location:  [facility] Procedure:      VAS US  LOWER EXTREMITY VENOUS (DVT) Referring Phys: KRISTIE HORTON --------------------------------------------------------------------------------  Indications: Pain.  Limitations: Body habitus. Comparison Study: No prior study Performing Technologist: Alberta Lis RVS  Examination Guidelines: A complete evaluation includes B-mode  imaging, spectral Doppler, color Doppler, and power Doppler as needed of all accessible portions of each vessel. Bilateral testing is considered an integral part of a complete examination. Limited examinations for reoccurring indications may be performed as noted. The reflux portion of the exam is performed with the patient in reverse Trendelenburg.  +-----+---------------+---------+-----------+----------+--------------+ RIGHTCompressibilityPhasicitySpontaneityPropertiesThrombus Aging +-----+---------------+---------+-----------+----------+--------------+ CFV  Full           Yes      Yes                                 +-----+---------------+---------+-----------+----------+--------------+   +---------+---------------+---------+-----------+----------+-------------------+ LEFT     CompressibilityPhasicitySpontaneityPropertiesThrombus Aging      +---------+---------------+---------+-----------+----------+-------------------+ CFV      Full           Yes      Yes                                      +---------+---------------+---------+-----------+----------+-------------------+ SFJ      Full                                                             +---------+---------------+---------+-----------+----------+-------------------+ FV Prox  Full                                                             +---------+---------------+---------+-----------+----------+-------------------+ FV Mid                  Yes      Yes                  patent by color and                                                       Doppler             +---------+---------------+---------+-----------+----------+-------------------+ FV  Distal               Yes      Yes                  patent by color and                                                       Doppler             +---------+---------------+---------+-----------+----------+-------------------+ PFV      Full                                                              +---------+---------------+---------+-----------+----------+-------------------+ POP      Full           Yes      Yes                                      +---------+---------------+---------+-----------+----------+-------------------+ PTV      Full                                                             +---------+---------------+---------+-----------+----------+-------------------+ PERO     Full                                                             +---------+---------------+---------+-----------+----------+-------------------+    Summary: RIGHT: - No evidence of common femoral vein obstruction.  LEFT: - There is no evidence of deep vein thrombosis in the lower extremity.  *See table(s) above for measurements and observations. Electronically signed by Fonda Rim on 05/20/2021 at 7:25:53 PM.    Final      Dartha Geralds, DO 05/21/2021, 9:11 AM PGY-1, [facility] Family Medicine FPTS Intern pager: [phone], text pages welcome

## 2021-05-21 NOTE — Discharge Summary (Signed)
 Family Medicine Teaching [facility] Discharge Summary  Patient name: [name] Medical record number: [id number] Date of birth: 1971/01/02 Age: 51 y.o. Gender: female Date of Admission: 05/19/2021  Date of Discharge: 05/21/21 Admitting Physician: Barabara Dama, DO  Primary Care Provider: Espinoza, Alejandra, DO Consultants: Cardiology, Nephrology  Indication for Hospitalization: Shortness of breath  Discharge Diagnoses/Problem List:  Principal Problem:   Dyspnea Active Problems:   Congestive heart failure [facility])    Disposition: Home  Discharge Condition: Stable  Discharge Exam:  General: Laying in bed, comfortable, NAD Cardiovascular: RRR, HR in 90s Respiratory: Clear in all fields, although limited exam 2/2 body habitus. No wheezing or crackles. ORA Abdomen: Obese, soft, normal bowel sounds Extremities: Warm, dry, 1+ pitting edema bilaterally  Brief Hospital Course:  ADELAINE ROPPOLO is a 51 y.o. female presenting with shortness of breath and reported episode of SVT in setting of recent admission for multifocal pneumonia and asthma exacerbation. PMH is significant for HTN, HLD, T2DM, CKD, hypothyroidism, IDA, asthma.  Acute systolic CHF Presented with shortness of breath and bilateral lower extremity edema. Initially tachycardia to low 100s and slightly tachypneic on room air but appeared comfortable s/p IV solumedrol x1 and NS bolus x1, with SpO2 >92%. CXR with pulmonary vascular congestion and elevated BNP 720 consistent with CHF exacerbation. Echo on 05/20/21 showed LVEF 34-40% with moderately decreased function and global left ventricle hypokinesis. She was further diuresed with IV lasix , then transitioned to oral furosemide  prior to discharge with improvement of symptoms. She was stable on room air.  New onset supraventricular tachycardia Pt with SVT with HR in 190's at urgent care before coming to ED and treated with IV adenosine .  In ED, pt went into SVT again  which resolved with carotid massage and vagal maneuver. Cardiology was consulted and switched Toprol  XL to metoprolol  succinate BID. Likely etiology of SVT included electrolyte abnormalities, CHF exacerbation. She did not have any further episodes of SVT after admission to the floor and remained in NSR.  Hyperkalemia K+ elevated to 6.2 on day 2 of admission, treated with Lokelma  x2 and appropriately fell to normal range. EKG without peaked T waves or ST elevation. No cardiac symptoms.  AKI on CKD stage 3a Cr elevated to 2.56 on admission, BUN 38, GFR 22.  Baseline Cr appears 1.3-1.6. Cr elevated to 2.31 at time of discharge.  Anemia, hx IDA requiring blood transfusions and IV iron transfusions Hgb of 8.6 on admission, baseline of 9-10. Iron studies showed low iron 14, ferritin 10, TIBC 410. She was treated with 2 doses of IV ferrelcet. Hgb 9.5 at discharge. She was discharged with instructions to take PO iron.  T2DM Pt was treated with SSI, semglee  65u twice daily and mealtime coverage 8u novolog .She was initially hyperglycemic to 400-500s, but decreased to 100-200s at time of discharge. She was discharged with home regimen of 65u lantus  BID, 15u novolog  BID with meals.  Other chronic conditions stable and treated with home medications  PCP follow-up items: Ensure outpatient cardiology follow-up. Toprol  XL was discontinued and she was started on Metoprolol  succinate BID and Bidil , per cardiology recommendations. Discuss discontinuing Remeron . Investigate financial barrier with Trulicity - patient reports not taking medication due to cost. Consider pharmacy appointment with Dr. Koval for further management of T2DM complexity Metformin  was held in light of AKI, resume as appropriate. Repeat BMP to ensure resolution of AKI vs. worsening CKD Recommend outpatient PFT for likely moderate to severe persistent asthma Recommend outpatient sleep study  Significant Procedures: none  Significant  Labs and Imaging:  Recent Labs  Lab 05/19/21 1243 05/20/21 0326 05/21/21 0544  WBC 7.6 7.9 12.3*  HGB 8.6* 8.6* 9.5*  HCT 29.2* 27.8* 31.5*  PLT 392 397 515*   Recent Labs  Lab 05/19/21 1243 05/20/21 0326 05/20/21 0633 05/20/21 1123 05/20/21 1806 05/21/21 0215  NA 135 132*  --  136 137 139  K 4.6 6.2*   < > 6.0* 5.0 4.5  CL 104 104  --  106 106 108  CO2 16* 17*  --  18* 18* 19*  GLUCOSE 391* 509*  --  208* 197* 137*  BUN 38* 43*  --  44* 46* 45*  CREATININE 2.56* 2.41*  --  2.41* 2.30* 2.31*  CALCIUM  8.5* 8.7*  --  8.9 8.9 9.1  MG 1.3* 1.9  --   --   --  2.0  PHOS  --   --   --   --   --  3.6  ALKPHOS 56 60  --   --  57  --   AST 21 21  --   --  22  --   ALT 29 28  --   --  26  --   ALBUMIN  3.1* 3.2*  --   --  3.1*  --    < > = values in this interval not displayed.    Results/Tests Pending at Time of Discharge: None  Discharge Medications:  Allergies as of 05/21/2021       Reactions   Ace Inhibitors Other (See Comments)   Patient had acute renal failure requiring hemodialysis after a suicide attempt.  Nephro recommended avoiding use.   Haldol  [haloperidol  Lactate] Other (See Comments)   Tardive dyskinesia   Nsaids Other (See Comments)   Nephro recommended avoiding after acute renal failure requiring hemodialysis associated with a suicide attempt.   Latex Other (See Comments)   Rash around IV site        Medication List     STOP taking these medications    guaiFENesin  600 MG 12 hr tablet Commonly known as: Mucinex    ipratropium-albuterol  0.5-2.5 (3) MG/3ML Soln Commonly known as: DUONEB Replaced by: albuterol  (2.5 MG/3ML) 0.083% nebulizer solution   lisinopril  5 MG tablet Commonly known as: ZESTRIL    metFORMIN  500 MG 24 hr tablet Commonly known as: GLUCOPHAGE -XR   metoprolol  succinate 25 MG 24 hr tablet Commonly known as: TOPROL -XL   Trulicity  0.75 MG/0.5ML Sopn Generic drug: Dulaglutide        TAKE these medications    albuterol  108  (90 Base) MCG/ACT inhaler Commonly known as: VENTOLIN  HFA Inhale 2 puffs into the lungs every 4 (four) hours as needed for wheezing or shortness of breath. What changed: Another medication with the same name was added. Make sure you understand how and when to take each.   albuterol  (2.5 MG/3ML) 0.083% nebulizer solution Commonly known as: PROVENTIL  Take 3 mLs by nebulization 3 (three) times daily as needed. What changed: You were already taking a medication with the same name, and this prescription was added. Make sure you understand how and when to take each. Replaces: ipratropium-albuterol  0.5-2.5 (3) MG/3ML Soln   Basaglar  KwikPen 100 UNIT/ML Inject 65 Units into the skin 2 (two) times daily. Take 65U twice a day   famotidine  20 MG tablet Commonly known as: PEPCID  Take 20 mg by mouth 2 (two) times daily.   FLUoxetine  40 MG capsule Commonly known as: PROZAC  Take 2 capsules (80 mg  total) by mouth daily.   fluticasone  50 MCG/ACT nasal spray Commonly known as: FLONASE  Place 1 spray into both nostrils daily. 1 spray in each nostril every day What changed:  when to take this reasons to take this additional instructions   fluticasone  furoate-vilanterol 200-25 MCG/ACT Aepb Commonly known as: Breo Ellipta  Inhale 1 puff into the lungs daily.   furosemide  40 MG tablet Commonly known as: LASIX  Take 1 tablet (40 mg total) by mouth 2 (two) times daily.   gabapentin  100 MG capsule Commonly known as: NEURONTIN  Take 3 capsules (300 mg total) by mouth 3 (three) times daily.   hydrOXYzine  50 MG capsule Commonly known as: VISTARIL  Take 1 capsule (50 mg total) by mouth 3 (three) times daily as needed for itching.   insulin  aspart 100 UNIT/ML FlexPen Commonly known as: NOVOLOG  Inject 10 units with Lunch and 15 units with Dinner What changed:  how much to take when to take this additional instructions   isosorbide -hydrALAZINE  20-37.5 MG tablet Commonly known as: BIDIL  Take 1  tablet by mouth 3 (three) times daily.   levothyroxine  300 MCG tablet Commonly known as: SYNTHROID  Take 1 tablet by mouth once daily What changed: when to take this   LORazepam  1 MG tablet Commonly known as: ATIVAN  TAKE 1 TABLET BY MOUTH EVERY 8 HOURS AS NEEDED FOR ANXIETY What changed:  reasons to take this additional instructions   metoprolol  tartrate 25 MG tablet Commonly known as: LOPRESSOR  Take 1 tablet (25 mg total) by mouth 2 (two) times daily.   mirtazapine  30 MG tablet Commonly known as: REMERON  Take 1 tablet (30 mg total) by mouth daily.   rosuvastatin  10 MG tablet Commonly known as: Crestor  Take 1 tablet (10 mg total) by mouth daily.   Symbicort  80-4.5 MCG/ACT inhaler Generic drug: budesonide -formoterol  Inhale 2 puffs into the lungs in the morning and at bedtime.   Vitamin D  (Ergocalciferol ) 1.25 MG (50000 UNIT) Caps capsule Commonly known as: DRISDOL  Take 1 capsule (50,000 Units total) by mouth every 7 (seven) days. Sunday        Discharge Instructions: Please refer to Patient Instructions section of EMR for full details.  Patient was counseled important signs and symptoms that should prompt return to medical care, changes in medications, dietary instructions, activity restrictions, and follow up appointments.   Follow-Up Appointments:  Follow-up Information     [facility]. Go on 05/24/2021.   Specialty: Family Medicine Why: Appointment at 2:30pm, please arrive at 2:15pm. Contact information: [address] [phone] [place]  [phone]                Dartha Geralds, DO 05/21/2021, 3:21 PM PGY-1, [facility] Family Medicine

## 2021-05-21 NOTE — Discharge Instructions (Signed)
Dear [name],  ? ?Thank you for letting us participate in your care! In this section, you will find a brief hospital admission summary of why you were admitted to the hospital, what happened during your admission, your diagnosis/diagnoses, and recommended follow up.  ?You were admitted because you were experiencing shortness of breath.  ?Your testing revealed you had extra fluid in your legs, and .  ?You were diagnosed with SVT (abnormally fast heart rhythm) and heart failure causing fluid build up in your legs. ?You were treated with IV lasix for your heart failure and adenosine for SVT.  ?You were also seen by the cardiologists.  ?Your condition improved and you were discharged from the hospital for meeting this goal.  ? ? ?POST-HOSPITAL & CARE INSTRUCTIONS ?Please go to your appointments seen below. ?Please let PCP/Specialists know of any changes in medications that were made.  ?Please see medications section of this packet for any medication changes.  ? ?DOCTOR'S APPOINTMENTS & FOLLOW UP ?No follow-ups on file.  ? ?Thank you for choosing [facility]! Take care and be well! ? ?Family Medicine Teaching Service Inpatient Team ?[place]  ?[facility]  ?[address] [place], [place] [address] ?[phone]  ?

## 2021-05-21 NOTE — Progress Notes (Signed)
? ? ?Subjective:  ?Mild cough no cardiac symptoms Has been OOB in room but not ambulating in halls  ? ?Objective:  ?Vitals:  ? 05/21/21 0259 05/21/21 0300 05/21/21 0745 05/21/21 0919  ?BP: (!) 128/101  (!) 144/68   ?Pulse: 86  84   ?Resp: 18 (!) 25 20   ?Temp: 98.9 ?F (37.2 ?C)  97.7 ?F (36.5 ?C)   ?TempSrc: Oral  Oral   ?SpO2: 95%  96% 95%  ?Weight:  (!) 183.8 kg    ?Height:      ? ? ?Intake/Output from previous day: ? ?Intake/Output Summary (Last 24 hours) at 05/21/2021 0931 ?Last data filed at 05/21/2021 0400 ?Gross per 24 hour  ?Intake 990 ml  ?Output 2700 ml  ?Net -1710 ml  ? ? ?Physical Exam: ?Morbidly obese female ?Lungs mild end exp wheezes  ?Distant heart sounds ?Abdomen benign ?Plus one LE edema  ? ?Lab Results: ?Basic Metabolic Panel: ?Recent Labs  ?  05/20/21 ?0326 05/20/21 ?0633 05/20/21 ?1806 05/21/21 ?0215  ?NA 132*   < > 137 139  ?K 6.2*   < > 5.0 4.5  ?CL 104   < > 106 108  ?CO2 17*   < > 18* 19*  ?GLUCOSE 509*   < > 197* 137*  ?BUN 43*   < > 46* 45*  ?CREATININE 2.41*   < > 2.30* 2.31*  ?CALCIUM 8.7*   < > 8.9 9.1  ?MG 1.9  --   --  2.0  ?PHOS  --   --   --  3.6  ? < > = values in this interval not displayed.  ? ?Liver Function Tests: ?Recent Labs  ?  05/20/21 ?0326 05/20/21 ?1806  ?AST 21 22  ?ALT 28 26  ?ALKPHOS 60 57  ?BILITOT 0.5 0.6  ?PROT 6.9 6.8  ?ALBUMIN 3.2* 3.1*  ? ?No results for input(s): LIPASE, AMYLASE in the last 72 hours. ?CBC: ?Recent Labs  ?  05/19/21 ?1243 05/20/21 ?0326 05/21/21 ?3875  ?WBC 7.6 7.9 12.3*  ?NEUTROABS 5.1 7.0  --   ?HGB 8.6* 8.6* 9.5*  ?HCT 29.2* 27.8* 31.5*  ?MCV 83.2 81.3 82.0  ?PLT 392 397 515*  ? ? ?Thyroid Function Tests: ?Recent Labs  ?  05/19/21 ?1243  ?TSH 2.689  ? ?Anemia Panel: ?Recent Labs  ?  05/20/21 ?0633  ?FERRITIN 10*  ?TIBC 410  ?IRON 14*  ? ? ?Imaging: ?DG Chest Port 1 View ? ?Result Date: 05/19/2021 ?CLINICAL DATA:  Cough and shortness of breath. EXAM: PORTABLE CHEST 1 VIEW COMPARISON:  04/25/2021 radiographs and prior studies FINDINGS:  Cardiomegaly and mild pulmonary vascular congestion noted. Defibrillator/pacing pads overlying the LEFT chest noted. There is no evidence of focal airspace disease, pulmonary edema, suspicious pulmonary nodule/mass, pleural effusion, or pneumothorax. No acute bony abnormalities are identified. IMPRESSION: Cardiomegaly with mild pulmonary vascular congestion. Electronically Signed   By: Margarette Canada M.D.   On: 05/19/2021 14:04  ? ?ECHOCARDIOGRAM COMPLETE ? ?Result Date: 05/20/2021 ?   ECHOCARDIOGRAM REPORT   Patient Name:   [name] Date of Exam: 05/20/2021 Medical Rec #:  [id number]       Height:       71.0 in Accession #:    [id number]      Weight:       387.0 lb Date of Birth:  February 16, 1971        BSA:          2.791 m? Patient Age:    51 years  BP:           131/89 mmHg Patient Gender: F               HR:           90 bpm. Exam Location:  Inpatient Procedure: 2D Echo, Color Doppler, Cardiac Doppler and Intracardiac            Opacification Agent Indications:    Congestive Heart Failure I50.9  History:        Patient has prior history of Echocardiogram examinations, most                 recent 03/16/2017. Risk Factors:Hypertension and Diabetes.  Sonographer:    Bernadene Person RDCS Referring Phys: [id number] [place]  1. Left ventricular ejection fraction, by estimation, is 35 to 40%. The left ventricle has moderately decreased function. The left ventricle demonstrates global hypokinesis. The left ventricular internal cavity size was mildly dilated. There is mild concentric left ventricular hypertrophy. Left ventricular diastolic parameters are indeterminate. Elevated left ventricular end-diastolic pressure.  2. Right ventricular systolic function is normal. The right ventricular size is normal. There is normal pulmonary artery systolic pressure.  3. The mitral valve is normal in structure. Mild to moderate mitral valve regurgitation. No evidence of mitral stenosis.  4. The aortic valve was not  well visualized. Aortic valve regurgitation is not visualized. Aortic valve sclerosis/calcification is present, without any evidence of aortic stenosis.  5. The inferior vena cava is normal in size with greater than 50% respiratory variability, suggesting right atrial pressure of 3 mmHg. FINDINGS  Left Ventricle: Left ventricular ejection fraction, by estimation, is 35 to 40%. The left ventricle has moderately decreased function. The left ventricle demonstrates global hypokinesis. Definity contrast agent was given IV to delineate the left ventricular endocardial borders. The left ventricular internal cavity size was mildly dilated. There is mild concentric left ventricular hypertrophy. Left ventricular diastolic parameters are indeterminate. Elevated left ventricular end-diastolic pressure. Right Ventricle: The right ventricular size is normal. No increase in right ventricular wall thickness. Right ventricular systolic function is normal. There is normal pulmonary artery systolic pressure. The tricuspid regurgitant velocity is 2.34 m/s, and  with an assumed right atrial pressure of 3 mmHg, the estimated right ventricular systolic pressure is 22.4 mmHg. Left Atrium: Left atrial size was normal in size. Right Atrium: Right atrial size was normal in size. Pericardium: There is no evidence of pericardial effusion. Mitral Valve: The mitral valve is normal in structure. Mild to moderate mitral valve regurgitation. No evidence of mitral valve stenosis. Tricuspid Valve: The tricuspid valve is normal in structure. Tricuspid valve regurgitation is trivial. No evidence of tricuspid stenosis. Aortic Valve: The aortic valve was not well visualized. Aortic valve regurgitation is not visualized. Aortic valve sclerosis/calcification is present, without any evidence of aortic stenosis. Pulmonic Valve: The pulmonic valve was normal in structure. Pulmonic valve regurgitation is not visualized. No evidence of pulmonic stenosis. Aorta:  The aortic root is normal in size and structure. Venous: The inferior vena cava is normal in size with greater than 50% respiratory variability, suggesting right atrial pressure of 3 mmHg. IAS/Shunts: No atrial level shunt detected by color flow Doppler.  LEFT VENTRICLE PLAX 2D LVIDd:         5.40 cm      Diastology LVIDs:         3.70 cm      LV e' medial:    8.43 cm/s LV PW:  1.10 cm      LV E/e' medial:  16.8 LV IVS:        1.10 cm      LV e' lateral:   8.11 cm/s LVOT diam:     2.20 cm      LV E/e' lateral: 17.5 LV SV:         59 LV SV Index:   21 LVOT Area:     3.80 cm?  LV Volumes (MOD) LV vol d, MOD A2C: 92.7 ml LV vol d, MOD A4C: 142.0 ml LV vol s, MOD A2C: 52.8 ml LV vol s, MOD A4C: 84.5 ml LV SV MOD A2C:     39.9 ml LV SV MOD A4C:     142.0 ml LV SV MOD BP:      52.6 ml RIGHT VENTRICLE RV S prime:     10.70 cm/s TAPSE (M-mode): 2.0 cm LEFT ATRIUM             Index        RIGHT ATRIUM           Index LA diam:        4.60 cm 1.65 cm/m?   RA Area:     19.50 cm? LA Vol (A2C):   84.2 ml 30.16 ml/m?  RA Volume:   49.10 ml  17.59 ml/m? LA Vol (A4C):   88.1 ml 31.56 ml/m? LA Biplane Vol: 86.5 ml 30.99 ml/m?  AORTIC VALVE LVOT Vmax:   87.40 cm/s LVOT Vmean:  58.300 cm/s LVOT VTI:    0.155 m  AORTA Ao Root diam: 3.20 cm Ao Asc diam:  3.50 cm MITRAL VALVE                  TRICUSPID VALVE MV Area (PHT): 4.80 cm?       TR Peak grad:   21.9 mmHg MV Decel Time: 158 msec       TR Vmax:        234.00 cm/s MR Peak grad:    83.9 mmHg MR Mean grad:    59.0 mmHg    SHUNTS MR Vmax:         458.00 cm/s  Systemic VTI:  0.16 m MR Vmean:        368.0 cm/s   Systemic Diam: 2.20 cm MR PISA:         1.57 cm? MR PISA Eff ROA: 13 mm? MR PISA Radius:  0.50 cm MV E velocity: 142.00 cm/s MV A velocity: 82.90 cm/s MV E/A ratio:  1.71 Fransico Him MD Electronically signed by Fransico Him MD Signature Date/Time: 05/20/2021/1:05:56 PM    Final   ? ?VAS [name] LOWER EXTREMITY VENOUS (DVT) ? ?Result Date: 05/20/2021 ? Lower Venous DVT Study  Patient Name:  [name]  Date of Exam:   05/20/2021 Medical Rec #: [id number]        Accession #:    [id number] Date of Birth: 02-20-1971         Patient Gender: F Patient Age:   32 years Exam Location:  [facility]

## 2021-05-24 ENCOUNTER — Ambulatory Visit (INDEPENDENT_AMBULATORY_CARE_PROVIDER_SITE_OTHER): Payer: BC Managed Care – PPO | Admitting: Family Medicine

## 2021-05-24 ENCOUNTER — Other Ambulatory Visit: Payer: Self-pay

## 2021-05-24 VITALS — BP 155/73 | HR 94 | Ht 71.0 in | Wt >= 6400 oz

## 2021-05-24 DIAGNOSIS — N1832 Chronic kidney disease, stage 3b: Secondary | ICD-10-CM

## 2021-05-24 DIAGNOSIS — E662 Morbid (severe) obesity with alveolar hypoventilation: Secondary | ICD-10-CM

## 2021-05-24 DIAGNOSIS — I5020 Unspecified systolic (congestive) heart failure: Secondary | ICD-10-CM | POA: Diagnosis not present

## 2021-05-24 DIAGNOSIS — J4541 Moderate persistent asthma with (acute) exacerbation: Secondary | ICD-10-CM

## 2021-05-24 DIAGNOSIS — I5041 Acute combined systolic (congestive) and diastolic (congestive) heart failure: Secondary | ICD-10-CM | POA: Diagnosis not present

## 2021-05-24 DIAGNOSIS — E118 Type 2 diabetes mellitus with unspecified complications: Secondary | ICD-10-CM | POA: Diagnosis not present

## 2021-05-24 DIAGNOSIS — I471 Supraventricular tachycardia: Secondary | ICD-10-CM

## 2021-05-24 MED ORDER — OZEMPIC (0.25 OR 0.5 MG/DOSE) 2 MG/1.5ML ~~LOC~~ SOPN: 0.5000 mg | PEN_INJECTOR | SUBCUTANEOUS | 3 refills | Status: DC

## 2021-05-24 NOTE — Patient Instructions (Addendum)
It was a pleasure to see you today! ? ?We will get some labs today.  If they are abnormal or we need to do something about them, I will call you.  If they are normal, I will send you a message on MyChart (if it is active) or a letter in the mail.  If you don't hear from [name] in 2 weeks, please call the office  [phone]. ?If your kidneys are better you can go back on the metformin ?Start ozempic this weekend ?On the day you start ozempic, decrease your basaglar by 5 units each time you take it: meaning take 50 units of basaglar twice a day starting Sunday ?If your fasting blood sugar is over 200, increase basaglar by 2U ?If your fastng blood sugar is less than 100, decrease basaglar by 5U ?Check your fasting blood sugar every day, keep a log ?I have placed a referral for cardiology and sleep medicine. You should receive a phone call in 1-2 weeks to schedule this appointment. If for any reason you do not receive a phone call or need more help scheduling this appointment, please call our office at [phone]. ?Follow up with Dr. Nita Sells in 2.5 weeks ? ? ? ?Be Well, ? ?Dr. Chauncey Reading ? ?

## 2021-05-24 NOTE — Progress Notes (Signed)
? ? ?SUBJECTIVE:  ? ?CHIEF COMPLAINT / HPI:  ? ?Hospital follow up: Patient was recently admitted to the hospital for asthma exacerbation with pneumonia which led to episode of SVT and newly diagnosed HFrEF. ? ?DM2: Patient reports that her current regimen is Basaglar 65 units twice daily.  Metformin was held due to AKI in the hospital, will get BMP today.  She does have a working glucometer and enough supplies at home.  She has not checked her fasting blood sugars since being out of the hospital.  Counseled on checking fasting blood sugars.  When asked about Trulicity, she does not specifically remember when she was using Trulicity.  She had previously been on Ozempic and stated that it was too expensive.  When questioned further about her Ozempic cost she reports that it is $25 a month with insurance however their finances are such that even a $25 a month expense with all of her other medications is a financial burden.  Patient has private insurance, therefore likely cannot get patient assistance. ? ?New HFrEF, SVT: Patient notices that she is out of breath, she has been taking the Lasix, metoprolol succinate twice daily, and BiDil as instructed. She was confused about how long to use the metoprolol, as she thought this is something that she could potentially stop.  Recommend that patient follow-up with cardiology given significant new diagnoses.  GDMT initiated in the hospital with beta-blocker, loop diuretic, bidil, will recommend SGLT2 inhibitor but when she has better control of her A1c, see diabetes problem.  Weight today is down 5 pounds from discharge. ? ?OHS/Asthma/PNA: Patient reports no further coughing problems, dyspnea improved since hospitalization.  We discussed at length the nature of OHS/OSA.  She would best be served by having pulmonary function tests to definitively diagnose asthma versus other obstructive cause such as COPD versus OHS.  Patient has appropriate inhalers and is using them.  Also  recommend a sleep study given her new cardiac comorbidities. ? ?PERTINENT  PMH / PSH: HFrEF, DM2, obesity class III, SVT, OHS ? ?OBJECTIVE:  ? ?BP (!) 155/73   Pulse 94   Ht 5\' 11"  (1.803 m)   Wt (!) 400 lb 4 oz (181.6 kg)   LMP 04/19/2021   SpO2 97%   BMI 55.82 kg/m?   ?Nursing note and vitals reviewed ?GEN: Age-appropriate, AA W, resting comfortably in chair, NAD, class III obesity ?Neck: Supple.  No JVD. ?Cardiac: Regular rate and rhythm. Normal S1/S2. No murmurs, rubs, or gallops appreciated. 2+ radial pulses. ?Lungs: Clear bilaterally to ascultation. No increased WOB, no accessory muscle usage. No w/r/r. ?Neuro: AOx3  ?Ext: no peripheral edema ?Psych: Pleasant and appropriate teach me ? ?ASSESSMENT/PLAN:  ? ?Type II diabetes mellitus with complication (HCC) ?Type 2 diabetes is chronic, uncontrolled.  Last A1c was 11% in February.  Current regimen includes Basaglar 65 units twice daily.  Counseled at length on nature of type 2 diabetes, importance of checking fasting blood glucose.  Will start Ozempic 0.25 mg q. Weekly and titrate as appropriate.  BMP shows appropriate renal function improved since hospital discharge, resolution of AKI.  Will recommend starting metformin again and titrating up.  Will start at 500 mg daily and increase to avoid diarrhea.  Follow-up scheduled with PCP in 10 days.  Once patient starts Ozempic if fasting blood glucose is less than 100, recommend decreasing long-acting insulin by 5 units every day that fasting blood glucose is less than 100.  Recommend she call us if her fasting  blood glucose is over 250-300 for sooner follow-up. ?- Continue basaglar 65U BID ?- Start ozempic 0.25 mg qw and titrate up ?- Start metformin 500 mg qAM and titrate up ?- If FBG <100, decrease long acting basaglar by 5 U every day this occurs ?- If FBG >250-300, call for closer follow up ?- Monitor A1c, start SGLT2i when A1C </= to 8-9, patient likely not able to tolerate it with A1c >11% ? ?Morbid  obesity ([place]) ?Starting GLP-1 for diabetes control as well as weight loss, discussed lifestyle changes extensively with patient ? ?Congestive heart failure ([place]) ?GDMT initiated for new diagnosis of HFrEF.  Referral to cardiology placed.  Discussed return precautions, lifestyle changes, expectations with diagnosis. ?-Continue metoprolol succinate twice daily ?-Continue BiDil ?-Continue Lasix ?-Patient will need ACE/ARB/ARNI, will allow PCP/cardiology to direct this therapy; appointment with cardiology scheduled for March 27, a follow-up appointment with PCP scheduled for April 4 ? ? ?CKD (chronic kidney disease) stage 3, GFR 30-59 ml/min (HCC) ?BMP shows that creatinine has significantly improved since hospitalization.  AKI resolved.  Recommend patient follow-up with nephrology at earliest convenient time ? ?Asthma exacerbation ?Patient has Symbicort which she is using twice daily.  Reports improvement since leaving the hospital as above.  Recommend patient follow-up with Dr. Valentina Lucks for formal PFTs, appointment scheduled. ? ?SVT (supraventricular tachycardia) ([place]) ?SVT occurred in setting of sepsis from asthma exacerbation due to multifocal pneumonia.  Hope this is a one-time issue for this patient.  Currently she is on metoprolol succinate twice daily.  Referred to cardiology for outpatient follow-up. ? ?Obesity hypoventilation syndrome ([place]) ?Referral to pulmonology/sleep medicine for sleep study. OHS vs OSA. Given patient's comorbidities, will likely greatly benefit from CPAP vs trelegy ?  ? ? ?Gladys Damme, MD ?[place]  ? ?

## 2021-05-25 LAB — BASIC METABOLIC PANEL
BUN/Creatinine Ratio: 15 (ref 9–23)
BUN: 23 mg/dL (ref 6–24)
CO2: 25 mmol/L (ref 20–29)
Calcium: 9.3 mg/dL (ref 8.7–10.2)
Chloride: 107 mmol/L — ABNORMAL HIGH (ref 96–106)
Creatinine, Ser: 1.51 mg/dL — ABNORMAL HIGH (ref 0.57–1.00)
Glucose: 181 mg/dL — ABNORMAL HIGH (ref 70–99)
Potassium: 5 mmol/L (ref 3.5–5.2)
Sodium: 144 mmol/L (ref 134–144)
eGFR: 41 mL/min/{1.73_m2} — ABNORMAL LOW (ref >59)

## 2021-05-25 LAB — MICROALBUMIN / CREATININE URINE RATIO
Creatinine, Urine: 98.8 mg/dL
Microalb/Creat Ratio: 10 mg/g creat (ref 0–29)
Microalbumin, Urine: 9.8 ug/mL

## 2021-05-28 ENCOUNTER — Telehealth: Payer: Self-pay | Admitting: Family Medicine

## 2021-05-28 ENCOUNTER — Encounter: Payer: Self-pay | Admitting: Family Medicine

## 2021-05-28 DIAGNOSIS — I471 Supraventricular tachycardia, unspecified: Secondary | ICD-10-CM

## 2021-05-28 DIAGNOSIS — E662 Morbid (severe) obesity with alveolar hypoventilation: Secondary | ICD-10-CM | POA: Insufficient documentation

## 2021-05-28 HISTORY — DX: Supraventricular tachycardia, unspecified: I47.10

## 2021-05-28 NOTE — Assessment & Plan Note (Signed)
Starting GLP-1 for diabetes control as well as weight loss, discussed lifestyle changes extensively with patient ?

## 2021-05-28 NOTE — Assessment & Plan Note (Signed)
Patient has Symbicort which she is using twice daily.  Reports improvement since leaving the hospital as above.  Recommend patient follow-up with Dr. Valentina Lucks for formal PFTs, appointment scheduled. ?

## 2021-05-28 NOTE — Assessment & Plan Note (Signed)
Referral to pulmonology/sleep medicine for sleep study. OHS vs OSA. Given patient's comorbidities, will likely greatly benefit from CPAP vs trelegy ?

## 2021-05-28 NOTE — Assessment & Plan Note (Signed)
>>  ASSESSMENT AND PLAN FOR ASTHMA EXACERBATION WRITTEN ON 05/28/2021  4:46 PM BY MAHONEY, CAITLIN, MD  Patient has Symbicort which she is using twice daily.  Reports improvement since leaving the hospital as above.  Recommend patient follow-up with Dr. Raymondo Band for formal PFTs, appointment scheduled.

## 2021-05-28 NOTE — Assessment & Plan Note (Signed)
SVT occurred in setting of sepsis from asthma exacerbation due to multifocal pneumonia.  Hope this is a one-time issue for this patient.  Currently she is on metoprolol succinate twice daily.  Referred to cardiology for outpatient follow-up. ?

## 2021-05-28 NOTE — Assessment & Plan Note (Signed)
BMP shows that creatinine has significantly improved since hospitalization.  AKI resolved.  Recommend patient follow-up with nephrology at earliest convenient time ?

## 2021-05-28 NOTE — Telephone Encounter (Signed)
Called patient to discuss lab results. No answer, left HIPAA compliant VM. Will send MyChart message. ? ?Patient's AKI has resolved. She may restart metformin. To avoid diarrhea, I would start metformin 500 mg once a day and she will receive further instructions at her follow up to increase from there. If she needs metformin, have her call or send my chart message and I can refill this for her. ? ?Gladys Damme, MD ?[facility], PGY-3 ? ?

## 2021-05-28 NOTE — Assessment & Plan Note (Signed)
GDMT initiated for new diagnosis of HFrEF.  Referral to cardiology placed.  Discussed return precautions, lifestyle changes, expectations with diagnosis. ?-Continue metoprolol succinate twice daily ?-Continue BiDil ?-Continue Lasix ?-Patient will need ACE/ARB/ARNI, will allow PCP/cardiology to direct this therapy; appointment with cardiology scheduled for March 27, a follow-up appointment with PCP scheduled for April 4 ? ?

## 2021-05-28 NOTE — Assessment & Plan Note (Signed)
Type 2 diabetes is chronic, uncontrolled.  Last A1c was 11% in February.  Current regimen includes Basaglar 65 units twice daily.  Counseled at length on nature of type 2 diabetes, importance of checking fasting blood glucose.  Will start Ozempic 0.25 mg q. Weekly and titrate as appropriate.  BMP shows appropriate renal function improved since hospital discharge, resolution of AKI.  Will recommend starting metformin again and titrating up.  Will start at 500 mg daily and increase to avoid diarrhea.  Follow-up scheduled with PCP in 10 days.  Once patient starts Ozempic if fasting blood glucose is less than 100, recommend decreasing long-acting insulin by 5 units every day that fasting blood glucose is less than 100.  Recommend she call us if her fasting blood glucose is over 250-300 for sooner follow-up. ?- Continue basaglar 65U BID ?- Start ozempic 0.25 mg qw and titrate up ?- Start metformin 500 mg qAM and titrate up ?- If FBG <100, decrease long acting basaglar by 5 U every day this occurs ?- If FBG >250-300, call for closer follow up ?- Monitor A1c, start SGLT2i when A1C </= to 8-9, patient likely not able to tolerate it with A1c >11% ?

## 2021-06-03 ENCOUNTER — Other Ambulatory Visit: Payer: Self-pay

## 2021-06-03 ENCOUNTER — Ambulatory Visit: Payer: BC Managed Care – PPO | Admitting: Internal Medicine

## 2021-06-03 VITALS — BP 136/84 | HR 91 | Ht 71.0 in | Wt 387.0 lb

## 2021-06-03 DIAGNOSIS — I429 Cardiomyopathy, unspecified: Secondary | ICD-10-CM

## 2021-06-03 MED ORDER — FUROSEMIDE 40 MG PO TABS: 40.0000 mg | ORAL_TABLET | Freq: Two times a day (BID) | ORAL | 3 refills | Status: DC

## 2021-06-03 MED ORDER — ISOSORB DINITRATE-HYDRALAZINE 20-37.5 MG PO TABS: 1.0000 | ORAL_TABLET | Freq: Three times a day (TID) | ORAL | 3 refills | Status: DC

## 2021-06-03 MED ORDER — ENTRESTO 24-26 MG PO TABS: 1.0000 | ORAL_TABLET | Freq: Two times a day (BID) | ORAL | 3 refills | Status: DC

## 2021-06-03 MED ORDER — METOPROLOL TARTRATE 25 MG PO TABS: 25.0000 mg | ORAL_TABLET | Freq: Two times a day (BID) | ORAL | 3 refills | Status: DC

## 2021-06-03 NOTE — Progress Notes (Signed)
?Cardiology Office Note:   ? ?Date:  06/03/2021  ? ?ID:  [name], DOB 12/20/1970, MRN [id number] ? ?PCP:  [name], DO ?  ?[facility] Providers ?Cardiologist:  Janina Mayo, MD    ? ?Referring MD: McDiarmid, Blane Ohara, MD  ? ?No chief complaint on file. ?Hospital FU ? ?History of Present Illness:   ? ?[name] is a 51 y.o. female with a hx of IHD for 6 weeks/rhabdo induced by drug overdose, referral for Hfref EF 35-40% global hypokinesis from 45-50 in 2019, DM2, HTN, CKD, referral for hospital FU ? ?She was admitted 05/19/2021-05/21/2021 for PNA and asthma exacerbation. Managed with solumdrol. She also p/w LE edema, pulmonary vascular congestion c/f CHF exacerbation. She was diuresed. She had an echo noting moderately reduced LV function. Crt 2.56-> 1.51. GFR 41. K 5.0. ? ?She was evaluated by cardiology for SVT noted at urgent care which resolved with vagal maneuver. Lopressor was recommended from succinate. K was 6.2. Her reduced LV function was thought be be non ischemic, possibly tachy-mediated. ? ?Since she left the hospital. She notes epigastric pain. She is having gastric reflux. Pepcid is not helping. She denies nausea or vomiting. She was on jardiance before and had to much urination. She weighs 387. She was 400 during the admission. Noted feet swelling better. She has pulse ox, no high heart rates. No PND or orthopnea. She does snore. Blood pressure well controlled today. ? ?Wt Readings from Last 3 Encounters:  ?06/03/21 (!) 387 lb (175.5 kg)  ?05/24/21 (!) 400 lb 4 oz (181.6 kg)  ?05/21/21 (!) 405 lb 4.8 oz (183.8 kg)  ? ? ? ?The 10-year ASCVD risk score (Arnett DK, et al., 2019) is: 13.3% ?  Values used to calculate the score: ?    Age: 33 years ?    Sex: Female ?    Is Non-Hispanic African American: Yes ?    Diabetic: Yes ?    Tobacco smoker: No ?    Systolic Blood Pressure: 768 mmHg ?    Is BP treated: Yes ?    HDL Cholesterol: 49 mg/dL ?    Total Cholesterol: 201  mg/dL ? ? ?Cardiology Studies ?EF closer to 40%, global hypokinesis, normal RV fxn. Mild-moderate MR. Normal RVSP ? ?Past Medical History:  ?Diagnosis Date  ? Acute renal failure ([place]) 05/27/10  ? hemodialysis for 6 weeks  ? Anxiety   ? Asthma   ? Depression   ? Diabetes mellitus   ? Hypertension   ? Rhabdomyolysis 06/06/10  ? after drug overdose  ? Suicide attempt by drug ingestion ([place]) 06/05/10  ? result rhabdomyolosis and ARF requrining dialysis   ? ? ?Past Surgical History:  ?Procedure Laterality Date  ? ANKLE ARTHROSCOPY Right 08/17/2015  ? Procedure: RIGHT ANKLE ARTHROSCOPY WITH SYNOVECTOMY AND LOOSE BODY EXCISION;  Surgeon: Melrose Nakayama, MD;  Location: [facility];  Service: Orthopedics;  Laterality: Right;  ? CHOLECYSTECTOMY    ? TUBAL LIGATION  1998  ? ? ?Current Medications: ?Current Meds  ?Medication Sig  ? albuterol (PROVENTIL) (2.5 MG/3ML) 0.083% nebulizer solution Take 3 mLs by nebulization 3 (three) times daily as needed.  ? albuterol (VENTOLIN HFA) 108 (90 Base) MCG/ACT inhaler Inhale 2 puffs into the lungs every 4 (four) hours as needed for wheezing or shortness of breath.  ? famotidine (PEPCID) 20 MG tablet Take 20 mg by mouth 2 (two) times daily.  ? FLUoxetine (PROZAC) 40 MG capsule Take 2 capsules (80 mg total)  by mouth daily.  ? fluticasone (FLONASE) 50 MCG/ACT nasal spray Place 1 spray into both nostrils daily. 1 spray in each nostril every day (Patient taking differently: Place 1 spray into both nostrils daily as needed for allergies.)  ? fluticasone furoate-vilanterol (BREO ELLIPTA) 200-25 MCG/ACT AEPB Inhale 1 puff into the lungs daily.  ? furosemide (LASIX) 40 MG tablet Take 1 tablet (40 mg total) by mouth 2 (two) times daily.  ? hydrOXYzine (VISTARIL) 50 MG capsule Take 1 capsule (50 mg total) by mouth 3 (three) times daily as needed for itching.  ? insulin aspart (NOVOLOG) 100 UNIT/ML FlexPen Inject 10 units with Lunch and 15 units with Dinner (Patient taking differently: 15-20 Units in the  morning and at bedtime. Inject 15 units with Lunch and 20 units with Dinner)  ? Insulin Glargine (BASAGLAR KWIKPEN) 100 UNIT/ML Inject 65 Units into the skin 2 (two) times daily. Take 65U twice a day  ? isosorbide-hydrALAZINE (BIDIL) 20-37.5 MG tablet Take 1 tablet by mouth 3 (three) times daily.  ? levothyroxine (SYNTHROID) 300 MCG tablet Take 1 tablet by mouth once daily (Patient taking differently: Take 300 mcg by mouth daily before breakfast.)  ? LORazepam (ATIVAN) 1 MG tablet TAKE 1 TABLET BY MOUTH EVERY 8 HOURS AS NEEDED FOR ANXIETY (Patient taking differently: Take 1 mg by mouth every 8 (eight) hours as needed for anxiety.)  ? metoprolol tartrate (LOPRESSOR) 25 MG tablet Take 1 tablet (25 mg total) by mouth 2 (two) times daily.  ? mirtazapine (REMERON) 30 MG tablet Take 1 tablet (30 mg total) by mouth daily.  ? rosuvastatin (CRESTOR) 10 MG tablet Take 1 tablet (10 mg total) by mouth daily.  ? sacubitril-valsartan (ENTRESTO) 24-26 MG Take 1 tablet by mouth 2 (two) times daily.  ? Semaglutide,0.25 or 0.5MG /DOS, (OZEMPIC, 0.25 OR 0.5 MG/DOSE,) 2 MG/1.5ML SOPN Inject 0.5 mg into the skin once a week.  ? Vitamin D, Ergocalciferol, (DRISDOL) 50000 units CAPS capsule Take 1 capsule (50,000 Units total) by mouth every 7 (seven) days. Sunday  ? [DISCONTINUED] budesonide-formoterol (SYMBICORT) 80-4.5 MCG/ACT inhaler Inhale 2 puffs into the lungs in the morning and at bedtime.  ? [DISCONTINUED] gabapentin (NEURONTIN) 100 MG capsule Take 3 capsules (300 mg total) by mouth 3 (three) times daily.  ?  ? ?Allergies:   Ace inhibitors, Haldol [haloperidol lactate], Nsaids, and Latex  ? ?Social History  ? ?Socioeconomic History  ? Marital status: Married  ?  Spouse name: Not on file  ? Number of children: 2  ? Years of education: Not on file  ? Highest education level: Not on file  ?Occupational History  ? Occupation: Corporate treasurer  ?Tobacco Use  ? Smoking status: Never  ? Smokeless tobacco: Never  ?Vaping Use  ? Vaping  Use: Never used  ?Substance and Sexual Activity  ? Alcohol use: No  ? Drug use: No  ? Sexual activity: Yes  ?  Comment: with husband   ?Other Topics Concern  ? Not on file  ?Social History Narrative  ? Married high school boyfriend at age 32, two boys, still married but he has been living with other women for years.  ? 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   ?   ? ?Social Determinants of Health  ? ?Financial Resource Strain: Not on file  ?Food Insecurity: Not on file  ?Transportation Needs: Not on file  ?Physical Activity: Not on file  ?Stress: Not on  file  ?Social Connections: Not on file  ?  ? ?Family History: ?The patient's family history includes Asthma in her father. ? ?ROS:   ?Please see the history of present illness.    ? All other systems reviewed and are negative. ? ?EKGs/Labs/Other Studies Reviewed:   ? ?The following studies were reviewed today: ? ? ?EKG:  EKG is  ordered today.  The ekg ordered today demonstrates  ? ?EKG 06/03/2021-NSR, Qtc 492 ? ?Recent Labs: ?05/19/2021: B Natriuretic Peptide 720.6; TSH 2.689 ?05/20/2021: ALT 26 ?05/21/2021: Hemoglobin 9.5; Magnesium 2.0; Platelets 515 ?05/24/2021: BUN 23; Creatinine, Ser 1.51; Potassium 5.0; Sodium 144  ?Recent Lipid Panel ?   ?Component Value Date/Time  ? CHOL 201 (H) 04/15/2021 1532  ? TRIG 231 (H) 04/15/2021 1532  ? HDL 49 04/15/2021 1532  ? CHOLHDL 4.1 04/15/2021 1532  ? CHOLHDL 3.1 04/10/2015 1656  ? VLDL 46 (H) 04/10/2015 1656  ? LDLCALC 112 (H) 04/15/2021 1532  ? LDLDIRECT 108 (H) 02/16/2013 1605  ? ? ? ?Risk Assessment/Calculations:   ?  ? ?    ? ?Physical Exam:   ? ?VS:   ? ?Vitals:  ? 06/03/21 1551  ?BP: 136/84  ?Pulse: 91  ?SpO2: 96%  ? ? ? ?Wt Readings from Last 3 Encounters:  ?06/03/21 (!) 387 lb (175.5 kg)  ?05/24/21 (!) 400 lb 4 oz (181.6 kg)  ?05/21/21 (!) 405 lb 4.8 oz (183.8 kg)  ?  ? ?GEN:  Well nourished, well developed in no acute distress. Obese BMI 53 ?HEENT: Normal ?NECK: No JVD;  No carotid bruits ?LYMPHATICS: No lymphadenopathy ?CARDIAC: RRR, no murmurs, rubs, gallops ?RESPIRATORY:  Clear to auscultation without rales, wheezing or rhonchi  ?ABDOMEN: Soft, non-tender, non-distended ?MUSCULOSKELETA

## 2021-06-03 NOTE — Patient Instructions (Addendum)
Medication Instructions:  ?START: ENTRESTO 24-26  ONE TABLET TWICE DAILY  ?*If you need a refill on your cardiac medications before your next appointment, please call your pharmacy* ? ?Lab Work: ? ?Please return for Blood Work in 2 [place] FAST. No appointment needed, lab here at the office is open Monday-Friday from 8AM to 4PM and closed daily for lunch from 12:45-1:45.  ? ?If you have labs (blood work) drawn today and your tests are completely normal, you will receive your results only by: ?MyChart Message (if you have MyChart) OR ?A paper copy in the mail ?If you have any lab test that is abnormal or we need to change your treatment, we will call you to review the results. ? ?Your physician has requested that you have an echocardiogram IN 3 MONTHS. Echocardiography is a painless test that uses sound waves to create images of your heart. It provides your doctor with information about the size and shape of your heart and how well your heart?s chambers and valves are working. You may receive an ultrasound enhancing agent through an IV if needed to better visualize your heart during the echo.This procedure takes approximately one hour. There are no restrictions for this procedure. This will take place at the [address], [address].   ? ?Follow-Up: ?At [facility], you and your health needs are our priority.  As part of our continuing mission to provide you with exceptional heart care, we have created designated Provider Care Teams.  These Care Teams include your primary Cardiologist (physician) and Advanced Practice Providers (APPs -  Physician Assistants and Nurse Practitioners) who all work together to provide you with the care you need, when you need it. ? ?Your next appointment:   ?3 month(s) RIGHT AFTER ECHO  ? ?The format for your next appointment:   ?In Person ? ?Provider:   ?Janina Mayo, MD   ? ?

## 2021-06-04 ENCOUNTER — Other Ambulatory Visit: Payer: Self-pay

## 2021-06-04 DIAGNOSIS — F411 Generalized anxiety disorder: Secondary | ICD-10-CM

## 2021-06-05 ENCOUNTER — Telehealth: Payer: Self-pay | Admitting: Licensed Clinical Social Worker

## 2021-06-05 MED ORDER — LORAZEPAM 1 MG PO TABS: 1.0000 mg | ORAL_TABLET | Freq: Three times a day (TID) | ORAL | 0 refills | Status: DC | PRN

## 2021-06-05 NOTE — Telephone Encounter (Signed)
LCSW attempted to reach pt, financial challenges shared during appt on 3/27 appt. No answer, message left requesting call back at [phone]. ? ?Westley Hummer, MSW, LCSW ?Clinical Social Worker II ?[place]  ?[phone]- work cell phone (preferred) ?[phone]- desk phone ? ?

## 2021-06-07 ENCOUNTER — Telehealth: Payer: Self-pay | Admitting: Licensed Clinical Social Worker

## 2021-06-07 ENCOUNTER — Telehealth: Payer: Self-pay | Admitting: Internal Medicine

## 2021-06-07 NOTE — Telephone Encounter (Signed)
LCSW attempted to reach pt for a second time regarding assistance program eligibility. No answer again at [phone], message left requesting call back. Will re-attempt as able.  ? ?Westley Hummer, MSW, LCSW ?Clinical Social Worker II ?[facility]  ?[phone]- work cell phone (preferred) ?[phone]- desk phone ? ?

## 2021-06-07 NOTE — Telephone Encounter (Signed)
Pt c/o medication issue: ? ?1. Name of Medication: sacubitril-valsartan (ENTRESTO) 24-26 MG ? ?2. How are you currently taking this medication (dosage and times per day)? Not currently taking ? ?3. Are you having a reaction (difficulty breathing--STAT)? no ? ?4. What is your medication issue? Pt medication requires a PA please advise  ? ?

## 2021-06-08 NOTE — Patient Instructions (Signed)
It was wonderful to see you today. ? ?Please bring ALL of your medications with you to every visit.  ? ?Today we talked about: ? ?-Schedule an appointment in June for diabetes check.  ?-I think you would benefit from a sleep study, let me know when you would like to proceed with this. This will help [name] to evaluate any other underlying breathing disorders that could be affecting your health.  ?--I recommend a colonoscopy to assess for colon cancer.  ?-Try taking a claritin (loratidine) every day to help with your allergies. I also refilled your flonase.  ?--You are overdue for a Pap smear. This is a screening test to check for signs of cervical cancer. Please schedule and appointment to return for this at your earliest convenience.   ?--I am sending a prescription for the shingles vaccine. Please get this at your earliest convenience. Most common side effects of the shingles vaccine include sore arm, headache, fever, and muscle aches following the vaccine so plan accordingly.   ? ? ?Thank you for choosing [facility].  ? ?Please call [phone] with any questions about today's appointment. ? ?Please be sure to schedule follow up at the front  desk before you leave today.  ? ?[name], DO ?PGY-2 Family Medicine   ?

## 2021-06-08 NOTE — Progress Notes (Signed)
? ? ?SUBJECTIVE:  ? ?CHIEF COMPLAINT / HPI:  ? ?Hospital F/U  ?LEANN MAYWEATHER is a 51 y.o. female who presents to the [facility] clinic today accompanied by her husband for f/u on her recent hospitalization. She was hospitalized from 3/12-3/14 for PNA and asthma exacerbation. During hospitalization was also found to have new onset HFrEF with BNP 720 and echo showing EF 35-40% (closer to 40-45% per outpt cardiologist) with global hypokinesis. Last follow up with Cardiology was on 3/27. Dry weight ~387 lbs. She was started on Entresto but has not yet started it given financial issues (appears Cardiology office may be working on [place]). She was advised to continue metoprolol tartrate, bidil, lasix. Recommended to avoid spironolactone given CKD/hx hyperkalemia in the hospital. Today she states that she feels better than she has in the last 6-7 weeks. She still has a "nasally talk".  ? ?Type 2 Diabetes Mellitus ?A1c in February was 11. Current regimen include Basaglar 65U BID. She was started on 0.25 mg of Ozempic weekly at last f/u on 3/17, is tolerating without adverse effects. Metformin was also restarted at that time. She is due for diabetes follow up in May but would prefer to wait until June when school lets out.  ? ?Hx of SVT ?Had an episode of SVT at an urgent care prior to hospitalization which resolved with adenosine. Had another episode in the ED that resolved with vagal maneuvers. Her Toprol XL was switched to metoprolol tartrate BID. Reports intermittent palpitations that last only few seconds, recalls about 3 total episodes since her hospitalization. ? ?HFrEF ?Per recent Cardiology visit, EF seems to be closer to 40-45%. Dry weight ~387 lbs, today she is 400 lbs. She denies lower extremity edema, shortness of breath, PND. Has not been able to get her Entresto yet.  ? ?Asthma ?Currently using Breo daily. Denies current SOB. ?Component of sleep apnea. She would like to hold off on sleep study as she feels that there  is too much going on right now and she cannot afford another test or appointment. ? ?Health Maintenance ?Amenable to receiving 2nd dose of shingles vaccination. She states she does not feel comfortable with a colonscopy and would not desire one. She also does not want to pursue cologuard testing.  ? ?PERTINENT  PMH / PSH: T2DM, anxiety, asthma, HTN, HLD, osteoarthritis of knee, hypothyroidism, bipolar disorder  ? ?OBJECTIVE:  ? ?BP (!) 141/83   Pulse 99   Wt (!) 396 lb (179.6 kg)   SpO2 99%   BMI 55.23 kg/m?   ? ?General: NAD, pleasant, able to participate in exam ?Cardiac: RRR, no murmurs. ?Respiratory: CTAB, normal effort, No wheezes, rales or rhonchi ?Abdomen: Obese ?Extremities: no edema or cyanosis. ?Psych: Normal affect and mood ? ?ASSESSMENT/PLAN:  ? ?Congestive heart failure ([place]) ?Followed by cardiology.  She reports daily compliance to all her medications that she has, however she still has not been able to obtain Entresto due to her financial barriers. Will route to Cardiologist, should we consider starting ARB alone given financial barriers with entresto? ?-Continue metoprolol tartrate, BiDil, Lasix ?-Avoiding spironolactone given her history of hyperkalemia and CKD ? ?Morbid obesity ([place]) ?There is a question whether she also had OHS or OSA.  At this time, patient declines sleep study. She understands that underlying sleep apnea could have negative health results.  We will continue to discuss in the future. ? ?Type II diabetes mellitus with complication ([place]) ?Due for follow-up diabetes examination in May, however patient elected  to schedule in June as she will have more free-time.  ?-Continue current regimen at this time ?-Patient to schedule appointment in June ? ?Asthma ?Appears much more controlled today with no wheezing and good air movement. ?-Continue medications as prescribed ? ?SVT (supraventricular tachycardia) ([place]) ?Question if patient is having PVC's given her brief palpitations lasting  only seconds. Could also consider other arrhythmias such as a. Fib, recurrent SVT, etc.  ?-Seek immediate care if more persistent or causing respiratory distress or chest pain ?-Continue beta blockers ? ?Healthcare maintenance ?-Mammogram previously ordered ?-Shingrix sent to pharmacy for 2nd dose ?-Due for Pap smear, patient to call and schedule ?-Declines colonoscopy and cologuard testing  ? ? ?[name], DO ?[place]  ? ?

## 2021-06-10 ENCOUNTER — Encounter: Payer: Self-pay | Admitting: Family Medicine

## 2021-06-10 ENCOUNTER — Ambulatory Visit (INDEPENDENT_AMBULATORY_CARE_PROVIDER_SITE_OTHER): Payer: BC Managed Care – PPO | Admitting: Family Medicine

## 2021-06-10 ENCOUNTER — Other Ambulatory Visit: Payer: Self-pay

## 2021-06-10 DIAGNOSIS — J45909 Unspecified asthma, uncomplicated: Secondary | ICD-10-CM

## 2021-06-10 DIAGNOSIS — J3489 Other specified disorders of nose and nasal sinuses: Secondary | ICD-10-CM

## 2021-06-10 DIAGNOSIS — I471 Supraventricular tachycardia, unspecified: Secondary | ICD-10-CM

## 2021-06-10 DIAGNOSIS — E118 Type 2 diabetes mellitus with unspecified complications: Secondary | ICD-10-CM

## 2021-06-10 DIAGNOSIS — I5041 Acute combined systolic (congestive) and diastolic (congestive) heart failure: Secondary | ICD-10-CM

## 2021-06-10 DIAGNOSIS — Z0000 Encounter for general adult medical examination without abnormal findings: Secondary | ICD-10-CM

## 2021-06-10 MED ORDER — ZOSTER VAC RECOMB ADJUVANTED 50 MCG/0.5ML IM SUSR: 0.5000 mL | Freq: Once | INTRAMUSCULAR | 0 refills | Status: AC

## 2021-06-10 MED ORDER — FLUTICASONE PROPIONATE 50 MCG/ACT NA SUSP: 1.0000 | Freq: Every day | NASAL | 1 refills | Status: AC

## 2021-06-10 NOTE — Assessment & Plan Note (Signed)
Question if patient is having PVC's given her brief palpitations lasting only seconds. Could also consider other arrhythmias such as a. Fib, recurrent SVT, etc.  ?-Seek immediate care if more persistent or causing respiratory distress or chest pain ?-Continue beta blockers ?

## 2021-06-10 NOTE — Telephone Encounter (Signed)
**Note De-Identified  Obfuscation** Entresto PA started through covermymeds. ?Key: [id number] ?

## 2021-06-10 NOTE — Assessment & Plan Note (Signed)
-  Mammogram previously ordered ?-Shingrix sent to pharmacy for 2nd dose ?-Due for Pap smear, patient to call and schedule ?-Declines colonoscopy and cologuard testing  ?

## 2021-06-10 NOTE — Assessment & Plan Note (Signed)
There is a question whether she also had OHS or OSA.  At this time, patient declines sleep study. She understands that underlying sleep apnea could have negative health results.  We will continue to discuss in the future. ?

## 2021-06-10 NOTE — Assessment & Plan Note (Signed)
Appears much more controlled today with no wheezing and good air movement. ?-Continue medications as prescribed ?

## 2021-06-10 NOTE — Assessment & Plan Note (Signed)
Due for follow-up diabetes examination in May, however patient elected to schedule in June as she will have more free-time.  ?-Continue current regimen at this time ?-Patient to schedule appointment in June ?

## 2021-06-10 NOTE — Assessment & Plan Note (Signed)
Followed by cardiology.  She reports daily compliance to all her medications that she has, however she still has not been able to obtain Entresto due to her financial barriers. Will route to Cardiologist, should we consider starting ARB alone given financial barriers with entresto? ?-Continue metoprolol tartrate, BiDil, Lasix ?-Avoiding spironolactone given her history of hyperkalemia and CKD ?

## 2021-06-11 ENCOUNTER — Telehealth: Payer: Self-pay

## 2021-06-11 ENCOUNTER — Telehealth: Payer: Self-pay | Admitting: Licensed Clinical Social Worker

## 2021-06-11 NOTE — Telephone Encounter (Signed)
Third attempt to reach pt I was able to reach pt today at [phone]. Introduced self, role, reason for call. Pt states that she is busy but will try and call me back this afternoon. If I do not get a return call then I will return call again tomorrow.  ? ?Westley Hummer, MSW, LCSW ?Clinical Social Worker II ?[place] [facility] Navigation  ?[phone]- work cell phone (preferred) ?[phone]- desk phone ? ?

## 2021-06-11 NOTE — Telephone Encounter (Signed)
Pt returned my call but did not leave voicemail- this writer was unable to answer at that time. I attempted pt 7 minutes later and her phone went directly to voicemail.  ? ?Westley Hummer, MSW, LCSW ?Clinical Social Worker II ?[place] [facility] Navigation  ?[phone]- work cell phone (preferred) ?[phone]- desk phone ? ?

## 2021-06-11 NOTE — Telephone Encounter (Signed)
**Note De-Identified  Obfuscation** Approval letter received from [employer] [employer]: ? ?Dear Zollie Beckers: ?[employer] ? received a request from your provider for coverage of Entresto ?(sacubitril-valsartan). ?As long as you remain covered by the [facility] and there are no changes to ?your plan benefits, this request is approved for the following time period: ?06/10/2021 - 06/11/2022 ? ?I have notified [place], [place] (Ph: [phone]) of this approval.  ? ?

## 2021-06-11 NOTE — Telephone Encounter (Signed)
-----   Message from Janina Mayo, MD sent at 06/11/2021 11:29 AM EDT ----- ?Regarding: ARB ?Yes that's fine.  ? ?Eliezer Lofts can you please stop her entresto and start losartan 25 mg and reach out to patient assistance for entresto. Thank you! ?----- Message ----- ?From: [name], DO ?Sent: 06/10/2021   5:20 PM EDT ?To: Janina Mayo, MD ? ?Hi! This is a mutual patient of ours that you recently saw. It seems that she is having difficulty affording/obtaining her Entresto. I was wondering if you think she would benefit from starting an ARB alone while the Delene Loll gets figured out. It would likely be more affordable. She has many financial barriers as she is the sole provider in her family.  ? ?Let me know your thoughts! ?Thanks! ?[name], PGY-2 ? ?

## 2021-06-11 NOTE — Telephone Encounter (Signed)
Called and spoke with [place]- since patients Prior Auth was approved confirmed with pharmacy that patient has no copay. And patient can get prescription at no charge.  ? ?Spoke with Dr. Harl Bowie- will just keep patient on Entresto 24/26.  ? ?Attempted to call patient to make aware but unable to reach. Left message for patient to call back.  ?

## 2021-06-14 NOTE — Telephone Encounter (Signed)
Attempted to call patient, left message for patient to call back to office.   

## 2021-06-17 ENCOUNTER — Telehealth: Payer: Self-pay | Admitting: Licensed Clinical Social Worker

## 2021-06-17 NOTE — Telephone Encounter (Signed)
Attempted again to reach pt at [phone].  ?Pt was aware that I was trying to reach her and multiple messages left to attempt to assist with financial assistance applications. No engagement at this time. I will mail hardship application as pt is currently insured with > $5,000 in medical bills. I remain available, pt should speak with [facility] [employer] regarding application once received- I will make note of this at the top.  ? ?Westley Hummer, MSW, LCSW ?Clinical Social Worker II ?[place] [facility] Navigation  ?[phone]- work cell phone (preferred) ?[phone]- desk phone ? ?

## 2021-06-18 NOTE — Telephone Encounter (Signed)
Attempted to call patient, left message for patient to call back to office.   

## 2021-06-25 ENCOUNTER — Other Ambulatory Visit: Payer: Self-pay | Admitting: Family Medicine

## 2021-06-25 DIAGNOSIS — F411 Generalized anxiety disorder: Secondary | ICD-10-CM

## 2021-07-01 NOTE — Telephone Encounter (Signed)
Attempted to call patient, left message for patient to call back to office.   

## 2021-08-06 ENCOUNTER — Other Ambulatory Visit: Payer: Self-pay | Admitting: Family Medicine

## 2021-08-06 DIAGNOSIS — F411 Generalized anxiety disorder: Secondary | ICD-10-CM

## 2021-08-08 ENCOUNTER — Telehealth (HOSPITAL_COMMUNITY): Payer: BC Managed Care – PPO | Admitting: Psychiatry

## 2021-08-13 ENCOUNTER — Encounter: Payer: Self-pay | Admitting: *Deleted

## 2021-08-14 ENCOUNTER — Telehealth (INDEPENDENT_AMBULATORY_CARE_PROVIDER_SITE_OTHER): Payer: BC Managed Care – PPO | Admitting: Psychiatry

## 2021-08-14 ENCOUNTER — Encounter (HOSPITAL_COMMUNITY): Payer: Self-pay | Admitting: Psychiatry

## 2021-08-14 DIAGNOSIS — F4321 Adjustment disorder with depressed mood: Secondary | ICD-10-CM

## 2021-08-14 DIAGNOSIS — F411 Generalized anxiety disorder: Secondary | ICD-10-CM | POA: Diagnosis not present

## 2021-08-14 DIAGNOSIS — Z634 Disappearance and death of family member: Secondary | ICD-10-CM | POA: Diagnosis not present

## 2021-08-14 DIAGNOSIS — F32A Depression, unspecified: Secondary | ICD-10-CM | POA: Diagnosis not present

## 2021-08-14 MED ORDER — FLUOXETINE HCL 40 MG PO CAPS: 80.0000 mg | ORAL_CAPSULE | Freq: Every day | ORAL | 3 refills | Status: DC

## 2021-08-14 MED ORDER — MIRTAZAPINE 30 MG PO TABS: 30.0000 mg | ORAL_TABLET | Freq: Every day | ORAL | 3 refills | Status: DC

## 2021-08-14 MED ORDER — HYDROXYZINE PAMOATE 50 MG PO CAPS: 50.0000 mg | ORAL_CAPSULE | Freq: Three times a day (TID) | ORAL | 3 refills | Status: DC | PRN

## 2021-08-14 NOTE — Progress Notes (Signed)
[place] MD/PA/NP OP Progress Note Virtual Visit via Video Note  I connected with [name] on 08/14/21 at  3:30 PM EDT by a video enabled telemedicine application and verified that I am speaking with the correct person using two identifiers.  Location: Patient: Home Provider: Clinic   I discussed the limitations of evaluation and management by telemedicine and the availability of in person appointments. The patient expressed understanding and agreed to proceed.  I provided 30 minutes of non-face-to-face time during this encounter.      08/14/2021 4:30 PM [name]  MRN:  [id number]  Chief Complaint: "Since mothers day thing have been hard"  HPI: 51 year old female seen today for follow up psychiatric evaluation.  She has a psychiatric history of OCD, adjustment disorder, anxiety, depression, SI, and bipolar disorder.  She is currently being managed on Prozac 80 mg daily, Vistaril 25 mg three times daily, and mirtazapine 30 mg at bedtime.  She notes that her medications are effective in managing her psychiatric conditions.  Today patient was well groomed, pleasant, cooperative, engaged in conversation, and pain eye contact.  She informed Probation officer that has been hard this day.  She notes that she continues to grieve the loss of her passed away 7 years ago and her mother.  She informed Probation officer that this Monday she was unable to go to work due to grief.  Patient notes that she feels that she should talk to someone but does not like to disclose her business.  At this time she is not interested in therapy.  She reports that she feels that she can cope with her increased grief. She notes that her anxiety and depression are manageable.  Patient requested not to do a GAD-7 or PHQ-9 today.  She endorses adequate sleep and appetite.  Today she denies SI/HI/VAH, mania, or paranoia.    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  She also notes that she is looking forward to spending time with her deceased sons children.      At this time no medication changed.  Patient agreeable to continue medication as prescribed.  No other concerns noted at this time.  Visit Diagnosis:    ICD-10-CM   1. Grief at loss of child  F43.21    Z63.4     2. Generalized anxiety disorder  F41.1 FLUoxetine (PROZAC) 40 MG capsule    hydrOXYzine (VISTARIL) 50 MG capsule    3. Mild depression  F32.A FLUoxetine (PROZAC) 40 MG capsule    mirtazapine (REMERON) 30 MG tablet      Past Psychiatric History: OCD, adjustment disorder, anxiety, depression, SI, and bipolar disorder.   Past Medical History:  Past Medical History:  Diagnosis Date   Acute renal failure ([place]) 05/27/10   hemodialysis for 6 weeks   Anxiety    Asthma    Depression    Diabetes mellitus    Hypertension    Rhabdomyolysis 06/06/10   after drug overdose   Suicide attempt by drug ingestion ([place]) 06/05/10   result rhabdomyolosis and ARF requrining dialysis     Past Surgical History:  Procedure Laterality Date   ANKLE ARTHROSCOPY Right 08/17/2015   Procedure: RIGHT ANKLE ARTHROSCOPY WITH SYNOVECTOMY AND LOOSE BODY EXCISION;  Surgeon: Melrose Nakayama, MD;  Location: [facility];  Service:  Orthopedics;  Laterality: Right;   CHOLECYSTECTOMY     TUBAL LIGATION  1998    Family Psychiatric History: Unknown  Family History:  Family History  Problem Relation Age of Onset   Asthma Father     Social History:  Social History   Socioeconomic History   Marital status: Married    Spouse name: Not on file   Number of children: 2   Years of education: Not on file   Highest education level: Not on file  Occupational History   Occupation: Corporate treasurer  Tobacco Use   Smoking status: Never   Smokeless tobacco: Never  Vaping Use   Vaping Use: Never used  Substance and  Sexual Activity   Alcohol use: No   Drug use: No   Sexual activity: Yes    Comment: with husband   Other Topics Concern   Not on file  Social History Narrative   Married high school boyfriend at age 46, two boys, still married but he has been living with other women for years.   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  Nephro recommended avoiding use.   Haldol [Haloperidol Lactate] Other (See Comments)    Tardive dyskinesia   Nsaids Other (See Comments)    Nephro recommended avoiding after acute renal failure requiring hemodialysis associated with a suicide attempt.   Latex Other (See Comments)    Rash around IV site    Metabolic Disorder Labs: Lab Results  Component Value Date   HGBA1C 11.0 (A) 04/15/2021   MPG 243.17 11/14/2017   MPG 186 05/09/2015   No results found for: PROLACTIN Lab Results  Component Value Date   CHOL 201 (H) 04/15/2021   TRIG 231 (H) 04/15/2021   HDL 49 04/15/2021   CHOLHDL 4.1 04/15/2021   VLDL 46 (H) 04/10/2015   LDLCALC 112 (H) 04/15/2021   LDLCALC 125 (H) 12/19/2020   Lab Results  Component Value Date   TSH 2.689 05/19/2021   TSH 4.640 (H) 04/15/2021    Therapeutic Level Labs: No results found for: LITHIUM No results found for: VALPROATE No components found for:  CBMZ  Current Medications: Current Outpatient Medications  Medication Sig Dispense Refill   albuterol (PROVENTIL) (2.5 MG/3ML) 0.083% nebulizer solution Take 3 mLs by nebulization 3 (three) times daily as needed. 360 mL 0    albuterol (VENTOLIN HFA) 108 (90 Base) MCG/ACT inhaler Inhale 2 puffs into the lungs every 4 (four) hours as needed for wheezing or shortness of breath. 18 g 0   famotidine (PEPCID) 20 MG tablet Take 20 mg by mouth 2 (two) times daily.     FLUoxetine (PROZAC) 40 MG capsule Take 2 capsules (80 mg total) by mouth daily. 60 capsule 3   fluticasone (FLONASE) 50 MCG/ACT nasal spray Place 1 spray into both nostrils daily. 1 spray in each nostril every day 16 g 1   fluticasone furoate-vilanterol (BREO ELLIPTA) 200-25 MCG/ACT AEPB Inhale 1 puff into the lungs daily. 1 each 0  furosemide (LASIX) 40 MG tablet Take 1 tablet (40 mg total) by mouth 2 (two) times daily. 60 tablet 3   hydrOXYzine (VISTARIL) 50 MG capsule Take 1 capsule (50 mg total) by mouth 3 (three) times daily as needed for itching. 90 capsule 3   insulin aspart (NOVOLOG) 100 UNIT/ML FlexPen Inject 10 units with Lunch and 15 units with Dinner (Patient taking differently: 15-20 Units in the morning and at bedtime. Inject 15 units with Lunch and 20 units with Dinner) 15 mL 11   Insulin Glargine (BASAGLAR KWIKPEN) 100 UNIT/ML Inject 65 Units into the skin 2 (two) times daily. Take 65U twice a day 15 mL 6   isosorbide-hydrALAZINE (BIDIL) 20-37.5 MG tablet Take 1 tablet by mouth 3 (three) times daily. 90 tablet 3   levothyroxine (SYNTHROID) 300 MCG tablet Take 1 tablet by mouth once daily (Patient taking differently: Take 300 mcg by mouth daily before breakfast.) 90 tablet 0   LORazepam (ATIVAN) 1 MG tablet TAKE 1 TABLET BY MOUTH EVERY 8 HOURS AS NEEDED FOR ANXIETY 45 tablet 0   metoprolol tartrate (LOPRESSOR) 25 MG tablet Take 1 tablet (25 mg total) by mouth 2 (two) times daily. 60 tablet 3   mirtazapine (REMERON) 30 MG tablet Take 1 tablet (30 mg total) by mouth daily. 30 tablet 3   rosuvastatin (CRESTOR) 10 MG tablet Take 1 tablet (10 mg total) by mouth daily. 90 tablet 3   sacubitril-valsartan (ENTRESTO) 24-26 MG Take 1 tablet by mouth 2 (two)  times daily. (Patient not taking: Reported on 06/10/2021) 60 tablet 3   Semaglutide,0.25 or 0.5MG /DOS, (OZEMPIC, 0.25 OR 0.5 MG/DOSE,) 2 MG/1.5ML SOPN Inject 0.5 mg into the skin once a week. 1.5 mL 3   Vitamin D, Ergocalciferol, (DRISDOL) 50000 units CAPS capsule Take 1 capsule (50,000 Units total) by mouth every 7 (seven) days. Sunday (Patient not taking: Reported on 06/10/2021) 12 capsule 0   No current facility-administered medications for this visit.     Musculoskeletal: Strength & Muscle Tone: within normal limits Gait & Station: normal Patient leans: N/A  Psychiatric Specialty Exam: Review of Systems  There were no vitals taken for this visit.There is no height or weight on file to calculate BMI.  General Appearance: Well Groomed  Eye Contact:  Good  Speech:  Clear and Coherent and Normal Rate  Volume:  Normal  Mood:  Euthymic, grief  Affect:  Appropriate and Congruent  Thought Process:  Coherent, Goal Directed and Linear  Orientation:  Full (Time, Place, and Person)  Thought Content: WDL and Logical   Suicidal Thoughts:  No  Homicidal Thoughts:  No  Memory:  Immediate;   Good Recent;   Good Remote;   Good  Judgement:  Good  Insight:  Good  Psychomotor Activity:  Normal  Concentration:  Concentration: Good and Attention Span: Good  Recall:  Good  Fund of Knowledge: Good  Language: Good  Akathisia:  No  Handed:  Right  AIMS (if indicated): Not done  Assets:  Communication Skills Desire for Improvement Financial Resources/Insurance Housing Social Support  ADL's:  Intact  Cognition: WNL  Sleep:  Good   Screenings: GAD-7    Flowsheet Row Video Visit from 08/30/2020 in [facility] Video Visit from 06/07/2020 in [facility]  Total GAD-7 Score 2 21      PHQ2-9    [place] Office Visit from 06/10/2021 in [place] Office Visit from 05/24/2021 in [facility]  Office Visit from 04/19/2021 in [place] Office Visit from 04/15/2021 in [place] Office Visit from 03/28/2021 in [place]  PHQ-2 Total Score 0 0 0 0 0  PHQ-9 Total Score 0 0 0 2 1      Flowsheet Row ED to Hosp-Admission (Discharged) from 05/19/2021 in [facility] 4E CV [place] ED to Hosp-Admission (Discharged) from 04/25/2021 in [place] ED from 04/23/2021 in [facility] [place] No Risk No Risk No Risk        Assessment and Plan: Patient notes that her anxiety and depression has since Mother's Day due to grieving the loss of her mother and son.  She however reports  that she can cope with it.  Np medication changes made today.  Patient agreeable to continue medications as prescribed.  1. Generalized anxiety disorder  Continue- FLUoxetine (PROZAC) 40 MG capsule; Take 2 capsules (80 mg total) by mouth daily.  Dispense: 60 capsule; Refill: 3 Continue- hydrOXYzine (VISTARIL) 50 MG capsule; Take 1 capsule (50 mg total) by mouth 3 (three) times daily as needed for itching.  Dispense: 90 capsule; Refill: 3  2. Mild depression  Continue- FLUoxetine (PROZAC) 40 MG capsule; Take 2 capsules (80 mg total) by mouth daily.  Dispense: 60 capsule; Refill: 3 Continue- mirtazapine (REMERON) 30 MG tablet; Take 1 tablet (30 mg total) by mouth daily.  Dispense: 30 tablet; Refill: 3  3. Grief at loss of child   Follow-up in 3 months  Salley Slaughter, NP 08/14/2021, 4:30 PM

## 2021-09-03 ENCOUNTER — Telehealth (HOSPITAL_COMMUNITY): Payer: Self-pay | Admitting: Internal Medicine

## 2021-09-04 ENCOUNTER — Ambulatory Visit (HOSPITAL_COMMUNITY): Payer: BC Managed Care – PPO

## 2021-09-06 ENCOUNTER — Other Ambulatory Visit: Payer: Self-pay | Admitting: Family Medicine

## 2021-09-06 DIAGNOSIS — F411 Generalized anxiety disorder: Secondary | ICD-10-CM

## 2021-09-19 NOTE — Progress Notes (Unsigned)
SUBJECTIVE:   CHIEF COMPLAINT / HPI:   Type 2 Diabetes Mellitus - Last A1c 11 in February - Medications: Insulin basaglar 65 units BID, Jardiance 25 mg daily, metformin 1000 mg twice daily - Compliance: Good - Checking BG at home: Only once or twice a week - Diet: "trying to stay away from the bad and do the good" - Exercise: Limited by her arthritis - Eye exam: Due - Foot exam: Due - Microalbumin: UTD - Statin: Rosuvastatin 10 mg; last LDL in February 112 - Sometimes has symptoms of low sugars- sweats profusely. Has happened twice since last visit. Doesn't check CBG during these times.   Toe Concerns  Feels that she has a fungal infection on her toes. Her toenails are thickened and she cant cut them. She has pain over her toenails. She is wondering if she needs a topical or oral medication for this.   Health Maintenance Due for Pap smear, colonoscopy (previously declined), shingrix vaccine. Ophthalmology referral and mammogram ordered in February.  PERTINENT  PMH / PSH: Hypertension, CHF, hypothyroidism, CKD stage IIIb, bipolar  OBJECTIVE:   BP 132/78   Pulse 98   Wt (!) 390 lb (176.9 kg)   LMP 09/10/2021   SpO2 98%   BMI 54.39 kg/m   Vitals:   10/01/21 1117 10/01/21 1159  BP: (!) 142/80 132/78  Pulse: 98   SpO2: 98%     General: Intermittently tearful, able to participate in exam Respiratory: Normal respiratory effort  Foot exam: Onychomycosis of toes b/l. Unable to sense monofilament on all parts of b/l feet. Calloused heels.  PT and DP pulses intact BL.   Psych: Down, depressed, intermittently tearful     10/01/2021   11:17 AM 06/10/2021    3:13 PM 05/24/2021    2:13 PM  Depression screen PHQ 2/9  Decreased Interest 2 0 0  Down, Depressed, Hopeless 2 0 0  PHQ - 2 Score 4 0 0  Altered sleeping 2 0 0  Tired, decreased energy 2 0 0  Change in appetite 2 0 0  Feeling bad or failure about yourself  2 0 0  Trouble concentrating 2 0 0  Moving slowly or  fidgety/restless 0 0 0  Suicidal thoughts 0 0 0  PHQ-9 Score 14 0 0     ASSESSMENT/PLAN:   Essential hypertension Elevated on initial check but improved on repeat. -Continue current medications.   Type II diabetes mellitus with complication (HCC) Foot exam performed today, patient has neuropathy of both feet. Encouraged her to check her feet frequently or to have husband check if she is unable to see. Congratulated patient on improved A1c today. She is doing well though does not check her sugars often. -New glucometer kit sent to pharmacy today with test strips and lancets -Increase Ozempic to 0.5 mg  Weekly; patient to contact me in 4 weeks for increased dose if doing well -Continue Basaglar 65U BID, Novolog 15U with lunch and 20U with dinner -Increasing Rosuvastatin to 20 mg  -Start ARB pending CMP -F/u in 3 months  HLD (hyperlipidemia) LDL not at goal.  -Increase Rosuvastatin to 20 mg  -Repeat direct LDL in 6 weeks   Onychomycosis Thickened toenails on b/l feet, consistent with onychomycosis. Will check CMP today, if liver function is normal will start Terbinafine for 12 weeks. Patient aware that it will take time to see results.  Congestive heart failure (HCC) Near dry weight today. Reports difficulty affording cardiology visits due to high co-pay.  She has also stopped her Entresto due to side effects if itching that subsided after stopping.  -CMP today, if creatinine stable will start Olmesartan and have patient f/u for repeat BMP in 1 week  -Continue Metoprolol, BiDil, Lasix  -Avoiding Spironolactone given hx of hyperkalemia and CKD -Consider starting jardiance   Depression Patient was intermittently tearful today. She reports difficulty moving around due to her arthritis. Feels that she is undergoing a flare currently. We were able to discuss the positives today including no recent asthma flares and improvement in her A1c which brought her some joy. She is working towards  weight loss so that down the road she may qualify for joint replacements. She has no thoughts of SI or plan for it. Continues to follow with Psychiatry and is on medications.    Health Maintenance Previously declined colonoscopy and Cologaurd. Patient to schedule an appointment for Pap.  [name], [place]

## 2021-09-24 ENCOUNTER — Ambulatory Visit: Payer: BC Managed Care – PPO | Admitting: Internal Medicine

## 2021-09-30 NOTE — Patient Instructions (Incomplete)
It was wonderful to see you today.  Please bring ALL of your medications with you to every visit.   Today we talked about:  -Your A1c was 8.9!! This is a huge improvement from the last time when it was 11! -Another win is that your asthma has been really well controlled!! -I have sent in [place] at in increased dose. Let me know in 4 weeks via MyChart if you are doing well with it and we can go up in dose again. -I have sent in a glucose monitor, test strips and lancets for you to check your sugars. Ideally, I would like for you to check them prior to giving yourself insulin. Write your numbers down in a notepad or on your phone to track. -Come back and see me in 3 months for another diabetes check. -We are checking on your liver function today. If normal, I will send in the medication for your toes. Take it once a day for 12 weeks. It will take time to see improvements.  -We are increasing your cholesterol medication to 20 mg. We can recheck a level in 6 weeks.    Thank you for choosing [facility].   Please call [phone] with any questions about today's appointment.  Please be sure to schedule follow up at the front  desk before you leave today.   [name], DO PGY-3 Family Medicine

## 2021-10-01 ENCOUNTER — Encounter: Payer: Self-pay | Admitting: Family Medicine

## 2021-10-01 ENCOUNTER — Ambulatory Visit (INDEPENDENT_AMBULATORY_CARE_PROVIDER_SITE_OTHER): Payer: BC Managed Care – PPO | Admitting: Family Medicine

## 2021-10-01 VITALS — BP 132/78 | HR 98 | Wt 390.0 lb

## 2021-10-01 DIAGNOSIS — B351 Tinea unguium: Secondary | ICD-10-CM | POA: Diagnosis not present

## 2021-10-01 DIAGNOSIS — I10 Essential (primary) hypertension: Secondary | ICD-10-CM | POA: Diagnosis not present

## 2021-10-01 DIAGNOSIS — I5042 Chronic combined systolic (congestive) and diastolic (congestive) heart failure: Secondary | ICD-10-CM

## 2021-10-01 DIAGNOSIS — E118 Type 2 diabetes mellitus with unspecified complications: Secondary | ICD-10-CM | POA: Diagnosis not present

## 2021-10-01 DIAGNOSIS — E782 Mixed hyperlipidemia: Secondary | ICD-10-CM

## 2021-10-01 DIAGNOSIS — F32A Depression, unspecified: Secondary | ICD-10-CM

## 2021-10-01 DIAGNOSIS — R399 Unspecified symptoms and signs involving the genitourinary system: Secondary | ICD-10-CM

## 2021-10-01 LAB — POCT URINALYSIS DIP (MANUAL ENTRY)
Bilirubin, UA: NEGATIVE
Glucose, UA: NEGATIVE mg/dL
Ketones, POC UA: NEGATIVE mg/dL
Leukocytes, UA: NEGATIVE
Nitrite, UA: NEGATIVE
Spec Grav, UA: 1.02 (ref 1.010–1.025)
Urobilinogen, UA: 0.2 E.U./dL
pH, UA: 5.5 (ref 5.0–8.0)

## 2021-10-01 LAB — POCT UA - MICROSCOPIC ONLY
RBC, Urine, Miroscopic: NONE SEEN (ref 0–2)
WBC, Ur, HPF, POC: NONE SEEN (ref 0–5)

## 2021-10-01 LAB — POCT GLYCOSYLATED HEMOGLOBIN (HGB A1C): HbA1c, POC (controlled diabetic range): 8.9 % — AB (ref 0.0–7.0)

## 2021-10-01 MED ORDER — BLOOD GLUCOSE MONITOR KIT: PACK | 0 refills | Status: DC

## 2021-10-01 MED ORDER — SEMAGLUTIDE(0.25 OR 0.5MG/DOS) 2 MG/1.5ML ~~LOC~~ SOPN: 0.5000 mg | PEN_INJECTOR | SUBCUTANEOUS | 1 refills | Status: DC

## 2021-10-01 MED ORDER — ROSUVASTATIN CALCIUM 20 MG PO TABS: 20.0000 mg | ORAL_TABLET | Freq: Every day | ORAL | 3 refills | Status: DC

## 2021-10-01 NOTE — Assessment & Plan Note (Signed)
>>  ASSESSMENT AND PLAN FOR HTN (HYPERTENSION) WRITTEN ON 10/01/2021  1:16 PM BY ESPINOZA, ALEJANDRA, DO  Elevated on initial check but improved on repeat. -Continue current medications.

## 2021-10-01 NOTE — Assessment & Plan Note (Deleted)
EF 40-45%, dry weight ~387 lbs.

## 2021-10-01 NOTE — Assessment & Plan Note (Signed)
Foot exam performed today, patient has neuropathy of both feet. Encouraged her to check her feet frequently or to have husband check if she is unable to see. Congratulated patient on improved A1c today. She is doing well though does not check her sugars often. -New glucometer kit sent to pharmacy today with test strips and lancets -Increase Ozempic to 0.5 mg  Weekly; patient to contact me in 4 weeks for increased dose if doing well -Continue Basaglar 65U BID, Novolog 15U with lunch and 20U with dinner -Increasing Rosuvastatin to 20 mg  -Start ARB pending CMP -F/u in 3 months

## 2021-10-01 NOTE — Assessment & Plan Note (Signed)
Patient was intermittently tearful today. She reports difficulty moving around due to her arthritis. Feels that she is undergoing a flare currently. We were able to discuss the positives today including no recent asthma flares and improvement in her A1c which brought her some joy. She is working towards weight loss so that down the road she may qualify for joint replacements. She has no thoughts of SI or plan for it. Continues to follow with Psychiatry and is on medications.

## 2021-10-01 NOTE — Assessment & Plan Note (Signed)
Elevated on initial check but improved on repeat. -Continue current medications.

## 2021-10-01 NOTE — Assessment & Plan Note (Signed)
LDL not at goal.  -Increase Rosuvastatin to 20 mg  -Repeat direct LDL in 6 weeks

## 2021-10-01 NOTE — Assessment & Plan Note (Signed)
>>  ASSESSMENT AND PLAN FOR MDD (MAJOR DEPRESSIVE DISORDER) WRITTEN ON 10/01/2021  1:29 PM BY Sabino Dick, DO  Patient was intermittently tearful today. She reports difficulty moving around due to her arthritis. Feels that she is undergoing a flare currently. We were able to discuss the positives today including no recent asthma flares and improvement in her A1c which brought her some joy. She is working towards weight loss so that down the road she may qualify for joint replacements. She has no thoughts of SI or plan for it. Continues to follow with Psychiatry and is on medications.

## 2021-10-01 NOTE — Assessment & Plan Note (Signed)
Near dry weight today. Reports difficulty affording cardiology visits due to high co-pay. She has also stopped her Entresto due to side effects if itching that subsided after stopping.  -CMP today, if creatinine stable will start Olmesartan and have patient f/u for repeat BMP in 1 week  -Continue Metoprolol, BiDil, Lasix  -Avoiding Spironolactone given hx of hyperkalemia and CKD -Consider starting jardiance

## 2021-10-01 NOTE — Assessment & Plan Note (Signed)
Thickened toenails on b/l feet, consistent with onychomycosis. Will check CMP today, if liver function is normal will start Terbinafine for 12 weeks. Patient aware that it will take time to see results.

## 2021-10-02 LAB — COMPREHENSIVE METABOLIC PANEL
ALT: 14 IU/L (ref 0–32)
AST: 24 IU/L (ref 0–40)
Albumin/Globulin Ratio: 1.2 (ref 1.2–2.2)
Albumin: 4.2 g/dL (ref 3.8–4.9)
Alkaline Phosphatase: 57 IU/L (ref 44–121)
BUN/Creatinine Ratio: 14 (ref 9–23)
BUN: 26 mg/dL — ABNORMAL HIGH (ref 6–24)
Bilirubin Total: 0.2 mg/dL (ref 0.0–1.2)
CO2: 22 mmol/L (ref 20–29)
Calcium: 9.6 mg/dL (ref 8.7–10.2)
Chloride: 100 mmol/L (ref 96–106)
Creatinine, Ser: 1.84 mg/dL — ABNORMAL HIGH (ref 0.57–1.00)
Globulin, Total: 3.4 g/dL (ref 1.5–4.5)
Glucose: 200 mg/dL — ABNORMAL HIGH (ref 70–99)
Potassium: 5.4 mmol/L — ABNORMAL HIGH (ref 3.5–5.2)
Sodium: 135 mmol/L (ref 134–144)
Total Protein: 7.6 g/dL (ref 6.0–8.5)
eGFR: 33 mL/min/{1.73_m2} — ABNORMAL LOW (ref >59)

## 2021-10-03 ENCOUNTER — Other Ambulatory Visit: Payer: Self-pay | Admitting: Family Medicine

## 2021-10-03 DIAGNOSIS — B351 Tinea unguium: Secondary | ICD-10-CM

## 2021-10-03 DIAGNOSIS — E875 Hyperkalemia: Secondary | ICD-10-CM

## 2021-10-03 MED ORDER — TERBINAFINE HCL 250 MG PO TABS: 250.0000 mg | ORAL_TABLET | Freq: Every day | ORAL | 0 refills | Status: DC

## 2021-10-11 ENCOUNTER — Other Ambulatory Visit: Payer: Self-pay | Admitting: Family Medicine

## 2021-10-11 DIAGNOSIS — F411 Generalized anxiety disorder: Secondary | ICD-10-CM

## 2021-11-12 ENCOUNTER — Telehealth (INDEPENDENT_AMBULATORY_CARE_PROVIDER_SITE_OTHER): Payer: BC Managed Care – PPO | Admitting: Psychiatry

## 2021-11-12 ENCOUNTER — Encounter (HOSPITAL_COMMUNITY): Payer: Self-pay | Admitting: Psychiatry

## 2021-11-12 ENCOUNTER — Telehealth: Payer: Self-pay

## 2021-11-12 DIAGNOSIS — F411 Generalized anxiety disorder: Secondary | ICD-10-CM | POA: Diagnosis not present

## 2021-11-12 DIAGNOSIS — F32A Depression, unspecified: Secondary | ICD-10-CM

## 2021-11-12 MED ORDER — FLUOXETINE HCL 40 MG PO CAPS: 80.0000 mg | ORAL_CAPSULE | Freq: Every day | ORAL | 3 refills | Status: DC

## 2021-11-12 MED ORDER — HYDROXYZINE PAMOATE 50 MG PO CAPS: 50.0000 mg | ORAL_CAPSULE | Freq: Three times a day (TID) | ORAL | 3 refills | Status: DC | PRN

## 2021-11-12 MED ORDER — MIRTAZAPINE 30 MG PO TABS: 30.0000 mg | ORAL_TABLET | Freq: Every day | ORAL | 3 refills | Status: DC

## 2021-11-12 NOTE — Progress Notes (Signed)
BH MD/PA/NP OP Progress Note Virtual Visit via Telephone Note  I connected with [name] on 11/12/21 at  3:30 PM EDT by telephone and verified that I am speaking with the correct person using two identifiers.  Location: Patient: home Provider: Clinic   I discussed the limitations, risks, security and privacy concerns of performing an evaluation and management service by telephone and the availability of in person appointments. I also discussed with the patient that there may be a patient responsible charge related to this service. The patient expressed understanding and agreed to proceed.   I provided 30 minutes of non-face-to-face time during this encounter.      11/12/2021 3:47 PM [name]  MRN:  [id number]  Chief Complaint: "Things are great"  HPI: 51 year old female seen today for follow up psychiatric evaluation.  She has a psychiatric history of OCD, adjustment disorder, anxiety, depression, SI, and bipolar disorder.  She is currently being managed on Prozac 80 mg daily, Vistaril 25 mg three times daily, and mirtazapine 30 mg at bedtime.  She notes that her medications are effective in managing her psychiatric conditions.  Today patient was unable to login virtually so her assessment was done over the phone. During exam she was pleasant, cooperative, and engaged in conversation. She informed Probation officer that she has been in good spirits. She notes that things have been great. 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 Overall patient notes that her anxiety and depression has been well managed conducted GAD-7 and patient scored a 5.  Provider also conducted PHQ-9 and patient scored a 4.  She endorses adequate sleep and appetite.  Patient notes that since her last visit she has been eating healthy and has lost approximately 5 pounds.  Today she denies SI/HI/VAH, mania, or paranoia.       At this time no medication  changed.  Patient agreeable to continue medication as prescribed.  No other concerns noted at this time.  Visit Diagnosis:    ICD-10-CM   1. Generalized anxiety disorder  F41.1     2. Mild depression  F32.A       Past Psychiatric History: OCD, adjustment disorder, anxiety, depression, SI, and bipolar disorder.   Past Medical History:  Past Medical History:  Diagnosis Date   Acute renal failure ([place]) 05/27/10   hemodialysis for 6 weeks   Anxiety    Asthma    Depression    Diabetes mellitus    Hypertension    Rhabdomyolysis 06/06/10   after drug overdose   Suicide attempt by drug ingestion ([place]) 06/05/10   result rhabdomyolosis and ARF requrining dialysis     Past Surgical History:  Procedure Laterality Date   ANKLE ARTHROSCOPY Right 08/17/2015   Procedure: RIGHT ANKLE ARTHROSCOPY WITH SYNOVECTOMY AND LOOSE BODY EXCISION;  Surgeon: Melrose Nakayama, MD;  Location: [place];  Service: Orthopedics;  Laterality: Right;   CHOLECYSTECTOMY     TUBAL LIGATION  1998    Family Psychiatric History: Unknown  Family History:  Family History  Problem Relation Age of Onset   Asthma Father     Social History:  Social History   Socioeconomic History   Marital status: Married    Spouse name: Not on file   Number of children: 2   Years of education: Not on file   Highest education level: Not on file  Occupational History   Occupation: Corporate treasurer  Tobacco Use  Smoking status: Never    Passive exposure: Never   Smokeless tobacco: Never  Vaping Use   Vaping Use: Never used  Substance and Sexual Activity   Alcohol use: No   Drug use: No   Sexual activity: Yes    Comment: with husband   Other Topics Concern   Not on file  Social History Narrative   Married high school boyfriend at age 55, two boys, still married but he has been living with other women for years.   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  Nephro recommended avoiding use.   Haldol [Haloperidol Lactate] Other (See Comments)    Tardive dyskinesia   Nsaids Other (See Comments)    Nephro recommended avoiding after acute renal failure requiring hemodialysis associated with a suicide attempt.   Latex Other (See Comments)    Rash around IV site    Metabolic Disorder Labs: Lab Results  Component Value Date   HGBA1C 8.9 (A) 10/01/2021   MPG 243.17 11/14/2017   MPG 186 05/09/2015   No results found for: "PROLACTIN" Lab Results  Component Value Date   CHOL 201 (H) 04/15/2021   TRIG 231 (H) 04/15/2021   HDL 49 04/15/2021   CHOLHDL 4.1 04/15/2021   VLDL 46 (H) 04/10/2015   LDLCALC 112 (H) 04/15/2021   LDLCALC 125 (H) 12/19/2020   Lab Results  Component Value Date   TSH 2.689 05/19/2021   TSH 4.640 (H) 04/15/2021    Therapeutic Level Labs: No results found for: "LITHIUM" No results found for: "VALPROATE" No results found for: "CBMZ"  Current Medications: Current Outpatient Medications  Medication Sig Dispense Refill   albuterol (PROVENTIL) (2.5 MG/3ML) 0.083% nebulizer solution Take 3 mLs by nebulization 3 (three) times daily as needed. 360 mL 0   albuterol (VENTOLIN HFA) 108 (90 Base) MCG/ACT inhaler Inhale 2 puffs into the lungs every 4 (four) hours as needed for wheezing or shortness of breath. 18 g 0   blood glucose meter kit and supplies KIT Dispense based on patient and insurance preference. Use up to four times daily as directed. 1 each 0   famotidine (PEPCID) 20 MG tablet Take 20 mg by mouth 2 (two) times daily.      FLUoxetine (PROZAC) 40 MG capsule Take 2 capsules (80 mg total) by mouth daily. 60 capsule 3   fluticasone (FLONASE) 50 MCG/ACT nasal spray Place 1 spray into both nostrils daily. 1 spray in each nostril every day 16 g 1   fluticasone furoate-vilanterol (BREO ELLIPTA) 200-25 MCG/ACT AEPB Inhale 1 puff into the lungs daily. 1 each 0   furosemide (LASIX) 40 MG tablet Take 1 tablet (40 mg total) by mouth 2 (two) times daily. 60 tablet 3   hydrOXYzine (VISTARIL) 50 MG capsule Take 1 capsule (50 mg total) by mouth 3 (three) times daily as needed for itching. 90 capsule 3   insulin aspart (NOVOLOG) 100 UNIT/ML FlexPen Inject 10 units with Lunch and 15 units with Dinner (Patient  taking differently: 15-20 Units in the morning and at bedtime. Inject 15 units with Lunch and 20 units with Dinner) 15 mL 11   Insulin Glargine (BASAGLAR KWIKPEN) 100 UNIT/ML Inject 65 Units into the skin 2 (two) times daily. Take 65U twice a day 15 mL 6   isosorbide-hydrALAZINE (BIDIL) 20-37.5 MG tablet Take 1 tablet by mouth 3 (three) times daily. 90 tablet 3   levothyroxine (SYNTHROID) 300 MCG tablet Take 1 tablet by mouth once daily (Patient taking differently: Take 300 mcg by mouth daily before breakfast.) 90 tablet 0   LORazepam (ATIVAN) 1 MG tablet TAKE 1 TABLET BY MOUTH EVERY 8 HOURS AS NEEDED FOR ANXIETY 45 tablet 0   metoprolol tartrate (LOPRESSOR) 25 MG tablet Take 1 tablet (25 mg total) by mouth 2 (two) times daily. 60 tablet 3   mirtazapine (REMERON) 30 MG tablet Take 1 tablet (30 mg total) by mouth daily. 30 tablet 3   rosuvastatin (CRESTOR) 20 MG tablet Take 1 tablet (20 mg total) by mouth daily. 90 tablet 3   sacubitril-valsartan (ENTRESTO) 24-26 MG Take 1 tablet by mouth 2 (two) times daily. (Patient not taking: Reported on 06/10/2021) 60 tablet 3   Semaglutide,0.25 or 0.5MG/DOS, 2 MG/1.5ML SOPN Inject 0.5 mg into the skin once a week. Take 0.5 mg weekly for at least 4 weeks, if you are doing well we can increase the  dose. 3 mL 1   terbinafine (LAMISIL) 250 MG tablet Take 1 tablet (250 mg total) by mouth daily. 84 tablet 0   Vitamin D, Ergocalciferol, (DRISDOL) 50000 units CAPS capsule Take 1 capsule (50,000 Units total) by mouth every 7 (seven) days. Sunday 12 capsule 0   No current facility-administered medications for this visit.     Musculoskeletal: Strength & Muscle Tone: within normal limits Gait & Station: normal Patient leans: N/A  Psychiatric Specialty Exam: Review of Systems  There were no vitals taken for this visit.There is no height or weight on file to calculate BMI.  General Appearance: Well Groomed  Eye Contact:  Good  Speech:  Clear and Coherent and Normal Rate  Volume:  Normal  Mood:  Euthymic, grief  Affect:  Appropriate and Congruent  Thought Process:  Coherent, Goal Directed and Linear  Orientation:  Full (Time, Place, and Person)  Thought Content: WDL and Logical   Suicidal Thoughts:  No  Homicidal Thoughts:  No  Memory:  Immediate;   Good Recent;   Good Remote;   Good  Judgement:  Good  Insight:  Good  Psychomotor Activity:  Normal  Concentration:  Concentration: Good and Attention Span: Good  Recall:  Good  Fund of Knowledge: Good  Language: Good  Akathisia:  No  Handed:  Right  AIMS (if indicated): Not done  Assets:  Communication Skills Desire for Improvement Financial Resources/Insurance Housing Social Support  ADL's:  Intact  Cognition: WNL  Sleep:  Good   Screenings: GAD-7    Flowsheet Row Video Visit from 11/12/2021 in [facility] Video Visit from 08/30/2020 in [facility] Video Visit from 06/07/2020 in [facility]  Total GAD-7 Score _0 PHQ2-9    Flowsheet Row Video Visit from 11/12/2021 in [facility] Office Visit from 10/01/2021 in [place] Office Visit from 06/10/2021 in [place] Office Visit from 05/24/2021 in [place] Office Visit from 04/19/2021 in  [place]  PHQ-2 Total Score 2 4 0 0 0  PHQ-9 Total Score 4 14 0 0 0      Flowsheet Row ED to Hosp-Admission (Discharged) from 05/19/2021 in [facility] 4E CV [place] ED to Hosp-Admission (Discharged) from 04/25/2021 in [place] ED from 04/23/2021 in [facility] [place] No Risk No Risk No Risk        Assessment and Plan: Patient notes that her anxiety and depression has since Mother's Day due to grieving the loss of her mother and son.  She however reports  that she can cope with it.  Np medication changes made today.  Patient agreeable to continue medications as prescribed.  1. Generalized anxiety disorder  Continue- FLUoxetine (PROZAC) 40 MG capsule; Take 2 capsules (80 mg total) by mouth daily.  Dispense: 60 capsule; Refill: 3 Continue- hydrOXYzine (VISTARIL) 50 MG capsule; Take 1 capsule (50 mg total) by mouth 3 (three) times daily as needed for itching.  Dispense: 90 capsule; Refill: 3  2. Mild depression  Continue- FLUoxetine (PROZAC) 40 MG capsule; Take 2 capsules (80 mg total) by mouth daily.  Dispense: 60 capsule; Refill: 3 Continue- mirtazapine (REMERON) 30 MG tablet; Take 1 tablet (30 mg total) by mouth daily.  Dispense: 30 tablet; Refill: 3  3. Grief at loss of child   Follow-up in 3 months  Salley Slaughter, NP 11/12/2021, 3:47 PM

## 2021-11-12 NOTE — Telephone Encounter (Signed)
Patient calls nurse line regarding medication refills. She is requesting increase to quantity 60 of ativan, as sometimes she has to take medication twice daily rather than once daily.   Patient is also needing refill on Ozempic. Patient is currently taking 0.5 mg weekly. She is tolerating this well and is asking if this should be increased to 1 mg weekly.   Forwarding request to PCP. Please advise.   Talbot Grumbling, RN

## 2021-11-13 MED ORDER — LORAZEPAM 1 MG PO TABS: 1.0000 mg | ORAL_TABLET | Freq: Three times a day (TID) | ORAL | 0 refills | Status: DC | PRN

## 2021-11-13 MED ORDER — SEMAGLUTIDE (1 MG/DOSE) 4 MG/3ML ~~LOC~~ SOPN: 1.0000 mg | PEN_INJECTOR | SUBCUTANEOUS | 1 refills | Status: DC

## 2021-11-15 NOTE — Telephone Encounter (Signed)
Called patient. No answer, LVM asking patient to return call to office.   Talbot Grumbling, RN

## 2021-11-28 ENCOUNTER — Other Ambulatory Visit: Payer: BC Managed Care – PPO

## 2021-11-28 DIAGNOSIS — E875 Hyperkalemia: Secondary | ICD-10-CM

## 2021-11-29 LAB — BASIC METABOLIC PANEL
BUN/Creatinine Ratio: 19 (ref 9–23)
BUN: 41 mg/dL — ABNORMAL HIGH (ref 6–24)
CO2: 24 mmol/L (ref 20–29)
Calcium: 9.1 mg/dL (ref 8.7–10.2)
Chloride: 99 mmol/L (ref 96–106)
Creatinine, Ser: 2.16 mg/dL — ABNORMAL HIGH (ref 0.57–1.00)
Glucose: 275 mg/dL — ABNORMAL HIGH (ref 70–99)
Potassium: 5.1 mmol/L (ref 3.5–5.2)
Sodium: 140 mmol/L (ref 134–144)
eGFR: 27 mL/min/{1.73_m2} — ABNORMAL LOW (ref >59)

## 2021-12-11 ENCOUNTER — Other Ambulatory Visit: Payer: Self-pay | Admitting: Family Medicine

## 2021-12-11 DIAGNOSIS — F411 Generalized anxiety disorder: Secondary | ICD-10-CM

## 2021-12-18 ENCOUNTER — Other Ambulatory Visit: Payer: Self-pay | Admitting: Family Medicine

## 2021-12-18 DIAGNOSIS — F411 Generalized anxiety disorder: Secondary | ICD-10-CM

## 2021-12-19 NOTE — Telephone Encounter (Signed)
Patient calls nurse line requesting refill on lorazepam.   Medication pended to encounter.   Talbot Grumbling, RN

## 2021-12-24 ENCOUNTER — Ambulatory Visit (INDEPENDENT_AMBULATORY_CARE_PROVIDER_SITE_OTHER): Payer: BC Managed Care – PPO | Admitting: Family Medicine

## 2021-12-24 ENCOUNTER — Encounter: Payer: Self-pay | Admitting: Family Medicine

## 2021-12-24 VITALS — BP 134/81 | HR 93 | Ht 71.0 in | Wt 386.4 lb

## 2021-12-24 DIAGNOSIS — E118 Type 2 diabetes mellitus with unspecified complications: Secondary | ICD-10-CM

## 2021-12-24 DIAGNOSIS — M25569 Pain in unspecified knee: Secondary | ICD-10-CM | POA: Diagnosis not present

## 2021-12-24 DIAGNOSIS — Z23 Encounter for immunization: Secondary | ICD-10-CM | POA: Diagnosis not present

## 2021-12-24 DIAGNOSIS — I5042 Chronic combined systolic (congestive) and diastolic (congestive) heart failure: Secondary | ICD-10-CM | POA: Diagnosis not present

## 2021-12-24 DIAGNOSIS — N1832 Chronic kidney disease, stage 3b: Secondary | ICD-10-CM

## 2021-12-24 DIAGNOSIS — M1711 Unilateral primary osteoarthritis, right knee: Secondary | ICD-10-CM

## 2021-12-24 LAB — POCT GLYCOSYLATED HEMOGLOBIN (HGB A1C): HbA1c, POC (controlled diabetic range): 9.7 % — AB (ref 0.0–7.0)

## 2021-12-24 MED ORDER — METHYLPREDNISOLONE ACETATE 40 MG/ML IJ SUSP
40.0000 mg | Freq: Once | INTRAMUSCULAR | Status: AC
  Administered 2021-12-24: 40 mg via INTRA_ARTICULAR

## 2021-12-24 MED ORDER — ISOSORB DINITRATE-HYDRALAZINE 20-37.5 MG PO TABS: 1.0000 | ORAL_TABLET | Freq: Three times a day (TID) | ORAL | 3 refills | Status: DC

## 2021-12-24 MED ORDER — METOPROLOL TARTRATE 25 MG PO TABS: 25.0000 mg | ORAL_TABLET | Freq: Two times a day (BID) | ORAL | 3 refills | Status: DC

## 2021-12-24 MED ORDER — OZEMPIC (2 MG/DOSE) 8 MG/3ML ~~LOC~~ SOPN: 2.0000 mg | PEN_INJECTOR | SUBCUTANEOUS | 1 refills | Status: DC

## 2021-12-24 MED ORDER — FUROSEMIDE 40 MG PO TABS: 40.0000 mg | ORAL_TABLET | Freq: Two times a day (BID) | ORAL | 3 refills | Status: DC

## 2021-12-24 NOTE — Patient Instructions (Addendum)
It was wonderful to see you today.  Please bring ALL of your medications with you to every visit.   Today we talked about:  -Your hemoglobin A1c 9.7 -Check your morning fasting sugars around 530 AM (or when you wake up), check again at noon, and again before dinner (around 8PM).  -Write your sugar readings down and bring them.  -Increase your basaglar 75U twice a day -Your next A1c check will be in January.  -We have increased your Ozempic to 2 mg daily  -Do NOT take Ibuprofen, Alleve, or Naproxen as this can worsen your kidneys. -You can use topical therapies for your pain or take Tylenol -Congratulations on the 4 lbs of weight loss! Keep up the great work. -As we discussed[facility] and [facility] would be great for both your mental and physical health! -I hope that the steroid shot gives you some relief. You may notice a spike in your sugars due to the steroid component. Increase your insulin as described above.   Thank you for choosing [facility].   Please call [phone] with any questions about today's appointment.  Please be sure to schedule follow up at the front  desk before you leave today.   [name], DO PGY-3 Family Medicine

## 2021-12-24 NOTE — Assessment & Plan Note (Addendum)
Hgb A1c 9.7 today, slightly worse than previous. Discussed importance of checking sugars, especially while being on insulin. Patient seems motivated to check her sugars and log them.  -Increase Ozempic to 2 mg weekly. She has tolerated the 1 mg without adverse effects. Would benefit from added weight loss and glucose control. -Increase Basaglar to 75U BID -Continue Novolog -Continue statin -Not on entresto  -Repeat A1c in 3 months but will f/u in 1 month to review glucose log and adjust insulin as needed

## 2021-12-24 NOTE — Assessment & Plan Note (Signed)
Most recent BMP last month showed bump in creatinine. This is most likely due to her recent daily NSAID use. We discussed that she should avoid all NSAIDs given her chronic kidney disease. She was understandably saddened by this as they help to relieve some of her pain. Recommended extra strength Tylenol and topical therapies instead. Offered repeat BMP today but patient declined. Will need to monitor.

## 2021-12-24 NOTE — Progress Notes (Signed)
SUBJECTIVE:   CHIEF COMPLAINT / HPI:   [name] is a 51 y.o. female who presents to the [facility] clinic today to discuss the following concerns:   Knee Pain Has flare of her osteoarthritis. Reports that her right knee has been giving her the most trouble lately. She has been taking Naproxen daily for the pain. She has received injections in the past and they help to provide temporary relief. 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   T2DM Diabetes, Type 2 - Last A1c 8.9 in July - Medications: Ozempic 1 mg weekly, NovoLog 15 units with lunch, 20 units with dinner, Basaglar 65 units twice a day. - Blood sugar checks: Checks only 1x/week, would like to do better about this. Has the supplies.  - Compliance: Good - Exercise: Limited by her OA though she is hopeful [facility] and [facility] aerobics soon - Microalbumin: UTD - Statin: On high intensity statin  Wt Readings from Last 3 Encounters:  12/24/21 (!) 386 lb 6.4 oz (175.3 kg)  10/01/21 (!) 390 lb (176.9 kg)  06/10/21 (!) 396 lb (179.6 kg)    PERTINENT  PMH / PSH: T2DM, HTN, osteoarthritis   OBJECTIVE:   BP 134/81   Pulse 93   Ht 5\' 11"  (1.803 m)   Wt (!) 386 lb 6.4 oz (175.3 kg)   LMP 12/03/2021   SpO2 99%   BMI 53.89 kg/m    General: Obese, NAD, pleasant, able to participate in exam Respiratory: Normal respiratory effort on room air R Knee: Osteoarthritic, pain with tenderness around joint line, normal ROM  Skin: warm and dry, no rashes noted Psych: Normal affect and mood  ASSESSMENT/PLAN:   CKD (chronic kidney disease) stage 3, GFR 30-59 ml/min (HCC) Most recent BMP last month showed bump in creatinine. This is most likely due to her recent daily NSAID use. We discussed that she should avoid all NSAIDs given her chronic kidney disease. She was understandably saddened by this as they help to relieve some of her pain. Recommended extra strength Tylenol and topical therapies instead.  Offered repeat BMP today but patient declined. Will need to monitor.   Osteoarthritis Has received referral to both Orthopedics and Bariatric Surgery in the past. Reports that she saw orthopedic provider and they were unable to provide any other solutions at this time. She reports not hearing from the bariatric specialists but she is not interested in weight loss surgeries at this time- she would like to continue working towards weight loss herself. Commended her on her 4 lbs of weight loss. She was amenable to corticosteroid injection today.  -40 mg methylprednisolone injected into right knee without complication  Type II diabetes mellitus with complication (HCC) Hgb Z6X 9.7 today, slightly worse than previous. Discussed importance of checking sugars, especially while being on insulin. Patient seems motivated to check her sugars and log them.  -Increase Ozempic to 2 mg weekly. She has tolerated the 1 mg without adverse effects. Would benefit from added weight loss and glucose control. -Increase Basaglar to 75U BID -Continue Novolog -Continue statin -Not on entresto  -Repeat A1c in 3 months but will f/u in 1 month to review glucose log and adjust insulin as needed   After informed written consent timeout was performed, patient was seated on exam table. Right knee was prepped with alcohol swab and utilizing anteromedial approach, patient's right knee was injected intraarticularly with 3:1 lidocaine: depomedrol. Patient  tolerated the procedure well without immediate complications.  [name], [place]

## 2021-12-24 NOTE — Assessment & Plan Note (Addendum)
Has received referral to both Orthopedics and Bariatric Surgery in the past. Reports that she saw orthopedic provider and they were unable to provide any other solutions at this time. She reports not hearing from the bariatric specialists but she is not interested in weight loss surgeries at this time- she would like to continue working towards weight loss herself. Commended her on her 4 lbs of weight loss. She was amenable to corticosteroid injection today.  -40 mg methylprednisolone injected into right knee without complication

## 2021-12-30 ENCOUNTER — Other Ambulatory Visit: Payer: Self-pay | Admitting: Family Medicine

## 2021-12-30 DIAGNOSIS — B351 Tinea unguium: Secondary | ICD-10-CM

## 2021-12-30 DIAGNOSIS — E038 Other specified hypothyroidism: Secondary | ICD-10-CM

## 2022-01-05 ENCOUNTER — Other Ambulatory Visit: Payer: Self-pay | Admitting: Family Medicine

## 2022-01-08 ENCOUNTER — Other Ambulatory Visit: Payer: Self-pay | Admitting: Family Medicine

## 2022-01-09 ENCOUNTER — Other Ambulatory Visit: Payer: Self-pay | Admitting: Family Medicine

## 2022-01-09 ENCOUNTER — Telehealth: Payer: Self-pay

## 2022-01-09 DIAGNOSIS — E118 Type 2 diabetes mellitus with unspecified complications: Secondary | ICD-10-CM

## 2022-01-09 MED ORDER — SEMAGLUTIDE (1 MG/DOSE) 4 MG/3ML ~~LOC~~ SOPN: 1.0000 mg | PEN_INJECTOR | SUBCUTANEOUS | 1 refills | Status: DC

## 2022-01-09 MED ORDER — OZEMPIC (2 MG/DOSE) 8 MG/3ML ~~LOC~~ SOPN: 2.0000 mg | PEN_INJECTOR | SUBCUTANEOUS | 0 refills | Status: DC

## 2022-01-09 NOTE — Telephone Encounter (Signed)
Patient calls nurse line in regards to Ozempic 2mg .   Patient reports difficulty picking up new dosage.   I called the pharmacy and 2mg  is on back order. They report having stock of 1mg .   Patient is fine with staying at 1mg  until the pharmacy has supply.   Will forward to PCP to send in 1mg .

## 2022-01-09 NOTE — Telephone Encounter (Signed)
Rx sent 

## 2022-01-13 ENCOUNTER — Ambulatory Visit (INDEPENDENT_AMBULATORY_CARE_PROVIDER_SITE_OTHER): Payer: BC Managed Care – PPO | Admitting: Family Medicine

## 2022-01-13 ENCOUNTER — Other Ambulatory Visit: Payer: Self-pay

## 2022-01-13 VITALS — BP 132/79 | HR 98 | Wt 383.0 lb

## 2022-01-13 DIAGNOSIS — Z794 Long term (current) use of insulin: Secondary | ICD-10-CM | POA: Diagnosis not present

## 2022-01-13 DIAGNOSIS — R300 Dysuria: Secondary | ICD-10-CM

## 2022-01-13 DIAGNOSIS — E1140 Type 2 diabetes mellitus with diabetic neuropathy, unspecified: Secondary | ICD-10-CM

## 2022-01-13 LAB — POCT URINALYSIS DIP (MANUAL ENTRY)
Bilirubin, UA: NEGATIVE
Glucose, UA: NEGATIVE mg/dL
Ketones, POC UA: NEGATIVE mg/dL
Leukocytes, UA: NEGATIVE
Nitrite, UA: NEGATIVE
Protein Ur, POC: NEGATIVE mg/dL
Spec Grav, UA: 1.015 (ref 1.010–1.025)
Urobilinogen, UA: 0.2 E.U./dL
pH, UA: 5 (ref 5.0–8.0)

## 2022-01-13 LAB — POCT UA - MICROSCOPIC ONLY: WBC, Ur, HPF, POC: NONE SEEN (ref 0–5)

## 2022-01-13 MED ORDER — FLUCONAZOLE 150 MG PO TABS: 150.0000 mg | ORAL_TABLET | Freq: Once | ORAL | 0 refills | Status: AC

## 2022-01-13 MED ORDER — CEPHALEXIN 500 MG PO CAPS: 500.0000 mg | ORAL_CAPSULE | Freq: Two times a day (BID) | ORAL | 0 refills | Status: AC

## 2022-01-13 MED ORDER — PHENAZOPYRIDINE HCL 95 MG PO TABS: 95.0000 mg | ORAL_TABLET | Freq: Three times a day (TID) | ORAL | 0 refills | Status: DC | PRN

## 2022-01-13 NOTE — Progress Notes (Signed)
    SUBJECTIVE:   CHIEF COMPLAINT / HPI:  Chief Complaint  Patient presents with   Urinary Tract Infection    Patient reports symptoms of dysuria and urinary frequency that started 3 days ago. Has had some midline back discomfort since symptoms started. Denies fever, chills, abdominal pain, flank pain. She reports she also gets a yeast infection after taking antibiotics.  She is perimenopausal and does have some vaginal dryness.  She has been using over-the-counter lubricants.  She is also asking if there is anything she can take for nerve pain which she believes is due to diabetes.  She has tried gabapentin 300 mg 3 times daily in the past and reports that it was not effective.  PERTINENT  PMH / PSH: T2DM, CKD 3B, bipolar disorder  Patient Care Team: [name], DO as PCP - General (Family Medicine) Janina Mayo, MD as PCP - Cardiology (Cardiology)   OBJECTIVE:   BP 132/79   Pulse 98   Wt (!) 383 lb (173.7 kg)   LMP 12/03/2021   SpO2 99%   BMI 53.42 kg/m   Physical Exam Constitutional:      General: She is not in acute distress.    Appearance: She is obese.  HENT:     Head: Normocephalic and atraumatic.  Cardiovascular:     Rate and Rhythm: Normal rate and regular rhythm.  Pulmonary:     Effort: Pulmonary effort is normal. No respiratory distress.     Breath sounds: Normal breath sounds.  Abdominal:     Palpations: Abdomen is soft.     Tenderness: There is no abdominal tenderness. There is no right CVA tenderness or left CVA tenderness.  Musculoskeletal:     Cervical back: Neck supple.  Neurological:     Mental Status: She is alert.         01/13/2022    2:21 PM  Depression screen PHQ 2/9  Decreased Interest 1  Down, Depressed, Hopeless 1  PHQ - 2 Score 2  Altered sleeping 1  Tired, decreased energy 1  Change in appetite 1  Feeling bad or failure about yourself  1  Trouble concentrating 1  Moving slowly or fidgety/restless 0  Suicidal  thoughts 0  PHQ-9 Score 7     Wt Readings from Last 3 Encounters:  01/13/22 (!) 383 lb (173.7 kg)  12/24/21 (!) 386 lb 6.4 oz (175.3 kg)  10/01/21 (!) 390 lb (176.9 kg)     Last hemoglobin A1c Lab Results  Component Value Date   HGBA1C 9.7 (A) 12/24/2021      ASSESSMENT/PLAN:   Dysuria Suspect simple cystitis based on symptoms despite unremarkable UA.  Doubt pyelonephritis, back pain is likely unrelated to UTI. - treat with cephalexin - Azo for symptom relief - send urine culture   Diabetic neuropathy Gabapentin previously not effective though unclear if she ever tried higher dose - recommended OTC capsaicin cream - could consider re-trial of gabapentin at higher dose or try pregabalin  Return if symptoms worsen or fail to improve.   Zola Button, MD [place]

## 2022-01-13 NOTE — Patient Instructions (Addendum)
It was nice seeing you today!  Try capsaicin cream for nerve pain.  Take antibiotics as prescribed. You can also take Azo three times as needed for 2 days. I have sent in yeast medication for you.  Stay well, Zola Button, MD [place] [phone]  --  Make sure to check out at the front desk before you leave today.  Please arrive at least 15 minutes prior to your scheduled appointments.  If you had blood work today, I will send you a MyChart message or a letter if results are normal. Otherwise, I will give you a call.  If you had a referral placed, they will call you to set up an appointment. Please give [name] a call if you don't hear back in the next 2 weeks.  If you need additional refills before your next appointment, please call your pharmacy first.

## 2022-01-15 LAB — URINE CULTURE

## 2022-01-23 ENCOUNTER — Other Ambulatory Visit: Payer: Self-pay

## 2022-01-23 DIAGNOSIS — F411 Generalized anxiety disorder: Secondary | ICD-10-CM

## 2022-01-24 MED ORDER — LORAZEPAM 1 MG PO TABS: 1.0000 mg | ORAL_TABLET | Freq: Three times a day (TID) | ORAL | 0 refills | Status: DC | PRN

## 2022-01-31 ENCOUNTER — Other Ambulatory Visit: Payer: Self-pay | Admitting: Family Medicine

## 2022-02-20 ENCOUNTER — Inpatient Hospital Stay (HOSPITAL_COMMUNITY)
Admission: EM | Admit: 2022-02-20 | Discharge: 2022-02-24 | DRG: 918 | Disposition: A | Discharge status: Other Acute Inpatient Hospital | Payer: BC Managed Care – PPO | Attending: Family Medicine | Admitting: Family Medicine

## 2022-02-20 ENCOUNTER — Other Ambulatory Visit: Payer: Self-pay

## 2022-02-20 ENCOUNTER — Inpatient Hospital Stay (HOSPITAL_COMMUNITY): Payer: BC Managed Care – PPO

## 2022-02-20 DIAGNOSIS — F4321 Adjustment disorder with depressed mood: Secondary | ICD-10-CM

## 2022-02-20 DIAGNOSIS — T39312A Poisoning by propionic acid derivatives, intentional self-harm, initial encounter: Secondary | ICD-10-CM | POA: Diagnosis present

## 2022-02-20 DIAGNOSIS — E785 Hyperlipidemia, unspecified: Secondary | ICD-10-CM | POA: Diagnosis present

## 2022-02-20 DIAGNOSIS — F410 Panic disorder [episodic paroxysmal anxiety] without agoraphobia: Secondary | ICD-10-CM | POA: Diagnosis present

## 2022-02-20 DIAGNOSIS — E11649 Type 2 diabetes mellitus with hypoglycemia without coma: Secondary | ICD-10-CM | POA: Diagnosis not present

## 2022-02-20 DIAGNOSIS — Z6841 Body Mass Index (BMI) 40.0 and over, adult: Secondary | ICD-10-CM

## 2022-02-20 DIAGNOSIS — Z79890 Hormone replacement therapy (postmenopausal): Secondary | ICD-10-CM

## 2022-02-20 DIAGNOSIS — J45909 Unspecified asthma, uncomplicated: Secondary | ICD-10-CM | POA: Diagnosis not present

## 2022-02-20 DIAGNOSIS — E118 Type 2 diabetes mellitus with unspecified complications: Secondary | ICD-10-CM | POA: Diagnosis not present

## 2022-02-20 DIAGNOSIS — I130 Hypertensive heart and chronic kidney disease with heart failure and stage 1 through stage 4 chronic kidney disease, or unspecified chronic kidney disease: Secondary | ICD-10-CM | POA: Diagnosis present

## 2022-02-20 DIAGNOSIS — K760 Fatty (change of) liver, not elsewhere classified: Secondary | ICD-10-CM | POA: Diagnosis present

## 2022-02-20 DIAGNOSIS — N179 Acute kidney failure, unspecified: Secondary | ICD-10-CM

## 2022-02-20 DIAGNOSIS — F322 Major depressive disorder, single episode, severe without psychotic features: Secondary | ICD-10-CM | POA: Diagnosis not present

## 2022-02-20 DIAGNOSIS — R1031 Right lower quadrant pain: Secondary | ICD-10-CM | POA: Diagnosis not present

## 2022-02-20 DIAGNOSIS — E039 Hypothyroidism, unspecified: Secondary | ICD-10-CM | POA: Diagnosis present

## 2022-02-20 DIAGNOSIS — Z9851 Tubal ligation status: Secondary | ICD-10-CM

## 2022-02-20 DIAGNOSIS — T39392A Poisoning by other nonsteroidal anti-inflammatory drugs [NSAID], intentional self-harm, initial encounter: Secondary | ICD-10-CM | POA: Diagnosis present

## 2022-02-20 DIAGNOSIS — E1165 Type 2 diabetes mellitus with hyperglycemia: Secondary | ICD-10-CM | POA: Diagnosis not present

## 2022-02-20 DIAGNOSIS — Z1152 Encounter for screening for COVID-19: Secondary | ICD-10-CM

## 2022-02-20 DIAGNOSIS — Z794 Long term (current) use of insulin: Secondary | ICD-10-CM | POA: Diagnosis not present

## 2022-02-20 DIAGNOSIS — T1491XA Suicide attempt, initial encounter: Secondary | ICD-10-CM

## 2022-02-20 DIAGNOSIS — E162 Hypoglycemia, unspecified: Secondary | ICD-10-CM | POA: Diagnosis not present

## 2022-02-20 DIAGNOSIS — I5022 Chronic systolic (congestive) heart failure: Secondary | ICD-10-CM | POA: Diagnosis present

## 2022-02-20 DIAGNOSIS — N1832 Chronic kidney disease, stage 3b: Secondary | ICD-10-CM | POA: Diagnosis present

## 2022-02-20 DIAGNOSIS — F32A Depression, unspecified: Secondary | ICD-10-CM | POA: Diagnosis not present

## 2022-02-20 DIAGNOSIS — F329 Major depressive disorder, single episode, unspecified: Secondary | ICD-10-CM | POA: Diagnosis present

## 2022-02-20 DIAGNOSIS — Z91040 Latex allergy status: Secondary | ICD-10-CM

## 2022-02-20 DIAGNOSIS — T50901A Poisoning by unspecified drugs, medicaments and biological substances, accidental (unintentional), initial encounter: Secondary | ICD-10-CM | POA: Diagnosis present

## 2022-02-20 DIAGNOSIS — T383X2A Poisoning by insulin and oral hypoglycemic [antidiabetic] drugs, intentional self-harm, initial encounter: Secondary | ICD-10-CM | POA: Diagnosis not present

## 2022-02-20 DIAGNOSIS — F332 Major depressive disorder, recurrent severe without psychotic features: Secondary | ICD-10-CM | POA: Diagnosis not present

## 2022-02-20 DIAGNOSIS — E1122 Type 2 diabetes mellitus with diabetic chronic kidney disease: Secondary | ICD-10-CM | POA: Diagnosis not present

## 2022-02-20 DIAGNOSIS — E875 Hyperkalemia: Secondary | ICD-10-CM

## 2022-02-20 DIAGNOSIS — F4381 Prolonged grief disorder: Secondary | ICD-10-CM | POA: Diagnosis present

## 2022-02-20 DIAGNOSIS — R45851 Suicidal ideations: Secondary | ICD-10-CM

## 2022-02-20 DIAGNOSIS — Z825 Family history of asthma and other chronic lower respiratory diseases: Secondary | ICD-10-CM

## 2022-02-20 DIAGNOSIS — Z634 Disappearance and death of family member: Secondary | ICD-10-CM | POA: Diagnosis not present

## 2022-02-20 DIAGNOSIS — Z888 Allergy status to other drugs, medicaments and biological substances status: Secondary | ICD-10-CM

## 2022-02-20 DIAGNOSIS — Z9151 Personal history of suicidal behavior: Secondary | ICD-10-CM

## 2022-02-20 DIAGNOSIS — Z820 Family history of epilepsy and other diseases of the nervous system: Secondary | ICD-10-CM

## 2022-02-20 DIAGNOSIS — R109 Unspecified abdominal pain: Secondary | ICD-10-CM | POA: Diagnosis present

## 2022-02-20 DIAGNOSIS — F411 Generalized anxiety disorder: Secondary | ICD-10-CM | POA: Diagnosis present

## 2022-02-20 DIAGNOSIS — Z886 Allergy status to analgesic agent status: Secondary | ICD-10-CM

## 2022-02-20 DIAGNOSIS — Z7984 Long term (current) use of oral hypoglycemic drugs: Secondary | ICD-10-CM

## 2022-02-20 DIAGNOSIS — Z9049 Acquired absence of other specified parts of digestive tract: Secondary | ICD-10-CM

## 2022-02-20 DIAGNOSIS — N189 Chronic kidney disease, unspecified: Secondary | ICD-10-CM

## 2022-02-20 DIAGNOSIS — Z79899 Other long term (current) drug therapy: Secondary | ICD-10-CM

## 2022-02-20 DIAGNOSIS — E119 Type 2 diabetes mellitus without complications: Secondary | ICD-10-CM | POA: Diagnosis present

## 2022-02-20 DIAGNOSIS — T50902A Poisoning by unspecified drugs, medicaments and biological substances, intentional self-harm, initial encounter: Secondary | ICD-10-CM | POA: Diagnosis not present

## 2022-02-20 DIAGNOSIS — I10 Essential (primary) hypertension: Secondary | ICD-10-CM | POA: Diagnosis present

## 2022-02-20 DIAGNOSIS — R339 Retention of urine, unspecified: Secondary | ICD-10-CM | POA: Diagnosis present

## 2022-02-20 LAB — CBC WITH DIFFERENTIAL/PLATELET
Abs Immature Granulocytes: 0.03 10*3/uL (ref 0.00–0.07)
Basophils Absolute: 0 10*3/uL (ref 0.0–0.1)
Basophils Relative: 0 %
Eosinophils Absolute: 0 10*3/uL (ref 0.0–0.5)
Eosinophils Relative: 0 %
HCT: 36.7 % (ref 36.0–46.0)
Hemoglobin: 11.7 g/dL — ABNORMAL LOW (ref 12.0–15.0)
Immature Granulocytes: 0 %
Lymphocytes Relative: 7 %
Lymphs Abs: 0.8 10*3/uL (ref 0.7–4.0)
MCH: 28.1 pg (ref 26.0–34.0)
MCHC: 31.9 g/dL (ref 30.0–36.0)
MCV: 88.2 fL (ref 80.0–100.0)
Monocytes Absolute: 0.6 10*3/uL (ref 0.1–1.0)
Monocytes Relative: 5 %
Neutro Abs: 9.8 10*3/uL — ABNORMAL HIGH (ref 1.7–7.7)
Neutrophils Relative %: 88 %
Platelets: 399 10*3/uL (ref 150–400)
RBC: 4.16 MIL/uL (ref 3.87–5.11)
RDW: 12.8 % (ref 11.5–15.5)
WBC: 11.2 10*3/uL — ABNORMAL HIGH (ref 4.0–10.5)
nRBC: 0 % (ref 0.0–0.2)

## 2022-02-20 LAB — MAGNESIUM: Magnesium: 1.4 mg/dL — ABNORMAL LOW (ref 1.7–2.4)

## 2022-02-20 LAB — CBG MONITORING, ED
Glucose-Capillary: 108 mg/dL — ABNORMAL HIGH (ref 70–99)
Glucose-Capillary: 110 mg/dL — ABNORMAL HIGH (ref 70–99)
Glucose-Capillary: 112 mg/dL — ABNORMAL HIGH (ref 70–99)
Glucose-Capillary: 125 mg/dL — ABNORMAL HIGH (ref 70–99)
Glucose-Capillary: 147 mg/dL — ABNORMAL HIGH (ref 70–99)
Glucose-Capillary: 32 mg/dL — CL (ref 70–99)
Glucose-Capillary: 38 mg/dL — CL (ref 70–99)
Glucose-Capillary: 58 mg/dL — ABNORMAL LOW (ref 70–99)
Glucose-Capillary: 64 mg/dL — ABNORMAL LOW (ref 70–99)
Glucose-Capillary: 67 mg/dL — ABNORMAL LOW (ref 70–99)
Glucose-Capillary: 70 mg/dL (ref 70–99)
Glucose-Capillary: 71 mg/dL (ref 70–99)
Glucose-Capillary: 72 mg/dL (ref 70–99)
Glucose-Capillary: 80 mg/dL (ref 70–99)
Glucose-Capillary: 88 mg/dL (ref 70–99)
Glucose-Capillary: 88 mg/dL (ref 70–99)
Glucose-Capillary: 91 mg/dL (ref 70–99)
Glucose-Capillary: 95 mg/dL (ref 70–99)

## 2022-02-20 LAB — LIPASE, BLOOD: Lipase: 39 U/L (ref 11–51)

## 2022-02-20 LAB — I-STAT BETA HCG BLOOD, ED (MC, WL, AP ONLY): I-stat hCG, quantitative: <5 m[IU]/mL (ref <5)

## 2022-02-20 LAB — SALICYLATE LEVEL: Salicylate Lvl: <7 mg/dL — ABNORMAL LOW (ref 7.0–30.0)

## 2022-02-20 LAB — TYPE AND SCREEN
ABO/RH(D): A POS
Antibody Screen: NEGATIVE

## 2022-02-20 LAB — PROTIME-INR
INR: 1.1 (ref 0.8–1.2)
Prothrombin Time: 14.5 seconds (ref 11.4–15.2)

## 2022-02-20 LAB — ACETAMINOPHEN LEVEL: Acetaminophen (Tylenol), Serum: <10 ug/mL — ABNORMAL LOW (ref 10–30)

## 2022-02-20 LAB — BASIC METABOLIC PANEL
Anion gap: 12 (ref 5–15)
BUN: 27 mg/dL — ABNORMAL HIGH (ref 6–20)
CO2: 21 mmol/L — ABNORMAL LOW (ref 22–32)
Calcium: 8.8 mg/dL — ABNORMAL LOW (ref 8.9–10.3)
Chloride: 111 mmol/L (ref 98–111)
Creatinine, Ser: 1.84 mg/dL — ABNORMAL HIGH (ref 0.44–1.00)
GFR, Estimated: 33 mL/min — ABNORMAL LOW (ref >60)
Glucose, Bld: 44 mg/dL — CL (ref 70–99)
Potassium: 3.7 mmol/L (ref 3.5–5.1)
Sodium: 144 mmol/L (ref 135–145)

## 2022-02-20 LAB — ETHANOL: Alcohol, Ethyl (B): <10 mg/dL (ref <10)

## 2022-02-20 LAB — HEPATIC FUNCTION PANEL
ALT: 14 U/L (ref 0–44)
AST: 23 U/L (ref 15–41)
Albumin: 3.1 g/dL — ABNORMAL LOW (ref 3.5–5.0)
Alkaline Phosphatase: 42 U/L (ref 38–126)
Bilirubin, Direct: <0.1 mg/dL (ref 0.0–0.2)
Total Bilirubin: 0.6 mg/dL (ref 0.3–1.2)
Total Protein: 7.2 g/dL (ref 6.5–8.1)

## 2022-02-20 LAB — LACTIC ACID, PLASMA: Lactic Acid, Venous: 1.7 mmol/L (ref 0.5–1.9)

## 2022-02-20 LAB — PHOSPHORUS: Phosphorus: 1.3 mg/dL — ABNORMAL LOW (ref 2.5–4.6)

## 2022-02-20 LAB — APTT: aPTT: 29 seconds (ref 24–36)

## 2022-02-20 LAB — TSH: TSH: 1.015 u[IU]/mL (ref 0.350–4.500)

## 2022-02-20 MED ORDER — LEVOTHYROXINE SODIUM 100 MCG PO TABS
300.0000 ug | ORAL_TABLET | Freq: Every day | ORAL | Status: DC
  Administered 2022-02-21 – 2022-02-24 (×4): 300 ug via ORAL
  Filled 2022-02-20 (×4): qty 3

## 2022-02-20 MED ORDER — DEXTROSE 50 % IV SOLN
1.0000 | INTRAVENOUS | Status: DC | PRN
  Administered 2022-02-20 – 2022-02-21 (×6): 50 mL via INTRAVENOUS
  Filled 2022-02-20 (×6): qty 50

## 2022-02-20 MED ORDER — MORPHINE SULFATE (PF) 4 MG/ML IV SOLN
4.0000 mg | Freq: Once | INTRAVENOUS | Status: AC
  Administered 2022-02-20: 4 mg via INTRAVENOUS
  Filled 2022-02-20: qty 1

## 2022-02-20 MED ORDER — ONDANSETRON HCL 4 MG/2ML IJ SOLN: 4.0000 mg | Freq: Four times a day (QID) | INTRAMUSCULAR | Status: DC | PRN

## 2022-02-20 MED ORDER — DEXTROSE 50 % IV SOLN
1.0000 | Freq: Once | INTRAVENOUS | Status: AC
  Administered 2022-02-20: 50 mL via INTRAVENOUS
  Filled 2022-02-20: qty 50

## 2022-02-20 MED ORDER — FLUTICASONE FUROATE-VILANTEROL 200-25 MCG/ACT IN AEPB
1.0000 | INHALATION_SPRAY | Freq: Every day | RESPIRATORY_TRACT | Status: DC
  Administered 2022-02-21 – 2022-02-24 (×3): 1 via RESPIRATORY_TRACT
  Filled 2022-02-20: qty 28

## 2022-02-20 MED ORDER — DEXTROSE 50 % IV SOLN
INTRAVENOUS | Status: AC
  Filled 2022-02-20: qty 50

## 2022-02-20 MED ORDER — LORAZEPAM 2 MG/ML IJ SOLN
1.0000 mg | Freq: Four times a day (QID) | INTRAMUSCULAR | Status: DC | PRN
  Administered 2022-02-21: 1 mg via INTRAVENOUS
  Filled 2022-02-20: qty 1

## 2022-02-20 MED ORDER — DOCUSATE SODIUM 100 MG PO CAPS: 100.0000 mg | ORAL_CAPSULE | Freq: Two times a day (BID) | ORAL | Status: DC | PRN

## 2022-02-20 MED ORDER — GLUCAGON HCL RDNA (DIAGNOSTIC) 1 MG IJ SOLR
1.0000 mg | Freq: Once | INTRAMUSCULAR | Status: DC
  Filled 2022-02-20: qty 1

## 2022-02-20 MED ORDER — SODIUM CHLORIDE 0.9% FLUSH
3.0000 mL | Freq: Two times a day (BID) | INTRAVENOUS | Status: DC
  Administered 2022-02-21 – 2022-02-23 (×4): 3 mL via INTRAVENOUS

## 2022-02-20 MED ORDER — DEXTROSE 10 % IV SOLN
100.0000 mL | Freq: Once | INTRAVENOUS | Status: AC
  Administered 2022-02-20: 100 mL via INTRAVENOUS

## 2022-02-20 MED ORDER — PANTOPRAZOLE SODIUM 40 MG IV SOLR
40.0000 mg | Freq: Two times a day (BID) | INTRAVENOUS | Status: DC
  Filled 2022-02-20: qty 10

## 2022-02-20 MED ORDER — SODIUM PHOSPHATES 45 MMOLE/15ML IV SOLN
15.0000 mmol | Freq: Once | INTRAVENOUS | Status: AC
  Administered 2022-02-21: 15 mmol via INTRAVENOUS
  Filled 2022-02-20: qty 5

## 2022-02-20 MED ORDER — MAGNESIUM SULFATE 2 GM/50ML IV SOLN
2.0000 g | Freq: Once | INTRAVENOUS | Status: AC
  Administered 2022-02-21: 2 g via INTRAVENOUS
  Filled 2022-02-20: qty 50

## 2022-02-20 MED ORDER — ONDANSETRON HCL 4 MG/2ML IJ SOLN
4.0000 mg | Freq: Once | INTRAMUSCULAR | Status: AC
  Administered 2022-02-20: 4 mg via INTRAVENOUS
  Filled 2022-02-20: qty 2

## 2022-02-20 MED ORDER — PANTOPRAZOLE SODIUM 40 MG IV SOLR
40.0000 mg | INTRAVENOUS | Status: DC
  Administered 2022-02-20: 40 mg via INTRAVENOUS

## 2022-02-20 MED ORDER — POLYETHYLENE GLYCOL 3350 17 G PO PACK: 17.0000 g | PACK | Freq: Every day | ORAL | Status: DC | PRN

## 2022-02-20 NOTE — ED Provider Notes (Signed)
[facility] EMERGENCY DEPARTMENT Provider Note   CSN: [id number] Arrival date & time: 02/20/22  1620     History  Chief Complaint  Patient presents with   Suicidal   Patient [name] is a 51 y.o. female with a past medical history of renal failure not currently on dialysis, anxiety, asthma, depression, diabetes mellitus, hypertension, rhabdomyolysis presenting to the emergency room for evaluation after a suicide attempt.  Per EMS patient took 1800 units of NovoLog and 6 g of naproxen around 1 PM today.  Patient states she has been feeling depressed and holiday has been difficult for her because her son passed away last year.  Patient states that right after the attempt she started to have chills and tremors.  She does report right lower quadrant abdominal pain.  Denies headache, dizziness, vision changes, vomiting, chest pain, shortness of breath, bowel changes, urinary symptoms, rash, fever.  HPI    Past Medical History:  Diagnosis Date   Acute renal failure ([place]) 05/27/10   hemodialysis for 6 weeks   Anxiety    Asthma    Depression    Diabetes mellitus    Hypertension    Rhabdomyolysis 06/06/10   after drug overdose   Suicide attempt by drug ingestion ([place]) 06/05/10   result rhabdomyolosis and ARF requrining dialysis    Past Surgical History:  Procedure Laterality Date   ANKLE ARTHROSCOPY Right 08/17/2015   Procedure: RIGHT ANKLE ARTHROSCOPY WITH SYNOVECTOMY AND LOOSE BODY EXCISION;  Surgeon: Melrose Nakayama, MD;  Location: [facility];  Service: Orthopedics;  Laterality: Right;   [place] Medications Prior to Admission medications   Medication Sig Start Date End Date Taking? Authorizing Provider  Semaglutide, 1 MG/DOSE, 4 MG/3ML SOPN Inject 1 mg into the skin once a week. 01/09/22   [name], DO  albuterol (PROVENTIL) (2.5 MG/3ML) 0.083% nebulizer solution Take 3 mLs by nebulization 3 (three) times  daily as needed. 05/21/21   Dameron, Luna Fuse, DO  albuterol (VENTOLIN HFA) 108 (90 Base) MCG/ACT inhaler Inhale 2 puffs into the lungs every 4 (four) hours as needed for wheezing or shortness of breath. 05/21/21   Dameron, Luna Fuse, DO  blood glucose meter kit and supplies KIT Dispense based on patient and insurance preference. Use up to four times daily as directed. 10/01/21   [name], DO  famotidine (PEPCID) 20 MG tablet Take 20 mg by mouth 2 (two) times daily.    [provider]  FLUoxetine (PROZAC) 40 MG capsule Take 2 capsules (80 mg total) by mouth daily. 11/12/21   Salley Slaughter, NP  fluticasone (FLONASE) 50 MCG/ACT nasal spray Place 1 spray into both nostrils daily. 1 spray in each nostril every day 06/10/21   [name], DO  fluticasone furoate-vilanterol (BREO ELLIPTA) 200-25 MCG/ACT AEPB Inhale 1 puff into the lungs daily. 04/15/21   [name], DO  furosemide (LASIX) 40 MG tablet Take 1 tablet (40 mg total) by mouth 2 (two) times daily. 12/24/21   [name], DO  hydrOXYzine (VISTARIL) 50 MG capsule Take 1 capsule (50 mg total) by mouth 3 (three) times daily as needed for itching. 11/12/21   Salley Slaughter, NP  insulin aspart (NOVOLOG) 100 UNIT/ML FlexPen Inject 10 units with Lunch and 15 units with Dinner Patient taking differently: 15-20 Units in the morning and at bedtime. Inject 15 units with Lunch and 20 units with Dinner 12/20/20   [name],  DO  Insulin Glargine (BASAGLAR KWIKPEN) 100 UNIT/ML Inject 65 Units into the skin 2 (two) times daily. Take 65U twice a day 04/22/21   [name], DO  isosorbide-hydrALAZINE (BIDIL) 20-37.5 MG tablet Take 1 tablet by mouth 3 (three) times daily. 12/24/21   [name], DO  levothyroxine (SYNTHROID) 300 MCG tablet Take 1 tablet (300 mcg total) by mouth daily before breakfast. 12/30/21   [name], DO  LORazepam (ATIVAN) 1 MG tablet Take 1 tablet (1 mg total) by mouth  every 8 (eight) hours as needed. for anxiety 01/24/22   [name], DO  metFORMIN (GLUCOPHAGE) 1000 MG tablet TAKE 1 TABLET BY MOUTH TWICE DAILY WITH A MEAL 02/03/22   [name], DO  metoprolol tartrate (LOPRESSOR) 25 MG tablet Take 1 tablet (25 mg total) by mouth 2 (two) times daily. 12/24/21   [name], DO  mirtazapine (REMERON) 30 MG tablet Take 1 tablet (30 mg total) by mouth daily. 11/12/21   Salley Slaughter, NP  phenazopyridine (PYRIDIUM) 95 MG tablet Take 1 tablet (95 mg total) by mouth 3 (three) times daily as needed for pain. 01/13/22   Zola Button, MD  rosuvastatin (CRESTOR) 20 MG tablet Take 1 tablet (20 mg total) by mouth daily. 10/01/21   [name], DO  terbinafine (LAMISIL) 250 MG tablet Take 1 tablet (250 mg total) by mouth daily. 10/03/21   [name], DO  Vitamin D, Ergocalciferol, (DRISDOL) 50000 units CAPS capsule Take 1 capsule (50,000 Units total) by mouth every 7 (seven) days. Sunday Patient not taking: Reported on 12/24/2021 12/04/16   Verner Mould, MD  ipratropium-albuterol (DUONEB) 0.5-2.5 (3) MG/3ML SOLN Take 3 mLs by nebulization 3 (three) times daily as needed. Patient taking differently: Take 3 mLs by nebulization 3 (three) times daily as needed (sob/wheezing). 10/12/20 05/21/21  Domenic Moras, PA-C      Allergies    Ace inhibitors, Haldol [haloperidol lactate], Nsaids, and Latex    Review of Systems   Review of Systems Negative except as per HPI.  Physical Exam Updated Vital Signs BP 134/73   Pulse 100   Resp 12   Ht _0  (1.803 m)   Wt (!) 167.8 kg   SpO2 98%   BMI 51.60 kg/m  Physical Exam Vitals and nursing note reviewed.  Constitutional:      Appearance: Normal appearance.  HENT:     Head: Normocephalic and atraumatic.     Mouth/Throat:     Mouth: Mucous membranes are moist.  Eyes:     General: No scleral icterus. Cardiovascular:     Rate and Rhythm: Normal rate and regular rhythm.      Pulses: Normal pulses.     Heart sounds: Normal heart sounds.  Pulmonary:     Effort: Pulmonary effort is normal.     Breath sounds: Normal breath sounds.  Abdominal:     General: Abdomen is flat.     Palpations: Abdomen is soft.     Tenderness: There is no abdominal tenderness.  Musculoskeletal:        General: No deformity.     Comments: TTP to right lower quadrant.  Skin:    General: Skin is warm.     Findings: No rash.  Neurological:     General: No focal deficit present.     Mental Status: She is alert.  Psychiatric:        Mood and Affect: Mood normal.     Comments: Patient is alert and tearful.  ED Results / Procedures / Treatments   Labs (all labs ordered are listed, but only abnormal results are displayed) Labs Reviewed  CBG MONITORING, ED - Abnormal; Notable for the following components:      Result Value   Glucose-Capillary 38 (*)    All other components within normal limits  CBG MONITORING, ED - Abnormal; Notable for the following components:   Glucose-Capillary 32 (*)    All other components within normal limits  CBG MONITORING, ED - Abnormal; Notable for the following components:   Glucose-Capillary 58 (*)    All other components within normal limits  CBG MONITORING, ED - Abnormal; Notable for the following components:   Glucose-Capillary 67 (*)    All other components within normal limits  CBG MONITORING, ED - Abnormal; Notable for the following components:   Glucose-Capillary 108 (*)    All other components within normal limits  CBG MONITORING, ED - Abnormal; Notable for the following components:   Glucose-Capillary 64 (*)    All other components within normal limits  CBG MONITORING, ED - Abnormal; Notable for the following components:   Glucose-Capillary 147 (*)    All other components within normal limits  BASIC METABOLIC PANEL  ACETAMINOPHEN LEVEL  ETHANOL  RAPID URINE DRUG SCREEN, HOSP PERFORMED  URINALYSIS, ROUTINE W REFLEX MICROSCOPIC   SALICYLATE LEVEL  HEPATIC FUNCTION PANEL  LIPASE, BLOOD  CBC WITH DIFFERENTIAL/PLATELET  MAGNESIUM  PHOSPHORUS  LACTIC ACID, PLASMA  LACTIC ACID, PLASMA  BASIC METABOLIC PANEL  MAGNESIUM  PHOSPHORUS  PROTIME-INR  APTT  TSH  CBG MONITORING, ED  I-STAT BETA HCG BLOOD, ED ([facility], [facility], [facility] ONLY)  CBG MONITORING, ED  CBG MONITORING, ED  TYPE AND SCREEN    EKG None  Radiology No results found.  Procedures .Critical Care  Performed by: Rex Kras, [place] Authorized by: Rex Kras, PA   Critical care provider statement:    Critical care time (minutes):  45   Critical care was necessary to treat or prevent imminent or life-threatening deterioration of the following conditions: overdose, hypoglycemia.   Critical care was time spent personally by me on the following activities:  Development of treatment plan with patient or surrogate, discussions with consultants, evaluation of patient's response to treatment, examination of patient, ordering and review of laboratory studies, ordering and review of radiographic studies, ordering and performing treatments and interventions, pulse oximetry, re-evaluation of patient's condition and review of old charts     Medications Ordered in ED Medications  sodium chloride flush (NS) 0.9 % injection 3 mL (has no administration in time range)  morphine (PF) 4 MG/ML injection 4 mg (0 mg Intravenous Hold 02/20/22 1822)  docusate sodium (COLACE) capsule 100 mg (has no administration in time range)  polyethylene glycol (MIRALAX / GLYCOLAX) packet 17 g (has no administration in time range)  ondansetron (ZOFRAN) injection 4 mg (has no administration in time range)  dextrose 50 % solution 50 mL (50 mLs Intravenous Given 02/20/22 2015)  glucagon (human recombinant) (GLUCAGEN) injection 1 mg (has no administration in time range)  dextrose 50 % solution (has no administration in time range)  pantoprazole (PROTONIX) injection 40 mg (has no administration in time range)   LORazepam (ATIVAN) injection 1 mg (has no administration in time range)  levothyroxine (SYNTHROID) tablet 300 mcg (has no administration in time range)  fluticasone furoate-vilanterol (BREO ELLIPTA) 200-25 MCG/ACT 1 puff (has no administration in time range)  dextrose 50 % solution 50 mL (50 mLs Intravenous Given 02/20/22 1812)  dextrose 10 % infusion (100 mLs Intravenous New Bag/Given 02/20/22 1816)  ondansetron (ZOFRAN) injection 4 mg (4 mg Intravenous Given 02/20/22 1821)  dextrose 50 % solution 50 mL (50 mLs Intravenous Given 02/20/22 1914)    ED Course/ Medical Decision Making/ A&P Clinical Course as of 02/20/22 2029  Thu Feb 20, 2022  1913 51 yo female here with intentional suicidal overdose of insulin at 11 am per her report took 1800 units of novalog and 6000 mg naproxen.  Pt was unresponsive initially but awake on my evaluation - repeat poc continues dropping despite d50 amps and now on d10.  She is drinking juice and I gave her some carbs to eat as well.  We will consult critical care given her persistent hypoglycemia.  Poison control contacted regarding overdose - see PA provider note.  Pt complaining of abdominal pain but no sign of acute anemia/GI bleed clinically.   [MT]    Clinical Course User Index [MT] Trifan, Carola Rhine, MD                           Medical Decision Making Amount and/or Complexity of Data Reviewed Labs: ordered.  Risk Prescription drug management. Decision regarding hospitalization.   This patient presents to the ED for suicidal ideation, this involves an extensive number of treatment options, and is a complaint that carries with a high risk of complications and morbidity.  The differential diagnosis includes drug overdose, hypoglycemic crisis, suicidal ideation.  This is not an exhaustive list.  Comorbidities that complicate the patient evaluation See HPI  Social determinants of health NA  Additional history obtained: External records from  outside source obtained and reviewed including: Chart review including previous notes, labs, imaging.  Cardiac monitoring/EKG: The patient was maintained on a cardiac monitor.  I personally reviewed and interpreted the cardiac monitor which showed an underlying rhythm of: Sinus rhythm.  Lab tests: I ordered and personally interpreted labs.  The pertinent results include: Labs pending.  CBG fluctuates between 58 and 108.  Imaging studies: I ordered imaging studies. I personally reviewed, interpreted imaging and agree with the radiologist's interpretations. Findings include: Ultrasound right upper quadrant pending.  Problem list/ ED course/ Critical interventions/ Medical management: HPI: See above Vital signs within normal range and stable throughout visit. Laboratory/imaging studies significant for: See above. On physical examination, patient is afebrile and appears in no acute distress.  There was tenderness evaluation to right lower quadrant.  Patient reportedly took 1800 unit of NovoLog and 6 g naproxen given a suicidal attempt.  CBG with EMS was 58.  His initial CBG on ED arrival was 38. 2 x D50 amp given.  Patient put on D10 infusion.  Patient was supplemented with orange juice and fruit however her CBGs are still very low.  Poison control were called.  They recommended continuously monitoring CBG, given 50, D10, give patient something to eat and drink. I have reviewed the patient home medicines and have made adjustments as needed.  Consultations obtained: I requested consultation with critical care team Dr. Erin Fulling, and discussed lab and imaging findings as well as pertinent plan.  He recommended: admission.  Disposition Patient is admitted to the hospital for further evaluation and management.  This chart was dictated using voice recognition software.  Despite best efforts to proofread,  errors can occur which can change the documentation meaning.          Final Clinical  Impression(s) / ED Diagnoses Final  diagnoses:  Hypoglycemia  Suicidal ideation    Rx / DC Orders ED Discharge Orders     None         Rex Kras, [place] 02/20/22 2355    Blanchie Dessert, MD 02/21/22 1535

## 2022-02-20 NOTE — ED Notes (Signed)
Pt transported to ultrasound.

## 2022-02-20 NOTE — H&P (Signed)
NAME:  [name], MRN:  [id number], DOB:  09/14/1970, LOS: 0 ADMISSION DATE:  02/20/2022, CONSULTATION DATE:  02/20/22 REFERRING MD:  ER CHIEF COMPLAINT:  overdose   History of Present Illness:  [name] is a a 51 year old woman, never smoker with history of DMII, hypertension, anxiety/depression and prior suicide attempt in 2012 who presented to [facility] ER after a suicide attempt. She took 1800 units of Novolog and 6,091m of naproxen around 1pm today. She denies any other ingestions.   In the ER she was noted to be hypoglycemic at 38. She was started on D10 infusion and has required multiple D50 pushes due to on going drops in her hemoglobin. She is alert and mentating. Labs are pending.   PCCM consulted for admission due to on going hypoglycemia.   She complains of right sided abdominal pain that is sharp. This pain started since she has been in the ER. She does have nausea which has improved with IV zofran. She denies any vomiting or diarrhea. Denies bloody stools. No history of GI bleeds.    I spoke briefly with patient's husband via phone and updated him of her admission to the hospital.  Pertinent  Medical History   Past Medical History:  Diagnosis Date   [place] 05[place]   hemodialysis for 6 weeks   Anxiety    Asthma    Depression    Diabetes mellitus    Hypertension    Rhabdomyolysis 06/06/10   after drug overdose   [place]   result rhabdomyolosis and ARF requrining dialysis    Significant Hospital Events: Including procedures, antibiotic start and stop dates in addition to other pertinent events   12/14 admitted, hypoglycemia in setting of insulin OD  Interim History / Subjective:  As above  Objective   Blood pressure 134/73, pulse 100, resp. rate 12, height 5' 11" (1.803 m), weight (!) 167.8 kg, SpO2 98 %.       No intake or output data in the 24 hours ending 02/20/22 1946 Filed Weights   02/20/22  1628  Weight: (!) 167.8 kg    Examination: General: obese, mild distress - tearful, laying in bed HENT: [place]/AT, moist mucous membranes, sclera anicteric Lungs: clear to auscultation, no wheezing Cardiovascular: regular rate and rhythm, no murmurs Abdomen: soft, non-distended, tender to palpation in the RUQ and epigastric regions Extremities: warm, no edema Neuro: alert, oriented, moving all extremities GU: n/a  Resolved Hospital Problem list     Assessment & Plan:  Severe Hypoglycemia DMII - in setting of insulin overdose - continue D10 infusion - Give 18mglucagon now - Will add steroids if needed - PRN D50 pushes q15 minutes - monitor frequent blood sugars  Suicide Attempt Hx of anxiety/depression - bedside sitter - will consult psych for further evaluation - holding home fluoxetine and mirtazepine for now - PRN ativan for anxiety  Drug Overdose - Patient took 1800 units of Novalog and 6,00072mf naproxen - Supportive care at this time - Monitor blood sugars and chemistry panels - placed on PPI daily for GI prophylaxis  Abdominal Pain - Check lactic acid, lipase and LFTs - RUQ US Koreadered - PPI as above  Hx of hypothyroidism - continue home synthroid - check TSH  Hx of asthma - continue home breo inhaler  Best Practice (right click and "Reselect all SmartList Selections" daily)   Diet/type: clear liquids DVT prophylaxis: SCD GI prophylaxis: PPI Lines: N/A  Foley:  N/A Code Status:  full code Last date of multidisciplinary goals of care discussion [12/14 with patient]  Labs   CBC: No results for input(s): "WBC", "NEUTROABS", "HGB", "HCT", "MCV", "PLT" in the last 168 hours.  Basic Metabolic Panel: No results for input(s): "NA", "K", "CL", "CO2", "GLUCOSE", "BUN", "CREATININE", "CALCIUM", "MG", "PHOS" in the last 168 hours. GFR: CrCl cannot be calculated (Patient's most recent lab result is older than the maximum 21 days allowed.). No results for  input(s): "PROCALCITON", "WBC", "LATICACIDVEN" in the last 168 hours.  Liver Function Tests: No results for input(s): "AST", "ALT", "ALKPHOS", "BILITOT", "PROT", "ALBUMIN" in the last 168 hours. No results for input(s): "LIPASE", "AMYLASE" in the last 168 hours. No results for input(s): "AMMONIA" in the last 168 hours.  ABG    Component Value Date/Time   PHART 7.335 (L) 03/14/2017 2245   PCO2ART 36.9 03/14/2017 2245   PO2ART 73.1 (L) 03/14/2017 2245   HCO3 25.7 04/25/2021 1037   TCO2 27 04/25/2021 1037   ACIDBASEDEF 1.0 04/25/2021 1037   O2SAT 94 04/25/2021 1037     Coagulation Profile: No results for input(s): "INR", "PROTIME" in the last 168 hours.  Cardiac Enzymes: No results for input(s): "CKTOTAL", "CKMB", "CKMBINDEX", "TROPONINI" in the last 168 hours.  HbA1C: HbA1c, POC (controlled diabetic range)  Date/Time Value Ref Range Status  12/24/2021 04:49 PM 9.7 (A) 0.0 - 7.0 % Final  10/01/2021 11:18 AM 8.9 (A) 0.0 - 7.0 % Final    CBG: Recent Labs  Lab 02/20/22 1810 02/20/22 1833 02/20/22 1850 02/20/22 1908 02/20/22 1933  GLUCAP 32* 88 58* 67* 108*    Review of Systems:   Review of Systems  Constitutional:  Negative for chills, fever, malaise/fatigue and weight loss.  HENT:  Negative for congestion, sinus pain and sore throat.   Eyes: Negative.   Respiratory:  Negative for cough, hemoptysis, sputum production, shortness of breath and wheezing.   Cardiovascular:  Negative for chest pain, palpitations, orthopnea, claudication and leg swelling.  Gastrointestinal:  Positive for abdominal pain and nausea. Negative for heartburn and vomiting.  Genitourinary: Negative.   Musculoskeletal:  Negative for joint pain and myalgias.  Skin:  Negative for rash.  Neurological:  Positive for weakness (generalized).  Endo/Heme/Allergies: Negative.   Psychiatric/Behavioral: Negative.       Past Medical History:  She,  has a past medical history of Acute renal failure ([place])  (05/27/10), Anxiety, Asthma, Depression, Diabetes mellitus, Hypertension, Rhabdomyolysis (06/06/10), and Suicide attempt by drug ingestion ([place]) (06/05/10).   Surgical History:   Past Surgical History:  Procedure Laterality Date   ANKLE ARTHROSCOPY Right 08/17/2015   Procedure: RIGHT ANKLE ARTHROSCOPY WITH SYNOVECTOMY AND LOOSE BODY EXCISION;  Surgeon: Melrose Nakayama, MD;  Location: [place];  Service: Orthopedics;  Laterality: Right;   CHOLECYSTECTOMY     TUBAL LIGATION  1998     Social History:   reports that she has never smoked. She has never been exposed to tobacco smoke. She has never used smokeless tobacco. She reports that she does not drink alcohol and does not use drugs.   Family History:  Her family history includes Asthma in her father.   Allergies Allergies  Allergen Reactions   Ace Inhibitors Other (See Comments)    Patient had acute renal failure requiring hemodialysis after a suicide attempt.  Nephro recommended avoiding use.   Haldol [Haloperidol Lactate] Other (See Comments)    Tardive dyskinesia   Nsaids Other (See Comments)    Nephro recommended avoiding  after acute renal failure requiring hemodialysis associated with a suicide attempt.   Latex Other (See Comments)    Rash around IV site     Home Medications  Prior to Admission medications   Medication Sig Start Date End Date Taking? Authorizing Provider  Semaglutide, 1 MG/DOSE, 4 MG/3ML SOPN Inject 1 mg into the skin once a week. 01/09/22   [name], DO  albuterol (PROVENTIL) (2.5 MG/3ML) 0.083% nebulizer solution Take 3 mLs by nebulization 3 (three) times daily as needed. 05/21/21   Dameron, Luna Fuse, DO  albuterol (VENTOLIN HFA) 108 (90 Base) MCG/ACT inhaler Inhale 2 puffs into the lungs every 4 (four) hours as needed for wheezing or shortness of breath. 05/21/21   Dameron, Luna Fuse, DO  blood glucose meter kit and supplies KIT Dispense based on patient and insurance preference. Use up to four times daily as  directed. 10/01/21   [name], DO  famotidine (PEPCID) 20 MG tablet Take 20 mg by mouth 2 (two) times daily.    [provider]  FLUoxetine (PROZAC) 40 MG capsule Take 2 capsules (80 mg total) by mouth daily. 11/12/21   Salley Slaughter, NP  fluticasone (FLONASE) 50 MCG/ACT nasal spray Place 1 spray into both nostrils daily. 1 spray in each nostril every day 06/10/21   [name], DO  fluticasone furoate-vilanterol (BREO ELLIPTA) 200-25 MCG/ACT AEPB Inhale 1 puff into the lungs daily. 04/15/21   [name], DO  furosemide (LASIX) 40 MG tablet Take 1 tablet (40 mg total) by mouth 2 (two) times daily. 12/24/21   [name], DO  hydrOXYzine (VISTARIL) 50 MG capsule Take 1 capsule (50 mg total) by mouth 3 (three) times daily as needed for itching. 11/12/21   Salley Slaughter, NP  insulin aspart (NOVOLOG) 100 UNIT/ML FlexPen Inject 10 units with Lunch and 15 units with Dinner Patient taking differently: 15-20 Units in the morning and at bedtime. Inject 15 units with Lunch and 20 units with Dinner 12/20/20   [name], DO  Insulin Glargine (BASAGLAR KWIKPEN) 100 UNIT/ML Inject 65 Units into the skin 2 (two) times daily. Take 65U twice a day 04/22/21   [name], DO  isosorbide-hydrALAZINE (BIDIL) 20-37.5 MG tablet Take 1 tablet by mouth 3 (three) times daily. 12/24/21   [name], DO  levothyroxine (SYNTHROID) 300 MCG tablet Take 1 tablet (300 mcg total) by mouth daily before breakfast. 12/30/21   [name], DO  LORazepam (ATIVAN) 1 MG tablet Take 1 tablet (1 mg total) by mouth every 8 (eight) hours as needed. for anxiety 01/24/22   [name], DO  metFORMIN (GLUCOPHAGE) 1000 MG tablet TAKE 1 TABLET BY MOUTH TWICE DAILY WITH A MEAL 02/03/22   [name], DO  metoprolol tartrate (LOPRESSOR) 25 MG tablet Take 1 tablet (25 mg total) by mouth 2 (two) times daily. 12/24/21   [name], DO  mirtazapine  (REMERON) 30 MG tablet Take 1 tablet (30 mg total) by mouth daily. 11/12/21   Salley Slaughter, NP  phenazopyridine (PYRIDIUM) 95 MG tablet Take 1 tablet (95 mg total) by mouth 3 (three) times daily as needed for pain. 01/13/22   Zola Button, MD  rosuvastatin (CRESTOR) 20 MG tablet Take 1 tablet (20 mg total) by mouth daily. 10/01/21   [name], DO  terbinafine (LAMISIL) 250 MG tablet Take 1 tablet (250 mg total) by mouth daily. 10/03/21   [name], DO  Vitamin D, Ergocalciferol, (DRISDOL) 50000 units CAPS capsule Take 1 capsule (50,000 Units total) by  mouth every 7 (seven) days. Sunday Patient not taking: Reported on 12/24/2021 12/04/16   Verner Mould, MD  ipratropium-albuterol (DUONEB) 0.5-2.5 (3) MG/3ML SOLN Take 3 mLs by nebulization 3 (three) times daily as needed. Patient taking differently: Take 3 mLs by nebulization 3 (three) times daily as needed (sob/wheezing). 10/12/20 05/21/21  Domenic Moras, PA-C     Critical care time: 36 minutes    Freda Jackson, MD [facility] Office: [phone]   See Amion for personal pager PCCM on call pager [phone] until 7pm. Please call Elink 7p-7a. [phone]

## 2022-02-20 NOTE — ED Triage Notes (Addendum)
Pt from home, Pt stated she has been feeling depressed, look a total of 1800 units of novalog and 6000mg  naproxen. Currently feeling shaky, 53CBG  Oreo's and oral glucose   154 BP palpated  HR 100 98 RM air

## 2022-02-20 NOTE — ED Notes (Signed)
Pt had an outburst of yelling due to depressed feelings after a phone call with family, yelling was not towards the staff. Pt is now calm and sitter is at bedside. Will not allow anymore phone calls.

## 2022-02-21 DIAGNOSIS — F32A Depression, unspecified: Secondary | ICD-10-CM

## 2022-02-21 DIAGNOSIS — F322 Major depressive disorder, single episode, severe without psychotic features: Secondary | ICD-10-CM

## 2022-02-21 DIAGNOSIS — R45851 Suicidal ideations: Secondary | ICD-10-CM

## 2022-02-21 DIAGNOSIS — E118 Type 2 diabetes mellitus with unspecified complications: Secondary | ICD-10-CM

## 2022-02-21 DIAGNOSIS — E162 Hypoglycemia, unspecified: Principal | ICD-10-CM

## 2022-02-21 LAB — GLUCOSE, CAPILLARY
Glucose-Capillary: 129 mg/dL — ABNORMAL HIGH (ref 70–99)
Glucose-Capillary: 40 mg/dL — CL (ref 70–99)
Glucose-Capillary: 103 mg/dL — ABNORMAL HIGH (ref 70–99)
Glucose-Capillary: 114 mg/dL — ABNORMAL HIGH (ref 70–99)
Glucose-Capillary: 99 mg/dL (ref 70–99)
Glucose-Capillary: 129 mg/dL — ABNORMAL HIGH (ref 70–99)
Glucose-Capillary: 80 mg/dL (ref 70–99)
Glucose-Capillary: 81 mg/dL (ref 70–99)
Glucose-Capillary: 120 mg/dL — ABNORMAL HIGH (ref 70–99)
Glucose-Capillary: 179 mg/dL — ABNORMAL HIGH (ref 70–99)
Glucose-Capillary: 229 mg/dL — ABNORMAL HIGH (ref 70–99)
Glucose-Capillary: 303 mg/dL — ABNORMAL HIGH (ref 70–99)
Glucose-Capillary: 382 mg/dL — ABNORMAL HIGH (ref 70–99)
Glucose-Capillary: 394 mg/dL — ABNORMAL HIGH (ref 70–99)
Glucose-Capillary: 442 mg/dL — ABNORMAL HIGH (ref 70–99)
Glucose-Capillary: 427 mg/dL — ABNORMAL HIGH (ref 70–99)
Glucose-Capillary: 356 mg/dL — ABNORMAL HIGH (ref 70–99)
Glucose-Capillary: 336 mg/dL — ABNORMAL HIGH (ref 70–99)
Glucose-Capillary: 308 mg/dL — ABNORMAL HIGH (ref 70–99)
Glucose-Capillary: 254 mg/dL — ABNORMAL HIGH (ref 70–99)
Glucose-Capillary: 223 mg/dL — ABNORMAL HIGH (ref 70–99)
Glucose-Capillary: 231 mg/dL — ABNORMAL HIGH (ref 70–99)

## 2022-02-21 LAB — CBG MONITORING, ED
Glucose-Capillary: 107 mg/dL — ABNORMAL HIGH (ref 70–99)
Glucose-Capillary: 66 mg/dL — ABNORMAL LOW (ref 70–99)
Glucose-Capillary: 76 mg/dL (ref 70–99)
Glucose-Capillary: 82 mg/dL (ref 70–99)

## 2022-02-21 LAB — BASIC METABOLIC PANEL
Anion gap: 11 (ref 5–15)
BUN: 24 mg/dL — ABNORMAL HIGH (ref 6–20)
CO2: 24 mmol/L (ref 22–32)
Calcium: 8.8 mg/dL — ABNORMAL LOW (ref 8.9–10.3)
Chloride: 105 mmol/L (ref 98–111)
Creatinine, Ser: 2.01 mg/dL — ABNORMAL HIGH (ref 0.44–1.00)
GFR, Estimated: 29 mL/min — ABNORMAL LOW (ref >60)
Glucose, Bld: 107 mg/dL — ABNORMAL HIGH (ref 70–99)
Potassium: 3.8 mmol/L (ref 3.5–5.1)
Sodium: 140 mmol/L (ref 135–145)

## 2022-02-21 LAB — MAGNESIUM: Magnesium: 1.7 mg/dL (ref 1.7–2.4)

## 2022-02-21 LAB — PHOSPHORUS: Phosphorus: 2.7 mg/dL (ref 2.5–4.6)

## 2022-02-21 LAB — MRSA NEXT GEN BY PCR, NASAL: MRSA by PCR Next Gen: NOT DETECTED

## 2022-02-21 LAB — LACTIC ACID, PLASMA: Lactic Acid, Venous: 1.6 mmol/L (ref 0.5–1.9)

## 2022-02-21 MED ORDER — DEXTROSE 20 % IV SOLN
INTRAVENOUS | Status: DC
  Filled 2022-02-21 (×5): qty 500

## 2022-02-21 MED ORDER — DEXTROSE 10 % IV SOLN: INTRAVENOUS | Status: DC

## 2022-02-21 MED ORDER — METHYLPREDNISOLONE SODIUM SUCC 125 MG IJ SOLR: 125.0000 mg | Freq: Once | INTRAMUSCULAR | Status: DC

## 2022-02-21 MED ORDER — MAGNESIUM SULFATE 2 GM/50ML IV SOLN
2.0000 g | Freq: Once | INTRAVENOUS | Status: AC
  Administered 2022-02-21: 2 g via INTRAVENOUS
  Filled 2022-02-21: qty 50

## 2022-02-21 MED ORDER — PANTOPRAZOLE SODIUM 40 MG PO TBEC
40.0000 mg | DELAYED_RELEASE_TABLET | Freq: Every day | ORAL | Status: DC
  Administered 2022-02-21 – 2022-02-24 (×4): 40 mg via ORAL
  Filled 2022-02-21 (×5): qty 1

## 2022-02-21 MED ORDER — DEXTROSE 50 % IV SOLN: 25.0000 g | INTRAVENOUS | Status: DC

## 2022-02-21 MED ORDER — INSULIN ASPART 100 UNIT/ML IJ SOLN
0.0000 [IU] | Freq: Every day | INTRAMUSCULAR | Status: DC
  Administered 2022-02-21: 2 [IU] via SUBCUTANEOUS

## 2022-02-21 MED ORDER — MIRTAZAPINE 15 MG PO TABS
30.0000 mg | ORAL_TABLET | Freq: Every day | ORAL | Status: DC
  Administered 2022-02-21 – 2022-02-24 (×4): 30 mg via ORAL
  Filled 2022-02-21 (×4): qty 2

## 2022-02-21 MED ORDER — SODIUM CHLORIDE 0.9 % IV SOLN: INTRAVENOUS | Status: DC | PRN

## 2022-02-21 MED ORDER — DEXTROSE 50 % IV SOLN: 1.0000 | INTRAVENOUS | Status: DC | PRN

## 2022-02-21 MED ORDER — K PHOS MONO-SOD PHOS DI & MONO 155-852-130 MG PO TABS
500.0000 mg | ORAL_TABLET | Freq: Once | ORAL | Status: DC
  Filled 2022-02-21: qty 2

## 2022-02-21 MED ORDER — ACETAMINOPHEN 325 MG PO TABS
650.0000 mg | ORAL_TABLET | ORAL | Status: DC | PRN
  Administered 2022-02-21 – 2022-02-24 (×8): 650 mg via ORAL
  Filled 2022-02-21 (×8): qty 2

## 2022-02-21 MED ORDER — METHYLPREDNISOLONE SODIUM SUCC 40 MG IJ SOLR
40.0000 mg | Freq: Once | INTRAMUSCULAR | Status: AC
  Administered 2022-02-21: 40 mg via INTRAVENOUS
  Filled 2022-02-21: qty 1

## 2022-02-21 MED ORDER — FLUOXETINE HCL 40 MG PO CAPS: 80.0000 mg | ORAL_CAPSULE | Freq: Every day | ORAL | Status: DC

## 2022-02-21 MED ORDER — ORAL CARE MOUTH RINSE: 15.0000 mL | OROMUCOSAL | Status: DC | PRN

## 2022-02-21 MED ORDER — INSULIN ASPART 100 UNIT/ML IJ SOLN: 0.0000 [IU] | INTRAMUSCULAR | Status: DC

## 2022-02-21 MED ORDER — GLUCAGON HCL RDNA (DIAGNOSTIC) 1 MG IJ SOLR
5.0000 mg/h | INTRAVENOUS | Status: DC
  Administered 2022-02-21 (×4): 5 mg/h via INTRAVENOUS
  Filled 2022-02-21 (×6): qty 5

## 2022-02-21 MED ORDER — FLUOXETINE HCL 20 MG PO CAPS
40.0000 mg | ORAL_CAPSULE | Freq: Every day | ORAL | Status: DC
  Administered 2022-02-21 – 2022-02-24 (×4): 40 mg via ORAL
  Filled 2022-02-21: qty 2
  Filled 2022-02-21: qty 4
  Filled 2022-02-21 (×2): qty 2

## 2022-02-21 MED ORDER — CHLORHEXIDINE GLUCONATE CLOTH 2 % EX PADS
6.0000 | MEDICATED_PAD | Freq: Every day | CUTANEOUS | Status: DC
  Administered 2022-02-21 – 2022-02-23 (×2): 6 via TOPICAL

## 2022-02-21 MED ORDER — INSULIN ASPART 100 UNIT/ML IJ SOLN
0.0000 [IU] | Freq: Three times a day (TID) | INTRAMUSCULAR | Status: DC
  Administered 2022-02-21: 5 [IU] via SUBCUTANEOUS
  Administered 2022-02-22 (×2): 3 [IU] via SUBCUTANEOUS
  Administered 2022-02-22: 2 [IU] via SUBCUTANEOUS
  Administered 2022-02-23: 3 [IU] via SUBCUTANEOUS
  Administered 2022-02-23: 5 [IU] via SUBCUTANEOUS
  Administered 2022-02-24: 2 [IU] via SUBCUTANEOUS
  Administered 2022-02-24 (×2): 8 [IU] via SUBCUTANEOUS

## 2022-02-21 NOTE — Plan of Care (Signed)
  Problem: Clinical Measurements: Goal: Cardiovascular complication will be avoided Outcome: Progressing   Problem: Activity: Goal: Risk for activity intolerance will decrease Outcome: Progressing   Problem: Nutrition: Goal: Adequate nutrition will be maintained Outcome: Progressing   Problem: Elimination: Goal: Will not experience complications related to bowel motility Outcome: Progressing Goal: Will not experience complications related to urinary retention Outcome: Progressing   Problem: Safety: Goal: Ability to remain free from injury will improve Outcome: Progressing   

## 2022-02-21 NOTE — Progress Notes (Signed)
NAME:  [name], MRN:  [id number], DOB:  March 06, 1971, LOS: 1 ADMISSION DATE:  02/20/2022, CONSULTATION DATE:  02/20/22 REFERRING MD:  ER CHIEF COMPLAINT:  overdose   History of Present Illness:  [name] is a a 51 year old woman, never smoker with history of DMII, hypertension, anxiety/depression and prior suicide attempt in 2012 who presented to [facility] ER after a suicide attempt. She took 1800 units of Novolog and 6,000mg  of naproxen around 1pm today. She denies any other ingestions.   In the ER she was noted to be hypoglycemic at 38. She was started on D10 infusion and has required multiple D50 pushes due to on going drops in her hemoglobin. She is alert and mentating. Labs are pending.   PCCM consulted for admission due to on going hypoglycemia.   She complains of right sided abdominal pain that is sharp. This pain started since she has been in the ER. She does have nausea which has improved with IV zofran. She denies any vomiting or diarrhea. Denies bloody stools. No history of GI bleeds.    Pertinent  Medical History   Past Medical History:  Diagnosis Date   Acute renal failure ([place]) 05/27/10   hemodialysis for 6 weeks   Anxiety    Asthma    Depression    Diabetes mellitus    Hypertension    Rhabdomyolysis 06/06/10   after drug overdose   Suicide attempt by drug ingestion ([place]) 06/05/10   result rhabdomyolosis and ARF requrining dialysis    Significant Hospital Events: Including procedures, antibiotic start and stop dates in addition to other pertinent events   12/14 admitted, hypoglycemia in setting of insulin OD  Interim History / Subjective:  She feels better now, eating food. Hypoglycemic overnight, on D20 infusion. CBGs have uptrended to 300s-400s this AM, will stop D20. Glucagon also stopped.   No longer having abdominal pain.   Objective   Blood pressure 118/66, pulse (!) 105, temperature 98.8 F (37.1 C), resp. rate (!) 22, height 5\' 11"  (1.803 m), weight  (!) 173.4 kg, SpO2 95 %.        Intake/Output Summary (Last 24 hours) at 02/21/2022 1010 Last data filed at 02/21/2022 0957 Gross per 24 hour  Intake 2122.84 ml  Output 600 ml  Net 1522.84 ml   Filed Weights   02/20/22 1628 02/21/22 0138  Weight: (!) 167.8 kg (!) 173.4 kg    Examination: General: obese middle aged female, laying in bed, NAD. HENT: [place]/AT, mucus membranes pink and moist. Eyes: PERRL, EOMI, anicteric sclerae. CV: normal rate and regular rhythm, no m/r/g. Pulm: CTABL, no adventitious sounds noted. Abdomen: soft, nondistended, nontender to palpation. Normoactive bowel sounds. Extremities: warm and dry, no peripheral edema. Neuro: AAOx3, no focal deficits noted.  Resolved Hospital Problem list     Assessment & Plan:  Severe Hypoglycemia DMII Last A1c of 9.7% 2 months ago. Presented with severe hypoglycemia in the setting of insulin overdose. Started on glucagon and D20 gtt. CBGs now in the 300s-400s. Stopped glucagon and D20. Adding q4 SSI but will avoid long acting insulin given intermittent hypoglycemic episodes. -q4 SSI -prn D50 pushes q15 minutes -trend CBGs frequently -on regular diet -transfer out of ICU to med-surg -PT/OT  Suicide Attempt Hx of anxiety/depression Hx of prior suicide attempts Psychiatry has evaluated patient and recommended inpatient psychiatry admission once medically stable.  -continue bedside sitter -resume home remeron qhs -resume prozac at 40mg  daily -stopped prn ativan -inpatient psychiatry  Drug Overdose -  Patient took 1800 units of Novolog and 6,000mg  of naproxen - Supportive care at this time - Monitor blood sugars and chemistry panels - holding dvt ppx given naproxen overdose - placed on PPI daily for GI prophylaxis  RUQ Abdominal Pain - resolved Lactate, lipase, and liver enzymes normal. RUQ U/S with hepatic steatosis. She is s/p cholecystectomy. -PPI as above -monitor for recurrence  Hx of hypothyroidism TSH  normal on home regimen. - continue home synthroid  Hx of asthma - continue home breo inhaler  Best Practice (right click and "Reselect all SmartList Selections" daily)   Diet/type: regular diet DVT prophylaxis: SCD - holding dvt ppx d/t NSAID overdose GI prophylaxis: PPI Lines: N/A Foley:  N/A Code Status:  full code Last date of multidisciplinary goals of care discussion [12/15 with patient]  Labs   CBC: Recent Labs  Lab 02/20/22 2043  WBC 11.2*  NEUTROABS 9.8*  HGB 11.7*  HCT 36.7  MCV 88.2  PLT 592    Basic Metabolic Panel: Recent Labs  Lab 02/20/22 1753 02/20/22 2043 02/21/22 0414  NA 144  --  140  K 3.7  --  3.8  CL 111  --  105  CO2 21*  --  24  GLUCOSE 44*  --  107*  BUN 27*  --  24*  CREATININE 1.84*  --  2.01*  CALCIUM 8.8*  --  8.8*  MG  --  1.4* 1.7  PHOS  --  1.3* 2.7   GFR: Estimated Creatinine Clearance: 57.8 mL/min (A) (by C-G formula based on SCr of 2.01 mg/dL (H)). Recent Labs  Lab 02/20/22 2043 02/20/22 2048 02/21/22 0044  WBC 11.2*  --   --   LATICACIDVEN  --  1.7 1.6    Liver Function Tests: Recent Labs  Lab 02/20/22 2043  AST 23  ALT 14  ALKPHOS 42  BILITOT 0.6  PROT 7.2  ALBUMIN 3.1*   ABG    Component Value Date/Time   PHART 7.335 (L) 03/14/2017 2245   PCO2ART 36.9 03/14/2017 2245   PO2ART 73.1 (L) 03/14/2017 2245   HCO3 25.7 04/25/2021 1037   TCO2 27 04/25/2021 1037   ACIDBASEDEF 1.0 04/25/2021 1037   O2SAT 94 04/25/2021 1037     HbA1C: HbA1c, POC (controlled diabetic range)  Date/Time Value Ref Range Status  12/24/2021 04:49 PM 9.7 (A) 0.0 - 7.0 % Final  10/01/2021 11:18 AM 8.9 (A) 0.0 - 7.0 % Final    CBG: Recent Labs  Lab 02/21/22 0503 02/21/22 0604 02/21/22 0714 02/21/22 0843 02/21/22 0908  GLUCAP 179* 229* 303* 394* 382*

## 2022-02-21 NOTE — Progress Notes (Signed)
[place] Progress Note Patient Name: [name] DOB: 1970/12/25 MRN: [id number]   Date of Service  02/21/2022  HPI/Events of Note  Patient hypoglycemic (40 mg / dl) on D 10 % gtt at 100 ml / hour.  eICU Interventions  D 10 % gtt increased to 150 ml / hour and pharmacist requested to make up D 20 % gtt which will be run at 100 ml / hour when available and D 10 gtt discontinued, Pharmacist will coordinate with bedside RN to implement the plan (discontinue D 10 when D 20 is up and running).        Kerry Kass Fontaine Hehl 02/21/2022, 2:28 AM

## 2022-02-21 NOTE — Consult Note (Addendum)
[facility] Consult   Reason for Consult:  Suicide Attempted  Referring Physician:  Freda Jackson  Patient Identification: [name] MRN:  [id number] Principal Diagnosis: Drug overdose Diagnosis:  Principal Problem:   Drug overdose   Total Time spent with patient: 15 minutes  Subjective:   [name] is a 51 y.o. female who was admitted to the local emergency department due to overdose attempt on reported insulin and Advil.  She has a charted mental health history with major depressive disorder, generalized anxiety disorder and previous suicide attempts.  Per this admission patient attempted to overdose on Advil and insulin.  [name] seen and evaluated face-to-face by this provider.  She reports " I was just feeling sad" states worsening depression related to holidays and the passing of her oldest son and father.  Reports ongoing ruminations related to depression and anxiety.  She reports previous suicide attempt in 2012.  Stated she attempted to overdose on medications at that time as well.  Stated she was inpatient at that time.  States she did follow-up with partial hospitalization programming, however state her mood continues to wax and wane.    She reports she has been medication compliant.  States she is followed by Dr. Bradley Ferris Nurse practitioner at [facility] urgent care facility.  Denied that she is followed by therapy services at this time.    Does report a family history with mental illness.  States her mother was diagnosed with depression and anxiety however she is currently deceased, states her son who is 69 years old diagnosed with social anxiety, bipolar disorder and depression.  States he is not medication compliant.   Son 12 years old, who recently passed away from seizures disorder was diagnosed with attention deficit disorder.    States the anniversary of his passing was December 5th.  She denied illicit drug use or substance abuse history.   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    She reports a good support system by her husband, son and friends. "  I do not think that I need to going to the hospital I was just having a moment."  Education provided with recommendations with inpatient admission.  Discussed voluntary status versus involuntary status.  Patient was receptive and understanding of plan.  During evaluation [name] is sitting in bed in no acute distress.  Safety sitter at bedside. She is alert/oriented x 3; calm/cooperative; presents flat, guarded but pleasant. She is speaking in a clear tone at moderate volume, and normal pace; with good eye contact. Her thought process is coherent and relevant; There is no indication that she is currently responding to internal/external stimuli or experiencing delusional thought content; and she has denied suicidal/self-harm/homicidal ideation, psychosis, and paranoia.  Patient has remained calm throughout assessment and has answered questions appropriately.     HPI:  per admission assessment note on admission:"  [name] is a 51 y.o. female with a past medical history of renal failure not currently on dialysis, anxiety, asthma, depression, diabetes mellitus, hypertension, rhabdomyolysis presenting to the emergency room for evaluation after a suicide attempt.  Per EMS patient took 1800 units of NovoLog and 6 g of naproxen around 1 PM today.  Patient states she has been feeling depressed and holiday has been difficult for her because her son passed away last year.  Patient states that right after the attempt she started to have chills and tremors.  She does report right lower quadrant abdominal pain.  Denies headache, dizziness, vision changes, vomiting, chest pain, shortness of breath, bowel changes, urinary symptoms, rash, fever."    Past Medical History:  Past Medical History:  Diagnosis Date   Acute renal  failure ([place]) 05/27/10   hemodialysis for 6 weeks   Anxiety    Asthma    Depression    Diabetes mellitus    Hypertension    Rhabdomyolysis 06/06/10   after drug overdose   Suicide attempt by drug ingestion ([place]) 06/05/10   result rhabdomyolosis and ARF requrining dialysis     Past Surgical History:  Procedure Laterality Date   ANKLE ARTHROSCOPY Right 08/17/2015   Procedure: RIGHT ANKLE ARTHROSCOPY WITH SYNOVECTOMY AND LOOSE BODY EXCISION;  Surgeon: Melrose Nakayama, MD;  Location: [facility];  Service: Orthopedics;  Laterality: Right;   CHOLECYSTECTOMY     TUBAL LIGATION  1998   Family History:  Family History  Problem Relation Age of Onset   Asthma Father    Family Psychiatric  History:  Social History:  Social History   Substance and Sexual Activity  Alcohol Use No     Social History   Substance and Sexual Activity  Drug Use No    Social History   Socioeconomic History   Marital status: Married    Spouse name: Not on file   Number of children: 2   Years of education: Not on file   Highest education level: Not on file  Occupational History   Occupation: Corporate treasurer  Tobacco Use   Smoking status: Never    Passive exposure: Never   Smokeless tobacco: Never  Vaping Use   Vaping Use: Never used  Substance and Sexual Activity   Alcohol use: No   Drug use: No   Sexual activity: Yes    Comment: with husband   Other Topics Concern   Not on file  Social History Narrative   Married high school boyfriend at age 61, two boys, still married but he has been living with other women for years.   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  Nephro recommended avoiding use.   Haldol [Haloperidol Lactate] Other (See Comments)    Tardive dyskinesia   Nsaids Other (See Comments)    Nephro recommended avoiding after acute renal failure requiring hemodialysis associated with a suicide attempt.   Entresto [Sacubitril-Valsartan] Itching   Latex Other (See Comments)    Rash around IV site    Labs:  Results for orders placed or performed during the hospital encounter of 02/20/22 (from the past 48 hour(s))  CBG monitoring, ED     Status: Abnormal   Collection Time: 02/20/22  4:29 PM  Result Value Ref Range   Glucose-Capillary 38 (LL) 70 - 99 mg/dL    Comment: Glucose reference range applies only to samples taken after fasting for at least 8 hours.   Comment 1 Notify RN   CBG monitoring,  ED (now and then every hour for 3 hours)     Status: None   Collection Time: 02/20/22  5:29 PM  Result Value Ref Range   Glucose-Capillary 70 70 - 99 mg/dL    Comment: Glucose reference range applies only to samples taken after fasting for at least 8 hours.  Basic metabolic panel     Status: Abnormal   Collection Time: 02/20/22  5:53 PM  Result Value Ref Range   Sodium 144 135 - 145 mmol/L   Potassium 3.7 3.5 - 5.1 mmol/L   Chloride 111 98 - 111 mmol/L   CO2 21 (L) 22 - 32 mmol/L   Glucose, Bld 44 (LL) 70 - 99 mg/dL    Comment: CRITICAL RESULT CALLED TO, READ BACK BY AND VERIFIED WITH TOWSLEY A,RN @ 2058 02/20/22. YFREEMAN Glucose reference range applies only to samples taken after fasting for at least 8 hours.    BUN 27 (H) 6 - 20 mg/dL   Creatinine, Ser 1.84 (H) 0.44 - 1.00 mg/dL   Calcium 8.8 (L) 8.9 - 10.3 mg/dL   GFR, Estimated 33 (L) >60 mL/min    Comment: (NOTE) Calculated using the CKD-EPI Creatinine Equation (2021)    Anion gap 12 5 - 15    Comment: Performed at [facility] [address] [place], [place] [address]  Acetaminophen level     Status: Abnormal   Collection Time: 02/20/22  5:53 PM  Result Value Ref Range   Acetaminophen (Tylenol), Serum <10 (L) 10 - 30 ug/mL    Comment: (NOTE) Therapeutic concentrations vary significantly. A range of 10-30 ug/mL  may be an effective concentration for many patients. However, some  are best treated at concentrations outside of this range. Acetaminophen concentrations >150 ug/mL at 4 hours after ingestion  and >50 ug/mL at 12 hours after ingestion are often associated with  toxic reactions.  Performed at [facility], [place] [address] [place], [place] [address]   Ethanol     Status: None   Collection Time: 02/20/22  5:53 PM  Result Value Ref Range   Alcohol, Ethyl (B) <10 <10 mg/dL    Comment: (NOTE) Lowest detectable limit for serum alcohol is 10 mg/dL.  For medical purposes only. Performed at [facility], [place] [address], [place], [place] [address]   I-Stat Beta hCG blood, ED ([facility], [facility], [facility] only)     Status: None   Collection Time: 02/20/22  6:03 PM  Result Value Ref Range   I-stat hCG, quantitative <5.0 <5 mIU/mL   Comment 3            Comment:   GEST. AGE      CONC.  (mIU/mL)   <=1 WEEK        5 - 50     2 WEEKS       50 - 500     3 WEEKS       100 - 10,000     4 WEEKS     1,000 - 30,000        FEMALE AND NON-PREGNANT FEMALE:     LESS THAN 5 mIU/mL   CBG monitoring, ED (now and then every hour for 3 hours)     Status: Abnormal   Collection Time: 02/20/22  6:10 PM  Result Value Ref Range   Glucose-Capillary 32 (LL) 70 - 99 mg/dL    Comment: Glucose reference range applies only to samples taken after fasting for at  least 8 hours.  CBG monitoring, ED (now and then every hour for 3 hours)     Status: None   Collection Time: 02/20/22  6:33 PM  Result Value Ref Range   Glucose-Capillary 88 70 - 99 mg/dL    Comment: Glucose reference range applies only to samples taken after  fasting for at least 8 hours.  CBG monitoring, ED     Status: Abnormal   Collection Time: 02/20/22  6:50 PM  Result Value Ref Range   Glucose-Capillary 58 (L) 70 - 99 mg/dL    Comment: Glucose reference range applies only to samples taken after fasting for at least 8 hours.  CBG monitoring, ED     Status: Abnormal   Collection Time: 02/20/22  7:08 PM  Result Value Ref Range   Glucose-Capillary 67 (L) 70 - 99 mg/dL    Comment: Glucose reference range applies only to samples taken after fasting for at least 8 hours.  CBG monitoring, ED     Status: Abnormal   Collection Time: 02/20/22  7:33 PM  Result Value Ref Range   Glucose-Capillary 108 (H) 70 - 99 mg/dL    Comment: Glucose reference range applies only to samples taken after fasting for at least 8 hours.  CBG monitoring, ED     Status: None   Collection Time: 02/20/22  7:49 PM  Result Value Ref Range   Glucose-Capillary 88 70 - 99 mg/dL    Comment: Glucose reference range applies only to samples taken after fasting for at least 8 hours.   Comment 1 Notify RN    Comment 2 Document in Chart   CBG monitoring, ED     Status: Abnormal   Collection Time: 02/20/22  8:05 PM  Result Value Ref Range   Glucose-Capillary 64 (L) 70 - 99 mg/dL    Comment: Glucose reference range applies only to samples taken after fasting for at least 8 hours.   Comment 1 Notify RN    Comment 2 Document in Chart   CBG monitoring, ED     Status: Abnormal   Collection Time: 02/20/22  8:23 PM  Result Value Ref Range   Glucose-Capillary 147 (H) 70 - 99 mg/dL    Comment: Glucose reference range applies only to samples taken after fasting for at least 8 hours.  Hepatic function panel     Status: Abnormal   Collection Time: 02/20/22  8:43 PM  Result Value Ref Range   Total Protein 7.2 6.5 - 8.1 g/dL   Albumin 3.1 (L) 3.5 - 5.0 g/dL   AST 23 15 - 41 U/L   ALT 14 0 - 44 U/L   Alkaline Phosphatase 42 38 - 126 U/L   Total Bilirubin 0.6 0.3 - 1.2 mg/dL    Bilirubin, Direct <0.1 0.0 - 0.2 mg/dL   Indirect Bilirubin NOT CALCULATED 0.3 - 0.9 mg/dL    Comment: Performed at [facility] [address] [place], [place] [address]  Lipase, blood     Status: None   Collection Time: 02/20/22  8:43 PM  Result Value Ref Range   Lipase 39 11 - 51 U/L    Comment: Performed at [facility] [address], [place], [place] [address]  CBC with Differential/Platelet     Status: Abnormal   Collection Time: 02/20/22  8:43 PM  Result Value Ref Range   WBC 11.2 (H) 4.0 - 10.5 K/uL   RBC 4.16 3.87 - 5.11 MIL/uL   Hemoglobin 11.7 (  L) 12.0 - 15.0 g/dL   HCT 36.7 36.0 - 46.0 %   MCV 88.2 80.0 - 100.0 fL   MCH 28.1 26.0 - 34.0 pg   MCHC 31.9 30.0 - 36.0 g/dL   RDW 12.8 11.5 - 15.5 %   Platelets 399 150 - 400 K/uL   nRBC 0.0 0.0 - 0.2 %   Neutrophils Relative % 88 %   Neutro Abs 9.8 (H) 1.7 - 7.7 K/uL   Lymphocytes Relative 7 %   Lymphs Abs 0.8 0.7 - 4.0 K/uL   Monocytes Relative 5 %   Monocytes Absolute 0.6 0.1 - 1.0 K/uL   Eosinophils Relative 0 %   Eosinophils Absolute 0.0 0.0 - 0.5 K/uL   Basophils Relative 0 %   Basophils Absolute 0.0 0.0 - 0.1 K/uL   Immature Granulocytes 0 %   Abs Immature Granulocytes 0.03 0.00 - 0.07 K/uL    Comment: Performed at [facility] [address] [place], [place] [address]  Magnesium     Status: Abnormal   Collection Time: 02/20/22  8:43 PM  Result Value Ref Range   Magnesium 1.4 (L) 1.7 - 2.4 mg/dL    Comment: Performed at [facility] [address] [place], [place] [address]  Phosphorus     Status: Abnormal   Collection Time: 02/20/22  8:43 PM  Result Value Ref Range   Phosphorus 1.3 (L) 2.5 - 4.6 mg/dL    Comment: Performed at [facility] [address] [place], [place] [address]  Protime-INR     Status: None   Collection Time: 02/20/22  8:43 PM  Result Value Ref Range   Prothrombin Time 14.5 11.4 - 15.2 seconds   INR 1.1 0.8 - 1.2    Comment: (NOTE) INR goal varies  based on device and disease states. Performed at [facility], [place] [address] [place], [place] [address]   APTT     Status: None   Collection Time: 02/20/22  8:43 PM  Result Value Ref Range   aPTT 29 24 - 36 seconds    Comment: Performed at [facility] [address], [place], [place] [address]  Type and screen [place]     Status: None   Collection Time: 02/20/22  8:45 PM  Result Value Ref Range   ABO/RH(D) A POS    Antibody Screen NEG    Sample Expiration      02/23/2022,2359 Performed at [facility], [place] [address], [place], [place] [address]   TSH     Status: None   Collection Time: 02/20/22  8:45 PM  Result Value Ref Range   TSH 1.015 0.350 - 4.500 uIU/mL    Comment: Performed by a 3rd Generation assay with a functional sensitivity of <=0.01 uIU/mL. Performed at [facility], [place] [address], [place], [place] [address]   Salicylate level     Status: Abnormal   Collection Time: 02/20/22  8:48 PM  Result Value Ref Range   Salicylate Lvl <3.2 (L) 7.0 - 30.0 mg/dL    Comment: Performed at [facility] [address], [place], [place] [address]  Lactic acid, plasma     Status: None   Collection Time: 02/20/22  8:48 PM  Result Value Ref Range   Lactic Acid, Venous 1.7 0.5 - 1.9 mmol/L    Comment: Performed at [facility] [address], [place],  [address]  CBG monitoring, ED     Status: None   Collection Time: 02/20/22  8:56 PM  Result Value Ref Range   Glucose-Capillary 95 70 - 99 mg/dL    Comment: Glucose reference range applies only to samples taken after fasting for at least 8 hours.   Comment 1 Notify RN    Comment 2 Document in Chart   CBG monitoring, ED     Status: None   Collection Time: 02/20/22  9:13 PM  Result Value Ref Range   Glucose-Capillary 80 70 - 99 mg/dL    Comment: Glucose reference range applies only to samples taken after fasting for at least 8 hours.   Comment 1 Notify RN     Comment 2 Document in Chart   CBG monitoring, ED     Status: None   Collection Time: 02/20/22  9:28 PM  Result Value Ref Range   Glucose-Capillary 71 70 - 99 mg/dL    Comment: Glucose reference range applies only to samples taken after fasting for at least 8 hours.   Comment 1 Notify RN    Comment 2 Document in Chart   CBG monitoring, ED     Status: Abnormal   Collection Time: 02/20/22 10:12 PM  Result Value Ref Range   Glucose-Capillary 110 (H) 70 - 99 mg/dL    Comment: Glucose reference range applies only to samples taken after fasting for at least 8 hours.   Comment 1 Notify RN    Comment 2 Document in Chart   CBG monitoring, ED     Status: None   Collection Time: 02/20/22 10:45 PM  Result Value Ref Range   Glucose-Capillary 91 70 - 99 mg/dL    Comment: Glucose reference range applies only to samples taken after fasting for at least 8 hours.   Comment 1 Notify RN    Comment 2 Document in Chart   CBG monitoring, ED     Status: None   Collection Time: 02/20/22 11:06 PM  Result Value Ref Range   Glucose-Capillary 72 70 - 99 mg/dL    Comment: Glucose reference range applies only to samples taken after fasting for at least 8 hours.   Comment 1 Notify RN    Comment 2 Document in Chart   CBG monitoring, ED     Status: Abnormal   Collection Time: 02/20/22 11:27 PM  Result Value Ref Range   Glucose-Capillary 125 (H) 70 - 99 mg/dL    Comment: Glucose reference range applies only to samples taken after fasting for at least 8 hours.   Comment 1 Notify RN    Comment 2 Document in Chart   CBG monitoring, ED     Status: Abnormal   Collection Time: 02/20/22 11:49 PM  Result Value Ref Range   Glucose-Capillary 112 (H) 70 - 99 mg/dL    Comment: Glucose reference range applies only to samples taken after fasting for at least 8 hours.   Comment 1 Notify RN    Comment 2 Document in Chart   CBG monitoring, ED     Status: None   Collection Time: 02/21/22 12:09 AM  Result Value Ref Range    Glucose-Capillary 82 70 - 99 mg/dL    Comment: Glucose reference range applies only to samples taken after fasting for at least 8 hours.   Comment 1 Notify RN    Comment 2 Document in Chart   CBG monitoring, ED     Status: None   Collection Time: 02/21/22 12:29 AM  Result Value Ref Range   Glucose-Capillary 76 70 - 99 mg/dL    Comment: Glucose reference range applies only to samples taken after fasting for at least 8 hours.   Comment 1 Notify RN    Comment 2 Document in Chart   Lactic acid, plasma     Status: None   Collection Time: 02/21/22 12:44 AM  Result Value Ref Range   Lactic Acid, Venous 1.6 0.5 - 1.9 mmol/L    Comment: Performed at [facility], [address], [place], [place] [address]  CBG monitoring, ED     Status: Abnormal   Collection Time: 02/21/22 12:53 AM  Result Value Ref Range   Glucose-Capillary 107 (H) 70 - 99 mg/dL    Comment: Glucose reference range applies only to samples taken after fasting for at least 8 hours.   Comment 1 Notify RN    Comment 2 Document in Chart   CBG monitoring, ED     Status: Abnormal   Collection Time: 02/21/22  1:13 AM  Result Value Ref Range   Glucose-Capillary 66 (L) 70 - 99 mg/dL    Comment: Glucose reference range applies only to samples taken after fasting for at least 8 hours.   Comment 1 Notify RN    Comment 2 Document in Chart   Glucose, capillary     Status: Abnormal   Collection Time: 02/21/22  1:28 AM  Result Value Ref Range   Glucose-Capillary 129 (H) 70 - 99 mg/dL    Comment: Glucose reference range applies only to samples taken after fasting for at least 8 hours.  Glucose, capillary     Status: Abnormal   Collection Time: 02/21/22  2:13 AM  Result Value Ref Range   Glucose-Capillary 40 (LL) 70 - 99 mg/dL    Comment: Glucose reference range applies only to samples taken after fasting for at least 8 hours.   Comment 1 Notify RN    Comment 2 Document in Chart   Glucose, capillary     Status: Abnormal    Collection Time: 02/21/22  2:33 AM  Result Value Ref Range   Glucose-Capillary 114 (H) 70 - 99 mg/dL    Comment: Glucose reference range applies only to samples taken after fasting for at least 8 hours.   Comment 1 Notify RN    Comment 2 Document in Chart   MRSA Next Gen by PCR, Nasal     Status: None   Collection Time: 02/21/22  2:51 AM   Specimen: Nasal Mucosa; Nasal Swab  Result Value Ref Range   MRSA by PCR Next Gen NOT DETECTED NOT DETECTED    Comment: (NOTE) The GeneXpert MRSA Assay (FDA approved for NASAL specimens only), is one component of a comprehensive MRSA colonization surveillance program. It is not intended to diagnose MRSA infection nor to guide or monitor treatment for MRSA infections. Test performance is not FDA approved in patients less than 76 years old. Performed at [facility], [place] [address] [place], [place] [address]   Glucose, capillary     Status: Abnormal   Collection Time: 02/21/22  2:54 AM  Result Value Ref Range   Glucose-Capillary 103 (H) 70 - 99 mg/dL    Comment: Glucose reference range applies only to samples taken after fasting for at least 8 hours.   Comment 1 Notify RN    Comment 2 Document in Chart   Glucose, capillary     Status: None   Collection Time: 02/21/22  3:15 AM  Result Value Ref Range   Glucose-Capillary 99 70 - 99 mg/dL    Comment: Glucose reference range applies only to samples taken after fasting for at least 8 hours.   Comment 1 Notify RN    Comment 2 Document in Chart   Glucose, capillary     Status: None   Collection Time: 02/21/22  3:40 AM  Result Value Ref Range   Glucose-Capillary 81 70 - 99 mg/dL    Comment: Glucose reference range applies only to samples taken after fasting for at least 8 hours.   Comment 1 Notify RN    Comment 2 Document in Chart   Glucose, capillary     Status: None   Collection Time: 02/21/22  3:56 AM  Result Value Ref Range   Glucose-Capillary 80 70 - 99 mg/dL    Comment: Glucose  reference range applies only to samples taken after fasting for at least 8 hours.   Comment 1 Notify RN    Comment 2 Document in Chart   Basic metabolic panel     Status: Abnormal   Collection Time: 02/21/22  4:14 AM  Result Value Ref Range   Sodium 140 135 - 145 mmol/L   Potassium 3.8 3.5 - 5.1 mmol/L   Chloride 105 98 - 111 mmol/L   CO2 24 22 - 32 mmol/L   Glucose, Bld 107 (H) 70 - 99 mg/dL    Comment: Glucose reference range applies only to samples taken after fasting for at least 8 hours.   BUN 24 (H) 6 - 20 mg/dL   Creatinine, Ser 2.01 (H) 0.44 - 1.00 mg/dL   Calcium 8.8 (L) 8.9 - 10.3 mg/dL   GFR, Estimated 29 (L) >60 mL/min    Comment: (NOTE) Calculated using the CKD-EPI Creatinine Equation (2021)    Anion gap 11 5 - 15    Comment: Performed at [facility] [address] [place], [place] [address]  Magnesium     Status: None   Collection Time: 02/21/22  4:14 AM  Result Value Ref Range   Magnesium 1.7 1.7 - 2.4 mg/dL    Comment: Performed at [facility] [address], [place], [place] [address]  Phosphorus     Status: None   Collection Time: 02/21/22  4:14 AM  Result Value Ref Range   Phosphorus 2.7 2.5 - 4.6 mg/dL    Comment: Performed at [facility] [address] [place], [place] [address]  Glucose, capillary     Status: Abnormal   Collection Time: 02/21/22  4:16 AM  Result Value Ref Range   Glucose-Capillary 129 (H) 70 - 99 mg/dL    Comment: Glucose reference range applies only to samples taken after fasting for at least 8 hours.  Glucose, capillary     Status: Abnormal   Collection Time: 02/21/22  4:31 AM  Result Value Ref Range   Glucose-Capillary 120 (H) 70 - 99 mg/dL    Comment: Glucose reference range applies only to samples taken after fasting for at least 8 hours.  Glucose, capillary     Status: Abnormal   Collection Time: 02/21/22  5:03 AM  Result Value Ref Range   Glucose-Capillary 179 (H) 70 - 99 mg/dL    Comment: Glucose  reference range applies only to samples taken after fasting for at least 8 hours.   Comment 1 Notify RN    Comment 2 Document in Chart   Glucose, capillary  Status: Abnormal   Collection Time: 02/21/22  6:04 AM  Result Value Ref Range   Glucose-Capillary 229 (H) 70 - 99 mg/dL    Comment: Glucose reference range applies only to samples taken after fasting for at least 8 hours.   Comment 1 Notify RN    Comment 2 Document in Chart   Glucose, capillary     Status: Abnormal   Collection Time: 02/21/22  7:14 AM  Result Value Ref Range   Glucose-Capillary 303 (H) 70 - 99 mg/dL    Comment: Glucose reference range applies only to samples taken after fasting for at least 8 hours.  Glucose, capillary     Status: Abnormal   Collection Time: 02/21/22  8:43 AM  Result Value Ref Range   Glucose-Capillary 394 (H) 70 - 99 mg/dL    Comment: Glucose reference range applies only to samples taken after fasting for at least 8 hours.  Glucose, capillary     Status: Abnormal   Collection Time: 02/21/22  9:08 AM  Result Value Ref Range   Glucose-Capillary 382 (H) 70 - 99 mg/dL    Comment: Glucose reference range applies only to samples taken after fasting for at least 8 hours.  Glucose, capillary     Status: Abnormal   Collection Time: 02/21/22 10:11 AM  Result Value Ref Range   Glucose-Capillary 442 (H) 70 - 99 mg/dL    Comment: Glucose reference range applies only to samples taken after fasting for at least 8 hours.    Current Facility-Administered Medications  Medication Dose Route Frequency Provider Last Rate Last Admin   0.9 %  sodium chloride infusion   Intravenous PRN Jacky Kindle, MD   Stopped at 02/21/22 6010   acetaminophen (TYLENOL) tablet 650 mg  650 mg Oral Q4H PRN Frederik Pear, MD   650 mg at 02/21/22 0208   Chlorhexidine Gluconate Cloth 2 % PADS 6 each  6 each Topical Daily Freddi Starr, MD   6 each at 02/21/22 0145   dextrose 50 % solution 50 mL  1 ampule Intravenous Q15  min PRN Frederik Pear, MD       docusate sodium (COLACE) capsule 100 mg  100 mg Oral BID PRN Freddi Starr, MD       fluticasone furoate-vilanterol (BREO ELLIPTA) 200-25 MCG/ACT 1 puff  1 puff Inhalation Daily Freddi Starr, MD   1 puff at 02/21/22 0811   glucagon (human recombinant) (GLUCAGEN) injection 1 mg  1 mg Intravenous Once Freddi Starr, MD       levothyroxine (SYNTHROID) tablet 300 mcg  300 mcg Oral Q0600 Freda Jackson B, MD   300 mcg at 02/21/22 0531   LORazepam (ATIVAN) injection 1 mg  1 mg Intravenous Q6H PRN Freddi Starr, MD   1 mg at 02/21/22 0217   ondansetron (ZOFRAN) injection 4 mg  4 mg Intravenous Q6H PRN Freddi Starr, MD       Oral care mouth rinse  15 mL Mouth Rinse PRN Freddi Starr, MD       pantoprazole (PROTONIX) injection 40 mg  40 mg Intravenous Q24H Freda Jackson B, MD   40 mg at 02/20/22 2134   polyethylene glycol (MIRALAX / GLYCOLAX) packet 17 g  17 g Oral Daily PRN Freddi Starr, MD       sodium chloride flush (NS) 0.9 % injection 3 mL  3 mL Intravenous Q12H Rex Kras, PA        Musculoskeletal:  Reserved resting in bed    Psychiatric Specialty Exam:  Presentation  General Appearance: Appropriate for Environment  Eye Contact:Good  Speech:Clear and Coherent  Speech Volume:Decreased  Handedness:Right   Mood and Affect  Mood:Depressed; Anxious  Affect:Congruent   Thought Process  Thought Processes:Coherent  Descriptions of Associations:Intact  Orientation:Full (Time, Place and Person)  Thought Content:Logical  History of Schizophrenia/Schizoaffective disorder:No data recorded Duration of Psychotic Symptoms:No data recorded Hallucinations:Hallucinations: None  Ideas of Reference:None  Suicidal Thoughts:Suicidal Thoughts: Yes, Active SI Active Intent and/or Plan: With Intent; With Plan  Homicidal Thoughts:Homicidal Thoughts: No   Sensorium  Memory:Immediate Fair; Recent Fair; Remote  [place]   Executive Functions  Concentration:Good  Attention Span:Good  [place]   Psychomotor Activity  Psychomotor Activity:Psychomotor Activity: Normal   Assets  Assets:Desire for Improvement; Social Support   Sleep  Sleep:Sleep: Fair   Physical Exam: Physical Exam Vitals reviewed.  Constitutional:      Appearance: She is obese.  Cardiovascular:     Rate and Rhythm: Normal rate and regular rhythm.  Pulmonary:     Effort: Pulmonary effort is normal.     Breath sounds: Normal breath sounds.  Neurological:     Mental Status: She is alert.  Psychiatric:        Mood and Affect: Mood normal.        Thought Content: Thought content normal.    Review of Systems  Psychiatric/Behavioral:  Positive for depression and suicidal ideas. The patient is nervous/anxious.   All other systems reviewed and are negative.  Blood pressure 118/66, pulse (!) 105, temperature 98.8 F (37.1 C), resp. rate (!) 22, height 5\' 11"  (1.803 m), weight (!) 173.4 kg, SpO2 95 %. Body mass index is 53.32 kg/m.  Treatment Plan Summary: Daily contact with patient to assess and evaluate symptoms and progress in treatment and Medication management  -Continue Prozac 40 mg daily - Continue Remeron 30 mg p.o. nightly ( AST/ALT 23/14)  -BUN 24, Creatinine 2.1, TSH 1.05 continue to monitor EKG- Qt/OTc- 401/507  Transition of care consult was placed for inpatient admission once medically cleared  Disposition: Recommend psychiatric Inpatient admission when medically cleared.  Derrill Center, NP 02/21/2022 11:39 AM

## 2022-02-21 NOTE — Progress Notes (Signed)
Patient latest CBG 229, Elink notified.

## 2022-02-21 NOTE — Progress Notes (Signed)
[facility] Progress Note Patient Name: [name] DOB: [birth year] MRN: [id number]   Date of Service  02/21/2022  HPI/Events of Note  Patient with recurrent hypoglycemia. She also has not voided, bladder scan suggests significant urinary retention.  eICU Interventions  D 10 gtt ordered x 10 hours, diet advanced, in / out bladder catheterization x 1 ordered, PRN Tylenol ordered for pain.        Kerry Kass Niklaus Mamaril 02/21/2022, 1:55 AM

## 2022-02-21 NOTE — Progress Notes (Signed)
51 year old female with history of T2DM, HTN, anxiety/depression, prior suicide attempt (2012) who presented with hypoglycemia following attempted suicide with overdose of insulin novolog and naproxen. Has been intermittently hypoglycemic requiring glucagon and D20 infusion. CBGs have improved and now hyperglycemic (200s-300s). Stopped glucagon and D20. Now on SSI q4h, tolerating regular diet. Will go to inpatient psych once medically stable. Transferring out of ICU to med-surg for [facility] Medicine Teaching Service to pick up from tomorrow at [facility]. Discussed with Dr. Madison Hickman.

## 2022-02-21 NOTE — Progress Notes (Signed)
Patient with decreasing blood sugars despite being on D20. Elink notified for further instructions on when to treat, per Elink MD treat with D50 to keep blood glucose greater than 100. Elink MD to order glucagon drip and rate increase on D20.

## 2022-02-21 NOTE — Progress Notes (Signed)
Patient with hypoglycemic events Elink notified D10 drip started, patient with subsequent hypoglycemic event Elink notified D50 given, D10 changed to D20.   Patient verbalized abdominal discomfort and not voiding since arrival to hospital, bladder scan not able to pick up full volume reading 913 in certain areas. Elink notified, orders for tylenol and in and out cath given. Patient refusing in and out cath at this time became extremely tearful, anxious and requesting Ativan for anxiety, Ativan given. Will attempt in and out cath at later time if patient unable to spontaneously void.

## 2022-02-21 NOTE — Progress Notes (Signed)
Patient with blood sugars over 100, last CBG 179. Elink notified, no new orders at this time. Will continue to monitor frequent CBGs.

## 2022-02-21 NOTE — Plan of Care (Signed)

## 2022-02-21 NOTE — Hospital Course (Signed)
[name] is a 51 y.o.female with a history of T2DM, HTN, anxiety/depression, hypothyroidism, HLD, CKD 3, and prior suicide attempt in 2012 who was admitted to the [facility] Medicine Teaching Service at [facility] for attempted suicide with insulin and naproxen. Her hospital course is detailed below:  Suicide attempt Presented to [facility] ED per EMS after taking 1800 units of NovoLog and 6 g of naproxen with CBG 38. Patient was admitted to ICU on D10 infusion after receiving several D50 ampules. D10 infusion was continued with additional measures of glucagon and D50 pushes.  She was placed on daily PPI for GI prophylaxis regarding naproxen ingestion. Patient was transferred to floor status on 12/16 with SSI and attempts at avoiding long-acting insulin given intermittent hypoglycemic episodes. Psychiatry evaluated patient and recommended resuming her home medications of Prozac and Remeron. Transferred to inpatient behavioral health on 12/18.  Other chronic conditions were medically managed with home medications and formulary alternatives as necessary (T2DM, CKD, HLD, hypothyroidism, asthma)  PCP Follow-up Recommendations:  Changed from Lasix 40mg  BID to daily dosing, monitor volume status. Titrate up long acting insulin based on CBGs Decrease to Metformin 1500mg  daily for renal dosing

## 2022-02-21 NOTE — Progress Notes (Signed)
Inpatient Diabetes Program Recommendations  AACE/ADA: New Consensus Statement on Inpatient Glycemic Control (2015)  Target Ranges:  Prepandial:   less than 140 mg/dL      Peak postprandial:   less than 180 mg/dL (1-2 hours)      Critically ill patients:  140 - 180 mg/dL   Lab Results  Component Value Date   GLUCAP 427 (H) 02/21/2022   HGBA1C 9.7 (A) 12/24/2021    Review of Glycemic Control  Latest Reference Range & Units 02/21/22 06:04 02/21/22 07:14 02/21/22 08:43 02/21/22 09:08 02/21/22 10:11 02/21/22 11:12  Glucose-Capillary 70 - 99 mg/dL 229 (H) 303 (H) 394 (H) 382 (H) 442 (H) 427 (H)   Diabetes history: DM Outpatient Diabetes medications:  Novolog 15-20 units bid Basaglar 65 units bid Current orders for Inpatient glycemic control:  None  Inpatient Diabetes Program Recommendations:    Note that patient admitted with suicide attempt using insulin. Dextrose has now been discontinued.   Recommend adding Novolog correction q 4 hours and possibly Semglee 30 units daily. Note plans for inpatient psych admit as well.   Thanks,  Adah Perl, RN, BC-ADM Inpatient Diabetes Coordinator Pager [phone]  (8a-5p)

## 2022-02-22 ENCOUNTER — Inpatient Hospital Stay (HOSPITAL_COMMUNITY): Payer: BC Managed Care – PPO

## 2022-02-22 DIAGNOSIS — T1491XA Suicide attempt, initial encounter: Secondary | ICD-10-CM

## 2022-02-22 DIAGNOSIS — N179 Acute kidney failure, unspecified: Secondary | ICD-10-CM

## 2022-02-22 DIAGNOSIS — T50902A Poisoning by unspecified drugs, medicaments and biological substances, intentional self-harm, initial encounter: Secondary | ICD-10-CM

## 2022-02-22 LAB — COMPREHENSIVE METABOLIC PANEL
ALT: 13 U/L (ref 0–44)
AST: 16 U/L (ref 15–41)
Albumin: 2.9 g/dL — ABNORMAL LOW (ref 3.5–5.0)
Alkaline Phosphatase: 34 U/L — ABNORMAL LOW (ref 38–126)
Anion gap: 8 (ref 5–15)
BUN: 31 mg/dL — ABNORMAL HIGH (ref 6–20)
CO2: 24 mmol/L (ref 22–32)
Calcium: 8.6 mg/dL — ABNORMAL LOW (ref 8.9–10.3)
Chloride: 106 mmol/L (ref 98–111)
Creatinine, Ser: 2.11 mg/dL — ABNORMAL HIGH (ref 0.44–1.00)
GFR, Estimated: 28 mL/min — ABNORMAL LOW (ref >60)
Glucose, Bld: 151 mg/dL — ABNORMAL HIGH (ref 70–99)
Potassium: 5.1 mmol/L (ref 3.5–5.1)
Sodium: 138 mmol/L (ref 135–145)
Total Bilirubin: 0.5 mg/dL (ref 0.3–1.2)
Total Protein: 6.7 g/dL (ref 6.5–8.1)

## 2022-02-22 LAB — CBC WITH DIFFERENTIAL/PLATELET
Abs Immature Granulocytes: 0.05 10*3/uL (ref 0.00–0.07)
Basophils Absolute: 0 10*3/uL (ref 0.0–0.1)
Basophils Relative: 0 %
Eosinophils Absolute: 0.2 10*3/uL (ref 0.0–0.5)
Eosinophils Relative: 2 %
HCT: 31.5 % — ABNORMAL LOW (ref 36.0–46.0)
Hemoglobin: 9.9 g/dL — ABNORMAL LOW (ref 12.0–15.0)
Immature Granulocytes: 0 %
Lymphocytes Relative: 29 %
Lymphs Abs: 3.4 10*3/uL (ref 0.7–4.0)
MCH: 27.4 pg (ref 26.0–34.0)
MCHC: 31.4 g/dL (ref 30.0–36.0)
MCV: 87.3 fL (ref 80.0–100.0)
Monocytes Absolute: 0.9 10*3/uL (ref 0.1–1.0)
Monocytes Relative: 7 %
Neutro Abs: 7.1 10*3/uL (ref 1.7–7.7)
Neutrophils Relative %: 62 %
Platelets: 374 10*3/uL (ref 150–400)
RBC: 3.61 MIL/uL — ABNORMAL LOW (ref 3.87–5.11)
RDW: 13 % (ref 11.5–15.5)
WBC: 11.7 10*3/uL — ABNORMAL HIGH (ref 4.0–10.5)
nRBC: 0 % (ref 0.0–0.2)

## 2022-02-22 LAB — GLUCOSE, CAPILLARY
Glucose-Capillary: 140 mg/dL — ABNORMAL HIGH (ref 70–99)
Glucose-Capillary: 146 mg/dL — ABNORMAL HIGH (ref 70–99)
Glucose-Capillary: 179 mg/dL — ABNORMAL HIGH (ref 70–99)
Glucose-Capillary: 195 mg/dL — ABNORMAL HIGH (ref 70–99)
Glucose-Capillary: 131 mg/dL — ABNORMAL HIGH (ref 70–99)

## 2022-02-22 LAB — MAGNESIUM: Magnesium: 2 mg/dL (ref 1.7–2.4)

## 2022-02-22 LAB — PHOSPHORUS: Phosphorus: 3.4 mg/dL (ref 2.5–4.6)

## 2022-02-22 MED ORDER — IOHEXOL 9 MG/ML PO SOLN
500.0000 mL | ORAL | Status: AC
  Administered 2022-02-22 (×2): 500 mL via ORAL

## 2022-02-22 MED ORDER — POLYETHYLENE GLYCOL 3350 17 G PO PACK
17.0000 g | PACK | Freq: Every day | ORAL | Status: DC
  Administered 2022-02-23: 17 g via ORAL
  Filled 2022-02-22 (×2): qty 1

## 2022-02-22 MED ORDER — ALBUTEROL SULFATE (2.5 MG/3ML) 0.083% IN NEBU: 2.5000 mg | INHALATION_SOLUTION | RESPIRATORY_TRACT | Status: DC | PRN

## 2022-02-22 MED ORDER — METOPROLOL TARTRATE 25 MG PO TABS
25.0000 mg | ORAL_TABLET | Freq: Two times a day (BID) | ORAL | Status: DC
  Administered 2022-02-22 – 2022-02-24 (×5): 25 mg via ORAL
  Filled 2022-02-22 (×5): qty 1

## 2022-02-22 MED ORDER — ISOSORB DINITRATE-HYDRALAZINE 20-37.5 MG PO TABS
1.0000 | ORAL_TABLET | Freq: Three times a day (TID) | ORAL | Status: DC
  Administered 2022-02-22 – 2022-02-24 (×7): 1 via ORAL
  Filled 2022-02-22 (×9): qty 1

## 2022-02-22 MED ORDER — IOHEXOL 9 MG/ML PO SOLN: 500.0000 mL | ORAL | Status: AC

## 2022-02-22 MED ORDER — ROSUVASTATIN CALCIUM 20 MG PO TABS
20.0000 mg | ORAL_TABLET | Freq: Every day | ORAL | Status: DC
  Administered 2022-02-22 – 2022-02-24 (×3): 20 mg via ORAL
  Filled 2022-02-22 (×3): qty 1

## 2022-02-22 MED ORDER — ENOXAPARIN SODIUM 100 MG/ML IJ SOSY
0.5000 mg/kg | PREFILLED_SYRINGE | INTRAMUSCULAR | Status: DC
  Administered 2022-02-22 – 2022-02-24 (×3): 87.5 mg via SUBCUTANEOUS
  Filled 2022-02-22 (×3): qty 0.88

## 2022-02-22 NOTE — Evaluation (Signed)
Physical Therapy Evaluation and Discharge Patient Details Name: [name] MRN: [id number] DOB: Jul 10, 1970 Today's Date: 02/22/2022  History of Present Illness  Pt is 51 yo female who presents to ED on 02/20/22 with suicide attempt by overdose. Pt with abdominal pain and hypoglycemia in ED. PMH: previous suicide attempt 2012, DM2, depression, HTN  Clinical Impression  Patient evaluated by Physical Therapy with no further acute PT needs identified. All education has been completed and the patient has no further questions. Pt from home with husband and son, works as Research officer, political [employer]. She reports Christmas time is always a hard time of year for her as it is the birthday of her oldest son who passed away 7 yrs ago. Pt independent with mobility but used RW as she has an ankle brace that was not present on eval. Would benefit from bariatric rollator for use in community for energy conservation and when ankle is bothering her. Pt ambulated 300' with RW, mod I. Will ask mobility team to follow her, no further acute PT needs. See below for any follow-up Physical Therapy or equipment needs. PT is signing off. Thank you for this referral.        Recommendations for follow up therapy are one component of a multi-disciplinary discharge planning process, led by the attending physician.  Recommendations may be updated based on patient status, additional functional criteria and insurance authorization.  Follow Up Recommendations No PT follow up      Assistance Recommended at Discharge Intermittent Supervision/Assistance  Patient can return home with the following  Direct supervision/assist for medications management;Help with stairs or ramp for entrance    Equipment Recommendations Other (comment) (bariatric rollator)  Recommendations for Other Services       Functional Status Assessment Patient has not had a recent decline in their functional status     Precautions / Restrictions  Precautions Precautions: Fall Precaution Comments: old R ankle injury, usually wears a brace but doesn't have it here Restrictions Weight Bearing Restrictions: No      Mobility  Bed Mobility Overal bed mobility: Modified Independent                  Transfers Overall transfer level: Modified independent Equipment used: Rolling walker (2 wheels)                    Ambulation/Gait Ambulation/Gait assistance: Modified independent (Device/Increase time) Gait Distance (Feet): 300 Feet Assistive device: Rolling walker (2 wheels) Gait Pattern/deviations: Step-through pattern Gait velocity: decreased Gait velocity interpretation: >2.62 ft/sec, indicative of community ambulatory   General Gait Details: ambulated with RW for safety since she didn't have her ankle brace  Stairs            Wheelchair Mobility    Modified Rankin (Stroke Patients Only)       Balance Overall balance assessment: Mild deficits observed, not formally tested                                           Pertinent Vitals/Pain Pain Assessment Pain Assessment: Faces Faces Pain Scale: Hurts little more Pain Location: RLQ Pain Descriptors / Indicators: Cramping, Sore Pain Intervention(s): Limited activity within patient's tolerance, Monitored during session    Home Living Family/patient expects to be discharged to:: Private residence Living Arrangements: Spouse/significant other Available Help at Discharge: Family Type of Home: House Home Access: Level  entry     Alternate Level Stairs-Number of Steps: stairs Home Layout: Multi-level Home Equipment: Kasandra Knudsen - single point Additional Comments: 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 Husband works. Son is at home "trying to find himself"    Prior Function Prior Level of Function : Independent/Modified Independent             Mobility Comments: uses SPC when needed       Hand  Dominance   Dominant Hand: Right    Extremity/Trunk Assessment   Upper Extremity Assessment Upper Extremity Assessment: Defer to OT evaluation    Lower Extremity Assessment Lower Extremity Assessment: RLE deficits/detail RLE Deficits / Details: weakness R ankle from old fx RLE Sensation: WNL RLE Coordination: WNL    Cervical / Trunk Assessment Cervical / Trunk Assessment: Normal  Communication   Communication: No difficulties  Cognition Arousal/Alertness: Awake/alert Behavior During Therapy: WFL for tasks assessed/performed Overall Cognitive Status: Within Functional Limits for tasks assessed                                 General Comments: reports Christmas season is always hard for her. Lost her oldest son and his birthday was around this time        General Comments General comments (skin integrity, edema, etc.): VSS, SPO2 97% on RA, HR 105 bpm    Exercises     Assessment/Plan    PT Assessment Patient does not need any further PT services  PT Problem List         PT Treatment Interventions      PT Goals (Current goals can be found in the Care Plan section)  Acute Rehab PT Goals Patient Stated Goal: be more stable emotionally PT Goal Formulation: All assessment and education complete, DC therapy    Frequency       Co-evaluation               AM-PAC PT "6 Clicks" Mobility  Outcome Measure Help needed turning from your back to your side while in a flat bed without using bedrails?: None Help needed moving from lying on your back to sitting on the side of a flat bed without using bedrails?: None Help needed moving to and from a bed to a chair (including a wheelchair)?: None Help needed standing up from a chair using your arms (e.g., wheelchair or bedside chair)?: None Help needed to walk in hospital room?: None Help needed climbing 3-5 steps with a railing? : A Little 6 Click Score: 23    End of Session Equipment Utilized During  Treatment: Gait belt Activity Tolerance: Patient tolerated treatment well Patient left: in chair;with call bell/phone within reach Nurse Communication: Mobility status PT Visit Diagnosis: Unsteadiness on feet (R26.81);Difficulty in walking, not elsewhere classified (R26.2)    Time: 1157-2620 PT Time Calculation (min) (ACUTE ONLY): 26 min   Charges:   PT Evaluation $PT Eval Moderate Complexity: 1 Mod PT Treatments $Gait Training: 8-22 mins        Leighton Roach, PT  Acute Rehab Services Secure chat preferred Office [place] 02/22/2022, 10:01 AM

## 2022-02-22 NOTE — Assessment & Plan Note (Signed)
>>  ASSESSMENT AND PLAN FOR SUICIDE ATTEMPT (HCC) WRITTEN ON 02/23/2022  5:00 AM BY LILLAND, ALANA, DO  Insulin and Naproxen overdose, now with stable CBGs. Is high risk due to holiday season and anniversary of the death of her son and father. Meet's impatient psychiatric criteria. - Psychiatry following, appreciate recs - Continue Prozac 40mg  daily - Continue remeron 30mg  QHS - Daily BMP

## 2022-02-22 NOTE — Assessment & Plan Note (Addendum)
Patient reports significant abdominal pain this am, worst in RLQ with associated tenderness to palpation. -CT abdomen/pelvis with contrast -Miralax daily -Continue Protonix daily

## 2022-02-22 NOTE — Consult Note (Signed)
[facility] Consult   Reason for Consult:  suicidal attempt Referring Physician:  Dr. Owens Shark Patient Identification: [name] MRN:  [id number] Principal Diagnosis: Suicide attempt [facility]) Diagnosis:  Principal Problem:   Suicide attempt [facility]) Active Problems:   Type II diabetes mellitus with complication ([place])   Abdominal pain   Grief at loss of child   Essential hypertension   Generalized anxiety disorder   Depression   Acute-on-chronic kidney injury ([place])   Total Time spent with patient: 30 minutes  Subjective:   [name] is a 51 y.o. female patient admitted after taking 1800 units of NovoLog and 6 g of naproxen with CBG 38.  Patient was admitted to ICU on D10 infusion wit after receiving several D50 ampules.  D10 infusion was continued with additional measures of glucagon and D50 pushes.  She was placed on daily PPI for GI prophylaxis regarding naproxen ingestion.  Patient was transferred to floor status on 12/16 with measures of only SSI and attempts at avoiding long-acting insulin given intermittent hypoglycemic episodes.  Psychiatry evaluated patient and recommended inpatient psychiatry admission once medically stable while resuming her home medications of Prozac and Remeron.  Psychiatry saw the patient today for follow-up.  Subjective: On assessment patient reports feeling "still sad", although "not suicidal". She says "I hope tomorrow I can go to Psych and then home". She reports good sleep and appetite after meds were restarted. She denies any symptoms of psychosis - denies auditory or visual hallucinations, denies feeling paranoid, unsafe, does not express any delusions. He denies thoughts or plans of hurting self or others. She reports no side effects from medications she is getting here. She denies any physical complaints.  Past Medical History:  Past Medical History:  Diagnosis Date   Acute renal failure ([place]) 05/27/10   hemodialysis for 6 weeks    Anxiety    Asthma    Depression    Diabetes mellitus    Hypertension    Rhabdomyolysis 06/06/10   after drug overdose   Suicide attempt by drug ingestion ([place]) 06/05/10   result rhabdomyolosis and ARF requrining dialysis     Past Surgical History:  Procedure Laterality Date   ANKLE ARTHROSCOPY Right 08/17/2015   Procedure: RIGHT ANKLE ARTHROSCOPY WITH SYNOVECTOMY AND LOOSE BODY EXCISION;  Surgeon: Melrose Nakayama, MD;  Location: [facility];  Service: Orthopedics;  Laterality: Right;   CHOLECYSTECTOMY     TUBAL LIGATION  1998   Family History:  Family History  Problem Relation Age of Onset   Asthma Father    Family Psychiatric  History:  Social History:  Social History   Substance and Sexual Activity  Alcohol Use No     Social History   Substance and Sexual Activity  Drug Use No    Social History   Socioeconomic History   Marital status: Married    Spouse name: Not on file   Number of children: 2   Years of education: Not on file   Highest education level: Not on file  Occupational History   Occupation: Corporate treasurer  Tobacco Use   Smoking status: Never    Passive exposure: Never   Smokeless tobacco: Never  Vaping Use   Vaping Use: Never used  Substance and Sexual Activity   Alcohol use: No   Drug use: No   Sexual activity: Yes    Comment: with husband   Other Topics Concern   Not on file  Social History Narrative   Married high school  boyfriend at age 71, two boys, still married but he has been living with other women for years.   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  Nephro recommended avoiding use.   Haldol [Haloperidol Lactate] Other (See Comments)    Tardive dyskinesia   Nsaids Other (See Comments)    Nephro recommended avoiding after acute renal failure requiring hemodialysis associated with a suicide attempt.   Entresto [Sacubitril-Valsartan] Itching   Latex Other (See Comments)    Rash around IV site    Labs:  Results for orders placed or performed during the hospital encounter of 02/20/22 (from the past 48 hour(s))  CBG monitoring, ED (now and then every hour for 3 hours)     Status: None   Collection Time: 02/20/22  5:29 PM  Result Value Ref Range   Glucose-Capillary 70 70 - 99 mg/dL    Comment: Glucose reference range applies only to samples taken after fasting for at least 8 hours.  Basic metabolic panel     Status: Abnormal   Collection Time: 02/20/22  5:53 PM  Result Value Ref Range   Sodium 144 135 - 145 mmol/L   Potassium 3.7 3.5 - 5.1 mmol/L   Chloride 111 98 - 111 mmol/L   CO2 21 (L) 22 - 32 mmol/L   Glucose, Bld 44 (LL) 70 - 99 mg/dL    Comment: CRITICAL RESULT CALLED TO, READ BACK BY AND VERIFIED WITH TOWSLEY A,RN @ 2058 02/20/22. YFREEMAN Glucose reference range applies only to samples taken after fasting for at least 8 hours.    BUN 27 (H) 6 - 20 mg/dL   Creatinine, Ser 1.84 (H) 0.44 - 1.00 mg/dL   Calcium 8.8 (L) 8.9 - 10.3 mg/dL   GFR, Estimated 33 (L) >60 mL/min    Comment: (NOTE) Calculated using the CKD-EPI Creatinine Equation (2021)    Anion gap 12 5 - 15    Comment: Performed at [facility] [address], [place], [place] [address]  Acetaminophen level     Status: Abnormal   Collection Time: 02/20/22  5:53 PM  Result Value Ref Range   Acetaminophen (Tylenol), Serum <10 (L) 10 - 30 ug/mL    Comment: (NOTE) Therapeutic concentrations vary significantly. A range of 10-30 ug/mL  may be an effective  concentration for many patients. However, some  are best treated at concentrations outside of this range. Acetaminophen concentrations >150 ug/mL at 4 hours after ingestion  and >50 ug/mL at 12 hours after ingestion are often associated with  toxic reactions.  Performed at [facility], [place] [address], [place], [place] [address]   Ethanol     Status: None   Collection Time: 02/20/22  5:53 PM  Result Value Ref Range   Alcohol, Ethyl (B) <10 <10 mg/dL    Comment: (NOTE) Lowest detectable limit for serum alcohol is 10 mg/dL.  For medical purposes only. Performed at [facility],  [address], [place], [place] [address]   I-Stat Beta hCG blood, ED ([facility], [facility], [facility] only)     Status: None   Collection Time: 02/20/22  6:03 PM  Result Value Ref Range   I-stat hCG, quantitative <5.0 <5 mIU/mL   Comment 3            Comment:   GEST. AGE      CONC.  (mIU/mL)   <=1 WEEK        5 - 50     2 WEEKS       50 - 500     3 WEEKS       100 - 10,000     4 WEEKS     1,000 - 30,000        FEMALE AND NON-PREGNANT FEMALE:     LESS THAN 5 mIU/mL   CBG monitoring, ED (now and then every hour for 3 hours)     Status: Abnormal   Collection Time: 02/20/22  6:10 PM  Result Value Ref Range   Glucose-Capillary 32 (LL) 70 - 99 mg/dL    Comment: Glucose reference range applies only to samples taken after fasting for at least 8 hours.  CBG monitoring, ED (now and then every hour for 3 hours)     Status: None   Collection Time: 02/20/22  6:33 PM  Result Value Ref Range   Glucose-Capillary 88 70 - 99 mg/dL    Comment: Glucose reference range applies only to samples taken after fasting for at least 8 hours.  CBG monitoring, ED     Status: Abnormal   Collection Time: 02/20/22  6:50 PM  Result Value Ref Range   Glucose-Capillary 58 (L) 70 - 99 mg/dL    Comment: Glucose reference range applies only to samples taken after fasting for at least 8 hours.  CBG monitoring, ED     Status: Abnormal    Collection Time: 02/20/22  7:08 PM  Result Value Ref Range   Glucose-Capillary 67 (L) 70 - 99 mg/dL    Comment: Glucose reference range applies only to samples taken after fasting for at least 8 hours.  CBG monitoring, ED     Status: Abnormal   Collection Time: 02/20/22  7:33 PM  Result Value Ref Range   Glucose-Capillary 108 (H) 70 - 99 mg/dL    Comment: Glucose reference range applies only to samples taken after fasting for at least 8 hours.  CBG monitoring, ED     Status: None   Collection Time: 02/20/22  7:49 PM  Result Value Ref Range   Glucose-Capillary 88 70 - 99 mg/dL    Comment: Glucose reference range applies only to samples taken after fasting for at least 8 hours.   Comment 1 Notify RN    Comment 2 Document in Chart   CBG monitoring, ED     Status: Abnormal   Collection Time: 02/20/22  8:05 PM  Result Value Ref Range   Glucose-Capillary 64 (L) 70 - 99 mg/dL    Comment: Glucose reference range applies only to samples taken after fasting for at least 8 hours.   Comment 1 Notify RN    Comment 2 Document in Chart   CBG monitoring, ED     Status: Abnormal   Collection Time: 02/20/22  8:23 PM  Result Value Ref Range   Glucose-Capillary 147 (H) 70 - 99 mg/dL    Comment: Glucose reference range applies only to samples taken after fasting for at  least 8 hours.  Hepatic function panel     Status: Abnormal   Collection Time: 02/20/22  8:43 PM  Result Value Ref Range   Total Protein 7.2 6.5 - 8.1 g/dL   Albumin 3.1 (L) 3.5 - 5.0 g/dL   AST 23 15 - 41 U/L   ALT 14 0 - 44 U/L   Alkaline Phosphatase 42 38 - 126 U/L   Total Bilirubin 0.6 0.3 - 1.2 mg/dL   Bilirubin, Direct <0.1 0.0 - 0.2 mg/dL   Indirect Bilirubin NOT CALCULATED 0.3 - 0.9 mg/dL    Comment: Performed at [facility] [address] [place], [place] [address]  Lipase, blood     Status: None   Collection Time: 02/20/22  8:43 PM  Result Value Ref Range   Lipase 39 11 - 51 U/L    Comment: Performed at [facility] [address] [place], [place] [address]  CBC with Differential/Platelet     Status: Abnormal   Collection Time: 02/20/22  8:43 PM  Result Value Ref Range   WBC 11.2 (H) 4.0 - 10.5 K/uL   RBC 4.16 3.87 - 5.11 MIL/uL   Hemoglobin 11.7 (L) 12.0 - 15.0 g/dL   HCT 36.7 36.0 - 46.0 %   MCV 88.2 80.0 - 100.0 fL   MCH 28.1 26.0 - 34.0 pg   MCHC 31.9 30.0 - 36.0 g/dL   RDW 12.8 11.5 - 15.5 %   Platelets 399 150 - 400 K/uL   nRBC 0.0 0.0 - 0.2 %   Neutrophils Relative % 88 %   Neutro Abs 9.8 (H) 1.7 - 7.7 K/uL   Lymphocytes Relative 7 %   Lymphs Abs 0.8 0.7 - 4.0 K/uL   Monocytes Relative 5 %   Monocytes Absolute 0.6 0.1 - 1.0 K/uL   Eosinophils Relative 0 %   Eosinophils Absolute 0.0 0.0 - 0.5 K/uL   Basophils Relative 0 %   Basophils Absolute 0.0 0.0 - 0.1 K/uL   Immature Granulocytes 0 %   Abs Immature Granulocytes 0.03 0.00 - 0.07 K/uL    Comment: Performed at [facility], [address], [place], [place] [address]  Magnesium     Status: Abnormal   Collection Time: 02/20/22  8:43 PM  Result Value Ref Range   Magnesium 1.4 (L) 1.7 - 2.4 mg/dL    Comment: Performed at [facility] [address], [place], [place] [address]  Phosphorus     Status: Abnormal   Collection Time: 02/20/22  8:43 PM  Result Value Ref Range   Phosphorus 1.3 (L) 2.5 - 4.6 mg/dL    Comment: Performed at [facility] [address], [place], [place] [address]  Protime-INR     Status: None   Collection Time: 02/20/22  8:43 PM  Result Value Ref Range   Prothrombin Time 14.5 11.4 - 15.2 seconds   INR 1.1 0.8 - 1.2    Comment: (NOTE) INR goal varies based on device and disease states. Performed at [facility], [place] [address], [place], [place] [address]   APTT     Status: None   Collection Time: 02/20/22  8:43 PM  Result Value Ref Range   aPTT 29 24 - 36 seconds    Comment: Performed at [facility] [address], [place], [place] [address]  Type and  screen [place]     Status: None   Collection  Time: 02/20/22  8:45 PM  Result Value Ref Range   ABO/RH(D) A POS    Antibody Screen NEG    Sample Expiration      02/23/2022,2359 Performed at [facility], [place] [address] [place], [place] [address]   TSH     Status: None   Collection Time: 02/20/22  8:45 PM  Result Value Ref Range   TSH 1.015 0.350 - 4.500 uIU/mL    Comment: Performed by a 3rd Generation assay with a functional sensitivity of <=0.01 uIU/mL. Performed at [facility], [place] [address] [place], [place] [address]   Salicylate level     Status: Abnormal   Collection Time: 02/20/22  8:48 PM  Result Value Ref Range   Salicylate Lvl <6.7 (L) 7.0 - 30.0 mg/dL    Comment: Performed at [facility] [address], [place], [place] [address]  Lactic acid, plasma     Status: None   Collection Time: 02/20/22  8:48 PM  Result Value Ref Range   Lactic Acid, Venous 1.7 0.5 - 1.9 mmol/L    Comment: Performed at [facility] [address], [place], [place] [address]  CBG monitoring, ED     Status: None   Collection Time: 02/20/22  8:56 PM  Result Value Ref Range   Glucose-Capillary 95 70 - 99 mg/dL    Comment: Glucose reference range applies only to samples taken after fasting for at least 8 hours.   Comment 1 Notify RN    Comment 2 Document in Chart   CBG monitoring, ED     Status: None   Collection Time: 02/20/22  9:13 PM  Result Value Ref Range   Glucose-Capillary 80 70 - 99 mg/dL    Comment: Glucose reference range applies only to samples taken after fasting for at least 8 hours.   Comment 1 Notify RN    Comment 2 Document in Chart   CBG monitoring, ED     Status: None   Collection Time: 02/20/22  9:28 PM  Result Value Ref Range   Glucose-Capillary 71 70 - 99 mg/dL    Comment: Glucose reference range applies only to samples taken after fasting for at least 8 hours.   Comment 1 Notify RN    Comment 2 Document in Chart   CBG  monitoring, ED     Status: Abnormal   Collection Time: 02/20/22 10:12 PM  Result Value Ref Range   Glucose-Capillary 110 (H) 70 - 99 mg/dL    Comment: Glucose reference range applies only to samples taken after fasting for at least 8 hours.   Comment 1 Notify RN    Comment 2 Document in Chart   CBG monitoring, ED     Status: None   Collection Time: 02/20/22 10:45 PM  Result Value Ref Range   Glucose-Capillary 91 70 - 99 mg/dL    Comment: Glucose reference range applies only to samples taken after fasting for at least 8 hours.   Comment 1 Notify RN    Comment 2 Document in Chart   CBG monitoring, ED     Status: None   Collection Time: 02/20/22 11:06 PM  Result Value Ref Range   Glucose-Capillary 72 70 - 99 mg/dL    Comment: Glucose reference range applies only to samples taken after fasting for at least 8 hours.   Comment 1 Notify RN    Comment 2 Document in Chart   CBG monitoring, ED  Status: Abnormal   Collection Time: 02/20/22 11:27 PM  Result Value Ref Range   Glucose-Capillary 125 (H) 70 - 99 mg/dL    Comment: Glucose reference range applies only to samples taken after fasting for at least 8 hours.   Comment 1 Notify RN    Comment 2 Document in Chart   CBG monitoring, ED     Status: Abnormal   Collection Time: 02/20/22 11:49 PM  Result Value Ref Range   Glucose-Capillary 112 (H) 70 - 99 mg/dL    Comment: Glucose reference range applies only to samples taken after fasting for at least 8 hours.   Comment 1 Notify RN    Comment 2 Document in Chart   CBG monitoring, ED     Status: None   Collection Time: 02/21/22 12:09 AM  Result Value Ref Range   Glucose-Capillary 82 70 - 99 mg/dL    Comment: Glucose reference range applies only to samples taken after fasting for at least 8 hours.   Comment 1 Notify RN    Comment 2 Document in Chart   CBG monitoring, ED     Status: None   Collection Time: 02/21/22 12:29 AM  Result Value Ref Range   Glucose-Capillary 76 70 - 99 mg/dL     Comment: Glucose reference range applies only to samples taken after fasting for at least 8 hours.   Comment 1 Notify RN    Comment 2 Document in Chart   Lactic acid, plasma     Status: None   Collection Time: 02/21/22 12:44 AM  Result Value Ref Range   Lactic Acid, Venous 1.6 0.5 - 1.9 mmol/L    Comment: Performed at [facility], [address], [place], [place] [address]  CBG monitoring, ED     Status: Abnormal   Collection Time: 02/21/22 12:53 AM  Result Value Ref Range   Glucose-Capillary 107 (H) 70 - 99 mg/dL    Comment: Glucose reference range applies only to samples taken after fasting for at least 8 hours.   Comment 1 Notify RN    Comment 2 Document in Chart   CBG monitoring, ED     Status: Abnormal   Collection Time: 02/21/22  1:13 AM  Result Value Ref Range   Glucose-Capillary 66 (L) 70 - 99 mg/dL    Comment: Glucose reference range applies only to samples taken after fasting for at least 8 hours.   Comment 1 Notify RN    Comment 2 Document in Chart   Glucose, capillary     Status: Abnormal   Collection Time: 02/21/22  1:28 AM  Result Value Ref Range   Glucose-Capillary 129 (H) 70 - 99 mg/dL    Comment: Glucose reference range applies only to samples taken after fasting for at least 8 hours.  Glucose, capillary     Status: Abnormal   Collection Time: 02/21/22  2:13 AM  Result Value Ref Range   Glucose-Capillary 40 (LL) 70 - 99 mg/dL    Comment: Glucose reference range applies only to samples taken after fasting for at least 8 hours.   Comment 1 Notify RN    Comment 2 Document in Chart   Glucose, capillary     Status: Abnormal   Collection Time: 02/21/22  2:33 AM  Result Value Ref Range   Glucose-Capillary 114 (H) 70 - 99 mg/dL    Comment: Glucose reference range applies only to samples taken after fasting for at least 8 hours.   Comment 1  Notify RN    Comment 2 Document in Chart   MRSA Next Gen by PCR, Nasal     Status: None   Collection Time: 02/21/22   2:51 AM   Specimen: Nasal Mucosa; Nasal Swab  Result Value Ref Range   MRSA by PCR Next Gen NOT DETECTED NOT DETECTED    Comment: (NOTE) The GeneXpert MRSA Assay (FDA approved for NASAL specimens only), is one component of a comprehensive MRSA colonization surveillance program. It is not intended to diagnose MRSA infection nor to guide or monitor treatment for MRSA infections. Test performance is not FDA approved in patients less than 75 years old. Performed at [facility], [place] [address] [place], [place] [address]   Glucose, capillary     Status: Abnormal   Collection Time: 02/21/22  2:54 AM  Result Value Ref Range   Glucose-Capillary 103 (H) 70 - 99 mg/dL    Comment: Glucose reference range applies only to samples taken after fasting for at least 8 hours.   Comment 1 Notify RN    Comment 2 Document in Chart   Glucose, capillary     Status: None   Collection Time: 02/21/22  3:15 AM  Result Value Ref Range   Glucose-Capillary 99 70 - 99 mg/dL    Comment: Glucose reference range applies only to samples taken after fasting for at least 8 hours.   Comment 1 Notify RN    Comment 2 Document in Chart   Glucose, capillary     Status: None   Collection Time: 02/21/22  3:40 AM  Result Value Ref Range   Glucose-Capillary 81 70 - 99 mg/dL    Comment: Glucose reference range applies only to samples taken after fasting for at least 8 hours.   Comment 1 Notify RN    Comment 2 Document in Chart   Glucose, capillary     Status: None   Collection Time: 02/21/22  3:56 AM  Result Value Ref Range   Glucose-Capillary 80 70 - 99 mg/dL    Comment: Glucose reference range applies only to samples taken after fasting for at least 8 hours.   Comment 1 Notify RN    Comment 2 Document in Chart   Basic metabolic panel     Status: Abnormal   Collection Time: 02/21/22  4:14 AM  Result Value Ref Range   Sodium 140 135 - 145 mmol/L   Potassium 3.8 3.5 - 5.1 mmol/L   Chloride 105 98 - 111 mmol/L    CO2 24 22 - 32 mmol/L   Glucose, Bld 107 (H) 70 - 99 mg/dL    Comment: Glucose reference range applies only to samples taken after fasting for at least 8 hours.   BUN 24 (H) 6 - 20 mg/dL   Creatinine, Ser 2.01 (H) 0.44 - 1.00 mg/dL   Calcium 8.8 (L) 8.9 - 10.3 mg/dL   GFR, Estimated 29 (L) >60 mL/min    Comment: (NOTE) Calculated using the CKD-EPI Creatinine Equation (2021)    Anion gap 11 5 - 15    Comment: Performed at [facility] [address] [place], [place] [address]  Magnesium     Status: None   Collection Time: 02/21/22  4:14 AM  Result Value Ref Range   Magnesium 1.7 1.7 - 2.4 mg/dL    Comment: Performed at [facility] [address], [place], [place] [address]  Phosphorus     Status: None   Collection Time: 02/21/22  4:14 AM  Result Value Ref Range   Phosphorus 2.7 2.5 - 4.6 mg/dL    Comment: Performed at [facility], [place] [address] [place], [place] [address]  Glucose, capillary     Status: Abnormal   Collection Time: 02/21/22  4:16 AM  Result Value Ref Range   Glucose-Capillary 129 (H) 70 - 99 mg/dL    Comment: Glucose reference range applies only to samples taken after fasting for at least 8 hours.  Glucose, capillary     Status: Abnormal   Collection Time: 02/21/22  4:31 AM  Result Value Ref Range   Glucose-Capillary 120 (H) 70 - 99 mg/dL    Comment: Glucose reference range applies only to samples taken after fasting for at least 8 hours.  Glucose, capillary     Status: Abnormal   Collection Time: 02/21/22  5:03 AM  Result Value Ref Range   Glucose-Capillary 179 (H) 70 - 99 mg/dL    Comment: Glucose reference range applies only to samples taken after fasting for at least 8 hours.   Comment 1 Notify RN    Comment 2 Document in Chart   Glucose, capillary     Status: Abnormal   Collection Time: 02/21/22  6:04 AM  Result Value Ref Range   Glucose-Capillary 229 (H) 70 - 99 mg/dL    Comment: Glucose reference range applies only to samples  taken after fasting for at least 8 hours.   Comment 1 Notify RN    Comment 2 Document in Chart   Glucose, capillary     Status: Abnormal   Collection Time: 02/21/22  7:14 AM  Result Value Ref Range   Glucose-Capillary 303 (H) 70 - 99 mg/dL    Comment: Glucose reference range applies only to samples taken after fasting for at least 8 hours.  Glucose, capillary     Status: Abnormal   Collection Time: 02/21/22  8:43 AM  Result Value Ref Range   Glucose-Capillary 394 (H) 70 - 99 mg/dL    Comment: Glucose reference range applies only to samples taken after fasting for at least 8 hours.  Glucose, capillary     Status: Abnormal   Collection Time: 02/21/22  9:08 AM  Result Value Ref Range   Glucose-Capillary 382 (H) 70 - 99 mg/dL    Comment: Glucose reference range applies only to samples taken after fasting for at least 8 hours.  Glucose, capillary     Status: Abnormal   Collection Time: 02/21/22 10:11 AM  Result Value Ref Range   Glucose-Capillary 442 (H) 70 - 99 mg/dL    Comment: Glucose reference range applies only to samples taken after fasting for at least 8 hours.  Glucose, capillary     Status: Abnormal   Collection Time: 02/21/22 11:12 AM  Result Value Ref Range   Glucose-Capillary 427 (H) 70 - 99 mg/dL    Comment: Glucose reference range applies only to samples taken after fasting for at least 8 hours.  Glucose, capillary     Status: Abnormal   Collection Time: 02/21/22 12:05 PM  Result Value Ref Range   Glucose-Capillary 356 (H) 70 - 99 mg/dL    Comment: Glucose reference range applies only to samples taken after fasting for at least 8 hours.  Glucose, capillary     Status: Abnormal   Collection Time: 02/21/22 12:59 PM  Result Value Ref Range   Glucose-Capillary 336 (H) 70 - 99 mg/dL    Comment: Glucose reference range applies only to  samples taken after fasting for at least 8 hours.  Glucose, capillary     Status: Abnormal   Collection Time: 02/21/22  2:46 PM  Result Value  Ref Range   Glucose-Capillary 308 (H) 70 - 99 mg/dL    Comment: Glucose reference range applies only to samples taken after fasting for at least 8 hours.  Glucose, capillary     Status: Abnormal   Collection Time: 02/21/22  3:52 PM  Result Value Ref Range   Glucose-Capillary 254 (H) 70 - 99 mg/dL    Comment: Glucose reference range applies only to samples taken after fasting for at least 8 hours.  Glucose, capillary     Status: Abnormal   Collection Time: 02/21/22  5:27 PM  Result Value Ref Range   Glucose-Capillary 223 (H) 70 - 99 mg/dL    Comment: Glucose reference range applies only to samples taken after fasting for at least 8 hours.  Glucose, capillary     Status: Abnormal   Collection Time: 02/21/22  8:12 PM  Result Value Ref Range   Glucose-Capillary 231 (H) 70 - 99 mg/dL    Comment: Glucose reference range applies only to samples taken after fasting for at least 8 hours.  Magnesium     Status: None   Collection Time: 02/22/22  3:23 AM  Result Value Ref Range   Magnesium 2.0 1.7 - 2.4 mg/dL    Comment: Performed at [facility], [place] [address] [place], [place] [address]  Phosphorus     Status: None   Collection Time: 02/22/22  3:23 AM  Result Value Ref Range   Phosphorus 3.4 2.5 - 4.6 mg/dL    Comment: Performed at [facility] [address], [place], [place] [address]  CBC with Differential/Platelet     Status: Abnormal   Collection Time: 02/22/22  3:23 AM  Result Value Ref Range   WBC 11.7 (H) 4.0 - 10.5 K/uL   RBC 3.61 (L) 3.87 - 5.11 MIL/uL   Hemoglobin 9.9 (L) 12.0 - 15.0 g/dL   HCT 31.5 (L) 36.0 - 46.0 %   MCV 87.3 80.0 - 100.0 fL   MCH 27.4 26.0 - 34.0 pg   MCHC 31.4 30.0 - 36.0 g/dL   RDW 13.0 11.5 - 15.5 %   Platelets 374 150 - 400 K/uL   nRBC 0.0 0.0 - 0.2 %   Neutrophils Relative % 62 %   Neutro Abs 7.1 1.7 - 7.7 K/uL   Lymphocytes Relative 29 %   Lymphs Abs 3.4 0.7 - 4.0 K/uL   Monocytes Relative 7 %   Monocytes Absolute 0.9 0.1 - 1.0  K/uL   Eosinophils Relative 2 %   Eosinophils Absolute 0.2 0.0 - 0.5 K/uL   Basophils Relative 0 %   Basophils Absolute 0.0 0.0 - 0.1 K/uL   Immature Granulocytes 0 %   Abs Immature Granulocytes 0.05 0.00 - 0.07 K/uL    Comment: Performed at [facility], [address], [place], [place] [address]  Comprehensive metabolic panel     Status: Abnormal   Collection Time: 02/22/22  3:23 AM  Result Value Ref Range   Sodium 138 135 - 145 mmol/L   Potassium 5.1 3.5 - 5.1 mmol/L   Chloride 106 98 - 111 mmol/L   CO2 24 22 - 32 mmol/L   Glucose, Bld 151 (H) 70 - 99 mg/dL    Comment: Glucose reference range applies only to samples taken after fasting for at  least 8 hours.   BUN 31 (H) 6 - 20 mg/dL   Creatinine, Ser 2.11 (H) 0.44 - 1.00 mg/dL   Calcium 8.6 (L) 8.9 - 10.3 mg/dL   Total Protein 6.7 6.5 - 8.1 g/dL   Albumin 2.9 (L) 3.5 - 5.0 g/dL   AST 16 15 - 41 U/L   ALT 13 0 - 44 U/L   Alkaline Phosphatase 34 (L) 38 - 126 U/L   Total Bilirubin 0.5 0.3 - 1.2 mg/dL   GFR, Estimated 28 (L) >60 mL/min    Comment: (NOTE) Calculated using the CKD-EPI Creatinine Equation (2021)    Anion gap 8 5 - 15    Comment: Performed at [facility] [address] [place], [place] [address]  Glucose, capillary     Status: Abnormal   Collection Time: 02/22/22  6:24 AM  Result Value Ref Range   Glucose-Capillary 140 (H) 70 - 99 mg/dL    Comment: Glucose reference range applies only to samples taken after fasting for at least 8 hours.  Glucose, capillary     Status: Abnormal   Collection Time: 02/22/22  7:18 AM  Result Value Ref Range   Glucose-Capillary 146 (H) 70 - 99 mg/dL    Comment: Glucose reference range applies only to samples taken after fasting for at least 8 hours.  Glucose, capillary     Status: Abnormal   Collection Time: 02/22/22 11:32 AM  Result Value Ref Range   Glucose-Capillary 179 (H) 70 - 99 mg/dL    Comment: Glucose reference range applies only to samples taken after  fasting for at least 8 hours.  Glucose, capillary     Status: Abnormal   Collection Time: 02/22/22  4:53 PM  Result Value Ref Range   Glucose-Capillary 195 (H) 70 - 99 mg/dL    Comment: Glucose reference range applies only to samples taken after fasting for at least 8 hours.    Current Facility-Administered Medications  Medication Dose Route Frequency Provider Last Rate Last Admin   0.9 %  sodium chloride infusion   Intravenous PRN Jacky Kindle, MD   Stopped at 02/21/22 1351   acetaminophen (TYLENOL) tablet 650 mg  650 mg Oral Q4H PRN Frederik Pear, MD   650 mg at 02/22/22 1516   albuterol (PROVENTIL) (2.5 MG/3ML) 0.083% nebulizer solution 2.5 mg  2.5 mg Inhalation Q4H PRN Martyn Malay, MD       Chlorhexidine Gluconate Cloth 2 % PADS 6 each  6 each Topical Daily Freddi Starr, MD   6 each at 02/21/22 0145   dextrose 50 % solution 50 mL  1 ampule Intravenous Q15 min PRN Ogan, Kerry Kass, MD       enoxaparin (LOVENOX) injection 87.5 mg  0.5 mg/kg Subcutaneous Q24H Dorris Singh M, MD   87.5 mg at 02/22/22 1201   FLUoxetine (PROZAC) capsule 40 mg  40 mg Oral Daily Virl Axe, MD   40 mg at 02/22/22 0957   fluticasone furoate-vilanterol (BREO ELLIPTA) 200-25 MCG/ACT 1 puff  1 puff Inhalation Daily Freda Jackson B, MD   1 puff at 02/22/22 0733   insulin aspart (novoLOG) injection 0-15 Units  0-15 Units Subcutaneous TID WC Jacky Kindle, MD   3 Units at 02/22/22 1705   insulin aspart (novoLOG) injection 0-5 Units  0-5 Units Subcutaneous QHS Jacky Kindle, MD   2 Units at 02/21/22 2017   iohexol (OMNIPAQUE) 9 MG/ML oral solution 500 mL  500 mL Oral Q1H Dorris Singh  M, MD   500 mL at 02/22/22 1702   isosorbide-hydrALAZINE (BIDIL) 20-37.5 MG per tablet 1 tablet  1 tablet Oral TID Martyn Malay, MD   1 tablet at 02/22/22 1701   levothyroxine (SYNTHROID) tablet 300 mcg  300 mcg Oral Q0600 Freddi Starr, MD   300 mcg at 02/22/22 0102   metoprolol tartrate (LOPRESSOR) tablet 25  mg  25 mg Oral BID Martyn Malay, MD   25 mg at 02/22/22 1200   mirtazapine (REMERON) tablet 30 mg  30 mg Oral Daily Virl Axe, MD   30 mg at 02/22/22 0957   ondansetron (ZOFRAN) injection 4 mg  4 mg Intravenous Q6H PRN Freddi Starr, MD       Oral care mouth rinse  15 mL Mouth Rinse PRN Freddi Starr, MD       pantoprazole (PROTONIX) EC tablet 40 mg  40 mg Oral Daily Virl Axe, MD   40 mg at 02/22/22 0957   polyethylene glycol (MIRALAX / GLYCOLAX) packet 17 g  17 g Oral Daily Alcus Dad, MD       rosuvastatin (CRESTOR) tablet 20 mg  20 mg Oral Daily Dorris Singh M, MD   20 mg at 02/22/22 1200   sodium chloride flush (NS) 0.9 % injection 3 mL  3 mL Intravenous Q12H Rex Kras, PA   3 mL at 02/22/22 7253     Psychiatric Specialty Exam:  Presentation  General Appearance:  Appropriate for Environment  Eye Contact: Good  Speech: Clear and Coherent  Speech Volume: Decreased  Handedness: Right   Mood and Affect  Mood: Depressed; Anxious  Affect: Congruent   Thought Process  Thought Processes: Coherent  Descriptions of Associations:Intact  Orientation:Full (Time, Place and Person)  Thought Content:Logical  History of Schizophrenia/Schizoaffective disorder:No data recorded Duration of Psychotic Symptoms:No data recorded Hallucinations:Hallucinations: None  Ideas of Reference:None  Suicidal Thoughts: vague Homicidal Thoughts:Homicidal Thoughts: No   Sensorium  Memory: Immediate Fair; Recent Fair; Remote Fair  Judgment: Fair  Insight: Lacking   Executive Functions  Concentration: Good  Attention Span: Good  Recall: [employer] of Knowledge: Fair  Language: Fair   Psychomotor Activity  Psychomotor Activity: Psychomotor Activity: Normal   Assets  Assets: Desire for Improvement; Social Support   Sleep  Sleep: Sleep: Fair   Physical Exam: Physical Exam ROS Blood pressure 137/83, pulse 91, temperature  98.6 F (37 C), resp. rate 14, height 5\' 11"  (1.803 m), weight (!) 172.5 kg, SpO2 100 %. Body mass index is 53.04 kg/m.  Assessment: Patient is a 51 year old female with the above-stated past psychiatric history who is seen in follow-up. Chart reviewed. Patient made a suicide attempt by overdose on ibuprofen and insulin in the context of worsening depression with holiday season and anniversary of death of her son and father. Patient continues to be a high risk for suicide. She meets criteria for inpatient psychiatric admission once medically cleared.   Recommendations:  -Recommend psychiatric Inpatient admission when medically cleared.  -Continue Prozac 40 mg daily - Continue Remeron 30 mg p.o. nightly  Will follow while here.  Larita Fife, MD 02/22/2022 5:07 PM

## 2022-02-22 NOTE — Assessment & Plan Note (Addendum)
Creatinine 1.92 (baseline around 1.85) in the setting of Naproxen overdose - Continue to trend BMP - Bladder scans BID - Encourage oral hydration - Avoid nephrotoxic agents as able, add back Lasix when clinically indicated

## 2022-02-22 NOTE — Progress Notes (Signed)
OT Cancellation Note  Patient Details Name: [name] MRN: [id number] DOB: 1970-12-12   Cancelled Treatment:    Reason Eval/Treat Not Completed: OT screened, no needs identified, will sign off.  Patient is at her baseline.  Mobility team is following her in the acute setting.    Terek Bee D Lianne Carreto 02/22/2022, 1:13 PM 02/22/2022  RP, OTR/L  Acute Rehabilitation Services  Office:  [phone]

## 2022-02-22 NOTE — Plan of Care (Signed)
Off the floor at 2120 to CT abdomen

## 2022-02-22 NOTE — Plan of Care (Signed)
Back on the floor from CT at 2140

## 2022-02-22 NOTE — Assessment & Plan Note (Addendum)
CBGs remain in the mid-100s. Received 8 Units of Novolog in the last 24h. - CBGs QAC and QHS - Moderate SSI - Holding long-acting insulin as well controlled currently  - Hold metformin

## 2022-02-22 NOTE — Assessment & Plan Note (Addendum)
via insulin and naproxen overdose. Required ICU admission due to recurrent profound hypoglycemia. CBG currently stable off D20 infusion and glucagon. Has history of depression, anxiety, and prolonged grief related to family deaths -Psychiatry following, appreciate recommendations -CBG monitoring (see diabetes below) -Daily BMP -Continue home fluoxetine and mirtazapine

## 2022-02-22 NOTE — Progress Notes (Signed)
Daily Progress Note Intern Pager: [phone]  Patient name: [name] Medical record number: [id number] Date of birth: 1970/05/29 Age: 51 y.o. Gender: female  Primary Care Provider: [name], DO Consultants: CCM (s/o 12/16), psychiatry Code Status: full  Pt Overview and Major Events to Date:  12/14: admitted to ICU for hypoglycemia 12/15: off D20 infusion and glucagon 12/16: transferred to FMTS  Assessment and Plan:  [name] is a 51 y.o. female who presented with hypoglycemia following suicide attempt with insulin and naproxen. Pertinent PMH/PSH includes anxiety, depression, prior suicide attempt (2012), hypothyroidism, HTN and T2DM.   * Suicide attempt ([place]) via insulin and naproxen overdose. Required ICU admission due to recurrent profound hypoglycemia. CBG currently stable off D20 infusion and glucagon. Has history of depression, anxiety, and prolonged grief related to family deaths -Psychiatry following, appreciate recommendations -CBG monitoring (see diabetes below) -Daily BMP -Continue home fluoxetine and mirtazapine  Acute-on-chronic kidney injury ([place]) Cr 2.11 this morning. Baseline unclear-- best estimate seems to be ~1.85 although has been in the low 2's recently as well. Likely secondary to Naproxen overdose. -am BMP -bladder scans BID -encourage PO hydration -avoid nephrotoxic agents when possible; holding home lasix  Type II diabetes mellitus with complication (HCC) CBGs mid 100s this morning.  Most recent A1c 9.7%. Home meds include: basaglar 65u BID, Novolog 15u with lunch and 20u with dinner, Metformin 1000mg  BID -CBG qAC and qHS -moderate sliding scale -hold long-acting insulin for now -hold home Metformin  Abdominal pain Patient reports significant abdominal pain this am, worst in RLQ with associated tenderness to palpation. 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 Hold home Lasix due to AKI HLD: resume home statin Hypothyroidism: continue home synthroid Asthma: home albuterol prn  FEN/GI: regular diet PPx: Lovenox (to start 12/17 due to Naproxen ingestion) Dispo: Inpatient psych  pending clinical improvement .   Subjective:  No acute events overnight. Patient tearful this morning. States she's sad, her heart hurts, wishes she didn't feel her emotions so strongly.  Identifies her son's death and the death of her parents as major stressors despite occurring several years ago. Reports this time of year is extremely challenging for her. Identifies her 3 grandchildren, her 49 year-old son, and her friends as protective factors.    Objective: Temp:  [97.9 F (36.6 C)-99 F (37.2 C)] 98.7 F (37.1 C) (12/16 1136) Pulse Rate:  [93-105] 100 (12/16 1136) Resp:  [14-23] 14 (12/16 1136) BP: (106-140)/(70-93) 114/84 (12/16 1136) SpO2:  [91 %-100 %] 100 % (12/16 1136) Weight:  [172.5 kg] 172.5 kg (12/16 0523) Physical Exam: General: alert, tearful Cardiovascular: RRR, normal S1/S2 Respiratory: normal effort, lungs CTAB Abdomen: soft, tenderness to palpation in RLQ without rebound or guarding Neuro: normal speech, moves all extremities equally, oriented x4 Psych: very tearful, depressed mood, good insight, +SI  Laboratory: Most recent CBC Lab Results  Component Value Date   WBC 11.7 (H) 02/22/2022   HGB 9.9 (L) 02/22/2022   HCT 31.5 (L) 02/22/2022   MCV 87.3 02/22/2022   PLT 374 02/22/2022   Most recent BMP    Latest Ref Rng & Units 02/22/2022    3:23 AM  BMP  Glucose 70 - 99 mg/dL 151   BUN 6 - 20 mg/dL 31   Creatinine 0.44 - 1.00 mg/dL 2.11   Sodium 135 - 145 mmol/L 138   Potassium 3.5 - 5.1 mmol/L  5.1   Chloride 98 - 111 mmol/L 106   CO2 22 - 32 mmol/L 24   Calcium 8.9 - 10.3 mg/dL 8.6     Imaging/Diagnostic Tests: No new imaging in the past 24h. Only imaging this admission:  RUQ u/s on 12/14 showing hepatic steatosis, s/p cholecystectomy, otherwise unremarkable   Samantha Dad, MD 02/22/2022, 12:16 PM  PGY-3, [place] Intern pager: [phone], text pages welcome Secure chat group [place]

## 2022-02-23 ENCOUNTER — Other Ambulatory Visit: Payer: Self-pay | Admitting: Family Medicine

## 2022-02-23 DIAGNOSIS — F411 Generalized anxiety disorder: Secondary | ICD-10-CM

## 2022-02-23 DIAGNOSIS — R1031 Right lower quadrant pain: Secondary | ICD-10-CM | POA: Diagnosis not present

## 2022-02-23 DIAGNOSIS — T1491XA Suicide attempt, initial encounter: Secondary | ICD-10-CM

## 2022-02-23 DIAGNOSIS — Z634 Disappearance and death of family member: Secondary | ICD-10-CM

## 2022-02-23 DIAGNOSIS — E875 Hyperkalemia: Secondary | ICD-10-CM

## 2022-02-23 DIAGNOSIS — E118 Type 2 diabetes mellitus with unspecified complications: Secondary | ICD-10-CM | POA: Diagnosis not present

## 2022-02-23 LAB — BASIC METABOLIC PANEL
Anion gap: 6 (ref 5–15)
Anion gap: 11 (ref 5–15)
Anion gap: 9 (ref 5–15)
BUN: 31 mg/dL — ABNORMAL HIGH (ref 6–20)
BUN: 31 mg/dL — ABNORMAL HIGH (ref 6–20)
BUN: 30 mg/dL — ABNORMAL HIGH (ref 6–20)
CO2: 24 mmol/L (ref 22–32)
CO2: 21 mmol/L — ABNORMAL LOW (ref 22–32)
CO2: 23 mmol/L (ref 22–32)
Calcium: 8.5 mg/dL — ABNORMAL LOW (ref 8.9–10.3)
Calcium: 9.2 mg/dL (ref 8.9–10.3)
Calcium: 9 mg/dL (ref 8.9–10.3)
Chloride: 107 mmol/L (ref 98–111)
Chloride: 103 mmol/L (ref 98–111)
Chloride: 106 mmol/L (ref 98–111)
Creatinine, Ser: 1.92 mg/dL — ABNORMAL HIGH (ref 0.44–1.00)
Creatinine, Ser: 1.87 mg/dL — ABNORMAL HIGH (ref 0.44–1.00)
Creatinine, Ser: 1.93 mg/dL — ABNORMAL HIGH (ref 0.44–1.00)
GFR, Estimated: 31 mL/min — ABNORMAL LOW (ref >60)
GFR, Estimated: 32 mL/min — ABNORMAL LOW (ref >60)
GFR, Estimated: 31 mL/min — ABNORMAL LOW (ref >60)
Glucose, Bld: 146 mg/dL — ABNORMAL HIGH (ref 70–99)
Glucose, Bld: 161 mg/dL — ABNORMAL HIGH (ref 70–99)
Glucose, Bld: 238 mg/dL — ABNORMAL HIGH (ref 70–99)
Potassium: 5.4 mmol/L — ABNORMAL HIGH (ref 3.5–5.1)
Potassium: 6.5 mmol/L (ref 3.5–5.1)
Potassium: 5.1 mmol/L (ref 3.5–5.1)
Sodium: 137 mmol/L (ref 135–145)
Sodium: 135 mmol/L (ref 135–145)
Sodium: 138 mmol/L (ref 135–145)

## 2022-02-23 LAB — CBC
HCT: 30.4 % — ABNORMAL LOW (ref 36.0–46.0)
Hemoglobin: 9.7 g/dL — ABNORMAL LOW (ref 12.0–15.0)
MCH: 27.4 pg (ref 26.0–34.0)
MCHC: 31.9 g/dL (ref 30.0–36.0)
MCV: 85.9 fL (ref 80.0–100.0)
Platelets: 358 10*3/uL (ref 150–400)
RBC: 3.54 MIL/uL — ABNORMAL LOW (ref 3.87–5.11)
RDW: 13.1 % (ref 11.5–15.5)
WBC: 8.3 10*3/uL (ref 4.0–10.5)
nRBC: 0 % (ref 0.0–0.2)

## 2022-02-23 LAB — GLUCOSE, CAPILLARY
Glucose-Capillary: 132 mg/dL — ABNORMAL HIGH (ref 70–99)
Glucose-Capillary: 163 mg/dL — ABNORMAL HIGH (ref 70–99)
Glucose-Capillary: 212 mg/dL — ABNORMAL HIGH (ref 70–99)
Glucose-Capillary: 206 mg/dL — ABNORMAL HIGH (ref 70–99)

## 2022-02-23 LAB — HEPATIC FUNCTION PANEL
ALT: 15 U/L (ref 0–44)
AST: 26 U/L (ref 15–41)
Albumin: 3.4 g/dL — ABNORMAL LOW (ref 3.5–5.0)
Alkaline Phosphatase: 44 U/L (ref 38–126)
Bilirubin, Direct: 0.3 mg/dL — ABNORMAL HIGH (ref 0.0–0.2)
Indirect Bilirubin: 0.3 mg/dL (ref 0.3–0.9)
Total Bilirubin: 0.6 mg/dL (ref 0.3–1.2)
Total Protein: 7.4 g/dL (ref 6.5–8.1)

## 2022-02-23 MED ORDER — SIMETHICONE 80 MG PO CHEW
80.0000 mg | CHEWABLE_TABLET | Freq: Once | ORAL | Status: AC
  Administered 2022-02-23: 80 mg via ORAL
  Filled 2022-02-23: qty 1

## 2022-02-23 MED ORDER — FUROSEMIDE 40 MG PO TABS
40.0000 mg | ORAL_TABLET | Freq: Every day | ORAL | Status: DC
  Administered 2022-02-23 – 2022-02-24 (×2): 40 mg via ORAL
  Filled 2022-02-23 (×2): qty 1

## 2022-02-23 MED ORDER — HYDROXYZINE HCL 10 MG PO TABS
10.0000 mg | ORAL_TABLET | Freq: Once | ORAL | Status: AC
  Administered 2022-02-23: 10 mg via ORAL
  Filled 2022-02-23: qty 1

## 2022-02-23 MED ORDER — CALCIUM GLUCONATE-NACL 2-0.675 GM/100ML-% IV SOLN: 2.0000 g | Freq: Once | INTRAVENOUS | Status: DC

## 2022-02-23 MED ORDER — SODIUM ZIRCONIUM CYCLOSILICATE 10 G PO PACK
10.0000 g | PACK | Freq: Once | ORAL | Status: AC
  Administered 2022-02-23: 10 g via ORAL
  Filled 2022-02-23: qty 1

## 2022-02-23 MED ORDER — INSULIN ASPART 100 UNIT/ML IJ SOLN
10.0000 [IU] | Freq: Once | INTRAMUSCULAR | Status: AC
  Administered 2022-02-23: 10 [IU] via SUBCUTANEOUS

## 2022-02-23 MED ORDER — DICYCLOMINE HCL 20 MG PO TABS
20.0000 mg | ORAL_TABLET | Freq: Three times a day (TID) | ORAL | Status: DC
  Administered 2022-02-23 – 2022-02-24 (×5): 20 mg via ORAL
  Filled 2022-02-23 (×6): qty 1

## 2022-02-23 NOTE — Assessment & Plan Note (Addendum)
K 5.4 today on labs.  - EKG - Repeat BMP at lunch

## 2022-02-23 NOTE — Progress Notes (Addendum)
Daily Progress Note Intern Pager: [phone]  Patient name: [name] Medical record number: [id number] Date of birth: 1970/10/05 Age: 51 y.o. Gender: female  Primary Care Provider: [name], DO Consultants: CM (s/p 12/16), psychiatry Code Status: Full  Pt Overview and Major Events to Date:  12/14: admitted to ICU for hypoglycemia 12/15: off D20 infusion and glucagon 12/16: transferred to FMTS  Assessment and Plan: [name] is a 51 y.o. female who presented with hypoglycemia following suicide attempt with insulin and naproxen. Pertinent PMH/PSH includes anxiety, depression, prior suicide attempt (2012), hypothyroidism, HTN and T2DM.    * Suicide attempt ([name]) Insulin and Naproxen overdose, now with stable CBGs. Is high risk due to holiday season and anniversary of the death of her son and father. Meet's impatient psychiatric criteria. - Psychiatry following, appreciate recs - Continue Prozac 40mg  daily - Continue remeron 30mg  QHS - Daily BMP  Abdominal pain CT abdomen negative. Still reporting RUQ and RLQ pain, reassuringly no rebound tenderness on exam. Differential includes constipation, gas, gastritis from Naproxen, and gastroparesis at this time. Patient did not use any miralax yesterday  - Trial of Simethicone x1 - Miralax daily - Protonix daily  - could consider trial of Reglan  (Qtc 507 and getting repeat)  Hyperkalemia K 5.4 today on labs.  - EKG - Repeat BMP at lunch  Type II diabetes mellitus with complication (HCC) CBGs remain in the mid-100s. Received 8 Units of Novolog in the last 24h. 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 Hold home Lasix  HLD: continue home statin Hypothyroidism: continue home synthroid Asthma: home albuterol prn  FEN/GI: regular diet PPx: Lovenox starting today Dispo: Inpatient psych  pending clinical improvement .  Subjective:  Patient reports that she is still having significant abdominal pain on the right side. She declined the miralax yesterday because she had a bowel movement but also states that it was very small. She has not had this pain prior to this hospitalization and has difficulty with characterizing her symptoms. Denies issues with urinating or any dysuria.   Objective: Temp:  [98 F (36.7 C)-98.7 F (37.1 C)] 98.3 F (36.8 C) (12/17 0530) Pulse Rate:  [86-100] 88 (12/17 0541) Resp:  [14-16] 16 (12/17 0530) BP: (114-145)/(70-86) 145/86 (12/17 0541) SpO2:  [100 %] 100 % (12/17 0530) Physical Exam: General: alert, conversing well in the room, mild anxiety from the pain but not tearful Cardiovascular: RRR, normal S1/S2 Respiratory: normal WOB, CTAB Abdomen: soft, TTP in the RUQ and RLQ without rebound or guarding Neuro: normal speech, moving all extremities, A&Ox4  Laboratory: Most recent CBC Lab Results  Component Value Date   WBC 8.3 02/23/2022   HGB 9.7 (L) 02/23/2022   HCT 30.4 (L) 02/23/2022   MCV 85.9 02/23/2022   PLT 358 02/23/2022   Most recent BMP    Latest Ref Rng & Units 02/23/2022    2:51 AM  BMP  Glucose 70 - 99 mg/dL 146   BUN 6 - 20 mg/dL 31   Creatinine 0.44 - 1.00 mg/dL 1.92   Sodium 135 - 145  mmol/L 137   Potassium 3.5 - 5.1 mmol/L 5.4   Chloride 98 - 111 mmol/L 107   CO2 22 - 32 mmol/L 24   Calcium 8.9 - 10.3 mg/dL 8.5     Imaging/Diagnostic Tests: CT ABDOMEN PELVIS WO CONTRAST Result Date: 02/22/2022 IMPRESSION: No evidence of appendicitis or bowel obstruction. No CT findings to account for the patient's right lower  quadrant abdominal pain. Status post cholecystectomy.  Samantha Patience, DO 02/23/2022, 6:39 AM  PGY-3, [place] Intern pager: [phone], text pages welcome Secure chat group [place]

## 2022-02-23 NOTE — Consult Note (Signed)
[facility] Psychiatry Consult   Reason for Consult:  suicidal attempt Referring Physician:  Dr. Owens Shark Patient Identification: [name] MRN:  [id number] Principal Diagnosis: Suicide attempt [facility]) Diagnosis:  Principal Problem:   Suicide attempt [facility]) Active Problems:   Type II diabetes mellitus with complication ([place])   Abdominal pain   Grief at loss of child   Essential hypertension   Generalized anxiety disorder   Depression   Acute-on-chronic kidney injury ([place])   Hyperkalemia   Total Time spent with patient: 30 minutes  Subjective:   [name] is a 51 y.o. female patient admitted after taking 1800 units of NovoLog and 6 g of naproxen with CBG 38.  Patient was admitted to ICU on D10 infusion wit after receiving several D50 ampules.  D10 infusion was continued with additional measures of glucagon and D50 pushes.  She was placed on daily PPI for GI prophylaxis regarding naproxen ingestion.  Patient was transferred to floor status on 12/16 with measures of only SSI and attempts at avoiding long-acting insulin given intermittent hypoglycemic episodes.  Psychiatry evaluated patient and recommended inpatient psychiatry admission once medically stable while resuming her home medications of Prozac and Remeron.  Psychiatry saw the patient today for follow-up.  Subjective: On assessment patient demonstrates some deep breathing and says she is having a panic attack. Reports high anxiety today due to ongoing abdominal pain. Reports feeling "depressed", "not suicidal". She reports good sleep and appetite overall. She denies  auditory or visual hallucinations, denies feeling paranoid, unsafe, does not express any delusions. She denies thoughts or plans of hurting self or others. She reports no side effects from medications she is getting here. She denies any physical complaints.  Past Medical History:  Past Medical History:  Diagnosis Date   Acute renal failure ([place]) 05/27/10    hemodialysis for 6 weeks   Anxiety    Asthma    Depression    Diabetes mellitus    Hypertension    Rhabdomyolysis 06/06/10   after drug overdose   Suicide attempt by drug ingestion ([place]) 06/05/10   result rhabdomyolosis and ARF requrining dialysis     Past Surgical History:  Procedure Laterality Date   ANKLE ARTHROSCOPY Right 08/17/2015   Procedure: RIGHT ANKLE ARTHROSCOPY WITH SYNOVECTOMY AND LOOSE BODY EXCISION;  Surgeon: Melrose Nakayama, MD;  Location: [facility];  Service: Orthopedics;  Laterality: Right;   CHOLECYSTECTOMY     TUBAL LIGATION  1998   Family History:  Family History  Problem Relation Age of Onset   Asthma Father    Family Psychiatric  History:  Social History:  Social History   Substance and Sexual Activity  Alcohol Use No     Social History   Substance and Sexual Activity  Drug Use No    Social History   Socioeconomic History   Marital status: Married    Spouse name: Not on file   Number of children: 2   Years of education: Not on file   Highest education level: Not on file  Occupational History   Occupation: Corporate treasurer  Tobacco Use   Smoking status: Never    Passive exposure: Never   Smokeless tobacco: Never  Vaping Use   Vaping Use: Never used  Substance and Sexual Activity   Alcohol use: No   Drug use: No   Sexual activity: Yes    Comment: with husband   Other Topics Concern   Not on file  Social History Narrative   Married high  school boyfriend at age 60, two boys, still married but he has been living with other women for years.   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  Nephro recommended avoiding use.   Haldol [Haloperidol Lactate] Other (See Comments)    Tardive dyskinesia   Nsaids Other (See Comments)    Nephro recommended avoiding after acute renal failure requiring hemodialysis associated with a suicide attempt.   Entresto [Sacubitril-Valsartan] Itching   Latex Other (See Comments)    Rash around IV site    Labs:  Results for orders placed or performed during the hospital encounter of 02/20/22 (from the past 48 hour(s))  Glucose, capillary     Status: Abnormal   Collection Time: 02/21/22 12:59 PM  Result Value Ref Range   Glucose-Capillary 336 (H) 70 - 99 mg/dL    Comment: Glucose reference range applies only to samples taken after fasting for at least 8 hours.  Glucose, capillary     Status: Abnormal   Collection Time: 02/21/22  2:46 PM  Result Value Ref Range   Glucose-Capillary 308 (H) 70 - 99 mg/dL    Comment: Glucose reference range applies only to samples taken after fasting for at least 8 hours.  Glucose, capillary     Status: Abnormal   Collection Time: 02/21/22  3:52 PM  Result Value Ref Range   Glucose-Capillary 254 (H) 70 - 99 mg/dL    Comment: Glucose reference range applies only to samples taken after fasting for at least 8 hours.  Glucose, capillary     Status: Abnormal   Collection Time: 02/21/22  5:27 PM  Result Value Ref Range   Glucose-Capillary 223 (H) 70 - 99 mg/dL    Comment: Glucose reference range applies only to samples taken after fasting for at least 8 hours.  Glucose, capillary     Status: Abnormal   Collection Time: 02/21/22  8:12 PM  Result Value Ref Range   Glucose-Capillary 231 (H) 70 - 99 mg/dL    Comment: Glucose reference range applies only to samples taken after fasting for at least 8 hours.  Magnesium     Status: None   Collection Time: 02/22/22  3:23 AM  Result Value Ref Range    Magnesium 2.0 1.7 - 2.4 mg/dL    Comment: Performed at [facility], [place] [address] [place], [place] [address]  Phosphorus     Status: None   Collection Time: 02/22/22  3:23 AM  Result Value Ref Range   Phosphorus 3.4 2.5 - 4.6 mg/dL    Comment: Performed at [facility] [address] [place], [place] [address]  CBC with Differential/Platelet     Status: Abnormal   Collection Time: 02/22/22  3:23 AM  Result Value Ref Range   WBC 11.7 (H) 4.0 - 10.5 K/uL   RBC 3.61 (L) 3.87 - 5.11 MIL/uL   Hemoglobin 9.9 (L) 12.0 - 15.0 g/dL   HCT 31.5 (  L) 36.0 - 46.0 %   MCV 87.3 80.0 - 100.0 fL   MCH 27.4 26.0 - 34.0 pg   MCHC 31.4 30.0 - 36.0 g/dL   RDW 13.0 11.5 - 15.5 %   Platelets 374 150 - 400 K/uL   nRBC 0.0 0.0 - 0.2 %   Neutrophils Relative % 62 %   Neutro Abs 7.1 1.7 - 7.7 K/uL   Lymphocytes Relative 29 %   Lymphs Abs 3.4 0.7 - 4.0 K/uL   Monocytes Relative 7 %   Monocytes Absolute 0.9 0.1 - 1.0 K/uL   Eosinophils Relative 2 %   Eosinophils Absolute 0.2 0.0 - 0.5 K/uL   Basophils Relative 0 %   Basophils Absolute 0.0 0.0 - 0.1 K/uL   Immature Granulocytes 0 %   Abs Immature Granulocytes 0.05 0.00 - 0.07 K/uL    Comment: Performed at [facility] [address] [place], [place] [address]  Comprehensive metabolic panel     Status: Abnormal   Collection Time: 02/22/22  3:23 AM  Result Value Ref Range   Sodium 138 135 - 145 mmol/L   Potassium 5.1 3.5 - 5.1 mmol/L   Chloride 106 98 - 111 mmol/L   CO2 24 22 - 32 mmol/L   Glucose, Bld 151 (H) 70 - 99 mg/dL    Comment: Glucose reference range applies only to samples taken after fasting for at least 8 hours.   BUN 31 (H) 6 - 20 mg/dL   Creatinine, Ser 2.11 (H) 0.44 - 1.00 mg/dL   Calcium 8.6 (L) 8.9 - 10.3 mg/dL   Total Protein 6.7 6.5 - 8.1 g/dL   Albumin 2.9 (L) 3.5 - 5.0 g/dL   AST 16 15 - 41 U/L   ALT 13 0 - 44 U/L   Alkaline Phosphatase 34 (L) 38 - 126 U/L   Total Bilirubin 0.5 0.3 - 1.2 mg/dL   GFR,  Estimated 28 (L) >60 mL/min    Comment: (NOTE) Calculated using the CKD-EPI Creatinine Equation (2021)    Anion gap 8 5 - 15    Comment: Performed at [facility] [address], [place], [place] [address]  Glucose, capillary     Status: Abnormal   Collection Time: 02/22/22  6:24 AM  Result Value Ref Range   Glucose-Capillary 140 (H) 70 - 99 mg/dL    Comment: Glucose reference range applies only to samples taken after fasting for at least 8 hours.  Glucose, capillary     Status: Abnormal   Collection Time: 02/22/22  7:18 AM  Result Value Ref Range   Glucose-Capillary 146 (H) 70 - 99 mg/dL    Comment: Glucose reference range applies only to samples taken after fasting for at least 8 hours.  Glucose, capillary     Status: Abnormal   Collection Time: 02/22/22 11:32 AM  Result Value Ref Range   Glucose-Capillary 179 (H) 70 - 99 mg/dL    Comment: Glucose reference range applies only to samples taken after fasting for at least 8 hours.  Glucose, capillary     Status: Abnormal   Collection Time: 02/22/22  4:53 PM  Result Value Ref Range   Glucose-Capillary 195 (H) 70 - 99 mg/dL    Comment: Glucose reference range applies only to samples taken after fasting for at least 8 hours.  Glucose, capillary     Status: Abnormal   Collection Time: 02/22/22  8:09 PM  Result Value Ref Range   Glucose-Capillary 131 (H)  70 - 99 mg/dL    Comment: Glucose reference range applies only to samples taken after fasting for at least 8 hours.  Basic metabolic panel     Status: Abnormal   Collection Time: 02/23/22  2:51 AM  Result Value Ref Range   Sodium 137 135 - 145 mmol/L   Potassium 5.4 (H) 3.5 - 5.1 mmol/L   Chloride 107 98 - 111 mmol/L   CO2 24 22 - 32 mmol/L   Glucose, Bld 146 (H) 70 - 99 mg/dL    Comment: Glucose reference range applies only to samples taken after fasting for at least 8 hours.   BUN 31 (H) 6 - 20 mg/dL   Creatinine, Ser 1.92 (H) 0.44 - 1.00 mg/dL   Calcium 8.5 (L) 8.9 -  10.3 mg/dL   GFR, Estimated 31 (L) >60 mL/min    Comment: (NOTE) Calculated using the CKD-EPI Creatinine Equation (2021)    Anion gap 6 5 - 15    Comment: Performed at [facility] [address], [place], [place] [address]  CBC     Status: Abnormal   Collection Time: 02/23/22  2:51 AM  Result Value Ref Range   WBC 8.3 4.0 - 10.5 K/uL   RBC 3.54 (L) 3.87 - 5.11 MIL/uL   Hemoglobin 9.7 (L) 12.0 - 15.0 g/dL   HCT 30.4 (L) 36.0 - 46.0 %   MCV 85.9 80.0 - 100.0 fL   MCH 27.4 26.0 - 34.0 pg   MCHC 31.9 30.0 - 36.0 g/dL   RDW 13.1 11.5 - 15.5 %   Platelets 358 150 - 400 K/uL   nRBC 0.0 0.0 - 0.2 %    Comment: Performed at [facility], [place] [address], [place], [place] [address]  Glucose, capillary     Status: Abnormal   Collection Time: 02/23/22  8:22 AM  Result Value Ref Range   Glucose-Capillary 132 (H) 70 - 99 mg/dL    Comment: Glucose reference range applies only to samples taken after fasting for at least 8 hours.    Current Facility-Administered Medications  Medication Dose Route Frequency Provider Last Rate Last Admin   0.9 %  sodium chloride infusion   Intravenous PRN Jacky Kindle, MD   Stopped at 02/21/22 1351   acetaminophen (TYLENOL) tablet 650 mg  650 mg Oral Q4H PRN Frederik Pear, MD   650 mg at 02/23/22 0532   albuterol (PROVENTIL) (2.5 MG/3ML) 0.083% nebulizer solution 2.5 mg  2.5 mg Inhalation Q4H PRN Martyn Malay, MD       Chlorhexidine Gluconate Cloth 2 % PADS 6 each  6 each Topical Daily Freda Jackson B, MD   6 each at 02/23/22 1124   dextrose 50 % solution 50 mL  1 ampule Intravenous Q15 min PRN Frederik Pear, MD       dicyclomine (BENTYL) tablet 20 mg  20 mg Oral TID AC Martyn Malay, MD   20 mg at 02/23/22 1125   enoxaparin (LOVENOX) injection 87.5 mg  0.5 mg/kg Subcutaneous Q24H Dorris Singh M, MD   87.5 mg at 02/22/22 1201   FLUoxetine (PROZAC) capsule 40 mg  40 mg Oral Daily Virl Axe, MD   40 mg at 02/23/22 1126    fluticasone furoate-vilanterol (BREO ELLIPTA) 200-25 MCG/ACT 1 puff  1 puff Inhalation Daily Freddi Starr, MD   1 puff at 02/22/22 0733   furosemide (LASIX) tablet 40 mg  40 mg Oral Daily Dorris Singh M,  MD   40 mg at 02/23/22 1126   hydrOXYzine (ATARAX) tablet 10 mg  10 mg Oral Once Colletta Maryland, MD       insulin aspart (novoLOG) injection 0-15 Units  0-15 Units Subcutaneous TID WC Jacky Kindle, MD   3 Units at 02/22/22 1705   insulin aspart (novoLOG) injection 0-5 Units  0-5 Units Subcutaneous QHS Jacky Kindle, MD   2 Units at 02/21/22 2017   isosorbide-hydrALAZINE (BIDIL) 20-37.5 MG per tablet 1 tablet  1 tablet Oral TID Martyn Malay, MD   1 tablet at 02/22/22 2200   levothyroxine (SYNTHROID) tablet 300 mcg  300 mcg Oral Q0600 Freda Jackson B, MD   300 mcg at 02/23/22 0532   metoprolol tartrate (LOPRESSOR) tablet 25 mg  25 mg Oral BID Martyn Malay, MD   25 mg at 02/23/22 1126   mirtazapine (REMERON) tablet 30 mg  30 mg Oral Daily Virl Axe, MD   30 mg at 02/23/22 1127   ondansetron (ZOFRAN) injection 4 mg  4 mg Intravenous Q6H PRN Freddi Starr, MD       Oral care mouth rinse  15 mL Mouth Rinse PRN Freddi Starr, MD       pantoprazole (PROTONIX) EC tablet 40 mg  40 mg Oral Daily Virl Axe, MD   40 mg at 02/23/22 1126   polyethylene glycol (MIRALAX / GLYCOLAX) packet 17 g  17 g Oral Daily Alcus Dad, MD   17 g at 02/23/22 1124   rosuvastatin (CRESTOR) tablet 20 mg  20 mg Oral Daily Martyn Malay, MD   20 mg at 02/23/22 1127   sodium chloride flush (NS) 0.9 % injection 3 mL  3 mL Intravenous Q12H Rex Kras, PA   3 mL at 02/22/22 2201     Psychiatric Specialty Exam:  Presentation  General Appearance:  Appropriate for Environment  Eye Contact: Good  Speech: Clear and Coherent  Speech Volume: Decreased  Handedness: Right   Mood and Affect  Mood: Depressed; Anxious  Affect: Congruent   Thought Process  Thought  Processes: Coherent  Descriptions of Associations:Intact  Orientation:Full (Time, Place and Person)  Thought Content:Logical  History of Schizophrenia/Schizoaffective disorder:No data recorded Duration of Psychotic Symptoms:No data recorded Hallucinations:No data recorded  Ideas of Reference:None  Suicidal Thoughts: vague Homicidal Thoughts:No data recorded   Sensorium  Memory: Immediate Fair; Recent Fair; Remote Fair  Judgment: Fair  Insight: Lacking   Executive Functions  Concentration: Good  Attention Span: Good  Recall: [place] of Knowledge: Fair  Language: Fair   Psychomotor Activity  Psychomotor Activity: No data recorded   Assets  Assets: Desire for Improvement; Social Support   Sleep  Sleep: No data recorded   Physical Exam: Physical Exam ROS Blood pressure (!) 138/99, pulse 88, temperature 98.3 F (36.8 C), temperature source Oral, resp. rate 16, height 5\' 11"  (1.803 m), weight (!) 173.5 kg, SpO2 99 %. Body mass index is 53.33 kg/m.  Assessment: Patient is a 51 year old female with the above-stated past psychiatric history who is seen in follow-up. Chart reviewed. Patient made a suicide attempt by overdose on ibuprofen and insulin in the context of worsening depression with holiday season and anniversary of death of her son and father. Patient continues to be a high risk for suicide. She meets criteria for inpatient psychiatric admission once medically cleared.   Recommendations:  -Recommend psychiatric Inpatient admission when medically cleared.  - Continue Prozac 40 mg daily - Continue Remeron 30  mg p.o. nightly  Psychiatry will follow while here.  Larita Fife, MD 02/23/2022 12:52 PM

## 2022-02-23 NOTE — Progress Notes (Signed)
FMTS Interim Progress Note  S: Went to bedside to evaluate patient for abdominal pain.  She notes that it is so much better and the medication worked.  When I asked which medication, she stated all of them.  She has not stooled today but has been passing gas.  O: BP 115/67 (BP Location: Left Wrist)   Pulse 88   Temp 98.8 F (37.1 C) (Oral)   Resp 16   Ht 5\' 11"  (1.803 m)   Wt (!) 173.5 kg   SpO2 98%   BMI 53.33 kg/m   General: Awake, alert, NAD CV: RRR Pulm: Normal WOB Abdomen: Soft, obese abdomen, nontender in all 4 quadrants, normoactive bowel sounds  A/P: Hyperkalemia: Received therapeutic measures prior to encounter, recheck BMP at 1700 Abdominal pain: Resolved for now, no change in plan  Wells Guiles, DO 02/23/2022, 4:42 PM PGY-2, [facility] Service pager [phone]

## 2022-02-24 ENCOUNTER — Encounter (HOSPITAL_COMMUNITY): Payer: Self-pay | Admitting: Pulmonary Disease

## 2022-02-24 ENCOUNTER — Inpatient Hospital Stay
Admission: AD | Admit: 2022-02-24 | Discharge: 2022-02-26 | DRG: 885 | Disposition: A | Discharge status: Home / Self Care | Payer: BC Managed Care – PPO | Source: Intra-hospital | Attending: Psychiatry | Admitting: Psychiatry

## 2022-02-24 ENCOUNTER — Other Ambulatory Visit: Payer: Self-pay

## 2022-02-24 ENCOUNTER — Encounter: Payer: Self-pay | Admitting: Psychiatry

## 2022-02-24 DIAGNOSIS — M1990 Unspecified osteoarthritis, unspecified site: Secondary | ICD-10-CM | POA: Diagnosis present

## 2022-02-24 DIAGNOSIS — Z79890 Hormone replacement therapy (postmenopausal): Secondary | ICD-10-CM | POA: Diagnosis not present

## 2022-02-24 DIAGNOSIS — Z79899 Other long term (current) drug therapy: Secondary | ICD-10-CM

## 2022-02-24 DIAGNOSIS — I10 Essential (primary) hypertension: Secondary | ICD-10-CM | POA: Diagnosis present

## 2022-02-24 DIAGNOSIS — F411 Generalized anxiety disorder: Secondary | ICD-10-CM | POA: Diagnosis present

## 2022-02-24 DIAGNOSIS — F332 Major depressive disorder, recurrent severe without psychotic features: Secondary | ICD-10-CM | POA: Diagnosis present

## 2022-02-24 DIAGNOSIS — F419 Anxiety disorder, unspecified: Secondary | ICD-10-CM | POA: Diagnosis present

## 2022-02-24 DIAGNOSIS — E039 Hypothyroidism, unspecified: Secondary | ICD-10-CM | POA: Diagnosis present

## 2022-02-24 DIAGNOSIS — T383X1A Poisoning by insulin and oral hypoglycemic [antidiabetic] drugs, accidental (unintentional), initial encounter: Secondary | ICD-10-CM | POA: Diagnosis present

## 2022-02-24 DIAGNOSIS — J45909 Unspecified asthma, uncomplicated: Secondary | ICD-10-CM | POA: Diagnosis present

## 2022-02-24 DIAGNOSIS — E118 Type 2 diabetes mellitus with unspecified complications: Secondary | ICD-10-CM | POA: Diagnosis present

## 2022-02-24 DIAGNOSIS — K589 Irritable bowel syndrome without diarrhea: Secondary | ICD-10-CM | POA: Diagnosis present

## 2022-02-24 DIAGNOSIS — E119 Type 2 diabetes mellitus without complications: Secondary | ICD-10-CM | POA: Diagnosis present

## 2022-02-24 DIAGNOSIS — J309 Allergic rhinitis, unspecified: Secondary | ICD-10-CM | POA: Diagnosis present

## 2022-02-24 DIAGNOSIS — F429 Obsessive-compulsive disorder, unspecified: Secondary | ICD-10-CM | POA: Diagnosis present

## 2022-02-24 DIAGNOSIS — Z7984 Long term (current) use of oral hypoglycemic drugs: Secondary | ICD-10-CM

## 2022-02-24 DIAGNOSIS — T1491XA Suicide attempt, initial encounter: Secondary | ICD-10-CM | POA: Diagnosis not present

## 2022-02-24 DIAGNOSIS — K219 Gastro-esophageal reflux disease without esophagitis: Secondary | ICD-10-CM | POA: Diagnosis present

## 2022-02-24 DIAGNOSIS — T383X2A Poisoning by insulin and oral hypoglycemic [antidiabetic] drugs, intentional self-harm, initial encounter: Secondary | ICD-10-CM | POA: Diagnosis present

## 2022-02-24 DIAGNOSIS — I5042 Chronic combined systolic (congestive) and diastolic (congestive) heart failure: Secondary | ICD-10-CM

## 2022-02-24 LAB — SARS CORONAVIRUS 2 BY RT PCR: SARS Coronavirus 2 by RT PCR: NEGATIVE

## 2022-02-24 LAB — CBC
HCT: 31 % — ABNORMAL LOW (ref 36.0–46.0)
Hemoglobin: 9.9 g/dL — ABNORMAL LOW (ref 12.0–15.0)
MCH: 27 pg (ref 26.0–34.0)
MCHC: 31.9 g/dL (ref 30.0–36.0)
MCV: 84.7 fL (ref 80.0–100.0)
Platelets: 356 10*3/uL (ref 150–400)
RBC: 3.66 MIL/uL — ABNORMAL LOW (ref 3.87–5.11)
RDW: 12.8 % (ref 11.5–15.5)
WBC: 7.6 10*3/uL (ref 4.0–10.5)
nRBC: 0 % (ref 0.0–0.2)

## 2022-02-24 LAB — COMPREHENSIVE METABOLIC PANEL
ALT: 14 U/L (ref 0–44)
AST: 21 U/L (ref 15–41)
Albumin: 2.8 g/dL — ABNORMAL LOW (ref 3.5–5.0)
Alkaline Phosphatase: 39 U/L (ref 38–126)
Anion gap: 7 (ref 5–15)
BUN: 28 mg/dL — ABNORMAL HIGH (ref 6–20)
CO2: 26 mmol/L (ref 22–32)
Calcium: 9 mg/dL (ref 8.9–10.3)
Chloride: 105 mmol/L (ref 98–111)
Creatinine, Ser: 1.79 mg/dL — ABNORMAL HIGH (ref 0.44–1.00)
GFR, Estimated: 34 mL/min — ABNORMAL LOW (ref >60)
Glucose, Bld: 150 mg/dL — ABNORMAL HIGH (ref 70–99)
Potassium: 5 mmol/L (ref 3.5–5.1)
Sodium: 138 mmol/L (ref 135–145)
Total Bilirubin: 0.4 mg/dL (ref 0.3–1.2)
Total Protein: 6.3 g/dL — ABNORMAL LOW (ref 6.5–8.1)

## 2022-02-24 LAB — GLUCOSE, CAPILLARY
Glucose-Capillary: 147 mg/dL — ABNORMAL HIGH (ref 70–99)
Glucose-Capillary: 266 mg/dL — ABNORMAL HIGH (ref 70–99)
Glucose-Capillary: 244 mg/dL — ABNORMAL HIGH (ref 70–99)
Glucose-Capillary: 204 mg/dL — ABNORMAL HIGH (ref 70–99)

## 2022-02-24 MED ORDER — FLUOXETINE HCL 40 MG PO CAPS: 40.0000 mg | ORAL_CAPSULE | Freq: Every day | ORAL | 0 refills | Status: DC

## 2022-02-24 MED ORDER — ALUM & MAG HYDROXIDE-SIMETH 200-200-20 MG/5ML PO SUSP: 30.0000 mL | ORAL | Status: DC | PRN

## 2022-02-24 MED ORDER — INSULIN ASPART 100 UNIT/ML IJ SOLN
2.0000 [IU] | Freq: Once | INTRAMUSCULAR | Status: AC
  Administered 2022-02-24: 2 [IU] via SUBCUTANEOUS
  Filled 2022-02-24: qty 1

## 2022-02-24 MED ORDER — BASAGLAR KWIKPEN 100 UNIT/ML ~~LOC~~ SOPN: 10.0000 [IU] | PEN_INJECTOR | Freq: Every day | SUBCUTANEOUS | 6 refills | Status: DC

## 2022-02-24 MED ORDER — METFORMIN HCL ER 750 MG PO TB24: 750.0000 mg | ORAL_TABLET | Freq: Two times a day (BID) | ORAL | Status: DC

## 2022-02-24 MED ORDER — HYDROXYZINE HCL 50 MG PO TABS
50.0000 mg | ORAL_TABLET | Freq: Three times a day (TID) | ORAL | Status: DC | PRN
  Administered 2022-02-24: 50 mg via ORAL
  Filled 2022-02-24: qty 1

## 2022-02-24 MED ORDER — FUROSEMIDE 40 MG PO TABS: 40.0000 mg | ORAL_TABLET | Freq: Every day | ORAL | 0 refills | Status: DC

## 2022-02-24 MED ORDER — ACETAMINOPHEN 325 MG PO TABS
650.0000 mg | ORAL_TABLET | Freq: Four times a day (QID) | ORAL | Status: DC | PRN
  Administered 2022-02-26: 650 mg via ORAL
  Filled 2022-02-24: qty 2

## 2022-02-24 MED ORDER — DICYCLOMINE HCL 20 MG PO TABS: 20.0000 mg | ORAL_TABLET | Freq: Three times a day (TID) | ORAL | Status: DC | PRN

## 2022-02-24 MED ORDER — MAGNESIUM HYDROXIDE 400 MG/5ML PO SUSP: 30.0000 mL | Freq: Every day | ORAL | Status: DC | PRN

## 2022-02-24 MED ORDER — TRAZODONE HCL 100 MG PO TABS
100.0000 mg | ORAL_TABLET | Freq: Every day | ORAL | Status: DC
  Administered 2022-02-24 – 2022-02-25 (×2): 100 mg via ORAL
  Filled 2022-02-24 (×2): qty 1

## 2022-02-24 NOTE — Progress Notes (Signed)
02/24/2022  [name] DOB: 1970-05-19 MRN: [id number]   RIDER WAIVER AND RELEASE OF LIABILITY  For the purposes of helping with transportation needs, [facility] partners with outside transportation providers (taxi companies, [place], Social research officer, government.) to give [employer] patients or other approved people the choice of on-demand rides [employer]") to our buildings for non-emergency visits.  By using [employer], I, the person signing this document, on behalf of myself and/or any legal minors (in my care using the [employer]), agree:  Government social research officer given to me are supplied by independent, outside transportation providers who do not work for, or have any affiliation with, [employer]. [place] is not a transportation company. [facility] has no control over the quality or safety of the rides I get using [employer]. [facility] has no control over whether any outside ride will happen on time or not. [facility] gives no guarantee on the reliability, quality, safety, or availability on any rides, or that no mistakes will happen. I know and accept that traveling by vehicle (car, truck, SVU, Lucianne Lei, bus, taxi, etc.) has risks of serious injuries such as disability, being paralyzed, and death. I know and agree the risk of using [employer] is mine alone, and not [employer]. Transport Services are provided "as is" and as are available. The transportation providers are in charge for all inspections and care of the vehicles used to provide these rides. I agree not to take legal action against [facility], its agents, employees, officers, directors, representatives, insurers, attorneys, assigns, successors, subsidiaries, and affiliates at any time for any reasons related directly or indirectly to using [employer]. I also agree not to take legal action against  or its affiliates for any injury, death, or damage to property caused by or related to using  [employer]. I have read this Waiver and Release of Liability, and I understand the terms used in it and their legal meaning. This Waiver is freely and voluntarily given with the understanding that my right (or any legal minors) to legal action against [facility] relating to [employer] is knowingly given up to use these services.   I attest that I read the Ride Waiver and Release of Liability to [name], gave [name] the opportunity to ask questions and answered the questions asked (if any). I affirm that [name] then provided consent for assistance with transportation.

## 2022-02-24 NOTE — Inpatient Diabetes Management (Signed)
Inpatient Diabetes Program Recommendations  AACE/ADA: New Consensus Statement on Inpatient Glycemic Control  Target Ranges:  Prepandial:   less than 140 mg/dL      Peak postprandial:   less than 180 mg/dL (1-2 hours)      Critically ill patients:  140 - 180 mg/dL    Latest Reference Range & Units 02/23/22 08:22 02/23/22 12:55 02/23/22 16:18 02/23/22 22:19 02/24/22 08:06  Glucose-Capillary 70 - 99 mg/dL 132 (H) 163 (H) 212 (H) 206 (H) 147 (H)   Review of Glycemic Control  Diabetes history: DM2 Outpatient Diabetes medications: Basaglar 65 units BID, Novolog 15-20 units BID Current orders for Inpatient glycemic control: Novolog 0-15 units TID with meals, Novolog 0-5 units QHS  Inpatient Diabetes Program Recommendations:    Insulin: Please consider ordering Novolog 3 units TID with meals for meal coverage if patient eats at least 50% of meals.  Thanks, Barnie Alderman, RN, MSN, [facility] Diabetes Coordinator Inpatient Diabetes Program [phone] (Team Pager from 8am to [place])

## 2022-02-24 NOTE — TOC Initial Note (Addendum)
Transition of Care [facility]) - Initial/Assessment Note    Patient Details  Name: [name] MRN: [id number] Date of Birth: 1971-02-01  Transition of Care [facility]) CM/SW Contact:    Bethann Berkshire, [place] Phone Number: 02/24/2022, 11:05 AM  Clinical Narrative:                  Psych notified CSW that pt needing inpatient psych and is medically stable. CSW made referral to [facility] and BMU.   BMU can accept pt once a negative covid test result is received. Rapid covid test is ordered and RN aware. CSW updated pt. She states she will update her family regarding her DC.  1500: Covid test is negative. BMU requesting a UDS is collected prior to discharge. We do not need to wait for UDS results to DC, just for it to be collected. RN notified. BMU requested that bedside RN contacting BMU once UDS sample is collected. At that time they will notify RN when pt can be transported.   1620: Pt may DC afterhours. CSW provided RN number to call safe transport [phone] and afterhours# [phone]) once UDS sample is collected and BMU provides an admission time. CSW faxed voluntary admission for to BMU at [phone].  Expected Discharge Plan: [facility] Barriers to Discharge: Other (must enter comment) (Covid test before admit to BMU)   Patient Goals and CMS Choice        Expected Discharge Plan and Services Expected Discharge Plan: [facility]                                              Prior Living Arrangements/Services     Patient language and need for interpreter reviewed:: Yes              Criminal Activity/Legal Involvement Pertinent to Current Situation/Hospitalization: No - Comment as needed  Activities of Daily Living Home Assistive Devices/Equipment: None ADL Screening (condition at time of admission) Patient's cognitive ability adequate to safely complete daily activities?: Yes Is the patient deaf or have difficulty hearing?: No Does the  patient have difficulty seeing, even when wearing glasses/contacts?: No Does the patient have difficulty concentrating, remembering, or making decisions?: No Patient able to express need for assistance with ADLs?: Yes Does the patient have difficulty dressing or bathing?: No Independently performs ADLs?: Yes (appropriate for developmental age) Does the patient have difficulty walking or climbing stairs?: No Weakness of Legs: None Weakness of Arms/Hands: None  Permission Sought/Granted                  Emotional Assessment       Orientation: : Oriented to Self, Oriented to Place, Oriented to  Time, Oriented to Situation   Psych Involvement: Yes (comment)  Admission diagnosis:  Suicidal ideation [R45.851] Drug overdose [T50.901A] Hypoglycemia [E16.2] Patient Active Problem List   Diagnosis Date Noted   Hyperkalemia 02/23/2022   Suicide attempt ([place]) 02/22/2022   Acute-on-chronic kidney injury ([place]) 02/22/2022   Drug overdose 02/20/2022   Onychomycosis 10/01/2021   SVT (supraventricular tachycardia) 05/28/2021   Obesity hypoventilation syndrome ([place]) 05/28/2021   Congestive heart failure ([place])    Multifocal pneumonia 04/25/2021   Severe sepsis ([place])    Healthcare maintenance 04/15/2021   Asthma 12/18/2020   Vaginal irritation 09/28/2020   Mild depression 08/30/2020   Arthritis of right ankle 07/25/2020  Right knee pain 06/21/2020   Sinus pain 03/01/2020   Bleeding nose 03/01/2020   Depression 11/21/2019   Moderate episode of recurrent major depressive disorder ([place]) 09/22/2019   Generalized anxiety disorder 09/22/2019   Bipolar I disorder, most recent episode depressed ([place]) 09/22/2019   Bilateral knee pain 03/14/2019   Osteoarthritis 11/24/2018   Bipolar disorder ([place]) 04/29/2017   HLD (hyperlipidemia) 03/19/2017   Asthma exacerbation 03/14/2017   Weight gain 12/06/2016   MDD (major depressive disorder), recurrent severe, without psychosis ([place]) 05/11/2015    Dyspnea 05/09/2015   Essential hypertension    Vitamin D deficiency 04/11/2015   Fatigue 04/10/2015   Non compliance w medication regimen 04/10/2015   Adjustment disorder with mixed anxiety and depressed mood    Cough 09/20/2014   Grief at loss of child 06/22/2014   Dysmenorrhea 11/25/2013   Suicidal ideation 05/27/2011   CKD (chronic kidney disease) stage 3, GFR 30-59 ml/min ([place]) 11/06/2010   Abdominal pain 01/14/2010   ENDOMETRIAL HYPERPLASIA UNSPECIFIED 02/07/2008   MENORRHAGIA 01/03/2008   Allergic rhinitis 06/24/2007   Essential hypertension, benign 02/03/2007   Hypothyroidism 05/07/2006   Type II diabetes mellitus with complication ([place]) 44/31/5400   Morbid obesity ([place]) 05/07/2006   Iron deficiency anemia 05/07/2006   Anxiety 05/07/2006   OBSESSIVE COMPUL. DISORDER 05/07/2006   PCP:  [name], DO Pharmacy:   [facility] [address] [place] [address] Phone: [phone] Fax: [place], [place] [address] Phone: [phone] Fax: [phone]  [place] ([place]), [place] - [address] [name] ([place]) [place] [address] Phone: [phone] Fax: [place] [address] [place] [address] Phone: [phone] Fax: [place] [address] [place] [address] Phone: [phone] Fax: [phone]     Social Determinants of Health (SDOH) Interventions    Readmission Risk Interventions     No data to display

## 2022-02-24 NOTE — Tx Team (Signed)
Initial Treatment Plan 02/24/2022 8:26 PM [name] [name] [id number]:[id number]    PATIENT STRESSORS: Loss of a son seven years ago     PATIENT STRENGTHS: General fund of knowledge  Motivation for treatment/growth  Supportive family/friends    PATIENT IDENTIFIED PROBLEMS: SI  "I feel depressed it would be 71 th birthday for my late son"  "I took an overdose of insulin and some pills"                 DISCHARGE CRITERIA:  Ability to meet basic life and health needs Improved stabilization in mood, thinking, and/or behavior Motivation to continue treatment in a less acute level of care Need for constant or close observation no longer present Verbal commitment to aftercare and medication compliance  PRELIMINARY DISCHARGE PLAN: Return to previous living arrangement  PATIENT/FAMILY INVOLVEMENT: This treatment plan has been presented to and reviewed with the patient, [name].  The patient and family have been given the opportunity to ask questions and make suggestions.  Wylie Hail, RN 02/24/2022, 8:26 PM

## 2022-02-24 NOTE — BH Assessment (Signed)
Patient is to be admitted to [facility] BMU today 02/24/22 by Dr. Weber Cooks.  Attending Physician will be Dr.  Weber Cooks .   Patient has been assigned to room 309, by [facility] Charge Nurse, Ramaya Guile.     ER staff is aware of the admission:  Elberta Fortis, Patient Access.

## 2022-02-24 NOTE — Plan of Care (Incomplete)
Pt A&OX4. BP (!) 140/88 (BP Location: Left Wrist)   Pulse 88   Temp 98.5 F (36.9 C) (Oral)   Resp 18   Ht 5\' 11"  (1.803 m)   Wt (!) 173.5 kg   SpO2 96%   BMI 53.33 kg/m  No SI/HI stated at this time. All IV and telemetry removed. Personal belongings returned. AVS reviewed with no questions. Pending Room and transport to BMU. Louanne Skye 02/24/22 1:10 PM

## 2022-02-24 NOTE — Discharge Summary (Addendum)
[facility] Discharge Summary  Patient name: [name] Medical record number: [id number] Date of birth: October 04, 1970 Age: 51 y.o. Gender: female Date of Admission: 02/20/2022  Date of Discharge: 02/24/2022 Admitting Physician: Freddi Starr, MD  Primary Care Provider: [name], DO Consultants: Psychiatry, CCM  Indication for Hospitalization: Intentional overdose  Discharge Diagnoses/Problem List:  Principal Problem for Admission: Intentional overdose Other Problems addressed during stay:  Principal Problem:   [facility]) Active Problems:   Abdominal pain   Type II diabetes mellitus with complication ([place])   Hyperkalemia   Acute-on-chronic kidney injury ([place])   Grief at loss of child   Essential hypertension   Generalized anxiety disorder   Depression  Brief Hospital Course:  SAMEKA BAGENT is a 51 y.o.female with a history of T2DM, HTN, anxiety/depression, hypothyroidism, HLD, CKD 3, and prior suicide attempt in 2012 who was admitted to the [facility] Medicine Teaching Service at [facility] for attempted suicide with insulin and naproxen. Her hospital course is detailed below:  Suicide attempt Presented to [facility] ED per EMS after taking 1800 units of NovoLog and 6 g of naproxen with CBG 38. Patient was admitted to ICU on D10 infusion after receiving several D50 ampules. D10 infusion was continued with additional measures of glucagon and D50 pushes.  She was placed on daily PPI for GI prophylaxis regarding naproxen ingestion. Patient was transferred to floor status on 12/16 with SSI and attempts at avoiding long-acting insulin given intermittent hypoglycemic episodes. Psychiatry evaluated patient and recommended resuming her home medications of Prozac and Remeron. Transferred to inpatient behavioral health on 12/18.  Other chronic conditions were medically managed with home medications and formulary alternatives as necessary (T2DM, CKD,  HLD, hypothyroidism, asthma)  PCP Follow-up Recommendations:  Changed from Lasix 89m BID to daily dosing, monitor volume status. Titrate up long acting insulin based on CBGs (prior to admission taking 65u BID, discharged on 10u BID) Decreased Metformin to 15049mdaily for renal dosing Started on Bentyl for abdominal pain/spasms  Disposition: Inpatient BH[place]Discharge Condition: Stable  Discharge Exam:  Vitals:   02/24/22 0535 02/24/22 0804  BP: (!) 148/97 (!) 140/88  Pulse: 87 88  Resp: 16 18  Temp: 98.4 F (36.9 C) 98.5 F (36.9 C)  SpO2: 98% 96%   General: Well-appearing. Alert. NAD CV: RRR without murmur Pulm: CTAB. Normal WOB on RA. No wheezing Abdomen: Soft, non-tender, non-distended. +BS Ext: Well perfused. Moves all extremities spontaneously  Significant Procedures: None  Significant Labs and Imaging:  Recent Labs  Lab 02/23/22 0251 02/24/22 0435  WBC 8.3 7.6  HGB 9.7* 9.9*  HCT 30.4* 31.0*  PLT 358 356   Recent Labs  Lab 02/23/22 0251 02/23/22 1111 02/23/22 1618 02/24/22 0435  NA 137 135 138 138  K 5.4* 6.5* 5.1 5.0  CL 107 103 106 105  CO2 24 21* 23 26  GLUCOSE 146* 161* 238* 150*  BUN 31* 31* 30* 28*  CREATININE 1.92* 1.87* 1.93* 1.79*  CALCIUM 8.5* 9.2 9.0 9.0  ALKPHOS  --  44  --  39  AST  --  26  --  21  ALT  --  15  --  14  ALBUMIN  --  3.4*  --  2.8*    Pertinent Imaging: CT ABDOMEN PELVIS WO CONTRAST Result Date: 02/22/2022 IMPRESSION: No evidence of appendicitis or bowel obstruction. No CT findings to account for the patient's right lower quadrant abdominal pain. Status post cholecystectomy.  US Abdomen Limited RUQ (LIVER/GB) Result Date: 02/20/2022 IMPRESSION: 1. Hepatic steatosis. Please note limited evaluation for focal hepatic masses in a patient with hepatic steatosis due to decreased penetration of the acoustic ultrasound waves. 2. Status post cholecystectomy.  Results/Tests Pending at Time of Discharge: None  Discharge  Medications:  Allergies as of 02/24/2022       Reactions   Ace Inhibitors Other (See Comments)   Patient had acute renal failure requiring hemodialysis after a suicide attempt.  Nephro recommended avoiding use.   Haldol [haloperidol Lactate] Other (See Comments)   Tardive dyskinesia   Nsaids Other (See Comments)   Nephro recommended avoiding after acute renal failure requiring hemodialysis associated with a suicide attempt.   Entresto [sacubitril-valsartan] Itching   Latex Other (See Comments)   Rash around IV site        Medication List     STOP taking these medications    hydrOXYzine 50 MG capsule Commonly known as: VISTARIL   LORazepam 1 MG tablet Commonly known as: ATIVAN   metFORMIN 1000 MG tablet Commonly known as: GLUCOPHAGE Replaced by: metFORMIN 750 MG 24 hr tablet       TAKE these medications    albuterol 108 (90 Base) MCG/ACT inhaler Commonly known as: VENTOLIN HFA Inhale 2 puffs into the lungs every 4 (four) hours as needed for wheezing or shortness of breath.   albuterol (2.5 MG/3ML) 0.083% nebulizer solution Commonly known as: PROVENTIL Take 3 mLs by nebulization 3 (three) times daily as needed.   Basaglar KwikPen 100 UNIT/ML Inject 10 Units into the skin daily. Take 65U twice a day What changed:  how much to take when to take this   blood glucose meter kit and supplies Kit Dispense based on patient and insurance preference. Use up to four times daily as directed.   dicyclomine 20 MG tablet Commonly known as: BENTYL Take 1 tablet (20 mg total) by mouth 3 (three) times daily as needed for spasms (abdominal pain).   famotidine 20 MG tablet Commonly known as: PEPCID Take 20 mg by mouth 2 (two) times daily.   FLUoxetine 40 MG capsule Commonly known as: PROZAC Take 1 capsule (40 mg total) by mouth daily. What changed: how much to take   fluticasone 50 MCG/ACT nasal spray Commonly known as: FLONASE Place 1 spray into both nostrils daily. 1  spray in each nostril every day What changed:  when to take this reasons to take this additional instructions   fluticasone furoate-vilanterol 200-25 MCG/ACT Aepb Commonly known as: Breo Ellipta Inhale 1 puff into the lungs daily.   furosemide 40 MG tablet Commonly known as: LASIX Take 1 tablet (40 mg total) by mouth daily. What changed: when to take this   insulin aspart 100 UNIT/ML FlexPen Commonly known as: NOVOLOG Inject 10 units with Lunch and 15 units with Dinner What changed:  how much to take when to take this additional instructions   isosorbide-hydrALAZINE 20-37.5 MG tablet Commonly known as: BIDIL Take 1 tablet by mouth 3 (three) times daily.   levothyroxine 300 MCG tablet Commonly known as: SYNTHROID Take 1 tablet (300 mcg total) by mouth daily before breakfast.   metFORMIN 750 MG 24 hr tablet Commonly known as: GLUCOPHAGE-XR Take 1 tablet (750 mg total) by mouth 2 (two) times daily with a meal. Replaces: metFORMIN 1000 MG tablet   metoprolol tartrate 25 MG tablet Commonly known as: LOPRESSOR Take 1 tablet (25 mg total) by mouth 2 (two) times daily.   mirtazapine 30  MG tablet Commonly known as: REMERON Take 1 tablet (30 mg total) by mouth daily.   rosuvastatin 20 MG tablet Commonly known as: Crestor Take 1 tablet (20 mg total) by mouth daily.   Semaglutide (1 MG/DOSE) 4 MG/3ML Sopn Inject 1 mg into the skin once a week. What changed: additional instructions   Vitamin D (Ergocalciferol) 1.25 MG (50000 UNIT) Caps capsule Commonly known as: DRISDOL Take 1 capsule (50,000 Units total) by mouth every 7 (seven) days. Sunday        Discharge Instructions: Please refer to Patient Instructions section of EMR for full details.  Patient was counseled important signs and symptoms that should prompt return to medical care, changes in medications, dietary instructions, activity restrictions, and follow up appointments.   Follow-Up Appointments: Inpatient  behavioral health  Future Appointments  Date Time Provider [place]  03/12/2022  1:30 PM [name], DO [facility] [place]     Colletta Maryland, MD 02/24/2022, 12:45 PM PGY-1, [place] Upper-Level Resident Addendum   I have independently interviewed and examined the patient. I have discussed the above with Dr. Jerilee Hoh and agree with the documented plan. My edits for correction/addition/clarification are included above. Please see any attending notes.   Alcus Dad, MD PGY-3, [place] [facility] 02/24/2022 12:55 PM  FPTS Service pager: [phone] (text pages welcome through [address])

## 2022-02-24 NOTE — Discharge Instructions (Signed)
Dear Leanne Lovely,   Thank you for letting us participate in your care! In this section, you will find a brief hospital admission summary of why you were admitted to the hospital, what happened during your admission, your diagnosis/diagnoses, and recommended follow up.  Primary diagnosis: You were seen after an intentional overdose on insulin and Naproxen in a suicide attempt. Treatment plan: You were admitted to the ICU initially to stabilize your sugars and for monitoring. Psychiatry saw you and recommended inpatient behavioral health treatment.  POST-HOSPITAL & CARE INSTRUCTIONS We recommend following up with your PCP within 1 week from being discharged from the hospital. Please let PCP/Specialists know of any changes in medications that were made which you will be able to see in the medications section of this packet. Please also follow up with behavioral health for management of your psychiatric conditions.  DOCTOR'S APPOINTMENTS & FOLLOW UP No future appointments.   Thank you for choosing [facility]! Take care and be well!  [facility]  [place], [place] [address] [phone]

## 2022-02-25 DIAGNOSIS — F332 Major depressive disorder, recurrent severe without psychotic features: Secondary | ICD-10-CM

## 2022-02-25 LAB — GLUCOSE, CAPILLARY
Glucose-Capillary: 173 mg/dL — ABNORMAL HIGH (ref 70–99)
Glucose-Capillary: 202 mg/dL — ABNORMAL HIGH (ref 70–99)
Glucose-Capillary: 202 mg/dL — ABNORMAL HIGH (ref 70–99)
Glucose-Capillary: 144 mg/dL — ABNORMAL HIGH (ref 70–99)

## 2022-02-25 MED ORDER — FAMOTIDINE 20 MG PO TABS
20.0000 mg | ORAL_TABLET | Freq: Two times a day (BID) | ORAL | Status: DC
  Administered 2022-02-25 – 2022-02-26 (×2): 20 mg via ORAL
  Filled 2022-02-25 (×2): qty 1

## 2022-02-25 MED ORDER — LEVOTHYROXINE SODIUM 50 MCG PO TABS
300.0000 ug | ORAL_TABLET | Freq: Every day | ORAL | Status: DC
  Administered 2022-02-25 – 2022-02-26 (×2): 300 ug via ORAL
  Filled 2022-02-25 (×2): qty 6

## 2022-02-25 MED ORDER — METOPROLOL TARTRATE 25 MG PO TABS
25.0000 mg | ORAL_TABLET | Freq: Two times a day (BID) | ORAL | Status: DC
  Administered 2022-02-25 – 2022-02-26 (×2): 25 mg via ORAL
  Filled 2022-02-25 (×2): qty 1

## 2022-02-25 MED ORDER — FLUTICASONE FUROATE-VILANTEROL 100-25 MCG/ACT IN AEPB
1.0000 | INHALATION_SPRAY | Freq: Every day | RESPIRATORY_TRACT | Status: DC
  Administered 2022-02-25 – 2022-02-26 (×2): 1 via RESPIRATORY_TRACT
  Filled 2022-02-25: qty 28

## 2022-02-25 MED ORDER — MIRTAZAPINE 15 MG PO TABS
30.0000 mg | ORAL_TABLET | Freq: Every day | ORAL | Status: DC
  Administered 2022-02-25: 30 mg via ORAL
  Filled 2022-02-25: qty 2

## 2022-02-25 MED ORDER — INSULIN ASPART 100 UNIT/ML IJ SOLN
20.0000 [IU] | Freq: Every day | INTRAMUSCULAR | Status: DC
  Administered 2022-02-25: 20 [IU] via SUBCUTANEOUS
  Filled 2022-02-25: qty 1

## 2022-02-25 MED ORDER — ROSUVASTATIN CALCIUM 20 MG PO TABS
20.0000 mg | ORAL_TABLET | Freq: Every day | ORAL | Status: DC
  Administered 2022-02-25 – 2022-02-26 (×2): 20 mg via ORAL
  Filled 2022-02-25 (×2): qty 1

## 2022-02-25 MED ORDER — FLUOXETINE HCL 20 MG PO CAPS
80.0000 mg | ORAL_CAPSULE | Freq: Every day | ORAL | Status: DC
  Administered 2022-02-25 – 2022-02-26 (×2): 80 mg via ORAL
  Filled 2022-02-25 (×2): qty 4

## 2022-02-25 MED ORDER — INSULIN GLARGINE-YFGN 100 UNIT/ML ~~LOC~~ SOLN
65.0000 [IU] | Freq: Two times a day (BID) | SUBCUTANEOUS | Status: DC
  Administered 2022-02-25 – 2022-02-26 (×2): 65 [IU] via SUBCUTANEOUS
  Filled 2022-02-25 (×3): qty 0.65

## 2022-02-25 MED ORDER — FUROSEMIDE 40 MG PO TABS
40.0000 mg | ORAL_TABLET | Freq: Every day | ORAL | Status: DC
  Administered 2022-02-25 – 2022-02-26 (×2): 40 mg via ORAL
  Filled 2022-02-25 (×2): qty 1

## 2022-02-25 MED ORDER — DICYCLOMINE HCL 20 MG PO TABS: 20.0000 mg | ORAL_TABLET | Freq: Three times a day (TID) | ORAL | Status: DC | PRN

## 2022-02-25 MED ORDER — METFORMIN HCL 500 MG PO TABS
500.0000 mg | ORAL_TABLET | Freq: Two times a day (BID) | ORAL | Status: DC
  Administered 2022-02-25 – 2022-02-26 (×2): 500 mg via ORAL
  Filled 2022-02-25 (×2): qty 1

## 2022-02-25 MED ORDER — ISOSORB DINITRATE-HYDRALAZINE 20-37.5 MG PO TABS
1.0000 | ORAL_TABLET | Freq: Three times a day (TID) | ORAL | Status: DC
  Administered 2022-02-25 – 2022-02-26 (×4): 1 via ORAL
  Filled 2022-02-25 (×5): qty 1

## 2022-02-25 MED ORDER — INSULIN ASPART 100 UNIT/ML IJ SOLN
15.0000 [IU] | Freq: Every day | INTRAMUSCULAR | Status: DC
  Administered 2022-02-25 – 2022-02-26 (×2): 15 [IU] via SUBCUTANEOUS
  Filled 2022-02-25 (×2): qty 1

## 2022-02-25 MED ORDER — ALBUTEROL SULFATE HFA 108 (90 BASE) MCG/ACT IN AERS: 2.0000 | INHALATION_SPRAY | Freq: Four times a day (QID) | RESPIRATORY_TRACT | Status: DC | PRN

## 2022-02-25 NOTE — Group Note (Signed)
[facility] LCSW Group Therapy Note   Group Date: 02/25/2022 Start Time: 1300 End Time: 1400  Type of Therapy/Topic:  Group Therapy:  Feelings about Diagnosis  Participation Level:  Active     Description of Group:    This group will allow patients to explore their thoughts and feelings about diagnoses they have received. Patients will be guided to explore their level of understanding and acceptance of these diagnoses. Facilitator will encourage patients to process their thoughts and feelings about the reactions of others to their diagnosis, and will guide patients in identifying ways to discuss their diagnosis with significant others in their lives. This group will be process-oriented, with patients participating in exploration of their own experiences as well as giving and receiving support and challenge from other group members.   Therapeutic Goals: 1. Patient will demonstrate understanding of diagnosis as evidence by identifying two or more symptoms of the disorder:  2. Patient will be able to express two feelings regarding the diagnosis 3. Patient will demonstrate ability to communicate their needs through discussion and/or role plays  Summary of Patient Progress: Patient was present for the entirety of the group process. She defined diagnosis as a category assigned to an individual by a provider (doctor, etc.). Pt shared that she agrees with her diagnosis and just wants to live/cope with it better. She acknowledges that know her mental health diagnosis has had both positive and negative impacts on her. Pt shared that one negative aspect is fear of other people knowing her diagnosis and treating her differently. She appeared open and receptive to feedback/comments from both peers and facilitator.   Therapeutic Modalities:   Cognitive Behavioral Therapy Brief Therapy Feelings Identification    Shirl Harris, LCSW

## 2022-02-25 NOTE — Plan of Care (Signed)
D- Patient alert and oriented. Patient presented in a pleasant mood on assessment reporting that she slept good last night. Although patient endorsed right ankle pain on her self-inventory, she did not voice any complaints to this Probation officer. Patient also endorsed depression, anxiety and hopelessness, rating them all a "3/10", on her self-inventory, however, she did not mention any of this during assessment. Patient denied SI, HI, AVH at this time. Patient's reported goal for today is to "learn skills to help with my feelings when I'm sad", in which she will "talk with others during group session time", in order to achieve her goal.  A- Scheduled medications administered to patient, per MD orders. Support and encouragement provided.  Routine safety checks conducted every 15 minutes.  Patient informed to notify staff with problems or concerns.  R- No adverse drug reactions noted. Patient contracts for safety at this time. Patient compliant with medications and treatment plan. Patient receptive, calm, and cooperative. Patient interacts well with others on the unit. Patient remains safe at this time.  Problem: Education: Goal: Knowledge of General Education information will improve Description: Including pain rating scale, medication(s)/side effects and non-pharmacologic comfort measures Outcome: Progressing   Problem: Health Behavior/Discharge Planning: Goal: Ability to manage health-related needs will improve Outcome: Progressing   Problem: Clinical Measurements: Goal: Ability to maintain clinical measurements within normal limits will improve Outcome: Progressing Goal: Will remain free from infection Outcome: Progressing Goal: Diagnostic test results will improve Outcome: Progressing Goal: Respiratory complications will improve Outcome: Progressing Goal: Cardiovascular complication will be avoided Outcome: Progressing   Problem: Activity: Goal: Risk for activity intolerance will  decrease Outcome: Progressing   Problem: Nutrition: Goal: Adequate nutrition will be maintained Outcome: Progressing   Problem: Coping: Goal: Level of anxiety will decrease Outcome: Progressing   Problem: Elimination: Goal: Will not experience complications related to bowel motility Outcome: Progressing Goal: Will not experience complications related to urinary retention Outcome: Progressing   Problem: Pain Managment: Goal: General experience of comfort will improve Outcome: Progressing   Problem: Safety: Goal: Ability to remain free from injury will improve Outcome: Progressing   Problem: Skin Integrity: Goal: Risk for impaired skin integrity will decrease Outcome: Progressing   Problem: Education: Goal: Ability to make informed decisions regarding treatment will improve Outcome: Progressing   Problem: Coping: Goal: Coping ability will improve Outcome: Progressing   Problem: Health Behavior/Discharge Planning: Goal: Identification of resources available to assist in meeting health care needs will improve Outcome: Progressing   Problem: Medication: Goal: Compliance with prescribed medication regimen will improve Outcome: Progressing   Problem: Self-Concept: Goal: Ability to disclose and discuss suicidal ideas will improve Outcome: Progressing Goal: Will verbalize positive feelings about self Outcome: Progressing   Problem: Education: Goal: Utilization of techniques to improve thought processes will improve Outcome: Progressing Goal: Knowledge of the prescribed therapeutic regimen will improve Outcome: Progressing   Problem: Activity: Goal: Interest or engagement in leisure activities will improve Outcome: Progressing Goal: Imbalance in normal sleep/wake cycle will improve Outcome: Progressing   Problem: Coping: Goal: Coping ability will improve Outcome: Progressing Goal: Will verbalize feelings Outcome: Progressing   Problem: Health  Behavior/Discharge Planning: Goal: Ability to make decisions will improve Outcome: Progressing Goal: Compliance with therapeutic regimen will improve Outcome: Progressing   Problem: Role Relationship: Goal: Will demonstrate positive changes in social behaviors and relationships Outcome: Progressing   Problem: Safety: Goal: Ability to disclose and discuss suicidal ideas will improve Outcome: Progressing Goal: Ability to identify and utilize support systems that promote safety  will improve Outcome: Progressing   Problem: Self-Concept: Goal: Will verbalize positive feelings about self Outcome: Progressing Goal: Level of anxiety will decrease Outcome: Progressing

## 2022-02-25 NOTE — BHH Suicide Risk Assessment (Signed)
[facility] Admission Suicide Risk Assessment   Nursing information obtained from:  Patient Demographic factors:  Age 51 or older Current Mental Status:  Suicidal ideation indicated by others Loss Factors:  Loss of significant relationship Historical Factors:  Prior suicide attempts, Family history of mental illness or substance abuse, Anniversary of important loss Risk Reduction Factors:  Responsible for children under 80 years of age, Living with another person, especially a relative  Total Time spent with patient: 45 minutes Principal Problem: Severe recurrent major depression without psychotic features ([place]) Diagnosis:  Principal Problem:   Severe recurrent major depression without psychotic features ([place])  Subjective Data: 51 year old woman transferred to [name] from [place].  Admitted there 5 days ago with an intentional overdose of insulin.  Patient admits to the overdose saying that it was impulsive but came on top of a period of worsening depression and stress.  Currently the patient is denying any suicidal thought.  Denies psychosis.  States mood is feeling better.  Able to articulate multiple positive plans for the future.  Continued Clinical Symptoms:  Alcohol Use Disorder Identification Test Final Score (AUDIT): 0 The "Alcohol Use Disorders Identification Test", Guidelines for Use in Primary Care, Second Edition.  World [facility]). Score between 0-7:  no or low risk or alcohol related problems. Score between 8-15:  moderate risk of alcohol related problems. Score between 16-19:  high risk of alcohol related problems. Score 20 or above:  warrants further diagnostic evaluation for alcohol dependence and treatment.   CLINICAL FACTORS:   Severe Anxiety and/or Agitation Depression:   Impulsivity   Musculoskeletal: Strength & Muscle Tone: within normal limits Gait & Station: normal Patient leans: N/A  Psychiatric Specialty Exam:  Presentation  General Appearance:   Appropriate for Environment  Eye Contact: Good  Speech: Clear and Coherent  Speech Volume: Decreased  Handedness: Right   Mood and Affect  Mood: Depressed; Anxious  Affect: Congruent   Thought Process  Thought Processes: Coherent  Descriptions of Associations:Intact  Orientation:Full (Time, Place and Person)  Thought Content:Logical  History of Schizophrenia/Schizoaffective disorder:No data recorded Duration of Psychotic Symptoms:No data recorded Hallucinations:No data recorded Ideas of Reference:None  Suicidal Thoughts:No data recorded Homicidal Thoughts:No data recorded  Sensorium  Memory: Immediate Fair; Recent Fair; Remote Fair  Judgment: Fair  Insight: Lacking   Executive Functions  Concentration: Good  Attention Span: Good  Recall: [place] of Knowledge: Fair  Language: Fair   Psychomotor Activity  Psychomotor Activity:No data recorded  Assets  Assets: Desire for Improvement; Social Support   Sleep  Sleep:No data recorded   Physical Exam: Physical Exam Vitals and nursing note reviewed.  Constitutional:      Appearance: Normal appearance.  HENT:     Head: Normocephalic and atraumatic.     Mouth/Throat:     Pharynx: Oropharynx is clear.  Eyes:     Pupils: Pupils are equal, round, and reactive to light.  Cardiovascular:     Rate and Rhythm: Normal rate and regular rhythm.  Pulmonary:     Effort: Pulmonary effort is normal.     Breath sounds: Normal breath sounds.  Abdominal:     General: Abdomen is flat.     Palpations: Abdomen is soft.  Musculoskeletal:        General: Normal range of motion.  Skin:    General: Skin is warm and dry.  Neurological:     General: No focal deficit present.     Mental Status: She is alert. Mental status  is at baseline.  Psychiatric:        Attention and Perception: Attention normal.        Mood and Affect: Mood normal.        Speech: Speech normal.        Behavior:  Behavior normal.        Thought Content: Thought content normal.        Cognition and Memory: Cognition normal.        Judgment: Judgment normal.    Review of Systems  Constitutional: Negative.   HENT: Negative.    Eyes: Negative.   Respiratory: Negative.    Cardiovascular: Negative.   Gastrointestinal: Negative.   Musculoskeletal: Negative.   Skin: Negative.   Neurological: Negative.   Psychiatric/Behavioral: Negative.     Blood pressure (!) 157/93, pulse 94, temperature 98.1 F (36.7 C), temperature source Oral, resp. rate 20, height 5\' 11"  (1.803 m), weight (!) 176 kg, SpO2 99 %. Body mass index is 54.12 kg/m.   COGNITIVE FEATURES THAT CONTRIBUTE TO RISK:  None    SUICIDE RISK:   Minimal: No identifiable suicidal ideation.  Patients presenting with no risk factors but with morbid ruminations; may be classified as minimal risk based on the severity of the depressive symptoms  PLAN OF CARE: Continue 15-minute checks.  Restart appropriate outpatient medicine.  Psychoeducation about appropriate treatment for depression.  Including groups and evaluation on the unit.  Ongoing assessment of dangerousness prior to discharge  I certify that inpatient services furnished can reasonably be expected to improve the patient's condition.   Alethia Berthold, MD 02/25/2022, 11:13 AM

## 2022-02-25 NOTE — H&P (Addendum)
Psychiatric Admission Assessment Adult  Patient Identification: [name] MRN:  [id number] Date of Evaluation:  02/25/2022 Chief Complaint:  Severe recurrent major depression without psychotic features ([place]) [F33.2] Principal Diagnosis: Severe recurrent major depression without psychotic features ([place]) Diagnosis:  Principal Problem:   Severe recurrent major depression without psychotic features ([place]) Active Problems:   Hypothyroidism   Type II diabetes mellitus with complication ([place])   Anxiety   Obsessive-compulsive disorder   Essential hypertension, benign   Allergic rhinitis   Osteoarthritis  History of Present Illness: Patient seen and chart reviewed.  51 year old woman with a history of depression and anxiety.  Transferred to our facility from [place].  Patient presented to the emergency room in [facility] on December 14 after an intentional overdose with insulin.  Patient tells me she had been feeling sad and anxious more than usual recently.  She relates this to it being the anniversary of her deceased son's birthday as well as being the holiday season and several other anniversary is coming up.  Patient also acknowledges she was not taking her antidepressants or other medicines on a very reliable schedule.  Patient took a large overdose of nonsteroidal pain medicine and insulin.  Did it while family was not around.  When she woke up she called for medical help and has been cooperative since then.  Patient denies currently having any suicidal thoughts.  States that her mood is feeling better.  Expresses appreciative surprise that many people have spoken to her with support since the suicide attempt.  Denies any psychotic symptoms.  Denies alcohol or drug abuse.  Patient is married lives with husband and grown child and is employed in the school system.  Sounds like she has supportive family.  Multiple other medical problems including diabetes hypertension chronic joint pain  hypothyroidism. Associated Signs/Symptoms: Depression Symptoms:  depressed mood, fatigue, feelings of worthlessness/guilt, suicidal attempt, (Hypo) Manic Symptoms:  Impulsivity, Anxiety Symptoms:  Excessive Worry, Panic Symptoms, Obsessive Compulsive Symptoms:   Has struggled with both obsessions and compulsions at times, Psychotic Symptoms:   Denies any PTSD Symptoms: Negative Total Time spent with patient: 45 minutes  Past Psychiatric History: Patient has had 1 prior suicide attempt she acknowledges which was over 10 years ago.  Hospitalization at the time.  Follows up with a nurse practitioner in [place] for medication management.  Has been referred multiple times for therapy but has resisted it out of anxiety.  Is the patient at risk to self? Yes.    Has the patient been a risk to self in the past 6 months? No.  Has the patient been a risk to self within the distant past? Yes.    Is the patient a risk to others? No.  Has the patient been a risk to others in the past 6 months? No.  Has the patient been a risk to others within the distant past? No.   [place] Scale:  [place] Admission (Current) from 02/24/2022 in [place] ED to Hosp-Admission (Discharged) from 02/20/2022 in [place] ED to Hosp-Admission (Discharged) from 05/19/2021 in [facility] 4E CV SURGICAL PROGRESSIVE CARE  C-SSRS RISK CATEGORY High Risk High Risk No Risk        Prior Inpatient Therapy: Yes.   If yes, describe 10 years ago after a suicide attempt Prior Outpatient Therapy: Yes.   If yes, describe primarily goes for medication management and has reportedly been stable on dose of fluoxetine and mirtazapine for quite a  while.  Has resisted efforts to refer her for psychotherapy  Alcohol Screening: 1. How often do you have a drink containing alcohol?: Never 2. How many drinks containing alcohol do you have on a typical day when you are drinking?:  1 or 2 3. How often do you have six or more drinks on one occasion?: Never AUDIT-C Score: 0 4. How often during the last year have you found that you were not able to stop drinking once you had started?: Never 5. How often during the last year have you failed to do what was normally expected from you because of drinking?: Never 6. How often during the last year have you needed a first drink in the morning to get yourself going after a heavy drinking session?: Never 7. How often during the last year have you had a feeling of guilt of remorse after drinking?: Never 8. How often during the last year have you been unable to remember what happened the night before because you had been drinking?: Never 9. Have you or someone else been injured as a result of your drinking?: No 10. Has a relative or friend or a doctor or another health worker been concerned about your drinking or suggested you cut down?: No Alcohol Use Disorder Identification Test Final Score (AUDIT): 0 Alcohol Brief Interventions/Follow-up: Patient Refused (patient stated she does not drink) Substance Abuse History in the last 12 months:  No. Consequences of Substance Abuse: Negative Previous Psychotropic Medications: Yes  Psychological Evaluations: Yes  Past Medical History:  Past Medical History:  Diagnosis Date   Acute renal failure ([place]) 05/27/10   hemodialysis for 6 weeks   Anxiety    Asthma    Depression    Diabetes mellitus    Hypertension    Rhabdomyolysis 06/06/10   after drug overdose   Suicide attempt by drug ingestion ([place]) 06/05/10   result rhabdomyolosis and ARF requrining dialysis     Past Surgical History:  Procedure Laterality Date   ANKLE ARTHROSCOPY Right 08/17/2015   Procedure: RIGHT ANKLE ARTHROSCOPY WITH SYNOVECTOMY AND LOOSE BODY EXCISION;  Surgeon: Melrose Nakayama, MD;  Location: [place];  Service: Orthopedics;  Laterality: Right;   CHOLECYSTECTOMY     TUBAL LIGATION  1998   Family History:  Family  History  Problem Relation Age of Onset   Asthma Father    Family Psychiatric  History: Denies Tobacco Screening:  Social History   Tobacco Use  Smoking Status Never   Passive exposure: Never  Smokeless Tobacco Never    BH Tobacco Counseling     Are you interested in Tobacco Cessation Medications?  N/A, patient does not use tobacco products Counseled patient on smoking cessation:  N/A, patient does not use tobacco products Reason Tobacco Screening Not Completed: No value filed.       Social History:  Social History   Substance and Sexual Activity  Alcohol Use No     Social History   Substance and Sexual Activity  Drug Use No    Additional Social History: Marital status: Married Number of Years Married: 61 (It will be 31 years on 03/06/22.) What types of issues is patient dealing with in the relationship?: "My husband's a drinker sometimes can be a mean drinker." Are you sexually active?:  (Unable to assess) What is your sexual orientation?: Hetersexual Has your sexual activity been affected by drugs, alcohol, medication, or emotional stress?: N/A Does patient have children?: Yes How many children?: 95 (A 42 year old son and a deceased  son.) How is patient's relationship with their children?: "My 73 year old sone is driving me crazy, can't keep a job, smoking weed all the time."                         Allergies:   Allergies  Allergen Reactions   Ace Inhibitors Other (See Comments)    Patient had acute renal failure requiring hemodialysis after a suicide attempt.  Nephro recommended avoiding use.   Haldol [Haloperidol Lactate] Other (See Comments)    Tardive dyskinesia   Nsaids Other (See Comments)    Nephro recommended avoiding after acute renal failure requiring hemodialysis associated with a suicide attempt.   Entresto [Sacubitril-Valsartan] Itching   Latex Other (See Comments)    Rash around IV site   Lab Results:  Results for orders placed or  performed during the hospital encounter of 02/24/22 (from the past 48 hour(s))  Glucose, capillary     Status: Abnormal   Collection Time: 02/24/22  7:29 PM  Result Value Ref Range   Glucose-Capillary 204 (H) 70 - 99 mg/dL    Comment: Glucose reference range applies only to samples taken after fasting for at least 8 hours.  Glucose, capillary     Status: Abnormal   Collection Time: 02/25/22  6:54 AM  Result Value Ref Range   Glucose-Capillary 173 (H) 70 - 99 mg/dL    Comment: Glucose reference range applies only to samples taken after fasting for at least 8 hours.    Blood Alcohol level:  Lab Results  Component Value Date   ETH <10 02/20/2022   ETH <10 93/71/6967    Metabolic Disorder Labs:  Lab Results  Component Value Date   HGBA1C 9.7 (A) 12/24/2021   MPG 243.17 11/14/2017   MPG 186 05/09/2015   No results found for: "PROLACTIN" Lab Results  Component Value Date   CHOL 201 (H) 04/15/2021   TRIG 231 (H) 04/15/2021   HDL 49 04/15/2021   CHOLHDL 4.1 04/15/2021   VLDL 46 (H) 04/10/2015   LDLCALC 112 (H) 04/15/2021   LDLCALC 125 (H) 12/19/2020    Current Medications: Current Facility-Administered Medications  Medication Dose Route Frequency Provider Last Rate Last Admin   acetaminophen (TYLENOL) tablet 650 mg  650 mg Oral Q6H PRN Jesse Hirst T, MD       albuterol (VENTOLIN HFA) 108 (90 Base) MCG/ACT inhaler 2 puff  2 puff Inhalation Q6H PRN Elan Brainerd T, MD       alum & mag hydroxide-simeth (MAALOX/MYLANTA) 200-200-20 MG/5ML suspension 30 mL  30 mL Oral Q4H PRN Laurey Salser, [name], MD       dicyclomine (BENTYL) tablet 20 mg  20 mg Oral TID PRN Wlliam Grosso, [name], MD       famotidine (PEPCID) tablet 20 mg  20 mg Oral BID Deysy Schabel T, MD       FLUoxetine (PROZAC) capsule 80 mg  80 mg Oral Daily Petr Bontempo T, MD       fluticasone furoate-vilanterol (BREO ELLIPTA) 100-25 MCG/ACT 1 puff  1 puff Inhalation Daily Yukiko Minnich T, MD       furosemide (LASIX) tablet 40 mg   40 mg Oral Daily Jary Louvier T, MD       hydrOXYzine (ATARAX) tablet 50 mg  50 mg Oral TID PRN Sabrina Keough, [name], MD   50 mg at 02/24/22 2110   insulin aspart (novoLOG) injection 15 Units  15 Units Subcutaneous QAC lunch Breena Bevacqua T,  MD       insulin aspart (novoLOG) injection 20 Units  20 Units Subcutaneous QAC supper Mackenzie Lia T, MD       insulin glargine-yfgn (SEMGLEE) injection 65 Units  65 Units Subcutaneous BID Neelam Tiggs T, MD       isosorbide-hydrALAZINE (BIDIL) 20-37.5 MG per tablet 1 tablet  1 tablet Oral TID Anacleto Batterman, [name], MD       levothyroxine (SYNTHROID) tablet 300 mcg  300 mcg Oral Q0600 Ryeleigh Santore, [name], MD       magnesium hydroxide (MILK OF MAGNESIA) suspension 30 mL  30 mL Oral Daily PRN Ramonica Grigg, [name], MD       metFORMIN (GLUCOPHAGE) tablet 500 mg  500 mg Oral BID WC Yuriana Gaal, [name], MD       metoprolol tartrate (LOPRESSOR) tablet 25 mg  25 mg Oral BID Obryan Radu T, MD       mirtazapine (REMERON) tablet 30 mg  30 mg Oral QHS Catheline Hixon T, MD       rosuvastatin (CRESTOR) tablet 20 mg  20 mg Oral Daily Dak Szumski T, MD       traZODone (DESYREL) tablet 100 mg  100 mg Oral QHS Caroline Sauger, NP   100 mg at 02/24/22 2208   PTA Medications: Medications Prior to Admission  Medication Sig Dispense Refill Last Dose   albuterol (PROVENTIL) (2.5 MG/3ML) 0.083% nebulizer solution Take 3 mLs by nebulization 3 (three) times daily as needed. 360 mL 0    albuterol (VENTOLIN HFA) 108 (90 Base) MCG/ACT inhaler Inhale 2 puffs into the lungs every 4 (four) hours as needed for wheezing or shortness of breath. 18 g 0    blood glucose meter kit and supplies KIT Dispense based on patient and insurance preference. Use up to four times daily as directed. 1 each 0    dicyclomine (BENTYL) 20 MG tablet Take 1 tablet (20 mg total) by mouth 3 (three) times daily as needed for spasms (abdominal pain).      famotidine (PEPCID) 20 MG tablet Take 20 mg by mouth 2 (two) times daily.       FLUoxetine (PROZAC) 40 MG capsule Take 1 capsule (40 mg total) by mouth daily. 30 capsule 0    fluticasone (FLONASE) 50 MCG/ACT nasal spray Place 1 spray into both nostrils daily. 1 spray in each nostril every day 16 g 1    fluticasone furoate-vilanterol (BREO ELLIPTA) 200-25 MCG/ACT AEPB Inhale 1 puff into the lungs daily. 1 each 0    furosemide (LASIX) 40 MG tablet Take 1 tablet (40 mg total) by mouth daily. 30 tablet 0    insulin aspart (NOVOLOG) 100 UNIT/ML FlexPen Inject 10 units with Lunch and 15 units with Dinner (Patient taking differently: 15-20 Units in the morning and at bedtime. Inject 15 units with Lunch and 20 units with Dinner) 15 mL 11    Insulin Glargine (BASAGLAR KWIKPEN) 100 UNIT/ML Inject 10 Units into the skin daily. Take 65U twice a day 15 mL 6    isosorbide-hydrALAZINE (BIDIL) 20-37.5 MG tablet Take 1 tablet by mouth 3 (three) times daily. 90 tablet 3    levothyroxine (SYNTHROID) 300 MCG tablet Take 1 tablet (300 mcg total) by mouth daily before breakfast. 90 tablet 0    metFORMIN (GLUCOPHAGE-XR) 750 MG 24 hr tablet Take 1 tablet (750 mg total) by mouth 2 (two) times daily with a meal.      metoprolol tartrate (LOPRESSOR) 25 MG tablet Take 1 tablet (25  mg total) by mouth 2 (two) times daily. 60 tablet 3    mirtazapine (REMERON) 30 MG tablet Take 1 tablet (30 mg total) by mouth daily. 30 tablet 3    rosuvastatin (CRESTOR) 20 MG tablet Take 1 tablet (20 mg total) by mouth daily. 90 tablet 3    Semaglutide, 1 MG/DOSE, 4 MG/3ML SOPN Inject 1 mg into the skin once a week. (Patient taking differently: Inject 1 mg into the skin once a week. Sunday night) 3 mL 1    Vitamin D, Ergocalciferol, (DRISDOL) 50000 units CAPS capsule Take 1 capsule (50,000 Units total) by mouth every 7 (seven) days. Sunday 12 capsule 0     Musculoskeletal: Strength & Muscle Tone: within normal limits Gait & Station: broad based Patient leans: N/A            Psychiatric Specialty  Exam:  Presentation  General Appearance:  Appropriate for Environment  Eye Contact: Good  Speech: Clear and Coherent  Speech Volume: Decreased  Handedness: Right   Mood and Affect  Mood: Depressed; Anxious  Affect: Congruent   Thought Process  Thought Processes: Coherent  Duration of Psychotic Symptoms: Chronic years Past Diagnosis of Schizophrenia or Psychoactive disorder: No data recorded Descriptions of Associations:Intact  Orientation:Full (Time, Place and Person)  Thought Content:Logical  Hallucinations:No data recorded Ideas of Reference:None  Suicidal Thoughts:No data recorded Homicidal Thoughts:No data recorded  Sensorium  Memory: Immediate Fair; Recent Fair; Remote Fair  Judgment: Fair  Insight: Lacking   Executive Functions  Concentration: Good  Attention Span: Good  Recall: [place] of Knowledge: Fair  Language: Fair   Psychomotor Activity  Psychomotor Activity:No data recorded  Assets  Assets: Desire for Improvement; Social Support   Sleep  Sleep:No data recorded   Physical Exam: Physical Exam Vitals reviewed.  Constitutional:      Appearance: Normal appearance.  HENT:     Head: Normocephalic and atraumatic.     Mouth/Throat:     Pharynx: Oropharynx is clear.  Eyes:     Pupils: Pupils are equal, round, and reactive to light.  Cardiovascular:     Rate and Rhythm: Normal rate and regular rhythm.  Pulmonary:     Effort: Pulmonary effort is normal.     Breath sounds: Normal breath sounds.  Abdominal:     General: Abdomen is flat.     Palpations: Abdomen is soft.  Musculoskeletal:        General: Normal range of motion.  Skin:    General: Skin is warm and dry.  Neurological:     General: No focal deficit present.     Mental Status: She is alert. Mental status is at baseline.  Psychiatric:        Mood and Affect: Mood normal.        Thought Content: Thought content normal.    Review of Systems   Constitutional: Negative.   HENT: Negative.    Eyes: Negative.   Respiratory: Negative.    Cardiovascular: Negative.   Gastrointestinal: Negative.   Musculoskeletal: Negative.   Skin: Negative.   Neurological: Negative.   Psychiatric/Behavioral: Negative.     Blood pressure (!) 157/93, pulse 94, temperature 98.1 F (36.7 C), temperature source Oral, resp. rate 20, height _0  (1.803 m), weight (!) 176 kg, SpO2 99 %. Body mass index is 54.12 kg/m.  Treatment Plan Summary: Medication management and Plan reviewed medications with the patient.  Restart medications as listed in her chart after the review including both antidepressants and  medical medications.  Reviewed with her the importance of staying compliant with medicines and techniques for doing so.  Discussed with her a strong recommendation that she engage in psychotherapy.  Psychoeducation and supportive counseling completed.  Case reviewed with current treatment team.  Reevaluate tomorrow possible discharge by then.  Observation Level/Precautions:  15 minute checks  Laboratory:  Chemistry Profile  Psychotherapy:    Medications:    Consultations:    Discharge Concerns:    Estimated LOS:  Other:     Physician Treatment Plan for Primary Diagnosis: Severe recurrent major depression without psychotic features ([place]) Long Term Goal(s): Improvement in symptoms so as ready for discharge  Short Term Goals: Ability to verbalize feelings will improve, Ability to disclose and discuss suicidal ideas, and Ability to demonstrate self-control will improve  Physician Treatment Plan for Secondary Diagnosis: Principal Problem:   Severe recurrent major depression without psychotic features ([place]) Active Problems:   Hypothyroidism   Type II diabetes mellitus with complication ([place])   Anxiety   Obsessive-compulsive disorder   Essential hypertension, benign   Allergic rhinitis   Osteoarthritis  Long Term Goal(s): Improvement in symptoms  so as ready for discharge  Short Term Goals: Ability to maintain clinical measurements within normal limits will improve and Compliance with prescribed medications will improve  I certify that inpatient services furnished can reasonably be expected to improve the patient's condition.    Alethia Berthold, MD 12/19/202311:16 AM

## 2022-02-25 NOTE — Progress Notes (Signed)
Recreation Therapy Notes   Date: 02/25/2022  Time: 10:05 am    Location: Craft room     Behavioral response: N/A   Intervention Topic: Teamwork    Discussion/Intervention: Patient refused to attend group.   Clinical Observations/Feedback:  Patient refused to attend group.    Jannie Doyle LRT/CTRS        Yaslyn Cumby 02/25/2022 11:09 AM 
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
## 2022-02-25 NOTE — [facility] Group Notes (Signed)
[facility] Group Notes:  (Nursing/MHT/Case Management/Adjunct)  Date:  02/25/2022  Time:  9:16 PM  Type of Therapy:  Group Therapy  Participation Level:  Active  Participation Quality:  Appropriate  Affect:  Excited  Cognitive:  Alert, Appropriate, and Oriented  Insight:  Appropriate  Engagement in Group:  Engaged  Modes of Intervention:  Discussion  Summary of Progress/Problems:  [name],Samantha Frede E 02/25/2022, 9:16 PM

## 2022-02-25 NOTE — [facility] Suicide Risk Assessment (Signed)
[facility] INPATIENT:  Family/Significant Other Suicide Prevention Education  Suicide Prevention Education:  Patient Refusal for Family/Significant Other Suicide Prevention Education: The patient [name] has refused to provide written consent for family/significant other to be provided Family/Significant Other Suicide Prevention Education during admission and/or prior to discharge.  Physician notified.  SPE completed with pt, as pt refused to consent to family contact. SPI pamphlet provided to pt and pt was encouraged to share information with support network, ask questions, and talk about any concerns relating to SPE. Pt denies access to guns/firearms and verbalized understanding of information provided. Mobile Crisis information also provided to pt.  Shirl Harris 02/25/2022, 2:05 PM
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
## 2022-02-26 LAB — GLUCOSE, CAPILLARY
Glucose-Capillary: 146 mg/dL — ABNORMAL HIGH (ref 70–99)
Glucose-Capillary: 213 mg/dL — ABNORMAL HIGH (ref 70–99)

## 2022-02-26 MED ORDER — HYDROXYZINE HCL 50 MG PO TABS: 50.0000 mg | ORAL_TABLET | Freq: Three times a day (TID) | ORAL | 0 refills | Status: DC | PRN

## 2022-02-26 MED ORDER — FUROSEMIDE 40 MG PO TABS: 40.0000 mg | ORAL_TABLET | Freq: Every day | ORAL | 0 refills | Status: DC

## 2022-02-26 MED ORDER — VITAMIN D (ERGOCALCIFEROL) 1.25 MG (50000 UNIT) PO CAPS: 50000.0000 [IU] | ORAL_CAPSULE | ORAL | 0 refills | Status: DC

## 2022-02-26 MED ORDER — BASAGLAR KWIKPEN 100 UNIT/ML ~~LOC~~ SOPN: 10.0000 [IU] | PEN_INJECTOR | Freq: Every day | SUBCUTANEOUS | 0 refills | Status: DC

## 2022-02-26 MED ORDER — SEMAGLUTIDE (1 MG/DOSE) 4 MG/3ML ~~LOC~~ SOPN: 1.0000 mg | PEN_INJECTOR | SUBCUTANEOUS | 0 refills | Status: DC

## 2022-02-26 MED ORDER — LEVOTHYROXINE SODIUM 300 MCG PO TABS: 300.0000 ug | ORAL_TABLET | Freq: Every day | ORAL | 0 refills | Status: DC

## 2022-02-26 MED ORDER — FAMOTIDINE 20 MG PO TABS: 20.0000 mg | ORAL_TABLET | Freq: Two times a day (BID) | ORAL | 0 refills | Status: DC

## 2022-02-26 MED ORDER — MIRTAZAPINE 30 MG PO TABS: 30.0000 mg | ORAL_TABLET | Freq: Every day | ORAL | 0 refills | Status: DC

## 2022-02-26 MED ORDER — ISOSORB DINITRATE-HYDRALAZINE 20-37.5 MG PO TABS: 1.0000 | ORAL_TABLET | Freq: Three times a day (TID) | ORAL | 0 refills | Status: DC

## 2022-02-26 MED ORDER — ALBUTEROL SULFATE HFA 108 (90 BASE) MCG/ACT IN AERS: 2.0000 | INHALATION_SPRAY | Freq: Four times a day (QID) | RESPIRATORY_TRACT | 1 refills | Status: DC | PRN

## 2022-02-26 MED ORDER — INSULIN ASPART 100 UNIT/ML FLEXPEN: 15.0000 [IU] | PEN_INJECTOR | Freq: Two times a day (BID) | SUBCUTANEOUS | 1 refills | Status: DC

## 2022-02-26 MED ORDER — INSULIN ASPART 100 UNIT/ML IJ SOLN: 15.0000 [IU] | Freq: Every day | INTRAMUSCULAR | 1 refills | Status: DC

## 2022-02-26 MED ORDER — TRAZODONE HCL 100 MG PO TABS: 100.0000 mg | ORAL_TABLET | Freq: Every day | ORAL | 0 refills | Status: DC

## 2022-02-26 MED ORDER — FLUOXETINE HCL 40 MG PO CAPS: 80.0000 mg | ORAL_CAPSULE | Freq: Every day | ORAL | 0 refills | Status: DC

## 2022-02-26 MED ORDER — METFORMIN HCL 500 MG PO TABS: 500.0000 mg | ORAL_TABLET | Freq: Two times a day (BID) | ORAL | 0 refills | Status: DC

## 2022-02-26 MED ORDER — ROSUVASTATIN CALCIUM 20 MG PO TABS: 20.0000 mg | ORAL_TABLET | Freq: Every day | ORAL | 0 refills | Status: DC

## 2022-02-26 MED ORDER — DICYCLOMINE HCL 20 MG PO TABS: 20.0000 mg | ORAL_TABLET | Freq: Three times a day (TID) | ORAL | 0 refills | Status: DC | PRN

## 2022-02-26 MED ORDER — METOPROLOL TARTRATE 25 MG PO TABS: 25.0000 mg | ORAL_TABLET | Freq: Two times a day (BID) | ORAL | 0 refills | Status: DC

## 2022-02-26 NOTE — Discharge Summary (Signed)
Physician Discharge Summary Note  Patient:  [name] is an 51 y.o., female MRN:  [id number] DOB:  [birth year] Patient phone:  [phone] (home)  Patient address:   [address] [place] [address],  Total Time spent with patient: 30 minutes  Date of Admission:  02/24/2022 Date of Discharge: 02/26/2022  Reason for Admission: Patient was transferred from [facility] where she had been stabilized for an intentional overdose of insulin done with suicidal intent.  Principal Problem: Severe recurrent major depression without psychotic features [facility]) Discharge Diagnoses: Principal Problem:   Severe recurrent major depression without psychotic features ([place]) Active Problems:   Hypothyroidism   Type II diabetes mellitus with complication ([place])   Anxiety   Obsessive-compulsive disorder   Essential hypertension, benign   Allergic rhinitis   Osteoarthritis   Past Psychiatric History: History of chronic depression and anxiety  Past Medical History:  Past Medical History:  Diagnosis Date   Acute renal failure ([place]) 05/27/10   hemodialysis for 6 weeks   Anxiety    Asthma    Depression    Diabetes mellitus    Hypertension    Rhabdomyolysis 06/06/10   after drug overdose   Suicide attempt by drug ingestion ([place]) 06/05/10   result rhabdomyolosis and ARF requrining dialysis     Past Surgical History:  Procedure Laterality Date   ANKLE ARTHROSCOPY Right 08/17/2015   Procedure: RIGHT ANKLE ARTHROSCOPY WITH SYNOVECTOMY AND LOOSE BODY EXCISION;  Surgeon: Melrose Nakayama, MD;  Location: [place];  Service: Orthopedics;  Laterality: Right;   CHOLECYSTECTOMY     TUBAL LIGATION  1998   Family History:  Family History  Problem Relation Age of Onset   Asthma Father    Family Psychiatric  History: See previous Social History:  Social History   Substance and Sexual Activity  Alcohol Use No     Social History   Substance and Sexual Activity  Drug Use No    Social History    Socioeconomic History   Marital status: Married    Spouse name: Not on file   Number of children: 2   Years of education: Not on file   Highest education level: Not on file  Occupational History   Occupation: Corporate treasurer  Tobacco Use   Smoking status: Never    Passive exposure: Never   Smokeless tobacco: Never  Vaping Use   Vaping Use: Never used  Substance and Sexual Activity   Alcohol use: No   Drug use: No   Sexual activity: Yes    Comment: with husband   Other Topics Concern   Not on file  Social History Narrative   Married high school boyfriend at age 51, two boys, still married but he has been living with other women for years.   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  15-minute checks maintained.  Patient did not display any dangerous or aggressive behavior.  She showed good insight and was cooperative with evaluation by the full treatment team.  Patient reported that she no longer had suicidal thoughts and was able to articulate multiple things she was grateful for including work friends and social friends and her family.  Expressed that she felt like she was much more stable than she was previously.  She was open to suggestions that she improve her compliance with medication.  Multiple team members strongly encouraged her to accept  recommendations for psychotherapy.  Patient says she is now willing to consider this.  Message had been sent to her primary prescriber about the updated situation.  Patient will be discharged today with her current medication and intact for follow-up with Ms. Ronne Binning and recommendation that she be referred to therapy.  Physical Findings: AIMS: Facial and Oral Movements Muscles of Facial Expression: None, normal Lips and Perioral Area: None, normal Jaw: None, normal Tongue: None, normal,Extremity Movements Upper (arms, wrists, hands, fingers): None, normal Lower (legs, knees, ankles, toes): None, normal, Trunk Movements Neck, shoulders, hips: None, normal, Overall Severity Severity of abnormal movements (highest score from questions above): None, normal Incapacitation due to abnormal movements: None, normal Patient's awareness of abnormal movements (rate only patient's report): No Awareness, Dental Status Current problems with teeth and/or dentures?: No Does patient usually wear dentures?: No  CIWA:    COWS:     Musculoskeletal: Strength & Muscle Tone: within normal limits Gait & Station: normal Patient leans: N/A   Psychiatric Specialty Exam:  Presentation  General Appearance:  Appropriate for Environment  Eye Contact: Good  Speech: Clear and Coherent  Speech Volume: Decreased  Handedness: Right   Mood and Affect  Mood: Depressed; Anxious  Affect: Congruent   Thought Process  Thought Processes: Coherent  Descriptions of Associations:Intact  Orientation:Full (Time, Place and Person)  Thought Content:Logical  History of Schizophrenia/Schizoaffective disorder:No data recorded Duration of Psychotic Symptoms:No data recorded Hallucinations:No data recorded Ideas of Reference:None  Suicidal Thoughts:No data recorded Homicidal Thoughts:No data recorded  Sensorium  Memory: Immediate Fair; Recent Fair; Remote  Fair  Judgment: Fair  Insight: Lacking   Executive Functions  Concentration: Good  Attention Span: Good  Recall: [place] of Knowledge: Fair  Language: Fair   Psychomotor Activity  Psychomotor Activity:No data recorded  Assets  Assets: Desire for Improvement; Social Support   Sleep  Sleep:No data recorded   Physical Exam: Physical Exam Vitals and nursing note reviewed.  Constitutional:      Appearance: Normal appearance.  HENT:     Head: Normocephalic and atraumatic.     Mouth/Throat:     Pharynx: Oropharynx is clear.  Eyes:     Pupils: Pupils are equal, round, and reactive to light.  Cardiovascular:     Rate and Rhythm: Normal rate and regular rhythm.  Pulmonary:     Effort: Pulmonary effort is normal.     Breath sounds: Normal breath sounds.  Abdominal:     General: Abdomen is flat.     Palpations: Abdomen is soft.  Musculoskeletal:        General: Normal range of motion.  Skin:    General: Skin is warm and dry.  Neurological:     General: No focal deficit present.     Mental Status: She is alert. Mental status is at baseline.  Psychiatric:        Attention  and Perception: Attention normal.        Mood and Affect: Mood normal.        Speech: Speech normal.        Behavior: Behavior normal.        Thought Content: Thought content normal.        Cognition and Memory: Cognition normal.        Judgment: Judgment normal.    Review of Systems  Constitutional: Negative.   HENT: Negative.    Eyes: Negative.   Respiratory: Negative.    Cardiovascular: Negative.   Gastrointestinal: Negative.   Musculoskeletal: Negative.   Skin: Negative.   Neurological: Negative.   Psychiatric/Behavioral: Negative.     Blood pressure (!) 159/95, pulse 93, temperature 98.8 F (37.1 C), temperature source Oral, resp. rate 20, height _0  (1.803 m), weight (!) 176 kg, SpO2 99 %. Body mass index is 54.12 kg/m.   Social History   Tobacco Use   Smoking Status Never   Passive exposure: Never  Smokeless Tobacco Never   Tobacco Cessation:  N/A, patient does not currently use tobacco products   Blood Alcohol level:  Lab Results  Component Value Date   ETH <10 02/20/2022   ETH <10 81/77/1165    Metabolic Disorder Labs:  Lab Results  Component Value Date   HGBA1C 9.7 (A) 12/24/2021   MPG 243.17 11/14/2017   MPG 186 05/09/2015   No results found for: "PROLACTIN" Lab Results  Component Value Date   CHOL 201 (H) 04/15/2021   TRIG 231 (H) 04/15/2021   HDL 49 04/15/2021   CHOLHDL 4.1 04/15/2021   VLDL 46 (H) 04/10/2015   LDLCALC 112 (H) 04/15/2021   LDLCALC 125 (H) 12/19/2020    See Psychiatric Specialty Exam and Suicide Risk Assessment completed by Attending Physician prior to discharge.  Discharge destination:  Home  Is patient on multiple antipsychotic therapies at discharge:  No   Has Patient had three or more failed trials of antipsychotic monotherapy by history:  No  Recommended Plan for Multiple Antipsychotic Therapies: NA  Discharge Instructions     Diet - low sodium heart healthy   Complete by: As directed    Increase activity slowly   Complete by: As directed       Allergies as of 02/26/2022       Reactions   Ace Inhibitors Other (See Comments)   Patient had acute renal failure requiring hemodialysis after a suicide attempt.  Nephro recommended avoiding use.   Haldol [haloperidol Lactate] Other (See Comments)   Tardive dyskinesia   Nsaids Other (See Comments)   Nephro recommended avoiding after acute renal failure requiring hemodialysis associated with a suicide attempt.   Entresto [sacubitril-valsartan] Itching   Latex Other (See Comments)   Rash around IV site        Medication List     STOP taking these medications    metFORMIN 750 MG 24 hr tablet Commonly known as: GLUCOPHAGE-XR Replaced by: metFORMIN 500 MG tablet       TAKE these medications      Indication  albuterol  108 (90 Base) MCG/ACT inhaler Commonly known as: VENTOLIN HFA Inhale 2 puffs into the lungs every 6 (six) hours as needed for shortness of breath. What changed:  when to take this reasons to take this Another medication with the same name was removed. Continue taking this medication, and follow the directions you see here.  Indication: Asthma   Basaglar KwikPen 100 UNIT/ML Inject 10 Units  into the skin daily. Take 65U twice a day  Indication: Type 2 Diabetes   blood glucose meter kit and supplies Kit Dispense based on patient and insurance preference. Use up to four times daily as directed.  Indication: Diabetes   dicyclomine 20 MG tablet Commonly known as: BENTYL Take 1 tablet (20 mg total) by mouth 3 (three) times daily as needed for spasms (abdominal pain).  Indication: Irritable Bowel Syndrome   famotidine 20 MG tablet Commonly known as: PEPCID Take 1 tablet (20 mg total) by mouth 2 (two) times daily.  Indication: Gastroesophageal Reflux Disease   FLUoxetine 40 MG capsule Commonly known as: PROZAC Take 2 capsules (80 mg total) by mouth daily. Start taking on: February 27, 2022 What changed: how much to take  Indication: Depression, Generalized Anxiety Disorder, Panic Disorder   fluticasone 50 MCG/ACT nasal spray Commonly known as: FLONASE Place 1 spray into both nostrils daily. 1 spray in each nostril every day  Indication: Allergic Rhinitis   fluticasone furoate-vilanterol 200-25 MCG/ACT Aepb Commonly known as: Breo Ellipta Inhale 1 puff into the lungs daily.  Indication: Asthma   furosemide 40 MG tablet Commonly known as: LASIX Take 1 tablet (40 mg total) by mouth daily.  Indication: High Blood Pressure Disorder   hydrOXYzine 50 MG tablet Commonly known as: ATARAX Take 1 tablet (50 mg total) by mouth 3 (three) times daily as needed for anxiety.  Indication: Feeling Anxious   insulin aspart 100 UNIT/ML FlexPen Commonly known as: NOVOLOG Inject 15-20 Units  into the skin in the morning and at bedtime. Inject 15 units with Lunch and 20 units with Dinner What changed:  how much to take how to take this when to take this additional instructions  Indication: Type 2 Diabetes   insulin aspart 100 UNIT/ML injection Commonly known as: novoLOG Inject 15 Units into the skin daily before lunch. Also 20 units into the skin before supper What changed: You were already taking a medication with the same name, and this prescription was added. Make sure you understand how and when to take each.  Indication: Type 2 Diabetes   isosorbide-hydrALAZINE 20-37.5 MG tablet Commonly known as: BIDIL Take 1 tablet by mouth 3 (three) times daily.  Indication: Cardiac Failure   levothyroxine 300 MCG tablet Commonly known as: SYNTHROID Take 1 tablet (300 mcg total) by mouth daily at 6 (six) AM. Start taking on: February 27, 2022 What changed: when to take this  Indication: Underactive Thyroid   metFORMIN 500 MG tablet Commonly known as: GLUCOPHAGE Take 1 tablet (500 mg total) by mouth 2 (two) times daily with a meal. Replaces: metFORMIN 750 MG 24 hr tablet  Indication: Type 2 Diabetes   metoprolol tartrate 25 MG tablet Commonly known as: LOPRESSOR Take 1 tablet (25 mg total) by mouth 2 (two) times daily.  Indication: High Blood Pressure Disorder   mirtazapine 30 MG tablet Commonly known as: REMERON Take 1 tablet (30 mg total) by mouth at bedtime. What changed: when to take this  Indication: Major Depressive Disorder, Panic Disorder   rosuvastatin 20 MG tablet Commonly known as: Crestor Take 1 tablet (20 mg total) by mouth daily.  Indication: Elevation of Both Cholesterol and Triglycerides in Blood   Semaglutide (1 MG/DOSE) 4 MG/3ML Sopn Inject 1 mg into the skin once a week. What changed: additional instructions  Indication: Type 2 Diabetes   traZODone 100 MG tablet Commonly known as: DESYREL Take 1 tablet (100 mg total) by mouth at bedtime.  Indication: Trouble Sleeping   Vitamin D (Ergocalciferol) 1.25 MG (50000 UNIT) Caps capsule Commonly known as: DRISDOL Take 1 capsule (50,000 Units total) by mouth every 7 (seven) days. Sunday  Indication: Vitamin D Deficiency         Follow-up recommendations:  Other:  Follow-up with outpatient medical and psychiatric treatment as well as strongly encourage follow-up with therapy  Comments: Prescriptions provided  Signed: Alethia Berthold, MD 02/26/2022, 10:05 AM

## 2022-02-26 NOTE — Plan of Care (Signed)
  Problem: Education: Goal: Knowledge of General Education information will improve Description: Including pain rating scale, medication(s)/side effects and non-pharmacologic comfort measures Outcome: Adequate for Discharge   Problem: Coping: Goal: Level of anxiety will decrease Outcome: Adequate for Discharge   Problem: Coping: Goal: Coping ability will improve Outcome: Adequate for Discharge   Problem: Coping: Goal: Coping ability will improve Outcome: Adequate for Discharge   Problem: Self-Concept: Goal: Will verbalize positive feelings about self Outcome: Adequate for Discharge

## 2022-02-26 NOTE — Progress Notes (Signed)
Discharge note: SSP and survey complete. RN met with pt and reviewed pt's discharge instructions. Pt verbalized understanding of discharge instructions and pt did not have any questions. RN reviewed and provided pt with a copy of SRA, AVS and Transition Record. RN returned pt's belongings to pt. Prescriptions were given to pt. Pt denied SI/HI/AVH and voiced no concerns. Pt was appreciative of the care pt received at [facility]. Patient discharged to their ride without incident.  02/26/22 0811  Psych Admission Type (Psych Patients Only)  Admission Status Voluntary  Psychosocial Assessment  Patient Complaints None  Eye Contact Fair  Facial Expression Other (Comment) (WDL)  Affect Appropriate to circumstance  Speech Logical/coherent  Interaction Assertive  Motor Activity Slow;Unsteady  Appearance/Hygiene Unremarkable  Behavior Characteristics Cooperative;Appropriate to situation;Calm  Mood Pleasant  Aggressive Behavior  Effect No apparent injury  Thought Process  Coherency WDL  Content WDL  Delusions None reported or observed  Perception WDL  Hallucination None reported or observed  Judgment WDL  Confusion None  Danger to Self  Current suicidal ideation? Denies

## 2022-02-26 NOTE — Progress Notes (Signed)
Recreation Therapy Notes  Date: 02/26/2022   Time: 10:20 am     Location: Craft room      Behavioral response: N/A   Intervention Topic: Wellness    Discussion/Intervention: Patient refused to attend group.    Clinical Observations/Feedback:  Patient refused to attend group.    Naasia Weilbacher LRT/CTRS          Wei Poplaski 02/26/2022 11:10 AM

## 2022-02-26 NOTE — Plan of Care (Signed)
  Problem: Education: Goal: Knowledge of General Education information will improve Description: Including pain rating scale, medication(s)/side effects and non-pharmacologic comfort measures 02/26/2022 0556 by Butler-Nicholson, Jaishawn Witzke L, LPN Outcome: Progressing 02/26/2022 0556 by Butler-Nicholson, Mathieu Schloemer L, LPN Outcome: Progressing   Problem: Health Behavior/Discharge Planning: Goal: Ability to manage health-related needs will improve 02/26/2022 0556 by Butler-Nicholson, Raeanne Deschler L, LPN Outcome: Progressing 02/26/2022 0556 by Butler-Nicholson, Britt Theard L, LPN Outcome: Progressing   Problem: Clinical Measurements: Goal: Ability to maintain clinical measurements within normal limits will improve 02/26/2022 0556 by Butler-Nicholson, Kayley Zeiders L, LPN Outcome: Progressing 02/26/2022 0556 by Butler-Nicholson, Sharesa Kemp L, LPN Outcome: Progressing Goal: Will remain free from infection 02/26/2022 0556 by Butler-Nicholson, Aylinn Rydberg L, LPN Outcome: Progressing 02/26/2022 0556 by Butler-Nicholson, Jarion Hawthorne L, LPN Outcome: Progressing Goal: Diagnostic test results will improve 02/26/2022 0556 by Butler-Nicholson, Fabiha Rougeau L, LPN Outcome: Progressing 02/26/2022 0556 by Butler-Nicholson, Valerie Cones L, LPN Outcome: Progressing Goal: Respiratory complications will improve 02/26/2022 0556 by Butler-Nicholson, Landy Mace L, LPN Outcome: Progressing 02/26/2022 0556 by Butler-Nicholson, Keniesha Adderly L, LPN Outcome: Progressing Goal: Cardiovascular complication will be avoided 02/26/2022 0556 by Butler-Nicholson, Makayia Duplessis L, LPN Outcome: Progressing 02/26/2022 0556 by Butler-Nicholson, Saloni Lablanc L, LPN Outcome: Progressing   Problem: Self-Concept: Goal: Will verbalize positive feelings about self 02/26/2022 0556 by Butler-Nicholson, Gregory Barrick L, LPN Outcome: Progressing 02/26/2022 0556 by Butler-Nicholson, Edrian Melucci L, LPN Outcome: Progressing Goal: Level of anxiety will  decrease 02/26/2022 0556 by Butler-Nicholson, Ricketta Colantonio L, LPN Outcome: Progressing 02/26/2022 0556 by Butler-Nicholson, Kadon Andrus L, LPN Outcome: Progressing   Problem: Safety: Goal: Ability to disclose and discuss suicidal ideas will improve 02/26/2022 0556 by Butler-Nicholson, Clennon Nasca L, LPN Outcome: Progressing 02/26/2022 0556 by Butler-Nicholson, Maisey Deandrade L, LPN Outcome: Progressing Goal: Ability to identify and utilize support systems that promote safety will improve 02/26/2022 0556 by Butler-Nicholson, Keoshia Steinmetz L, LPN Outcome: Progressing 02/26/2022 0556 by Butler-Nicholson, Jackie Littlejohn L, LPN Outcome: Progressing   Problem: Role Relationship: Goal: Will demonstrate positive changes in social behaviors and relationships 02/26/2022 0556 by Butler-Nicholson, Ellia Knowlton L, LPN Outcome: Progressing 02/26/2022 0556 by Butler-Nicholson, Pryce Folts L, LPN Outcome: Progressing   Problem: Health Behavior/Discharge Planning: Goal: Ability to make decisions will improve 02/26/2022 0556 by Butler-Nicholson, Twala Collings L, LPN Outcome: Progressing 02/26/2022 0556 by Butler-Nicholson, Jakyah Bradby L, LPN Outcome: Progressing Goal: Compliance with therapeutic regimen will improve 02/26/2022 0556 by Butler-Nicholson, Jc Veron L, LPN Outcome: Progressing 02/26/2022 0556 by Butler-Nicholson, Ibrahem Volkman L, LPN Outcome: Progressing   Problem: Coping: Goal: Coping ability will improve 02/26/2022 0556 by Butler-Nicholson, Moselle Rister L, LPN Outcome: Progressing 02/26/2022 0556 by Butler-Nicholson, Courtenay Hirth L, LPN Outcome: Progressing Goal: Will verbalize feelings 02/26/2022 0556 by Duwaine Maxin, LPN Outcome: Progressing 02/26/2022 0556 by Butler-Nicholson, Rica Koyanagi, LPN Outcome: Progressing

## 2022-02-26 NOTE — Progress Notes (Signed)
  [facility] Adult Case Management Discharge Plan :  Will you be returning to the same living situation after discharge:  Yes,  pt plans to return home upon discharge. At discharge, do you have transportation home?: Yes,  CSW to arrange transportation home. Do you have the ability to pay for your medications: Yes,  Blue [employer] Blue Shield/[employer] PPO.  Release of information consent forms completed and in the chart;  Patient's signature needed at discharge.  Patient to Follow up at:  [place] Follow up.   Specialty: Behavioral Health Why: You have a virtual appointment scheduled for 03/17/22 at 2pm with Zigmund Gottron, NP for continued medication management. You have a virtual therapy appointment scheduled for 03/20/22 at 2pm. Thanks! Contact information: [place] [phone]                Next level of care provider has access to [place] and Suicide Prevention discussed: Yes,  SPE completed with pt.     Has patient been referred to the Quitline?: N/A patient is not a smoker  Patient has been referred for addiction treatment: N/A  Shirl Harris, LCSW 02/26/2022, 1:39 PM

## 2022-02-26 NOTE — Progress Notes (Signed)
Patient is pleasant and cooperative.  She is easy to engage in conversation. She is med compliant.  Has no issues or concerns that  need to be addressed. Reported eating and sleeping well through the night. Happy to be getting back on medication.      C Butler-Nicholson, LPN

## 2022-02-26 NOTE — BHH Suicide Risk Assessment (Signed)
[facility] Discharge Suicide Risk Assessment   Principal Problem: Severe recurrent major depression without psychotic features ([place]) Discharge Diagnoses: Principal Problem:   Severe recurrent major depression without psychotic features ([place]) Active Problems:   Hypothyroidism   Type II diabetes mellitus with complication ([place])   Anxiety   Obsessive-compulsive disorder   Essential hypertension, benign   Allergic rhinitis   Osteoarthritis   Total Time spent with patient: 30 minutes  Musculoskeletal: Strength & Muscle Tone: within normal limits Gait & Station: normal Patient leans: N/A  Psychiatric Specialty Exam  Presentation  General Appearance:  Appropriate for Environment  Eye Contact: Good  Speech: Clear and Coherent  Speech Volume: Decreased  Handedness: Right   Mood and Affect  Mood: Depressed; Anxious  Duration of Depression Symptoms: No data recorded Affect: Congruent   Thought Process  Thought Processes: Coherent  Descriptions of Associations:Intact  Orientation:Full (Time, Place and Person)  Thought Content:Logical  History of Schizophrenia/Schizoaffective disorder:No data recorded Duration of Psychotic Symptoms:No data recorded Hallucinations:No data recorded Ideas of Reference:None  Suicidal Thoughts:No data recorded Homicidal Thoughts:No data recorded  Sensorium  Memory: Immediate Fair; Recent Fair; Remote Fair  Judgment: Fair  Insight: Lacking   Executive Functions  Concentration: Good  Attention Span: Good  Recall: [place] of Knowledge: Fair  Language: Fair   Psychomotor Activity  Psychomotor Activity:No data recorded  Assets  Assets: Desire for Improvement; Social Support   Sleep  Sleep:No data recorded  Physical Exam: Physical Exam Vitals and nursing note reviewed.  Constitutional:      Appearance: Normal appearance.  HENT:     Head: Normocephalic and atraumatic.     Mouth/Throat:      Pharynx: Oropharynx is clear.  Eyes:     Pupils: Pupils are equal, round, and reactive to light.  Cardiovascular:     Rate and Rhythm: Normal rate and regular rhythm.  Pulmonary:     Effort: Pulmonary effort is normal.     Breath sounds: Normal breath sounds.  Abdominal:     General: Abdomen is flat.     Palpations: Abdomen is soft.  Musculoskeletal:        General: Normal range of motion.  Skin:    General: Skin is warm and dry.  Neurological:     General: No focal deficit present.     Mental Status: She is alert. Mental status is at baseline.  Psychiatric:        Attention and Perception: Attention normal.        Mood and Affect: Mood normal.        Speech: Speech normal.        Behavior: Behavior normal.        Thought Content: Thought content normal.        Cognition and Memory: Cognition normal.        Judgment: Judgment normal.    Review of Systems  Constitutional: Negative.   HENT: Negative.    Eyes: Negative.   Respiratory: Negative.    Cardiovascular: Negative.   Gastrointestinal: Negative.   Musculoskeletal: Negative.   Skin: Negative.   Neurological: Negative.   Psychiatric/Behavioral: Negative.     Blood pressure (!) 159/95, pulse 93, temperature 98.8 F (37.1 C), temperature source Oral, resp. rate 20, height 5\' 11"  (1.803 m), weight (!) 176 kg, SpO2 99 %. Body mass index is 54.12 kg/m.  Mental Status Per Nursing Assessment::   On Admission:  Suicidal ideation indicated by others  Demographic Factors:  NA  Loss Factors:  Decline in physical health  Historical Factors: Anniversary of important loss  Risk Reduction Factors:   Sense of responsibility to family, Living with another person, especially a relative, Positive social support, Positive therapeutic relationship, and Positive coping skills or problem solving skills  Continued Clinical Symptoms:  Severe Anxiety and/or Agitation Depression:   Impulsivity  Cognitive Features That Contribute  To Risk:  None    Suicide Risk:  Minimal: No identifiable suicidal ideation.  Patients presenting with no risk factors but with morbid ruminations; may be classified as minimal risk based on the severity of the depressive symptoms    Plan Of Care/Follow-up recommendations:  Other:  Patient has been pleasant and cooperative throughout her time here.  Not displayed any dangerous behavior.  Reports no suicidal thoughts and is very agreeable to recommended outpatient treatment.  No longer meets commitment criteria and will be discharged to her follow-up with her primary provider with recommendations for medication management and therapy.  Samantha Berthold, MD 02/26/2022, 9:48 AM

## 2022-02-26 NOTE — BH IP Treatment Plan (Signed)
Interdisciplinary Treatment and Diagnostic Plan Update  02/26/2022 Time of Session: [place] MRN: [id number]  Principal Diagnosis: Severe recurrent major depression without psychotic features [facility])  Secondary Diagnoses: Principal Problem:   Severe recurrent major depression without psychotic features ([place]) Active Problems:   Hypothyroidism   Type II diabetes mellitus with complication ([place])   Anxiety   Obsessive-compulsive disorder   Essential hypertension, benign   Allergic rhinitis   Osteoarthritis   Current Medications:  Current Facility-Administered Medications  Medication Dose Route Frequency Provider Last Rate Last Admin   acetaminophen (TYLENOL) tablet 650 mg  650 mg Oral Q6H PRN Clapacs, [name], MD   650 mg at 02/26/22 0811   albuterol (VENTOLIN HFA) 108 (90 Base) MCG/ACT inhaler 2 puff  2 puff Inhalation Q6H PRN Clapacs, John T, MD       alum & mag hydroxide-simeth (MAALOX/MYLANTA) 200-200-20 MG/5ML suspension 30 mL  30 mL Oral Q4H PRN Clapacs, John T, MD       dicyclomine (BENTYL) tablet 20 mg  20 mg Oral TID PRN Clapacs, [name], MD       famotidine (PEPCID) tablet 20 mg  20 mg Oral BID Clapacs, John T, MD   20 mg at 02/26/22 0809   FLUoxetine (PROZAC) capsule 80 mg  80 mg Oral Daily Clapacs, John T, MD   80 mg at 02/26/22 0808   fluticasone furoate-vilanterol (BREO ELLIPTA) 100-25 MCG/ACT 1 puff  1 puff Inhalation Daily Clapacs, John T, MD   1 puff at 02/26/22 0815   furosemide (LASIX) tablet 40 mg  40 mg Oral Daily Clapacs, John T, MD   40 mg at 02/26/22 0809   hydrOXYzine (ATARAX) tablet 50 mg  50 mg Oral TID PRN Clapacs, [name], MD   50 mg at 02/24/22 2110   insulin aspart (novoLOG) injection 15 Units  15 Units Subcutaneous QAC lunch Clapacs, [name], MD   15 Units at 02/26/22 1113   insulin aspart (novoLOG) injection 20 Units  20 Units Subcutaneous QAC supper Clapacs, [name], MD   20 Units at 02/25/22 1756   insulin glargine-yfgn (SEMGLEE) injection 65  Units  65 Units Subcutaneous BID Clapacs, [name], MD   65 Units at 02/26/22 0808   isosorbide-hydrALAZINE (BIDIL) 20-37.5 MG per tablet 1 tablet  1 tablet Oral TID Clapacs, [name], MD   1 tablet at 02/26/22 1113   levothyroxine (SYNTHROID) tablet 300 mcg  300 mcg Oral Q0600 Clapacs, John T, MD   300 mcg at 02/26/22 9233   magnesium hydroxide (MILK OF MAGNESIA) suspension 30 mL  30 mL Oral Daily PRN Clapacs, John T, MD       metFORMIN (GLUCOPHAGE) tablet 500 mg  500 mg Oral BID WC Clapacs, John T, MD   500 mg at 02/26/22 0076   metoprolol tartrate (LOPRESSOR) tablet 25 mg  25 mg Oral BID Clapacs, John T, MD   25 mg at 02/26/22 0809   mirtazapine (REMERON) tablet 30 mg  30 mg Oral QHS Clapacs, John T, MD   30 mg at 02/25/22 2119   rosuvastatin (CRESTOR) tablet 20 mg  20 mg Oral Daily Clapacs, John T, MD   20 mg at 02/26/22 0809   traZODone (DESYREL) tablet 100 mg  100 mg Oral QHS Caroline Sauger, NP   100 mg at 02/25/22 2119   PTA Medications: Medications Prior to Admission  Medication Sig Dispense Refill Last Dose   FLUoxetine (PROZAC) 40 MG capsule Take 1 capsule (40  mg total) by mouth daily. 30 capsule 0 02/24/2022   [DISCONTINUED] Insulin Glargine (BASAGLAR KWIKPEN) 100 UNIT/ML Inject 10 Units into the skin daily. Take 65U twice a day 15 mL 6 02/24/2022   [DISCONTINUED] Vitamin D, Ergocalciferol, (DRISDOL) 50000 units CAPS capsule Take 1 capsule (50,000 Units total) by mouth every 7 (seven) days. Sunday 12 capsule 0 Past Week   albuterol (PROVENTIL) (2.5 MG/3ML) 0.083% nebulizer solution Take 3 mLs by nebulization 3 (three) times daily as needed. 360 mL 0 prn   albuterol (VENTOLIN HFA) 108 (90 Base) MCG/ACT inhaler Inhale 2 puffs into the lungs every 4 (four) hours as needed for wheezing or shortness of breath. 18 g 0 prn   blood glucose meter kit and supplies KIT Dispense based on patient and insurance preference. Use up to four times daily as directed. 1 each 0    fluticasone (FLONASE) 50  MCG/ACT nasal spray Place 1 spray into both nostrils daily. 1 spray in each nostril every day 16 g 1 prn   fluticasone furoate-vilanterol (BREO ELLIPTA) 200-25 MCG/ACT AEPB Inhale 1 puff into the lungs daily. 1 each 0 02/18/2022   levothyroxine (SYNTHROID) 300 MCG tablet Take 1 tablet (300 mcg total) by mouth daily before breakfast. 90 tablet 0 02/19/2022   metFORMIN (GLUCOPHAGE-XR) 750 MG 24 hr tablet Take 1 tablet (750 mg total) by mouth 2 (two) times daily with a meal.   02/19/2022   mirtazapine (REMERON) 30 MG tablet Take 1 tablet (30 mg total) by mouth daily. 30 tablet 3 02/19/2022   [DISCONTINUED] dicyclomine (BENTYL) 20 MG tablet Take 1 tablet (20 mg total) by mouth 3 (three) times daily as needed for spasms (abdominal pain).   02/19/2022   [DISCONTINUED] famotidine (PEPCID) 20 MG tablet Take 20 mg by mouth 2 (two) times daily.   02/19/2022   [DISCONTINUED] furosemide (LASIX) 40 MG tablet Take 1 tablet (40 mg total) by mouth daily. 30 tablet 0 02/19/2022   [DISCONTINUED] insulin aspart (NOVOLOG) 100 UNIT/ML FlexPen Inject 10 units with Lunch and 15 units with Dinner (Patient taking differently: 15-20 Units in the morning and at bedtime. Inject 15 units with Lunch and 20 units with Dinner) 15 mL 11    [DISCONTINUED] isosorbide-hydrALAZINE (BIDIL) 20-37.5 MG tablet Take 1 tablet by mouth 3 (three) times daily. 90 tablet 3 02/19/2022   [DISCONTINUED] metoprolol tartrate (LOPRESSOR) 25 MG tablet Take 1 tablet (25 mg total) by mouth 2 (two) times daily. 60 tablet 3 02/19/2022   [DISCONTINUED] rosuvastatin (CRESTOR) 20 MG tablet Take 1 tablet (20 mg total) by mouth daily. 90 tablet 3 02/19/2022   [DISCONTINUED] Semaglutide, 1 MG/DOSE, 4 MG/3ML SOPN Inject 1 mg into the skin once a week. (Patient taking differently: Inject 1 mg into the skin once a week. Sunday night) 3 mL 1 02/16/2022    Patient Stressors: Loss of a son seven years ago    Patient Strengths: General fund of knowledge  Motivation  for treatment/growth  Supportive family/friends   Treatment Modalities: Medication Management, Group therapy, Case management,  1 to 1 session with clinician, Psychoeducation, Recreational therapy.   Physician Treatment Plan for Primary Diagnosis: Severe recurrent major depression without psychotic features ([place]) Long Term Goal(s): Improvement in symptoms so as ready for discharge   Short Term Goals: Ability to maintain clinical measurements within normal limits will improve Compliance with prescribed medications will improve Ability to verbalize feelings will improve Ability to disclose and discuss suicidal ideas Ability to demonstrate self-control will improve  Medication Management: Evaluate  patient's response, side effects, and tolerance of medication regimen.  Therapeutic Interventions: 1 to 1 sessions, Unit Group sessions and Medication administration.  Evaluation of Outcomes: Adequate for Discharge  Physician Treatment Plan for Secondary Diagnosis: Principal Problem:   Severe recurrent major depression without psychotic features ([place]) Active Problems:   Hypothyroidism   Type II diabetes mellitus with complication ([place])   Anxiety   Obsessive-compulsive disorder   Essential hypertension, benign   Allergic rhinitis   Osteoarthritis  Long Term Goal(s): Improvement in symptoms so as ready for discharge   Short Term Goals: Ability to maintain clinical measurements within normal limits will improve Compliance with prescribed medications will improve Ability to verbalize feelings will improve Ability to disclose and discuss suicidal ideas Ability to demonstrate self-control will improve     Medication Management: Evaluate patient's response, side effects, and tolerance of medication regimen.  Therapeutic Interventions: 1 to 1 sessions, Unit Group sessions and Medication administration.  Evaluation of Outcomes: Adequate for Discharge   RN Treatment Plan for Primary  Diagnosis: Severe recurrent major depression without psychotic features ([place]) Long Term Goal(s): Knowledge of disease and therapeutic regimen to maintain health will improve  Short Term Goals: Ability to remain free from injury will improve, Ability to verbalize frustration and anger appropriately will improve, Ability to demonstrate self-control, Ability to participate in decision making will improve, Ability to verbalize feelings will improve, Ability to disclose and discuss suicidal ideas, Ability to identify and develop effective coping behaviors will improve, and Compliance with prescribed medications will improve  Medication Management: RN will administer medications as ordered by provider, will assess and evaluate patient's response and provide education to patient for prescribed medication. RN will report any adverse and/or side effects to prescribing provider.  Therapeutic Interventions: 1 on 1 counseling sessions, Psychoeducation, Medication administration, Evaluate responses to treatment, Monitor vital signs and CBGs as ordered, Perform/monitor CIWA, COWS, AIMS and Fall Risk screenings as ordered, Perform wound care treatments as ordered.  Evaluation of Outcomes: Adequate for Discharge   LCSW Treatment Plan for Primary Diagnosis: Severe recurrent major depression without psychotic features ([place]) Long Term Goal(s): Safe transition to appropriate next level of care at discharge, Engage patient in therapeutic group addressing interpersonal concerns.  Short Term Goals: Engage patient in aftercare planning with referrals and resources, Increase social support, Increase ability to appropriately verbalize feelings, Increase emotional regulation, Facilitate acceptance of mental health diagnosis and concerns, Facilitate patient progression through stages of change regarding substance use diagnoses and concerns, Identify triggers associated with mental health/substance abuse issues, and Increase  skills for wellness and recovery  Therapeutic Interventions: Assess for all discharge needs, 1 to 1 time with Social worker, Explore available resources and support systems, Assess for adequacy in community support network, Educate family and significant other(s) on suicide prevention, Complete Psychosocial Assessment, Interpersonal group therapy.  Evaluation of Outcomes: Adequate for Discharge   Progress in Treatment: Attending groups: Yes. Participating in groups: Yes. Taking medication as prescribed: Yes. Toleration medication: Yes. Family/Significant other contact made: No, will contact:  Patient has declined to provide consent for CSW to reach family/friend.  Patient understands diagnosis: Yes. Discussing patient identified problems/goals with staff: Yes. Medical problems stabilized or resolved: Yes. Denies suicidal/homicidal ideation: Yes. Issues/concerns per patient self-inventory: Yes. Other: none  New problem(s) identified: No, Describe:  none  New Short Term/Long Term Goal(s): Patient to continue working towards treatment goals after discharge. Patient no longer meets criteria for inpatient criteria per attending physician. Continue taking medications as prescribed, nursing  to provide instructions at discharge. Follow up with all scheduled appointments.   Patient Goals:  Patient states their goal for treatment is to "get me some tools to help with my mindset . . . And reach out for help."  Discharge Plan or Barriers: No psychosocial barriers identified at this time, patient to return to place of residence when appropriate for discharge.   Reason for Continuation of Hospitalization: N/A, patient scheduled to discharge on this day.   Last 3 [place] Suicide Severity Risk Score: Flowsheet Row Admission (Current) from 02/24/2022 in [place] ED to Hosp-Admission (Discharged) from 02/20/2022 in [facility] to  Hosp-Admission (Discharged) from 05/19/2021 in [facility] 4E CV SURGICAL PROGRESSIVE CARE  C-SSRS RISK CATEGORY High Risk High Risk No Risk       Last PHQ 2/9 Scores:    01/13/2022    2:21 PM 12/24/2021    3:28 PM 11/12/2021    3:39 PM  Depression screen PHQ 2/9  Decreased Interest _0 Down, Depressed, Hopeless _1 PHQ - 2 Score _2 Altered sleeping _3 Tired, decreased energy _4 Change in appetite 1 1 0  Feeling bad or failure about yourself  1 0 0  Trouble concentrating 1 0 0  Moving slowly or fidgety/restless 0 0 0  Suicidal thoughts 0 0 0  PHQ-9 Score _5 Difficult doing work/chores   Somewhat difficult    Scribe for Treatment Team: Larose Kells 02/26/2022 12:42 PM

## 2022-03-11 NOTE — Progress Notes (Signed)
    SUBJECTIVE:   CHIEF COMPLAINT / HPI:   [name] is a 52 y.o. female who presents to the [facility] clinic today to discuss the following concerns:   Hospital Follow Up   PERTINENT  PMH / [place]: Bipolar disorder, SI attempt via overdose, type 2 diabetes, hypertension, asthma  OBJECTIVE:   There were no vitals taken for this visit. ***   General: NAD, pleasant, able to participate in exam Cardiac: RRR, no murmurs. Respiratory: CTAB, normal effort, No wheezes, rales or rhonchi Abdomen: Bowel sounds present, nontender, nondistended, no hepatosplenomegaly. Extremities: no edema or cyanosis. Skin: warm and dry, no rashes noted Neuro: alert, no obvious focal deficits Psych: Normal affect and mood  ASSESSMENT/PLAN:   No problem-specific Assessment & Plan notes found for this encounter.     [name], [place]

## 2022-03-11 NOTE — Patient Instructions (Addendum)
It was wonderful to see you today.  Please bring ALL of your medications with you to every visit.   Today we talked about:  We are doing lab work today to check your kidneys, electrolytes and blood count. I will send you a MyChart message if you have MyChart. Otherwise, I will give you a call for abnormal results or send a letter if everything returned back normal. If you don't hear from me in 2 weeks, please call the office.    I will fill out your FMLA paperwork and have it completed by the end of the week.   Please keep your appointment with your Psychiatrist and psychotherapist.   If you are feeling suicidal or depression symptoms worsen please immediately go to:   If you are thinking about harming yourself or having thoughts of suicide, or if you know someone who is, seek help right away. If you are in crisis, make sure you are not left alone.  If someone else is in crisis, make sure he/she/they is not left alone  Call 988 OR 1-800-273-TALK  24 Hour Availability for [place]  [address] [place], [place] [facility] [phone]    Other crisis resources:  [facility] [employer] (Domestic Violence, Rape & Victim Assistance [phone]  [facility]    (ONLY from 8am-4pm)    [phone]  [facility] (24/7)   [phone]  [place] National Suicide Hotline   6145578079 Diamantina Monks)   Thank you for coming to your visit as scheduled. We have had a large "no-show" problem lately, and this significantly limits our ability to see and care for patients. As a friendly reminder- if you cannot make your appointment please call to cancel. We do have a no show policy for those who do not cancel within 24 hours. Our policy is that if you miss or fail to cancel an appointment within 24 hours, 3 times in a 26-month period, you may be dismissed from our  clinic.   Thank you for choosing [facility].   Please call [phone] with any questions about today's appointment.  Please be sure to schedule follow up at the front  desk before you leave today.   [name], DO PGY-3 Family Medicine

## 2022-03-12 ENCOUNTER — Encounter: Payer: Self-pay | Admitting: Family Medicine

## 2022-03-12 ENCOUNTER — Ambulatory Visit (INDEPENDENT_AMBULATORY_CARE_PROVIDER_SITE_OTHER): Payer: BC Managed Care – PPO | Admitting: Family Medicine

## 2022-03-12 VITALS — BP 143/80 | HR 101 | Ht 72.0 in | Wt 377.0 lb

## 2022-03-12 DIAGNOSIS — D649 Anemia, unspecified: Secondary | ICD-10-CM | POA: Diagnosis not present

## 2022-03-12 DIAGNOSIS — F332 Major depressive disorder, recurrent severe without psychotic features: Secondary | ICD-10-CM

## 2022-03-12 DIAGNOSIS — N1831 Chronic kidney disease, stage 3a: Secondary | ICD-10-CM | POA: Diagnosis not present

## 2022-03-12 DIAGNOSIS — R109 Unspecified abdominal pain: Secondary | ICD-10-CM | POA: Insufficient documentation

## 2022-03-12 MED ORDER — DICYCLOMINE HCL 20 MG PO TABS: 20.0000 mg | ORAL_TABLET | Freq: Three times a day (TID) | ORAL | 0 refills | Status: DC | PRN

## 2022-03-12 MED ORDER — LORAZEPAM 1 MG PO TABS: 1.0000 mg | ORAL_TABLET | Freq: Two times a day (BID) | ORAL | 0 refills | Status: DC | PRN

## 2022-03-12 NOTE — Assessment & Plan Note (Addendum)
With recent SI attempt last month. Has close f/u with Psychiatry and Psychotherapy. She denies any SI today, PHQ-9 score of 7. She appears more stable, endorses taking medications as prescribed and does not have any symptoms of hypoglycemia. She has been prescribed 1 mg of Ativan monthly since 04/2020. Discussed the potential harmful effects this could have if not taken as prescribed- she states she feels comfortable with this medication and will continue to take as prescribed. She does have significant anxiety along with her MDD and seems to have benefit from her Ativan so will continue this prescription. PDMP reviewed and refilled 1 month supply.  -Continue mirtazapine, Prozac as prescribed by psychiatry -Refilled Ativan 1 mg as needed BID for anxiety, quantity 45 tablets for 1 month supply  -Encouraged to keep f/u with Psychiatry and Psychotherapist -Will fill out FMLA paperwork for patient  -Provided with suicide hotline resources and crisis centers -Check CBC, BMP today to f/u anemia and renal function from recent hospitalization

## 2022-03-12 NOTE — Assessment & Plan Note (Signed)
Received good relief with Bentyl during her hospitalization.  She request refill. -Rx for Bentyl provided

## 2022-03-13 ENCOUNTER — Telehealth: Payer: Self-pay

## 2022-03-13 ENCOUNTER — Other Ambulatory Visit: Payer: Self-pay | Admitting: Family Medicine

## 2022-03-13 DIAGNOSIS — N179 Acute kidney failure, unspecified: Secondary | ICD-10-CM

## 2022-03-13 LAB — CBC
Hematocrit: 33.4 % — ABNORMAL LOW (ref 34.0–46.6)
Hemoglobin: 10.8 g/dL — ABNORMAL LOW (ref 11.1–15.9)
MCH: 27.5 pg (ref 26.6–33.0)
MCHC: 32.3 g/dL (ref 31.5–35.7)
MCV: 85 fL (ref 79–97)
Platelets: 422 10*3/uL (ref 150–450)
RBC: 3.93 x10E6/uL (ref 3.77–5.28)
RDW: 13.1 % (ref 11.7–15.4)
WBC: 5.6 10*3/uL (ref 3.4–10.8)

## 2022-03-13 LAB — BASIC METABOLIC PANEL
BUN/Creatinine Ratio: 11 (ref 9–23)
BUN: 26 mg/dL — ABNORMAL HIGH (ref 6–24)
CO2: 20 mmol/L (ref 20–29)
Calcium: 9 mg/dL (ref 8.7–10.2)
Chloride: 103 mmol/L (ref 96–106)
Creatinine, Ser: 2.35 mg/dL — ABNORMAL HIGH (ref 0.57–1.00)
Glucose: 175 mg/dL — ABNORMAL HIGH (ref 70–99)
Potassium: 4.7 mmol/L (ref 3.5–5.2)
Sodium: 140 mmol/L (ref 134–144)
eGFR: 24 mL/min/{1.73_m2} — ABNORMAL LOW (ref >59)

## 2022-03-13 NOTE — Telephone Encounter (Signed)
FMLA forms placed up front for pick up.   Copy made for batch scanning.   Patient aware.

## 2022-03-13 NOTE — Telephone Encounter (Signed)
-----   Message from [name], DO sent at 03/12/2022  6:45 PM EST ----- Regarding: FMLA paperwork completed Hi,  I saw this patient in clinic today and she handed me her FMLA paperwork. I have completed it and placed in RN triage box.  Thank you! Dawson Bills

## 2022-03-17 ENCOUNTER — Encounter (HOSPITAL_COMMUNITY): Payer: Self-pay

## 2022-03-17 ENCOUNTER — Telehealth (HOSPITAL_COMMUNITY): Payer: BC Managed Care – PPO | Admitting: Psychiatry

## 2022-03-20 ENCOUNTER — Ambulatory Visit (INDEPENDENT_AMBULATORY_CARE_PROVIDER_SITE_OTHER): Payer: BC Managed Care – PPO | Admitting: Clinical

## 2022-03-20 ENCOUNTER — Telehealth: Payer: Self-pay | Admitting: Family Medicine

## 2022-03-20 ENCOUNTER — Other Ambulatory Visit: Payer: Self-pay | Admitting: Family Medicine

## 2022-03-20 DIAGNOSIS — F332 Major depressive disorder, recurrent severe without psychotic features: Secondary | ICD-10-CM | POA: Diagnosis not present

## 2022-03-20 DIAGNOSIS — E118 Type 2 diabetes mellitus with unspecified complications: Secondary | ICD-10-CM

## 2022-03-20 NOTE — Telephone Encounter (Signed)
Patient dropped off forms for her insurance company [employer] that needs to be completed by her doctor.  Put forms in red folder for provider this afternoon. Any questions call patient.
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
## 2022-03-20 NOTE — Telephone Encounter (Signed)
Reviewed form and placed in PCP's box for completion.  .Decari Duggar R Stana Bayon, CMA  

## 2022-03-21 ENCOUNTER — Other Ambulatory Visit: Payer: BC Managed Care – PPO

## 2022-03-21 DIAGNOSIS — N179 Acute kidney failure, unspecified: Secondary | ICD-10-CM

## 2022-03-22 LAB — BASIC METABOLIC PANEL
BUN/Creatinine Ratio: 12 (ref 9–23)
BUN: 24 mg/dL (ref 6–24)
CO2: 21 mmol/L (ref 20–29)
Calcium: 8.7 mg/dL (ref 8.7–10.2)
Chloride: 101 mmol/L (ref 96–106)
Creatinine, Ser: 2.04 mg/dL — ABNORMAL HIGH (ref 0.57–1.00)
Glucose: 220 mg/dL — ABNORMAL HIGH (ref 70–99)
Potassium: 4.3 mmol/L (ref 3.5–5.2)
Sodium: 138 mmol/L (ref 134–144)
eGFR: 29 mL/min/{1.73_m2} — ABNORMAL LOW (ref >59)

## 2022-03-26 ENCOUNTER — Telehealth (INDEPENDENT_AMBULATORY_CARE_PROVIDER_SITE_OTHER): Payer: BC Managed Care – PPO | Admitting: Psychiatry

## 2022-03-26 ENCOUNTER — Encounter (HOSPITAL_COMMUNITY): Payer: Self-pay | Admitting: Psychiatry

## 2022-03-26 DIAGNOSIS — F4321 Adjustment disorder with depressed mood: Secondary | ICD-10-CM | POA: Diagnosis not present

## 2022-03-26 DIAGNOSIS — F32A Depression, unspecified: Secondary | ICD-10-CM

## 2022-03-26 DIAGNOSIS — F411 Generalized anxiety disorder: Secondary | ICD-10-CM | POA: Diagnosis not present

## 2022-03-26 DIAGNOSIS — Z634 Disappearance and death of family member: Secondary | ICD-10-CM | POA: Diagnosis not present

## 2022-03-26 MED ORDER — FLUOXETINE HCL 40 MG PO CAPS: 80.0000 mg | ORAL_CAPSULE | Freq: Every day | ORAL | 3 refills | Status: DC

## 2022-03-26 MED ORDER — MIRTAZAPINE 45 MG PO TABS: 45.0000 mg | ORAL_TABLET | Freq: Every day | ORAL | 3 refills | Status: DC

## 2022-03-26 MED ORDER — HYDROXYZINE HCL 50 MG PO TABS: 50.0000 mg | ORAL_TABLET | Freq: Three times a day (TID) | ORAL | 3 refills | Status: DC | PRN

## 2022-03-26 NOTE — Progress Notes (Signed)
BH MD/PA/NP OP Progress Note Virtual Visit via Video Note  I connected with [name] on 03/26/22 at  8:00 AM EST by a video enabled telemedicine application and verified that I am speaking with the correct person using two identifiers.  Location: Patient: Home Provider: Clinic   I discussed the limitations of evaluation and management by telemedicine and the availability of in person appointments. The patient expressed understanding and agreed to proceed.  I provided 30 minutes of non-face-to-face time during this encounter.       03/26/2022 9:41 AM Leanne Lovely  MRN:  [id number]  Chief Complaint: "I am up and down"  HPI: 52 year old female seen today for follow up psychiatric evaluation.  She was recently hospitalized on 02/24/2022 through 02/26/2022 for increasing depression and suicidal attempt.  Per chart review patient overdosed on insulin.  Her medications were adjusted and she is currently being managed on trazodone 100 mg, Prozac 80 mg daily, Vistaril 50 mg three times daily, and mirtazapine 30 mg at bedtime.  She informed Probation officer that trazodone was discontinued by her primary care doctor.  She notes that her medications are somewhat effective in managing her psychiatric conditions.  Today she was well-groomed, pleasant, cooperative, and engaged in conversation.  Patient tearful throughout the exam.  She notes that she has been up-and-down.  She notes that she attempts to be happy but cannot be.  She reports that she fights for her mental health but fears that she will never get better.  Patient informed Probation officer that the holiday was hard.  She continues to grieve for her son.  She does note that work has been supportive and notes that she is doing well there.  Today provider conducted a GAD-7 and patient scored a 14, at her last visit she scored a 5.  Provider also conducted PHQ-9 patient scored a 12, at her last visit she scored a 4.  She endorses poor appetite and notes that  she is lost 20 pounds in the last 3 months.  Provider recommended patient carrying small snacks to work so that she can eat throughout the day such as carrots, eggs, nuts, and protein shakes.  She endorsed understanding and notes that she will try.  Patient informed writer that her sleep fluctuates noting that she sleeps approximately 5 hours nightly.  Today she endorses passive SI but denies wanting to harm herself.  She denies SI/HI/AVH, mania, paranoia.   Patient reports that she continues to be skeptical about therapy but is trying to do things differently.  She notes that she is waiting on a call to start her therapy. Today mirtazapine 30 mg increased to 45 mg to help manage sleep, anxiety, depression, and appetite.  Patient agreeable to continue all other medication as prescribed.  No other concerns noted at this time.  Visit Diagnosis:    ICD-10-CM   1. Mild depression  F32.A FLUoxetine (PROZAC) 40 MG capsule    mirtazapine (REMERON) 45 MG tablet    2. Grief at loss of child  F43.21    Z63.4     3. Generalized anxiety disorder  F41.1 FLUoxetine (PROZAC) 40 MG capsule    hydrOXYzine (ATARAX) 50 MG tablet    mirtazapine (REMERON) 45 MG tablet       Past Psychiatric History: OCD, adjustment disorder, anxiety, depression, SI, and bipolar disorder.   Past Medical History:  Past Medical History:  Diagnosis Date   Acute renal failure ([place]) 05/27/10   hemodialysis for 6 weeks  Anxiety    Asthma    Depression    Diabetes mellitus    Hypertension    Rhabdomyolysis 06/06/10   after drug overdose   Suicide attempt by drug ingestion ([place]) 06/05/10   result rhabdomyolosis and ARF requrining dialysis     Past Surgical History:  Procedure Laterality Date   ANKLE ARTHROSCOPY Right 08/17/2015   Procedure: RIGHT ANKLE ARTHROSCOPY WITH SYNOVECTOMY AND LOOSE BODY EXCISION;  Surgeon: Melrose Nakayama, MD;  Location: [facility];  Service: Orthopedics;  Laterality: Right;   CHOLECYSTECTOMY     TUBAL  LIGATION  1998    Family Psychiatric History: Unknown  Family History:  Family History  Problem Relation Age of Onset   Asthma Father     Social History:  Social History   Socioeconomic History   Marital status: Married    Spouse name: Not on file   Number of children: 2   Years of education: Not on file   Highest education level: Not on file  Occupational History   Occupation: Corporate treasurer  Tobacco Use   Smoking status: Never    Passive exposure: Never   Smokeless tobacco: Never  Vaping Use   Vaping Use: Never used  Substance and Sexual Activity   Alcohol use: No   Drug use: No   Sexual activity: Yes    Comment: with husband   Other Topics Concern   Not on file  Social History Narrative   Married high school boyfriend at age 71, two boys, still married but he has been living with other women for years.   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         Social Determinants of Radio broadcast assistant Strain: Not on file  Food Insecurity: No Food Insecurity (02/24/2022)   Hunger Vital Sign    Worried About Running Out of Food in the Last Year: Never true    [place] in the Last Year: Never true  Transportation Needs: No Transportation Needs (02/24/2022)   PRAPARE - Hydrologist (Medical): No    Lack of Transportation (Non-Medical): No  Physical Activity: Not on file  Stress: Not on file  Social Connections: Not on file    Allergies:  Allergies  Allergen Reactions   Ace Inhibitors Other (See Comments)    Patient had acute renal failure requiring hemodialysis after a suicide attempt.  Nephro recommended avoiding use.   Haldol [Haloperidol Lactate] Other (See Comments)    Tardive dyskinesia   Nsaids Other (See Comments)    Nephro recommended avoiding after acute renal failure requiring hemodialysis associated with a suicide attempt.   Entresto [Sacubitril-Valsartan]  Itching   Latex Other (See Comments)    Rash around IV site    Metabolic Disorder Labs: Lab Results  Component Value Date   HGBA1C 9.7 (A) 12/24/2021   MPG 243.17 11/14/2017   MPG 186 05/09/2015   No results found for: "PROLACTIN" Lab Results  Component Value Date   CHOL 201 (H) 04/15/2021   TRIG 231 (H) 04/15/2021   HDL 49 04/15/2021   CHOLHDL 4.1 04/15/2021   VLDL 46 (H) 04/10/2015   LDLCALC 112 (H) 04/15/2021   LDLCALC 125 (H) 12/19/2020   Lab Results  Component Value Date   TSH 1.015 02/20/2022   TSH 2.689 05/19/2021    Therapeutic Level Labs: No results found for: "LITHIUM" No results found for: "VALPROATE" No  results found for: "CBMZ"  Current Medications: Current Outpatient Medications  Medication Sig Dispense Refill   albuterol (VENTOLIN HFA) 108 (90 Base) MCG/ACT inhaler Inhale 2 puffs into the lungs every 6 (six) hours as needed for shortness of breath. 18 g 1   blood glucose meter kit and supplies KIT Dispense based on patient and insurance preference. Use up to four times daily as directed. 1 each 0   dicyclomine (BENTYL) 20 MG tablet Take 1 tablet (20 mg total) by mouth 3 (three) times daily as needed for spasms (abdominal pain). 90 tablet 0   famotidine (PEPCID) 20 MG tablet Take 1 tablet (20 mg total) by mouth 2 (two) times daily. 60 tablet 0   FLUoxetine (PROZAC) 40 MG capsule Take 2 capsules (80 mg total) by mouth daily. 60 capsule 3   fluticasone (FLONASE) 50 MCG/ACT nasal spray Place 1 spray into both nostrils daily. 1 spray in each nostril every day 16 g 1   fluticasone furoate-vilanterol (BREO ELLIPTA) 200-25 MCG/ACT AEPB Inhale 1 puff into the lungs daily. 1 each 0   furosemide (LASIX) 40 MG tablet Take 1 tablet (40 mg total) by mouth daily. 30 tablet 0   hydrOXYzine (ATARAX) 50 MG tablet Take 1 tablet (50 mg total) by mouth 3 (three) times daily as needed for anxiety. 90 tablet 3   Insulin Glargine (BASAGLAR KWIKPEN) 100 UNIT/ML Inject 10 Units  into the skin daily. Take 65U twice a day 15 mL 0   isosorbide-hydrALAZINE (BIDIL) 20-37.5 MG tablet Take 1 tablet by mouth 3 (three) times daily. 90 tablet 0   levothyroxine (SYNTHROID) 300 MCG tablet Take 1 tablet (300 mcg total) by mouth daily at 6 (six) AM. 30 tablet 0   LORazepam (ATIVAN) 1 MG tablet Take 1 tablet (1 mg total) by mouth 2 (two) times daily as needed for anxiety. 45 tablet 0   metFORMIN (GLUCOPHAGE) 500 MG tablet Take 1 tablet (500 mg total) by mouth 2 (two) times daily with a meal. 60 tablet 0   metoprolol tartrate (LOPRESSOR) 25 MG tablet Take 1 tablet (25 mg total) by mouth 2 (two) times daily. 60 tablet 0   mirtazapine (REMERON) 45 MG tablet Take 1 tablet (45 mg total) by mouth at bedtime. 30 tablet 3   NOVOLOG FLEXPEN 100 UNIT/ML FlexPen Inject 15 units with lunch and 20 units with supper 15 mL 1   rosuvastatin (CRESTOR) 20 MG tablet Take 1 tablet (20 mg total) by mouth daily. 30 tablet 0   Semaglutide, 1 MG/DOSE, 4 MG/3ML SOPN Inject 1 mg into the skin once a week. 3 mL 0   Vitamin D, Ergocalciferol, (DRISDOL) 1.25 MG (50000 UNIT) CAPS capsule Take 1 capsule (50,000 Units total) by mouth every 7 (seven) days. Sunday 12 capsule 0   No current facility-administered medications for this visit.     Musculoskeletal: Strength & Muscle Tone: within normal limits, telehealth visit Gait & Station: normal, telehealth visit Patient leans: N/A  Psychiatric Specialty Exam: Review of Systems  There were no vitals taken for this visit.There is no height or weight on file to calculate BMI.  General Appearance: Well Groomed  Eye Contact:  Good  Speech:  Clear and Coherent and Normal Rate  Volume:  Normal  Mood:  Anxious and Depressed  Affect:  Appropriate and Congruent  Thought Process:  Coherent, Goal Directed and Linear  Orientation:  Full (Time, Place, and Person)  Thought Content: WDL and Logical   Suicidal Thoughts:  Yes.  without  intent/plan  Homicidal Thoughts:  No   Memory:  Immediate;   Good Recent;   Good Remote;   Good  Judgement:  Good  Insight:  Good  Psychomotor Activity:  Normal  Concentration:  Concentration: Good and Attention Span: Good  Recall:  Good  Fund of Knowledge: Good  Language: Good  Akathisia:  No  Handed:  Right  AIMS (if indicated): Not done  Assets:  Communication Skills Desire for Improvement Financial Resources/Insurance Housing Social Support  ADL's:  Intact  Cognition: WNL  Sleep:  Fair   Screenings: AIMS    Flowsheet Row Admission (Discharged) from 02/24/2022 in [facility] Total Score 0      AUDIT    Flowsheet Row Admission (Discharged) from 02/24/2022 in [facility]  Alcohol Use Disorder Identification Test Final Score (AUDIT) 0      GAD-7    Flowsheet Row Video Visit from 03/26/2022 in [facility] Counselor from 03/20/2022 in [facility] Video Visit from 11/12/2021 in [facility] Video Visit from 08/30/2020 in [facility] Video Visit from 06/07/2020 in [facility]  Total GAD-7 Score 14 7 5 2 21       PHQ2-9    Flowsheet Row Video Visit from 03/26/2022 in [facility] Counselor from 03/20/2022 in [facility] Office Visit from 03/12/2022 in [facility] Office Visit from 01/13/2022 in [facility] Office Visit from 12/24/2021 in [facility]  PHQ-2 Total Score 3 2 0 2 2  PHQ-9 Total Score 12 10 0 7 5      Flowsheet Row Video Visit from 03/26/2022 in [facility] Counselor from 03/20/2022 in [facility] Admission (Discharged) from 02/24/2022 in [facility] Error: Q3, 4, or 5 should not be populated  when Q2 is No Low Risk High Risk        Assessment and Plan: Patient endorses symptoms of anxiety, depression, passive SI, and poor appetite. Patient reports that she continues to be skeptical about therapy but is trying to do things differently.  She notes that she is waiting on a call to start her therapy.Today mirtazapine 30 mg increased to 45 mg to help manage sleep, anxiety, depression, and appetite.  Patient agreeable to continue all other medication as prescribe  1. Grief at loss of child   2. Mild depression  Continue- FLUoxetine (PROZAC) 40 MG capsule; Take 2 capsules (80 mg total) by mouth daily.  Dispense: 60 capsule; Refill: 3 Increased- mirtazapine (REMERON) 45 MG tablet; Take 1 tablet (45 mg total) by mouth at bedtime.  Dispense: 30 tablet; Refill: 3  3. Generalized anxiety disorder  Continue- FLUoxetine (PROZAC) 40 MG capsule; Take 2 capsules (80 mg total) by mouth daily.  Dispense: 60 capsule; Refill: 3 Continue- hydrOXYzine (ATARAX) 50 MG tablet; Take 1 tablet (50 mg total) by mouth 3 (three) times daily as needed for anxiety.  Dispense: 90 tablet; Refill: 3 Increased- mirtazapine (REMERON) 45 MG tablet; Take 1 tablet (45 mg total) by mouth at bedtime.  Dispense: 30 tablet; Refill: 3   Follow-up in 2 months  Salley Slaughter, NP 03/26/2022, 9:41 AM

## 2022-04-01 NOTE — Telephone Encounter (Signed)
Patient LVM on nurse line checking the status of paperwork.   I called patient back, however reached her VM.  Advised we are currently waiting on hospital records.

## 2022-04-07 NOTE — Telephone Encounter (Signed)
I have not received the records. I still have paper work. The best I could do is now fax it to [employer]. Which is still a week turn around time but it would be the only other option.

## 2022-04-07 NOTE — Telephone Encounter (Signed)
Patient calls nurse line again in regards to paperwork/records.   Patient is very anxious as her employer has been reaching out daily to her.

## 2022-04-08 NOTE — Telephone Encounter (Signed)
Patient presents to [facility] yesterday to pick up forms.   A copy was made by FO staff for batch scanning.   We will notify patient once hospital records are released.

## 2022-04-15 ENCOUNTER — Other Ambulatory Visit: Payer: Self-pay | Admitting: Family Medicine

## 2022-04-15 ENCOUNTER — Telehealth: Payer: Self-pay | Admitting: Family Medicine

## 2022-04-15 DIAGNOSIS — E118 Type 2 diabetes mellitus with unspecified complications: Secondary | ICD-10-CM

## 2022-04-15 NOTE — Telephone Encounter (Signed)
Patient dropped off short term disability forms to be completed. Last DOS wa 03/11/22. Placed in [employer].

## 2022-04-16 NOTE — Telephone Encounter (Signed)
Reviewed form and placed in PCP's box for completion.  .Franz Svec R Fumio Vandam, CMA  

## 2022-04-18 NOTE — Telephone Encounter (Signed)
Patient called and informed that forms are ready for pick up. Copy made and placed in batch scanning. Original placed at front desk for pick up.   Austen Oyster C Curby Carswell, RN  

## 2022-04-18 NOTE — Telephone Encounter (Signed)
Form completed. Placed in RN triage box.   Thank you! Ale

## 2022-04-22 ENCOUNTER — Encounter: Payer: Self-pay | Admitting: Family Medicine

## 2022-04-22 ENCOUNTER — Other Ambulatory Visit: Payer: Self-pay

## 2022-04-22 ENCOUNTER — Ambulatory Visit (INDEPENDENT_AMBULATORY_CARE_PROVIDER_SITE_OTHER): Payer: BC Managed Care – PPO | Admitting: Family Medicine

## 2022-04-22 VITALS — BP 123/76 | HR 92 | Ht 72.0 in | Wt 369.0 lb

## 2022-04-22 DIAGNOSIS — F332 Major depressive disorder, recurrent severe without psychotic features: Secondary | ICD-10-CM

## 2022-04-22 DIAGNOSIS — J45909 Unspecified asthma, uncomplicated: Secondary | ICD-10-CM

## 2022-04-22 DIAGNOSIS — E039 Hypothyroidism, unspecified: Secondary | ICD-10-CM

## 2022-04-22 DIAGNOSIS — E118 Type 2 diabetes mellitus with unspecified complications: Secondary | ICD-10-CM

## 2022-04-22 LAB — POCT GLYCOSYLATED HEMOGLOBIN (HGB A1C): HbA1c, POC (controlled diabetic range): 7.5 % — AB (ref 0.0–7.0)

## 2022-04-22 MED ORDER — INSULIN GLARGINE 100 UNIT/ML SOLOSTAR PEN: 65.0000 [IU] | PEN_INJECTOR | Freq: Two times a day (BID) | SUBCUTANEOUS | 2 refills | Status: DC

## 2022-04-22 MED ORDER — FLUTICASONE-SALMETEROL 250-50 MCG/ACT IN AEPB: 1.0000 | INHALATION_SPRAY | Freq: Two times a day (BID) | RESPIRATORY_TRACT | 1 refills | Status: DC

## 2022-04-22 MED ORDER — LEVOTHYROXINE SODIUM 300 MCG PO TABS: 300.0000 ug | ORAL_TABLET | Freq: Every day | ORAL | 2 refills | Status: DC

## 2022-04-22 MED ORDER — NOVOFINE PLUS PEN NEEDLE 32G X 4 MM MISC: 1.0000 | Freq: Three times a day (TID) | 1 refills | Status: DC

## 2022-04-22 NOTE — Progress Notes (Signed)
SUBJECTIVE:   CHIEF COMPLAINT / HPI:   [name] is a 52 y.o. female who presents to the [facility] clinic today to discuss the following concerns:   Medication Changes due to Insurance Formulary Changes [name] is in great spirits today, she feels mentally and physically well. She states that she received a letter from her insurance company noting that they will no longer cover her [place] but instead would like her to switch to Lantus. She has not yet run out of the [place], has a few more days. They also note that they will no longer cover her Symbicort but instead prefer [place] Inhub.   T2DM Due for Hgb A1c today. Currently taking Basaglar 65U BID, Novolog 15U with lunch, 20U with dinner, Ozempic 1 mg weekly. Adherent to medications. Metformin stopped due to renal function, GFR <30.  Lab Results  Component Value Date   HGBA1C 9.7 (A) 12/24/2021    PERTINENT  PMH / PSH: T2DM, class III obesity, hypothyroidism, MDD with SI attempt via overdose, bipolar disorder, CKD 3, asthma  OBJECTIVE:   BP 123/76   Pulse 92   Ht 6' (1.829 m)   Wt (!) 369 lb (167.4 kg)   SpO2 100%   BMI 50.05 kg/m    General: NAD, pleasant, able to participate in exam Respiratory:normal effort Psych: Normal affect and mood     04/22/2022    3:44 PM 03/26/2022    9:05 AM 03/22/2022   12:52 PM  Depression screen PHQ 2/9  Decreased Interest 1    Down, Depressed, Hopeless 1    PHQ - 2 Score 2    Altered sleeping 1    Tired, decreased energy 1    Change in appetite 1    Feeling bad or failure about yourself  0    Trouble concentrating 1    Moving slowly or fidgety/restless 0    Suicidal thoughts 0    PHQ-9 Score 6    Difficult doing work/chores        Information is confidential and restricted. Go to Review Flowsheets to unlock data.     ASSESSMENT/PLAN:   1. Type II diabetes mellitus with complication (HCC) Hgb 123456 7.5, much improved from last check in October which was 9.7.  Congratulated patient, she was very excited about this news. She seems very motivated to continue to make healthy lifestyle changes. She is down 8 lbs since last visit. Doing well on medications. Will switch from [place] to Lantus due to insurance formulary - insulin glargine (LANTUS) 100 UNIT/ML Solostar Pen; Inject 65 Units into the skin 2 (two) times daily.  Dispense: 15 mL; Refill: 2 - Insulin Pen Needle (NOVOFINE PLUS PEN NEEDLE) 32G X 4 MM MISC; 1 Needle by Does not apply route 3 (three) times daily.  Dispense: 100 each; Refill: 1 - HgB A1c, next due in May   2. Asthma, unspecified asthma severity, unspecified whether complicated, unspecified whether persistent Will change from Symbicort to [place] inhub due to formulary change.  - fluticasone-salmeterol (WIX[place]A INHUB) 250-50 MCG/ACT AEPB; Inhale 1 puff into the lungs in the morning and at bedtime.  Dispense: 60 each; Refill: 1  3. MDD (major depressive disorder), recurrent severe, without psychosis ([place]) PHQ-9 of 6 today. No SI. She was in great spirits today. She follows with Psychiatry regularly. We did discuss that it would be good for her to continue tapering down from her ativan with time, recognizing that she has been on this for a  while. We discussed the potential harms of this medication as she ages. She seems amenable to this plan for the future.  4. Hypothyroidism, unspecified type Last TSH 02/2022 was normal. Continue current dose of Synthroid. - levothyroxine (SYNTHROID) 300 MCG tablet; Take 1 tablet (300 mcg total) by mouth daily at 6 (six) AM.  Dispense: 90 tablet; Refill: [place]

## 2022-04-22 NOTE — Patient Instructions (Addendum)
It was wonderful to see you today.  Please bring ALL of your medications with you to every visit.   Today we talked about:  I have sent over some Lantus and a new prescription for [place].  I have sent a refill for your thyroid medication.  You can restart the over the counter Vitamin D.   Your A1c today was ** I recommend checking your sugars and keep a log!   Thank you for coming to your visit as scheduled. We have had a large "no-show" problem lately, and this significantly limits our ability to see and care for patients. As a friendly reminder- if you cannot make your appointment please call to cancel. We do have a no show policy for those who do not cancel within 24 hours. Our policy is that if you miss or fail to cancel an appointment within 24 hours, 3 times in a 21-monthperiod, you may be dismissed from our clinic.   [facility] you for choosing [facility]   Please call [phone] any questions about today's appointment.  Please be sure to schedule follow up at the front  desk before you leave today.   ASharion Settler DO PGY-3 Family Medicine

## 2022-04-23 ENCOUNTER — Telehealth: Payer: Self-pay

## 2022-04-23 MED ORDER — INSULIN PEN NEEDLE 32G X 4 MM MISC: 1.0000 | Freq: Three times a day (TID) | 2 refills | Status: DC

## 2022-04-23 NOTE — Addendum Note (Signed)
Addended by: [name] on: 04/23/2022 09:55 AM   Modules accepted: Orders

## 2022-04-23 NOTE — Telephone Encounter (Signed)
Pharmacy LVM on nurse line in regards to pen needles.   Pharmacy reports Novofine Plus pen needles are no longer available and requesting an alternative.   Pharmacy suggests Novofine BD 32G x 4MM.   Will forward to PCP.

## 2022-04-23 NOTE — Telephone Encounter (Signed)
Patient calls nurse line in regards to [place].   Patient is requesting a refill and reports she would like to go up to the 61m dose.   Will forward to PCP.   WInsurance claims handler

## 2022-04-24 ENCOUNTER — Other Ambulatory Visit: Payer: Self-pay | Admitting: Family Medicine

## 2022-04-24 MED ORDER — OZEMPIC (2 MG/DOSE) 8 MG/3ML ~~LOC~~ SOPN: 1.0000 mg | PEN_INJECTOR | SUBCUTANEOUS | 2 refills | Status: DC

## 2022-04-24 MED ORDER — OZEMPIC (2 MG/DOSE) 8 MG/3ML ~~LOC~~ SOPN: PEN_INJECTOR | SUBCUTANEOUS | 2 refills | Status: DC

## 2022-04-24 NOTE — Telephone Encounter (Signed)
Spoke with Dr. Nita Sells regarding prescription. Received verbal order to update directions to 2 mg weekly.   Talbot Grumbling, RN

## 2022-04-24 NOTE — Telephone Encounter (Signed)
Rx sent in for 41m dose weekly.

## 2022-05-12 ENCOUNTER — Other Ambulatory Visit: Payer: Self-pay | Admitting: Family Medicine

## 2022-05-14 ENCOUNTER — Encounter: Payer: Self-pay | Admitting: Family Medicine

## 2022-05-21 ENCOUNTER — Encounter (HOSPITAL_COMMUNITY): Payer: Self-pay | Admitting: Psychiatry

## 2022-05-21 ENCOUNTER — Telehealth (INDEPENDENT_AMBULATORY_CARE_PROVIDER_SITE_OTHER): Payer: BC Managed Care – PPO | Admitting: Psychiatry

## 2022-05-21 DIAGNOSIS — F32A Depression, unspecified: Secondary | ICD-10-CM | POA: Diagnosis not present

## 2022-05-21 DIAGNOSIS — F411 Generalized anxiety disorder: Secondary | ICD-10-CM

## 2022-05-21 MED ORDER — FLUOXETINE HCL 40 MG PO CAPS: 80.0000 mg | ORAL_CAPSULE | Freq: Every day | ORAL | 3 refills | Status: DC

## 2022-05-21 MED ORDER — MIRTAZAPINE 45 MG PO TABS: 45.0000 mg | ORAL_TABLET | Freq: Every day | ORAL | 3 refills | Status: DC

## 2022-05-21 MED ORDER — HYDROXYZINE HCL 50 MG PO TABS: 50.0000 mg | ORAL_TABLET | Freq: Three times a day (TID) | ORAL | 3 refills | Status: DC | PRN

## 2022-05-21 NOTE — Progress Notes (Signed)
[place] MD/PA/NP OP Progress Note Virtual Visit via Video Note  I connected with [name] on 05/21/22 at  4:00 PM EDT by a video enabled telemedicine application and verified that I am speaking with the correct person using two identifiers.  Location: Patient:work Provider: Clinic   I discussed the limitations of evaluation and management by telemedicine and the availability of in person appointments. The patient expressed understanding and agreed to proceed.  I provided 30 minutes of non-face-to-face time during this encounter.       05/21/2022 7:04 PM [name]  MRN:  [id number]  Chief Complaint: "I am better "  HPI: 52 year old female seen today for follow up psychiatric evaluation. She has a psychiatric history of  OCD, adjustment disorder, anxiety, depression, SI, and bipolar disorder. She is currently being managed on  Prozac 80 mg daily, Vistaril 50 mg three times daily, and mirtazapine 30 mg at bedtime.  She informed Probation officer that her medications are effective in managing her psychiatric conditions.  Today she was well-groomed, pleasant, cooperative, and engaged in conversation.  Patient informed Probation officer that she is doing better.  She notes at times she has to push herself to do activities but notes that she does.  Patient informed Probation officer that having longer days where the son is helps manage her depression.  Today provider conducted a GAD-7 and patient scored a 12, at her last visit she scored a 14.  Provider also conducted PHQ-9 and patient scored a 13.  She endorses adequate sleep and appetite.  Today she denies SI/HI/VAH, mania, paranoia.    No medication changes made today.  Patient agreeable to taking medication as prescribed.  She will follow-up with outpatient counseling for therapy.  No other concerns noted at this time.    Visit Diagnosis:    ICD-10-CM   1. Generalized anxiety disorder  F41.1 hydrOXYzine (ATARAX) 50 MG tablet    mirtazapine (REMERON) 45 MG  tablet    FLUoxetine (PROZAC) 40 MG capsule    2. Mild depression  F32.A mirtazapine (REMERON) 45 MG tablet    FLUoxetine (PROZAC) 40 MG capsule       Past Psychiatric History: OCD, adjustment disorder, anxiety, depression, SI, and bipolar disorder.   Past Medical History:  Past Medical History:  Diagnosis Date   Acute renal failure ([place]) 05/27/10   hemodialysis for 6 weeks   Anxiety    Asthma    Depression    Diabetes mellitus    Hypertension    Rhabdomyolysis 06/06/10   after drug overdose   Suicide attempt by drug ingestion ([place]) 06/05/10   result rhabdomyolosis and ARF requrining dialysis     Past Surgical History:  Procedure Laterality Date   ANKLE ARTHROSCOPY Right 08/17/2015   Procedure: RIGHT ANKLE ARTHROSCOPY WITH SYNOVECTOMY AND LOOSE BODY EXCISION;  Surgeon: Melrose Nakayama, MD;  Location: [place];  Service: Orthopedics;  Laterality: Right;   CHOLECYSTECTOMY     TUBAL LIGATION  1998    Family Psychiatric History: Unknown  Family History:  Family History  Problem Relation Age of Onset   Asthma Father     Social History:  Social History   Socioeconomic History   Marital status: Married    Spouse name: Not on file   Number of children: 2   Years of education: Not on file   Highest education level: Not on file  Occupational History   Occupation: Corporate treasurer  Tobacco Use   Smoking status: Never    Passive exposure:  Never   Smokeless tobacco: Never  Vaping Use   Vaping Use: Never used  Substance and Sexual Activity   Alcohol use: No   Drug use: No   Sexual activity: Yes    Comment: with husband   Other Topics Concern   Not on file  Social History Narrative   Married high school boyfriend at age 13, two boys, still married but he has been living with other women for years.   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         Social Determinants of Radio broadcast assistant Strain: Not on file   Food Insecurity: No Food Insecurity (02/24/2022)   Hunger Vital Sign    Worried About Running Out of Food in the Last Year: Never true    [place] in the Last Year: Never true  Transportation Needs: No Transportation Needs (02/24/2022)   PRAPARE - Hydrologist (Medical): No    Lack of Transportation (Non-Medical): No  Physical Activity: Not on file  Stress: Not on file  Social Connections: Not on file    Allergies:  Allergies  Allergen Reactions   Ace Inhibitors Other (See Comments)    Patient had acute renal failure requiring hemodialysis after a suicide attempt.  Nephro recommended avoiding use.   Haldol [Haloperidol Lactate] Other (See Comments)    Tardive dyskinesia   Nsaids Other (See Comments)    Nephro recommended avoiding after acute renal failure requiring hemodialysis associated with a suicide attempt.   Entresto [Sacubitril-Valsartan] Itching   Latex Other (See Comments)    Rash around IV site    Metabolic Disorder Labs: Lab Results  Component Value Date   HGBA1C 7.5 (A) 04/22/2022   MPG 243.17 11/14/2017   MPG 186 05/09/2015   No results found for: "PROLACTIN" Lab Results  Component Value Date   CHOL 201 (H) 04/15/2021   TRIG 231 (H) 04/15/2021   HDL 49 04/15/2021   CHOLHDL 4.1 04/15/2021   VLDL 46 (H) 04/10/2015   LDLCALC 112 (H) 04/15/2021   LDLCALC 125 (H) 12/19/2020   Lab Results  Component Value Date   TSH 1.015 02/20/2022   TSH 2.689 05/19/2021    Therapeutic Level Labs: No results found for: "LITHIUM" No results found for: "VALPROATE" No results found for: "CBMZ"  Current Medications: Current Outpatient Medications  Medication Sig Dispense Refill   albuterol (VENTOLIN HFA) 108 (90 Base) MCG/ACT inhaler Inhale 2 puffs into the lungs every 6 (six) hours as needed for shortness of breath. 18 g 1   blood glucose meter kit and supplies KIT Dispense based on patient and insurance preference. Use up to four  times daily as directed. 1 each 0   dicyclomine (BENTYL) 20 MG tablet Take 1 tablet (20 mg total) by mouth 3 (three) times daily as needed for spasms (abdominal pain). (Patient not taking: Reported on 04/22/2022) 90 tablet 0   famotidine (PEPCID) 20 MG tablet Take 1 tablet (20 mg total) by mouth 2 (two) times daily. 60 tablet 0   FLUoxetine (PROZAC) 40 MG capsule Take 2 capsules (80 mg total) by mouth daily. 60 capsule 3   fluticasone (FLONASE) 50 MCG/ACT nasal spray Place 1 spray into both nostrils daily. 1 spray in each nostril every day 16 g 1   fluticasone-salmeterol (WIXELA INHUB) 250-50 MCG/ACT AEPB Inhale 1 puff into the lungs in the morning and at bedtime. [place]  each 1   furosemide (LASIX) 40 MG tablet Take 1 tablet (40 mg total) by mouth daily. 30 tablet 0   hydrOXYzine (ATARAX) 50 MG tablet Take 1 tablet (50 mg total) by mouth 3 (three) times daily as needed for anxiety. 90 tablet 3   insulin glargine (LANTUS) 100 UNIT/ML Solostar Pen Inject 65 Units into the skin 2 (two) times daily. 15 mL 2   Insulin Pen Needle 32G X 4 MM MISC 1 each by Does not apply route 3 (three) times daily. 300 each 2   isosorbide-hydrALAZINE (BIDIL) 20-37.5 MG tablet Take 1 tablet by mouth 3 (three) times daily. 90 tablet 0   levothyroxine (SYNTHROID) 300 MCG tablet Take 1 tablet (300 mcg total) by mouth daily at 6 (six) AM. 90 tablet 2   LORazepam (ATIVAN) 1 MG tablet Take 1 tablet by mouth twice daily as needed for anxiety 45 tablet 0   metFORMIN (GLUCOPHAGE) 500 MG tablet Take 1 tablet (500 mg total) by mouth 2 (two) times daily with a meal. 60 tablet 0   metoprolol tartrate (LOPRESSOR) 25 MG tablet Take 1 tablet (25 mg total) by mouth 2 (two) times daily. 60 tablet 0   mirtazapine (REMERON) 45 MG tablet Take 1 tablet (45 mg total) by mouth at bedtime. 30 tablet 3   NOVOLOG FLEXPEN 100 UNIT/ML FlexPen Inject 15 units with lunch and 20 units with supper 15 mL 1   rosuvastatin (CRESTOR) 20 MG tablet Take 1 tablet (20  mg total) by mouth daily. 30 tablet 0   Semaglutide, 2 MG/DOSE, (OZEMPIC, 2 MG/DOSE,) 8 MG/3ML SOPN Inject 2 mg into the skin once per week. 3 mL 2   Vitamin D, Ergocalciferol, (DRISDOL) 1.25 MG (50000 UNIT) CAPS capsule Take 1 capsule (50,000 Units total) by mouth every 7 (seven) days. Sunday (Patient not taking: Reported on 04/22/2022) 12 capsule 0   No current facility-administered medications for this visit.     Musculoskeletal: Strength & Muscle Tone: within normal limits, telehealth visit Gait & Station: normal, telehealth visit Patient leans: N/A  Psychiatric Specialty Exam: Review of Systems  There were no vitals taken for this visit.There is no height or weight on file to calculate BMI.  General Appearance: Well Groomed  Eye Contact:  Good  Speech:  Clear and Coherent and Normal Rate  Volume:  Normal  Mood:  Anxious and Depressed Improving  Affect:  Appropriate and Congruent  Thought Process:  Coherent, Goal Directed and Linear  Orientation:  Full (Time, Place, and Person)  Thought Content: WDL and Logical   Suicidal Thoughts:  Yes.  without intent/plan  Homicidal Thoughts:  No  Memory:  Immediate;   Good Recent;   Good Remote;   Good  Judgement:  Good  Insight:  Good  Psychomotor Activity:  Normal  Concentration:  Concentration: Good and Attention Span: Good  Recall:  Good  Fund of Knowledge: Good  Language: Good  Akathisia:  No  Handed:  Right  AIMS (if indicated): Not done  Assets:  Communication Skills Desire for Improvement Financial Resources/Insurance Housing Social Support  ADL's:  Intact  Cognition: WNL  Sleep:  Fair   Screenings: AIMS    Flowsheet Row Admission (Discharged) from 02/24/2022 in [facility] Total Score 0      AUDIT    Flowsheet Row Admission (Discharged) from 02/24/2022 in [facility]  Alcohol Use Disorder Identification Test Final Score (AUDIT) 0      GAD-7  Flowsheet Row Video Visit from 05/21/2022 in [facility] Video Visit from 03/26/2022 in [facility] Counselor from 03/20/2022 in [facility] Video Visit from 11/12/2021 in [facility] Video Visit from 08/30/2020 in [facility]  Total GAD-7 Score '12 14 7 5 2      '$ PHQ2-9    Flowsheet Row Video Visit from 05/21/2022 in [facility] Office Visit from 04/22/2022 in [place] Video Visit from 03/26/2022 in [facility] Counselor from 03/20/2022 in [facility] Office Visit from 03/12/2022 in [place]  PHQ-2 Total Score '4 2 3 2 '$ 0  PHQ-9 Total Score '13 6 12 10 '$ 0      Flowsheet Row Video Visit from 03/26/2022 in [facility] Counselor from 03/20/2022 in [facility] Admission (Discharged) from 02/24/2022 in [place] Error: Q3, 4, or 5 should not be populated when Q2 is No Low Risk High Risk        Assessment and Plan: Patient endorses symptoms of anxiety and depression. She however notes that theses conditions have improved as she feels mentally stable. No medication changes made today. Patient agreeable to continue medications as prescribed.  1. Generalized anxiety disorder  Continue- hydrOXYzine (ATARAX) 50 MG tablet; Take 1 tablet (50 mg total) by mouth 3 (three) times daily as needed for anxiety.  Dispense: 90 tablet; Refill: 3 Continue- mirtazapine (REMERON) 45 MG tablet; Take 1 tablet (45 mg total) by mouth at bedtime.  Dispense: 30 tablet; Refill: 3 Continue- FLUoxetine (PROZAC) 40 MG capsule; Take 2 capsules (80 mg total) by mouth daily.  Dispense: 60 capsule; Refill: 3  2. Mild depression  Continue- mirtazapine  (REMERON) 45 MG tablet; Take 1 tablet (45 mg total) by mouth at bedtime.  Dispense: 30 tablet; Refill: 3 Continue- FLUoxetine (PROZAC) 40 MG capsule; Take 2 capsules (80 mg total) by mouth daily.  Dispense: 60 capsule; Refill: 3    Follow-up in 2 months  Salley Slaughter, NP 05/21/2022, 7:04 PM

## 2022-05-28 ENCOUNTER — Other Ambulatory Visit: Payer: Self-pay | Admitting: Family Medicine

## 2022-05-28 DIAGNOSIS — E118 Type 2 diabetes mellitus with unspecified complications: Secondary | ICD-10-CM

## 2022-06-05 ENCOUNTER — Ambulatory Visit (INDEPENDENT_AMBULATORY_CARE_PROVIDER_SITE_OTHER): Payer: BC Managed Care – PPO | Admitting: Family Medicine

## 2022-06-05 ENCOUNTER — Encounter: Payer: Self-pay | Admitting: Family Medicine

## 2022-06-05 VITALS — BP 129/77 | HR 93 | Ht 72.0 in | Wt 354.0 lb

## 2022-06-05 DIAGNOSIS — M5441 Lumbago with sciatica, right side: Secondary | ICD-10-CM

## 2022-06-05 MED ORDER — TIZANIDINE HCL 2 MG PO TABS: 2.0000 mg | ORAL_TABLET | Freq: Three times a day (TID) | ORAL | 0 refills | Status: DC | PRN

## 2022-06-05 NOTE — Patient Instructions (Addendum)
It was nice seeing you today!  Take Tylenol every 6 hours.  Take tizanidine every 8 hours as needed.  If not getting better by next week, return for follow-up.  Stay well, Zola Button, MD [place] [phone]  --  Make sure to check out at the front desk before you leave today.  Please arrive at least 15 minutes prior to your scheduled appointments.  If you had blood work today, I will send you a MyChart message or a letter if results are normal. Otherwise, I will give you a call.  If you had a referral placed, they will call you to set up an appointment. Please give [name] a call if you don't hear back in the next 2 weeks.  If you need additional refills before your next appointment, please call your pharmacy first.

## 2022-06-05 NOTE — Progress Notes (Signed)
    SUBJECTIVE:   CHIEF COMPLAINT / HPI:  Chief Complaint  Patient presents with   Back Pain   Butt cramps     Patient woke up with back pain last week 8 days ago, pain radiates down right leg as of today. Also has had cramping in the right butt cheek. Has had chills.  Taking Tylenol without relief. Denies fever.  PERTINENT  PMH / PSH: CKD stage III  Patient Care Team: [name], DO as PCP - General (Family Medicine) Janina Mayo, MD as PCP - Cardiology (Cardiology)   OBJECTIVE:   BP 129/77   Pulse 93   Ht 6' (1.829 m)   Wt (!) 354 lb (160.6 kg)   LMP 05/30/2022   SpO2 99%   BMI 48.01 kg/m   Physical Exam Constitutional:      General: She is not in acute distress. Cardiovascular:     Rate and Rhythm: Normal rate and regular rhythm.  Pulmonary:     Effort: Pulmonary effort is normal. No respiratory distress.     Breath sounds: Normal breath sounds.  Musculoskeletal:     Cervical back: Neck supple.     Comments: Back: No obvious deformity. No tenderness to palpation along midline spine or paraspinal muscles. Full flexion without pain.  Extension severely limited by pain.  Due to pain unable to test lateral flexion and rotation. Unable to tolerate straight leg raise bilaterally due to pain.  Neurological:     Mental Status: She is alert.         05/21/2022    4:00 PM  Depression screen PHQ 2/9  Decreased Interest   Down, Depressed, Hopeless   PHQ - 2 Score   Altered sleeping   Tired, decreased energy   Change in appetite   Feeling bad or failure about yourself    Trouble concentrating   Moving slowly or fidgety/restless   Suicidal thoughts   PHQ-9 Score   Difficult doing work/chores      Information is confidential and restricted. Go to Review Flowsheets to unlock data.     Wt Readings from Last 3 Encounters:  06/05/22 (!) 354 lb (160.6 kg)  04/22/22 (!) 369 lb (167.4 kg)  03/12/22 (!) 377 lb (171 kg)        ASSESSMENT/PLAN:    1. Acute right-sided low back pain with right-sided sciatica Atraumatic lower right back pain for about 1 week, suspect there may be some element of muscle spasm.  Possibly some sciatic component as well though unable to tolerate exam.  No red flag signs.  Treat symptomatically for now, I think she would benefit from further weight loss and physical therapy/core strengthening once feeling better. - tiZANidine (ZANAFLEX) 2 MG tablet; Take 1 tablet (2 mg total) by mouth every 8 (eight) hours as needed for muscle spasms.  Dispense: 30 tablet; Refill: 0  - scheduled acetaminophen - avoiding NSAIDs due to CKD stage III  Return if symptoms worsen or fail to improve.   Zola Button, MD [place]

## 2022-06-09 ENCOUNTER — Ambulatory Visit: Payer: BC Managed Care – PPO

## 2022-06-12 ENCOUNTER — Other Ambulatory Visit: Payer: Self-pay | Admitting: Family Medicine

## 2022-06-14 ENCOUNTER — Emergency Department (HOSPITAL_BASED_OUTPATIENT_CLINIC_OR_DEPARTMENT_OTHER)
Admission: EM | Admit: 2022-06-14 | Discharge: 2022-06-15 | Disposition: A | Discharge status: Home / Self Care | Payer: BC Managed Care – PPO | Attending: Emergency Medicine | Admitting: Emergency Medicine

## 2022-06-14 ENCOUNTER — Other Ambulatory Visit: Payer: Self-pay

## 2022-06-14 DIAGNOSIS — Z91040 Latex allergy status: Secondary | ICD-10-CM | POA: Insufficient documentation

## 2022-06-14 DIAGNOSIS — M545 Low back pain, unspecified: Secondary | ICD-10-CM | POA: Diagnosis present

## 2022-06-14 DIAGNOSIS — M5442 Lumbago with sciatica, left side: Secondary | ICD-10-CM | POA: Insufficient documentation

## 2022-06-14 DIAGNOSIS — M5432 Sciatica, left side: Secondary | ICD-10-CM

## 2022-06-14 MED ORDER — HYDROMORPHONE HCL 1 MG/ML IJ SOLN
1.0000 mg | Freq: Once | INTRAMUSCULAR | Status: AC
  Administered 2022-06-14: 1 mg via INTRAMUSCULAR
  Filled 2022-06-14: qty 1

## 2022-06-14 MED ORDER — DEXAMETHASONE SODIUM PHOSPHATE 10 MG/ML IJ SOLN
10.0000 mg | Freq: Once | INTRAMUSCULAR | Status: AC
  Administered 2022-06-14: 10 mg via INTRAMUSCULAR
  Filled 2022-06-14: qty 1

## 2022-06-14 NOTE — ED Triage Notes (Signed)
Reports lower back pain that radiates down L leg x2 weeks. Denies injury. Ambulatory with cane.

## 2022-06-14 NOTE — ED Provider Notes (Signed)
[place] [facility]   CSN: [id number] Arrival date & time: 06/14/22  2010     History  Chief Complaint  Patient presents with   Back Pain    [name] is a 52 y.o. female.  HPI Patient reports she has had lower back pain for about 2 weeks.  She reports is going down her left leg.  She reports its gotten a lot worse and now is all the way down to her foot.  Pain is severe if she tries to move in certain directions.  She is able to walk but now she is using a cane to help with pain and mobility.  No abdominal pain.  No urinary symptoms.  Her doctor started treating her with muscle relaxer and anti-inflammatory but she reports is not helping.    Home Medications Prior to Admission medications   Medication Sig Start Date End Date Taking? Authorizing Provider  methylPREDNISolone (MEDROL DOSEPAK) 4 MG TBPK tablet Take per dose pack instruction 06/15/22  Yes Adlyn Fife, Lebron ConnersMarcy, MD  oxyCODONE-acetaminophen (PERCOCET/ROXICET) 5-325 MG tablet Take 1 tablet by mouth every 6 (six) hours as needed for severe pain. 06/15/22  Yes Arby BarrettePfeiffer, Keesha Pellum, MD  albuterol (VENTOLIN HFA) 108 (90 Base) MCG/ACT inhaler Inhale 2 puffs into the lungs every 6 (six) hours as needed for shortness of breath. 02/26/22   Clapacs, Jackquline DenmarkJohn T, MD  blood glucose meter kit and supplies KIT Dispense based on patient and insurance preference. Use up to four times daily as directed. 10/01/21   Sabino DickEspinoza, Alejandra, DO  dicyclomine (BENTYL) 20 MG tablet Take 1 tablet (20 mg total) by mouth 3 (three) times daily as needed for spasms (abdominal pain). Patient not taking: Reported on 04/22/2022 03/12/22   Sabino DickEspinoza, Alejandra, DO  famotidine (PEPCID) 20 MG tablet Take 1 tablet (20 mg total) by mouth 2 (two) times daily. 02/26/22   Clapacs, Jackquline DenmarkJohn T, MD  FLUoxetine (PROZAC) 40 MG capsule Take 2 capsules (80 mg total) by mouth daily. 05/21/22   Shanna CiscoParsons, Brittney E, NP  fluticasone (FLONASE) 50  MCG/ACT nasal spray Place 1 spray into both nostrils daily. 1 spray in each nostril every day 06/10/21   Sabino DickEspinoza, Alejandra, DO  fluticasone-salmeterol (WIXELA INHUB) 250-50 MCG/ACT AEPB Inhale 1 puff into the lungs in the morning and at bedtime. 04/22/22   Sabino DickEspinoza, Alejandra, DO  furosemide (LASIX) 40 MG tablet Take 1 tablet (40 mg total) by mouth daily. 02/26/22   Clapacs, Jackquline DenmarkJohn T, MD  hydrOXYzine (ATARAX) 50 MG tablet Take 1 tablet (50 mg total) by mouth 3 (three) times daily as needed for anxiety. 05/21/22   Shanna CiscoParsons, Brittney E, NP  insulin glargine (LANTUS) 100 UNIT/ML Solostar Pen Inject 65 Units into the skin 2 (two) times daily. 04/22/22   Sabino DickEspinoza, Alejandra, DO  Insulin Pen Needle 32G X 4 MM MISC 1 each by Does not apply route 3 (three) times daily. 04/23/22   Sabino DickEspinoza, Alejandra, DO  isosorbide-hydrALAZINE (BIDIL) 20-37.5 MG tablet Take 1 tablet by mouth 3 (three) times daily. 02/26/22   Clapacs, Jackquline DenmarkJohn T, MD  levothyroxine (SYNTHROID) 300 MCG tablet Take 1 tablet (300 mcg total) by mouth daily at 6 (six) AM. 04/22/22   Sabino DickEspinoza, Alejandra, DO  LORazepam (ATIVAN) 1 MG tablet Take 1 tablet by mouth twice daily as needed for anxiety 06/13/22   Sabino DickEspinoza, Alejandra, DO  metFORMIN (GLUCOPHAGE) 500 MG tablet Take 1 tablet (500 mg total) by mouth 2 (two) times daily with a meal. 02/26/22  Clapacs, Jackquline Denmark, MD  metoprolol tartrate (LOPRESSOR) 25 MG tablet Take 1 tablet (25 mg total) by mouth 2 (two) times daily. 02/26/22   Clapacs, Jackquline Denmark, MD  mirtazapine (REMERON) 45 MG tablet Take 1 tablet (45 mg total) by mouth at bedtime. 05/21/22   Shanna Cisco, NP  NOVOLOG FLEXPEN 100 UNIT/ML FlexPen INJECT 15 UNITS SUBCUTANEOUSLY WITH LUNCH AND 20 UNITS WITH SUPPER 05/29/22   Sabino Dick, DO  rosuvastatin (CRESTOR) 20 MG tablet Take 1 tablet (20 mg total) by mouth daily. 02/26/22   Clapacs, Jackquline Denmark, MD  Semaglutide, 2 MG/DOSE, (OZEMPIC, 2 MG/DOSE,) 8 MG/3ML SOPN Inject 2 mg into the skin once per week.  04/24/22   Sabino Dick, DO  tiZANidine (ZANAFLEX) 2 MG tablet Take 1 tablet (2 mg total) by mouth every 8 (eight) hours as needed for muscle spasms. 06/05/22   Littie Deeds, MD  Vitamin D, Ergocalciferol, (DRISDOL) 1.25 MG (50000 UNIT) CAPS capsule Take 1 capsule (50,000 Units total) by mouth every 7 (seven) days. Sunday Patient not taking: Reported on 04/22/2022 02/26/22   Clapacs, Jackquline Denmark, MD  ipratropium-albuterol (DUONEB) 0.5-2.5 (3) MG/3ML SOLN Take 3 mLs by nebulization 3 (three) times daily as needed. Patient taking differently: Take 3 mLs by nebulization 3 (three) times daily as needed (sob/wheezing). 10/12/20 05/21/21  Fayrene Helper, PA-C      Allergies    Ace inhibitors, Haldol [haloperidol lactate], Nsaids, Entresto [sacubitril-valsartan], and Latex    Review of Systems   Review of Systems  Physical Exam Updated Vital Signs BP (!) 128/94   Pulse 91   Temp 98.3 F (36.8 C) (Oral)   Resp 18   Ht 5\' 11"  (1.803 m)   Wt (!) 158.8 kg   LMP 05/30/2022   SpO2 96%   BMI 48.82 kg/m  Physical Exam Constitutional:      Appearance: Normal appearance.  HENT:     Mouth/Throat:     Pharynx: Oropharynx is clear.  Cardiovascular:     Rate and Rhythm: Normal rate and regular rhythm.  Pulmonary:     Effort: Pulmonary effort is normal.     Breath sounds: Normal breath sounds.  Abdominal:     General: There is no distension.     Palpations: Abdomen is soft.     Tenderness: There is no abdominal tenderness. There is no guarding.  Musculoskeletal:        General: Normal range of motion.     Comments: Initially patient has severe pain with any movement trying to roll or sit up.  No peripheral edema.  Calf soft and nontender.  Skin:    General: Skin is warm and dry.  Neurological:     General: No focal deficit present.     Mental Status: She is alert and oriented to person, place, and time.     Motor: No weakness.  Psychiatric:        Mood and Affect: Mood normal.     ED  Results / Procedures / Treatments   Labs (all labs ordered are listed, but only abnormal results are displayed) Labs Reviewed - No data to display  EKG None  Radiology No results found.  Procedures Procedures    Medications Ordered in ED Medications  dexamethasone (DECADRON) injection 10 mg (10 mg Intramuscular Given 06/14/22 2240)  HYDROmorphone (DILAUDID) injection 1 mg (1 mg Intramuscular Given 06/14/22 2240)    ED Course/ Medical Decision Making/ A&P  Medical Decision Making Risk Prescription drug management.  Patient presents with back pain.  This been present for 2 weeks.  It started in her left lower back she indicates the SI region.  Initially was down to the thigh and now down to the foot with numbness and radiating pain.  Patient does not have any neurologic deficits.  No bowel or bladder dysfunction.  Findings are very suggestive of sciatica.  Consideration for disc herniation\discitis\musculoskeletal strain.  Will treat for pain with a dose of Decadron and Dilaudid.  Recheck 12: 15 patient's pain is much improved.  She is now sitting comfortably at the edge of the stretcher talking with her friend.  At this time stable for discharge with follow-up plan next week with PCP.  Patient has significant pain relief with Decadron Dilaudid.  There is no neurologic dysfunction.  No clinical indication of infection or risk factors for infection.  Low suspicion for epidural hematoma or discitis or critical nerve impingement.  With patient well-controlled for pain and clinically well I do feel she stable for follow-up.  We discussed follow-up with possible MRI for further evaluation of what sounds very consistent with sciatica.  At this time I do not feel that patient needs emergent imaging.  We reviewed return precautions and she is discharged in good condition.        Final Clinical Impression(s) / ED Diagnoses Final diagnoses:  Sciatica of left side     Rx / DC Orders ED Discharge Orders          Ordered    oxyCODONE-acetaminophen (PERCOCET/ROXICET) 5-325 MG tablet  Every 6 hours PRN        06/15/22 0012    methylPREDNISolone (MEDROL DOSEPAK) 4 MG TBPK tablet        06/15/22 0012              Arby BarrettePfeiffer, Liese Dizdarevic, MD 06/25/22 1043

## 2022-06-15 MED ORDER — OXYCODONE-ACETAMINOPHEN 5-325 MG PO TABS: 1.0000 | ORAL_TABLET | Freq: Four times a day (QID) | ORAL | 0 refills | Status: DC | PRN

## 2022-06-15 MED ORDER — METHYLPREDNISOLONE 4 MG PO TBPK: ORAL_TABLET | ORAL | 0 refills | Status: DC

## 2022-06-15 NOTE — Discharge Instructions (Signed)
1.  Start your Medrol Dosepak tomorrow.  Pay close attention to your blood sugars.  Steroids can elevate your blood sugar. 2.  See your family doctor as scheduled this week. 3.  Review information for sciatica.  Return if new or worsening symptoms.

## 2022-06-16 ENCOUNTER — Ambulatory Visit (INDEPENDENT_AMBULATORY_CARE_PROVIDER_SITE_OTHER): Payer: BC Managed Care – PPO | Admitting: Family Medicine

## 2022-06-16 ENCOUNTER — Encounter: Payer: Self-pay | Admitting: Family Medicine

## 2022-06-16 VITALS — BP 116/63 | HR 98 | Ht 71.0 in | Wt 358.8 lb

## 2022-06-16 DIAGNOSIS — M5432 Sciatica, left side: Secondary | ICD-10-CM | POA: Diagnosis not present

## 2022-06-16 DIAGNOSIS — E118 Type 2 diabetes mellitus with unspecified complications: Secondary | ICD-10-CM | POA: Diagnosis not present

## 2022-06-16 MED ORDER — CYCLOBENZAPRINE HCL 10 MG PO TABS: 10.0000 mg | ORAL_TABLET | Freq: Three times a day (TID) | ORAL | 0 refills | Status: DC | PRN

## 2022-06-16 NOTE — Progress Notes (Signed)
    SUBJECTIVE:   CHIEF COMPLAINT / HPI:   [name] is a 52 y.o. female who presents to the [facility] clinic today to discuss the following concerns:   Left Leg Pain Patient was last seen in our office on 3/28 for acute right-sided low back pain with sciatica.  She was given prescription for tizanidine and scheduled Tylenol.  She reports that tizanidine did help with her symptoms initially.  Her pain then went to her left leg.  She then presented to the emergency department on 4/6 for continued pain.  This time it was on her left side.  She was given a 10 mg IM dexamethasone injection and discharged with prescriptions for Medrol Dosepak and 20 tablets of Percocet to use every 6 hours as needed her pain.  She returns today for follow-up.  She reports that nothing has helped her pain thus far. She is having a hard time walking around, has been using a cane.  She did urinate on herself today because she couldn't make it to the bathroom in time. Was not because she didn't feel the urge. No saddle anesthesia. She has only taken Percocet twice. The Tizanidine seemed to help some.   No fevers, no IVDU. No bowel/bladder incontinence otherwise (except for one episode noted above).   PERTINENT  PMH / PSH: Hypertension, CHF, type 2 diabetes, hypothyroidism, OA, CKD stage 4   OBJECTIVE:   BP 116/63   Pulse 98   Ht 5\' 11"  (1.803 m)   Wt (!) 358 lb 12.8 oz (162.8 kg)   LMP 05/30/2022   SpO2 98%   BMI 50.04 kg/m    General: NAD, pleasant, able to participate in exam Respiratory:normal effort Extremities: no edema  MSK: Able to extend back to 45 degrees, extension is significantly limited.  Discomfort with sidebending to the left side, able to side bend to the right and appropriately.  Rotation is symmetric bilaterally. Gait is antalgic 2/2 pain.  5 out of 5 strength in lower extremities bilaterally Neuro: alert, normal sensation of b/l thighs and legs.  Does have decreased sensation bilaterally  of feet due to neuropathy. Psych: Intermittently tearful   ASSESSMENT/PLAN:   1. Left sided sciatica History and examination most consistent with sciatica.  No red flags today.  Does have good strength and sensation.  Encouraged stretches and PT to help, patient would like to defer PT for now given financial barriers.  No indication for further imaging today. - cyclobenzaprine (FLEXERIL) 10 MG tablet; Take 1 tablet (10 mg total) by mouth 3 (three) times daily as needed for up to 7 days for muscle spasms.  Dispense: 21 tablet; Refill: 0 -Recommended to limit use of Percocet as it is a narcotic medication and doesn't seem to be helping much- also given neuropathic pain  -Sciatica rehab exercises provided on AVS  -Work note provided, to return on 4/11  2. Type II diabetes mellitus with complication Due for next A1c check next month.  Encouraged her to check her sugars given that she is taking steroids.  -Continue current regimen: only on Insulin and Ozempic given renal function     Sabino Dick, DO [facility]

## 2022-06-16 NOTE — Patient Instructions (Addendum)
It was wonderful to see you today.  Please bring ALL of your medications with you to every visit.   Today we talked about:  Your symptoms seem most consistent with sciatica. I am prescribing another muscle relaxer to use up to three times a day as needed. Keep in mind that it can make you drowsy. If it makes you drowsy, take it at night only.  Below are stretches to help you rehab this. It will get better.   You are due for a diabetes follow up next month.   Thank you for coming to your visit as scheduled. We have had a large "no-show" problem lately, and this significantly limits our ability to see and care for patients. As a friendly reminder- if you cannot make your appointment please call to cancel. We do have a no show policy for those who do not cancel within 24 hours. Our policy is that if you miss or fail to cancel an appointment within 24 hours, 3 times in a 76-month period, you may be dismissed from our clinic.   Thank you for choosing [facility].   Please call [phone] with any questions about today's appointment.  Please be sure to schedule follow up at the front  desk before you leave today.   Sabino Dick, DO PGY-3 Family Medicine    Sciatica Rehab Ask your health care provider which exercises are safe for you. Do exercises exactly as told by your health care provider and adjust them as directed. It is normal to feel mild stretching, pulling, tightness, or discomfort as you do these exercises. Stop right away if you feel sudden pain or your pain gets worse. Do not begin these exercises until told by your health care provider. Stretching and range-of-motion exercises These exercises warm up your muscles and joints and improve the movement and flexibility of your hips and back. These exercises also help to relieve pain, numbness, and tingling. Sciatic nerve glide  Sit in a chair with your head facing down toward your chest. Place your hands behind  your back. Let your shoulders slump forward. Slowly straighten one of your legs while you tilt your head back as if you are looking toward the ceiling. Only straighten your leg as far as you can without making your symptoms worse. Hold this position for __________ seconds. Slowly return your leg and head back to the starting position. Repeat with your other leg. Repeat __________ times. Complete this exercise __________ times a day. Knee to chest with hip adduction and internal rotation  Lie on your back on a firm surface with both legs straight. Bend one of your knees and move it up toward your chest until you feel a gentle stretch in your lower back and buttock. Then, move your knee toward the shoulder that is on the opposite side from your leg. This is hip adduction and internal rotation. Hold your leg in this position by holding on to the front of your knee. Hold this position for __________ seconds. Slowly return to the starting position. Repeat with your other leg. Repeat __________ times. Complete this exercise __________ times a day. Prone extension on elbows  Lie on your abdomen on a firm surface. A bed may be too soft for this exercise. Prop yourself up on your elbows. Use your arms to help lift your chest up until you feel a gentle stretch in your abdomen and your lower back. This will place some of your body weight on your elbows.  If this is uncomfortable, try stacking pillows under your chest. Your hips should stay down, against the surface that you are lying on. Keep your hip and back muscles relaxed. Hold this position for __________ seconds. Slowly relax your upper body and return to the starting position. Repeat __________ times. Complete this exercise __________ times a day. Strengthening exercises These exercises build strength and endurance in your back. Endurance is the ability to use your muscles for a long time, even after they get tired. Pelvic tilt This exercise  strengthens the muscles that lie deep in the abdomen. Lie on your back on a firm surface. Bend your knees and keep your feet flat on the surface. Tense your abdominal muscles. Tip your pelvis up toward the ceiling and flatten your lower back into the firm surface. To help with this exercise, you may place a small towel under your lower back and try to push your back into the towel. Hold this position for __________ seconds. Let your muscles relax completely before you repeat this exercise. Repeat __________ times. Complete this exercise __________ times a day. Alternating arm and leg raises  Get on your hands and knees on a firm surface. If you are on a hard floor, you may want to use padding, such as an exercise mat, to cushion your knees. Line up your arms and legs. Your hands should be directly below your shoulders, and your knees should be directly below your hips. Lift your left leg behind you. At the same time, raise your right arm and straighten it in front of you. Do not lift your leg higher than your hip. Do not lift your arm higher than your shoulder. Keep your abdominal and back muscles tight. Keep your hips facing the ground. Do not arch your back. Keep your balance carefully, and do not hold your breath. Hold this position for __________ seconds. Slowly return to the starting position. Repeat with your right leg and your left arm. Repeat __________ times. Complete this exercise __________ times a day. Posture and body mechanics Good posture and healthy body mechanics can help to relieve stress in your body's tissues and joints. Body mechanics refers to the movements and positions of your body while you do your daily activities. Posture is part of body mechanics. Good posture means: Your spine is in its natural S-curve position (neutral). Your shoulders are pulled back slightly. Your head is not tipped forward. Follow these guidelines to improve your posture and body mechanics  in your everyday activities. Standing  When standing, keep your spine neutral and your feet about hip width apart. Keep a slight bend in your knees. Your ears, shoulders, and hips should line up. When you do a task in which you stand in one place for a long time, place one foot up on a stable object that is 2-4 inches (5-10 cm) high, such as a footstool. This helps keep your spine neutral. Sitting  When sitting, keep your spine neutral and keep your feet flat on the floor. Use a footrest, if necessary, and keep your thighs parallel to the floor. Avoid rounding your shoulders, and avoid tilting your head forward. When working at a desk or a computer, keep your desk at a height where your hands are slightly lower than your elbows. Slide your chair under your desk so you are close enough to maintain good posture. When working at a computer, place your monitor at a height where you are looking straight ahead and you do not have  to tilt your head forward or downward to look at the screen. Resting  When lying down and resting, avoid positions that are most painful for you. If you have pain with activities such as sitting, bending, stooping, or squatting, lie in a position in which your body does not bend very much. For example, avoid curling up on your side with your arms and knees near your chest (fetal position). If you have pain with activities such as standing for a long time or reaching with your arms, lie with your spine in a neutral position and bend your knees slightly. Try the following positions: Lying on your side with a pillow between your knees. Lying on your back with a pillow under your knees. Lifting  When lifting objects, keep your feet at least shoulder width apart and tighten your abdominal muscles. Bend your knees and hips and keep your spine neutral. It is important to lift using the strength of your legs, not your back. Do not lock your knees straight out. Always ask for help to  lift heavy or awkward objects. This information is not intended to replace advice given to you by your health care provider. Make sure you discuss any questions you have with your health care provider. Document Revised: 06/04/2021 Document Reviewed: 06/04/2021 Elsevier Patient Education  2023 [employer].

## 2022-06-22 ENCOUNTER — Other Ambulatory Visit: Payer: Self-pay | Admitting: Family Medicine

## 2022-06-22 DIAGNOSIS — M5432 Sciatica, left side: Secondary | ICD-10-CM

## 2022-06-23 ENCOUNTER — Telehealth: Payer: Self-pay

## 2022-06-23 ENCOUNTER — Ambulatory Visit (HOSPITAL_COMMUNITY): Payer: BC Managed Care – PPO | Admitting: Mental Health

## 2022-06-23 ENCOUNTER — Ambulatory Visit (INDEPENDENT_AMBULATORY_CARE_PROVIDER_SITE_OTHER): Payer: BC Managed Care – PPO | Admitting: Family Medicine

## 2022-06-23 VITALS — BP 111/83 | HR 101 | Ht 71.0 in

## 2022-06-23 DIAGNOSIS — Z1231 Encounter for screening mammogram for malignant neoplasm of breast: Secondary | ICD-10-CM | POA: Diagnosis not present

## 2022-06-23 DIAGNOSIS — M5431 Sciatica, right side: Secondary | ICD-10-CM | POA: Diagnosis not present

## 2022-06-23 MED ORDER — PREGABALIN 75 MG PO CAPS: 75.0000 mg | ORAL_CAPSULE | Freq: Two times a day (BID) | ORAL | 1 refills | Status: DC

## 2022-06-23 NOTE — Progress Notes (Signed)
SUBJECTIVE:   CHIEF COMPLAINT / HPI:   [name] is a 52 y.o. female who presents to the [facility] clinic today to discuss the following concerns:   Sciatica F/U Patient has had multiple recent visits for similar concerns.  Was seen on 3/28 in the office and diagnosed with right-sided sciatica.  She then was seen in the ED on 4/6 for left-sided sciatica.  And again on 4/8 by myself for continued left-sided sciatica.  On her first office visit she was prescribed tizanidine and advised to take scheduled Tylenol. She then received Medrol Dosepak and prescription for Percocet in the ED. At my last visit with her on 4/8 I switched her to [facility] and also provided her stretching exercises for rehabilitation.  She had declined formal PT at that time due to financial barriers.  Today she reports ongoing pain down her left leg. Feels like her knee is going to buckle.  She has been doing the stretches about 3 times a day but they hurt to do. Is having to take sitting breaks between activities because it hurts most to stand. Has been walking with a cane (her husbands), having to hold on to walls/doors.  Walking slower.  Reports she finished the prednisone pack, couldn't tell a difference after taking it. Finished muscle relaxer, didn't see a difference with it either. She is not taking the Percocet previously prescribed in ED as that did not help her pain. States it is hard to sleep due to the pain. Overall feeing worse than she did last Monday.  There was no trauma or injury proceeding her symptoms, they happened gradually. Still a shooting pain down posterior left leg.   Right leg pain has completely resolved.  No numbness around private. Has had urinary accidents, about 3x a day, because she cannot make it to the bathroom in time. She does feel sensation of having to urinate. No fecal incontinence.   She has lost about 20 lbs since January which she is proud of.   PERTINENT  PMH / PSH: Anxiety  depression, type 2 diabetes, class III obesity  OBJECTIVE:   BP 111/83   Pulse (!) 101   Ht 5\' 11"  (1.803 m)   LMP 06/23/2022 (Exact Date)   SpO2 99%   BMI 50.04 kg/m    General: Sitting in wheelchair, mild distress, able to participate in exam with some discomfort  Respiratory: normal effort Extremities: no edema MSK: Able to flex and extend b/l knees (though pain with extension of left leg). Normal sensation of thighs and legs b/l. 5/5 strength of bilateral lower extremities. 1+ patellar reflexes bilaterally.  Skin: warm and dry Psych: Normal affect and mood  ASSESSMENT/PLAN:   1. Right sided sciatica History and examination still seem consistent with sciatica. Unfortunately has not had good response to previous therapies. Does have CKD 4 but creatinine clearance is 82 for cockcroft-gault equation so will trial lyrica for her pain. Given worsening of symptoms despite previous therapies, will obtain imaging for further evaluation. Pt requesting cane due to difficulty walking 2/2 pain and feeling unsteady. She would benefit from assistance with ambulation in order to prevent fall.  - pregabalin (LYRICA) 75 MG capsule; Take 1 capsule (75 mg total) by mouth 2 (two) times daily.  Dispense: 60 capsule; Refill: 1 - MR Lumbar Spine Wo Contrast; Future - For home use only DME Cane  2. Breast cancer screening by mammogram Discussed recommendation for mammogram for breast cancer screening.  Patient was given  number to call to schedule. - MM 3D SCREENING MAMMOGRAM BILATERAL BREAST; Future  HM: Also advised to schedule for Pap smear Overdue for colon cancer screening.  Patient is adamant about not wanting colonoscopy.  Discussed other forms to check such as FIT testing, patient will think about this.  Sabino Dick, DO [facility]

## 2022-06-23 NOTE — Patient Instructions (Addendum)
It was wonderful to see you today.  Please bring ALL of your medications with you to every visit.   Today we talked about:  I am ordering an MRI of your lower back.  Will call you when you are able to schedule this. I am sending a prescription called Lyrica which she can take twice a day.  This can help with nerve related pain.  Return in a couple weeks for follow-up.  -You are overdue for a Pap smear. This is a screening test to check for signs of cervical cancer. Please schedule and appointment to return for this at your earliest convenience.    -You are overdue for breast cancer screening, I have ordered a mammogram.  You will need to call to schedule appointment at your earliest convenience. Their number is [phone].   Thank you for coming to your visit as scheduled. We have had a large "no-show" problem lately, and this significantly limits our ability to see and care for patients. As a friendly reminder- if you cannot make your appointment please call to cancel. We do have a no show policy for those who do not cancel within 24 hours. Our policy is that if you miss or fail to cancel an appointment within 24 hours, 3 times in a 79-month period, you may be dismissed from our clinic.   Thank you for choosing [facility].   Please call [phone] with any questions about today's appointment.  Please be sure to schedule follow up at the front  desk before you leave today.   Sabino Dick, DO PGY-3 Family Medicine

## 2022-06-23 NOTE — Telephone Encounter (Signed)
Received message from Dr. Melba Coon regarding DME order for cane. Community message sent to [employer].   Will await response.   Veronda Prude, RN

## 2022-06-24 ENCOUNTER — Telehealth: Payer: Self-pay

## 2022-06-24 ENCOUNTER — Encounter: Payer: Self-pay | Admitting: Family Medicine

## 2022-06-24 NOTE — Telephone Encounter (Signed)
Patient aware.

## 2022-06-24 NOTE — Telephone Encounter (Signed)
Patient calls nurse line requesting a note for work today.   She reports she woke up this morning with increased left sided pain from yesterday. She reports she can barely walk. She reports she has been using a cane with minimal ambulation relief. She reports she was able to pick up Lyrica, however reports no relief.   She denies any new symptoms.   She is requesting a note excusing her from work today.   MRI is scheduled for next week.   Will forward to PCP.

## 2022-06-25 ENCOUNTER — Other Ambulatory Visit: Payer: Self-pay | Admitting: Family Medicine

## 2022-06-25 DIAGNOSIS — M5432 Sciatica, left side: Secondary | ICD-10-CM

## 2022-06-25 NOTE — Telephone Encounter (Signed)
Called patient.  Has noticed a darker yellow color since Monday. Not seeing blood, just seeing dark.  She has been working to try to drink more water. Doesn't feel like she has been drinking enough.  No dysuria or urinary incontinence.   Still having lower back pain and sharp shooting pain in leg. Taking Lyrica.  Amenable to physical therapy. Will place order.

## 2022-06-25 NOTE — Telephone Encounter (Signed)
Patient returns call to nurse line regarding letter for work.   She states that she was not able to view on mychart. Resent this letter via mychart.   Patient also reports concerns with darker colored urine and right sided groin pain. Denies fever, painful urination or increased urination. Recommended follow up appointment due to this concern. She states that she is unable to get off of work to be seen again. She is asking if this could be a side effect of new medication.   Will forward to PCP for further advisement.   Veronda Prude, RN

## 2022-06-27 ENCOUNTER — Telehealth: Payer: Self-pay

## 2022-06-27 ENCOUNTER — Telehealth: Payer: Self-pay | Admitting: Student

## 2022-06-27 NOTE — Telephone Encounter (Signed)
Received after-hours page.  Pt calls d/t sciatica flare. She states last night the pain was so sharp in her L leg that she sat down quickly on the bed and slipped off, didn't fall hard, didn't hit head.   -Seen at [facility] on 3/28 for acute right-sided low back pain with sciatica and prescribed tizanidine and scheduled Tylenol, did not help -Was seen at [facility] on 06/14/2022 and prescribed Percocet and Medrol Dosepak.  Patient states it did not help -Saw Dr. Melba Coon on 06/16/2022 with sciatic pain in left leg and prescribed Flexeril and advised to limit the use of Percocet  -Saw Dr. Melba Coon again on 06/23/2022. She has been taking Tylenol 1000 mg every 4 hours, Lyrica 75 mg twice daily but this hasn't helped.   I explained to patient that I am limited in what I can do for her over the phone.  I cannot prescribe her an NSAID due to her kidney disease, I would not prescribe a steroid burst at this time as it did not seem to help before and she has diabetes. I explained to pt max dose of Tylenol is 1000 mg q6h, she expressed understanding.   Advised patient if she is in severe pain to where she cannot function, she should seek care at an urgent care or the ED for further evaluation and pain management.  She expressed understanding of this and plans to go to the urgent care.

## 2022-06-27 NOTE — Telephone Encounter (Signed)
Patient LVM on nurse line regarding ongoing sciatic pain. She is very tearful on the voicemail.   Attempted to return call to patient. She did not answer, LVM asking that patient return call to office.   Veronda Prude, RN

## 2022-06-28 ENCOUNTER — Other Ambulatory Visit: Payer: Self-pay

## 2022-06-28 ENCOUNTER — Emergency Department (HOSPITAL_COMMUNITY)
Admission: EM | Admit: 2022-06-28 | Discharge: 2022-06-29 | Disposition: A | Discharge status: Home / Self Care | Payer: BC Managed Care – PPO | Attending: Student | Admitting: Student

## 2022-06-28 ENCOUNTER — Encounter (HOSPITAL_COMMUNITY): Payer: Self-pay

## 2022-06-28 DIAGNOSIS — M5432 Sciatica, left side: Secondary | ICD-10-CM

## 2022-06-28 DIAGNOSIS — M5430 Sciatica, unspecified side: Secondary | ICD-10-CM | POA: Diagnosis present

## 2022-06-28 DIAGNOSIS — M5431 Sciatica, right side: Secondary | ICD-10-CM | POA: Diagnosis not present

## 2022-06-28 MED ORDER — HYDROMORPHONE HCL 1 MG/ML IJ SOLN
2.0000 mg | Freq: Once | INTRAMUSCULAR | Status: AC
  Administered 2022-06-28: 2 mg via INTRAMUSCULAR
  Filled 2022-06-28: qty 2

## 2022-06-28 MED ORDER — LIDOCAINE 5 % EX PTCH
1.0000 | MEDICATED_PATCH | CUTANEOUS | Status: DC
  Administered 2022-06-28: 1 via TRANSDERMAL
  Filled 2022-06-28: qty 1

## 2022-06-28 NOTE — ED Triage Notes (Signed)
Pt came in via POV d/t sciatica pain since the week before easter. A/Ox4 & rates her pain 10/10. Mostly feels it in mid back & bil legs, both knees are very painful (per pt).

## 2022-06-28 NOTE — ED Provider Notes (Signed)
[facility]ENC[facility] Provider Note MRN:  [id number]  Arrival date & time: 06/29/22     Chief Complaint   Back pain  History of Present Illness   [name] is a 52 y.o. year-old female presents to the ED with chief complaint of sciatica.  Has been having unsuccessful attempts at treatment with steroids and muscle relaxers.  She is taking max dose Tylenol.  Hx of DM and renal insufficiency so further NSAIDs and steroids have been withheld.  Was referred to the ER for further pain control by her PCP.    Patient states that she has an MRI scheduled for later this month.  She denies fevers, chills, or incontinence.  She states that the pain makes it hard for her to walk.  She states that it feels like she is going to fall and she has been using a cane.  \  The pain radiates down both legs.  History provided by patient.   Review of Systems  Pertinent positive and negative review of systems noted in HPI.    Physical Exam   Vitals:   06/28/22 1811 06/28/22 1941  BP: 123/77 123/63  Pulse: (!) 44 (!) 109  Resp: 20 18  Temp: 98.7 F (37.1 C) 98.9 F (37.2 C)  SpO2: 97% 97%    CONSTITUTIONAL:  non toxic-appearing, NAD, tearful NEURO:  Alert and oriented x 3, CN 3-12 grossly intact, sensation intact EYES:  eyes equal and reactive ENT/NECK:  Supple, no stridor  CARDIO:  normal rate, regular rhythm, appears well-perfused  PULM:  No respiratory distress,  GI/GU:  non-distended,  MSK/SPINE:  No gross deformities, no edema, moves all extremities, normal great toe extension SKIN:  no rash, atraumatic   *Additional and/or pertinent findings included in MDM below  Diagnostic and Interventional Summary    EKG Interpretation  Date/Time:    Ventricular Rate:    PR Interval:    QRS Duration:   QT Interval:    QTC Calculation:   R Axis:     Text Interpretation:         Labs Reviewed - No data to display  No orders to display     Medications  lidocaine (LIDODERM) 5 % 1 patch (1 patch Transdermal Patch Applied 06/28/22 2345)  HYDROmorphone (DILAUDID) injection 2 mg (2 mg Intramuscular Given 06/28/22 2343)     Procedures  /  Critical Care Procedures  ED Course and Medical Decision Making  I have reviewed the triage vital signs, the nursing notes, and pertinent available records from the EMR.  Social Determinants Affecting Complexity of Care: Patient has no clinically significant social determinants affecting this chief complaint..   ED Course:    Medical Decision Making Patient feeling better after treatment in ED.  Will refill flexeril.  Will give RX for lidoderm.  She has MRI scheduled for later in the month.  Will refer to ortho.  Will place home health/SW/CM to see if she can get a different cane.  Might benefit from home PT.  Risk Prescription drug management.     Consultants: No consultations were needed in caring for this patient.   Treatment and Plan: Emergency department workup does not suggest an emergent condition requiring admission or immediate intervention beyond  what has been performed at this time. The patient is safe for discharge and has  been instructed to return immediately for worsening symptoms, change in  symptoms or any other concerns    Final Clinical Impressions(s) /  ED Diagnoses     ICD-10-CM   1. Bilateral sciatica  M54.31    M54.32       ED Discharge Orders          Ordered    cyclobenzaprine (FLEXERIL) 10 MG tablet  3 times daily PRN        06/29/22 0046    lidocaine (LIDODERM) 5 %  Every 24 hours        06/29/22 0046              Discharge Instructions Discussed with and Provided to Patient:   Discharge Instructions   None      Roxy Horseman, PA-C 06/29/22 0116    Kommor, Wyn Forster, MD 06/29/22 [phone]

## 2022-06-29 MED ORDER — CYCLOBENZAPRINE HCL 10 MG PO TABS: 10.0000 mg | ORAL_TABLET | Freq: Three times a day (TID) | ORAL | 0 refills | Status: DC | PRN

## 2022-06-29 MED ORDER — LIDOCAINE 5 % EX PTCH: 1.0000 | MEDICATED_PATCH | CUTANEOUS | 0 refills | Status: DC

## 2022-06-30 ENCOUNTER — Ambulatory Visit: Payer: BC Managed Care – PPO | Admitting: Family Medicine

## 2022-06-30 NOTE — Progress Notes (Deleted)
    SUBJECTIVE:   CHIEF COMPLAINT / HPI:   Sciatica: Seen in ED on 4/20, received Dilaudid and Lidocaine patch with relief. Unsuccessfully treated with steroids and muscle relaxers. On max dose Tylenol. Referred to ortho. Home PT?  PERTINENT  PMH / PSH: ***  OBJECTIVE:   LMP 06/23/2022 (Exact Date)  ***  General: NAD, pleasant, able to participate in exam Cardiac: RRR, no murmurs. Respiratory: CTAB, normal effort, No wheezes, rales or rhonchi Abdomen: Bowel sounds present, nontender, nondistended Extremities: no edema or cyanosis. Skin: warm and dry, no rashes noted Neuro: alert, no obvious focal deficits Psych: Normal affect and mood  ASSESSMENT/PLAN:   No problem-specific Assessment & Plan notes found for this encounter.     Dr. Elberta Fortis, DO [place] [facility]    {    This will disappear when note is signed, click to select method of visit    :1}

## 2022-07-01 ENCOUNTER — Ambulatory Visit (HOSPITAL_COMMUNITY)
Admission: RE | Admit: 2022-07-01 | Discharge: 2022-07-01 | Disposition: A | Discharge status: Home / Self Care | Payer: BC Managed Care – PPO | Source: Ambulatory Visit | Attending: Family Medicine | Admitting: Family Medicine

## 2022-07-01 DIAGNOSIS — M5431 Sciatica, right side: Secondary | ICD-10-CM | POA: Diagnosis not present

## 2022-07-02 ENCOUNTER — Telehealth (HOSPITAL_COMMUNITY): Payer: Self-pay | Admitting: *Deleted

## 2022-07-02 NOTE — Telephone Encounter (Signed)
Patient called and stated that she is having tardive dyskinesia since increase in her Remeron. Having eye movements, dropping things, hands shake. States she has had it before and knows its the same thing. Message sent to MD.

## 2022-07-03 ENCOUNTER — Other Ambulatory Visit (HOSPITAL_COMMUNITY): Payer: Self-pay | Admitting: Psychiatry

## 2022-07-03 ENCOUNTER — Telehealth: Payer: Self-pay

## 2022-07-03 DIAGNOSIS — F32A Depression, unspecified: Secondary | ICD-10-CM

## 2022-07-03 DIAGNOSIS — F411 Generalized anxiety disorder: Secondary | ICD-10-CM

## 2022-07-03 MED ORDER — MIRTAZAPINE 15 MG PO TABS
30.0000 mg | ORAL_TABLET | Freq: Every day | ORAL | 3 refills | Status: DC
Start: 2022-07-03 — End: 2022-08-06

## 2022-07-03 NOTE — Telephone Encounter (Signed)
Patient calls nurse line regarding continued issues with back and leg pain. She reports that she woke up today and is having such difficulty walking on her left leg that she had to call out of work. She also reports that she has been having twitching in her eyes and hands. She was told by psychiatrist that this could be related to too much serotonin.   Offered to schedule appointment today, as it would be difficult to evaluate this concern over the phone. She declines at this time, stating that she does not know how she would get out of her house for an appointment.   She is also concerned about not hearing the results of her MRI yet.  Explained that the results are still pending and that we would let her know once we have these results.   She is requesting returned call from PCP to discuss further.   Veronda Prude, RN

## 2022-07-03 NOTE — Telephone Encounter (Signed)
Patient informed writer that she has been diagnoses with sciatica. She notes that she has back and leg pain. Patient also notes that she has bodily tremors, muscle rigidity, and abnormal eye movements. Provider informed patient that she maybe experiencing serotonin syndrome and recommenced reducing or discontinuing one of her antidepressants. She notes that she prefers to reduce her mirtazapine. Provider reduced her mirtazapine to 30 mg. Patient given two 15 mg tablets to be taken nightly. Provider informed patient to reduce mirtazapine to 15 mg nightly if symptoms do not improve. She endorsed understanding and agreed. Patient notes that mentally she feels well. She reports that her sciatic pain is all she can focus on. She notes that she is followed by a PCP for this condition. No other concerns noted at this time.

## 2022-07-03 NOTE — Telephone Encounter (Signed)
Called patient. She reports that the pain is worse today.  She started to have twitching in her hands which she states she forgot to mention at previous visits but this seems to have worsened.  MRI lumbar spine performed today but results not yet read.  Patient reports unbearable pain.   Discussed limitations of being able to evaluate patient over the phone.  She has had several visits now for similar symptoms and does not seem to be responding to conservative treatments.  Recommend ED evaluation given patient concern for worsening pain and now twitching of hands. She may benefit from further MRI testing and lab work to evaluate for other causes: electrolyte abnormalities, hypoglycemia, serotonin syndrome, etc.   Discussed recommendation for ED eval to patient.

## 2022-07-04 ENCOUNTER — Other Ambulatory Visit: Payer: Self-pay | Admitting: Family Medicine

## 2022-07-04 ENCOUNTER — Ambulatory Visit (INDEPENDENT_AMBULATORY_CARE_PROVIDER_SITE_OTHER): Payer: BC Managed Care – PPO | Admitting: Family Medicine

## 2022-07-04 VITALS — BP 136/77 | Ht 71.0 in | Wt 351.5 lb

## 2022-07-04 DIAGNOSIS — M5432 Sciatica, left side: Secondary | ICD-10-CM

## 2022-07-04 DIAGNOSIS — E118 Type 2 diabetes mellitus with unspecified complications: Secondary | ICD-10-CM

## 2022-07-04 MED ORDER — OXYCODONE-ACETAMINOPHEN 5-325 MG PO TABS
1.0000 | ORAL_TABLET | Freq: Four times a day (QID) | ORAL | 0 refills | Status: DC | PRN
Start: 2022-07-04 — End: 2022-07-12

## 2022-07-04 NOTE — Progress Notes (Signed)
SUBJECTIVE:   CHIEF COMPLAINT / HPI:    Patient has been seen multiple times in the clinic in the ED for back pain with sciatica.  [name] recently had an MRI lumbar spine performed on 4/23, final results pending at this time.  See chart for further details.  Patient reports that [name] is having significant lower back pain primarily on the left side radiating to the left leg. [name] is concerned about something wrong with her muscles.  [name] does sometimes feel cramping in the muscles of her leg. [name] feels numbness and tingling going down the left leg. [name] has difficulty with ambulation and has missed multiple days of work due to pain. [name] reports that [name] has had ongoing urinary incontinence.  Also having right-sided groin numbness. [name] reports pregabalin has not been helping that much. [name] reports that [name] was having some twitching in the right arm but this has since resolved.  [name] reports that [name] has had intermittent weakness in the right hand, sometimes dropping things which has been ongoing for weeks but worse in the past week. Patient requesting new cane.  [name] does not want a walker.  Patient was tearful during encounter, worried about not being able to work so that [name] can pay her health insurance.  PERTINENT  PMH / PSH: CKD stage III Patient reports history of rhabdomyolysis  Patient Care Team: Sabino Dick, DO as PCP - General (Family Medicine) Maisie Fus, MD as PCP - Cardiology (Cardiology)   OBJECTIVE:   BP 136/77   Ht 5\' 11"  (1.803 m)   Wt (!) 351 lb 8 oz (159.4 kg)   LMP 06/23/2022 (Exact Date)   BMI 49.02 kg/m   Physical Exam Constitutional:      General: [name] is not in acute distress.    Appearance: [name] is obese.  Cardiovascular:     Rate and Rhythm: Normal rate and regular rhythm.     Heart sounds: Normal heart sounds.  Pulmonary:     Effort: Pulmonary effort is normal. No respiratory distress.     Breath sounds: Normal breath sounds.  Musculoskeletal:      Cervical back: Neck supple.     Comments: Tenderness to palpation lumbar midline spine and thoracic and lumbar paraspinal muscles bilaterally.  Skin:    General: Skin is warm and dry.  Neurological:     Mental Status: [name] is alert.     Comments: 4/5 strength with dorsiflexion on the left, otherwise 5/5 strength in bilateral lower extremities.  5/5 strength bilateral upper extremities.  2+ biceps and brachioradialis reflexes bilaterally.  2+ Achilles reflex bilaterally.  Unable to elicit patellar reflexes bilaterally.          {Show previous vital signs (optional):23777}    ASSESSMENT/PLAN:   1. Left sided sciatica Severe back pain with left-sided sciatic symptoms ongoing for several weeks refractory to multiple medications, unfortunately treatment options are limited by underlying CKD.  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  Will give short course of narcotic, referred to neurosurgery for further evaluation and management.  Could consider imaging cervical spine given reported right hand weakness, will defer for now.  Could also consider lab work to evaluate for muscular causes of leg pain which could include CK, potassium, magnesium. - Ambulatory referral to Neurosurgery - For home use only DME Cane - oxyCODONE-acetaminophen (PERCOCET/ROXICET) 5-325 MG tablet;  Take 1 tablet by mouth every 6 (six) hours as needed for severe pain.  Dispense: 20 tablet; Refill: 0    Written out of work until PCP appointment on 4/30  Return in 4 days (on 07/08/2022) for Follow-up back pain with PCP as scheduled.   Littie Deeds, MD [facility]

## 2022-07-04 NOTE — Patient Instructions (Addendum)
It was nice seeing you today!  I work on getting you a new cane.  I referred you to a neurosurgeon.  Take Percocet as needed for severe pain.  Stay well, Samantha Deeds, MD [facility] [phone]  --  Make sure to check out at the front desk before you leave today.  Please arrive at least 15 minutes prior to your scheduled appointments.  If you had blood work today, I will send you a MyChart message or a letter if results are normal. Otherwise, I will give you a call.  If you had a referral placed, they will call you to set up an appointment. Please give [name] a call if you don't hear back in the next 2 weeks.  If you need additional refills before your next appointment, please call your pharmacy first.

## 2022-07-04 NOTE — Telephone Encounter (Signed)
Patient returns call to nurse line. She states that she had to miss work again today and does not feel that she will be able to work this weekend.   She reports continued pain and difficulty with walking due to pain. She did not go to the ED yesterday as recommended by Dr. Melba Coon. She states that the "ED will not do anything for me."  Highly recommended that patient be evaluated today due to continuance of symptoms and issues with mobility. She scheduled for this afternoon in [facility] with Dr. Wynelle Link.   Veronda Prude, RN

## 2022-07-07 ENCOUNTER — Other Ambulatory Visit (HOSPITAL_COMMUNITY): Payer: Self-pay

## 2022-07-07 ENCOUNTER — Telehealth: Payer: Self-pay

## 2022-07-07 NOTE — Telephone Encounter (Signed)
A Prior Authorization was initiated for this patients OXYCODONE/ACETAMINOPHEN through CoverMyMeds.   Key: [employer]

## 2022-07-07 NOTE — Telephone Encounter (Signed)
Community message sent to [employer] requesting update.   Veronda Prude, RN

## 2022-07-08 ENCOUNTER — Ambulatory Visit: Payer: Self-pay | Admitting: Family Medicine

## 2022-07-08 ENCOUNTER — Telehealth: Payer: Self-pay

## 2022-07-08 ENCOUNTER — Encounter (HOSPITAL_COMMUNITY): Payer: Self-pay

## 2022-07-08 ENCOUNTER — Other Ambulatory Visit (HOSPITAL_COMMUNITY): Payer: Self-pay

## 2022-07-08 ENCOUNTER — Inpatient Hospital Stay (HOSPITAL_COMMUNITY)
Admission: EM | Admit: 2022-07-08 | Discharge: 2022-07-09 | DRG: 552 | Discharge status: Against Medical Advice | Payer: BC Managed Care – PPO | Attending: Family Medicine | Admitting: Family Medicine

## 2022-07-08 ENCOUNTER — Other Ambulatory Visit: Payer: Self-pay

## 2022-07-08 DIAGNOSIS — I130 Hypertensive heart and chronic kidney disease with heart failure and stage 1 through stage 4 chronic kidney disease, or unspecified chronic kidney disease: Secondary | ICD-10-CM | POA: Diagnosis present

## 2022-07-08 DIAGNOSIS — M129 Arthropathy, unspecified: Secondary | ICD-10-CM | POA: Diagnosis present

## 2022-07-08 DIAGNOSIS — E039 Hypothyroidism, unspecified: Secondary | ICD-10-CM | POA: Diagnosis present

## 2022-07-08 DIAGNOSIS — N1832 Chronic kidney disease, stage 3b: Secondary | ICD-10-CM | POA: Diagnosis present

## 2022-07-08 DIAGNOSIS — M2548 Effusion, other site: Secondary | ICD-10-CM | POA: Diagnosis present

## 2022-07-08 DIAGNOSIS — M5442 Lumbago with sciatica, left side: Secondary | ICD-10-CM | POA: Diagnosis not present

## 2022-07-08 DIAGNOSIS — M5430 Sciatica, unspecified side: Secondary | ICD-10-CM

## 2022-07-08 DIAGNOSIS — G8929 Other chronic pain: Secondary | ICD-10-CM

## 2022-07-08 DIAGNOSIS — Z91040 Latex allergy status: Secondary | ICD-10-CM

## 2022-07-08 DIAGNOSIS — Z79899 Other long term (current) drug therapy: Secondary | ICD-10-CM

## 2022-07-08 DIAGNOSIS — G2401 Drug induced subacute dyskinesia: Secondary | ICD-10-CM | POA: Diagnosis present

## 2022-07-08 DIAGNOSIS — E119 Type 2 diabetes mellitus without complications: Secondary | ICD-10-CM

## 2022-07-08 DIAGNOSIS — I10 Essential (primary) hypertension: Secondary | ICD-10-CM | POA: Diagnosis present

## 2022-07-08 DIAGNOSIS — Z79890 Hormone replacement therapy (postmenopausal): Secondary | ICD-10-CM

## 2022-07-08 DIAGNOSIS — M545 Low back pain, unspecified: Secondary | ICD-10-CM | POA: Diagnosis not present

## 2022-07-08 DIAGNOSIS — F332 Major depressive disorder, recurrent severe without psychotic features: Secondary | ICD-10-CM | POA: Diagnosis present

## 2022-07-08 DIAGNOSIS — N189 Chronic kidney disease, unspecified: Secondary | ICD-10-CM | POA: Diagnosis present

## 2022-07-08 DIAGNOSIS — E1142 Type 2 diabetes mellitus with diabetic polyneuropathy: Secondary | ICD-10-CM | POA: Diagnosis present

## 2022-07-08 DIAGNOSIS — I509 Heart failure, unspecified: Secondary | ICD-10-CM

## 2022-07-08 DIAGNOSIS — Z66 Do not resuscitate: Secondary | ICD-10-CM | POA: Diagnosis present

## 2022-07-08 DIAGNOSIS — M5386 Other specified dorsopathies, lumbar region: Secondary | ICD-10-CM | POA: Diagnosis present

## 2022-07-08 DIAGNOSIS — N184 Chronic kidney disease, stage 4 (severe): Secondary | ICD-10-CM | POA: Diagnosis present

## 2022-07-08 DIAGNOSIS — E662 Morbid (severe) obesity with alveolar hypoventilation: Secondary | ICD-10-CM | POA: Diagnosis present

## 2022-07-08 DIAGNOSIS — E785 Hyperlipidemia, unspecified: Secondary | ICD-10-CM | POA: Diagnosis present

## 2022-07-08 DIAGNOSIS — Z888 Allergy status to other drugs, medicaments and biological substances status: Secondary | ICD-10-CM

## 2022-07-08 DIAGNOSIS — F329 Major depressive disorder, single episode, unspecified: Secondary | ICD-10-CM | POA: Diagnosis present

## 2022-07-08 DIAGNOSIS — Z886 Allergy status to analgesic agent status: Secondary | ICD-10-CM

## 2022-07-08 DIAGNOSIS — I5022 Chronic systolic (congestive) heart failure: Secondary | ICD-10-CM

## 2022-07-08 DIAGNOSIS — E1122 Type 2 diabetes mellitus with diabetic chronic kidney disease: Secondary | ICD-10-CM | POA: Diagnosis present

## 2022-07-08 DIAGNOSIS — J45909 Unspecified asthma, uncomplicated: Secondary | ICD-10-CM | POA: Diagnosis present

## 2022-07-08 DIAGNOSIS — Z794 Long term (current) use of insulin: Secondary | ICD-10-CM

## 2022-07-08 DIAGNOSIS — Z5329 Procedure and treatment not carried out because of patient's decision for other reasons: Secondary | ICD-10-CM | POA: Diagnosis present

## 2022-07-08 DIAGNOSIS — I5042 Chronic combined systolic (congestive) and diastolic (congestive) heart failure: Secondary | ICD-10-CM | POA: Diagnosis present

## 2022-07-08 LAB — CBC WITH DIFFERENTIAL/PLATELET
Abs Immature Granulocytes: 0.02 10*3/uL (ref 0.00–0.07)
Basophils Absolute: 0 10*3/uL (ref 0.0–0.1)
Basophils Relative: 0 %
Eosinophils Absolute: 0.2 10*3/uL (ref 0.0–0.5)
Eosinophils Relative: 2 %
HCT: 35.1 % — ABNORMAL LOW (ref 36.0–46.0)
Hemoglobin: 10.8 g/dL — ABNORMAL LOW (ref 12.0–15.0)
Immature Granulocytes: 0 %
Lymphocytes Relative: 38 %
Lymphs Abs: 2.7 10*3/uL (ref 0.7–4.0)
MCH: 26.8 pg (ref 26.0–34.0)
MCHC: 30.8 g/dL (ref 30.0–36.0)
MCV: 87.1 fL (ref 80.0–100.0)
Monocytes Absolute: 0.5 10*3/uL (ref 0.1–1.0)
Monocytes Relative: 7 %
Neutro Abs: 3.7 10*3/uL (ref 1.7–7.7)
Neutrophils Relative %: 53 %
Platelets: 351 10*3/uL (ref 150–400)
RBC: 4.03 MIL/uL (ref 3.87–5.11)
RDW: 13.4 % (ref 11.5–15.5)
WBC: 7.1 10*3/uL (ref 4.0–10.5)
nRBC: 0 % (ref 0.0–0.2)

## 2022-07-08 LAB — CBG MONITORING, ED: Glucose-Capillary: 119 mg/dL — ABNORMAL HIGH (ref 70–99)

## 2022-07-08 LAB — BASIC METABOLIC PANEL
Anion gap: 10 (ref 5–15)
BUN: 14 mg/dL (ref 6–20)
CO2: 17 mmol/L — ABNORMAL LOW (ref 22–32)
Calcium: 7.7 mg/dL — ABNORMAL LOW (ref 8.9–10.3)
Chloride: 113 mmol/L — ABNORMAL HIGH (ref 98–111)
Creatinine, Ser: 1.35 mg/dL — ABNORMAL HIGH (ref 0.44–1.00)
GFR, Estimated: 47 mL/min — ABNORMAL LOW (ref >60)
Glucose, Bld: 106 mg/dL — ABNORMAL HIGH (ref 70–99)
Potassium: 4.1 mmol/L (ref 3.5–5.1)
Sodium: 140 mmol/L (ref 135–145)

## 2022-07-08 LAB — GLUCOSE, CAPILLARY
Glucose-Capillary: 122 mg/dL — ABNORMAL HIGH (ref 70–99)
Glucose-Capillary: 208 mg/dL — ABNORMAL HIGH (ref 70–99)

## 2022-07-08 MED ORDER — HYDROMORPHONE HCL 1 MG/ML IJ SOLN: 1.0000 mg | INTRAMUSCULAR | Status: DC | PRN

## 2022-07-08 MED ORDER — FAMOTIDINE 20 MG PO TABS
20.0000 mg | ORAL_TABLET | Freq: Two times a day (BID) | ORAL | Status: DC
  Administered 2022-07-08 – 2022-07-09 (×2): 20 mg via ORAL
  Filled 2022-07-08 (×4): qty 1

## 2022-07-08 MED ORDER — FLUOXETINE HCL 40 MG PO CAPS: 80.0000 mg | ORAL_CAPSULE | Freq: Every day | ORAL | Status: DC

## 2022-07-08 MED ORDER — CYCLOBENZAPRINE HCL 10 MG PO TABS
5.0000 mg | ORAL_TABLET | Freq: Once | ORAL | Status: AC
  Administered 2022-07-08: 5 mg via ORAL
  Filled 2022-07-08: qty 1

## 2022-07-08 MED ORDER — OXYCODONE-ACETAMINOPHEN 5-325 MG PO TABS: 1.0000 | ORAL_TABLET | Freq: Four times a day (QID) | ORAL | Status: DC | PRN

## 2022-07-08 MED ORDER — ROSUVASTATIN CALCIUM 20 MG PO TABS
20.0000 mg | ORAL_TABLET | Freq: Every day | ORAL | Status: DC
  Administered 2022-07-08 – 2022-07-09 (×2): 20 mg via ORAL
  Filled 2022-07-08 (×2): qty 1

## 2022-07-08 MED ORDER — GABAPENTIN 100 MG PO CAPS
100.0000 mg | ORAL_CAPSULE | Freq: Three times a day (TID) | ORAL | Status: AC
  Administered 2022-07-08 – 2022-07-09 (×4): 100 mg via ORAL
  Filled 2022-07-08 (×4): qty 1

## 2022-07-08 MED ORDER — HYDROXYZINE HCL 25 MG PO TABS
50.0000 mg | ORAL_TABLET | Freq: Three times a day (TID) | ORAL | Status: DC | PRN
  Administered 2022-07-08: 50 mg via ORAL
  Filled 2022-07-08: qty 2

## 2022-07-08 MED ORDER — HYDROMORPHONE HCL 1 MG/ML IJ SOLN
1.0000 mg | INTRAMUSCULAR | Status: DC | PRN
  Administered 2022-07-08 – 2022-07-09 (×2): 1 mg via INTRAVENOUS
  Filled 2022-07-08 (×4): qty 1

## 2022-07-08 MED ORDER — INSULIN ASPART 100 UNIT/ML IJ SOLN
0.0000 [IU] | Freq: Three times a day (TID) | INTRAMUSCULAR | Status: DC
  Administered 2022-07-09: 11 [IU] via SUBCUTANEOUS
  Administered 2022-07-09: 15 [IU] via SUBCUTANEOUS

## 2022-07-08 MED ORDER — PREGABALIN 25 MG PO CAPS: 150.0000 mg | ORAL_CAPSULE | Freq: Two times a day (BID) | ORAL | Status: DC

## 2022-07-08 MED ORDER — MIRTAZAPINE 15 MG PO TABS: 30.0000 mg | ORAL_TABLET | Freq: Every day | ORAL | Status: DC

## 2022-07-08 MED ORDER — HYDROMORPHONE HCL 1 MG/ML IJ SOLN
1.0000 mg | Freq: Once | INTRAMUSCULAR | Status: AC
  Administered 2022-07-08: 1 mg via INTRAVENOUS
  Filled 2022-07-08: qty 1

## 2022-07-08 MED ORDER — ENOXAPARIN SODIUM 80 MG/0.8ML IJ SOSY
80.0000 mg | PREFILLED_SYRINGE | INTRAMUSCULAR | Status: DC
  Administered 2022-07-08 – 2022-07-09 (×2): 80 mg via SUBCUTANEOUS
  Filled 2022-07-08 (×3): qty 0.8

## 2022-07-08 MED ORDER — LORAZEPAM 1 MG PO TABS
1.0000 mg | ORAL_TABLET | Freq: Two times a day (BID) | ORAL | Status: DC | PRN
  Administered 2022-07-08 – 2022-07-09 (×2): 1 mg via ORAL
  Filled 2022-07-08 (×2): qty 1

## 2022-07-08 MED ORDER — METHYLPREDNISOLONE SODIUM SUCC 125 MG IJ SOLR
60.0000 mg | Freq: Four times a day (QID) | INTRAMUSCULAR | Status: DC
  Administered 2022-07-08 – 2022-07-09 (×5): 60 mg via INTRAVENOUS
  Filled 2022-07-08 (×5): qty 2

## 2022-07-08 MED ORDER — LEVOTHYROXINE SODIUM 75 MCG PO TABS
300.0000 ug | ORAL_TABLET | Freq: Every day | ORAL | Status: DC
  Administered 2022-07-09: 300 ug via ORAL
  Filled 2022-07-08: qty 4

## 2022-07-08 MED ORDER — ENOXAPARIN SODIUM 40 MG/0.4ML IJ SOSY: 40.0000 mg | PREFILLED_SYRINGE | INTRAMUSCULAR | Status: DC

## 2022-07-08 MED ORDER — ISOSORB DINITRATE-HYDRALAZINE 20-37.5 MG PO TABS
1.0000 | ORAL_TABLET | Freq: Three times a day (TID) | ORAL | Status: DC
  Administered 2022-07-08 – 2022-07-09 (×3): 1 via ORAL
  Filled 2022-07-08 (×4): qty 1

## 2022-07-08 MED ORDER — OXYCODONE-ACETAMINOPHEN 5-325 MG PO TABS
1.0000 | ORAL_TABLET | Freq: Four times a day (QID) | ORAL | Status: DC
  Administered 2022-07-08 – 2022-07-09 (×5): 1 via ORAL
  Filled 2022-07-08 (×5): qty 1

## 2022-07-08 MED ORDER — METHYLPREDNISOLONE SODIUM SUCC 125 MG IJ SOLR: 125.0000 mg | Freq: Once | INTRAMUSCULAR | Status: DC

## 2022-07-08 MED ORDER — LIDOCAINE 5 % EX PTCH
1.0000 | MEDICATED_PATCH | CUTANEOUS | Status: DC
  Administered 2022-07-08 – 2022-07-09 (×2): 1 via TRANSDERMAL
  Filled 2022-07-08 (×2): qty 1

## 2022-07-08 MED ORDER — METOPROLOL TARTRATE 25 MG PO TABS
25.0000 mg | ORAL_TABLET | Freq: Two times a day (BID) | ORAL | Status: DC
  Administered 2022-07-08 – 2022-07-09 (×2): 25 mg via ORAL
  Filled 2022-07-08 (×3): qty 1

## 2022-07-08 MED ORDER — MOMETASONE FURO-FORMOTEROL FUM 200-5 MCG/ACT IN AERO
2.0000 | INHALATION_SPRAY | Freq: Two times a day (BID) | RESPIRATORY_TRACT | Status: DC
  Administered 2022-07-08 – 2022-07-09 (×3): 2 via RESPIRATORY_TRACT
  Filled 2022-07-08: qty 8.8

## 2022-07-08 NOTE — ED Provider Notes (Signed)
[place] EMERGENCY DEPARTMENT AT [facility] Provider Note   CSN: [id number] Arrival date & time: 07/08/22  1130     History  Chief Complaint  Patient presents with   Back Pain    [name] is a 52 y.o. female.  Has an extensive history including but not limited to hypertension, diabetes, anxiety, depression, asthma who presents to the ED for evaluation of left-sided low back pain.  Symptoms bega the week after Easter and have progressively gotten worse.  Initially started on the right side but has moved to the left side.  Describes the pain as a shooting sensation and radiates down the posterior of the left leg.  States she has some left foot numbness and tingling as well.  She has been seen multiple times both in the ED and in the office for this.  Has been started on steroid packs and muscle relaxers with no relief.  Has been using Tylenol with no relief as well.  Was sent prescriptions for opioid pain medications but has not been able to pick these up due to insurance.  She had an MRI of her low back done on 07/01/2022 with multilevel disc bulging and mild bilateral foraminal stenosis.  She has been using a cane at home and states she has difficulty walking due to the pain.  This has caused her to miss multiple days of work.  She states she has to walk slow and sometimes has difficulty making it to the bathroom but denies urinary or fecal incontinence.  She denies fevers or history of injection drug use.  She has bilateral groin pain but no numbness.  An ambulatory referral to neurosurgery was placed at her last appointment.  She denies other symptoms.    Back Pain      Home Medications Prior to Admission medications   Medication Sig Start Date End Date Taking? Authorizing Provider  albuterol (VENTOLIN HFA) 108 (90 Base) MCG/ACT inhaler Inhale 2 puffs into the lungs every 6 (six) hours as needed for shortness of breath. 02/26/22   Clapacs, Jackquline Denmark, MD  blood glucose  meter kit and supplies KIT Dispense based on patient and insurance preference. Use up to four times daily as directed. 10/01/21   Sabino Dick, DO  cyclobenzaprine (FLEXERIL) 10 MG tablet Take 1 tablet (10 mg total) by mouth 3 (three) times daily as needed for muscle spasms. 06/29/22   Roxy Horseman, PA-C  dicyclomine (BENTYL) 20 MG tablet Take 1 tablet (20 mg total) by mouth 3 (three) times daily as needed for spasms (abdominal pain). Patient not taking: Reported on 04/22/2022 03/12/22   Sabino Dick, DO  famotidine (PEPCID) 20 MG tablet Take 1 tablet (20 mg total) by mouth 2 (two) times daily. 02/26/22   Clapacs, Jackquline Denmark, MD  FLUoxetine (PROZAC) 40 MG capsule Take 2 capsules (80 mg total) by mouth daily. 05/21/22   Shanna Cisco, NP  fluticasone (FLONASE) 50 MCG/ACT nasal spray Place 1 spray into both nostrils daily. 1 spray in each nostril every day 06/10/21   Sabino Dick, DO  fluticasone-salmeterol (WIXELA INHUB) 250-50 MCG/ACT AEPB Inhale 1 puff into the lungs in the morning and at bedtime. 04/22/22   Sabino Dick, DO  furosemide (LASIX) 40 MG tablet Take 1 tablet (40 mg total) by mouth daily. 02/26/22   Clapacs, Jackquline Denmark, MD  hydrOXYzine (ATARAX) 50 MG tablet Take 1 tablet (50 mg total) by mouth 3 (three) times daily as needed for anxiety. 05/21/22  Toy Cookey E, NP  insulin glargine (LANTUS) 100 UNIT/ML Solostar Pen Inject 65 Units into the skin 2 (two) times daily. 04/22/22   Sabino Dick, DO  Insulin Pen Needle 32G X 4 MM MISC 1 each by Does not apply route 3 (three) times daily. 04/23/22   Sabino Dick, DO  isosorbide-hydrALAZINE (BIDIL) 20-37.5 MG tablet Take 1 tablet by mouth 3 (three) times daily. 02/26/22   Clapacs, Jackquline Denmark, MD  levothyroxine (SYNTHROID) 300 MCG tablet Take 1 tablet (300 mcg total) by mouth daily at 6 (six) AM. 04/22/22   Espinoza, Alejandra, DO  lidocaine (LIDODERM) 5 % Place 1 patch onto the skin daily. Remove & Discard patch  within 12 hours or as directed by MD 06/29/22   Roxy Horseman, PA-C  LORazepam (ATIVAN) 1 MG tablet Take 1 tablet by mouth twice daily as needed for anxiety 06/13/22   Sabino Dick, DO  metoprolol tartrate (LOPRESSOR) 25 MG tablet Take 1 tablet (25 mg total) by mouth 2 (two) times daily. 02/26/22   Clapacs, Jackquline Denmark, MD  mirtazapine (REMERON) 15 MG tablet Take 2 tablets (30 mg total) by mouth at bedtime. 07/03/22   Shanna Cisco, NP  NOVOLOG FLEXPEN 100 UNIT/ML FlexPen INJECT 15 UNITS SUBCUTANEOUSLY WITH LUNCH AND INJECT 20 UNITS SUBCUTANEOUSLY WITH SUPPER 07/07/22   Sabino Dick, DO  oxyCODONE-acetaminophen (PERCOCET/ROXICET) 5-325 MG tablet Take 1 tablet by mouth every 6 (six) hours as needed for severe pain. 07/04/22   Littie Deeds, MD  pregabalin (LYRICA) 75 MG capsule Take 1 capsule (75 mg total) by mouth 2 (two) times daily. 06/23/22   Sabino Dick, DO  rosuvastatin (CRESTOR) 20 MG tablet Take 1 tablet (20 mg total) by mouth daily. 02/26/22   Clapacs, Jackquline Denmark, MD  Semaglutide, 2 MG/DOSE, (OZEMPIC, 2 MG/DOSE,) 8 MG/3ML SOPN Inject 2 mg into the skin once per week. 04/24/22   Sabino Dick, DO  Vitamin D, Ergocalciferol, (DRISDOL) 1.25 MG (50000 UNIT) CAPS capsule Take 1 capsule (50,000 Units total) by mouth every 7 (seven) days. Sunday Patient not taking: Reported on 04/22/2022 02/26/22   Clapacs, Jackquline Denmark, MD  ipratropium-albuterol (DUONEB) 0.5-2.5 (3) MG/3ML SOLN Take 3 mLs by nebulization 3 (three) times daily as needed. Patient taking differently: Take 3 mLs by nebulization 3 (three) times daily as needed (sob/wheezing). 10/12/20 05/21/21  Fayrene Helper, PA-C      Allergies    Ace inhibitors, Haldol [haloperidol lactate], Nsaids, Entresto [sacubitril-valsartan], and Latex    Review of Systems   Review of Systems  Musculoskeletal:  Positive for back pain.  All other systems reviewed and are negative.   Physical Exam Updated Vital Signs BP 136/84 (BP Location: Right  Arm)   Pulse 98   Temp 98.6 F (37 C)   Resp 16   LMP 06/23/2022 (Exact Date)   SpO2 98%  Physical Exam Vitals and nursing note reviewed.  Constitutional:      General: She is not in acute distress.    Appearance: Normal appearance. She is normal weight. She is not ill-appearing.  HENT:     Head: Normocephalic and atraumatic.  Pulmonary:     Effort: Pulmonary effort is normal. No respiratory distress.  Abdominal:     General: Abdomen is flat.  Musculoskeletal:        General: Normal range of motion.     Cervical back: Neck supple.     Comments: 5 out of 5 strength in dorsiflexion and plantarflexion bilaterally.  Declines straight leg test or hip  strength testing due to pain.  Skin:    General: Skin is warm and dry.  Neurological:     Mental Status: She is alert and oriented to person, place, and time.  Psychiatric:        Mood and Affect: Mood normal.        Behavior: Behavior normal.     ED Results / Procedures / Treatments   Labs (all labs ordered are listed, but only abnormal results are displayed) Labs Reviewed  CBC WITH DIFFERENTIAL/PLATELET - Abnormal; Notable for the following components:      Result Value   Hemoglobin 10.8 (*)    HCT 35.1 (*)    All other components within normal limits  BASIC METABOLIC PANEL    EKG None  Radiology No results found.  Procedures Procedures    Medications Ordered in ED Medications  lidocaine (LIDODERM) 5 % 1 patch (1 patch Transdermal Patch Applied 07/08/22 1352)  HYDROmorphone (DILAUDID) injection 1 mg (1 mg Intravenous Given 07/08/22 1352)  cyclobenzaprine (FLEXERIL) tablet 5 mg (5 mg Oral Given 07/08/22 1352)    ED Course/ Medical Decision Making/ A&P Clinical Course as of 07/08/22 1542  Tue Jul 08, 2022  1420 On reevaluation patient states the pain is still significant.  States she is unable to walk.  Will obtain labs admit for further pain control [AS]  1521 Spoke with family medicine residency program who will  admit [AS]    Clinical Course User Index [AS] Leinani Lisbon, Edsel Petrin, PA-C                             Medical Decision Making Amount and/or Complexity of Data Reviewed Labs: ordered.  Risk Prescription drug management.  This patient presents to the ED for concern of left lower back pain, this involves an extensive number of treatment options, and is a complaint that carries with it a high risk of complications and morbidity.  Emergent considerations in the differential diagnosis of back pain include:occult fracture, congenital anomalies, tumors, vascular catastrophes, osteomyelitis of vertebrae, infections of disc, meninges or cord, space occupying lesions within canal leading to cord or root compression including epidural abscess.   Co morbidities that complicate the patient evaluation   hypertension, diabetes, anxiety, depression, asthma  My initial workup includes pain control  Additional history obtained from: Nursing notes from this visit. EMS provides a portion of the history  I ordered, reviewed and interpreted labs which include: BMP, CBC  Consultations Obtained:  I requested consultation with the hopsitalist/family medicine residency program, and discussed lab and imaging findings as well as pertinent plan - they recommend: Admission for pain control  Afebrile, hemodynamically stable.  52 year old female presents to the ED for evaluation of left lower back pain with sciatica.  Has been present for quite some time.  Has been seen numerous times both in the ED and with the family medicine residency program.  Has trialed opioids, steroids and muscle relaxers without improvement in her symptoms.  This is caused her to miss some work.  She is tearful today with worsening of her pain with mild movement of her legs.  She had an MRI approximately 1 week ago which does show some herniation in the lumbar spine.  This may be the cause of her symptoms.  She does have an ambulatory  referral to neurosurgery but has not scheduled an appointment yet.  Her pain did not improve with Dilaudid, Flexeril and lidocaine patches.  I had a shared decision-making conversation with patient regarding outpatient versus inpatient management.  She is requesting admission stating that this is "the worst pain of my life and it is debilitating."  Patient will be admitted for pain control and possible inpatient neurosurgery consultation.  Patient's case discussed with Dr. Rubin Payor who agrees with plan to admit for pain control.   Note: Portions of this report may have been transcribed using voice recognition software. Every effort was made to ensure accuracy; however, inadvertent computerized transcription errors may still be present.        Final Clinical Impression(s) / ED Diagnoses Final diagnoses:  Acute left-sided low back pain with left-sided sciatica    Rx / DC Orders ED Discharge Orders     None         Mora Bellman 07/08/22 1542    Benjiman Core, MD 07/09/22 [phone]

## 2022-07-08 NOTE — ED Notes (Signed)
ED TO INPATIENT HANDOFF REPORT  ED Nurse Name and Phone #:  Vernona Rieger [phone]  S Name/Age/Gender [name] 52 y.o. female Room/Bed: 042C/042C  Code Status   Code Status: DNR  Home/SNF/Other Home Patient oriented to: self, place, time, and situation Is this baseline? Yes   Triage Complete: Triage complete  Chief Complaint Sciatica associated with disorder of lumbar spine [M53.86]  Triage Note Coming by [facility], PT complaints of sciatica pain since the week before easter, 10 out 10 by EMS. Worse than ever, irradiate to left leg, both feet and bilateral groin. Pt anxious on EMS arrival, HR 140, bp 120/84, 22RR, 146 cbg.  Pt got 3 pushes of of Fentanyl. Total of 150 mcg   Allergies Allergies  Allergen Reactions   Ace Inhibitors Other (See Comments)    Patient had acute renal failure requiring hemodialysis after a suicide attempt.  Nephro recommended avoiding use.   Haldol [Haloperidol Lactate] Other (See Comments)    Tardive dyskinesia   Nsaids Other (See Comments)    Nephro recommended avoiding after acute renal failure requiring hemodialysis associated with a suicide attempt.   Entresto [Sacubitril-Valsartan] Itching   Latex Other (See Comments)    Rash around IV site    Level of Care/Admitting Diagnosis ED Disposition     ED Disposition  Admit   Condition  --   Comment  Hospital Area: [facility] [[id number]]  Level of Care: Med-Surg [16]  May place patient in observation at [facility] or [facility] if equivalent level of care is available:: No  Covid Evaluation: Asymptomatic - no recent exposure (last 10 days) testing not required  Diagnosis: Sciatica associated with disorder of lumbar spine [[id number]]  Admitting Physician: Lorri Frederick [[id number]]  Attending Physician: Nestor Ramp [[id number]]          B Medical/Surgery History Past Medical History:  Diagnosis Date   Acute renal failure (HCC) 05/27/10   hemodialysis for 6 weeks    Anxiety    Asthma    Depression    Diabetes mellitus    Hypertension    Rhabdomyolysis 06/06/10   after drug overdose   Suicide attempt by drug ingestion (HCC) 06/05/10   result rhabdomyolosis and ARF requrining dialysis    Past Surgical History:  Procedure Laterality Date   ANKLE ARTHROSCOPY Right 08/17/2015   Procedure: RIGHT ANKLE ARTHROSCOPY WITH SYNOVECTOMY AND LOOSE BODY EXCISION;  Surgeon: Marcene Corning, MD;  Location: [facility] OR;  Service: Orthopedics;  Laterality: Right;   CHOLECYSTECTOMY     TUBAL LIGATION  1998     A IV Location/Drains/Wounds Patient Lines/Drains/Airways Status     Active Line/Drains/Airways     Name Placement date Placement time Site Days   Peripheral IV 07/08/22 22 G Anterior;Left;Proximal Forearm 07/08/22  1100  Forearm  less than 1            Intake/Output Last 24 hours No intake or output data in the 24 hours ending 07/08/22 1700  Labs/Imaging Results for orders placed or performed during the hospital encounter of 07/08/22 (from the past 48 hour(s))  Basic metabolic panel     Status: Abnormal   Collection Time: 07/08/22  2:45 PM  Result Value Ref Range   Sodium 140 135 - 145 mmol/L   Potassium 4.1 3.5 - 5.1 mmol/L   Chloride 113 (H) 98 - 111 mmol/L   CO2 17 (L) 22 - 32 mmol/L   Glucose, Bld 106 (H) 70 - 99 mg/dL  Comment: Glucose reference range applies only to samples taken after fasting for at least 8 hours.   BUN 14 6 - 20 mg/dL   Creatinine, Ser 1.61 (H) 0.44 - 1.00 mg/dL   Calcium 7.7 (L) 8.9 - 10.3 mg/dL   GFR, Estimated 47 (L) >60 mL/min    Comment: (NOTE) Calculated using the CKD-EPI Creatinine Equation (2021)    Anion gap 10 5 - 15    Comment: Performed at [facility], [address] [place], [place] [address]  CBC with Differential     Status: Abnormal   Collection Time: 07/08/22  2:45 PM  Result Value Ref Range   WBC 7.1 4.0 - 10.5 K/uL   RBC 4.03 3.87 - 5.11 MIL/uL   Hemoglobin 10.8 (L) 12.0 - 15.0 g/dL    HCT 54.0 (L) 98.1 - 46.0 %   MCV 87.1 80.0 - 100.0 fL   MCH 26.8 26.0 - 34.0 pg   MCHC 30.8 30.0 - 36.0 g/dL   RDW 19.1 47.8 - 29.5 %   Platelets 351 150 - 400 K/uL   nRBC 0.0 0.0 - 0.2 %   Neutrophils Relative % 53 %   Neutro Abs 3.7 1.7 - 7.7 K/uL   Lymphocytes Relative 38 %   Lymphs Abs 2.7 0.7 - 4.0 K/uL   Monocytes Relative 7 %   Monocytes Absolute 0.5 0.1 - 1.0 K/uL   Eosinophils Relative 2 %   Eosinophils Absolute 0.2 0.0 - 0.5 K/uL   Basophils Relative 0 %   Basophils Absolute 0.0 0.0 - 0.1 K/uL   Immature Granulocytes 0 %   Abs Immature Granulocytes 0.02 0.00 - 0.07 K/uL    Comment: Performed at [facility], [address] [place], [place] [address]  CBG monitoring, ED     Status: Abnormal   Collection Time: 07/08/22  4:46 PM  Result Value Ref Range   Glucose-Capillary 119 (H) 70 - 99 mg/dL    Comment: Glucose reference range applies only to samples taken after fasting for at least 8 hours.   No results found.  Pending Labs Unresulted Labs (From admission, onward)     Start     Ordered   07/09/22 0500  Sedimentation rate  Tomorrow morning,   R        07/08/22 1639   07/09/22 0500  C-reactive protein  Tomorrow morning,   R        07/08/22 1639   07/09/22 0500  Comprehensive metabolic panel  Tomorrow morning,   R        07/08/22 1639   07/08/22 1633  HIV Antibody (routine testing w rflx)  (HIV Antibody (Routine testing w reflex) panel)  Once,   R        07/08/22 1639   07/08/22 1632  Hemoglobin A1c  Once,   R       Comments: To assess prior glycemic control    07/08/22 1639            Vitals/Pain Today's Vitals   07/08/22 1148 07/08/22 1448 07/08/22 1636 07/08/22 1648  BP:  136/84 (!) 142/84   Pulse:  98 84   Resp:  16 16   Temp:    98.5 F (36.9 C)  TempSrc:    Oral  SpO2:  98% 98%   PainSc: 10-Worst pain ever       Isolation Precautions No active isolations  Medications Medications  lidocaine (LIDODERM) 5 % 1 patch (1 patch  Transdermal Patch Applied 07/08/22 1352)  metoprolol tartrate (LOPRESSOR) tablet 25 mg (has no administration in time range)  isosorbide-hydrALAZINE (BIDIL) 20-37.5 MG per tablet 1 tablet (has no administration in time range)  rosuvastatin (CRESTOR) tablet 20 mg (has no administration in time range)  LORazepam (ATIVAN) tablet 1 mg (has no administration in time range)  levothyroxine (SYNTHROID) tablet 300 mcg (has no administration in time range)  famotidine (PEPCID) tablet 20 mg (has no administration in time range)  mometasone-formoterol (DULERA) 200-5 MCG/ACT inhaler 2 puff (has no administration in time range)  insulin aspart (novoLOG) injection 0-15 Units (has no administration in time range)  enoxaparin (LOVENOX) injection 40 mg (has no administration in time range)  HYDROmorphone (DILAUDID) injection 1 mg (has no administration in time range)  oxyCODONE-acetaminophen (PERCOCET/ROXICET) 5-325 MG per tablet 1 tablet (has no administration in time range)  gabapentin (NEURONTIN) capsule 100 mg (has no administration in time range)  methylPREDNISolone sodium succinate (SOLU-MEDROL) 125 mg/2 mL injection 60 mg (has no administration in time range)  HYDROmorphone (DILAUDID) injection 1 mg (1 mg Intravenous Given 07/08/22 1352)  cyclobenzaprine (FLEXERIL) tablet 5 mg (5 mg Oral Given 07/08/22 1352)    Mobility walks with device     Focused Assessments Back pain with sciatica   R Recommendations: See Admitting Provider Note  Report given to:   Additional Notes: Chairbound due to pain. Ambulates with a cane at baseline

## 2022-07-08 NOTE — Assessment & Plan Note (Addendum)
Hx of MDD, previously prescribed Prozac and Remeron by Isac Sarna NP at [facility].  Per chart review, was recently seen on 4/24 for complaint of tardive dyskinesia, patient describes as tremors and restlessness. It appears Remeron has been discontinued.  Patient reports she is currently not taking her Prozac. Will hold psych meds for now, can follow up OP for further med management.

## 2022-07-08 NOTE — Assessment & Plan Note (Signed)
>>  ASSESSMENT AND PLAN FOR MDD (MAJOR DEPRESSIVE DISORDER) WRITTEN ON 07/08/2022  4:55 PM BY Lorri Frederick, MD  Hx of MDD, previously prescribed Prozac and Remeron by Isac Sarna NP at [facility].  Per chart review, was recently seen on 4/24 for complaint of tardive dyskinesia, patient describes as tremors and restlessness. It appears Remeron has been discontinued.  Patient reports she is currently not taking her Prozac. Will hold psych meds for now, can follow up OP for further med management.

## 2022-07-08 NOTE — Assessment & Plan Note (Addendum)
Hx of CHF, last echo on 05/20/2021 showing LVEF 35-40%. Can consider repeat while inpatient. Appears euvolemic on exam. - Restart home BIDIL TID - Can consider adding home Lasix 40 mg pending volume status, may be a bit volume down at present in the setting of poor PO intake

## 2022-07-08 NOTE — Assessment & Plan Note (Addendum)
Reports symptoms of sciatica began in March, unprovoked w/o hx of injury.  MRI lumbar spine on 4/23 with bilateral facet joint effusions at L4-L5.  Facet joint effusions causing sciatic nerve root compression most likely has an inflammatory etiology with its bilateral presentation along with the lack of infectious symptoms such as fever and normal WBC.  Will consult neurosurgery today regarding their recommendations if injection of the facet joints is appropriate along with outpatient services for possible surgical intervention needs. -Neurosurgery consultation today for further evaluation and management / treatment options. PT/OT consult once pain has improved - Continue percocet Q6hrs   - Dilaudid 1 mg injection Q4hrs PRN for breakthrough pain - Continue Solumedrol 60 mg injection Q6hrs for 24 more hours -Continue Gabapentin 100 mg TID, was previously on Lyrica 75mg  BID at home.  Plan to increase gabapentin to 200 mg 3 times daily starting tonight. - Lidocaine patch QD - Dilaudid 1 mg Q4hr PRN

## 2022-07-08 NOTE — Assessment & Plan Note (Signed)
>>  ASSESSMENT AND PLAN FOR SCIATICA WRITTEN ON 07/09/2022 11:04 AM BY Kizzie Ide B, MD  Reports symptoms of sciatica began in March, unprovoked w/o hx of injury.  MRI lumbar spine on 4/23 with bilateral facet joint effusions at L4-L5.  Facet joint effusions causing sciatic nerve root compression most likely has an inflammatory etiology with its bilateral presentation along with the lack of infectious symptoms such as fever and normal WBC.  Will consult neurosurgery today regarding their recommendations if injection of the facet joints is appropriate along with outpatient services for possible surgical intervention needs. -Neurosurgery consultation today for further evaluation and management / treatment options. PT/OT consult once pain has improved - Continue percocet Q6hrs   - Dilaudid 1 mg injection Q4hrs PRN for breakthrough pain - Continue Solumedrol 60 mg injection Q6hrs for 24 more hours -Continue Gabapentin 100 mg TID, was previously on Lyrica 75mg  BID at home.  Plan to increase gabapentin to 200 mg 3 times daily starting tonight. - Lidocaine patch QD - Dilaudid 1 mg Q4hr PRN

## 2022-07-08 NOTE — Telephone Encounter (Signed)
Patient LVM on nurse line (tearful) asking for a return call.   I called patient back and she reports she called EMS due to the pain being so bad. She was tearful on the phone. Patient reports she is in route to the hospital.   Advised patient I would cancel her apt for this afternoon.

## 2022-07-08 NOTE — H&P (Signed)
CALL PAGER [phone] for any questions or notifications regarding this patient  FMTS Attending Note: Denny Levy MD I agree with the assessment and plan as documented in the resident's H&P which is in process. I have examined the patient in the emergency room and reviewed her chart.  Chief complaint is severe sciatic pain on the left.  This has been ongoing but worse over the last 24 to 48 hours.  Started about a month ago.  Also reviewed her MRI images: Report:Multilevel degenerate age, greatest at at L4-L5 where there are bilateral facet joint effusions with perifacet inflammatory change/arthropathy and mild bilateral foraminal stenosis  Unclear why she has the facet joint effusions but there is definitely some inflammatory change or arthropathy bilaterally at L4-L5.  This would coincide with her pain.  I do not know etiology.  Most likely this is inflammatory.  Review of systems she had no significant infectious symptoms other than she has had a few days of intermittent feeling cold/chills.  No fever.  No sweats.  She also had some hand tremors last week but her doctor changed some of her depression medicines and that went away.  50 pound weight loss in the last year that was intentional.  She is having great difficulty bearing weight on the left foot.  On exam she has some weakness of the right ankle which she tells me is longstanding and she wears a brace for that.  Plantarflexion on the right 4-5 out of 5, on the left 4 out of 5.  Detailed exam was not done because she is in a hallway bed.  We will admit.  Pain control starting with gabapentin and opioids for breakthrough pain.  Will taper up gabapentin quickly.  Given Perry facet inflammatory changes, I will start her on IV Solu-Medrol 60 mg every 6 for the first 24 hours.  Will consider consult back surgeon from either orthopedics or neurosurgery tomorrow. DX: sciatic pain Facet joint effusion Lumbar arthropathy

## 2022-07-08 NOTE — Assessment & Plan Note (Signed)
>>  ASSESSMENT AND PLAN FOR HTN (HYPERTENSION) WRITTEN ON 07/09/2022  8:47 AM BY HOANG, DANIELA B, MD  SBP ranged from 110s-130s. - Restarted home Lopressor 25 mg

## 2022-07-08 NOTE — Telephone Encounter (Signed)
Called patient. She is going to be admitted to [facility] service due to her continued pain and failing conservative outpatient treatments. Discussed that I will see her tomorrow when I start nights. She was appreciative of call.

## 2022-07-08 NOTE — ED Notes (Signed)
PT hasn't been able to take meds since the past 2 days

## 2022-07-08 NOTE — Assessment & Plan Note (Addendum)
VSS - Restart home Lopressor 25 mg

## 2022-07-08 NOTE — H&P (Addendum)
Hospital Admission History and Physical Service Pager: [phone]  Patient name: [name] Medical record number: [id number] Date of Birth: [birth year] Age: 52 y.o. Gender: female  Primary Care Provider: Sabino Dick, DO Consultants: None Code Status: DNR/DNI Preferred Emergency Contact:  Contact Information     Name Relation Home Work Mobile   [place] Spouse [phone]          Chief Complaint: Worsening L sciatica  Assessment and Plan: [name] is a 52 y.o. female presenting with worsening left-sided sciatica pain and new onset functional decline. Pertinent PMHx includes: HTN, CHF, T2DM, hypothyroidism, obesity hypoventilation syndrome, asthma, CKD IV. Differential for this patient's presentation of this includes: Sciatica - as evidenced by subjective description of lumbar pain radiating to LLE, associated with paresthesia/anesthesia. Pain was initially unilateral but now involves bilateral weakness and sensory deficits.  MR lumbar spine concerning for facet joint effusions and bilteral foraminal stenosis that could explain her symptoms.  Inflammatory process of unclear etiology - given peri facet inflammatory changes vs athropy. Work up for infectious process thus far unremarkable, patient afebrile w/ unremarkable CBC.  * Sciatica Presenting with worsening lumbar pain with left-sided sciatic distribution now with acute decline in functional status, pain and mobility refractory to dilaudid and lidocaine patch in ED. Reports symptoms of sciatica began in March, unprovoked w/o hx of injury.  MRI lumbar spine on 4/23 with bilateral facet joint effusions at L4-L5. ?Inflammatory vs infectious process, though no infectious symptoms. Will consult IR tomorrow for possible aspiration of these effusions, though they are relatively small (~2x3cm), unclear if this will be a suitable target. Vitals reassuring, has remained normotensive, afebrile. WBC WNL.  - Admit to FMTS,  attending Dr. Jennette Kettle  - Consider consult to IR,  ortho or NSU tomorrow for further evaluation of options for management - PT/OT consult once pain has improved - Schedule percocet Q6hrs   - Dilaudid 1 mg injection Q4hrs PRN for breakthrough pain - Start Solumedrol 60 mg injection Q6hrs for 24 hours, possible transition to oral steroids tomorrow. - Start Gabapentin 100 mg TID, was previously on Lyrica 75mg  BID at home - Lidocaine patch QD - Dilaudid 1 mg Q4hr PRN  T2DM (type 2 diabetes mellitus) (HCC) A1C 7.5% on 04/22/2022, glucose 106 on admission in setting of poor po intake. Also has hx of peripheral neuropathy. Home meds: Lantus 56 units twice daily, Novolog 15 units  lunch + 20 units dinner, Semaglutide 2mg  injection q1wk, which she has not taken these past 2-3 days. Will likely increase in setting of steroids.  - Start mSSI - monitor CBGs  CHF (congestive heart failure) (HCC) Hx of CHF, last echo on 05/20/2021 showing LVEF 35-40%. Can consider repeat while inpatient. Appears euvolemic on exam. - Restart home BIDIL TID - Can consider adding home Lasix 40 mg pending volume status, may be a bit volume down at present in the setting of poor PO intake    MDD (major depressive disorder) Hx of MDD, previously prescribed Prozac and Remeron by [name] at [facility].  Per chart review, was recently seen on 4/24 for complaint of tardive dyskinesia, patient describes as tremors and restlessness. It appears Remeron has been discontinued.  Patient reports she is currently not taking her Prozac. Will hold psych meds for now, can follow up OP for further med management.  HTN (hypertension) VSS - Restart home Lopressor 25 mg   Chronic kidney disease (CKD) Hx of CKD stage 4, GFR 47 on admission.  -  Avoiding nephrotoxic agents   All other chronic conditions stable: Hypothyroidism - restart home synthroid 300 mcg HLD- restart home Crestor 20 mg Asthma- Dulera inhaler BID  FEN/GI:  Regular VTE Prophylaxis: Lovenox  Disposition: Med-Surg  History of Present Illness:  [name] is a 52 y.o. female presenting with worsening sciatica pain.  Reports her symptoms began back in March, has been in and out of urgent care doctors offices since then and is on able to recall any specific inciting event or injuries.  Reports that on day of admission, patient grew concerned that she was unable to ambulate, reports bilateral weakness and anesthesia at the right foot.  Rates her pain as 9/10, with minimal improvement in symptoms after ED medications (3x fentanyl 50 mcg pushes with EMS, 1mg  IV dilaudid in the ED, and lidocaine patch).  Has had poor p.o. intake and poor sleep for the past 2 to 3 days due to her pain, reports she has not taken any of her prescribed medications.  Regarding her most recent visit to ED on 4/26, she was unable to pick up her prescription of oxycodone as she is unable to afford out-of-pocket costs.  Is tearful throughout the interview, concerned about her limitations and inability to work.  Appreciative of being admitted and worked up.   In the ED, patient presenting via EMS for worsening sciatica pain limited mobility.  Vitals BP 120/84, pulse 140, and CBG 146.  Received 3 pushes of 50 mcg fentanyl with EMS, received additional dose of Dilaudid and lidocaine patch with no improvement in pain.  Patient unable to walk. CBC unremarkable.  BMP showing glucose 106, creatinine 1.35, bicarb 17.  Review Of Systems: Negative except as described in HPI above.  Pertinent Past Medical History: CKD stage IV T2DM CHF, HFrEF HTN Obesity hypoventilation syndrome/OSA Hypothyroidism HLD Aspirin MDD Remainder reviewed in history tab.   Pertinent Past Surgical History: Per chart review previous orthopedic and bariatric surgery in the past. Remainder reviewed in history tab.   Pertinent Social History: Tobacco use: Denies Alcohol use: Denies Other Substance use:  Denies Lives with husband and son in [place]. Works as a Conservation officer, nature.  Pertinent Family History: Remainder reviewed in history tab.   Important Outpatient Medications: Albuterol inhaler every 6 hours as needed for COPD Flonase Fluticasone-salmeterol nasal spray Flexeril 10 mg 3 times daily as needed for muscle spasms Pepcid 20 mg  Prozac 40 mg daily Remeron 15 mg nightly Lasix 40 mg daily Vit D Remainder reviewed in medication history.   Objective: BP (!) 142/84 (BP Location: Right Arm)   Pulse 84   Temp 98.5 F (36.9 C) (Oral)   Resp 16   LMP 06/23/2022 (Exact Date)   SpO2 98%  Exam: General: Appears stated age, anxious, tearful, in distress Eyes: anicteric, EOM grossly normal Neck: supple, no masses or thyromegaly Cardiovascular: RRR, no M/R/G Respiratory: NWB on RA, CTAB Gastrointestinal: Nontender, nondistended, normal active bowel sounds MSK: Limited ROM of BLE Derm: Warm and dry Neuro: Alert awake and oriented x 4, decreased sensation to touch in the distal feet--symmetric, no other deficits to strength or sesnation. Full exam precluded by patient being in a hallway bed Psych: Appropriate mood and affect  Labs:     Latest Ref Rng & Units 07/08/2022    2:45 PM  CBC  WBC 4.0 - 10.5 K/uL 7.1   Hemoglobin 12.0 - 15.0 g/dL 96.0   Hematocrit 45.4 - 46.0 % 35.1   Platelets 150 - 400 K/uL  351         Latest Ref Rng & Units 07/08/2022    2:45 PM  BMP  Glucose 70 - 99 mg/dL 161   BUN 6 - 20 mg/dL 14   Creatinine 0.96 - 1.00 mg/dL 0.45   Sodium 409 - 811 mmol/L 140   Potassium 3.5 - 5.1 mmol/L 4.1   Chloride 98 - 111 mmol/L 113   CO2 22 - 32 mmol/L 17   Calcium 8.9 - 10.3 mg/dL 7.7     Pertinent additional labs: None   Imaging Studies Performed: MR Lumbar Spine Wo Contrast  Result Date: 07/01/2022 IMPRESSION: Multilevel degenerate age, greatest at at L4-L5 where there are bilateral facet joint effusions with perifacet inflammatory change/arthropathy and  mild bilateral foraminal stenosis.  My Interpretation: Agree with findings above   Lorri Frederick, MD 07/08/2022, 6:15 PM PGY-1, [facility] FPTS Intern pager: [phone], text pages welcome Secure chat group [facility] Teaching Service     I have evaluated this patient along with Dr. Theodis Aguas and reviewed the above note, making necessary revisions.  Dorothyann Gibbs, MD 07/08/2022, 6:17 PM PGY-2, [facility] Call Pager [phone] for any questions or notifications regarding this patient  FMTS Attending Admission Note: Denny Levy MD Attending pager:[phone] I  have seen and examined this patient, reviewed their chart. I have discussed this patient with the resident. I agree with the resident's findings, assessment and care plan.

## 2022-07-08 NOTE — Assessment & Plan Note (Addendum)
Hx of CKD stage 4, GFR 47 on admission. K this morning is 5.3, morning K repeated was 5.5. -1 dose Lokelma 10 g -Repeat BMP at 3 PM today -Avoiding nephrotoxic agents

## 2022-07-08 NOTE — ED Triage Notes (Signed)
Coming by [facility], PT complaints of sciatica pain since the week before easter, 10 out 10 by EMS. Worse than ever, irradiate to left leg, both feet and bilateral groin. Pt anxious on EMS arrival, HR 140, bp 120/84, 22RR, 146 cbg.  Pt got 3 pushes of of Fentanyl. Total of 150 mcg

## 2022-07-08 NOTE — Progress Notes (Addendum)
Saw patient during night rounds, who was admitted this afternoon for lower back pain and concerns for sciatica.  On arrival to patient's room, she was laying down comfortably in bed.  Patient s concerned about her lower back pain which started March 1 and has progressively gotten worse in the last 3 weeks requiring ambulation with cane due to concerns for fall.  She says she has tried outpatient management which have not had any significant improvement and decided to come to the hospital due to progressive difficulty with ambulation related to her back pain in the last 24 hours.  Patient stated couple of weeks after initial lower back pain, she started noticing some right gluteal numbness that eventually became present on the left gluteal muscle.  Additionally she mentioned having numbness on her right toes and left foot.  No bowel or urinary incontinence but endorses pelvic pins-and-needles.  She is currently stable now however reports back pain to be worse with positional changes.  Vitals:   07/08/22 1840 07/08/22 2009  BP: 132/84 (!) 141/80  Pulse: 80 97  Resp: 20 20  Temp: 99.1 F (37.3 C) 99.4 F (37.4 C)  SpO2: 100% 100%   Physical exam General: Alert, morbidly obese, NAD CV: RRR, no murmurs, normal S1/S2 Pulm: CTAB, good WOB on RA, no crackles or wheezing Abd: Soft, no distension, no tenderness Ext: No BLE edema, normal strength on BLE with decreased sensation on both feet.  Back pain: Unclear cause of patient's back pain and however imaging shows bilateral L4-L5 joint facet effusion with potential ongoing inflammatory process.  Currently managing pain with gabapentin and opioid.  Has received IV steroid to hopefully reduce any inflammatory process that could be attributing to her symptoms.  Will look to consult orthopedic or neurosurgery for second opinion and recommendations.  Earlier tonight ordered hydroxyzine due to patient's complaint of generalized pruritus likely secondary to  opioid use.  Will continue to monitor patient closely.

## 2022-07-08 NOTE — Assessment & Plan Note (Addendum)
A1C 7.7% this admission, glucose in 300s today in setting of poor po intake. Also has hx of peripheral neuropathy. Home meds: Lantus 65 units twice daily, Novolog 15 units  lunch + 20 units dinner, Semaglutide 2mg  injection q1wk, which she has not taken these past 2-3 days. Will likely increase in setting of steroids.  Will start long-acting insulin 30 units twice daily with close monitoring.  If blood sugar continues to increase, will add mealtime insulin. - mSSI - Start glargine 30 units twice daily -Continue to monitor, if increasing will add mealtime short acting insulin - monitor CBGs

## 2022-07-08 NOTE — Telephone Encounter (Signed)
Prior Auth for patients medication OXYCODONE/ACETAMINO. approved by [facility] from 07/07/22 to 01/05/23.  CoverMyMeds KeyHaynes Hoehn PA Case ID #: [id number]

## 2022-07-09 ENCOUNTER — Other Ambulatory Visit: Payer: Self-pay | Admitting: Family Medicine

## 2022-07-09 DIAGNOSIS — M2548 Effusion, other site: Secondary | ICD-10-CM

## 2022-07-09 DIAGNOSIS — M5442 Lumbago with sciatica, left side: Secondary | ICD-10-CM | POA: Diagnosis present

## 2022-07-09 DIAGNOSIS — Z79899 Other long term (current) drug therapy: Secondary | ICD-10-CM | POA: Diagnosis not present

## 2022-07-09 DIAGNOSIS — E785 Hyperlipidemia, unspecified: Secondary | ICD-10-CM | POA: Diagnosis present

## 2022-07-09 DIAGNOSIS — Z794 Long term (current) use of insulin: Secondary | ICD-10-CM | POA: Diagnosis not present

## 2022-07-09 DIAGNOSIS — M5386 Other specified dorsopathies, lumbar region: Secondary | ICD-10-CM | POA: Diagnosis present

## 2022-07-09 DIAGNOSIS — F329 Major depressive disorder, single episode, unspecified: Secondary | ICD-10-CM | POA: Diagnosis present

## 2022-07-09 DIAGNOSIS — E039 Hypothyroidism, unspecified: Secondary | ICD-10-CM | POA: Diagnosis present

## 2022-07-09 DIAGNOSIS — Z66 Do not resuscitate: Secondary | ICD-10-CM | POA: Diagnosis present

## 2022-07-09 DIAGNOSIS — Z886 Allergy status to analgesic agent status: Secondary | ICD-10-CM | POA: Diagnosis not present

## 2022-07-09 DIAGNOSIS — E1122 Type 2 diabetes mellitus with diabetic chronic kidney disease: Secondary | ICD-10-CM | POA: Diagnosis present

## 2022-07-09 DIAGNOSIS — M545 Low back pain, unspecified: Secondary | ICD-10-CM | POA: Diagnosis present

## 2022-07-09 DIAGNOSIS — I5042 Chronic combined systolic (congestive) and diastolic (congestive) heart failure: Secondary | ICD-10-CM | POA: Diagnosis present

## 2022-07-09 DIAGNOSIS — I130 Hypertensive heart and chronic kidney disease with heart failure and stage 1 through stage 4 chronic kidney disease, or unspecified chronic kidney disease: Secondary | ICD-10-CM | POA: Diagnosis present

## 2022-07-09 DIAGNOSIS — G2401 Drug induced subacute dyskinesia: Secondary | ICD-10-CM | POA: Diagnosis present

## 2022-07-09 DIAGNOSIS — N184 Chronic kidney disease, stage 4 (severe): Secondary | ICD-10-CM | POA: Diagnosis present

## 2022-07-09 DIAGNOSIS — Z79890 Hormone replacement therapy (postmenopausal): Secondary | ICD-10-CM | POA: Diagnosis not present

## 2022-07-09 DIAGNOSIS — Z5329 Procedure and treatment not carried out because of patient's decision for other reasons: Secondary | ICD-10-CM | POA: Diagnosis present

## 2022-07-09 DIAGNOSIS — E1142 Type 2 diabetes mellitus with diabetic polyneuropathy: Secondary | ICD-10-CM | POA: Diagnosis present

## 2022-07-09 DIAGNOSIS — Z91040 Latex allergy status: Secondary | ICD-10-CM | POA: Diagnosis not present

## 2022-07-09 DIAGNOSIS — E662 Morbid (severe) obesity with alveolar hypoventilation: Secondary | ICD-10-CM | POA: Diagnosis present

## 2022-07-09 DIAGNOSIS — J45909 Unspecified asthma, uncomplicated: Secondary | ICD-10-CM | POA: Diagnosis present

## 2022-07-09 DIAGNOSIS — Z888 Allergy status to other drugs, medicaments and biological substances status: Secondary | ICD-10-CM | POA: Diagnosis not present

## 2022-07-09 DIAGNOSIS — M129 Arthropathy, unspecified: Secondary | ICD-10-CM | POA: Diagnosis present

## 2022-07-09 LAB — COMPREHENSIVE METABOLIC PANEL
ALT: 19 U/L (ref 0–44)
AST: 23 U/L (ref 15–41)
Albumin: 3.5 g/dL (ref 3.5–5.0)
Alkaline Phosphatase: 56 U/L (ref 38–126)
Anion gap: 8 (ref 5–15)
BUN: 17 mg/dL (ref 6–20)
CO2: 22 mmol/L (ref 22–32)
Calcium: 9.3 mg/dL (ref 8.9–10.3)
Chloride: 105 mmol/L (ref 98–111)
Creatinine, Ser: 1.77 mg/dL — ABNORMAL HIGH (ref 0.44–1.00)
GFR, Estimated: 34 mL/min — ABNORMAL LOW (ref >60)
Glucose, Bld: 319 mg/dL — ABNORMAL HIGH (ref 70–99)
Potassium: 5.3 mmol/L — ABNORMAL HIGH (ref 3.5–5.1)
Sodium: 135 mmol/L (ref 135–145)
Total Bilirubin: 0.4 mg/dL (ref 0.3–1.2)
Total Protein: 7.8 g/dL (ref 6.5–8.1)

## 2022-07-09 LAB — BASIC METABOLIC PANEL
Anion gap: 7 (ref 5–15)
Anion gap: 10 (ref 5–15)
BUN: 20 mg/dL (ref 6–20)
BUN: 24 mg/dL — ABNORMAL HIGH (ref 6–20)
CO2: 24 mmol/L (ref 22–32)
CO2: 22 mmol/L (ref 22–32)
Calcium: 9.1 mg/dL (ref 8.9–10.3)
Calcium: 8.9 mg/dL (ref 8.9–10.3)
Chloride: 103 mmol/L (ref 98–111)
Chloride: 102 mmol/L (ref 98–111)
Creatinine, Ser: 1.95 mg/dL — ABNORMAL HIGH (ref 0.44–1.00)
Creatinine, Ser: 2.1 mg/dL — ABNORMAL HIGH (ref 0.44–1.00)
GFR, Estimated: 30 mL/min — ABNORMAL LOW (ref >60)
GFR, Estimated: 28 mL/min — ABNORMAL LOW (ref >60)
Glucose, Bld: 349 mg/dL — ABNORMAL HIGH (ref 70–99)
Glucose, Bld: 310 mg/dL — ABNORMAL HIGH (ref 70–99)
Potassium: 5.5 mmol/L — ABNORMAL HIGH (ref 3.5–5.1)
Potassium: 5.4 mmol/L — ABNORMAL HIGH (ref 3.5–5.1)
Sodium: 134 mmol/L — ABNORMAL LOW (ref 135–145)
Sodium: 134 mmol/L — ABNORMAL LOW (ref 135–145)

## 2022-07-09 LAB — HEMOGLOBIN A1C
Hgb A1c MFr Bld: 7.7 % — ABNORMAL HIGH (ref 4.8–5.6)
Mean Plasma Glucose: 174.29 mg/dL

## 2022-07-09 LAB — C-REACTIVE PROTEIN: CRP: 1.5 mg/dL — ABNORMAL HIGH (ref <1.0)

## 2022-07-09 LAB — GLUCOSE, CAPILLARY
Glucose-Capillary: 315 mg/dL — ABNORMAL HIGH (ref 70–99)
Glucose-Capillary: 379 mg/dL — ABNORMAL HIGH (ref 70–99)
Glucose-Capillary: 306 mg/dL — ABNORMAL HIGH (ref 70–99)

## 2022-07-09 LAB — SEDIMENTATION RATE: Sed Rate: 34 mm/hr — ABNORMAL HIGH (ref 0–22)

## 2022-07-09 LAB — HIV ANTIBODY (ROUTINE TESTING W REFLEX): HIV Screen 4th Generation wRfx: NONREACTIVE

## 2022-07-09 MED ORDER — SODIUM ZIRCONIUM CYCLOSILICATE 10 G PO PACK
10.0000 g | PACK | Freq: Once | ORAL | Status: AC
  Administered 2022-07-09: 10 g via ORAL
  Filled 2022-07-09: qty 1

## 2022-07-09 MED ORDER — GABAPENTIN 100 MG PO CAPS
200.0000 mg | ORAL_CAPSULE | Freq: Three times a day (TID) | ORAL | Status: DC
  Filled 2022-07-09: qty 2

## 2022-07-09 MED ORDER — GABAPENTIN 100 MG PO CAPS: 200.0000 mg | ORAL_CAPSULE | Freq: Three times a day (TID) | ORAL | 0 refills | Status: DC

## 2022-07-09 MED ORDER — INSULIN ASPART 100 UNIT/ML IJ SOLN: 0.0000 [IU] | Freq: Three times a day (TID) | INTRAMUSCULAR | Status: DC

## 2022-07-09 MED ORDER — PREDNISONE 10 MG (21) PO TBPK: ORAL_TABLET | ORAL | 0 refills | Status: DC

## 2022-07-09 MED ORDER — INSULIN GLARGINE-YFGN 100 UNIT/ML ~~LOC~~ SOLN
30.0000 [IU] | Freq: Two times a day (BID) | SUBCUTANEOUS | Status: DC
  Administered 2022-07-09: 30 [IU] via SUBCUTANEOUS
  Filled 2022-07-09 (×2): qty 0.3

## 2022-07-09 MED ORDER — INSULIN GLARGINE-YFGN 100 UNIT/ML ~~LOC~~ SOLN
40.0000 [IU] | Freq: Two times a day (BID) | SUBCUTANEOUS | Status: DC
  Filled 2022-07-09 (×2): qty 0.4

## 2022-07-09 MED ORDER — INSULIN ASPART 100 UNIT/ML IJ SOLN: 0.0000 [IU] | Freq: Every day | INTRAMUSCULAR | Status: DC

## 2022-07-09 NOTE — Progress Notes (Signed)
This RN went to take patient her scheduled bedtime medications. Patient stated "I'm not taking any of those. I took my wristbands off. They aren't doing anything for me and are going to send me home! I'm going to die! They didn't come see me today.They aren't going to drain my back or give me a pain shot. I'm ready to go." This RN validated her frustrations and asked if she would like to speak with her doctor. Patient asked kindly to do so.   Dr. Melba Coon notified at 2110 and states she will come speak with patient.   Dr. Melba Coon came to see patient at bedside and informs this RN that patient will be leaving against medical advice.  This RN verbalized AMA paperwork directly to patient and patient verbalized understanding of leaving and signing AMA form. Patient has no questions but is requesting a work note from the doctor. This RN notified Dr. Melba Coon and she placed a work note on patient's chart.  Patient escorted by wheelchair with all personal belongings to meet at main entrance where she is being picked up by her son.

## 2022-07-09 NOTE — Discharge Instructions (Signed)
You were hospitalized at [facility] due to uncontrolled back pain.  We expect this is from the degenerative changes to your spine and some inflammation. We would recommend pain management referral for injections to your back which may help with your pain. In the hospital, your lyrica was switched to gabapentin. Continue the gabapentin. STOP the lyrica. You can use the Percocets previously prescribed for breakthrough pain. We are sending a steroid taper for you. Check your sugars while on steroids as your insulin may need to be adjusted. Please also be sure to follow-up with our clinic/PCP at your earliest convenience.  Thank you for allowing [name] to take care of you.  Take care, [facility] family medicine team

## 2022-07-09 NOTE — Progress Notes (Signed)
FMTS Interim Progress Note  Spoke with NSGY on-call Dr. Lovell Sheehan. He reviewed patient cases and images. States patient is not a surgical candidate given body habitus and co-morbidities and would recommend pain management referral for symptom management. States can do facet injections outpatient, unsure if IR will do them inpatient.   Elberta Fortis, MD 07/09/2022, 12:36 PM PGY-1, [facility] Family Medicine Service pager [phone]

## 2022-07-09 NOTE — Progress Notes (Signed)
Interim Progress Note  Went in to check on patient as I am her PCP.  She was frustrated that she had not seen any of the specialists or have been updated with the plan.  Advised her that she is not a neurosurgery candidate and that IR would not be able to do facet injections inpatient but this would need to be done outpatient. She was frustrated and tearful, stated that she wanted to be discharged home in that case.   Discussed that the plan could possibly be for d/c home tomorrow. Would recommend that she work with PT prior to discharge. Team could hopefully help to get her scheduled with IR outpatient prior to d/c. She was unwilling to work with PT but at the time was amenable to discharge tomorrow.    Shortly after, received secure chat message from RN that patient was frustrated that no one had come in to see her and that she wanted to go home.   Went in to see patient again. She was sitting at the edge of her bed. She expressed frustration that she has been dealing with this pain for several weeks now and feels that nothing has been done. I apologized and acknowledged that it must be very frustrating. I asked her what her plans would be at home, and she said she would try to rest. She denied any thoughts of self-harm when asked directly (given her history of two prior SI attempts). I was able to speak to her son [name] who confirms he lives with her and will keep a close eye on her tonight. He was hopeful that she would stay the night but states he will come to pick her up if she decides she wants to go home.   Patient was unable to walk or stand up unassisted. Because of this, discussed that it would be unsafe for her to be discharged at this time. She was willing to be discharged against medical advice. Reports that her son will bring the cane she has been using at home.

## 2022-07-09 NOTE — Progress Notes (Addendum)
Daily Progress Note Intern Pager: [phone]  Patient name: [name] Medical record number: [id number] Date of birth: 11-08-1970 Age: 52 y.o. Gender: female  Primary Care Provider: Sabino Dick, DO Consultants: IR Code Status: DNR/DNI  Pt Overview and Major Events to Date:  4/30-admitted 5/1-neurosurgery consult  Assessment and Plan: [name] is a 52 y.o. female presenting with worsening left-sided sciatica pain and new onset functional decline.   * Sciatica Reports symptoms of sciatica began in March, unprovoked w/o hx of injury.  MRI lumbar spine on 4/23 with bilateral facet joint effusions at L4-L5.  Facet joint effusions causing sciatic nerve root compression most likely has an inflammatory etiology with its bilateral presentation along with the lack of infectious symptoms such as fever and normal WBC.  Will consult neurosurgery today regarding their recommendations if injection of the facet joints is appropriate along with outpatient services for possible surgical intervention needs. -Neurosurgery consultation today for further evaluation and management / treatment options. PT/OT consult once pain has improved - Continue percocet Q6hrs   - Dilaudid 1 mg injection Q4hrs PRN for breakthrough pain - Continue Solumedrol 60 mg injection Q6hrs for 24 more hours -Continue Gabapentin 100 mg TID, was previously on Lyrica 75mg  BID at home.  Plan to increase gabapentin to 200 mg 3 times daily starting tonight. - Lidocaine patch QD - Dilaudid 1 mg Q4hr PRN  CHF (congestive heart failure) (HCC) Hx of CHF, last echo on 05/20/2021 showing LVEF 35-40%. Can consider repeat while inpatient. Appears euvolemic on exam. - Restart home BIDIL TID - Can consider adding home Lasix 40 mg pending volume status, may be a bit volume down at present in the setting of poor PO intake    MDD (major depressive disorder) Hx of MDD, previously prescribed Prozac and Remeron by Isac Sarna NP at [facility].  Per chart review, was recently seen on 4/24 for complaint of tardive dyskinesia, patient describes as tremors and restlessness. It appears Remeron has been discontinued.  Patient reports she is currently not taking her Prozac. Will hold psych meds for now, can follow up OP for further med management.  HTN (hypertension) SBP ranged from 110s-130s. - Restarted home Lopressor 25 mg   Chronic kidney disease (CKD) Hx of CKD stage 4, GFR 47 on admission. K this morning is 5.3, morning K repeated was 5.5. -1 dose Lokelma 10 g -Repeat BMP at 3 PM today -Avoiding nephrotoxic agents  T2DM (type 2 diabetes mellitus) (HCC) A1C 7.7% this admission, glucose in 300s today in setting of poor po intake. Also has hx of peripheral neuropathy. Home meds: Lantus 65 units twice daily, Novolog 15 units  lunch + 20 units dinner, Semaglutide 2mg  injection q1wk, which she has not taken these past 2-3 days. Will likely increase in setting of steroids.  Will start long-acting insulin 30 units twice daily with close monitoring.  If blood sugar continues to increase, will add mealtime insulin. - mSSI - Start glargine 30 units twice daily -Continue to monitor, if increasing will add mealtime short acting insulin - monitor CBGs   FEN/GI: Regular VTE Prophylaxis: Lovenox  Subjective:  Patient seen at bedside this morning.  Patient is tearful regarding her pain.  She reports feeling pain on her lower back which radiates down her left side.  She also has diminished sensation on her left dorsal foot.  She reports urinating last night and the urine was of a dark color.  Objective: Temp:  [97.8 F (36.6  C)-99.4 F (37.4 C)] 98.7 F (37.1 C) (05/01 0753) Pulse Rate:  [80-100] 87 (05/01 0753) Resp:  [16-24] 18 (05/01 0442) BP: (113-142)/(72-90) 134/90 (05/01 0753) SpO2:  [98 %-100 %] 100 % (05/01 0753) Physical Exam: General: Obese female, anxious and tearful Cardiovascular: Regular rate and  rhythm, no M/R/G Respiratory: O2 sat 100% on room air MSK: Tenderness on palpation on lower back Neuro: Diminished sensation on bilateral dorsal feet  Laboratory: Most recent CBC Lab Results  Component Value Date   WBC 7.1 07/08/2022   HGB 10.8 (L) 07/08/2022   HCT 35.1 (L) 07/08/2022   MCV 87.1 07/08/2022   PLT 351 07/08/2022   Most recent BMP    Latest Ref Rng & Units 07/09/2022    8:20 AM  BMP  Glucose 70 - 99 mg/dL 161   BUN 6 - 20 mg/dL 20   Creatinine 0.96 - 1.00 mg/dL 0.45   Sodium 409 - 811 mmol/L 134   Potassium 3.5 - 5.1 mmol/L 5.5   Chloride 98 - 111 mmol/L 103   CO2 22 - 32 mmol/L 24   Calcium 8.9 - 10.3 mg/dL 9.1     Other pertinent labs  A1c 7.7  Imaging/Diagnostic Tests: MR Lumbar Spine Wo Contrast  Result Date: 07/01/2022 IMPRESSION: 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  Lance Muss, MD 07/09/2022, 11:05 AM  PGY-1, [facility] Medicine FPTS Intern pager: [phone], text pages welcome Secure chat group [facility] Teaching Service

## 2022-07-09 NOTE — Progress Notes (Signed)
  Transition of Care [facility]) Screening Note   Patient Details  Name: [name] Date of Birth: September 16, 1970   Transition of Care [facility]) CM/SW Contact:    Harriet Masson, RN Phone Number: 07/09/2022, 11:34 AM    Transition of Care Department [facility]) has reviewed patient and no TOC needs have been identified at this time. We will continue to monitor patient advancement through interdisciplinary progression rounds. If new patient transition needs arise, please place a TOC consult.

## 2022-07-09 NOTE — Discharge Summary (Cosign Needed Addendum)
Family Medicine Teaching [facility] AMA Summary  Patient name: [name] Medical record number: [id number] Date of birth: 08-22-1970 Age: 52 y.o. Gender: female Date of Admission: 07/08/2022  Left AMA: 07/09/22 Admitting Physician: Samantha Frederick, MD  Primary Care Provider: Sabino Dick, DO Consultants: NSGY curbsided, did not formally see    Indication for Hospitalization: Uncontrolled pain from sciatica   Brief Hospital Course:  [name] is a 52 y.o. female who presented to the ED on 4/30 for continued and worsening left sciatica. She was admitted to the [facility] Medicine Teaching Service and ultimately left AMA the following day. Her brief hospital course is listed below. For additional information, please refer to the H&P.  Sciatica Patient had an outside MRI of her lumbar spine performed on 4/23 which showed multilevel degenerative changes and bilateral facet joint effusions at L4-5 with perifacet inflammatory change and mild b/l foraminal stenosis. She had failed outpatient conservative therapies and had functional decline due to her significant pain so she presented to the ED. In the ED she received a lidocaine patch, IV Dilaudid, Flexeril, without significant improvement in her pain. She was ultimately admitted to our service for further pain control: was given scheduled percocet 5-325 mg q6h, and 1mg  dilaudid IV q4h PRN for breakthrough pain. Her lyrica was switched to gabapentin. She was started on 60 mg solu-medrol q6h as well. Unfortunately she declined PT during her admission. Neurosurgery was consulted on 5/1 and reports patient is not a surgical candidate given body habitus and multiple co-morbidities, recommended pain management referral for symptom management. Patient ultimately decided to leave AGAINST MEDICAL ADVICE ON 5/1. She was unable to ambulate independently at that time.   Other chronic conditions stable: T2DM, HFpEF, HTN, CKD,  hypothyroidism.   Follow up:  1) Referral to Pain Management outpatient for facet injections 2) Referral to PT if patient amenable 3) Insulin may need to be adjusted given steroid use  4) Recheck BMP. Had hyperkalemia up to 5.5 requiring Lokelma during admission(5.4 on d/c)   Discharge Diagnoses/Problem List:  Principal Problem:   Sciatica Active Problems:   T2DM (type 2 diabetes mellitus) (HCC)   Chronic kidney disease (CKD)   HTN (hypertension)   MDD (major depressive disorder)   CHF (congestive heart failure) (HCC)   Acute left-sided low back pain with left-sided sciatica   Sciatica associated with disorder of lumbar spine  Disposition: Home  Discharge Condition: Stable but unable to ambulate independently   Discharge Exam:  Blood pressure 124/60, pulse 90, temperature 98.6 F (37 C), temperature source Oral, resp. rate 16, last menstrual period 06/23/2022, SpO2 100 %. Gen: Intermittently tearful, appears stated age, sitting on edge of bed Resp: Normal WOB on room air MSK: Unable to ambulate independently, unable to stand independently  Psych: Tearful, depressed mood  Significant Procedures: None  Significant Labs and Imaging:  Recent Labs  Lab 07/08/22 1445  WBC 7.1  HGB 10.8*  HCT 35.1*  PLT 351   Recent Labs  Lab 07/08/22 1445 07/09/22 0438 07/09/22 0820 07/09/22 1459  NA 140 135 134* 134*  K 4.1 5.3* 5.5* 5.4*  CL 113* 105 103 102  CO2 17* 22 24 22   GLUCOSE 106* 319* 349* 310*  BUN 14 17 20  24*  CREATININE 1.35* 1.77* 1.95* 2.10*  CALCIUM 7.7* 9.3 9.1 8.9  ALKPHOS  --  56  --   --   AST  --  23  --   --   ALT  --  19  --   --   ALBUMIN  --  3.5  --   --     Results/Tests Pending at Time of Discharge: None   Discharge Medications:  Allergies as of 07/09/2022       Reactions   Ace Inhibitors Other (See Comments)   Patient had acute renal failure requiring hemodialysis after a suicide attempt.  Nephro recommended avoiding use.   Haldol  [haloperidol Lactate] Other (See Comments)   Tardive dyskinesia   Nsaids Other (See Comments)   Nephro recommended avoiding after acute renal failure requiring hemodialysis associated with a suicide attempt.   Entresto [sacubitril-valsartan] Itching   Latex Other (See Comments)   Rash around IV site        Medication List     STOP taking these medications    pregabalin 75 MG capsule Commonly known as: Lyrica   Vitamin D (Ergocalciferol) 1.25 MG (50000 UNIT) Caps capsule Commonly known as: DRISDOL       TAKE these medications    albuterol 108 (90 Base) MCG/ACT inhaler Commonly known as: VENTOLIN HFA Inhale 2 puffs into the lungs every 6 (six) hours as needed for shortness of breath.   blood glucose meter kit and supplies Kit Dispense based on patient and insurance preference. Use up to four times daily as directed.   cyclobenzaprine 10 MG tablet Commonly known as: FLEXERIL Take 1 tablet (10 mg total) by mouth 3 (three) times daily as needed for muscle spasms.   dicyclomine 20 MG tablet Commonly known as: BENTYL Take 1 tablet (20 mg total) by mouth 3 (three) times daily as needed for spasms (abdominal pain).   famotidine 20 MG tablet Commonly known as: PEPCID Take 1 tablet (20 mg total) by mouth 2 (two) times daily.   FLUoxetine 40 MG capsule Commonly known as: PROZAC Take 2 capsules (80 mg total) by mouth daily.   fluticasone 50 MCG/ACT nasal spray Commonly known as: FLONASE Place 1 spray into both nostrils daily. 1 spray in each nostril every day   fluticasone-salmeterol 250-50 MCG/ACT Aepb Commonly known as: Wixela Inhub Inhale 1 puff into the lungs in the morning and at bedtime.   furosemide 40 MG tablet Commonly known as: LASIX Take 1 tablet (40 mg total) by mouth daily.   gabapentin 100 MG capsule Commonly known as: NEURONTIN Take 2 capsules (200 mg total) by mouth 3 (three) times daily.   hydrOXYzine 50 MG tablet Commonly known as: ATARAX Take  1 tablet (50 mg total) by mouth 3 (three) times daily as needed for anxiety.   insulin glargine 100 UNIT/ML Solostar Pen Commonly known as: LANTUS Inject 65 Units into the skin 2 (two) times daily.   Insulin Pen Needle 32G X 4 MM Misc 1 each by Does not apply route 3 (three) times daily.   isosorbide-hydrALAZINE 20-37.5 MG tablet Commonly known as: BIDIL Take 1 tablet by mouth 3 (three) times daily.   levothyroxine 300 MCG tablet Commonly known as: SYNTHROID Take 1 tablet (300 mcg total) by mouth daily at 6 (six) AM.   lidocaine 5 % Commonly known as: Lidoderm Place 1 patch onto the skin daily. Remove & Discard patch within 12 hours or as directed by MD   LORazepam 1 MG tablet Commonly known as: ATIVAN Take 1 tablet by mouth twice daily as needed for anxiety   metoprolol tartrate 25 MG tablet Commonly known as: LOPRESSOR Take 1 tablet (25 mg total) by mouth 2 (two) times daily.   mirtazapine  15 MG tablet Commonly known as: REMERON Take 2 tablets (30 mg total) by mouth at bedtime.   NovoLOG FlexPen 100 UNIT/ML FlexPen Generic drug: insulin aspart INJECT 15 UNITS SUBCUTANEOUSLY WITH LUNCH AND INJECT 20 UNITS SUBCUTANEOUSLY WITH SUPPER   oxyCODONE-acetaminophen 5-325 MG tablet Commonly known as: PERCOCET/ROXICET Take 1 tablet by mouth every 6 (six) hours as needed for severe pain.   Ozempic (2 MG/DOSE) 8 MG/3ML Sopn Generic drug: Semaglutide (2 MG/DOSE) Inject 2 mg into the skin once per week.   predniSONE 10 MG (21) Tbpk tablet Commonly known as: STERAPRED UNI-PAK 21 TAB Take 6 tabs on day 1, then 5 tabs on day 2, 4 tabs on day 3, 3 tab on day 4, 2 tabs on day 5, 1 tab on day 6   rosuvastatin 20 MG tablet Commonly known as: Crestor Take 1 tablet (20 mg total) by mouth daily.       Discharge Instructions: Please refer to Patient Instructions section of EMR for full details.  Patient was counseled important signs and symptoms that should prompt return to medical  care, changes in medications, dietary instructions, activity restrictions, and follow up appointments.   Follow-Up Appointments: Patient to call and schedule.   Samantha Dick, DO 07/09/2022, 11:21 PM PGY-3, [facility]

## 2022-07-09 NOTE — Hospital Course (Addendum)
[name] is a 51 y.o. female who presented to the ED on 4/30 for continued and worsening left sciatica. She was admitted to the [facility] Medicine Teaching Service and ultimately left AMA the following day. Her brief hospital course is listed below. For additional information, please refer to the H&P.  Sciatica Patient had an outside MRI of her lumbar spine performed on 4/23 which showed multilevel degenerative changes and bilateral facet joint effusions at L4-5 with perifacet inflammatory change and mild b/l foraminal stenosis. She had failed outpatient conservative therapies and had functional decline due to her significant pain so she presented to the ED. In the ED she received a lidocaine patch, IV Dilaudid, Flexeril, without significant improvement in her pain. She was ultimately admitted to our service for further pain control: was given scheduled percocet 5-325 mg q6h, and 1mg  dilaudid IV q4h PRN for breakthrough pain. Her lyrica was switched to gabapentin. She was started on 60 mg solu-medrol q6h as well. Unfortunately she declined PT during her admission. Neurosurgery was consulted on 5/1 and reports patient is not a surgical candidate given body habitus and multiple co-morbidities, recommended pain management referral for symptom management. Patient ultimately decided to leave AGAINST MEDICAL ADVICE ON 5/1. She was unable to ambulate independently at that time.   Other chronic conditions stable: T2DM, HFpEF, HTN, CKD, hypothyroidism.   Follow up:  1) Referral to Pain Management outpatient for facet injections 2) Referral to PT if patient amenable 3) Insulin may need to be adjusted given steroid use  4) Recheck BMP. Had hyperkalemia up to 5.5 requiring Lokelma during admission(5.4 on d/c)

## 2022-07-10 ENCOUNTER — Telehealth: Payer: Self-pay

## 2022-07-10 NOTE — Telephone Encounter (Signed)
[facility] Pharmacist calls nurse line in regards to Gabapentin prescription.   He reports their records indicate she is on Lyrica as well. He reports he would not normally dispense both.   Pharmacist advised Lyrica was stopped yesterday at hospital discharge.

## 2022-07-10 NOTE — Transitions of Care (Post Inpatient/ED Visit) (Signed)
   07/10/2022  Name: [name] MRN: [id number] DOB: [birth year]  Today's TOC FU Call Status: Today's TOC FU Call Status:: Unsuccessul Call (1st Attempt) Unsuccessful Call (1st Attempt) Date: 07/10/22  Attempted to reach the patient regarding the most recent Inpatient/ED visit.  Follow Up Plan: Additional outreach attempts will be made to reach the patient to complete the Transitions of Care (Post Inpatient/ED visit) call.   Signature Karena Addison, LPN [facility] Nurse Health Advisor Direct Dial [phone]

## 2022-07-11 NOTE — Transitions of Care (Post Inpatient/ED Visit) (Signed)
07/11/2022  Name: [name] MRN: [id number] DOB: 28-Feb-1971  Today's TOC FU Call Status: Today's TOC FU Call Status:: Successful TOC FU Call Competed Unsuccessful Call (1st Attempt) Date: 07/10/22 [facility] FU Call Complete Date: 07/11/22  Transition Care Management Follow-up Telephone Call Date of Discharge: 07/09/22 Discharge Facility: [facility]) Type of Discharge: Inpatient Admission Primary Inpatient Discharge Diagnosis:: lumbago How have you been since you were released from the hospital?: Same Any questions or concerns?: No  Items Reviewed: Did you receive and understand the discharge instructions provided?: Yes Any new allergies since your discharge?: No Dietary orders reviewed?: Yes Do you have support at home?: Yes People in Home: child(ren), adult  Medications Reviewed Today: Medications Reviewed Today     Reviewed by Karena Addison, LPN (Licensed Practical Nurse) on 07/11/22 at 1442  Med List Status: <None>   Medication Order Taking? Sig Documenting Provider Last Dose Status Informant  albuterol (VENTOLIN HFA) 108 (90 Base) MCG/ACT inhaler [id number] Yes Inhale 2 puffs into the lungs every 6 (six) hours as needed for shortness of breath. Clapacs, Jackquline Denmark, MD Taking Active Self, Pharmacy Records  blood glucose meter kit and supplies KIT [id number] Yes Dispense based on patient and insurance preference. Use up to four times daily as directed. Sabino Dick, DO Taking Active Self, Pharmacy Records  cyclobenzaprine (FLEXERIL) 10 MG tablet [id number] Yes Take 1 tablet (10 mg total) by mouth 3 (three) times daily as needed for muscle spasms. Roxy Horseman, PA-C Taking Active Self, Pharmacy Records  dicyclomine (BENTYL) 20 MG tablet [id number] No Take 1 tablet (20 mg total) by mouth 3 (three) times daily as needed for spasms (abdominal pain).  Patient not taking: Reported on 07/11/2022   Sabino Dick, DO Not Taking Active Self, Pharmacy Records           Med  Note Sabino Dick   Tue Apr 22, 2022  3:53 PM) Has not been needing lately  famotidine (PEPCID) 20 MG tablet [id number] No Take 1 tablet (20 mg total) by mouth 2 (two) times daily.  Patient not taking: Reported on 07/11/2022   Clapacs, Jackquline Denmark, MD Not Taking Active Self, Pharmacy Records           Med Note Cooper Render, CAMILLE N   Tue Jul 08, 2022  5:35 PM) Been taking as needed  FLUoxetine (PROZAC) 40 MG capsule [id number] Yes Take 2 capsules (80 mg total) by mouth daily. Shanna Cisco, NP Taking Active Self, Pharmacy Records  fluticasone [facility]) 50 MCG/ACT nasal spray [id number] Yes Place 1 spray into both nostrils daily. 1 spray in each nostril every day Sabino Dick, DO Taking Active Self, Pharmacy Records           Med Note Cooper Render, CAMILLE N   Tue Jul 08, 2022  5:36 PM) Taking as needed  fluticasone-salmeterol [facility] IN[facility]UB) 250-50 MCG/ACT AEPB [id number] Yes Inhale 1 puff into the lungs in the morning and at bedtime. Sabino Dick, DO Taking Active Self, Pharmacy Records           Med Note Cooper Render, CAMILLE N   Tue Jul 08, 2022  5:36 PM) Been taking as needed  furosemide (LASIX) 40 MG tablet [id number] Yes Take 1 tablet (40 mg total) by mouth daily. Clapacs, Jackquline Denmark, MD Taking Active Self, Pharmacy Records  gabapentin (NEURONTIN) 100 MG capsule [id number] Yes Take 2 capsules (200 mg total) by mouth 3 (three) times daily. Sabino Dick, DO Taking Active   hydrOXYzine (ATARAX) 50 MG tablet [id number]  Yes Take 1 tablet (50 mg total) by mouth 3 (three) times daily as needed for anxiety. Shanna Cisco, NP Taking Active Self, Pharmacy Records  insulin glargine (LANTUS) 100 UNIT/ML Solostar Pen [id number] Yes Inject 65 Units into the skin 2 (two) times daily. Sabino Dick, DO Taking Active Self, Pharmacy Records  Insulin Pen Needle 32G X 4 MM MISC [id number] Yes 1 each by Does not apply route 3 (three) times daily. Sabino Dick, DO Taking Active Self,  Pharmacy Records   Patient taking differently:   Discontinued 05/21/21 1320 (Reorder) isosorbide-hydrALAZINE (BIDIL) 20-37.5 MG tablet [id number] Yes Take 1 tablet by mouth 3 (three) times daily. Clapacs, Jackquline Denmark, MD Taking Active Self, Pharmacy Records  levothyroxine (SYNTHROID) 300 MCG tablet [id number] Yes Take 1 tablet (300 mcg total) by mouth daily at 6 (six) AM. Sabino Dick, DO Taking Active Self, Pharmacy Records  lidocaine (LIDODERM) 5 % [id number] No Place 1 patch onto the skin daily. Remove & Discard patch within 12 hours or as directed by MD  Patient not taking: Reported on 07/11/2022   Roxy Horseman, PA-C Not Taking Active Self, Pharmacy Records           Med Note Cooper Render, CAMILLE N   Tue Jul 08, 2022  5:41 PM) Never picked up  LORazepam (ATIVAN) 1 MG tablet [id number] Yes Take 1 tablet by mouth twice daily as needed for anxiety Sabino Dick, DO Taking Active Self, Pharmacy Records  metoprolol tartrate (LOPRESSOR) 25 MG tablet [id number] Yes Take 1 tablet (25 mg total) by mouth 2 (two) times daily. Clapacs, Jackquline Denmark, MD Taking Active Self, Pharmacy Records  mirtazapine (REMERON) 15 MG tablet [id number] Yes Take 2 tablets (30 mg total) by mouth at bedtime. Shanna Cisco, NP Taking Active Self, Pharmacy Records  NOVOLOG FLEXPEN 100 UNIT/ML FlexPen [id number] Yes INJECT 15 UNITS SUBCUTANEOUSLY WITH LUNCH AND INJECT 20 UNITS SUBCUTANEOUSLY WITH SUPPER Sabino Dick, DO Taking Active Self, Pharmacy Records  oxyCODONE-acetaminophen (PERCOCET/ROXICET) 5-325 MG tablet [id number] No Take 1 tablet by mouth every 6 (six) hours as needed for severe pain.  Patient not taking: Reported on 07/11/2022   Littie Deeds, MD Not Taking Active Self, Pharmacy Records           Med Note Cooper Render, CAMILLE N   Tue Jul 08, 2022  5:43 PM) Not picked up yet  predniSONE (STERAPRED UNI-PAK 21 TAB) 10 MG (21) TBPK tablet [id number] Yes Take 6 tabs on day 1, then 5 tabs on day 2, 4 tabs on day 3, 3 tab  on day 4, 2 tabs on day 5, 1 tab on day 6 Espinoza, Alejandra, DO Taking Active   rosuvastatin (CRESTOR) 20 MG tablet [id number] Yes Take 1 tablet (20 mg total) by mouth daily. Clapacs, Jackquline Denmark, MD Taking Active Self, Pharmacy Records  Semaglutide, 2 MG/DOSE, (OZEMPIC, 2 MG/DOSE,) 8 MG/3ML SOPN [id number] Yes Inject 2 mg into the skin once per week. Sabino Dick, DO Taking Active Self, Pharmacy Records            Home Care and Equipment/Supplies: Were Home Health Services Ordered?: NA Any new equipment or medical supplies ordered?: NA  Functional Questionnaire: Do you need assistance with bathing/showering or dressing?: Yes Do you need assistance with meal preparation?: Yes Do you need assistance with eating?: No Do you have difficulty maintaining continence: No Do you need assistance with getting out of bed/getting out of a chair/moving?: Yes Do you have difficulty managing or taking your medications?: No  Follow up appointments  reviewed: PCP Follow-up appointment confirmed?: No (declined, patient can't walk) Specialist Hospital Follow-up appointment confirmed?: NA Do you need transportation to your follow-up appointment?: No Do you understand care options if your condition(s) worsen?: Yes-patient verbalized understanding    SIGNATURE Karena Addison, LPN [facility] Nurse Health Advisor Direct Dial [phone]

## 2022-07-12 ENCOUNTER — Other Ambulatory Visit: Payer: Self-pay

## 2022-07-12 ENCOUNTER — Emergency Department (HOSPITAL_COMMUNITY): Payer: BC Managed Care – PPO

## 2022-07-12 ENCOUNTER — Emergency Department (HOSPITAL_BASED_OUTPATIENT_CLINIC_OR_DEPARTMENT_OTHER): Payer: BC Managed Care – PPO

## 2022-07-12 ENCOUNTER — Emergency Department (HOSPITAL_COMMUNITY)
Admission: EM | Admit: 2022-07-12 | Discharge: 2022-07-12 | Disposition: A | Discharge status: Home / Self Care | Payer: BC Managed Care – PPO | Attending: Emergency Medicine | Admitting: Emergency Medicine

## 2022-07-12 ENCOUNTER — Encounter (HOSPITAL_COMMUNITY): Payer: Self-pay

## 2022-07-12 DIAGNOSIS — R1031 Right lower quadrant pain: Secondary | ICD-10-CM | POA: Diagnosis not present

## 2022-07-12 DIAGNOSIS — Z91040 Latex allergy status: Secondary | ICD-10-CM | POA: Insufficient documentation

## 2022-07-12 DIAGNOSIS — M79605 Pain in left leg: Secondary | ICD-10-CM | POA: Diagnosis present

## 2022-07-12 DIAGNOSIS — M79662 Pain in left lower leg: Secondary | ICD-10-CM

## 2022-07-12 DIAGNOSIS — E1122 Type 2 diabetes mellitus with diabetic chronic kidney disease: Secondary | ICD-10-CM | POA: Diagnosis not present

## 2022-07-12 DIAGNOSIS — I129 Hypertensive chronic kidney disease with stage 1 through stage 4 chronic kidney disease, or unspecified chronic kidney disease: Secondary | ICD-10-CM | POA: Insufficient documentation

## 2022-07-12 DIAGNOSIS — Z79899 Other long term (current) drug therapy: Secondary | ICD-10-CM | POA: Diagnosis not present

## 2022-07-12 DIAGNOSIS — M5432 Sciatica, left side: Secondary | ICD-10-CM | POA: Insufficient documentation

## 2022-07-12 DIAGNOSIS — N189 Chronic kidney disease, unspecified: Secondary | ICD-10-CM | POA: Diagnosis not present

## 2022-07-12 DIAGNOSIS — Z794 Long term (current) use of insulin: Secondary | ICD-10-CM | POA: Diagnosis not present

## 2022-07-12 LAB — CBC WITH DIFFERENTIAL/PLATELET
Abs Immature Granulocytes: 0.02 10*3/uL (ref 0.00–0.07)
Basophils Absolute: 0 10*3/uL (ref 0.0–0.1)
Basophils Relative: 1 %
Eosinophils Absolute: 0.1 10*3/uL (ref 0.0–0.5)
Eosinophils Relative: 1 %
HCT: 42.4 % (ref 36.0–46.0)
Hemoglobin: 13.3 g/dL (ref 12.0–15.0)
Immature Granulocytes: 0 %
Lymphocytes Relative: 42 %
Lymphs Abs: 3.5 10*3/uL (ref 0.7–4.0)
MCH: 26.8 pg (ref 26.0–34.0)
MCHC: 31.4 g/dL (ref 30.0–36.0)
MCV: 85.3 fL (ref 80.0–100.0)
Monocytes Absolute: 0.7 10*3/uL (ref 0.1–1.0)
Monocytes Relative: 9 %
Neutro Abs: 4 10*3/uL (ref 1.7–7.7)
Neutrophils Relative %: 47 %
Platelets: 450 10*3/uL — ABNORMAL HIGH (ref 150–400)
RBC: 4.97 MIL/uL (ref 3.87–5.11)
RDW: 13.8 % (ref 11.5–15.5)
WBC: 8.4 10*3/uL (ref 4.0–10.5)
nRBC: 0 % (ref 0.0–0.2)

## 2022-07-12 LAB — URINALYSIS, ROUTINE W REFLEX MICROSCOPIC
Bilirubin Urine: NEGATIVE
Glucose, UA: NEGATIVE mg/dL
Ketones, ur: 5 mg/dL — AB
Leukocytes,Ua: NEGATIVE
Nitrite: NEGATIVE
Protein, ur: 30 mg/dL — AB
Specific Gravity, Urine: >1.046 — ABNORMAL HIGH (ref 1.005–1.030)
pH: 5 (ref 5.0–8.0)

## 2022-07-12 LAB — COMPREHENSIVE METABOLIC PANEL
ALT: 25 U/L (ref 0–44)
AST: 29 U/L (ref 15–41)
Albumin: 3.7 g/dL (ref 3.5–5.0)
Alkaline Phosphatase: 54 U/L (ref 38–126)
Anion gap: 12 (ref 5–15)
BUN: 23 mg/dL — ABNORMAL HIGH (ref 6–20)
CO2: 21 mmol/L — ABNORMAL LOW (ref 22–32)
Calcium: 9.3 mg/dL (ref 8.9–10.3)
Chloride: 102 mmol/L (ref 98–111)
Creatinine, Ser: 1.64 mg/dL — ABNORMAL HIGH (ref 0.44–1.00)
GFR, Estimated: 37 mL/min — ABNORMAL LOW (ref >60)
Glucose, Bld: 210 mg/dL — ABNORMAL HIGH (ref 70–99)
Potassium: 5.6 mmol/L — ABNORMAL HIGH (ref 3.5–5.1)
Sodium: 135 mmol/L (ref 135–145)
Total Bilirubin: 1 mg/dL (ref 0.3–1.2)
Total Protein: 7.4 g/dL (ref 6.5–8.1)

## 2022-07-12 LAB — LIPASE, BLOOD: Lipase: 38 U/L (ref 11–51)

## 2022-07-12 LAB — I-STAT BETA HCG BLOOD, ED (MC, WL, AP ONLY): I-stat hCG, quantitative: <5 m[IU]/mL (ref <5)

## 2022-07-12 LAB — BRAIN NATRIURETIC PEPTIDE: B Natriuretic Peptide: 29.7 pg/mL (ref 0.0–100.0)

## 2022-07-12 LAB — POTASSIUM: Potassium: 4.5 mmol/L (ref 3.5–5.1)

## 2022-07-12 MED ORDER — OXYCODONE HCL 5 MG PO TABS: 5.0000 mg | ORAL_TABLET | ORAL | 0 refills | Status: DC | PRN

## 2022-07-12 MED ORDER — HYDROMORPHONE HCL 1 MG/ML IJ SOLN
0.5000 mg | Freq: Once | INTRAMUSCULAR | Status: AC
  Administered 2022-07-12: 0.5 mg via INTRAVENOUS
  Filled 2022-07-12: qty 1

## 2022-07-12 MED ORDER — IOHEXOL 350 MG/ML SOLN
100.0000 mL | Freq: Once | INTRAVENOUS | Status: AC | PRN
  Administered 2022-07-12: 100 mL via INTRAVENOUS

## 2022-07-12 MED ORDER — ONDANSETRON HCL 4 MG/2ML IJ SOLN
4.0000 mg | Freq: Once | INTRAMUSCULAR | Status: AC
  Administered 2022-07-12: 4 mg via INTRAVENOUS
  Filled 2022-07-12: qty 2

## 2022-07-12 MED ORDER — MORPHINE SULFATE (PF) 4 MG/ML IV SOLN
4.0000 mg | Freq: Once | INTRAVENOUS | Status: AC
  Administered 2022-07-12: 4 mg via INTRAVENOUS
  Filled 2022-07-12: qty 1

## 2022-07-12 MED ORDER — PREDNISONE 10 MG (21) PO TBPK: ORAL_TABLET | Freq: Every day | ORAL | 0 refills | Status: DC

## 2022-07-12 MED ORDER — LIDOCAINE 5 % EX PTCH: 1.0000 | MEDICATED_PATCH | CUTANEOUS | 0 refills | Status: DC

## 2022-07-12 NOTE — ED Triage Notes (Signed)
Pt arrived POV from home c/o back pain, left leg pain, body aches and chills x several days. Pt also states she has sciatica and it is killing her. Pt states she was here a couple days ago and we did nothing for her.

## 2022-07-12 NOTE — Progress Notes (Signed)
VASCULAR LAB    Left lower extremity venous duplex has been performed.  See CV proc for preliminary results.  Gave verbal report to [employer], PA-C  Nusaybah Ivie, RVT 07/12/2022, 6:07 PM

## 2022-07-12 NOTE — Discharge Instructions (Addendum)
It was a pleasure taking care of you here in the emergency department  I have sent in a short course of pain medicine to your pharmacy so you may pick it up tonight.  Make sure to follow-up with your primary care provider on Monday or Tuesday let them know you are seen here again in the emergency department.  They can help you follow-up with pain management specialist that they typically work with.  Return for new or worsening symptoms

## 2022-07-12 NOTE — ED Provider Notes (Signed)
[place] EMERGENCY DEPARTMENT AT [facility] Provider Note   CSN: [id number] Arrival date & time: 07/12/22  1334    History  Chief Complaint  Patient presents with   Back Pain   Leg Pain    [name] is a 52 y.o. female history of type 2 diabetes, hypertension, obesity, CKD here for evaluation of pain.  Patient states she was admitted about a week ago for back pain going to her left leg. Diagnosed with sciatica.  Had MRI at that time was told she had a "bulging disc."  She unfortunately left AMA prior to completion. Did not complete PT while inpatient. She was not mobile at dc.  Apparently neurosurgery saw patient and deemed her not a surgical candidate and recommended outpatient pain management. Patient states she continues to have significant difficulty with ambulation.  Initially was able to use a cane however has not been able to take care of herself at home, unable to bathe secondary to pain.  States when she left she was sent in pain medicine however she has not been able to pick this up as she cannot drive a car due to her pain.  No bowel or bladder incontinence, saddle paresthesias, malignancy.  States over last 24 hours she is not having pain to her right lower abdomen into her groin, worse with movement.  Some dysuria just prior to arrival denies any hematuria, history of kidney stones.  She rates her pain a 10/10.  She does have some pain to her posterior left calf which has been present for weeks. No recent US, hc of DVT/ PE.  No numbness or weakness.  No pain in extremities.  She is very tearful when describing her pain as she states that she has been unable to work and if afraid of losing her employment/ insurance due to inability to work from her pain. Denies SI, HI, AVH.  HPI     Home Medications Prior to Admission medications   Medication Sig Start Date End Date Taking? Authorizing Provider  lidocaine (LIDODERM) 5 % Place 1 patch onto the skin daily. Remove &  Discard patch within 12 hours or as directed by MD 07/12/22  Yes Lailanie Hasley A, PA-C  oxyCODONE (ROXICODONE) 5 MG immediate release tablet Take 1 tablet (5 mg total) by mouth every 4 (four) hours as needed for severe pain. 07/12/22  Yes Javayah Magaw A, PA-C  predniSONE (STERAPRED UNI-PAK 21 TAB) 10 MG (21) TBPK tablet Take by mouth daily. Take 6 tabs by mouth daily  for 1 days, then 5 tabs for 1 days, then 4 tabs for 1 days, then 3 tabs for 1 days, 2 tabs for 1 days, then 1 tab by mouth daily for 1 days 07/12/22  Yes Haide Klinker A, PA-C  albuterol (VENTOLIN HFA) 108 (90 Base) MCG/ACT inhaler Inhale 2 puffs into the lungs every 6 (six) hours as needed for shortness of breath. 02/26/22   Clapacs, Jackquline Denmark, MD  blood glucose meter kit and supplies KIT Dispense based on patient and insurance preference. Use up to four times daily as directed. 10/01/21   Sabino Dick, DO  cyclobenzaprine (FLEXERIL) 10 MG tablet Take 1 tablet (10 mg total) by mouth 3 (three) times daily as needed for muscle spasms. 06/29/22   Roxy Horseman, PA-C  FLUoxetine (PROZAC) 40 MG capsule Take 2 capsules (80 mg total) by mouth daily. 05/21/22   Shanna Cisco, NP  fluticasone (FLONASE) 50 MCG/ACT nasal spray Place 1 spray  into both nostrils daily. 1 spray in each nostril every day 06/10/21   Sabino Dick, DO  fluticasone-salmeterol (WIXELA INHUB) 250-50 MCG/ACT AEPB Inhale 1 puff into the lungs in the morning and at bedtime. 04/22/22   Sabino Dick, DO  furosemide (LASIX) 40 MG tablet Take 1 tablet (40 mg total) by mouth daily. 02/26/22   Clapacs, Jackquline Denmark, MD  gabapentin (NEURONTIN) 100 MG capsule Take 2 capsules (200 mg total) by mouth 3 (three) times daily. 07/09/22   Sabino Dick, DO  hydrOXYzine (ATARAX) 50 MG tablet Take 1 tablet (50 mg total) by mouth 3 (three) times daily as needed for anxiety. 05/21/22   Shanna Cisco, NP  insulin glargine (LANTUS) 100 UNIT/ML Solostar Pen Inject 65 Units  into the skin 2 (two) times daily. 04/22/22   Sabino Dick, DO  Insulin Pen Needle 32G X 4 MM MISC 1 each by Does not apply route 3 (three) times daily. 04/23/22   Sabino Dick, DO  isosorbide-hydrALAZINE (BIDIL) 20-37.5 MG tablet Take 1 tablet by mouth 3 (three) times daily. 02/26/22   Clapacs, Jackquline Denmark, MD  levothyroxine (SYNTHROID) 300 MCG tablet Take 1 tablet (300 mcg total) by mouth daily at 6 (six) AM. 04/22/22   Sabino Dick, DO  LORazepam (ATIVAN) 1 MG tablet Take 1 tablet by mouth twice daily as needed for anxiety 06/13/22   Sabino Dick, DO  metoprolol tartrate (LOPRESSOR) 25 MG tablet Take 1 tablet (25 mg total) by mouth 2 (two) times daily. 02/26/22   Clapacs, Jackquline Denmark, MD  mirtazapine (REMERON) 15 MG tablet Take 2 tablets (30 mg total) by mouth at bedtime. 07/03/22   Shanna Cisco, NP  NOVOLOG FLEXPEN 100 UNIT/ML FlexPen INJECT 15 UNITS SUBCUTANEOUSLY WITH LUNCH AND INJECT 20 UNITS SUBCUTANEOUSLY WITH SUPPER 07/07/22   Sabino Dick, DO  rosuvastatin (CRESTOR) 20 MG tablet Take 1 tablet (20 mg total) by mouth daily. 02/26/22   Clapacs, Jackquline Denmark, MD  Semaglutide, 2 MG/DOSE, (OZEMPIC, 2 MG/DOSE,) 8 MG/3ML SOPN Inject 2 mg into the skin once per week. 04/24/22   Sabino Dick, DO  ipratropium-albuterol (DUONEB) 0.5-2.5 (3) MG/3ML SOLN Take 3 mLs by nebulization 3 (three) times daily as needed. Patient taking differently: Take 3 mLs by nebulization 3 (three) times daily as needed (sob/wheezing). 10/12/20 05/21/21  Fayrene Helper, PA-C      Allergies    Ace inhibitors, Haldol [haloperidol lactate], Nsaids, Entresto [sacubitril-valsartan], and Latex    Review of Systems   Review of Systems  Constitutional:  Positive for activity change and chills. Negative for appetite change.  HENT: Negative.    Respiratory: Negative.    Cardiovascular: Negative.   Gastrointestinal:  Positive for abdominal pain and nausea. Negative for abdominal distention, anal bleeding,  blood in stool, constipation, diarrhea, rectal pain and vomiting.  Genitourinary:  Positive for dysuria. Negative for decreased urine volume, difficulty urinating, dyspareunia, flank pain, frequency, genital sores, hematuria, menstrual problem, pelvic pain, urgency, vaginal bleeding, vaginal discharge and vaginal pain.  Musculoskeletal:  Positive for back pain. Negative for neck pain and neck stiffness.  Skin: Negative.   Neurological: Negative.   All other systems reviewed and are negative.  Physical Exam Updated Vital Signs BP 129/80 (BP Location: Right Arm)   Pulse (!) 49   Temp 98.9 F (37.2 C) (Oral)   Resp 17   Ht 5\' 11"  (1.803 m)   Wt (!) 157.9 kg   LMP 06/23/2022 (Exact Date)   SpO2 98%   BMI 48.54 kg/m  Physical Exam Vitals and nursing note reviewed.  Constitutional:      General: She is not in acute distress.    Appearance: She is well-developed. She is obese. She is not ill-appearing, toxic-appearing or diaphoretic.     Comments: Tearful  HENT:     Head: Normocephalic and atraumatic.     Mouth/Throat:     Mouth: Mucous membranes are moist.  Eyes:     Pupils: Pupils are equal, round, and reactive to light.  Cardiovascular:     Rate and Rhythm: Normal rate.     Pulses: Normal pulses.          Dorsalis pedis pulses are 2+ on the right side and 2+ on the left side.       Posterior tibial pulses are 2+ on the right side and 2+ on the left side.     Heart sounds: Normal heart sounds.  Pulmonary:     Effort: Pulmonary effort is normal. No respiratory distress.     Breath sounds: Normal breath sounds and air entry.     Comments: Clear bilaterally, speaks in full sentences without difficulty Abdominal:     General: Bowel sounds are normal. There is no distension.     Palpations: Abdomen is soft.     Tenderness: There is abdominal tenderness in the right lower quadrant.     Hernia: No hernia is present.     Comments: Soft, mild tenderness right lower quadrant into  right groin.  No erythema, warmth.  No fluctuance or induration.  No obvious hernias.  Musculoskeletal:        General: Normal range of motion.     Cervical back: Normal range of motion.     Comments: Unable to straight leg raise on left secondary to pain.  Bends at bilateral knees without difficulty.  Wiggles toes.  Compartments soft.  Mild tenderness lateral aspect left calf no fluctuance or induration.  Erythema.  Skin:    General: Skin is warm and dry.  Neurological:     General: No focal deficit present.     Mental Status: She is alert.     Cranial Nerves: Cranial nerves 2-12 are intact.     Sensory: Sensation is intact.     Motor: Weakness present.     Comments: Weakness left lower extremity likely secondary to pain Unable to ambulate secondary to pain  Psychiatric:        Mood and Affect: Mood normal.     ED Results / Procedures / Treatments   Labs (all labs ordered are listed, but only abnormal results are displayed) Labs Reviewed  CBC WITH DIFFERENTIAL/PLATELET - Abnormal; Notable for the following components:      Result Value   Platelets 450 (*)    All other components within normal limits  COMPREHENSIVE METABOLIC PANEL - Abnormal; Notable for the following components:   Potassium 5.6 (*)    CO2 21 (*)    Glucose, Bld 210 (*)    BUN 23 (*)    Creatinine, Ser 1.64 (*)    GFR, Estimated 37 (*)    All other components within normal limits  URINALYSIS, ROUTINE W REFLEX MICROSCOPIC - Abnormal; Notable for the following components:   APPearance HAZY (*)    Specific Gravity, Urine >1.046 (*)    Hgb urine dipstick SMALL (*)    Ketones, ur 5 (*)    Protein, ur 30 (*)    Bacteria, UA RARE (*)    All other components within  normal limits  LIPASE, BLOOD  BRAIN NATRIURETIC PEPTIDE  POTASSIUM  I-STAT BETA HCG BLOOD, ED ([facility], [facility], [facility] ONLY)    EKG None  Radiology CT ABDOMEN PELVIS W CONTRAST  Result Date: 07/12/2022 CLINICAL DATA:  Right lower quadrant abdominal pain  EXAM: CT ABDOMEN AND PELVIS WITH CONTRAST TECHNIQUE: Multidetector CT imaging of the abdomen and pelvis was performed using the standard protocol following bolus administration of intravenous contrast. RADIATION DOSE REDUCTION: This exam was performed according to the departmental dose-optimization program which includes automated exposure control, adjustment of the mA and/or kV according to patient size and/or use of iterative reconstruction technique. CONTRAST:  OMNIPAQUE IOHEXOL 350 MG/ML SOLN COMPARISON:  02/22/2022 FINDINGS: Lower chest: No acute pleural or parenchymal lung disease. Hepatobiliary: No focal liver abnormality is seen. Status post cholecystectomy. No biliary dilatation. Pancreas: Unremarkable. No pancreatic ductal dilatation or surrounding inflammatory changes. Spleen: Normal in size without focal abnormality. Adrenals/Urinary Tract: Adrenal glands are unremarkable. Kidneys are normal, without renal calculi, focal lesion, or hydronephrosis. Bladder is unremarkable. Stomach/Bowel: No bowel obstruction or ileus. Normal appendix right lower quadrant. No bowel wall thickening or inflammatory change. Vascular/Lymphatic: No significant vascular findings are present. No enlarged abdominal or pelvic lymph nodes. Reproductive: Heterogeneous enlargement of the uterus consistent with multiple fibroids, stable. No adnexal masses. Other: No free fluid or free intraperitoneal gas. No abdominal wall hernia. Musculoskeletal: No acute or destructive bony lesions. Unilateral right pars defect at L4 without spondylolisthesis. Severe spondylosis and facet hypertrophy at L4-5 results in at least moderate central canal stenosis. Reconstructed images demonstrate no additional findings. IMPRESSION: 1. No acute intra-abdominal or intrapelvic process. Normal appendix. 2. Fibroid uterus. 3. Severe progressive degenerative changes at L4-5, with unilateral right L4 pars defect. No evidence of spondylolisthesis.  Electronically Signed   By: Sharlet Salina M.D.   On: 07/12/2022 18:43   VAS [name] LOWER EXTREMITY VENOUS (DVT) (ONLY [facility] & [facility])  Result Date: 07/12/2022  Lower Venous DVT Study Patient Name:  HANNAHA WIESS  Date of Exam:   07/12/2022 Medical Rec #: [id number]        Accession #:    [id number] Date of Birth: August 23, 1970         Patient Gender: F Patient Age:   4 years Exam Location:  [facility] Procedure:      VAS [name] LOWER EXTREMITY VENOUS (DVT) Referring Phys: Zelina Jimerson --------------------------------------------------------------------------------  Indications: Left lateral pain (sciatica).  Limitations: Body habitus. Comparison Study: Prior negative left LEV Performing Technologist: Sherren Kerns RVS  Examination Guidelines: A complete evaluation includes B-mode imaging, spectral Doppler, color Doppler, and power Doppler as needed of all accessible portions of each vessel. Bilateral testing is considered an integral part of a complete examination. Limited examinations for reoccurring indications may be performed as noted. The reflux portion of the exam is performed with the patient in reverse Trendelenburg.  +-----+---------------+---------+-----------+----------+--------------+ RIGHTCompressibilityPhasicitySpontaneityPropertiesThrombus Aging +-----+---------------+---------+-----------+----------+--------------+ CFV  Full           Yes      Yes                                 +-----+---------------+---------+-----------+----------+--------------+   +---------+---------------+---------+-----------+----------+-------------------+ LEFT     CompressibilityPhasicitySpontaneityPropertiesThrombus Aging      +---------+---------------+---------+-----------+----------+-------------------+ CFV      Full           Yes      Yes                                      +---------+---------------+---------+-----------+----------+-------------------+  SFJ      Full                                                              +---------+---------------+---------+-----------+----------+-------------------+ FV Prox  Full                                                             +---------+---------------+---------+-----------+----------+-------------------+ FV Mid   Full           Yes      Yes                                      +---------+---------------+---------+-----------+----------+-------------------+ FV DistalFull                                                             +---------+---------------+---------+-----------+----------+-------------------+ PFV      Full                                                             +---------+---------------+---------+-----------+----------+-------------------+ POP      Full           Yes      Yes                                      +---------+---------------+---------+-----------+----------+-------------------+ PTV      Full                                                             +---------+---------------+---------+-----------+----------+-------------------+ PERO                                                  Not well visualized +---------+---------------+---------+-----------+----------+-------------------+    Summary: RIGHT: - No evidence of common femoral vein obstruction.  LEFT: - There is no evidence of deep vein thrombosis in the lower extremity. However, portions of this examination were limited- see technologist comments above.  *See table(s) above for measurements and observations.    Preliminary     Procedures Procedures    Medications Ordered in ED Medications  ondansetron (ZOFRAN) injection 4 mg (4 mg Intravenous Given 07/12/22 1642)  morphine (PF) 4 MG/ML injection 4 mg (4 mg Intravenous Given 07/12/22 1643)  iohexol (OMNIPAQUE) 350 MG/ML injection 100 mL (100 mLs Intravenous Contrast Given 07/12/22 1819)  HYDROmorphone (DILAUDID) injection 0.5 mg (0.5 mg Intravenous Given  07/12/22 2004)    ED Course/ Medical Decision Making/ A&P    52 year old multiple medical comorbidities here for evaluation of pain.  Recent admission for sciatica pain management, unfortunately left AGAINST MEDICAL ADVICE 3 days ago.  While she was inpatient was evaluated by neurosurgery who deemed her not to be a surgical candidate due to her comorbidities and recommend outpatient injections with pain management.  She comes in today for persistent pain starting in her back and going down her left leg.  She is also now having pain in her right lower abdomen into her right groin who does not extend into her legs.  No bowel or bladder incontinence, saddle paresthesia, fever, history of IVDU, malignancy.  Leaving the hospital she was nonambulatory and she still not ambulatory secondary to pain subsequently has not been able to pick up her prescriptions at work.  She is very tearful in the room as she is fearful she will lose her job/insurance due to inability to work.  Pain is not necessarily worsened however it is chronic and debilitating.  Heart and lungs are clear.  She is neurovascularly intact.  Equal pulses bilaterally.  Limited neurologic exam secondary to pain to her left lower extremity.  Does have some tenderness to her left posterior lateral aspect calf, no history of DVT or PE.  She has no chest pain or shortness of breath.  She has no obvious infectious process to suggest cellulitis or abscess.  Low suspicion for neck Fash, Fournier's gangrene as cause of her pelvic pain.  Will plan on labs and imaging however suspect her right lower abdomen/groin pain is likely due to her attempting to ambulate using only her right side due to increased pain with ambulating with her left leg.  She has no clinical evidence of VTE, ischemia on exam.  She had MRI during prior hospitalization which not show any red flag signs.  Do not feel we need to reimage her back at this time.  Meantime pain management  Labs and  imaging personally viewed and interpreted:  CBC without leukocytosis CMP potassium 5.6, creatinine 1.64 some hemolysis Lipase 38 Preg neg BNP 29 UA neg for infection US DVT neg Repeat potassium 4.5 CT abdomen pelvis without acute abnormality, shows severe degenerative changes in L-spine consistent with her MRI last week  Patient's pain manage, up with cane at bedside.  I discussed admission for pain management, PT however patient declines.  Prefers to follow-up outpatient.  She does have a prescription for pain management at normal pharmacy however will write for some Rx for this evening.  Ultimately I discussed with her following up with pain management as well as PCP.    Low suspicion for cauda equina, discitis, osteomyelitis, transverse myelitis, psoas abscess, AAA, dissection, DVT, ischemia, fracture as cause of her symptoms.  DC home in stable condition.  The patient has been appropriately medically screened and/or stabilized in the ED. I have low suspicion for any other emergent medical condition which would require further screening, evaluation or treatment in the ED or require inpatient management.  Patient is hemodynamically stable and in no acute distress.  Patient able to ambulate in department prior to ED.  Evaluation does not show acute pathology that would require ongoing or additional emergent interventions while in the emergency department or further inpatient treatment.  I have discussed the diagnosis  with the patient and answered all questions.  Pain is been managed while in the emergency department and patient has no further complaints prior to discharge.  Patient is comfortable with plan discussed in room and is stable for discharge at this time.  I have discussed strict return precautions for returning to the emergency department.  Patient was encouraged to follow-up with PCP/specialist refer to at discharge.                             Medical Decision Making Amount  and/or Complexity of Data Reviewed External Data Reviewed: labs, radiology and notes. Labs: ordered. Decision-making details documented in ED Course. Radiology: ordered and independent interpretation performed. Decision-making details documented in ED Course.  Risk OTC drugs. Prescription drug management. Parenteral controlled substances. Decision regarding hospitalization. Diagnosis or treatment significantly limited by social determinants of health.          Final Clinical Impression(s) / ED Diagnoses Final diagnoses:  Sciatica of left side    Rx / DC Orders ED Discharge Orders          Ordered    oxyCODONE (ROXICODONE) 5 MG immediate release tablet  Every 4 hours PRN        07/12/22 2131    lidocaine (LIDODERM) 5 %  Every 24 hours        07/12/22 2131    predniSONE (STERAPRED UNI-PAK 21 TAB) 10 MG (21) TBPK tablet  Daily        07/12/22 2131              Camil Hausmann A, PA-C 07/12/22 2133    Loetta Rough, MD 07/14/22 [phone]

## 2022-07-14 ENCOUNTER — Other Ambulatory Visit: Payer: Self-pay | Admitting: Family Medicine

## 2022-07-14 ENCOUNTER — Encounter: Payer: Self-pay | Admitting: Student

## 2022-07-14 ENCOUNTER — Ambulatory Visit (INDEPENDENT_AMBULATORY_CARE_PROVIDER_SITE_OTHER): Payer: BC Managed Care – PPO | Admitting: Student

## 2022-07-14 ENCOUNTER — Telehealth: Payer: Self-pay | Admitting: Family Medicine

## 2022-07-14 VITALS — BP 122/99 | HR 115 | Ht 71.0 in | Wt 343.8 lb

## 2022-07-14 DIAGNOSIS — M5442 Lumbago with sciatica, left side: Secondary | ICD-10-CM | POA: Diagnosis not present

## 2022-07-14 DIAGNOSIS — M5432 Sciatica, left side: Secondary | ICD-10-CM

## 2022-07-14 NOTE — Telephone Encounter (Signed)
Patient dropped off form at front desk for [facility].  Verified that patient section of form has been completed.  Last DOS/WCC with PCP was 07/14/22  Placed form in red team folder to be completed by clinical staff.  Vilinda Blanks

## 2022-07-14 NOTE — Progress Notes (Signed)
    SUBJECTIVE:   CHIEF COMPLAINT / HPI:   Patient is a 52 year old female with history of sciatica and osteoarthritis on chronic oxycodone presenting for hospital follow-up for severe sciatica.  Was recently admitted to the hospital for significant back pain and significant sciatica affecting ambulation.  MRI at that time showed bilateral L4-L5 facets effusion and patient was treated with steroid and analgesic for pain control.  Per discharge summary patient declined PT and neurosurgery was consulted who deemed patient not a good candidate for surgery given body habitus and comorbidities.  Medication include Percocet 5 mg every 4 hours and gabapentin 200 mg 3 times daily however patient has not been taking her meds but plans to pick them up from the pharmacy today.  So far she has used heating pad which has provided mild relief.  Patient stated her lower back pain and lateral feet paresthesia has limited her ambulation and is affecting her daily life which has limited her ability to work.  She currently has PT appointment scheduled for 07/30/2022.  PERTINENT  PMH / PSH: Reviewed  OBJECTIVE:   BP (!) 122/99   Pulse (!) 115   Ht 5\' 11"  (1.803 m)   Wt (!) 343 lb 12.8 oz (155.9 kg)   LMP 06/23/2022 (Exact Date)   SpO2 100%   BMI 47.95 kg/m    Physical Exam General: Alert, morbidly obese, NAD Cardiovascular: RRR, No Murmurs, Normal S2/S2 Respiratory: CTAB, No wheezing or Rales Abdomen: No distension or tenderness Extremities: No edema on extremities   Neuro: Alert and oriented x 3, bilateral lower extremity strength 5/5 creased sensation on the left foot with pain on palpation of the left shin.  Patient currently ambulating with cane  ASSESSMENT/PLAN:   Acute left-sided low back pain with left-sided sciatica Symptoms consistent with worsening sciatic likely secondary to L4-L5 facet seen on MRI at her most recent hospitalization.  Pain is significant and affecting her ADL. No Red flag  symptoms such as a Cauda equine or bowel/urinary incontinence.  She likely will benefit from intra facets injection and physical therapy.  FMLA form to be completed by PCP. -Reach patient to follow-up with her PT appointment on 07/30/2022 -Placed referral for IR for intra facet injections -Advised patient to take pain medication and gabapentin as prescribed -Continue heating pad -Reviewed return precautions with patient.    Of note called patient to inform her that I have changed her referral from pain management to IR for her facet injections.  She was amendable to change.  Jerre Simon, MD [facility]

## 2022-07-14 NOTE — Assessment & Plan Note (Signed)
Symptoms consistent with worsening sciatic likely secondary to L4-L5 facet seen on MRI at her most recent hospitalization.  Pain is significant and affecting her ADL. No Red flag symptoms such as a Cauda equine or bowel/urinary incontinence.  She likely will benefit from intra facets injection and physical therapy.  FMLA form to be completed by PCP. -Reach patient to follow-up with her PT appointment on 07/30/2022 -Placed referral for IR for intra facet injections -Advised patient to take pain medication and gabapentin as prescribed -Continue heating pad -Reviewed return precautions with patient.

## 2022-07-14 NOTE — Patient Instructions (Signed)
It was wonderful to see you today. Thank you for allowing me to be a part of your care. Below is a short summary of what we discussed at your visit today:  I have placed in referral with the pain clinic to consider getting intra facet injections.  Continue use of your oxycodone and gabapentin for pain management.  I also recommend continued use of heating pad.  Please follow-up with your physical therapy appointment and to hopefully strengthen your back muscles as this provide some relief.  Please bring all of your medications to every appointment!  If you have any questions or concerns, please do not hesitate to contact us via phone or MyChart message.   Jerre Simon, MD [facility]

## 2022-07-15 NOTE — Telephone Encounter (Signed)
Form has been placed in your box for completion, please finish whenever you can thank you! Penni Bombard CMA

## 2022-07-16 NOTE — Telephone Encounter (Signed)
FMLA form is completed and ready for patient to pick up.   Thank you! Ale

## 2022-07-17 NOTE — Telephone Encounter (Signed)
Patient advised FMLA is ready for pick up.   Patient asks we hold them until the "other two pages are complete." She reports she was just informed by HR two more pages need to be completed. She reports her son is dropping them off today.   Will hold in RN room.

## 2022-07-18 ENCOUNTER — Telehealth: Payer: Self-pay

## 2022-07-18 NOTE — Telephone Encounter (Signed)
Patient calls nurse line regarding appointment for Interfacet injections at [facility].   Patient reports that she has attempted to call multiple times, however, unable to reach anyone to schedule.   Called [facility]. Left message with Cathey in Spine Department to return call to office in regards to scheduling this procedure.   Called patient and provided with update. Patient appreciative.   Veronda Prude, RN

## 2022-07-21 ENCOUNTER — Telehealth: Payer: Self-pay | Admitting: Family Medicine

## 2022-07-21 ENCOUNTER — Encounter: Payer: Self-pay | Admitting: Family Medicine

## 2022-07-21 ENCOUNTER — Ambulatory Visit: Payer: BC Managed Care – PPO | Admitting: Family Medicine

## 2022-07-21 NOTE — Telephone Encounter (Signed)
Patient's son dropped off FMLA paperwork to be completed. Last DOS was 07/14/22. Placed in [employer].

## 2022-07-22 NOTE — Telephone Encounter (Signed)
Forms are located in your box for completions, please do so whenever you have time. Thanks Penni Bombard CMA

## 2022-07-23 ENCOUNTER — Other Ambulatory Visit: Payer: Self-pay

## 2022-07-23 ENCOUNTER — Other Ambulatory Visit: Payer: Self-pay | Admitting: Family Medicine

## 2022-07-23 DIAGNOSIS — M5431 Sciatica, right side: Secondary | ICD-10-CM

## 2022-07-23 NOTE — Telephone Encounter (Signed)
FMLA forms already previously completed. Discussed with Page who will contact patient for pick up.

## 2022-07-23 NOTE — Telephone Encounter (Signed)
Patient calls nurse line requesting refills on pain medications.   She reports today is not a good today and she is in a lot of pain.   She is requesting muscle relaxer's and oxycodone.   Will forward to PCP.

## 2022-07-23 NOTE — Telephone Encounter (Signed)
Forms placed up front for pick up.  Copy made for batch scanning.   Patient reports her son will come by this morning to pick them up. Reminded her of afternoon closure.   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 She reports ~ 4 weeks left.   Patient advised to contact her HR department to see what needs to be done. Patient advised this will also need to be discussed with PCP at upcoming apt.

## 2022-07-24 ENCOUNTER — Ambulatory Visit
Admission: RE | Admit: 2022-07-24 | Discharge: 2022-07-24 | Disposition: A | Discharge status: Home / Self Care | Payer: BC Managed Care – PPO | Source: Ambulatory Visit | Attending: Family Medicine | Admitting: Family Medicine

## 2022-07-24 ENCOUNTER — Other Ambulatory Visit: Payer: Self-pay

## 2022-07-24 DIAGNOSIS — M5432 Sciatica, left side: Secondary | ICD-10-CM

## 2022-07-24 DIAGNOSIS — M5431 Sciatica, right side: Secondary | ICD-10-CM

## 2022-07-24 MED ORDER — CYCLOBENZAPRINE HCL 10 MG PO TABS: 10.0000 mg | ORAL_TABLET | Freq: Three times a day (TID) | ORAL | 0 refills | Status: DC | PRN

## 2022-07-24 MED ORDER — METHYLPREDNISOLONE ACETATE 40 MG/ML INJ SUSP (RADIOLOG
80.0000 mg | Freq: Once | INTRAMUSCULAR | Status: AC
  Administered 2022-07-24: 80 mg via INTRA_ARTICULAR

## 2022-07-24 MED ORDER — IOPAMIDOL (ISOVUE-M 200) INJECTION 41%
2.0000 mL | Freq: Once | INTRAMUSCULAR | Status: AC
  Administered 2022-07-24: 2 mL via INTRA_ARTICULAR

## 2022-07-24 NOTE — Discharge Instructions (Signed)

## 2022-07-24 NOTE — Progress Notes (Signed)
   [name] 03-27-1970 [id number]  Patient outreached by Bing Plume, PharmD Candidate on 07/24/2022 while they were picking up prescriptions at [facility].  Blood Pressure Readings: Last documented ambulatory systolic blood pressure: 151 (Patient's BP from last clinic visit. Patient states that BP may be elevated due to an injection she received during her last visit.) Last documented ambulatory diastolic blood pressure: 80 Does the patient have a validated home blood pressure machine?: No (Patient was provided information regarding how to obtain BP monitor. Told to call BCBS line to ask for coverage.)   Medication review was performed. Is the patient taking their medications as prescribed?: Yes   The following barriers to adherence were noted: Does the patient have cost concerns?: Yes Does the patient have transportation concerns?: No Does the patient need assistance obtaining refills?: Yes (Currently unemployed. Voiced concern on need for assistance in getting future refills.) Does the patient occassionally forget to take some of their prescribed medications?: No Does the patient feel like one/some of their medications make them feel poorly?: No Does the patient have questions or concerns about their medications?: No Does the patient have a follow up scheduled with their primary care provider/cardiologist?: Yes   Interventions: Interventions Completed: Medications were reviewed, Patient was educated on goal blood pressures and long term health implications of elevated blood pressure  The patient has follow up scheduled:  PCP: Sabino Dick, DO   Winnifred Friar, Student-PharmD

## 2022-07-29 ENCOUNTER — Ambulatory Visit (INDEPENDENT_AMBULATORY_CARE_PROVIDER_SITE_OTHER): Payer: BC Managed Care – PPO | Admitting: Family Medicine

## 2022-07-29 ENCOUNTER — Other Ambulatory Visit: Payer: Self-pay

## 2022-07-29 VITALS — BP 135/100 | HR 122 | Ht 71.0 in | Wt 344.0 lb

## 2022-07-29 DIAGNOSIS — E118 Type 2 diabetes mellitus with unspecified complications: Secondary | ICD-10-CM

## 2022-07-29 DIAGNOSIS — M2548 Effusion, other site: Secondary | ICD-10-CM

## 2022-07-29 MED ORDER — OZEMPIC (2 MG/DOSE) 8 MG/3ML ~~LOC~~ SOPN: PEN_INJECTOR | SUBCUTANEOUS | 2 refills | Status: DC

## 2022-07-29 MED ORDER — INSULIN GLARGINE 100 UNIT/ML SOLOSTAR PEN: 65.0000 [IU] | PEN_INJECTOR | Freq: Two times a day (BID) | SUBCUTANEOUS | 2 refills | Status: DC

## 2022-07-29 MED ORDER — NOVOLOG FLEXPEN 100 UNIT/ML ~~LOC~~ SOPN: PEN_INJECTOR | SUBCUTANEOUS | 2 refills | Status: DC

## 2022-07-29 NOTE — Progress Notes (Signed)
    SUBJECTIVE:   CHIEF COMPLAINT / HPI:   [name] is a 52 y.o. female who presents to the [facility] clinic today to discuss the following concerns:   F/u Sciatica, Back Pain Since her last visit she had bilateral facet injections to L4-5 on 5/16. She reports not feeling any improvement since. She has difficulty walking and sleeping. It is hard to shower. She has been unable to go to work. She reports she is starting PT tomorrow. She has run out of the Gabapentin 200 mg TID but reports it was not helping her symptoms.   Still has shooting pain down left leg.  She is tearful since she was the sole provider for her family. Also stressed due to abundance of medial bills and not being able to work at her full time or part time job. 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    PERTINENT  PMH / PSH: T2DM, BPD  OBJECTIVE:   BP (!) 135/100   Pulse (!) 122   Ht 5\' 11"  (1.803 m)   Wt (!) 344 lb (156 kg)   BMI 47.98 kg/m   Vitals:   07/29/22 1633 07/29/22 1635  BP: (!) 152/97 (!) 135/100  Pulse: (!) 122    General: Tearful, able to participate in exam Cardiac: RRR, no murmurs. Not tachycardic on my examination  Respiratory: CTAB, normal effort, No wheezes, rales or rhonchi Extremities: no edema MSK: Able to ambulate with walker. 4/5 strength b/l lower extremities in hip flexion, knee extension and flexion. Ttp lumbar spine midline  Neuro: alert, no obvious focal deficits. Decreased sensation to left foot compared to right   ASSESSMENT/PLAN:   1. Type II diabetes mellitus with complication (HCC) Due for repeat A1c in August.  At her request I have refilled her insulin as she is running out. - insulin glargine (LANTUS) 100 UNIT/ML Solostar Pen; Inject 65 Units into the skin 2 (two) times daily.  Dispense: 15 mL; Refill: 2 - NOVOLOG FLEXPEN 100 UNIT/ML FlexPen; INJECT 15 UNITS SUBCUTANEOUSLY WITH LUNCH AND INJECT 20 UNITS SUBCUTANEOUSLY WITH SUPPER   Dispense: 15 mL; Refill: 2  2. Effusion of lumbar facet joint She underwent bilateral facet joint injections on 5/16.  Fortunately, she is not feeling much relief.  She is not a surgical candidate due to her body habitus.  She did not have any relief with oxy or gabapentin.  She was previously on Lyrica and did not have any relief either.  She does have her first session with physical therapy tomorrow which I am hopeful will help her in her rehabilitation process. We discussed importance of movement and stretching, despite some discomfort with this. It has been a long road for her, unfortunately. Will do our best to manage her symptoms though may be difficult to have her be pain-free. Did discuss possibility of pain clinic referral, she would prefer to see how PT goes and then go from there.     Sabino Dick, DO [facility]

## 2022-07-29 NOTE — Patient Instructions (Addendum)
It was wonderful to see you today.  Please bring ALL of your medications with you to every visit.   Today we talked about:  Lets see how Physical Therapy goes.  If you change your mind and would like [name] to refer you to a Pain specialist, please let us know Continue stretching as able Some movement, although painful will be important in your recovery  I have refilled your Ozempic and insulin. You are due for a repeat A1c in August  Thank you for coming to your visit as scheduled. We have had a large "no-show" problem lately, and this significantly limits our ability to see and care for patients. As a friendly reminder- if you cannot make your appointment please call to cancel. We do have a no show policy for those who do not cancel within 24 hours. Our policy is that if you miss or fail to cancel an appointment within 24 hours, 3 times in a 65-month period, you may be dismissed from our clinic.   Thank you for choosing [facility].   Please call [phone] with any questions about today's appointment.  Please be sure to schedule follow up at the front  desk before you leave today.   Sabino Dick, DO PGY-3 Family Medicine

## 2022-07-29 NOTE — Therapy (Signed)
OUTPATIENT PHYSICAL THERAPY THORACOLUMBAR EVALUATION   Patient Name: [name] MRN: [id number] DOB:01-16-1971, 52 y.o., female Today's Date: 07/30/2022  END OF SESSION:  PT End of Session - 07/30/22 1501     Visit Number 1    Number of Visits 17    Date for PT Re-Evaluation 09/24/22    Authorization Type BCBS    PT Start Time 1502    PT Stop Time 1550    PT Time Calculation (min) 48 min    Activity Tolerance Patient limited by pain    Behavior During Therapy [facility] for tasks assessed/performed             Past Medical History:  Diagnosis Date   Acute renal failure (HCC) 05/27/10   hemodialysis for 6 weeks   Anxiety    Asthma    Depression    Diabetes mellitus    Hypertension    Rhabdomyolysis 06/06/10   after drug overdose   Suicide attempt by drug ingestion (HCC) 06/05/10   result rhabdomyolosis and ARF requrining dialysis    Past Surgical History:  Procedure Laterality Date   ANKLE ARTHROSCOPY Right 08/17/2015   Procedure: RIGHT ANKLE ARTHROSCOPY WITH SYNOVECTOMY AND LOOSE BODY EXCISION;  Surgeon: Marcene Corning, MD;  Location: [facility] OR;  Service: Orthopedics;  Laterality: Right;   CHOLECYSTECTOMY     TUBAL LIGATION  1998   Patient Active Problem List   Diagnosis Date Noted   Sciatica associated with disorder of lumbar spine 07/09/2022   Sciatica 07/08/2022   Acute left-sided low back pain with left-sided sciatica 07/08/2022   Stomach cramps 03/12/2022   Severe recurrent major depression without psychotic features (HCC) 02/24/2022   Hyperkalemia 02/23/2022   Suicide attempt (HCC) 02/22/2022   Acute-on-chronic kidney injury (HCC) 02/22/2022   Drug overdose 02/20/2022   Onychomycosis 10/01/2021   SVT (supraventricular tachycardia) 05/28/2021   Obesity hypoventilation syndrome (HCC) 05/28/2021   CHF (congestive heart failure) (HCC)    Multifocal pneumonia 04/25/2021   Severe sepsis (HCC)    Healthcare maintenance 04/15/2021   Asthma 12/18/2020   Vaginal  irritation 09/28/2020   Mild depression 08/30/2020   Arthritis of right ankle 07/25/2020   Right knee pain 06/21/2020   Sinus pain 03/01/2020   Bleeding nose 03/01/2020   MDD (major depressive disorder) 11/21/2019   Moderate episode of recurrent major depressive disorder (HCC) 09/22/2019   Generalized anxiety disorder 09/22/2019   Bipolar I disorder, most recent episode depressed (HCC) 09/22/2019   Bilateral knee pain 03/14/2019   Osteoarthritis 11/24/2018   Bipolar disorder (HCC) 04/29/2017   HLD (hyperlipidemia) 03/19/2017   Asthma exacerbation 03/14/2017   Weight gain 12/06/2016   MDD (major depressive disorder), recurrent severe, 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   Dyspnea 05/09/2015   HTN (hypertension)    Vitamin D deficiency 04/11/2015   Fatigue 04/10/2015   Non compliance w medication regimen 04/10/2015   Adjustment disorder with mixed anxiety and depressed mood    Cough 09/20/2014   Grief at loss of child 06/22/2014   Dysmenorrhea 11/25/2013   Suicidal ideation 05/27/2011   Chronic kidney disease (CKD) 11/06/2010   Abdominal pain 01/14/2010   ENDOMETRIAL HYPERPLASIA UNSPECIFIED 02/07/2008   MENORRHAGIA 01/03/2008   Allergic rhinitis 06/24/2007   Essential hypertension, benign 02/03/2007   Hypothyroidism 05/07/2006   T2DM (type 2 diabetes mellitus) (HCC) 05/07/2006   Morbid obesity (HCC) 05/07/2006   Iron deficiency anemia 05/07/2006   Anxiety 05/07/2006   Obsessive-compulsive disorder 05/07/2006    PCP: Sabino Dick, DO  REFERRING PROVIDER: Latrelle Dodrill, MD  REFERRING DIAG: [phone] (ICD-10-CM) - Left sided sciatica   Rationale for Evaluation and Treatment: Rehabilitation  THERAPY DIAG:  Low back pain with left-sided sciatica, unspecified back pain laterality, unspecified chronicity  Other abnormalities of gait and mobility  ONSET DATE: mid March 2024  SUBJECTIVE:                                                                                                                                                                                            SUBJECTIVE STATEMENT: Pt endorses history of R ankle pain and L knee pain. States over the past year she has lost 60lbs primarily through dieting. Notes that in march 2024 she began to develop "a crick" in her R hip which gradually moved to L hip, then began to develop sharp/shooting pains down LLE into toes. Pt has had extensive medical work up with imaging/lab work, states she has been diagnosed with sciatica. Now having to use an [facility] for ambulation, requiring extensive rest breaks with navigation around home. 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  Difficulty and increased time with ADLs/housework. Feels weak, off balance, and that her leg is asleep. Also has some numbness in R toes.   PERTINENT HISTORY:  HTN, CHF, SVT, asthma, DM2, CKD, OCD, depression/anxiety  PAIN:  Are you having pain: yes, 9/10 Location/description: low back LLE posteriorly/laterally into toes Best-worst over past week: 8-10000/10  - aggravating factors: lower body dressing, walking, standing <76min, transfers - Easing factors: lying down (on left side), heating pad, steroid    PRECAUTIONS: cardiac hx, psych-soc hx  WEIGHT BEARING RESTRICTIONS: No  FALLS:  Has patient fallen in last 6 months? No - "a lot of almost falls"   LIVING ENVIRONMENT: 3 level home, bed/bath on third floor, has to do a lot of stairs in home environment, rails Lives with husband and son   OCCUPATION: special needs teacher  PLOF: Independent - had some R ankle troubles but denies limitations  PATIENT GOALS: get off of cane   NEXT MD VISIT: TBD  OBJECTIVE:   DIAGNOSTIC FINDINGS:  07/06/22 lumbar MRI "IMPRESSION: Multilevel degenerate age, greatest at at L4-L5 where there are bilateral facet joint effusions with perifacet inflammatory change/arthropathy and mild bilateral foraminal  stenosis."  PATIENT SURVEYS:  FOTO 16 current, 42 predicted  SCREENING FOR RED FLAGS: Red flag questioning/screening reassuring, pt has had recent MRI (refer to EPIC for details)  COGNITION: Overall cognitive status: Within functional limits for tasks assessed     SENSATION: Light touch intact BLE although diminished L5 and S1 on LLE  MUSCLE LENGTH: NT  POSTURE: reduced lordosis, mild shift towards R  PALPATION: Deferred given time constraints  LUMBAR ROM:   AROM eval  Flexion 100% (seated, painless)  Extension 75% seated, painful with provocation of LLE symptoms  Right lateral flexion   Left lateral flexion   Right rotation 100% painless    Left rotation 100% painless   (Blank rows = not tested)  LOWER EXTREMITY ROM:     Active  Right eval Left eval  Hip flexion    Hip extension    Hip internal rotation    Hip external rotation    Knee extension    Knee flexion    (Blank rows = not tested) (Key: WFL = within functional limits not formally assessed, * = concordant pain, s = stiffness/stretching sensation, NT = not tested)  Comments:    LOWER EXTREMITY MMT:    MMT Right eval Left eval  Hip flexion    Hip abduction (modified sitting)    Hip internal rotation    Hip external rotation    Knee flexion    Knee extension    Ankle dorsiflexion     (Blank rows = not tested) (Key: WFL = within functional limits not formally assessed, * = concordant pain, s = stiffness/stretching sensation, NT = not tested)  Comments: deferred given symptom irritability  LUMBAR SPECIAL TESTS:  + slump test LLE, negative on R  FUNCTIONAL TESTS:  Sit to stand: increase in pain, heavy B UE support from surface, trunk lean to R, wide BOS, reduced fwd trunk lean  GAIT: Distance walked: within clinic Assistive device utilized: Single point cane Level of assistance: Modified independence Comments: antalgic gait LLE, reduced gait speed/cadence, wide BOS, limited trunk  rotation  TODAY'S TREATMENT:                                                                                                                              [facility] Adult PT Treatment:                                                DATE: 07/30/22 Therapeutic Exercise: Modified sciatic nerve glides LLE (heel/toe raises given symptom irritability w/ testing) x8 Seated trunk rotation BIL x8 HEP handout + education on safety and appropriate performance   PATIENT EDUCATION:  Education details: Pt education on PT impairments, prognosis, and POC. Informed consent. Rationale for interventions, safe/appropriate HEP performance Person educated: Patient Education method: Explanation, Demonstration, Tactile cues, Verbal cues, and Handouts Education comprehension: verbalized understanding, returned demonstration, verbal cues required, tactile cues required, and needs further education    HOME EXERCISE PROGRAM: Access Code: [email or web address] URL: [email or web address] Date: 07/30/2022 Prepared by: Fransisco Hertz  Exercises - Seated Heel Toe Raises  - 1 x daily - 7 x weekly - 3 sets - 8 reps -  Seated Trunk Rotation - Arms Crossed  - 1 x daily - 7 x weekly - 3 sets - 8 reps  ASSESSMENT:  CLINICAL IMPRESSION: Pt is a very pleasant 52 year old woman who arrives to PT evaluation on this date for low back pain. Of note, pt was admitted to [facility] for this issue from 07/08/22-07/09/22 and left AMA, returned to ED on 07/12/22; pt followed up with family medicine on 07/14/22 and 07/30/22, their notes indicate plan to proceed with PT (please refer to [facility] as appropriate).  ***Pt reports difficulty with *** due to pain. During today's session pt demonstrates *** which are limiting ability to perform aforementioned activities. Recommend skilled PT to address aforementioned deficits to improve functional independence/tolerance.   OBJECTIVE IMPAIRMENTS: Abnormal gait, decreased activity tolerance, decreased balance, decreased  endurance, decreased mobility, difficulty walking, decreased ROM, decreased strength, impaired perceived functional ability, improper body mechanics, postural dysfunction, and pain.   ACTIVITY LIMITATIONS: carrying, lifting, bending, sitting, standing, squatting, sleeping, stairs, transfers, bed mobility, bathing, toileting, dressing, hygiene/grooming, locomotion level, and caring for others  PARTICIPATION LIMITATIONS: meal prep, cleaning, laundry, driving, shopping, community activity, and occupation  PERSONAL FACTORS: Time since onset of injury/illness/exacerbation and 3+ comorbidities: HTN, DM, anxiety/depression  are also affecting patient's functional outcome.   REHAB POTENTIAL: Fair given comorbidities and symptom severity  CLINICAL DECISION MAKING: Evolving/moderate complexity  EVALUATION COMPLEXITY: Moderate   GOALS: Goals reviewed with patient? No  SHORT TERM GOALS: Target date: 08/27/2022 Pt will demonstrate appropriate understanding and performance of initially prescribed HEP in order to facilitate improved independence with management of symptoms.  Baseline: HEP provided on eval Goal status: INITIAL   2. Pt will score greater than or equal to 25 on FOTO in order to demonstrate improved perception of function due to symptoms.  Baseline: 16  Goal status: INITIAL    LONG TERM GOALS: Target date: 09/24/2022 Pt will score 42 on FOTO in order to demonstrate improved perception of functional status due to symptoms.  Baseline: 16 Goal status: INITIAL  2.  Pt will demonstrate full lumbar extension AROM without provocation of LE symptoms in order to demonstrate improved tolerance to functional movement patterns.  Baseline: see ROM chart above Goal status: INITIAL  3.  Pt will report/demonstrate ability to stand/walk for up to with LRAD and less than 2 pt increase in pain in order to facilitate improved tolerance to household/work tasks. Baseline: unable to tolerate standing  <81min w SPC Goal status: INITIAL  4. Pt will perform 5 consecutive sit to stands with min UE support and less than 2 pt increase in resting pain in order to improve safety w/ transfers.   Baseline: 1 STS, altered mechanics, increase in pain  Goal status: INITIAL   5. Pt will be able to safely navigate 1 flight of stairs with LRAD and unilat rail, less than 2 pt increase in pain on NPS in order to facilitate improved tolerance to home navigation.  Baseline: increased pain and extensive rest breaks with stair navigation at home  Goal status: INITIAL  PLAN:  PT FREQUENCY: 2x/week  PT DURATION: 8 weeks  PLANNED INTERVENTIONS: Therapeutic exercises, Therapeutic activity, Neuromuscular re-education, Balance training, Gait training, Patient/Family education, Self Care, Joint mobilization, Stair training, Aquatic Therapy, Dry Needling, Spinal mobilization, Cryotherapy, Moist heat, Taping, Manual therapy, and Re-evaluation.  PLAN FOR NEXT SESSION: Review/update HEP PRN. Work on [employer] exercises as appropriate with emphasis on gentle lumbar mobility, nerve desensitization, and hip activation. Symptom modification strategies as indicated/appropriate.  Ashley Murrain PT, DPT 07/30/2022 5:49 PM

## 2022-07-30 ENCOUNTER — Other Ambulatory Visit: Payer: Self-pay

## 2022-07-30 ENCOUNTER — Ambulatory Visit: Payer: BC Managed Care – PPO | Attending: Family Medicine | Admitting: Physical Therapy

## 2022-07-30 ENCOUNTER — Encounter: Payer: Self-pay | Admitting: Physical Therapy

## 2022-07-30 DIAGNOSIS — M5432 Sciatica, left side: Secondary | ICD-10-CM | POA: Diagnosis not present

## 2022-07-30 DIAGNOSIS — R2689 Other abnormalities of gait and mobility: Secondary | ICD-10-CM | POA: Diagnosis present

## 2022-07-30 DIAGNOSIS — M5442 Lumbago with sciatica, left side: Secondary | ICD-10-CM | POA: Insufficient documentation

## 2022-08-05 ENCOUNTER — Encounter: Payer: Self-pay | Admitting: Physical Therapy

## 2022-08-05 ENCOUNTER — Ambulatory Visit: Payer: BC Managed Care – PPO | Admitting: Physical Therapy

## 2022-08-05 DIAGNOSIS — M5442 Lumbago with sciatica, left side: Secondary | ICD-10-CM

## 2022-08-05 DIAGNOSIS — R2689 Other abnormalities of gait and mobility: Secondary | ICD-10-CM

## 2022-08-05 NOTE — Therapy (Signed)
OUTPATIENT PHYSICAL THERAPY TREATMENT NOTE   Patient Name: SAJNI HELMREICH MRN: [id number] DOB:Jun 25, 1970, 52 y.o., female Today's Date: 08/05/2022  END OF SESSION:  PT End of Session - 08/05/22 0930     Visit Number 2    Number of Visits 17    Date for PT Re-Evaluation 09/24/22    Authorization Type [employer]    PT Start Time [phone]    PT Stop Time 1012    PT Time Calculation (min) 41 min    Activity Tolerance Patient limited by pain;No increased pain    Behavior During Therapy [facility] for tasks assessed/performed              Past Medical History:  Diagnosis Date   Acute renal failure (HCC) 05/27/10   hemodialysis for 6 weeks   Anxiety    Asthma    Depression    Diabetes mellitus    Hypertension    Rhabdomyolysis 06/06/10   after drug overdose   Suicide attempt by drug ingestion (HCC) 06/05/10   result rhabdomyolosis and ARF requrining dialysis    Past Surgical History:  Procedure Laterality Date   ANKLE ARTHROSCOPY Right 08/17/2015   Procedure: RIGHT ANKLE ARTHROSCOPY WITH SYNOVECTOMY AND LOOSE BODY EXCISION;  Surgeon: Marcene Corning, MD;  Location: [facility] OR;  Service: Orthopedics;  Laterality: Right;   CHOLECYSTECTOMY     TUBAL LIGATION  1998   Patient Active Problem List   Diagnosis Date Noted   Sciatica associated with disorder of lumbar spine 07/09/2022   Sciatica 07/08/2022   Acute left-sided low back pain with left-sided sciatica 07/08/2022   Stomach cramps 03/12/2022   Severe recurrent major depression without psychotic features (HCC) 02/24/2022   Hyperkalemia 02/23/2022   Suicide attempt (HCC) 02/22/2022   Acute-on-chronic kidney injury (HCC) 02/22/2022   Drug overdose 02/20/2022   Onychomycosis 10/01/2021   SVT (supraventricular tachycardia) 05/28/2021   Obesity hypoventilation syndrome (HCC) 05/28/2021   CHF (congestive heart failure) (HCC)    Multifocal pneumonia 04/25/2021   Severe sepsis (HCC)    Healthcare maintenance 04/15/2021   Asthma 12/18/2020    Vaginal irritation 09/28/2020   Mild depression 08/30/2020   Arthritis of right ankle 07/25/2020   Right knee pain 06/21/2020   Sinus pain 03/01/2020   Bleeding nose 03/01/2020   MDD (major depressive disorder) 11/21/2019   Moderate episode of recurrent major depressive disorder (HCC) 09/22/2019   Generalized anxiety disorder 09/22/2019   Bipolar I disorder, most recent episode depressed (HCC) 09/22/2019   Bilateral knee pain 03/14/2019   Osteoarthritis 11/24/2018   Bipolar disorder (HCC) 04/29/2017   HLD (hyperlipidemia) 03/19/2017   Asthma exacerbation 03/14/2017   Weight gain 12/06/2016   MDD (major depressive disorder), recurrent severe, 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   Dyspnea 05/09/2015   HTN (hypertension)    Vitamin D deficiency 04/11/2015   Fatigue 04/10/2015   Non compliance w medication regimen 04/10/2015   Adjustment disorder with mixed anxiety and depressed mood    Cough 09/20/2014   Grief at loss of child 06/22/2014   Dysmenorrhea 11/25/2013   Suicidal ideation 05/27/2011   Chronic kidney disease (CKD) 11/06/2010   Abdominal pain 01/14/2010   ENDOMETRIAL HYPERPLASIA UNSPECIFIED 02/07/2008   MENORRHAGIA 01/03/2008   Allergic rhinitis 06/24/2007   Essential hypertension, benign 02/03/2007   Hypothyroidism 05/07/2006   T2DM (type 2 diabetes mellitus) (HCC) 05/07/2006   Morbid obesity (HCC) 05/07/2006   Iron deficiency anemia 05/07/2006   Anxiety 05/07/2006   Obsessive-compulsive disorder 05/07/2006    PCP: Melba Coon,  Myrlene Broker, DO  REFERRING PROVIDER: Latrelle Dodrill, MD  REFERRING DIAG: [phone] (ICD-10-CM) - Left sided sciatica   Rationale for Evaluation and Treatment: Rehabilitation  THERAPY DIAG:  Low back pain with left-sided sciatica, unspecified back pain laterality, unspecified chronicity  Other abnormalities of gait and mobility  ONSET DATE: mid March 2024  SUBJECTIVE:                                                                                                                                                                                           Per eval - Pt endorses history of R ankle pain and L knee pain. States over the past year she has lost 60lbs primarily through dieting. Notes that in March 2024 she began to develop "a crick" in her R hip which gradually moved to L hip, then began to develop sharp/shooting pains down LLE into toes. Pt has had extensive medical work up with imaging/lab work (refer to [employer] for details), states she has been diagnosed with sciatica. Now having to use an [facility] for ambulation, requiring extensive rest breaks with navigation around home. 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  Difficulty and increased time with ADLs/housework. Feels weak, off balance, and that her leg is asleep. Also has some numbness in R toes on occasion.    SUBJECTIVE STATEMENT: 08/05/2022 Pt arrives w/ report of continued high pain levels. States she has done her HEP most days and it has not irritated symptoms. Continues to endorse stress over pain levels although denies any acute concerns re: mental health, encouragement today to discuss with PCP and mental health provider which pt verbalizes agreement with  PERTINENT HISTORY:  HTN, CHF, SVT, asthma, DM2, CKD, OCD, depression/anxiety  PAIN:  Are you having pain: 9/10 Location/description: low back LLE posteriorly/laterally into toes  Per eval -  Best-worst over past week: 8-10000/10  - aggravating factors: lower body dressing, walking, standing <40min, transfers - Easing factors: lying down (on left side), heating pad, steroid    PRECAUTIONS: cardiac hx, psych-soc hx  WEIGHT BEARING RESTRICTIONS: No  FALLS:  Has patient fallen in last 6 months? 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"  PATIENT SURVEYS:  FOTO 16 current, 42 predicted  SCREENING FOR RED FLAGS: Red flag questioning/screening reassuring, pt has had recent MRI and extensive medical workup (refer to EPIC for details)  COGNITION: Overall cognitive status: Within functional limits for tasks assessed     SENSATION: Light touch intact BLE although diminished L5 and S1 on LLE   MUSCLE LENGTH: NT  POSTURE: reduced lordosis, mild shift towards R  PALPATION: Deferred given time constraints  LUMBAR ROM:   AROM eval  Flexion 100% (seated, painless)  Extension 75% seated, painful with provocation of LLE symptoms  Right lateral flexion   Left lateral flexion   Right rotation 100% painless    Left rotation 100% painless   (Blank rows = not tested)  LOWER EXTREMITY ROM:     Active  Right eval Left eval  Hip flexion    Hip extension    Hip internal rotation    Hip external rotation    Knee extension    Knee flexion    (Blank rows = not tested) (Key: WFL = within functional limits not formally assessed, * = concordant pain, s = stiffness/stretching sensation, NT = not tested)  Comments:    LOWER EXTREMITY MMT:    MMT Right eval Left eval  Hip flexion    Hip abduction (modified sitting)    Hip internal rotation    Hip external rotation    Knee flexion    Knee extension    Ankle dorsiflexion     (Blank rows = not tested) (Key: WFL = within functional limits not formally assessed, * = concordant  pain, s = stiffness/stretching sensation, NT = not tested)  Comments: deferred given symptom irritability  LUMBAR SPECIAL TESTS:  + slump test LLE, negative on R  FUNCTIONAL TESTS:  Sit to stand: increase in pain, heavy B UE support from surface, trunk lean to R, wide BOS, reduced fwd trunk lean  GAIT: Distance walked: within clinic Assistive device utilized: Single point cane Level of assistance: Modified independence Comments: antalgic gait LLE, reduced gait speed/cadence, wide BOS, limited trunk rotation  TODAY'S TREATMENT:                                                                                                                              [facility] Adult PT Treatment:                                                DATE: 08/05/22 Therapeutic Exercise: Modified nerve glide 3x10 (heel/toe raises) performed throughout for symptom modification Seated adduction iso 2x8 cues for form and breath control  Seated pelvic tilt 2x8 cues for comfortable  ROM and breath control  Seated trunk rotation x10 BIL  HEP update + education/handout  Therapeutic Activity: Significant time spent w/ education on symptom behavior, activity modification, rationale for interventions, role of PT, relevant anatomy/physiology, reasonable expectations/prognosis re: course of care as it relates to activity tolerance and appropriate reassurance, communication w/ providers re: pain management strategies as it pertains to ADLs/mobility   [facility] Adult PT Treatment:                                                DATE: 07/30/22 Therapeutic Exercise: Modified sciatic nerve glides LLE (heel/toe raises given symptom irritability w/ testing position) x8 Seated trunk rotation BIL x8 HEP handout + education on safety and appropriate performance   PATIENT EDUCATION:  Education details: rationale for intervention, activity modification, HEP  Person educated: Patient Education method: Explanation, Demonstration, Tactile cues,  Verbal cues, and Handouts Education comprehension: verbalized understanding, returned demonstration, verbal cues required, tactile cues required, and needs further education    HOME EXERCISE PROGRAM: Access Code: [email or web address] URL: [email or web address] Date: 08/05/2022 Prepared by: Fransisco Hertz  Exercises - Seated Heel Toe Raises  - 1 x daily - 7 x weekly - 3 sets - 8 reps - Seated Trunk Rotation - Arms Crossed  - 1 x daily - 7 x weekly - 3 sets - 8 reps - Seated Hip Adduction Isometrics with Ball  - 1 x daily - 7 x weekly - 3 sets - 8 reps  ASSESSMENT:  CLINICAL IMPRESSION: 08/05/2022 Pt arrives w/ report of 9/10 pain, denies any significant changes since initial evaluation. Pt continues to endorse high levels of stress re: pain, concerns are addressed appropriately from PT perspective as they pertain to activity tolerance and mobility although she is advised to discuss her stress levels and pain with PCP and mental health provider. Pt does well with HEP review today, no issues. Mild discomfort initially with adductor isos which produce LLE tingling, although this is resolved with additional set of nerve glides and does not occur on second set of adductor isos. Pt also endorses improving tolerance to pelvic tilts with repetition. No adverse events, pt denies any increase in pain on departure, significant time spent w/ education and appropriate reassurance. Recommend continuing along current POC in order to address relevant deficits and improve functional tolerance. Pt departs today's session in no acute distress, all voiced questions/concerns addressed appropriately from PT perspective.     Per eval - Pt is a very pleasant 52 year old woman who arrives to PT evaluation on this date for low back pain. Of note, pt was admitted to [facility] for this issue from 07/08/22-07/09/22 and left AMA, returned to ED on 07/12/22; pt followed up with family medicine on 07/14/22 and 07/30/22, their notes indicate  plan to proceed with PT (please refer to [facility] as appropriate). Pt endorses gradual onset of symptoms in March 2024, now limiting ambulation, household activities, and ability to work. Exam is limited by symptom irritability although slump test on LLE is markedly positive, lumbar ROM mildly limited with extension worsening LLE symptoms. Increased time spent w/ discussion/education as pt endorses increased life stressors along with her pain, states she is following with providers for this. Despite symptom irritability on exam, pt tolerates HEP without increase in pain, extensive education on appropriate performance and modifying according to symptom response. Recommend skilled PT to  address pain levels, lumbopelvic mobility/strength, and functional mobility. No adverse events, pt departs today's session in no acute distress, all voiced questions/concerns addressed appropriately from PT perspective.    OBJECTIVE IMPAIRMENTS: Abnormal gait, decreased activity tolerance, decreased balance, decreased endurance, decreased mobility, difficulty walking, decreased ROM, decreased strength, impaired perceived functional ability, improper body mechanics, postural dysfunction, and pain.   ACTIVITY LIMITATIONS: carrying, lifting, bending, sitting, standing, squatting, sleeping, stairs, transfers, bed mobility, bathing, toileting, dressing, hygiene/grooming, locomotion level, and caring for others  PARTICIPATION LIMITATIONS: meal prep, cleaning, laundry, driving, shopping, community activity, and occupation  PERSONAL FACTORS: Time since onset of injury/illness/exacerbation and 3+ comorbidities: HTN, DM, anxiety/depression  are also affecting patient's functional outcome.   REHAB POTENTIAL: Fair given comorbidities and symptom severity  CLINICAL DECISION MAKING: Evolving/moderate complexity  EVALUATION COMPLEXITY: Moderate   GOALS: Goals reviewed with patient? No  SHORT TERM GOALS: Target date: 08/27/2022 Pt  will demonstrate appropriate understanding and performance of initially prescribed HEP in order to facilitate improved independence with management of symptoms.  Baseline: HEP provided on eval Goal status: INITIAL   2. Pt will score greater than or equal to 25 on FOTO in order to demonstrate improved perception of function due to symptoms.  Baseline: 16  Goal status: INITIAL    LONG TERM GOALS: Target date: 09/24/2022 Pt will score 42 on FOTO in order to demonstrate improved perception of functional status due to symptoms.  Baseline: 16 Goal status: INITIAL  2.  Pt will demonstrate full lumbar extension AROM without provocation of LE symptoms in order to demonstrate improved tolerance to functional movement patterns.  Baseline: see ROM chart above Goal status: INITIAL  3.  Pt will report/demonstrate ability to stand/walk for up to with LRAD and less than 2 pt increase in pain in order to facilitate improved tolerance to household/work tasks. Baseline: unable to tolerate standing <72min w SPC Goal status: INITIAL  4. Pt will perform 5 consecutive sit to stands with min UE support and less than 2 pt increase in resting pain in order to improve safety w/ transfers.   Baseline: 1 STS, altered mechanics, increase in pain  Goal status: INITIAL   5. Pt will be able to safely navigate 1 flight of stairs with LRAD and unilat rail, less than 2 pt increase in pain on NPS in order to facilitate improved tolerance to home navigation.  Baseline: increased pain and extensive rest breaks with stair navigation at home  Goal status: INITIAL  PLAN:  PT FREQUENCY: 2x/week  PT DURATION: 8 weeks  PLANNED INTERVENTIONS: Therapeutic exercises, Therapeutic activity, Neuromuscular re-education, Balance training, Gait training, Patient/Family education, Self Care, Joint mobilization, Stair training, Aquatic Therapy, Dry Needling, Spinal mobilization, Cryotherapy, Moist heat, Taping, Manual therapy, and  Re-evaluation.  PLAN FOR NEXT SESSION: Review/update HEP PRN. Work on [employer] exercises as appropriate with emphasis on gentle lumbar mobility, nerve desensitization, and hip activation. Symptom modification strategies as indicated/appropriate.     Ashley Murrain PT, DPT 08/05/2022 11:23 AM

## 2022-08-06 ENCOUNTER — Telehealth (INDEPENDENT_AMBULATORY_CARE_PROVIDER_SITE_OTHER): Payer: BC Managed Care – PPO | Admitting: Psychiatry

## 2022-08-06 ENCOUNTER — Encounter (HOSPITAL_COMMUNITY): Payer: Self-pay | Admitting: Psychiatry

## 2022-08-06 DIAGNOSIS — F411 Generalized anxiety disorder: Secondary | ICD-10-CM

## 2022-08-06 DIAGNOSIS — F32A Depression, unspecified: Secondary | ICD-10-CM | POA: Diagnosis not present

## 2022-08-06 MED ORDER — HYDROXYZINE HCL 50 MG PO TABS
50.0000 mg | ORAL_TABLET | Freq: Three times a day (TID) | ORAL | 3 refills | Status: DC | PRN
Start: 2022-08-06 — End: 2022-10-21

## 2022-08-06 MED ORDER — FLUOXETINE HCL 40 MG PO CAPS
80.0000 mg | ORAL_CAPSULE | Freq: Every day | ORAL | 3 refills | Status: DC
Start: 2022-08-06 — End: 2022-08-19

## 2022-08-06 MED ORDER — MIRTAZAPINE 15 MG PO TABS
30.0000 mg | ORAL_TABLET | Freq: Every day | ORAL | 3 refills | Status: DC
Start: 2022-08-06 — End: 2022-10-21

## 2022-08-06 NOTE — Progress Notes (Signed)
BH MD/PA/NP OP Progress Note Virtual Visit via Video Note  I connected with [name] on 08/06/22 at  3:30 PM EDT by a video enabled telemedicine application and verified that I am speaking with the correct person using two identifiers.  Location: Patient:work Provider: Clinic   I discussed the limitations of evaluation and management by telemedicine and the availability of in person appointments. The patient expressed understanding and agreed to proceed.  I provided 30 minutes of non-face-to-face time during this encounter.       08/06/2022 4:05 PM [name]  MRN:  [id number]  Chief Complaint: "I have sciatica"  HPI: 52 year old female seen today for follow up psychiatric evaluation. She has a psychiatric history of  OCD, adjustment disorder, anxiety, depression, SI, and bipolar disorder. She is currently being managed on  Prozac 80 mg daily, Vistaril 50 mg three times daily, and mirtazapine 30 mg at bedtime.  She informed Clinical research associate that her medications are effective in managing her psychiatric conditions.  Today she was well-groomed, pleasant, cooperative, and engaged in conversation.She informed Clinical research associate that she has been diagnosed with sciatica. She notes that she has difficulty walking and utilized a came. She quantifies her pain as 10/10. She notes that Lyrica and gabapentin has been unsuccessful. She reports that she has been  having injections monthly. Patient notes that this has been occurring since March 25th, 2024. She reports that she has not been able to work and currently does not have short tem disability. Provider recommended patient speaking to HR and applying for short term disability.  Patient informed writer that the above worsens her anxiety and depression.  During exam patient grimaced in pain.  At this time she is not able to do a GAD-7 or PHQ-9.  She does endorse passive SI but denies wanting to harm herself.  She denies SI/HI/VAH, mania, paranoia.    Provider discussed potentially using Cymbalta in the future to help manage pain however informed her that either Prozac or mirtazapine would have to be discontinued.  Provider did not recommend discontinuing these medications at this time is mentally patient is doing well (last psychiatric hospitalization Dec 2023).  At this time no medication changes made.  Patient agreed to continue medication as prescribed.  Patient no other concerns at this time.    Visit Diagnosis:    ICD-10-CM   1. Generalized anxiety disorder  F41.1     2. Mild depression  F32.A        Past Psychiatric History: OCD, adjustment disorder, anxiety, depression, SI, and bipolar disorder.   Past Medical History:  Past Medical History:  Diagnosis Date   Acute renal failure (HCC) 05/27/10   hemodialysis for 6 weeks   Anxiety    Asthma    Depression    Diabetes mellitus    Hypertension    Rhabdomyolysis 06/06/10   after drug overdose   Suicide attempt by drug ingestion (HCC) 06/05/10   result rhabdomyolosis and ARF requrining dialysis     Past Surgical History:  Procedure Laterality Date   ANKLE ARTHROSCOPY Right 08/17/2015   Procedure: RIGHT ANKLE ARTHROSCOPY WITH SYNOVECTOMY AND LOOSE BODY EXCISION;  Surgeon: Marcene Corning, MD;  Location: [facility];  Service: Orthopedics;  Laterality: Right;   CHOLECYSTECTOMY     TUBAL LIGATION  1998    Family Psychiatric History: Unknown  Family History:  Family History  Problem Relation Age of Onset   Asthma Father     Social History:  Social History  Socioeconomic History   Marital status: Married    Spouse name: Not on file   Number of children: 2   Years of education: Not on file   Highest education level: Not on file  Occupational History   Occupation: Research officer, trade union  Tobacco Use   Smoking status: Never    Passive exposure: Never   Smokeless tobacco: Never  Vaping Use   Vaping Use: Never used  Substance and Sexual Activity   Alcohol use: No   Drug  use: No   Sexual activity: Yes    Comment: with husband   Other Topics Concern   Not on file  Social History Narrative   Married high school boyfriend at age 14, two boys, still married but he has been living with other women for years.   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         Social Determinants of Corporate investment banker Strain: Not on file  Food Insecurity: No Food Insecurity (07/08/2022)   Hunger Vital Sign    Worried About Programme researcher, broadcasting/film/video in the Last Year: Never true    Ran Out of Food in the Last Year: Never true  Recent Concern: Food Insecurity - Food Insecurity Present (07/08/2022)   Hunger Vital Sign    Worried About Programme researcher, broadcasting/film/video in the Last Year: Sometimes true    Ran Out of Food in the Last Year: Sometimes true  Transportation Needs: No Transportation Needs (07/08/2022)   PRAPARE - Administrator, Civil Service (Medical): No    Lack of Transportation (Non-Medical): No  Physical Activity: Not on file  Stress: Not on file  Social Connections: Not on file    Allergies:  Allergies  Allergen Reactions   Ace Inhibitors Other (See Comments)    Patient had acute renal failure requiring hemodialysis after a suicide attempt.  Nephro recommended avoiding use.   Haldol [Haloperidol Lactate] Other (See Comments)    Tardive dyskinesia   Nsaids Other (See Comments)    Nephro recommended avoiding after acute renal failure requiring hemodialysis associated with a suicide attempt.   Entresto [Sacubitril-Valsartan] Itching   Latex Other (See Comments)    Rash around IV site    Metabolic Disorder Labs: Lab Results  Component Value Date   HGBA1C 7.7 (H) 07/09/2022   MPG 174.29 07/09/2022   MPG 243.17 11/14/2017   No results found for: "PROLACTIN" Lab Results  Component Value Date   CHOL 201 (H) 04/15/2021   TRIG 231 (H) 04/15/2021   HDL 49 04/15/2021   CHOLHDL 4.1 04/15/2021   VLDL 46 (H)  04/10/2015   LDLCALC 112 (H) 04/15/2021   LDLCALC 125 (H) 12/19/2020   Lab Results  Component Value Date   TSH 1.015 02/20/2022   TSH 2.689 05/19/2021    Therapeutic Level Labs: No results found for: "LITHIUM" No results found for: "VALPROATE" No results found for: "CBMZ"  Current Medications: Current Outpatient Medications  Medication Sig Dispense Refill   albuterol (VENTOLIN HFA) 108 (90 Base) MCG/ACT inhaler Inhale 2 puffs into the lungs every 6 (six) hours as needed for shortness of breath. 18 g 1   blood glucose meter kit and supplies KIT Dispense based on patient and insurance preference. Use up to four times daily as directed. 1 each 0   cyclobenzaprine (FLEXERIL) 10 MG tablet Take 1 tablet (10 mg total) by mouth 3 (three) times daily as  needed for muscle spasms. 10 tablet 0   FLUoxetine (PROZAC) 40 MG capsule Take 2 capsules (80 mg total) by mouth daily. 60 capsule 3   fluticasone (FLONASE) 50 MCG/ACT nasal spray Place 1 spray into both nostrils daily. 1 spray in each nostril every day 16 g 1   fluticasone-salmeterol (WIXELA INHUB) 250-50 MCG/ACT AEPB Inhale 1 puff into the lungs in the morning and at bedtime. 60 each 1   furosemide (LASIX) 40 MG tablet Take 1 tablet (40 mg total) by mouth daily. 30 tablet 0   gabapentin (NEURONTIN) 100 MG capsule Take 2 capsules (200 mg total) by mouth 3 (three) times daily. (Patient not taking: Reported on 07/24/2022) 60 capsule 0   hydrOXYzine (ATARAX) 50 MG tablet Take 1 tablet (50 mg total) by mouth 3 (three) times daily as needed for anxiety. 90 tablet 3   insulin glargine (LANTUS) 100 UNIT/ML Solostar Pen Inject 65 Units into the skin 2 (two) times daily. 15 mL 2   Insulin Pen Needle 32G X 4 MM MISC 1 each by Does not apply route 3 (three) times daily. 300 each 2   isosorbide-hydrALAZINE (BIDIL) 20-37.5 MG tablet Take 1 tablet by mouth 3 (three) times daily. 90 tablet 0   levothyroxine (SYNTHROID) 300 MCG tablet Take 1 tablet (300 mcg  total) by mouth daily at 6 (six) AM. 90 tablet 2   lidocaine (LIDODERM) 5 % Place 1 patch onto the skin daily. Remove & Discard patch within 12 hours or as directed by MD 30 patch 0   LORazepam (ATIVAN) 1 MG tablet Take 1 tablet by mouth twice daily as needed for anxiety 45 tablet 0   metoprolol tartrate (LOPRESSOR) 25 MG tablet Take 1 tablet (25 mg total) by mouth 2 (two) times daily. 60 tablet 0   mirtazapine (REMERON) 15 MG tablet Take 2 tablets (30 mg total) by mouth at bedtime. 60 tablet 3   NOVOLOG FLEXPEN 100 UNIT/ML FlexPen INJECT 15 UNITS SUBCUTANEOUSLY WITH LUNCH AND INJECT 20 UNITS SUBCUTANEOUSLY WITH SUPPER 15 mL 2   oxyCODONE (ROXICODONE) 5 MG immediate release tablet Take 1 tablet (5 mg total) by mouth every 4 (four) hours as needed for severe pain. (Patient not taking: Reported on 07/29/2022) 15 tablet 0   rosuvastatin (CRESTOR) 20 MG tablet Take 1 tablet (20 mg total) by mouth daily. 30 tablet 0   Semaglutide, 2 MG/DOSE, (OZEMPIC, 2 MG/DOSE,) 8 MG/3ML SOPN Inject 2 mg into the skin once per week. 3 mL 2   No current facility-administered medications for this visit.     Musculoskeletal: Strength & Muscle Tone: within normal limits, telehealth visit Gait & Station: normal, telehealth visit Patient leans: N/A  Psychiatric Specialty Exam: Review of Systems  There were no vitals taken for this visit.There is no height or weight on file to calculate BMI.  General Appearance: Well Groomed  Eye Contact:  Good  Speech:  Clear and Coherent and Normal Rate  Volume:  Normal  Mood:  Anxious and Depressed Improving  Affect:  Appropriate and Congruent  Thought Process:  Coherent, Goal Directed and Linear  Orientation:  Full (Time, Place, and Person)  Thought Content: WDL and Logical   Suicidal Thoughts:  Yes.  without intent/plan  Homicidal Thoughts:  No  Memory:  Immediate;   Good Recent;   Good Remote;   Good  Judgement:  Good  Insight:  Good  Psychomotor Activity:  Normal   Concentration:  Concentration: Good and Attention Span: Good  Recall:  Good  Fund of Knowledge: Good  Language: Good  Akathisia:  No  Handed:  Right  AIMS (if indicated): Not done  Assets:  Communication Skills Desire for Improvement Financial Resources/Insurance Housing Social Support  ADL's:  Intact  Cognition: WNL  Sleep:  Fair   Screenings: AIMS    Flowsheet Row Admission (Discharged) from 02/24/2022 in [facility] INPATIENT BEHAVIORAL MEDICINE  AIMS Total Score 0      AUDIT    Flowsheet Row Admission (Discharged) from 02/24/2022 in [facility] INPATIENT BEHAVIORAL MEDICINE  Alcohol Use Disorder Identification Test Final Score (AUDIT) 0      GAD-7    Flowsheet Row Video Visit from 05/21/2022 in [facility] Video Visit from 03/26/2022 in [facility] Counselor from 03/20/2022 in [facility] Video Visit from 11/12/2021 in [facility] Video Visit from 08/30/2020 in [facility]  Total GAD-7 Score 12 14 7 5 2       PHQ2-9    Flowsheet Row Video Visit from 05/21/2022 in [facility] Office Visit from 04/22/2022 in [facility] Video Visit from 03/26/2022 in [facility] Counselor from 03/20/2022 in [facility] Office Visit from 03/12/2022 in [facility]  PHQ-2 Total Score 4 2 3 2  0  PHQ-9 Total Score 13 6 12 10  0      Flowsheet Row ED from 07/12/2022 in [facility] Emergency Department at [facility] ED to Hosp-Admission (Discharged) from 07/08/2022 in [facility] GENERAL MED/SURG UNIT ED from 06/28/2022 in [facility] Emergency Department at [facility]  C-SSRS RISK CATEGORY No Risk No Risk No Risk        Assessment and Plan: Patient endorses symptoms of anxiety and depression which are exacerbated by  her sciatic pain.  She informed Clinical research associate that she finds her psychiatric medications are effective. Provider discussed potentially using Cymbalta in the future to help manage pain however informed her that either Prozac or mirtazapine would have to be discontinued.  Provider did not recommend discontinuing these medications at this time is mentally patient is doing well (last psychiatric hospitalization Dec 2023).  At this time no medication changes made.  Patient agreed to continue medication as prescribed.   1. Generalized anxiety disorder  Continue- hydrOXYzine (ATARAX) 50 MG tablet; Take 1 tablet (50 mg total) by mouth 3 (three) times daily as needed for anxiety.  Dispense: 90 tablet; Refill: 3 Continue- mirtazapine (REMERON) 45 MG tablet; Take 1 tablet (45 mg total) by mouth at bedtime.  Dispense: 30 tablet; Refill: 3 Continue- FLUoxetine (PROZAC) 40 MG capsule; Take 2 capsules (80 mg total) by mouth daily.  Dispense: 60 capsule; Refill: 3  2. Mild depression  Continue- mirtazapine (REMERON) 45 MG tablet; Take 1 tablet (45 mg total) by mouth at bedtime.  Dispense: 30 tablet; Refill: 3 Continue- FLUoxetine (PROZAC) 40 MG capsule; Take 2 capsules (80 mg total) by mouth daily.  Dispense: 60 capsule; Refill: 3    Follow-up in 2 months  Shanna Cisco, NP 08/06/2022, 4:05 PM

## 2022-08-12 ENCOUNTER — Other Ambulatory Visit: Payer: Self-pay | Admitting: Family Medicine

## 2022-08-12 ENCOUNTER — Ambulatory Visit: Payer: BC Managed Care – PPO | Admitting: Physical Therapy

## 2022-08-15 ENCOUNTER — Encounter: Payer: Self-pay | Admitting: Family Medicine

## 2022-08-15 ENCOUNTER — Telehealth: Payer: Self-pay | Admitting: Family Medicine

## 2022-08-15 ENCOUNTER — Telehealth (HOSPITAL_COMMUNITY): Payer: Self-pay

## 2022-08-15 ENCOUNTER — Other Ambulatory Visit: Payer: Self-pay

## 2022-08-15 ENCOUNTER — Ambulatory Visit (INDEPENDENT_AMBULATORY_CARE_PROVIDER_SITE_OTHER): Payer: BC Managed Care – PPO | Admitting: Family Medicine

## 2022-08-15 VITALS — BP 104/69 | HR 77 | Wt 344.0 lb

## 2022-08-15 DIAGNOSIS — Z5941 Food insecurity: Secondary | ICD-10-CM | POA: Diagnosis not present

## 2022-08-15 DIAGNOSIS — M5432 Sciatica, left side: Secondary | ICD-10-CM

## 2022-08-15 MED ORDER — OXYCODONE HCL 5 MG PO TABS
5.0000 mg | ORAL_TABLET | ORAL | 0 refills | Status: DC | PRN
Start: 2022-08-15 — End: 2022-09-08

## 2022-08-15 NOTE — Assessment & Plan Note (Addendum)
Still having ongoing issues with lower extremity pain for the past 3 months secondary to sciatica demonstrated on MRI with facet joint effusions.  She is still having significant pain despite multiple treatments. - will send staff message to psychiatrist regarding switch to SNRI - will send short course of oxycodone 5 mg #15 tablets for breakthrough pain, advised that for further refills she will need to see pain management specialist - she has recently restarted pregabalin 75 mg BID - continue PT

## 2022-08-15 NOTE — Patient Instructions (Addendum)
It was nice seeing you today!  I will message your psychiatric provider regarding Cymbalta.  Expect a phone call from a Child psychotherapist.  I prescribed you a short course of oxycodone to use for breakthrough pain.  If you need further refills, you will need to be seen by pain management.  Stay well, Littie Deeds, MD [facility] [phone]  --  Make sure to check out at the front desk before you leave today.  Please arrive at least 15 minutes prior to your scheduled appointments.  If you had blood work today, I will send you a MyChart message or a letter if results are normal. Otherwise, I will give you a call.  If you had a referral placed, they will call you to set up an appointment. Please give [name] a call if you don't hear back in the next 2 weeks.  If you need additional refills before your next appointment, please call your pharmacy first.

## 2022-08-15 NOTE — Telephone Encounter (Signed)
Patient dropped off form at front desk for Physicians Statement for Disability Claim.  Verified that patient section of form has been completed.  Last DOS/WCC with PCP was 07/10/22.  Placed form in red team folder to be completed by clinical staff.  [name]

## 2022-08-15 NOTE — Telephone Encounter (Signed)
Medication problem - Attempted to call patient back 2 times this date but no answer and messages were full so could not leave a message. Pt had left one that she went to see her family medicine provider today with sciatic nerve pain and that he suggested a medication change to Celexa by Dr. Doyne Keel that would continue to help patient's anxiety and depression while also helping with her reported sciatic nerve pain. Unable to speak to patient to verify due to no answer or ability to leave a message but message sent to Dr. Doyne Keel for review.

## 2022-08-15 NOTE — Progress Notes (Signed)
SUBJECTIVE:   CHIEF COMPLAINT / HPI:  Chief Complaint  Patient presents with   pain in legs    Patient reports that she is still having significant left lower extremity pain.  Also has some right lower extremity pain but is worse on the left.  This has been an ongoing issue for the past 3 months due to sciatica.  She has had facet joint injections done which she reports did help with her back pain, which is improved.  However, she is still experiencing shooting pain originating from her buttock down to her feet.  She experiences associated numbness and tingling.  She has been out of work since April due to pain.  She has gone to a few PT sessions which has been helping maybe a little bit, but she does feel quite sore the following day.  She has been doing exercises at home. For pain she has been taking Tylenol, and she refilled her prior prescription of pregabalin recently.  She is not taking gabapentin with this.  She has also tried Biofreeze without significant relief. Reports that heat has been the most helpful. In the past week, she has developed more pain in the left knee.  She did recently see her psychiatric provider who did suggest duloxetine.  PERTINENT  PMH / PSH: CKD stage III  Patient Care Team: Sabino Dick, DO as PCP - General (Family Medicine) Maisie Fus, MD as PCP - Cardiology (Cardiology)   OBJECTIVE:   BP 104/69   Pulse 77   Wt (!) 344 lb (156 kg)   SpO2 95%   BMI 47.98 kg/m   Physical Exam Constitutional:      General: She is not in acute distress.    Appearance: She is obese.     Comments: Uncomfortable appearing,  intermittently tearful  Cardiovascular:     Rate and Rhythm: Normal rate and regular rhythm.  Pulmonary:     Effort: Pulmonary effort is normal. No respiratory distress.     Breath sounds: Normal breath sounds.  Musculoskeletal:     Cervical back: Neck supple.  Neurological:     Mental Status: She is alert.     Comments: 5/5  strength with knee extension, knee flexion, dorsiflexion, plantarflexion on the left.         08/15/2022    1:39 PM  Depression screen PHQ 2/9  Decreased Interest 3  Down, Depressed, Hopeless 3  PHQ - 2 Score 6  Altered sleeping 3  Tired, decreased energy 3  Change in appetite 3  Feeling bad or failure about yourself  3  Trouble concentrating 3  Moving slowly or fidgety/restless 0  Suicidal thoughts 0  PHQ-9 Score 21     {Show previous vital signs (optional):23777}    ASSESSMENT/PLAN:   Problem List Items Addressed This Visit       Nervous and Auditory   Sciatica - Primary    Still having ongoing issues with lower extremity pain for the past 3 months secondary to sciatica demonstrated on MRI with facet joint effusions.  She is still having significant pain despite multiple treatments. - will send staff message to psychiatrist regarding switch to SNRI - will send short course of oxycodone 5 mg #15 tablets for breakthrough pain, advised that for further refills she will need to see pain management specialist - she has recently restarted pregabalin 75 mg BID - continue PT      Relevant Medications   oxyCODONE (ROXICODONE) 5 MG immediate release  tablet   Other Visit Diagnoses     Food insecurity       Relevant Orders   AMB Referral to Managed Medicaid Care Management     She is experiencing food insecurity due to being out of work for the past 2 months due to severe back pain.  I did discuss with her social work referral, and she was amenable to this.  Return if symptoms worsen or fail to improve.   Littie Deeds, MD [facility]

## 2022-08-15 NOTE — Assessment & Plan Note (Signed)
>>  ASSESSMENT AND PLAN FOR SCIATICA WRITTEN ON 08/15/2022  3:59 PM BY Littie Deeds, MD  Still having ongoing issues with lower extremity pain for the past 3 months secondary to sciatica demonstrated on MRI with facet joint effusions.  She is still having significant pain despite multiple treatments. - will send staff message to psychiatrist regarding switch to SNRI - will send short course of oxycodone 5 mg #15 tablets for breakthrough pain, advised that for further refills she will need to see pain management specialist - she has recently restarted pregabalin 75 mg BID - continue PT

## 2022-08-15 NOTE — Telephone Encounter (Signed)
Medication management - Patient called back and stated that Dr. Doyne Keel had previously discussed possibly changing one of her medications and trying Celexa and that she was ready to try this as PCP was fine with this as well.  Informed message with request would be sent to Dr. Doyne Keel who would be back checking messages in the coming week and patient in agreement with this plan.

## 2022-08-18 NOTE — Telephone Encounter (Signed)
Form has been placed in your box for completion, please finish when you have time. Thank you. Alea Ryer CMA  

## 2022-08-19 ENCOUNTER — Other Ambulatory Visit (HOSPITAL_COMMUNITY): Payer: Self-pay | Admitting: Psychiatry

## 2022-08-19 ENCOUNTER — Other Ambulatory Visit: Payer: BC Managed Care – PPO

## 2022-08-19 DIAGNOSIS — M5432 Sciatica, left side: Secondary | ICD-10-CM

## 2022-08-19 DIAGNOSIS — F332 Major depressive disorder, recurrent severe without psychotic features: Secondary | ICD-10-CM

## 2022-08-19 DIAGNOSIS — F411 Generalized anxiety disorder: Secondary | ICD-10-CM

## 2022-08-19 MED ORDER — DULOXETINE HCL 20 MG PO CPEP
20.0000 mg | ORAL_CAPSULE | Freq: Every day | ORAL | 3 refills | Status: DC
Start: 2022-08-19 — End: 2022-10-17

## 2022-08-19 NOTE — Patient Outreach (Signed)
Medicaid Managed Care Social Work Note  08/19/2022 Name:  [name] MRN:  [id number] DOB:  [birth year]  [name] is an 52 y.o. year old female who is a primary patient of Sabino Dick, DO.  The Medicaid Managed Care Coordination team was consulted for assistance with:  Community Resources   Ms. Toriz was given information about Medicaid Managed Care Coordination team services today. [name] Patient agreed to services and verbal consent obtained.  Engaged with patient  for by telephone forinitial visit in response to referral for case management and/or care coordination services.   Assessments/Interventions:  Review of past medical history, allergies, medications, health status, including review of consultants reports, laboratory and other test data, was performed as part of comprehensive evaluation and provision of chronic care management services.  SDOH: (Social Determinant of Health) assessments and interventions performed: SDOH Interventions    Flowsheet Row Office Visit from 06/23/2022 in [facility] ED to Hosp-Admission (Discharged) from 02/20/2022 in [facility] Office Visit from 10/01/2021 in [facility] Office Visit from 07/25/2020 in [facility]  SDOH Interventions      Transportation Interventions -- Contracted Vendor  [Safe transport] -- --  Depression Interventions/Treatment  Medication, Currently on Treatment -- Medication Referral to Psychiatry, Currently on Treatment     BSW completed a telephone outreach with patient, she states she currently has no income, she is out of work and cannot get her STD until her PCP completes her paperwork. Patient is not receiving any foodstamps. Patient would like food resources and resources for rent, utitilites and water aerobics. BSW will mail resources to patient.  Advanced Directives Status:  Not addressed in this  encounter.  Care Plan                 Allergies  Allergen Reactions   Ace Inhibitors Other (See Comments)    Patient had acute renal failure requiring hemodialysis after a suicide attempt.  Nephro recommended avoiding use.   Haldol [Haloperidol Lactate] Other (See Comments)    Tardive dyskinesia   Nsaids Other (See Comments)    Nephro recommended avoiding after acute renal failure requiring hemodialysis associated with a suicide attempt.   Entresto [Sacubitril-Valsartan] Itching   Latex Other (See Comments)    Rash around IV site    Medications Reviewed Today     Reviewed by Pamelia Hoit, CMA (Certified Medical Assistant) on 08/15/22 at 1339  Med List Status: <None>   Medication Order Taking? Sig Documenting Provider Last Dose Status Informant  albuterol (VENTOLIN HFA) 108 (90 Base) MCG/ACT inhaler [id number] No Inhale 2 puffs into the lungs every 6 (six) hours as needed for shortness of breath. Clapacs, Jackquline Denmark, MD Taking Active Self, Pharmacy Records  blood glucose meter kit and supplies KIT [id number] No Dispense based on patient and insurance preference. Use up to four times daily as directed. Sabino Dick, DO Taking Active Self, Pharmacy Records  cyclobenzaprine (FLEXERIL) 10 MG tablet [id number] No Take 1 tablet (10 mg total) by mouth 3 (three) times daily as needed for muscle spasms. Sabino Dick, DO Taking Active   FLUoxetine (PROZAC) 40 MG capsule [id number]  Take 2 capsules (80 mg total) by mouth daily. Shanna Cisco, NP  Active   fluticasone (FLONASE) 50 MCG/ACT nasal spray [id number] No Place 1 spray into both nostrils daily. 1 spray in each nostril every day Sabino Dick, DO Taking Active Self, Pharmacy  Records           Med Note Cooper Render, CAMILLE N   Tue Jul 08, 2022  5:36 PM) Taking as needed  fluticasone-salmeterol [facility] INHUB) 250-50 MCG/ACT AEPB [id number] No Inhale 1 puff into the lungs in the morning and at bedtime. Sabino Dick, DO  Taking Active Self, Pharmacy Records           Med Note [facility], [facility] [place]   Thu Jul 24, 2022  1:18 PM) Taking as needed.   furosemide (LASIX) 40 MG tablet [id number] No Take 1 tablet (40 mg total) by mouth daily. Clapacs, Jackquline Denmark, MD Taking Active Self, Pharmacy Records  gabapentin (NEURONTIN) 100 MG capsule [id number] No Take 2 capsules (200 mg total) by mouth 3 (three) times daily.  Patient not taking: Reported on 07/24/2022   Sabino Dick, DO Not Taking Active            Med Note Edwena Blow, Janene Harvey   Thu Jul 24, 2022  1:18 PM) Patient states she no longer has any refills.  hydrOXYzine (ATARAX) 50 MG tablet [id number]  Take 1 tablet (50 mg total) by mouth 3 (three) times daily as needed for anxiety. Toy Cookey E, NP  Active   insulin glargine (LANTUS) 100 UNIT/ML Solostar Pen [id number]  Inject 65 Units into the skin 2 (two) times daily. Sabino Dick, DO  Active   Insulin Pen Needle 32G X 4 MM MISC [id number] No 1 each by Does not apply route 3 (three) times daily. Sabino Dick, DO Taking Active Self, Pharmacy Records  Patient taking differently:  Discontinued 05/21/21 1320 (Reorder) isosorbide-hydrALAZINE (BIDIL) 20-37.5 MG tablet [id number] No Take 1 tablet by mouth 3 (three) times daily. Clapacs, Jackquline Denmark, MD Taking Active Self, Pharmacy Records  levothyroxine (SYNTHROID) 300 MCG tablet [id number] No Take 1 tablet (300 mcg total) by mouth daily at 6 (six) AM. Sabino Dick, DO Taking Active Self, Pharmacy Records  lidocaine (LIDODERM) 5 % [id number] No Place 1 patch onto the skin daily. Remove & Discard patch within 12 hours or as directed by MD Henderly, Britni A, PA-C Taking Active   LORazepam (ATIVAN) 1 MG tablet [id number]  Take 1 tablet by mouth twice daily as needed for anxiety Ganta, Anupa, DO  Active   metoprolol tartrate (LOPRESSOR) 25 MG tablet [id number] No Take 1 tablet (25 mg total) by mouth 2 (two) times daily. Clapacs, Jackquline Denmark, MD Taking Active Self, Pharmacy  Records           Med Note [facility], [facility]   Thu Jul 24, 2022  1:22 PM) Patient states that she has been off this medication for a while but is currently back taking this medication. Last admin yesterday 07/23/2022.   mirtazapine (REMERON) 15 MG tablet [id number]  Take 2 tablets (30 mg total) by mouth at bedtime. Shanna Cisco, NP  Active   NOVOLOG FLEXPEN 100 UNIT/ML FlexPen [id number]  INJECT 15 UNITS SUBCUTANEOUSLY WITH LUNCH AND INJECT 20 UNITS SUBCUTANEOUSLY WITH SUPPER Sabino Dick, DO  Active   oxyCODONE (ROXICODONE) 5 MG immediate release tablet [id number] No Take 1 tablet (5 mg total) by mouth every 4 (four) hours as needed for severe pain.  Patient not taking: Reported on 07/29/2022   Henderly, Britni A, PA-C Not Taking Active   rosuvastatin (CRESTOR) 20 MG tablet [id number] No Take 1 tablet (20 mg total) by mouth daily. Clapacs, Jackquline Denmark, MD Taking Active Self, Pharmacy Records  Semaglutide, 2 MG/DOSE, (OZEMPIC, 2 MG/DOSE,) 8 MG/3ML [facility] [id number]  Inject 2 mg into the skin once per week. Sabino Dick, DO  Active             Patient Active Problem List   Diagnosis Date Noted   Sciatica associated with disorder of lumbar spine 07/09/2022   Sciatica 07/08/2022   Acute left-sided low back pain with left-sided sciatica 07/08/2022   Stomach cramps 03/12/2022   Severe recurrent major depression without psychotic features (HCC) 02/24/2022   Hyperkalemia 02/23/2022   Suicide attempt (HCC) 02/22/2022   Acute-on-chronic kidney injury (HCC) 02/22/2022   Drug overdose 02/20/2022   Onychomycosis 10/01/2021   SVT (supraventricular tachycardia) 05/28/2021   Obesity hypoventilation syndrome (HCC) 05/28/2021   CHF (congestive heart failure) (HCC)    Multifocal pneumonia 04/25/2021   Severe sepsis (HCC)    Healthcare maintenance 04/15/2021   Asthma 12/18/2020   Vaginal irritation 09/28/2020   Mild depression 08/30/2020   Arthritis of right ankle 07/25/2020   Right knee  pain 06/21/2020   Sinus pain 03/01/2020   Bleeding nose 03/01/2020   MDD (major depressive disorder) 11/21/2019   Moderate episode of recurrent major depressive disorder (HCC) 09/22/2019   Generalized anxiety disorder 09/22/2019   Bipolar I disorder, most recent episode depressed (HCC) 09/22/2019   Bilateral knee pain 03/14/2019   Osteoarthritis 11/24/2018   Bipolar disorder (HCC) 04/29/2017   HLD (hyperlipidemia) 03/19/2017   Asthma exacerbation 03/14/2017   Weight gain 12/06/2016   MDD (major depressive disorder), recurrent severe, 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   Dyspnea 05/09/2015   HTN (hypertension)    Vitamin D deficiency 04/11/2015   Fatigue 04/10/2015   Non compliance w medication regimen 04/10/2015   Adjustment disorder with mixed anxiety and depressed mood    Cough 09/20/2014   Grief at loss of child 06/22/2014   Dysmenorrhea 11/25/2013   Suicidal ideation 05/27/2011   Chronic kidney disease (CKD) 11/06/2010   Abdominal pain 01/14/2010   ENDOMETRIAL HYPERPLASIA UNSPECIFIED 02/07/2008   MENORRHAGIA 01/03/2008   Allergic rhinitis 06/24/2007   Essential hypertension, benign 02/03/2007   Hypothyroidism 05/07/2006   T2DM (type 2 diabetes mellitus) (HCC) 05/07/2006   Morbid obesity (HCC) 05/07/2006   Iron deficiency anemia 05/07/2006   Anxiety 05/07/2006   Obsessive-compulsive disorder 05/07/2006    Conditions to be addressed/monitored per PCP order:   community resources  There are no care plans that you recently modified to display for this patient.   Follow up:  Patient agrees to Care Plan and Follow-up.  Plan: The Managed Medicaid care management team will reach out to the patient again over the next 15 days.  Date/time of next scheduled Social Work care management/care coordination outreach:  09/09/22  Gus Puma, Kenard Gower, [facility]  Managed [facility] Social Worker [phone]

## 2022-08-19 NOTE — Telephone Encounter (Signed)
Patient informed Clinical research associate that she is in excruciating pain.  Her primary care reached out to writer and notes that he is agreeable with switching Prozac to duloxetine to help manage pain as well as anxiety and depression.  Provider discussed the risk and benefits of being on 2 antidepressants.  She endorsed understanding and notes that she has not had side effects with Prozac and mirtazapine.  Patient informed that his sleep is very important to her and at this time does not want to discontinue mirtazapine.  She does however reports that she would be willing to discontinue Prozac.  Provider informed patient to take one 40 mg tablet daily instead of two for a week.  Provider also informed patient to take take Prozac 40 mg every other day for a few days prior to discontinuing it.  She will cross taper with Cymbalta 20 mg and continue mirtazapine as prescribed.  Provider instructed patient to call clinic if depression worsens and informed her that Cymbalta could be titrated up when she has successfully discontinued Prozac.  She endorsed understanding and agreed. Potential side effects of medication and risks vs benefits of treatment vs non-treatment were explained and discussed. All questions were answered. No other concerns noted at this time.

## 2022-08-19 NOTE — Patient Instructions (Signed)
Visit Information  The Patient                                              was given information about Medicaid Managed Care team care coordination services and consented to engagement with the [facility] Managed Care team.   Social Worker will follow up in 15 days.   Abelino Derrick, MHA [facility]  Managed [facility] Social Worker [phone]

## 2022-08-20 ENCOUNTER — Ambulatory Visit: Payer: BC Managed Care – PPO | Attending: Family Medicine | Admitting: Physical Therapy

## 2022-08-20 ENCOUNTER — Encounter: Payer: Self-pay | Admitting: Physical Therapy

## 2022-08-20 DIAGNOSIS — M5442 Lumbago with sciatica, left side: Secondary | ICD-10-CM | POA: Diagnosis present

## 2022-08-20 DIAGNOSIS — M6281 Muscle weakness (generalized): Secondary | ICD-10-CM | POA: Diagnosis present

## 2022-08-20 DIAGNOSIS — M25561 Pain in right knee: Secondary | ICD-10-CM | POA: Insufficient documentation

## 2022-08-20 DIAGNOSIS — M25571 Pain in right ankle and joints of right foot: Secondary | ICD-10-CM | POA: Diagnosis present

## 2022-08-20 DIAGNOSIS — R2689 Other abnormalities of gait and mobility: Secondary | ICD-10-CM | POA: Diagnosis present

## 2022-08-20 DIAGNOSIS — G8929 Other chronic pain: Secondary | ICD-10-CM | POA: Insufficient documentation

## 2022-08-20 DIAGNOSIS — R2681 Unsteadiness on feet: Secondary | ICD-10-CM | POA: Insufficient documentation

## 2022-08-20 NOTE — Therapy (Signed)
OUTPATIENT PHYSICAL THERAPY TREATMENT NOTE   Patient Name: [name] MRN: [id number] DOB:[birth year], 52 y.o., female Today's Date: 08/20/2022  END OF SESSION:  PT End of Session - 08/20/22 1451     Visit Number 3    Number of Visits 17    Date for PT Re-Evaluation 09/24/22    Authorization Type [employer]    PT Start Time 0245    PT Stop Time 0330    PT Time Calculation (min) 45 min              Past Medical History:  Diagnosis Date   Acute renal failure (HCC) 05/27/10   hemodialysis for 6 weeks   Anxiety    Asthma    Depression    Diabetes mellitus    Hypertension    Rhabdomyolysis 06/06/10   after drug overdose   Suicide attempt by drug ingestion (HCC) 06/05/10   result rhabdomyolosis and ARF requrining dialysis    Past Surgical History:  Procedure Laterality Date   ANKLE ARTHROSCOPY Right 08/17/2015   Procedure: RIGHT ANKLE ARTHROSCOPY WITH SYNOVECTOMY AND LOOSE BODY EXCISION;  Surgeon: Marcene Corning, MD;  Location: [facility] OR;  Service: Orthopedics;  Laterality: Right;   CHOLECYSTECTOMY     TUBAL LIGATION  1998   Patient Active Problem List   Diagnosis Date Noted   Sciatica associated with disorder of lumbar spine 07/09/2022   Sciatica 07/08/2022   Acute left-sided low back pain with left-sided sciatica 07/08/2022   Stomach cramps 03/12/2022   Severe recurrent major depression without psychotic features (HCC) 02/24/2022   Hyperkalemia 02/23/2022   Suicide attempt (HCC) 02/22/2022   Acute-on-chronic kidney injury (HCC) 02/22/2022   Drug overdose 02/20/2022   Onychomycosis 10/01/2021   SVT (supraventricular tachycardia) 05/28/2021   Obesity hypoventilation syndrome (HCC) 05/28/2021   CHF (congestive heart failure) (HCC)    Multifocal pneumonia 04/25/2021   Severe sepsis (HCC)    Healthcare maintenance 04/15/2021   Asthma 12/18/2020   Vaginal irritation 09/28/2020   Mild depression 08/30/2020   Arthritis of right ankle 07/25/2020   Right knee pain 06/21/2020    Sinus pain 03/01/2020   Bleeding nose 03/01/2020   MDD (major depressive disorder) 11/21/2019   Moderate episode of recurrent major depressive disorder (HCC) 09/22/2019   Generalized anxiety disorder 09/22/2019   Bipolar I disorder, most recent episode depressed (HCC) 09/22/2019   Bilateral knee pain 03/14/2019   Osteoarthritis 11/24/2018   Bipolar disorder (HCC) 04/29/2017   HLD (hyperlipidemia) 03/19/2017   Asthma exacerbation 03/14/2017   Weight gain 12/06/2016   MDD (major depressive disorder), recurrent severe, 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   Dyspnea 05/09/2015   HTN (hypertension)    Vitamin D deficiency 04/11/2015   Fatigue 04/10/2015   Non compliance w medication regimen 04/10/2015   Adjustment disorder with mixed anxiety and depressed mood    Cough 09/20/2014   Grief at loss of child 06/22/2014   Dysmenorrhea 11/25/2013   Suicidal ideation 05/27/2011   Chronic kidney disease (CKD) 11/06/2010   Abdominal pain 01/14/2010   ENDOMETRIAL HYPERPLASIA UNSPECIFIED 02/07/2008   MENORRHAGIA 01/03/2008   Allergic rhinitis 06/24/2007   Essential hypertension, benign 02/03/2007   Hypothyroidism 05/07/2006   T2DM (type 2 diabetes mellitus) (HCC) 05/07/2006   Morbid obesity (HCC) 05/07/2006   Iron deficiency anemia 05/07/2006   Anxiety 05/07/2006   Obsessive-compulsive disorder 05/07/2006    PCP: Sabino Dick, DO  REFERRING PROVIDER: Latrelle Dodrill, MD  REFERRING DIAG: [phone] (ICD-10-CM) - Left sided sciatica   Rationale  for Evaluation and Treatment: Rehabilitation  THERAPY DIAG:  Low back pain with left-sided sciatica, unspecified back pain laterality, unspecified chronicity  Other abnormalities of gait and mobility  ONSET DATE: mid March 2024  SUBJECTIVE:                                                                                                                                                                                          Per eval -  Pt endorses history of R ankle pain and L knee pain. States over the past year she has lost 60lbs primarily through dieting. Notes that in March 2024 she began to develop "a crick" in her R hip which gradually moved to L hip, then began to develop sharp/shooting pains down LLE into toes. Pt has had extensive medical work up with imaging/lab work (refer to [employer] for details), states she has been diagnosed with sciatica. Now having to use an [facility] for ambulation, requiring extensive rest breaks with navigation around home. 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  Difficulty and increased time with ADLs/housework. Feels weak, off balance, and that her leg is asleep. Also has some numbness in R toes on occasion.    SUBJECTIVE STATEMENT: 08/20/2022 Pt arrives w/ report of continued high pain levels. States she has done her HEP most days and it has not irritated symptoms. Continues to endorse stress over pain levels although denies any acute concerns re: mental health, encouragement today to discuss with PCP and mental health provider which pt verbalizes agreement with  PERTINENT HISTORY:  HTN, CHF, SVT, asthma, DM2, CKD, OCD, depression/anxiety  PAIN:  Are you having pain: 9/10 Location/description: low back LLE posteriorly/laterally into toes  Per eval -  Best-worst over past week: 8-10000/10  - aggravating factors: lower body dressing, walking, standing <93min, transfers - Easing factors: lying down (on left side), heating pad, steroid    PRECAUTIONS: cardiac hx, psych-soc hx  WEIGHT BEARING RESTRICTIONS: No  FALLS:  Has patient fallen in last 6 months? 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, greatest at at L4-L5 where there are bilateral facet joint effusions with  perifacet inflammatory change/arthropathy and mild bilateral foraminal stenosis."  PATIENT SURVEYS:  FOTO 16 current, 42 predicted  SCREENING FOR RED FLAGS: Red flag questioning/screening reassuring, pt has had recent MRI and extensive medical workup (refer to EPIC for details)  COGNITION: Overall cognitive status: Within functional limits for tasks assessed     SENSATION: Light touch intact BLE although diminished L5 and S1 on LLE   MUSCLE LENGTH: NT  POSTURE: reduced lordosis, mild shift towards R  PALPATION: Deferred given time constraints  LUMBAR ROM:   AROM eval  Flexion 100% (seated, painless)  Extension 75% seated, painful with provocation of LLE symptoms  Right lateral flexion   Left lateral flexion   Right rotation 100% painless    Left rotation 100% painless   (Blank rows = not tested)  LOWER EXTREMITY ROM:     Active  Right eval Left eval  Hip flexion    Hip extension    Hip internal rotation    Hip external rotation    Knee extension    Knee flexion    (Blank rows = not tested) (Key: WFL = within functional limits not formally assessed, * = concordant pain, s = stiffness/stretching sensation, NT = not tested)  Comments:    LOWER EXTREMITY MMT:    MMT Right eval Left eval  Hip flexion    Hip abduction (modified sitting)    Hip internal rotation    Hip external rotation    Knee flexion    Knee extension    Ankle dorsiflexion     (Blank rows = not tested) (Key: WFL = within functional limits not formally assessed, * = concordant pain, s = stiffness/stretching sensation, NT = not tested)  Comments: deferred given symptom irritability  LUMBAR SPECIAL  TESTS:  + slump test LLE, negative on R  FUNCTIONAL TESTS:  Sit to stand: increase in pain, heavy B UE support from surface, trunk lean to R, wide BOS, reduced fwd trunk lean  GAIT: Distance walked: within clinic Assistive device utilized: Single point cane Level of assistance: Modified independence Comments: antalgic gait LLE, reduced gait speed/cadence, wide BOS, limited trunk rotation  TODAY'S TREATMENT:                                                                                                                              [facility] Adult PT Treatment:                                                DATE: 08/20/22 Therapeutic Exercise: Modified nerve glide 3x10 (heel/toe raises) performed throughout for symptom modification LTR PPT Ball squeeze with ab draw in  Supine assisted left LE nerve glide - ankle pump  Standing lumbar extension x 1 rep- increased buttock and leg pain  Seated lumbar flexion 10 sec x 3 -  no increased pain Seated Lumbar rotation - increased leg sx Seated PPT - increased leg sx Seated clam - red band 10 x 2   Self Care: Tennis ball  self TPR gluteal hooklying x 2 minutes   [facility] Adult PT Treatment:                                                DATE: 08/05/22 Therapeutic Exercise: Modified nerve glide 3x10 (heel/toe raises) performed throughout for symptom modification Seated adduction iso 2x8 cues for form and breath control  Seated pelvic tilt 2x8 cues for comfortable ROM and breath control  Seated trunk rotation x10 BIL  HEP update + education/handout  Therapeutic Activity: Significant time spent w/ education on symptom behavior, activity modification, rationale for interventions, role of PT, relevant anatomy/physiology, reasonable expectations/prognosis re: course of care as it relates to activity tolerance and appropriate reassurance, communication w/ providers re: pain management strategies as it pertains to ADLs/mobility   [facility] Adult PT Treatment:                                                 DATE: 07/30/22 Therapeutic Exercise: Modified sciatic nerve glides LLE (heel/toe raises given symptom irritability w/ testing position) x8 Seated trunk rotation BIL x8 HEP handout + education on safety and appropriate performance   PATIENT EDUCATION:  Education details: rationale for intervention, activity modification, HEP  Person educated: Patient Education method: Explanation, Demonstration, Tactile cues, Verbal cues, and Handouts Education comprehension: verbalized understanding, returned demonstration, verbal cues required, tactile cues required, and needs further education    HOME EXERCISE PROGRAM: Access Code: [email or web address] URL: [email or web address] Date: 08/05/2022 Prepared by: Fransisco Hertz  Exercises - Seated Heel Toe Raises  - 1 x daily - 7 x weekly - 3 sets - 8 reps - Seated Trunk Rotation - Arms Crossed  - 1 x daily - 7 x weekly - 3 sets - 8 reps - Seated Hip Adduction Isometrics with Ball  - 1 x daily - 7 x weekly - 3 sets - 8 reps Added - seated clam with resistance band  - 1 x daily - 7 x weekly - 3 sets - 8 reps - 5 hold - Supine Lower Trunk Rotation  - 1 x daily - 7 x weekly - 3 sets - 8 reps - 5 hold7  ASSESSMENT:  CLINICAL IMPRESSION: 08/20/2022 Pt arrives w/ report of 8/10 pain, feels like she is improving. She experienced increased buttock and leg pain with the walk into the gym area today. She reports positive response from modified nerve glide and this was used intermittently throughout session. Able to complete therex in supine position today with fair tolerance. She reported feeling a "rock" under her left buttock in supine. She did well with LTR. Began seated clams with good tolerance. She was open to updated HEP and was given LTR and seated clams to try. Cautioned to avoid aggravation of pain. At end of session she reported feeling okay.      Per eval - Pt is a very pleasant 52 year old woman who  arrives to PT evaluation on this date for low back pain. Of note, pt was admitted to [facility] for this issue from 07/08/22-07/09/22 and  left AMA, returned to ED on 07/12/22; pt followed up with family medicine on 07/14/22 and 07/30/22, their notes indicate plan to proceed with PT (please refer to [facility] as appropriate). Pt endorses gradual onset of symptoms in March 2024, now limiting ambulation, household activities, and ability to work. Exam is limited by symptom irritability although slump test on LLE is markedly positive, lumbar ROM mildly limited with extension worsening LLE symptoms. Increased time spent w/ discussion/education as pt endorses increased life stressors along with her pain, states she is following with providers for this. Despite symptom irritability on exam, pt tolerates HEP without increase in pain, extensive education on appropriate performance and modifying according to symptom response. Recommend skilled PT to address pain levels, lumbopelvic mobility/strength, and functional mobility. No adverse events, pt departs today's session in no acute distress, all voiced questions/concerns addressed appropriately from PT perspective.    OBJECTIVE IMPAIRMENTS: Abnormal gait, decreased activity tolerance, decreased balance, decreased endurance, decreased mobility, difficulty walking, decreased ROM, decreased strength, impaired perceived functional ability, improper body mechanics, postural dysfunction, and pain.   ACTIVITY LIMITATIONS: carrying, lifting, bending, sitting, standing, squatting, sleeping, stairs, transfers, bed mobility, bathing, toileting, dressing, hygiene/grooming, locomotion level, and caring for others  PARTICIPATION LIMITATIONS: meal prep, cleaning, laundry, driving, shopping, community activity, and occupation  PERSONAL FACTORS: Time since onset of injury/illness/exacerbation and 3+ comorbidities: HTN, DM, anxiety/depression  are also affecting patient's functional outcome.   REHAB  POTENTIAL: Fair given comorbidities and symptom severity  CLINICAL DECISION MAKING: Evolving/moderate complexity  EVALUATION COMPLEXITY: Moderate   GOALS: Goals reviewed with patient? No  SHORT TERM GOALS: Target date: 08/27/2022 Pt will demonstrate appropriate understanding and performance of initially prescribed HEP in order to facilitate improved independence with management of symptoms.  Baseline: HEP provided on eval Goal status: INITIAL   2. Pt will score greater than or equal to 25 on FOTO in order to demonstrate improved perception of function due to symptoms.  Baseline: 16  Goal status: INITIAL    LONG TERM GOALS: Target date: 09/24/2022 Pt will score 42 on FOTO in order to demonstrate improved perception of functional status due to symptoms.  Baseline: 16 Goal status: INITIAL  2.  Pt will demonstrate full lumbar extension AROM without provocation of LE symptoms in order to demonstrate improved tolerance to functional movement patterns.  Baseline: see ROM chart above Goal status: INITIAL  3.  Pt will report/demonstrate ability to stand/walk for up to with LRAD and less than 2 pt increase in pain in order to facilitate improved tolerance to household/work tasks. Baseline: unable to tolerate standing <31min w SPC Goal status: INITIAL  4. Pt will perform 5 consecutive sit to stands with min UE support and less than 2 pt increase in resting pain in order to improve safety w/ transfers.   Baseline: 1 STS, altered mechanics, increase in pain  Goal status: INITIAL   5. Pt will be able to safely navigate 1 flight of stairs with LRAD and unilat rail, less than 2 pt increase in pain on NPS in order to facilitate improved tolerance to home navigation.  Baseline: increased pain and extensive rest breaks with stair navigation at home  Goal status: INITIAL  PLAN:  PT FREQUENCY: 2x/week  PT DURATION: 8 weeks  PLANNED INTERVENTIONS: Therapeutic exercises, Therapeutic  activity, Neuromuscular re-education, Balance training, Gait training, Patient/Family education, Self Care, Joint mobilization, Stair training, Aquatic Therapy, Dry Needling, Spinal mobilization, Cryotherapy, Moist heat, Taping, Manual therapy, and Re-evaluation.  PLAN FOR NEXT SESSION: Review/update  HEP PRN. Work on [employer] exercises as appropriate with emphasis on gentle lumbar mobility, nerve desensitization, and hip activation. Symptom modification strategies as indicated/appropriate.     Jannette Spanner, PTA 08/20/22 3:40 PM Phone: [phone] Fax: [phone]

## 2022-08-20 NOTE — Telephone Encounter (Signed)
Patient called, LVM and informed that forms are ready for pick up. Copy made and placed in batch scanning. Original placed at front desk for pick up.   Younes Degeorge C Corrigan Kretschmer, RN  

## 2022-08-26 ENCOUNTER — Encounter: Payer: Self-pay | Admitting: Physical Therapy

## 2022-08-26 ENCOUNTER — Ambulatory Visit: Payer: BC Managed Care – PPO | Admitting: Physical Therapy

## 2022-08-26 DIAGNOSIS — M6281 Muscle weakness (generalized): Secondary | ICD-10-CM

## 2022-08-26 DIAGNOSIS — R2681 Unsteadiness on feet: Secondary | ICD-10-CM

## 2022-08-26 DIAGNOSIS — R2689 Other abnormalities of gait and mobility: Secondary | ICD-10-CM

## 2022-08-26 DIAGNOSIS — M5442 Lumbago with sciatica, left side: Secondary | ICD-10-CM

## 2022-08-26 NOTE — Therapy (Signed)
OUTPATIENT PHYSICAL THERAPY TREATMENT NOTE   Patient Name: [name] MRN: [id number] DOB:15-Jul-1970, 52 y.o., female Today's Date: 08/28/2022  END OF SESSION:  PT End of Session - 08/28/22 1021     Visit Number 5    Number of Visits 17    Date for PT Re-Evaluation 09/24/22    Authorization Type BCBS               Past Medical History:  Diagnosis Date   Acute renal failure (HCC) 05/27/10   hemodialysis for 6 weeks   Anxiety    Asthma    Depression    Diabetes mellitus    Hypertension    Rhabdomyolysis 06/06/10   after drug overdose   Suicide attempt by drug ingestion (HCC) 06/05/10   result rhabdomyolosis and ARF requrining dialysis    Past Surgical History:  Procedure Laterality Date   ANKLE ARTHROSCOPY Right 08/17/2015   Procedure: RIGHT ANKLE ARTHROSCOPY WITH SYNOVECTOMY AND LOOSE BODY EXCISION;  Surgeon: Marcene Corning, MD;  Location: [facility] OR;  Service: Orthopedics;  Laterality: Right;   CHOLECYSTECTOMY     TUBAL LIGATION  1998   Patient Active Problem List   Diagnosis Date Noted   Sciatica associated with disorder of lumbar spine 07/09/2022   Sciatica 07/08/2022   Acute left-sided low back pain with left-sided sciatica 07/08/2022   Stomach cramps 03/12/2022   Severe recurrent major depression without psychotic features (HCC) 02/24/2022   Hyperkalemia 02/23/2022   Suicide attempt (HCC) 02/22/2022   Acute-on-chronic kidney injury (HCC) 02/22/2022   Drug overdose 02/20/2022   Onychomycosis 10/01/2021   SVT (supraventricular tachycardia) 05/28/2021   Obesity hypoventilation syndrome (HCC) 05/28/2021   CHF (congestive heart failure) (HCC)    Multifocal pneumonia 04/25/2021   Severe sepsis (HCC)    Healthcare maintenance 04/15/2021   Asthma 12/18/2020   Vaginal irritation 09/28/2020   Mild depression 08/30/2020   Arthritis of right ankle 07/25/2020   Right knee pain 06/21/2020   Sinus pain 03/01/2020   Bleeding nose 03/01/2020   MDD (major depressive  disorder) 11/21/2019   Moderate episode of recurrent major depressive disorder (HCC) 09/22/2019   Generalized anxiety disorder 09/22/2019   Bipolar I disorder, most recent episode depressed (HCC) 09/22/2019   Bilateral knee pain 03/14/2019   Osteoarthritis 11/24/2018   Bipolar disorder (HCC) 04/29/2017   HLD (hyperlipidemia) 03/19/2017   Asthma exacerbation 03/14/2017   Weight gain 12/06/2016   MDD (major depressive disorder), recurrent severe, 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   Dyspnea 05/09/2015   HTN (hypertension)    Vitamin D deficiency 04/11/2015   Fatigue 04/10/2015   Non compliance w medication regimen 04/10/2015   Adjustment disorder with mixed anxiety and depressed mood    Cough 09/20/2014   Grief at loss of child 06/22/2014   Dysmenorrhea 11/25/2013   Suicidal ideation 05/27/2011   Chronic kidney disease (CKD) 11/06/2010   Abdominal pain 01/14/2010   ENDOMETRIAL HYPERPLASIA UNSPECIFIED 02/07/2008   MENORRHAGIA 01/03/2008   Allergic rhinitis 06/24/2007   Essential hypertension, benign 02/03/2007   Hypothyroidism 05/07/2006   T2DM (type 2 diabetes mellitus) (HCC) 05/07/2006   Morbid obesity (HCC) 05/07/2006   Iron deficiency anemia 05/07/2006   Anxiety 05/07/2006   Obsessive-compulsive disorder 05/07/2006    PCP: Sabino Dick, DO  REFERRING PROVIDER: Latrelle Dodrill, MD  REFERRING DIAG: [id number] (ICD-10-CM) - Left sided sciatica   Rationale for Evaluation and Treatment: Rehabilitation  THERAPY DIAG:  No diagnosis found.  ONSET DATE: mid March 2024  SUBJECTIVE:  SUBJECTIVE STATEMENT: 08/28/2022 Pt arrives w/ report of continued high pain levels less than 2 days ago.  8/10 pain in L hip.  I was able to walk without a cane from the car for the first time today to  clinic.     Pt states she almost cancelled appt due to pain.  She has to return to work on August 19th when school begins. She really wants to work and have more appt  on the schedule but has none right now.  I have been doing my exercises  Per eval - Pt endorses history of R ankle pain and L knee pain. States over the past year she has lost 60lbs primarily through dieting. Notes that in March 2024 she began to develop "a crick" in her R hip which gradually moved to L hip, then began to develop sharp/shooting pains down LLE into toes. Pt has had extensive medical work up with imaging/lab work (refer to [employer] for details), states she has been diagnosed with sciatica. Now having to use an [facility] for ambulation, requiring extensive rest breaks with navigation around home. 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  Difficulty and increased time with ADLs/housework. Feels weak, off balance, and that her leg is asleep. Also has some numbness in R toes on occasion.   PERTINENT HISTORY:  HTN, CHF, SVT, asthma, DM2, CKD, OCD, depression/anxiety  PAIN:  Are you having pain: 9/10 Location/description: low back LLE posteriorly/laterally into toes  Per eval -  Best-worst over past week: 8-10000/10  - aggravating factors: lower body dressing, walking, standing <27min, transfers - Easing factors: lying down (on left side), heating pad, steroid    PRECAUTIONS: cardiac hx, psych-soc hx  WEIGHT BEARING RESTRICTIONS: No  FALLS:  Has patient fallen in last 6 months? 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"  PATIENT SURVEYS:  FOTO 16 current, 42 predicted  SCREENING FOR RED FLAGS: Red flag questioning/screening reassuring, pt has had recent MRI and extensive medical workup (refer to EPIC for details)  COGNITION: Overall cognitive status: Within functional limits for tasks assessed     SENSATION: Light touch intact BLE although diminished L5 and S1 on LLE   MUSCLE LENGTH: NT  POSTURE: reduced lordosis, mild shift towards R  PALPATION: Deferred given time constraints  LUMBAR ROM:   AROM eval  Flexion 100% (seated, painless)  Extension 75% seated, painful with provocation of LLE symptoms  Right lateral flexion   Left lateral flexion   Right rotation 100% painless    Left rotation 100% painless   (Blank rows = not tested)  LOWER EXTREMITY ROM:     Active  Right eval Left eval  Hip flexion    Hip extension    Hip internal rotation    Hip external rotation    Knee extension    Knee flexion    (Blank  rows = not tested) (Key: WFL = within functional limits not formally assessed, * = concordant pain, s = stiffness/stretching sensation, NT = not tested)  Comments:    LOWER EXTREMITY MMT:    MMT Right eval Left eval  Hip flexion    Hip abduction (modified sitting)    Hip internal rotation    Hip external rotation    Knee flexion    Knee extension    Ankle dorsiflexion     (Blank rows = not tested) (Key: WFL = within functional limits not formally assessed, * = concordant pain, s = stiffness/stretching sensation, NT = not tested)  Comments: deferred given symptom irritability  LUMBAR SPECIAL TESTS:  + slump test LLE, negative on R  FUNCTIONAL TESTS:  Sit to stand: increase in pain, heavy B UE  support from surface, trunk lean to R, wide BOS, reduced fwd trunk lean 08-28-22  5 X STS 37.91 sec  GAIT: Distance walked: within clinic Assistive device utilized: Single point cane Level of assistance: Modified independence Comments: antalgic gait LLE, reduced gait speed/cadence, wide BOS, limited trunk rotation  TODAY'S TREATMENT:    [facility] Adult PT Treatment:                                                DATE: 08-28-22 Therapeutic Exercise: Attempted back extension next to counter but increased pain in L foot so DC Returned to flexion based exercises PPT Supine LTR Hip ADD with ball 1/2 bridge with ball Leg press into mat Leg lengthener for R and L LE  STS x 5 Manual Therapy: LAD of LE initially to decrease high pain in L LE and Low back STW to L Quadratus Lumborum and L buttock Trigger Point Dry-Needling performed   by Garen Lah Treatment instructions: Expect mild to moderate muscle soreness. S/S of pneumothorax if dry needled over a lung field, and to seek immediate medical attention should they occur. Patient verbalized understanding of these instructions and education.  Patient Consent Given: Yes Education handout provided: Previously provided Muscles treated:  L QL and L piriformis and gluteals Electrical stimulation performed: No Parameters: N/A Treatment response/outcome: twitch response noted, pt noted relief  Modalities: Moist hot pack  [facility] Adult PT Treatment:                                                DATE: 08-26-22 Therapeutic Exercise: PPT Supine LTR Hip ADD with ball 1/2 bridge with ball Leg press into mat Leg lengthener for R and L LE Attempted standing back extension but increased pain into Left buttock and DC Returned to PPT, and leg press and leg lengthener for R and L LE. Manual Therapy: LAD of LE initially to decrease high pain in L LE and Low back STW to L Quadratus Lumborum and L buttock Trigger Point Dry-Needling performed   by Garen Lah Treatment instructions: Expect mild to moderate muscle soreness. S/S of pneumothorax if dry needled over a lung field, and to seek immediate medical attention should they occur. Patient verbalized understanding of these instructions and education.  Patient Consent Given: Yes Education handout provided: Previously provided Muscles treated:  L QL and L piriformis and gluteals Electrical stimulation performed: No Parameters:  N/A Treatment response/outcome: twitch response noted, pt noted relief                                                                                                                         [facility] Adult PT Treatment:                                                DATE: 08/20/22 Therapeutic Exercise: Modified nerve glide 3x10 (heel/toe raises) performed throughout for symptom modification LTR PPT Ball squeeze with ab draw in  Supine assisted left LE nerve glide - ankle pump  Standing lumbar extension x 1 rep- increased buttock and leg pain  Seated lumbar flexion 10 sec x 3 - no increased pain Seated Lumbar rotation - increased leg sx Seated PPT - increased leg sx Seated clam - red band 10 x 2   Self Care: Tennis ball  self TPR gluteal hooklying x 2 minutes   [facility] Adult PT Treatment:                                                DATE: 08/05/22 Therapeutic Exercise: Modified nerve glide 3x10 (heel/toe raises) performed throughout for symptom modification Seated adduction iso 2x8 cues for form and breath control  Seated pelvic tilt 2x8 cues for comfortable ROM and breath control  Seated trunk rotation x10 BIL  HEP update + education/handout  Therapeutic Activity: Significant time spent w/ education on symptom behavior, activity modification, rationale for interventions, role of PT, relevant anatomy/physiology, reasonable expectations/prognosis re: course of care as it relates to activity tolerance and appropriate reassurance, communication w/ providers re:  pain management strategies as it pertains to ADLs/mobility   [facility] Adult PT Treatment:                                                DATE: 07/30/22 Therapeutic Exercise: Modified sciatic nerve glides LLE (heel/toe raises given symptom irritability w/ testing position) x8 Seated trunk rotation BIL x8 HEP handout + education on safety and appropriate performance   PATIENT EDUCATION:  Education details: rationale for intervention, activity modification, HEP  Person educated: Patient Education method: Explanation, Demonstration, Tactile cues, Verbal cues, and Handouts Education comprehension: verbalized understanding, returned demonstration, verbal cues required, tactile cues required, and needs further education    HOME EXERCISE PROGRAM: Access Code: [email or web address] URL: [email or web address] Date: 08/05/2022 Prepared by: Fransisco Hertz  Exercises - Seated Heel Toe Raises  - 1 x daily - 7 x weekly - 3 sets - 8 reps - Seated Trunk Rotation - Arms Crossed  - 1 x  daily - 7 x weekly - 3 sets - 8 reps - Seated Hip Adduction Isometrics with Ball  - 1 x daily - 7 x weekly - 3 sets - 8 reps Added - seated clam with resistance band  - 1 x daily - 7 x weekly - 3 sets - 8 reps - 5 hold - Supine Lower Trunk Rotation  - 1 x daily - 7 x weekly - 3 sets - 8 reps - 5 hold7  Added - Hooklying Single Knee to Chest Stretch  - 1 x daily - 7 x weekly - 3 sets - 10 reps - Supine Figure 4 Piriformis Stretch  - 1-3 x daily - 7 x weekly - 1 sets - 3-4 reps - 15-20 sec hold - Supine Piriformis Stretch with Leg Straight  - 1 x daily - 7 x weekly - 1 sets - 3-4 reps - 15-20-sec hold  Added 08-28-22 Program Notes Leg press into mat  2x R and 2 x L then do leg lengthener 2 x on R and 2 x on L as shown in clinic    ASSESSMENT:  CLINICAL IMPRESSION: 6-20 -24  Pt arrives with over 8/10 pain and not using a cane today from the parking lot to show improvement but pt still in pain and has difficulty walking  more than a few minutes.  Pt told to use cane for safety. . Pt given LAD on LE and consents TPDN and was closely monitored throughout session.  Pt remained a 8/10 throughout session and no difference with any exercise given today.  Pt was able to performa 5 x STS today.Pt was able to lie on back and perform exercise. Pt needed close supervision and guidance to complete exercises and movements.   Pt with slow cadence to clinic window to make appt and still with 8/10 pain at end of session.  Pt instructed to call MD if pain worsens or increasing radiation into ankle.     Pt arrives w/ report of 8/10 pain, feels like she is improving. She experienced increased buttock and leg pain with the walk into the gym area today. She reports positive response from modified nerve glide and this was used intermittently throughout session. Able to complete therex in supine position today with fair tolerance. She reported feeling a "rock" under her left buttock in supine. She did well with LTR. Began seated clams with good tolerance. She was open to updated HEP and was given LTR and seated clams to try. Cautioned to avoid aggravation of pain. At end of session she reported feeling okay.      Per eval - Pt is a very pleasant 52 year old woman who arrives to PT evaluation on this date for low back pain. Of note, pt was admitted to [facility] for this issue from 07/08/22-07/09/22 and left AMA, returned to ED on 07/12/22; pt followed up with family medicine on 07/14/22 and 07/30/22, their notes indicate plan to proceed with PT (please refer to [facility] as appropriate). Pt endorses gradual onset of symptoms in March 2024, now limiting ambulation, household activities, and ability to work. Exam is limited by symptom irritability although slump test on LLE is markedly positive, lumbar ROM mildly limited with extension worsening LLE symptoms. Increased time spent w/ discussion/education as pt endorses increased life stressors along with her pain,  states she is following with providers for this. Despite symptom irritability on exam, pt tolerates HEP without increase in pain, extensive education on appropriate performance and modifying  according to symptom response. Recommend skilled PT to address pain levels, lumbopelvic mobility/strength, and functional mobility. No adverse events, pt departs today's session in no acute distress, all voiced questions/concerns addressed appropriately from PT perspective.    OBJECTIVE IMPAIRMENTS: Abnormal gait, decreased activity tolerance, decreased balance, decreased endurance, decreased mobility, difficulty walking, decreased ROM, decreased strength, impaired perceived functional ability, improper body mechanics, postural dysfunction, and pain.   ACTIVITY LIMITATIONS: carrying, lifting, bending, sitting, standing, squatting, sleeping, stairs, transfers, bed mobility, bathing, toileting, dressing, hygiene/grooming, locomotion level, and caring for others  PARTICIPATION LIMITATIONS: meal prep, cleaning, laundry, driving, shopping, community activity, and occupation  PERSONAL FACTORS: Time since onset of injury/illness/exacerbation and 3+ comorbidities: HTN, DM, anxiety/depression  are also affecting patient's functional outcome.   REHAB POTENTIAL: Fair given comorbidities and symptom severity  CLINICAL DECISION MAKING: Evolving/moderate complexity  EVALUATION COMPLEXITY: Moderate   GOALS: Goals reviewed with patient? No  SHORT TERM GOALS: Target date: 08/27/2022 Pt will demonstrate appropriate understanding and performance of initially prescribed HEP in order to facilitate improved independence with management of symptoms.  Baseline: HEP provided on eval 08-28-22 Goal status: MET  2. Pt will score greater than or equal to 25 on FOTO in order to demonstrate improved perception of function due to symptoms.  Baseline: 16 08-28-22 33%  Goal status: MET  LONG TERM GOALS: Target date: 09/24/2022 Pt will  score 42 on FOTO in order to demonstrate improved perception of functional status due to symptoms.  Baseline: 16 Goal status: INITIAL  2.  Pt will demonstrate full lumbar extension AROM without provocation of LE symptoms in order to demonstrate improved tolerance to functional movement patterns.  Baseline: see ROM chart above Goal status: INITIAL  3.  Pt will report/demonstrate ability to stand/walk for up to with LRAD and less than 2 pt increase in pain in order to facilitate improved tolerance to household/work tasks. Baseline: unable to tolerate standing <36min w SPC Goal status: INITIAL  4. Pt will perform 5 consecutive sit to stands with min UE support and less than 2 pt increase in resting pain in order to improve safety w/ transfers.   Baseline: 1 STS, altered mechanics, increase in pain  Goal status: INITIAL   5. Pt will be able to safely navigate 1 flight of stairs with LRAD and unilat rail, less than 2 pt increase in pain on NPS in order to facilitate improved tolerance to home navigation.  Baseline: increased pain and extensive rest breaks with stair navigation at home  Goal status: INITIAL  PLAN:  PT FREQUENCY: 2x/week  PT DURATION: 8 weeks  PLANNED INTERVENTIONS: Therapeutic exercises, Therapeutic activity, Neuromuscular re-education, Balance training, Gait training, Patient/Family education, Self Care, Joint mobilization, Stair training, Aquatic Therapy, Dry Needling, Spinal mobilization, Cryotherapy, Moist heat, Taping, Manual therapy, and Re-evaluation.  PLAN FOR NEXT SESSION: Review/update HEP PRN. Work on [employer] exercises as appropriate with emphasis on gentle lumbar mobility, nerve desensitization, and hip activation. Symptom modification strategies as indicated/appropriate.    Garen Lah, PT, ATRIC Certified Exercise Expert for the Aging Adult  08/28/22 1:19 PM Phone: [phone] Fax: [phone]

## 2022-08-26 NOTE — Patient Instructions (Signed)

## 2022-08-26 NOTE — Therapy (Signed)
OUTPATIENT PHYSICAL THERAPY TREATMENT NOTE   Patient Name: [name] MRN: [id number] DOB:[birth year], 52 y.o., female Today's Date: 08/26/2022  END OF SESSION:  PT End of Session - 08/26/22 1021     Visit Number 4    Number of Visits 17    Date for PT Re-Evaluation 09/24/22    Authorization Type BCBS    PT Start Time 1017    PT Stop Time 1100    PT Time Calculation (min) 43 min    Activity Tolerance Patient limited by pain;No increased pain    Behavior During Therapy [facility] for tasks assessed/performed              Past Medical History:  Diagnosis Date   Acute renal failure (HCC) 05/27/10   hemodialysis for 6 weeks   Anxiety    Asthma    Depression    Diabetes mellitus    Hypertension    Rhabdomyolysis 06/06/10   after drug overdose   Suicide attempt by drug ingestion (HCC) 06/05/10   result rhabdomyolosis and ARF requrining dialysis    Past Surgical History:  Procedure Laterality Date   ANKLE ARTHROSCOPY Right 08/17/2015   Procedure: RIGHT ANKLE ARTHROSCOPY WITH SYNOVECTOMY AND LOOSE BODY EXCISION;  Surgeon: Marcene Corning, MD;  Location: [facility] OR;  Service: Orthopedics;  Laterality: Right;   CHOLECYSTECTOMY     TUBAL LIGATION  1998   Patient Active Problem List   Diagnosis Date Noted   Sciatica associated with disorder of lumbar spine 07/09/2022   Sciatica 07/08/2022   Acute left-sided low back pain with left-sided sciatica 07/08/2022   Stomach cramps 03/12/2022   Severe recurrent major depression without psychotic features (HCC) 02/24/2022   Hyperkalemia 02/23/2022   Suicide attempt (HCC) 02/22/2022   Acute-on-chronic kidney injury (HCC) 02/22/2022   Drug overdose 02/20/2022   Onychomycosis 10/01/2021   SVT (supraventricular tachycardia) 05/28/2021   Obesity hypoventilation syndrome (HCC) 05/28/2021   CHF (congestive heart failure) (HCC)    Multifocal pneumonia 04/25/2021   Severe sepsis (HCC)    Healthcare maintenance 04/15/2021   Asthma 12/18/2020    Vaginal irritation 09/28/2020   Mild depression 08/30/2020   Arthritis of right ankle 07/25/2020   Right knee pain 06/21/2020   Sinus pain 03/01/2020   Bleeding nose 03/01/2020   MDD (major depressive disorder) 11/21/2019   Moderate episode of recurrent major depressive disorder (HCC) 09/22/2019   Generalized anxiety disorder 09/22/2019   Bipolar I disorder, most recent episode depressed (HCC) 09/22/2019   Bilateral knee pain 03/14/2019   Osteoarthritis 11/24/2018   Bipolar disorder (HCC) 04/29/2017   HLD (hyperlipidemia) 03/19/2017   Asthma exacerbation 03/14/2017   Weight gain 12/06/2016   MDD (major depressive disorder), recurrent severe, 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   Dyspnea 05/09/2015   HTN (hypertension)    Vitamin D deficiency 04/11/2015   Fatigue 04/10/2015   Non compliance w medication regimen 04/10/2015   Adjustment disorder with mixed anxiety and depressed mood    Cough 09/20/2014   Grief at loss of child 06/22/2014   Dysmenorrhea 11/25/2013   Suicidal ideation 05/27/2011   Chronic kidney disease (CKD) 11/06/2010   Abdominal pain 01/14/2010   ENDOMETRIAL HYPERPLASIA UNSPECIFIED 02/07/2008   MENORRHAGIA 01/03/2008   Allergic rhinitis 06/24/2007   Essential hypertension, benign 02/03/2007   Hypothyroidism 05/07/2006   T2DM (type 2 diabetes mellitus) (HCC) 05/07/2006   Morbid obesity (HCC) 05/07/2006   Iron deficiency anemia 05/07/2006   Anxiety 05/07/2006   Obsessive-compulsive disorder 05/07/2006    PCP: Melba Coon,  Myrlene Broker, DO  REFERRING PROVIDER: Latrelle Dodrill, MD  REFERRING DIAG: [phone] (ICD-10-CM) - Left sided sciatica   Rationale for Evaluation and Treatment: Rehabilitation  THERAPY DIAG:  Low back pain with left-sided sciatica, unspecified back pain laterality, unspecified chronicity  Other abnormalities of gait and mobility  Unsteadiness on feet  Muscle weakness (generalized)  ONSET DATE: mid March 2024  SUBJECTIVE:                                                                                                                                                                                              SUBJECTIVE STATEMENT: 08/26/2022 Pt arrives w/ report of continued high pain levels.  Today is the worst over 10/10.  Pt barely able to walk into clinic with cane.  Pt states she almost cancelled appt due to pain.  She has to return to work on August 19th when school begins.   Pt can't believe she can't walk as well as I did in March. I cannot lie on my back.   Per eval - Pt endorses history of R ankle pain and L knee pain. States over the past year she has lost 60lbs primarily through dieting. Notes that in March 2024 she began to develop "a crick" in her R hip which gradually moved to L hip, then began to develop sharp/shooting pains down LLE into toes. Pt has had extensive medical work up with imaging/lab work (refer to [employer] for details), states she has been diagnosed with sciatica. Now having to use an [facility] for ambulation, requiring extensive rest breaks with navigation around home. 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  Difficulty and increased time with ADLs/housework. Feels weak, off balance, and that her leg is asleep. Also has some numbness in R toes on occasion.   PERTINENT HISTORY:  HTN, CHF, SVT, asthma, DM2, CKD, OCD, depression/anxiety  PAIN:  Are you having pain: 9/10 Location/description: low back LLE posteriorly/laterally into toes  Per eval -  Best-worst over past week: 8-10000/10  - aggravating factors: lower body dressing, walking, standing <74min, transfers - Easing factors: lying down (on left side), heating pad, steroid    PRECAUTIONS: cardiac hx, psych-soc hx  WEIGHT BEARING RESTRICTIONS: No  FALLS:  Has patient fallen in last 6 months? 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"  PATIENT SURVEYS:  FOTO 16 current, 42 predicted  SCREENING FOR RED FLAGS: Red flag questioning/screening reassuring, pt has had recent MRI and extensive medical workup (refer to EPIC for details)  COGNITION: Overall cognitive status: Within functional limits for tasks assessed     SENSATION: Light touch intact BLE although diminished L5 and S1 on LLE   MUSCLE LENGTH: NT  POSTURE: reduced lordosis, mild shift towards R  PALPATION: Deferred given time constraints  LUMBAR ROM:   AROM eval  Flexion 100% (seated, painless)  Extension 75% seated, painful with provocation of LLE symptoms  Right lateral flexion   Left lateral flexion   Right rotation 100% painless    Left rotation 100% painless   (Blank rows = not tested)  LOWER EXTREMITY ROM:     Active  Right eval Left eval  Hip flexion    Hip extension    Hip internal rotation    Hip external rotation    Knee extension    Knee flexion    (Blank rows = not tested) (Key: WFL = within functional limits not formally assessed, * = concordant pain, s = stiffness/stretching sensation, NT = not tested)  Comments:    LOWER EXTREMITY MMT:    MMT Right eval Left eval  Hip flexion    Hip abduction (modified sitting)    Hip internal rotation    Hip external rotation    Knee flexion    Knee extension    Ankle dorsiflexion     (Blank rows = not  tested) (Key: WFL = within functional limits not formally assessed, * = concordant pain, s = stiffness/stretching sensation, NT = not tested)  Comments: deferred given symptom irritability  LUMBAR SPECIAL TESTS:  + slump test LLE, negative on R  FUNCTIONAL TESTS:  Sit to stand: increase in pain, heavy B UE support from surface, trunk lean to R, wide BOS, reduced fwd trunk lean  GAIT: Distance walked: within clinic Assistive device utilized: Single point cane Level of assistance: Modified independence Comments: antalgic gait LLE, reduced gait speed/cadence, wide BOS, limited trunk rotation  TODAY'S TREATMENT:    [facility] Adult PT Treatment:                                                DATE: 08-26-22 Therapeutic Exercise: PPT Supine LTR Hip ADD with ball 1/2 bridge with ball Leg press into mat Leg lengthener for R and L LE Manual Therapy: LAD of LE initially to decrease high pain in L LE and Low back STW to L Quadratus Lumborum and L buttock Trigger Point Dry-Needling performed   by Garen Lah Treatment instructions: Expect mild to moderate muscle soreness. S/S of pneumothorax if dry needled over a lung field, and to seek immediate medical attention should they occur. Patient verbalized understanding of these instructions and education.  Patient Consent Given: Yes Education handout provided: Previously provided Muscles treated:  L QL and L piriformis and gluteals Electrical stimulation performed: No Parameters: N/A Treatment response/outcome: twitch response noted, pt noted  relief                                                                                                                         [facility] Adult PT Treatment:                                                DATE: 08/20/22 Therapeutic Exercise: Modified nerve glide 3x10 (heel/toe raises) performed throughout for symptom modification LTR PPT Ball squeeze with ab draw in  Supine assisted left LE nerve glide -  ankle pump  Standing lumbar extension x 1 rep- increased buttock and leg pain  Seated lumbar flexion 10 sec x 3 - no increased pain Seated Lumbar rotation - increased leg sx Seated PPT - increased leg sx Seated clam - red band 10 x 2   Self Care: Tennis ball  self TPR gluteal hooklying x 2 minutes   [facility] Adult PT Treatment:                                                DATE: 08/05/22 Therapeutic Exercise: Modified nerve glide 3x10 (heel/toe raises) performed throughout for symptom modification Seated adduction iso 2x8 cues for form and breath control  Seated pelvic tilt 2x8 cues for comfortable ROM and breath control  Seated trunk rotation x10 BIL  HEP update + education/handout  Therapeutic Activity: Significant time spent w/ education on symptom behavior, activity modification, rationale for interventions, role of PT, relevant anatomy/physiology, reasonable expectations/prognosis re: course of care as it relates to activity tolerance and appropriate reassurance, communication w/ providers re: pain management strategies as it pertains to ADLs/mobility   [facility] Adult PT Treatment:                                                DATE: 07/30/22 Therapeutic Exercise: Modified sciatic nerve glides LLE (heel/toe raises given symptom irritability w/ testing position) x8 Seated trunk rotation BIL x8 HEP handout + education on safety and appropriate performance   PATIENT EDUCATION:  Education details: rationale for intervention, activity modification, HEP  Person educated: Patient Education method: Explanation, Demonstration, Tactile cues, Verbal cues, and Handouts Education comprehension: verbalized understanding, returned demonstration, verbal cues required, tactile cues required, and needs further education    HOME EXERCISE PROGRAM: Access Code: [email or web address] URL: [email or web address] Date: 08/05/2022 Prepared by: Fransisco Hertz  Exercises - Seated Heel Toe Raises  - 1 x  daily - 7 x weekly - 3 sets - 8 reps - Seated Trunk Rotation - Arms Crossed  - 1 x daily - 7 x weekly - 3  sets - 8 reps - Seated Hip Adduction Isometrics with Ball  - 1 x daily - 7 x weekly - 3 sets - 8 reps Added - seated clam with resistance band  - 1 x daily - 7 x weekly - 3 sets - 8 reps - 5 hold - Supine Lower Trunk Rotation  - 1 x daily - 7 x weekly - 3 sets - 8 reps - 5 hold7  Added - Hooklying Single Knee to Chest Stretch  - 1 x daily - 7 x weekly - 3 sets - 10 reps - Supine Figure 4 Piriformis Stretch  - 1-3 x daily - 7 x weekly - 1 sets - 3-4 reps - 15-20 sec hold - Supine Piriformis Stretch with Leg Straight  - 1 x daily - 7 x weekly - 1 sets - 3-4 reps - 15-20-sec hold  ASSESSMENT:  CLINICAL IMPRESSION: 08-26-22  Pt arrives with over 10/10 pain and thought she might not attend today due to great pain and difficulty walking. Pt given LAD on LE and consents TPDN and was closely monitored throughout session.  Pt went from over 10/10 to 8/10 immediately after TPDN.  Pt then able to lie on back and perform HEP with close supervision. Pt at end of session 6-7/10 after HEP. Pt needed close supervision and guidance to complete exercises and movements.   Will continue to progress patient and educate on self care for back spasms.     Pt arrives w/ report of 8/10 pain, feels like she is improving. She experienced increased buttock and leg pain with the walk into the gym area today. She reports positive response from modified nerve glide and this was used intermittently throughout session. Able to complete therex in supine position today with fair tolerance. She reported feeling a "rock" under her left buttock in supine. She did well with LTR. Began seated clams with good tolerance. She was open to updated HEP and was given LTR and seated clams to try. Cautioned to avoid aggravation of pain. At end of session she reported feeling okay.      Per eval - Pt is a very pleasant 52 year old woman  who arrives to PT evaluation on this date for low back pain. Of note, pt was admitted to [facility] for this issue from 07/08/22-07/09/22 and left AMA, returned to ED on 07/12/22; pt followed up with family medicine on 07/14/22 and 07/30/22, their notes indicate plan to proceed with PT (please refer to [facility] as appropriate). Pt endorses gradual onset of symptoms in March 2024, now limiting ambulation, household activities, and ability to work. Exam is limited by symptom irritability although slump test on LLE is markedly positive, lumbar ROM mildly limited with extension worsening LLE symptoms. Increased time spent w/ discussion/education as pt endorses increased life stressors along with her pain, states she is following with providers for this. Despite symptom irritability on exam, pt tolerates HEP without increase in pain, extensive education on appropriate performance and modifying according to symptom response. Recommend skilled PT to address pain levels, lumbopelvic mobility/strength, and functional mobility. No adverse events, pt departs today's session in no acute distress, all voiced questions/concerns addressed appropriately from PT perspective.    OBJECTIVE IMPAIRMENTS: Abnormal gait, decreased activity tolerance, decreased balance, decreased endurance, decreased mobility, difficulty walking, decreased ROM, decreased strength, impaired perceived functional ability, improper body mechanics, postural dysfunction, and pain.   ACTIVITY LIMITATIONS: carrying, lifting, bending, sitting, standing, squatting, sleeping, stairs, transfers, bed mobility, bathing, toileting, dressing, hygiene/grooming,  locomotion level, and caring for others  PARTICIPATION LIMITATIONS: meal prep, cleaning, laundry, driving, shopping, community activity, and occupation  PERSONAL FACTORS: Time since onset of injury/illness/exacerbation and 3+ comorbidities: HTN, DM, anxiety/depression  are also affecting patient's functional outcome.   REHAB  POTENTIAL: Fair given comorbidities and symptom severity  CLINICAL DECISION MAKING: Evolving/moderate complexity  EVALUATION COMPLEXITY: Moderate   GOALS: Goals reviewed with patient? No  SHORT TERM GOALS: Target date: 08/27/2022 Pt will demonstrate appropriate understanding and performance of initially prescribed HEP in order to facilitate improved independence with management of symptoms.  Baseline: HEP provided on eval Goal status: INITIAL   2. Pt will score greater than or equal to 25 on FOTO in order to demonstrate improved perception of function due to symptoms.  Baseline: 16  Goal status: INITIAL    LONG TERM GOALS: Target date: 09/24/2022 Pt will score 42 on FOTO in order to demonstrate improved perception of functional status due to symptoms.  Baseline: 16 Goal status: INITIAL  2.  Pt will demonstrate full lumbar extension AROM without provocation of LE symptoms in order to demonstrate improved tolerance to functional movement patterns.  Baseline: see ROM chart above Goal status: INITIAL  3.  Pt will report/demonstrate ability to stand/walk for up to with LRAD and less than 2 pt increase in pain in order to facilitate improved tolerance to household/work tasks. Baseline: unable to tolerate standing <55min w SPC Goal status: INITIAL  4. Pt will perform 5 consecutive sit to stands with min UE support and less than 2 pt increase in resting pain in order to improve safety w/ transfers.   Baseline: 1 STS, altered mechanics, increase in pain  Goal status: INITIAL   5. Pt will be able to safely navigate 1 flight of stairs with LRAD and unilat rail, less than 2 pt increase in pain on NPS in order to facilitate improved tolerance to home navigation.  Baseline: increased pain and extensive rest breaks with stair navigation at home  Goal status: INITIAL  PLAN:  PT FREQUENCY: 2x/week  PT DURATION: 8 weeks  PLANNED INTERVENTIONS: Therapeutic exercises, Therapeutic  activity, Neuromuscular re-education, Balance training, Gait training, Patient/Family education, Self Care, Joint mobilization, Stair training, Aquatic Therapy, Dry Needling, Spinal mobilization, Cryotherapy, Moist heat, Taping, Manual therapy, and Re-evaluation.  PLAN FOR NEXT SESSION: Review/update HEP PRN. Work on [employer] exercises as appropriate with emphasis on gentle lumbar mobility, nerve desensitization, and hip activation. Symptom modification strategies as indicated/appropriate.    Garen Lah, PT, ATRIC Certified Exercise Expert for the Aging Adult  08/26/22 12:58 PM Phone: [phone] Fax: [phone]

## 2022-08-28 ENCOUNTER — Encounter: Payer: Self-pay | Admitting: Physical Therapy

## 2022-08-28 ENCOUNTER — Ambulatory Visit: Payer: BC Managed Care – PPO | Admitting: Physical Therapy

## 2022-08-28 DIAGNOSIS — M25571 Pain in right ankle and joints of right foot: Secondary | ICD-10-CM

## 2022-08-28 DIAGNOSIS — M5442 Lumbago with sciatica, left side: Secondary | ICD-10-CM

## 2022-08-28 DIAGNOSIS — R2681 Unsteadiness on feet: Secondary | ICD-10-CM

## 2022-08-28 DIAGNOSIS — R2689 Other abnormalities of gait and mobility: Secondary | ICD-10-CM

## 2022-08-28 DIAGNOSIS — M6281 Muscle weakness (generalized): Secondary | ICD-10-CM

## 2022-08-28 DIAGNOSIS — G8929 Other chronic pain: Secondary | ICD-10-CM

## 2022-08-29 ENCOUNTER — Ambulatory Visit: Payer: BC Managed Care – PPO | Admitting: Family Medicine

## 2022-08-29 NOTE — Progress Notes (Deleted)
    SUBJECTIVE:   CHIEF COMPLAINT / HPI:  No chief complaint on file.   Patient has been dealing with back pain radiating down to left lower extremity for the past 3 to 4 months up to be secondary to sciatica.  Pain has been quite severe, she has had facet joint injections performed last month.  She has been assessed by neurosurgery, felt to be not a surgical candidate due to her BMI.  I saw her in follow-up 2 weeks ago.  I reached out to her psychiatrist regarding switching to SNRI.  I sent in a short course of oxycodone for breakthrough pain as a one-time prescription.  She had continued to take the pregabalin at that time.  She is going through physical therapy.  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 --   PERTINENT  PMH / PSH: ***  Patient Care Team: Sabino Dick, DO as PCP - General (Family Medicine) Maisie Fus, MD as PCP - Cardiology (Cardiology)   OBJECTIVE:   There were no vitals taken for this visit.  Physical Exam      08/15/2022    1:39 PM  Depression screen PHQ 2/9  Decreased Interest 3  Down, Depressed, Hopeless 3  PHQ - 2 Score 6  Altered sleeping 3  Tired, decreased energy 3  Change in appetite 3  Feeling bad or failure about yourself  3  Trouble concentrating 3  Moving slowly or fidgety/restless 0  Suicidal thoughts 0  PHQ-9 Score 21     {Show previous vital signs (optional):23777}  {Labs  Heme  Chem  Endocrine  Serology  Results Review (optional):23779}  ASSESSMENT/PLAN:   No problem-specific Assessment & Plan notes found for this encounter.    No follow-ups on file.   Littie Deeds, MD [facility]

## 2022-09-04 ENCOUNTER — Ambulatory Visit: Payer: BC Managed Care – PPO | Admitting: Physical Therapy

## 2022-09-04 ENCOUNTER — Other Ambulatory Visit: Payer: Self-pay

## 2022-09-04 MED ORDER — CYCLOBENZAPRINE HCL 10 MG PO TABS: 10.0000 mg | ORAL_TABLET | Freq: Three times a day (TID) | ORAL | 0 refills | Status: DC | PRN

## 2022-09-05 NOTE — Progress Notes (Unsigned)
    SUBJECTIVE:   CHIEF COMPLAINT / HPI:   Sciatica: Seen 6/7. Doing PT. Taking Tylenol and Pregabalin 75mg  BID  Need pap and colonoscopy  PERTINENT  PMH / PSH: ***  OBJECTIVE:   There were no vitals taken for this visit. ***  General: NAD, pleasant, able to participate in exam Cardiac: RRR, no murmurs. Respiratory: CTAB, normal effort, No wheezes, rales or rhonchi Abdomen: Bowel sounds present, nontender, nondistended Extremities: no edema or cyanosis. Skin: warm and dry, no rashes noted Neuro: alert, no obvious focal deficits Psych: Normal affect and mood  ASSESSMENT/PLAN:   No problem-specific Assessment & Plan notes found for this encounter.     Dr. Elberta Fortis, DO [facility]    {    This will disappear when note is signed, click to select method of visit    :1}

## 2022-09-08 ENCOUNTER — Encounter: Payer: Self-pay | Admitting: Family Medicine

## 2022-09-08 ENCOUNTER — Ambulatory Visit (INDEPENDENT_AMBULATORY_CARE_PROVIDER_SITE_OTHER): Payer: BC Managed Care – PPO | Admitting: Family Medicine

## 2022-09-08 VITALS — BP 123/85 | HR 110 | Ht 71.0 in | Wt 334.4 lb

## 2022-09-08 DIAGNOSIS — Z0000 Encounter for general adult medical examination without abnormal findings: Secondary | ICD-10-CM

## 2022-09-08 DIAGNOSIS — F411 Generalized anxiety disorder: Secondary | ICD-10-CM | POA: Diagnosis not present

## 2022-09-08 DIAGNOSIS — M5432 Sciatica, left side: Secondary | ICD-10-CM

## 2022-09-08 DIAGNOSIS — M5442 Lumbago with sciatica, left side: Secondary | ICD-10-CM

## 2022-09-08 DIAGNOSIS — F332 Major depressive disorder, recurrent severe without psychotic features: Secondary | ICD-10-CM | POA: Diagnosis not present

## 2022-09-08 DIAGNOSIS — E119 Type 2 diabetes mellitus without complications: Secondary | ICD-10-CM | POA: Diagnosis not present

## 2022-09-08 DIAGNOSIS — Z794 Long term (current) use of insulin: Secondary | ICD-10-CM

## 2022-09-08 NOTE — Assessment & Plan Note (Signed)
Need to consolidate medication list and discuss PAP and colon cancer screening at next visit

## 2022-09-08 NOTE — Patient Instructions (Addendum)
It was wonderful to see you today! Thank you for choosing [facility].   Please bring ALL of your medications with you to every visit.   Today we talked about:  I am referring you to pain management and Neurosurgery to help evaluate you for pain control options. You will here from our office about these referrals. Please continue to use the Pregabalin and Tylenol for pain management and continue to go to physical therapy. Please discuss disability paperwork with the social worker tomorrow and have them send it to me to fill out.  Please follow up in 2 weeks  Call the clinic at [phone] if your symptoms worsen or you have any concerns.  Please be sure to schedule follow up at the front desk before you leave today.   Elberta Fortis, DO Family Medicine

## 2022-09-08 NOTE — Assessment & Plan Note (Addendum)
Pain significantly uncontrolled. No symptom relief with steroid injections, Oxycodone, Pregabalin, Tylenol. Working with PT but feels limited by how much pain she is in. Unable to work or perform basic ADLs. Willing to try pain clinic but feels she will likely ultimately need surgery. Patient has not formally been seen by NSGY although they were consulted in the hospital and recommended weight loss prior to surgery. -Refer to NSGY for evaluation. Anticipate patient will need to continue with weight loss but will hopefully establish goal weight for possible surgical intervention -Refer to pain clinic to discuss alternative pain modalities

## 2022-09-08 NOTE — Assessment & Plan Note (Addendum)
A1c 7.7 in 07/2022. Weight loss ~70 pounds from 1 year ago on Ozempic. -ACR obtained -A1c at next visit in 1 month -Discuss diabetic eye exam at next visit -Consider transitioning from Ozempic to [facility] if weight loss plateaus

## 2022-09-08 NOTE — Assessment & Plan Note (Addendum)
PHQ: 24, positive answer on question 9. States she has passive SI ("how can I live like this") but does not have a plan to harm herself. Depression appears to be steaming from uncontrolled pain. Discussed safety planning, patient states she will call the suicide line if she starts to develop a plan and was able to state the number. Follows with Doyne Keel NP for behavioral health, reports upon coming appointment this week. -F/u with behavioral health for management

## 2022-09-09 ENCOUNTER — Other Ambulatory Visit: Payer: BC Managed Care – PPO

## 2022-09-09 LAB — MICROALBUMIN / CREATININE URINE RATIO
Creatinine, Urine: 350.3 mg/dL
Microalb/Creat Ratio: 54 mg/g creat — ABNORMAL HIGH (ref 0–29)
Microalbumin, Urine: 187.5 ug/mL

## 2022-09-09 NOTE — Patient Instructions (Signed)
Visit Information  The Patient                                              was given information about Medicaid Managed Care team care coordination services and consented to engagement with the Medicaid Managed Care team.   Social Worker will follow up in 15 days.   Alinah Sheard, BSW, MHA [place]  Managed Medicaid Social Worker [phone]  

## 2022-09-09 NOTE — Patient Outreach (Signed)
Medicaid Managed Care Social Work Note  09/09/2022 Name:  [name] MRN:  [id number] DOB:  10/24/1970  [name] is an 52 y.o. year old female who is a primary patient of Samantha Fortis, MD.  The Medicaid Managed Care Coordination team was consulted for assistance with:  [name]   [name] was given information about Medicaid Managed Care Coordination team services today. [name] Patient agreed to services and verbal consent obtained.  Engaged with patient  for by telephone forfollow up visit in response to referral for case management and/or care coordination services.   Assessments/Interventions:  Review of past medical history, allergies, medications, health status, including review of consultants reports, laboratory and other test data, was performed as part of comprehensive evaluation and provision of chronic care management services.  SDOH: (Social Determinant of Health) assessments and interventions performed: SDOH Interventions    Flowsheet Row Office Visit from 06/23/2022 in [facility] ED to Hosp-Admission (Discharged) from 02/20/2022 in [facility] Office Visit from 10/01/2021 in [facility] Office Visit from 07/25/2020 in [facility]  SDOH Interventions      Transportation Interventions -- Contracted Vendor  [Safe transport] -- --  Depression Interventions/Treatment  Medication, Currently on Treatment -- Medication Referral to Psychiatry, Currently on Treatment      BSW completed a telephone outreach with patient. She states her PCP informed her she may not be able to go back to work next month and may want to look into applying for disability. Patient states she did receieve a letter from her job stating she would need to report back in August. BSW encouraged patient to reach out to her employer to see if they could extend her STD. BSW will send  patient information for applying for disability. Patient did receive resources BSW sent.  Advanced Directives Status:  Not addressed in this encounter.  Care Plan                 Allergies  Allergen Reactions   Ace Inhibitors Other (See Comments)    Patient had acute renal failure requiring hemodialysis after a suicide attempt.  Nephro recommended avoiding use.   Haldol [Haloperidol Lactate] Other (See Comments)    Tardive dyskinesia   Nsaids Other (See Comments)    Nephro recommended avoiding after acute renal failure requiring hemodialysis associated with a suicide attempt.   Entresto [Sacubitril-Valsartan] Itching   Latex Other (See Comments)    Rash around IV site    Medications Reviewed Today     Reviewed by Samantha Fortis, MD (Resident) on 09/08/22 at 2139  Med List Status: <None>   Medication Order Taking? Sig Documenting Provider Last Dose Status Informant  albuterol (VENTOLIN HFA) 108 (90 Base) MCG/ACT inhaler [id number] No Inhale 2 puffs into the lungs every 6 (six) hours as needed for shortness of breath. Clapacs, Jackquline Denmark, MD Taking Active Self, Pharmacy Records  blood glucose meter kit and supplies KIT [id number] No Dispense based on patient and insurance preference. Use up to four times daily as directed. Sabino Dick, DO Taking Active Self, Pharmacy Records  cyclobenzaprine (FLEXERIL) 10 MG tablet [id number]  Take 1 tablet (10 mg total) by mouth 3 (three) times daily as needed for muscle spasms. Sabino Dick, DO  Active   DULoxetine (CYMBALTA) 20 MG capsule [id number]  Take 1 capsule (20 mg total) by mouth daily. Shanna Cisco, NP  Active  fluticasone (FLONASE) 50 MCG/ACT nasal spray [id number] No Place 1 spray into both nostrils daily. 1 spray in each nostril every day Sabino Dick, DO Taking Active Self, Pharmacy Records           Med Note Cooper Render, CAMILLE N   Tue Jul 08, 2022  5:36 PM) Taking as needed  fluticasone-salmeterol [facility])  250-50 MCG/ACT AEPB [id number] No Inhale 1 puff into the lungs in the morning and at bedtime. Sabino Dick, DO Taking Active Self, Pharmacy Records           Med Note [facility], [facility]   Thu Jul 24, 2022  1:18 PM) Taking as needed.   furosemide (LASIX) 40 MG tablet [id number] No Take 1 tablet (40 mg total) by mouth daily. Clapacs, Jackquline Denmark, MD Taking Active Self, Pharmacy Records  Patient not taking:  Discontinued 09/08/22 2139 (Completed Course)            Med Note Edwena Blow, Janene Harvey   Thu Jul 24, 2022  1:18 PM) Patient states she no longer has any refills.  hydrOXYzine (ATARAX) 50 MG tablet [id number]  Take 1 tablet (50 mg total) by mouth 3 (three) times daily as needed for anxiety. Toy Cookey E, NP  Active   insulin glargine (LANTUS) 100 UNIT/ML Solostar Pen [id number]  Inject 65 Units into the skin 2 (two) times daily. Sabino Dick, DO  Active   Insulin Pen Needle 32G X 4 MM MISC [id number] No 1 each by Does not apply route 3 (three) times daily. Sabino Dick, DO Taking Active Self, Pharmacy Records  Patient taking differently:  Discontinued 05/21/21 1320 (Reorder) isosorbide-hydrALAZINE (BIDIL) 20-37.5 MG tablet [id number] No Take 1 tablet by mouth 3 (three) times daily. Clapacs, Jackquline Denmark, MD Taking Active Self, Pharmacy Records  levothyroxine (SYNTHROID) 300 MCG tablet [id number] No Take 1 tablet (300 mcg total) by mouth daily at 6 (six) AM. Sabino Dick, DO Taking Active Self, Pharmacy Records  lidocaine (LIDODERM) 5 % [id number] No Place 1 patch onto the skin daily. Remove & Discard patch within 12 hours or as directed by MD Henderly, Britni A, PA-C Taking Active   LORazepam (ATIVAN) 1 MG tablet [id number]  Take 1 tablet by mouth twice daily as needed for anxiety Ganta, Anupa, DO  Active   metoprolol tartrate (LOPRESSOR) 25 MG tablet [id number] No Take 1 tablet (25 mg total) by mouth 2 (two) times daily. Clapacs, Jackquline Denmark, MD Taking Active Self, Pharmacy Records            Med Note [facility], [facility]   Thu Jul 24, 2022  1:22 PM) Patient states that she has been off this medication for a while but is currently back taking this medication. Last admin yesterday 07/23/2022.   mirtazapine (REMERON) 15 MG tablet [id number]  Take 2 tablets (30 mg total) by mouth at bedtime. Shanna Cisco, NP  Active   NOVOLOG FLEXPEN 100 UNIT/ML FlexPen [id number]  INJECT 15 UNITS SUBCUTANEOUSLY WITH LUNCH AND INJECT 20 UNITS SUBCUTANEOUSLY WITH SUPPER Sabino Dick, DO  Active     Discontinued 09/08/22 2139 (Completed Course)   rosuvastatin (CRESTOR) 20 MG tablet [id number] No Take 1 tablet (20 mg total) by mouth daily. Clapacs, Jackquline Denmark, MD Taking Active Self, Pharmacy Records  Semaglutide, 2 MG/DOSE, (OZEMPIC, 2 MG/DOSE,) 8 MG/3ML SOPN [id number]  Inject 2 mg into the skin once per week. Sabino Dick, DO  Active             Patient Active Problem List  Diagnosis Date Noted   Acute left-sided low back pain with left-sided sciatica 07/08/2022   Onychomycosis 10/01/2021   SVT (supraventricular tachycardia) 05/28/2021   Obesity hypoventilation syndrome (HCC) 05/28/2021   CHF (congestive heart failure) (HCC)    Healthcare maintenance 04/15/2021   Arthritis of right ankle 07/25/2020   MDD (major depressive disorder) 11/21/2019   Bilateral knee pain 03/14/2019   Osteoarthritis 11/24/2018   Bipolar disorder (HCC) 04/29/2017   HLD (hyperlipidemia) 03/19/2017   Asthma 03/14/2017   MDD (major depressive disorder), recurrent severe, 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   Vitamin D deficiency 04/11/2015   Adjustment disorder with mixed anxiety and depressed mood    Dysmenorrhea 11/25/2013   Suicidal ideation 05/27/2011   Chronic kidney disease (CKD) 11/06/2010   ENDOMETRIAL HYPERPLASIA UNSPECIFIED 02/07/2008   MENORRHAGIA 01/03/2008   Allergic rhinitis 06/24/2007   Essential hypertension, benign 02/03/2007   Hypothyroidism 05/07/2006   T2DM (type 2 diabetes mellitus)  (HCC) 05/07/2006   Morbid obesity (HCC) 05/07/2006   Iron deficiency anemia 05/07/2006   Obsessive-compulsive disorder 05/07/2006   Generalized anxiety disorder 05/07/2006    Conditions to be addressed/monitored per PCP order:   community resources  There are no care plans that you recently modified to display for this patient.   Follow up:  Patient agrees to Care Plan and Follow-up.  Plan: The Managed Medicaid care management team will reach out to the patient again over the next 15 days.  Date/time of next scheduled Social Work care management/care coordination outreach:  09/30/22  Gus Puma, Kenard Gower, [facility]  Managed [facility] Social Worker [phone]

## 2022-09-10 ENCOUNTER — Ambulatory Visit: Payer: BC Managed Care – PPO | Attending: Family Medicine | Admitting: Physical Therapy

## 2022-09-10 ENCOUNTER — Encounter: Payer: Self-pay | Admitting: Physical Therapy

## 2022-09-10 DIAGNOSIS — R2681 Unsteadiness on feet: Secondary | ICD-10-CM | POA: Insufficient documentation

## 2022-09-10 DIAGNOSIS — M5442 Lumbago with sciatica, left side: Secondary | ICD-10-CM | POA: Insufficient documentation

## 2022-09-10 DIAGNOSIS — R2689 Other abnormalities of gait and mobility: Secondary | ICD-10-CM | POA: Insufficient documentation

## 2022-09-10 NOTE — Therapy (Signed)
  OUTPATIENT PHYSICAL THERAPY TREATMENT NOTE   Patient Name: [name] MRN: [id number] DOB:Nov 29, 1970, 52 y.o., female Today's Date: 09/10/2022  END OF SESSION:  PT End of Session - 09/10/22 1408     Visit Number --   arrived- no charge   Number of Visits 17    Date for PT Re-Evaluation 09/24/22    Authorization Type BCBS    PT Start Time 0207              PCP: Sabino Dick, DO  REFERRING PROVIDER: Latrelle Dodrill, MD  REFERRING DIAG: [phone] (ICD-10-CM) - Left sided sciatica   Rationale for Evaluation and Treatment: Rehabilitation  THERAPY DIAG:  Low back pain with left-sided sciatica, unspecified back pain laterality, unspecified chronicity  Other abnormalities of gait and mobility  Unsteadiness on feet  ONSET DATE: mid March 2024  SUBJECTIVE:  " I can't do anything. I can barely walk or put weight on my leg. My son and the front desk person helped me get in the wheel chair. My cane is in the car but I cannot walk. I do not have a walker. My doctor said I will not be able to go back to work. I am waiting on Pain Management and Neurosurgery to call."  Clinical impression: Pt presents to PT appointment in wheel chair and is slumped over to her left side and is crying when therapist greets in her the lobby. She saw her MD this week who referred her to neurosurgery and to pain management. She is waiting calls for these appointments to be scheduled. She reports being in severe pain but did not want to be a no-show to her appointment today. Therapist and patient decided that PT will be placed on HOLD until she consults with neurosurgery and pain management. She will continued with her HEP as able.                                                                                                                                                                              Jannette Spanner, PTA 09/10/22 2:35 PM Phone: [phone] Fax: [phone]

## 2022-09-12 ENCOUNTER — Telehealth: Payer: Self-pay | Admitting: Family Medicine

## 2022-09-12 DIAGNOSIS — Z794 Long term (current) use of insulin: Secondary | ICD-10-CM

## 2022-09-12 DIAGNOSIS — M5432 Sciatica, left side: Secondary | ICD-10-CM

## 2022-09-12 MED ORDER — LOSARTAN POTASSIUM 25 MG PO TABS
25.0000 mg | ORAL_TABLET | Freq: Every day | ORAL | 1 refills | Status: DC
Start: 2022-09-12 — End: 2023-11-02

## 2022-09-12 MED ORDER — OXYCODONE-ACETAMINOPHEN 5-325 MG PO TABS
1.0000 | ORAL_TABLET | Freq: Two times a day (BID) | ORAL | 0 refills | Status: DC | PRN
Start: 2022-09-12 — End: 2022-10-17

## 2022-09-12 NOTE — Telephone Encounter (Signed)
Spoke with patient regarding lab work. ACR elevated to 54 compared to normal value last year. Patient unable to tolerate SGLT2, not currently on ACEi/ARB. Discussed starting Losartan and patient agreeable. Scheduled for BMP follow up in 1 week to assess potassium.  Patient reports continued excruciating pain. Attempted to go to PT on 7/3 but reports they did not recommend working with her due to severe pain. States PT is now on hold (confirmed in PT documentation). Offered additional pain modalities to help until she is seen by pain management/NSGY. Patient willing to to try short course of Percocet 5-325 q12h prn #30 tablets to be used sparingly when trying to be active. Will reassess at follow up with me scheduled on 7/22.  Elberta Fortis, DO

## 2022-09-15 ENCOUNTER — Telehealth: Payer: Self-pay

## 2022-09-15 NOTE — Telephone Encounter (Signed)
Patient calls nurse line reporting continued left sciatica pain.   She reports new pain around her groin area that is traveling down her left leg.   She reports her son is on his way to pick up her Oxycodone prescription.  Patient advised to call me back if medication does not give her any relief.   Patient agreed with plan.

## 2022-09-16 ENCOUNTER — Ambulatory Visit: Payer: BC Managed Care – PPO | Admitting: Physical Therapy

## 2022-09-16 ENCOUNTER — Other Ambulatory Visit: Payer: Self-pay

## 2022-09-16 MED ORDER — LORAZEPAM 1 MG PO TABS: 1.0000 mg | ORAL_TABLET | Freq: Two times a day (BID) | ORAL | 0 refills | Status: DC | PRN

## 2022-09-16 NOTE — Telephone Encounter (Signed)
PDMP reviewed, last refilled Lorazepam 1mg  #45 on 08/13/2022. Will discuss continuing medication taper at follow up visit.

## 2022-09-18 ENCOUNTER — Ambulatory Visit: Payer: BC Managed Care – PPO | Admitting: Physical Therapy

## 2022-09-18 ENCOUNTER — Other Ambulatory Visit: Payer: Self-pay

## 2022-09-18 ENCOUNTER — Ambulatory Visit (INDEPENDENT_AMBULATORY_CARE_PROVIDER_SITE_OTHER): Payer: BC Managed Care – PPO | Admitting: Student

## 2022-09-18 VITALS — BP 152/101 | HR 104 | Ht 71.0 in | Wt 341.0 lb

## 2022-09-18 DIAGNOSIS — I10 Essential (primary) hypertension: Secondary | ICD-10-CM | POA: Diagnosis not present

## 2022-09-18 DIAGNOSIS — F332 Major depressive disorder, recurrent severe without psychotic features: Secondary | ICD-10-CM

## 2022-09-18 DIAGNOSIS — E1165 Type 2 diabetes mellitus with hyperglycemia: Secondary | ICD-10-CM | POA: Diagnosis not present

## 2022-09-18 DIAGNOSIS — M5442 Lumbago with sciatica, left side: Secondary | ICD-10-CM

## 2022-09-18 DIAGNOSIS — Z794 Long term (current) use of insulin: Secondary | ICD-10-CM

## 2022-09-18 MED ORDER — ALBUTEROL SULFATE HFA 108 (90 BASE) MCG/ACT IN AERS: 2.0000 | INHALATION_SPRAY | Freq: Four times a day (QID) | RESPIRATORY_TRACT | 5 refills | Status: DC | PRN

## 2022-09-18 MED ORDER — PREDNISONE 50 MG PO TABS
50.0000 mg | ORAL_TABLET | Freq: Every day | ORAL | 0 refills | Status: DC
Start: 2022-09-18 — End: 2022-09-29

## 2022-09-18 NOTE — Assessment & Plan Note (Signed)
Pt reports passive SI with no plan to hurt herself or end her life. Symptoms seem to be related to pain and loss of sleep; will address above. She receives outpatient and medication therapy. Continue same for now; can reevaluate at follow-up visit on 7/22.

## 2022-09-18 NOTE — Assessment & Plan Note (Addendum)
Pt has continued significant sciatica pain, along with new pain in L inner/under upper thigh and a sensation of "trickling water" down left calf. No saddle anesthesia, loss of bowel/bladder continence; no concern for cauda equina syndrome. Can consider osteoarthritis of L hip given new complaint of groin pain; will evaluate with L hip Xray. Will provide prednisone taper for symptom relief.We are running out of management options in our clinic, referral placed to pain clinic.  I do not believe that further steroid injections would be beneficial given it was not useful in the past.

## 2022-09-18 NOTE — Progress Notes (Signed)
SUBJECTIVE:   CHIEF COMPLAINT / HPI:   [name] is a 52 y.o. female who presents today for back and leg pain.  Sciatica Previously seen 7/1 for same L-sided sciatica. At that time, reported symptoms not improved with steroid back injection, Oxycodone, Tylenol or Pregabalin. Also reported she was going to lose her job in 10/2022 due to being unable to work with the pain. She was referred to Neurosurgery and pain clinic; referral to [facility] and Neurosurgery was placed 7/10 and is in process. She was also tried on short course of Percocet. She was undergoing PT, but this was placed on hold after appointment on 7/3 when pt was unable to participate due to pain.  She now reports at her 7/11 appointment that her sciatica has worsened. She is intermittently teary throughout visit due to pain. She reports pain in her L inner and under thigh which started Mon 7/8. Reports sensation of "water trickling down" her left calf which began yesterday. She reports pain relief with laying down on her left side and drawing right leg up into her; she reports worse pain when up moving about. Reports has not slept well the last few nights due to pain. She ambulates with use of a cane or wheelchair due to pain.   She reports weakness in the muscles of her left leg. Denies leaking of urine. No bowel incontinence. Denies numbness in groin. She reports percocet helped the pain a little.  She is wondering if she needs another spine shot although the last one was not beneficial for her.  Depression She reports suicidal thoughts which she attributes to poor sleep over the last few days and her pain. No active plan.  PERTINENT  PMH / PSH: T2DM, MDD, Hypothyroidism, Asthma, HTN   OBJECTIVE:  BP (!) 152/101   Pulse (!) 104   Ht 5\' 11"  (1.803 m)   Wt (!) 341 lb (154.7 kg)   LMP 09/08/2022   SpO2 100%   BMI 47.56 kg/m   General: Pt is purposefully sitting in chair with left side slumped over armrest, reports this  provides pain relief. She is intermittently teary throughout visit. In distress, nontoxic appearing. Cardiovascular: RRR, no murmurs, rubs, gallops. Pulmonary: Breathing inturrupted by occasional gasps related to crying; otherwise normal work of breathing. Lungs clear to auscultation bilaterally. MSK: Exam limited due to pain.  Sensation intact and equal on bilateral thighs; sensation in bilateral lower legs, R>L.  Strength exam limited by pain. Pt unable to sit upright for long due to pain. Neuro/Psych: Teary and crying throughout encounter  PHQ 7/11: 24 (positive Q9); unchanged from last PHQ 7/1.  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 No evidence of spondylolisthesis.  MRI Lumbar 07/06/22: 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  ASSESSMENT/PLAN:  Acute left-sided low back pain with left-sided sciatica Assessment & Plan: Pt has continued significant sciatica pain, along with new pain in L inner/under upper thigh and a sensation of "trickling water" down left calf. No saddle anesthesia, loss of bowel/bladder continence; no concern for cauda equina syndrome. Can consider osteoarthritis of L hip given new complaint of groin pain; will evaluate with L hip Xray. Will provide prednisone taper for symptom relief.We are running out of management options in our clinic, referral placed to pain clinic.  I do not believe that further  steroid injections would be beneficial given it was not useful in the past.  Orders: -     predniSONE; Take 1 tablet (50 mg total) by mouth daily with breakfast.  Dispense: 5 tablet; Refill: 0 -     DG HIP UNILAT W OR W/O PELVIS 2-3 VIEWS LEFT; Future -     Ambulatory referral to Pain Clinic  Type 2 diabetes mellitus with hyperglycemia, with long-term current  use of insulin (HCC) Assessment & Plan: She is on Ozempic, insulin. Pt to get repeat BMP today to evaluate kidney function on current medications.  Orders: -     Basic metabolic panel  MDD (major depressive disorder), recurrent severe, without psychosis (HCC) Assessment & Plan: Pt reports passive SI with no plan to hurt herself or end her life. Symptoms seem to be related to pain and loss of sleep; will address above. She receives outpatient and medication therapy. Continue same for now; can reevaluate at follow-up visit on 7/22.   Essential hypertension, benign Assessment & Plan: Blood pressure elevated this encounter, highly likely related to acute pain.  No intervention at this time.   Governor Rooks, Medical Student [facility]  I was personally present and performed or re-performed the history, physical exam and medical decision making activities of this service and have verified that the service and findings are accurately documented in the student's note.  [name] Mattocks, DO                  09/19/2022, 7:54 AM

## 2022-09-18 NOTE — Assessment & Plan Note (Signed)
She is on Ozempic, insulin. Pt to get repeat BMP today to evaluate kidney function on current medications.

## 2022-09-18 NOTE — Patient Instructions (Addendum)
It was great to see you today! Thank you for choosing [facility] for your primary care.  Today we addressed: You are referred to neurosurgery. Referral sent to: [facility] [address] [place], [place] [address] PHONE [phone] Already referred to pain medicine rehab [facility] [address] [place],  [place]  [address] Main: [phone]  I have submitted a new referral to pain medicine for you.  I have also prescribed prednisone 50 mg daily for 5 days and we will get an x-ray of that left hip see instructions below.  I have placed an order for left hip.  Please go to [facility] [employer] to have this completed.  You do not need an appointment, but if you would like to call them beforehand, their number is [phone].  We will contact you with your results afterwards.   If you haven't already, sign up for My Chart to have easy access to your labs results, and communication with your primary care physician.  Please arrive 15 minutes before your appointment to ensure smooth check in process.  We appreciate your efforts in making this happen.  Thank you for allowing me to participate in your care, [name] Mattocks, DO 09/18/2022, 1:51 PM PGY-3, [facility]

## 2022-09-19 ENCOUNTER — Other Ambulatory Visit: Payer: Self-pay

## 2022-09-19 LAB — BASIC METABOLIC PANEL
BUN/Creatinine Ratio: 15 (ref 9–23)
BUN: 26 mg/dL — ABNORMAL HIGH (ref 6–24)
CO2: 20 mmol/L (ref 20–29)
Calcium: 10.1 mg/dL (ref 8.7–10.2)
Chloride: 105 mmol/L (ref 96–106)
Creatinine, Ser: 1.79 mg/dL — ABNORMAL HIGH (ref 0.57–1.00)
Glucose: 188 mg/dL — ABNORMAL HIGH (ref 70–99)
Potassium: 5 mmol/L (ref 3.5–5.2)
Sodium: 141 mmol/L (ref 134–144)
eGFR: 33 mL/min/{1.73_m2} — ABNORMAL LOW (ref >59)

## 2022-09-19 NOTE — Assessment & Plan Note (Signed)
Blood pressure elevated this encounter, highly likely related to acute pain.  No intervention at this time.

## 2022-09-22 ENCOUNTER — Telehealth: Payer: Self-pay

## 2022-09-22 MED ORDER — FLUCONAZOLE 150 MG PO TABS: 150.0000 mg | ORAL_TABLET | Freq: Once | ORAL | 0 refills | Status: AC

## 2022-09-22 NOTE — Addendum Note (Signed)
Addended by: Veronda Prude on: 09/22/2022 12:44 PM   Modules accepted: Orders

## 2022-09-22 NOTE — Telephone Encounter (Signed)
Patient calls nurse line requesting a prescription for Diflucan.   She reports she has been on Prednisone for sciatica pain. She reports she is very "raw down there" and it hurts to wipe. She reports symptoms started to develop over the weekend.  She reports she does have an apt with Neuro Surgery tomorrow for sciatica pain.   Will forward to provider who saw patient.

## 2022-09-22 NOTE — Telephone Encounter (Signed)
Called patient back to inform that Diflucan had been sent to pharmacy.   Patient states that she is also needing refills on Symbicort and albuterol inhalers. Per chart review, albuterol inhaler was sent to "print" on 09/18/22. Symbicort is no longer on medication list. Patient states that she needs both of these because the heat causes asthma exacerbations.   Will forward to PCP.   Veronda Prude, RN

## 2022-09-23 ENCOUNTER — Ambulatory Visit
Admission: RE | Admit: 2022-09-23 | Discharge: 2022-09-23 | Disposition: A | Discharge status: Home / Self Care | Payer: BC Managed Care – PPO | Source: Ambulatory Visit | Attending: Family Medicine | Admitting: Family Medicine

## 2022-09-23 ENCOUNTER — Ambulatory Visit: Payer: BC Managed Care – PPO | Admitting: Physical Therapy

## 2022-09-23 ENCOUNTER — Other Ambulatory Visit: Payer: Self-pay | Admitting: Family Medicine

## 2022-09-23 ENCOUNTER — Telehealth: Payer: Self-pay

## 2022-09-23 DIAGNOSIS — M5442 Lumbago with sciatica, left side: Secondary | ICD-10-CM

## 2022-09-23 DIAGNOSIS — J45909 Unspecified asthma, uncomplicated: Secondary | ICD-10-CM

## 2022-09-23 MED ORDER — ALBUTEROL SULFATE HFA 108 (90 BASE) MCG/ACT IN AERS
2.0000 | INHALATION_SPRAY | Freq: Four times a day (QID) | RESPIRATORY_TRACT | 5 refills | Status: DC | PRN
Start: 2022-09-23 — End: 2023-11-04

## 2022-09-23 MED ORDER — FLUTICASONE-SALMETEROL 250-50 MCG/ACT IN AEPB
1.0000 | INHALATION_SPRAY | Freq: Two times a day (BID) | RESPIRATORY_TRACT | 1 refills | Status: AC
Start: 2022-09-23 — End: ?

## 2022-09-23 NOTE — Telephone Encounter (Signed)
 Patient has been informed. Penni Bombard CMA

## 2022-09-25 ENCOUNTER — Encounter: Payer: Self-pay | Admitting: Physical Medicine & Rehabilitation

## 2022-09-25 ENCOUNTER — Encounter
Payer: BC Managed Care – PPO | Attending: Physical Medicine & Rehabilitation | Admitting: Physical Medicine & Rehabilitation

## 2022-09-25 VITALS — BP 103/71 | HR 109 | Ht 71.0 in | Wt 341.0 lb

## 2022-09-25 DIAGNOSIS — M5416 Radiculopathy, lumbar region: Secondary | ICD-10-CM | POA: Diagnosis present

## 2022-09-25 DIAGNOSIS — M47816 Spondylosis without myelopathy or radiculopathy, lumbar region: Secondary | ICD-10-CM | POA: Diagnosis not present

## 2022-09-25 MED ORDER — METHOCARBAMOL 500 MG PO TABS: 500.0000 mg | ORAL_TABLET | Freq: Every day | ORAL | 1 refills | Status: DC

## 2022-09-25 NOTE — Patient Instructions (Signed)
Take muscle relaxer at night   Core strength is the key long term to degenerative spine issues  MRI is mainly showing arthritis that should in the long term respond to further weight loss  Aquatic therapy should help with mobilization without causing increased pain   Follow up with [facility] for lumbar spine injection

## 2022-09-25 NOTE — Progress Notes (Signed)
Subjective:    Patient ID: [name], female    DOB: 1970-08-29, 52 y.o.   MRN: [id number]  HPI CC:  Low back and Left lower extremity pain  Insidious onset 05/26/22 with pain in "butt cheek" initially on the right side but then moved to the left side about the first of April .    Patient has had steroid burst and taper without much improvement.  In addition she has tried gabapentin which she states was not helpful.  She has not tried pregabalin.  She is currently on duloxetine prescribed by her psychiatrist with the hope that it may have some benefit for her sciatic symptoms. The patient has had an injection at [facility] radiology but do not see procedure report  Pt went to PT for ~74months , last time was a couple weeks ago.  Has been doing stretches and home exercise program  Co-morbid conditions-  Bipolar disorder, hx of NSAID overdose requiring Hemodialysis , hx of morbid   Intact bowel ,  Pain Inventory AvCLINICAL DATA:  Myelopathy, acute, lumbar spine   EXAM: MRI LUMBAR SPINE WITHOUT CONTRAST   TECHNIQUE: Multiplanar, multisequence MR imaging of the lumbar spine was performed. No intravenous contrast was administered.   COMPARISON:  Lumbar radiographs December 19, 2011.   FINDINGS: Segmentation:  Standard.   Alignment:  No substantial sagittal subluxation.   Vertebrae: No specific evidence of fracture, evidence of discitis, or bone lesion. Perifacet edema at L4-L5, likely related to degenerative change/stress.   Conus medullaris and cauda equina: Conus extends to the L1-L2 level. Conus appears normal.   Paraspinal and other soft tissues: Unremarkable.   Disc levels:   T12-L1: No significant disc protrusion, foraminal stenosis, or canal stenosis.   L1-L2: Slight disc bulge.  No significant stenosis.   L2-L3: Mild disc bulge.  No significant stenosis.   L3-L4: Mild disc bulging. Mild facet arthropathy. No significant stenosis.   L4-L5: Disc bulging.  Bilateral facet arthropathy with facet joint effusions and perifacet inflammatory change. Mild bilateral foraminal stenosis. No significant canal stenosis.   L5-S1: Mild disc bulging.  No significant stenosis.   IMPRESSION: 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     Electronically Signed   By: Feliberto Harts M.D.   On: 07/06/2022 16:00 erage Pain 10 Pain Right Now 10 My pain is constant, sharp, burning, stabbing, tingling, and aching  In the last 24 hours, has pain interfered with the following? General activity 10 Relation with others 10 Enjoyment of life 10 What TIME of day is your pain at its worst? morning  and night Sleep (in general) Poor  Pain is worse with: walking, sitting, standing, and some activites Pain improves with: heat/ice, medication, and TENS Relief from Meds: 2  walk with assistance use a cane how many minutes can you walk? 2 ability to climb steps?  yes do you drive?  no  disabled: date disabled 07/03/22 I need assistance with the following:  dressing, bathing, meal prep, household duties, and shopping  weakness numbness tingling trouble walking spasms dizziness depression anxiety  New pt  New pt    Family History  Problem Relation Age of Onset   Asthma Father    Social History   Socioeconomic History   Marital status: Married    Spouse name: Not on file   Number of children: 2   Years of education: Not on file  Highest education level: Not on file  Occupational History   Occupation: Research officer, trade union  Tobacco Use   Smoking status: Never    Passive exposure: Never   Smokeless tobacco: Never  Vaping Use   Vaping status: Never Used  Substance and Sexual Activity   Alcohol use: No   Drug use: No   Sexual activity: Yes    Comment: with husband   Other Topics Concern   Not on  file  Social History Narrative   Married high school boyfriend at age 89, two boys, still married but he has been living with other women for years.   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         Social Determinants of Corporate investment banker Strain: Not on file  Food Insecurity: No Food Insecurity (07/08/2022)   Hunger Vital Sign    Worried About Programme researcher, broadcasting/film/video in the Last Year: Never true    Ran Out of Food in the Last Year: Never true  Recent Concern: Food Insecurity - Food Insecurity Present (07/08/2022)   Hunger Vital Sign    Worried About Programme researcher, broadcasting/film/video in the Last Year: Sometimes true    Ran Out of Food in the Last Year: Sometimes true  Transportation Needs: No Transportation Needs (07/08/2022)   PRAPARE - Administrator, Civil Service (Medical): No    Lack of Transportation (Non-Medical): No  Physical Activity: Not on file  Stress: Not on file  Social Connections: Not on file   Past Surgical History:  Procedure Laterality Date   ANKLE ARTHROSCOPY Right 08/17/2015   Procedure: RIGHT ANKLE ARTHROSCOPY WITH SYNOVECTOMY AND LOOSE BODY EXCISION;  Surgeon: Marcene Corning, MD;  Location: [facility] OR;  Service: Orthopedics;  Laterality: Right;   CHOLECYSTECTOMY     TUBAL LIGATION  1998   Past Medical History:  Diagnosis Date   Acute renal failure (HCC) 05/27/10   hemodialysis for 6 weeks   Anxiety    Asthma    Depression    Diabetes mellitus    Hypertension    Rhabdomyolysis 06/06/10   after drug overdose   Suicide attempt by drug ingestion (HCC) 06/05/10   result rhabdomyolosis and ARF requrining dialysis    BP 103/71   Pulse (!) 109   Ht 5\' 11"  (1.803 m)   Wt (!) 341 lb (154.7 kg)   LMP 09/08/2022   SpO2 95%   BMI 47.56 kg/m   Opioid Risk Score:   Fall Risk Score:  `1  Depression screen PHQ 2/9     09/25/2022   12:28 PM 09/18/2022    1:50 PM 09/08/2022    2:15 PM 08/15/2022    1:39 PM 05/21/2022     4:00 PM 04/22/2022    3:44 PM 03/26/2022    9:05 AM  Depression screen PHQ 2/9  Decreased Interest 3 3 3 3  1    Down, Depressed, Hopeless 3 3 3 3  1    PHQ - 2 Score 6 6 6 6  2    Altered sleeping 3 3 3 3  1    Tired, decreased energy 3 3 3 3  1    Change in appetite 3 3 3 3  1    Feeling bad or failure about yourself  3 3 3 3   0   Trouble concentrating 3 3 3 3  1    Moving slowly or fidgety/restless 3 0 0 0  0  Suicidal thoughts 2 3 3  0  0   PHQ-9 Score 26 24 24 21  6    Difficult doing work/chores Extremely dIfficult Extremely dIfficult          Information is confidential and restricted. Go to Review Flowsheets to unlock data.     Review of Systems  Musculoskeletal:  Positive for back pain, gait problem and joint swelling.       Pain in low back going down leg to tips of toes on left side spasms  Neurological:  Positive for weakness and numbness.  Psychiatric/Behavioral:  Positive for dysphoric mood. The patient is nervous/anxious.   All other systems reviewed and are negative.      Objective:   Physical Exam Vitals reviewed.  Constitutional:      Appearance: She is obese.     Comments: The patient sits leaning to the left side she states she does this to avoid pressure on her tailbone  HENT:     Head: Normocephalic and atraumatic.  Eyes:     Extraocular Movements: Extraocular movements intact.     Conjunctiva/sclera: Conjunctivae normal.     Pupils: Pupils are equal, round, and reactive to light.  Musculoskeletal:     Comments: No pain with knee range of motion no effusion.  No ankle pain with range of motion or effusion.  No foot deformities other than mild pes planus Ambulates short distances in the exam room no evidence of toe drag or knee instability  Neurological:     Mental Status: She is alert and oriented to person, place, and time.     Cranial Nerves: No dysarthria or facial asymmetry.     Sensory: Sensation is intact.     Motor: Motor function is intact.      Coordination: Coordination is intact.     Comments: Motor strength is 5/5 bilateral hip flexor knee extensor ankle dorsiflexor Negative straight leg raise Normal sensation to light touch bilateral lower limbs Deep tendon reflexes difficult to elicit due to poor relaxation. Sitting balance is good although she feels more comfortable leaning to the left side  Psychiatric:        Mood and Affect: Mood is anxious. Affect is labile.        Speech: Speech normal.        Behavior: Behavior normal. Behavior is cooperative.        Thought Content: Thought content does not include suicidal ideation. Thought content does not include suicidal plan.        Cognition and Memory: Cognition normal.           Assessment & Plan:    1.  Lumbar spondylosis without myelopathy.  Her lumbar MRI scan does not show clear-cut compressive lesions.  She has no physical exam findings consistent with radiculopathy.  We discussed the arthritic changes in the facet joints on her MRI.  We also discussed that there are no significant compressive lesions that are worrisome at this time..  We discussed the importance of increasing her activity level.  We discussed the importance of moving even if she does have pain to prevent further deconditioning.  We discussed long-term issues including weight loss and core strengthening. In terms of medication management discussed that she should not be taking ibuprofen given her history of NSAID overdose with renal failure.  She may continue Tylenol as per labeling.  In addition I think it would be beneficial for her to follow-up with psychiatry to help with coping as well as  anxiety management which is contributing to her pain perception.  The patient has already tried gabapentin she is currently on duloxetine for neuropathic pain.  Other option may be pregabalin.  Psychiatry may evaluate how this may fit into her overall pharmacopeia.  The patient saw neurosurgery earlier this  morning.  I do not see the note yet, she reports that they will set her up for a lumbar injection.  Neurosurgery and pain management through [facility] will follow this patient.  I can see her on a as needed basis. I did order aquatic therapy at [facility] Also ordered methocarbamol 500 mg nightly

## 2022-09-28 NOTE — Progress Notes (Unsigned)
    SUBJECTIVE:   CHIEF COMPLAINT / HPI:   Seen by NSGY - recommended epidural injection in L4-L5 region. Saw PM&R on 7/18 and started on Methocarbamol nightly.  PERTINENT  PMH / PSH: ***  OBJECTIVE:   LMP 09/08/2022  ***  General: NAD, pleasant, able to participate in exam Cardiac: RRR, no murmurs. Respiratory: CTAB, normal effort, No wheezes, rales or rhonchi Abdomen: Bowel sounds present, nontender, nondistended Extremities: no edema or cyanosis. Skin: warm and dry, no rashes noted Neuro: alert, no obvious focal deficits Psych: Normal affect and mood  ASSESSMENT/PLAN:   No problem-specific Assessment & Plan notes found for this encounter.     Dr. Elberta Fortis, DO [facility]    {    This will disappear when note is signed, click to select method of visit    :1}

## 2022-09-29 ENCOUNTER — Telehealth: Payer: Self-pay

## 2022-09-29 ENCOUNTER — Ambulatory Visit (INDEPENDENT_AMBULATORY_CARE_PROVIDER_SITE_OTHER): Payer: BC Managed Care – PPO | Admitting: Family Medicine

## 2022-09-29 ENCOUNTER — Encounter: Payer: Self-pay | Admitting: Family Medicine

## 2022-09-29 VITALS — BP 107/74 | HR 111 | Ht 71.0 in | Wt 332.4 lb

## 2022-09-29 DIAGNOSIS — M5430 Sciatica, unspecified side: Secondary | ICD-10-CM

## 2022-09-29 DIAGNOSIS — Z1231 Encounter for screening mammogram for malignant neoplasm of breast: Secondary | ICD-10-CM | POA: Diagnosis not present

## 2022-09-29 DIAGNOSIS — Z1211 Encounter for screening for malignant neoplasm of colon: Secondary | ICD-10-CM | POA: Diagnosis not present

## 2022-09-29 DIAGNOSIS — G8929 Other chronic pain: Secondary | ICD-10-CM

## 2022-09-29 DIAGNOSIS — M5442 Lumbago with sciatica, left side: Secondary | ICD-10-CM

## 2022-09-29 DIAGNOSIS — F332 Major depressive disorder, recurrent severe without psychotic features: Secondary | ICD-10-CM

## 2022-09-29 DIAGNOSIS — E1122 Type 2 diabetes mellitus with diabetic chronic kidney disease: Secondary | ICD-10-CM

## 2022-09-29 DIAGNOSIS — Z794 Long term (current) use of insulin: Secondary | ICD-10-CM

## 2022-09-29 DIAGNOSIS — Z0000 Encounter for general adult medical examination without abnormal findings: Secondary | ICD-10-CM

## 2022-09-29 MED ORDER — METHOCARBAMOL 1000 MG PO TABS
1000.0000 mg | ORAL_TABLET | Freq: Two times a day (BID) | ORAL | 1 refills | Status: DC
Start: 2022-09-29 — End: 2022-09-29

## 2022-09-29 MED ORDER — METHOCARBAMOL 500 MG PO TABS
1000.0000 mg | ORAL_TABLET | Freq: Two times a day (BID) | ORAL | 1 refills | Status: DC
Start: 2022-09-29 — End: 2022-12-29

## 2022-09-29 NOTE — Telephone Encounter (Signed)
[facility] calls nurse line in regards to Methocarbamol.   She reports the medication only comes in 500mg  and not 1,000mg .   She asks the prescription be resent as 500mg  tablets.  Will forward to PCP.

## 2022-09-29 NOTE — Assessment & Plan Note (Signed)
-  Due for colon cancer screening, ordered Cologuard given patient is not a good candidate for colonoscopy -Mammogram ordered and provided information for the breast cancer -Due for Pap, unable to tolerate exam currently

## 2022-09-29 NOTE — Assessment & Plan Note (Signed)
PHQ: 21, positive for SI without active plan. SI appears to be tied to hopelessness associated with chronic pain. H/o suicide attempt. Discussed safety planning, patient states she would call [phone]. Follows with Psychiatry closely, encouraged patient to follow up with them to continue care.

## 2022-09-29 NOTE — Patient Instructions (Addendum)
It was wonderful to see you today! Thank you for choosing [facility].   Please bring ALL of your medications with you to every visit.   Today we talked about:  Please try to take the Methocarbamol during the day and at night for pain. Also follow up with the Neurosurgeon this week for injections in your back. I will have our office follow up with the disability company to try and get the paperwork that I can complete. I ordered the Cologuard testing for colon cancer screening and mammogram. Next time we will get your A1c Mammogram information: [address],  [place]  [address] [phone]  Please follow up in 2 weeks for pain check  Call the clinic at [phone] if your symptoms worsen or you have any concerns.  Please be sure to schedule follow up at the front desk before you leave today.   Elberta Fortis, DO Family Medicine

## 2022-09-29 NOTE — Assessment & Plan Note (Addendum)
Pain continues to be significantly uncontrolled. Limiting ambulation and ADLs. NSGY planning from epidural injections in 3 days, optimistic this will provide patient with some relief. She does feel like the Methocarbamol helped, will increase dosing. -Increase to Methocarbamol 1000mg  BID -Completed work note and faxed to employer to extend time off -Will attempt to call disability company as patient states they have faxed paperwork multiple times but our office has not received any forms

## 2022-09-29 NOTE — Assessment & Plan Note (Addendum)
Compliant on Ozempic 2mg  weekly, Lantus 65U BID and Novolog 15U & 20U. Weight loss of ~40 pounds in the last 6 months. Due for diabetic eye exam -Needs A1c at follow up

## 2022-09-30 ENCOUNTER — Other Ambulatory Visit: Payer: BC Managed Care – PPO

## 2022-09-30 NOTE — Patient Outreach (Signed)
Medicaid Managed Care Social Work Note  09/30/2022 Name:  [name] MRN:  [id number] DOB:  June 29, 1970  [name] is an 52 y.o. year old female who is a primary patient of [name].  The Medicaid Managed Care Coordination team was consulted for assistance with:  [facility]   [name] was given information about Medicaid Managed Care Coordination team services today. [name] Patient agreed to services and verbal consent obtained.  Engaged with patient  for by telephone forfollow up visit in response to referral for case management and/or care coordination services.   Assessments/Interventions:  Review of past medical history, allergies, medications, health status, including review of consultants reports, laboratory and other test data, was performed as part of comprehensive evaluation and provision of chronic care management services.  SDOH: (Social Determinant of Health) assessments and interventions performed: SDOH Interventions    Flowsheet Row Office Visit from 09/29/2022 in [facility] Office Visit from 06/23/2022 in [facility] ED to Hosp-Admission (Discharged) from 02/20/2022 in [facility] Office Visit from 10/01/2021 in [facility] Office Visit from 07/25/2020 in [facility]  SDOH Interventions       Transportation Interventions -- -- Contracted Vendor  [Safe transport] -- --  Depression Interventions/Treatment  Currently on Treatment Medication, Currently on Treatment -- Medication Referral to Psychiatry, Currently on Treatment     BSW completed a telephone outreach with patient she stated her doctor took her out of work until November. Patient states she still has not started getting her disability, they are still asking for more information. She has applied for foodstamps but has not received her card.   Advanced  Directives Status:  Not addressed in this encounter.  Care Plan                 Allergies  Allergen Reactions   Ace Inhibitors Other (See Comments)    Patient had acute renal failure requiring hemodialysis after a suicide attempt.  Nephro recommended avoiding use.   Haldol [Haloperidol Lactate] Other (See Comments)    Tardive dyskinesia   Nsaids Other (See Comments)    Nephro recommended avoiding after acute renal failure requiring hemodialysis associated with a suicide attempt.   Entresto [Sacubitril-Valsartan] Itching   Latex Other (See Comments)    Rash around IV site    Medications Reviewed Today     Reviewed by [name] (Resident) on 09/29/22 at 2018  Med List Status: <None>   Medication Order Taking? Sig Documenting Provider Last Dose Status Informant  albuterol (VENTOLIN HFA) 108 (90 Base) MCG/ACT inhaler [id number] No Inhale 2 puffs into the lungs every 6 (six) hours as needed for shortness of breath. [name] Taking Active   blood glucose meter kit and supplies KIT [id number] No Dispense based on patient and insurance preference. Use up to four times daily as directed. [name] Taking Active Self, Pharmacy Records  Discontinued 09/29/22 1529 (Completed Course)   DULoxetine (CYMBALTA) 20 MG capsule [id number] No Take 1 capsule (20 mg total) by mouth daily. [name] Taking Active   fluticasone (FLONASE) 50 MCG/ACT nasal spray [id number] No Place 1 spray into both nostrils daily. 1 spray in each nostril every day [name] Taking Active Self, Pharmacy Records           Med Note Cooper [name], Samantha N   Tue Jul 08, 2022  5:36 PM) Taking as needed  fluticasone-salmeterol (WIXELA INHUB) 250-50 MCG/ACT AEPB [id number] No Inhale 1 puff into the lungs in the morning and at bedtime. [name] Taking Active   furosemide (LASIX) 40 MG tablet [id number] No Take 1 tablet (40 mg total) by mouth daily. Clapacs, Samantha Denmark, [name]  Taking Active Self, Pharmacy Records  hydrOXYzine (ATARAX) 50 MG tablet [id number] No Take 1 tablet (50 mg total) by mouth 3 (three) times daily as needed for anxiety. [name] Taking Active   insulin glargine (LANTUS) 100 UNIT/ML Solostar Pen [id number] No Inject 65 Units into the skin 2 (two) times daily. [name] Taking Active   Insulin Pen Needle 32G X 4 MM MISC [id number] No 1 each by Does not apply route 3 (three) times daily. [name] Taking Active Self, Pharmacy Records  Patient taking differently:  Discontinued 05/21/21 1320 (Reorder) isosorbide-hydrALAZINE (BIDIL) 20-37.5 MG tablet [id number] No Take 1 tablet by mouth 3 (three) times daily. Clapacs, Samantha Denmark, [name] Taking Active Self, Pharmacy Records  levothyroxine (SYNTHROID) 300 MCG tablet [id number] No Take 1 tablet (300 mcg total) by mouth daily at 6 (six) AM. [name] Taking Active Self, Pharmacy Records  Discontinued 09/29/22 1530 (Completed Course)   LORazepam (ATIVAN) 1 MG tablet [id number] No Take 1 tablet (1 mg total) by mouth 2 (two) times daily as needed. for anxiety [name] Taking Active   losartan (COZAAR) 25 MG tablet [id number] No Take 1 tablet (25 mg total) by mouth at bedtime. [name] Taking Active     Discontinued 09/29/22 1531   methocarbamol (ROBAXIN) 500 MG tablet [id number] Yes Take 2 tablets (1,000 mg total) by mouth 2 (two) times daily. [name]  Active    Discontinued 09/29/22 1617   metoprolol tartrate (LOPRESSOR) 25 MG tablet [id number] No Take 1 tablet (25 mg total) by mouth 2 (two) times daily. Clapacs, Samantha Denmark, [name] Taking Active Self, Pharmacy Records           Med Note [facility] [place]   Thu Jul 24, 2022  1:22 PM) Patient states that she has been off this medication for a while but is currently back taking this medication. Last admin yesterday 07/23/2022.   mirtazapine (REMERON) 15 MG tablet [id number] No Take 2 tablets (30 mg  total) by mouth at bedtime. [name] Taking Active   NOVOLOG FLEXPEN 100 UNIT/ML FlexPen [id number] No INJECT 15 UNITS SUBCUTANEOUSLY WITH LUNCH AND INJECT 20 UNITS SUBCUTANEOUSLY WITH SUPPER [name] Taking Active   oxyCODONE-acetaminophen (PERCOCET) 5-325 MG tablet [id number] No Take 1 tablet by mouth every 12 (twelve) hours as needed for severe pain. [name] Taking Active   Discontinued 09/29/22 1532 (Completed Course)   rosuvastatin (CRESTOR) 20 MG tablet [id number] No Take 1 tablet (20 mg total) by mouth daily. Clapacs, Samantha Denmark, [name] Taking Active Self, Pharmacy Records  Semaglutide, 2 MG/DOSE, (OZEMPIC, 2 MG/DOSE,) 8 MG/3ML SOPN [id number] No Inject 2 mg into the skin once per week. [name] Taking Active             Patient Active Problem List   Diagnosis Date Noted   Chronic low back pain with left-sided sciatica 07/08/2022   Onychomycosis 10/01/2021   SVT (supraventricular tachycardia) 05/28/2021   Obesity hypoventilation syndrome (HCC) 05/28/2021   CHF (congestive heart failure) (HCC)    Healthcare maintenance 04/15/2021   Arthritis of right ankle 07/25/2020   Bilateral knee pain  03/14/2019   Osteoarthritis 11/24/2018   Bipolar disorder (HCC) 04/29/2017   HLD (hyperlipidemia) 03/19/2017   Asthma 03/14/2017   MDD (major depressive disorder), recurrent severe, 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   Vitamin D deficiency 04/11/2015   Dysmenorrhea 11/25/2013   Suicidal ideation 05/27/2011   Chronic kidney disease (CKD) 11/06/2010   ENDOMETRIAL HYPERPLASIA UNSPECIFIED 02/07/2008   MENORRHAGIA 01/03/2008   Allergic rhinitis 06/24/2007   Essential hypertension, benign 02/03/2007   Hypothyroidism 05/07/2006   T2DM (type 2 diabetes mellitus) (HCC) 05/07/2006   Morbid obesity (HCC) 05/07/2006   Iron deficiency anemia 05/07/2006   Obsessive-compulsive disorder 05/07/2006   Generalized anxiety disorder 05/07/2006    Conditions  to be addressed/monitored per PCP order:   community resources  There are no care plans that you recently modified to display for this patient.   Follow up:  Patient agrees to Care Plan and Follow-up.  Plan: The Managed Medicaid care management team will reach out to the patient again over the next 15 days.  Date/time of next scheduled Social Work care management/care coordination outreach:  10/21/22  Gus Puma, Kenard Gower, [facility]  Managed [facility] Social Worker [phone]

## 2022-09-30 NOTE — Patient Instructions (Signed)
Visit Information  The Patient                                              was given information about Medicaid Managed Care team care coordination services and consented to engagement with the [facility] Managed Care team.   Social Worker will follow up on 10/21/22.   Abelino Derrick, MHA [facility]  Managed [facility] Social Worker [phone]

## 2022-10-04 NOTE — Progress Notes (Unsigned)
    SUBJECTIVE:   CHIEF COMPLAINT / HPI:   Sciatica Epidural injection with NSGY on 7/25. Increased to Methocarbamol 1000mg  BID at last visit.  Diabetes Current Regimen: Ozempic 2mg  weekly, Lantus 65U BID and Novolog 15U & 20U  CBGs: ***  Last A1c:  Lab Results  Component Value Date   HGBA1C 7.7 (H) 07/09/2022    Denies polyuria, polydipsia, hypoglycemia *** Last Eye Exam: *** Statin: *** ACE/ARB: ***   PERTINENT  PMH / PSH: ***  OBJECTIVE:   LMP 09/08/2022  ***  General: NAD, pleasant, able to participate in exam Cardiac: RRR, no murmurs. Respiratory: CTAB, normal effort, No wheezes, rales or rhonchi Abdomen: Bowel sounds present, nontender, nondistended Extremities: no edema or cyanosis. Skin: warm and dry, no rashes noted Neuro: alert, no obvious focal deficits Psych: Normal affect and mood  ASSESSMENT/PLAN:   No problem-specific Assessment & Plan notes found for this encounter.     Dr. Elberta Fortis, DO [facility]    {    This will disappear when note is signed, click to select method of visit    :1}

## 2022-10-06 ENCOUNTER — Ambulatory Visit: Payer: BC Managed Care – PPO | Admitting: Family Medicine

## 2022-10-06 ENCOUNTER — Encounter: Payer: Self-pay | Admitting: Family Medicine

## 2022-10-06 VITALS — BP 126/87 | HR 98 | Temp 98.8°F | Resp 16

## 2022-10-06 DIAGNOSIS — E1165 Type 2 diabetes mellitus with hyperglycemia: Secondary | ICD-10-CM | POA: Diagnosis not present

## 2022-10-06 DIAGNOSIS — Z794 Long term (current) use of insulin: Secondary | ICD-10-CM

## 2022-10-06 DIAGNOSIS — M5442 Lumbago with sciatica, left side: Secondary | ICD-10-CM | POA: Diagnosis not present

## 2022-10-06 DIAGNOSIS — G8929 Other chronic pain: Secondary | ICD-10-CM

## 2022-10-06 DIAGNOSIS — F332 Major depressive disorder, recurrent severe without psychotic features: Secondary | ICD-10-CM

## 2022-10-06 NOTE — Assessment & Plan Note (Signed)
Significantly improved mood, patient smiling throughout visit. Did not express any suicidal ideations. Will continue to monitor mood closely.

## 2022-10-06 NOTE — Assessment & Plan Note (Addendum)
Significant improvement with epidural injection (60% per patient report). Improved mobility and mood upon exam. Continues to have weakness but given patient pain is stable, will get her back over to PT as soon as available to work on strength. Currently supposed to return to work on 10/27/2022 but I feel she is not quite ready. I am encouraged by her progress and feel she can continue improving to return to work in the fall. -Messaged PT, requested we place new referral. Referral placed and advised patient to call and try to schedule therapy this week -Contacted [employer] who confirmed they just need a note to extend patient time off work. Faxed letter to extend time off until 11/2022 and sent copy to patient

## 2022-10-06 NOTE — Assessment & Plan Note (Signed)
A1c planned for today but patient deferred until next visit. -A1c at follow up

## 2022-10-06 NOTE — Patient Instructions (Signed)
It was wonderful to see you today! Thank you for choosing [facility].   Please bring ALL of your medications with you to every visit.   Today we talked about:  Please try to get back into Physical Therapy this week if you can. I will message them as well but please call and schedule with their office. I will speak with Leeroy Bock at [facility] about your time off and plan to send you back to work in September if things continue to go well.  Please follow up in 2 weeks  Call the clinic at [phone] if your symptoms worsen or you have any concerns.  Please be sure to schedule follow up at the front desk before you leave today.   Elberta Fortis, DO Family Medicine

## 2022-10-10 ENCOUNTER — Ambulatory Visit: Payer: BC Managed Care – PPO

## 2022-10-13 ENCOUNTER — Telehealth: Payer: Self-pay

## 2022-10-13 NOTE — Telephone Encounter (Signed)
Patient calls nurse line to inform provider that he sciatic pain has worsened since last visit. She states that her son is going to be dropping of paperwork for work for provider completion.   Patient has follow up appointment with PCP on Friday, 10/17/22 at 11 am.   Veronda Prude, RN

## 2022-10-14 ENCOUNTER — Telehealth: Payer: Self-pay | Admitting: Family Medicine

## 2022-10-14 NOTE — Telephone Encounter (Signed)
Patients son Ruthetta Zeagler) dropped off form at front desk for Disability.  Verified that patient section of form has been completed.  Last DOS/WCC with PCP was 10/06/2022.  Placed form in Red team folder to be completed by clinical staff.  Blaine A Warrick

## 2022-10-15 NOTE — Telephone Encounter (Signed)
Form has been placed in your box to be completed, please finish when you have time. Thank you! Dayshia Ottley CMA  

## 2022-10-17 ENCOUNTER — Encounter: Payer: Self-pay | Admitting: Family Medicine

## 2022-10-17 ENCOUNTER — Ambulatory Visit: Payer: BC Managed Care – PPO | Admitting: Family Medicine

## 2022-10-17 ENCOUNTER — Other Ambulatory Visit: Payer: Self-pay

## 2022-10-17 VITALS — BP 105/73 | HR 105 | Ht 71.0 in | Wt 327.6 lb

## 2022-10-17 DIAGNOSIS — M5432 Sciatica, left side: Secondary | ICD-10-CM | POA: Diagnosis not present

## 2022-10-17 DIAGNOSIS — Z794 Long term (current) use of insulin: Secondary | ICD-10-CM | POA: Diagnosis not present

## 2022-10-17 DIAGNOSIS — F411 Generalized anxiety disorder: Secondary | ICD-10-CM

## 2022-10-17 DIAGNOSIS — E1122 Type 2 diabetes mellitus with diabetic chronic kidney disease: Secondary | ICD-10-CM | POA: Diagnosis not present

## 2022-10-17 DIAGNOSIS — G8929 Other chronic pain: Secondary | ICD-10-CM

## 2022-10-17 DIAGNOSIS — I5042 Chronic combined systolic (congestive) and diastolic (congestive) heart failure: Secondary | ICD-10-CM

## 2022-10-17 DIAGNOSIS — E118 Type 2 diabetes mellitus with unspecified complications: Secondary | ICD-10-CM | POA: Diagnosis not present

## 2022-10-17 DIAGNOSIS — F332 Major depressive disorder, recurrent severe without psychotic features: Secondary | ICD-10-CM

## 2022-10-17 DIAGNOSIS — I10 Essential (primary) hypertension: Secondary | ICD-10-CM

## 2022-10-17 LAB — POCT GLYCOSYLATED HEMOGLOBIN (HGB A1C): HbA1c, POC (controlled diabetic range): 8.5 % — AB (ref 0.0–7.0)

## 2022-10-17 MED ORDER — NOVOLOG FLEXPEN 100 UNIT/ML ~~LOC~~ SOPN
PEN_INJECTOR | SUBCUTANEOUS | 2 refills | Status: DC
Start: 2022-10-17 — End: 2023-02-20

## 2022-10-17 MED ORDER — INSULIN GLARGINE 100 UNIT/ML SOLOSTAR PEN
65.0000 [IU] | PEN_INJECTOR | Freq: Two times a day (BID) | SUBCUTANEOUS | 2 refills | Status: DC
Start: 2022-10-17 — End: 2023-02-27

## 2022-10-17 MED ORDER — OXYCODONE-ACETAMINOPHEN 5-325 MG PO TABS: 1.0000 | ORAL_TABLET | Freq: Two times a day (BID) | ORAL | 0 refills | Status: DC | PRN

## 2022-10-17 MED ORDER — METFORMIN HCL ER 500 MG PO TB24
1000.0000 mg | ORAL_TABLET | Freq: Two times a day (BID) | ORAL | 1 refills | Status: DC
Start: 2022-10-17 — End: 2023-03-12

## 2022-10-17 MED ORDER — FUROSEMIDE 40 MG PO TABS
40.0000 mg | ORAL_TABLET | Freq: Every day | ORAL | 0 refills | Status: DC
Start: 2022-10-17 — End: 2023-01-07

## 2022-10-17 MED ORDER — METOPROLOL TARTRATE 25 MG PO TABS
25.0000 mg | ORAL_TABLET | Freq: Two times a day (BID) | ORAL | 0 refills | Status: AC
Start: 2022-10-17 — End: ?

## 2022-10-17 MED ORDER — LORAZEPAM 1 MG PO TABS
1.0000 mg | ORAL_TABLET | Freq: Two times a day (BID) | ORAL | 0 refills | Status: DC | PRN
Start: 2022-10-17 — End: 2022-11-17

## 2022-10-17 MED ORDER — ISOSORB DINITRATE-HYDRALAZINE 20-37.5 MG PO TABS
1.0000 | ORAL_TABLET | Freq: Three times a day (TID) | ORAL | 0 refills | Status: DC
Start: 2022-10-17 — End: 2023-03-12

## 2022-10-17 MED ORDER — DULOXETINE HCL 20 MG PO CPEP: 40.0000 mg | ORAL_CAPSULE | Freq: Every day | ORAL | 1 refills | Status: DC

## 2022-10-17 MED ORDER — OZEMPIC (2 MG/DOSE) 8 MG/3ML ~~LOC~~ SOPN
PEN_INJECTOR | SUBCUTANEOUS | 2 refills | Status: DC
Start: 2022-10-17 — End: 2023-01-30

## 2022-10-17 NOTE — Patient Instructions (Addendum)
It was wonderful to see you today! Thank you for choosing [facility].   Please bring ALL of your medications with you to every visit.   Today we talked about:  Your A1c is 8.5! Please continue your current regimen and add on the Metformin 100mg  twice per day for additional control. If you can check your blood sugar in the morning before you eat that would be good. Keep up the good work with the weight loss! For pain please see physical therapy and use the Percocet sparingly. I also increased your Duloxetine to 40mg  daily. Please take two pills of the supply you have left. I will message neurosurgery to see if there is anything else they can do for you. I completed your disability paperwork. If your job has any concerns please let me know. I think I need to see you in early September to see how you are doing and potentially renew your paperwork.  Please follow up in early September  Call the clinic at [phone] if your symptoms worsen or you have any concerns.  Please be sure to schedule follow up at the front desk before you leave today.   Elberta Fortis, DO Family Medicine

## 2022-10-17 NOTE — Telephone Encounter (Signed)
Form completed and given to patient during office visit on 10/17/2022.

## 2022-10-17 NOTE — Progress Notes (Unsigned)
SUBJECTIVE:   CHIEF COMPLAINT / HPI:   Sciatica with L sided radiculopathy Patient reports pain has returned since having the epidural injections in her back. States the day after her last visit the pain returned. Injection only lasted about 5 days of relief. Regressed to difficulty with basic functions including walking, sitting, standing and moving. Discussed disability paperwork, formed completed and handed back to patient. Not scheduled to see NSGY until 2.5 more months, states she cannot afford the co-pay to see them sooner. Seeing PT on 8/20.  MDD Reports she is seeing Behavioral Health on 8/13. Feels the Duloxetine is not helping her much. States she has passive SI but nothing active and denies having a plan to harm herself.  Diabetes Current Regimen: Ozempic 2mg  weekly, Lantus 65U BID and Novolog 15U & 20U  CBGs: Not checking at home  Last A1c:  Lab Results  Component Value Date   HGBA1C 8.5 (A) 10/17/2022    Denies polyuria, polydipsia, hypoglycemia. Last Eye Exam: Due, unable to afford currently Statin: Rosuvastatin 20mg  ACE/ARB: Losartan 25mg  daily  CHF Has not been able to follow up with Cardiology in the past year due to financial barriers. Volume status stable and denies edema. Requesting medication refill.  PERTINENT  PMH / PSH: Sciatica with L sided radiculopathy, T2DM, MDD, CHF  OBJECTIVE:   BP 105/73   Pulse (!) 105   Ht 5\' 11"  (1.803 m)   Wt (!) 327 lb 9.6 oz (148.6 kg)   SpO2 99%   BMI 45.69 kg/m    General: Alert, no apparent distress, well groomed HEENT: Normocephalic, atraumatic, moist mucus membranes, neck supple Respiratory: Normal respiratory effort GI: Non-distended Skin: No rashes, no jaundice Psych: Flat affect, cooperative MSK: Ambulating with cane. Leans significantly to L side. Occasional grimacing.  ASSESSMENT/PLAN:   Essential hypertension, benign 105/73, well controlled. Continue current regimen  CHF (congestive heart  failure) (HCC) Last seen by Cardiology in 05/2021, needs follow up but unable to afford at this time. Stable and asymptomatic so will refill HF meds but encouraged patient to return to care for ongoing monitoring. -Refill Furosemide 40mg  daily, BiDil 20-37.5 TID, Metoprolol 25mg  BID  T2DM (type 2 diabetes mellitus) (HCC) A1c 8.5, increased from 7.7 in 07/2022. May be related to stress and impact on diet as she has been taking meds as prescribed. Not currently checking BLG at home, encouraged patient to check fasting sugar and would like to consider CGM when life circumstances stabilize more. -Refill Ozempic 2mg  weekly, Lantus 65U BID and Novolog 15U & 20U  -Start Metformin 1000mg  BID. Previously taken off due to GFR < 30 but this has since improved. Will monitor kidney function closely  Chronic low back pain with left-sided sciatica Recurrence of pain about ~5 days after epidural injection, now uncontrolled again. Given significant improvement after injections, lumbar spine pathology confirmed as most likely source of pain. Would benefit from NSGY follow up to discuss other options, unable to afford the copay currently therefore I will attempt to contact them about other options. -Increase Duloxetine to 40mg  daily  -Refill Percocet 5-325 q12h prn #30. PDMP reviewed and appropriate. Discussed this is only for severe breakthrough pain and given this has transitioned to more of a chronic state, would not benefit from narcotics long-term. Will not provide additional refills after this visit  MDD (major depressive disorder), recurrent severe, without psychosis (HCC) Seeing BH on 8/13. Denies SI with a plan and mental status appears stable.  -Refill Lorazepam 1mg  q12h  prn. PDMP reviewed and appropriate.   Dr. Elberta Fortis, DO [facility]

## 2022-10-18 NOTE — Assessment & Plan Note (Addendum)
Seeing BH on 8/13. Denies SI with a plan and mental status appears stable.  -Refill Lorazepam 1mg  q12h prn. PDMP reviewed and appropriate.

## 2022-10-18 NOTE — Assessment & Plan Note (Signed)
105/73, well controlled. Continue current regimen

## 2022-10-18 NOTE — Assessment & Plan Note (Signed)
Recurrence of pain about ~5 days after epidural injection, now uncontrolled again. Given significant improvement after injections, lumbar spine pathology confirmed as most likely source of pain. Would benefit from NSGY follow up to discuss other options, unable to afford the copay currently therefore I will attempt to contact them about other options. -Increase Duloxetine to 40mg  daily  -Refill Percocet 5-325 q12h prn #30. PDMP reviewed and appropriate. Discussed this is only for severe breakthrough pain and given this has transitioned to more of a chronic state, would not benefit from narcotics long-term. Will not provide additional refills after this visit

## 2022-10-18 NOTE — Assessment & Plan Note (Addendum)
A1c 8.5, increased from 7.7 in 07/2022. May be related to stress and impact on diet as she has been taking meds as prescribed. Not currently checking BLG at home, encouraged patient to check fasting sugar and would like to consider CGM when life circumstances stabilize more. -Refill Ozempic 2mg  weekly, Lantus 65U BID and Novolog 15U & 20U  -Start Metformin 1000mg  BID. Previously taken off due to GFR < 30 but this has since improved. Will monitor kidney function closely

## 2022-10-18 NOTE — Assessment & Plan Note (Signed)
Last seen by Cardiology in 05/2021, needs follow up but unable to afford at this time. Stable and asymptomatic so will refill HF meds but encouraged patient to return to care for ongoing monitoring. -Refill Furosemide 40mg  daily, BiDil 20-37.5 TID, Metoprolol 25mg  BID

## 2022-10-21 ENCOUNTER — Other Ambulatory Visit: Payer: Self-pay

## 2022-10-21 ENCOUNTER — Telehealth (INDEPENDENT_AMBULATORY_CARE_PROVIDER_SITE_OTHER): Payer: BC Managed Care – PPO | Admitting: Psychiatry

## 2022-10-21 ENCOUNTER — Emergency Department (HOSPITAL_COMMUNITY)
Admission: EM | Admit: 2022-10-21 | Discharge: 2022-10-22 | Disposition: A | Discharge status: Home / Self Care | Payer: BC Managed Care – PPO | Attending: Emergency Medicine | Admitting: Emergency Medicine

## 2022-10-21 ENCOUNTER — Encounter (HOSPITAL_COMMUNITY): Payer: Self-pay | Admitting: Psychiatry

## 2022-10-21 ENCOUNTER — Emergency Department (HOSPITAL_COMMUNITY): Payer: BC Managed Care – PPO

## 2022-10-21 ENCOUNTER — Encounter (HOSPITAL_COMMUNITY): Payer: Self-pay

## 2022-10-21 DIAGNOSIS — Z794 Long term (current) use of insulin: Secondary | ICD-10-CM | POA: Diagnosis not present

## 2022-10-21 DIAGNOSIS — E1122 Type 2 diabetes mellitus with diabetic chronic kidney disease: Secondary | ICD-10-CM | POA: Diagnosis not present

## 2022-10-21 DIAGNOSIS — R000 Tachycardia, unspecified: Secondary | ICD-10-CM | POA: Insufficient documentation

## 2022-10-21 DIAGNOSIS — I509 Heart failure, unspecified: Secondary | ICD-10-CM | POA: Diagnosis not present

## 2022-10-21 DIAGNOSIS — R0789 Other chest pain: Secondary | ICD-10-CM | POA: Insufficient documentation

## 2022-10-21 DIAGNOSIS — R7989 Other specified abnormal findings of blood chemistry: Secondary | ICD-10-CM | POA: Diagnosis not present

## 2022-10-21 DIAGNOSIS — N189 Chronic kidney disease, unspecified: Secondary | ICD-10-CM | POA: Diagnosis not present

## 2022-10-21 DIAGNOSIS — Z91040 Latex allergy status: Secondary | ICD-10-CM | POA: Diagnosis not present

## 2022-10-21 DIAGNOSIS — I130 Hypertensive heart and chronic kidney disease with heart failure and stage 1 through stage 4 chronic kidney disease, or unspecified chronic kidney disease: Secondary | ICD-10-CM | POA: Diagnosis not present

## 2022-10-21 DIAGNOSIS — F32A Depression, unspecified: Secondary | ICD-10-CM | POA: Diagnosis not present

## 2022-10-21 DIAGNOSIS — Z79899 Other long term (current) drug therapy: Secondary | ICD-10-CM | POA: Insufficient documentation

## 2022-10-21 DIAGNOSIS — F411 Generalized anxiety disorder: Secondary | ICD-10-CM | POA: Diagnosis not present

## 2022-10-21 LAB — BASIC METABOLIC PANEL
Anion gap: 10 (ref 5–15)
BUN: 24 mg/dL — ABNORMAL HIGH (ref 6–20)
CO2: 20 mmol/L — ABNORMAL LOW (ref 22–32)
Calcium: 9 mg/dL (ref 8.9–10.3)
Chloride: 110 mmol/L (ref 98–111)
Creatinine, Ser: 1.7 mg/dL — ABNORMAL HIGH (ref 0.44–1.00)
GFR, Estimated: 36 mL/min — ABNORMAL LOW (ref >60)
Glucose, Bld: 51 mg/dL — ABNORMAL LOW (ref 70–99)
Potassium: 3.5 mmol/L (ref 3.5–5.1)
Sodium: 140 mmol/L (ref 135–145)

## 2022-10-21 LAB — CBC
HCT: 37.2 % (ref 36.0–46.0)
Hemoglobin: 12 g/dL (ref 12.0–15.0)
MCH: 27.5 pg (ref 26.0–34.0)
MCHC: 32.3 g/dL (ref 30.0–36.0)
MCV: 85.1 fL (ref 80.0–100.0)
Platelets: 411 10*3/uL — ABNORMAL HIGH (ref 150–400)
RBC: 4.37 MIL/uL (ref 3.87–5.11)
RDW: 12.1 % (ref 11.5–15.5)
WBC: 10 10*3/uL (ref 4.0–10.5)
nRBC: 0 % (ref 0.0–0.2)

## 2022-10-21 LAB — BRAIN NATRIURETIC PEPTIDE: B Natriuretic Peptide: 125.1 pg/mL — ABNORMAL HIGH (ref 0.0–100.0)

## 2022-10-21 LAB — TROPONIN I (HIGH SENSITIVITY): Troponin I (High Sensitivity): 52 ng/L — ABNORMAL HIGH (ref <18)

## 2022-10-21 MED ORDER — SODIUM CHLORIDE 0.9 % IV BOLUS
500.0000 mL | Freq: Once | INTRAVENOUS | Status: AC
  Administered 2022-10-21: 500 mL via INTRAVENOUS

## 2022-10-21 MED ORDER — MIRTAZAPINE 45 MG PO TABS: 45.0000 mg | ORAL_TABLET | Freq: Every day | ORAL | 3 refills | Status: DC

## 2022-10-21 MED ORDER — HYDROXYZINE HCL 50 MG PO TABS: 50.0000 mg | ORAL_TABLET | Freq: Three times a day (TID) | ORAL | 3 refills | Status: DC | PRN

## 2022-10-21 NOTE — Progress Notes (Signed)
BH MD/PA/NP OP Progress Note Virtual Visit via Video Note  I connected with WINNA VOLLE on 10/21/22 at  3:30 PM EDT by a video enabled telemedicine application and verified that I am speaking with the correct person using two identifiers.  Location: Patient:work Provider: Clinic   I discussed the limitations of evaluation and management by telemedicine and the availability of in person appointments. The patient expressed understanding and agreed to proceed.  I provided 30 minutes of non-face-to-face time during this encounter.       10/21/2022 11:36 AM [name]  MRN:  [id number]  Chief Complaint: "I am in so much pain"  HPI: 52 year old female seen today for follow up psychiatric evaluation. She has a psychiatric history of  OCD, adjustment disorder, anxiety, depression, SI, and bipolar disorder. She is currently being managed on  Cymbalta 40 daily (prescribed by PCP), Vistaril 50 mg three times daily, and mirtazapine 30 mg at bedtime.  She informed Clinical research associate that her medications are somewhat effective in managing her psychiatric conditions.  Today she was well-groomed, pleasant, cooperative, and engaged in conversation.She informed Clinical research associate that she is in chronic pain.  She notes that she continues to suffer from sciatica.  She notes her pain is debilitating.  She informed Clinical research associate that is difficult for her to walk, sit up in the car, and attend to her ADLs.  Patient informed Clinical research associate that her son has been helping her.  Because of her chronic pain she notes that she has been having thoughts of suicide.  She however reports that she does not have a plan as she wants to live.  Patient also informed Clinical research associate that her current physical state is preventing her from returning to work which is causing her increased stress.  She informed Clinical research associate that her physical health is exacerbating her mental health.  Today provider conducted a GAD-7 and patient scored a 20.  Provider also conducted PHQ-9 the  patient scored 25.  She endorses passive SI but denies wanting to harm herself.  Today she denies SI/HI/VAH, mania, paranoia. She does note that her sleep is poor and request that mirtazapine be increased.    Patient quantifies her sciatic pain as 10 out of 10.  She notes that she will attempt to restart physical therapy soon but is fearful that the pain will be too overwhelming to complete physical therapy.  Patient informed Clinical research associate that her PCP increased her Cymbalta from 20 mg to 40 mg.  Today provider agreeable to increase mirtazapine 30 mg to 40 mg to help manage sleep.  Provider discussed the risk and benefits of being on 2 antidepressants.  She endorsed understanding.  Patient will continue hydroxyzine as prescribed. No other concerns at this time.    Visit Diagnosis:    ICD-10-CM   1. Generalized anxiety disorder  F41.1 mirtazapine (REMERON) 45 MG tablet    hydrOXYzine (ATARAX) 50 MG tablet    2. Mild depression  F32.A mirtazapine (REMERON) 45 MG tablet        Past Psychiatric History: OCD, adjustment disorder, anxiety, depression, SI, and bipolar disorder.   Past Medical History:  Past Medical History:  Diagnosis Date   Acute renal failure (HCC) 05/27/10   hemodialysis for 6 weeks   Anxiety    Asthma    Depression    Diabetes mellitus    Hypertension    Rhabdomyolysis 06/06/10   after drug overdose   Suicide attempt by drug ingestion (HCC) 06/05/10   result rhabdomyolosis and ARF requrining  dialysis     Past Surgical History:  Procedure Laterality Date   ANKLE ARTHROSCOPY Right 08/17/2015   Procedure: RIGHT ANKLE ARTHROSCOPY WITH SYNOVECTOMY AND LOOSE BODY EXCISION;  Surgeon: Marcene Corning, MD;  Location: [facility] OR;  Service: Orthopedics;  Laterality: Right;   CHOLECYSTECTOMY     TUBAL LIGATION  1998    Family Psychiatric History: Unknown  Family History:  Family History  Problem Relation Age of Onset   Asthma Father     Social History:  Social History    Socioeconomic History   Marital status: Married    Spouse name: Not on file   Number of children: 2   Years of education: Not on file   Highest education level: Not on file  Occupational History   Occupation: Research officer, trade union  Tobacco Use   Smoking status: Never    Passive exposure: Never   Smokeless tobacco: Never  Vaping Use   Vaping status: Never Used  Substance and Sexual Activity   Alcohol use: No   Drug use: No   Sexual activity: Yes    Comment: with husband   Other Topics Concern   Not on file  Social History Narrative   Married high school boyfriend at age 33, two boys, still married but he has been living with other women for years.   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         Social Determinants of Corporate investment banker Strain: Not on file  Food Insecurity: No Food Insecurity (07/08/2022)   Hunger Vital Sign    Worried About Programme researcher, broadcasting/film/video in the Last Year: Never true    Ran Out of Food in the Last Year: Never true  Recent Concern: Food Insecurity - Food Insecurity Present (07/08/2022)   Hunger Vital Sign    Worried About Programme researcher, broadcasting/film/video in the Last Year: Sometimes true    Ran Out of Food in the Last Year: Sometimes true  Transportation Needs: No Transportation Needs (07/08/2022)   PRAPARE - Administrator, Civil Service (Medical): No    Lack of Transportation (Non-Medical): No  Physical Activity: Not on file  Stress: Not on file  Social Connections: Not on file    Allergies:  Allergies  Allergen Reactions   Ace Inhibitors Other (See Comments)    Patient had acute renal failure requiring hemodialysis after a suicide attempt.  Nephro recommended avoiding use.   Haldol [Haloperidol Lactate] Other (See Comments)    Tardive dyskinesia   Nsaids Other (See Comments)    Nephro recommended avoiding after acute renal failure requiring hemodialysis associated with a suicide attempt.    Entresto [Sacubitril-Valsartan] Itching   Latex Other (See Comments)    Rash around IV site    Metabolic Disorder Labs: Lab Results  Component Value Date   HGBA1C 8.5 (A) 10/17/2022   MPG 174.29 07/09/2022   MPG 243.17 11/14/2017   No results found for: "PROLACTIN" Lab Results  Component Value Date   CHOL 201 (H) 04/15/2021   TRIG 231 (H) 04/15/2021   HDL 49 04/15/2021   CHOLHDL 4.1 04/15/2021   VLDL 46 (H) 04/10/2015   LDLCALC 112 (H) 04/15/2021   LDLCALC 125 (H) 12/19/2020   Lab Results  Component Value Date   TSH 1.015 02/20/2022   TSH 2.689 05/19/2021    Therapeutic Level Labs: No results found for: "LITHIUM" No results found for: "VALPROATE" No results  found for: "CBMZ"  Current Medications: Current Outpatient Medications  Medication Sig Dispense Refill   albuterol (VENTOLIN HFA) 108 (90 Base) MCG/ACT inhaler Inhale 2 puffs into the lungs every 6 (six) hours as needed for shortness of breath. 18 g 5   blood glucose meter kit and supplies KIT Dispense based on patient and insurance preference. Use up to four times daily as directed. 1 each 0   DULoxetine (CYMBALTA) 20 MG capsule Take 2 capsules (40 mg total) by mouth daily. 180 capsule 1   fluticasone (FLONASE) 50 MCG/ACT nasal spray Place 1 spray into both nostrils daily. 1 spray in each nostril every day 16 g 1   fluticasone-salmeterol (WIXELA INHUB) 250-50 MCG/ACT AEPB Inhale 1 puff into the lungs in the morning and at bedtime. 60 each 1   furosemide (LASIX) 40 MG tablet Take 1 tablet (40 mg total) by mouth daily. 30 tablet 0   hydrOXYzine (ATARAX) 50 MG tablet Take 1 tablet (50 mg total) by mouth 3 (three) times daily as needed for anxiety. 90 tablet 3   insulin glargine (LANTUS) 100 UNIT/ML Solostar Pen Inject 65 Units into the skin 2 (two) times daily. 15 mL 2   Insulin Pen Needle 32G X 4 MM MISC 1 each by Does not apply route 3 (three) times daily. 300 each 2   isosorbide-hydrALAZINE (BIDIL) 20-37.5 MG  tablet Take 1 tablet by mouth 3 (three) times daily. 90 tablet 0   levothyroxine (SYNTHROID) 300 MCG tablet Take 1 tablet (300 mcg total) by mouth daily at 6 (six) AM. 90 tablet 2   LORazepam (ATIVAN) 1 MG tablet Take 1 tablet (1 mg total) by mouth 2 (two) times daily as needed. for anxiety 45 tablet 0   losartan (COZAAR) 25 MG tablet Take 1 tablet (25 mg total) by mouth at bedtime. 30 tablet 1   metFORMIN (GLUCOPHAGE-XR) 500 MG 24 hr tablet Take 2 tablets (1,000 mg total) by mouth 2 (two) times daily with a meal. 360 tablet 1   methocarbamol (ROBAXIN) 500 MG tablet Take 2 tablets (1,000 mg total) by mouth 2 (two) times daily. 120 tablet 1   metoprolol tartrate (LOPRESSOR) 25 MG tablet Take 1 tablet (25 mg total) by mouth 2 (two) times daily. 60 tablet 0   mirtazapine (REMERON) 45 MG tablet Take 1 tablet (45 mg total) by mouth at bedtime. 30 tablet 3   NOVOLOG FLEXPEN 100 UNIT/ML FlexPen INJECT 15 UNITS SUBCUTANEOUSLY WITH LUNCH AND INJECT 20 UNITS SUBCUTANEOUSLY WITH SUPPER 15 mL 2   oxyCODONE-acetaminophen (PERCOCET) 5-325 MG tablet Take 1 tablet by mouth every 12 (twelve) hours as needed for severe pain. 30 tablet 0   rosuvastatin (CRESTOR) 20 MG tablet Take 1 tablet (20 mg total) by mouth daily. 30 tablet 0   Semaglutide, 2 MG/DOSE, (OZEMPIC, 2 MG/DOSE,) 8 MG/3ML SOPN Inject 2 mg into the skin once per week. 3 mL 2   No current facility-administered medications for this visit.     Musculoskeletal: Strength & Muscle Tone: within normal limits, telehealth visit Gait & Station: normal, telehealth visit Patient leans: N/A  Psychiatric Specialty Exam: Review of Systems  There were no vitals taken for this visit.There is no height or weight on file to calculate BMI.  General Appearance: Well Groomed  Eye Contact:  Good  Speech:  Clear and Coherent and Normal Rate  Volume:  Normal  Mood:  Anxious and Depressed   Affect:  Appropriate and Congruent  Thought Process:  Coherent, Goal Directed  and Linear  Orientation:  Full (Time, Place, and Person)  Thought Content: WDL and Logical   Suicidal Thoughts:  Yes.  without intent/plan  Homicidal Thoughts:  No  Memory:  Immediate;   Good Recent;   Good Remote;   Good  Judgement:  Good  Insight:  Good  Psychomotor Activity:  Normal  Concentration:  Concentration: Good and Attention Span: Good  Recall:  Good  Fund of Knowledge: Good  Language: Good  Akathisia:  No  Handed:  Right  AIMS (if indicated): Not done  Assets:  Communication Skills Desire for Improvement Financial Resources/Insurance Housing Social Support  ADL's:  Intact  Cognition: WNL  Sleep:  Fair   Screenings: AIMS    Flowsheet Row Admission (Discharged) from 02/24/2022 in [facility] INPATIENT BEHAVIORAL MEDICINE  AIMS Total Score 0      AUDIT    Flowsheet Row Admission (Discharged) from 02/24/2022 in [facility] INPATIENT BEHAVIORAL MEDICINE  Alcohol Use Disorder Identification Test Final Score (AUDIT) 0      GAD-7    Flowsheet Row Video Visit from 10/21/2022 in [facility] Video Visit from 05/21/2022 in [facility] Video Visit from 03/26/2022 in [facility]or from 03/20/2022 in [facility] Video Visit from 11/12/2021 in [facility]  Total GAD-7 Score 20 12 14 7 5       PHQ2-9    Flowsheet Row Video Visit from 10/21/2022 in [facility] Office Visit from 10/17/2022 in [facility] Office Visit from 10/06/2022 in [facility] Office Visit from 09/29/2022 in [facility] Office Visit from 09/25/2022 in [facility] Physical Medicine & Rehabilitation  PHQ-2 Total Score 6 6 6 6 6   PHQ-9 Total Score 25 24 24 24 26       Flowsheet Row Video Visit from 10/21/2022 in [facility] ED from 07/12/2022 in [facility] Emergency Department at [facility] ED to Hosp-Admission (Discharged) from 07/08/2022 in [facility] GENERAL MED/SURG UNIT  C-SSRS RISK CATEGORY Error: Q7 should not be populated when Q6 is No No Risk No Risk        Assessment and Plan: Patient endorses symptoms of anxiety and depression which are exacerbated by her sciatic pain.  She also notes that her sleep is poor due to her pain.  Patient Cymbalta was recently increased by her PCP.  Today she is agreeable to increasing mirtazapine 30 mg to 45 mg to help manage anxiety, depression, and sleep.  She will continue hydroxyzine as prescribed.    1. Generalized anxiety disorder  Increased- mirtazapine (REMERON) 45 MG tablet; Take 1 tablet (45 mg total) by mouth at bedtime.  Dispense: 30 tablet; Refill: 3 Continue- hydrOXYzine (ATARAX) 50 MG tablet; Take 1 tablet (50 mg total) by mouth 3 (three) times daily as needed for anxiety.  Dispense: 90 tablet; Refill: 3  2. Mild depression  Increased- mirtazapine (REMERON) 45 MG tablet; Take 1 tablet (45 mg total) by mouth at bedtime.  Dispense: 30 tablet; Refill: 3  Follow-up in 2 months  Shanna Cisco, NP 10/21/2022, 11:36 AM

## 2022-10-21 NOTE — ED Triage Notes (Signed)
Pt brought by EMS from home for CP starting yesterday. Pt began having palpitations tonight around 7:30pm. Pt reports looking at pulse ox and it said HR was 250. EMS reports HR 150 Sinus tach on arrival. HR 120 on arrival to ED. Pt tachypneic and labored on arrival. 100% on RA.

## 2022-10-21 NOTE — ED Provider Notes (Signed)
[place] EMERGENCY DEPARTMENT AT [facility] Provider Note   CSN: [id number] Arrival date & time: 10/21/22  2152     History {Add pertinent medical, surgical, social history, OB history to HPI:1} Chief Complaint  Patient presents with   Palpitations   Chest Pain    [name] is a 52 y.o. female.  Patient presents to the emergency department via EMS complaining of chest pain which initially began yesterday.  She states that over the past few days she has felt unwell with bodyaches and chills.  Her pain is in the left side of her chest, described as a mild pressure.  Tonight she was feeling worse than before and put a pulse ox on her finger, showing a heart rate of 250.  EMS was called and noted a heart rate of 150 upon arrival with sinus tachycardia.  Upon arrival at the emergency department patient was mildly tachypneic and appeared to have labored respirations.  She was maintaining 100% oxygen saturations on room air.  The patient states she has a history of congestive heart failure and has missed medications today due to feeling unwell.  She states she has not had an appetite.  She denies abdominal pain, nausea, vomiting, urinary symptoms, headache at this time.  Past medical history significant for hypertension, type II DM, CKD, bipolar disorder, generalized anxiety disorder, CHF, SVT  HPI     Home Medications Prior to Admission medications   Medication Sig Start Date End Date Taking? Authorizing Provider  albuterol (VENTOLIN HFA) 108 (90 Base) MCG/ACT inhaler Inhale 2 puffs into the lungs every 6 (six) hours as needed for shortness of breath. 09/23/22   Elberta Fortis, MD  blood glucose meter kit and supplies KIT Dispense based on patient and insurance preference. Use up to four times daily as directed. 10/01/21   Sabino Dick, DO  DULoxetine (CYMBALTA) 20 MG capsule Take 2 capsules (40 mg total) by mouth daily. 10/17/22 04/15/23  Elberta Fortis, MD  fluticasone  (FLONASE) 50 MCG/ACT nasal spray Place 1 spray into both nostrils daily. 1 spray in each nostril every day 06/10/21   Sabino Dick, DO  fluticasone-salmeterol (WIXELA INHUB) 250-50 MCG/ACT AEPB Inhale 1 puff into the lungs in the morning and at bedtime. 09/23/22   Elberta Fortis, MD  furosemide (LASIX) 40 MG tablet Take 1 tablet (40 mg total) by mouth daily. 10/17/22   Elberta Fortis, MD  hydrOXYzine (ATARAX) 50 MG tablet Take 1 tablet (50 mg total) by mouth 3 (three) times daily as needed for anxiety. 10/21/22   Shanna Cisco, NP  insulin glargine (LANTUS) 100 UNIT/ML Solostar Pen Inject 65 Units into the skin 2 (two) times daily. 10/17/22   Elberta Fortis, MD  Insulin Pen Needle 32G X 4 MM MISC 1 each by Does not apply route 3 (three) times daily. 04/23/22   Sabino Dick, DO  isosorbide-hydrALAZINE (BIDIL) 20-37.5 MG tablet Take 1 tablet by mouth 3 (three) times daily. 10/17/22   Elberta Fortis, MD  levothyroxine (SYNTHROID) 300 MCG tablet Take 1 tablet (300 mcg total) by mouth daily at 6 (six) AM. 04/22/22   Espinoza, Alejandra, DO  LORazepam (ATIVAN) 1 MG tablet Take 1 tablet (1 mg total) by mouth 2 (two) times daily as needed. for anxiety 10/17/22   Elberta Fortis, MD  losartan (COZAAR) 25 MG tablet Take 1 tablet (25 mg total) by mouth at bedtime. 09/12/22   Elberta Fortis, MD  metFORMIN (GLUCOPHAGE-XR) 500 MG 24 hr tablet Take 2 tablets (  1,000 mg total) by mouth 2 (two) times daily with a meal. 10/17/22   Elberta Fortis, MD  methocarbamol (ROBAXIN) 500 MG tablet Take 2 tablets (1,000 mg total) by mouth 2 (two) times daily. 09/29/22   Elberta Fortis, MD  metoprolol tartrate (LOPRESSOR) 25 MG tablet Take 1 tablet (25 mg total) by mouth 2 (two) times daily. 10/17/22   Elberta Fortis, MD  mirtazapine (REMERON) 45 MG tablet Take 1 tablet (45 mg total) by mouth at bedtime. 10/21/22   Shanna Cisco, NP  NOVOLOG FLEXPEN 100 UNIT/ML FlexPen INJECT 15 UNITS SUBCUTANEOUSLY WITH LUNCH  AND INJECT 20 UNITS SUBCUTANEOUSLY WITH SUPPER 10/17/22   Elberta Fortis, MD  oxyCODONE-acetaminophen (PERCOCET) 5-325 MG tablet Take 1 tablet by mouth every 12 (twelve) hours as needed for severe pain. 10/17/22   Elberta Fortis, MD  rosuvastatin (CRESTOR) 20 MG tablet Take 1 tablet (20 mg total) by mouth daily. 02/26/22   Clapacs, Jackquline Denmark, MD  Semaglutide, 2 MG/DOSE, (OZEMPIC, 2 MG/DOSE,) 8 MG/3ML SOPN Inject 2 mg into the skin once per week. 10/17/22   Elberta Fortis, MD  ipratropium-albuterol (DUONEB) 0.5-2.5 (3) MG/3ML SOLN Take 3 mLs by nebulization 3 (three) times daily as needed. Patient taking differently: Take 3 mLs by nebulization 3 (three) times daily as needed (sob/wheezing). 10/12/20 05/21/21  Fayrene Helper, PA-C      Allergies    Ace inhibitors, Haldol [haloperidol lactate], Nsaids, Entresto [sacubitril-valsartan], and Latex    Review of Systems   Review of Systems  Physical Exam Updated Vital Signs BP (!) 141/107 (BP Location: Right Arm)   Pulse (!) 119   Temp 98.5 F (36.9 C) (Oral)   Resp 19   Ht 5\' 11"  (1.803 m)   Wt (!) 146.5 kg   SpO2 100%   BMI 45.05 kg/m  Physical Exam Vitals and nursing note reviewed.  Constitutional:      General: She is not in acute distress.    Appearance: She is well-developed. She is obese.  HENT:     Head: Normocephalic and atraumatic.  Eyes:     Conjunctiva/sclera: Conjunctivae normal.  Cardiovascular:     Rate and Rhythm: Regular rhythm. Tachycardia present.  Pulmonary:     Effort: Pulmonary effort is normal. No respiratory distress.     Breath sounds: Normal breath sounds.  Chest:     Chest wall: Tenderness present.     Comments: Tenderness to palpation of the left chest Abdominal:     Palpations: Abdomen is soft.     Tenderness: There is no abdominal tenderness.  Musculoskeletal:        General: No swelling.     Cervical back: Neck supple.     Right lower leg: No edema.     Left lower leg: No edema.  Skin:    General:  Skin is warm and dry.     Capillary Refill: Capillary refill takes less than 2 seconds.  Neurological:     Mental Status: She is alert.  Psychiatric:        Mood and Affect: Mood normal.     ED Results / Procedures / Treatments   Labs (all labs ordered are listed, but only abnormal results are displayed) Labs Reviewed  CBC - Abnormal; Notable for the following components:      Result Value   Platelets 411 (*)    All other components within normal limits  BASIC METABOLIC PANEL  BRAIN NATRIURETIC PEPTIDE  TROPONIN I (HIGH SENSITIVITY)    EKG EKG  Interpretation Date/Time:  Tuesday October 21 2022 22:00:43 EDT Ventricular Rate:  115 PR Interval:  148 QRS Duration:  95 QT Interval:  346 QTC Calculation: 479 R Axis:   57  Text Interpretation: Sinus tachycardia no acute st,ts rate increased from prior 12/23 Confirmed by Meridee Score [phone]) on 10/21/2022 10:09:49 PM  Radiology No results found.  Procedures Procedures  {Document cardiac monitor, telemetry assessment procedure when appropriate:1}  Medications Ordered in ED Medications  sodium chloride 0.9 % bolus 500 mL (500 mLs Intravenous New Bag/Given 10/21/22 2227)    ED Course/ Medical Decision Making/ A&P   {   Click here for ABCD2, HEART and other calculatorsREFRESH Note before signing :1}                              Medical Decision Making Amount and/or Complexity of Data Reviewed Labs: ordered. Radiology: ordered.   This patient presents to the ED for concern of chest pain, this involves an extensive number of treatment options, and is a complaint that carries with it a high risk of complications and morbidity.  The differential diagnosis includes ACS, PE, CHF exacerbation, SVT/dysrhythmia, others   Co morbidities that complicate the patient evaluation  Type II DM, hypertension, SVT, CHF   Additional history obtained:  Additional history obtained from EMS External records from outside source  obtained and reviewed including behavioral health notes from earlier today, no noted chest pain during that visit.  Patient complained of 10 out of 10 pain related to her sciatica and was following up for generalized anxiety   Lab Tests:  I Ordered, and personally interpreted labs.  The pertinent results include:  ***   Imaging Studies ordered:  I ordered imaging studies including ***  I independently visualized and interpreted imaging which showed *** I agree with the radiologist interpretation   Cardiac Monitoring: / EKG:  The patient was maintained on a cardiac monitor.  I personally viewed and interpreted the cardiac monitored which showed an underlying rhythm of: ***   Consultations Obtained:  I requested consultation with the ***,  and discussed lab and imaging findings as well as pertinent plan - they recommend: ***   Problem List / ED Course / Critical interventions / Medication management  *** I ordered medication including ***  for ***  Reevaluation of the patient after these medicines showed that the patient {resolved/improved/worsened:23923::"improved"} I have reviewed the patients home medicines and have made adjustments as needed   Social Determinants of Health:  ***   Test / Admission - Considered:  ***   {Document critical care time when appropriate:1} {Document review of labs and clinical decision tools ie heart score, Chads2Vasc2 etc:1}  {Document your independent review of radiology images, and any outside records:1} {Document your discussion with family members, caretakers, and with consultants:1} {Document social determinants of health affecting pt's care:1} {Document your decision making why or why not admission, treatments were needed:1} Final Clinical Impression(s) / ED Diagnoses Final diagnoses:  None    Rx / DC Orders ED Discharge Orders     None

## 2022-10-22 ENCOUNTER — Telehealth (HOSPITAL_COMMUNITY): Payer: BC Managed Care – PPO | Admitting: Psychiatry

## 2022-10-22 MED ORDER — ACETAMINOPHEN 325 MG PO TABS
650.0000 mg | ORAL_TABLET | Freq: Once | ORAL | Status: AC
  Administered 2022-10-22: 650 mg via ORAL
  Filled 2022-10-22: qty 2

## 2022-10-22 MED ORDER — LIDOCAINE 5 % EX PTCH
1.0000 | MEDICATED_PATCH | CUTANEOUS | Status: DC
  Administered 2022-10-22: 1 via TRANSDERMAL
  Filled 2022-10-22: qty 1

## 2022-10-22 NOTE — ED Notes (Signed)
Provided pt with sandwich. 

## 2022-10-22 NOTE — Discharge Instructions (Signed)
You were evaluated today for chest pain.  Your workup was reassuring.  You may buy lidocaine patches over-the-counter to try to use over your chest wall.  I also recommend trying Tylenol since her nephrologist recommends not taking ibuprofen.  I have placed a referral with cardiology.  Please follow-up with your office for further evaluation and management.  You develop a sudden worsening of chest pain, shortness of breath, or other life-threatening symptoms please return to the emergency department.

## 2022-10-25 ENCOUNTER — Telehealth: Payer: Self-pay | Admitting: Family Medicine

## 2022-10-25 NOTE — Telephone Encounter (Signed)
Patient called after-hours line. Called patient back patient said that she is having pain in both of her feet.  She describes it as "barbed wire wrapped around both of the feet".  Says that they feel very swollen and like they feel like sausages that are about to burst.  Says that she usually has sciatica in her left leg but this feels different.  She denies dyspnea orthopnea.  Denies palpitations but does say that she went to the ED about 4 days ago for her heart rate was above 200 at that time.  She is worried about her heart.  Says she has been taking all of her medications consistently (Lasix 40, BiDil, metoprolol, losartan).  Patient did have BNP of 125 which is elevated from her other past reported BNP's around 20-30.  1 year ago she did have a BNP of 720 most likely had an exacerbation at that time.  She has not seen cardiology since 2023 as she says that she is not able to afford it.  3 days ago she did have a mildly elevated troponin that peaked and was trended down.  Does have SVT.  Patient could be having a mild CHF exacerbation possibly secondary to uncontrolled SVT.  However given that she does not have dyspnea at this time did not advise to go to the emergency room.    I advised her to take a second Lasix 40 mg today.  I told her that hopefully this should help with her bilateral lower extremity swelling and to reevaluate tomorrow and take a second Lasix again if necessary.  We did not have any available appointments on Monday.  I scheduled her an appointment with Dr. Deirdre Priest on Wednesday but will message clinic admission regarding any possible appointments on Monday.  I gave her return precautions precautions to go to the ED if she felt increasingly short of breath, heart palpitations, worsening bilateral lower extremity swelling or dizziness.  Patient repeated back to me these symptoms that would make her go to the ED and understood.  She did not have any further questions.  She says that  she does have a friend that is going to stay with her for the next few days.  Lockie Mola, MD  PGY-2 [facility]

## 2022-10-27 ENCOUNTER — Telehealth: Payer: Self-pay | Admitting: Family Medicine

## 2022-10-27 NOTE — Therapy (Addendum)
OUTPATIENT PHYSICAL THERAPY THORACOLUMBAR EVALUATION   Patient Name: MURPHY TARRENCE MRN: [id number] DOB:[birth year], 52 y.o., female Today's Date: 12/04/2022  END OF SESSION:    Past Medical History:  Diagnosis Date   Acute renal failure (HCC) 05/27/10   hemodialysis for 6 weeks   Anxiety    Asthma    Depression    Diabetes mellitus    Hypertension    Rhabdomyolysis 06/06/10   after drug overdose   Suicide attempt by drug ingestion (HCC) 06/05/10   result rhabdomyolosis and ARF requrining dialysis    Past Surgical History:  Procedure Laterality Date   ANKLE ARTHROSCOPY Right 08/17/2015   Procedure: RIGHT ANKLE ARTHROSCOPY WITH SYNOVECTOMY AND LOOSE BODY EXCISION;  Surgeon: Marcene Corning, MD;  Location: [facility] OR;  Service: Orthopedics;  Laterality: Right;   CHOLECYSTECTOMY     TUBAL LIGATION  1998   Patient Active Problem List   Diagnosis Date Noted   Chronic low back pain with left-sided sciatica 07/08/2022   Onychomycosis 10/01/2021   SVT (supraventricular tachycardia) 05/28/2021   Obesity hypoventilation syndrome (HCC) 05/28/2021   CHF (congestive heart failure) (HCC)    Healthcare maintenance 04/15/2021   Arthritis of right ankle 07/25/2020   Bilateral knee pain 03/14/2019   Osteoarthritis 11/24/2018   Bipolar disorder (HCC) 04/29/2017   HLD (hyperlipidemia) 03/19/2017   Asthma 03/14/2017   MDD (major depressive disorder), recurrent severe, 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   Vitamin D deficiency 04/11/2015   Dysmenorrhea 11/25/2013   Suicidal ideation 05/27/2011   Chronic kidney disease (CKD) 11/06/2010   ENDOMETRIAL HYPERPLASIA UNSPECIFIED 02/07/2008   MENORRHAGIA 01/03/2008   Allergic rhinitis 06/24/2007   Essential hypertension, benign 02/03/2007   Hypothyroidism 05/07/2006   T2DM (type 2 diabetes mellitus) (HCC) 05/07/2006   Morbid obesity (HCC) 05/07/2006   Iron deficiency anemia 05/07/2006   Obsessive-compulsive disorder 05/07/2006   Generalized anxiety  disorder 05/07/2006    PCP: Elberta Fortis, MD  REFERRING PROVIDER: Caro Laroche, DO  REFERRING DIAG: [phone] (ICD-10-CM) - Chronic bilateral low back pain with left-sided sciatica   Rationale for Evaluation and Treatment: Rehabilitation  THERAPY DIAG:  Low back pain with left-sided sciatica, unspecified back pain laterality, unspecified chronicity - Plan: PT plan of care cert/re-cert  Other abnormalities of gait and mobility - Plan: PT plan of care cert/re-cert  Muscle weakness (generalized) - Plan: PT plan of care cert/re-cert  ONSET DATE: March 2024  SUBJECTIVE:  SUBJECTIVE STATEMENT: Reports 8/10.   PERTINENT HISTORY:  Chronic low back pain with left-sided sciatica Significant improvement with epidural injection (60% per patient report). Improved mobility and mood upon exam. Continues to have weakness but given patient pain is stable, will get her back over to PT as soon as available to work on strength. Currently supposed to return to work on 10/27/2022 but I feel she is not quite ready. I am encouraged by her progress and feel she can continue improving to return to work in the fall. -Messaged PT, requested we place new referral. Referral placed and advised patient to call and try to schedule therapy this week -Contacted [employer] HR who confirmed they just need a note to extend patient time off work. Faxed letter to extend time off until 11/2022 and sent copy to patient  PAIN:  Are you having pain? Yes: NPRS scale: 8/10 Pain location: LLE and low back Pain description: ache, radiating Aggravating factors: sitting, standing, walking Relieving factors: lying supine  PRECAUTIONS: None  RED FLAGS: None   WEIGHT BEARING RESTRICTIONS: No  FALLS:  Has patient fallen in  last 6 months? No  OCCUPATION: [employer]  PLOF: Independent  PATIENT GOALS: To reduce my symptoms and return to work  NEXT MD VISIT: 11/14/22 Ardyth Harps  OBJECTIVE:   DIAGNOSTIC FINDINGS:   IMPRESSION: 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     Electronically Signed   By: Feliberto Harts M.D.   On: 07/06/2022 16:00    PATIENT SURVEYS:  FOTO 0.5 (28 predicted)  MUSCLE LENGTH: Hamstrings: Right 80 deg; Left 60 deg with low back pain Thomas test: Negative L  POSTURE:  Patient ambulates with a QC R hand,  antalgic gait pattern, unable to maintain upright posture in sitting and leans excessively to L to alleviate WB on R   PALPATION: Deferred due to body habitus  LUMBAR ROM: deferred due to pain and patient apprehension regarding falling  AROM eval  Flexion   Extension   Right lateral flexion   Left lateral flexion   Right rotation   Left rotation    (Blank rows = not tested)  LOWER EXTREMITY ROM:     A/PROM Right eval Left eval  Hip flexion 100/120d 100 P!/120d  Hip extension    Hip abduction    Hip adduction    Hip internal rotation 0d 0d  Hip external rotation    Knee flexion    Knee extension    Ankle dorsiflexion    Ankle plantarflexion    Ankle inversion    Ankle eversion     (Blank rows = not tested)  LOWER EXTREMITY MMT:    MMT Right eval Left eval  Hip flexion 4 4-  Hip extension 4 4-  Hip abduction 4 4-  Hip adduction    Hip internal rotation    Hip external rotation    Knee flexion    Knee extension    Ankle dorsiflexion    Ankle plantarflexion    Ankle inversion    Ankle eversion     (Blank rows = not tested)  LUMBAR SPECIAL TESTS:  Straight leg raise test: inconclusive for radiating pain and Slump test: Positive on L  FUNCTIONAL TESTS:  5 times sit to stand: UTA due  to patient apprehension 30 seconds chair stand test UTA due to patient apprehension  GAIT: Distance walked: 77ft x2 Assistive  device utilized: Quad cane small base Level of assistance: Modified independence Comments: antalgic gait pattern  TODAY'S TREATMENT:   10/28/2022 Eval                                                                                                                             PATIENT EDUCATION:  Education details: Eval findings, POC, HEP, self care Person educated: Patient Education method: Explanation Education comprehension: verbalized understanding and needs further education  HOME EXERCISE PROGRAM: Access Code: [email or web address] URL: [email or web address] Date: 10/28/2022 Prepared by: Gustavus Bryant  Exercises - Clamshell  - 2 x daily - 5 x weekly - 2 sets - 10 reps - Supine Bridge  - 2 x daily - 5 x weekly - 2 sets - 10 reps - Seated Table Hamstring Stretch  - 2 x daily - 5 x weekly - 1 sets - 2 reps - 30s hold - Seated Sciatic Tensioner  - 2 x daily - 5 x weekly - 1 sets - 10 reps  ASSESSMENT:  CLINICAL IMPRESSION: patient referred to OPPT for continued L radicular pain.  Patient has previously undergone 6 week of Physical Therapy consisting of exercises, stretching, manual techniques and TPDN w/o lasting benefit.  She has undergone 2 ESI w/o lasting relief of symptoms.  Unable to assess 5x STS due to patient apprehension and fear of falling.  She ambulates with a QC R for balance and stability, swithing to L on occasion.  Palpation finds exquisite TTP to L piriformis however shifts to L while seated for pain relief.  FOTO score is 0.5.  SLR and sciatic tension signs inconclusive for neuro irritation.  Patient easily able to change positions going from supine to prone and tolerating prone on elbows w/o symptom exacerbation.  Patient does not demonstrate a clear and consistent pattern to pain and aggravating/relieving factors.  Progress guarde at this  time.  OBJECTIVE IMPAIRMENTS: Abnormal gait, decreased activity tolerance, decreased endurance, decreased knowledge of condition, decreased knowledge of use of DME, decreased mobility, difficulty walking, decreased ROM, decreased strength, impaired perceived functional ability, impaired flexibility, postural dysfunction, obesity, and pain.   ACTIVITY LIMITATIONS: carrying, lifting, bending, sitting, standing, squatting, stairs, and locomotion level  PERSONAL FACTORS: Behavior pattern, Fitness, Past/current experiences, and Time since onset of injury/illness/exacerbation are also affecting patient's functional outcome.   REHAB POTENTIAL: Fair based on unsuccessful conservative treatment to date  CLINICAL DECISION MAKING: Evolving/moderate complexity  EVALUATION COMPLEXITY: Moderate   GOALS:  SHORT TERM GOALS: Target date: 11/11/2022  Patient to demonstrate independence in HEP  Baseline: [email or web address] Goal status: INITIAL    LONG TERM GOALS: Target date: 11/25/2022    Increase FOTO score to 28 Baseline: 0.5 Goal status: INITIAL  2.  Increase B hip IR to 10d Baseline:  A/PROM Right eval Left eval  Hip flexion 100/120d 100 P!/120d  Hip extension    Hip abduction    Hip adduction    Hip internal rotation 0d 0d  Goal status: INITIAL  3.  Increase L hip strength to 4/5 Baseline:  MMT Right eval Left eval  Hip flexion 4 4-  Hip extension 4 4-  Hip abduction 4 4-   Goal status: INITIAL  4.  Decrease worst pain to 6/10 Baseline: 8/10 Goal status: INITIAL   PLAN:  PT FREQUENCY: 2x/week  PT DURATION: 4 weeks  PLANNED INTERVENTIONS: Therapeutic exercises, Therapeutic activity, Neuromuscular re-education, Balance training, Gait training, Patient/Family education, Self Care, Joint mobilization, DME instructions, Aquatic Therapy, Dry Needling, Electrical stimulation, Spinal mobilization, Cryotherapy, Moist heat, Manual therapy, and Re-evaluation.  PLAN FOR NEXT SESSION:  HEP review and update, manual techniques as appropriate, aerobic tasks, ROM and flexibility activities, strengthening and PREs, TPDN, gait and balance training as needed     Hildred Laser, PT 12/04/2022, 12:12 PM

## 2022-10-27 NOTE — Telephone Encounter (Signed)
Patient's son dropped off form at front desk for Disability.  Verified that patient section of form has been completed.  Last DOS/WCC with PCP was 10/21/22.  Placed form in red team folder to be completed by clinical staff.  Vilinda Blanks

## 2022-10-28 ENCOUNTER — Other Ambulatory Visit: Payer: Self-pay

## 2022-10-28 ENCOUNTER — Ambulatory Visit: Payer: BC Managed Care – PPO | Attending: Family Medicine

## 2022-10-28 DIAGNOSIS — M5442 Lumbago with sciatica, left side: Secondary | ICD-10-CM | POA: Diagnosis present

## 2022-10-28 DIAGNOSIS — R2689 Other abnormalities of gait and mobility: Secondary | ICD-10-CM | POA: Insufficient documentation

## 2022-10-28 DIAGNOSIS — M6281 Muscle weakness (generalized): Secondary | ICD-10-CM | POA: Diagnosis present

## 2022-10-28 NOTE — Telephone Encounter (Signed)
 Form has been placed in your box, please finish when you have time. Thank you! Penni Bombard CMA

## 2022-10-29 ENCOUNTER — Ambulatory Visit: Payer: BC Managed Care – PPO | Admitting: Family Medicine

## 2022-10-29 NOTE — Progress Notes (Deleted)
    SUBJECTIVE:   CHIEF COMPLAINT / HPI:   ***  PERTINENT  PMH / PSH: Diabetes, SVT, CHF, Bipolar, CKD, Hypothyroid    OBJECTIVE:   There were no vitals taken for this visit.  ***  ASSESSMENT/PLAN:   There are no diagnoses linked to this encounter.   There are no Patient Instructions on file for this visit.   Carney Living, MD [facility]

## 2022-11-03 NOTE — Telephone Encounter (Signed)
Patient calls nurse line to check on status of FMLA paperwork.   Please advise.   Veronda Prude, RN

## 2022-11-03 NOTE — Telephone Encounter (Signed)
Patient called checking the status of FMLA paperwork. She said the place is hounding her about the paperwork.   Please call patient as soon as paperwork is ready.   Please Advise.   Thanks!

## 2022-11-04 NOTE — Telephone Encounter (Signed)
Provided paperwork to Nehemiah Settle to attach medical records. She will call and let patient know once this is ready.   Copy of provider completed paperwork made and placed in batch scanning.   Veronda Prude, RN

## 2022-11-04 NOTE — Telephone Encounter (Signed)
Completed long-term disability paperwork and attached letter for support for ongoing medical management. Placed in RN box and will need office notes from 07/03/2022 to present attached.   Elberta Fortis, DO

## 2022-11-06 NOTE — Therapy (Deleted)
OUTPATIENT PHYSICAL THERAPY THORACOLUMBAR EVALUATION   Patient Name: [name] MRN: [id number] DOB:05-12-1970, 52 y.o., female Today's Date: 11/06/2022  END OF SESSION:    Past Medical History:  Diagnosis Date   Acute renal failure (HCC) 05/27/10   hemodialysis for 6 weeks   Anxiety    Asthma    Depression    Diabetes mellitus    Hypertension    Rhabdomyolysis 06/06/10   after drug overdose   Suicide attempt by drug ingestion (HCC) 06/05/10   result rhabdomyolosis and ARF requrining dialysis    Past Surgical History:  Procedure Laterality Date   ANKLE ARTHROSCOPY Right 08/17/2015   Procedure: RIGHT ANKLE ARTHROSCOPY WITH SYNOVECTOMY AND LOOSE BODY EXCISION;  Surgeon: Marcene Corning, MD;  Location: [facility] OR;  Service: Orthopedics;  Laterality: Right;   CHOLECYSTECTOMY     TUBAL LIGATION  1998   Patient Active Problem List   Diagnosis Date Noted   Chronic low back pain with left-sided sciatica 07/08/2022   Onychomycosis 10/01/2021   SVT (supraventricular tachycardia) 05/28/2021   Obesity hypoventilation syndrome (HCC) 05/28/2021   CHF (congestive heart failure) (HCC)    Healthcare maintenance 04/15/2021   Arthritis of right ankle 07/25/2020   Bilateral knee pain 03/14/2019   Osteoarthritis 11/24/2018   Bipolar disorder (HCC) 04/29/2017   HLD (hyperlipidemia) 03/19/2017   Asthma 03/14/2017   MDD (major depressive disorder), recurrent severe, 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   Vitamin D deficiency 04/11/2015   Dysmenorrhea 11/25/2013   Suicidal ideation 05/27/2011   Chronic kidney disease (CKD) 11/06/2010   ENDOMETRIAL HYPERPLASIA UNSPECIFIED 02/07/2008   MENORRHAGIA 01/03/2008   Allergic rhinitis 06/24/2007   Essential hypertension, benign 02/03/2007   Hypothyroidism 05/07/2006   T2DM (type 2 diabetes mellitus) (HCC) 05/07/2006   Morbid obesity (HCC) 05/07/2006   Iron deficiency anemia 05/07/2006   Obsessive-compulsive disorder 05/07/2006   Generalized anxiety  disorder 05/07/2006    PCP: Elberta Fortis, MD  REFERRING PROVIDER: Caro Laroche, DO  REFERRING DIAG: [phone] (ICD-10-CM) - Chronic bilateral low back pain with left-sided sciatica   Rationale for Evaluation and Treatment: Rehabilitation  THERAPY DIAG:  No diagnosis found.  ONSET DATE: March 2024  SUBJECTIVE:                                                                                                                                                                                           SUBJECTIVE STATEMENT: Reports 8/10.   PERTINENT HISTORY:  Chronic low back pain with left-sided sciatica Significant improvement with epidural injection (60% per patient report). Improved mobility and mood upon exam. Continues to have weakness but given patient pain is stable,  will get her back over to PT as soon as available to work on strength. Currently supposed to return to work on 10/27/2022 but I feel she is not quite ready. I am encouraged by her progress and feel she can continue improving to return to work in the fall. -Messaged PT, requested we place new referral. Referral placed and advised patient to call and try to schedule therapy this week -Contacted [employer] HR who confirmed they just need a note to extend patient time off work. Faxed letter to extend time off until 11/2022 and sent copy to patient  PAIN:  Are you having pain? Yes: NPRS scale: 8/10 Pain location: LLE and low back Pain description: ache, radiating Aggravating factors: sitting, standing, walking Relieving factors: lying supine  PRECAUTIONS: None  RED FLAGS: None   WEIGHT BEARING RESTRICTIONS: No  FALLS:  Has patient fallen in last 6 months? No  OCCUPATION: [employer]  PLOF: Independent  PATIENT GOALS: To reduce my symptoms and return to work  NEXT MD VISIT: 11/14/22 Ardyth Harps  OBJECTIVE:   DIAGNOSTIC FINDINGS:   IMPRESSION: Multilevel degenerate age, greatest at  at L4-L5 where there are bilateral facet joint effusions with perifacet inflammatory change/arthropathy and mild bilateral foraminal stenosis.     Electronically Signed   By: Feliberto Harts M.D.   On: 07/06/2022 16:00    PATIENT SURVEYS:  FOTO 0.5 (28 predicted)  MUSCLE LENGTH: Hamstrings: Right 80 deg; Left 60 deg with low back pain Thomas test: Negative L  POSTURE:  Patient ambulates with a QC R hand,  antalgic gait pattern, unable to maintain upright posture in sitting and leans excessively to L to alleviate WB on R   PALPATION: Deferred due to body habitus  LUMBAR ROM: deferred due to pain and patient apprehension regarding falling  AROM eval  Flexion   Extension   Right lateral flexion   Left lateral flexion   Right rotation   Left rotation    (Blank rows = not tested)  LOWER EXTREMITY ROM:     A/PROM Right eval Left eval  Hip flexion 100/120d 100 P!/120d  Hip extension    Hip abduction    Hip adduction    Hip internal rotation 0d 0d  Hip external rotation    Knee flexion    Knee extension    Ankle dorsiflexion    Ankle plantarflexion    Ankle inversion    Ankle eversion     (Blank rows = not tested)  LOWER EXTREMITY MMT:    MMT Right eval Left eval  Hip flexion 4 4-  Hip extension 4 4-  Hip abduction 4 4-  Hip adduction    Hip internal rotation    Hip external rotation    Knee flexion    Knee extension    Ankle dorsiflexion    Ankle plantarflexion    Ankle inversion    Ankle eversion     (Blank rows = not tested)  LUMBAR SPECIAL TESTS:  Straight leg raise test: inconclusive for radiating pain and Slump test: Positive on L  FUNCTIONAL TESTS:  5 times sit to stand: UTA due to patient apprehension 30 seconds chair stand test UTA due to patient apprehension  GAIT: Distance walked: 83ft x2 Assistive device utilized: Quad cane small base Level of assistance: Modified independence Comments: antalgic gait pattern  TODAY'S TREATMENT:  PATIENT EDUCATION:  Education details: Eval findings, POC, HEP, self care Person educated: Patient Education method: Explanation Education comprehension: verbalized understanding and needs further education  HOME EXERCISE PROGRAM: Access Code: [email or web address] URL: [email or web address] Date: 10/28/2022 Prepared by: Gustavus Bryant  Exercises - Clamshell  - 2 x daily - 5 x weekly - 2 sets - 10 reps - Supine Bridge  - 2 x daily - 5 x weekly - 2 sets - 10 reps - Seated Table Hamstring Stretch  - 2 x daily - 5 x weekly - 1 sets - 2 reps - 30s hold - Seated Sciatic Tensioner  - 2 x daily - 5 x weekly - 1 sets - 10 reps  ASSESSMENT:  CLINICAL IMPRESSION: patient referred to OPPT for continued L radicular pain.  Patient has previously undergone 6 week of Physical Therapy consisting of exercises, stretching, manual techniques and TPDN w/o lasting benefit.  She has undergone 2 ESI w/o lasting relief of symptoms.  Unable to assess 5x STS due to patient apprehension and fear of falling.  She ambulates with a QC R for balance and stability, swithing to L on occasion.  Palpation finds exquisite TTP to L piriformis however shifts to L while seated for pain relief.  FOTO score is 0.5.  SLR and sciatic tension signs inconclusive for neuro irritation.  Patient easily able to change positions going from supine to prone and tolerating prone on elbows w/o symptom exacerbation.  Patient does not demonstrate a clear and consistent pattern to pain and aggravating/relieving factors.  Progress guarde at this time.  OBJECTIVE IMPAIRMENTS: Abnormal gait, decreased activity tolerance, decreased endurance, decreased knowledge of condition, decreased knowledge of use of DME, decreased mobility, difficulty walking, decreased ROM, decreased strength, impaired perceived functional ability,  impaired flexibility, postural dysfunction, obesity, and pain.   ACTIVITY LIMITATIONS: carrying, lifting, bending, sitting, standing, squatting, stairs, and locomotion level  PERSONAL FACTORS: Behavior pattern, Fitness, Past/current experiences, and Time since onset of injury/illness/exacerbation are also affecting patient's functional outcome.   REHAB POTENTIAL: Fair based on unsuccessful conservative treatment to date  CLINICAL DECISION MAKING: Evolving/moderate complexity  EVALUATION COMPLEXITY: Moderate   GOALS:  SHORT TERM GOALS: Target date: 11/11/2022  Patient to demonstrate independence in HEP  Baseline: [email or web address] Goal status: INITIAL    LONG TERM GOALS: Target date: 11/25/2022    Increase FOTO score to 28 Baseline: 0.5 Goal status: INITIAL  2.  Increase B hip IR to 10d Baseline:  A/PROM Right eval Left eval  Hip flexion 100/120d 100 P!/120d  Hip extension    Hip abduction    Hip adduction    Hip internal rotation 0d 0d   Goal status: INITIAL  3.  Increase L hip strength to 4/5 Baseline:  MMT Right eval Left eval  Hip flexion 4 4-  Hip extension 4 4-  Hip abduction 4 4-   Goal status: INITIAL  4.  Decrease worst pain to 6/10 Baseline: 8/10 Goal status: INITIAL   PLAN:  PT FREQUENCY: 2x/week  PT DURATION: 4 weeks  PLANNED INTERVENTIONS: Therapeutic exercises, Therapeutic activity, Neuromuscular re-education, Balance training, Gait training, Patient/Family education, Self Care, Joint mobilization, DME instructions, Dry Needling, Electrical stimulation, Spinal mobilization, Cryotherapy, Moist heat, Manual therapy, and Re-evaluation.  PLAN FOR NEXT SESSION: HEP review and update, manual techniques as appropriate, aerobic tasks, ROM and flexibility activities, strengthening and PREs, TPDN, gait and balance training as needed     Hildred Laser, PT 11/06/2022, 10:33 AM

## 2022-11-07 ENCOUNTER — Ambulatory Visit: Payer: BC Managed Care – PPO

## 2022-11-14 ENCOUNTER — Encounter: Payer: Self-pay | Admitting: Family Medicine

## 2022-11-14 ENCOUNTER — Telehealth: Payer: Self-pay | Admitting: Family Medicine

## 2022-11-14 ENCOUNTER — Ambulatory Visit (INDEPENDENT_AMBULATORY_CARE_PROVIDER_SITE_OTHER): Payer: BC Managed Care – PPO | Admitting: Family Medicine

## 2022-11-14 VITALS — BP 104/59 | HR 110 | Ht 71.0 in | Wt 323.0 lb

## 2022-11-14 DIAGNOSIS — F332 Major depressive disorder, recurrent severe without psychotic features: Secondary | ICD-10-CM

## 2022-11-14 DIAGNOSIS — Z23 Encounter for immunization: Secondary | ICD-10-CM

## 2022-11-14 DIAGNOSIS — E1122 Type 2 diabetes mellitus with diabetic chronic kidney disease: Secondary | ICD-10-CM | POA: Diagnosis not present

## 2022-11-14 DIAGNOSIS — G8929 Other chronic pain: Secondary | ICD-10-CM

## 2022-11-14 DIAGNOSIS — Z0000 Encounter for general adult medical examination without abnormal findings: Secondary | ICD-10-CM

## 2022-11-14 DIAGNOSIS — Z794 Long term (current) use of insulin: Secondary | ICD-10-CM

## 2022-11-14 DIAGNOSIS — M5442 Lumbago with sciatica, left side: Secondary | ICD-10-CM

## 2022-11-14 NOTE — Progress Notes (Signed)
    SUBJECTIVE:   CHIEF COMPLAINT / HPI:   Low back pain with L sided sciatica Chronic ongoing issue. Seen by PT on 10/28/2022, reports the new therapist she saw was unsure of mobility goals per patient. Felt she had a good experience with prior PT and would like to see her again. Requesting shower chair and transfer bench as getting in and out of the shower has become very difficulty. Fearful she is going to fall in the shower. Patient is wondering about additional NSGY options, has been financially limited in returning to the clinic unless possible intervention available.  Requesting additional paperwork monthly for disability verification at work. Reports she has started to finally receive disability benefits. States she has job secured until 08/2023, wondering if she will get better enough to go back. States she is motivated to get better however possible.  PERTINENT  PMH / PSH: Sciatica with L sided radiculopathy, T2DM, MDD, CHF, CKD4  OBJECTIVE:   BP (!) 104/59   Pulse (!) 110   Ht 5\' 11"  (1.803 m)   Wt (!) 323 lb (146.5 kg)   LMP 10/18/2022   SpO2 99%   BMI 45.05 kg/m    General: Alert, no apparent distress, well groomed HEENT: Normocephalic, atraumatic, moist mucus membranes, neck supple Respiratory: Normal respiratory effort GI: Non-distended Skin: No rashes, no jaundice Psych: Tearful at times. Denies active SI but states she does have passive SI at times. MSK: Leaning significantly towards left side. Ambulates slowly with cane. Occasional grimacing with movement. ROM significantly limited by pain.  ASSESSMENT/PLAN:   Chronic low back pain with left-sided sciatica Chronic, uncontrolled symptoms. Started back with PT with goals to increase mobility and strength and preventative help with deconditioning. Interested in further NSGY options if available. On a positive note, appears that disability paperwork is in order to maintain benefits during further evaluation and  treatment. -Messaged PT about care goals. Per patient preference would like to see previous PT who is familiar with her care -Reached out to NSGY for discussion about further treatment options available. Will communicate with patient the results -DME order placed for shower chair and tub transfer bench -Plan to complete monthly paperwork to maintain disability benefits  MDD (major depressive disorder), recurrent severe, without psychosis (HCC) PHQ: 24, positive for SI. Passive thoughts but denies active plan. Seeing NP Doyne Keel for [employer], Mirtazapine increased to help with sleep at last visit. -Reiterated safety planning with patient  T2DM (type 2 diabetes mellitus) (HCC) A1c mildly increased at prior visit. Patient has not been able to check BGL at home. -Encouraged patient to check fasting sugar and f/u with log at next visit  Healthcare maintenance Due to Pap and colonoscopy, reiterated importance to patient and will continue discussion as symptoms improve   Dr. Elberta Fortis, DO [facility]

## 2022-11-14 NOTE — Assessment & Plan Note (Signed)
PHQ: 24, positive for SI. Passive thoughts but denies active plan. Seeing NP Doyne Keel for [employer], Mirtazapine increased to help with sleep at last visit. -Reiterated safety planning with patient

## 2022-11-14 NOTE — Assessment & Plan Note (Signed)
Due to Pap and colonoscopy, reiterated importance to patient and will continue discussion as symptoms improve

## 2022-11-14 NOTE — Assessment & Plan Note (Signed)
A1c mildly increased at prior visit. Patient has not been able to check BGL at home. -Encouraged patient to check fasting sugar and f/u with log at next visit

## 2022-11-14 NOTE — Assessment & Plan Note (Addendum)
Chronic, uncontrolled symptoms. Started back with PT with goals to increase mobility and strength and preventative help with deconditioning. Interested in further NSGY options if available. On a positive note, appears that disability paperwork is in order to maintain benefits during further evaluation and treatment. -Messaged PT about care goals. Per patient preference would like to see previous PT who is familiar with her care -Reached out to NSGY for discussion about further treatment options available. Will communicate with patient the results -DME order placed for shower chair and tub transfer bench -Plan to complete monthly paperwork to maintain disability benefits

## 2022-11-14 NOTE — Telephone Encounter (Signed)
Patient requesting DME shower chair and tub transfer bench due to ongoing severe low back pain with left-sided radiculopathy.  Ordered during office visit on 9/6.  Forward to [facility] RN team for further processing.  Elberta Fortis, DO

## 2022-11-14 NOTE — Patient Instructions (Addendum)
It was wonderful to see you today! Thank you for choosing [facility].   Please bring ALL of your medications with you to every visit.   Today we talked about:  I will reach out to neurosurgery and physical therapy as we discussed to help coordinate care. I will also send an order to our RN for shower chair and transfer bench to help with mobility. Please keep checking your blood sugar every morning and keep a log. You are due for Pap and colonoscopy and we can continue to discuss this as your symptoms improve.  Please follow up in 1 month after neurosurgery injection for pain check  Call the clinic at [phone] if your symptoms worsen or you have any concerns.  Please be sure to schedule follow up at the front desk before you leave today.   Samantha Fortis, DO Family Medicine

## 2022-11-17 ENCOUNTER — Telehealth: Payer: Self-pay

## 2022-11-17 ENCOUNTER — Ambulatory Visit: Payer: BC Managed Care – PPO

## 2022-11-17 ENCOUNTER — Other Ambulatory Visit: Payer: Self-pay | Admitting: Family Medicine

## 2022-11-17 DIAGNOSIS — F411 Generalized anxiety disorder: Secondary | ICD-10-CM

## 2022-11-17 DIAGNOSIS — G8929 Other chronic pain: Secondary | ICD-10-CM

## 2022-11-17 NOTE — Telephone Encounter (Signed)
PDMP reviewed and appropriate. Last refilled Lorazepam #[id number] on 8/9. Recent office and behavioral health visit.

## 2022-11-17 NOTE — Telephone Encounter (Signed)
Community message sent to [employer]. Will await response.   Hannah C Pipkin, RN  

## 2022-11-17 NOTE — Telephone Encounter (Signed)
Patient calls nurse line regarding concerns with continued sciatic pain. She reports that she woke up today and left leg "hurts so bad that I cannot even walk on it."   She was supposed to have PT today, however, had to cancel due to inability to leave home due to pain.   She wanted me to send message to provider to provide update. Also, would patient be able to receive referral for home health PT due to not being able to leave home due to pain?   Will forward request to PCP.   *Patient also asking about order for medical equipment (shower chair and tub transfer bench). Community message sent to [employer].   Veronda Prude, RN

## 2022-11-17 NOTE — Telephone Encounter (Signed)
Please have orders updated to state [place] patient exceeds 300 pounds so we need order to reflect that for insurance purposes.  thanks!   Please see the above message regarding medical equipment from [employer].   Veronda Prude, RN

## 2022-11-18 NOTE — Telephone Encounter (Signed)
Community message sent to [employer] with updated order.   Hannah C Pipkin, RN  

## 2022-11-18 NOTE — Addendum Note (Signed)
Addended by: Elberta Fortis on: 11/18/2022 08:53 AM   Modules accepted: Orders

## 2022-11-18 NOTE — Telephone Encounter (Addendum)
Given patient has to exhibit considerable and taxing effort to leave the home safely due to pain, orders placed for home health PT.   Elberta Fortis, DO

## 2022-11-18 NOTE — Telephone Encounter (Signed)
Called patient and she has been informed. Penni Bombard CMA

## 2022-11-19 ENCOUNTER — Telehealth: Payer: Self-pay

## 2022-11-19 ENCOUNTER — Ambulatory Visit: Payer: BC Managed Care – PPO | Attending: Family Medicine

## 2022-11-19 DIAGNOSIS — M5442 Lumbago with sciatica, left side: Secondary | ICD-10-CM | POA: Insufficient documentation

## 2022-11-19 DIAGNOSIS — R2689 Other abnormalities of gait and mobility: Secondary | ICD-10-CM | POA: Insufficient documentation

## 2022-11-19 DIAGNOSIS — M6281 Muscle weakness (generalized): Secondary | ICD-10-CM | POA: Diagnosis present

## 2022-11-19 NOTE — Telephone Encounter (Signed)
Patient calls nurse line requesting an updated work note.   She reports she was supposed to return back to work this month, however it was decided at last PCP OV she would wait until "the end of November."   She reports she needs this in writing and faxed to her employer.   She reports she would like the note to state return on 12/2 which is the first Monday in December.  Once letter is written we need to fax to [facility] attention [place] at [phone].

## 2022-11-19 NOTE — Therapy (Signed)
OUTPATIENT PHYSICAL THERAPY TREATMENT   Patient Name: [name] MRN: [id number] DOB:[birth year], 52 y.o., female Today's Date: 11/19/2022  END OF SESSION:  PT End of Session - 11/19/22 1258     Visit Number 2    Number of Visits 8    Date for PT Re-Evaluation 12/28/22    Authorization Type [employer]    PT Start Time 1259    PT Stop Time 1340    PT Time Calculation (min) 41 min    Activity Tolerance Patient limited by pain    Behavior During Therapy Restless;Anxious              Past Medical History:  Diagnosis Date   Acute renal failure (HCC) 05/27/10   hemodialysis for 6 weeks   Anxiety    Asthma    Depression    Diabetes mellitus    Hypertension    Rhabdomyolysis 06/06/10   after drug overdose   Suicide attempt by drug ingestion (HCC) 06/05/10   result rhabdomyolosis and ARF requrining dialysis    Past Surgical History:  Procedure Laterality Date   ANKLE ARTHROSCOPY Right 08/17/2015   Procedure: RIGHT ANKLE ARTHROSCOPY WITH SYNOVECTOMY AND LOOSE BODY EXCISION;  Surgeon: Marcene Corning, MD;  Location: [facility] OR;  Service: Orthopedics;  Laterality: Right;   CHOLECYSTECTOMY     TUBAL LIGATION  1998   Patient Active Problem List   Diagnosis Date Noted   Chronic low back pain with left-sided sciatica 07/08/2022   Onychomycosis 10/01/2021   SVT (supraventricular tachycardia) 05/28/2021   Obesity hypoventilation syndrome (HCC) 05/28/2021   CHF (congestive heart failure) (HCC)    Healthcare maintenance 04/15/2021   Arthritis of right ankle 07/25/2020   Bilateral knee pain 03/14/2019   Osteoarthritis 11/24/2018   Bipolar disorder (HCC) 04/29/2017   HLD (hyperlipidemia) 03/19/2017   Asthma 03/14/2017   MDD (major depressive disorder), recurrent severe, 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   Vitamin D deficiency 04/11/2015   Dysmenorrhea 11/25/2013   Suicidal ideation 05/27/2011   Chronic kidney disease (CKD) 11/06/2010   ENDOMETRIAL HYPERPLASIA UNSPECIFIED 02/07/2008    MENORRHAGIA 01/03/2008   Allergic rhinitis 06/24/2007   Essential hypertension, benign 02/03/2007   Hypothyroidism 05/07/2006   T2DM (type 2 diabetes mellitus) (HCC) 05/07/2006   Morbid obesity (HCC) 05/07/2006   Iron deficiency anemia 05/07/2006   Obsessive-compulsive disorder 05/07/2006   Generalized anxiety disorder 05/07/2006    PCP: Elberta Fortis, MD  REFERRING PROVIDER: Caro Laroche, DO  REFERRING DIAG: [phone] (ICD-10-CM) - Chronic bilateral low back pain with left-sided sciatica   Rationale for Evaluation and Treatment: Rehabilitation  THERAPY DIAG:  Low back pain with left-sided sciatica, unspecified back pain laterality, unspecified chronicity  Other abnormalities of gait and mobility  Muscle weakness (generalized)  ONSET DATE: March 2024  SUBJECTIVE:  SUBJECTIVE STATEMENT: Patient reports that her pain is at a "1000" today and that she is unable to perform her home exercises due to the pain. She states that she is unable to do anything because of the pain and that she does not want to keep living like this.    PERTINENT HISTORY:  See PMH  PAIN:  Are you having pain?  Yes: NPRS scale: "1000"/10 Pain location: LLE and low back Pain description: ache, radiating Aggravating factors: sitting, standing, walking Relieving factors: lying supine  PRECAUTIONS: None  RED FLAGS: None   WEIGHT BEARING RESTRICTIONS: No  FALLS:  Has patient fallen in last 6 months? No  OCCUPATION: [employer]  PLOF: Independent  PATIENT GOALS: To reduce my symptoms and return to work  NEXT MD VISIT: 11/14/22 Ardyth Harps  OBJECTIVE:   DIAGNOSTIC FINDINGS:   IMPRESSION: Multilevel degenerate age, greatest at at L4-L5 where there are bilateral facet joint effusions with  perifacet inflammatory change/arthropathy and mild bilateral foraminal stenosis.     Electronically Signed   By: Feliberto Harts M.D.   On: 07/06/2022 16:00    PATIENT SURVEYS:  FOTO 0.5 (28 predicted)  MUSCLE LENGTH: Hamstrings: Right 80 deg; Left 60 deg with low back pain Thomas test: Negative L  POSTURE:  Patient ambulates with a QC R hand,  antalgic gait pattern, unable to maintain upright posture in sitting and leans excessively to L to alleviate WB on R   PALPATION: Deferred due to body habitus  LUMBAR ROM: deferred due to pain and patient apprehension regarding falling  AROM eval  Flexion   Extension   Right lateral flexion   Left lateral flexion   Right rotation   Left rotation    (Blank rows = not tested)  LOWER EXTREMITY ROM:     A/PROM Right eval Left eval  Hip flexion 100/120d 100 P!/120d  Hip extension    Hip abduction    Hip adduction    Hip internal rotation 0d 0d  Hip external rotation    Knee flexion    Knee extension    Ankle dorsiflexion    Ankle plantarflexion    Ankle inversion    Ankle eversion     (Blank rows = not tested)  LOWER EXTREMITY MMT:    MMT Right eval Left eval  Hip flexion 4 4-  Hip extension 4 4-  Hip abduction 4 4-  Hip adduction    Hip internal rotation    Hip external rotation    Knee flexion    Knee extension    Ankle dorsiflexion    Ankle plantarflexion    Ankle inversion    Ankle eversion     (Blank rows = not tested)  LUMBAR SPECIAL TESTS:  Straight leg raise test: inconclusive for radiating pain and Slump test: Positive on L  FUNCTIONAL TESTS:  5 times sit to stand: UTA due to patient apprehension 30 seconds chair stand test UTA due to patient apprehension  GAIT: Distance walked: 41ft x2 Assistive device utilized: Quad cane small base Level of assistance: Modified independence Comments: antalgic gait pattern Screening for Suicide  Answer the following questions with Yes or No and place an  "x" beside the action taken.  1. Over the past two weeks, have you felt down, depressed, or hopeless?  Yes   2. Within the past two weeks, have you felt little interest or pleasure in life?  Yes   If YES to either #1 or #2, then ask #3  3. Have you had thoughts that that life is not worth living or that you might be       better off dead?   Yes   If answer is NO and suspicion is low, then end   4. Over this past week, have you had any thoughts about hurting or even killing yourself?  Yes   If NO, then end. Patient in no immediate danger   5. If so, do you believe that you intend to or will harm yourself?  Yes      If NO, then end. Patient in no immediate danger   6.  Do you have a plan as to how you would hurt yourself?  No    7.  Over this past week, have you actually done anything to hurt yourself? No    IF YES answers to either #4, #5, #6 or #7, then patient is AT RISK for suicide   Actions Taken  ____  Screening negative; no further action required  __x__  Screening positive; no immediate danger and patient already in treatment with a  mental health provider. Advise patient to speak to their mental health provider.  ___  Screening positive; no immediate danger. Patient advised to contact a mental  health provider for further assessment.   ____  Screening positive; in immediate danger as patient states intention of killing self,  has plan and a sense of imminence. Do not leave alone. Seek permission from  patient to contact a family member to inform them. Direct patient to go to ED.   TREATMENT: [facility] Adult PT Treatment:                                                DATE: 11/19/2022 Therapeutic Activity/Education: Pain neuroscience education Pain management strategies including heat, ice, gentle movement, and neuromuscular regulation strategies including focusing on attainable tasks instead of pain, promotion of self-efficacy and advocacy. Ex. Walking to and  sitting on porch or in garden Mental health resources including 988 and behavioral health team Modalities: MHP patient in Lt sidelying                                                                                                                         PATIENT EDUCATION:  Education details: Eval findings, POC, HEP, self care Person educated: Patient Education method: Explanation Education comprehension: verbalized understanding and needs further education  HOME EXERCISE PROGRAM: Access Code: [email or web address] URL: [email or web address] Date: 10/28/2022 Prepared by: Gustavus Bryant  Exercises - Clamshell  - 2 x daily - 5 x weekly - 2 sets - 10 reps - Supine Bridge  - 2 x daily - 5 x weekly - 2 sets - 10 reps - Seated Table Hamstring Stretch  - 2 x daily - 5 x weekly - 1 sets - 2 reps - 30s hold -  Seated Sciatic Tensioner  - 2 x daily - 5 x weekly - 1 sets - 10 reps  ASSESSMENT:  CLINICAL IMPRESSION:  Patient arrives to PT in significant pain and unable to sit without leaning heavily onto Lt hip/side. Provided MHP in sidelying which provides minimal relief. SI screen performed this session with patient showing positive signs of SI, currently in behavioral health for management and patient is aware of resources available to her including [id number] and her current behavioral health provider. Session today required increased time for SI screen and patient education on pain science to improve understanding of pain experience and independence with pain modulation strategies. She in interested in aquatic therapy for pain modulation and to to improve activity tolerance gradually. If aquatic therapy does not improve activity and pain, will recommend she return to her MD for further work up.  (EVAL):patient referred to [facility] for continued L radicular pain.  Patient has previously undergone 6 week of Physical Therapy consisting of exercises, stretching, manual techniques and TPDN w/o lasting benefit.   She has undergone 2 ESI w/o lasting relief of symptoms.  Unable to assess 5x STS due to patient apprehension and fear of falling.  She ambulates with a QC R for balance and stability, swithing to L on occasion.  Palpation finds exquisite TTP to L piriformis however shifts to L while seated for pain relief.  FOTO score is 0.5.  SLR and sciatic tension signs inconclusive for neuro irritation.  Patient easily able to change positions going from supine to prone and tolerating prone on elbows w/o symptom exacerbation.  Patient does not demonstrate a clear and consistent pattern to pain and aggravating/relieving factors.  Progress guarde at this time.  OBJECTIVE IMPAIRMENTS: Abnormal gait, decreased activity tolerance, decreased endurance, decreased knowledge of condition, decreased knowledge of use of DME, decreased mobility, difficulty walking, decreased ROM, decreased strength, impaired perceived functional ability, impaired flexibility, postural dysfunction, obesity, and pain.   ACTIVITY LIMITATIONS: carrying, lifting, bending, sitting, standing, squatting, stairs, and locomotion level  PERSONAL FACTORS: Behavior pattern, Fitness, Past/current experiences, and Time since onset of injury/illness/exacerbation are also affecting patient's functional outcome.   REHAB POTENTIAL: Fair based on unsuccessful conservative treatment to date  CLINICAL DECISION MAKING: Evolving/moderate complexity  EVALUATION COMPLEXITY: Moderate   GOALS:  SHORT TERM GOALS: Target date: 11/11/2022  Patient to demonstrate independence in HEP  Baseline: [id number] Goal status: INITIAL    LONG TERM GOALS: Target date: 11/25/2022    Increase FOTO score to 28 Baseline: 0.5 Goal status: INITIAL  2.  Increase B hip IR to 10d Baseline:  A/PROM Right eval Left eval  Hip flexion 100/120d 100 P!/120d  Hip extension    Hip abduction    Hip adduction    Hip internal rotation 0d 0d   Goal status: INITIAL  3.  Increase L  hip strength to 4/5 Baseline:  MMT Right eval Left eval  Hip flexion 4 4-  Hip extension 4 4-  Hip abduction 4 4-   Goal status: INITIAL  4.  Decrease worst pain to 6/10 Baseline: 8/10 Goal status: INITIAL   PLAN:  PT FREQUENCY: 2x/week  PT DURATION: 4 weeks  PLANNED INTERVENTIONS: Therapeutic exercises, Therapeutic activity, Neuromuscular re-education, Balance training, Gait training, Patient/Family education, Self Care, Joint mobilization, DME instructions, Dry Needling, Electrical stimulation, Spinal mobilization, Cryotherapy, Moist heat, Manual therapy, and Re-evaluation.  PLAN FOR NEXT SESSION: HEP review and update, manual techniques as appropriate, aerobic tasks, ROM and flexibility activities, strengthening and PREs, TPDN, gait  and balance training as needed     Berta Minor, PTA 11/19/2022, 5:53 PM

## 2022-11-24 ENCOUNTER — Ambulatory Visit: Payer: BC Managed Care – PPO

## 2022-11-26 ENCOUNTER — Ambulatory Visit: Payer: BC Managed Care – PPO

## 2022-11-27 NOTE — Therapy (Addendum)
OUTPATIENT PHYSICAL THERAPY TREATMENT/DC SUMMARY   Patient Name: [name] MRN: [id number] DOB:04-Sep-1970, 52 y.o., female Today's Date: 11/28/2022 PHYSICAL THERAPY DISCHARGE SUMMARY  Visits from Start of Care: 3  Current functional level related to goals / functional outcomes: Goals not met   Remaining deficits: Pain   Education / Equipment: HEP   Patient agrees to discharge. Patient goals were not met. Patient is being discharged due to did not respond to therapy.   END OF SESSION:  PT End of Session - 11/28/22 1219     Visit Number 3    Number of Visits 8    Date for PT Re-Evaluation 12/28/22    Authorization Type BCBS    PT Start Time 1220    PT Stop Time 1306    PT Time Calculation (min) 46 min    Activity Tolerance Patient limited by pain    Behavior During Therapy Restless;Anxious               Past Medical History:  Diagnosis Date   Acute renal failure (HCC) 05/27/10   hemodialysis for 6 weeks   Anxiety    Asthma    Depression    Diabetes mellitus    Hypertension    Rhabdomyolysis 06/06/10   after drug overdose   Suicide attempt by drug ingestion (HCC) 06/05/10   result rhabdomyolosis and ARF requrining dialysis    Past Surgical History:  Procedure Laterality Date   ANKLE ARTHROSCOPY Right 08/17/2015   Procedure: RIGHT ANKLE ARTHROSCOPY WITH SYNOVECTOMY AND LOOSE BODY EXCISION;  Surgeon: Marcene Corning, MD;  Location: [facility] OR;  Service: Orthopedics;  Laterality: Right;   CHOLECYSTECTOMY     TUBAL LIGATION  1998   Patient Active Problem List   Diagnosis Date Noted   Chronic low back pain with left-sided sciatica 07/08/2022   Onychomycosis 10/01/2021   SVT (supraventricular tachycardia) 05/28/2021   Obesity hypoventilation syndrome (HCC) 05/28/2021   CHF (congestive heart failure) (HCC)    Healthcare maintenance 04/15/2021   Arthritis of right ankle 07/25/2020   Bilateral knee pain 03/14/2019   Osteoarthritis 11/24/2018   Bipolar disorder  (HCC) 04/29/2017   HLD (hyperlipidemia) 03/19/2017   Asthma 03/14/2017   MDD (major depressive disorder), recurrent severe, 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   Vitamin D deficiency 04/11/2015   Dysmenorrhea 11/25/2013   Suicidal ideation 05/27/2011   Chronic kidney disease (CKD) 11/06/2010   ENDOMETRIAL HYPERPLASIA UNSPECIFIED 02/07/2008   MENORRHAGIA 01/03/2008   Allergic rhinitis 06/24/2007   Essential hypertension, benign 02/03/2007   Hypothyroidism 05/07/2006   T2DM (type 2 diabetes mellitus) (HCC) 05/07/2006   Morbid obesity (HCC) 05/07/2006   Iron deficiency anemia 05/07/2006   Obsessive-compulsive disorder 05/07/2006   Generalized anxiety disorder 05/07/2006    PCP: Elberta Fortis, MD  REFERRING PROVIDER: Caro Laroche, DO  REFERRING DIAG: [phone] (ICD-10-CM) - Chronic bilateral low back pain with left-sided sciatica   Rationale for Evaluation and Treatment: Rehabilitation  THERAPY DIAG:  Low back pain with left-sided sciatica, unspecified back pain laterality, unspecified chronicity  Other abnormalities of gait and mobility  Muscle weakness (generalized)  ONSET DATE: March 2024  SUBJECTIVE:  SUBJECTIVE STATEMENT: Patient reports continued high levels of pain and states that the pain runs from her lower back to her posterior thigh and then to the medial portion of her distal LE to her foot. She states that she has numbness and tingling in BIL feet.    PERTINENT HISTORY:  See PMH  PAIN:  Are you having pain?  Yes: NPRS scale: "1000"/10 Pain location: LLE and low back Pain description: ache, radiating Aggravating factors: sitting, standing, walking Relieving factors: lying supine  PRECAUTIONS: None  RED FLAGS: None   WEIGHT BEARING RESTRICTIONS: No  FALLS:   Has patient fallen in last 6 months? No  OCCUPATION: [employer]  PLOF: Independent  PATIENT GOALS: To reduce my symptoms and return to work  NEXT MD VISIT: 11/14/22 Ardyth Harps  OBJECTIVE:   DIAGNOSTIC FINDINGS:   IMPRESSION: 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     Electronically Signed   By: Feliberto Harts M.D.   On: 07/06/2022 16:00    PATIENT SURVEYS:  FOTO 0.5 (28 predicted)  MUSCLE LENGTH: Hamstrings: Right 80 deg; Left 60 deg with low back pain Thomas test: Negative L  POSTURE:  Patient ambulates with a QC R hand,  antalgic gait pattern, unable to maintain upright posture in sitting and leans excessively to L to alleviate WB on R   PALPATION: Deferred due to body habitus  LUMBAR ROM: deferred due to pain and patient apprehension regarding falling  AROM eval  Flexion   Extension   Right lateral flexion   Left lateral flexion   Right rotation   Left rotation    (Blank rows = not tested)  LOWER EXTREMITY ROM:     A/PROM Right eval Left eval  Hip flexion 100/120d 100 P!/120d  Hip extension    Hip abduction    Hip adduction    Hip internal rotation 0d 0d  Hip external rotation    Knee flexion    Knee extension    Ankle dorsiflexion    Ankle plantarflexion    Ankle inversion    Ankle eversion     (Blank rows = not tested)  LOWER EXTREMITY MMT:    MMT Right eval Left eval  Hip flexion 4 4-  Hip extension 4 4-  Hip abduction 4 4-  Hip adduction    Hip internal rotation    Hip external rotation    Knee flexion    Knee extension    Ankle dorsiflexion    Ankle plantarflexion    Ankle inversion    Ankle eversion     (Blank rows = not tested)  LUMBAR SPECIAL TESTS:  Straight leg raise test: inconclusive for radiating pain and Slump test: Positive on L  FUNCTIONAL TESTS:  5  times sit to stand: UTA due to patient apprehension 30 seconds chair stand test UTA due to patient apprehension  GAIT: Distance walked: 56ft x2 Assistive device utilized: Quad cane small base Level of assistance: Modified independence Comments: antalgic gait pattern Screening for Suicide  Answer the following questions with Yes or No and place an "x" beside the action taken.  1. Over the past two weeks, have you felt down, depressed, or hopeless?  Yes   2. Within the past two weeks, have you felt little interest or pleasure in life?  Yes   If YES to either #1 or #2, then ask #3  3. Have you  had thoughts that that life is not worth living or that you might be       better off dead?   Yes   If answer is NO and suspicion is low, then end   4. Over this past week, have you had any thoughts about hurting or even killing yourself?  Yes   If NO, then end. Patient in no immediate danger   5. If so, do you believe that you intend to or will harm yourself?  Yes      If NO, then end. Patient in no immediate danger   6.  Do you have a plan as to how you would hurt yourself?  No    7.  Over this past week, have you actually done anything to hurt yourself? No    IF YES answers to either #4, #5, #6 or #7, then patient is AT RISK for suicide   Actions Taken  ____  Screening negative; no further action required  __x__  Screening positive; no immediate danger and patient already in treatment with a  mental health provider. Advise patient to speak to their mental health provider.  ___  Screening positive; no immediate danger. Patient advised to contact a mental  health provider for further assessment.   ____  Screening positive; in immediate danger as patient states intention of killing self,  has plan and a sense of imminence. Do not leave alone. Seek permission from  patient to contact a family member to inform them. Direct patient to go to ED.   TREATMENT: [facility] Adult PT  Treatment:                                                DATE: 11/28/22 Aquatic therapy at [facility]- [address] - therapeutic pool temp approximately 92 degrees. Pt enters building in wheelchair accompanied by son. Treatment took place in water 3.8 to 4 ft 8 in. deep depending upon activity.  Pt entered and exited the pool via stair and handrails independently with step to pattern. Patient entered water for aquatic therapy for first time and was introduced to principles and therapeutic effects of water as they ambulated and acclimated to pool.  Aquatic Exercise: Holding onto edge of pool, patient leaned over onto Lt side- integrate to water and educating patient on principals of water, pain neuroscience continuation from previous session, and focus on breathing UE support at pool edge: Hip abduction Hip circles Marching Sitting on steps LAQ Lt (leaning heavily onto Lt hip)  Pt requires the buoyancy of water for active assisted exercises with buoyancy supported for strengthening and AROM exercises. Hydrostatic pressure also supports joints by unweighting joint load by at least 50 % in 3-4 feet depth water. 80% in chest to neck deep water. Water will provide assistance with movement using the current and laminar flow while the buoyancy reduces weight bearing. Pt requires the viscosity of the water for resistance with strengthening exercises.   [facility] Adult PT Treatment:                                                DATE: 11/19/2022 Therapeutic Activity/Education: Pain neuroscience education Pain management strategies including heat, ice, gentle movement, and neuromuscular  regulation strategies including focusing on attainable tasks instead of pain, promotion of self-efficacy and advocacy. Ex. Walking to and sitting on porch or in garden Mental health resources including [phone] and behavioral health team Modalities: MHP patient in Lt sidelying                                                                                                                          PATIENT EDUCATION:  Education details: Eval findings, POC, HEP, self care Person educated: Patient Education method: Explanation Education comprehension: verbalized understanding and needs further education  HOME EXERCISE PROGRAM: Access Code: [email or web address] URL: [email or web address] Date: 10/28/2022 Prepared by: Gustavus Bryant  Exercises - Clamshell  - 2 x daily - 5 x weekly - 2 sets - 10 reps - Supine Bridge  - 2 x daily - 5 x weekly - 2 sets - 10 reps - Seated Table Hamstring Stretch  - 2 x daily - 5 x weekly - 1 sets - 2 reps - 30s hold - Seated Sciatic Tensioner  - 2 x daily - 5 x weekly - 1 sets - 10 reps  ASSESSMENT:  CLINICAL IMPRESSION:  Patient presents to first aquatic PT session reporting continued high levels of baseline pain. Session today focused on integrating principals of the water, weight shifting onto LLE as tolerable, and breathing in the aquatic environment for use of buoyancy to offload joints and the viscosity of water as resistance during therapeutic exercise. She remain very limited by pain and anxiety during session, but is eager to continue with water therapy. Will attempt to a few more aquatic sessions and if progress is not made, will return to land for potential DC.     (EVAL):patient referred to [facility] for continued L radicular pain.  Patient has previously undergone 6 week of Physical Therapy consisting of exercises, stretching, manual techniques and TPDN w/o lasting benefit.  She has undergone 2 ESI w/o lasting relief of symptoms.  Unable to assess 5x STS due to patient apprehension and fear of falling.  She ambulates with a QC R for balance and stability, swithing to L on occasion.  Palpation finds exquisite TTP to L piriformis however shifts to L while seated for pain relief.  FOTO score is 0.5.  SLR and sciatic tension signs inconclusive for neuro irritation.  Patient easily  able to change positions going from supine to prone and tolerating prone on elbows w/o symptom exacerbation.  Patient does not demonstrate a clear and consistent pattern to pain and aggravating/relieving factors.  Progress guarde at this time.  OBJECTIVE IMPAIRMENTS: Abnormal gait, decreased activity tolerance, decreased endurance, decreased knowledge of condition, decreased knowledge of use of DME, decreased mobility, difficulty walking, decreased ROM, decreased strength, impaired perceived functional ability, impaired flexibility, postural dysfunction, obesity, and pain.   ACTIVITY LIMITATIONS: carrying, lifting, bending, sitting, standing, squatting, stairs, and locomotion level  PERSONAL FACTORS: Behavior pattern, Fitness, Past/current experiences, and Time since onset of injury/illness/exacerbation are also affecting patient's functional outcome.  REHAB POTENTIAL: Fair based on unsuccessful conservative treatment to date  CLINICAL DECISION MAKING: Evolving/moderate complexity  EVALUATION COMPLEXITY: Moderate   GOALS:  SHORT TERM GOALS: Target date: 11/11/2022  Patient to demonstrate independence in HEP  Baseline: [id number] Goal status: INITIAL    LONG TERM GOALS: Target date: 11/25/2022    Increase FOTO score to 28 Baseline: 0.5 Goal status: INITIAL  2.  Increase B hip IR to 10d Baseline:  A/PROM Right eval Left eval  Hip flexion 100/120d 100 P!/120d  Hip extension    Hip abduction    Hip adduction    Hip internal rotation 0d 0d   Goal status: INITIAL  3.  Increase L hip strength to 4/5 Baseline:  MMT Right eval Left eval  Hip flexion 4 4-  Hip extension 4 4-  Hip abduction 4 4-   Goal status: INITIAL  4.  Decrease worst pain to 6/10 Baseline: 8/10 Goal status: INITIAL   PLAN:  PT FREQUENCY: 2x/week  PT DURATION: 4 weeks  PLANNED INTERVENTIONS: Therapeutic exercises, Therapeutic activity, Neuromuscular re-education, Balance training, Gait training,  Patient/Family education, Self Care, Joint mobilization, DME instructions, Dry Needling, Electrical stimulation, Spinal mobilization, Cryotherapy, Moist heat, Manual therapy, and Re-evaluation.  PLAN FOR NEXT SESSION: HEP review and update, manual techniques as appropriate, aerobic tasks, ROM and flexibility activities, strengthening and PREs, TPDN, gait and balance training as needed     Berta Minor, PTA 11/28/2022, 1:09 PM

## 2022-11-28 ENCOUNTER — Ambulatory Visit: Payer: BC Managed Care – PPO

## 2022-11-28 DIAGNOSIS — R2689 Other abnormalities of gait and mobility: Secondary | ICD-10-CM

## 2022-11-28 DIAGNOSIS — M5442 Lumbago with sciatica, left side: Secondary | ICD-10-CM | POA: Diagnosis not present

## 2022-11-28 DIAGNOSIS — M6281 Muscle weakness (generalized): Secondary | ICD-10-CM

## 2022-12-01 ENCOUNTER — Telehealth: Payer: Self-pay

## 2022-12-01 NOTE — Telephone Encounter (Signed)
Pharmacy Patient Advocate Encounter   Received notification from CoverMyMeds that prior authorization for Ozempic 2mg  dose pens is required/requested.   Insurance verification completed.    PA required; PA submitted to [facility] via CoverMyMeds Key/confirmation #/EOC [id number]. Status is pending

## 2022-12-01 NOTE — Telephone Encounter (Signed)
Pharmacy Patient Advocate Encounter  Received notification from [facility] that Prior Authorization for [facility] has been APPROVED from 12/01/22 to 11/30/25

## 2022-12-03 ENCOUNTER — Ambulatory Visit: Payer: BC Managed Care – PPO

## 2022-12-04 NOTE — Addendum Note (Signed)
Addended by: Hildred Laser on: 12/04/2022 12:24 PM   Modules accepted: Orders

## 2022-12-05 ENCOUNTER — Ambulatory Visit: Payer: BC Managed Care – PPO

## 2022-12-08 ENCOUNTER — Telehealth: Payer: Self-pay | Admitting: Family Medicine

## 2022-12-08 ENCOUNTER — Other Ambulatory Visit: Payer: Self-pay

## 2022-12-08 DIAGNOSIS — I5042 Chronic combined systolic (congestive) and diastolic (congestive) heart failure: Secondary | ICD-10-CM

## 2022-12-08 MED ORDER — METOPROLOL SUCCINATE ER 50 MG PO TB24: 50.0000 mg | ORAL_TABLET | Freq: Every day | ORAL | 3 refills | Status: DC

## 2022-12-08 NOTE — Telephone Encounter (Signed)
patient dropped off form at front desk for Short-Term Disability .  Verified that patient section of form has been completed.  Last DOS/WCC with PCP was 11/14/22.  Placed form in red team folder to be completed by clinical staff.  Vilinda Blanks

## 2022-12-09 NOTE — Telephone Encounter (Signed)
Placed in MDs box to be filled out. [name], CMA  

## 2022-12-10 ENCOUNTER — Telehealth: Payer: Self-pay

## 2022-12-10 NOTE — Telephone Encounter (Signed)
Patient calls nurse line requesting to speak with PCP.   She reports at her last office visit it was discussed that PCP would reach out to Neurologist for a "back scraping" procedure.   She reports she would like to know the "status" of this request.   She reports continued pain and low quality of life.   Advised will send to PCP.

## 2022-12-11 ENCOUNTER — Ambulatory Visit (INDEPENDENT_AMBULATORY_CARE_PROVIDER_SITE_OTHER): Payer: BC Managed Care – PPO | Admitting: Family Medicine

## 2022-12-11 ENCOUNTER — Other Ambulatory Visit: Payer: Self-pay

## 2022-12-11 VITALS — BP 102/64 | HR 134 | Ht 71.0 in | Wt 317.4 lb

## 2022-12-11 DIAGNOSIS — M5442 Lumbago with sciatica, left side: Secondary | ICD-10-CM

## 2022-12-11 DIAGNOSIS — R45851 Suicidal ideations: Secondary | ICD-10-CM

## 2022-12-11 DIAGNOSIS — G8929 Other chronic pain: Secondary | ICD-10-CM | POA: Diagnosis not present

## 2022-12-11 DIAGNOSIS — M5432 Sciatica, left side: Secondary | ICD-10-CM

## 2022-12-11 MED ORDER — DULOXETINE HCL 40 MG PO CPEP
40.0000 mg | ORAL_CAPSULE | Freq: Two times a day (BID) | ORAL | 1 refills | Status: DC
Start: 2022-12-11 — End: 2023-03-12

## 2022-12-11 NOTE — Patient Instructions (Signed)
It was wonderful to see you today.  Please bring ALL of your medications with you to every visit.   Today we talked about:  Sciatica and back pain - I recommend you go to the hospital given your sudden changes in symptoms. I would recommend getting additional imagining. I increased your medication to help with nighttime symptoms.   Mood- For your mood and suicidal thoughts I highly recommend following up with your therapist and psychiatrist. I am worried and if you ever feel like you have a plan please call [phone].    Thank you for choosing [facility].   Please call [phone] with any questions about today's appointment.  Please be sure to schedule follow up at the front desk before you leave today.   Lockie Mola, MD  Family Medicine

## 2022-12-11 NOTE — Progress Notes (Signed)
    SUBJECTIVE:   CHIEF COMPLAINT / HPI:   Chronic Lower Back Pain/ Sciatica  Patient has chronic back pain that she says is still bothering her. Patient says that she keeps coming to clinic because a solution has not been found for her back pain. Says that it is not significantly worse. Says the only medication that works for her is Microbiologist. Says that she is taking gabapentin and cymbalta but that it does not help her. Reports that her neuropathy has been bothering her especially at night. This has been causing great distress. Patient is able to walk; however, says that it is affecting her daily functioning as she is not able to walk long distances. Has tried physical therapy. Says that since two days ago she has had two episodes of bowel and bladder incontinence that is new for her. No sudden weakness in her legs or arms. However, she did have headache and vision changes that lasted a couple of hours on 10/1 according to patient. These symptoms have since resolved. Patient says that epidural injections have not helped in the past.   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 Says that she called [phone] number yesterday night. Has thought about going to the train tracks to end her life. Feels like she is helpless. No current plan to end her life. Does not own a gun. Lives with husband and son.   PERTINENT  PMH / PSH: Chronic pain syndrome, Sciatica, SVT, HTN, T2DM, MDD  OBJECTIVE:   LMP 10/18/2022   General: Uncomfortable appearing CV: RRR, radial pulses equal and palpable, no BLE edema  Resp: Normal work of breathing on room air, CTAB Abd: Soft, non tender, non distended  MSK: No midline or paravertebral tenderness to palpation of back, distractable tenderness Neuro: PERRLA, EOMI, CN2-12 normal, Upper extremity and lower extremity strength 5/5.    ASSESSMENT/PLAN:   Assessment & Plan Chronic bilateral low back pain  with left-sided sciatica Patient has chronic pain syndrome. Neuropathy also contributing. Do not believe opioids would be helpful in this situation. Patient's acute report of bowel and bladder incontinence is concerning and would warrant urgent imaging; however, reassured by patient's normal neuro exam.  - increased cymbalta to 40 mg BID to assist with neuropathy at nighttime.  - Recommended patient go to ED for acute neuro changes, but patient declines, recommended follow up with PCP and given ED precautions  Passive suicidal ideations Exacerbated by chronic pain syndrome. Patient does not have plan at this time and has support system. Recommended patient seek treatment at hospital, patient declines. Patient's mood would not improve with treatment of solely mood as pain is exacerbating mood symptoms.  - given suicide precautions and resources  - Follow up in 1 month      Lockie Mola, MD [facility]

## 2022-12-12 ENCOUNTER — Ambulatory Visit: Payer: BC Managed Care – PPO

## 2022-12-12 NOTE — Therapy (Deleted)
OUTPATIENT PHYSICAL THERAPY TREATMENT   Patient Name: [name] MRN: [id number] DOB:11-May-1970, 52 y.o., female Today's Date: 12/12/2022  END OF SESSION:      Past Medical History:  Diagnosis Date   Acute renal failure (HCC) 05/27/10   hemodialysis for 6 weeks   Anxiety    Asthma    Depression    Diabetes mellitus    Hypertension    Rhabdomyolysis 06/06/10   after drug overdose   Suicide attempt by drug ingestion (HCC) 06/05/10   result rhabdomyolosis and ARF requrining dialysis    Past Surgical History:  Procedure Laterality Date   ANKLE ARTHROSCOPY Right 08/17/2015   Procedure: RIGHT ANKLE ARTHROSCOPY WITH SYNOVECTOMY AND LOOSE BODY EXCISION;  Surgeon: Marcene Corning, MD;  Location: [facility] OR;  Service: Orthopedics;  Laterality: Right;   CHOLECYSTECTOMY     TUBAL LIGATION  1998   Patient Active Problem List   Diagnosis Date Noted   Chronic low back pain with left-sided sciatica 07/08/2022   Onychomycosis 10/01/2021   SVT (supraventricular tachycardia) (HCC) 05/28/2021   Obesity hypoventilation syndrome (HCC) 05/28/2021   CHF (congestive heart failure) (HCC)    Healthcare maintenance 04/15/2021   Arthritis of right ankle 07/25/2020   Bilateral knee pain 03/14/2019   Osteoarthritis 11/24/2018   Bipolar disorder (HCC) 04/29/2017   HLD (hyperlipidemia) 03/19/2017   Asthma 03/14/2017   MDD (major depressive disorder), recurrent severe, 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   Vitamin D deficiency 04/11/2015   Dysmenorrhea 11/25/2013   Suicidal ideation 05/27/2011   Chronic kidney disease (CKD) 11/06/2010   ENDOMETRIAL HYPERPLASIA UNSPECIFIED 02/07/2008   MENORRHAGIA 01/03/2008   Allergic rhinitis 06/24/2007   Essential hypertension, benign 02/03/2007   Hypothyroidism 05/07/2006   T2DM (type 2 diabetes mellitus) (HCC) 05/07/2006   Morbid obesity (HCC) 05/07/2006   Iron deficiency anemia 05/07/2006   Obsessive-compulsive disorder 05/07/2006   Generalized anxiety  disorder 05/07/2006    PCP: Elberta Fortis, MD  REFERRING PROVIDER: Caro Laroche, DO  REFERRING DIAG: [phone] (ICD-10-CM) - Chronic bilateral low back pain with left-sided sciatica   Rationale for Evaluation and Treatment: Rehabilitation  THERAPY DIAG:  No diagnosis found.  ONSET DATE: March 2024  SUBJECTIVE:                                                                                                                                                                                           SUBJECTIVE STATEMENT: Patient reports continued high levels of pain and states that the pain runs from her lower back to her posterior thigh and then to the medial portion of her distal LE to her foot. She states that  she has numbness and tingling in BIL feet.    PERTINENT HISTORY:  See PMH  PAIN:  Are you having pain?  Yes: NPRS scale: "1000"/10 Pain location: LLE and low back Pain description: ache, radiating Aggravating factors: sitting, standing, walking Relieving factors: lying supine  PRECAUTIONS: None  RED FLAGS: None   WEIGHT BEARING RESTRICTIONS: No  FALLS:  Has patient fallen in last 6 months? No  OCCUPATION: [employer]  PLOF: Independent  PATIENT GOALS: To reduce my symptoms and return to work  NEXT MD VISIT: 11/14/22 Ardyth Harps  OBJECTIVE:   DIAGNOSTIC FINDINGS:   IMPRESSION: 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     Electronically Signed   By: Feliberto Harts M.D.   On: 07/06/2022 16:00    PATIENT SURVEYS:  FOTO 0.5 (28 predicted)  MUSCLE LENGTH: Hamstrings: Right 80 deg; Left 60 deg with low back pain Thomas test: Negative L  POSTURE:  Patient ambulates with a QC R hand,  antalgic gait pattern, unable to maintain upright posture in sitting and leans excessively to L to alleviate  WB on R   PALPATION: Deferred due to body habitus  LUMBAR ROM: deferred due to pain and patient apprehension regarding falling  AROM eval  Flexion   Extension   Right lateral flexion   Left lateral flexion   Right rotation   Left rotation    (Blank rows = not tested)  LOWER EXTREMITY ROM:     A/PROM Right eval Left eval  Hip flexion 100/120d 100 P!/120d  Hip extension    Hip abduction    Hip adduction    Hip internal rotation 0d 0d  Hip external rotation    Knee flexion    Knee extension    Ankle dorsiflexion    Ankle plantarflexion    Ankle inversion    Ankle eversion     (Blank rows = not tested)  LOWER EXTREMITY MMT:    MMT Right eval Left eval  Hip flexion 4 4-  Hip extension 4 4-  Hip abduction 4 4-  Hip adduction    Hip internal rotation    Hip external rotation    Knee flexion    Knee extension    Ankle dorsiflexion    Ankle plantarflexion    Ankle inversion    Ankle eversion     (Blank rows = not tested)  LUMBAR SPECIAL TESTS:  Straight leg raise test: inconclusive for radiating pain and Slump test: Positive on L  FUNCTIONAL TESTS:  5 times sit to stand: UTA due to patient apprehension 30 seconds chair stand test UTA due to patient apprehension  GAIT: Distance walked: 36ft x2 Assistive device utilized: Quad cane small base Level of assistance: Modified independence Comments: antalgic gait pattern Screening for Suicide  Answer the following questions with Yes or No and place an "x" beside the action taken.  1. Over the past two weeks, have you felt down, depressed, or hopeless?  Yes   2. Within the past two weeks, have you felt little interest or pleasure in life?  Yes   If YES to either #1 or #2, then ask #3  3. Have you had thoughts that that life is not worth living or that you might be       better off dead?   Yes   If answer is NO and suspicion is low, then end  4. Over this past week, have you had any thoughts about  hurting or even killing yourself?  Yes   If NO, then end. Patient in no immediate danger   5. If so, do you believe that you intend to or will harm yourself?  Yes      If NO, then end. Patient in no immediate danger   6.  Do you have a plan as to how you would hurt yourself?  No    7.  Over this past week, have you actually done anything to hurt yourself? No    IF YES answers to either #4, #5, #6 or #7, then patient is AT RISK for suicide   Actions Taken  ____  Screening negative; no further action required  __x__  Screening positive; no immediate danger and patient already in treatment with a  mental health provider. Advise patient to speak to their mental health provider.  ___  Screening positive; no immediate danger. Patient advised to contact a mental  health provider for further assessment.   ____  Screening positive; in immediate danger as patient states intention of killing self,  has plan and a sense of imminence. Do not leave alone. Seek permission from  patient to contact a family member to inform them. Direct patient to go to ED.   TREATMENT: [facility] Adult PT Treatment:                                                DATE: 12/12/22 Aquatic therapy at [facility]- [address] - therapeutic pool temp approximately 92 degrees. Pt enters building in wheelchair accompanied by son. Treatment took place in water 3.8 to 4 ft 8 in. deep depending upon activity.  Pt entered and exited the pool via stair and handrails independently with step to pattern. Patient entered water for aquatic therapy for first time and was introduced to principles and therapeutic effects of water as they ambulated and acclimated to pool.  Aquatic Exercise: Holding onto edge of pool, patient leaned over onto Lt side- integrate to water and educating patient on principals of water, pain neuroscience continuation from previous session, and focus on breathing UE support at pool edge: Hip  abduction Hip circles Marching Sitting on steps LAQ Lt (leaning heavily onto Lt hip)  Pt requires the buoyancy of water for active assisted exercises with buoyancy supported for strengthening and AROM exercises. Hydrostatic pressure also supports joints by unweighting joint load by at least 50 % in 3-4 feet depth water. 80% in chest to neck deep water. Water will provide assistance with movement using the current and laminar flow while the buoyancy reduces weight bearing. Pt requires the viscosity of the water for resistance with strengthening exercises.  [facility] Adult PT Treatment:                                                DATE: 11/28/22 Aquatic therapy at [facility]- [address] - therapeutic pool temp approximately 92 degrees. Pt enters building in wheelchair accompanied by son. Treatment took place in water 3.8 to 4 ft 8 in. deep depending upon activity.  Pt entered and exited the pool via stair and handrails independently with step to pattern. Patient entered  water for aquatic therapy for first time and was introduced to principles and therapeutic effects of water as they ambulated and acclimated to pool.  Aquatic Exercise: Holding onto edge of pool, patient leaned over onto Lt side- integrate to water and educating patient on principals of water, pain neuroscience continuation from previous session, and focus on breathing UE support at pool edge: Hip abduction Hip circles Marching Sitting on steps LAQ Lt (leaning heavily onto Lt hip)  Pt requires the buoyancy of water for active assisted exercises with buoyancy supported for strengthening and AROM exercises. Hydrostatic pressure also supports joints by unweighting joint load by at least 50 % in 3-4 feet depth water. 80% in chest to neck deep water. Water will provide assistance with movement using the current and laminar flow while the buoyancy reduces weight bearing. Pt requires the viscosity of the water for resistance with  strengthening exercises.                                                                                                                          PATIENT EDUCATION:  Education details: Eval findings, POC, HEP, self care Person educated: Patient Education method: Explanation Education comprehension: verbalized understanding and needs further education  HOME EXERCISE PROGRAM: Access Code: [email or web address] URL: [email or web address] Date: 10/28/2022 Prepared by: Gustavus Bryant  Exercises - Clamshell  - 2 x daily - 5 x weekly - 2 sets - 10 reps - Supine Bridge  - 2 x daily - 5 x weekly - 2 sets - 10 reps - Seated Table Hamstring Stretch  - 2 x daily - 5 x weekly - 1 sets - 2 reps - 30s hold - Seated Sciatic Tensioner  - 2 x daily - 5 x weekly - 1 sets - 10 reps  ASSESSMENT:  CLINICAL IMPRESSION:  ***  Patient presents to first aquatic PT session reporting continued high levels of baseline pain. Session today focused on integrating principals of the water, weight shifting onto LLE as tolerable, and breathing in the aquatic environment for use of buoyancy to offload joints and the viscosity of water as resistance during therapeutic exercise. She remain very limited by pain and anxiety during session, but is eager to continue with water therapy. Will attempt to a few more aquatic sessions and if progress is not made, will return to land for potential DC.     (EVAL):patient referred to OPP[facility] for continued L radicular pain.  Patient has previously undergone 6 week of Physical Therapy consisting of exercises, stretching, manual techniques and TPDN w/o lasting benefit.  She has undergone 2 ESI w/o lasting relief of symptoms.  Unable to assess 5x STS due to patient apprehension and fear of falling.  She ambulates with a QC R for balance and stability, swithing to L on occasion.  Palpation finds exquisite TTP to L piriformis however shifts to L while seated for pain relief.  FOTO score is 0.5.   SLR and sciatic tension signs inconclusive for neuro  irritation.  Patient easily able to change positions going from supine to prone and tolerating prone on elbows w/o symptom exacerbation.  Patient does not demonstrate a clear and consistent pattern to pain and aggravating/relieving factors.  Progress guarde at this time.  OBJECTIVE IMPAIRMENTS: Abnormal gait, decreased activity tolerance, decreased endurance, decreased knowledge of condition, decreased knowledge of use of DME, decreased mobility, difficulty walking, decreased ROM, decreased strength, impaired perceived functional ability, impaired flexibility, postural dysfunction, obesity, and pain.   ACTIVITY LIMITATIONS: carrying, lifting, bending, sitting, standing, squatting, stairs, and locomotion level  PERSONAL FACTORS: Behavior pattern, Fitness, Past/current experiences, and Time since onset of injury/illness/exacerbation are also affecting patient's functional outcome.   REHAB POTENTIAL: Fair based on unsuccessful conservative treatment to date  CLINICAL DECISION MAKING: Evolving/moderate complexity  EVALUATION COMPLEXITY: Moderate   GOALS:  SHORT TERM GOALS: Target date: 11/11/2022  Patient to demonstrate independence in HEP  Baseline: [id number] Goal status: INITIAL    LONG TERM GOALS: Target date: 11/25/2022    Increase FOTO score to 28 Baseline: 0.5 Goal status: INITIAL  2.  Increase B hip IR to 10d Baseline:  A/PROM Right eval Left eval  Hip flexion 100/120d 100 P!/120d  Hip extension    Hip abduction    Hip adduction    Hip internal rotation 0d 0d   Goal status: INITIAL  3.  Increase L hip strength to 4/5 Baseline:  MMT Right eval Left eval  Hip flexion 4 4-  Hip extension 4 4-  Hip abduction 4 4-   Goal status: INITIAL  4.  Decrease worst pain to 6/10 Baseline: 8/10 Goal status: INITIAL   PLAN:  PT FREQUENCY: 2x/week  PT DURATION: 4 weeks  PLANNED INTERVENTIONS: Therapeutic exercises,  Therapeutic activity, Neuromuscular re-education, Balance training, Gait training, Patient/Family education, Self Care, Joint mobilization, DME instructions, Dry Needling, Electrical stimulation, Spinal mobilization, Cryotherapy, Moist heat, Manual therapy, and Re-evaluation.  PLAN FOR NEXT SESSION: HEP review and update, manual techniques as appropriate, aerobic tasks, ROM and flexibility activities, strengthening and PREs, TPDN, gait and balance training as needed     Hildred Laser, PT 12/12/2022, 1:08 PM

## 2022-12-12 NOTE — Telephone Encounter (Signed)
Attempted to call patient x 2, unable to leave voicemail as mailbox is full.  Attempted to reach out to neurosurgeon to discuss patient case including additional treatment options available. Did not receive message or phone call back. Advised patient to follow-up with neurosurgeon as scheduled to discuss management options. Unfortunately I do not have any additional information I can provide at this time.  Samantha Fortis, DO

## 2022-12-13 NOTE — Assessment & Plan Note (Signed)
Patient has chronic pain syndrome. Neuropathy also contributing. Do not believe opioids would be helpful in this situation. Patient's acute report of bowel and bladder incontinence is concerning and would warrant urgent imaging; however, reassured by patient's normal neuro exam.  - increased cymbalta to 40 mg BID to assist with neuropathy at nighttime.  - Recommended patient go to ED for acute neuro changes, but patient declines, recommended follow up with PCP and given ED precautions

## 2022-12-15 ENCOUNTER — Ambulatory Visit: Payer: BC Managed Care – PPO

## 2022-12-16 ENCOUNTER — Encounter (HOSPITAL_COMMUNITY): Payer: Self-pay

## 2022-12-16 ENCOUNTER — Telehealth (HOSPITAL_COMMUNITY): Payer: BC Managed Care – PPO | Admitting: Psychiatry

## 2022-12-16 NOTE — Telephone Encounter (Signed)
Patient called, LVM and informed that forms are ready for pick up. Copy made and placed in batch scanning. Original placed at front desk for pick up.   Talin Feister C Cecia Egge, RN  

## 2022-12-18 ENCOUNTER — Other Ambulatory Visit: Payer: Self-pay | Admitting: Family Medicine

## 2022-12-18 DIAGNOSIS — F411 Generalized anxiety disorder: Secondary | ICD-10-CM

## 2022-12-19 NOTE — Telephone Encounter (Signed)
PDMP reviewed and appropriate.  Last refilled Lorazepam 1 mg BID PRN on 11/17/2022.

## 2022-12-24 ENCOUNTER — Telehealth (INDEPENDENT_AMBULATORY_CARE_PROVIDER_SITE_OTHER): Payer: BC Managed Care – PPO | Admitting: Psychiatry

## 2022-12-24 ENCOUNTER — Encounter (HOSPITAL_COMMUNITY): Payer: Self-pay | Admitting: Psychiatry

## 2022-12-24 DIAGNOSIS — F333 Major depressive disorder, recurrent, severe with psychotic symptoms: Secondary | ICD-10-CM | POA: Diagnosis not present

## 2022-12-24 DIAGNOSIS — F411 Generalized anxiety disorder: Secondary | ICD-10-CM

## 2022-12-24 MED ORDER — MIRTAZAPINE 45 MG PO TABS
45.0000 mg | ORAL_TABLET | Freq: Every day | ORAL | 3 refills | Status: DC
Start: 2022-12-24 — End: 2023-02-20

## 2022-12-24 MED ORDER — QUETIAPINE FUMARATE 25 MG PO TABS: 50.0000 mg | ORAL_TABLET | Freq: Every day | ORAL | 3 refills | Status: DC

## 2022-12-24 MED ORDER — HYDROXYZINE HCL 50 MG PO TABS: 50.0000 mg | ORAL_TABLET | Freq: Three times a day (TID) | ORAL | 3 refills | Status: DC | PRN

## 2022-12-24 NOTE — Progress Notes (Signed)
BH MD/PA/NP OP Progress Note Virtual Visit via Video Note  I connected with [name] on 12/24/22 at  8:00 AM EDT by a video enabled telemedicine application and verified that I am speaking with the correct person using two identifiers.  Location: Patient:work Provider: Clinic   I discussed the limitations of evaluation and management by telemedicine and the availability of in person appointments. The patient expressed understanding and agreed to proceed.  I provided 30 minutes of non-face-to-face time during this encounter.       12/24/2022 8:28 AM [name]  MRN:  [id number]  Chief Complaint: "When the sun rises I am sad"  HPI: 52 year old female seen today for follow up psychiatric evaluation. She has a psychiatric history of  OCD, adjustment disorder, anxiety, depression, SI, and bipolar disorder. She is currently being managed on  Cymbalta 40 daily (prescribed by PCP), Vistaril 50 mg three times daily, and mirtazapine 45 mg at bedtime.  She informed Clinical research associate that her medications are somewhat effective in managing her psychiatric conditions.  Today she was well-groomed, pleasant, cooperative, and engaged in conversation.She informed Clinical research associate that when the sun rises she is sad.  Patient informed Clinical research associate that she believes that she would be better off dead at times.  She however notes that she does not wish to end her life and does not have a plan to harm herself.  Patient informed writer that her pain exacerbates her feelings.  She continues to have sciatic pain which she notes that she feels in her back, legs, and toes.  She informed Clinical research associate that she was given epidural injection, a cortisone injection, and Cymbalta.  She notes that her cortisone injection was effective but only lasted for 4 days prior to the pain coming back.  Today she quantifies her pain as 10 out of 10.  She was taking aquatic therapy but notes that it was too painful to be transported to the pool.  Patient  notes that the above exacerbates her anxiety depression.  She notes that she has been unable to walk for 8 months.  She also notes that her appetite has been poor.  Patient is on Ozempic and notes that within the last year she has lost 90 pounds.  Patient informed writer that in the last 3 months she has lost 20 pounds.  Today provider conducted a GAD-7 the patient scored a 17, at her last visit he scored a 20.  Provider also conducted PHQ-9 the patient scored a 25, at her last visit she scored a 25.  She notes that her sleep has been poor due to hot flashes and intense pain.  Patient informed writer that she sleeps 4 hours nightly.  Recently she reports that she has been having hallucinations of bugs crawling on her and auditory hallucinations.  She notes that she hears things downstairs that are not actually there.  She denies paranoia or mania.   Today patient agreeable to starting Seroquel 50 mg to help manage sleep, anxiety, depression, and symptoms of psychosis.  She will continue all other medications as prescribed.  Provider discussed the risk and benefits of being on 2 antidepressants.  She endorsed understanding.  Provider asked patient if she would be willing to return for therapy.  She however notes that she was not as she fears that therapist would spread her visits this.  No other concerns at this time.    Visit Diagnosis:    ICD-10-CM   1. Severe recurrent major depressive disorder with  psychotic features (HCC)  F33.3 QUEtiapine (SEROQUEL) 25 MG tablet    mirtazapine (REMERON) 45 MG tablet    2. Generalized anxiety disorder  F41.1 QUEtiapine (SEROQUEL) 25 MG tablet    hydrOXYzine (ATARAX) 50 MG tablet    mirtazapine (REMERON) 45 MG tablet         Past Psychiatric History: OCD, adjustment disorder, anxiety, depression, SI, and bipolar disorder.   Past Medical History:  Past Medical History:  Diagnosis Date   Acute renal failure (HCC) 05/27/10   hemodialysis for 6 weeks   Anxiety     Asthma    Depression    Diabetes mellitus    Hypertension    Rhabdomyolysis 06/06/10   after drug overdose   Suicide attempt by drug ingestion (HCC) 06/05/10   result rhabdomyolosis and ARF requrining dialysis     Past Surgical History:  Procedure Laterality Date   ANKLE ARTHROSCOPY Right 08/17/2015   Procedure: RIGHT ANKLE ARTHROSCOPY WITH SYNOVECTOMY AND LOOSE BODY EXCISION;  Surgeon: Marcene Corning, MD;  Location: [facility] OR;  Service: Orthopedics;  Laterality: Right;   CHOLECYSTECTOMY     TUBAL LIGATION  1998    Family Psychiatric History: Unknown  Family History:  Family History  Problem Relation Age of Onset   Asthma Father     Social History:  Social History   Socioeconomic History   Marital status: Married    Spouse name: Not on file   Number of children: 2   Years of education: Not on file   Highest education level: Not on file  Occupational History   Occupation: Research officer, trade union  Tobacco Use   Smoking status: Never    Passive exposure: Never   Smokeless tobacco: Never  Vaping Use   Vaping status: Never Used  Substance and Sexual Activity   Alcohol use: No   Drug use: No   Sexual activity: Yes    Comment: with husband   Other Topics Concern   Not on file  Social History Narrative   Married high school boyfriend at age 50, two boys, still married but he has been living with other women for years.   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         Social Determinants of Corporate investment banker Strain: Not on file  Food Insecurity: No Food Insecurity (07/08/2022)   Hunger Vital Sign    Worried About Programme researcher, broadcasting/film/video in the Last Year: Never true    Ran Out of Food in the Last Year: Never true  Recent Concern: Food Insecurity - Food Insecurity Present (07/08/2022)   Hunger Vital Sign    Worried About Programme researcher, broadcasting/film/video in the Last Year: Sometimes true    Ran Out of Food in the Last Year: Sometimes  true  Transportation Needs: No Transportation Needs (07/08/2022)   PRAPARE - Administrator, Civil Service (Medical): No    Lack of Transportation (Non-Medical): No  Physical Activity: Not on file  Stress: Not on file  Social Connections: Not on file    Allergies:  Allergies  Allergen Reactions   Ace Inhibitors Other (See Comments)    Patient had acute renal failure requiring hemodialysis after a suicide attempt.  Nephro recommended avoiding use.   Haldol [Haloperidol Lactate] Other (See Comments)    Tardive dyskinesia   Nsaids Other (See Comments)    Nephro recommended avoiding after acute renal failure requiring hemodialysis associated  with a suicide attempt.   Entresto [Sacubitril-Valsartan] Itching   Latex Other (See Comments)    Rash around IV site    Metabolic Disorder Labs: Lab Results  Component Value Date   HGBA1C 8.5 (A) 10/17/2022   MPG 174.29 07/09/2022   MPG 243.17 11/14/2017   No results found for: "PROLACTIN" Lab Results  Component Value Date   CHOL 201 (H) 04/15/2021   TRIG 231 (H) 04/15/2021   HDL 49 04/15/2021   CHOLHDL 4.1 04/15/2021   VLDL 46 (H) 04/10/2015   LDLCALC 112 (H) 04/15/2021   LDLCALC 125 (H) 12/19/2020   Lab Results  Component Value Date   TSH 1.015 02/20/2022   TSH 2.689 05/19/2021    Therapeutic Level Labs: No results found for: "LITHIUM" No results found for: "VALPROATE" No results found for: "CBMZ"  Current Medications: Current Outpatient Medications  Medication Sig Dispense Refill   QUEtiapine (SEROQUEL) 25 MG tablet Take 2 tablets (50 mg total) by mouth at bedtime. 60 tablet 3   albuterol (VENTOLIN HFA) 108 (90 Base) MCG/ACT inhaler Inhale 2 puffs into the lungs every 6 (six) hours as needed for shortness of breath. 18 g 5   blood glucose meter kit and supplies KIT Dispense based on patient and insurance preference. Use up to four times daily as directed. 1 each 0   DULoxetine 40 MG CPEP Take 1 capsule (40 mg  total) by mouth 2 (two) times daily. 180 capsule 1   fluticasone (FLONASE) 50 MCG/ACT nasal spray Place 1 spray into both nostrils daily. 1 spray in each nostril every day 16 g 1   fluticasone-salmeterol (WIXELA INHUB) 250-50 MCG/ACT AEPB Inhale 1 puff into the lungs in the morning and at bedtime. 60 each 1   furosemide (LASIX) 40 MG tablet Take 1 tablet (40 mg total) by mouth daily. 30 tablet 0   hydrOXYzine (ATARAX) 50 MG tablet Take 1 tablet (50 mg total) by mouth 3 (three) times daily as needed for anxiety. 90 tablet 3   insulin glargine (LANTUS) 100 UNIT/ML Solostar Pen Inject 65 Units into the skin 2 (two) times daily. 15 mL 2   Insulin Pen Needle 32G X 4 MM MISC 1 each by Does not apply route 3 (three) times daily. 300 each 2   isosorbide-hydrALAZINE (BIDIL) 20-37.5 MG tablet Take 1 tablet by mouth 3 (three) times daily. 90 tablet 0   levothyroxine (SYNTHROID) 300 MCG tablet Take 1 tablet (300 mcg total) by mouth daily at 6 (six) AM. 90 tablet 2   LORazepam (ATIVAN) 1 MG tablet Take 1 tablet by mouth twice daily as needed for anxiety 45 tablet 0   losartan (COZAAR) 25 MG tablet Take 1 tablet (25 mg total) by mouth at bedtime. 30 tablet 1   metFORMIN (GLUCOPHAGE-XR) 500 MG 24 hr tablet Take 2 tablets (1,000 mg total) by mouth 2 (two) times daily with a meal. 360 tablet 1   methocarbamol (ROBAXIN) 500 MG tablet Take 2 tablets (1,000 mg total) by mouth 2 (two) times daily. 120 tablet 1   metoprolol succinate (TOPROL-XL) 50 MG 24 hr tablet Take 1 tablet (50 mg total) by mouth at bedtime. Take with or immediately following a meal. 90 tablet 3   mirtazapine (REMERON) 45 MG tablet Take 1 tablet (45 mg total) by mouth at bedtime. 30 tablet 3   NOVOLOG FLEXPEN 100 UNIT/ML FlexPen INJECT 15 UNITS SUBCUTANEOUSLY WITH LUNCH AND INJECT 20 UNITS SUBCUTANEOUSLY WITH SUPPER 15 mL 2   oxyCODONE-acetaminophen (PERCOCET) 5-325  MG tablet Take 1 tablet by mouth every 12 (twelve) hours as needed for severe pain. 30  tablet 0   rosuvastatin (CRESTOR) 20 MG tablet Take 1 tablet (20 mg total) by mouth daily. 30 tablet 0   Semaglutide, 2 MG/DOSE, (OZEMPIC, 2 MG/DOSE,) 8 MG/3ML SOPN Inject 2 mg into the skin once per week. 3 mL 2   No current facility-administered medications for this visit.     Musculoskeletal: Strength & Muscle Tone: within normal limits, telehealth visit Gait & Station: normal, telehealth visit Patient leans: N/A  Psychiatric Specialty Exam: Review of Systems  There were no vitals taken for this visit.There is no height or weight on file to calculate BMI.  General Appearance: Well Groomed  Eye Contact:  Good  Speech:  Clear and Coherent and Normal Rate  Volume:  Normal  Mood:  Anxious and Depressed   Affect:  Appropriate and Congruent  Thought Process:  Coherent, Goal Directed and Linear  Orientation:  Full (Time, Place, and Person)  Thought Content: Logical and Hallucinations: Auditory Tactile   Suicidal Thoughts:  Yes.  without intent/plan  Homicidal Thoughts:  No  Memory:  Immediate;   Good Recent;   Good Remote;   Good  Judgement:  Good  Insight:  Good  Psychomotor Activity:  Normal  Concentration:  Concentration: Good and Attention Span: Good  Recall:  Good  Fund of Knowledge: Good  Language: Good  Akathisia:  No  Handed:  Right  AIMS (if indicated): Not done  Assets:  Communication Skills Desire for Improvement Financial Resources/Insurance Housing Social Support  ADL's:  Intact  Cognition: WNL  Sleep:  Poor   Screenings: AIMS    Flowsheet Row Admission (Discharged) from 02/24/2022 in [facility]PATIENT BEH[facility]AL MEDICINE  AIMS Total Score 0      AUDIT    Flowsheet Row Admission (Discharged) from 02/24/2022 in [facility] INPATIENT BE[facility]AVIORAL MEDICINE  Alcohol Use Disorder Identification Test Final Score (AUDIT) 0      GAD-7    Flowsheet Row Video Visit from 12/24/2022 in [facility] Video Visit from 10/21/2022 in [facility] Video Visit from 05/21/2022 in [facility] Video Visit from 03/26/2022 in [facility] Counselor from 03/20/2022 in [facility]  Total GAD-7 Score 17 20 12 14 7       PHQ2-9    Flowsheet Row Video Visit from 12/24/2022 in [facility] Office Visit from 12/11/2022 in [facility] Office Visit from 11/14/2022 in [facility] Video Visit from 10/21/2022 in [facility] Office Visit from 10/17/2022 in [facility]  PHQ-2 Total Score 6 6 6 6 6   PHQ-9 Total Score 25 24 24 25 24       Flowsheet Row Video Visit from 12/24/2022 in [facility] Most recent reading at 12/24/2022  8:17 AM ED from 10/21/2022 in [facility] Emergency Department at [facility] Most recent reading at 10/21/2022 10:06 PM Video Visit from 10/21/2022 in [facility] Most recent reading at 10/21/2022 11:26 AM  C-SSRS RISK CATEGORY Error: Q7 should not be populated when Q6 is No No Risk Error: Q7 should not be populated when Q6 is No        Assessment and Plan: Patient endorses symptoms of anxiety, depression, tactile and visual hallucinations.Today patient agreeable to starting Seroquel 50 mg to help  manage sleep, anxiety, depression, and symptoms of psychosis.  She will continue all other medications as prescribed.  Provider discussed the risk and benefits of being on 2 antidepressants.  She endorsed understanding.  Provider asked patient if she would be willing to return for therapy.  She however notes that she was not as she fears that therapist would spread her visits this.    1. Generalized anxiety disorder  Start- QUEtiapine (SEROQUEL) 25 MG tablet; Take 2 tablets (50 mg total) by mouth at bedtime.  Dispense: 60 tablet; Refill:  3 Continue- hydrOXYzine (ATARAX) 50 MG tablet; Take 1 tablet (50 mg total) by mouth 3 (three) times daily as needed for anxiety.  Dispense: 90 tablet; Refill: 3 Continue- mirtazapine (REMERON) 45 MG tablet; Take 1 tablet (45 mg total) by mouth at bedtime.  Dispense: 30 tablet; Refill: 3  2. Severe recurrent major depressive disorder with psychotic features (HCC)  Start- QUEtiapine (SEROQUEL) 25 MG tablet; Take 2 tablets (50 mg total) by mouth at bedtime.  Dispense: 60 tablet; Refill: 3 Continue- mirtazapine (REMERON) 45 MG tablet; Take 1 tablet (45 mg total) by mouth at bedtime.  Dispense: 30 tablet; Refill: 3   Follow-up in 2 months  Shanna Cisco, NP 12/24/2022, 8:28 AM

## 2022-12-29 ENCOUNTER — Other Ambulatory Visit: Payer: Self-pay | Admitting: Family Medicine

## 2022-12-29 ENCOUNTER — Encounter: Payer: Self-pay | Admitting: Student

## 2022-12-29 ENCOUNTER — Ambulatory Visit: Payer: BC Managed Care – PPO | Admitting: Student

## 2022-12-29 ENCOUNTER — Telehealth: Payer: Self-pay | Admitting: Student

## 2022-12-29 VITALS — BP 109/78 | HR 116 | Ht 71.0 in | Wt 318.0 lb

## 2022-12-29 DIAGNOSIS — M5442 Lumbago with sciatica, left side: Secondary | ICD-10-CM

## 2022-12-29 DIAGNOSIS — M5430 Sciatica, unspecified side: Secondary | ICD-10-CM

## 2022-12-29 DIAGNOSIS — G8929 Other chronic pain: Secondary | ICD-10-CM | POA: Diagnosis not present

## 2022-12-29 NOTE — Patient Instructions (Signed)
Am sorry that you are in so much pain.  Unfortunately our office is maxed out what we are going to be able to do as far as medications.  I recommend that she follow-up with pain management and neurosurgery.  I have sent in an order for a wheelchair.  This will be routed to our nursing team to be run through your insurance.  You should hear back within the next week or 2 if you have been approved.

## 2022-12-29 NOTE — Progress Notes (Signed)
    SUBJECTIVE:   CHIEF COMPLAINT / HPI:   [name] is a 52 y.o. female  presenting for chronic pain.  She has had a longstanding battle with sciatica on the left side.  She is been appropriately prescribed medications such as gabapentin/Lyrica which have not helped.  She is currently on Cymbalta 40 mg twice daily and Robaxin.  She is in distress because these medications have not improved her pain and she only continues to worsen.  She reports reduced quality of life from her pain.  She is seeing pain management in the past but was not satisfied with her interaction with the provider at that office.  She follows with behavioral health and most recently saw therapist on the 16th who prescribed her on Seroquel for auditory and visual hallucinations which should patient attributes to severe pain.  PERTINENT  PMH / PSH: Reviewed and updated   OBJECTIVE:   BP 109/78   Pulse (!) 116   Ht 5\' 11"  (1.803 m)   Wt (!) 318 lb (144.2 kg)   LMP 11/23/2022   SpO2 100%   BMI 44.35 kg/m   Chronically ill-appearing, no acute distress Cardio: RRR Pulm: Clear, no wheezing, no crackles. No increased work of breathing Psych:  Cognition and judgment appear intact. Alert, communicative  and cooperative with normal attention span and concentration. No apparent delusions, illusions, hallucinations      12/29/2022    2:48 PM 12/24/2022    8:17 AM 12/11/2022    1:41 PM  PHQ9 SCORE ONLY  PHQ-9 Total Score 24 25 24       ASSESSMENT/PLAN:   Chronic low back pain with left-sided sciatica Discussed with the patient that we have reached our maximum level of care at the family medicine clinic.  She understands that we would not be providing chronic or long-term opioids at this office.  Encouraged her to seek out pain management again and to continue following with her therapist.  She has an appointment with neurosurgery in November.  Encourage patient to keep this appointment to explore other options for  pain control.  Patient requested DME order for wheelchair since she is struggling with walking secondary to her pain.  Order placed for wheelchair needed for transfers, walking, assistance getting around the house.     Glendale Chard, DO [facility]

## 2022-12-29 NOTE — Telephone Encounter (Signed)
DME order for wheelchair submitted. Patient requesting due to severe pain that limits her ability to shower, transfer, walk.   Glendale Chard, DO [facility], PGY-2 12/29/22 3:09 PM

## 2022-12-29 NOTE — Telephone Encounter (Signed)
Community message sent to [employer]. Will await response.   Jozlyn Schatz C January Bergthold, RN  

## 2022-12-29 NOTE — Assessment & Plan Note (Addendum)
Discussed with the patient that we have reached our maximum level of care at the family medicine clinic.  She understands that we would not be providing chronic or long-term opioids at this office.  Encouraged her to seek out pain management again and to continue following with her therapist.  She has an appointment with neurosurgery in November.  Encourage patient to keep this appointment to explore other options for pain control.  Patient requested DME order for wheelchair since she is struggling with walking secondary to her pain.  Order placed for wheelchair needed for transfers, walking, assistance getting around the house.

## 2022-12-30 NOTE — Telephone Encounter (Signed)
Receipt confirmed by [employer].   Beyounce Dickens C Christopherjame Carnell, RN  

## 2023-01-07 ENCOUNTER — Other Ambulatory Visit: Payer: Self-pay

## 2023-01-07 DIAGNOSIS — I5042 Chronic combined systolic (congestive) and diastolic (congestive) heart failure: Secondary | ICD-10-CM

## 2023-01-08 MED ORDER — FUROSEMIDE 40 MG PO TABS
40.0000 mg | ORAL_TABLET | Freq: Every day | ORAL | 0 refills | Status: DC
Start: 2023-01-08 — End: 2023-02-02

## 2023-01-09 ENCOUNTER — Telehealth: Payer: Self-pay | Admitting: Family Medicine

## 2023-01-09 NOTE — Telephone Encounter (Signed)
Son of patient dropped off form at front desk for Short-term Disability.  Verified that patient section of form has been completed.  Last DOS/WCC with PCP was 12/11/22.  Placed form in red team folder to be completed by clinical staff.  Vilinda Blanks

## 2023-01-09 NOTE — Telephone Encounter (Signed)
Placed in MDs box to be filled out. Mylan Schwarz, CMA  

## 2023-01-12 NOTE — Telephone Encounter (Signed)
Patient called, LVM and informed that forms are ready for pick up. Copy made and placed in batch scanning. Original placed at front desk for pick up.   Talin Feister C Cecia Egge, RN  

## 2023-01-16 ENCOUNTER — Other Ambulatory Visit: Payer: Self-pay | Admitting: Family Medicine

## 2023-01-16 DIAGNOSIS — F411 Generalized anxiety disorder: Secondary | ICD-10-CM

## 2023-01-20 ENCOUNTER — Telehealth: Payer: Self-pay

## 2023-01-20 NOTE — Telephone Encounter (Signed)
Patient LVM on nurse line regarding return to work for [employer] paperwork.   Patient reports that her last day that has been approved is 02/09/23.  She states that she is in the same situation with no improvements.   She is asking to extend leave date.   Attempted to call patient back to determine if we needed to fill out additional paperwork or what exactly is needed from our office.   She did not answer, LVM asking that she return call to office.   Veronda Prude, RN

## 2023-01-22 ENCOUNTER — Telehealth: Payer: BC Managed Care – PPO | Admitting: Family Medicine

## 2023-01-22 DIAGNOSIS — M5442 Lumbago with sciatica, left side: Secondary | ICD-10-CM | POA: Diagnosis not present

## 2023-01-22 DIAGNOSIS — G8929 Other chronic pain: Secondary | ICD-10-CM

## 2023-01-22 DIAGNOSIS — R7982 Elevated C-reactive protein (CRP): Secondary | ICD-10-CM

## 2023-01-22 DIAGNOSIS — R531 Weakness: Secondary | ICD-10-CM

## 2023-01-22 NOTE — Telephone Encounter (Signed)
Patients requests update on manual wheelchair order during virtual visit with PCP this afternoon.   Community message sent to [employer] checking the status of request.   Will update patient as I learn more.

## 2023-01-22 NOTE — Telephone Encounter (Signed)
[employer] reports they left patient multiple messages in regards to wheel chair order.  [employer] asks I have patient reach out to them to discuss using phone number [phone].  I called patient to give information above. She reports they just called her. She reports unfortunately they are charging a 50 dollar down payment and 13 dollars a month to rent the wheelchair.   She reports she can not afford this at this time.   She will reach back out if situation changes.

## 2023-01-22 NOTE — Assessment & Plan Note (Signed)
Ongoing, worsening symptoms.  Unfortunately unable to see neurosurgery note in EHR, per patient they want a rule out autoimmune process given lack of relief with prior epidural injections. Presentation less consistent with autoimmune process but possible multiple comorbidities causing symptoms. Patient pain significantly uncontrolled, I do feel she needs further evaluation by pain management for better control. -Autoimmune blood work, patient scheduled for lab visit tomorrow -MRI brain with and without to assess for MS -Referral to pain management -Follow-up with orthopedics as recommended by neurosurgery for additional evaluation -Send work note via MyChart to extend patient leave

## 2023-01-22 NOTE — Progress Notes (Signed)
Virtual Visit via Video Note  I connected with [name] on 01/22/23 at  2:30 PM EST by a video enabled telemedicine application and verified that I am speaking with the correct person using two identifiers.  Location: Patient: [name], home Provider: Elberta Fortis, [facility]   I discussed the limitations of evaluation and management by telemedicine and the availability of in person appointments. The patient expressed understanding and agreed to proceed.  History of Present Illness: Low back pain with left-sided sciatica Ongoing, uncontrolled.  Patient reports worsening pain, it is too debilitating to attend appointment in person therefore changed to virtual visit.  Unable to stand more than a minute and significant difficulty walking.  States she is shooting pain from her foot up to her back.  States her foot has also been swollen.  Seen by neurosurgery on 11/7, unfortunately note is not visible.  She reports they wanted additional testing for multiple sclerosis and lupus.  They referred her to a more urgent Ortho for specific testing she cannot remember the name of.  She has appoint with them on 11/25.  Reports family history of SLE in mother and MS in sister.  Tearful throughout visit, reports frustration that she is continuing to have worsening pain.  Needs renewed paperwork with job to extend leave time.   Observations/Objective: Tearful throughout exam Normal work of breathing on room air  Assessment and Plan: Assessment & Plan Chronic left-sided low back pain with left-sided sciatica Ongoing, worsening symptoms.  Unfortunately unable to see neurosurgery note in EHR, per patient they want a rule out autoimmune process given lack of relief with prior epidural injections. Presentation less consistent with autoimmune process but possible multiple comorbidities causing symptoms. Patient pain significantly uncontrolled, I do feel she needs further evaluation  by pain management for better control. -Autoimmune blood work, patient scheduled for lab visit tomorrow -MRI brain with and without to assess for MS -Referral to pain management -Follow-up with orthopedics as recommended by neurosurgery for additional evaluation -Send work note via MyChart to extend patient leave    Follow Up Instructions: Lab visit scheduled for tomorrow, will obtain blood work for [facility] to schedule MRI, will call patient with additional information Referral placed for new pain management doctor.  Referral coordinator to contact patient with information    Elberta Fortis, MD

## 2023-01-23 ENCOUNTER — Other Ambulatory Visit: Payer: Self-pay

## 2023-01-26 ENCOUNTER — Other Ambulatory Visit: Payer: BC Managed Care – PPO

## 2023-01-26 DIAGNOSIS — R531 Weakness: Secondary | ICD-10-CM

## 2023-01-26 DIAGNOSIS — R7982 Elevated C-reactive protein (CRP): Secondary | ICD-10-CM

## 2023-01-30 ENCOUNTER — Other Ambulatory Visit: Payer: Self-pay | Admitting: Family Medicine

## 2023-01-30 ENCOUNTER — Telehealth: Payer: Self-pay

## 2023-01-30 DIAGNOSIS — E1122 Type 2 diabetes mellitus with diabetic chronic kidney disease: Secondary | ICD-10-CM

## 2023-01-30 NOTE — Telephone Encounter (Signed)
Patient calls nurse line to follow up on referral to pain management. She states that she does not want to go to the [facility] management, as she had a bad experience there in the past.   Will forward to referral coordinator for update.   Veronda Prude, RN

## 2023-02-01 ENCOUNTER — Other Ambulatory Visit: Payer: Self-pay | Admitting: Family Medicine

## 2023-02-01 DIAGNOSIS — M5430 Sciatica, unspecified side: Secondary | ICD-10-CM

## 2023-02-01 DIAGNOSIS — I5042 Chronic combined systolic (congestive) and diastolic (congestive) heart failure: Secondary | ICD-10-CM

## 2023-02-04 LAB — ANA+ENA+DNA/DS+ANTICH+CENTR
ANA Titer 1: NEGATIVE
Anti JO-1: <0.2 AI (ref 0.0–0.9)
Centromere Ab Screen: <0.2 AI (ref 0.0–0.9)
Chromatin Ab SerPl-aCnc: <0.2 AI (ref 0.0–0.9)
ENA RNP Ab: 0.2 AI (ref 0.0–0.9)
ENA SM Ab Ser-aCnc: <0.2 AI (ref 0.0–0.9)
ENA SSA (RO) Ab: <0.2 AI (ref 0.0–0.9)
ENA SSB (LA) Ab: <0.2 AI (ref 0.0–0.9)
Scleroderma (Scl-70) (ENA) Antibody, IgG: <0.2 AI (ref 0.0–0.9)
dsDNA Ab: <1 [IU]/mL (ref 0–9)

## 2023-02-04 LAB — RHEUMATOID FACTOR: Rheumatoid fact SerPl-aCnc: <10 [IU]/mL (ref <14.0)

## 2023-02-04 LAB — ANTI-CCP AB, IGG + IGA (RDL): Anti-CCP Ab, IgG + IgA (RDL): <20 U (ref <20)

## 2023-02-10 ENCOUNTER — Encounter: Payer: Self-pay | Admitting: Family Medicine

## 2023-02-12 ENCOUNTER — Telehealth: Payer: Self-pay | Admitting: Family Medicine

## 2023-02-12 NOTE — Telephone Encounter (Signed)
Patient's son dropped off short term disability form to be completed. Last DOS was 01/22/23. Placed in [employer].

## 2023-02-13 NOTE — Telephone Encounter (Signed)
Form has been placed in your box to be completed, please finish when you have time. Thanks! Penni Bombard CMA

## 2023-02-16 ENCOUNTER — Ambulatory Visit
Admission: RE | Admit: 2023-02-16 | Discharge: 2023-02-16 | Disposition: A | Discharge status: Home / Self Care | Payer: BC Managed Care – PPO | Source: Ambulatory Visit | Attending: Family Medicine

## 2023-02-16 DIAGNOSIS — R7982 Elevated C-reactive protein (CRP): Secondary | ICD-10-CM

## 2023-02-16 DIAGNOSIS — R531 Weakness: Secondary | ICD-10-CM

## 2023-02-16 MED ORDER — GADOPICLENOL 0.5 MMOL/ML IV SOLN
10.0000 mL | Freq: Once | INTRAVENOUS | Status: AC | PRN
  Administered 2023-02-16: 10 mL via INTRAVENOUS

## 2023-02-16 NOTE — Telephone Encounter (Signed)
Patient calls nurse line in regards to paperwork.   She reports she spoke with her case representative and reports PCP must note return to work date of 06/09/2023.  She reports this paperwork must match the last letter written by PCP on 01/22/2023 regarding this.   Will forward to PCP.

## 2023-02-17 ENCOUNTER — Telehealth: Payer: Self-pay

## 2023-02-17 NOTE — Telephone Encounter (Signed)
Patient called, LVM and informed that forms are ready for pick up. Copy made and placed in batch scanning. Original placed at front desk for pick up.   Talin Feister C Cecia Egge, RN  

## 2023-02-17 NOTE — Telephone Encounter (Signed)
Received call from [facility] regarding call report on brain MRI.   They needed provider to be made aware regarding the following addendum.   ADDENDUM: THERE IS A DICTATION ERROR WITHIN IMPRESSION #1, WHICH SHOULD READ: 4 mm lesion within the left aspect of the pituitary gland, which may reflect a cyst or a pituitary microadenoma. No further imaging evaluation or imaging follow-up is necessary. Correlate for a history of pituitary hypersecretion.  Will forward message to PCP.   Veronda Prude, RN

## 2023-02-20 ENCOUNTER — Encounter (HOSPITAL_COMMUNITY): Payer: Self-pay | Admitting: Psychiatry

## 2023-02-20 ENCOUNTER — Other Ambulatory Visit: Payer: Self-pay | Admitting: Family Medicine

## 2023-02-20 ENCOUNTER — Telehealth (INDEPENDENT_AMBULATORY_CARE_PROVIDER_SITE_OTHER): Payer: BC Managed Care – PPO | Admitting: Psychiatry

## 2023-02-20 DIAGNOSIS — I5042 Chronic combined systolic (congestive) and diastolic (congestive) heart failure: Secondary | ICD-10-CM

## 2023-02-20 DIAGNOSIS — F411 Generalized anxiety disorder: Secondary | ICD-10-CM | POA: Diagnosis not present

## 2023-02-20 DIAGNOSIS — F333 Major depressive disorder, recurrent, severe with psychotic symptoms: Secondary | ICD-10-CM

## 2023-02-20 DIAGNOSIS — E118 Type 2 diabetes mellitus with unspecified complications: Secondary | ICD-10-CM

## 2023-02-20 MED ORDER — MIRTAZAPINE 45 MG PO TABS: 45.0000 mg | ORAL_TABLET | Freq: Every day | ORAL | 3 refills | Status: DC

## 2023-02-20 MED ORDER — HYDROXYZINE HCL 50 MG PO TABS: 50.0000 mg | ORAL_TABLET | Freq: Three times a day (TID) | ORAL | 3 refills | Status: DC | PRN

## 2023-02-20 MED ORDER — QUETIAPINE FUMARATE 25 MG PO TABS: 50.0000 mg | ORAL_TABLET | Freq: Every day | ORAL | 3 refills | Status: DC

## 2023-02-20 NOTE — Progress Notes (Signed)
BH MD/PA/NP OP Progress Note Virtual Visit via Video Note  I connected with [name] on 02/20/23 at 10:00 AM EST by a video enabled telemedicine application and verified that I am speaking with the correct person using two identifiers.  Location: Patient:work Provider: Clinic   I discussed the limitations of evaluation and management by telemedicine and the availability of in person appointments. The patient expressed understanding and agreed to proceed.  I provided 30 minutes of non-face-to-face time during this encounter.       02/20/2023 8:59 AM [name]  MRN:  [id number]  Chief Complaint: "The Seroquel works"  HPI: 52 year old female seen today for follow up psychiatric evaluation. She has a psychiatric history of  OCD, adjustment disorder, anxiety, depression, SI, and bipolar disorder. She is currently being managed on  Cymbalta 40 daily (prescribed by PCP), Seroquel 50 mg nightly, Vistaril 50 mg three times daily, and mirtazapine 45 mg at bedtime.  She informed Clinical research associate that her medications are effective in managing her psychiatric conditions.  Today she was well-groomed, pleasant, cooperative, and engaged in conversation. She informed Clinical research associate that the Seroquel works well. She notes that she now sleeps 6 hours nightly. She does note that the pain in her back is still problematic and quantifies her pain as a 10 out of 10.  Recently she was seen by a neurosurgeon who informed her that if she loses 15 more pound he will do surgery on her to correct her severe sciatica.  Patient informed Clinical research associate that she has already lost over 100 pounds.   Since her last visit she informed writer that her anxiety and depression has drastically improved.  She informed Clinical research associate that she uses a bag of tools to help cope with stressors when they arrive.  Patient notes that she has an adult coloring book, silly putty, and a book with inspirational quotes that it.  Recently she informed writer that she  used it on her deceased son's birthday.  He has been gone for 8 years. Today provider conducted a GAD-7 and patient scored an 8, at her last visit she scored a 17.  Provider also conducted PHQ-9 and patient scored a 7, at her last visit she scored a 25.  Today she endorses passive SI but denies wanting to harm himself.  She denies SI/HI/AVH, mania, paranoia.  Overall patient notes she is doing well.  No medication changes made today.  Patient improved taking medication as prescribed.  Sh  No other concerns at this time.    Visit Diagnosis:    ICD-10-CM   1. Generalized anxiety disorder  F41.1 hydrOXYzine (ATARAX) 50 MG tablet    QUEtiapine (SEROQUEL) 25 MG tablet    mirtazapine (REMERON) 45 MG tablet    2. Severe recurrent major depressive disorder with psychotic features (HCC)  F33.3 QUEtiapine (SEROQUEL) 25 MG tablet    mirtazapine (REMERON) 45 MG tablet          Past Psychiatric History: OCD, adjustment disorder, anxiety, depression, SI, and bipolar disorder.   Past Medical History:  Past Medical History:  Diagnosis Date   Acute renal failure (HCC) 05/27/10   hemodialysis for 6 weeks   Anxiety    Asthma    Depression    Diabetes mellitus    Hypertension    Rhabdomyolysis 06/06/10   after drug overdose   Suicide attempt by drug ingestion (HCC) 06/05/10   result rhabdomyolosis and ARF requrining dialysis     Past Surgical History:  Procedure Laterality  Date   ANKLE ARTHROSCOPY Right 08/17/2015   Procedure: RIGHT ANKLE ARTHROSCOPY WITH SYNOVECTOMY AND LOOSE BODY EXCISION;  Surgeon: Marcene Corning, MD;  Location: [facility] OR;  Service: Orthopedics;  Laterality: Right;   CHOLECYSTECTOMY     TUBAL LIGATION  1998    Family Psychiatric History: Unknown  Family History:  Family History  Problem Relation Age of Onset   Asthma Father     Social History:  Social History   Socioeconomic History   Marital status: Married    Spouse name: Not on file   Number of children: 2   Years  of education: Not on file   Highest education level: Not on file  Occupational History   Occupation: Research officer, trade union  Tobacco Use   Smoking status: Never    Passive exposure: Never   Smokeless tobacco: Never  Vaping Use   Vaping status: Never Used  Substance and Sexual Activity   Alcohol use: No   Drug use: No   Sexual activity: Yes    Comment: with husband   Other Topics Concern   Not on file  Social History Narrative   Married high school boyfriend at age 47, two boys, still married but he has been living with other women for years.   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         Social Drivers of Corporate investment banker Strain: Not on file  Food Insecurity: No Food Insecurity (07/08/2022)   Hunger Vital Sign    Worried About Programme researcher, broadcasting/film/video in the Last Year: Never true    Ran Out of Food in the Last Year: Never true  Recent Concern: Food Insecurity - Food Insecurity Present (07/08/2022)   Hunger Vital Sign    Worried About Programme researcher, broadcasting/film/video in the Last Year: Sometimes true    Ran Out of Food in the Last Year: Sometimes true  Transportation Needs: No Transportation Needs (07/08/2022)   PRAPARE - Administrator, Civil Service (Medical): No    Lack of Transportation (Non-Medical): No  Physical Activity: Not on file  Stress: Not on file  Social Connections: Not on file    Allergies:  Allergies  Allergen Reactions   Ace Inhibitors Other (See Comments)    Patient had acute renal failure requiring hemodialysis after a suicide attempt.  Nephro recommended avoiding use.   Haldol [Haloperidol Lactate] Other (See Comments)    Tardive dyskinesia   Nsaids Other (See Comments)    Nephro recommended avoiding after acute renal failure requiring hemodialysis associated with a suicide attempt.   Entresto [Sacubitril-Valsartan] Itching   Latex Other (See Comments)    Rash around IV site    Metabolic  Disorder Labs: Lab Results  Component Value Date   HGBA1C 8.5 (A) 10/17/2022   MPG 174.29 07/09/2022   MPG 243.17 11/14/2017   No results found for: "PROLACTIN" Lab Results  Component Value Date   CHOL 201 (H) 04/15/2021   TRIG 231 (H) 04/15/2021   HDL 49 04/15/2021   CHOLHDL 4.1 04/15/2021   VLDL 46 (H) 04/10/2015   LDLCALC 112 (H) 04/15/2021   LDLCALC 125 (H) 12/19/2020   Lab Results  Component Value Date   TSH 1.015 02/20/2022   TSH 2.689 05/19/2021    Therapeutic Level Labs: No results found for: "LITHIUM" No results found for: "VALPROATE" No results found for: "CBMZ"  Current Medications: Current Outpatient Medications  Medication  Sig Dispense Refill   albuterol (VENTOLIN HFA) 108 (90 Base) MCG/ACT inhaler Inhale 2 puffs into the lungs every 6 (six) hours as needed for shortness of breath. 18 g 5   blood glucose meter kit and supplies KIT Dispense based on patient and insurance preference. Use up to four times daily as directed. 1 each 0   DULoxetine 40 MG CPEP Take 1 capsule (40 mg total) by mouth 2 (two) times daily. 180 capsule 1   fluticasone (FLONASE) 50 MCG/ACT nasal spray Place 1 spray into both nostrils daily. 1 spray in each nostril every day 16 g 1   fluticasone-salmeterol (WIXELA INHUB) 250-50 MCG/ACT AEPB Inhale 1 puff into the lungs in the morning and at bedtime. 60 each 1   furosemide (LASIX) 40 MG tablet Take 1 tablet by mouth once daily 30 tablet 0   hydrOXYzine (ATARAX) 50 MG tablet Take 1 tablet (50 mg total) by mouth 3 (three) times daily as needed for anxiety. 90 tablet 3   insulin glargine (LANTUS) 100 UNIT/ML Solostar Pen Inject 65 Units into the skin 2 (two) times daily. 15 mL 2   Insulin Pen Needle 32G X 4 MM MISC 1 each by Does not apply route 3 (three) times daily. 300 each 2   isosorbide-hydrALAZINE (BIDIL) 20-37.5 MG tablet Take 1 tablet by mouth 3 (three) times daily. 90 tablet 0   levothyroxine (SYNTHROID) 300 MCG tablet Take 1 tablet (300  mcg total) by mouth daily at 6 (six) AM. 90 tablet 2   LORazepam (ATIVAN) 1 MG tablet Take 1 tablet by mouth twice daily as needed for anxiety 45 tablet 0   losartan (COZAAR) 25 MG tablet Take 1 tablet (25 mg total) by mouth at bedtime. 30 tablet 1   metFORMIN (GLUCOPHAGE-XR) 500 MG 24 hr tablet Take 2 tablets (1,000 mg total) by mouth 2 (two) times daily with a meal. 360 tablet 1   methocarbamol (ROBAXIN) 500 MG tablet Take 2 tablets by mouth twice daily 120 tablet 0   metoprolol succinate (TOPROL-XL) 50 MG 24 hr tablet Take 1 tablet (50 mg total) by mouth at bedtime. Take with or immediately following a meal. 90 tablet 3   mirtazapine (REMERON) 45 MG tablet Take 1 tablet (45 mg total) by mouth at bedtime. 30 tablet 3   NOVOLOG FLEXPEN 100 UNIT/ML FlexPen INJECT 15 UNITS SUBCUTANEOUSLY WITH LUNCH AND INJECT 20 UNITS SUBCUTANEOUSLY WITH SUPPER 15 mL 2   oxyCODONE-acetaminophen (PERCOCET) 5-325 MG tablet Take 1 tablet by mouth every 12 (twelve) hours as needed for severe pain. 30 tablet 0   QUEtiapine (SEROQUEL) 25 MG tablet Take 2 tablets (50 mg total) by mouth at bedtime. 60 tablet 3   rosuvastatin (CRESTOR) 20 MG tablet Take 1 tablet (20 mg total) by mouth daily. 30 tablet 0   Semaglutide, 2 MG/DOSE, (OZEMPIC, 2 MG/DOSE,) 8 MG/3ML SOPN INJECT 2 MG INTO THE SKIN ONCE A WEEK 3 mL 3   No current facility-administered medications for this visit.     Musculoskeletal: Strength & Muscle Tone: within normal limits, telehealth visit Gait & Station: normal, telehealth visit Patient leans: N/A  Psychiatric Specialty Exam: Review of Systems  There were no vitals taken for this visit.There is no height or weight on file to calculate BMI.  General Appearance: Well Groomed  Eye Contact:  Good  Speech:  Clear and Coherent and Normal Rate  Volume:  Normal  Mood:  Euthymic   Affect:  Appropriate and Congruent  Thought Process:  Coherent, Goal Directed and Linear  Orientation:  Full (Time, Place, and  Person)  Thought Content: WDL and Logical   Suicidal Thoughts:  Yes.  without intent/plan  Homicidal Thoughts:  No  Memory:  Immediate;   Good Recent;   Good Remote;   Good  Judgement:  Good  Insight:  Good  Psychomotor Activity:  Normal  Concentration:  Concentration: Good and Attention Span: Good  Recall:  Good  Fund of Knowledge: Good  Language: Good  Akathisia:  No  Handed:  Right  AIMS (if indicated): Not done  Assets:  Communication Skills Desire for Improvement Financial Resources/Insurance Housing Social Support  ADL's:  Intact  Cognition: WNL  Sleep:  Good   Screenings: AIMS    Flowsheet Row Admission (Discharged) from 02/24/2022 in [facility] INPATIENT BEHAVIORAL MEDICINE  AIMS Total Score 0      AUDIT    Flowsheet Row Admission (Discharged) from 02/24/2022 in [facility] INPATIENT BEHAVIORAL MEDICINE  Alcohol Use Disorder Identification Test Final Score (AUDIT) 0      GAD-7    Flowsheet Row Video Visit from 02/20/2023 in [facility] Video Visit from 12/24/2022 in [facility] Video Visit from 10/21/2022 in [facility] Video Visit from 05/21/2022 in [facility] Video Visit from 03/26/2022 in [facility]  Total GAD-7 Score 8 17 20 12 14       PHQ2-9    Flowsheet Row Video Visit from 02/20/2023 in [facility] Office Visit from 12/29/2022 in [facility] - [facility] Dept Of [place]. [facility] Video Visit from 12/24/2022 in [facility] Office Visit from 12/11/2022 in [facility] - A Dept Of [place]. [facility] Office Visit from 11/14/2022 in [facility] - A Dept Of [place]  PHQ-2 Total Score 4 6 6 6 6   PHQ-9 Total Score 7 24 25 24 24       Flowsheet Row Video Visit from 12/24/2022 in [facility] Most recent reading at 12/24/2022  8:17 AM ED from 10/21/2022 in [facility] Emergency Department at [facility] Most recent reading at 10/21/2022 10:06 PM Video Visit from 10/21/2022 in [facility] Most recent reading at 10/21/2022 11:26 AM  C-SSRS RISK CATEGORY Error: Q7 should not be populated when Q6 is No No Risk Error: Q7 should not be populated when Q6 is No        Assessment and Plan: Patient reports that her anxiety, depression, sleep, and psychosis has improved since starting Seroquel.  Overall she notes that she is doing well.  No medication changes made today.  Patient agreed to continue medication as prescribed.  1. Generalized anxiety disorder  Continue- hydrOXYzine (ATARAX) 50 MG tablet; Take 1 tablet (50 mg total) by mouth 3 (three) times daily as needed for anxiety.  Dispense: 90 tablet; Refill: 3 Continue- QUEtiapine (SEROQUEL) 25 MG tablet; Take 2 tablets (50 mg total) by mouth at bedtime.  Dispense: 60 tablet; Refill: 3 Continue- mirtazapine (REMERON) 45 MG tablet; Take 1 tablet (45 mg total) by mouth at bedtime.  Dispense: 30 tablet; Refill: 3  2. Severe recurrent major depressive disorder with psychotic features (HCC)  Continue- QUEtiapine (SEROQUEL) 25 MG tablet; Take 2 tablets (50 mg total) by mouth at bedtime.  Dispense: 60 tablet; Refill: 3 Continue- mirtazapine (REMERON)  45 MG tablet; Take 1 tablet (45 mg total) by mouth at bedtime.  Dispense: 30 tablet; Refill: 3   Follow-up in 2 months  Shanna Cisco, NP 02/20/2023, 8:59 AM

## 2023-02-21 NOTE — Telephone Encounter (Signed)
PDMP reviewed and appropriate.  Last refilled lorazepam 1 mg twice daily and 01/16/2023.

## 2023-02-27 ENCOUNTER — Other Ambulatory Visit: Payer: Self-pay

## 2023-02-27 ENCOUNTER — Telehealth: Payer: Self-pay | Admitting: Family Medicine

## 2023-02-27 DIAGNOSIS — G629 Polyneuropathy, unspecified: Secondary | ICD-10-CM

## 2023-02-27 DIAGNOSIS — E118 Type 2 diabetes mellitus with unspecified complications: Secondary | ICD-10-CM

## 2023-02-27 DIAGNOSIS — E236 Other disorders of pituitary gland: Secondary | ICD-10-CM

## 2023-02-27 MED ORDER — INSULIN GLARGINE 100 UNIT/ML SOLOSTAR PEN: 65.0000 [IU] | PEN_INJECTOR | Freq: Two times a day (BID) | SUBCUTANEOUS | 2 refills | Status: DC

## 2023-02-27 NOTE — Telephone Encounter (Signed)
Called patient regarding MRI results.   Reports she is having continued pain consistent with prior.  Seen by neurosurgery who reportedly told patient they are considering surgery if she loses around 25 pounds.  Patient motivated for weight loss and interested in surgery in the future.  States she was referred to neurology by them for evaluation of neuropathy.  Reports unable to get an appointment until 05/2023.   MRI showed some chronic nonspecific changes and partially empty sella turcica.  Will refer to neurology for further evaluation of ICH and neuropathy as above.  MRI also showed 4 mm lesion on pituitary gland and recommended endocrinologic testing.  Most likely incidental finding, lower concern for insidious pathology.  Discussed referral to endocrinology in the future as patient has more pressing concerns with ongoing back pain and neurology evaluation as above.  Elberta Fortis, DO

## 2023-03-12 ENCOUNTER — Ambulatory Visit: Payer: 59 | Admitting: Family Medicine

## 2023-03-12 ENCOUNTER — Encounter: Payer: Self-pay | Admitting: Family Medicine

## 2023-03-12 VITALS — BP 128/80 | HR 113 | Ht 71.0 in | Wt 306.0 lb

## 2023-03-12 DIAGNOSIS — E1122 Type 2 diabetes mellitus with diabetic chronic kidney disease: Secondary | ICD-10-CM | POA: Diagnosis not present

## 2023-03-12 DIAGNOSIS — M5442 Lumbago with sciatica, left side: Secondary | ICD-10-CM | POA: Diagnosis not present

## 2023-03-12 DIAGNOSIS — Z794 Long term (current) use of insulin: Secondary | ICD-10-CM | POA: Diagnosis not present

## 2023-03-12 DIAGNOSIS — M5432 Sciatica, left side: Secondary | ICD-10-CM

## 2023-03-12 DIAGNOSIS — G8929 Other chronic pain: Secondary | ICD-10-CM

## 2023-03-12 DIAGNOSIS — Z0000 Encounter for general adult medical examination without abnormal findings: Secondary | ICD-10-CM | POA: Diagnosis not present

## 2023-03-12 DIAGNOSIS — I5042 Chronic combined systolic (congestive) and diastolic (congestive) heart failure: Secondary | ICD-10-CM | POA: Diagnosis not present

## 2023-03-12 LAB — POCT GLYCOSYLATED HEMOGLOBIN (HGB A1C): Hemoglobin A1C: 8.6 % — AB (ref 4.0–5.6)

## 2023-03-12 MED ORDER — MOUNJARO 5 MG/0.5ML ~~LOC~~ SOAJ: 5.0000 mg | SUBCUTANEOUS | 1 refills | Status: DC

## 2023-03-12 MED ORDER — ISOSORB DINITRATE-HYDRALAZINE 20-37.5 MG PO TABS: 1.0000 | ORAL_TABLET | Freq: Three times a day (TID) | ORAL | 0 refills | Status: DC

## 2023-03-12 MED ORDER — METHOCARBAMOL 1000 MG PO TABS: 1000.0000 mg | ORAL_TABLET | Freq: Three times a day (TID) | ORAL | 1 refills | Status: DC

## 2023-03-12 MED ORDER — METOPROLOL SUCCINATE ER 50 MG PO TB24: 50.0000 mg | ORAL_TABLET | Freq: Every day | ORAL | 3 refills | Status: DC

## 2023-03-12 MED ORDER — DULOXETINE HCL 40 MG PO CPEP: 40.0000 mg | ORAL_CAPSULE | Freq: Two times a day (BID) | ORAL | 1 refills | Status: DC

## 2023-03-12 NOTE — Progress Notes (Signed)
    SUBJECTIVE:   CHIEF COMPLAINT / HPI:   Sciatica Ongoing pain.  Per patient report seen by neurosurgery and told she needs to lose 15 pounds and they would consider surgical intervention.  They also referred her to [facility] for neuropathy, states she has an appointment in 05/2023.  Previously referred to another neurologist for possible sooner appointment but referral was declined.  Planning to see pain clinic on Monday for possible medication benefit.  Does like methocarbamol  does relieve some of her symptoms.  Dyspnea upon exertion, peripheral edema Patient reports worsening symptoms over the past few weeks that come and go.  States she does not have swelling today but it was present yesterday in the office was not open.  Has not been able to see cardiology due to financial limitations and last echo in 05/2021.  Reports compliance on heart failure medication and has been taking furosemide  40 mg daily.  PERTINENT  PMH / PSH: Sciatica with L sided radiculopathy, T2DM, bipolar depression, CHF, CKD4   OBJECTIVE:   BP 128/80   Pulse (!) 113   Ht 5' 11 (1.803 m)   Wt (!) 306 lb (138.8 kg)   SpO2 100%   BMI 42.68 kg/m    General: Alert, no apparent distress, well groomed HEENT: Normocephalic, atraumatic, moist mucus membranes, neck supple Respiratory: Normal respiratory effort GI: Non-distended Ext: No peripheral edema noted.  Alternates between leaning significantly towards left side and sitting up straight at the edge of the chair. Skin: No rashes, no jaundice Psych: Mood lability, fluctuates from crying and upset to laughing and smiling rapidly  ASSESSMENT/PLAN:   Assessment & Plan Type 2 diabetes mellitus with chronic kidney disease, with long-term current use of insulin , unspecified CKD stage (HCC) A1c 8.6, increased from 8.5 in 10/2022.  Has not been taking metformin  during that time due to side effects, will discontinue.  On max dose Ozempic  and given goal for weight loss for  potential surgery, will try switching to Mounjaro  for additional blood sugar and weight benefit. -Stop Ozempic  -Start Mounjaro  5 mg weekly, titrate up every 2 weeks if no side effects -Encourage patient to continue taking statin Chronic bilateral low back pain with left-sided sciatica Ongoing, symptomatically uncontrolled.  Patient reports she was seen by neurosurgery who recommended losing ~15 pounds before they would consider surgery (no records available in chart).  Will attempt to assist in further weight loss by switching to Mounjaro  as above.  Some symptomatic relief methocarbamol , will increase from twice daily to 3 times daily dosing.  Pending neurology follow-up for possible neuropathy component, provided information look for [place] [facility] to call about sooner appointment. -Completed disability paperwork -Increase to methocarbamol  1000 mg TID -Refill duloxetine  40 mg twice daily Chronic combined systolic and diastolic congestive heart failure (HCC) Endorsing some peripheral edema and worsening shortness of breath recently concerning for heart failure exacerbation but appears euvolemic upon exam today.  Last echo in 05/2021 showed EF 35 to 40% and global hypokinesis, will obtain updated imaging due to concern for HF progression.  Patient reports she is unable to see cardiology due to financial limitations. -Echo -Refilled BiDil  and metoprolol  Healthcare maintenance Discussed need for mammography, Pap smear and Cologuard for cancer screening.  Patient adamant she is unable to complete these at this time due to pain and acknowledges risks of possible missed early detection of cancer.   Dr. Izetta Nap, DO [facility]

## 2023-03-12 NOTE — Patient Instructions (Addendum)
 It was wonderful to see you today! Thank you for choosing [facility].   Please bring ALL of your medications with you to every visit.   Today we talked about:  I am switching you from Ozempic  to Mounjaro  to better help with your weight loss and blood sugar control.  Please take the Mounjaro  5 mg weekly and we will rapidly increase the dose since you were already on Ozempic .  You can stop taking the metformin  since your A1c did not change since last checked and you have not been on it.  I do recommend you take the rosuvastatin  as it lowers the risk of serious heart disease. You can call [place] neurology and see if they have a sooner appointment for new patients.  Their phone number is [phone].  If they can get you in sooner please let me know and I can put in the referral again. We are getting an ultrasound of your heart due to your shortness of breath and leg swelling.  Will call you with that information and follow-up on the results. I increased her methocarbamol  to 3 times daily for pain relief.  Please follow-up with the pain clinic on Monday for further discussion of pain management.  Please follow up in 1 month  If you haven't already, sign up for My Chart to have easy access to your labs results, and communication with your primary care physician.  Call the clinic at [phone] if your symptoms worsen or you have any concerns.  Please be sure to schedule follow up at the front desk before you leave today.   Izetta Nap, DO Family Medicine

## 2023-03-13 ENCOUNTER — Telehealth: Payer: Self-pay

## 2023-03-13 NOTE — Assessment & Plan Note (Addendum)
 Ongoing, symptomatically uncontrolled.  Patient reports she was seen by neurosurgery who recommended losing ~15 pounds before they would consider surgery (no records available in chart).  Will attempt to assist in further weight loss by switching to Mounjaro  as above.  Some symptomatic relief methocarbamol , will increase from twice daily to 3 times daily dosing.  Pending neurology follow-up for possible neuropathy component, provided information look for  neurology to call about sooner appointment. -Completed disability paperwork -Increase to methocarbamol  1000 mg TID -Refill duloxetine  40 mg twice daily

## 2023-03-13 NOTE — Telephone Encounter (Signed)
 Pharmacy Patient Advocate Encounter  Received notification from [employer] [facility] that Prior Authorization for [facility] has been APPROVED from 03/13/23 to 03/12/26   PA #/Case ID/Reference #: [id number]

## 2023-03-13 NOTE — Assessment & Plan Note (Signed)
 Discussed need for mammography, Pap smear and Cologuard for cancer screening.  Patient adamant she is unable to complete these at this time due to pain and acknowledges risks of possible missed early detection of cancer.

## 2023-03-13 NOTE — Telephone Encounter (Signed)
 Pharmacy Patient Advocate Encounter   Received notification from CoverMyMeds that prior authorization for MOUNJARO  5MG  is required/requested.     The patient is insured through [employer] [facility] .    PA required; PA started via CoverMyMeds. KEY [id number] . Waiting for clinical questions to populate.

## 2023-03-13 NOTE — Telephone Encounter (Signed)
 Pharmacy Patient Advocate Encounter   PA required; PA submitted to above mentioned insurance via CoverMyMeds Key/confirmation #/EOC [facility]. Status is pending

## 2023-03-13 NOTE — Assessment & Plan Note (Signed)
 Endorsing some peripheral edema and worsening shortness of breath recently concerning for heart failure exacerbation but appears euvolemic upon exam today.  Last echo in 05/2021 showed EF 35 to 40% and global hypokinesis, will obtain updated imaging due to concern for HF progression.  Patient reports she is unable to see cardiology due to financial limitations. -Echo -Refilled BiDil  and metoprolol 

## 2023-03-13 NOTE — Assessment & Plan Note (Signed)
 A1c 8.6, increased from 8.5 in 10/2022.  Has not been taking metformin  during that time due to side effects, will discontinue.  On max dose Ozempic  and given goal for weight loss for potential surgery, will try switching to Mounjaro  for additional blood sugar and weight benefit. -Stop Ozempic  -Start Mounjaro  5 mg weekly, titrate up every 2 weeks if no side effects -Encourage patient to continue taking statin

## 2023-03-16 ENCOUNTER — Telehealth: Payer: Self-pay

## 2023-03-16 DIAGNOSIS — G8929 Other chronic pain: Secondary | ICD-10-CM

## 2023-03-16 MED ORDER — METHOCARBAMOL 500 MG PO TABS: 1000.0000 mg | ORAL_TABLET | Freq: Three times a day (TID) | ORAL | 1 refills | Status: DC

## 2023-03-16 NOTE — Telephone Encounter (Signed)
 Received call from pharmacist regarding methocarbamol  prescription.   Pharmacist reports that medication does not come in 1,000 mg tablets. She states that this dosage is only available for injections.   Pharmacist is needing a new prescription with clarification on dosing.   Forwarding to prescriber.   Chiquita JAYSON English, RN

## 2023-03-19 ENCOUNTER — Other Ambulatory Visit: Payer: Self-pay | Admitting: Family Medicine

## 2023-03-19 DIAGNOSIS — F411 Generalized anxiety disorder: Secondary | ICD-10-CM

## 2023-03-20 NOTE — Telephone Encounter (Signed)
 PDMP reviewed and appropriate. Last refilled Lorazepam 1mg  #45 on 02/21/2023.

## 2023-03-27 ENCOUNTER — Ambulatory Visit (HOSPITAL_COMMUNITY)
Admission: RE | Admit: 2023-03-27 | Discharge: 2023-03-27 | Disposition: A | Discharge status: Home / Self Care | Payer: 59 | Source: Ambulatory Visit | Attending: Family Medicine | Admitting: Family Medicine

## 2023-03-27 ENCOUNTER — Encounter: Payer: Self-pay | Admitting: *Deleted

## 2023-03-27 DIAGNOSIS — I5042 Chronic combined systolic (congestive) and diastolic (congestive) heart failure: Secondary | ICD-10-CM | POA: Diagnosis present

## 2023-03-27 DIAGNOSIS — Z006 Encounter for examination for normal comparison and control in clinical research program: Secondary | ICD-10-CM

## 2023-03-27 LAB — ECHOCARDIOGRAM COMPLETE
Area-P 1/2: 5.02 cm2
Calc EF: 42 %
S' Lateral: 3.9 cm
Single Plane A2C EF: 47.5 %
Single Plane A4C EF: 40.2 %

## 2023-03-27 MED ORDER — PERFLUTREN LIPID MICROSPHERE
1.0000 mL | INTRAVENOUS | Status: AC | PRN
  Administered 2023-03-27: 2 mL via INTRAVENOUS

## 2023-03-27 NOTE — Research (Signed)
SITE: 050     Subject #[id number]   Subprotocol: A  Inclusion Criteria  Patients who meet all of the following criteria are eligible for enrollment as study participants:  Yes No  Age > 54 years old X   Eligible to wear Holter Study X    Exclusion Criteria  Patients who meet any of these criteria are not eligible for enrollment as study participants: Yes No  1. Receiving any mechanical (respiratory or circulatory) or renal support therapy at Screening or during Visit #1.  X  2.  Any other conditions that in the opinion of the investigators are likely to prevent compliance with the study protocol or pose a safety concern if the subject participates in the study.  X  3. Poor tolerance, namely susceptible to severe skin allergies from ECG adhesive patch application.  X   Protocol: REV H                                     Residential Zip code*  [address] (First 3 digits ONLY)                                            [facility] Informed Consent   Subject Name: [name]  Subject met inclusion and exclusion criteria.  The informed consent form, study requirements and expectations were reviewed with the subject. Subject had opportunity to read consent and questions and concerns were addressed prior to the signing of the consent form.  The subject verbalized understanding of the trial requirements.  The subject agreed to participate in the EF ACT trial and signed the informed consent at 1340 on 03/27/2023.  The informed consent was obtained prior to performance of any protocol-specific procedures for the subject.  A copy of the signed informed consent was given to the subject and a copy was placed in the subject's medical record.   Brunilda Payor          Current Outpatient Medications:    albuterol (VENTOLIN HFA) 108 (90 Base) MCG/ACT inhaler, Inhale 2 puffs into the lungs every 6 (six) hours as needed for shortness of breath., Disp: 18 g, Rfl: 5   blood glucose meter kit and supplies KIT,  Dispense based on patient and insurance preference. Use up to four times daily as directed., Disp: 1 each, Rfl: 0   DULoxetine HCl 40 MG CPEP, Take 1 capsule (40 mg total) by mouth 2 (two) times daily., Disp: 180 capsule, Rfl: 1   fluticasone (FLONASE) 50 MCG/ACT nasal spray, Place 1 spray into both nostrils daily. 1 spray in each nostril every day, Disp: 16 g, Rfl: 1   fluticasone-salmeterol (WIXELA INHUB) 250-50 MCG/ACT AEPB, Inhale 1 puff into the lungs in the morning and at bedtime., Disp: 60 each, Rfl: 1   furosemide (LASIX) 40 MG tablet, Take 1 tablet by mouth once daily, Disp: 30 tablet, Rfl: 1   hydrOXYzine (ATARAX) 50 MG tablet, Take 1 tablet (50 mg total) by mouth 3 (three) times daily as needed for anxiety., Disp: 90 tablet, Rfl: 3   insulin glargine (LANTUS) 100 UNIT/ML Solostar Pen, Inject 65 Units into the skin 2 (two) times daily., Disp: 15 mL, Rfl: 2   Insulin Pen Needle 32G X 4 MM MISC, 1 each by Does not apply route 3 (  three) times daily., Disp: 300 each, Rfl: 2   isosorbide-hydrALAZINE (BIDIL) 20-37.5 MG tablet, Take 1 tablet by mouth 3 (three) times daily., Disp: 90 tablet, Rfl: 0   levothyroxine (SYNTHROID) 300 MCG tablet, Take 1 tablet (300 mcg total) by mouth daily at 6 (six) AM., Disp: 90 tablet, Rfl: 2   LORazepam (ATIVAN) 1 MG tablet, Take 1 tablet by mouth twice daily as needed for anxiety, Disp: 45 tablet, Rfl: 0   losartan (COZAAR) 25 MG tablet, Take 1 tablet (25 mg total) by mouth at bedtime., Disp: 30 tablet, Rfl: 1   methocarbamol (ROBAXIN) 500 MG tablet, Take 2 tablets (1,000 mg total) by mouth 3 (three) times daily., Disp: 180 tablet, Rfl: 1   metoprolol succinate (TOPROL-XL) 50 MG 24 hr tablet, Take 1 tablet (50 mg total) by mouth at bedtime. Take with or immediately following a meal., Disp: 90 tablet, Rfl: 3   mirtazapine (REMERON) 45 MG tablet, Take 1 tablet (45 mg total) by mouth at bedtime., Disp: 30 tablet, Rfl: 3   NOVOLOG FLEXPEN 100 UNIT/ML FlexPen, INJECT 15  UNITS SUBCUTANEOUSLY WITH LUNCH AND INJECT 20 UNITS WITH SUPPER, Disp: 15 mL, Rfl: 3   oxyCODONE-acetaminophen (PERCOCET) 5-325 MG tablet, Take 1 tablet by mouth every 12 (twelve) hours as needed for severe pain., Disp: 30 tablet, Rfl: 0   QUEtiapine (SEROQUEL) 25 MG tablet, Take 2 tablets (50 mg total) by mouth at bedtime., Disp: 60 tablet, Rfl: 3   rosuvastatin (CRESTOR) 20 MG tablet, Take 1 tablet (20 mg total) by mouth daily., Disp: 30 tablet, Rfl: 0   tirzepatide (MOUNJARO) 5 MG/0.5ML Pen, Inject 5 mg into the skin once a week., Disp: 2 mL, Rfl: 1 No current facility-administered medications for this visit.  Facility-Administered Medications Ordered in Other Visits:    perflutren lipid microspheres (DEFINITY) IV suspension, 1-10 mL, Intravenous, PRN, Carney Living, MD, 2 mL at 03/27/23 1337

## 2023-03-30 ENCOUNTER — Encounter: Payer: Self-pay | Admitting: Family Medicine

## 2023-04-13 NOTE — Progress Notes (Deleted)
    SUBJECTIVE:   CHIEF COMPLAINT / HPI:   Diabetes Current Regimen: Mounjaro  5mg  weekly CBGs: ***  Last A1c:  Lab Results  Component Value Date   HGBA1C 8.6 (A) 03/12/2023    Denies polyuria, polydipsia, hypoglycemia *** Last Eye Exam: *** Statin: *** ACE/ARB: ***   *Pneumonia vaccine  PERTINENT  PMH / PSH: ***  OBJECTIVE:   There were no vitals taken for this visit. ***  General: NAD, pleasant, able to participate in exam Cardiac: RRR, no murmurs. Respiratory: CTAB, normal effort, No wheezes, rales or rhonchi Abdomen: Bowel sounds present, nontender, nondistended Extremities: no edema or cyanosis. Skin: warm and dry, no rashes noted Neuro: alert, no obvious focal deficits Psych: Normal affect and mood  ASSESSMENT/PLAN:   No problem-specific Assessment & Plan notes found for this encounter.     Dr. Izetta Nap, DO [facility]    {    This will disappear when note is signed, click to select method of visit    :1}

## 2023-04-14 ENCOUNTER — Ambulatory Visit: Payer: 59 | Admitting: Family Medicine

## 2023-04-14 ENCOUNTER — Telehealth: Payer: Self-pay | Admitting: Family Medicine

## 2023-04-14 NOTE — Telephone Encounter (Signed)
Son dropped off form at front desk for disability.  Verified that patient section of form has been completed.  Last DOS/WCC with PCP was 03/12/23.  Placed form in red team folder to be completed by clinical staff.  Vilinda Blanks

## 2023-04-17 NOTE — Telephone Encounter (Signed)
 Form has been placed in your box to be completed, please finish when you have time. Once completed, please place form in RN box and route message to the RN team. Thanks! Alain Howard CMA

## 2023-04-20 NOTE — Telephone Encounter (Signed)
 Form placed up front for pick up.   Copy made for batch scanning.   Patient aware.

## 2023-04-23 ENCOUNTER — Encounter (HOSPITAL_COMMUNITY): Payer: Self-pay | Admitting: Psychiatry

## 2023-04-23 ENCOUNTER — Telehealth (HOSPITAL_COMMUNITY): Payer: Self-pay | Admitting: Psychiatry

## 2023-04-23 DIAGNOSIS — F333 Major depressive disorder, recurrent, severe with psychotic symptoms: Secondary | ICD-10-CM | POA: Diagnosis not present

## 2023-04-23 DIAGNOSIS — F411 Generalized anxiety disorder: Secondary | ICD-10-CM | POA: Diagnosis not present

## 2023-04-23 MED ORDER — QUETIAPINE FUMARATE 100 MG PO TABS: 100.0000 mg | ORAL_TABLET | Freq: Every day | ORAL | 3 refills | Status: DC

## 2023-04-23 MED ORDER — GABAPENTIN 300 MG PO CAPS: 300.0000 mg | ORAL_CAPSULE | Freq: Three times a day (TID) | ORAL | 3 refills | Status: DC

## 2023-04-23 MED ORDER — FLUOXETINE HCL 10 MG PO CAPS: 10.0000 mg | ORAL_CAPSULE | Freq: Every day | ORAL | 3 refills | Status: DC

## 2023-04-23 MED ORDER — HYDROXYZINE HCL 50 MG PO TABS: 50.0000 mg | ORAL_TABLET | Freq: Three times a day (TID) | ORAL | 3 refills | Status: DC | PRN

## 2023-04-23 MED ORDER — MIRTAZAPINE 45 MG PO TABS: 45.0000 mg | ORAL_TABLET | Freq: Every day | ORAL | 3 refills | Status: DC

## 2023-04-23 NOTE — Progress Notes (Signed)
BH MD/PA/NP OP Progress Note Virtual Visit via Video Note  I connected with [name] on 04/23/23 at  3:00 PM EST by a video enabled telemedicine application and verified that I am speaking with the correct person using two identifiers.  Location: Patient:Home Provider: Clinic   I discussed the limitations of evaluation and management by telemedicine and the availability of in person appointments. The patient expressed understanding and agreed to proceed.  I provided 30 minutes of non-face-to-face time during this encounter.       04/23/2023 12:48 PM [name]  MRN:  [id number]  Chief Complaint: "I have been having rough days"  HPI: 53 year old female seen today for follow up psychiatric evaluation. She has a psychiatric history of  OCD, adjustment disorder, anxiety, depression, SI, and bipolar disorder. She is currently being managed on  Cymbalta 40 daily (prescribed by PCP), Seroquel 50 mg nightly, Vistaril 50 mg three times daily, and mirtazapine 45 mg at bedtime.  She informed Clinical research associate that she can no longer afford Cymbalta and reports that her other medications are somewhat effective in managing her psychiatric conditions.  Today she was well-groomed, pleasant, cooperative, and engaged in conversation. She informed Clinical research associate that she has been in constant pain for over a year. She notes that she went to pain management and was not given medications to help manage her pain. Patient notes that at times she thinks about harming herself because of her intense back pain. She quantifies it as 10/10.  She notes that is difficult to walk, bathe, or use the bathroom.  She cannot ambulate without a walker or a cane.  Patient notes that her neurologist is recommending surgery by her pain management doctor and is not recommending surgery.  She notes that she will start physical therapy soon.  Patient reports she does not feel that her concerns are being addressed by pain management doctor.   Provider informed patient to have her neurologist referred her to a different provider for pain or communicate that she does not feel like she is being heard with her current pain management doctor.  Since her last visit she notes that she discontinued Duloxetine because she could not afford it. She notes that she has been without it for a week but notes that she found it ineffective. Patient notes that she wants to restart Prozac. Patient notes that her sleep is effected by her pain. She notes that she sleep 3 hours nightly. At times she goes without sleep for 24 hours and starts having AH. She notes that her voices tells her to harm herself. She does endorses passive SI but denies wanting to harm herself.  Patient denies having guns or other weapons in her home.  She notes that she fears harming herself as it may not work.  Patient does contract for safety today.  Provider informed patient that if her suicidal ideation persist to come in to [facility] urgent care.  Provider also gave patient resources to the suicide hotline at 1.  Today provider conducted a GAD-7 and patient scored a 21, at her last visit she scored an 8.  Provider also conducted PHQ-9 and patient scored a23, at her last visit she scored a 7.  Today she denies VH, mania, paranoia.  At this time patient would like to discontinue duloxetine as she cannot afford it.  She will restart Prozac 10 mg to help manage anxiety and depression.  Seroquel 50 mg increased to 100 mg to help symptoms of psychosis, sleep,  anxiety, depression.  In the past patient found gabapentin ineffective but would like to retry it to help manage mood and pain.  Gabapentin 300 mg 3 times daily restarted.  Potential side effects of medication and risks vs benefits of treatment vs non-treatment were explained and discussed. All questions were answered.  She will continue other medications as prescribed.  No other concerns at this time.     Visit Diagnosis:    ICD-10-CM    1. Generalized anxiety disorder  F41.1 gabapentin (NEURONTIN) 300 MG capsule    hydrOXYzine (ATARAX) 50 MG tablet    QUEtiapine (SEROQUEL) 100 MG tablet    mirtazapine (REMERON) 45 MG tablet    FLUoxetine (PROZAC) 10 MG capsule    2. Severe recurrent major depressive disorder with psychotic features (HCC)  F33.3 gabapentin (NEURONTIN) 300 MG capsule    QUEtiapine (SEROQUEL) 100 MG tablet    mirtazapine (REMERON) 45 MG tablet    FLUoxetine (PROZAC) 10 MG capsule           Past Psychiatric History: OCD, adjustment disorder, anxiety, depression, SI, and bipolar disorder.   Past Medical History:  Past Medical History:  Diagnosis Date   Acute renal failure (HCC) 05/27/10   hemodialysis for 6 weeks   Anxiety    Asthma    Depression    Diabetes mellitus    Hypertension    Rhabdomyolysis 06/06/10   after drug overdose   Suicide attempt by drug ingestion (HCC) 06/05/10   result rhabdomyolosis and ARF requrining dialysis     Past Surgical History:  Procedure Laterality Date   ANKLE ARTHROSCOPY Right 08/17/2015   Procedure: RIGHT ANKLE ARTHROSCOPY WITH SYNOVECTOMY AND LOOSE BODY EXCISION;  Surgeon: Marcene Corning, MD;  Location: [facility] OR;  Service: Orthopedics;  Laterality: Right;   CHOLECYSTECTOMY     TUBAL LIGATION  1998    Family Psychiatric History: Unknown  Family History:  Family History  Problem Relation Age of Onset   Asthma Father     Social History:  Social History   Socioeconomic History   Marital status: Married    Spouse name: Not on file   Number of children: 2   Years of education: Not on file   Highest education level: Not on file  Occupational History   Occupation: Research officer, trade union  Tobacco Use   Smoking status: Never    Passive exposure: Never   Smokeless tobacco: Never  Vaping Use   Vaping status: Never Used  Substance and Sexual Activity   Alcohol use: No   Drug use: No   Sexual activity: Yes    Comment: with husband   Other Topics Concern    Not on file  Social History Narrative   Married high school boyfriend at age 36, two boys, still married but he has been living with other women for years.   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         Social Drivers of Corporate investment banker Strain: Not on file  Food Insecurity: No Food Insecurity (07/08/2022)   Hunger Vital Sign    Worried About Programme researcher, broadcasting/film/video in the Last Year: Never true    Ran Out of Food in the Last Year: Never true  Recent Concern: Food Insecurity - Food Insecurity Present (07/08/2022)   Hunger Vital Sign    Worried About Running Out of Food in the Last Year: Sometimes true    Ran Out  of Food in the Last Year: Sometimes true  Transportation Needs: No Transportation Needs (07/08/2022)   PRAPARE - Administrator, Civil Service (Medical): No    Lack of Transportation (Non-Medical): No  Physical Activity: Not on file  Stress: Not on file  Social Connections: Not on file    Allergies:  Allergies  Allergen Reactions   Ace Inhibitors Other (See Comments)    Patient had acute renal failure requiring hemodialysis after a suicide attempt.  Nephro recommended avoiding use.   Haldol [Haloperidol Lactate] Other (See Comments)    Tardive dyskinesia   Nsaids Other (See Comments)    Nephro recommended avoiding after acute renal failure requiring hemodialysis associated with a suicide attempt.   Entresto [Sacubitril-Valsartan] Itching   Latex Other (See Comments)    Rash around IV site    Metabolic Disorder Labs: Lab Results  Component Value Date   HGBA1C 8.6 (A) 03/12/2023   MPG 174.29 07/09/2022   MPG 243.17 11/14/2017   No results found for: "PROLACTIN" Lab Results  Component Value Date   CHOL 201 (H) 04/15/2021   TRIG 231 (H) 04/15/2021   HDL 49 04/15/2021   CHOLHDL 4.1 04/15/2021   VLDL 46 (H) 04/10/2015   LDLCALC 112 (H) 04/15/2021   LDLCALC 125 (H) 12/19/2020   Lab Results   Component Value Date   TSH 1.015 02/20/2022   TSH 2.689 05/19/2021    Therapeutic Level Labs: No results found for: "LITHIUM" No results found for: "VALPROATE" No results found for: "CBMZ"  Current Medications: Current Outpatient Medications  Medication Sig Dispense Refill   FLUoxetine (PROZAC) 10 MG capsule Take 1 capsule (10 mg total) by mouth daily. 30 capsule 3   gabapentin (NEURONTIN) 300 MG capsule Take 1 capsule (300 mg total) by mouth 3 (three) times daily. 90 capsule 3   albuterol (VENTOLIN HFA) 108 (90 Base) MCG/ACT inhaler Inhale 2 puffs into the lungs every 6 (six) hours as needed for shortness of breath. 18 g 5   blood glucose meter kit and supplies KIT Dispense based on patient and insurance preference. Use up to four times daily as directed. 1 each 0   fluticasone (FLONASE) 50 MCG/ACT nasal spray Place 1 spray into both nostrils daily. 1 spray in each nostril every day 16 g 1   fluticasone-salmeterol (WIXELA INHUB) 250-50 MCG/ACT AEPB Inhale 1 puff into the lungs in the morning and at bedtime. 60 each 1   furosemide (LASIX) 40 MG tablet Take 1 tablet by mouth once daily 30 tablet 1   hydrOXYzine (ATARAX) 50 MG tablet Take 1 tablet (50 mg total) by mouth 3 (three) times daily as needed for anxiety. 90 tablet 3   insulin glargine (LANTUS) 100 UNIT/ML Solostar Pen Inject 65 Units into the skin 2 (two) times daily. 15 mL 2   Insulin Pen Needle 32G X 4 MM MISC 1 each by Does not apply route 3 (three) times daily. 300 each 2   isosorbide-hydrALAZINE (BIDIL) 20-37.5 MG tablet Take 1 tablet by mouth 3 (three) times daily. 90 tablet 0   levothyroxine (SYNTHROID) 300 MCG tablet Take 1 tablet (300 mcg total) by mouth daily at 6 (six) AM. 90 tablet 2   LORazepam (ATIVAN) 1 MG tablet Take 1 tablet by mouth twice daily as needed for anxiety 45 tablet 0   losartan (COZAAR) 25 MG tablet Take 1 tablet (25 mg total) by mouth at bedtime. 30 tablet 1   methocarbamol (ROBAXIN) 500 MG tablet  Take  2 tablets (1,000 mg total) by mouth 3 (three) times daily. 180 tablet 1   metoprolol succinate (TOPROL-XL) 50 MG 24 hr tablet Take 1 tablet (50 mg total) by mouth at bedtime. Take with or immediately following a meal. 90 tablet 3   mirtazapine (REMERON) 45 MG tablet Take 1 tablet (45 mg total) by mouth at bedtime. 30 tablet 3   NOVOLOG FLEXPEN 100 UNIT/ML FlexPen INJECT 15 UNITS SUBCUTANEOUSLY WITH LUNCH AND INJECT 20 UNITS WITH SUPPER 15 mL 3   oxyCODONE-acetaminophen (PERCOCET) 5-325 MG tablet Take 1 tablet by mouth every 12 (twelve) hours as needed for severe pain. 30 tablet 0   QUEtiapine (SEROQUEL) 100 MG tablet Take 1 tablet (100 mg total) by mouth at bedtime. 30 tablet 3   rosuvastatin (CRESTOR) 20 MG tablet Take 1 tablet (20 mg total) by mouth daily. 30 tablet 0   tirzepatide (MOUNJARO) 5 MG/0.5ML Pen Inject 5 mg into the skin once a week. 2 mL 1   No current facility-administered medications for this visit.     Musculoskeletal: Strength & Muscle Tone: within normal limits, telehealth visit Gait & Station: normal, telehealth visit Patient leans: N/A  Psychiatric Specialty Exam: Review of Systems  There were no vitals taken for this visit.There is no height or weight on file to calculate BMI.  General Appearance: Well Groomed  Eye Contact:  Good  Speech:  Clear and Coherent and Normal Rate  Volume:  Normal  Mood:  Anxious and Depressed   Affect:  Appropriate and Congruent  Thought Process:  Coherent, Goal Directed and Linear  Orientation:  Full (Time, Place, and Person)  Thought Content: Logical and Hallucinations: Auditory   Suicidal Thoughts:  Yes.  without intent/plan  Homicidal Thoughts:  No  Memory:  Immediate;   Good Recent;   Good Remote;   Good  Judgement:  Good  Insight:  Good  Psychomotor Activity:  Normal  Concentration:  Concentration: Good and Attention Span: Good  Recall:  Good  Fund of Knowledge: Good  Language: Good  Akathisia:  No  Handed:   Right  AIMS (if indicated): Not done  Assets:  Communication Skills Desire for Improvement Financial Resources/Insurance Housing Social Support  ADL's:  Intact  Cognition: WNL  Sleep:  Poor   Screenings: AIMS    Flowsheet Row Admission (Discharged) from 02/24/2022 in [facility] INPATIENT [facility]AL MEDICINE  AIMS Total Score 0      AUDIT    Flowsheet Row Admission (Discharged) from 02/24/2022 in [facility] INPATIENT BEHAVIORAL MEDICINE  Alcohol Use Disorder Identification Test Final Score (AUDIT) 0      GAD-7    Flowsheet Row Video Visit from 04/23/2023 in [facility] Video Visit from 02/20/2023 in [facility] Video Visit from 12/24/2022 in [facility] Video Visit from 10/21/2022 in [facility] Video Visit from 05[facility]21/2022 in [facility]  Total GAD-7 Score 21 8 17 20 12       PHQ2-9    Flowsheet Row Video Visit from 04/23/2023 in [facility] Office Visit from 03/12/2023 in [facility] Of [place]. [facility] Video Visit from 02/20/2023 in [facility] Office Visit from 12/29/2022 in [facility] Of [place]. [facility] Video Visit from 12/24/2022 in [facility]  PHQ-2 Total Score 6 2 4 6  6  PHQ-9 Total Score 23 8 7 24 25       Flowsheet Row Video Visit from 04/23/2023 in [facility] Video Visit from 12/24/2022 in [facility] ED from 10/21/2022 in [facility] Emergency Department at [facility]  C-SSRS RISK CATEGORY Error: Q7 should not be populated when Q6 is No Error: Q7 should not be populated when Q6 is No No Risk        Assessment and Plan: Patient endorses increased anxiety, depression, pain, and insomnia.At this time patient  would like to discontinue duloxetine as she cannot afford it.  She will restart Prozac 10 mg to help manage anxiety and depression.  Seroquel 50 mg increased to 100 mg to help symptoms of psychosis, sleep, anxiety, depression.  In the past patient found gabapentin ineffective but would like to retry it to help manage mood and pain.  Gabapentin 300 mg 3 times daily restarted  1. Generalized anxiety disorder  Restart- gabapentin (NEURONTIN) 300 MG capsule; Take 1 capsule (300 mg total) by mouth 3 (three) times daily.  Dispense: 90 capsule; Refill: 3 - hydrOXYzine (ATARAX) 50 MG tablet; Take 1 tablet (50 mg total) by mouth 3 (three) times daily as needed for anxiety.  Dispense: 90 tablet; Refill: 3 Increased- QUEtiapine (SEROQUEL) 100 MG tablet; Take 1 tablet (100 mg total) by mouth at bedtime.  Dispense: 30 tablet; Refill: 3 Continue- mirtazapine (REMERON) 45 MG tablet; Take 1 tablet (45 mg total) by mouth at bedtime.  Dispense: 30 tablet; Refill: 3 Restart- FLUoxetine (PROZAC) 10 MG capsule; Take 1 capsule (10 mg total) by mouth daily.  Dispense: 30 capsule; Refill: 3  2. Severe recurrent major depressive disorder with psychotic features (HCC)  Restart- gabapentin (NEURONTIN) 300 MG capsule; Take 1 capsule (300 mg total) by mouth 3 (three) times daily.  Dispense: 90 capsule; Refill: 3 Increased- QUEtiapine (SEROQUEL) 100 MG tablet; Take 1 tablet (100 mg total) by mouth at bedtime.  Dispense: 30 tablet; Refill: 3 Continue- mirtazapine (REMERON) 45 MG tablet; Take 1 tablet (45 mg total) by mouth at bedtime.  Dispense: 30 tablet; Refill: 3 Restart- FLUoxetine (PROZAC) 10 MG capsule; Take 1 capsule (10 mg total) by mouth daily.  Dispense: 30 capsule; Refill: 3  Follow-up in 1 months  Shanna Cisco, NP 04/23/2023, 12:48 PM

## 2023-04-24 ENCOUNTER — Encounter (HOSPITAL_COMMUNITY): Payer: Self-pay | Admitting: Emergency Medicine

## 2023-04-24 ENCOUNTER — Observation Stay (HOSPITAL_COMMUNITY)
Admission: EM | Admit: 2023-04-24 | Discharge: 2023-04-26 | Disposition: A | Discharge status: Home / Self Care | Payer: 59 | Attending: Internal Medicine | Admitting: Internal Medicine

## 2023-04-24 ENCOUNTER — Other Ambulatory Visit: Payer: Self-pay

## 2023-04-24 DIAGNOSIS — F419 Anxiety disorder, unspecified: Secondary | ICD-10-CM | POA: Insufficient documentation

## 2023-04-24 DIAGNOSIS — M5442 Lumbago with sciatica, left side: Secondary | ICD-10-CM | POA: Diagnosis not present

## 2023-04-24 DIAGNOSIS — R45851 Suicidal ideations: Secondary | ICD-10-CM | POA: Insufficient documentation

## 2023-04-24 DIAGNOSIS — Z91040 Latex allergy status: Secondary | ICD-10-CM | POA: Diagnosis not present

## 2023-04-24 DIAGNOSIS — Z794 Long term (current) use of insulin: Secondary | ICD-10-CM | POA: Insufficient documentation

## 2023-04-24 DIAGNOSIS — E66813 Obesity, class 3: Secondary | ICD-10-CM | POA: Diagnosis present

## 2023-04-24 DIAGNOSIS — Z79899 Other long term (current) drug therapy: Secondary | ICD-10-CM | POA: Diagnosis not present

## 2023-04-24 DIAGNOSIS — M545 Low back pain, unspecified: Secondary | ICD-10-CM | POA: Diagnosis present

## 2023-04-24 DIAGNOSIS — Z6841 Body Mass Index (BMI) 40.0 and over, adult: Secondary | ICD-10-CM | POA: Diagnosis not present

## 2023-04-24 DIAGNOSIS — F332 Major depressive disorder, recurrent severe without psychotic features: Secondary | ICD-10-CM | POA: Diagnosis not present

## 2023-04-24 DIAGNOSIS — N1832 Chronic kidney disease, stage 3b: Secondary | ICD-10-CM | POA: Diagnosis present

## 2023-04-24 DIAGNOSIS — E039 Hypothyroidism, unspecified: Secondary | ICD-10-CM | POA: Diagnosis present

## 2023-04-24 DIAGNOSIS — E1140 Type 2 diabetes mellitus with diabetic neuropathy, unspecified: Secondary | ICD-10-CM | POA: Diagnosis not present

## 2023-04-24 DIAGNOSIS — E1122 Type 2 diabetes mellitus with diabetic chronic kidney disease: Secondary | ICD-10-CM | POA: Diagnosis not present

## 2023-04-24 DIAGNOSIS — E119 Type 2 diabetes mellitus without complications: Secondary | ICD-10-CM

## 2023-04-24 DIAGNOSIS — I129 Hypertensive chronic kidney disease with stage 1 through stage 4 chronic kidney disease, or unspecified chronic kidney disease: Secondary | ICD-10-CM | POA: Insufficient documentation

## 2023-04-24 DIAGNOSIS — M549 Dorsalgia, unspecified: Principal | ICD-10-CM

## 2023-04-24 DIAGNOSIS — N179 Acute kidney failure, unspecified: Secondary | ICD-10-CM

## 2023-04-24 DIAGNOSIS — I10 Essential (primary) hypertension: Secondary | ICD-10-CM | POA: Diagnosis present

## 2023-04-24 DIAGNOSIS — M5432 Sciatica, left side: Secondary | ICD-10-CM | POA: Diagnosis present

## 2023-04-24 DIAGNOSIS — E034 Atrophy of thyroid (acquired): Secondary | ICD-10-CM | POA: Diagnosis not present

## 2023-04-24 LAB — CBC
HCT: 37.4 % (ref 36.0–46.0)
Hemoglobin: 11.7 g/dL — ABNORMAL LOW (ref 12.0–15.0)
MCH: 27 pg (ref 26.0–34.0)
MCHC: 31.3 g/dL (ref 30.0–36.0)
MCV: 86.4 fL (ref 80.0–100.0)
Platelets: 521 10*3/uL — ABNORMAL HIGH (ref 150–400)
RBC: 4.33 MIL/uL (ref 3.87–5.11)
RDW: 14.1 % (ref 11.5–15.5)
WBC: 7.9 10*3/uL (ref 4.0–10.5)
nRBC: 0 % (ref 0.0–0.2)

## 2023-04-24 MED ORDER — HYDROMORPHONE HCL 1 MG/ML IJ SOLN
0.5000 mg | Freq: Once | INTRAMUSCULAR | Status: AC
  Administered 2023-04-24: 0.5 mg via INTRAVENOUS
  Filled 2023-04-24: qty 1

## 2023-04-24 NOTE — ED Triage Notes (Signed)
Pt arrives via EMS from home with c/o back pain and bilateral leg pain with no relief with home meds. Hx sciatica. States difficulty ambulating.

## 2023-04-24 NOTE — ED Provider Notes (Signed)
[facility]MENT AT [facility] Provider Note   CSN: [id number] Arrival date & time: 04/24/23  2250     History  Chief Complaint  Patient presents with   Back Pain   Leg Pain    [name] is a 53 y.o. female.  The history is provided by the patient.  Patient with history of bipolar, obesity, diabetes, chronic kidney disease presents with ongoing low back pain.  Patient reports she was diagnosed with sciatica last spring and has been progressively worsening.  She deals with back pain just about every day.  Its mostly in the left lower back and radiates to her left leg.  She reports difficulty with activities of daily living due to severe pain.  She has used a cane previously but it is getting more difficult to ambulate.  No recent falls.  No back surgery.  No abdominal pain. No fecal or urinary incontinence.  No leg weakness Reports she continually fights with insurance due to multiple medical issues.  She also reports being told by neurosurgery she is not a candidate for surgery.  She is using referral to a neurologist.  Patient reports that at times the pain is causing her to consider suicide.  She reports previous suicide attempt In the past     Home Medications Prior to Admission medications   Medication Sig Start Date End Date Taking? Authorizing Provider  albuterol (VENTOLIN HFA) 108 (90 Base) MCG/ACT inhaler Inhale 2 puffs into the lungs every 6 (six) hours as needed for shortness of breath. 09/23/22   Elberta Fortis, MD  blood glucose meter kit and supplies KIT Dispense based on patient and insurance preference. Use up to four times daily as directed. 10/01/21   Sabino Dick, DO  FLUoxetine (PROZAC) 10 MG capsule Take 1 capsule (10 mg total) by mouth daily. 04/23/23   Shanna Cisco, NP  fluticasone (FLONASE) 50 MCG/ACT nasal spray Place 1 spray into both nostrils daily. 1 spray in each nostril every day 06/10/21   Sabino Dick,  DO  fluticasone-salmeterol (WIXELA INHUB) 250-50 MCG/ACT AEPB Inhale 1 puff into the lungs in the morning and at bedtime. 09/23/22   Elberta Fortis, MD  furosemide (LASIX) 40 MG tablet Take 1 tablet by mouth once daily 02/21/23   Elberta Fortis, MD  gabapentin (NEURONTIN) 300 MG capsule Take 1 capsule (300 mg total) by mouth 3 (three) times daily. 04/23/23   Shanna Cisco, NP  hydrOXYzine (ATARAX) 50 MG tablet Take 1 tablet (50 mg total) by mouth 3 (three) times daily as needed for anxiety. 04/23/23   Shanna Cisco, NP  insulin glargine (LANTUS) 100 UNIT/ML Solostar Pen Inject 65 Units into the skin 2 (two) times daily. 02/27/23   Elberta Fortis, MD  Insulin Pen Needle 32G X 4 MM MISC 1 each by Does not apply route 3 (three) times daily. 04/23/22   Sabino Dick, DO  isosorbide-hydrALAZINE (BIDIL) 20-37.5 MG tablet Take 1 tablet by mouth 3 (three) times daily. 03/12/23   Elberta Fortis, MD  levothyroxine (SYNTHROID) 300 MCG tablet Take 1 tablet (300 mcg total) by mouth daily at 6 (six) AM. 04/22/22   Sabino Dick, DO  LORazepam (ATIVAN) 1 MG tablet Take 1 tablet by mouth twice daily as needed for anxiety 03/20/23   Elberta Fortis, MD  losartan (COZAAR) 25 MG tablet Take 1 tablet (25 mg total) by mouth at bedtime. 09/12/22   Elberta Fortis, MD  methocarbamol (ROBAXIN) 500 MG tablet Take 2  tablets (1,000 mg total) by mouth 3 (three) times daily. 03/16/23   Elberta Fortis, MD  metoprolol succinate (TOPROL-XL) 50 MG 24 hr tablet Take 1 tablet (50 mg total) by mouth at bedtime. Take with or immediately following a meal. 03/12/23   Elberta Fortis, MD  mirtazapine (REMERON) 45 MG tablet Take 1 tablet (45 mg total) by mouth at bedtime. 04/23/23   Shanna Cisco, NP  NOVOLOG FLEXPEN 100 UNIT/ML FlexPen INJECT 15 UNITS SUBCUTANEOUSLY WITH LUNCH AND INJECT 20 UNITS WITH SUPPER 02/20/23   Elberta Fortis, MD  oxyCODONE-acetaminophen (PERCOCET) 5-325 MG tablet Take 1 tablet by mouth  every 12 (twelve) hours as needed for severe pain. 10/17/22   Elberta Fortis, MD  QUEtiapine (SEROQUEL) 100 MG tablet Take 1 tablet (100 mg total) by mouth at bedtime. 04/23/23   Shanna Cisco, NP  rosuvastatin (CRESTOR) 20 MG tablet Take 1 tablet (20 mg total) by mouth daily. 02/26/22   Clapacs, Jackquline Denmark, MD  tirzepatide [facility]) 5 MG/0.5ML Pen Inject 5 mg into the skin once a week. 03/12/23   Elberta Fortis, MD  ipratropium-albuterol (DUONEB) 0.5-2.5 (3) MG/3ML SOLN Take 3 mLs by nebulization 3 (three) times daily as needed. Patient taking differently: Take 3 mLs by nebulization 3 (three) times daily as needed (sob/wheezing). 10/12/20 05/21/21  Fayrene Helper, PA-C      Allergies    Ace inhibitors, Haldol [haloperidol lactate], Nsaids, Entresto [sacubitril-valsartan], and Latex    Review of Systems   Review of Systems  Constitutional:  Negative for fever.  Gastrointestinal:  Negative for abdominal pain.  Musculoskeletal:  Positive for back pain.  Neurological:  Negative for weakness.  Psychiatric/Behavioral:  The patient is nervous/anxious.     Physical Exam Updated Vital Signs BP 127/70   Pulse 79   Temp 97.8 F (36.6 C) (Oral)   Resp 18   Ht 1.803 m (5\' 11" )   Wt 136.1 kg   SpO2 98%   BMI 41.84 kg/m  Physical Exam CONSTITUTIONAL: Well developed/well nourished, tearful HEAD: Normocephalic/atraumatic EYES: EOMI/PERRL ENMT: Mucous membranes moist NECK: supple no meningeal signs SPINE/BACK:entire spine nontender  Left lumbar paraspinal tenderness CV: S1/S2 noted, no murmurs/rubs/gallops noted LUNGS: Lungs are clear to auscultation bilaterally, no apparent distress ABDOMEN: soft, nontender, obese NEURO: Awake/alert, equal motor 5/5 strength noted with the following: hip flexion/knee flexion/extension, foot dorsi/plantar flexion, great toe extension intact bilaterally, no clonus bilaterally,  no sensory deficit in any dermatome.   Patient with significant pain of any movement  of her lower extremities EXTREMITIES: pulses normal, full ROM SKIN: warm, color normal PSYCH: Anxious and tearful  ED Results / Procedures / Treatments   Labs (all labs ordered are listed, but only abnormal results are displayed) Labs Reviewed  CBC - Abnormal; Notable for the following components:      Result Value   Hemoglobin 11.7 (*)    Platelets 521 (*)    All other components within normal limits  BASIC METABOLIC PANEL - Abnormal; Notable for the following components:   CO2 21 (*)    Glucose, Bld 148 (*)    BUN 34 (*)    Creatinine, Ser 2.52 (*)    GFR, Estimated 22 (*)    All other components within normal limits  HCG, SERUM, QUALITATIVE  ETHANOL  URINALYSIS, ROUTINE W REFLEX MICROSCOPIC  RAPID URINE DRUG SCREEN, HOSP PERFORMED    EKG None  Radiology No results found.  Procedures Procedures    Medications Ordered in ED Medications  HYDROmorphone (DILAUDID) injection  0.5 mg (0.5 mg Intravenous Given 04/24/23 2339)  HYDROmorphone (DILAUDID) injection 0.5 mg (0.5 mg Intravenous Given 04/25/23 0102)  HYDROmorphone (DILAUDID) injection 0.5 mg (0.5 mg Intravenous Given 04/25/23 0230)    ED Course/ Medical Decision Making/ A&P Clinical Course as of 04/25/23 0346  Fri Apr 24, 2023  2330 Patient w/multiple medical conditions presents with ongoing symptoms of lumbosacral radiculopathy she has had for months.  She has seen multiple specialist including neurosurgery who was reluctant to perform surgery until she has weight loss, and is also seeing pain management is now referral to neurology.  Patient reports her pain is so severe that she is now considering suicide  No focal neurodeficits while lying in the bed but she is in significant pain. Will focus on pain management and reassess.  Patient may also need behavioral health evaluation though this has been done recently as an outpatient [DW]  Sat Apr 25, 2023  0211 Creatinine(!): 2.52 Acute kidney injury [DW]  0211  Patient still with pain.  Due to intractable pain, worsening renal function, patient would benefit from admission  Patient denies any active suicidal plan at this time.  She reports she has these thoughts mostly when her pain is severe at home [DW]  0345 Discussed with Dr. Lazarus Salines for admission [DW]    Clinical Course User Index [DW] Zadie Rhine, MD                                 Medical Decision Making Amount and/or Complexity of Data Reviewed Labs: ordered. Decision-making details documented in ED Course.  Risk Prescription drug management. Decision regarding hospitalization.   This patient presents to the ED for concern of back pain, this involves an extensive number of treatment options, and is a complaint that carries with it a high risk of complications and morbidity.  The differential diagnosis includes but is not limited to muscle strain, lumbosacral radiculopathy, pyelonephritis, AAA, epidural abscess, osteomyelitis, discitis  Comorbidities that complicate the patient evaluation: Patient's presentation is complicated by their history of obesity and diabetes  Social Determinants of Health: Patient's  difficulty with activities of daily living   increases the complexity of managing their presentation  Additional history obtained: Records reviewed Primary Care Documents  Lab Tests: I Ordered, and personally interpreted labs.  The pertinent results include:  acute kidney injury  Medicines ordered and prescription drug management: I ordered medication including Dilaudid for pain Reevaluation of the patient after these medicines showed that the patient    improved  Test Considered: Patient with back pain, but no neurofindings, will defer MRI  Consultations Obtained: I requested consultation with the admitting physician triad , and discussed  findings as well as pertinent plan - they recommend: will admit  Reevaluation: After the interventions noted above, I  reevaluated the patient and found that they have :improved  Complexity of problems addressed: Patient's presentation is most consistent with  acute presentation with potential threat to life or bodily function  Disposition: After consideration of the diagnostic results and the patient's response to treatment,  I feel that the patent would benefit from admission   .           Final Clinical Impression(s) / ED Diagnoses Final diagnoses:  Intractable back pain  AKI (acute kidney injury) (HCC)    Rx / DC Orders ED Discharge Orders     None         Josey Dettmann,  Dorinda Hill, MD 04/25/23 [phone]

## 2023-04-25 ENCOUNTER — Observation Stay (HOSPITAL_COMMUNITY): Payer: 59

## 2023-04-25 ENCOUNTER — Encounter (HOSPITAL_COMMUNITY): Payer: Self-pay | Admitting: Internal Medicine

## 2023-04-25 DIAGNOSIS — I129 Hypertensive chronic kidney disease with stage 1 through stage 4 chronic kidney disease, or unspecified chronic kidney disease: Secondary | ICD-10-CM | POA: Diagnosis not present

## 2023-04-25 DIAGNOSIS — E1122 Type 2 diabetes mellitus with diabetic chronic kidney disease: Secondary | ICD-10-CM | POA: Diagnosis not present

## 2023-04-25 DIAGNOSIS — G8929 Other chronic pain: Secondary | ICD-10-CM | POA: Diagnosis not present

## 2023-04-25 DIAGNOSIS — F331 Major depressive disorder, recurrent, moderate: Secondary | ICD-10-CM | POA: Diagnosis not present

## 2023-04-25 DIAGNOSIS — Z794 Long term (current) use of insulin: Secondary | ICD-10-CM | POA: Diagnosis not present

## 2023-04-25 DIAGNOSIS — M545 Low back pain, unspecified: Secondary | ICD-10-CM | POA: Diagnosis present

## 2023-04-25 DIAGNOSIS — M5442 Lumbago with sciatica, left side: Secondary | ICD-10-CM | POA: Diagnosis not present

## 2023-04-25 LAB — GLUCOSE, CAPILLARY
Glucose-Capillary: 140 mg/dL — ABNORMAL HIGH (ref 70–99)
Glucose-Capillary: 88 mg/dL (ref 70–99)
Glucose-Capillary: 121 mg/dL — ABNORMAL HIGH (ref 70–99)
Glucose-Capillary: 84 mg/dL (ref 70–99)

## 2023-04-25 LAB — URINALYSIS, ROUTINE W REFLEX MICROSCOPIC
Bilirubin Urine: NEGATIVE
Glucose, UA: NEGATIVE mg/dL
Hgb urine dipstick: NEGATIVE
Ketones, ur: NEGATIVE mg/dL
Nitrite: NEGATIVE
Protein, ur: NEGATIVE mg/dL
Specific Gravity, Urine: 1.01 (ref 1.005–1.030)
pH: 5 (ref 5.0–8.0)

## 2023-04-25 LAB — BASIC METABOLIC PANEL
Anion gap: 10 (ref 5–15)
BUN: 34 mg/dL — ABNORMAL HIGH (ref 6–20)
CO2: 21 mmol/L — ABNORMAL LOW (ref 22–32)
Calcium: 9 mg/dL (ref 8.9–10.3)
Chloride: 108 mmol/L (ref 98–111)
Creatinine, Ser: 2.52 mg/dL — ABNORMAL HIGH (ref 0.44–1.00)
GFR, Estimated: 22 mL/min — ABNORMAL LOW (ref >60)
Glucose, Bld: 148 mg/dL — ABNORMAL HIGH (ref 70–99)
Potassium: 4.6 mmol/L (ref 3.5–5.1)
Sodium: 139 mmol/L (ref 135–145)

## 2023-04-25 LAB — ETHANOL: Alcohol, Ethyl (B): <10 mg/dL (ref <10)

## 2023-04-25 LAB — HCG, SERUM, QUALITATIVE: Preg, Serum: NEGATIVE

## 2023-04-25 LAB — RAPID URINE DRUG SCREEN, HOSP PERFORMED
Amphetamines: NOT DETECTED
Barbiturates: NOT DETECTED
Benzodiazepines: NOT DETECTED
Cocaine: NOT DETECTED
Opiates: NOT DETECTED
Tetrahydrocannabinol: POSITIVE — AB

## 2023-04-25 MED ORDER — HEPARIN SODIUM (PORCINE) 5000 UNIT/ML IJ SOLN
5000.0000 [IU] | Freq: Three times a day (TID) | INTRAMUSCULAR | Status: DC
  Administered 2023-04-25 – 2023-04-26 (×4): 5000 [IU] via SUBCUTANEOUS
  Filled 2023-04-25 (×4): qty 1

## 2023-04-25 MED ORDER — ORAL CARE MOUTH RINSE: 15.0000 mL | OROMUCOSAL | Status: DC | PRN

## 2023-04-25 MED ORDER — ACETAMINOPHEN 325 MG PO TABS: 650.0000 mg | ORAL_TABLET | Freq: Four times a day (QID) | ORAL | Status: DC | PRN

## 2023-04-25 MED ORDER — METOPROLOL SUCCINATE ER 50 MG PO TB24
50.0000 mg | ORAL_TABLET | Freq: Every day | ORAL | Status: DC
  Administered 2023-04-25 – 2023-04-26 (×2): 50 mg via ORAL
  Filled 2023-04-25 (×2): qty 1

## 2023-04-25 MED ORDER — QUETIAPINE FUMARATE 100 MG PO TABS
100.0000 mg | ORAL_TABLET | Freq: Every day | ORAL | Status: DC
  Administered 2023-04-25: 100 mg via ORAL
  Filled 2023-04-25: qty 1

## 2023-04-25 MED ORDER — ONDANSETRON HCL 4 MG/2ML IJ SOLN: 4.0000 mg | Freq: Four times a day (QID) | INTRAMUSCULAR | Status: DC | PRN

## 2023-04-25 MED ORDER — METHOCARBAMOL 500 MG PO TABS
500.0000 mg | ORAL_TABLET | Freq: Three times a day (TID) | ORAL | Status: DC
  Administered 2023-04-25 – 2023-04-26 (×4): 500 mg via ORAL
  Filled 2023-04-25 (×4): qty 1

## 2023-04-25 MED ORDER — HYDROXYZINE HCL 25 MG PO TABS
50.0000 mg | ORAL_TABLET | Freq: Three times a day (TID) | ORAL | Status: DC | PRN
  Administered 2023-04-25: 50 mg via ORAL
  Filled 2023-04-25: qty 2

## 2023-04-25 MED ORDER — INSULIN ASPART 100 UNIT/ML FLEXPEN: 10.0000 [IU] | PEN_INJECTOR | Freq: Three times a day (TID) | SUBCUTANEOUS | Status: DC

## 2023-04-25 MED ORDER — CALCIUM CARBONATE ANTACID 500 MG PO CHEW
2.0000 | CHEWABLE_TABLET | Freq: Three times a day (TID) | ORAL | Status: DC | PRN
  Administered 2023-04-25 – 2023-04-26 (×2): 400 mg via ORAL
  Filled 2023-04-25 (×2): qty 2

## 2023-04-25 MED ORDER — TRAZODONE HCL 50 MG PO TABS: 25.0000 mg | ORAL_TABLET | Freq: Every evening | ORAL | Status: DC | PRN

## 2023-04-25 MED ORDER — HYDROMORPHONE HCL 1 MG/ML IJ SOLN
0.5000 mg | Freq: Once | INTRAMUSCULAR | Status: AC
Start: 2023-04-25 — End: 2023-04-25
  Administered 2023-04-25: 0.5 mg via INTRAVENOUS
  Filled 2023-04-25: qty 1

## 2023-04-25 MED ORDER — LORAZEPAM 2 MG/ML IJ SOLN: 1.0000 mg | INTRAMUSCULAR | Status: DC | PRN

## 2023-04-25 MED ORDER — ISOSORB DINITRATE-HYDRALAZINE 20-37.5 MG PO TABS
1.0000 | ORAL_TABLET | Freq: Three times a day (TID) | ORAL | Status: DC
  Administered 2023-04-25 – 2023-04-26 (×4): 1 via ORAL
  Filled 2023-04-25 (×7): qty 1

## 2023-04-25 MED ORDER — INSULIN GLARGINE-YFGN 100 UNIT/ML ~~LOC~~ SOLN
45.0000 [IU] | Freq: Two times a day (BID) | SUBCUTANEOUS | Status: DC
  Administered 2023-04-25 – 2023-04-26 (×3): 45 [IU] via SUBCUTANEOUS
  Filled 2023-04-25 (×4): qty 0.45

## 2023-04-25 MED ORDER — LEVOTHYROXINE SODIUM 100 MCG PO TABS
300.0000 ug | ORAL_TABLET | Freq: Every day | ORAL | Status: DC
  Administered 2023-04-25 – 2023-04-26 (×2): 300 ug via ORAL
  Filled 2023-04-25 (×2): qty 3

## 2023-04-25 MED ORDER — FLUOXETINE HCL 20 MG PO CAPS
20.0000 mg | ORAL_CAPSULE | Freq: Every day | ORAL | Status: DC
  Administered 2023-04-25 – 2023-04-26 (×2): 20 mg via ORAL
  Filled 2023-04-25 (×2): qty 1

## 2023-04-25 MED ORDER — GABAPENTIN 100 MG PO CAPS
100.0000 mg | ORAL_CAPSULE | Freq: Three times a day (TID) | ORAL | Status: DC
Start: 2023-04-25 — End: 2023-04-26
  Administered 2023-04-25 – 2023-04-26 (×4): 100 mg via ORAL
  Filled 2023-04-25 (×4): qty 1

## 2023-04-25 MED ORDER — INSULIN ASPART 100 UNIT/ML IJ SOLN
10.0000 [IU] | Freq: Three times a day (TID) | INTRAMUSCULAR | Status: DC
Start: 2023-04-25 — End: 2023-04-26
  Administered 2023-04-25 – 2023-04-26 (×3): 10 [IU] via SUBCUTANEOUS
  Filled 2023-04-25: qty 0.1

## 2023-04-25 MED ORDER — ACETAMINOPHEN 650 MG RE SUPP: 650.0000 mg | Freq: Four times a day (QID) | RECTAL | Status: DC | PRN

## 2023-04-25 MED ORDER — INSULIN ASPART 100 UNIT/ML IJ SOLN
0.0000 [IU] | Freq: Every day | INTRAMUSCULAR | Status: DC
  Filled 2023-04-25: qty 0.05

## 2023-04-25 MED ORDER — MIRTAZAPINE 30 MG PO TABS: 45.0000 mg | ORAL_TABLET | Freq: Every day | ORAL | Status: DC

## 2023-04-25 MED ORDER — OXYCODONE HCL 5 MG PO TABS
10.0000 mg | ORAL_TABLET | ORAL | Status: DC | PRN
  Administered 2023-04-25 – 2023-04-26 (×3): 10 mg via ORAL
  Filled 2023-04-25 (×3): qty 2

## 2023-04-25 MED ORDER — ONDANSETRON HCL 4 MG PO TABS: 4.0000 mg | ORAL_TABLET | Freq: Four times a day (QID) | ORAL | Status: DC | PRN

## 2023-04-25 MED ORDER — OXYCODONE HCL 5 MG PO TABS
5.0000 mg | ORAL_TABLET | ORAL | Status: DC | PRN
  Administered 2023-04-25: 5 mg via ORAL
  Filled 2023-04-25: qty 1

## 2023-04-25 MED ORDER — FLUTICASONE PROPIONATE 50 MCG/ACT NA SUSP
1.0000 | Freq: Every day | NASAL | Status: DC
  Administered 2023-04-25 – 2023-04-26 (×2): 1 via NASAL
  Filled 2023-04-25: qty 16

## 2023-04-25 MED ORDER — INSULIN ASPART 100 UNIT/ML IJ SOLN
0.0000 [IU] | Freq: Three times a day (TID) | INTRAMUSCULAR | Status: DC
  Administered 2023-04-25 – 2023-04-26 (×3): 3 [IU] via SUBCUTANEOUS
  Filled 2023-04-25: qty 0.2

## 2023-04-25 MED ORDER — SODIUM CHLORIDE 0.9 % IV SOLN: INTRAVENOUS | Status: AC

## 2023-04-25 MED ORDER — ENOXAPARIN SODIUM 40 MG/0.4ML IJ SOSY
40.0000 mg | PREFILLED_SYRINGE | INTRAMUSCULAR | Status: DC
Start: 2023-04-25 — End: 2023-04-25

## 2023-04-25 MED ORDER — MIRTAZAPINE 30 MG PO TABS
30.0000 mg | ORAL_TABLET | Freq: Every day | ORAL | Status: DC
  Administered 2023-04-25: 30 mg via ORAL
  Filled 2023-04-25: qty 1

## 2023-04-25 MED ORDER — ALBUTEROL SULFATE (2.5 MG/3ML) 0.083% IN NEBU: 2.5000 mg | INHALATION_SOLUTION | RESPIRATORY_TRACT | Status: DC | PRN

## 2023-04-25 MED ORDER — LORAZEPAM 1 MG PO TABS
1.0000 mg | ORAL_TABLET | ORAL | Status: DC | PRN
  Administered 2023-04-25: 1 mg via ORAL
  Filled 2023-04-25: qty 1

## 2023-04-25 MED ORDER — HYDROMORPHONE HCL 1 MG/ML IJ SOLN
0.5000 mg | Freq: Once | INTRAMUSCULAR | Status: AC
  Administered 2023-04-25: 0.5 mg via INTRAVENOUS
  Filled 2023-04-25: qty 1

## 2023-04-25 NOTE — Evaluation (Signed)
Physical Therapy Evaluation Patient Details Name: [name] MRN: [id number] DOB: 10/23/1970 Today's Date: 04/25/2023  History of Present Illness  [name] is a 53 y.o. female admitted to the hospital with acute on chronic renal failure in the setting of worsening sciatica. PMH: obesity, insulin-dependent type 2 diabetes, depression, anxiety, hypertension, CKD stage III  Clinical Impression  Patient ambulated with RW and supervision, had recently ambulated with nursing,  patient labile while providing  history of   workup for the  sciatica, . Patient has OPPT appointment coming up, encouraged patient to keep the appointment. Encouraged patient to continue to maintain mobility , using RW.       If plan is discharge home, recommend the following: Assistance with cooking/housework;Assist for transportation;Help with stairs or ramp for entrance   Can travel by private vehicle        Equipment Recommendations None recommended by PT  Recommendations for Other Services       Functional Status Assessment       Precautions / Restrictions Precautions Precautions: Fall Restrictions Weight Bearing Restrictions Per Provider Order: No      Mobility  Bed Mobility Overal bed mobility: Independent                  Transfers Overall transfer level: Modified independent                      Ambulation/Gait Ambulation/Gait assistance: Supervision Gait Distance (Feet): 80 Feet Assistive device: Rolling walker (2 wheels) Gait Pattern/deviations: Step-through pattern       General Gait Details: gait is slow  Stairs            Wheelchair Mobility     Tilt Bed    Modified Rankin (Stroke Patients Only)       Balance Overall balance assessment: Mild deficits observed, not formally tested                                           Pertinent Vitals/Pain Pain Assessment Pain Assessment: Faces Pain Score: 7  Pain Location: left  posterior thigh Pain Descriptors / Indicators: Aching, Crying, Discomfort, Burning Pain Intervention(s): Limited activity within patient's tolerance, Monitored during session, Premedicated before session    Home Living Family/patient expects to be discharged to:: Private residence Living Arrangements: Spouse/significant other;Children Available Help at Discharge: Family;Available 24 hours/day Type of Home: House Home Access: Level entry     Alternate Level Stairs-Number of Steps: 7 between Home Layout: Multi-level Home Equipment: Architectural technologist (4 wheels);Wheelchair - manual Additional Comments: has not worked since last year, TA with special ed    Prior Function Prior Level of Function : Needs assist;History of Falls (last six months)             Mobility Comments: uses rollator, WC when out ADLs Comments: states showeris debilitating, cannot stand to cook, does try to vacuum     Extremity/Trunk Assessment   Upper Extremity Assessment Upper Extremity Assessment: Overall WFL for tasks assessed    Lower Extremity Assessment Lower Extremity Assessment: Overall WFL for tasks assessed (reports some decreased sensation left leg)    Cervical / Trunk Assessment Cervical / Trunk Assessment: Normal  Communication   Communication Communication: No apparent difficulties    Cognition Arousal: Alert Behavior During Therapy: WFL for tasks assessed/performed   PT - Cognitive impairments: No apparent  impairments                       PT - Cognition Comments: at times euphoric to labile, repeatrs  symptoms multiple times and  the physicians and tests in the past. Following commands: Intact       Cueing       General Comments      Exercises     Assessment/Plan    PT Assessment Patient needs continued PT services  PT Problem List Decreased strength;Pain;Decreased activity tolerance;Decreased mobility       PT Treatment Interventions DME  instruction;Functional mobility training;Gait training;Therapeutic activities;Patient/family education    PT Goals (Current goals can be found in the Care Plan section)  Acute Rehab PT Goals Patient Stated Goal: not be in pain PT Goal Formulation: With patient Time For Goal Achievement: 05/09/23 Potential to Achieve Goals: Good    Frequency Min 1X/week     Co-evaluation               AM-PAC PT "6 Clicks" Mobility  Outcome Measure Help needed turning from your back to your side while in a flat bed without using bedrails?: None Help needed moving from lying on your back to sitting on the side of a flat bed without using bedrails?: None Help needed moving to and from a bed to a chair (including a wheelchair)?: A Little Help needed standing up from a chair using your arms (e.g., wheelchair or bedside chair)?: A Little Help needed to walk in hospital room?: A Little Help needed climbing 3-5 steps with a railing? : A Lot 6 Click Score: 19    End of Session Equipment Utilized During Treatment: Gait belt Activity Tolerance: Patient tolerated treatment well;Patient limited by pain Patient left: in bed;with nursing/sitter in room Nurse Communication: Mobility status PT Visit Diagnosis: Unsteadiness on feet (R26.81);History of falling (Z91.81)    Time: 0454-0981 PT Time Calculation (min) (ACUTE ONLY): 23 min   Charges:   PT Evaluation $PT Eval Low Complexity: 1 Low PT Treatments $Gait Training: 8-22 mins PT General Charges $$ ACUTE PT VISIT: 1 Visit         Blanchard [facility] PT Acute Rehabilitation Services Office [phone] Weekend pager-[phone]   Rada Hay 04/25/2023, 4:25 PM

## 2023-04-25 NOTE — Plan of Care (Signed)
Plan of care reviewed. Pt is progressing. Vital signs stable, afebrile, no distress and able to rest well. Pain is well tolerated. Continue 1:1 Recruitment consultant.  Problem: Health Behavior/Discharge Planning: Goal: Ability to identify and utilize available resources and services will improve Outcome: Progressing Goal: Ability to manage health-related needs will improve Outcome: Progressing   Problem: Metabolic: Goal: Ability to maintain appropriate glucose levels will improve Outcome: Progressing   Problem: Nutritional: Goal: Maintenance of adequate nutrition will improve Outcome: Progressing Goal: Progress toward achieving an optimal weight will improve Outcome: Progressing   Problem: Coping: Goal: Ability to adjust to condition or change in health will improve Outcome: Progressing   Problem: Pain Managment: Goal: General experience of comfort will improve and/or be controlled Outcome: Progressing   Problem: Safety: Goal: Ability to remain free from injury will improve Outcome: Progressing   Filiberto Pinks, RN

## 2023-04-25 NOTE — Consult Note (Addendum)
[facility] Psychiatric Consult Initial  Patient Name: .[name]  MRN: [id number]  DOB: 04-Nov-1970  Consult Order details:  Orders (From admission, onward)     Start     Ordered   04/25/23 0801  IP CONSULT TO PSYCHIATRY       Ordering Provider: Maryln Gottron, MD  Provider:  (Not yet assigned)  Question Answer Comment  Location [facility]   Reason for Consult? Very depressed 2/2 worsening back pain, passive suicidal thoughts.      04/25/23 0801             Mode of Visit: In person    Psychiatry Consult Evaluation  Service Date: April 25, 2023 LOS:  LOS: 0 days  Chief Complaint Left-sided sciatica   Primary Psychiatric Diagnoses  Major Depressive Disorder, recurrent, severe, without psychosis  Anxiety   Assessment  [name] is a 53 y.o. female admitted: Medicallyfor 04/24/2023 10:52 PM for back pain and leg pain. She carries the psychiatric diagnoses of major depressive disorder and has a past medical history of  obesity, diabetes mellitus, acute renal failure and sciatica.   Her current presentation of emotional instability, crying and refusing to talk to the provider on the second visit to allow her nurse to finish medications and per the chart of her complaining since admission of being "very depressed" as she can no longer work or drive because of her back pain (surgery is pending based on her weight loss per outpatient chart note) is most consistent with MDD. Current outpatient psychotropic medications include Quetiapine, Mirtazapine, Hydroxyzine and Gabapentin and historically she has had a moderately positive response to these medications. She was compliant with medications prior to admission as evidenced by past notes. On initial examination, patient was pleasant and smiling.  When this provider returned to allow time for her nurse to give medications who was at her bedside, she refused to talk with the team of two, turned her back and  started crying that she did not want to see [name], politely left the room. Please see plan below for detailed recommendations.   Diagnoses:  Active Hospital problems: Principal Problem:   Lumbago syndrome    Plan   ## Psychiatric Medication Recommendations:  -Increased Prozac 10 mg daily to 20 mg daily -Decrease Remeron 45 mg daily at bedtime to 30 mg with goal to discontinue to assist in preventing weight -Continue Seroquel 100 mg at bedtime -Continue gabapentin 100 mg TID -Continue hydroxyzine 50 mg TID PRN -Continue Trazodone 25 mg at bedtime PRN  ## Medical Decision Making Capacity: Not specifically addressed in this encounter  ## Further Work-up:  -- EKG -- most recent EKG on 10/22/2022 had QtC of 479 --Ordered with results on 2/15 with QTc of 479 -- Pertinent labwork reviewed earlier this admission includes: Urine Tox, UA, CBC, BMP   ## Disposition:-- Disposition pending when she is agreeable for an assessment  ## Behavioral / Environmental: - No specific recommendations at this time.     ## Safety and Observation Level:  - Based on my clinical evaluation, I estimate the patient to be at low risk of self harm in the current setting. - At this time, we recommend  routine. This decision is based on my review of the chart including patient's history and current presentation, interview of the patient, mental status examination, and consideration of suicide risk including evaluating suicidal ideation, plan, intent, suicidal or self-harm behaviors, risk factors, and protective factors. This judgment  is based on our ability to directly address suicide risk, implement suicide prevention strategies, and develop a safety plan while the patient is in the clinical setting. Please contact our team if there is a concern that risk level has changed.  CSSR Risk Category:C-SSRS RISK CATEGORY: No Risk  Suicide Risk Assessment: Patient has following modifiable risk factors for suicide: under  treated depression , which we are addressing by increasing her Prozac. Patient has following non-modifiable or demographic risk factors for suicide: none Patient has the following protective factors against suicide: Access to outpatient mental health care, no history of suicide attempts, and no history of NSSIB  Thank you for this consult request. Recommendations have been communicated to the primary team.  We will attempt to see her tomorrow, client adamantly refused today.   Nanine Means, NP       History of Present Illness  Relevant Aspects of [facility] Course:  Admitted on 04/24/2023 for left-sided sciatica.   Patient Report:  This provider and a PMHNP student first attempted to see her, she was smiling and spoke briefly in introduction. The RN at the bedside was giving medications while finishing her admission.  This team returned in 15 minutes after the nurse reported needing a couple of minutes.  Upon re-entering the room, the nurse at her bedside stated she did not want to talk to [name] and the client yelled, "No" and turned her back to the team while she became emotional.  Politely left the room and will try to see the client again tomorrow.  Psych ROS:  Depression: high per notes Anxiety:  unable to assess, client refused the assessment Mania (lifetime and current): UTA, none per notes Psychosis: (lifetime and current): UTA, none per notes   Review of Systems  Constitutional: Negative.   HENT: Negative.    Eyes: Negative.   Respiratory: Negative.    Cardiovascular: Negative.   Gastrointestinal: Negative.   Genitourinary: Negative.   Musculoskeletal:  Positive for back pain.  Skin: Negative.   Neurological:  Positive for weakness.  Endo/Heme/Allergies: Negative.   Psychiatric/Behavioral:  Positive for depression. The patient is nervous/anxious.      Psychiatric and Social History  Psychiatric History:  Information collected from chart notes  Prev Dx/Sx: MDD,  anxiety Current Psych Provider: Gretchen Short, PMHNP Home Meds (current): Seroquel, Remeron, Prozac, gabapentin Previous Med Trials: unknown Therapy: [facility]  Prior Psych Hospitalization: none per chart  Prior Self Harm: none per notes Prior Violence: none  Family Psych History: unknown Family Hx suicide: unknown  Social History:  Occupational Hx: unable to work recently due to back pain Legal Hx: UTA Living Situation: UTA Spiritual Hx: UTA Access to weapons/lethal means: UTA  Substance History Alcohol: UTA  Tobacco: UTA Illicit drugs: UTA Prescription drug abuse: UTA Rehab hx: UTA  Exam Findings  Physical Exam: sciatica issues Vital Signs:  Temp:  [97.8 F (36.6 C)-98.3 F (36.8 C)] 98.2 F (36.8 C) (02/15 0415) Pulse Rate:  [76-98] 86 (02/15 0730) Resp:  [14-18] 16 (02/15 0730) BP: (101-127)/(59-83) 111/75 (02/15 0730) SpO2:  [95 %-100 %] 99 % (02/15 0730) Weight:  [136.1 kg] 136.1 kg (02/14 2302) Blood pressure 111/75, pulse 86, temperature 98.2 F (36.8 C), resp. rate 16, height 5\' 11"  (1.803 m), weight 136.1 kg, SpO2 99%. Body mass index is 41.84 kg/m.  Physical Exam Vitals and nursing note reviewed.  Constitutional:      Appearance: Normal appearance.  HENT:     Head: Normocephalic.  Nose: Nose normal.  Pulmonary:     Effort: Pulmonary effort is normal.  Musculoskeletal:     Cervical back: Normal range of motion.  Neurological:     General: No focal deficit present.     Mental Status: She is alert and oriented to person, place, and time.     Mental Status Exam: General Appearance: Casual  Orientation:  UTA  Memory:  UTA  Concentration:  UTA  Recall:  UTA  Attention  UTA  Eye Contact:  Good  Speech:  Normal Rate  Language:  Good  Volume:  increased on second visit  Mood: depressed per notes  Affect:  Appropriate  Thought Process:  UTA  Thought Content:  UTA  Suicidal Thoughts:  UTA  Homicidal Thoughts:  UTA  Judgement:  UTA   Insight:  UTA  Psychomotor Activity:  Decreased  Akathisia:  UTA  Fund of Knowledge:  UTA      Assets:  Leisure Time Resilience Social Support  Cognition:  WNL  ADL's:  UTA  AIMS (if indicated):        Other History   These have been pulled in through the EMR, reviewed, and updated if appropriate.  Family History:  The patient's family history includes Asthma in her father.  Medical History: Past Medical History:  Diagnosis Date   Acute renal failure (HCC) 05/27/10   hemodialysis for 6 weeks   Anxiety    Asthma    Depression    Diabetes mellitus    Hypertension    Rhabdomyolysis 06/06/10   after drug overdose   Suicide attempt by drug ingestion (HCC) 06/05/10   result rhabdomyolosis and ARF requrining dialysis     Surgical History: Past Surgical History:  Procedure Laterality Date   ANKLE ARTHROSCOPY Right 08/17/2015   Procedure: RIGHT ANKLE ARTHROSCOPY WITH SYNOVECTOMY AND LOOSE BODY EXCISION;  Surgeon: Marcene Corning, MD;  Location: [facility] OR;  Service: Orthopedics;  Laterality: Right;   CHOLECYSTECTOMY     TUBAL LIGATION  1998     Medications:   Current Facility-Administered Medications:    0.9 %  sodium chloride infusion, , Intravenous, Continuous, Kirby Crigler, Mir M, MD   acetaminophen (TYLENOL) tablet 650 mg, 650 mg, Oral, Q6H PRN **OR** acetaminophen (TYLENOL) suppository 650 mg, 650 mg, Rectal, Q6H PRN, Kirby Crigler, Mir M, MD   albuterol (PROVENTIL) (2.5 MG/3ML) 0.083% nebulizer solution 2.5 mg, 2.5 mg, Nebulization, Q2H PRN, Kirby Crigler, Mir M, MD   fluticasone (FLONASE) 50 MCG/ACT nasal spray 1 spray, 1 spray, Each Nare, Daily, Kirby Crigler, Mir M, MD   gabapentin (NEURONTIN) capsule 100 mg, 100 mg, Oral, TID, Kirby Crigler, Mir M, MD   heparin injection 5,000 Units, 5,000 Units, Subcutaneous, Q8H, Kirby Crigler, Mir M, MD   hydrOXYzine (ATARAX) tablet 50 mg, 50 mg, Oral, TID PRN, Kirby Crigler, Mir M, MD   insulin aspart (novoLOG) injection 0-20 Units, 0-20 Units,  Subcutaneous, TID WC, Ikramullah, Mir M, MD   insulin aspart (novoLOG) injection 0-5 Units, 0-5 Units, Subcutaneous, QHS, Ikramullah, Mir M, MD   insulin aspart (novoLOG) injection 10 Units, 10 Units, Subcutaneous, TID WC, Kirby Crigler, Mir M, MD   insulin glargine-yfgn (SEMGLEE) injection 45 Units, 45 Units, Subcutaneous, BID, Kirby Crigler, Mir M, MD   isosorbide-hydrALAZINE (BIDIL) 20-37.5 MG per tablet 1 tablet, 1 tablet, Oral, TID, Kirby Crigler, Mir M, MD   levothyroxine (SYNTHROID) tablet 300 mcg, 300 mcg, Oral, Q0600, Kirby Crigler, Mir M, MD   metoprolol succinate (TOPROL-XL) 24 hr tablet 50 mg, 50 mg, Oral, Daily, Kirby Crigler, Mir M, MD  mirtazapine (REMERON) tablet 45 mg, 45 mg, Oral, QHS, Kirby Crigler, Mir M, MD   ondansetron (ZOFRAN) tablet 4 mg, 4 mg, Oral, Q6H PRN **OR** ondansetron (ZOFRAN) injection 4 mg, 4 mg, Intravenous, Q6H PRN, Kirby Crigler, Mir M, MD   oxyCODONE (Oxy IR/ROXICODONE) immediate release tablet 5 mg, 5 mg, Oral, Q4H PRN, Kirby Crigler, Mir M, MD   QUEtiapine (SEROQUEL) tablet 100 mg, 100 mg, Oral, QHS, Kirby Crigler, Mir M, MD   traZODone (DESYREL) tablet 25 mg, 25 mg, Oral, QHS PRN, Kirby Crigler, Mir M, MD  Allergies: Allergies  Allergen Reactions   Ace Inhibitors Other (See Comments)    Patient had acute renal failure requiring hemodialysis after a suicide attempt.  Nephro recommended avoiding use.   Haldol [Haloperidol Lactate] Other (See Comments)    Tardive dyskinesia   Nsaids Other (See Comments)    Nephro recommended avoiding after acute renal failure requiring hemodialysis associated with a suicide attempt.   Entresto [Sacubitril-Valsartan] Itching   Latex Other (See Comments)    Rash around IV site    Nanine Means, NP

## 2023-04-25 NOTE — H&P (Addendum)
History and Physical  RALPHINE HINKS ZOX:[id number] DOB: 1970-06-11 DOA: 04/24/2023  PCP: Elberta Fortis, MD   Chief Complaint: Left-sided sciatica  HPI: [name] is a 53 y.o. female with medical history significant for obesity, insulin-dependent type 2 diabetes, depression, anxiety, hypertension, CKD stage III admitted to the hospital with acute on chronic renal failure in the setting of worsening sciatica.  She has had worsening progressive sciatica for almost the last year, pain is quite severe and debilitating, to the point that she is unable to work any longer, unable to drive, and her quality of life has been significantly impacted.  She is clearly quite depressed.  She feels that she is not being heard, she has been to neurosurgery, pain management clinic in addition to her primary family practice doctor and feels that nobody has been able to help.  She has seen Dr. Franky Macho of neurosurgery who is willing to provide her with some surgical repair however she was told there is no guarantee that it would help.  She has a prior history of suicide attempt with pills over a decade ago, patient mentions to me multiple times that although she does not want to die, she has considered ending her life due to the severity of her pain.  She denies any fevers, chills, in addition to her left lower extremity sciatica she also has burning numbness and cold sensation in her bilateral feet.  She denies any saddle anesthesia, urinary or stool incontinence.  Review of Systems: Please see HPI for pertinent positives and negatives. A complete 10 system review of systems are otherwise negative.  Past Medical History:  Diagnosis Date   Acute renal failure (HCC) 05/27/10   hemodialysis for 6 weeks   Anxiety    Asthma    Depression    Diabetes mellitus    Hypertension    Rhabdomyolysis 06/06/10   after drug overdose   Suicide attempt by drug ingestion (HCC) 06/05/10   result rhabdomyolosis and ARF  requrining dialysis    Past Surgical History:  Procedure Laterality Date   ANKLE ARTHROSCOPY Right 08/17/2015   Procedure: RIGHT ANKLE ARTHROSCOPY WITH SYNOVECTOMY AND LOOSE BODY EXCISION;  Surgeon: Marcene Corning, MD;  Location: [facility] OR;  Service: Orthopedics;  Laterality: Right;   CHOLECYSTECTOMY     TUBAL LIGATION  1998   Social History:  reports that she has never smoked. She has never been exposed to tobacco smoke. She has never used smokeless tobacco. She reports that she does not drink alcohol and does not use drugs.  Allergies  Allergen Reactions   Ace Inhibitors Other (See Comments)    Patient had acute renal failure requiring hemodialysis after a suicide attempt.  Nephro recommended avoiding use.   Haldol [Haloperidol Lactate] Other (See Comments)    Tardive dyskinesia   Nsaids Other (See Comments)    Nephro recommended avoiding after acute renal failure requiring hemodialysis associated with a suicide attempt.   Entresto [Sacubitril-Valsartan] Itching   Latex Other (See Comments)    Rash around IV site    Family History  Problem Relation Age of Onset   Asthma Father      Prior to Admission medications   Medication Sig Start Date End Date Taking? Authorizing Provider  albuterol (VENTOLIN HFA) 108 (90 Base) MCG/ACT inhaler Inhale 2 puffs into the lungs every 6 (six) hours as needed for shortness of breath. 09/23/22  Yes Elberta Fortis, MD  fluticasone [facility]) 50 MCG/ACT nasal spray Place 1 spray into both nostrils  daily. 1 spray in each nostril every day 06/10/21  Yes Espinoza, Alejandra, DO  fluticasone-salmeterol (WIXELA INHUB) 250-50 MCG/ACT AEPB Inhale 1 puff into the lungs in the morning and at bedtime. Patient taking differently: Inhale 1 puff into the lungs 2 (two) times daily as needed. 09/23/22  Yes Elberta Fortis, MD  furosemide (LASIX) 40 MG tablet Take 1 tablet by mouth once daily 02/21/23  Yes Elberta Fortis, MD  hydrOXYzine (ATARAX) 50 MG tablet Take 1  tablet (50 mg total) by mouth 3 (three) times daily as needed for anxiety. 04/23/23  Yes Toy Cookey E, NP  insulin glargine (LANTUS) 100 UNIT/ML Solostar Pen Inject 65 Units into the skin 2 (two) times daily. 02/27/23  Yes Elberta Fortis, MD  isosorbide-hydrALAZINE (BIDIL) 20-37.5 MG tablet Take 1 tablet by mouth 3 (three) times daily. 03/12/23  Yes Elberta Fortis, MD  levothyroxine (SYNTHROID) 300 MCG tablet Take 1 tablet (300 mcg total) by mouth daily at 6 (six) AM. 04/22/22  Yes Sabino Dick, DO  LORazepam (ATIVAN) 1 MG tablet Take 1 tablet by mouth twice daily as needed for anxiety 03/20/23  Yes Elberta Fortis, MD  methocarbamol (ROBAXIN) 500 MG tablet Take 2 tablets (1,000 mg total) by mouth 3 (three) times daily. 03/16/23  Yes Elberta Fortis, MD  metoprolol succinate (TOPROL-XL) 50 MG 24 hr tablet Take 1 tablet (50 mg total) by mouth at bedtime. Take with or immediately following a meal. Patient taking differently: Take 50 mg by mouth daily. Take with or immediately following a meal. 03/12/23  Yes Elberta Fortis, MD  mirtazapine (REMERON) 45 MG tablet Take 1 tablet (45 mg total) by mouth at bedtime. 04/23/23  Yes Toy Cookey E, NP  NOVOLOG FLEXPEN 100 UNIT/ML FlexPen INJECT 15 UNITS SUBCUTANEOUSLY WITH LUNCH AND INJECT 20 UNITS WITH SUPPER 02/20/23  Yes Elberta Fortis, MD  QUEtiapine (SEROQUEL) 100 MG tablet Take 1 tablet (100 mg total) by mouth at bedtime. 04/23/23  Yes Toy Cookey E, NP  tirzepatide [facility]) 5 MG/0.5ML Pen Inject 5 mg into the skin once a week. 03/12/23  Yes Elberta Fortis, MD  blood glucose meter kit and supplies KIT Dispense based on patient and insurance preference. Use up to four times daily as directed. 10/01/21   Sabino Dick, DO  FLUoxetine (PROZAC) 10 MG capsule Take 1 capsule (10 mg total) by mouth daily. 04/23/23   Shanna Cisco, NP  gabapentin (NEURONTIN) 300 MG capsule Take 1 capsule (300 mg total) by mouth 3 (three) times  daily. 04/23/23   Shanna Cisco, NP  Insulin Pen Needle 32G X 4 MM MISC 1 each by Does not apply route 3 (three) times daily. 04/23/22   Sabino Dick, DO  losartan (COZAAR) 25 MG tablet Take 1 tablet (25 mg total) by mouth at bedtime. Patient not taking: Reported on 04/25/2023 09/12/22   Elberta Fortis, MD  oxyCODONE-acetaminophen (PERCOCET) 5-325 MG tablet Take 1 tablet by mouth every 12 (twelve) hours as needed for severe pain. Patient not taking: Reported on 04/25/2023 10/17/22   Elberta Fortis, MD  rosuvastatin (CRESTOR) 20 MG tablet Take 1 tablet (20 mg total) by mouth daily. Patient not taking: Reported on 04/25/2023 02/26/22   Clapacs, Jackquline Denmark, MD  ipratropium-albuterol (DUONEB) 0.5-2.5 (3) MG/3ML SOLN Take 3 mLs by nebulization 3 (three) times daily as needed. Patient taking differently: Take 3 mLs by nebulization 3 (three) times daily as needed (sob/wheezing). 10/12/20 05/21/21  Fayrene Helper, PA-C    Physical Exam: BP 111/75 (BP Location: Right Arm)  Pulse 86   Temp 98.2 F (36.8 C)   Resp 16   Ht 5\' 11"  (1.803 m)   Wt 136.1 kg   SpO2 99%   BMI 41.84 kg/m  General: Alert, oriented, appears her stated age.  Pleasant and cooperative, but at times tearful and anxious when discussing her back pain in the affected has had.  Looks nontoxic and relatively comfortable. Cardiovascular: RRR, no murmurs or rubs, no peripheral edema  Respiratory: clear to auscultation bilaterally, no wheezes, no crackles  Abdomen: soft, nontender, nondistended, normal bowel tones heard  Skin: dry, no rashes  Musculoskeletal: no joint effusions, lower extremity range of motion severely limited by pain Psychiatric: appropriate affect, normal speech, quite anxious Neurologic: extraocular muscles intact, clear speech, moving all extremities with intact sensorium         Labs on Admission:  Basic Metabolic Panel: Recent Labs  Lab 04/24/23 2324  NA 139  K 4.6  CL 108  CO2 21*  GLUCOSE 148*  BUN  34*  CREATININE 2.52*  CALCIUM 9.0   Liver Function Tests: No results for input(s): "AST", "ALT", "ALKPHOS", "BILITOT", "PROT", "ALBUMIN" in the last 168 hours. No results for input(s): "LIPASE", "AMYLASE" in the last 168 hours. No results for input(s): "AMMONIA" in the last 168 hours. CBC: Recent Labs  Lab 04/24/23 2324  WBC 7.9  HGB 11.7*  HCT 37.4  MCV 86.4  PLT 521*   Cardiac Enzymes: No results for input(s): "CKTOTAL", "CKMB", "CKMBINDEX", "TROPONINI" in the last 168 hours. BNP (last 3 results) Recent Labs    07/12/22 1938 10/21/22 2216  BNP 29.7 125.1*    ProBNP (last 3 results) No results for input(s): "PROBNP" in the last 8760 hours.  CBG: No results for input(s): "GLUCAP" in the last 168 hours.  Radiological Exams on Admission: No results found. Assessment/Plan NINAH MOCCIO is a 53 y.o. female with medical history significant for obesity, insulin-dependent type 2 diabetes, depression, anxiety, hypertension, CKD stage III admitted to the hospital with acute on chronic renal failure in the setting of worsening sciatica.  AKI on CKD stage III-unclear etiology, possibly due to reduced oral intake and relative dehydration from her worsening sciatica.  No fevers, chills, dysuria or other evidence of acute infection.  Was mildly hypotensive in the emergency department so this may be playing a role. -Observation admission -Avoid nephrotoxins -Hydrate gently with normal saline at 100 cc/h -Recheck renal function in the morning  Sciatica-she has severe worsening left-sided sciatica, this is a known problem for her.  Unfortunately she has tried different medication modalities, as well as physical therapy and has been seen by neurosurgery. -PT evaluation -Will check lumbar spine x-ray to rule out any new pathology -Oxycodone 5 mg p.o. every 4 hours as needed -Encouraged her to follow-up with neurosurgery as previously planned  Suicidal ideation, depression and  anxiety-patient with passive thoughts of suicide, though has no particular plan.  Denies feeling suicidal at this moment.  However she does have a history of prior suicide attempt in 2012. -Continue bedtime Seroquel and Remeron -Atarax 50 mg p.o. 3 times daily as needed anxiety -Inpatient psychiatry evaluation  Type 2 diabetes-not well-controlled, will continue home basal bolus insulin at reduced dose and can titrate as necessary depending on her p.o. intake -Semglee 45 units twice daily -NovoLog 10 units 3 times daily with meals -Moderate dose sliding scale  Peripheral neuropathy-patient describes numbness tingling and cold sensation in bilateral feet, I suspect this is neuropathy related to her poorly  controlled diabetes -Start gabapentin 100 mg p.o. 3 times daily (due to AKI)  Hypertension-continue Toprol-XL, BiDil  Hypothyroidism-Synthroid  DVT prophylaxis: Subcutaneous heparin    Code Status: Full Code  Consults called: Psychiatry  Admission status: Observation  Time spent: 49 minutes  Mackinze Criado Sharlette Dense MD [facility] Pager [phone]  If 7PM-7AM, please contact night-coverage [email or web address] Password [email or web address]  04/25/2023, 8:08 AM

## 2023-04-26 ENCOUNTER — Encounter (HOSPITAL_COMMUNITY): Payer: Self-pay | Admitting: Internal Medicine

## 2023-04-26 DIAGNOSIS — N179 Acute kidney failure, unspecified: Secondary | ICD-10-CM | POA: Diagnosis not present

## 2023-04-26 DIAGNOSIS — M5442 Lumbago with sciatica, left side: Secondary | ICD-10-CM | POA: Diagnosis not present

## 2023-04-26 DIAGNOSIS — F332 Major depressive disorder, recurrent severe without psychotic features: Secondary | ICD-10-CM

## 2023-04-26 DIAGNOSIS — Z794 Long term (current) use of insulin: Secondary | ICD-10-CM

## 2023-04-26 DIAGNOSIS — E039 Hypothyroidism, unspecified: Secondary | ICD-10-CM | POA: Diagnosis not present

## 2023-04-26 DIAGNOSIS — F331 Major depressive disorder, recurrent, moderate: Secondary | ICD-10-CM

## 2023-04-26 DIAGNOSIS — G8929 Other chronic pain: Secondary | ICD-10-CM

## 2023-04-26 DIAGNOSIS — I10 Essential (primary) hypertension: Secondary | ICD-10-CM

## 2023-04-26 DIAGNOSIS — N1832 Chronic kidney disease, stage 3b: Secondary | ICD-10-CM

## 2023-04-26 DIAGNOSIS — E1122 Type 2 diabetes mellitus with diabetic chronic kidney disease: Secondary | ICD-10-CM

## 2023-04-26 LAB — GLUCOSE, CAPILLARY
Glucose-Capillary: 127 mg/dL — ABNORMAL HIGH (ref 70–99)
Glucose-Capillary: 103 mg/dL — ABNORMAL HIGH (ref 70–99)

## 2023-04-26 LAB — BASIC METABOLIC PANEL
Anion gap: 10 (ref 5–15)
BUN: 38 mg/dL — ABNORMAL HIGH (ref 6–20)
CO2: 22 mmol/L (ref 22–32)
Calcium: 9.2 mg/dL (ref 8.9–10.3)
Chloride: 108 mmol/L (ref 98–111)
Creatinine, Ser: 2.15 mg/dL — ABNORMAL HIGH (ref 0.44–1.00)
GFR, Estimated: 27 mL/min — ABNORMAL LOW (ref >60)
Glucose, Bld: 151 mg/dL — ABNORMAL HIGH (ref 70–99)
Potassium: 4.4 mmol/L (ref 3.5–5.1)
Sodium: 140 mmol/L (ref 135–145)

## 2023-04-26 MED ORDER — DIAZEPAM 2 MG PO TABS: 2.0000 mg | ORAL_TABLET | Freq: Four times a day (QID) | ORAL | Status: DC | PRN

## 2023-04-26 MED ORDER — FLUOXETINE HCL 20 MG PO CAPS: 20.0000 mg | ORAL_CAPSULE | Freq: Every day | ORAL | 0 refills | Status: DC

## 2023-04-26 MED ORDER — OXYCODONE HCL 10 MG PO TABS: 10.0000 mg | ORAL_TABLET | ORAL | 0 refills | Status: AC | PRN

## 2023-04-26 MED ORDER — MIRTAZAPINE 15 MG PO TABS: 15.0000 mg | ORAL_TABLET | Freq: Every day | ORAL | Status: DC

## 2023-04-26 MED ORDER — DIAZEPAM 2 MG PO TABS: 2.0000 mg | ORAL_TABLET | Freq: Two times a day (BID) | ORAL | Status: DC | PRN

## 2023-04-26 MED ORDER — MIRTAZAPINE 15 MG PO TABS: 15.0000 mg | ORAL_TABLET | Freq: Every day | ORAL | 0 refills | Status: DC

## 2023-04-26 MED ORDER — ENSURE ENLIVE PO LIQD
237.0000 mL | Freq: Two times a day (BID) | ORAL | Status: DC
  Administered 2023-04-26: 237 mL via ORAL

## 2023-04-26 MED ORDER — DIAZEPAM 2 MG PO TABS
2.0000 mg | ORAL_TABLET | Freq: Two times a day (BID) | ORAL | 0 refills | Status: DC | PRN
Start: 2023-04-26 — End: 2023-05-22

## 2023-04-26 NOTE — Assessment & Plan Note (Signed)
04-26-2023 pt being seen by psych consult prior to DC. Pt is not suicidial.

## 2023-04-26 NOTE — Assessment & Plan Note (Signed)
04-26-2023 was acutely worse on admission. Now back to baseline scr of 2.1

## 2023-04-26 NOTE — Assessment & Plan Note (Signed)
04-26-2023 admit Scr 2.52.  given IVF. Scr came down to 2.1.  her ckd stage 3b would not preclude her from back surgery.

## 2023-04-26 NOTE — Plan of Care (Signed)
Patient is alert and oriented, patient called [facility] to notify them about medications. All discharge medications discussed with Ryan-Pharmacist and verified in inpatient MAR. PIV removed, patient to wait in discharge lounge for ride home, ambulatory. All paperwork handed to patient and explained. No further needs atm.   Problem: Education: Goal: Ability to describe self-care measures that may prevent or decrease complications (Diabetes Survival Skills Education) will improve Outcome: Completed/Met Goal: Individualized Educational Video(s) Outcome: Completed/Met   Problem: Coping: Goal: Ability to adjust to condition or change in health will improve Outcome: Completed/Met   Problem: Fluid Volume: Goal: Ability to maintain a balanced intake and output will improve Outcome: Completed/Met   Problem: Health Behavior/Discharge Planning: Goal: Ability to identify and utilize available resources and services will improve Outcome: Completed/Met Goal: Ability to manage health-related needs will improve Outcome: Completed/Met   Problem: Metabolic: Goal: Ability to maintain appropriate glucose levels will improve Outcome: Completed/Met   Problem: Nutritional: Goal: Maintenance of adequate nutrition will improve Outcome: Completed/Met Goal: Progress toward achieving an optimal weight will improve Outcome: Completed/Met   Problem: Skin Integrity: Goal: Risk for impaired skin integrity will decrease Outcome: Completed/Met   Problem: Tissue Perfusion: Goal: Adequacy of tissue perfusion will improve Outcome: Completed/Met   Problem: Education: Goal: Knowledge of General Education information will improve Description: Including pain rating scale, medication(s)/side effects and non-pharmacologic comfort measures Outcome: Completed/Met   Problem: Health Behavior/Discharge Planning: Goal: Ability to manage health-related needs will improve Outcome: Completed/Met   Problem:  Clinical Measurements: Goal: Ability to maintain clinical measurements within normal limits will improve Outcome: Completed/Met Goal: Will remain free from infection Outcome: Completed/Met Goal: Diagnostic test results will improve Outcome: Completed/Met Goal: Respiratory complications will improve Outcome: Completed/Met Goal: Cardiovascular complication will be avoided Outcome: Completed/Met   Problem: Activity: Goal: Risk for activity intolerance will decrease Outcome: Completed/Met   Problem: Nutrition: Goal: Adequate nutrition will be maintained Outcome: Completed/Met   Problem: Coping: Goal: Level of anxiety will decrease Outcome: Completed/Met   Problem: Elimination: Goal: Will not experience complications related to bowel motility Outcome: Completed/Met Goal: Will not experience complications related to urinary retention Outcome: Completed/Met   Problem: Pain Managment: Goal: General experience of comfort will improve and/or be controlled Outcome: Completed/Met   Problem: Safety: Goal: Ability to remain free from injury will improve Outcome: Completed/Met   Problem: Skin Integrity: Goal: Risk for impaired skin integrity will decrease Outcome: Completed/Met

## 2023-04-26 NOTE — Progress Notes (Signed)
PROGRESS NOTE    [name]  ZOX:[id number] DOB: [birth year] DOA: 04/24/2023 PCP: Elberta Fortis, MD  Subjective: No new subjective & objective note has been filed under this hospital service since the last note was generated.    Hospital Course: HPI: [name] is a 53 y.o. female with medical history significant for obesity, insulin-dependent type 2 diabetes, depression, anxiety, hypertension, CKD stage III admitted to the hospital with acute on chronic renal failure in the setting of worsening sciatica.  She has had worsening progressive sciatica for almost the last year, pain is quite severe and debilitating, to the point that she is unable to work any longer, unable to drive, and her quality of life has been significantly impacted.  She is clearly quite depressed.  She feels that she is not being heard, she has been to neurosurgery, pain management clinic in addition to her primary family practice doctor and feels that nobody has been able to help.  She has seen Dr. Franky Macho of neurosurgery who is willing to provide her with some surgical repair however she was told there is no guarantee that it would help.  She has a prior history of suicide attempt with pills over a decade ago, patient mentions to me multiple times that although she does not want to die, she has considered ending her life due to the severity of her pain.  She denies any fevers, chills, in addition to her left lower extremity sciatica she also has burning numbness and cold sensation in her bilateral feet.  She denies any saddle anesthesia, urinary or stool incontinence.   Significant Events: Admitted 04/24/2023 for acute on CKD 3b   Significant Labs: WBC 7.9, HgB 11.7, plt 521 Na 139, K 4.6, BUN 34, Scr 2.52, glu 148  Significant Imaging Studies: Lumbar XR No acute findings. 2. Multilevel degenerative disc disease and facet arthropathy. 3. Unchanged slight anterolisthesis of L4 on L5.  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 Able to walk 120 feet. Pt needs to lose 15 more lbs and she says that neurosurgery would offer her back surgery. I am willing to write for 7 days of oxycodone for her back pain. Pt is followed by [facility]. Defer to her PCP at [facility] clinic if they want to continue opiates for her back pain/sciatica.  Acute kidney injury superimposed on stage 3b chronic kidney disease (HCC) 04-26-2023 admit Scr 2.52.  given IVF. Scr came down to 2.1.  her ckd stage 3b would not preclude her from back surgery.  Major depressive disorder, recurrent severe without psychotic features (HCC) 04-26-2023 pt being seen by psych consult prior to DC. Pt is not suicidial.  CKD stage 3b, GFR 30-44 ml/min (HCC) - baseline Scr 1.7-2.1 04-26-2023 was acutely worse on admission. Now back to baseline scr of 2.1  Essential hypertension, benign 04-26-2023 stable. Defer to pt's FP PCP to refer to nephrology as outpatient.  Obesity, Class III, BMI 40-49.9 (morbid obesity) (HCC) Estimated body mass index is 41.84 kg/m as calculated from the following:   Height as of this encounter: 5\' 11"  (1.803 m).   Weight as of this encounter: 136.1 kg.   T2DM (type 2 diabetes mellitus) (HCC) 04-26-2023 pt states she just start Monjauro last week. Continue lantus and novolog at home.  Acquired hypothyroidism 04-26-2023 stable.  On synthroid 300 mg daily.  DVT prophylaxis: heparin injection 5,000 Units Start: 04/25/23 0815 SCDs Start: 04/25/23 0804    Code Status: Full Code Family Communication: no family at bedside. She is decisional. Disposition Plan: return home Reason for continuing need for hospitalization: medically stable for DC.  Objective: Vitals:   04/25/23 2125 04/26/23 0610 04/26/23 1044 04/26/23 1049  BP: 108/63  126/80 133/65 133/65  Pulse: 87 91 93 93  Resp: 18 18    Temp: 98.3 F (36.8 C) 98.2 F (36.8 C)    TempSrc: Oral Oral    SpO2: 99% 100%    Weight:      Height:        Intake/Output Summary (Last 24 hours) at 04/26/2023 1143 Last data filed at 04/26/2023 0939 Gross per 24 hour  Intake 730 ml  Output 700 ml  Net 30 ml   Filed Weights   04/24/23 2302  Weight: 136.1 kg    Examination:  Physical Exam Vitals and nursing note reviewed.  Constitutional:      Appearance: She is obese.  HENT:     Head: Normocephalic and atraumatic.     Nose: Nose normal.  Cardiovascular:     Rate and Rhythm: Normal rate and regular rhythm.  Pulmonary:     Effort: Pulmonary effort is normal.     Breath sounds: Normal breath sounds.  Abdominal:     General: Bowel sounds are normal.     Palpations: Abdomen is soft.  Skin:    General: Skin is warm and dry.     Capillary Refill: Capillary refill takes less than 2 seconds.  Neurological:     Mental Status: She is alert and oriented to person, place, and time.     Data Reviewed: I have personally reviewed following labs and imaging studies  CBC: Recent Labs  Lab 04/24/23 2324  WBC 7.9  HGB 11.7*  HCT 37.4  MCV 86.4  PLT 521*   Basic Metabolic Panel: Recent Labs  Lab 04/24/23 2324 04/26/23 0702  NA 139 140  K 4.6 4.4  CL 108 108  CO2 21* 22  GLUCOSE 148* 151*  BUN 34* 38*  CREATININE 2.52* 2.15*  CALCIUM 9.0 9.2   GFR: Estimated Creatinine Clearance: 46.3 mL/min (A) (by C-G formula based on SCr of 2.15 mg/dL (H)). BNP (last 3 results) Recent Labs    07/12/22 1938 10/21/22 2216  BNP 29.7 125.1*   CBG: Recent Labs  Lab 04/25/23 0955 04/25/23 1145 04/25/23 1608 04/25/23 2126 04/26/23 0718  GLUCAP 140* 88 121* 84 127*   Radiology Studies: DG Lumbar Spine Complete Result Date: 04/25/2023 CLINICAL DATA:  Sciatica. EXAM: LUMBAR SPINE - COMPLETE 4+ VIEW COMPARISON:  MRI 07/01/2022 FINDINGS: Unchanged slight  anterolisthesis of L4 on L5 measuring 5 mm. The vertebral body heights are well preserved. No signs of acute fracture or subluxation. Multilevel disc space narrowing is again noted compatible with degenerative disc disease. This is most severe at L4-5 and L5-S1. Mild facet arthropathy identified at L4-5 and L5-S1. IMPRESSION: 1. No acute findings. 2. Multilevel degenerative disc disease and facet arthropathy. 3. Unchanged slight anterolisthesis of L4 on L5. Electronically Signed   By: Signa Kell M.D.   On: 04/25/2023 09:43    Scheduled Meds:  feeding supplement  237 mL Oral BID BM   FLUoxetine  20 mg Oral Daily   fluticasone  1 spray Each Nare Daily   gabapentin  100 mg Oral TID   heparin injection (subcutaneous)  5,000 Units Subcutaneous Q8H   insulin aspart  0-20 Units Subcutaneous TID WC   insulin aspart  0-5 Units Subcutaneous QHS   insulin aspart  10 Units Subcutaneous TID WC   insulin glargine-yfgn  45 Units Subcutaneous BID   isosorbide-hydrALAZINE  1 tablet Oral TID   levothyroxine  300 mcg Oral Q0600   methocarbamol  500 mg Oral TID   metoprolol succinate  50 mg Oral Daily   mirtazapine  15 mg Oral QHS   QUEtiapine  100 mg Oral QHS   Continuous Infusions:   LOS: 0 days   Time spent: 40 minutes  Carollee Herter, DO  [facility]  04/26/2023, 11:43 AM

## 2023-04-26 NOTE — Assessment & Plan Note (Signed)
04-26-2023 stable. On synthroid 300 mg daily.

## 2023-04-26 NOTE — Progress Notes (Signed)
Mobility Specialist - Progress Note   04/26/23 0941  Mobility  Activity Ambulated with assistance in hallway  Level of Assistance Standby assist, set-up cues, supervision of patient - no hands on  Assistive Device Front wheel walker  Distance Ambulated (ft) 120 ft  Activity Response Tolerated well  Mobility Referral Yes  Mobility visit 1 Mobility  Mobility Specialist Start Time (ACUTE ONLY) 0933  Mobility Specialist Stop Time (ACUTE ONLY) 0940  Mobility Specialist Time Calculation (min) (ACUTE ONLY) 7 min   Pt received on bench and agreeable to mobility. Distance limited d/t leg pain. Pt to recliner after session with all needs met. NT & MD in room.   [facility]

## 2023-04-26 NOTE — Assessment & Plan Note (Signed)
Estimated body mass index is 41.84 kg/m as calculated from the following:   Height as of this encounter: 5\' 11"  (1.803 m).   Weight as of this encounter: 136.1 kg.

## 2023-04-26 NOTE — Assessment & Plan Note (Signed)
04-26-2023 pt seen by PT. Able to walk 120 feet. Pt needs to lose 15 more lbs and she says that neurosurgery would offer her back surgery. I am willing to write for 7 days of oxycodone for her back pain. Pt is followed by [facility]. Defer to her PCP at [facility] clinic if they want to continue opiates for her back pain/sciatica.

## 2023-04-26 NOTE — Discharge Summary (Signed)
[facility] Physician Discharge Summary   Patient name: [name]  Admit date:     04/24/2023  Discharge date: 04/26/2023  Attending Physician: Maryln Gottron [[id number]]  Discharge Physician: Carollee Herter   PCP: Elberta Fortis, MD  Admitted From: Home  Disposition:  Home  Recommendations for Outpatient Follow-up:  Follow up with [facility] clinic as scheduled on 04-28-2023.  Home Health:No Equipment/Devices: None  Discharge Condition:Stable CODE STATUS:FULL Diet recommendation: Diabetic Fluid Restriction: None  Hospital Summary: HPI: [name] is a 53 y.o. female with medical history significant for obesity, insulin-dependent type 2 diabetes, depression, anxiety, hypertension, CKD stage III admitted to the hospital with acute on chronic renal failure in the setting of worsening sciatica.  She has had worsening progressive sciatica for almost the last year, pain is quite severe and debilitating, to the point that she is unable to work any longer, unable to drive, and her quality of life has been significantly impacted.  She is clearly quite depressed.  She feels that she is not being heard, she has been to neurosurgery, pain management clinic in addition to her primary family practice doctor and feels that nobody has been able to help.  She has seen Dr. Franky Macho of neurosurgery who is willing to provide her with some surgical repair however she was told there is no guarantee that it would help.  She has a prior history of suicide attempt with pills over a decade ago, patient mentions to me multiple times that although she does not want to die, she has considered ending her life due to the severity of her pain.  She denies any fevers, chills, in addition to her left lower extremity sciatica she also has burning numbness and cold sensation in her bilateral feet.  She denies any saddle anesthesia, urinary or stool incontinence.   Significant  Events: Admitted 04/24/2023 for acute on CKD 3b   Significant Labs: WBC 7.9, HgB 11.7, plt 521 Na 139, K 4.6, BUN 34, Scr 2.52, glu 148  Significant Imaging Studies: Lumbar XR No acute findings. 2. Multilevel degenerative disc disease and facet arthropathy. 3. Unchanged slight anterolisthesis of L4 on L5.  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 Able to walk 120 feet. Pt needs to lose 15 more lbs and she says that neurosurgery would offer her back surgery. I am willing to write for 7 days of oxycodone for her back pain. Pt is followed by [facility]. Defer to her PCP at [facility] clinic if they want to continue opiates for her back pain/sciatica.  Acute kidney injury superimposed on stage 3b chronic kidney disease (HCC) 04-26-2023 admit Scr 2.52.  given IVF. Scr came down to 2.1.  her ckd stage 3b would not preclude her from back surgery.  Major depressive disorder, recurrent severe without psychotic features (HCC) 04-26-2023 pt being seen by psych consult prior to DC. Pt is not suicidial.  CKD stage 3b, GFR 30-44 ml/min (HCC) - baseline Scr 1.7-2.1 04-26-2023 was acutely worse on admission. Now back to baseline scr of 2.1  Essential hypertension, benign 04-26-2023 stable. Defer to pt's FP PCP to refer to nephrology as outpatient.  Obesity, Class III, BMI 40-49.9 (morbid obesity) (HCC) Estimated body mass index is 41.84 kg/m as calculated from the following:   Height as  of this encounter: 5\' 11"  (1.803 m).   Weight as of this encounter: 136.1 kg.   T2DM (type 2 diabetes mellitus) (HCC) 04-26-2023 pt states she just start Monjauro last week. Continue lantus and novolog at home.  Acquired hypothyroidism 04-26-2023 stable. On synthroid 300 mg daily.   Discharge Diagnoses:  Principal Problem:    Lumbago syndrome Active Problems:   Acute kidney injury superimposed on stage 3b chronic kidney disease (HCC)   Major depressive disorder, recurrent severe without psychotic features (HCC)   Acquired hypothyroidism   T2DM (type 2 diabetes mellitus) (HCC)   Obesity, Class III, BMI 40-49.9 (morbid obesity) (HCC)   Essential hypertension, benign   CKD stage 3b, GFR 30-44 ml/min (HCC) - baseline Scr 1.7-2.1   Discharge Instructions  Discharge Instructions     Call MD for:  difficulty breathing, headache or visual disturbances   Complete by: As directed    Call MD for:  extreme fatigue   Complete by: As directed    Call MD for:  hives   Complete by: As directed    Call MD for:  persistant dizziness or light-headedness   Complete by: As directed    Call MD for:  persistant nausea and vomiting   Complete by: As directed    Call MD for:  redness, tenderness, or signs of infection (pain, swelling, redness, odor or green/yellow discharge around incision site)   Complete by: As directed    Call MD for:  severe uncontrolled pain   Complete by: As directed    Call MD for:  temperature >100.4   Complete by: As directed    Diet - low sodium heart healthy   Complete by: As directed    Discharge instructions   Complete by: As directed    1. Follow up with [facility] as scheduled on 04-28-2023.   Increase activity slowly   Complete by: As directed       Allergies as of 04/26/2023       Reactions   Ace Inhibitors Other (See Comments)   Patient had acute renal failure requiring hemodialysis after a suicide attempt.  Nephro recommended avoiding use.   Haldol [haloperidol Lactate] Other (See Comments)   Tardive dyskinesia   Nsaids Other (See Comments)   Nephro recommended avoiding after acute renal failure requiring hemodialysis associated with a suicide attempt.   Entresto [sacubitril-valsartan] Itching   Latex Other (See Comments)   Rash around IV site         Medication List     STOP taking these medications    LORazepam 1 MG tablet Commonly known as: ATIVAN   oxyCODONE-acetaminophen 5-325 MG tablet Commonly known as: Percocet       TAKE these medications    albuterol 108 (90 Base) MCG/ACT inhaler Commonly known as: VENTOLIN HFA Inhale 2 puffs into the lungs every 6 (six) hours as needed for shortness of breath.   blood glucose meter kit and supplies Kit Dispense based on patient and insurance preference. Use up to four times daily as directed.   diazepam 2 MG tablet Commonly known as: VALIUM Take 1 tablet (2 mg total) by mouth every 12 (twelve) hours as needed for anxiety or muscle spasms.   FLUoxetine 10 MG capsule Commonly known as: PROzac Take 1 capsule (10 mg total) by mouth daily. What changed: Another medication with the same name was added. Make sure you understand how and when to take each.   FLUoxetine 20 MG capsule Commonly known  as: PROZAC Take 1 capsule (20 mg total) by mouth daily. Start taking on: April 27, 2023 What changed: You were already taking a medication with the same name, and this prescription was added. Make sure you understand how and when to take each.   fluticasone 50 MCG/ACT nasal spray Commonly known as: FLONASE Place 1 spray into both nostrils daily. 1 spray in each nostril every day   fluticasone-salmeterol 250-50 MCG/ACT Aepb Commonly known as: Wixela Inhub Inhale 1 puff into the lungs in the morning and at bedtime. What changed:  when to take this reasons to take this   furosemide 40 MG tablet Commonly known as: LASIX Take 1 tablet by mouth once daily   gabapentin 300 MG capsule Commonly known as: NEURONTIN Take 1 capsule (300 mg total) by mouth 3 (three) times daily.   hydrOXYzine 50 MG tablet Commonly known as: ATARAX Take 1 tablet (50 mg total) by mouth 3 (three) times daily as needed for anxiety.   insulin glargine 100 UNIT/ML Solostar Pen Commonly known as:  LANTUS Inject 65 Units into the skin 2 (two) times daily.   Insulin Pen Needle 32G X 4 MM Misc 1 each by Does not apply route 3 (three) times daily.   isosorbide-hydrALAZINE 20-37.5 MG tablet Commonly known as: BIDIL Take 1 tablet by mouth 3 (three) times daily.   levothyroxine 300 MCG tablet Commonly known as: SYNTHROID Take 1 tablet (300 mcg total) by mouth daily at 6 (six) AM.   losartan 25 MG tablet Commonly known as: COZAAR Take 1 tablet (25 mg total) by mouth at bedtime.   methocarbamol 500 MG tablet Commonly known as: ROBAXIN Take 2 tablets (1,000 mg total) by mouth 3 (three) times daily.   metoprolol succinate 50 MG 24 hr tablet Commonly known as: TOPROL-XL Take 1 tablet (50 mg total) by mouth at bedtime. Take with or immediately following a meal. What changed: when to take this   mirtazapine 15 MG tablet Commonly known as: REMERON Take 1 tablet (15 mg total) by mouth at bedtime. Decrease by half in 2 weeks and then discontinue.  Weight gaining medication. What changed:  medication strength how much to take additional instructions   Mounjaro 5 MG/0.5ML Pen Generic drug: tirzepatide Inject 5 mg into the skin once a week.   NovoLOG FlexPen 100 UNIT/ML FlexPen Generic drug: insulin aspart INJECT 15 UNITS SUBCUTANEOUSLY WITH LUNCH AND INJECT 20 UNITS WITH SUPPER   Oxycodone HCl 10 MG Tabs Take 1 tablet (10 mg total) by mouth every 4 (four) hours as needed for up to 7 days for moderate pain (pain score 4-6).   QUEtiapine 100 MG tablet Commonly known as: SEROquel Take 1 tablet (100 mg total) by mouth at bedtime.   rosuvastatin 20 MG tablet Commonly known as: Crestor Take 1 tablet (20 mg total) by mouth daily.        Allergies  Allergen Reactions   Ace Inhibitors Other (See Comments)    Patient had acute renal failure requiring hemodialysis after a suicide attempt.  Nephro recommended avoiding use.   Haldol [Haloperidol Lactate] Other (See Comments)     Tardive dyskinesia   Nsaids Other (See Comments)    Nephro recommended avoiding after acute renal failure requiring hemodialysis associated with a suicide attempt.   Entresto [Sacubitril-Valsartan] Itching   Latex Other (See Comments)    Rash around IV site    Discharge Exam: Vitals:   04/26/23 1044 04/26/23 1049  BP: 133/65 133/65  Pulse: 93 93  Resp:    Temp:    SpO2:      Physical Exam Vitals and nursing note reviewed.  Constitutional:      Appearance: She is obese.  HENT:     Head: Normocephalic and atraumatic.     Nose: Nose normal.  Cardiovascular:     Rate and Rhythm: Normal rate and regular rhythm.  Pulmonary:     Effort: Pulmonary effort is normal.     Breath sounds: Normal breath sounds.  Abdominal:     General: Bowel sounds are normal.     Palpations: Abdomen is soft.  Skin:    General: Skin is warm and dry.     Capillary Refill: Capillary refill takes less than 2 seconds.  Neurological:     Mental Status: She is alert and oriented to person, place, and time.     The results of significant diagnostics from this hospitalization (including imaging, microbiology, ancillary and laboratory) are listed below for reference.    Labs: BNP (last 3 results) Recent Labs    07/12/22 1938 10/21/22 2216  BNP 29.7 125.1*   Basic Metabolic Panel: Recent Labs  Lab 04/24/23 2324 04/26/23 0702  NA 139 140  K 4.6 4.4  CL 108 108  CO2 21* 22  GLUCOSE 148* 151*  BUN 34* 38*  CREATININE 2.52* 2.15*  CALCIUM 9.0 9.2   CBC: Recent Labs  Lab 04/24/23 2324  WBC 7.9  HGB 11.7*  HCT 37.4  MCV 86.4  PLT 521*   CBG: Recent Labs  Lab 04/25/23 0955 04/25/23 1145 04/25/23 1608 04/25/23 2126 04/26/23 0718  GLUCAP 140* 88 121* 84 127*   Urinalysis    Component Value Date/Time   COLORURINE YELLOW 04/25/2023 0445   APPEARANCEUR HAZY (A) 04/25/2023 0445   LABSPEC 1.010 04/25/2023 0445   PHURINE 5.0 04/25/2023 0445   GLUCOSEU NEGATIVE 04/25/2023 0445    HGBUR NEGATIVE 04/25/2023 0445   BILIRUBINUR NEGATIVE 04/25/2023 0445   KETONESUR NEGATIVE 04/25/2023 0445   PROTEINUR NEGATIVE 04/25/2023 0445   NITRITE NEGATIVE 04/25/2023 0445   LEUKOCYTESUR TRACE (A) 04/25/2023 0445   Sepsis Labs Recent Labs  Lab 04/24/23 2324  WBC 7.9    Procedures/Studies: DG Lumbar Spine Complete Result Date: 04/25/2023 CLINICAL DATA:  Sciatica. EXAM: LUMBAR SPINE - COMPLETE 4+ VIEW COMPARISON:  MRI 07/01/2022 FINDINGS: Unchanged slight anterolisthesis of L4 on L5 measuring 5 mm. The vertebral body heights are well preserved. No signs of acute fracture or subluxation. Multilevel disc space narrowing is again noted compatible with degenerative disc disease. This is most severe at L4-5 and L5-S1. Mild facet arthropathy identified at L4-5 and L5-S1. IMPRESSION: 1. No acute findings. 2. Multilevel degenerative disc disease and facet arthropathy. 3. Unchanged slight anterolisthesis of L4 on L5. Electronically Signed   By: Signa Kell M.D.   On: 04/25/2023 09:43   ECHOCARDIOGRAM COMPLETE Result Date: 03/27/2023    ECHOCARDIOGRAM REPORT   Patient Name:   EPSIE WALTHALL Date of Exam: 03/27/2023 Medical Rec #:  [id number]       Height:       71.0 in Accession #:    [id number]      Weight:       306.0 lb Date of Birth:  11/24/1970        BSA:          2.526 m Patient Age:    52 years        BP:  128/80 mmHg Patient Gender: F               HR:           94 bpm. Exam Location:  Outpatient Procedure: 2D Echo, Color Doppler, Cardiac Doppler and Intracardiac            Opacification Agent Indications:    I50.22 Chronic systolic (congestive) heart failure  History:        Patient has prior history of Echocardiogram examinations, most                 recent 05/20/2021. CHF.  Sonographer:    Harriette Bouillon RDCS Referring Phys: [id number] MARSHALL L CHAMBLISS IMPRESSIONS  1. Left ventricular ejection fraction, by estimation, is 35 to 40%. The left ventricle has moderately decreased  function. The left ventricle demonstrates global hypokinesis. The left ventricular internal cavity size was mildly dilated. Left ventricular diastolic parameters are indeterminate.  2. Right ventricular systolic function was not well visualized. The right ventricular size is not well visualized.  3. The mitral valve is grossly normal. Trivial mitral valve regurgitation. No evidence of mitral stenosis.  4. The aortic valve was not well visualized. Aortic valve regurgitation is not visualized. No aortic stenosis is present. Comparison(s): No significant change from prior study. Conclusion(s)/Recommendation(s): Technically limited study, even with use of echo contrast. RV, atria, most valves not well seen. LV similar to prior. FINDINGS  Left Ventricle: Left ventricular ejection fraction, by estimation, is 35 to 40%. The left ventricle has moderately decreased function. The left ventricle demonstrates global hypokinesis. Definity contrast agent was given IV to delineate the left ventricular endocardial borders. The left ventricular internal cavity size was mildly dilated. There is borderline left ventricular hypertrophy. Left ventricular diastolic parameters are indeterminate. Right Ventricle: The right ventricular size is not well visualized. Right vetricular wall thickness was not well visualized. Right ventricular systolic function was not well visualized. Left Atrium: Left atrial size was not well visualized. Right Atrium: Right atrial size was not well visualized. Pericardium: There is no evidence of pericardial effusion. Mitral Valve: The mitral valve is grossly normal. Trivial mitral valve regurgitation. No evidence of mitral valve stenosis. Tricuspid Valve: The tricuspid valve is not well visualized. Tricuspid valve regurgitation is trivial. Aortic Valve: The aortic valve was not well visualized. Aortic valve regurgitation is not visualized. No aortic stenosis is present. Pulmonic Valve: The pulmonic valve was  not well visualized. Pulmonic valve regurgitation is not visualized. No evidence of pulmonic stenosis. Aorta: The aortic root, ascending aorta and aortic arch are all structurally normal, with no evidence of dilitation or obstruction. Venous: The inferior vena cava was not well visualized. IAS/Shunts: The interatrial septum was not well visualized.  LEFT VENTRICLE PLAX 2D LVIDd:         5.50 cm      Diastology LVIDs:         3.90 cm      LV e' medial:    4.24 cm/s LV PW:         1.00 cm      LV E/e' medial:  12.1 LV IVS:        1.00 cm      LV e' lateral:   9.14 cm/s LVOT diam:     2.00 cm      LV E/e' lateral: 5.6 LV SV:         33 LV SV Index:   13 LVOT Area:     3.14 cm  LV Volumes (MOD) LV vol d, MOD A2C: 102.0 ml LV vol d, MOD A4C: 69.7 ml LV vol s, MOD A2C: 53.6 ml LV vol s, MOD A4C: 41.7 ml LV SV MOD A2C:     48.4 ml LV SV MOD A4C:     69.7 ml LV SV MOD BP:      35.9 ml RIGHT VENTRICLE RV S prime:     5.87 cm/s LEFT ATRIUM           Index LA diam:      4.00 cm 1.58 cm/m LA Vol (A2C): 39.9 ml 15.79 ml/m  AORTIC VALVE LVOT Vmax:   59.40 cm/s LVOT Vmean:  42.200 cm/s LVOT VTI:    0.105 m  AORTA Ao Root diam: 3.10 cm Ao Asc diam:  3.40 cm MITRAL VALVE MV Area (PHT): 5.02 cm    SHUNTS MV Decel Time: 151 msec    Systemic VTI:  0.10 m MV E velocity: 51.40 cm/s  Systemic Diam: 2.00 cm MV A velocity: 88.30 cm/s MV E/A ratio:  0.58 Jodelle Red MD Electronically signed by Jodelle Red MD Signature Date/Time: 03/27/2023/8:13:01 PM    Final     Time coordinating discharge: 45 mins  SIGNED:  Carollee Herter, DO [facility] 04/26/23, 11:44 AM

## 2023-04-26 NOTE — Progress Notes (Addendum)
Ingrid did discharge and used a wheelchair to discharge patient. Ginnette worked with physical therapy before she left. IV was removed prior discharge.

## 2023-04-26 NOTE — Hospital Course (Addendum)
HPI: [name] is a 53 y.o. female with medical history significant for obesity, insulin-dependent type 2 diabetes, depression, anxiety, hypertension, CKD stage III admitted to the hospital with acute on chronic renal failure in the setting of worsening sciatica.  She has had worsening progressive sciatica for almost the last year, pain is quite severe and debilitating, to the point that she is unable to work any longer, unable to drive, and her quality of life has been significantly impacted.  She is clearly quite depressed.  She feels that she is not being heard, she has been to neurosurgery, pain management clinic in addition to her primary family practice doctor and feels that nobody has been able to help.  She has seen Dr. Franky Macho of neurosurgery who is willing to provide her with some surgical repair however she was told there is no guarantee that it would help.  She has a prior history of suicide attempt with pills over a decade ago, patient mentions to me multiple times that although she does not want to die, she has considered ending her life due to the severity of her pain.  She denies any fevers, chills, in addition to her left lower extremity sciatica she also has burning numbness and cold sensation in her bilateral feet.  She denies any saddle anesthesia, urinary or stool incontinence.   Significant Events: Admitted 04/24/2023 for acute on CKD 3b   Significant Labs: WBC 7.9, HgB 11.7, plt 521 Na 139, K 4.6, BUN 34, Scr 2.52, glu 148  Significant Imaging Studies: Lumbar XR No acute findings. 2. Multilevel degenerative disc disease and facet arthropathy. 3. Unchanged slight anterolisthesis of L4 on L5.  Antibiotic Therapy: Anti-infectives (From admission, onward)    None       Procedures:   Consultants: psych

## 2023-04-26 NOTE — Assessment & Plan Note (Signed)
04-26-2023 pt states she just start Monjauro last week. Continue lantus and novolog at home.

## 2023-04-26 NOTE — TOC Transition Note (Signed)
Transition of Care [facility]) - Discharge Note   Patient Details  Name: [name] MRN: [id number] Date of Birth: 1971/03/08  Transition of [facility]) CM/SW Contact:  Diona Browner, LCSW Phone Number: 04/26/2023, 12:23 PM   Clinical Narrative:     Pt ready for d/c. Pt set up with OPPT and will has appt set up for 2/19. No further TOC needs.   Final next level of care: OP Rehab Barriers to Discharge: No Barriers Identified   Patient Goals and CMS Choice Patient states their goals for this hospitalization and ongoing recovery are:: return home          Discharge Placement                       Discharge Plan and Services Additional resources added to the After Visit Summary for                    DME Agency: NA                  Social Drivers of Health (SDOH) Interventions SDOH Screenings   Food Insecurity: No Food Insecurity (04/25/2023)  Housing: Unknown (04/25/2023)  Transportation Needs: No Transportation Needs (04/25/2023)  Utilities: Not At Risk (04/25/2023)  Alcohol Screen: Low Risk  (02/24/2022)  Depression (PHQ2-9): High Risk (04/23/2023)  Tobacco Use: Low Risk  (04/25/2023)     Readmission Risk Interventions     No data to display

## 2023-04-26 NOTE — Consult Note (Addendum)
[facility] Psychiatric Consult Initial  Patient Name: .[name]  MRN: [id number]  DOB: 02-26-1971  Consult Order details:  Orders (From admission, onward)     Start     Ordered   04/25/23 0801  IP CONSULT TO PSYCHIATRY       Ordering Provider: Maryln Gottron, MD  Provider:  (Not yet assigned)  Question Answer Comment  Location [facility]   Reason for Consult? Very depressed 2/2 worsening back pain, passive suicidal thoughts.      04/25/23 0801             Mode of Visit: In person    Psychiatry Consult Evaluation  Service Date: April 26, 2023 LOS:  LOS: 0 days  Chief Complaint Left-sided sciatica   Primary Psychiatric Diagnoses  Major Depressive Disorder, recurrent, severe, without psychosis  Anxiety   Assessment  [name] is a 53 y.o. female admitted: Medicallyfor 04/24/2023 10:52 PM for back pain and leg pain. She carries the psychiatric diagnoses of major depressive disorder and has a past medical history of  obesity, diabetes mellitus, acute renal failure and sciatica.   Her current presentation of emotional instability, crying and refusing to talk to the provider on the second visit to allow her nurse to finish medications and per the chart of her complaining since admission of being "very depressed" as she can no longer work or drive because of her back pain (surgery is pending based on her weight loss per outpatient chart note) is most consistent with MDD. Current outpatient psychotropic medications include Quetiapine, Mirtazapine, Hydroxyzine and Gabapentin and historically she has had a moderately positive response to these medications. She was compliant with medications prior to admission as evidenced by past notes. On initial examination, patient was pleasant and smiling.  When this provider returned to allow time for her nurse to give medications who was at her bedside, she refused to talk with the team of two, turned her back and  started crying that she did not want to see [name], politely left the room. Please see plan below for detailed recommendations.   2/16: Today, she is much calmer and engaged easily in conversation.  She explained that yesterday she was upset that they came in and took out the sharp's box and placed a sitter with her.  "When they took out the sharp's container, it just hurt my heart that thought I would try to hurt myself. I'm not in that place (of wanting to end her life)"  She is adamant that she is not suicidal and told Dr. Doyne Keel that if she gets in that place that she would seek help and described the [facility] where she was directed to go.  She contracted for safety and when asked if the suicidal ideations returned what would she do.  "I would call the nurse".  See additional notes below in HPI section.  Collateral information on 04/26/23 from her 82 yo son, [name], who lives with her.  He has no safety concerns in regards to his mother.  Confirms there are no guns in the home.  Diagnoses:  Active Hospital problems: Principal Problem:   Lumbago syndrome    Plan   ## Psychiatric Medication Recommendations:  -Increased Prozac 10 mg daily to 20 mg daily on 2/15 -Decrease Remeron 30 mg daily at bedtime to 15 mg with goal to discontinue to assist in preventing weight -Continue Seroquel 100 mg at bedtime -Continue gabapentin 100 mg TID -Continue hydroxyzine 50  mg TID PRN -Continue Trazodone 25 mg at bedtime PRN, may need to increase with Remeron being decreased. -Changed her Ativan 1 mg BID PRN to Valium 2 mg BID PRN to assist with her anxiety and muscles spasms.  ## Medical Decision Making Capacity: Not specifically addressed in this encounter  ## Further Work-up:  -- EKG -- most recent EKG on 10/22/2022 had QtC of 479 --Ordered with results on 2/15 with QTc of 479 -- Pertinent labwork reviewed earlier this admission includes: Urine Tox, UA, CBC, BMP  ## Disposition:-- Continue outpatient  therapy and med management  ## Behavioral / Environmental: - Outpatient services (in place)    ## Safety and Observation Level:  - Based on my clinical evaluation, I estimate the patient to be at low risk of self harm in the current setting. - At this time, we recommend  routine. This decision is based on my review of the chart including patient's history and current presentation, interview of the patient, mental status examination, and consideration of suicide risk including evaluating suicidal ideation, plan, intent, suicidal or self-harm behaviors, risk factors, and protective factors. This judgment is based on our ability to directly address suicide risk, implement suicide prevention strategies, and develop a safety plan while the patient is in the clinical setting. Please contact our team if there is a concern that risk level has changed.  CSSR Risk Category: Low  Suicide Risk Assessment: Patient has following modifiable risk factors for suicide: under treated depression , which we are addressing by increasing her Prozac. Patient has following non-modifiable or demographic risk factors for suicide: one past suicide attempt in 2012 Patient has the following protective factors against suicide: Access to outpatient mental health services, supportive family, no self harm behaviors.  Thank you for this consult request. Recommendations have been communicated to the primary team.  We will continue to follow her.  Nanine Means, NP       History of Present Illness  Relevant Aspects of [facility] Hospital Course:  Admitted on 04/24/2023 for left-sided sciatica.   Patient Report:  The client was walking in the hall with her walker and therapy prior to the assessment.  She went and sat on her recliner and apologized for being upset yesterday.  Evidently, this provider entered after this occurred.  It upset her that they placed a sitter with her and took out the sharp's box as she was not going to  hurt herself.  "I don't want to end my life, I just want help with my pain" (back/sciatica).  Her back pain started on 05/23/22 and she has been to doctors (neurology, pain doctors, etc.) with no definitive answers.  Her neurologist said he could operate on her back where she has disc issues to hopefully correct the sciatica pain if she lost 115 pounds.  She has lost 100 pounds so far with the help of Ozempic and is continuing to lose the last 15 pounds.  When she goes to the pain doctor or others and asks for help and leaves after paying $100 with no pain medications, she gets upset.  "I cannot walk (without a walker) or work or help with my grandkids".  She expressed that she understands that some people are seeking drugs but she is not and just wants relief.  It's very frustrating when it is visibly obvious that she is suffering and no pain medications beside Robaxin are prescribed.  According to the PDMP, she only had 5 days of Percocet 5/325 in 8/24  and a few pain meds prior to that.  The only regular controlled medication she receives is Ativan.  She is getting OxyContin now and it helps, the pain doesn't go away but makes it bearable for her as evidenced by her ambulating in the hallway prior to the assessment with a walker and PT observing.  She has an 8, 14, and 28 yo grandsons and when they visit, she cannot do anything with them.  By June, she has to tell the school system if she is returning to work in August which she does not even know the answer.  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  Her goal is to get out of the house at least every other day in the car with one of them driving to help her mood.  She also meets with Toy Cookey regularly.  "I was doing pretty good until I met with her on Thursday and couldn't stop crying" as she was frustrated with her situation and wanting pain  relief.  It is frustrating for her to have different doctors telling her different things and not getting any relief or live her life.  Battling her insurance company is also frustrating for her.  Typically, she perseveres through her day to get things done and just hit a wall on Friday with her pain.  She came to the ED for help with the pain and was upset at the time that she voiced the pain was making her think about suicide at times.  "I don't want to end my life, I just want help with my pain."  Her depression is better today as her pain has improved.  She denies suicidal ideations and contracts for safety.  Anxiety is high related to her stressors. No substance abuse or psychosis.  Again, she see Toy Cookey regularly.  This provider offered inpatient psychiatric hospitalization services which she did not want.  "I want to go home".  She does not feel hospitalization would be helpful.  One past suicide attempt in 2012 via overdose on Doxepin with an admission at [facility].  [name] wants to continue outpatient services and her medications were just changed.  She denies guns in the home and does manage her own medications.  "I promised Dr. Doyne Keel that I would get help if I became suicidal and I will."  She contracts for safety and her sitter was discontinued.    Psych ROS:  Depression: high per notes Anxiety:  unable to assess, client refused the assessment Mania (lifetime and current): UTA, none per notes Psychosis: (lifetime and current): UTA, none per notes  Review of Systems  Constitutional: Negative.   HENT: Negative.    Eyes: Negative.   Respiratory: Negative.    Cardiovascular: Negative.   Gastrointestinal: Negative.   Genitourinary: Negative.   Musculoskeletal:  Positive for back pain.  Skin: Negative.   Neurological:  Positive for weakness.  Endo/Heme/Allergies: Negative.   Psychiatric/Behavioral:  Positive for depression. The patient is nervous/anxious.      Psychiatric and  Social History  Psychiatric History:  Information collected from chart notes and patient  Prev Dx/Sx: MDD, anxiety Current Psych Provider: Toy Cookey, PMHNP Home Meds (current): Seroquel, Remeron, Prozac, gabapentin Previous Med Trials: Cymbalta (can't afford it) Therapy: [facility]  Prior Psych Hospitalization: 2012 overdose on Doxepin Prior Self Harm: overdose in 2012 Prior Violence: none  Family Psych  History:  none Family Hx suicide: none  Social History:  Occupational Hx: unable to work recently due to back pain Legal Hx: none Living Situation: lives with her husband and adult son who are supportive Access to weapons/lethal means: None  Substance History No substance abuse  Exam Findings  Physical Exam: sciatica issues Vital Signs:  Temp:  [98.1 F (36.7 C)-98.4 F (36.9 C)] 98.2 F (36.8 C) (02/16 0610) Pulse Rate:  [87-94] 91 (02/16 0610) Resp:  [17-18] 18 (02/16 0610) BP: (108-138)/(63-90) 126/80 (02/16 0610) SpO2:  [99 %-100 %] 100 % (02/16 0610) Blood pressure 126/80, pulse 91, temperature 98.2 F (36.8 C), temperature source Oral, resp. rate 18, height 5\' 11"  (1.803 m), weight 136.1 kg, SpO2 100%. Body mass index is 41.84 kg/m.  Physical Exam Vitals and nursing note reviewed.  Constitutional:      Appearance: Normal appearance.  HENT:     Head: Normocephalic.     Nose: Nose normal.  Pulmonary:     Effort: Pulmonary effort is normal.  Musculoskeletal:     Cervical back: Normal range of motion.  Neurological:     General: No focal deficit present.     Mental Status: She is alert and oriented to person, place, and time.     Mental Status Exam: General Appearance: Casual  Orientation:  Alert and oriented times 4  Memory:  Good  Concentration:  Good  Recall:  Good  Attention  Good  Eye Contact:  Good  Speech:  Normal Rate  Language:  Good  Volume:  WDL  Mood: depressed, anxious  Affect:  Appropriate  Thought Process:  logical  Thought  Content:  no suicidal ideations  Suicidal Thoughts:  none  Homicidal Thoughts:  none  Judgement:  Good  Insight:  Good  Psychomotor Activity:  Decreased r/t back issues  Akathisia:  none  Fund of Knowledge:  good      Assets:  Leisure Time Resilience Social Support  Cognition:  WNL  ADL's:  mild assistance needed at times  AIMS (if indicated):        Other History   These have been pulled in through the EMR, reviewed, and updated if appropriate.  Family History:  The patient's family history includes Asthma in her father.  Medical History: Past Medical History:  Diagnosis Date   Acute renal failure (HCC) 05/27/10   hemodialysis for 6 weeks   Anxiety    Asthma    Depression    Diabetes mellitus    Hypertension    Rhabdomyolysis 06/06/10   after drug overdose   Suicide attempt by drug ingestion (HCC) 06/05/10   result rhabdomyolosis and ARF requrining dialysis     Surgical History: Past Surgical History:  Procedure Laterality Date   ANKLE ARTHROSCOPY Right 08/17/2015   Procedure: RIGHT ANKLE ARTHROSCOPY WITH SYNOVECTOMY AND LOOSE BODY EXCISION;  Surgeon: Marcene Corning, MD;  Location: [facility] OR;  Service: Orthopedics;  Laterality: Right;   CHOLECYSTECTOMY     TUBAL LIGATION  1998     Medications:   Current Facility-Administered Medications:    acetaminophen (TYLENOL) tablet 650 mg, 650 mg, Oral, Q6H PRN **OR** acetaminophen (TYLENOL) suppository 650 mg, 650 mg, Rectal, Q6H PRN, Kirby Crigler, Mir M, MD   albuterol (PROVENTIL) (2.5 MG/3ML) 0.083% nebulizer solution 2.5 mg, 2.5 mg, Nebulization, Q2H PRN, Kirby Crigler, Mir M, MD   calcium carbonate (TUMS - dosed in mg elemental calcium) chewable tablet 400 mg of elemental calcium, 2 tablet, Oral, TID BM PRN, Kirby Crigler, Mir M,  MD, 400 mg of elemental calcium at 04/26/23 0841   feeding supplement (ENSURE ENLIVE / ENSURE PLUS) liquid 237 mL, 237 mL, Oral, BID BM, Carollee Herter, DO   FLUoxetine (PROZAC) capsule 20 mg, 20 mg, Oral,  Daily, Laysha Childers Y, NP, 20 mg at 04/25/23 1131   fluticasone (FLONASE) 50 MCG/ACT nasal spray 1 spray, 1 spray, Each Nare, Daily, Kirby Crigler, Mir M, MD, 1 spray at 04/25/23 1132   gabapentin (NEURONTIN) capsule 100 mg, 100 mg, Oral, TID, Kirby Crigler, Mir M, MD, 100 mg at 04/25/23 2133   heparin injection 5,000 Units, 5,000 Units, Subcutaneous, Q8H, Kirby Crigler, Mir M, MD, 5,000 Units at 04/26/23 0615   insulin aspart (novoLOG) injection 0-20 Units, 0-20 Units, Subcutaneous, TID WC, Kirby Crigler, Mir M, MD, 3 Units at 04/26/23 1610   insulin aspart (novoLOG) injection 0-5 Units, 0-5 Units, Subcutaneous, QHS, Ikramullah, Mir M, MD   insulin aspart (novoLOG) injection 10 Units, 10 Units, Subcutaneous, TID WC, Kirby Crigler, Mir M, MD, 10 Units at 04/26/23 9604   insulin glargine-yfgn (SEMGLEE) injection 45 Units, 45 Units, Subcutaneous, BID, Kirby Crigler, Mir M, MD, 45 Units at 04/25/23 2133   isosorbide-hydrALAZINE (BIDIL) 20-37.5 MG per tablet 1 tablet, 1 tablet, Oral, TID, Kirby Crigler, Mir M, MD, 1 tablet at 04/25/23 2133   levothyroxine (SYNTHROID) tablet 300 mcg, 300 mcg, Oral, Q0600, Kirby Crigler, Mir M, MD, 300 mcg at 04/26/23 0615   LORazepam (ATIVAN) tablet 1 mg, 1 mg, Oral, Q4H PRN, 1 mg at 04/25/23 1131 **OR** LORazepam (ATIVAN) injection 1 mg, 1 mg, Intravenous, Q4H PRN, Kirby Crigler, Mir M, MD   methocarbamol (ROBAXIN) tablet 500 mg, 500 mg, Oral, TID, Kirby Crigler, Mir M, MD, 500 mg at 04/25/23 2133   metoprolol succinate (TOPROL-XL) 24 hr tablet 50 mg, 50 mg, Oral, Daily, Kirby Crigler, Mir M, MD, 50 mg at 04/25/23 0948   mirtazapine (REMERON) tablet 30 mg, 30 mg, Oral, QHS, Sigrid Schwebach Y, NP, 30 mg at 04/25/23 2133   ondansetron (ZOFRAN) tablet 4 mg, 4 mg, Oral, Q6H PRN **OR** ondansetron (ZOFRAN) injection 4 mg, 4 mg, Intravenous, Q6H PRN, Kirby Crigler, Mir M, MD   Oral care mouth rinse, 15 mL, Mouth Rinse, PRN, Kirby Crigler, Mir M, MD   oxyCODONE (Oxy IR/ROXICODONE) immediate release tablet 10 mg, 10  mg, Oral, Q4H PRN, Kirby Crigler, Mir M, MD, 10 mg at 04/26/23 5409   QUEtiapine (SEROQUEL) tablet 100 mg, 100 mg, Oral, QHS, Kirby Crigler, Mir M, MD, 100 mg at 04/25/23 2133   traZODone (DESYREL) tablet 25 mg, 25 mg, Oral, QHS PRN, Kirby Crigler, Mir Judie Petit, MD  Allergies: Allergies  Allergen Reactions   Ace Inhibitors Other (See Comments)    Patient had acute renal failure requiring hemodialysis after a suicide attempt.  Nephro recommended avoiding use.   Haldol [Haloperidol Lactate] Other (See Comments)    Tardive dyskinesia   Nsaids Other (See Comments)    Nephro recommended avoiding after acute renal failure requiring hemodialysis associated with a suicide attempt.   Entresto [Sacubitril-Valsartan] Itching   Latex Other (See Comments)    Rash around IV site    Nanine Means, NP

## 2023-04-26 NOTE — Assessment & Plan Note (Signed)
04-26-2023 stable. Defer to pt's FP PCP to refer to nephrology as outpatient.

## 2023-04-26 NOTE — Progress Notes (Signed)
   04/26/23 1221  TOC Brief Assessment  Insurance and Status Reviewed  Patient has primary care physician Yes  Home environment has been reviewed home w/ family  Prior level of function: independent  Prior/Current Home Services No current home services  Social Drivers of Health Review SDOH reviewed no interventions necessary  Readmission risk has been reviewed Yes  Transition of care needs no transition of care needs at this time   Pt set up w/ OPPT.

## 2023-04-27 NOTE — Progress Notes (Unsigned)
SUBJECTIVE:   CHIEF COMPLAINT / HPI:   Hospital follow-up Hospitalized from 2/14 to 2/16 at [facility] for acute on chronic renal failure.  Kidney function improved with MIVF.  Chronic low back pain with left-sided sciatica Ongoing symptoms, planning for epidural injection in her back on [facility].  Also planning to see neurology for peripheral neuropathy, has appointment scheduled for 05/2023.  Discussed given ongoing symptoms likely unable to return to work on 06/09/2023.  Will update return to work date to 08/28/2023 as this is the last day her job will be kept available.  MDD, anxiety Seen by psychiatry during inpatient hospitalization.  Switch from duloxetine to Prozac 20 mg daily.  Additionally switch from lorazepam to diazepam.  Discussed with patient we will see how she does with diazepam and can continue prescribing given she was already receiving lorazepam from our clinic.  T2DM with peripheral neuropathy Continues to try to lose weight, on Mounjaro 5 mg weekly and tolerating it.  Will plan to titrate up and advised patient we can increase dose to maximize weight loss to hopefully help her receive back surgery if indicated.  She also indicates her feet always feel freezing.  History of peripheral neuropathy and was started on gabapentin 300 3 times daily in the hospital.  Reports she just started taking it.  No circulation concerns inpatient.  GERD Taking Famotidine 20mg  without relief of symptoms.  Denies difficulty swallowing.  Ongoing symptoms for years, feels they are worse during periods of stress.  No specific food triggers noted.   PERTINENT  PMH / PSH: Sciatica with L sided radiculopathy, T2DM, bipolar depression, CHF, CKD3b  OBJECTIVE:   BP 121/79   Pulse (!) 109   Ht 5\' 11"  (1.803 m)   Wt (!) 317 lb 12.8 oz (144.2 kg)   SpO2 100%   BMI 44.32 kg/m    General: Alert, no apparent distress, well groomed HEENT: Normocephalic,  atraumatic, moist mucus membranes, neck supple Respiratory: Normal respiratory effort GI: Non-distended Skin: No rashes, no jaundice Psych: Appropriate mood and affect MSK: Ambulates with a cane, difficulty with longer distances.  Able to get on and off exam table without assistance. Feet: 2+ dorsalis pedis and posterior tibialis pulses bilaterally.  No ulceration or dry cracking skin noted.  Feet cold to the touch bilaterally.  Loss of sensation at the level of ankle bilaterally.  ASSESSMENT/PLAN:   Assessment & Plan Type 2 diabetes mellitus with chronic kidney disease, with long-term current use of insulin, unspecified CKD stage (HCC) Last A1c 8.6 in 03/12/2023.  Previously switched from Ozempic 2 mg onto Mounjaro 5 mg weekly, will continue to titrate up for desired weight loss.  Foot exam consistent with peripheral neuropathy, recently started on gabapentin 300 mg 3 times daily inpatient, will continue current course and assess for improvement. -Increase to Mounjaro 7.5 mg weekly, can titrate up monthly if patient tolerating medication well Gastroesophageal reflux disease, unspecified whether esophagitis present Symptomatically not relieved with famotidine 20 mg daily, likely triggered by stressed. -Urea breath test to r/o H pylori -Start pantoprazole 40 mg daily, discussed long-term risks/benefits and will reevaluate in 6 weeks -Avoid triggers and manage stress as able CKD stage 3b, GFR 30-44 ml/min (HCC) - baseline Scr 1.7-2.1 Admitted for acute on chronic kidney failure, Cr 2.15 upon discharge (baseline ~1.7) therefore will repeat today. -BMP Major depressive disorder, recurrent severe without psychotic features (HCC) Switch from duloxetine to fluoxetine 20 mg daily and lorazepam  1 mg twice daily to diazepam 2 mg twice daily per inpatient psychiatry.  Continues to follow with outpatient behavioral health.  Mood stable today, denies SI and appears in good spirits. Chronic left-sided low  back pain with left-sided sciatica Chronic, uncontrolled.  Per patient report planning for epidural injection on 2/20 with [facility].  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 Elberta Fortis, DO [facility]

## 2023-04-28 ENCOUNTER — Encounter: Payer: Self-pay | Admitting: Family Medicine

## 2023-04-28 ENCOUNTER — Ambulatory Visit (INDEPENDENT_AMBULATORY_CARE_PROVIDER_SITE_OTHER): Payer: 59 | Admitting: Family Medicine

## 2023-04-28 VITALS — BP 121/79 | HR 109 | Ht 71.0 in | Wt 317.8 lb

## 2023-04-28 DIAGNOSIS — M5442 Lumbago with sciatica, left side: Secondary | ICD-10-CM

## 2023-04-28 DIAGNOSIS — Z0000 Encounter for general adult medical examination without abnormal findings: Secondary | ICD-10-CM

## 2023-04-28 DIAGNOSIS — Z794 Long term (current) use of insulin: Secondary | ICD-10-CM

## 2023-04-28 DIAGNOSIS — E1122 Type 2 diabetes mellitus with diabetic chronic kidney disease: Secondary | ICD-10-CM | POA: Diagnosis not present

## 2023-04-28 DIAGNOSIS — K219 Gastro-esophageal reflux disease without esophagitis: Secondary | ICD-10-CM | POA: Diagnosis not present

## 2023-04-28 DIAGNOSIS — N1832 Chronic kidney disease, stage 3b: Secondary | ICD-10-CM

## 2023-04-28 DIAGNOSIS — F332 Major depressive disorder, recurrent severe without psychotic features: Secondary | ICD-10-CM | POA: Diagnosis not present

## 2023-04-28 DIAGNOSIS — G8929 Other chronic pain: Secondary | ICD-10-CM

## 2023-04-28 DIAGNOSIS — Z23 Encounter for immunization: Secondary | ICD-10-CM

## 2023-04-28 MED ORDER — MOUNJARO 7.5 MG/0.5ML ~~LOC~~ SOAJ: 7.5000 mg | SUBCUTANEOUS | 1 refills | Status: DC

## 2023-04-28 MED ORDER — PANTOPRAZOLE SODIUM 40 MG PO TBEC: 40.0000 mg | DELAYED_RELEASE_TABLET | Freq: Every day | ORAL | 3 refills | Status: DC

## 2023-04-28 NOTE — Assessment & Plan Note (Signed)
Last A1c 8.6 in 03/12/2023.  Previously switched from Ozempic 2 mg onto Mounjaro 5 mg weekly, will continue to titrate up for desired weight loss.  Foot exam consistent with peripheral neuropathy, recently started on gabapentin 300 mg 3 times daily inpatient, will continue current course and assess for improvement. -Increase to Mounjaro 7.5 mg weekly, can titrate up monthly if patient tolerating medication well

## 2023-04-28 NOTE — Patient Instructions (Signed)
It was wonderful to see you today! Thank you for choosing [facility].   Please bring ALL of your medications with you to every visit.   Today we talked about:  For your reflux you can take the pantoprazole 40 mg daily for symptom relief.  I would also recommend avoiding any trigger foods you note in reducing her stress is much as possible.  Today we are testing you for a bacteria that can cause symptoms, I will follow-up with the results and discuss treatment if it is positive.  Overall I would recommend seeing the GI doctor to consider endoscopy if your symptoms continue. I will complete the letter for work and send it to [employer].  We will set the return date at 08/28/2023. For your neuropathy I think we need time for the gabapentin to take effect.  Please continue to take 300 mg 3 times per day and I will let you know if we need to make dose adjustments based on your lab work today.  We will recheck your kidney function today. I increased your Mounjaro to 7.5 mg weekly, I do think we can continue to titrate up if you tolerate the medication.  When you are ready to go to the next dose after a month or so please let the office know and happy to send in a new prescription.  You do not need to return to the office to see me to do this.  Please follow up in 6 weeks   We are checking some labs today. If they are abnormal, I will call you. If they are normal, I will send you a MyChart message (if it is active) or a letter in the mail. If you do not hear about your labs in the next 2 weeks, please call the office.  Call the clinic at [phone] if your symptoms worsen or you have any concerns.  Please be sure to schedule follow up at the front desk before you leave today.   Elberta Fortis, DO Family Medicine

## 2023-04-29 ENCOUNTER — Ambulatory Visit: Payer: 59 | Attending: Physical Medicine & Rehabilitation

## 2023-04-29 ENCOUNTER — Other Ambulatory Visit: Payer: Self-pay

## 2023-04-29 ENCOUNTER — Other Ambulatory Visit: Payer: Self-pay | Admitting: Family Medicine

## 2023-04-29 ENCOUNTER — Encounter: Payer: Self-pay | Admitting: Family Medicine

## 2023-04-29 DIAGNOSIS — E1140 Type 2 diabetes mellitus with diabetic neuropathy, unspecified: Secondary | ICD-10-CM

## 2023-04-29 DIAGNOSIS — M6281 Muscle weakness (generalized): Secondary | ICD-10-CM | POA: Insufficient documentation

## 2023-04-29 DIAGNOSIS — M5459 Other low back pain: Secondary | ICD-10-CM | POA: Diagnosis present

## 2023-04-29 DIAGNOSIS — R2689 Other abnormalities of gait and mobility: Secondary | ICD-10-CM | POA: Insufficient documentation

## 2023-04-29 LAB — BASIC METABOLIC PANEL
BUN/Creatinine Ratio: 18 (ref 9–23)
BUN: 38 mg/dL — ABNORMAL HIGH (ref 6–24)
CO2: 21 mmol/L (ref 20–29)
Calcium: 9.3 mg/dL (ref 8.7–10.2)
Chloride: 105 mmol/L (ref 96–106)
Creatinine, Ser: 2.16 mg/dL — ABNORMAL HIGH (ref 0.57–1.00)
Glucose: 114 mg/dL — ABNORMAL HIGH (ref 70–99)
Potassium: 5.1 mmol/L (ref 3.5–5.2)
Sodium: 143 mmol/L (ref 134–144)
eGFR: 27 mL/min/{1.73_m2} — ABNORMAL LOW (ref >59)

## 2023-04-29 LAB — H. PYLORI BREATH TEST: H pylori Breath Test: NEGATIVE

## 2023-04-29 MED ORDER — GABAPENTIN 100 MG PO CAPS: 200.0000 mg | ORAL_CAPSULE | Freq: Three times a day (TID) | ORAL | 3 refills | Status: DC

## 2023-04-29 NOTE — Assessment & Plan Note (Signed)
Admitted for acute on chronic kidney failure, Cr 2.15 upon discharge (baseline ~1.7) therefore will repeat today. -BMP

## 2023-04-29 NOTE — Assessment & Plan Note (Signed)
Chronic, uncontrolled.  Per patient report planning for epidural injection on 2/20 with [facility].  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

## 2023-04-29 NOTE — Assessment & Plan Note (Signed)
Switch from duloxetine to fluoxetine 20 mg daily and lorazepam 1 mg twice daily to diazepam 2 mg twice daily per inpatient psychiatry.  Continues to follow with outpatient behavioral health.  Mood stable today, denies SI and appears in good spirits.

## 2023-04-29 NOTE — Therapy (Signed)
OUTPATIENT PHYSICAL THERAPY THORACOLUMBAR EVALUATION   Patient Name: [name] MRN: [id number] DOB:05-31-1970, 53 y.o., female Today's Date: 04/30/2023  END OF SESSION:  PT End of Session - 04/29/23 1121     Visit Number 1    Number of Visits 17    Date for PT Re-Evaluation 06/24/23    Authorization Type [employer] State Plan    PT Start Time 1032    PT Stop Time 1115    PT Time Calculation (min) 43 min    Activity Tolerance Patient limited by pain    Behavior During Therapy Anxious             Past Medical History:  Diagnosis Date   Acute renal failure (HCC) 05/27/2010   hemodialysis for 6 weeks   Anxiety    Asthma    Depression    Diabetes mellitus    Hypertension    Rhabdomyolysis 06/06/2010   after drug overdose   Suicide attempt by drug ingestion (HCC) 06/05/2010   result rhabdomyolosis and ARF requrining dialysis    SVT (supraventricular tachycardia) (HCC) 05/28/2021   Past Surgical History:  Procedure Laterality Date   ANKLE ARTHROSCOPY Right 08/17/2015   Procedure: RIGHT ANKLE ARTHROSCOPY WITH SYNOVECTOMY AND LOOSE BODY EXCISION;  Surgeon: Marcene Corning, MD;  Location: [facility] OR;  Service: Orthopedics;  Laterality: Right;   CHOLECYSTECTOMY     TUBAL LIGATION  1998   Patient Active Problem List   Diagnosis Date Noted   Acute kidney injury superimposed on stage 3b chronic kidney disease (HCC) 04/26/2023   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   Chronic low back pain with left-sided sciatica 07/08/2022   Chronic systolic CHF (congestive heart failure) (HCC) - LVEF 35-40% by echo 03-2023    Bilateral knee pain 03/14/2019   HLD (hyperlipidemia) 03/19/2017   Asthma 03/14/2017   Major depressive disorder, recurrent severe without psychotic features (HCC) 05/11/2015   Vitamin D deficiency 04/11/2015   CKD stage 3b, GFR 30-44 ml/min (HCC) - baseline Scr 1.7-2.1 11/06/2010   Essential hypertension, benign 02/03/2007   Acquired hypothyroidism 05/07/2006   T2DM (type 2  diabetes mellitus) (HCC) 05/07/2006   Obesity, Class III, BMI 40-49.9 (morbid obesity) (HCC) 05/07/2006   Iron deficiency anemia 05/07/2006   Obsessive-compulsive disorder 05/07/2006   Generalized anxiety disorder 05/07/2006    PCP: Elberta Fortis, MD  REFERRING PROVIDER: Trena Platt, DO  REFERRING DIAG: low back pain   Rationale for Evaluation and Treatment: Rehabilitation  THERAPY DIAG:  Other low back pain - Plan: PT plan of care cert/re-cert  Other abnormalities of gait and mobility - Plan: PT plan of care cert/re-cert  Muscle weakness (generalized) - Plan: PT plan of care cert/re-cert  ONSET DATE: Chronic - March 2024  SUBJECTIVE:  SUBJECTIVE STATEMENT: Pt presents to PT with reports of chronic severe and debilitating L sided lower back pain with referral of pain and N/T in L LE. Denies bowel/bladder or saddle anesthesia. Is very tearful about her current inability to live a comfortable life, has tried therapy before with not much change in status. Has an epidural steroid injection tomorrow with pain medicine. 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   PERTINENT HISTORY:  Depression, HTN, DMII  PAIN:  Are you having pain?  Yes: NPRS scale: 10/10 Pain location: lower back, L LE Pain description: sharp, N/T, shooting pain Aggravating factors: prolonged sitting/standing, walking, bending, lifting Relieving factors: lying supine  PRECAUTIONS: Fall  RED FLAGS: None   WEIGHT BEARING RESTRICTIONS: No  FALLS:  Has patient fallen in last 6 months? No  LIVING ENVIRONMENT: Lives with: lives with their family Lives in: House/apartment Stairs: 3-level home - bed and bath on  Has following equipment at home: Quad cane small  base  OCCUPATION: Geologist, engineering for [employer]  PLOF: Independent  PATIENT GOALS: decrease pain, improve comfort with all ADLs and mobility, improve her quality of life  NEXT MD VISIT: 06/02/2023  OBJECTIVE:  Note: Objective measures were completed at Evaluation unless otherwise noted.  DIAGNOSTIC FINDINGS:  See imaging   PATIENT SURVEYS:  ODI: 44/50 - 88% disability   COGNITION: Overall cognitive status: Within functional limits for tasks assessed     SENSATION: Light touch: Impaired  - L LE  MUSCLE LENGTH: Hamstrings: DNT Thomas test: DNT  POSTURE: rounded shoulders, forward head, increased lumbar lordosis, flexed trunk , and weight shift left  PALPATION: TTP to L sided lumbar parapsinals  LUMBAR ROM:   AROM eval  Flexion 75% limitation  Extension 75% limitation  Right lateral flexion 75% limitation   Left lateral flexion 50% limitation  Right rotation   Left rotation    (Blank rows = not tested)  LOWER EXTREMITY MMT:    MMT Right eval Left eval  Hip flexion 3+/5 2+/5  Hip extension    Hip abduction 3/5 2+/5  Hip adduction    Hip internal rotation    Hip external rotation    Knee flexion    Knee extension    Ankle dorsiflexion    Ankle plantarflexion    Ankle inversion    Ankle eversion     (Blank rows = not tested)  LUMBAR SPECIAL TESTS:  Straight leg raise test: Positive and Slump test: Positive  FUNCTIONAL TESTS:  30 Second STS: 1 rep with UE  GAIT: Distance walked: 41ft Assistive device utilized: Quad cane small base Level of assistance: SBA Comments: lateral shift to L, antalgic gait on L  TREATMENT: [facility] Adult PT Treatment:                                                  Therapeutic Exercise: Supine glute sets x 10 - 5" hold Supine quad sets x 10 - 5" hold Modalities: IFC ESTIM with MHP to lumbar paraspinals x 10 min; decreased to 40 during   PATIENT EDUCATION:  Education details: eval findings, ODI, HEP, POC Person  educated: Patient Education method: Explanation, Demonstration, and Handouts Education comprehension: verbalized understanding and returned demonstration  HOME EXERCISE PROGRAM: Access Code: [email or web address] URL: [email or web address] Date: 04/29/2023 Prepared by:  Edwinna Areola  Exercises - Supine Gluteal Sets  - 2 x daily - 7 x weekly - 2 sets - 10 reps - 5 sec hold - Supine Quadricep Sets  - 2 x daily - 7 x weekly - 2 sets - 10 reps - 5 sec hold  Patient Education - TENS Therapy  ASSESSMENT:  CLINICAL IMPRESSION: Patient is a 53 y.o. F who was seen today for physical therapy evaluation and treatment for chronic LBP with L sided sciatica. Physical findings show significant decrease in functional mobility and strength with pain limiting further objective measures. ODI score shows severe disability in performance of home ADLs and community activities. Pt did note some decrease in pain with treatment today, she would benefit from slow progression of activity and mobility in order to improve ADL performance and quality of life.   OBJECTIVE IMPAIRMENTS: Abnormal gait, decreased activity tolerance, decreased balance, decreased endurance, decreased mobility, difficulty walking, decreased ROM, decreased strength, impaired sensation, improper body mechanics, postural dysfunction, and pain  ACTIVITY LIMITATIONS: carrying, lifting, bending, sitting, standing, squatting, sleeping, stairs, transfers, and locomotion level  PARTICIPATION LIMITATIONS: meal prep, cleaning, driving, shopping, community activity, occupation, and yard work  PERSONAL FACTORS: Fitness, Time since onset of injury/illness/exacerbation, and 3+ comorbidities: Depression, HTN, DMII  are also affecting patient's functional outcome.   REHAB POTENTIAL: Fair - may be limited by length and complexity of symptoms  CLINICAL DECISION MAKING: Evolving/moderate complexity  EVALUATION COMPLEXITY: Moderate   GOALS: Goals  reviewed with patient? No  SHORT TERM GOALS: Target date: 05/20/2023   Pt will be compliant and knowledgeable with initial HEP for improved comfort and carryover Baseline: initial HEP given  Goal status: INITIAL  2.  Pt will self report lower back and L LE pain no greater than 8/10 for improved comfort and functional ability Baseline: 10/10 at worst Goal status: INITIAL   LONG TERM GOALS: Target date: 06/24/2023   Pt will be decrease ODI disability score to no greater than 70% (35/50) as proxy for functional improvement Baseline: 88% disability (44/50) Goal status: INITIAL  2.  Pt will self report lower back and L LE pain no greater than 5/10 for improved comfort and functional ability Baseline: 10/10 at worst Goal status: INITIAL   3.  Pt will increase 30 Second Sit to Stand rep count to no less than 3 reps for improved balance, strength, and functional mobility Baseline: 1 rep with UE Goal status: INITIAL   4.  Pt will improve standing activity tolerance to at least 15 minutes in order to more comfortably make a meal and improve ADL performance Baseline: 2 minutes Goal status: INITIAL   PLAN:  PT FREQUENCY: 2x/week  PT DURATION: 8 weeks  PLANNED INTERVENTIONS: 97164- PT Re-evaluation, 97110-Therapeutic exercises, 97530- Therapeutic activity, [id number]- Neuromuscular re-education, 97535- Self Care, [id number]- Manual therapy, [id number]- Gait training, [id number]- Aquatic Therapy, [id number]- Electrical stimulation (unattended), [id number]- Electrical stimulation (manual), 97016- Vasopneumatic device, Dry Needling, Cryotherapy, and Moist heat.  PLAN FOR NEXT SESSION: assess HEP response, slow progression of core and hip strength, modalities and manual for decreasing pain   Eloy End, PT 04/30/2023, 11:00 AM

## 2023-05-06 ENCOUNTER — Ambulatory Visit: Payer: 59 | Admitting: Physical Therapy

## 2023-05-06 ENCOUNTER — Encounter: Payer: Self-pay | Admitting: Physical Therapy

## 2023-05-06 DIAGNOSIS — M6281 Muscle weakness (generalized): Secondary | ICD-10-CM

## 2023-05-06 DIAGNOSIS — M5459 Other low back pain: Secondary | ICD-10-CM

## 2023-05-06 DIAGNOSIS — R2689 Other abnormalities of gait and mobility: Secondary | ICD-10-CM

## 2023-05-06 NOTE — Therapy (Addendum)
 OUTPATIENT PHYSICAL THERAPY TREATMENT NOTE/DISCHARGE  PHYSICAL THERAPY DISCHARGE SUMMARY  Visits from Start of Care: 2  Current functional level related to goals / functional outcomes: See goals/objective   Remaining deficits: Unable to assess   Education / Equipment: HEP   Patient agrees to discharge. Patient goals were unable to assess. Patient is being discharged due to not returning since the last visit.     Patient Name: [name] MRN: [id number] DOB:[birth year], 53 y.o., female Today's Date: 05/06/2023  END OF SESSION:  PT End of Session - 05/06/23 1019     Visit Number 2    Number of Visits 17    Date for PT Re-Evaluation 06/24/23    Authorization Type [employer] State Plan    PT Start Time 1016    PT Stop Time 1050    PT Time Calculation (min) 34 min             Past Medical History:  Diagnosis Date   Acute renal failure (HCC) 05/27/2010   hemodialysis for 6 weeks   Anxiety    Asthma    Depression    Diabetes mellitus    Hypertension    Rhabdomyolysis 06/06/2010   after drug overdose   Suicide attempt by drug ingestion (HCC) 06/05/2010   result rhabdomyolosis and ARF requrining dialysis    SVT (supraventricular tachycardia) (HCC) 05/28/2021   Past Surgical History:  Procedure Laterality Date   ANKLE ARTHROSCOPY Right 08/17/2015   Procedure: RIGHT ANKLE ARTHROSCOPY WITH SYNOVECTOMY AND LOOSE BODY EXCISION;  Surgeon: Dayne Even, MD;  Location: [facility] OR;  Service: Orthopedics;  Laterality: Right;   CHOLECYSTECTOMY     TUBAL LIGATION  1998   Patient Active Problem List   Diagnosis Date Noted   Acute kidney injury superimposed on stage 3b chronic kidney disease (HCC) 04/26/2023   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   Chronic low back pain with left-sided sciatica 07/08/2022   Chronic systolic CHF (congestive heart failure) (HCC) - LVEF 35-40% by echo 03-2023    Bilateral knee pain 03/14/2019   HLD (hyperlipidemia) 03/19/2017   Asthma 03/14/2017   Major  depressive disorder, recurrent severe without psychotic features (HCC) 05/11/2015   Vitamin D deficiency 04/11/2015   CKD stage 3b, GFR 30-44 ml/min (HCC) - baseline Scr 1.7-2.1 11/06/2010   Essential hypertension, benign 02/03/2007   Acquired hypothyroidism 05/07/2006   T2DM (type 2 diabetes mellitus) (HCC) 05/07/2006   Obesity, Class III, BMI 40-49.9 (morbid obesity) (HCC) 05/07/2006   Iron deficiency anemia 05/07/2006   Obsessive-compulsive disorder 05/07/2006   Generalized anxiety disorder 05/07/2006    PCP: Jonne Netters, MD  REFERRING PROVIDER: Murray Arnold, DO  REFERRING DIAG: low back pain   Rationale for Evaluation and Treatment: Rehabilitation  THERAPY DIAG:  Other low back pain  Other abnormalities of gait and mobility  Muscle weakness (generalized)  ONSET DATE: Chronic - March 2024  SUBJECTIVE:  SUBJECTIVE STATEMENT: I hurt so bad. I feel like my back is going to rupture through my skin. My job is only held until June and then I wont be able to earn an income. It takes me 2 hours to get ready just to come to appointments.   EVAL: Pt presents to PT with reports of chronic severe and debilitating L sided lower back pain with referral of pain and N/T in L LE. Denies bowel/bladder or saddle anesthesia. Is very tearful about her current inability to live a comfortable life, has tried therapy before with not much change in status. Has an epidural steroid injection tomorrow with pain medicine. 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   PERTINENT HISTORY:  Depression, HTN, DMII  PAIN:  Are you having pain?  Yes: NPRS scale: 10/10 Pain location: lower back, L LE Pain description: sharp, N/T, shooting pain Aggravating factors: prolonged  sitting/standing, walking, bending, lifting Relieving factors: lying supine  PRECAUTIONS: Fall  RED FLAGS: None   WEIGHT BEARING RESTRICTIONS: No  FALLS:  Has patient fallen in last 6 months? No  LIVING ENVIRONMENT: Lives with: lives with their family Lives in: House/apartment Stairs: 3-level home - bed and bath on  Has following equipment at home: Quad cane small base  OCCUPATION: Geologist, engineering for [employer]  PLOF: Independent  PATIENT GOALS: decrease pain, improve comfort with all ADLs and mobility, improve her quality of life  NEXT MD VISIT: 06/02/2023  OBJECTIVE:  Note: Objective measures were completed at Evaluation unless otherwise noted.  DIAGNOSTIC FINDINGS:  See imaging   PATIENT SURVEYS:  ODI: 44/50 - 88% disability   COGNITION: Overall cognitive status: Within functional limits for tasks assessed     SENSATION: Light touch: Impaired  - L LE  MUSCLE LENGTH: Hamstrings: DNT Thomas test: DNT  POSTURE: rounded shoulders, forward head, increased lumbar lordosis, flexed trunk , and weight shift left  PALPATION: TTP to L sided lumbar parapsinals  LUMBAR ROM:   AROM eval  Flexion 75% limitation  Extension 75% limitation  Right lateral flexion 75% limitation   Left lateral flexion 50% limitation  Right rotation   Left rotation    (Blank rows = not tested)  LOWER EXTREMITY MMT:    MMT Right eval Left eval  Hip flexion 3+/5 2+/5  Hip extension    Hip abduction 3/5 2+/5  Hip adduction    Hip internal rotation    Hip external rotation    Knee flexion    Knee extension    Ankle dorsiflexion    Ankle plantarflexion    Ankle inversion    Ankle eversion     (Blank rows = not tested)  LUMBAR SPECIAL TESTS:  Straight leg raise test: Positive and Slump test: Positive  FUNCTIONAL TESTS:  30 Second STS: 1 rep with UE  GAIT: Distance walked: 34ft Assistive device utilized: Quad cane small base Level of assistance: SBA Comments: lateral shift  to L, antalgic gait on L  TREATMENT: [facility] Adult PT Treatment:                                                DATE: 05/06/23 Self Care: positioning, POC management, pain management strategies including ice, heat and TENS which she has at home, expectations to participate with PT.   Pt  unable to tolerate activity today. Ambulation into gym area increased back pain significantly. Pt able to lye side lying to relieve some of the pain. Pt tearful during session with concerns about her lively hood. Her pain has become debilitating and she is unable to find relief an unable to work. Time spent with discussion of condition and explanation of diagnosis. Pt will see Neurosurgeon next week to hopefully discuss surgical options. At last visit, her low back skin was blistered after her IFC/Estim treatment. Today she was offered ice pack. Due to her inability to participate, recommended patient cancel appointments and return to MD. Pt will consider this after her Neurosurgeon consult next week.    [facility] Adult PT Treatment:                                                  Therapeutic Exercise: Supine glute sets x 10 - 5" hold Supine quad sets x 10 - 5" hold Modalities: IFC ESTIM with MHP to lumbar paraspinals x 10 min; decreased to 40 during   PATIENT EDUCATION:  Education details: eval findings, ODI, HEP, POC Person educated: Patient Education method: Explanation, Demonstration, and Handouts Education comprehension: verbalized understanding and returned demonstration  HOME EXERCISE PROGRAM: Access Code: [email or web address] URL: [email or web address] Date: 04/29/2023 Prepared by: Loral Roch  Exercises - Supine Gluteal Sets  - 2 x daily - 7 x weekly - 2 sets - 10 reps - 5 sec hold - Supine Quadricep Sets  - 2 x daily - 7 x weekly - 2 sets - 10 reps - 5 sec hold  Patient Education - TENS Therapy  ASSESSMENT:  CLINICAL IMPRESSION: Pt unable to tolerate activity today. Ambulation into gym  area increased back pain significantly. Pt able to lye side lying to relieve some of the pain. Pt tearful during session with concerns about her lively hood. Her pain has become debilitating and she is unable to find relief an unable to work. Time spent with discussion of condition and explanation of diagnosis. Pt will see Neurosurgeon next week to hopefully discuss surgical options. At last visit, her low back skin was blistered after her IFC/Estim treatment. Today she was offered ice pack. Due to her inability to participate, recommended patient cancel appointments and return to MD. Pt will consider this after her Neurosurgeon consult next week.   EVAL: Patient is a 53 y.o. F who was seen today for physical therapy evaluation and treatment for chronic LBP with L sided sciatica. Physical findings show significant decrease in functional mobility and strength with pain limiting further objective measures. ODI score shows severe disability in performance of home ADLs and community activities. Pt did note some decrease in pain with treatment today, she would benefit from slow progression of activity and mobility in order to improve ADL performance and quality of life.   OBJECTIVE IMPAIRMENTS: Abnormal gait, decreased activity tolerance, decreased balance, decreased endurance, decreased mobility, difficulty walking, decreased ROM, decreased strength, impaired sensation, improper body mechanics, postural dysfunction, and pain  ACTIVITY LIMITATIONS: carrying, lifting, bending, sitting, standing, squatting, sleeping, stairs, transfers, and locomotion level  PARTICIPATION LIMITATIONS: meal prep, cleaning, driving, shopping, community activity, occupation, and yard work  PERSONAL FACTORS: Fitness, Time since onset of injury/illness/exacerbation, and 3+ comorbidities: Depression, HTN, DMII  are also affecting patient's functional outcome.   REHAB POTENTIAL: Fair - may be limited by  length and complexity of  symptoms  CLINICAL DECISION MAKING: Evolving/moderate complexity  EVALUATION COMPLEXITY: Moderate   GOALS: Goals reviewed with patient? No  SHORT TERM GOALS: Target date: 05/20/2023   Pt will be compliant and knowledgeable with initial HEP for improved comfort and carryover Baseline: initial HEP given  Goal status: INITIAL  2.  Pt will self report lower back and L LE pain no greater than 8/10 for improved comfort and functional ability Baseline: 10/10 at worst Goal status: INITIAL   LONG TERM GOALS: Target date: 06/24/2023   Pt will be decrease ODI disability score to no greater than 70% (35/50) as proxy for functional improvement Baseline: 88% disability (44/50) Goal status: INITIAL  2.  Pt will self report lower back and L LE pain no greater than 5/10 for improved comfort and functional ability Baseline: 10/10 at worst Goal status: INITIAL   3.  Pt will increase 30 Second Sit to Stand rep count to no less than 3 reps for improved balance, strength, and functional mobility Baseline: 1 rep with UE Goal status: INITIAL   4.  Pt will improve standing activity tolerance to at least 15 minutes in order to more comfortably make a meal and improve ADL performance Baseline: 2 minutes Goal status: INITIAL   PLAN:  PT FREQUENCY: 2x/week  PT DURATION: 8 weeks  PLANNED INTERVENTIONS: 97164- PT Re-evaluation, 97110-Therapeutic exercises, 97530- Therapeutic activity, [id number]- Neuromuscular re-education, 97535- Self Care, 16109- Manual therapy, [id number]- Gait training, [id number]- Aquatic Therapy, U0454- Electrical stimulation (unattended), [id number]- Electrical stimulation (manual), 97016- Vasopneumatic device, Dry Needling, Cryotherapy, and Moist heat.  PLAN FOR NEXT SESSION: assess HEP response, slow progression of core and hip strength, modalities and manual for decreasing pain   Gasper Karst, PTA 05/06/23 1:40 PM Phone: [phone] Fax: [phone]

## 2023-05-07 ENCOUNTER — Ambulatory Visit: Payer: Self-pay | Admitting: Physical Therapy

## 2023-05-12 ENCOUNTER — Telehealth: Payer: Self-pay | Admitting: Family Medicine

## 2023-05-12 NOTE — Telephone Encounter (Signed)
 Form has been placed in your box to be completed, please finish when you have time. Thanks!

## 2023-05-13 ENCOUNTER — Ambulatory Visit: Payer: 59 | Admitting: Physical Therapy

## 2023-05-14 ENCOUNTER — Telehealth (HOSPITAL_COMMUNITY): Payer: Self-pay | Admitting: Psychiatry

## 2023-05-14 NOTE — Telephone Encounter (Signed)
 Patient called States said she is seeing [facility] & they are refusing to give the good pain medication due to her [facility] So the Neuro surgeon told her to please let the call [facility] to give the ok to give the good med. States the test results show that she is in bad pain. Spine & Pain # is [phone]. Thanks!

## 2023-05-15 ENCOUNTER — Encounter: Payer: Self-pay | Admitting: Physical Therapy

## 2023-05-15 ENCOUNTER — Ambulatory Visit: Payer: 59 | Admitting: Physical Therapy

## 2023-05-18 ENCOUNTER — Other Ambulatory Visit: Payer: Self-pay | Admitting: Family Medicine

## 2023-05-18 DIAGNOSIS — I5042 Chronic combined systolic (congestive) and diastolic (congestive) heart failure: Secondary | ICD-10-CM

## 2023-05-20 ENCOUNTER — Ambulatory Visit: Payer: 59 | Admitting: Physical Therapy

## 2023-05-20 ENCOUNTER — Other Ambulatory Visit: Payer: Self-pay

## 2023-05-20 DIAGNOSIS — E039 Hypothyroidism, unspecified: Secondary | ICD-10-CM

## 2023-05-20 MED ORDER — LEVOTHYROXINE SODIUM 300 MCG PO TABS
300.0000 ug | ORAL_TABLET | Freq: Every day | ORAL | 2 refills | Status: DC
Start: 2023-05-20 — End: 2023-11-04

## 2023-05-20 NOTE — Telephone Encounter (Signed)
 Form placed up front for pick up.   Copy made for batch scanning.   Patient aware.

## 2023-05-21 ENCOUNTER — Telehealth (HOSPITAL_COMMUNITY): Payer: 59 | Admitting: Psychiatry

## 2023-05-21 ENCOUNTER — Other Ambulatory Visit: Payer: Self-pay

## 2023-05-21 ENCOUNTER — Telehealth: Payer: Self-pay

## 2023-05-21 ENCOUNTER — Encounter (HOSPITAL_COMMUNITY): Payer: Self-pay | Admitting: Psychiatry

## 2023-05-21 DIAGNOSIS — Z794 Long term (current) use of insulin: Secondary | ICD-10-CM | POA: Diagnosis not present

## 2023-05-21 DIAGNOSIS — E1140 Type 2 diabetes mellitus with diabetic neuropathy, unspecified: Secondary | ICD-10-CM | POA: Diagnosis not present

## 2023-05-21 DIAGNOSIS — F411 Generalized anxiety disorder: Secondary | ICD-10-CM | POA: Diagnosis not present

## 2023-05-21 DIAGNOSIS — F333 Major depressive disorder, recurrent, severe with psychotic symptoms: Secondary | ICD-10-CM | POA: Diagnosis not present

## 2023-05-21 DIAGNOSIS — E039 Hypothyroidism, unspecified: Secondary | ICD-10-CM

## 2023-05-21 MED ORDER — GABAPENTIN 300 MG PO CAPS: 300.0000 mg | ORAL_CAPSULE | Freq: Three times a day (TID) | ORAL | 3 refills | Status: DC

## 2023-05-21 MED ORDER — FLUOXETINE HCL 40 MG PO CAPS: 40.0000 mg | ORAL_CAPSULE | Freq: Every day | ORAL | 3 refills | Status: DC

## 2023-05-21 MED ORDER — HYDROXYZINE HCL 50 MG PO TABS: 50.0000 mg | ORAL_TABLET | Freq: Three times a day (TID) | ORAL | 3 refills | Status: DC | PRN

## 2023-05-21 MED ORDER — QUETIAPINE FUMARATE 100 MG PO TABS
100.0000 mg | ORAL_TABLET | Freq: Every day | ORAL | 3 refills | Status: DC
Start: 2023-05-21 — End: 2023-08-05

## 2023-05-21 MED ORDER — MIRTAZAPINE 15 MG PO TABS: 15.0000 mg | ORAL_TABLET | Freq: Every day | ORAL | 3 refills | Status: DC

## 2023-05-21 NOTE — Progress Notes (Signed)
 [facility] MD/PA/NP OP Progress Note Virtual Visit via Video Note  I connected with [name] on 05/21/23 at  2:30 PM EDT by a video enabled telemedicine application and verified that I am speaking with the correct person using two identifiers.  Location: Patient:Home Provider: Clinic   I discussed the limitations of evaluation and management by telemedicine and the availability of in person appointments. The patient expressed understanding and agreed to proceed.  I provided 30 minutes of non-face-to-face time during this encounter.       05/21/2023 3:15 PM [name]  MRN:  [id number]  Chief Complaint: "I saw a neuro surgeon"  HPI: 53 year old female seen today for follow up psychiatric evaluation. She has a psychiatric history of  OCD, adjustment disorder, anxiety, depression, SI, and bipolar disorder. She is currently being managed on  Prozac 20 mg daily, Seroquel 100 mg nightly, Gabapentin 300 mg three times daily as needed, Vistaril 50 mg three times daily, and mirtazapine 15 mg at bedtime.  She informed Clinical research associate that her other medications are effective in managing her psychiatric conditions.   Today she was well-groomed, pleasant, cooperative, and engaged in conversation. She informed Clinical research associate that recently she saw a Midwife at [facility] [employer]. She reports that she saw Dr. Henri Medal and requested that provider call this provider for continuity of care. She also requested Clinical research associate write a letter regarding her current mental health. Provider was agreeable. Patient was seen at [facility] on 04/24/2023 for worsening sciatica. Patient notes that she has been unable to do physical therapy due to her chronic pain. She reports that she is hopeful that her concerns will be addressed at her upcoming appointment.   She notes that she has an upcoming appointment with [facility] on March the 25th to address her pain.   Patient notes that she wants to live and get her pain well managed. She notes that she is  concerned that her job will no longer be available at [employer] when her FMLA runs out. She reports that she has been there for over 20 years. She did apply for disability and is hopeful that she will get it. Patient notes while hospitalized she notes that she was given a 5 day supply of oxycodone which was effective. She was also given a 30 day supply of Valium 2 mg BID as needed and request that it be continued for muscle spasms. Provider recommended following up with her PCP or pain management to continue this medication. She endorsed understanding.    Since her last she notes that her anxiety and depression has improved. She notes that its a combination of increasing Prozac, and her pain medications.   Today provider conducted a GAD-7 and patient scored a 13, at her last visit she scored an 21.  Provider also conducted PHQ-9 and patient scored a 3, at her last visit she scored a 23.  Today she denies SI/HI/VAH, mania, paranoia.  Today Prozac 20 mg increased to 40 mg to help manage anxiety and depression. She will continue allo there medications as prescribed.  Provider called Dr. Tasia Catchings office and informed him that he can call writer if he has further question. No other concerns noted at this time.     Visit Diagnosis:    ICD-10-CM   1. Generalized anxiety disorder  F41.1 hydrOXYzine (ATARAX) 50 MG tablet    QUEtiapine (SEROQUEL) 100 MG tablet    2. Severe recurrent major depressive disorder with psychotic features (HCC)  F33.3 QUEtiapine (SEROQUEL)  100 MG tablet    3. Type 2 diabetes mellitus with diabetic neuropathy, with long-term current use of insulin (HCC)  E11.40 gabapentin (NEURONTIN) 300 MG capsule   Z79.4             Past Psychiatric History: OCD, adjustment disorder, anxiety, depression, SI, and bipolar disorder.   Past Medical History:  Past Medical History:  Diagnosis Date   Acute renal failure (HCC) 05/27/2010   hemodialysis for 6 weeks   Anxiety     Asthma    Depression    Diabetes mellitus    Hypertension    Rhabdomyolysis 06/06/2010   after drug overdose   Suicide attempt by drug ingestion (HCC) 06/05/2010   result rhabdomyolosis and ARF requrining dialysis    SVT (supraventricular tachycardia) (HCC) 05/28/2021    Past Surgical History:  Procedure Laterality Date   ANKLE ARTHROSCOPY Right 08/17/2015   Procedure: RIGHT ANKLE ARTHROSCOPY WITH SYNOVECTOMY AND LOOSE BODY EXCISION;  Surgeon: Marcene Corning, MD;  Location: [facility] OR;  Service: Orthopedics;  Laterality: Right;   CHOLECYSTECTOMY     TUBAL LIGATION  1998    Family Psychiatric History: Unknown  Family History:  Family History  Problem Relation Age of Onset   Asthma Father     Social History:  Social History   Socioeconomic History   Marital status: Married    Spouse name: Not on file   Number of children: 2   Years of education: Not on file   Highest education level: Not on file  Occupational History   Occupation: Research officer, trade union  Tobacco Use   Smoking status: Never    Passive exposure: Never   Smokeless tobacco: Never  Vaping Use   Vaping status: Never Used  Substance and Sexual Activity   Alcohol use: No   Drug use: No   Sexual activity: Yes    Comment: with husband   Other Topics Concern   Not on file  Social History Narrative   Married high school boyfriend at age 26, two boys, still married but he has been living with other women for years.   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         Social Drivers of Corporate investment banker Strain: Not on file  Food Insecurity: No Food Insecurity (04/25/2023)   Hunger Vital Sign    Worried About Running Out of Food in the Last Year: Never true    Ran Out of Food in the Last Year: Never true  Transportation Needs: No Transportation Needs (04/25/2023)   PRAPARE - Administrator, Civil Service (Medical): No    Lack of Transportation  (Non-Medical): No  Physical Activity: Not on file  Stress: Not on file  Social Connections: Not on file    Allergies:  Allergies  Allergen Reactions   Ace Inhibitors Other (See Comments)    Patient had acute renal failure requiring hemodialysis after a suicide attempt.  Nephro recommended avoiding use.   Haldol [Haloperidol Lactate] Other (See Comments)    Tardive dyskinesia   Nsaids Other (See Comments)    Nephro recommended avoiding after acute renal failure requiring hemodialysis associated with a suicide attempt.   Entresto [Sacubitril-Valsartan] Itching   Latex Other (See Comments)    Rash around IV site    Metabolic Disorder Labs: Lab Results  Component Value Date   HGBA1C 8.6 (A) 03/12/2023   MPG 174.29 07/09/2022   MPG 243.17  11/14/2017   No results found for: "PROLACTIN" Lab Results  Component Value Date   CHOL 201 (H) 04/15/2021   TRIG 231 (H) 04/15/2021   HDL 49 04/15/2021   CHOLHDL 4.1 04/15/2021   VLDL 46 (H) 04/10/2015   LDLCALC 112 (H) 04/15/2021   LDLCALC 125 (H) 12/19/2020   Lab Results  Component Value Date   TSH 1.015 02/20/2022   TSH 2.689 05/19/2021    Therapeutic Level Labs: No results found for: "LITHIUM" No results found for: "VALPROATE" No results found for: "CBMZ"  Current Medications: Current Outpatient Medications  Medication Sig Dispense Refill   albuterol (VENTOLIN HFA) 108 (90 Base) MCG/ACT inhaler Inhale 2 puffs into the lungs every 6 (six) hours as needed for shortness of breath. 18 g 5   blood glucose meter kit and supplies KIT Dispense based on patient and insurance preference. Use up to four times daily as directed. 1 each 0   diazepam (VALIUM) 2 MG tablet Take 1 tablet (2 mg total) by mouth every 12 (twelve) hours as needed for anxiety or muscle spasms. 60 tablet 0   FLUoxetine (PROZAC) 40 MG capsule Take 1 capsule (40 mg total) by mouth daily. 30 capsule 3   fluticasone (FLONASE) 50 MCG/ACT nasal spray Place 1 spray into  both nostrils daily. 1 spray in each nostril every day 16 g 1   fluticasone-salmeterol (WIXELA INHUB) 250-50 MCG/ACT AEPB Inhale 1 puff into the lungs in the morning and at bedtime. (Patient taking differently: Inhale 1 puff into the lungs 2 (two) times daily as needed.) 60 each 1   furosemide (LASIX) 40 MG tablet Take 1 tablet by mouth once daily 30 tablet 0   gabapentin (NEURONTIN) 300 MG capsule Take 1 capsule (300 mg total) by mouth 3 (three) times daily. 90 capsule 3   hydrOXYzine (ATARAX) 50 MG tablet Take 1 tablet (50 mg total) by mouth 3 (three) times daily as needed for anxiety. 90 tablet 3   insulin glargine (LANTUS) 100 UNIT/ML Solostar Pen Inject 65 Units into the skin 2 (two) times daily. 15 mL 2   Insulin Pen Needle 32G X 4 MM MISC 1 each by Does not apply route 3 (three) times daily. 300 each 2   isosorbide-hydrALAZINE (BIDIL) 20-37.5 MG tablet Take 1 tablet by mouth 3 (three) times daily. 90 tablet 0   levothyroxine (SYNTHROID) 300 MCG tablet Take 1 tablet (300 mcg total) by mouth daily at 6 (six) AM. 90 tablet 2   losartan (COZAAR) 25 MG tablet Take 1 tablet (25 mg total) by mouth at bedtime. (Patient not taking: Reported on 04/25/2023) 30 tablet 1   methocarbamol (ROBAXIN) 500 MG tablet Take 2 tablets (1,000 mg total) by mouth 3 (three) times daily. 180 tablet 1   metoprolol succinate (TOPROL-XL) 50 MG 24 hr tablet Take 1 tablet (50 mg total) by mouth at bedtime. Take with or immediately following a meal. (Patient taking differently: Take 50 mg by mouth daily. Take with or immediately following a meal.) 90 tablet 3   mirtazapine (REMERON) 15 MG tablet Take 1 tablet (15 mg total) by mouth at bedtime. Decrease by half in 2 weeks and then discontinue.  Weight gaining medication. 30 tablet 3   NOVOLOG FLEXPEN 100 UNIT/ML FlexPen INJECT 15 UNITS SUBCUTANEOUSLY WITH LUNCH AND INJECT 20 UNITS WITH SUPPER 15 mL 3   pantoprazole (PROTONIX) 40 MG tablet Take 1 tablet (40 mg total) by mouth  daily. 30 tablet 3   QUEtiapine (SEROQUEL) 100 MG  tablet Take 1 tablet (100 mg total) by mouth at bedtime. 30 tablet 3   rosuvastatin (CRESTOR) 20 MG tablet Take 1 tablet (20 mg total) by mouth daily. (Patient not taking: Reported on 04/25/2023) 30 tablet 0   tirzepatide (MOUNJARO) 7.5 MG/0.5ML Pen Inject 7.5 mg into the skin once a week. 3 mL 1   No current facility-administered medications for this visit.     Musculoskeletal: Strength & Muscle Tone: within normal limits, telehealth visit Gait & Station: normal, telehealth visit Patient leans: N/A  Psychiatric Specialty Exam: Review of Systems  There were no vitals taken for this visit.There is no height or weight on file to calculate BMI.  General Appearance: Well Groomed  Eye Contact:  Good  Speech:  Clear and Coherent and Normal Rate  Volume:  Normal  Mood:  Anxious, improving  Affect:  Appropriate and Congruent  Thought Process:  Coherent, Goal Directed and Linear  Orientation:  Full (Time, Place, and Person)  Thought Content: WDL and Logical   Suicidal Thoughts:  No  Homicidal Thoughts:  No  Memory:  Immediate;   Good Recent;   Good Remote;   Good  Judgement:  Good  Insight:  Good  Psychomotor Activity:  Normal  Concentration:  Concentration: Good and Attention Span: Good  Recall:  Good  Fund of Knowledge: Good  Language: Good  Akathisia:  No  Handed:  Right  AIMS (if indicated): Not done  Assets:  Communication Skills Desire for Improvement Financial Resources/Insurance Housing Social Support  ADL's:  Intact  Cognition: WNL  Sleep:  Fair   Screenings: AIMS    Flowsheet Row Admission (Discharged) from 02/24/2022 in [facility]  AIMS Total Score 0      AUDIT    Flowsheet Row Admission (Discharged) from 02/24/2022 in [facility] IN[facility]AVIORAL MEDICINE  Alcohol Use Disorder Identification Test Final Score (AUDIT) 0      GAD-7    Flowsheet Row Video Visit from 05/21/2023 in  [facility] Video Visit from 04/23/2023 in [facility] Video Visit from 02/20/2023 in [facility] Video Visit from 12/24/2022 in [facility] Video Visit from 10/21/2022 in [facility]  Total GAD-7 Score 13 21 8 17 20       PHQ2-9    Flowsheet Row Video Visit from 05/21/2023 in [facility] Video Visit from 04/23/2023 in [facility] Office Visit from 03/12/2023 in [facility] [place] Video Visit from 02[facility]20/2023 in [facility] Office Visit from 12/29/2022 in [facility]  PHQ-2 Total Score 2 6 2 4 6   PHQ-9 Total Score 3 23 8 7 24       Flowsheet Row Video Visit from 05/21/2023 in [facility] ED to Hosp-Admission (Discharged) from 04/24/2023 in [facility] Video Visit from 04/23/2023 in [facility]  C-SSRS RISK CATEGORY High Risk High Risk Error: Q7 should not be populated when Q6 is No        Assessment and Plan: Patient reports that since increasing Prozac her anxiety and depression has improved.  She continues to have chronic pain but has been [facility].  She request that provider speak to Dr. Henri Medal for continuity of care. Provider was  agreeable. Provider called Dr. Tasia Catchings office and informed him that he can call writer if he has further question. Today Prozac 20 mg increased to 40 mg to help manage anxiety and depression. She will continue allo there medications as prescribed.   1. Generalized anxiety disorder  Continue- hydrOXYzine (ATARAX) 50 MG tablet; Take 1 tablet (50 mg total) by mouth 3 (three) times daily as needed for anxiety.  Dispense: 90 tablet; Refill: 3 Continue-  QUEtiapine (SEROQUEL) 100 MG tablet; Take 1 tablet (100 mg total) by mouth at bedtime.  Dispense: 30 tablet; Refill: 3  2. Severe recurrent major depressive disorder with psychotic features (HCC)  Continue- QUEtiapine (SEROQUEL) 100 MG tablet; Take 1 tablet (100 mg total) by mouth at bedtime.  Dispense: 30 tablet; Refill: 3  3. Type 2 diabetes mellitus with diabetic neuropathy, with long-term current use of insulin (HCC)  Continue- gabapentin (NEURONTIN) 300 MG capsule; Take 1 capsule (300 mg total) by mouth 3 (three) times daily.  Dispense: 90 capsule; Refill: 3  Follow-up in 2.5 months  Shanna Cisco, NP 05/21/2023, 3:15 PM

## 2023-05-21 NOTE — Telephone Encounter (Signed)
 Patient calls nurse line in regards to Valium.  She reports she took her last dose on Monday and reports she is now out.   She reports she feels she did better with Ativan 1mg  and is requesting a refill on this.   Appears this was discussed with PCP last month.   Will forward to PCP.

## 2023-05-22 ENCOUNTER — Ambulatory Visit: Payer: 59 | Admitting: Physical Therapy

## 2023-05-22 MED ORDER — LORAZEPAM 1 MG PO TABS: 1.0000 mg | ORAL_TABLET | Freq: Two times a day (BID) | ORAL | 1 refills | Status: DC | PRN

## 2023-05-22 NOTE — Addendum Note (Signed)
 Encounter addended by: Howell Rucks, RDCS on: 05/22/2023 2:36 PM  Actions taken: Imaging Exam ended

## 2023-05-22 NOTE — Addendum Note (Signed)
 Addended by: Elberta Fortis on: 05/22/2023 05:34 PM   Modules accepted: Orders

## 2023-05-25 ENCOUNTER — Telehealth: Payer: Self-pay | Admitting: Family Medicine

## 2023-05-25 NOTE — Telephone Encounter (Signed)
 Son dropped off form at front desk for Disability .  Verified that patient section of form has been completed.  Last DOS/WCC with PCP was 04/28/23.  Placed form in red team folder to be completed by clinical staff.  Vilinda Blanks

## 2023-05-25 NOTE — Telephone Encounter (Signed)
 Placed in MDs to be filled out. Elba Schaber Bruna Potter, CMA

## 2023-05-27 ENCOUNTER — Encounter: Payer: 59 | Admitting: Physical Therapy

## 2023-05-29 ENCOUNTER — Encounter: Payer: 59 | Admitting: Physical Therapy

## 2023-06-01 ENCOUNTER — Telehealth: Payer: Self-pay

## 2023-06-01 ENCOUNTER — Encounter: Payer: Self-pay | Admitting: *Deleted

## 2023-06-01 NOTE — Telephone Encounter (Signed)
 Patient calls nurse line in regards to Levothyroxine.  She reports [facility] needs verbal ok to fill due to manufacture change.   Will forward to PCP.

## 2023-06-01 NOTE — Telephone Encounter (Signed)
 [facility] contacted and gave verbal ok for manufacture change.

## 2023-06-02 ENCOUNTER — Encounter: Payer: Self-pay | Admitting: Neurology

## 2023-06-02 ENCOUNTER — Ambulatory Visit: Payer: BC Managed Care – PPO | Admitting: Neurology

## 2023-06-02 VITALS — BP 106/72 | HR 99 | Ht 71.0 in

## 2023-06-02 DIAGNOSIS — G8929 Other chronic pain: Secondary | ICD-10-CM

## 2023-06-02 DIAGNOSIS — E1122 Type 2 diabetes mellitus with diabetic chronic kidney disease: Secondary | ICD-10-CM | POA: Diagnosis not present

## 2023-06-02 DIAGNOSIS — Z794 Long term (current) use of insulin: Secondary | ICD-10-CM

## 2023-06-02 DIAGNOSIS — M5442 Lumbago with sciatica, left side: Secondary | ICD-10-CM | POA: Diagnosis not present

## 2023-06-02 DIAGNOSIS — M79604 Pain in right leg: Secondary | ICD-10-CM

## 2023-06-02 DIAGNOSIS — G629 Polyneuropathy, unspecified: Secondary | ICD-10-CM | POA: Diagnosis not present

## 2023-06-02 DIAGNOSIS — M79605 Pain in left leg: Secondary | ICD-10-CM

## 2023-06-02 DIAGNOSIS — R29898 Other symptoms and signs involving the musculoskeletal system: Secondary | ICD-10-CM

## 2023-06-02 DIAGNOSIS — M5441 Lumbago with sciatica, right side: Secondary | ICD-10-CM | POA: Diagnosis not present

## 2023-06-02 DIAGNOSIS — M47816 Spondylosis without myelopathy or radiculopathy, lumbar region: Secondary | ICD-10-CM | POA: Diagnosis not present

## 2023-06-02 MED ORDER — BUPIVACAINE HCL 0.5 % IJ SOLN: 3.0000 mL | Freq: Once | INTRAMUSCULAR | Status: DC

## 2023-06-02 MED ORDER — LAMOTRIGINE 25 MG PO TABS: 25.0000 mg | ORAL_TABLET | Freq: Every day | ORAL | 5 refills | Status: DC

## 2023-06-02 MED ORDER — METHYLPREDNISOLONE ACETATE 80 MG/ML IJ SUSP: 80.0000 mg | Freq: Once | INTRAMUSCULAR | Status: AC

## 2023-06-02 NOTE — Patient Instructions (Signed)
 Lamotrigine 25 mg tablets: One a day x 5 days, then Twice a day x 5 days, then Three times a day x 5 days, then Two pills twice a day (4 pills a day)

## 2023-06-02 NOTE — Addendum Note (Signed)
 Addended by: Aura Camps on: 06/02/2023 11:50 AM   Modules accepted: Orders

## 2023-06-02 NOTE — Progress Notes (Signed)
 [facility]  PATIENT: [name] DOB: June 11, 1970  REFERRING DOCTOR OR PCP: Coletta Memos MD; Elberta Fortis, MD SOURCE: Patient, notes from epic, lab and imaging reports, EMG, MRI personally reviewed.  _________________________________   HISTORICAL  CHIEF COMPLAINT:  Chief Complaint  Patient presents with   New Patient (Initial Visit)    Pt in 10 alone Pt nit ablr to sit up straight in chair Pt states not able to stand Pt states lower back pain Pt states numbness,tingling and burning in both legs     HISTORY OF PRESENT ILLNESS:  I had the pleasure of seeing patient, [name], at [facility] for neurologic consultation regarding her back pain and dysesthesias.  She is a 53 year old woman with lower back and leg pain, left > right.   Symptoms started in March 2024 with back pain and pain that would shoot down her leg some.  Then, she began to experience dysesthesias in both feet, possibly unrelated to the back pain..   She feels she can't feel them   dysesthesias are either painful hot or cold sensation.      She is weak in her legs, left > right.  She went from independence to cane to wheelchair over 3 weeks.   She has mostly been wheelchair bound since April 2024.    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  She has seen neurosurgery.    She has had injections for the facet joints (Jul 24, 2022) and SI joints more recently  She has had a few episodes of urinary incontinence when she can't get to the bathroom in time.  However, she always knows that she needs to use the bathroom.  She had an EMG by Dr. Bufford Buttner  01/27/2023.  It showed low amplitudes an mildly delayed latencies for the peroneal and tibial nerves and absent sensory responses.  This would be consistent with a polyneuropathy.  She was admitted to the hospital in February 2025 for a couple days due to her pain.  She is on gabapentin 300 mg po tid and  methocarbamol 500 mg tid  She has had IDDM T2DM x 26 years.   She did not have nerve pain in feet until last year.   She has lost 100 pounds in 2024.   She was on Ozempic and then [facility]  She had no N/V and reports weight loss was over the whole year.    She notes a family history with a sister with MS and a mother with lupus.  IMAGING: MRI of the lumbar spine 02/16/2023 shows the following: At L2-L3, there is left greater than right facet hypertrophy and mild disc bulging but no spinal stenosis or nerve root compression.  At L3-L4, there is facet hypertrophy and disc bulging but no spinal stenosis or nerve root compression.  At L4-L5, there is broad disc protrusion and moderately severe facet hypertrophy with joint effusions.  There is mild foraminal narrowing and moderate right greater than left lateral recess stenosis.  Also mild spinal stenosis.  At L5-S1, there is disc bulging and facet hypertrophy but no foraminal narrowing and mild to moderate lateral recess stenosis.  No nerve root compression.  MRI of the brain 02/16/2023 showed a partially empty sella turcica, couple of small white matter foci consistent with minimal chronic microvascular ischemic change in the cerebellum and the hemisphere.  LABS: May 2024: ESR and CRP were both mildly elevated.  HIV was negative  November 2024: ANA/ANA was negative.  Rheumatoid factor and anti-CCP was negative  January 2025: Hemoglobin A1c is 8.6  REVIEW OF SYSTEMS: Constitutional: No fevers, chills, sweats, or change in appetite Eyes: No visual changes, double vision, eye pain Ear, nose and throat: No hearing loss, ear pain, nasal congestion, sore throat Cardiovascular: No chest pain, palpitations Respiratory:  No shortness of breath at rest or with exertion.   No wheezes GastrointestinaI: No nausea, vomiting, diarrhea, abdominal pain, fecal incontinence Genitourinary:  No dysuria, urinary retention or frequency.  No nocturia. Musculoskeletal:    As aboveas above  Integumentary: No rash, pruritus, skin lesions Neurological: as above Psychiatric: No depression at this time.  No anxiety Endocrine: She has diabetes and is on insulin Hematologic/Lymphatic:  No anemia, purpura, petechiae. Allergic/Immunologic: No itchy/runny eyes, nasal congestion, recent allergic reactions, rashes  ALLERGIES: Allergies  Allergen Reactions   Ace Inhibitors Other (See Comments)    Patient had acute renal failure requiring hemodialysis after a suicide attempt.  Nephro recommended avoiding use.   Haldol [Haloperidol Lactate] Other (See Comments)    Tardive dyskinesia   Nsaids Other (See Comments)    Nephro recommended avoiding after acute renal failure requiring hemodialysis associated with a suicide attempt.   Entresto [Sacubitril-Valsartan] Itching   Latex Other (See Comments)    Rash around IV site    HOME MEDICATIONS:  Current Outpatient Medications:    albuterol (VENTOLIN HFA) 108 (90 Base) MCG/ACT inhaler, Inhale 2 puffs into the lungs every 6 (six) hours as needed for shortness of breath., Disp: 18 g, Rfl: 5   blood glucose meter kit and supplies KIT, Dispense based on patient and insurance preference. Use up to four times daily as directed., Disp: 1 each, Rfl: 0   FLUoxetine (PROZAC) 40 MG capsule, Take 1 capsule (40 mg total) by mouth daily., Disp: 30 capsule, Rfl: 3   fluticasone (FLONASE) 50 MCG/ACT nasal spray, Place 1 spray into both nostrils daily. 1 spray in each nostril every day, Disp: 16 g, Rfl: 1   fluticasone-salmeterol (WIXELA INHUB) 250-50 MCG/ACT AEPB, Inhale 1 puff into the lungs in the morning and at bedtime. (Patient taking differently: Inhale 1 puff into the lungs 2 (two) times daily as needed.), Disp: 60 each, Rfl: 1   furosemide (LASIX) 40 MG tablet, Take 1 tablet by mouth once daily, Disp: 30 tablet, Rfl: 0   gabapentin (NEURONTIN) 300 MG capsule, Take 1 capsule (300 mg total) by mouth 3 (three) times daily., Disp: 90  capsule, Rfl: 3   hydrOXYzine (ATARAX) 50 MG tablet, Take 1 tablet (50 mg total) by mouth 3 (three) times daily as needed for anxiety., Disp: 90 tablet, Rfl: 3   insulin glargine (LANTUS) 100 UNIT/ML Solostar Pen, Inject 65 Units into the skin 2 (two) times daily., Disp: 15 mL, Rfl: 2   Insulin Pen Needle 32G X 4 MM MISC, 1 each by Does not apply route 3 (three) times daily., Disp: 300 each, Rfl: 2   isosorbide-hydrALAZINE (BIDIL) 20-37.5 MG tablet, Take 1 tablet by mouth 3 (three) times daily., Disp: 90 tablet, Rfl: 0   lamoTRIgine (LAMICTAL) 25 MG tablet, Take 1 tablet (25 mg total) by mouth daily. One po every day x 5 days, then one po bid x 5 days, then one po tid x 5 days, then 2 po bid, Disp: 120 tablet, Rfl: 5   levothyroxine (SYNTHROID) 300 MCG tablet, Take 1 tablet (300 mcg total) by mouth daily at 6 (six) AM., Disp: 90 tablet, Rfl:  2   LORazepam (ATIVAN) 1 MG tablet, Take 1 tablet (1 mg total) by mouth 2 (two) times daily as needed for anxiety., Disp: 45 tablet, Rfl: 1   losartan (COZAAR) 25 MG tablet, Take 1 tablet (25 mg total) by mouth at bedtime., Disp: 30 tablet, Rfl: 1   methocarbamol (ROBAXIN) 500 MG tablet, Take 2 tablets (1,000 mg total) by mouth 3 (three) times daily., Disp: 180 tablet, Rfl: 1   metoprolol succinate (TOPROL-XL) 50 MG 24 hr tablet, Take 1 tablet (50 mg total) by mouth at bedtime. Take with or immediately following a meal. (Patient taking differently: Take 50 mg by mouth daily. Take with or immediately following a meal.), Disp: 90 tablet, Rfl: 3   mirtazapine (REMERON) 15 MG tablet, Take 1 tablet (15 mg total) by mouth at bedtime. Decrease by half in 2 weeks and then discontinue.  Weight gaining medication., Disp: 30 tablet, Rfl: 3   NOVOLOG FLEXPEN 100 UNIT/ML FlexPen, INJECT 15 UNITS SUBCUTANEOUSLY WITH LUNCH AND INJECT 20 UNITS WITH SUPPER, Disp: 15 mL, Rfl: 3   pantoprazole (PROTONIX) 40 MG tablet, Take 1 tablet (40 mg total) by mouth daily., Disp: 30 tablet, Rfl:  3   QUEtiapine (SEROQUEL) 100 MG tablet, Take 1 tablet (100 mg total) by mouth at bedtime., Disp: 30 tablet, Rfl: 3   rosuvastatin (CRESTOR) 20 MG tablet, Take 1 tablet (20 mg total) by mouth daily., Disp: 30 tablet, Rfl: 0   tirzepatide (MOUNJARO) 7.5 MG/0.5ML Pen, Inject 7.5 mg into the skin once a week., Disp: 3 mL, Rfl: 1  PAST MEDICAL HISTORY: Past Medical History:  Diagnosis Date   Acute renal failure (HCC) 05/27/2010   hemodialysis for 6 weeks   Anxiety    Asthma    Depression    Diabetes mellitus    Hypertension    Rhabdomyolysis 06/06/2010   after drug overdose   Suicide attempt by drug ingestion (HCC) 06/05/2010   result rhabdomyolosis and ARF requrining dialysis    SVT (supraventricular tachycardia) (HCC) 05/28/2021    PAST SURGICAL HISTORY: Past Surgical History:  Procedure Laterality Date   ANKLE ARTHROSCOPY Right 08/17/2015   Procedure: RIGHT ANKLE ARTHROSCOPY WITH SYNOVECTOMY AND LOOSE BODY EXCISION;  Surgeon: Marcene Corning, MD;  Location: [facility] OR;  Service: Orthopedics;  Laterality: Right;   CHOLECYSTECTOMY     TUBAL LIGATION  1998    FAMILY HISTORY: Family History  Problem Relation Age of Onset   Asthma Father     SOCIAL HISTORY: Social History   Socioeconomic History   Marital status: Married    Spouse name: Not on file   Number of children: 2   Years of education: Not on file   Highest education level: Not on file  Occupational History   Occupation: Research officer, trade union  Tobacco Use   Smoking status: Never    Passive exposure: Never   Smokeless tobacco: Never  Vaping Use   Vaping status: Never Used  Substance and Sexual Activity   Alcohol use: No   Drug use: No   Sexual activity: Yes    Comment: with husband   Other Topics Concern   Not on file  Social History Narrative   Married high school boyfriend at age 62, two boys, still married but he has been living with other women for years.She works 2 jobs, as a Research officer, 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   Pt not working    Pt lives with family    Social Drivers of  Health   Financial Resource Strain: Not on file  Food Insecurity: No Food Insecurity (04/25/2023)   Hunger Vital Sign    Worried About Running Out of Food in the Last Year: Never true    Ran Out of Food in the Last Year: Never true  Transportation Needs: No Transportation Needs (04/25/2023)   PRAPARE - Administrator, Civil Service (Medical): No    Lack of Transportation (Non-Medical): No  Physical Activity: Not on file  Stress: Not on file  Social Connections: Not on file  Intimate Partner Violence: Not At Risk (04/25/2023)   Humiliation, Afraid, Rape, and Kick questionnaire    Fear of Current or Ex-Partner: No    Emotionally Abused: No    Physically Abused: No    Sexually Abused: No       PHYSICAL EXAM  Vitals:   06/02/23 1022  BP: 106/72  Pulse: 99  Height: 5\' 11"  (1.803 m)    Body mass index is 44.32 kg/m.   General: The patient is well-developed and well-nourished and in mild distress, moaning at times  HEENT:  Head is [place]/AT.  Sclera are anicteric.   Neck: No carotid bruits are noted.  The neck is nontender.  Cardiovascular: The heart has a regular rate and rhythm with a normal S1 and S2. There were no murmurs, gallops or rubs.    Skin: Extremities are without rash or  edema.  Musculoskeletal: Back is tender over the L4-L5 paraspinal muscles and over the piriformis muscles  Neurologic Exam  Mental status: The patient is alert and oriented x 3 at the time of the examination. The patient has apparent normal recent and remote memory, with an apparently normal attention span and concentration ability.   Speech is normal.  Cranial nerves: Extraocular movements are full.  There is good facial sensation to soft touch bilaterally.Facial strength is normal.  Trapezius and sternocleidomastoid strength is normal. No dysarthria is noted.  The tongue is midline, and  the patient has symmetric elevation of the soft palate. No obvious hearing deficits are noted.  Motor:  Muscle bulk is normal.   Tone is normal. Strength is difficult to assess due to pain.  She is at least 4+/5 in the proximal leg and at least 4/5 in the toes  Sensory: She has normal sensation in the arms and proximal legs.  Vibration sensation was normal at the knees, 10% at the ankles and absent at the toes.  Pinprick sensation was normal just below the knee.  With a gradient it reduced to near absent in the toes.  She had more numbness in the L5 distribution then in the S1 distributions of both feet.  Sensation was fairly symmetric.  Coordination: Cerebellar testing reveals good finger-nose-finger and heel-to-shin bilaterally.  Gait and station: She uses bilateral support to stand up and leans forward when she takes a couple steps.  She can transfer independently.  Reflexes: Deep tendon reflexes are symmetric and normal in arms, trace at the knees and absent at the ankles..   Plantar responses are flexor.    DIAGNOSTIC DATA (LABS, IMAGING, TESTING) - I reviewed patient records, labs, notes, testing and imaging myself where available.  Lab Results  Component Value Date   WBC 7.9 04/24/2023   HGB 11.7 (L) 04/24/2023   HCT 37.4 04/24/2023   MCV 86.4 04/24/2023   PLT 521 (H) 04/24/2023      Component Value Date/Time   NA 143 04/28/2023 1535   K 5.1 04/28/2023  1535   CL 105 04/28/2023 1535   CO2 21 04/28/2023 1535   GLUCOSE 114 (H) 04/28/2023 1535   GLUCOSE 151 (H) 04/26/2023 0702   BUN 38 (H) 04/28/2023 1535   CREATININE 2.16 (H) 04/28/2023 1535   CREATININE 1.32 (H) 04/10/2015 1656   CALCIUM 9.3 04/28/2023 1535   PROT 7.4 07/12/2022 1638   PROT 7.6 10/01/2021 1212   ALBUMIN 3.7 07/12/2022 1638   ALBUMIN 4.2 10/01/2021 1212   AST 29 07/12/2022 1638   ALT 25 07/12/2022 1638   ALKPHOS 54 07/12/2022 1638   BILITOT 1.0 07/12/2022 1638   BILITOT 0.2 10/01/2021 1212   GFRNONAA  27 (L) 04/26/2023 0702   GFRNONAA 49 (L) 04/10/2015 1656   GFRAA 54 (L) 07/25/2019 1746   GFRAA 56 (L) 04/10/2015 1656   Lab Results  Component Value Date   CHOL 201 (H) 04/15/2021   HDL 49 04/15/2021   LDLCALC 112 (H) 04/15/2021   LDLDIRECT 108 (H) 02/16/2013   TRIG 231 (H) 04/15/2021   CHOLHDL 4.1 04/15/2021   Lab Results  Component Value Date   HGBA1C 8.6 (A) 03/12/2023   Lab Results  Component Value Date   VITAMINB12 197 (L) 06/08/2010   Lab Results  Component Value Date   TSH 1.015 02/20/2022       ASSESSMENT AND PLAN  Polyneuropathy - Plan: Multiple Myeloma Panel (SPEP&IFE w/QIG), Vitamin B12, Sedimentation rate, Vitamin B1, Copper, serum, C-reactive protein, Sjogren's syndrome antibods(ssa + ssb)  Type 2 diabetes mellitus with chronic kidney disease, with long-term current use of insulin, unspecified CKD stage (HCC)  Chronic bilateral low back pain with left-sided sciatica  Chronic bilateral low back pain with bilateral sciatica  Facet hypertrophy of lumbar region    In summary, [name] is a 53 year old woman who developed severe back pain sometimes with sciatic type pain who also reports constant painful dysesthesias in her feet.  The back pain could be related to her severe facet hypertrophy at L4-L5.  She did not think she had any benefit from a joint injection, however.  I am uncertain if she would get any benefit from medial branch blocks/RFA.  I did go ahead and do a trigger point injection with 80 mg Depo-Medrol and 3 cc Marcaine into the paraspinal muscles (bilateral L4 and L5) in the hopes that this would reduce some of her current pain.  She tolerated the procedure well there were no complications.  The dysesthetic foot pain is more likely to be due to a painful polyneuropathy.  She has features of both small and large fiber involvement.  I will do some blood work to further evaluate this.  Additionally, I started lamotrigine and will titrate up to  50 mg twice daily and possibly to a higher dose depending on results.  Additionally she will continue gabapentin.  I did not schedule follow-up but 1 may be needed based on the results of the studies.  She should call with any severe new or worsening neurologic symptoms.   I did discuss that we are not a chronic pain clinic and that if symptoms persist she may need referral to 1 for opiate management   Zylee Marchiano A. Epimenio Foot, MD, [facility] 06/02/2023, 11:16 AM Certified in Neurology, Clinical Neurophysiology, Sleep Medicine and Neuroimaging  [facility] [address], [address] [place], [place] [address] [phone]

## 2023-06-03 NOTE — Addendum Note (Signed)
 Addended by: Asa Lente on: 06/03/2023 12:34 PM   Modules accepted: Orders

## 2023-06-03 NOTE — Telephone Encounter (Signed)
error 

## 2023-06-03 NOTE — Addendum Note (Signed)
 Addended by: Asa Lente on: 06/03/2023 12:18 PM   Modules accepted: Orders

## 2023-06-04 ENCOUNTER — Telehealth: Payer: Self-pay | Admitting: Family Medicine

## 2023-06-04 ENCOUNTER — Telehealth: Payer: Self-pay

## 2023-06-04 DIAGNOSIS — Z5986 Financial insecurity: Secondary | ICD-10-CM

## 2023-06-04 DIAGNOSIS — F332 Major depressive disorder, recurrent severe without psychotic features: Secondary | ICD-10-CM

## 2023-06-04 DIAGNOSIS — G8929 Other chronic pain: Secondary | ICD-10-CM

## 2023-06-04 NOTE — Telephone Encounter (Signed)
 Placed in MDs box to be filled out. Dalon Reichart Bruna Potter, CMA

## 2023-06-04 NOTE — Telephone Encounter (Signed)
 Released med records to go along with form.  Contacted patient to inform her the forms are ready to be picked up.   Place in folder at front desk.

## 2023-06-04 NOTE — Telephone Encounter (Signed)
 Patient calls nurse line very tearful and frustrated.   She reports [employer] is only holding her job until June. She reports she is scared she will lose her job and will become homeless.   She reports her pain has been on going for one year and she feels no one has been able to help her.  She reports she keeps getting referred to all of these places, however she has no more money for copays.   She has an apt with PCP scheduled for April.   She is requesting a referral to social work to help navigate this all.   Advised will forward to PCP.

## 2023-06-05 ENCOUNTER — Encounter: Payer: Self-pay | Admitting: *Deleted

## 2023-06-08 ENCOUNTER — Encounter: Payer: Self-pay | Admitting: *Deleted

## 2023-06-08 ENCOUNTER — Telehealth: Payer: Self-pay | Admitting: *Deleted

## 2023-06-08 ENCOUNTER — Other Ambulatory Visit: Payer: Self-pay | Admitting: Neurology

## 2023-06-08 DIAGNOSIS — E538 Deficiency of other specified B group vitamins: Secondary | ICD-10-CM

## 2023-06-08 LAB — VITAMIN B1: Thiamine: 116.8 nmol/L (ref 66.5–200.0)

## 2023-06-08 LAB — SJOGREN'S SYNDROME ANTIBODS(SSA + SSB)
ENA SSA (RO) Ab: <0.2 AI (ref 0.0–0.9)
ENA SSB (LA) Ab: <0.2 AI (ref 0.0–0.9)

## 2023-06-08 LAB — MULTIPLE MYELOMA PANEL, SERUM
Albumin SerPl Elph-Mcnc: 3.7 g/dL (ref 2.9–4.4)
Albumin/Glob SerPl: 1 (ref 0.7–1.7)
Alpha 1: 0.2 g/dL (ref 0.0–0.4)
Alpha2 Glob SerPl Elph-Mcnc: 0.9 g/dL (ref 0.4–1.0)
B-Globulin SerPl Elph-Mcnc: 1.4 g/dL — ABNORMAL HIGH (ref 0.7–1.3)
Gamma Glob SerPl Elph-Mcnc: 1.5 g/dL (ref 0.4–1.8)
Globulin, Total: 4 g/dL — ABNORMAL HIGH (ref 2.2–3.9)
IgA/Immunoglobulin A, Serum: 484 mg/dL — ABNORMAL HIGH (ref 87–352)
IgG (Immunoglobin G), Serum: 1576 mg/dL (ref 586–1602)
IgM (Immunoglobulin M), Srm: 72 mg/dL (ref 26–217)
Total Protein: 7.7 g/dL (ref 6.0–8.5)

## 2023-06-08 LAB — COPPER, SERUM: Copper: 112 ug/dL (ref 80–158)

## 2023-06-08 LAB — VITAMIN B12: Vitamin B-12: 157 pg/mL — ABNORMAL LOW (ref 232–1245)

## 2023-06-08 LAB — C-REACTIVE PROTEIN: CRP: 31 mg/L — ABNORMAL HIGH (ref 0–10)

## 2023-06-08 LAB — SEDIMENTATION RATE: Sed Rate: 47 mm/h — ABNORMAL HIGH (ref 0–40)

## 2023-06-08 NOTE — Progress Notes (Unsigned)
 Complex Care Management Note Care Guide Note  06/08/2023 Name: GINIA RUDELL MRN: [id number] DOB: [birth year]   Complex Care Management Outreach Attempts: An unsuccessful telephone outreach was attempted today to offer the patient information about available complex care management services.  Follow Up Plan:  Additional outreach attempts will be made to offer the patient complex care management information and services.   Encounter Outcome:  No Answer  Gwenevere Ghazi  [facility]  [facility] Guide  Direct Dial: [phone]  Fax [phone]

## 2023-06-09 NOTE — Telephone Encounter (Signed)
 Patient called and informed that forms are ready for pick up. Copy made and placed in batch scanning. Original placed at front desk for pick up.   Veronda Prude, RN

## 2023-06-11 ENCOUNTER — Telehealth: Payer: Self-pay

## 2023-06-11 NOTE — Progress Notes (Signed)
   Telephone encounter was:  Unsuccessful.  06/11/2023 Name: NIANI MOURER MRN: [id number] DOB: Dec 17, 1970  Unsuccessful outbound call made today to assist with:  Food Insecurity and Financial Difficulties related to Financial strain an Housing   Outreach Attempt:  1st Attempt  Unable to leave a message    Lenard Forth [facility]  [facility] Guide, Phone: [phone] Fax: [phone] Website: [email or web address]

## 2023-06-11 NOTE — Progress Notes (Signed)
 Complex Care Management Note  Care Guide Note 06/11/2023 Name: [name] MRN: [id number] DOB: 06-Mar-1971  [name] is a 53 y.o. year old female who sees Elberta Fortis, MD for primary care. I reached out to [name] by phone today to offer complex care management services.  [name] was given information about Complex Care Management services today including:   The Complex Care Management services include support from the care team which includes your Nurse Care Manager, Clinical Social Worker, or Pharmacist.  The Complex Care Management team is here to help remove barriers to the health concerns and goals most important to you. Complex Care Management services are voluntary, and the patient may decline or stop services at any time by request to their care team member.   Complex Care Management Consent Status: Patient agreed to services and verbal consent obtained.   Follow up plan:  Telephone appointment with complex care management team member scheduled for:  4/9  Encounter Outcome:  Patient Scheduled Routed community resources care guides referral for SDOH needs.  Gwenevere Ghazi  [facility]  [facility] Guide  Direct Dial: [phone]  Fax [phone]

## 2023-06-12 ENCOUNTER — Telehealth: Payer: Self-pay

## 2023-06-12 NOTE — Progress Notes (Signed)
   Telephone encounter was:  Unsuccessful.  06/12/2023 Name: [name] MRN: [id number] DOB: September 30, 1970  Unsuccessful outbound call made today to assist with:  Food Insecurity and Financial Difficulties related to financial strain  Outreach Attempt:  2nd Attempt  A HIPAA compliant voice message was left requesting a return call.  Instructed patient to call back    Lenard Forth [facility] Guide, Phone: [phone] Fax: [phone] Website: [email or web address]

## 2023-06-15 ENCOUNTER — Telehealth: Payer: Self-pay | Admitting: Neurology

## 2023-06-15 DIAGNOSIS — E1140 Type 2 diabetes mellitus with diabetic neuropathy, unspecified: Secondary | ICD-10-CM

## 2023-06-15 MED ORDER — GABAPENTIN 300 MG PO CAPS
ORAL_CAPSULE | ORAL | 3 refills | Status: DC
Start: 2023-06-15 — End: 2023-07-01

## 2023-06-15 NOTE — Telephone Encounter (Signed)
 Called and spoke w/ pt. Relayed Dr. Bonnita Hollow recommendation. She is agreeable to plan. I called in new rx for increased dose of gabapentin. Asked lamotrigine be d/c'd. She will call if this is ineffective to discuss alternate plan.

## 2023-06-15 NOTE — Telephone Encounter (Signed)
 Pt said,have taken lamoTRIgine (LAMICTAL) 25 MG tablet first day take once a day for 5 days, then 1 tablet twice a day for 5 days, then 1 tablet 3 times day for 5 day, then 2 tablets twice a day now experiencing a side effect. Nerves inside of leg shooting off like fireworks, feet feel like wrapped in bobbed wire and feels like feet are being pulled up even when walking. Afraid to keep taking the medication, need to call in something else.

## 2023-06-15 NOTE — Telephone Encounter (Signed)
 Dr. Epimenio Foot- what would you recommend?  Pt last saw Dr. Epimenio Foot 06/02/23 and next f/u 07/14/23.   Assessment/plan:

## 2023-06-16 ENCOUNTER — Telehealth: Payer: Self-pay

## 2023-06-16 NOTE — Progress Notes (Signed)
   Telephone encounter was:  Unsuccessful.  06/16/2023 Name: [name] MRN: [id number] DOB: 1970/11/12  Unsuccessful outbound call made today to assist with:  Food Insecurity and Financial Difficulties related to Financial strain  Outreach Attempt:  3rd Attempt.  Referral closed unable to contact patient.  A HIPAA compliant voice message was left requesting a return call.  Instructed patient to call back    Lenard Forth [facility] Guide, Phone: [phone] Fax: [phone] Website: [email or web address]

## 2023-06-17 ENCOUNTER — Other Ambulatory Visit: Payer: Self-pay | Admitting: Family Medicine

## 2023-06-17 ENCOUNTER — Other Ambulatory Visit: Payer: Self-pay

## 2023-06-17 DIAGNOSIS — I5042 Chronic combined systolic (congestive) and diastolic (congestive) heart failure: Secondary | ICD-10-CM

## 2023-06-17 NOTE — Patient Instructions (Signed)
 Visit Information  Thank you for taking time to visit with me today. Please don't hesitate to contact me if I can be of assistance to you before our next scheduled appointment.  Our next appointment is by telephone on Tuesday, April 15th at 2;45pm.  Please call the care guide team at [phone] if you need to cancel or reschedule your appointment.   Following is a copy of your care plan:   Goals Addressed             This Visit's Progress    VBCI RN Care Plan       Problems:  Care Coordination needs related to Financial Strain  and Transportation Chronic Disease Management support and education needs related to Lower back/bilateral hip/bilateral leg/bilateral feet PAIN  Goal: Over the next 6 months the Patient will work with community resource care guide to address needs related to Financial constraints related to co-pays for Specialists and Transportation as evidenced by patient and/or community resource care guide support    verbalize a decrease in pain levels as evidenced by reporting pain of 5 or less.    Interventions:   Pain Interventions:  (Status:  New goal.) Long Term Goal Pain assessment performed Medications reviewed Discussed importance of adherence to all scheduled medical appointments Counseled on the importance of reporting any/all new or changed pain symptoms or management strategies to pain management provider Reviewed with patient prescribed pharmacological and nonpharmacological pain relief strategies Advised patient to discuss being referred to the correct specialist to address pain with provider Assessed social determinant of health barriers  Patient Self-Care Activities:  Call provider office for new concerns or questions  Discuss with PCP on appt scheduled 06/18/23 regarding need to be referred to correct specialist due to not able to keep paying co-pays to MD's that can't help her. She will be prepared to remind PCP of last MRI, xray, plan from Dr.  Bonnita Hollow visit where he started her on Vitamin D and Vitamin B12 and his order to take future labs - she is to ask if PCP can obtain the labs and treat as necessary to avoid another co-pay to a specialist.   Plan:  Telephone follow up appointment with care management team member scheduled for:  Tuesday, April 15 at 2:45pm.              Please call the Suicide and Crisis Lifeline: 988 if you are experiencing a Mental Health or Behavioral Health Crisis or need someone to talk to.  Patient verbalizes understanding of instructions and care plan provided today and agrees to view in MyChart. Active MyChart status and patient understanding of how to access instructions and care plan via MyChart confirmed with patient.     Jeani Hawking BSN, CCM [place]  VBCI Population Health RN Care Manager Direct Dial: [phone]  Fax: [phone]

## 2023-06-17 NOTE — Patient Outreach (Signed)
 Complex Care Management   Visit Note  06/17/2023  Name:  [name] MRN: [id number] DOB: 1971-01-15  Situation: Referral received for Complex Care Management related to SDOH Barriers:  Transportation Financial Resource Strain and Severe back/hip/leg/feet PAIN  I obtained verbal consent from patient.  Visit completed with patient  on the phone  Background:   Past Medical History:  Diagnosis Date   Acute renal failure (HCC) 05/27/2010   hemodialysis for 6 weeks   Anxiety    Asthma    Depression    Diabetes mellitus    Hypertension    Rhabdomyolysis 06/06/2010   after drug overdose   Suicide attempt by drug ingestion (HCC) 06/05/2010   result rhabdomyolosis and ARF requrining dialysis    SVT (supraventricular tachycardia) (HCC) 05/28/2021    Assessment: Patient Reported Symptoms:  Cognitive Alert and oriented to person, place, and time  Neurological      HEENT      Cardiovascular      Respiratory      Endocrine No symptoms reported    Gastrointestinal      Genitourinary      Integumentary      Musculoskeletal Difficulty walking (Patient is in the process of finding the right MD to treat her back/hip/leg/feet pain.  Rates pain 10/10.  Dr. Emelda Brothers, increased gabapentin on 06/16/23, she will start new dosing today.)    Psychosocial       There were no vitals filed for this visit.  Medications Reviewed Today     Reviewed by Sherrye Payor, RN (Registered Nurse) on 06/17/23 at 1500  Med List Status: <None>   Medication Order Taking? Sig Documenting Provider Last Dose Status Informant  albuterol (VENTOLIN HFA) 108 (90 Base) MCG/ACT inhaler [id number] Yes Inhale 2 puffs into the lungs every 6 (six) hours as needed for shortness of breath. Elberta Fortis, MD Taking Active Self  blood glucose meter kit and supplies KIT [id number] Yes Dispense based on patient and insurance preference. Use up to four times daily as directed. Sabino Dick, DO Taking  Active Self  bupivacaine (MARCAINE) 0.5 % (with pres) injection 3 mL [id number]   Asa Lente, MD  Active   FLUoxetine (PROZAC) 40 MG capsule [id number] Yes Take 1 capsule (40 mg total) by mouth daily. Shanna Cisco, NP Taking Active   fluticasone (FLONASE) 50 MCG/ACT nasal spray [id number] Yes Place 1 spray into both nostrils daily. 1 spray in each nostril every day Sabino Dick, DO Taking Active Self           Med Note Cyndie Chime, [facility]   Sat Apr 25, 2023  4:55 AM)    fluticasone-salmeterol [facility] INHUB) 250-50 MCG/ACT AEPB [id number] Yes Inhale 1 puff into the lungs in the morning and at bedtime.  Patient taking differently: Inhale 1 puff into the lungs 2 (two) times daily as needed.   Elberta Fortis, MD Taking Active Self  furosemide (LASIX) 40 MG tablet [id number] Yes Take 1 tablet by mouth once daily Elberta Fortis, MD Taking Active   gabapentin (NEURONTIN) 300 MG capsule [id number] Yes Take 1 capsule in the morning, 2 capsules in the late afternoon and 2 capsules at bedtime Sater, Pearletha Furl, MD Taking Active   hydrOXYzine (ATARAX) 50 MG tablet [id number] Yes Take 1 tablet (50 mg total) by mouth 3 (three) times daily as needed for anxiety. Shanna Cisco, NP Taking Active   insulin glargine (LANTUS) 100 UNIT/ML Solostar Pen [id number] Yes Inject 65 Units into the skin  2 (two) times daily. Elberta Fortis, MD Taking Active Self  Insulin Pen Needle 32G X 4 MM MISC [id number] Yes 1 each by Does not apply route 3 (three) times daily. Sabino Dick, DO Taking Active Self   Patient taking differently:   Discontinued 05/21/21 1320 (Reorder) isosorbide-hydrALAZINE (BIDIL) 20-37.5 MG tablet [id number] Yes Take 1 tablet by mouth 3 (three) times daily. Elberta Fortis, MD Taking Active Self  levothyroxine (SYNTHROID) 300 MCG tablet [id number] Yes Take 1 tablet (300 mcg total) by mouth daily at 6 (six) AM. Elberta Fortis, MD Taking Active   LORazepam (ATIVAN) 1 MG tablet  [id number] Yes Take 1 tablet (1 mg total) by mouth 2 (two) times daily as needed for anxiety. Elberta Fortis, MD Taking Active   losartan (COZAAR) 25 MG tablet [id number] Yes Take 1 tablet (25 mg total) by mouth at bedtime. Elberta Fortis, MD Taking Active Self  methocarbamol (ROBAXIN) 500 MG tablet [id number] Yes Take 2 tablets (1,000 mg total) by mouth 3 (three) times daily. Elberta Fortis, MD Taking Active Self  methylPREDNISolone acetate (DEPO-MEDROL) injection 80 mg [id number]   Asa Lente, MD  Active   metoprolol succinate (TOPROL-XL) 50 MG 24 hr tablet [id number] Yes Take 1 tablet (50 mg total) by mouth at bedtime. Take with or immediately following a meal.  Patient taking differently: Take 50 mg by mouth daily. Take with or immediately following a meal.   Elberta Fortis, MD Taking Active Self  mirtazapine (REMERON) 15 MG tablet [id number] Yes Take 1 tablet (15 mg total) by mouth at bedtime. Decrease by half in 2 weeks and then discontinue.  Weight gaining medication. Shanna Cisco, NP Taking Active   NOVOLOG FLEXPEN 100 UNIT/ML FlexPen [id number] Yes INJECT 15 UNITS SUBCUTANEOUSLY WITH LUNCH AND INJECT 20 UNITS WITH SUPPER Elberta Fortis, MD Taking Active Self  pantoprazole (PROTONIX) 40 MG tablet [id number] Yes Take 1 tablet (40 mg total) by mouth daily. Elberta Fortis, MD Taking Active   QUEtiapine (SEROQUEL) 100 MG tablet [id number] Yes Take 1 tablet (100 mg total) by mouth at bedtime. Shanna Cisco, NP Taking Active   rosuvastatin (CRESTOR) 20 MG tablet [id number] Yes Take 1 tablet (20 mg total) by mouth daily. Clapacs, Jackquline Denmark, MD Taking Active Self  tirzepatide [facility]) 7.5 MG/0.5ML Pen [id number] Yes Inject 7.5 mg into the skin once a week. Elberta Fortis, MD Taking Active             Recommendation:   PCP Follow-up Specialty provider follow-up with Pain Management  Follow up with [facility] Guide to address financial strain due to specialist  co-pays  Follow Up Plan:   Telephone follow up appointment date/time:  Tuesday, April 15th at 2:45pm to assess plan from PCP visit on 06/18/23 and finish Assessment.    Jeani Hawking BSN, CCM [place]  VBCI Population Health RN Care Manager Direct Dial: [phone]  Fax: [phone]

## 2023-06-18 ENCOUNTER — Ambulatory Visit: Admitting: Student

## 2023-06-18 ENCOUNTER — Telehealth: Payer: Self-pay | Admitting: *Deleted

## 2023-06-18 NOTE — Progress Notes (Signed)
 Complex Care Management Note Care Guide Note  06/18/2023 Name: TAMMELA BALES MRN: [id number] DOB: 09-09-1970   Complex Care Management Outreach Attempts: An unsuccessful telephone outreach was attempted today to offer the patient information about available complex care management services.  Follow Up Plan:  Additional outreach attempts will be made to offer the patient complex care management information and services.   Encounter Outcome:  No Answer  [facility] Health[facility] Care Guide  Direct Dial:[phone] Fax:[phone] Website: [email or web address]

## 2023-06-18 NOTE — Progress Notes (Deleted)
    SUBJECTIVE:   CHIEF COMPLAINT / HPI:   ***  PERTINENT  PMH / PSH: T2DM, CKD 3b, HTN, asthma, CHF, GAD  OBJECTIVE:   There were no vitals taken for this visit. ***  General: NAD, pleasant, able to participate in exam Cardiac: RRR, no murmurs. Respiratory: CTAB, normal effort, No wheezes, rales or rhonchi Abdomen: Bowel sounds present, nontender, nondistended, no hepatosplenomegaly. Extremities: no edema or cyanosis. Skin: warm and dry, no rashes noted Neuro: alert, no obvious focal deficits Psych: Normal affect and mood  ASSESSMENT/PLAN:   No problem-specific Assessment & Plan notes found for this encounter.     Dr. Erick Alley, DO [facility]    {    This will disappear when note is signed, click to select method of visit    :1}

## 2023-06-19 ENCOUNTER — Telehealth: Payer: Self-pay | Admitting: *Deleted

## 2023-06-19 NOTE — Progress Notes (Signed)
 Complex Care Management Note Care Guide Note  06/19/2023 Name: RAMSHA LONIGRO MRN: [id number] DOB: Nov 02, 1970  GLENDIA OLSHEFSKI is a 53 y.o. year old female who is a primary care patient of Elberta Fortis, MD . The community resource team was consulted for assistance with Transportation Needs , Food Insecurity, and Financial Difficulties related to housing   SDOH screenings and interventions completed:  Yes  Social Drivers of Health From This Encounter   Financial Resource Strain: High Risk (06/19/2023)   Overall Financial Resource Strain (CARDIA)    Difficulty of Paying Living Expenses: Very hard    SDOH Interventions Today    Flowsheet Row Most Recent Value  SDOH Interventions   Food Insecurity Interventions Community Resources Provided  [employer] email the list]  Housing Interventions Community Resources Provided  [employer] mail housing lists to her  encouraged her toget on waitlists]  Transportation Interventions [employer] ([employer])  [Will email gso access application]  Financial Strain Interventions Community Resources Provided  [place] apply for [employer] and will email [place] application financial assistance]        Care guide performed the following interventions: Patient provided with information about care guide support team and interviewed to confirm resource needs. Will email patient all the information requested   Follow Up Plan:  No further follow up planned at this time. The patient has been provided with needed resources.  Encounter Outcome:  Patient Visit Completed Dione Booze  [facility] HealthPopulation Health Care Guide  Direct Dial:[phone] Fax:[phone] Website: [email or web address]

## 2023-06-21 ENCOUNTER — Other Ambulatory Visit: Payer: Self-pay | Admitting: Family Medicine

## 2023-06-21 DIAGNOSIS — G8929 Other chronic pain: Secondary | ICD-10-CM

## 2023-06-23 ENCOUNTER — Other Ambulatory Visit: Payer: Self-pay

## 2023-06-24 NOTE — Progress Notes (Signed)
 Virtual Visit via Telephone Note  I connected with [name] on 06/25/23 at  2:50 PM EDT by telephone and verified that I am speaking with the correct person using two identifiers.  Location: Patient: [name] - Home Provider: Jonne Netters, DO - [facility]   I discussed the limitations, risks, security and privacy concerns of performing an evaluation and management service by telephone and the availability of in person appointments. I also discussed with the patient that there may be a patient responsible charge related to this service. The patient expressed understanding and agreed to proceed.   History of Present Illness: T2DM with peripheral neuropathy Increase to Mounjaro 7.5 mg weekly at last visit on 04/28/2023, can continue to increase dose. Has not been able to check blood sugar.  Would like to increase Mounjaro dosing.  Sciatica Seen by neurology in 06/02/2023.  Received trigger point injection in L4/L5 paraspinal muscles.  Started on lamotrigine  with titration up to 50 mg twice daily for polyneuropathy. Reports epidural injections have not been working.   Saw Dr. Benedetta Bradley (NSGY) who wanted her to see Neurology prior to surgery. Seen by [facility] on 06/02/2023 who was an "MS specialist". He started her on medication for neuropathy but she feels like it made things worse. Feels like her bones are going to bust through her skin. Told to take B12 and vitamin D . Planning to return on 07/14/2023.   NSGY told her to go the the ED for work up when called about worsening pain. Feet are cold and numb. Can barely get to her bathroom. Could not get dressed to get to appointment today.   Not suicidal.  Think she is at the point where she needs something for chronic pain.  GAD, MDD with psychotic features Seen by behavioral health on 05/21/2023, Prozac  was increased to 40 mg daily.   Observations/Objective: Normal work of breathing Intermittently tearful expressed some sadness and  frustration  Assessment and Plan: Chronic bilateral low back pain with left-sided sciatica Chronic, uncontrolled. Unfortunately has gone to multiple different specialist including orthopedics, neurosurgery, PM&R and neurology without improvement in symptoms from medications and epidural injections.  Potentially planning for surgical intervention per neurosurgery but patient is uncertain and no notes available to review in epic.  Given patient's extensive eval the past year without improvement, she may now be at the point where chronic pain management is her only option.  Recommended continued follow-up with neurosurgery but will refer to pain clinic for evaluation. - Referral to pain clinic Type 2 diabetes mellitus with chronic kidney disease, with long-term current use of insulin , unspecified CKD stage (HCC) Last A1c 8.6 in 03/2023. Has not been checking BGL 2/2 pain as above. Plan for in office follow up to obtain A1c and discuss med titration. -Increase to Mounjaro 10mg  weekly.  Follow Up Instructions:F/u in the next month for A1c and DM care    I discussed the assessment and treatment plan with the patient. The patient was provided an opportunity to ask questions and all were answered. The patient agreed with the plan and demonstrated an understanding of the instructions.   The patient was advised to call back or seek an in-person evaluation if the symptoms worsen or if the condition fails to improve as anticipated.   Jonne Netters, MD

## 2023-06-25 ENCOUNTER — Other Ambulatory Visit: Payer: Self-pay

## 2023-06-25 ENCOUNTER — Telehealth: Payer: Self-pay

## 2023-06-25 ENCOUNTER — Telehealth: Admitting: Family Medicine

## 2023-06-25 ENCOUNTER — Encounter: Payer: Self-pay | Admitting: Family Medicine

## 2023-06-25 DIAGNOSIS — M5442 Lumbago with sciatica, left side: Secondary | ICD-10-CM | POA: Diagnosis not present

## 2023-06-25 DIAGNOSIS — E1122 Type 2 diabetes mellitus with diabetic chronic kidney disease: Secondary | ICD-10-CM

## 2023-06-25 DIAGNOSIS — G8929 Other chronic pain: Secondary | ICD-10-CM | POA: Diagnosis not present

## 2023-06-25 DIAGNOSIS — Z794 Long term (current) use of insulin: Secondary | ICD-10-CM

## 2023-06-25 MED ORDER — MOUNJARO 10 MG/0.5ML ~~LOC~~ SOAJ
10.0000 mg | SUBCUTANEOUS | 2 refills | Status: DC
Start: 2023-06-25 — End: 2023-07-31

## 2023-06-25 NOTE — Assessment & Plan Note (Deleted)
 Chronic, uncontrolled. Unfortunately has gone

## 2023-06-25 NOTE — Assessment & Plan Note (Deleted)
 Last A1c 8.6 in 03/2023. Has not been checking BGL 2/2 pain as above. Plan for in office follow up to obtain A1c and discuss med titration. -Increase to Mounjaro 10mg  weekly.

## 2023-06-25 NOTE — Telephone Encounter (Signed)
 Patient calls nurse line in regards to apt at 2:50p.   She reports she is in so much pain and she reports she will not be able to "make it to the car."   She is requesting this afternoons apt be changed to virtual.   Advised will send to PCP, however she may have to reschedule.

## 2023-06-30 ENCOUNTER — Telehealth: Payer: Self-pay | Admitting: *Deleted

## 2023-06-30 NOTE — Progress Notes (Signed)
 Complex Care Management Care Guide Note  06/30/2023 Name: [name] MRN: [id number] DOB: [birth year]  [name] is a 53 y.o. year old female who is a primary care patient of Jonne Netters, MD and is actively engaged with the care management team. I reached out to [name] by phone today to assist with re-scheduling  with the RN Case Manager.  Follow up plan: Unsuccessful telephone outreach attempt made. A HIPAA compliant phone message was left for the patient providing contact information and requesting a return call. No further outreach attempts will be made at this time. We have been unable to contact the patient to reschedule for complex care management services.   Barnie Bora  [facility]  [facility], [facility] Guide  Direct Dial: [phone]  Fax [phone]

## 2023-07-01 ENCOUNTER — Telehealth: Payer: Self-pay

## 2023-07-01 ENCOUNTER — Telehealth: Payer: Self-pay | Admitting: Neurology

## 2023-07-01 DIAGNOSIS — G8929 Other chronic pain: Secondary | ICD-10-CM

## 2023-07-01 DIAGNOSIS — E1140 Type 2 diabetes mellitus with diabetic neuropathy, unspecified: Secondary | ICD-10-CM

## 2023-07-01 DIAGNOSIS — M79604 Pain in right leg: Secondary | ICD-10-CM

## 2023-07-01 DIAGNOSIS — G629 Polyneuropathy, unspecified: Secondary | ICD-10-CM

## 2023-07-01 MED ORDER — GABAPENTIN 300 MG PO CAPS: 600.0000 mg | ORAL_CAPSULE | Freq: Four times a day (QID) | ORAL | 3 refills | Status: DC

## 2023-07-01 NOTE — Telephone Encounter (Signed)
 Called and spoke pt. Relayed Dr. Thom Fleeting recommendation. She was agreeable to plan. She will call back if ineffective.  New rx sent into pharmacy for pt.

## 2023-07-01 NOTE — Telephone Encounter (Signed)
 Pt said, having pain in legs and feet cannot walk. Spoke PCP, was told to see my neurologist. They also said I was suppose to see Dr. Gracie Lav for the referral.  Transferred call to POD 1

## 2023-07-01 NOTE — Addendum Note (Signed)
 Addended by: Oneita Bihari on: 07/01/2023 01:39 PM   Modules accepted: Orders

## 2023-07-01 NOTE — Telephone Encounter (Signed)
 Patient calls nurse line in regards to Pain Management referral.   Referral was placed on 4/17.  Will forward to referral coordinator.

## 2023-07-01 NOTE — Telephone Encounter (Signed)
 Called pt to discuss. She reports severe pain in back/legs. Been seeing [facility] for injections in back/hip but ineffective. Last inj 06/2023. Unable to afford 94.00 copay. Does not want to continue with this.   Spoke w/ Dr. Michale Age office who states they referred her specifically to Dr. Gracie Lav, not Dr. Godwin Lat and they want her to see Dr. Gracie Lav. I did reassure pt that both neurologist and can evaluate/treat. Most likely got scheduled with Dr. Godwin Lat if Dr. Gracie Lav did not have anything available at the time but no notes on referral mentioning this from what I can see. I reassured pt that Dr. Godwin Lat qualified neurologist to evaluate/treat.   Confirmed she takes gabapentin  300mg , Take 1 capsule in the morning, 2 capsules in the late afternoon and 2 capsules at bedtime. She has d/c'd lamotrigine .  This is ineffective. Unable to walk d/t severe pain.   She is supposed to go back to work 08/2023, been off work 07/03/22. Does not feel she can go back to work at this point.   She called PCP who is referring her to another pain specialist but this can take 2-4 wk to get appt.   She is very tearful on the phone and wanting to know what Dr. Godwin Lat recommends at this point. Aware I will send to MD and call back.

## 2023-07-01 NOTE — Telephone Encounter (Signed)
 Patient calls nurse line reporting confusion over Neurologist.   Patient was hysterical on the phone and crying. It was very difficult to understand the concern in full detail.   Patient reports she was referred to Dr. Godwin Lat by Dr. Michale Age. She reports she was told by Dr. Godwin Lat at initial visit that he was a "MS" specialist and was unsure why she was referred to him specifically. He stated he didn't need to see her again going forward, per patient.   Patient advised to call Dr. Ruthy Cox office in regards to referral. Patient became hysterical shouting " I DID and they wouldn't help me."   Updated patient on referral process for pain management (see other telephone encounter."   Patient abruptly hung up the phone.

## 2023-07-03 ENCOUNTER — Ambulatory Visit (INDEPENDENT_AMBULATORY_CARE_PROVIDER_SITE_OTHER): Admitting: Family Medicine

## 2023-07-03 ENCOUNTER — Encounter: Payer: Self-pay | Admitting: Family Medicine

## 2023-07-03 VITALS — BP 127/84 | HR 99 | Ht 71.0 in | Wt 314.0 lb

## 2023-07-03 DIAGNOSIS — G629 Polyneuropathy, unspecified: Secondary | ICD-10-CM | POA: Diagnosis not present

## 2023-07-03 DIAGNOSIS — M5442 Lumbago with sciatica, left side: Secondary | ICD-10-CM

## 2023-07-03 DIAGNOSIS — G8929 Other chronic pain: Secondary | ICD-10-CM | POA: Diagnosis not present

## 2023-07-03 DIAGNOSIS — K5903 Drug induced constipation: Secondary | ICD-10-CM

## 2023-07-03 DIAGNOSIS — E1122 Type 2 diabetes mellitus with diabetic chronic kidney disease: Secondary | ICD-10-CM | POA: Diagnosis not present

## 2023-07-03 DIAGNOSIS — Z794 Long term (current) use of insulin: Secondary | ICD-10-CM

## 2023-07-03 DIAGNOSIS — E039 Hypothyroidism, unspecified: Secondary | ICD-10-CM | POA: Diagnosis not present

## 2023-07-03 LAB — POCT GLYCOSYLATED HEMOGLOBIN (HGB A1C): HbA1c, POC (controlled diabetic range): 7.6 % — AB (ref 0.0–7.0)

## 2023-07-03 MED ORDER — LACTULOSE 10 GM/15ML PO SOLN: 20.0000 g | Freq: Three times a day (TID) | ORAL | 0 refills | Status: AC

## 2023-07-03 MED ORDER — GABAPENTIN 300 MG PO CAPS: ORAL_CAPSULE | ORAL | 3 refills | Status: DC

## 2023-07-03 NOTE — Patient Instructions (Addendum)
 It was wonderful to see you today! Thank you for choosing [facility].   Please bring ALL of your medications with you to every visit.   Today we talked about:  Your A1c is 7.6! Keep taking your insulin  and checking your fasting blood sugar if possible.  Your gabapentin  dose was too high for your kidney function.  Please go back to taking 300 mg tablet 1 in the morning, 2 at lunch and 2 at night.  This is the maximum dose I would recommend.  This could be what is causing your constipation. For your constipation would like you to use the lactulose 3 times a day for the next 1 to 2 days until you have a good bowel movement.  I anticipate reducing your gabapentin  dose will hopefully help prevent any future constipation. Please call [facility] to schedule on appointment with the pain clinic.  [facility] [address] [place], [place] [address] Phone: [phone] Fax: [phone]  Please follow up in 1-2 months   Call the clinic at [phone] if your symptoms worsen or you have any concerns.  Please be sure to schedule follow up at the front desk before you leave today.   Jonne Netters, DO Family Medicine

## 2023-07-03 NOTE — Progress Notes (Signed)
    SUBJECTIVE:   CHIEF COMPLAINT / HPI:   Diabetes Current Regimen: Mounjaro 10 mg weekly, Lantus  65 units twice daily, NovoLog  15 units a.m. 20 units p.m. CBGs: Not checking, has been dealing with a lot. Last A1c:  Lab Results  Component Value Date   HGBA1C 7.6 (A) 07/03/2023    Denies polyuria, polydipsia, hypoglycemia Last Eye Exam: Due, reiterated importance Statin: Rosuvastatin  20 mg daily ACE/ARB: Losartan  25 mg daily  Chronic low back pain with left-sided sciatica Has been seeing Dr. Benedetta Bradley with NSGY.  Unfortunately no notes available for review.  Per patient had discussed possible surgical intervention if she lost 15 pounds.  She has since returned to see him and states that he felt most of her pain was due to peripheral neuropathy and did not warrant further intervention.  She is requesting second opinion.  Peripheral neuropathy Seen by neurology, Dr. Zadie Herter but patient reports she was originally supposed to be seeing Dr. Carloyn Chi but the office did not schedule her with them.  Reach out to his office regarding worsening pain, reports her gabapentin  was increased to 600 mg 4 times a day.  Since that time she has had significantly worsening constipation reports she has not pooped in the last 7 days.  Has already trialed OTC laxatives without success.  PERTINENT  PMH / PSH: Sciatica with L sided radiculopathy, T2DM, bipolar depression, CHF, CKD3b   OBJECTIVE:   BP 127/84   Pulse 99   Ht 5\' 11"  (1.803 m)   Wt (!) 314 lb (142.4 kg)   LMP 10/09/2022   SpO2 100%   BMI 43.79 kg/m    General: Lying on her right side, intermittently tearful/frustrated. Respiratory: Normal respiratory effort on room air MSK: Significant difficulty getting up and off the exam table secondary to pain.  Antalgic gait. Skin: Warm and dry, no rashes noted Neuro: Alert, no obvious focal deficits Psych: Consistently denies SI.  ASSESSMENT/PLAN:   Assessment & Plan Type 2 diabetes mellitus with  chronic kidney disease, with long-term current use of insulin , unspecified CKD stage (HCC) A1c 7.6 improved from 8.6 on 03/2023.  Not checking her sugars but denies any symptoms of hyper/hypoglycemia.  Not ready for CGM at this time.  Continue on current regimen and titrate up Mounjaro to desired effect. Acquired hypothyroidism Due for annual TSH, unable to obtain since lab unavailable in clinic. -Future TSH Chronic bilateral low back pain with left-sided sciatica Unfortunately now seems NSGY is not considering surgical intervention per patient report (no records available), patient unclear why it would like second opinion.  Pain continues to be significantly uncontrolled, pending appointment with [facility] medical for chronic pain management. -Refer to NSGY for second opinion, preferably in [place]. Peripheral polyneuropathy Likely contributing to overall pain.  Seen by neurology, gabapentin  increased to 600 mg 4 times daily but given CKD 4 above maximum recommended dose. -Decrease to gabapentin  300mg  AM, 600 mg afternoon and 600 mg evening dosing Drug-induced constipation Likely secondary to increased gabapentin  dosing as above.  Has already trialed OTC laxatives without effect -Lactulose 20 mg 3 times daily until symptomatic improvement     Dr. Jonne Netters, DO [facility]

## 2023-07-06 NOTE — Assessment & Plan Note (Signed)
 A1c 7.6 improved from 8.6 on 03/2023.  Not checking her sugars but denies any symptoms of hyper/hypoglycemia.  Not ready for CGM at this time.  Continue on current regimen and titrate up Mounjaro to desired effect.

## 2023-07-06 NOTE — Assessment & Plan Note (Signed)
 Unfortunately now seems NSGY is not considering surgical intervention per patient report (no records available), patient unclear why it would like second opinion.  Pain continues to be significantly uncontrolled, pending appointment with [facility] medical for chronic pain management. -Refer to NSGY for second opinion, preferably in [place].

## 2023-07-06 NOTE — Assessment & Plan Note (Signed)
 Due for annual TSH, unable to obtain since lab unavailable in clinic. -Future TSH

## 2023-07-07 ENCOUNTER — Emergency Department (HOSPITAL_COMMUNITY): Payer: Self-pay

## 2023-07-07 ENCOUNTER — Emergency Department (HOSPITAL_COMMUNITY)
Admission: EM | Admit: 2023-07-07 | Discharge: 2023-07-07 | Disposition: A | Discharge status: Home / Self Care | Payer: Self-pay | Attending: Emergency Medicine | Admitting: Emergency Medicine

## 2023-07-07 ENCOUNTER — Encounter (HOSPITAL_COMMUNITY): Payer: Self-pay

## 2023-07-07 ENCOUNTER — Other Ambulatory Visit: Payer: Self-pay

## 2023-07-07 DIAGNOSIS — Y92009 Unspecified place in unspecified non-institutional (private) residence as the place of occurrence of the external cause: Secondary | ICD-10-CM | POA: Insufficient documentation

## 2023-07-07 DIAGNOSIS — M5442 Lumbago with sciatica, left side: Secondary | ICD-10-CM | POA: Insufficient documentation

## 2023-07-07 DIAGNOSIS — Z79899 Other long term (current) drug therapy: Secondary | ICD-10-CM | POA: Insufficient documentation

## 2023-07-07 DIAGNOSIS — R531 Weakness: Secondary | ICD-10-CM | POA: Diagnosis not present

## 2023-07-07 DIAGNOSIS — N183 Chronic kidney disease, stage 3 unspecified: Secondary | ICD-10-CM | POA: Diagnosis not present

## 2023-07-07 DIAGNOSIS — I129 Hypertensive chronic kidney disease with stage 1 through stage 4 chronic kidney disease, or unspecified chronic kidney disease: Secondary | ICD-10-CM | POA: Insufficient documentation

## 2023-07-07 DIAGNOSIS — G8929 Other chronic pain: Secondary | ICD-10-CM | POA: Diagnosis not present

## 2023-07-07 DIAGNOSIS — E1142 Type 2 diabetes mellitus with diabetic polyneuropathy: Secondary | ICD-10-CM | POA: Diagnosis not present

## 2023-07-07 DIAGNOSIS — Z91040 Latex allergy status: Secondary | ICD-10-CM | POA: Diagnosis not present

## 2023-07-07 DIAGNOSIS — M549 Dorsalgia, unspecified: Secondary | ICD-10-CM | POA: Diagnosis present

## 2023-07-07 DIAGNOSIS — E039 Hypothyroidism, unspecified: Secondary | ICD-10-CM | POA: Diagnosis not present

## 2023-07-07 DIAGNOSIS — W19XXXA Unspecified fall, initial encounter: Secondary | ICD-10-CM | POA: Insufficient documentation

## 2023-07-07 DIAGNOSIS — Z794 Long term (current) use of insulin: Secondary | ICD-10-CM | POA: Insufficient documentation

## 2023-07-07 MED ORDER — LIDOCAINE 5 % EX PTCH: 1.0000 | MEDICATED_PATCH | CUTANEOUS | 0 refills | Status: DC

## 2023-07-07 MED ORDER — HYDROMORPHONE HCL 1 MG/ML IJ SOLN
0.5000 mg | Freq: Once | INTRAMUSCULAR | Status: AC
  Administered 2023-07-07: 0.5 mg via INTRAVENOUS
  Filled 2023-07-07: qty 1

## 2023-07-07 MED ORDER — OXYCODONE HCL 5 MG PO TABS
10.0000 mg | ORAL_TABLET | Freq: Three times a day (TID) | ORAL | 0 refills | Status: AC | PRN
Start: 2023-07-07 — End: 2023-07-10

## 2023-07-07 MED ORDER — LIDOCAINE 5 % EX PTCH
1.0000 | MEDICATED_PATCH | CUTANEOUS | Status: DC
  Administered 2023-07-07: 1 via TRANSDERMAL
  Filled 2023-07-07: qty 1

## 2023-07-07 NOTE — Discharge Instructions (Signed)
 Today you received IV pain medication for your worsening back and leg pain. You were also prescribed outpatient pain medication for your  back and leg as well, do not drive or operate heavy machinery when taking this medication. You were also prescribed lidocaine  patches for your back and leg pain which can be placed on dry skin on your lower back being sure to avoid any places that may have an open wound.   Follow-up with your primary care provider, neurology, and pain management for a hospital follow-up as well as ongoing diagnosis and treatment.
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
## 2023-07-07 NOTE — ED Triage Notes (Signed)
 BIB EMS from home for back pain and sciatica with leg weakness for about a year that is worse today. Pt did have a controlled fall to the ground prior to EMS arriving. No injury, did not hit head, no blood thinners

## 2023-07-08 ENCOUNTER — Encounter: Payer: Self-pay | Admitting: Family Medicine

## 2023-07-08 DIAGNOSIS — G629 Polyneuropathy, unspecified: Secondary | ICD-10-CM

## 2023-07-09 ENCOUNTER — Telehealth: Payer: Self-pay | Admitting: Family Medicine

## 2023-07-09 NOTE — Telephone Encounter (Signed)
 Patient's son dropped off disability paperwork to be completed. Last DOS was 07/03/23. Placed in [employer].

## 2023-07-13 NOTE — Telephone Encounter (Signed)
 Placed in MDs box to be filled out. Dalon Reichart Bruna Potter, CMA

## 2023-07-14 ENCOUNTER — Ambulatory Visit: Admitting: Neurology

## 2023-07-14 ENCOUNTER — Encounter: Payer: Self-pay | Admitting: Neurology

## 2023-07-20 ENCOUNTER — Other Ambulatory Visit

## 2023-07-20 ENCOUNTER — Other Ambulatory Visit: Payer: Self-pay | Admitting: Family Medicine

## 2023-07-20 DIAGNOSIS — F411 Generalized anxiety disorder: Secondary | ICD-10-CM

## 2023-07-20 DIAGNOSIS — G8929 Other chronic pain: Secondary | ICD-10-CM

## 2023-07-20 DIAGNOSIS — E118 Type 2 diabetes mellitus with unspecified complications: Secondary | ICD-10-CM

## 2023-07-20 NOTE — Telephone Encounter (Signed)
 Form placed up front for pick up.   Copy made for batch scanning.   Patient aware.

## 2023-07-21 ENCOUNTER — Telehealth: Payer: Self-pay

## 2023-07-21 NOTE — Telephone Encounter (Signed)
 Patient calls nurse line regarding referral for pain management clinic.   She reports that she has been waiting for a month and has still not been scheduled with pain clinic.   She states that she has called over to [facility], however, is told that she has to wait for referral coordinator to look through her file.   She is asking if there is anywhere else in [place] that we could send her. Advised of limitations with insurance coverage and the lengthy process for becoming established with pain management clinic.   Told patient that I would send message to [facility] for further advisement on pain clinic.   Elsie Halo, RN

## 2023-07-21 NOTE — Telephone Encounter (Signed)
 PDMP reviewed and appropriate.  Last refilled lorazepam  #40 tablets on 06/18/2023.

## 2023-07-31 ENCOUNTER — Telehealth (INDEPENDENT_AMBULATORY_CARE_PROVIDER_SITE_OTHER): Admitting: Family Medicine

## 2023-07-31 DIAGNOSIS — Z794 Long term (current) use of insulin: Secondary | ICD-10-CM

## 2023-07-31 DIAGNOSIS — N189 Chronic kidney disease, unspecified: Secondary | ICD-10-CM | POA: Diagnosis not present

## 2023-07-31 DIAGNOSIS — G8929 Other chronic pain: Secondary | ICD-10-CM

## 2023-07-31 DIAGNOSIS — M5442 Lumbago with sciatica, left side: Secondary | ICD-10-CM | POA: Diagnosis not present

## 2023-07-31 DIAGNOSIS — E1122 Type 2 diabetes mellitus with diabetic chronic kidney disease: Secondary | ICD-10-CM | POA: Diagnosis not present

## 2023-07-31 MED ORDER — MOUNJARO 12.5 MG/0.5ML ~~LOC~~ SOAJ: 12.5000 mg | SUBCUTANEOUS | 2 refills | Status: DC

## 2023-07-31 NOTE — Progress Notes (Signed)
 Virtual Visit via Video Note  I connected with [name] on 08/01/23 at 11:10 AM EDT by a video enabled telemedicine application and verified that I am speaking with the correct person using two identifiers.  Location: Patient: [name] - home Provider: Jonne Netters, DO - [facility]   I discussed the limitations of evaluation and management by telemedicine and the availability of in person appointments. The patient expressed understanding and agreed to proceed.  History of Present Illness:  Lumbar radiculopathy Continues to have severe ongoing pain.  Has not been able to see pain management at [facility] medical as they have not been able to schedule her appointment the past 2 weeks.  States she did see [name], with neurology and he was more helpful in managing her symptoms. Reports that he referred her to their in-house neurosurgery and she has an appointment on 6/9 for further evaluation.    Does not think she will be able to return to work on 08/28/2023, would like to extend her leave for at least a few months while she is continuing evaluation.  Diabetes Current Regimen: Mounjaro 10 mg weekly, Lantus  65 units twice daily, NovoLog  15 units a.m. 20 units p.m.  CBGs: Not checking  Last A1c:  Lab Results  Component Value Date   HGBA1C 7.6 (A) 07/03/2023    Denies polyuria, polydipsia, hypoglycemia Last Eye Exam: DUE Statin: Rosuvastatin  20 mg daily ACE/ARB: Losartan  25 mg daily  PERTINENT  PMH / PSH: Sciatica with L sided radiculopathy, T2DM, bipolar depression, CHF, CKD4   Observations/Objective: Alert, NAD Normal WOB on RA  Assessment and Plan: Assessment & Plan Chronic bilateral low back pain with left-sided sciatica Chronic, unfortunately continues to have uncontrolled symptoms.  [facility] medical after visit, referral was misplaced therefore repeat fax was sent and they will call patient ASAP to schedule.  Recommend continue to follow with neurology and  neurosurgery as planned.  Continue current pain regimen. -Will complete work paperwork when available to extend leave date to 10/1 Type 2 diabetes mellitus with chronic kidney disease, with long-term current use of insulin , unspecified CKD stage (HCC) A1c 7.6 in 06/2023, is not checking CBGs at home.  Denies symptoms of hypoglycemia.  Tolerating Mounjaro well, will increase dosing to achieve weight loss goals for possible surgical intervention. -Increase Mounjaro 12.5 mg weekly -Repeat A1c in 09/2023    Follow Up Instructions: Follow-up in 2 months for A1c, needs TSH at that time    I discussed the assessment and treatment plan with the patient. The patient was provided an opportunity to ask questions and all were answered. The patient agreed with the plan and demonstrated an understanding of the instructions.   The patient was advised to call back or seek an in-person evaluation if the symptoms worsen or if the condition fails to improve as anticipated.  Jonne Netters, MD

## 2023-08-01 NOTE — Assessment & Plan Note (Signed)
 Chronic, unfortunately continues to have uncontrolled symptoms.  [facility] medical after visit, referral was misplaced therefore repeat fax was sent and they will call patient ASAP to schedule.  Recommend continue to follow with neurology and neurosurgery as planned.  Continue current pain regimen. -Will complete work paperwork when available to extend leave date to 10/1

## 2023-08-01 NOTE — Assessment & Plan Note (Signed)
 A1c 7.6 in 06/2023, is not checking CBGs at home.  Denies symptoms of hypoglycemia.  Tolerating Mounjaro well, will increase dosing to achieve weight loss goals for possible surgical intervention. -Increase Mounjaro 12.5 mg weekly -Repeat A1c in 09/2023

## 2023-08-02 LAB — LAB REPORT - SCANNED: EGFR (African American): 30

## 2023-08-04 ENCOUNTER — Telehealth: Payer: Self-pay | Admitting: Family Medicine

## 2023-08-05 ENCOUNTER — Encounter (HOSPITAL_COMMUNITY): Payer: Self-pay | Admitting: Psychiatry

## 2023-08-05 ENCOUNTER — Telehealth (INDEPENDENT_AMBULATORY_CARE_PROVIDER_SITE_OTHER): Admitting: Psychiatry

## 2023-08-05 DIAGNOSIS — F333 Major depressive disorder, recurrent, severe with psychotic symptoms: Secondary | ICD-10-CM

## 2023-08-05 DIAGNOSIS — F411 Generalized anxiety disorder: Secondary | ICD-10-CM

## 2023-08-05 MED ORDER — FLUOXETINE HCL 40 MG PO CAPS: 40.0000 mg | ORAL_CAPSULE | Freq: Every day | ORAL | 3 refills | Status: DC

## 2023-08-05 MED ORDER — MIRTAZAPINE 15 MG PO TABS: 15.0000 mg | ORAL_TABLET | Freq: Every day | ORAL | 3 refills | Status: DC

## 2023-08-05 MED ORDER — QUETIAPINE FUMARATE 100 MG PO TABS
100.0000 mg | ORAL_TABLET | Freq: Every day | ORAL | 3 refills | Status: DC
Start: 2023-08-05 — End: 2023-12-17

## 2023-08-05 MED ORDER — HYDROXYZINE HCL 50 MG PO TABS
50.0000 mg | ORAL_TABLET | Freq: Three times a day (TID) | ORAL | 3 refills | Status: DC | PRN
Start: 2023-08-05 — End: 2023-12-17

## 2023-08-05 NOTE — Progress Notes (Signed)
 [facility] MD/PA/NP OP Progress Note Virtual Visit via Video Note  I connected with [name] on 08/05/23 at 11:30 AM EDT by a video enabled telemedicine application and verified that I am speaking with the correct person using two identifiers.  Location: Patient:Home Provider: Clinic   I discussed the limitations of evaluation and management by telemedicine and the availability of in person appointments. The patient expressed understanding and agreed to proceed.  I provided 30 minutes of non-face-to-face time during this encounter.       08/05/2023 11:45 AM [name]  MRN:  [id number]  Chief Complaint: "I was seen at [facility] pain clinic"  HPI: 53 year old female seen today for follow up psychiatric evaluation. She has a psychiatric history of  OCD, adjustment disorder, anxiety, depression, SI, and bipolar disorder. She is currently being managed on  Prozac  40 mg daily, Seroquel  100 mg nightly, Gabapentin  300 mg three times daily as needed (prescribed by PCP), Vistaril  50 mg three times daily, and mirtazapine  15 mg at bedtime.  She is also prescribed Ativan  1 mg BID as needed (by her PCP). She informed Clinical research associate that her medications are effective in managing her psychiatric conditions.   Today she was well-groomed, pleasant, cooperative, and engaged in conversation. She informed Clinical research associate that recently she was seen at [facility]. She notes that she was told that ativan  had to be discontinued prior to starting opioids.  She notes that this is fearful for her as her mental health is finally under control.  Provider informed patient that Ativan  should not be taken for a long period of time and informed her that other medication could be trialed.  She endorsed understanding.   Since increasing Prozac  and her last visit she notes that her anxiety and depression has been well-managed.  She does note that depending on her pain her depression worsens and she becomes tearful.  She  quantifies her pain today as 8 out of 10. Today provider conducted a GAD-7 and patient scored a 6, at her last visit she scored an 13.  Provider also conducted PHQ-9 and patient scored a 0, at her last visit she scored a 3.  Today she denies SI/HI/VAH, mania, paranoia.  No medication changes made today.  Patient agreeable to continue medication as prescribed.  No other concerns noted at this time.     Visit Diagnosis:    ICD-10-CM   1. Generalized anxiety disorder  F41.1 hydrOXYzine  (ATARAX ) 50 MG tablet    QUEtiapine  (SEROQUEL ) 100 MG tablet    2. Severe recurrent major depressive disorder with psychotic features (HCC)  F33.3 QUEtiapine  (SEROQUEL ) 100 MG tablet             Past Psychiatric History: OCD, adjustment disorder, anxiety, depression, SI, and bipolar disorder.   Past Medical History:  Past Medical History:  Diagnosis Date   Acute renal failure (HCC) 05/27/2010   hemodialysis for 6 weeks   Anxiety    Asthma    Depression    Diabetes mellitus    Hypertension    Rhabdomyolysis 06/06/2010   after drug overdose   Suicide attempt by drug ingestion (HCC) 06/05/2010   result rhabdomyolosis and ARF requrining dialysis    SVT (supraventricular tachycardia) (HCC) 05/28/2021    Past Surgical History:  Procedure Laterality Date   ANKLE ARTHROSCOPY Right 08/17/2015   Procedure: RIGHT ANKLE ARTHROSCOPY WITH SYNOVECTOMY AND LOOSE BODY EXCISION;  Surgeon: Dayne Even, MD;  Location: [facility] OR;  Service: Orthopedics;  Laterality: Right;  CHOLECYSTECTOMY     TUBAL LIGATION  1998    Family Psychiatric History: Unknown  Family History:  Family History  Problem Relation Age of Onset   Asthma Father     Social History:  Social History   Socioeconomic History   Marital status: Married    Spouse name: Not on file   Number of children: 2   Years of education: Not on file   Highest education level: Not on file  Occupational History   Occupation: Research officer, trade union   Tobacco Use   Smoking status: Never    Passive exposure: Never   Smokeless tobacco: Never  Vaping Use   Vaping status: Never Used  Substance and Sexual Activity   Alcohol use: No   Drug use: No   Sexual activity: Yes    Comment: with husband   Other Topics Concern   Not on file  Social History Narrative   Married high school boyfriend at age 15, two boys, still married but he has been living with other women for years.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   Pt not working    Pt lives with family    Social Drivers of Corporate investment banker Strain: High Risk (06/19/2023)   Overall Financial Resource Strain (CARDIA)    Difficulty of Paying Living Expenses: Very hard  Food Insecurity: Food Insecurity Present (06/17/2023)   Hunger Vital Sign    Worried About Running Out of Food in the Last Year: Sometimes true    Ran Out of Food in the Last Year: Sometimes true  Transportation Needs: Unmet Transportation Needs (06/17/2023)   PRAPARE - Administrator, Civil Service (Medical): Yes    Lack of Transportation (Non-Medical): Yes  Physical Activity: Not on file  Stress: Not on file  Social Connections: Not on file    Allergies:  Allergies  Allergen Reactions   Ace Inhibitors Other (See Comments)    Patient had acute renal failure requiring hemodialysis after a suicide attempt.  Nephro recommended avoiding use.   Haldol  [Haloperidol  Lactate] Other (See Comments)    Tardive dyskinesia   Nsaids Other (See Comments)    Nephro recommended avoiding after acute renal failure requiring hemodialysis associated with a suicide attempt.   Entresto  [Sacubitril-Valsartan] Itching   Latex Other (See Comments)    Rash around IV site    Metabolic Disorder Labs: Lab Results  Component Value Date   HGBA1C 7.6 (A) 07/03/2023   MPG 174.29 07/09/2022   MPG 243.17 11/14/2017   No results found for: "PROLACTIN" Lab Results   Component Value Date   CHOL 201 (H) 04/15/2021   TRIG 231 (H) 04/15/2021   HDL 49 04/15/2021   CHOLHDL 4.1 04/15/2021   VLDL 46 (H) 04/10/2015   LDLCALC 112 (H) 04/15/2021   LDLCALC 125 (H) 12/19/2020   Lab Results  Component Value Date   TSH 1.015 02/20/2022   TSH 2.689 05/19/2021    Therapeutic Level Labs: No results found for: "LITHIUM" No results found for: "VALPROATE" No results found for: "CBMZ"  Current Medications: Current Outpatient Medications  Medication Sig Dispense Refill   albuterol  (VENTOLIN  HFA) 108 (90 Base) MCG/ACT inhaler Inhale 2 puffs into the lungs every 6 (six) hours as needed for shortness of breath. 18 g 5   blood glucose meter kit and supplies KIT Dispense based on patient and insurance preference. Use up to four times daily as directed. 1 each  0   FLUoxetine  (PROZAC ) 40 MG capsule Take 1 capsule (40 mg total) by mouth daily. 30 capsule 3   fluticasone  (FLONASE ) 50 MCG/ACT nasal spray Place 1 spray into both nostrils daily. 1 spray in each nostril every day 16 g 1   fluticasone -salmeterol (WIXELA INHUB) 250-50 MCG/ACT AEPB Inhale 1 puff into the lungs in the morning and at bedtime. (Patient taking differently: Inhale 1 puff into the lungs 2 (two) times daily as needed.) 60 each 1   furosemide  (LASIX ) 40 MG tablet Take 1 tablet by mouth once daily 30 tablet 2   gabapentin  (NEURONTIN ) 300 MG capsule Take 1 tablet in the AM, two at lunch and two before bed 150 capsule 3   hydrOXYzine  (ATARAX ) 50 MG tablet Take 1 tablet (50 mg total) by mouth 3 (three) times daily as needed for anxiety. 90 tablet 3   Insulin  Pen Needle 32G X 4 MM MISC 1 each by Does not apply route 3 (three) times daily. 300 each 2   isosorbide -hydrALAZINE  (BIDIL ) 20-37.5 MG tablet Take 1 tablet by mouth 3 (three) times daily. 90 tablet 0   LANTUS  SOLOSTAR 100 UNIT/ML Solostar Pen INJECT 65 UNITS SUBCUTANEOUSLY TWICE DAILY 15 mL 5   levothyroxine  (SYNTHROID ) 300 MCG tablet Take 1 tablet (300  mcg total) by mouth daily at 6 (six) AM. 90 tablet 2   lidocaine  (LIDODERM ) 5 % Place 1 patch onto the skin daily. Remove & Discard patch within 12 hours or as directed by MD 30 patch 0   LORazepam  (ATIVAN ) 1 MG tablet Take 1 tablet by mouth twice daily as needed for anxiety 45 tablet 0   losartan  (COZAAR ) 25 MG tablet Take 1 tablet (25 mg total) by mouth at bedtime. 30 tablet 1   methocarbamol  (ROBAXIN ) 500 MG tablet TAKE 2 TABLETS BY MOUTH THREE TIMES DAILY 180 tablet 2   metoprolol  succinate (TOPROL -XL) 50 MG 24 hr tablet Take 1 tablet (50 mg total) by mouth at bedtime. Take with or immediately following a meal. (Patient taking differently: Take 50 mg by mouth daily. Take with or immediately following a meal.) 90 tablet 3   mirtazapine  (REMERON ) 15 MG tablet Take 1 tablet (15 mg total) by mouth at bedtime. Decrease by half in 2 weeks and then discontinue.  Weight gaining medication. 30 tablet 3   NOVOLOG  FLEXPEN 100 UNIT/ML FlexPen INJECT 15 UNITS SUBCUTANEOUSLY WITH LUNCH AND INJECT 20 UNITS WITH SUPPER 15 mL 3   pantoprazole  (PROTONIX ) 40 MG tablet Take 1 tablet (40 mg total) by mouth daily. 30 tablet 3   QUEtiapine  (SEROQUEL ) 100 MG tablet Take 1 tablet (100 mg total) by mouth at bedtime. 30 tablet 3   rosuvastatin  (CRESTOR ) 20 MG tablet Take 1 tablet (20 mg total) by mouth daily. 30 tablet 0   tirzepatide (MOUNJARO) 12.5 MG/0.5ML Pen Inject 12.5 mg into the skin once a week. 2 mL 2   Current Facility-Administered Medications  Medication Dose Route Frequency Provider Last Rate Last Admin   bupivacaine  (MARCAINE ) 0.5 % (with pres) injection 3 mL  3 mL Infiltration Once Sater, Sherida Dimmer, MD       methylPREDNISolone  acetate (DEPO-MEDROL ) injection 80 mg  80 mg Intramuscular Once Sater, Sherida Dimmer, MD         Musculoskeletal: Strength & Muscle Tone: within normal limits, telehealth visit Gait & Station: normal, telehealth visit Patient leans: N/A  Psychiatric Specialty Exam: Review of  Systems  There were no vitals taken for this visit.There is no  height or weight on file to calculate BMI.  General Appearance: Well Groomed  Eye Contact:  Good  Speech:  Clear and Coherent and Normal Rate  Volume:  Normal  Mood:  Euthymic,   Affect:  Appropriate and Congruent  Thought Process:  Coherent, Goal Directed and Linear  Orientation:  Full (Time, Place, and Person)  Thought Content: WDL and Logical   Suicidal Thoughts:  No  Homicidal Thoughts:  No  Memory:  Immediate;   Good Recent;   Good Remote;   Good  Judgement:  Good  Insight:  Good  Psychomotor Activity:  Normal  Concentration:  Concentration: Good and Attention Span: Good  Recall:  Good  Fund of Knowledge: Good  Language: Good  Akathisia:  No  Handed:  Right  AIMS (if indicated): Not done  Assets:  Communication Skills Desire for Improvement Financial Resources/Insurance Housing Social Support  ADL's:  Intact  Cognition: WNL  Sleep:  Good   Screenings: AIMS    Flowsheet Row Admission (Discharged) from 02/24/2022 in [facility]IENT [facility]  AIMS Total Score 0      AUDIT    Flowsheet Row Admission (Discharged) from 02/24/2022 in [facility] INPATIENT BE[facility]AVIORAL MEDICINE  Alcohol Use Disorder Identification Test Final Score (AUDIT) 0      GAD-7    Flowsheet Row Video Visit from 08/05/2023 in [facility] Video Visit from 05/21/2023 in [facility] Video Visit from 04/23/2023 in [facility] Video Visit from 02/20/2023 in [facility] Video Visit from 12/24/2022 in [facility]  Total GAD-7 Score 6 13 21 8 17       PHQ2-9    Flowsheet Row Video Visit from 08/05/2023 in [facility] Video Visit from 05/21/2023 in [facility] Video Visit from 04/23/2023 in [facility]  Visit from 03/12/2023 in [facility] Video Visit from 02/20/2023 in [facility]  PHQ-2 Total Score 2 2 6 2 4   PHQ-9 Total Score 2 3 23 8 7       Flowsheet Row ED from 07/07/2023 in [facility] Video Visit from 05/21/2023 in [facility] ED to Hosp-Admission (Discharged) from 04/24/2023 in [facility]  C-SSRS RISK CATEGORY No Risk High Risk High Risk        Assessment and Plan: Patient reports that since increasing Prozac  her sleep, anxiety and depression has improved.  She continues to have chronic and was told that Ativan  needed to be discontinued prior to starting opioids to help manage her pain.  At this time no medication changes are made today.  Patient agreeable to continue medication as prescribed.   1. Generalized anxiety disorder  Continue- hydrOXYzine  (ATARAX ) 50 MG tablet; Take 1 tablet (50 mg total) by mouth 3 (three) times daily as needed for anxiety.  Dispense: 90 tablet; Refill: 3 Continue- QUEtiapine  (SEROQUEL ) 100 MG tablet; Take 1 tablet (100 mg total) by mouth at bedtime.  Dispense: 30 tablet; Refill: 3  2. Severe recurrent major depressive disorder with psychotic features (HCC)  Continue- QUEtiapine  (SEROQUEL ) 100 MG tablet; Take 1 tablet (100 mg total) by mouth at bedtime.  Dispense: 30 tablet; Refill: 3   Follow-up in 2.5 months  Arlyne Bering, NP 08/05/2023, 11:45 AM

## 2023-08-05 NOTE — Telephone Encounter (Signed)
 Placed in MDs box to be filled out. Dalon Reichart Bruna Potter, CMA

## 2023-08-06 NOTE — Telephone Encounter (Signed)
 Placed in medical records box for OV notes from 03/12/23 to present.   Please let patient know once this has been completed and is available for pick up.   Elsie Halo, RN

## 2023-08-06 NOTE — Telephone Encounter (Signed)
 Medical records have been attached. I called patient to let her know they are up front ready for pick up.   Thanks!

## 2023-08-10 ENCOUNTER — Ambulatory Visit (INDEPENDENT_AMBULATORY_CARE_PROVIDER_SITE_OTHER): Admitting: Family Medicine

## 2023-08-10 ENCOUNTER — Encounter: Payer: Self-pay | Admitting: Family Medicine

## 2023-08-10 VITALS — BP 138/71 | HR 100 | Ht 71.0 in | Wt 328.2 lb

## 2023-08-10 DIAGNOSIS — R300 Dysuria: Secondary | ICD-10-CM | POA: Diagnosis not present

## 2023-08-10 DIAGNOSIS — E1122 Type 2 diabetes mellitus with diabetic chronic kidney disease: Secondary | ICD-10-CM

## 2023-08-10 DIAGNOSIS — E039 Hypothyroidism, unspecified: Secondary | ICD-10-CM

## 2023-08-10 DIAGNOSIS — Z79899 Other long term (current) drug therapy: Secondary | ICD-10-CM

## 2023-08-10 DIAGNOSIS — G8929 Other chronic pain: Secondary | ICD-10-CM | POA: Diagnosis not present

## 2023-08-10 DIAGNOSIS — E538 Deficiency of other specified B group vitamins: Secondary | ICD-10-CM

## 2023-08-10 DIAGNOSIS — Z794 Long term (current) use of insulin: Secondary | ICD-10-CM

## 2023-08-10 DIAGNOSIS — M5442 Lumbago with sciatica, left side: Secondary | ICD-10-CM

## 2023-08-10 DIAGNOSIS — N184 Chronic kidney disease, stage 4 (severe): Secondary | ICD-10-CM

## 2023-08-10 MED ORDER — CYANOCOBALAMIN 1000 MCG/ML IJ SOLN
1000.0000 ug | Freq: Once | INTRAMUSCULAR | Status: AC
  Administered 2023-08-10: 1000 ug via INTRAMUSCULAR

## 2023-08-10 NOTE — Progress Notes (Signed)
    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  Ports she had lab testing done there and the doctor was concerned about her kidney function.  Also noted her potassium was 6.0.  Feels her pain has significantly improved with opiate therapy.  Now is able to be more functional.  Planning to see the new neurosurgeon on Monday.  Reports the pain management doctor gave her the option to either start opiate therapy or continue her Ativan .  Patient opted to stop her Ativan  and she has now been off it for 9 days.  Stopped cold [place].  Has not had any withdrawal symptoms or seizures.  Still taking hydroxyzine  and fluoxetine .  Sees behavioral health, Dicie Foster.  PERTINENT  PMH / PSH: Sciatica with L sided radiculopathy, T2DM, bipolar depression, CHF, CKD3b/4   OBJECTIVE:   BP 138/71   Pulse 100   Ht 5\' 11"  (1.803 m)   Wt (!) 328 lb 4 oz (148.9 kg)   LMP 10/09/2022   SpO2 100%   BMI 45.78 kg/m    General: Alert, no apparent distress, well groomed.  Sitting up right in the chair.  Ambulating slowly with cane. HEENT: Normocephalic, atraumatic, moist mucus membranes, neck supple Respiratory: Normal respiratory effort GI: Non-distended Skin: No rashes, no jaundice Psych: Engaging appropriately with positive demeanor.  ASSESSMENT/PLAN:   Assessment & Plan Chronic bilateral low back pain with left-sided sciatica Significant symptomatic improvement after starting oxycodone  therapy with pain management.  Plan for continued follow-up and titration of medication to become more functional. -Completed work disability paperwork Type 2 diabetes mellitus with stage 4 chronic kidney disease, with long-term current use of insulin  (HCC) Not checking CBGs, goal to check and follow-up with diabetes management at follow-up. Hyperkalemia to 6.0 on lab work from [employer], suspicious for hemolysis as was previously normal.  Will repeat today but if persistently elevated in the setting of CKD 4 may consider referral to nephrology.  Patient already advised on avoidance of nephrotoxic agents. -Lipid panel, ACR and BMP  -Stop losartan  25 mg daily in the setting of possible hyperkalemia -A1c in 6 weeks Acquired hypothyroidism Annual TSH B12 deficiency Low in 05/2023, did not receive treatment. -B12 injection -Follow-up B12 level in 1 month Chronic prescription benzodiazepine use Self stopped lorazepam  1 mg twice daily as needed use about 9 days ago, no withdrawal symptoms per patient.  Will defer to psychiatry for additional management.   Dr. Jonne Netters, DO [facility]

## 2023-08-10 NOTE — Patient Instructions (Signed)
 It was wonderful to see you today! Thank you for choosing [facility].   Please bring ALL of your medications with you to every visit.   Today we talked about:  We will recheck your kidney function with both urine test and blood test today.  Will also check on your potassium and if it is high I will need to send in a medication and have you follow-up for repeat blood work.  Please stop taking the losartan  for now. We are giving you a B12 shot today since it was low at the neurologist.  We can follow-up for repeat testing in the next month or 2. I am glad that pain management is doing well for you!  Please continue to follow the regimen and follow-up with them for continued care.  Unfortunately I do not think there is alternative for your Ativan  besides taking the hydroxyzine  which you are already doing.  Please let me know if your mental health is worsening as we can always get you with another psychiatrist to help manage your symptoms further.  Please follow up in 6 weeks for repeat A1c  We are checking some labs today. If they are abnormal, I will call you. If they are normal, I will send you a MyChart message (if it is active) or a letter in the mail. If you do not hear about your labs in the next 2 weeks, please call the office.  Call the clinic at [phone] if your symptoms worsen or you have any concerns.  Please be sure to schedule follow up at the front desk before you leave today.   Jonne Netters, DO Family Medicine

## 2023-08-10 NOTE — Assessment & Plan Note (Signed)
 Annual TSH

## 2023-08-10 NOTE — Assessment & Plan Note (Signed)
 Significant symptomatic improvement after starting oxycodone  therapy with pain management.  Plan for continued follow-up and titration of medication to become more functional. -Completed work disability paperwork

## 2023-08-10 NOTE — Assessment & Plan Note (Signed)
 Not checking CBGs, goal to check and follow-up with diabetes management at follow-up. Hyperkalemia to 6.0 on lab work from [employer], suspicious for hemolysis as was previously normal.  Will repeat today but if persistently elevated in the setting of CKD 4 may consider referral to nephrology.  Patient already advised on avoidance of nephrotoxic agents. -Lipid panel, ACR and BMP  -Stop losartan  25 mg daily in the setting of possible hyperkalemia -A1c in 6 weeks

## 2023-08-11 ENCOUNTER — Telehealth (HOSPITAL_COMMUNITY): Payer: Self-pay | Admitting: *Deleted

## 2023-08-11 ENCOUNTER — Other Ambulatory Visit (HOSPITAL_COMMUNITY): Payer: Self-pay | Admitting: Psychiatry

## 2023-08-11 LAB — BASIC METABOLIC PANEL WITH GFR
BUN/Creatinine Ratio: 20 (ref 9–23)
BUN: 43 mg/dL — ABNORMAL HIGH (ref 6–24)
CO2: 21 mmol/L (ref 20–29)
Calcium: 9.8 mg/dL (ref 8.7–10.2)
Chloride: 106 mmol/L (ref 96–106)
Creatinine, Ser: 2.14 mg/dL — ABNORMAL HIGH (ref 0.57–1.00)
Glucose: 160 mg/dL — ABNORMAL HIGH (ref 70–99)
Potassium: 5.5 mmol/L — ABNORMAL HIGH (ref 3.5–5.2)
Sodium: 142 mmol/L (ref 134–144)
eGFR: 27 mL/min/{1.73_m2} — ABNORMAL LOW (ref >59)

## 2023-08-11 LAB — LIPID PANEL
Chol/HDL Ratio: 3.7 ratio (ref 0.0–4.4)
Cholesterol, Total: 242 mg/dL — ABNORMAL HIGH (ref 100–199)
HDL: 66 mg/dL (ref >39)
LDL Chol Calc (NIH): 156 mg/dL — ABNORMAL HIGH (ref 0–99)
Triglycerides: 113 mg/dL (ref 0–149)
VLDL Cholesterol Cal: 20 mg/dL (ref 5–40)

## 2023-08-11 LAB — TSH: TSH: 0.414 u[IU]/mL — ABNORMAL LOW (ref 0.450–4.500)

## 2023-08-11 MED ORDER — BENZTROPINE MESYLATE 0.5 MG PO TABS: 0.5000 mg | ORAL_TABLET | Freq: Two times a day (BID) | ORAL | 2 refills | Status: DC

## 2023-08-11 NOTE — Telephone Encounter (Signed)
 Patient notes that he has been having abnormal muscle movements in her hands and month. Provider unable to do an aims assessment but requested that she come in for a future appointment. She endorsed understanding and agreed. Patient was started on Cogentin 0.5 mg twice daily to help manage movements. Potential side effects of medication and risks vs benefits of treatment vs non-treatment were explained and discussed. All questions were answered. No other concerns noted at this time.

## 2023-08-11 NOTE — Telephone Encounter (Signed)
 States that she is having Tardive Dyskinesia, explains that her hands are shaking and has some mouth movements. Tongue not affected. She believes her Remeron  is causing it and states that it has happened before when she was on it. She wants a message sent to her MD to discuss.

## 2023-08-12 LAB — MICROALBUMIN / CREATININE URINE RATIO
Creatinine, Urine: 137.9 mg/dL
Microalb/Creat Ratio: 4 mg/g{creat} (ref 0–29)
Microalbumin, Urine: 6.2 ug/mL

## 2023-08-17 ENCOUNTER — Telehealth: Payer: Self-pay

## 2023-08-17 DIAGNOSIS — E1122 Type 2 diabetes mellitus with diabetic chronic kidney disease: Secondary | ICD-10-CM

## 2023-08-17 MED ORDER — ACCU-CHEK SOFTCLIX LANCETS MISC: 3 refills | Status: DC

## 2023-08-17 MED ORDER — ACCU-CHEK SOFTCLIX LANCET DEV KIT: PACK | 0 refills | Status: DC

## 2023-08-17 MED ORDER — ACCU-CHEK GUIDE W/DEVICE KIT: PACK | 0 refills | Status: DC

## 2023-08-17 NOTE — Telephone Encounter (Signed)
 Patient calls nurse line requesting DM testing supplies to her pharmacy.   She reports she what she "has on hand" has expired.   She reports testing TID.   Will forward to PCP to send in.

## 2023-08-19 MED ORDER — ACCU-CHEK GUIDE TEST VI STRP: ORAL_STRIP | 12 refills | Status: DC

## 2023-08-19 NOTE — Telephone Encounter (Signed)
 Patient calls nurse line. She states that pharmacy did not receive rx for testing strips.   Sent in prescription for testing strips to pharmacy.   Elsie Halo, RN

## 2023-08-19 NOTE — Addendum Note (Signed)
 Addended by: Destyne Goodreau C on: 08/19/2023 11:50 AM   Modules accepted: Orders

## 2023-09-08 ENCOUNTER — Telehealth: Payer: Self-pay | Admitting: Family Medicine

## 2023-09-08 NOTE — Telephone Encounter (Signed)
 Patient's son dropped off disability paperwork to be completed. Last DOS was 08/10/23. Placed in [employer].

## 2023-09-08 NOTE — Telephone Encounter (Signed)
 Placed in MDs box to be filled out. Dalon Reichart Bruna Potter, CMA

## 2023-09-12 ENCOUNTER — Other Ambulatory Visit: Payer: Self-pay | Admitting: Family Medicine

## 2023-09-12 DIAGNOSIS — K219 Gastro-esophageal reflux disease without esophagitis: Secondary | ICD-10-CM

## 2023-09-15 ENCOUNTER — Telehealth: Payer: Self-pay | Admitting: Family Medicine

## 2023-09-15 NOTE — Telephone Encounter (Signed)
-----   Message from Izetta Nap sent at 09/14/2023  9:46 AM EDT ----- Regarding: Appointment Can you please call patient to schedule an appointment to discuss lab results and for Pap smear?  Thanks!

## 2023-09-15 NOTE — Telephone Encounter (Signed)
 Patient called and informed that forms are ready for pick up. Copy made and placed in batch scanning. Original placed at front desk for pick up.   Veronda Prude, RN

## 2023-09-15 NOTE — Telephone Encounter (Signed)
 Called patient to schedule LVM to call back.   Appointment to discuss labs/PAP.   Thanks!

## 2023-10-22 ENCOUNTER — Telehealth (HOSPITAL_COMMUNITY): Payer: Self-pay | Admitting: Physician Assistant

## 2023-10-22 ENCOUNTER — Encounter (HOSPITAL_COMMUNITY): Payer: Self-pay

## 2023-10-28 ENCOUNTER — Other Ambulatory Visit: Payer: Self-pay | Admitting: Family Medicine

## 2023-10-28 DIAGNOSIS — I5042 Chronic combined systolic (congestive) and diastolic (congestive) heart failure: Secondary | ICD-10-CM

## 2023-11-01 ENCOUNTER — Telehealth: Payer: Self-pay | Admitting: Student

## 2023-11-01 ENCOUNTER — Other Ambulatory Visit: Payer: Self-pay

## 2023-11-01 ENCOUNTER — Observation Stay (HOSPITAL_COMMUNITY)
Admission: EM | Admit: 2023-11-01 | Discharge: 2023-11-04 | Disposition: A | Discharge status: Home Health | Attending: Family Medicine | Admitting: Family Medicine

## 2023-11-01 ENCOUNTER — Emergency Department (HOSPITAL_COMMUNITY)

## 2023-11-01 ENCOUNTER — Encounter (HOSPITAL_COMMUNITY): Payer: Self-pay

## 2023-11-01 DIAGNOSIS — R002 Palpitations: Secondary | ICD-10-CM | POA: Diagnosis present

## 2023-11-01 DIAGNOSIS — R29898 Other symptoms and signs involving the musculoskeletal system: Secondary | ICD-10-CM

## 2023-11-01 DIAGNOSIS — G8929 Other chronic pain: Secondary | ICD-10-CM | POA: Diagnosis not present

## 2023-11-01 DIAGNOSIS — E1122 Type 2 diabetes mellitus with diabetic chronic kidney disease: Secondary | ICD-10-CM | POA: Insufficient documentation

## 2023-11-01 DIAGNOSIS — N1832 Chronic kidney disease, stage 3b: Secondary | ICD-10-CM | POA: Diagnosis not present

## 2023-11-01 DIAGNOSIS — F419 Anxiety disorder, unspecified: Secondary | ICD-10-CM | POA: Diagnosis not present

## 2023-11-01 DIAGNOSIS — J45909 Unspecified asthma, uncomplicated: Secondary | ICD-10-CM | POA: Insufficient documentation

## 2023-11-01 DIAGNOSIS — G2401 Drug induced subacute dyskinesia: Secondary | ICD-10-CM | POA: Insufficient documentation

## 2023-11-01 DIAGNOSIS — I509 Heart failure, unspecified: Secondary | ICD-10-CM | POA: Insufficient documentation

## 2023-11-01 DIAGNOSIS — F3110 Bipolar disorder, current episode manic without psychotic features, unspecified: Secondary | ICD-10-CM | POA: Insufficient documentation

## 2023-11-01 DIAGNOSIS — D649 Anemia, unspecified: Secondary | ICD-10-CM | POA: Diagnosis not present

## 2023-11-01 DIAGNOSIS — G629 Polyneuropathy, unspecified: Secondary | ICD-10-CM

## 2023-11-01 DIAGNOSIS — R0602 Shortness of breath: Principal | ICD-10-CM

## 2023-11-01 DIAGNOSIS — I130 Hypertensive heart and chronic kidney disease with heart failure and stage 1 through stage 4 chronic kidney disease, or unspecified chronic kidney disease: Secondary | ICD-10-CM | POA: Insufficient documentation

## 2023-11-01 DIAGNOSIS — E039 Hypothyroidism, unspecified: Secondary | ICD-10-CM | POA: Diagnosis not present

## 2023-11-01 DIAGNOSIS — Z91040 Latex allergy status: Secondary | ICD-10-CM | POA: Diagnosis not present

## 2023-11-01 DIAGNOSIS — M6282 Rhabdomyolysis: Secondary | ICD-10-CM | POA: Diagnosis not present

## 2023-11-01 DIAGNOSIS — M792 Neuralgia and neuritis, unspecified: Secondary | ICD-10-CM | POA: Insufficient documentation

## 2023-11-01 DIAGNOSIS — Z794 Long term (current) use of insulin: Secondary | ICD-10-CM | POA: Insufficient documentation

## 2023-11-01 DIAGNOSIS — N179 Acute kidney failure, unspecified: Secondary | ICD-10-CM | POA: Diagnosis present

## 2023-11-01 DIAGNOSIS — M5432 Sciatica, left side: Secondary | ICD-10-CM

## 2023-11-01 DIAGNOSIS — Z789 Other specified health status: Secondary | ICD-10-CM

## 2023-11-01 LAB — CBC WITH DIFFERENTIAL/PLATELET
Abs Immature Granulocytes: 0.04 K/uL (ref 0.00–0.07)
Basophils Absolute: 0 K/uL (ref 0.0–0.1)
Basophils Relative: 1 %
Eosinophils Absolute: 0.1 K/uL (ref 0.0–0.5)
Eosinophils Relative: 2 %
HCT: 31.6 % — ABNORMAL LOW (ref 36.0–46.0)
Hemoglobin: 9.8 g/dL — ABNORMAL LOW (ref 12.0–15.0)
Immature Granulocytes: 1 %
Lymphocytes Relative: 27 %
Lymphs Abs: 2.4 K/uL (ref 0.7–4.0)
MCH: 26.9 pg (ref 26.0–34.0)
MCHC: 31 g/dL (ref 30.0–36.0)
MCV: 86.8 fL (ref 80.0–100.0)
Monocytes Absolute: 0.7 K/uL (ref 0.1–1.0)
Monocytes Relative: 8 %
Neutro Abs: 5.5 K/uL (ref 1.7–7.7)
Neutrophils Relative %: 61 %
Platelets: 420 K/uL — ABNORMAL HIGH (ref 150–400)
RBC: 3.64 MIL/uL — ABNORMAL LOW (ref 3.87–5.11)
RDW: 13.8 % (ref 11.5–15.5)
WBC: 8.8 K/uL (ref 4.0–10.5)
nRBC: 0 % (ref 0.0–0.2)

## 2023-11-01 LAB — D-DIMER, QUANTITATIVE: D-Dimer, Quant: 0.54 ug{FEU}/mL — ABNORMAL HIGH (ref 0.00–0.50)

## 2023-11-01 LAB — COMPREHENSIVE METABOLIC PANEL WITH GFR
ALT: 12 U/L (ref 0–44)
AST: 17 U/L (ref 15–41)
Albumin: 3.3 g/dL — ABNORMAL LOW (ref 3.5–5.0)
Alkaline Phosphatase: 59 U/L (ref 38–126)
Anion gap: 11 (ref 5–15)
BUN: 34 mg/dL — ABNORMAL HIGH (ref 6–20)
CO2: 23 mmol/L (ref 22–32)
Calcium: 9.1 mg/dL (ref 8.9–10.3)
Chloride: 109 mmol/L (ref 98–111)
Creatinine, Ser: 1.88 mg/dL — ABNORMAL HIGH (ref 0.44–1.00)
GFR, Estimated: 31 mL/min — ABNORMAL LOW (ref >60)
Glucose, Bld: 187 mg/dL — ABNORMAL HIGH (ref 70–99)
Potassium: 4.8 mmol/L (ref 3.5–5.1)
Sodium: 143 mmol/L (ref 135–145)
Total Bilirubin: 0.4 mg/dL (ref 0.0–1.2)
Total Protein: 7.3 g/dL (ref 6.5–8.1)

## 2023-11-01 LAB — RESP PANEL BY RT-PCR (RSV, FLU A&B, COVID)  RVPGX2
Influenza A by PCR: NEGATIVE
Influenza B by PCR: NEGATIVE
Resp Syncytial Virus by PCR: NEGATIVE
SARS Coronavirus 2 by RT PCR: NEGATIVE

## 2023-11-01 LAB — MAGNESIUM: Magnesium: 1.5 mg/dL — ABNORMAL LOW (ref 1.7–2.4)

## 2023-11-01 LAB — BRAIN NATRIURETIC PEPTIDE: B Natriuretic Peptide: 31.1 pg/mL (ref 0.0–100.0)

## 2023-11-01 LAB — HCG, SERUM, QUALITATIVE: Preg, Serum: NEGATIVE

## 2023-11-01 LAB — TROPONIN I (HIGH SENSITIVITY)
Troponin I (High Sensitivity): 9 ng/L (ref <18)
Troponin I (High Sensitivity): 9 ng/L (ref <18)

## 2023-11-01 MED ORDER — ONDANSETRON HCL 4 MG/2ML IJ SOLN
4.0000 mg | Freq: Once | INTRAMUSCULAR | Status: AC
  Administered 2023-11-01: 4 mg via INTRAVENOUS
  Filled 2023-11-01: qty 2

## 2023-11-01 MED ORDER — MAGNESIUM SULFATE 2 GM/50ML IV SOLN
2.0000 g | Freq: Once | INTRAVENOUS | Status: AC
  Administered 2023-11-02: 2 g via INTRAVENOUS
  Filled 2023-11-01: qty 50

## 2023-11-01 MED ORDER — OXYCODONE-ACETAMINOPHEN 5-325 MG PO TABS
1.0000 | ORAL_TABLET | Freq: Four times a day (QID) | ORAL | Status: DC | PRN
  Administered 2023-11-02 – 2023-11-04 (×9): 1 via ORAL
  Filled 2023-11-01 (×9): qty 1

## 2023-11-01 MED ORDER — LOSARTAN POTASSIUM 25 MG PO TABS
25.0000 mg | ORAL_TABLET | Freq: Every day | ORAL | Status: DC
  Administered 2023-11-02 – 2023-11-03 (×3): 25 mg via ORAL
  Filled 2023-11-01 (×3): qty 1

## 2023-11-01 MED ORDER — FUROSEMIDE 10 MG/ML IJ SOLN
40.0000 mg | Freq: Once | INTRAMUSCULAR | Status: AC
  Administered 2023-11-01: 40 mg via INTRAVENOUS
  Filled 2023-11-01: qty 4

## 2023-11-01 MED ORDER — MIRTAZAPINE 15 MG PO TABS
15.0000 mg | ORAL_TABLET | Freq: Every day | ORAL | Status: DC
  Administered 2023-11-02 – 2023-11-03 (×3): 15 mg via ORAL
  Filled 2023-11-01 (×3): qty 1

## 2023-11-01 MED ORDER — FLUTICASONE PROPIONATE 50 MCG/ACT NA SUSP
1.0000 | Freq: Every day | NASAL | Status: DC
  Administered 2023-11-02 – 2023-11-04 (×3): 1 via NASAL
  Filled 2023-11-01 (×2): qty 16

## 2023-11-01 MED ORDER — ENOXAPARIN SODIUM 80 MG/0.8ML IJ SOSY
75.0000 mg | PREFILLED_SYRINGE | INTRAMUSCULAR | Status: DC
  Administered 2023-11-03: 75 mg via SUBCUTANEOUS
  Filled 2023-11-01: qty 0.75
  Filled 2023-11-01: qty 0.8

## 2023-11-01 MED ORDER — FLUOXETINE HCL 20 MG PO CAPS
40.0000 mg | ORAL_CAPSULE | Freq: Every day | ORAL | Status: DC
  Administered 2023-11-02 – 2023-11-04 (×3): 40 mg via ORAL
  Filled 2023-11-01 (×3): qty 2

## 2023-11-01 MED ORDER — ENOXAPARIN SODIUM 40 MG/0.4ML IJ SOSY: 40.0000 mg | PREFILLED_SYRINGE | INTRAMUSCULAR | Status: DC

## 2023-11-01 MED ORDER — INSULIN GLARGINE 100 UNIT/ML ~~LOC~~ SOLN
65.0000 [IU] | Freq: Two times a day (BID) | SUBCUTANEOUS | Status: DC
  Administered 2023-11-02 – 2023-11-04 (×6): 65 [IU] via SUBCUTANEOUS
  Filled 2023-11-01 (×10): qty 0.65

## 2023-11-01 MED ORDER — LIDOCAINE 5 % EX PTCH
1.0000 | MEDICATED_PATCH | CUTANEOUS | Status: DC
  Administered 2023-11-03: 1 via TRANSDERMAL
  Filled 2023-11-01: qty 1

## 2023-11-01 MED ORDER — ALBUTEROL SULFATE (2.5 MG/3ML) 0.083% IN NEBU
2.5000 mg | INHALATION_SOLUTION | Freq: Four times a day (QID) | RESPIRATORY_TRACT | Status: DC | PRN
  Administered 2023-11-02 – 2023-11-04 (×3): 2.5 mg via RESPIRATORY_TRACT
  Filled 2023-11-01 (×3): qty 3

## 2023-11-01 MED ORDER — MORPHINE SULFATE (PF) 4 MG/ML IV SOLN
4.0000 mg | Freq: Once | INTRAVENOUS | Status: AC
  Administered 2023-11-01: 4 mg via INTRAVENOUS
  Filled 2023-11-01: qty 1

## 2023-11-01 MED ORDER — ISOSORB DINITRATE-HYDRALAZINE 20-37.5 MG PO TABS
1.0000 | ORAL_TABLET | Freq: Three times a day (TID) | ORAL | Status: DC
  Administered 2023-11-02 – 2023-11-04 (×8): 1 via ORAL
  Filled 2023-11-01 (×8): qty 1

## 2023-11-01 MED ORDER — INSULIN GLARGINE-YFGN 100 UNIT/ML ~~LOC~~ SOLN: 65.0000 [IU] | Freq: Two times a day (BID) | SUBCUTANEOUS | Status: DC

## 2023-11-01 MED ORDER — HYDROXYZINE HCL 25 MG PO TABS
50.0000 mg | ORAL_TABLET | Freq: Three times a day (TID) | ORAL | Status: DC | PRN
  Administered 2023-11-02 – 2023-11-04 (×4): 50 mg via ORAL
  Filled 2023-11-01 (×5): qty 2

## 2023-11-01 MED ORDER — QUETIAPINE FUMARATE 100 MG PO TABS
100.0000 mg | ORAL_TABLET | Freq: Every day | ORAL | Status: DC
  Administered 2023-11-02 – 2023-11-03 (×3): 100 mg via ORAL
  Filled 2023-11-01 (×3): qty 1

## 2023-11-01 MED ORDER — PANTOPRAZOLE SODIUM 40 MG PO TBEC
40.0000 mg | DELAYED_RELEASE_TABLET | Freq: Every day | ORAL | Status: DC
  Administered 2023-11-02 – 2023-11-04 (×3): 40 mg via ORAL
  Filled 2023-11-01 (×3): qty 1

## 2023-11-01 MED ORDER — METOPROLOL SUCCINATE ER 50 MG PO TB24
50.0000 mg | ORAL_TABLET | Freq: Every day | ORAL | Status: DC
Start: 2023-11-02 — End: 2023-11-04
  Administered 2023-11-02 – 2023-11-04 (×3): 50 mg via ORAL
  Filled 2023-11-01: qty 1
  Filled 2023-11-01: qty 2
  Filled 2023-11-01: qty 1

## 2023-11-01 MED ORDER — BENZTROPINE MESYLATE 0.5 MG PO TABS
0.5000 mg | ORAL_TABLET | Freq: Two times a day (BID) | ORAL | Status: DC
  Administered 2023-11-02 – 2023-11-04 (×6): 0.5 mg via ORAL
  Filled 2023-11-01 (×8): qty 1

## 2023-11-01 NOTE — ED Triage Notes (Signed)
 Pt coming in from home p[t complaining of bilateral leg edema . Pt reports palpitations x 2 week. Pt has been unable to get medications due to recent financial issues.    140/101 Pulse 110 Cbg 195 98% ra 20 lac

## 2023-11-01 NOTE — ED Notes (Signed)
 Patient ambulated to restroom and back to bed. Patient SpO2 remained >90%.

## 2023-11-01 NOTE — Telephone Encounter (Signed)
**  After Hours/ Emergency Line Call**  Received a page to call [phone]) - [phone].  Patient: [name]  Caller: Self  Confirmed name & DOB of patient with caller  Subjective:  [name] is a 53 year old female with past med history significant for: Congestive heart failure, morbid obesity, asthma, hypothyroidism.  She reports that she has been without insurance since 08/28/2023, and just had it renewed.  For at least 7 days she was without medications, she reports she was without all of her medications including inhalers/diuretics/antihypertensives/insulin /diuretics.  For the past week she has had worsening shortness of breath, leg swelling and palpitations.  She picked up her medications this Thursday, hoping that getting back on her medications would resolve her symptoms however they have progressed.  Observations: NAD, speaking in short sentences  Assessment & Plan  [name] is a 52 y.o. female with PMHx s/f congestive heart failure (LVEF 35-40%), morbid obesity, asthma, hypothyroidism, type 2 diabetes who calls with the following complaints and concerns: Shortness of breath, painful leg edema, palpitations.  Differential is broad includes: Congestive heart failure exacerbation, asthma exacerbation, new onset arrhythmia.  Given her shortness of breath on the phone, I urged patient to go to the emergency room.  I offered to call patient in ambulance, however she declined x 2.  She requested appointment for tomorrow, I made this appointment but again urged to go to the emergency room.  She then agreed to transport herself to the emergency room immediately.  Appointment for tomorrow was kept in case patient does not go to the emergency department.  Recommendations:  Transport to emergency room If patient does not go and calls us  back, recommend double up on diuretics  -- Red flags discussed.  -- Will forward to PCP.  Gladis Church, DO [facility],  PGY-2

## 2023-11-01 NOTE — H&P (Shared)
 Hospital Admission History and Physical Service Pager: [phone]  Patient name: [name] Medical record number: [id number] Date of Birth: [birth year] Age: 53 y.o. Gender: female  Primary Care Provider: Theophilus Pagan, MD Consultants: None Code Status: Full  Preferred Emergency Contact: Harden Sharps (sister) [phone]   Chief Complaint: CP, b/l LE edema, palpitations  Assessment and Plan: [name] is a 53 y.o. female presenting with CP, SOB, b/l LE edema and palpitations. Differential for presentation of this includes CHF exacerbation vs. anemia vs. MI vs. DVT/PE vs. PNA. Clinically consistent with CHF. Anemia may also be contributing to presentation with decrease in hemoglobin. MI less likely given normal trops and EKG without evidence of STEMI. DVT/PE less likely given LE pain is not unilateral and equivocal D-dimer. PNA less likely given normal CXR and no infectious sx.  Assessment & Plan Acute heart failure (HCC) BNP 31.1. No crackles or edema on exam but given patient has been without medications for 2 months, sx are likely mild CHF exacerbation. - Admit to [facility] Medicine Teaching service, attending Dr. Anders, med-tele - Strict I/Os - Dailly weights - Continuous cardiac monitoring - AM BMP, Mg - S/p 40mg  IV lasix  - Redose lasix  in AM - Continue home metoprolol  (no evidence of low output state), bidil  - Restart Losartan  25mg  - Goal Mag >2, K >4, replete as indicated Anemia Normocytic, likely component secondary chronic renal disease. Hgb 9, baseline ~12. - Iron, TIBC, Ferritin Chronic health problem HTN - continue home bidil , metoprolol , losartan  Asthma - continue home albuterol  Hypothyroidism -  holding home levothyroxine  pending TSH T2DM - continue home Lanuts 65u bid, moderate sliding scale, cbg checks with meals and at night Sciatica w/ L-sided radiculopathy - continue home lidocaine  patch, oxycodone , holding gabapentin  given Cr 1.88 Bipolar  depression  - continue home fluoxetine  Anxiety - continue home mirtazapine  Tardive dyskinesia - continue home benztropine   FEN/GI: Heart healthy/carb modified VTE Prophylaxis: Lovenox   Disposition: Cards tele  History of Present Illness:  [name] is a 53 y.o. female presenting with CP, SOB, b/l LE edema and palpitations. Of note, patient did lose her insurance about 2 months ago and has been out of all medications until this past Thursday. She called the emergency after hours line this afternoon about these sx and was advised to present to the ED. SOB improves with sitting up. Swelling in ankles and feet has gradually worsened over the past 2 weeks and is now painful, making it difficult to walk. Swelling and pain improve with laying down. Patient does note that she has neuropathy and extensive lumbar sciatic pain that she has seen neurology for. This is why she walks with her a cane. Palpitations have been ongoing for a long time but increased in frequency today. Patient also reports cough x4 wks but believes this is due to allergies. Patient endorses sore throat. Denies sick contacts.  In the ED, patient was hemodynamically stable on RA with HR 102. Workup was significant for Mg 1.5. She was given IV Lasix  40mg  for her sx.  Review Of Systems: Per HPI with the following additions: None  Pertinent Past Medical History: CHF HTN Asthma Hypothyroidism T2DM CKD Stage 3b/4a Sciatica w/ Lpsided radiculopathy Bipolar depression Obesity  Remainder reviewed in history tab.   Pertinent Past Surgical History: C-section Ankle surgery 2017 Cholecystectomy  Remainder reviewed in history tab.   Pertinent Social History: Tobacco use: Never Alcohol use: None Other Substance use: None Lives with son  Pertinent Family  History: Father - COPD, T2DM, HTN Mother - Lupus, T2DM, hx of stroke  Remainder reviewed in history tab.   Important Outpatient Medications: Benztropine  0.5mg -  taking for tardive dyskinesia for long term psych meds Prozac  40mg  Lasix  40mg  Gabapentin  600 4 times per day Hydroxyzine  50mg  TID prn Lantus  65u twice daily Novolog  15u breakfast 20 dinner Bidil  20-37.5 mg Lantus  100u Synthroid  300 mcg - out for awhile Losartan  25mg  - stopped Robaxin  400mg  Metoprolol  50mg  Mirtazapine  15mg  Novolog  100u Oxycodone -acetaminophen  5-325mg  Protonix  40mg  Quetiapine  100mg  Rosuvastatin  20mg  - not taking Mounjaro  12.5mg  Albuterol   Not taking Wixela inhaler  Remainder reviewed in medication history.   Objective: BP (!) 132/119   Pulse (!) 106   Temp 98.5 F (36.9 C) (Oral)   Resp 18   Ht 5' 11 (1.803 m)   Wt (!) 148.9 kg   SpO2 97%   BMI 45.78 kg/m   Exam: General: Alert, tearful on exam, NAD. Cardiovascular: RRR, S1/S2 normal. No murmurs, rubs, or gallops appreciated. Femoral/radial pulse +2 bilaterally Pulmonary: Normal work of breathing. CTAB with no wheezes or crackles present Abdomen: Soft, non-tender, non-distended. Extremities: Warm and well-perfused, without cyanosis or edema. Neurologic: No focal deficits Skin: No rashes or lesions. Psych: Appropriate mood and affect  Labs:  CBC BMET  Recent Labs  Lab 11/01/23 1743  WBC 8.8  HGB 9.8*  HCT 31.6*  PLT 420*   Recent Labs  Lab 11/01/23 1743  NA 143  K 4.8  CL 109  CO2 23  BUN 34*  CREATININE 1.88*  GLUCOSE 187*  CALCIUM  9.1     Pertinent additional labs: Troponin 9, stable on repeat BNP 31.1   EKG: sinus tachy but otherwise normal  Imaging Studies Performed: CXR: No active cardiopulmonary disease.  Jerrie Gathers, DO 11/01/2023, 10:14 PM PGY-1, [facility]  FPTS Intern pager: [phone], text pages welcome Secure chat group [facility] Teaching Service   I have verified that the resident's  findings are accurately documented in the resident's note. I have made edits and changes where appropriate, and agree with  plan.  Ozell Provencal, MD, PGY-3 [facility] 6:19 AM 11/02/2023

## 2023-11-01 NOTE — ED Provider Notes (Signed)
 [place] EMERGENCY DEPARTMENT AT [facility] Provider Note   CSN: [id number] Arrival date & time: 11/01/23  1652    Patient presents with: Leg Swelling and Palpitations   [name] is a 53 y.o. female here for evaluation of chest pain, bilateral lower extremity edema, palpitations.  Sounds like she was lost to follow-up due to losing insurance.  Refilled her prescriptions on Friday however despite going back on her medications she continues to feel short of breath.  Her chest pain is intermittent in nature.  States she is having to sleep with 4 pillows at night.  She feels like her bilateral legs are swollen.  She notes she has chronic sciatica on her left which remains unchanged.  No numbness.  She feels generally weak. Feels like she has palpitations, like her heart is skipping a beat.  She has known CHF.  She has no history of PE or DVT.  She has had a cough over the last month which is productive of white sputum.  States she cannot sleep at night due to her to her shortness of breath.  She is very tearful in room.      HPI     Prior to Admission medications   Medication Sig Start Date End Date Taking? Authorizing Provider  albuterol  (VENTOLIN  HFA) 108 (90 Base) MCG/ACT inhaler Inhale 2 puffs into the lungs every 6 (six) hours as needed for shortness of breath. 09/23/22  Yes Theophilus Pagan, MD  gabapentin  (NEURONTIN ) 300 MG capsule Take 1 tablet in the AM, two at lunch and two before bed Patient taking differently: Take 600 mg by mouth 4 (four) times daily. 07/03/23  Yes Theophilus Pagan, MD  hydrOXYzine  (ATARAX ) 50 MG tablet Take 1 tablet (50 mg total) by mouth 3 (three) times daily as needed for anxiety. 08/05/23  Yes Harl Regan E, NP  insulin  aspart (NOVOLOG  FLEXPEN) 100 UNIT/ML FlexPen Inject 15-20 Units into the skin in the morning and at bedtime. Take 15 units at lunch and Take 20 units at dinner   Yes [provider]  LANTUS  SOLOSTAR 100 UNIT/ML  Solostar Pen INJECT 65 UNITS SUBCUTANEOUSLY TWICE DAILY 07/21/23  Yes Theophilus Pagan, MD  levothyroxine  (SYNTHROID ) 300 MCG tablet Take 1 tablet (300 mcg total) by mouth daily at 6 (six) AM. 05/20/23  Yes Theophilus Pagan, MD  Accu-Chek Softclix Lancets lancets Check blood sugar once daily 08/17/23   Theophilus Pagan, MD  benztropine  (COGENTIN ) 0.5 MG tablet Take 1 tablet (0.5 mg total) by mouth 2 (two) times daily. 08/11/23   Harl Regan BRAVO, NP  Blood Glucose Monitoring Suppl (ACCU-CHEK GUIDE) w/Device KIT Check blood sugar once daily 08/17/23   Theophilus Pagan, MD  FLUoxetine  (PROZAC ) 40 MG capsule Take 1 capsule (40 mg total) by mouth daily. 08/05/23   Harl Regan BRAVO, NP  fluticasone  (FLONASE ) 50 MCG/ACT nasal spray Place 1 spray into both nostrils daily. 1 spray in each nostril every day 06/10/21   Espinoza, Alejandra, DO  fluticasone -salmeterol (WIXELA INHUB) 250-50 MCG/ACT AEPB Inhale 1 puff into the lungs in the morning and at bedtime. Patient taking differently: Inhale 1 puff into the lungs 2 (two) times daily as needed. 09/23/22   Theophilus Pagan, MD  furosemide  (LASIX ) 40 MG tablet Take 1 tablet by mouth once daily 10/29/23   Theophilus Pagan, MD  glucose blood (ACCU-CHEK GUIDE TEST) test strip Please use to check blood sugar levels once daily. 08/19/23   Theophilus Pagan, MD  Insulin  Pen Needle 32G X  4 MM MISC 1 each by Does not apply route 3 (three) times daily. 04/23/22   Espinoza, Alejandra, DO  isosorbide -hydrALAZINE  (BIDIL ) 20-37.5 MG tablet Take 1 tablet by mouth 3 (three) times daily. 03/12/23   Theophilus Pagan, MD  Lancets Misc. (ACCU-CHEK SOFTCLIX LANCET DEV) KIT Check blood sugar once daily 08/17/23   Theophilus Pagan, MD  lidocaine  (LIDODERM ) 5 % Place 1 patch onto the skin daily. Remove & Discard patch within 12 hours or as directed by MD 07/07/23   Meshilem Machuca A, PA-C  losartan  (COZAAR ) 25 MG tablet Take 1 tablet (25 mg total) by mouth at bedtime. 09/12/22   Theophilus Pagan, MD   methocarbamol  (ROBAXIN ) 500 MG tablet TAKE 2 TABLETS BY MOUTH THREE TIMES DAILY 07/21/23   Theophilus Pagan, MD  metoprolol  succinate (TOPROL -XL) 50 MG 24 hr tablet Take 1 tablet (50 mg total) by mouth at bedtime. Take with or immediately following a meal. Patient taking differently: Take 50 mg by mouth daily. Take with or immediately following a meal. 03/12/23   Theophilus Pagan, MD  mirtazapine  (REMERON ) 15 MG tablet Take 1 tablet (15 mg total) by mouth at bedtime. Decrease by half in 2 weeks and then discontinue.  Weight gaining medication. 08/05/23   Harl Zane BRAVO, NP  NOVOLOG  FLEXPEN 100 UNIT/ML FlexPen INJECT 15 UNITS SUBCUTANEOUSLY WITH LUNCH AND INJECT 20 UNITS WITH SUPPER 02/20/23   Theophilus Pagan, MD  oxyCODONE -acetaminophen  (PERCOCET/ROXICET) 5-325 MG tablet Take 1 tablet by mouth every 6 (six) hours as needed for severe pain (pain score 7-10).    [provider]  pantoprazole  (PROTONIX ) 40 MG tablet Take 1 tablet by mouth once daily 09/14/23   Theophilus Pagan, MD  QUEtiapine  (SEROQUEL ) 100 MG tablet Take 1 tablet (100 mg total) by mouth at bedtime. 08/05/23   Harl Zane BRAVO, NP  rosuvastatin  (CRESTOR ) 20 MG tablet Take 1 tablet (20 mg total) by mouth daily. 02/26/22   Clapacs, Norleen DASEN, MD  tirzepatide  (MOUNJARO ) 12.5 MG/0.5ML Pen Inject 12.5 mg into the skin once a week. 07/31/23   Theophilus Pagan, MD  ipratropium-albuterol  (DUONEB) 0.5-2.5 (3) MG/3ML SOLN Take 3 mLs by nebulization 3 (three) times daily as needed. Patient taking differently: Take 3 mLs by nebulization 3 (three) times daily as needed (sob/wheezing). 10/12/20 05/21/21  Nivia Colon, PA-C    Allergies: Ace inhibitors, Haldol  [haloperidol  lactate], Nsaids, Entresto  [sacubitril-valsartan], and Latex    Review of Systems  Constitutional:  Positive for activity change and fatigue.  HENT: Negative.    Respiratory:  Positive for cough and shortness of breath.   Cardiovascular:  Positive for chest pain,  palpitations and leg swelling.  Gastrointestinal: Negative.   Genitourinary: Negative.   Musculoskeletal:  Positive for back pain. Negative for neck pain and neck stiffness.  Neurological:  Positive for weakness (gen weakness).    Updated Vital Signs BP 112/83   Pulse 97   Temp 98.5 F (36.9 C) (Oral)   Resp 14   Ht 5' 11 (1.803 m)   Wt (!) 148.9 kg   SpO2 98%   BMI 45.78 kg/m   Physical Exam Vitals and nursing note reviewed.  Constitutional:      General: She is not in acute distress.    Appearance: She is well-developed. She is obese. She is not ill-appearing, toxic-appearing or diaphoretic.  HENT:     Head: Normocephalic and atraumatic.     Nose: Nose normal.     Mouth/Throat:     Mouth: Mucous membranes are moist.  Eyes:     Pupils: Pupils are equal, round, and reactive to light.  Cardiovascular:     Rate and Rhythm: Normal rate.     Pulses: Normal pulses.          Radial pulses are 2+ on the right side and 2+ on the left side.       Dorsalis pedis pulses are 2+ on the right side and 2+ on the left side.     Heart sounds: Normal heart sounds.  Pulmonary:     Effort: Pulmonary effort is normal. No respiratory distress.     Breath sounds: Normal breath sounds.  Abdominal:     General: Bowel sounds are normal. There is no distension.     Palpations: Abdomen is soft.     Tenderness: There is no abdominal tenderness. There is no right CVA tenderness, left CVA tenderness, guarding or rebound.  Musculoskeletal:        General: Normal range of motion.     Cervical back: Normal range of motion.     Comments: No bony tenderness, compartments soft.  Trace pitting edema to knees bilaterally  Skin:    General: Skin is warm and dry.  Neurological:     General: No focal deficit present.     Mental Status: She is alert.  Psychiatric:        Mood and Affect: Mood normal.     (all labs ordered are listed, but only abnormal results are displayed) Labs Reviewed  CBC WITH  DIFFERENTIAL/PLATELET - Abnormal; Notable for the following components:      Result Value   RBC 3.64 (*)    Hemoglobin 9.8 (*)    HCT 31.6 (*)    Platelets 420 (*)    All other components within normal limits  COMPREHENSIVE METABOLIC PANEL WITH GFR - Abnormal; Notable for the following components:   Glucose, Bld 187 (*)    BUN 34 (*)    Creatinine, Ser 1.88 (*)    Albumin  3.3 (*)    GFR, Estimated 31 (*)    All other components within normal limits  MAGNESIUM  - Abnormal; Notable for the following components:   Magnesium  1.5 (*)    All other components within normal limits  D-DIMER, QUANTITATIVE - Abnormal; Notable for the following components:   D-Dimer, Quant 0.54 (*)    All other components within normal limits  RESP PANEL BY RT-PCR (RSV, FLU A&B, COVID)  RVPGX2  BRAIN NATRIURETIC PEPTIDE  HCG, SERUM, QUALITATIVE  BASIC METABOLIC PANEL WITH GFR  MAGNESIUM   TSH  IRON AND TIBC  FERRITIN  TROPONIN I (HIGH SENSITIVITY)  TROPONIN I (HIGH SENSITIVITY)    EKG: None  Radiology: DG Chest 2 View Result Date: 11/01/2023 CLINICAL DATA:  Chest pain and shortness of breath. EXAM: CHEST - 2 VIEW COMPARISON:  Chest radiograph dated 10/21/2022. FINDINGS: The heart size and mediastinal contours are within normal limits. Both lungs are clear. The visualized skeletal structures are unremarkable. IMPRESSION: No active cardiopulmonary disease. Electronically Signed   By: Vanetta Chou M.D.   On: 11/01/2023 18:41     Procedures   Medications Ordered in the ED  oxyCODONE -acetaminophen  (PERCOCET/ROXICET) 5-325 MG per tablet 1 tablet (has no administration in time range)  isosorbide -hydrALAZINE  (BIDIL ) 20-37.5 MG per tablet 1 tablet (has no administration in time range)  losartan  (COZAAR ) tablet 25 mg (has no administration in time range)  metoprolol  succinate (TOPROL -XL) 24 hr tablet 50 mg (has no administration in time range)  FLUoxetine  (PROZAC )  capsule 40 mg (has no administration in  time range)  hydrOXYzine  (ATARAX ) tablet 50 mg (has no administration in time range)  mirtazapine  (REMERON ) tablet 15 mg (has no administration in time range)  QUEtiapine  (SEROQUEL ) tablet 100 mg (has no administration in time range)  insulin  glargine-yfgn (SEMGLEE ) injection 65 Units (has no administration in time range)  pantoprazole  (PROTONIX ) EC tablet 40 mg (has no administration in time range)  benztropine  (COGENTIN ) tablet 0.5 mg (has no administration in time range)  albuterol  (VENTOLIN  HFA) 108 (90 Base) MCG/ACT inhaler 2 puff (has no administration in time range)  fluticasone  (FLONASE ) 50 MCG/ACT nasal spray 1 spray (has no administration in time range)  lidocaine  (LIDODERM ) 5 % 1 patch (has no administration in time range)  enoxaparin  (LOVENOX ) injection 40 mg (has no administration in time range)  magnesium  sulfate IVPB 2 g 50 mL (has no administration in time range)  ondansetron  (ZOFRAN ) injection 4 mg (4 mg Intravenous Given 11/01/23 1743)  morphine  (PF) 4 MG/ML injection 4 mg (4 mg Intravenous Given 11/01/23 1744)  furosemide  (LASIX ) injection 40 mg (40 mg Intravenous Given 11/01/23 2013)   53L here for evaluation of chest pain, shortness of breath, lower extremity edema, chronic back pain and palpitations.  States she was lost to follow-up due to lack of insurance and inability to fill her medications over the last few months.  Refilled them on Thursday has noted since then she has had progressive bilateral lower extremity edema, palpitations and chest pain.  Chest pain is nonexertional, nonpleuritic in nature.  She has known chronic sciatica.  She states she is having worsening pain.  She has no neuropathy to her feet.  She is sleeping with 4 pillows.  She has a cough over the last month.  No fever, IVDU, bowel or bladder incontinence, saddle paresthesia.  Here patient is afebrile, mild tachycardia without tachypnea.  She has no history of PE or DVT.  She has some trace pitting edema to  lower extremities however does not appear significantly volume overloaded.  I suspect her symptoms are likely multifactorial.  Will plan on labs, imaging and reassess  Labs and imaging personally viewed and interpreted:  CBC without significant abnormality Metabolic panel creatinine 1.88 similar to prior Magnesium  1.5 Trop9--9 delta flat BNP 31--body habitus could play factor for normal results D-dimer 0.54, age-adjusted normal COVID, flu, RSV negative Pregnancy test negative Chest x-ray without significant abnormality EKG not crossing over into computer shows sinus tachycardia  Patient reassessed.  Ambulatory without any hypoxia or did become tachypneic.  Will touch base with family medicine practice as they follow her outpatient and sent her in today  Discussed with family medicine resident.  Agreeable to evaluate patient for admission.  The patient appears reasonably stabilized for admission considering the current resources, flow, and capabilities available in the ED at this time, and I doubt any other [facility] requiring further screening and/or treatment in the ED prior to admission.                                   Medical Decision Making Amount and/or Complexity of Data Reviewed Independent Historian: EMS External Data Reviewed: labs, radiology, ECG and notes. Labs: ordered. Decision-making details documented in ED Course. Radiology: ordered and independent interpretation performed. Decision-making details documented in ED Course. ECG/medicine tests: ordered and independent interpretation performed. Decision-making details documented in ED Course.  Risk OTC drugs. Prescription drug management.  Parenteral controlled substances. Decision regarding hospitalization. Diagnosis or treatment significantly limited by social determinants of health.        Final diagnoses:  Palpitations  SOB (shortness of breath)    ED Discharge Orders     None          Tiersa Dayley,  Donique Hammonds A, PA-C 11/01/23 2337    Tegeler, Lonni PARAS, MD 11/02/23 [phone]

## 2023-11-02 ENCOUNTER — Other Ambulatory Visit (HOSPITAL_COMMUNITY): Payer: Self-pay

## 2023-11-02 ENCOUNTER — Encounter (HOSPITAL_COMMUNITY): Payer: Self-pay

## 2023-11-02 ENCOUNTER — Ambulatory Visit: Payer: Self-pay

## 2023-11-02 ENCOUNTER — Telehealth (HOSPITAL_COMMUNITY): Payer: Self-pay

## 2023-11-02 DIAGNOSIS — R0602 Shortness of breath: Secondary | ICD-10-CM

## 2023-11-02 DIAGNOSIS — R29898 Other symptoms and signs involving the musculoskeletal system: Secondary | ICD-10-CM | POA: Diagnosis not present

## 2023-11-02 DIAGNOSIS — Z789 Other specified health status: Secondary | ICD-10-CM

## 2023-11-02 DIAGNOSIS — D649 Anemia, unspecified: Secondary | ICD-10-CM

## 2023-11-02 LAB — HEMOGLOBIN A1C
Hgb A1c MFr Bld: 9.7 % — ABNORMAL HIGH (ref 4.8–5.6)
Mean Plasma Glucose: 231.69 mg/dL

## 2023-11-02 LAB — BASIC METABOLIC PANEL WITH GFR
Anion gap: 8 (ref 5–15)
BUN: 33 mg/dL — ABNORMAL HIGH (ref 6–20)
CO2: 21 mmol/L — ABNORMAL LOW (ref 22–32)
Calcium: 8.9 mg/dL (ref 8.9–10.3)
Chloride: 111 mmol/L (ref 98–111)
Creatinine, Ser: 1.87 mg/dL — ABNORMAL HIGH (ref 0.44–1.00)
GFR, Estimated: 32 mL/min — ABNORMAL LOW (ref >60)
Glucose, Bld: 202 mg/dL — ABNORMAL HIGH (ref 70–99)
Potassium: 4.3 mmol/L (ref 3.5–5.1)
Sodium: 140 mmol/L (ref 135–145)

## 2023-11-02 LAB — IRON AND TIBC
Iron: 23 ug/dL — ABNORMAL LOW (ref 28–170)
Saturation Ratios: 6 % — ABNORMAL LOW (ref 10.4–31.8)
TIBC: 367 ug/dL (ref 250–450)
UIBC: 344 ug/dL

## 2023-11-02 LAB — GLUCOSE, CAPILLARY: Glucose-Capillary: 139 mg/dL — ABNORMAL HIGH (ref 70–99)

## 2023-11-02 LAB — MAGNESIUM: Magnesium: 1.5 mg/dL — ABNORMAL LOW (ref 1.7–2.4)

## 2023-11-02 LAB — FERRITIN: Ferritin: 14 ng/mL (ref 11–307)

## 2023-11-02 LAB — CBG MONITORING, ED
Glucose-Capillary: 199 mg/dL — ABNORMAL HIGH (ref 70–99)
Glucose-Capillary: 190 mg/dL — ABNORMAL HIGH (ref 70–99)

## 2023-11-02 LAB — TSH: TSH: 17.394 u[IU]/mL — ABNORMAL HIGH (ref 0.350–4.500)

## 2023-11-02 MED ORDER — FERROUS SULFATE 325 (65 FE) MG PO TABS
325.0000 mg | ORAL_TABLET | Freq: Every day | ORAL | Status: DC
  Administered 2023-11-02 – 2023-11-04 (×3): 325 mg via ORAL
  Filled 2023-11-02 (×3): qty 1

## 2023-11-02 MED ORDER — FUROSEMIDE 40 MG PO TABS
40.0000 mg | ORAL_TABLET | Freq: Every day | ORAL | Status: DC
  Administered 2023-11-02 – 2023-11-04 (×3): 40 mg via ORAL
  Filled 2023-11-02: qty 1
  Filled 2023-11-02: qty 2
  Filled 2023-11-02: qty 1

## 2023-11-02 MED ORDER — LEVOTHYROXINE SODIUM 100 MCG PO TABS
300.0000 ug | ORAL_TABLET | Freq: Every day | ORAL | Status: DC
  Administered 2023-11-02 – 2023-11-04 (×3): 300 ug via ORAL
  Filled 2023-11-02 (×3): qty 3

## 2023-11-02 MED ORDER — INSULIN ASPART 100 UNIT/ML IJ SOLN
0.0000 [IU] | Freq: Three times a day (TID) | INTRAMUSCULAR | Status: DC
  Administered 2023-11-02 (×2): 3 [IU] via SUBCUTANEOUS
  Administered 2023-11-03 (×2): 2 [IU] via SUBCUTANEOUS

## 2023-11-02 MED ORDER — ACETAMINOPHEN 325 MG PO TABS: 650.0000 mg | ORAL_TABLET | Freq: Four times a day (QID) | ORAL | Status: DC | PRN

## 2023-11-02 MED ORDER — GABAPENTIN 300 MG PO CAPS
600.0000 mg | ORAL_CAPSULE | Freq: Three times a day (TID) | ORAL | Status: DC
  Administered 2023-11-02 – 2023-11-04 (×6): 600 mg via ORAL
  Filled 2023-11-02 (×6): qty 2

## 2023-11-02 MED ORDER — MAGNESIUM SULFATE IN D5W 1-5 GM/100ML-% IV SOLN
1.0000 g | Freq: Once | INTRAVENOUS | Status: AC
  Administered 2023-11-02: 1 g via INTRAVENOUS
  Filled 2023-11-02: qty 100

## 2023-11-02 NOTE — Assessment & Plan Note (Addendum)
 Likely mild CHF exacerbation secondary to medication nonadherence for the last 2 months. - Strict I/Os - Dailly weights - Continuous cardiac monitoring - AM BMP, Mg - Restart home dose of 40 mg Lasix  p.o. today  - Continue albuterol  nebs - Goal Mag >2, K >4, replete as indicated - Creatinine elevated above 1.3 (baseline), continue to monitor

## 2023-11-02 NOTE — Assessment & Plan Note (Deleted)
 Mg 1.5 - Repleted with IV Mg 2mg 

## 2023-11-02 NOTE — Assessment & Plan Note (Addendum)
 BNP 31.1. No crackles or edema on exam but given patient has been without medications for 2 months, sx are likely mild CHF exacerbation. - Admit to [facility] Medicine Teaching service, attending Dr. Anders, med-tele - Strict I/Os - Dailly weights - Continuous cardiac monitoring - AM BMP, Mg - S/p 40mg  IV lasix  - Redose lasix  in AM - Continue home metoprolol  (no evidence of low output state), bidil  - Restart Losartan  25mg  - Goal Mag >2, K >4, replete as indicated

## 2023-11-02 NOTE — Assessment & Plan Note (Signed)
 Elevated at 17 this admission secondary to medication noncompliance. - Start home Synthroid  300 mcg - Follow-up outpatient in 4 to 6 weeks

## 2023-11-02 NOTE — Progress Notes (Signed)
     Daily Progress Note Intern Pager: [phone]  Patient name: [name] Medical record number: [id number] Date of birth: 11/26/1970 Age: 53 y.o. Gender: female  Primary Care Provider: Theophilus Pagan, MD Consultants: None Code Status: Full  Pt Overview and Major Events to Date:  08/24: Admitted for heart failure exacerbation  Assessment and Plan:  Samantha CHRISTELLA. [name] is a 53 year old female admitted for heart failure exacerbation.  Pertinent medical history includes CHF, HTN, asthma, hypothyroidism, type 2 diabetes, CKD stage IIIb/IVa, sciatica with left-sided radiculopathy, bipolar depression, and morbid obesity. Assessment & Plan Acute heart failure (HCC) Likely mild CHF exacerbation secondary to medication nonadherence for the last 2 months. - Strict I/Os - Dailly weights - Continuous cardiac monitoring - AM BMP, Mg - Restart home dose of 40 mg Lasix  p.o. today  - Continue albuterol  nebs - Goal Mag >2, K >4, replete as indicated - Creatinine elevated above 1.3 (baseline), continue to monitor Hypothyroidism Elevated at 17 this admission secondary to medication noncompliance. - Start home Synthroid  300 mcg - Follow-up outpatient in 4 to 6 weeks Chronic health problem HTN - continue home bidil , metoprolol , losartan  Asthma - continue home albuterol  T2DM - continue home Lanuts 65u bid, moderate sliding scale, cbg checks with meals and at night Sciatica w/ L-sided radiculopathy - continue home lidocaine  patch, oxycodone  Bipolar depression  - continue home fluoxetine  Anxiety - continue home mirtazapine  Tardive dyskinesia - continue home benztropine   FEN/GI: Heart healthy/carb modified diet PPx: Lovenox  Dispo:Home pending clinical improvement .  Subjective:  Patient was seen and examined at bedside.  She states she feels better than she did on admission.  She does report persistent bilateral lower extremity pain, tightness, and heaviness from fluid overload.  She denies  chest pain, shortness of breath, or any other complaints today.  Objective: Temp:  [98.2 F (36.8 C)-98.9 F (37.2 C)] 98.9 F (37.2 C) (08/25 0043) Pulse Rate:  [96-108] 100 (08/25 0245) Resp:  [10-22] 19 (08/25 0245) BP: (104-159)/(73-121) 104/86 (08/25 0245) SpO2:  [92 %-100 %] 97 % (08/25 0245) Weight:  [148.9 kg] 148.9 kg (08/24 2330)  Physical Exam: General: Alert and oriented, no acute distress, lying comfortably in bed Cardiovascular: RRR, no M/R/G Respiratory: Good air movement to upper lobes, diminished breath sounds to bilateral bases, moderate respiratory effort  Abdomen: Soft, nontender, nondistended, no fluid wave Extremities: Very mild pitting edema to LLE  Laboratory: Most recent CBC Lab Results  Component Value Date   WBC 8.8 11/01/2023   HGB 9.8 (L) 11/01/2023   HCT 31.6 (L) 11/01/2023   MCV 86.8 11/01/2023   PLT 420 (H) 11/01/2023   Most recent BMP    Latest Ref Rng & Units 11/02/2023    5:18 AM  BMP  Glucose 70 - 99 mg/dL 797   BUN 6 - 20 mg/dL 33   Creatinine 9.55 - 1.00 mg/dL 8.12   Sodium 864 - 854 mmol/L 140   Potassium 3.5 - 5.1 mmol/L 4.3   Chloride 98 - 111 mmol/L 111   CO2 22 - 32 mmol/L 21   Calcium  8.9 - 10.3 mg/dL 8.9    Lupie Credit, DO 11/02/2023, 7:13 AM  PGY-1, [facility] FPTS Intern pager: [phone], text pages welcome Secure chat group [facility] Teaching Service

## 2023-11-02 NOTE — ED Notes (Signed)
 Assumed pt care at this time, tidy room, replace EKG leads. Pt denies pain or need for toileting. Verbalized understanding of all precautions. MD at bedside with pharmacy at this time.

## 2023-11-02 NOTE — ED Notes (Signed)
 Pt amb to RR with walker independently

## 2023-11-02 NOTE — Plan of Care (Signed)
 Received after-hours call from patient to our clinic. She is calling her emergency line to ask when she is being moved upstairs and states that she needs something for the pain in her legs.  I advised her that while she is in the hospital she needs to let her nurse or doctor know of any questions and our clinic line will not be able to help her with those matters.  Since I am taking care of her here, I have restarted her home gabapentin  at 600 mg 3 times daily, max 1800 mg/day due to her creatinine clearance.

## 2023-11-02 NOTE — Assessment & Plan Note (Addendum)
 HTN - continue home bidil , metoprolol , losartan  Asthma - continue home albuterol  T2DM - continue home Lanuts 65u bid, moderate sliding scale, cbg checks with meals and at night Sciatica w/ L-sided radiculopathy - continue home lidocaine  patch, oxycodone  Bipolar depression  - continue home fluoxetine  Anxiety - continue home mirtazapine  Tardive dyskinesia - continue home benztropine 

## 2023-11-02 NOTE — Discharge Instructions (Signed)
 Dear [name],  Thank you for letting us  participate in your care. You were hospitalized for difficulty breathing and diagnosed with Acute on chronic congestive heart failure (HCC). This is caused by extra fluid in your lungs. You were treated with medication that helps you urinate the excess fluid.    POST-HOSPITAL & CARE INSTRUCTIONS If you have new shortness of breath, worsening swelling, or gain more than 5lbs in one week or 3lbs in one day please seek medical care. Please make to follow-up with your primary care doctor, see the appointment below. Your medications are listed below. We restarted your Losartan  25mg  daily. Please make sure to discuss this with your physician at hospital follow up.    DOCTOR'S APPOINTMENT   08/29 2:10pm at [facility] clinic for hospital follow up.   Follow-up Information     [facility] Follow up.   Specialty: Rehabilitation Why: They will call you to set up apt times, if you do not hear from them within 3 business days please give them a call. thank you Contact information: [address] [place]  [phone]        [facility] Follow up.   Why: Bariatric rolling walker Contact information: [phone]                Take care and be well!  Family Medicine Teaching Service Inpatient Team [place]  [facility]  [address] [place], [place] [address] [phone]   Resources  Food pantry and assistance -[employer]: [address], [place] [address]. Phone [phone]  -[employer]: [address] [place], [place] [address]. [phone].  -M[employer]: has the purpose of visiting the sick and shut-ins and provide for needs in the surrounding communities. Call [phone]. Email: stpaulbcinc@gmail .com This program provides: Food box for seniors, Financial assistance, Food to meet  basic nutritional needs.  -Meals on Wheels with Senior Resources: [facility] residents age 65 and over who are homebound and unable to obtain and prepare a nutritious meal for themselves are eligible for this service. There may be a waiting list in certain parts of [facility] if the route in that area is full. If you are in [facility] and [place] call [phone] to register. For all other areas call [phone] to register.  -Greater Dietitian: [email or web address].[email or web address]  TRANSPORTATION: -[employer] Department of Health: Call [facility] and [employer] at [phone] for details. AttractionGuides.es  -[employer] [place]: [employer] [place] is the [employer]'s shared-ride transportation service for eligible riders who have a disability that prevents them from riding the fixed route bus. Call [phone]. [employer] [place] riders must pay a fare of $1.50 per trip, or may purchase a 10-ride punch card for $14.00 ($1.40 per ride) or a 40-ride punch card for $48.00 ($1.20 per ride).  -[employer] rideshare transportation service is provided for senior citizens (60+) who live independently within [place] city limits and are unable to drive or have limited access to transportation. Call [phone] to schedule an appointment.  -[place] [employer]: For Medicare or Medicaid recipients call [phone]?[facility], wheelchair fleeta, and ambulatory quotes available.

## 2023-11-02 NOTE — ED Notes (Signed)
 Pt amb to RR with walker

## 2023-11-02 NOTE — ED Notes (Signed)
 Called floor to make aware of PT arrival, pharmacy tech did arrive to reconcile PT medications but PT had just been taken to the floor.PT does have concerns about not getting ger gabapentin  which I told her she could address with pharmacy.

## 2023-11-02 NOTE — Evaluation (Addendum)
 Occupational Therapy Evaluation Patient Details Name: [name] MRN: [id number] DOB: 1970/08/15 Today's Date: 11/02/2023   History of Present Illness   Pt is a 53 y.o. female presenting 8/24 with shortness of breath, cough x4 weeks, BLE pain (L>R). Plan for potential MRI lumbar spine; xray in February 2025 unchanged. Found to have acute heart failure, anemia. PMH significant for asthma, anxiety, MDD with suicide attempt, DMII, HTN, lumbar DJD, sciatica with L radiculopathy.     Clinical Impressions PTA, pt lived at home and her son lived with her but unable to provide much assist at home. Upon eval, pt with BLE pain L>R, reporting that she has had a difficult time getting around her home, managing home tasks, and with limited support from son. Supportive sister in [place] , but did not report whether sister can come stay with her. Pt needing up to mod A for LB ADL this session. Pt currently with functional limitations listed below. Currently recommending HHOT at discharge pending pt progression.      If plan is discharge home, recommend the following:   A little help with walking and/or transfers;A little help with bathing/dressing/bathroom;Assistance with cooking/housework;Assist for transportation;Help with stairs or ramp for entrance     Functional Status Assessment   Patient has had a recent decline in their functional status and demonstrates the ability to make significant improvements in function in a reasonable and predictable amount of time.     Equipment Recommendations   BSC/3in1     Recommendations for Other Services   PT consult     Precautions/Restrictions   Precautions Precautions: Fall     Mobility Bed Mobility Overal bed mobility: Needs Assistance Bed Mobility: Supine to Sit, Sit to Supine     Supine to sit: Supervision Sit to supine: Supervision   General bed mobility comments: to come to and from edge of stretcher bed. Increased time to  bring BLE into bed    Transfers Overall transfer level: Needs assistance Equipment used: Rolling walker (2 wheels) Transfers: Sit to/from Stand Sit to Stand: Contact guard assist           General transfer comment: for safety, cues for hand placement      Balance Overall balance assessment: Needs assistance Sitting-balance support: No upper extremity supported, Feet supported Sitting balance-Leahy Scale: Good     Standing balance support: Bilateral upper extremity supported, During functional activity, Reliant on assistive device for balance                               ADL either performed or assessed with clinical judgement   ADL Overall ADL's : Needs assistance/impaired Eating/Feeding: Independent;Sitting Eating/Feeding Details (indicate cue type and reason): eating on arrival Grooming: Set up;Sitting   Upper Body Bathing: Set up;Sitting   Lower Body Bathing: Moderate assistance;Sit to/from stand   Upper Body Dressing : Set up;Sitting   Lower Body Dressing: Moderate assistance;Sit to/from stand   Toilet Transfer: Contact guard assist;Stand-pivot;Rolling walker (2 wheels)           Functional mobility during ADLs: Contact guard assist;Rolling walker (2 wheels)       Vision Baseline Vision/History: 1 Wears glasses Patient Visual Report: No change from baseline Vision Assessment?: No apparent visual deficits     Perception         Praxis         Pertinent Vitals/Pain Pain Assessment Pain Assessment: 0-10 Pain Score: 10-Worst pain ever  Pain Location: BLE, L>R Pain Descriptors / Indicators: Aching, Burning Pain Intervention(s): Limited activity within patient's tolerance, Monitored during session     Extremity/Trunk Assessment Upper Extremity Assessment Upper Extremity Assessment: Generalized weakness;Right hand dominant   Lower Extremity Assessment Lower Extremity Assessment: Defer to PT evaluation   Cervical / Trunk  Assessment Cervical / Trunk Assessment: Other exceptions (habitus)   Communication Communication Communication: No apparent difficulties   Cognition Arousal: Alert Behavior During Therapy: WFL for tasks assessed/performed Cognition: No apparent impairments             OT - Cognition Comments: follows commands, able to provide thorough history; does repeat self occasionally and makes statements insinuating it has been difficult to keep up with which day it is, but upon testing, is oriented                 Following commands: Intact       Cueing  General Comments   Cueing Techniques: Verbal cues;Gestural cues  HR up to 127 with STS transfer   Exercises Exercises: Other exercises Other Exercises Other Exercises: BLE ankle pump; pt reluctant to perform on LLE   Shoulder Instructions      Home Living Family/patient expects to be discharged to:: Private residence Living Arrangements: Children Available Help at Discharge: Family;Available PRN/intermittently (per pt, son not very helpful) Type of Home: House Home Access: Level entry     Home Layout: Multi-level Alternate Level Stairs-Number of Steps: 7 between Alternate Level Stairs-Rails: Right Bathroom Shower/Tub: Tub/shower unit   Bathroom Toilet: Handicapped height     Home Equipment: Cane - TEFL teacher (4 wheels);Shower seat (although rollator and wheelchair is not hers)          Prior Functioning/Environment Prior Level of Function : Needs assist;History of Falls (last six months)             Mobility Comments: has been using cane in house, rollator or WC when out of house ADLs Comments: 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 cannot stand to cook    OT Problem List: Decreased strength;Decreased activity tolerance;Impaired balance (sitting and/or standing);Decreased knowledge of use of DME or AE;Decreased safety  awareness;Pain   OT Treatment/Interventions: Self-care/ADL training;Therapeutic exercise;DME and/or AE instruction;Therapeutic activities;Patient/family education;Balance training      OT Goals(Current goals can be found in the care plan section)   Acute Rehab OT Goals Patient Stated Goal: get better OT Goal Formulation: With patient Time For Goal Achievement: 11/16/23 Potential to Achieve Goals: Good   OT Frequency:  Min 2X/week    Co-evaluation              AM-PAC OT 6 Clicks Daily Activity     Outcome Measure Help from another person eating meals?: None Help from another person taking care of personal grooming?: A Little Help from another person toileting, which includes using toliet, bedpan, or urinal?: A Little Help from another person bathing (including washing, rinsing, drying)?: A Little Help from another person to put on and taking off regular upper body clothing?: A Little Help from another person to put on and taking off regular lower body clothing?: A Little 6 Click Score: 19   End of Session Equipment Utilized During Treatment: Rolling walker (2 wheels) Nurse Communication: Mobility status  Activity Tolerance: Patient tolerated treatment well;Patient limited by pain Patient left: in bed;with call bell/phone within reach  OT Visit Diagnosis: Unsteadiness on feet (R26.81);Muscle weakness (generalized) (  M62.81);Pain Pain - part of body:  (BLE hips, knees, feet)                Time: 8681-8641 OT Time Calculation (min): 40 min Charges:  OT General Charges $OT Visit: 1 Visit OT Evaluation $OT Eval Low Complexity: 1 Low OT Treatments $Self Care/Home Management : 23-37 mins  Elma JONETTA Lebron FREDERICK, OTR/L [facility] Acute Rehabilitation Office: [phone]   Elma JONETTA Lebron 11/02/2023, 2:53 PM

## 2023-11-02 NOTE — Assessment & Plan Note (Addendum)
 HTN - continue home bidil , metoprolol , losartan  Asthma - continue home albuterol  Hypothyroidism -  holding home levothyroxine  pending TSH T2DM - continue home Lanuts 65u bid, moderate sliding scale, cbg checks with meals and at night Sciatica w/ L-sided radiculopathy - continue home lidocaine  patch, oxycodone , holding gabapentin  given Cr 1.88 Bipolar depression  - continue home fluoxetine  Anxiety - continue home mirtazapine  Tardive dyskinesia - continue home benztropine 

## 2023-11-02 NOTE — ED Notes (Signed)
 IV team at bedside, previous IV leaking did not infiltrate

## 2023-11-02 NOTE — Hospital Course (Addendum)
 [name] is a 53 y.o.female with a history of anxiety, asthma, MDD who was admitted to the [facility] Teaching Service at [facility] for HF exacerbation. Her hospital course is detailed below:  Acute heart failure Patient presented to the ED with CP, SOB, b/l LE edema, and palpitations. BNP WNL at 31.1 and no crackles present on exam however the patient had been without her medications for 2 months at time of admission. She was diuresed with IV and PO Lasix  with relief.  BLE pain/weakness This improved with pain medication. PT/OT worked with patient and recommended ***  Anemia Hemoglobin 9.8 on admission, looks like this has been her baseline in the past. 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 Started PO iron supplement while admitted.   Hypothyroidism TSH elevated at 17.3, has not been taking her home synthroid  in a while. This was restarted during admission.  Other chronic conditions were medically managed with home medications and formulary alternatives as necessary (HTN, asthma, hypothyroidism, T2DM, sciatica w/L sided radiculopathy, bipolar depression, anxiety, tardive dyskinesia)  PCP Follow-up Recommendations: Diet and fluid restriction education Resources to help afford medication Follow up TSH outpatient in 4-6 weeks Follow up anemia, consider iron infusions

## 2023-11-02 NOTE — Assessment & Plan Note (Addendum)
 Normocytic, likely component secondary chronic renal disease. Hgb 9, baseline ~12. - Iron, TIBC, Ferritin

## 2023-11-02 NOTE — Telephone Encounter (Signed)
 Pharmacy Patient Advocate Encounter  Insurance verification completed.    The patient is insured through [employer]/ [employer].     Ran test claim for Fluticasone /Salmeterol Inhaler and the current 30 day co-pay is $263.76.  Ran test claim for Jardiance  and the current 30 day co-pay is $47.00.  Ran test claim for Doreen and the current 30 day co-pay is $47.00.  This test claim was processed through [facility]- copay amounts may vary at other pharmacies due to pharmacy/plan contracts, or as the patient moves through the different stages of their insurance plan.

## 2023-11-02 NOTE — Assessment & Plan Note (Deleted)
 Normocytic, likely component secondary chronic renal disease. Hgb 9, baseline ~12. - AM CBC

## 2023-11-02 NOTE — ED Notes (Signed)
 Patient was given a [place] sandwich bag with cup of ice and diet ginger ale.

## 2023-11-03 DIAGNOSIS — I509 Heart failure, unspecified: Secondary | ICD-10-CM | POA: Diagnosis not present

## 2023-11-03 DIAGNOSIS — M5432 Sciatica, left side: Secondary | ICD-10-CM | POA: Diagnosis not present

## 2023-11-03 DIAGNOSIS — R0602 Shortness of breath: Secondary | ICD-10-CM | POA: Diagnosis not present

## 2023-11-03 DIAGNOSIS — M792 Neuralgia and neuritis, unspecified: Secondary | ICD-10-CM | POA: Insufficient documentation

## 2023-11-03 LAB — BASIC METABOLIC PANEL WITH GFR
Anion gap: 8 (ref 5–15)
BUN: 37 mg/dL — ABNORMAL HIGH (ref 6–20)
CO2: 23 mmol/L (ref 22–32)
Calcium: 8.7 mg/dL — ABNORMAL LOW (ref 8.9–10.3)
Chloride: 106 mmol/L (ref 98–111)
Creatinine, Ser: 2.1 mg/dL — ABNORMAL HIGH (ref 0.44–1.00)
GFR, Estimated: 27 mL/min — ABNORMAL LOW (ref >60)
Glucose, Bld: 150 mg/dL — ABNORMAL HIGH (ref 70–99)
Potassium: 4.4 mmol/L (ref 3.5–5.1)
Sodium: 137 mmol/L (ref 135–145)

## 2023-11-03 LAB — CBC
HCT: 28.2 % — ABNORMAL LOW (ref 36.0–46.0)
Hemoglobin: 8.6 g/dL — ABNORMAL LOW (ref 12.0–15.0)
MCH: 26.2 pg (ref 26.0–34.0)
MCHC: 30.5 g/dL (ref 30.0–36.0)
MCV: 86 fL (ref 80.0–100.0)
Platelets: 382 K/uL (ref 150–400)
RBC: 3.28 MIL/uL — ABNORMAL LOW (ref 3.87–5.11)
RDW: 13.9 % (ref 11.5–15.5)
WBC: 8.6 K/uL (ref 4.0–10.5)
nRBC: 0 % (ref 0.0–0.2)

## 2023-11-03 LAB — GLUCOSE, CAPILLARY
Glucose-Capillary: 128 mg/dL — ABNORMAL HIGH (ref 70–99)
Glucose-Capillary: 104 mg/dL — ABNORMAL HIGH (ref 70–99)
Glucose-Capillary: 129 mg/dL — ABNORMAL HIGH (ref 70–99)
Glucose-Capillary: 148 mg/dL — ABNORMAL HIGH (ref 70–99)

## 2023-11-03 MED ORDER — METHOCARBAMOL 500 MG PO TABS
1000.0000 mg | ORAL_TABLET | Freq: Three times a day (TID) | ORAL | Status: DC
Start: 2023-11-03 — End: 2023-11-04
  Administered 2023-11-03 – 2023-11-04 (×4): 1000 mg via ORAL
  Filled 2023-11-03 (×4): qty 2

## 2023-11-03 MED ORDER — LIDOCAINE 5 % EX PTCH
2.0000 | MEDICATED_PATCH | CUTANEOUS | Status: DC
  Administered 2023-11-03: 2 via TRANSDERMAL
  Filled 2023-11-03: qty 2

## 2023-11-03 MED ORDER — ENOXAPARIN SODIUM 80 MG/0.8ML IJ SOSY
80.0000 mg | PREFILLED_SYRINGE | INTRAMUSCULAR | Status: DC
  Administered 2023-11-04: 80 mg via SUBCUTANEOUS
  Filled 2023-11-03: qty 0.8

## 2023-11-03 NOTE — Assessment & Plan Note (Signed)
 HTN - continue home bidil , metoprolol , losartan  Asthma - continue home albuterol  T2DM - continue home Lanuts 65u bid, moderate sliding scale, cbg checks with meals and at night Sciatica w/ L-sided radiculopathy - continue home lidocaine  patch, oxycodone  Bipolar depression  - continue home fluoxetine  Anxiety - continue home mirtazapine  Tardive dyskinesia - continue home benztropine 

## 2023-11-03 NOTE — TOC Progression Note (Signed)
 Transition of Care [facility]) - Progression Note    Patient Details  Name: [name] MRN: [id number] Date of Birth: 30-Jan-1971  Transition of Care [facility]) CM/SW Contact  Waddell Barnie Rama, RN Phone Number: 11/03/2023, 4:43 PM  Clinical Narrative:    NCM spoke with patient , she would like to go to the [facility] outpatient rehab for outpatient pt and she also would like to have a rolling walker which is rec by PT,  she does not have a preference of the agency.  NCM made referral to Jermaine with [facility] for the walker.    Expected Discharge Plan: OP Rehab Barriers to Discharge: Continued Medical Work up               Expected Discharge Plan and Services In-house Referral: Clinical Social Work     Living arrangements for the past 2 months: Single Family Home                                       Social Drivers of Health (SDOH) Interventions SDOH Screenings   Food Insecurity: Food Insecurity Present (11/03/2023)  Housing: High Risk (11/03/2023)  Transportation Needs: Unmet Transportation Needs (11/03/2023)  Utilities: Not At Risk (11/03/2023)  Alcohol Screen: Low Risk  (02/24/2022)  Depression (PHQ2-9): Low Risk  (08/05/2023)  Financial Resource Strain: High Risk (06/19/2023)  Tobacco Use: Low Risk  (11/01/2023)    Readmission Risk Interventions     No data to display

## 2023-11-03 NOTE — Progress Notes (Signed)
 Provided support as needed.  Rayleen Dade, [place], [facility], Pager [phone]

## 2023-11-03 NOTE — Inpatient Diabetes Management (Signed)
 Inpatient Diabetes Program Recommendations  AACE/ADA: New Consensus Statement on Inpatient Glycemic Control (2015)  Target Ranges:  Prepandial:   less than 140 mg/dL      Peak postprandial:   less than 180 mg/dL (1-2 hours)      Critically ill patients:  140 - 180 mg/dL   Lab Results  Component Value Date   GLUCAP 104 (H) 11/03/2023   HGBA1C 9.7 (H) 11/02/2023    Review of Glycemic Control  Diabetes history: DM2 Outpatient Diabetes medications: Lantus  65 units BID, Novolog  15 units at breakfast and 20 units at lunch, Mounjaro  12.5 mg weekly Current orders for Inpatient glycemic control: Lantus  65 units BID, Novolog  0-15 TID with meals  HgbA1C - 9.7% CBGs 128, 104  Inpatient Diabetes Program Recommendations:    Agree with orders. Eating 100%. Will likely need meal coverage insulin , would start at Novolog  4 units TID with meals  Spoke with pt at bedside regarding her HgbA1C of 9.7% and overall diabetes control. Pt states she lost insurance for approx 2 months and was unable to get her meds at pharmacy. Prior to this, she was taking her insulin  as prescribed and monitoring blood sugars several times/day.  Pt has lost significant amount of weight and was feeling like she was eating healthier. Was off psych and pain meds and this interferred with her taking care of her diabetes. Was able to restart insurance this month and picked up all meds and insulin . xplained how hyperglycemia leads to damage within blood vessels which lead to the common complications seen with uncontrolled diabetes. Stressed to the patient the importance of improving glycemic control to prevent further complications from uncontrolled diabetes. Discussed impact of nutrition, exercise, stress, sickness, and medications on diabetes control.  Discussed carbohydrates, carbohydrate goals per day and meal, along with portion sizes. Pt states she will get back on track with controlling blood sugars when discharged. Will f/u with  PCP regarding her diabetes management.  Thank you. Shona Brandy, RD, LDN, CDCES Inpatient Diabetes Coordinator [phone]

## 2023-11-03 NOTE — Assessment & Plan Note (Addendum)
 Likely mild CHF exacerbation secondary to medication nonadherence for the last 2 months. - Strict I/Os - Dailly weights - Continuous cardiac monitoring - AM CMP - Continue home 40mg  Lasix   - Continue albuterol  nebs q6h PRN - Encourage incentive spirometer - Goal Mag >2, K >4, replete as indicated

## 2023-11-03 NOTE — Progress Notes (Signed)
 FMTS ATTENDING ADMISSION NOTE Burlon Centrella,MD I  have seen and examined this patient, reviewed their chart. I have discussed this patient with the resident.   Patient denies any respiratory concerns today. She is more worried about her left LL pain and the burning sensation in her feet. Upon record review, it appears that this is a chronic problem for which she sees PCP and neurosurgery. Exam: Gen: No distress HEENT: [place]/AT Neuro: She is awake and alert. No facial asymmetry. CN II-XI intact, no sensory loss of both UL and LL. Power 4/5 across all joints. ++ Biceps DTRs Heart: S1 S2 normal, no murmurs. RRR Lungs: CTA B/L Abd: Soft, NT/ND, BS normoactive Ext: No edema. No calf swelling, tenderness, or erythema   A/P: B/L LL pain, worse on the left with sciatica Previous images reviewed Resume home regimen for pain and adjust as needed F/U PT/OT eval's recommendation No bowel or bladder incontinence Consider outpatient PCP pain management and neurosurgery follow-up as planned.   Acute respiratory distress: Currently asymptomatic ?? due to mild systolic CHF exacerbation Symptoms resolved S/P IV Lasix  Transition to home oral Lasix  dose Monitor I/Os closely Monitor O2 requirement closely

## 2023-11-03 NOTE — Plan of Care (Signed)
 FMTS Interim Progress Note  S:Patient was seen and examined at bedside. She states she feels much more comfortable and the pain regimen she is on is helping a lot. She denies cough or SOB after this morning's albuterol  nebulizer treatment. She states taking the pain medicine before PT helped immensely and she was able to ambulate with her walker without difficulty and would like to keep walking the hallway when a nurse is available for supervision.   O: BP 127/69 (BP Location: Right Wrist)   Pulse 93   Temp 98.6 F (37 C) (Oral)   Resp 20   Ht 5' 11 (1.803 m) Comment: recorded 11/01/2023  Wt (!) 157.1 kg   SpO2 99%   BMI 48.30 kg/m    General: A&O, NAD HEENT: No sign of trauma, EOM grossly intact Respiratory: normal WOB, good air movement throughout upper lung fields b/l, slightly diminished breath sounds in b/l lung bases, good respiratory effort, no cough/wheeze/rales/rhonchi  GI: non-distended  Extremities: no peripheral edema Neuro: Normal gait, moves all four extremities appropriately Skin: no lesions/rashes visualized Psych: Appropriate mood and affect  A/P: Wheezing Resolved with albuterol  nebulizer.  - Continue Albuterol  nebulizer PRN q6h - Consider diuresing if hear crackles at lung bases - Continue incentive spirometry  Neuropathic pain Well controlled with new pain regimen implemented this AM. Plan to d/c tomorrow.  - Continue home gabapentin  600mg  TID and monitor Cr clearance closely   - Consider switch to amitriptyline 10mg  up to 25mg  if concern for worsening kidney function - Continue home methocarbamol  1000mg  TID - Continue home percocet q6h PRN - Continue 2 lidocaine  patches  - Take pain medication 30 minutes prior to PT    Lupie Credit, DO 11/03/2023, 2:51 PM PGY-1, [facility] Family Medicine Service pager [phone]

## 2023-11-03 NOTE — Assessment & Plan Note (Addendum)
 History of neuropathic pain in legs, sciatica, lumbar DJD.  - Continue home gabapentin  600 TID - Switch to amytriptyline start at 10 up to 25mg  at bedtime if concern for kidney function - Restart home methocarbamol  1000mg  TIS - Add lido patch for a total of 2 lidocaine  patches  - Pain medication 30 min before PT and encourage movement and stretching for pain and mobility with PT

## 2023-11-03 NOTE — Evaluation (Signed)
 Physical Therapy Evaluation Patient Details Name: [name] MRN: [id number] DOB: 12-10-1970 Today's Date: 11/03/2023  History of Present Illness  Pt is a 53 y.o. female presenting 8/24 with shortness of breath, cough x4 weeks, BLE pain (L>R). Plan for potential MRI lumbar spine; xray in February 2025 unchanged. Found to have acute heart failure, anemia. PMH significant for asthma, anxiety, MDD with suicide attempt, DMII, HTN, lumbar DJD, sciatica with L radiculopathy.   Clinical Impression  Pt in bed upon arrival of PT, agreeable to evaluation at this time. Prior to admission the pt was managing mobility at home with use of quad cane in the home, and rollator for community. She lives in a 3-level home and has to manage 14 stairs to get from bedroom/bathroom to kitchen. The pt describes a long issue of pain from bilateral LE neuropathy and LLE sciatica pain that limits her ability to manage at home and work as well as a recent gap in insurance during which her mobility declined further. The pt was able to manage bed mobility and transfers without physical assistance, but does require BUE support on RW for balance and pain management. She was able to complete 71ft ambulation, but will benefit from further stair training to ensure she is able to safely return home without assistance. The pt will benefit from continued skilled PT and on OP basis after d/c to address LLE sciatica pain. Much of session was also dedicated to discussing use of DME she already has in the home to improve safety and activity tolerance for IADLs (such as bringing rollator inside to allow pt to prepare meals with greater ease). Will continue to follow acutely.         If plan is discharge home, recommend the following: A little help with walking and/or transfers;A little help with bathing/dressing/bathroom;Assistance with cooking/housework;Assist for transportation;Help with stairs or ramp for entrance   Can travel by private  vehicle        Equipment Recommendations Rolling walker (2 wheels) (bariatric)  Recommendations for Other Services       Functional Status Assessment Patient has had a recent decline in their functional status and demonstrates the ability to make significant improvements in function in a reasonable and predictable amount of time.     Precautions / Restrictions Precautions Precautions: Fall Restrictions Weight Bearing Restrictions Per Provider Order: No      Mobility  Bed Mobility Overal bed mobility: Modified Independent Bed Mobility: Supine to Sit, Sit to Supine     Supine to sit: Modified independent (Device/Increase time) Sit to supine: Modified independent (Device/Increase time)   General bed mobility comments: no assist, slightly increased time and effort    Transfers Overall transfer level: Needs assistance Equipment used: Rolling walker (2 wheels) Transfers: Sit to/from Stand Sit to Stand: Contact guard assist           General transfer comment: CGA in session for safety, progressing to supervision    Ambulation/Gait Ambulation/Gait assistance: Contact guard assist Gait Distance (Feet): 75 Feet Assistive device: Rolling walker (2 wheels) Gait Pattern/deviations: Step-through pattern, Decreased stride length, Trunk flexed Gait velocity: decreased Gait velocity interpretation: <1.31 ft/sec, indicative of household ambulator   General Gait Details: trunk flexed with BUE support to manage pain in BLE      Balance Overall balance assessment: Needs assistance Sitting-balance support: No upper extremity supported, Feet supported Sitting balance-Leahy Scale: Good     Standing balance support: Bilateral upper extremity supported, During functional activity, Reliant on assistive  device for balance Standing balance-Leahy Scale: Fair Standing balance comment: dependent on BUE support for pain control                             Pertinent  Vitals/Pain Pain Assessment Pain Assessment: Faces Pain Score: 10-Worst pain ever Faces Pain Scale: Hurts little more Pain Location: BLE, L>R Pain Descriptors / Indicators: Aching, Burning, Sharp, Shooting Pain Intervention(s): Limited activity within patient's tolerance, Monitored during session, Premedicated before session, Repositioned    Home Living Family/patient expects to be discharged to:: Private residence Living Arrangements: Children (son and grandson) Available Help at Discharge: Family;Available PRN/intermittently (per pt, son not very helpful) Type of Home: House Home Access: Level entry     Alternate Level Stairs-Number of Steps: 7 + 7 from Home Layout: Multi-level Home Equipment: Cane - TEFL teacher (4 wheels);Shower seat (although rollator and wheelchair is not hers) Additional Comments: 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 cannot stand to cook     Extremity/Trunk Assessment   Upper Extremity Assessment Upper Extremity Assessment: Defer to OT evaluation    Lower Extremity Assessment Lower Extremity Assessment: RLE deficits/detail;LLE deficits/detail RLE Deficits / Details: limited assessment due to pain, but reports hx of neuropathy pain to knee. poor endurance but no buckling with gait RLE: Unable to fully assess due to pain RLE Sensation: history of peripheral neuropathy RLE Coordination: WNL LLE Deficits / Details: limited assessment due to pain,  but reports hx of neuropathy pain to knee and sciatica pain from hip to foot. poor endurance but no buckling with gait LLE: Unable to fully assess due to pain LLE Sensation: history of peripheral neuropathy LLE Coordination: WNL    Cervical / Trunk Assessment Cervical / Trunk Assessment: Other exceptions Cervical / Trunk Exceptions: large body habitus  Communication   Communication Communication: No apparent difficulties    Cognition Arousal: Alert Behavior During Therapy: WFL for tasks assessed/performed   PT - Cognitive impairments: No apparent impairments                       PT - Cognition Comments: not formally tested but [facility] during session Following commands: Intact       Cueing Cueing Techniques: Verbal cues, Gestural cues     General Comments General comments (skin integrity, edema, etc.): VSS on RA    Exercises     Assessment/Plan    PT Assessment Patient needs continued PT services  PT Problem List Decreased strength;Decreased range of motion;Decreased activity tolerance;Decreased mobility;Decreased balance       PT Treatment Interventions DME instruction;Gait training;Stair training;Therapeutic activities;Functional mobility training;Therapeutic exercise;Patient/family education    PT Goals (Current goals can be found in the Care Plan section)  Acute Rehab PT Goals Patient Stated Goal: return home, manage sciatica pain PT Goal Formulation: With patient Time For Goal Achievement: 11/17/23 Potential to Achieve Goals: Good  Frequency Min 2X/week        AM-PAC PT 6 Clicks Mobility  Outcome Measure Help needed turning from your back to your side while in a flat bed without using bedrails?: None Help needed moving from lying on your back to sitting on the side of a flat bed without using bedrails?: None Help needed moving to and from a bed to a chair (including a wheelchair)?: None Help needed standing up from a chair using your arms  (e.g., wheelchair or bedside chair)?: None Help needed to walk in hospital room?: A Little Help needed climbing 3-5 steps with a railing? : A Lot 6 Click Score: 21    End of Session Equipment Utilized During Treatment: Gait belt Activity Tolerance: Patient tolerated treatment well Patient left: in bed;with call bell/phone within reach Nurse Communication: Mobility status PT Visit Diagnosis: Unsteadiness on feet (R26.81);Pain Pain - Right/Left: Left Pain - part of body: Leg    Time: 8980-8941 PT Time Calculation (min) (ACUTE ONLY): 39 min   Charges:   PT Evaluation $PT Eval Moderate Complexity: 1 Mod PT Treatments $Gait Training: 8-22 mins $Therapeutic Activity: 8-22 mins PT General Charges $$ ACUTE PT VISIT: 1 Visit         Izetta Call, PT, DPT   Acute Rehabilitation Department Office [phone] Secure Chat Communication Preferred  Izetta JULIANNA Call 11/03/2023, 1:15 PM

## 2023-11-03 NOTE — Assessment & Plan Note (Signed)
 Elevated at 17 this admission secondary to medication noncompliance. - Start home Synthroid  300 mcg - Follow-up outpatient in 4 to 6 weeks

## 2023-11-03 NOTE — TOC Initial Note (Signed)
 Transition of Care [facility]) - Initial/Assessment Note    Patient Details  Name: [name] MRN: [id number] Date of Birth: Apr 15, 1970  Transition of Care [facility]) CM/SW Contact:    Luise JAYSON Pan, LCSWA Phone Number: 11/03/2023, 3:44 PM  Clinical Narrative:     Patient from home with son and son provides transportation. Patient states she has the following DME from insurance: cane, shower chair ; patient stated she got walker and wheelchair from a friends. Patient stated she bought a 4 prong cane from [employer]. Patient has a PCP and uses [facility], [facility], or [facility].   Late entry: Food and transportation resources added to patients AVS.   CSW will continue to follow.        Expected Discharge Plan: OP Rehab Barriers to Discharge: Continued Medical Work up   Patient Goals and CMS Choice Patient states their goals for this hospitalization and ongoing recovery are:: To go to outpatient PT          Expected Discharge Plan and Services In-house Referral: Clinical Social Work     Living arrangements for the past 2 months: Single Family Home                                      Prior Living Arrangements/Services Living arrangements for the past 2 months: Single Family Home Lives with:: Adult Children, Self Patient language and need for interpreter reviewed:: Yes Do you feel safe going back to the place where you live?: Yes      Need for Family Participation in Patient Care: No (Comment) Care giver support system in place?: Yes (comment)   Criminal Activity/Legal Involvement Pertinent to Current Situation/Hospitalization: No - Comment as needed  Activities of Daily Living   ADL Screening (condition at time of admission) Independently performs ADLs?: Yes (appropriate for developmental age) Is the patient deaf or have difficulty hearing?: No Does the patient have difficulty seeing, even when wearing glasses/contacts?: No Does the patient have difficulty  concentrating, remembering, or making decisions?: No  Permission Sought/Granted   Permission granted to share information with : No              Emotional Assessment Appearance:: Appears stated age Attitude/Demeanor/Rapport: Engaged Affect (typically observed): Stable Orientation: : Oriented to Self, Oriented to Place, Oriented to  Time, Oriented to Situation Alcohol / Substance Use: Not Applicable Psych Involvement: No (comment)  Admission diagnosis:  Palpitations [R00.2] Acute heart failure (HCC) [I50.9] SOB (shortness of breath) [R06.02] Patient Active Problem List   Diagnosis Date Noted   Neuropathic pain 11/03/2023   Sciatica of left side 11/03/2023   Chronic health problem 11/02/2023   Hypomagnesemia 11/02/2023   SOB (shortness of breath) 11/02/2023   Weakness of both lower limbs 11/02/2023   Acute on chronic congestive heart failure (HCC) 11/01/2023   AKI (acute kidney injury) (HCC) 04/26/2023   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   Chronic low back pain with left-sided sciatica 07/08/2022   Chronic systolic CHF (congestive heart failure) (HCC) - LVEF 35-40% by echo 03-2023    Bilateral knee pain 03/14/2019   HLD (hyperlipidemia) 03/19/2017   Asthma 03/14/2017   Major depressive disorder, recurrent severe without psychotic features (HCC) 05/11/2015   Vitamin D  deficiency 04/11/2015   CKD stage 3b, GFR 30-44 ml/min (HCC) - baseline Scr 1.7-2.1 11/06/2010   Essential hypertension, benign 02/03/2007   Hypothyroidism 05/07/2006   T2DM (type 2 diabetes  mellitus) (HCC) 05/07/2006   Obesity, Class III, BMI 40-49.9 (morbid obesity) 05/07/2006   Iron deficiency anemia 05/07/2006   Obsessive-compulsive disorder 05/07/2006   Generalized anxiety disorder 05/07/2006   PCP:  Theophilus Pagan, MD Pharmacy:   [facility] [address] [place] - [address] [place] [address] Phone: [phone] Fax: [phone]  [id number] - [place], [place] - [address] AT [facility] [address] [place] [address] Phone: [phone] Fax: [phone]  [id number] - [address] ([place]), [place] - [address] [place]) [place] [address] Phone: [phone] Fax: [phone]  [place] [facility] [address] [place] [address] Phone: [phone] Fax: [phone]  [facility] [address] [place] [address] Phone: [phone] Fax: [phone]     Social Drivers of Health (SDOH) Social History: SDOH Screenings   Food Insecurity: Food Insecurity Present (11/03/2023)  Housing: High Risk (11/03/2023)  Transportation Needs: Unmet Transportation Needs (11/03/2023)  Utilities: Not At Risk (11/03/2023)  Alcohol Screen: Low Risk  (02/24/2022)  Depression (PHQ2-9): Low Risk  (08/05/2023)  Financial Resource Strain: High Risk (06/19/2023)  Tobacco Use: Low Risk  (11/01/2023)   SDOH Interventions:     Readmission Risk Interventions     No data to display

## 2023-11-03 NOTE — Plan of Care (Signed)

## 2023-11-03 NOTE — Assessment & Plan Note (Signed)
 Baseline ~1.3; 2.1 today. Worsening kidney function most likely 2/2 diuresis, as expected.  - Continue to monitor especially with gabapentin  and diuresis  - Consider IVF if needed

## 2023-11-03 NOTE — Progress Notes (Signed)
 Daily Progress Note Intern Pager: [phone]  Patient name: [name] Medical record number: [id number] Date of birth: [birth year] Age: 53 y.o. Gender: female  Primary Care Provider: Theophilus Pagan, MD Consultants: None Code Status: Full  Pt Overview and Major Events to Date:  08/24: Admitted for heart failure exacerbation  Assessment and Plan:  [name] is a 53 year old female admitted for heart failure exacerbation.  Pertinent medical history includes CHF, HTN, asthma, hypothyroidism, type 2 diabetes, CKD stage IIIb/IVa, sciatica with left-sided radiculopathy, bipolar depression, and morbid obesity. Plan to d/c tomorrow, 08/27. Assessment & Plan Acute heart failure (HCC) Likely mild CHF exacerbation secondary to medication nonadherence for the last 2 months. - Strict I/Os - Dailly weights - Continuous cardiac monitoring - AM CMP - Continue home 40mg  Lasix   - Continue albuterol  nebs q6h PRN - Encourage incentive spirometer - Goal Mag >2, K >4, replete as indicated Neuropathic pain History of neuropathic pain in legs, sciatica, lumbar DJD.  - Continue home gabapentin  600 TID - Switch to amytriptyline start at 10 up to 25mg  at bedtime if concern for kidney function - Restart home methocarbamol  1000mg  TIS - Add lido patch for a total of 2 lidocaine  patches  - Pain medication 30 min before PT and encourage movement and stretching for pain and mobility with PT AKI (acute kidney injury) (HCC) Baseline ~1.3; 2.1 today. Worsening kidney function most likely 2/2 diuresis, as expected.  - Continue to monitor especially with gabapentin  and diuresis  - Consider IVF if needed Hypothyroidism Elevated at 17 this admission secondary to medication noncompliance. - Start home Synthroid  300 mcg - Follow-up outpatient in 4 to 6 weeks Chronic health problem HTN - continue home bidil , metoprolol , losartan  Asthma - continue home albuterol  T2DM - continue home Lanuts 65u bid,  moderate sliding scale, cbg checks with meals and at night Sciatica w/ L-sided radiculopathy - continue home lidocaine  patch, oxycodone  Bipolar depression  - continue home fluoxetine  Anxiety - continue home mirtazapine  Tardive dyskinesia - continue home benztropine   FEN/GI: heart healthy carb modified PPx: Lovenox   Dispo:Home tomorrow.  Subjective:  Patient was seen and examined at bedside. She states her legs are in so much pain which she describes as shooting pins and needles. She reports a mildly productive cough but denies CP, SOB, palpitations, or any other complaints at this time.   Objective: Temp:  [98.2 F (36.8 C)-98.9 F (37.2 C)] 98.2 F (36.8 C) (08/26 0332) Pulse Rate:  [91-99] 95 (08/26 0332) Resp:  [12-22] 16 (08/25 2200) BP: (91-156)/(50-106) 110/65 (08/26 0332) SpO2:  [96 %-99 %] 98 % (08/26 0332) Weight:  [156.6 kg-157.1 kg] 157.1 kg (08/26 0332)  Physical Exam: General: A&O, uncomfortable appearing Cardiovascular: RRR, no m/r/r Respiratory: tight breath sounds, moderate respiratory effort, single wheeze to upper L lobe cleared after patient coughed on exam with each inhale, no increased WOB, no r/r Abdomen: soft, non-tender, non-distended Extremities: no peripheral edema, 2+ DP pulses, no rash or lesion, no TTP  Laboratory: Most recent CBC Lab Results  Component Value Date   WBC 8.6 11/03/2023   HGB 8.6 (L) 11/03/2023   HCT 28.2 (L) 11/03/2023   MCV 86.0 11/03/2023   PLT 382 11/03/2023   Most recent BMP    Latest Ref Rng & Units 11/03/2023    3:00 AM  BMP  Glucose 70 - 99 mg/dL 849   BUN 6 - 20 mg/dL 37   Creatinine 9.55 - 1.00 mg/dL 7.89   Sodium 864 -  145 mmol/L 137   Potassium 3.5 - 5.1 mmol/L 4.4   Chloride 98 - 111 mmol/L 106   CO2 22 - 32 mmol/L 23   Calcium  8.9 - 10.3 mg/dL 8.7    Samantha Credit, DO 11/03/2023, 6:55 AM  PGY-1, [facility] FPTS Intern pager: [phone], text pages welcome Secure chat group [facility] Teaching Service

## 2023-11-04 ENCOUNTER — Other Ambulatory Visit (HOSPITAL_COMMUNITY): Payer: Self-pay

## 2023-11-04 DIAGNOSIS — R0602 Shortness of breath: Secondary | ICD-10-CM | POA: Diagnosis not present

## 2023-11-04 DIAGNOSIS — E1165 Type 2 diabetes mellitus with hyperglycemia: Secondary | ICD-10-CM | POA: Diagnosis not present

## 2023-11-04 DIAGNOSIS — Z794 Long term (current) use of insulin: Secondary | ICD-10-CM

## 2023-11-04 DIAGNOSIS — M5432 Sciatica, left side: Secondary | ICD-10-CM | POA: Diagnosis not present

## 2023-11-04 LAB — COMPREHENSIVE METABOLIC PANEL WITH GFR
ALT: 12 U/L (ref 0–44)
AST: 20 U/L (ref 15–41)
Albumin: 3.1 g/dL — ABNORMAL LOW (ref 3.5–5.0)
Alkaline Phosphatase: 57 U/L (ref 38–126)
Anion gap: 6 (ref 5–15)
BUN: 43 mg/dL — ABNORMAL HIGH (ref 6–20)
CO2: 23 mmol/L (ref 22–32)
Calcium: 8.9 mg/dL (ref 8.9–10.3)
Chloride: 107 mmol/L (ref 98–111)
Creatinine, Ser: 2.13 mg/dL — ABNORMAL HIGH (ref 0.44–1.00)
GFR, Estimated: 27 mL/min — ABNORMAL LOW (ref >60)
Glucose, Bld: 81 mg/dL (ref 70–99)
Potassium: 4.6 mmol/L (ref 3.5–5.1)
Sodium: 136 mmol/L (ref 135–145)
Total Bilirubin: 0.3 mg/dL (ref 0.0–1.2)
Total Protein: 6.9 g/dL (ref 6.5–8.1)

## 2023-11-04 LAB — CBC
HCT: 30 % — ABNORMAL LOW (ref 36.0–46.0)
Hemoglobin: 9.5 g/dL — ABNORMAL LOW (ref 12.0–15.0)
MCH: 27.1 pg (ref 26.0–34.0)
MCHC: 31.7 g/dL (ref 30.0–36.0)
MCV: 85.5 fL (ref 80.0–100.0)
Platelets: 414 K/uL — ABNORMAL HIGH (ref 150–400)
RBC: 3.51 MIL/uL — ABNORMAL LOW (ref 3.87–5.11)
RDW: 13.9 % (ref 11.5–15.5)
WBC: 8.5 K/uL (ref 4.0–10.5)
nRBC: 0 % (ref 0.0–0.2)

## 2023-11-04 LAB — GLUCOSE, CAPILLARY
Glucose-Capillary: 68 mg/dL — ABNORMAL LOW (ref 70–99)
Glucose-Capillary: 79 mg/dL (ref 70–99)
Glucose-Capillary: 84 mg/dL (ref 70–99)

## 2023-11-04 MED ORDER — LEVOTHYROXINE SODIUM 300 MCG PO TABS
300.0000 ug | ORAL_TABLET | Freq: Every day | ORAL | 0 refills | Status: DC
  Filled 2023-11-04: qty 90, 90d supply, fill #0

## 2023-11-04 MED ORDER — GABAPENTIN 300 MG PO CAPS
600.0000 mg | ORAL_CAPSULE | Freq: Three times a day (TID) | ORAL | 0 refills | Status: AC
  Filled 2023-11-04: qty 180, 30d supply, fill #0

## 2023-11-04 MED ORDER — FERROUS SULFATE 325 (65 FE) MG PO TABS
325.0000 mg | ORAL_TABLET | Freq: Every day | ORAL | 0 refills | Status: DC
  Filled 2023-11-04: qty 30, 30d supply, fill #0

## 2023-11-04 MED ORDER — ALBUTEROL SULFATE HFA 108 (90 BASE) MCG/ACT IN AERS
2.0000 | INHALATION_SPRAY | Freq: Four times a day (QID) | RESPIRATORY_TRACT | 0 refills | Status: DC | PRN
  Filled 2023-11-04: qty 6.7, 25d supply, fill #0

## 2023-11-04 MED ORDER — LOSARTAN POTASSIUM 25 MG PO TABS
25.0000 mg | ORAL_TABLET | Freq: Every day | ORAL | 1 refills | Status: AC
  Filled 2023-11-04: qty 30, 30d supply, fill #0

## 2023-11-04 NOTE — Discharge Summary (Addendum)
 Family Medicine Teaching [facility] Discharge Summary  Patient name: [name] Medical record number: [id number] Date of birth: [birth year] Age: 53 y.o. Gender: female Date of Admission: 11/01/2023  Date of Discharge: 11/04/2023 Admitting Physician: Darren Jernigan, DO  Primary Care Provider: Theophilus Pagan, MD Consultants: none  Indication for Hospitalization: acute heart failure  Brief Hospital Course:  [name] is a 53 y.o.female with a history of anxiety, asthma, MDD who was admitted to the [facility] Teaching Service at [facility] for HF exacerbation. Her hospital course is detailed below:  Acute heart failure Patient presented to the ED with CP, SOB, b/l LE edema, and palpitations. BNP WNL at 31.1 and no crackles present on exam however the patient had been without her medications for 2 months at time of admission. She was diuresed with IV and PO Lasix  with relief. Albuterol  nebulizers were used PRN to help with intermittent cough and wheezing. All symptoms had resolved by time of discharge.  BLE pain/weakness Thought secondary to chronic degenerative changes of spine vs neuropathy. Improved with restarting home gabapentin , although at lower frequency due to kidney function. PT/OT worked with patient and recommended outpatient rehab. DME order for rolling walker placed, she had one at bedside on discharge.   Anemia Hemoglobin 9.8 on admission, looks like this has been her baseline in the past. 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 Started PO iron supplement while admitted.   Hypothyroidism TSH elevated at 17.3, has not been taking her home synthroid  in some time. This was restarted during admission.  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  Discharge Diagnoses/Problem List:  Hospital Problems      Hospital     * (Principal) Acute on chronic congestive heart failure (HCC)     Hypothyroidism     Chronic health problem     Hypomagnesemia     SOB (shortness of breath)     Weakness of both lower limbs     Neuropathic pain     Sciatica of left side    Disposition: Home  Discharge Condition: Stable  Discharge Exam:  General: A&O, NAD, resting comfortably lying supine in bed Cardiovascular: RRR, no m/r/g Respiratory: CTAB, good respiratory effort, slightly diminished breath sounds but otherwise good air movement throughout Abdomen: soft, non-tender, non-distended Extremities: no peripheral edema or TTP  Significant Labs and Imaging:  Recent Labs  Lab 11/03/23 0300 11/04/23 0229  WBC 8.6 8.5  HGB 8.6* 9.5*  HCT 28.2* 30.0*  PLT 382 414*   Recent Labs  Lab 11/03/23 0300 11/04/23 0229  NA 137 136  K 4.4 4.6  CL 106 107  CO2 23 23  GLUCOSE 150* 81  BUN 37* 43*  CREATININE 2.10* 2.13*  CALCIUM  8.7* 8.9  ALKPHOS  --  57  AST  --  20  ALT  --  12  ALBUMIN   --  3.1*   Discharge Medications:  Allergies as of 11/04/2023       Reactions   Ace Inhibitors Other (See Comments)   Patient had acute  renal failure requiring hemodialysis after a suicide attempt.  Nephro recommended avoiding use.   Haldol  [haloperidol  Lactate] Other (See Comments)   Tardive dyskinesia   Nsaids Other (See Comments)   Nephro recommended avoiding after acute renal failure requiring hemodialysis associated with a suicide attempt.   Entresto  [sacubitril-valsartan] Itching   Latex  Other (See Comments)   Rash around IV site        Medication List     PAUSE taking these medications    Ashwagandha 125 MG Caps Wait to take this until your doctor or other care provider tells you to start again. Take 125 mg by mouth at bedtime.   Black Cohosh 450 MG Caps Wait to take this until your doctor or other care provider tells you to start again. Take 450 mg by mouth at bedtime.       TAKE these medications    albuterol  108 (90 Base) MCG/ACT inhaler Commonly known as: VENTOLIN  HFA Inhale 2 puffs into the lungs every 6 (six) hours as needed for shortness of breath.   benztropine  0.5 MG tablet Commonly known as: COGENTIN  Take 1 tablet (0.5 mg total) by mouth 2 (two) times daily.   CBD4 Freeze Pump Vanish Scent 4-1 % Crea Generic drug: Lidocaine -Menthol Apply 1 Application topically daily as needed (pain).   FeroSul 325 (65 Fe) MG tablet Generic drug: ferrous sulfate  Take 1 tablet (325 mg total) by mouth daily with breakfast. Start taking on: November 05, 2023   FLUoxetine  40 MG capsule Commonly known as: PROZAC  Take 1 capsule (40 mg total) by mouth daily.   fluticasone  50 MCG/ACT nasal spray Commonly known as: FLONASE  Place 1 spray into both nostrils daily. 1 spray in each nostril every day   fluticasone -salmeterol 250-50 MCG/ACT Aepb Commonly known as: Wixela Inhub Inhale 1 puff into the lungs in the morning and at bedtime.   furosemide  40 MG tablet Commonly known as: LASIX  Take 1 tablet by mouth once daily   gabapentin  300 MG capsule Commonly known as: NEURONTIN  Take 2 capsules (600 mg total) by mouth 3 (three) times daily. What changed:  how much to take how to take this when to take this additional instructions   hydrOXYzine  50 MG tablet Commonly known as: ATARAX  Take 1 tablet (50 mg total) by mouth 3 (three) times daily as needed for anxiety.   isosorbide -hydrALAZINE  20-37.5 MG tablet Commonly known as: BIDIL  Take 1 tablet by mouth 3  (three) times daily.   Lantus  SoloStar 100 UNIT/ML Solostar Pen Generic drug: insulin  glargine INJECT 65 UNITS SUBCUTANEOUSLY TWICE DAILY What changed: See the new instructions.   levothyroxine  300 MCG tablet Commonly known as: SYNTHROID  Take 1 tablet (300 mcg total) by mouth daily at 6 (six) AM.   lidocaine  4 % Place 1 patch onto the skin daily.   losartan  25 MG tablet Commonly known as: COZAAR  Take 1 tablet (25 mg total) by mouth at bedtime.   methocarbamol  500 MG tablet Commonly known as: ROBAXIN  TAKE 2 TABLETS BY MOUTH THREE TIMES DAILY   metoprolol  succinate 50 MG 24 hr tablet Commonly known as: TOPROL -XL Take 1 tablet (50 mg total) by mouth at bedtime. Take with or immediately following a meal.   mirtazapine  15 MG tablet Commonly known as: REMERON  Take 1 tablet (15 mg total) by mouth at bedtime. Decrease by half in 2 weeks and then discontinue.  Weight gaining medication.   Mounjaro  12.5 MG/0.5ML Pen Generic drug: tirzepatide  Inject 12.5 mg into the skin once a week.  NovoLOG  FlexPen 100 UNIT/ML FlexPen Generic drug: insulin  aspart Inject 15-20 Units into the skin 2 (two) times daily with a meal. Take 15 units at breakfast  and Take 20 units at lunch   oxyCODONE -acetaminophen  5-325 MG tablet Commonly known as: PERCOCET/ROXICET Take 1 tablet by mouth every 6 (six) hours as needed for severe pain (pain score 7-10).   pantoprazole  40 MG tablet Commonly known as: PROTONIX  Take 1 tablet by mouth once daily   QUEtiapine  100 MG tablet Commonly known as: SEROquel  Take 1 tablet (100 mg total) by mouth at bedtime.               Durable Medical Equipment  (From admission, onward)           Start     Ordered   11/03/23 1644  For home use only DME Walker rolling  Once       Comments: Bariatric  Question Answer Comment  Walker: With 5 Inch Wheels   Patient needs a walker to treat with the following condition Weakness      11/03/23 1643             Discharge Instructions: Please refer to Patient Instructions section of EMR for full details.  Patient was counseled important signs and symptoms that should prompt return to medical care, changes in medications, dietary instructions, activity restrictions, and follow up appointments.   Follow-Up Appointments:  Follow-up Information     [facility] Follow up.   Specialty: Rehabilitation Why: They will call you to set up apt times, if you do not hear from them within 3 business days please give them a call. thank you Contact information: [address] [place]  [phone]        [facility] Follow up.   Why: Bariatric rolling walker Contact information: [phone]                Lupie Credit, DO 11/04/2023, 12:30 PM PGY-1, [facility]  Upper Level Addendum: I have seen and evaluated this patient along with Dr. Lupie and reviewed the above note, making necessary revisions as appropriate. I agree with the medical decision making and physical exam as noted above. Elyce Prescott, DO PGY-2 [facility] Family Medicine Residency

## 2023-11-04 NOTE — TOC Transition Note (Signed)
 Transition of Care [facility]) - Discharge Note   Patient Details  Name: DEMANI WEYRAUCH MRN: [id number] Date of Birth: 11-13-1970  [facility]) CM/SW Contact:  Waddell Barnie Rama, RN Phone Number: 11/04/2023, 12:07 PM   Clinical Narrative:    For dc today, she is set up with outpatient pt, and she has rolling walker at the bedside.      Barriers to Discharge: Continued Medical Work up   Patient Goals and CMS Choice Patient states their goals for this hospitalization and ongoing recovery are:: To go to outpatient PT          Discharge Placement                       Discharge Plan and Services Additional resources added to the After Visit Summary for   In-house Referral: Clinical Social Work                                   Social Drivers of Health (SDOH) Interventions SDOH Screenings   Food Insecurity: Food Insecurity Present (11/03/2023)  Housing: High Risk (11/03/2023)  Transportation Needs: Unmet Transportation Needs (11/03/2023)  Utilities: Not At Risk (11/03/2023)  Alcohol Screen: Low Risk  (02/24/2022)  Depression (PHQ2-9): Low Risk  (08/05/2023)  Financial Resource Strain: High Risk (06/19/2023)  Tobacco Use: Low Risk  (11/01/2023)     Readmission Risk Interventions     No data to display

## 2023-11-04 NOTE — Assessment & Plan Note (Signed)
 HTN - continue home bidil , metoprolol , losartan  Asthma - continue home albuterol  T2DM - continue home Lanuts 65u bid, moderate sliding scale, cbg checks with meals and at night Sciatica w/ L-sided radiculopathy - continue home lidocaine  patch, oxycodone  Bipolar depression  - continue home fluoxetine  Anxiety - continue home mirtazapine  Tardive dyskinesia - continue home benztropine 

## 2023-11-04 NOTE — Progress Notes (Signed)
 AVS printed, PIV and tele removed by RN.    Pt expressed verbal understanding of discharge POC.   RW at bedside. Cane and other personal belongings at bedside.   Currently eating lunch.  TOC meds needs.  Discharge lounge notified.

## 2023-11-04 NOTE — Plan of Care (Signed)

## 2023-11-04 NOTE — Progress Notes (Signed)
 D/C lounge notified of TOC meds and transport needed for lounge.

## 2023-11-04 NOTE — Assessment & Plan Note (Addendum)
 History of neuropathic pain in legs, sciatica, lumbar DJD.  - Continue home gabapentin  600 TID - Switch to amytriptyline start at 10 up to 25mg  at bedtime if concern for kidney function - Restart home methocarbamol  1000mg  TIS - Add lido patch for a total of 2 lidocaine  patches  - Pain medication 30 min before PT and encourage movement and stretching for pain and mobility with PT - PCP to refer back to neurosurgery/back specialist and nephrology

## 2023-11-04 NOTE — Assessment & Plan Note (Signed)
 Likely mild CHF exacerbation secondary to medication nonadherence for the last 2 months. - Strict I/Os - Dailly weights - Continuous cardiac monitoring - AM CMP - Continue home 40mg  Lasix   - Continue albuterol  nebs q6h PRN - Encourage incentive spirometer - Goal Mag >2, K >4, replete as indicated

## 2023-11-04 NOTE — Progress Notes (Signed)
 Daily Progress Note Intern Pager: [phone]  Patient name: [name] Medical record number: [id number] Date of birth: [birth year] Age: 53 y.o. Gender: female  Primary Care Provider: Theophilus Pagan, MD Consultants: None Code Status: Full  Pt Overview and Major Events to Date:  08/24: Admitted for heart failure exacerbation  Assessment and Plan:  [name] is a 53 year old female admitted for heart failure exacerbation.  Pertinent medical history includes CHF, HTN, asthma, hypothyroidism, type 2 diabetes, CKD stage IIIb/IVa, sciatica with left-sided radiculopathy, bipolar depression, and morbid obesity. Plan for d/c today, PT recs for outpatient rehab.  Assessment & Plan Acute on chronic congestive heart failure (HCC) Likely mild CHF exacerbation secondary to medication nonadherence for the last 2 months. - Strict I/Os - Dailly weights - Continuous cardiac monitoring - AM CMP - Continue home 40mg  Lasix   - Continue albuterol  nebs q6h PRN - Encourage incentive spirometer - Goal Mag >2, K >4, replete as indicated Neuropathic pain History of neuropathic pain in legs, sciatica, lumbar DJD.  - Continue home gabapentin  600 TID - Switch to amytriptyline start at 10 up to 25mg  at bedtime if concern for kidney function - Restart home methocarbamol  1000mg  TIS - Add lido patch for a total of 2 lidocaine  patches  - Pain medication 30 min before PT and encourage movement and stretching for pain and mobility with PT - PCP to refer back to neurosurgery/back specialist and nephrology  Hypothyroidism Elevated at 17 this admission secondary to medication noncompliance. - Start home Synthroid  300 mcg - Follow-up outpatient in 4 to 6 weeks Chronic health problem HTN - continue home bidil , metoprolol , losartan  Asthma - continue home albuterol  T2DM - continue home Lanuts 65u bid, moderate sliding scale, cbg checks with meals and at night Sciatica w/ L-sided radiculopathy -  continue home lidocaine  patch, oxycodone  Bipolar depression  - continue home fluoxetine  Anxiety - continue home mirtazapine  Tardive dyskinesia - continue home benztropine   FEN/GI: regular diet PPx: Lovenox  Dispo:Home today.   Subjective:  Patient was seen and examined at bedside.  She has some bilateral neuropathic leg pain today, however she was recently administered her morning meds and is waiting for them to kick in.  She has no other complaints today, states her breathing is doing very well on room air.  Objective: Temp:  [98.2 F (36.8 C)-98.8 F (37.1 C)] 98.3 F (36.8 C) (08/27 0353) Pulse Rate:  [79-98] 86 (08/27 0353) Resp:  [13-20] 15 (08/26 1619) BP: (112-146)/(69-102) 136/102 (08/26 2342) SpO2:  [98 %-100 %] 100 % (08/27 0353) Physical Exam: General: A&O, NAD, resting comfortably lying supine in bed Cardiovascular: RRR, no m/r/g Respiratory: CTAB, good respiratory effort, slightly diminished breath sounds but otherwise good air movement throughout Abdomen: soft, non-tender, non-distended Extremities: no peripheral edema or TTP  Laboratory: Most recent CBC Lab Results  Component Value Date   WBC 8.5 11/04/2023   HGB 9.5 (L) 11/04/2023   HCT 30.0 (L) 11/04/2023   MCV 85.5 11/04/2023   PLT 414 (H) 11/04/2023   Most recent BMP    Latest Ref Rng & Units 11/04/2023    2:29 AM  BMP  Glucose 70 - 99 mg/dL 81   BUN 6 - 20 mg/dL 43   Creatinine 9.55 - 1.00 mg/dL 7.86   Sodium 864 - 854 mmol/L 136   Potassium 3.5 - 5.1 mmol/L 4.6   Chloride 98 - 111 mmol/L 107   CO2 22 - 32 mmol/L 23   Calcium  8.9 - 10.3  mg/dL 8.9    Samantha Credit, DO 11/04/2023, 7:20 AM  PGY-1, [facility] Family Medicine FPTS Intern pager: [phone], text pages welcome Secure chat group [facility] Teaching Service

## 2023-11-04 NOTE — Assessment & Plan Note (Deleted)
 Baseline ~1.3; 2.1 today. Worsening kidney function most likely 2/2 diuresis, as expected.  - Continue to monitor especially with gabapentin  and diuresis  - Consider IVF if needed

## 2023-11-04 NOTE — Assessment & Plan Note (Signed)
 Elevated at 17 this admission secondary to medication noncompliance. - Start home Synthroid  300 mcg - Follow-up outpatient in 4 to 6 weeks

## 2023-11-05 NOTE — Progress Notes (Signed)
 Virtual Visit via Telephone Note  I connected with [name] on 11/11/23 at  2:10 PM EDT by telephone and verified that I am speaking with the correct person using two identifiers.  Location: Patient: [name] - Home Provider: Izetta Nap, DO - [facility]   I discussed the limitations, risks, security and privacy concerns of performing an evaluation and management service by telephone and the availability of in person appointments. I also discussed with the patient that there may be a patient responsible charge related to this service. The patient expressed understanding and agreed to proceed.  History of Present Illness: Hospital follow-up Hospitalized at [facility] from 11/01/2018 25-8/27 for acute heart failure exacerbation in the setting of being unable to obtain her medications.  She received diuresis with Lasix  with relief in symptoms.  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  Reports her health insurance stopped her on 08/28/2023 when she was released from work after being on disability for the past year.  Reports she then started cutting her medications in half to try to get by but eventually ran out of medication.  Reports she was calling the state frequently trying to get coverage.  Finally received approval on 8/12 and was able to pick up her medications last week.  States she did feel a little bit better after hospital stay but now she is feeling short of breath.  Feels like things are getting worse and feels like something bad is going to happen.   Reports she discussed her affairs with her son in case this happened.  States she has been sleeping on 3 pillows feels like she is swelling in her legs.  Reports has been difficult for her to speak at times.  Tearful at times and unsure if she will get better.   Observations/Objective: Alert, intermittently tearful and in distress during phone call Hoarse sounding voice, able to speak in full sentences but occasional deep inhalation  Assessment and Plan: Dyspnea Worsening orthopnea and peripheral edema in addition to dyspnea per patient report, recommend urgent ED evaluation given recent admission for acute heart failure exacerbation.  Patient initially resistant, discussed I would call 911 if she was unwilling to go to the ED and she then stated her son would take her.  Recommend she take her medication as prescribed from hospital discharge and will need PCP follow-up after better control of acute symptoms.   Izetta Nap, MD

## 2023-11-06 ENCOUNTER — Telehealth (INDEPENDENT_AMBULATORY_CARE_PROVIDER_SITE_OTHER): Payer: Self-pay | Admitting: Family Medicine

## 2023-11-06 DIAGNOSIS — R0601 Orthopnea: Secondary | ICD-10-CM | POA: Diagnosis not present

## 2023-11-16 NOTE — Progress Notes (Signed)
 I provided 20 minutes of non-face-to-face time during this encounter.   Izetta Nap, MD

## 2023-11-17 NOTE — Therapy (Unsigned)
 OUTPATIENT PHYSICAL THERAPY THORACOLUMBAR EVALUATION   Patient Name: [name] MRN: [id number] DOB:1970-04-25, 53 y.o., female Today's Date: 11/17/2023  END OF SESSION:   Past Medical History:  Diagnosis Date   Acute renal failure (HCC) 05/27/2010   hemodialysis for 6 weeks   Anxiety    Asthma    Depression    Diabetes mellitus    Hypertension    Rhabdomyolysis 06/06/2010   after drug overdose   Suicide attempt by drug ingestion (HCC) 06/05/2010   result rhabdomyolosis and ARF requrining dialysis    SVT (supraventricular tachycardia) (HCC) 05/28/2021   Past Surgical History:  Procedure Laterality Date   ANKLE ARTHROSCOPY Right 08/17/2015   Procedure: RIGHT ANKLE ARTHROSCOPY WITH SYNOVECTOMY AND LOOSE BODY EXCISION;  Surgeon: Maude Herald, MD;  Location: [facility] OR;  Service: Orthopedics;  Laterality: Right;   CHOLECYSTECTOMY     TUBAL LIGATION  1998   Patient Active Problem List   Diagnosis Date Noted   Neuropathic pain 11/03/2023   Sciatica of left side 11/03/2023   Chronic health problem 11/02/2023   Hypomagnesemia 11/02/2023   SOB (shortness of breath) 11/02/2023   Weakness of both lower limbs 11/02/2023   Acute on chronic congestive heart failure (HCC) 11/01/2023   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   Chronic low back pain with left-sided sciatica 07/08/2022   Chronic systolic CHF (congestive heart failure) (HCC) - LVEF 35-40% by echo 03-2023    Bilateral knee pain 03/14/2019   HLD (hyperlipidemia) 03/19/2017   Asthma 03/14/2017   Major depressive disorder, recurrent severe without psychotic features (HCC) 05/11/2015   Vitamin D  deficiency 04/11/2015   CKD stage 3b, GFR 30-44 ml/min (HCC) - baseline Scr 1.7-2.1 11/06/2010   Essential hypertension, benign 02/03/2007   Hypothyroidism 05/07/2006   T2DM (type 2 diabetes mellitus) (HCC) 05/07/2006   Obesity, Class III, BMI 40-49.9 (morbid obesity) 05/07/2006   Iron deficiency anemia 05/07/2006   Obsessive-compulsive  disorder 05/07/2006   Generalized anxiety disorder 05/07/2006    PCP: Theophilus Pagan, MD   REFERRING PROVIDER: Anders Otto DASEN, MD  REFERRING DIAG: Unsteadiness on feet (R26.81);Pain  Rationale for Evaluation and Treatment: Rehabilitation  THERAPY DIAG:  No diagnosis found.  ONSET DATE: 16 months  SUBJECTIVE:                                                                                                                                                                                           SUBJECTIVE STATEMENT: 53 y.o. female presenting for an initial neurosurgical evaluation, at the kind request of Waylon Rockey SAUNDERS, MD secondary to Chronic LBP. Onset of symptoms began 16 months ago in March 2024  with a cramp in right buttock. This worsened to the point she stopped working in April 2024. 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 Since that time she has seen Dr Gillie as well as both [facility]. She says Dr Gillie discussed surgery if she lost weight, but then he changed his mind. She did PT, had NCS showing Neuropathy, had injections. Despite this her pain continues. She established with [facility] last week, started Oxycodone  5mg  2-6 times each day. She is typically taking BID. She describes pain in left low back, buttock, hamstring, both calf and shin. This is sharp, like water running down lateral calf. She has paraesthesias to both feet, feel either hot or cold. No radicular pain in right leg. Pain is worse with standing, only tolerates activity for a brief period of time and then must sit. She has begun using walker or wheelchair due to this. Her mobility is limited due to back pain and left leg weakness. She fell 1 month ago due to this. She has difficulty making it to bathroom on time due to limited mobility with pain in low back bothering her the most.   PERTINENT HISTORY:  53 y.o. female with severe low back pain and left leg pain, worse with standing,  walking, activity. We reviewed her imaging noting a widely patent central canal with degenerative changes of facet joints, large effusions/facet hypertrophy. She also has significantly atrophied musculature of lumbar sacral region. We had a long discussion regarding arthritis and weakened muscles and their role in lon back pain. We discussed the role of conservative cares in treating back pain. This includes activity modification, physical therapy, medications including NSAIDs for flares, and injections. We discussed the relapsing nature of back pain. We discussed a spine-healthy lifestyle including a healthy diet, weight control, staying active, and maintaining core strength, stability, and lower extremity flexibility. We discussed that there is generally not a good surgical treatment for axial back pain unless it is associated with a mechanical/structural abnormality, radicular leg pain, weakness, or other neurologic signs/symptoms. We discussed physical therapy to work on balance as well as core strengthening for low back pain. I am providing exercises today.  I also mentioned role of facet injections, peripheral nerve stimulator and multifidus stimulator and how each may benefit her. She will follow up with Dr Waylon to discuss these options in greater detail.  As she is not a surgical candidate, she does not need a follow up appointment.   PAIN:  Are you having pain? {OPRCPAIN:27236}  PRECAUTIONS: None  RED FLAGS: None   WEIGHT BEARING RESTRICTIONS: No  FALLS:  Has patient fallen in last 6 months? Yes. Number of falls 1   OCCUPATION: not working  PLOF: Independent  PATIENT GOALS: ***  NEXT MD VISIT: ***  OBJECTIVE:  Note: Objective measures were completed at Evaluation unless otherwise noted.  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   PATIENT SURVEYS:  Modified Oswestry:  MODIFIED OSWESTRY DISABILITY SCALE  Date: *** Score  Pain intensity {ODI 1:32962}  2. Personal care (washing, dressing, etc.) {ODI 2:32963}  3. Lifting {ODI 3:32964}  4. Walking {ODI 4:32965}  5. Sitting {ODI 5:32966}  6. Standing {ODI 6:32967}  7. Sleeping {ODI 7:32968}  8. Social Life {ODI 8:32969}  9. Traveling {ODI 9:32970}  10. Employment/ Homemaking {ODI [id number]  Total ***/50  Interpretation of scores: Score Category Description  0-20% Minimal Disability The patient can cope with most living activities. Usually no treatment is indicated apart from advice on lifting, sitting and exercise  21-40% Moderate Disability The patient experiences more pain and difficulty with sitting, lifting and standing. Travel and social life are more difficult and they may be disabled from work. Personal care, sexual activity and sleeping are not grossly affected, and the patient can usually be managed by conservative means  41-60% Severe Disability Pain remains the main problem in this group, but activities of daily living are affected. These patients require a detailed investigation  61-80% Crippled Back pain impinges on all aspects of the patient's life. Positive intervention is required  81-100% Bed-bound  These patients are either bed-bound or exaggerating their symptoms  Bluford FORBES Zoe DELENA Karon DELENA, et al. Surgery versus conservative management of stable thoracolumbar fracture: the PRESTO feasibility RCT. [place] ([place]): [employer]; 2021 Nov. [facility] Technology Assessment, No. 25.62.) Appendix 3, Oswestry Disability Index category descriptors. Available from: FindJewel[employer]cz  Minimally Clinically Important Difference (MCID) = 12.8%  MUSCLE LENGTH: Hamstrings: Right *** deg; Left *** deg Debby test: Right *** deg; Left *** deg  POSTURE: {posture:25561}  PALPATION: deferred  LUMBAR  ROM:   AROM eval  Flexion   Extension   Right lateral flexion   Left lateral flexion   Right rotation   Left rotation    (Blank rows = not tested)  LOWER EXTREMITY ROM:     {AROM/PROM:27142}  Right eval Left eval  Hip flexion    Hip extension    Hip abduction    Hip adduction    Hip internal rotation    Hip external rotation    Knee flexion    Knee extension    Ankle dorsiflexion    Ankle plantarflexion    Ankle inversion    Ankle eversion     (Blank rows = not tested)  LOWER EXTREMITY MMT:    MMT Right eval Left eval  Hip flexion    Hip extension    Hip abduction    Hip adduction    Hip internal rotation    Hip external rotation    Knee flexion    Knee extension    Ankle dorsiflexion    Ankle plantarflexion    Ankle inversion    Ankle eversion     (Blank rows = not tested)  LUMBAR SPECIAL TESTS:  Straight leg raise test: {pos/neg:25243} and Slump test: {pos/neg:25243}  FUNCTIONAL TESTS:  30 seconds chair stand test  GAIT: Distance walked: *** Assistive device utilized: {Assistive devices:23999} Level of assistance: {Levels of assistance:24026} Comments: ***  TREATMENT DATE: ***                                                                                                                                 PATIENT EDUCATION:  Education details: Discussed eval findings, rehab rationale and POC and  patient is in agreement  Person educated: Patient Education method: Chief Technology Officer Education comprehension: verbalized understanding and needs further education  HOME EXERCISE PROGRAM: ***  ASSESSMENT:  CLINICAL IMPRESSION: Patient is a *** y.o. *** who was seen today for physical therapy evaluation and treatment for ***.   OBJECTIVE IMPAIRMENTS: {opptimpairments:25111}.   ACTIVITY LIMITATIONS: {activitylimitations:27494}  PARTICIPATION LIMITATIONS: {participationrestrictions:25113}  PERSONAL FACTORS: {Personal factors:25162} are  also affecting patient's functional outcome.   REHAB POTENTIAL: Fair based on previous unsuccessful OPPT episodes, intractable pain and lack of definitive causation of symptoms  CLINICAL DECISION MAKING: Evolving/moderate complexity  EVALUATION COMPLEXITY: Moderate   GOALS: Goals reviewed with patient? No  SHORT TERM GOALS: Target date: ***  *** Baseline: Goal status: INITIAL  2.  *** Baseline:  Goal status: INITIAL  3.  *** Baseline:  Goal status: INITIAL  4.  *** Baseline:  Goal status: INITIAL  5.  *** Baseline:  Goal status: INITIAL  6.  *** Baseline:  Goal status: INITIAL  LONG TERM GOALS: Target date: ***  *** Baseline:  Goal status: INITIAL  2.  *** Baseline:  Goal status: INITIAL  3.  *** Baseline:  Goal status: INITIAL  4.  *** Baseline:  Goal status: INITIAL  5.  *** Baseline:  Goal status: INITIAL  6.  *** Baseline:  Goal status: INITIAL  PLAN:  PT FREQUENCY: {rehab frequency:25116}  PT DURATION: {rehab duration:25117}  PLANNED INTERVENTIONS: 97110-Therapeutic exercises, 97530- Therapeutic activity, [id number]- Neuromuscular re-education, 97535- Self Care, [id number]- Manual therapy, [phone]- Gait training, Patient/Family education, Balance training, and Stair training.  PLAN FOR NEXT SESSION: HEP review and update, manual techniques as appropriate, aerobic tasks, ROM and flexibility activities, strengthening and PREs, TPDN, gait and balance training as needed     Elin Seats M Kinleigh Nault, PT 11/17/2023, 3:00 PM

## 2023-11-18 ENCOUNTER — Ambulatory Visit: Attending: Family Medicine

## 2023-11-18 ENCOUNTER — Other Ambulatory Visit: Payer: Self-pay

## 2023-11-18 DIAGNOSIS — M6281 Muscle weakness (generalized): Secondary | ICD-10-CM | POA: Insufficient documentation

## 2023-11-18 DIAGNOSIS — M5459 Other low back pain: Secondary | ICD-10-CM | POA: Insufficient documentation

## 2023-11-18 DIAGNOSIS — M5442 Lumbago with sciatica, left side: Secondary | ICD-10-CM | POA: Insufficient documentation

## 2023-11-18 DIAGNOSIS — R2689 Other abnormalities of gait and mobility: Secondary | ICD-10-CM | POA: Insufficient documentation

## 2023-11-24 ENCOUNTER — Encounter: Payer: Self-pay | Admitting: Physical Therapy

## 2023-11-24 ENCOUNTER — Ambulatory Visit: Admitting: Physical Therapy

## 2023-11-24 DIAGNOSIS — M5459 Other low back pain: Secondary | ICD-10-CM | POA: Diagnosis not present

## 2023-11-24 DIAGNOSIS — M6281 Muscle weakness (generalized): Secondary | ICD-10-CM

## 2023-11-24 DIAGNOSIS — M5442 Lumbago with sciatica, left side: Secondary | ICD-10-CM

## 2023-11-24 NOTE — Therapy (Signed)
 OUTPATIENT PHYSICAL THERAPY THORACOLUMBAR EVALUATION   Patient Name: [name] MRN: [id number] DOB:[birth year], 53 y.o., female Today's Date: 11/24/2023  END OF SESSION:  PT End of Session - 11/24/23 1600     Visit Number 2    Number of Visits 8    Date for PT Re-Evaluation 01/18/24    Authorization Type [employer]    PT Start Time 1530    PT Stop Time 1610    PT Time Calculation (min) 40 min    Activity Tolerance Patient limited by fatigue    Behavior During Therapy [facility] for tasks assessed/performed           Past Medical History:  Diagnosis Date   Acute renal failure (HCC) 05/27/2010   hemodialysis for 6 weeks   Anxiety    Asthma    Depression    Diabetes mellitus    Hypertension    Rhabdomyolysis 06/06/2010   after drug overdose   Suicide attempt by drug ingestion (HCC) 06/05/2010   result rhabdomyolosis and ARF requrining dialysis    SVT (supraventricular tachycardia) (HCC) 05/28/2021   Past Surgical History:  Procedure Laterality Date   ANKLE ARTHROSCOPY Right 08/17/2015   Procedure: RIGHT ANKLE ARTHROSCOPY WITH SYNOVECTOMY AND LOOSE BODY EXCISION;  Surgeon: Maude Herald, MD;  Location: [facility] OR;  Service: Orthopedics;  Laterality: Right;   CHOLECYSTECTOMY     TUBAL LIGATION  1998   Patient Active Problem List   Diagnosis Date Noted   Neuropathic pain 11/03/2023   Sciatica of left side 11/03/2023   Chronic health problem 11/02/2023   Hypomagnesemia 11/02/2023   SOB (shortness of breath) 11/02/2023   Weakness of both lower limbs 11/02/2023   Acute on chronic congestive heart failure (HCC) 11/01/2023   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   Chronic low back pain with left-sided sciatica 07/08/2022   Chronic systolic CHF (congestive heart failure) (HCC) - LVEF 35-40% by echo 03-2023    Bilateral knee pain 03/14/2019   HLD (hyperlipidemia) 03/19/2017   Asthma 03/14/2017   Major depressive disorder, recurrent severe without psychotic features (HCC) 05/11/2015   Vitamin  D deficiency 04/11/2015   CKD stage 3b, GFR 30-44 ml/min (HCC) - baseline Scr 1.7-2.1 11/06/2010   Essential hypertension, benign 02/03/2007   Hypothyroidism 05/07/2006   T2DM (type 2 diabetes mellitus) (HCC) 05/07/2006   Obesity, Class III, BMI 40-49.9 (morbid obesity) 05/07/2006   Iron deficiency anemia 05/07/2006   Obsessive-compulsive disorder 05/07/2006   Generalized anxiety disorder 05/07/2006    PCP: Theophilus Pagan, MD   REFERRING PROVIDER: Anders Otto DASEN, MD  REFERRING DIAG: Unsteadiness on feet (R26.81);Pain  Rationale for Evaluation and Treatment: Rehabilitation  THERAPY DIAG:  Other low back pain  Muscle weakness (generalized)  Low back pain with left-sided sciatica, unspecified back pain laterality, unspecified chronicity  ONSET DATE: 16 months  SUBJECTIVE:  SUBJECTIVE STATEMENT: Pt attended today's session with reports of 8/10 pain. Pt stated that they have maintained good compliance with current HEP.  R sided radicular symptoms started after last session.    53 y.o. female presenting for an initial neurosurgical evaluation, at the kind request of Waylon Rockey SAUNDERS, MD secondary to Chronic LBP. Onset of symptoms began 16 months ago in March 2024 with a cramp in right buttock. This worsened to the point she stopped working in April 2024. 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 Since that time she has seen Dr Gillie as well as both [facility]. She says Dr Gillie discussed surgery if she lost weight, but then he changed his mind. She did PT, had NCS showing Neuropathy, had injections. Despite this her pain continues. She established with [facility] last week, started Oxycodone  5mg  2-6 times each day. She is typically taking BID. She describes pain in left low  back, buttock, hamstring, both calf and shin. This is sharp, like water running down lateral calf. She has paraesthesias to both feet, feel either hot or cold. No radicular pain in right leg. Pain is worse with standing, only tolerates activity for a brief period of time and then must sit. She has begun using walker or wheelchair due to this. Her mobility is limited due to back pain and left leg weakness. She fell 1 month ago due to this. She has difficulty making it to bathroom on time due to limited mobility with pain in low back bothering her the most.   PERTINENT HISTORY:  53 y.o. female with severe low back pain and left leg pain, worse with standing, walking, activity. We reviewed her imaging noting a widely patent central canal with degenerative changes of facet joints, large effusions/facet hypertrophy. She also has significantly atrophied musculature of lumbar sacral region. We had a long discussion regarding arthritis and weakened muscles and their role in lon back pain. We discussed the role of conservative cares in treating back pain. This includes activity modification, physical therapy, medications including NSAIDs for flares, and injections. We discussed the relapsing nature of back pain. We discussed a spine-healthy lifestyle including a healthy diet, weight control, staying active, and maintaining core strength, stability, and lower extremity flexibility. We discussed that there is generally not a good surgical treatment for axial back pain unless it is associated with a mechanical/structural abnormality, radicular leg pain, weakness, or other neurologic signs/symptoms. We discussed physical therapy to work on balance as well as core strengthening for low back pain. I am providing exercises today.  I also mentioned role of facet injections, peripheral nerve stimulator and multifidus stimulator and how each may benefit her. She will follow up with Dr Waylon to discuss these options in  greater detail.  As she is not a surgical candidate, she does not need a follow up appointment.   PAIN:  Are you having pain? Yes: NPRS scale: 10/10 Pain location: low back, LLE Pain description: ache, radiating Aggravating factors: activity Relieving factors: medication  PRECAUTIONS: None  RED FLAGS: None   WEIGHT BEARING RESTRICTIONS: No  FALLS:  Has patient fallen in last 6 months? Yes. Number of falls 1   OCCUPATION: not working  PLOF: Independent  PATIENT GOALS: To walk independently again  NEXT MD VISIT: TBD  OBJECTIVE:  Note: Objective measures were completed at Evaluation unless otherwise noted.  DIAGNOSTIC FINDINGS:  Multilevel degenerate age, greatest at at L4-L5 where there are  bilateral facet joint effusions  with perifacet inflammatory  change/arthropathy and mild bilateral foraminal stenosis.   PATIENT SURVEYS:  Patient-specific activity scoring scheme (Point to one number):  0 represents "unable to perform." 10 represents "able to perform at prior level. 0 1 2 3 4 5 6 7 8 9  10 (Date and Score) Activity Initial  Activity Eval     Standing >30s   2    Walking 5 min  0    Standing from sitting 5     Total score = sum of the activity scores/number of activities Minimum detectable change (90%CI) for average score = 2 points Minimum detectable change (90%CI) for single activity score = 3 points PSFS developed by: Rosalee MYRTIS Marvis KYM Charlet CHRISTELLA., & Binkley, J. (1995). Assessing disability and change on individual  patients: a report of a patient specific measure. Physiotherapy [place], 47, [phone]. Reproduced with the permission of the authors  Score: 7/30 23% perceived function   MUSCLE LENGTH: Hamstrings: Right 90 deg; Left 90 deg Thomas test: TBD  POSTURE: UTA to excessive WS to L  PALPATION: deferred  LUMBAR ROM:   AROM eval  Flexion NT  Extension NT  Right lateral flexion NT  Left lateral flexion NT  Right rotation 75%   Left rotation 75%   (Blank rows = not tested)  LOWER EXTREMITY ROM:     Active  Right eval Left eval  Hip flexion    Hip extension    Hip abduction    Hip adduction    Hip internal rotation 0d 0d  Hip external rotation    Knee flexion    Knee extension    Ankle dorsiflexion    Ankle plantarflexion    Ankle inversion    Ankle eversion     (Blank rows = not tested)  LOWER EXTREMITY MMT:    MMT Right eval Left eval  Hip flexion    Hip extension    Hip abduction    Hip adduction    Hip internal rotation    Hip external rotation    Knee flexion    Knee extension    Ankle dorsiflexion    Ankle plantarflexion    Ankle inversion    Ankle eversion     (Blank rows = not tested)  LUMBAR SPECIAL TESTS:  Straight leg raise test: Negative and Slump test: Positive  FUNCTIONAL TESTS:  30 seconds chair stand test 0 reps  GAIT: Distance walked: 5ftx2 Assistive device utilized: Quad cane small base Level of assistance: Complete Independence Comments: slow cadence , WS L  TREATMENT:       [facility] Adult PT Treatment:                                                DATE: 11/24/2023  Therapeutic Exercise: Seated hamstring stretch 2x1' B Pball rollout Ts 2x12 Supine piriformis push/pull 2x1'B Therapeutic Activity: NuStep 8' for activity tolerance Supine marching 2x10B  [facility] Adult PT Treatment:                                                DATE: 11/18/23 Eval and HEP Self Care:  Additional minutes spent for educating on updated Therapeutic Home Exercise Program as well as comparing current status to condition at start of symptoms. This included exercises focusing on stretching, strengthening, with focus on eccentric aspects. Long term goals include an improvement in range of motion, strength, endurance as well as avoiding reinjury. Patient's  frequency would include in 1-2 times a day, 3-5 times a week for a duration of 6-12 weeks. Proper technique shown and discussed handout in great detail. All questions were discussed and addressed.        PATIENT EDUCATION:  Education details: Discussed eval findings, rehab rationale and POC and patient is in agreement  Person educated: Patient Education method: Explanation and Handouts Education comprehension: verbalized understanding and needs further education  HOME EXERCISE PROGRAM: Access Code: [email or web address] URL: [email or web address] Date: 11/18/2023 Prepared by: Reyes Kohut  Exercises - Static Prone on Elbows  - 2 x daily - 5 x weekly - 1 sets - 1 reps - 2 min hold - Supine 90/90 Abdominal Bracing  - 2 x daily - 5 x weekly - 1 sets - 2 reps - 30s hold - Sit to Stand with Armchair  - 2 x daily - 5 x weekly - 1 sets - 5 reps  ASSESSMENT:  CLINICAL IMPRESSION: Pt attended physical therapy session for continuation of treatment regarding LBP. Pt attended with onset of R radicular symptoms which have reports of being worse the L side today. Today's treatment focused on improvement of  lumbar stability, posterior chain motility proximal hip strength, and activity tolerance.  Pt showed good tolerance to administered treatment with no adverse effects by the end of session. Skilled intervention was utilized via activity modification for pt tolerance with task completion, functional progression/regression promoting best outcomes inline with current rehab goals, as well as minimal verbal/tactile cuing alongside no physical assistance for safe and appropriate performance of today's activities. Continue with therapeutic focus on current POC outline.   Patient is a 53 y.o. female who was seen today for physical therapy evaluation and treatment for chronic low back and L radicular symptoms. Patient unable to stand safely to assess trunk ROM as standing tolerance is less than 30s.  Marked  mobility restrictions in B hip IR but good hamstring flexibility observed.  Patient understands rationale for rehab and is agreeable to POC   OBJECTIVE IMPAIRMENTS: Abnormal gait, decreased activity tolerance, decreased balance, decreased endurance, decreased knowledge of condition, decreased mobility, difficulty walking, decreased strength, improper body mechanics, postural dysfunction, obesity, and pain.   ACTIVITY LIMITATIONS: carrying, lifting, bending, sitting, standing, squatting, and stairs  PERSONAL FACTORS: Behavior pattern, Fitness, Past/current experiences, and Time since onset of injury/illness/exacerbation are also affecting patient's functional outcome.   REHAB POTENTIAL: Fair based on previous unsuccessful OPPT episodes, intractable pain and lack of definitive causation of symptoms  CLINICAL DECISION MAKING: Evolving/moderate complexity  EVALUATION COMPLEXITY: Moderate   GOALS: Goals reviewed with patient? No  SHORT TERM GOALS: Target date: 12/02/2023    Patient to demonstrate independence in HEP  Baseline: [id number] Goal status: INITIAL  2.  Able to stand from sitting with single UE support. Baseline: 0 reps Goal status: INITIAL  LONG TERM GOALS: Target date: 12/16/2023  Patient will acknowledge 6/10 pain at least once during episode of care   Baseline: 10/10 Goal status: INITIAL  2.  Patient will score at least 13/30 on PSFS to signify clinically meaningful improvement in functional abilities.   Baseline: 7/30 Goal status: INITIAL  3.  Patient will increase 30s chair stand reps from 0 to 2 with/without arms to demonstrate and improved functional ability with less pain/difficulty as well as reduce fall risk.  Baseline: 0 Goal status: INITIAL  4.  Patient to ambulate 258ft across level ground with LRAD Baseline: 53ft with SBQC Goal status: INITIAL    PLAN:  PT FREQUENCY: 2x/week  PT DURATION: 4 weeks  PLANNED INTERVENTIONS: 97110-Therapeutic  exercises, 97530- Therapeutic activity, [id number]- Neuromuscular re-education, 97535- Self Care, [id number]- Manual therapy, [phone]- Gait training, Patient/Family education, Balance training, and Stair training.  PLAN FOR NEXT SESSION: HEP review and update, manual techniques as appropriate, aerobic tasks, ROM and flexibility activities, strengthening and PREs, TPDN, gait and balance training as needed   Date of referral: 10/30/23 Referring provider: Anders Otto DASEN, MD Referring diagnosis? Unsteadiness on feet (R26.81);Pain Treatment diagnosis? (if different than referring diagnosis) M54.42,G89.29 (ICD-10-CM) - Chronic bilateral low back pain with left-sided sciatica  What was this (referring dx) caused by? Ongoing Issue  Lysle of Condition: Chronic (continuous duration > 3 months)   Laterality: Lt  Current Functional Measure Score: Other PSFS 7/30  Objective measurements identify impairments when they are compared to normal values, the uninvolved extremity, and prior level of function.  [x]  Yes  []  No  Objective assessment of functional ability: Moderate functional limitations   Briefly describe symptoms: 53 y.o. female with severe low back pain and left leg pain, worse with standing, walking, activity  How did symptoms start: chronic  Average pain intensity:  Last 24 hours: 8  Past week: 8  How often does the pt experience symptoms? Constantly  How much have the symptoms interfered with usual daily activities? Quite a bit  How has condition changed since care began at this facility? NA - initial visit  In general, how is the patients overall health? Good   BACK PAIN (STarT Back Screening Tool) Has pain spread down the leg(s) at some time in the last 2 weeks? y Has there been pain in the shoulder or neck at some time in the last 2 weeks? n Has the pt only walked short distances because of back pain? y Has patient dressed more slowly because of back pain in the past 2 weeks?  y Does patient think it's not safe for a person with this condition to be physically active? y Does patient have worrying thoughts a lot of the time? y Does patient feel back pain is terrible and will never get any better? y Has patient stopped enjoying things they usually enjoy? y     Mabel Kiang, PT, DPT 11/24/2023, 4:10 PM

## 2023-11-26 ENCOUNTER — Ambulatory Visit: Admitting: Physical Therapy

## 2023-11-27 ENCOUNTER — Other Ambulatory Visit: Payer: Self-pay | Admitting: Family Medicine

## 2023-11-27 DIAGNOSIS — I5042 Chronic combined systolic (congestive) and diastolic (congestive) heart failure: Secondary | ICD-10-CM

## 2023-11-27 DIAGNOSIS — G8929 Other chronic pain: Secondary | ICD-10-CM

## 2023-11-27 DIAGNOSIS — E118 Type 2 diabetes mellitus with unspecified complications: Secondary | ICD-10-CM

## 2023-12-01 ENCOUNTER — Ambulatory Visit: Admitting: Physical Therapy

## 2023-12-03 ENCOUNTER — Encounter: Payer: Self-pay | Admitting: Physical Therapy

## 2023-12-03 ENCOUNTER — Ambulatory Visit

## 2023-12-03 ENCOUNTER — Ambulatory Visit: Admitting: Physical Therapy

## 2023-12-03 DIAGNOSIS — M6281 Muscle weakness (generalized): Secondary | ICD-10-CM

## 2023-12-03 DIAGNOSIS — M5459 Other low back pain: Secondary | ICD-10-CM | POA: Diagnosis not present

## 2023-12-03 NOTE — Therapy (Signed)
 OUTPATIENT PHYSICAL THERAPY THORACOLUMBAR EVALUATION   Patient Name: [name] MRN: [id number] DOB:08/22/1970, 53 y.o., female Today's Date: 12/03/2023  END OF SESSION:  PT End of Session - 12/03/23 1735     Visit Number 3    Number of Visits 8    Date for Recertification  01/18/24    Authorization Type [employer]    PT Start Time 1735    PT Stop Time 1813    PT Time Calculation (min) 38 min    Activity Tolerance Patient limited by fatigue    Behavior During Therapy [facility] for tasks assessed/performed           Past Medical History:  Diagnosis Date   Acute renal failure 05/27/2010   hemodialysis for 6 weeks   Anxiety    Asthma    Depression    Diabetes mellitus    Hypertension    Rhabdomyolysis 06/06/2010   after drug overdose   Suicide attempt by drug ingestion (HCC) 06/05/2010   result rhabdomyolosis and ARF requrining dialysis    SVT (supraventricular tachycardia) 05/28/2021   Past Surgical History:  Procedure Laterality Date   ANKLE ARTHROSCOPY Right 08/17/2015   Procedure: RIGHT ANKLE ARTHROSCOPY WITH SYNOVECTOMY AND LOOSE BODY EXCISION;  Surgeon: Maude Herald, MD;  Location: [facility] OR;  Service: Orthopedics;  Laterality: Right;   CHOLECYSTECTOMY     TUBAL LIGATION  1998   Patient Active Problem List   Diagnosis Date Noted   Neuropathic pain 11/03/2023   Sciatica of left side 11/03/2023   Chronic health problem 11/02/2023   Hypomagnesemia 11/02/2023   SOB (shortness of breath) 11/02/2023   Weakness of both lower limbs 11/02/2023   Acute on chronic congestive heart failure (HCC) 11/01/2023   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   Chronic low back pain with left-sided sciatica 07/08/2022   Chronic systolic CHF (congestive heart failure) (HCC) - LVEF 35-40% by echo 03-2023    Bilateral knee pain 03/14/2019   HLD (hyperlipidemia) 03/19/2017   Asthma 03/14/2017   Major depressive disorder, recurrent severe without psychotic features (HCC) 05/11/2015   Vitamin D   deficiency 04/11/2015   CKD stage 3b, GFR 30-44 ml/min (HCC) - baseline Scr 1.7-2.1 11/06/2010   Essential hypertension, benign 02/03/2007   Hypothyroidism 05/07/2006   T2DM (type 2 diabetes mellitus) (HCC) 05/07/2006   Obesity, Class III, BMI 40-49.9 (morbid obesity) 05/07/2006   Iron deficiency anemia 05/07/2006   Obsessive-compulsive disorder 05/07/2006   Generalized anxiety disorder 05/07/2006    PCP: Theophilus Pagan, MD   REFERRING PROVIDER: Anders Otto DASEN, MD  REFERRING DIAG: Unsteadiness on feet (R26.81);Pain  Rationale for Evaluation and Treatment: Rehabilitation  THERAPY DIAG:  Other low back pain  Muscle weakness (generalized)  ONSET DATE: 16 months  SUBJECTIVE:  SUBJECTIVE STATEMENT: Pt attended today's session with reports of 8/10 pain. Pt stated that they have maintained good compliance with current HEP.  Felt sore after last session, however pain is unchanged today.   53 y.o. female presenting for an initial neurosurgical evaluation, at the kind request of Waylon Rockey SAUNDERS, MD secondary to Chronic LBP. Onset of symptoms began 16 months ago in March 2024 with a cramp in right buttock. This worsened to the point she stopped working in April 2024. 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 Since that time she has seen Dr Gillie as well as both [facility]. She says Dr Gillie discussed surgery if she lost weight, but then he changed his mind. She did PT, had NCS showing Neuropathy, had injections. Despite this her pain continues. She established with [facility] last week, started Oxycodone  5mg  2-6 times each day. She is typically taking BID. She describes pain in left low back, buttock, hamstring, both calf and shin. This is sharp, like water running down lateral  calf. She has paraesthesias to both feet, feel either hot or cold. No radicular pain in right leg. Pain is worse with standing, only tolerates activity for a brief period of time and then must sit. She has begun using walker or wheelchair due to this. Her mobility is limited due to back pain and left leg weakness. She fell 1 month ago due to this. She has difficulty making it to bathroom on time due to limited mobility with pain in low back bothering her the most.   PERTINENT HISTORY:  52 y.o. female with severe low back pain and left leg pain, worse with standing, walking, activity. We reviewed her imaging noting a widely patent central canal with degenerative changes of facet joints, large effusions/facet hypertrophy. She also has significantly atrophied musculature of lumbar sacral region. We had a long discussion regarding arthritis and weakened muscles and their role in lon back pain. We discussed the role of conservative cares in treating back pain. This includes activity modification, physical therapy, medications including NSAIDs for flares, and injections. We discussed the relapsing nature of back pain. We discussed a spine-healthy lifestyle including a healthy diet, weight control, staying active, and maintaining core strength, stability, and lower extremity flexibility. We discussed that there is generally not a good surgical treatment for axial back pain unless it is associated with a mechanical/structural abnormality, radicular leg pain, weakness, or other neurologic signs/symptoms. We discussed physical therapy to work on balance as well as core strengthening for low back pain. I am providing exercises today.  I also mentioned role of facet injections, peripheral nerve stimulator and multifidus stimulator and how each may benefit her. She will follow up with Dr Waylon to discuss these options in greater detail.  As she is not a surgical candidate, she does not need a follow up appointment.    PAIN:  Are you having pain? Yes: NPRS scale: 10/10 Pain location: low back, LLE Pain description: ache, radiating Aggravating factors: activity Relieving factors: medication  PRECAUTIONS: None  RED FLAGS: None   WEIGHT BEARING RESTRICTIONS: No  FALLS:  Has patient fallen in last 6 months? Yes. Number of falls 1   OCCUPATION: not working  PLOF: Independent  PATIENT GOALS: To walk independently again  NEXT MD VISIT: TBD  OBJECTIVE:  Note: Objective measures were completed at Evaluation unless otherwise noted.  DIAGNOSTIC FINDINGS:  Multilevel degenerate age, greatest at at L4-L5 where there are  bilateral facet joint  effusions with perifacet inflammatory  change/arthropathy and mild bilateral foraminal stenosis.   PATIENT SURVEYS:  Patient-specific activity scoring scheme (Point to one number):  0 represents "unable to perform." 10 represents "able to perform at prior level. 0 1 2 3 4 5 6 7 8 9  10 (Date and Score) Activity Initial  Activity Eval     Standing >30s   2    Walking 5 min  0    Standing from sitting 5     Total score = sum of the activity scores/number of activities Minimum detectable change (90%CI) for average score = 2 points Minimum detectable change (90%CI) for single activity score = 3 points PSFS developed by: Rosalee MYRTIS Marvis KYM Charlet CHRISTELLA., & Binkley, J. (1995). Assessing disability and change on individual  patients: a report of a patient specific measure. Physiotherapy [place], 47, [phone]. Reproduced with the permission of the authors  Score: 7/30 23% perceived function   MUSCLE LENGTH: Hamstrings: Right 90 deg; Left 90 deg Thomas test: TBD  POSTURE: UTA to excessive WS to L  PALPATION: deferred  LUMBAR ROM:   AROM eval  Flexion NT  Extension NT  Right lateral flexion NT  Left lateral flexion NT  Right rotation 75%  Left rotation 75%   (Blank rows = not tested)  LOWER EXTREMITY ROM:     Active   Right eval Left eval  Hip flexion    Hip extension    Hip abduction    Hip adduction    Hip internal rotation 0d 0d  Hip external rotation    Knee flexion    Knee extension    Ankle dorsiflexion    Ankle plantarflexion    Ankle inversion    Ankle eversion     (Blank rows = not tested)  LOWER EXTREMITY MMT:    MMT Right eval Left eval  Hip flexion    Hip extension    Hip abduction    Hip adduction    Hip internal rotation    Hip external rotation    Knee flexion    Knee extension    Ankle dorsiflexion    Ankle plantarflexion    Ankle inversion    Ankle eversion     (Blank rows = not tested)  LUMBAR SPECIAL TESTS:  Straight leg raise test: Negative and Slump test: Positive  FUNCTIONAL TESTS:  30 seconds chair stand test 0 reps  GAIT: Distance walked: 76ftx2 Assistive device utilized: Quad cane small base Level of assistance: Complete Independence Comments: slow cadence , WS L  TREATMENT:       [facility] Adult PT Treatment:                                                DATE: 12/03/2023  Therapeutic Exercise: Nustep 6' Supine QL stretch 2x30s Neuromuscular re-ed: Seated pball press 2x15, hold 4s Supine LTR 2x12 Supine marching 2x12B, hold 1s Cue for core bracing STS  2x8  Pt requires additional time for exercise      [facility] Adult PT Treatment:                                                DATE: 11/24/2023 Therapeutic Exercise: Seated hamstring stretch  2x1' B Pball rollout Ts 2x12 Supine piriformis push/pull 2x1'B Therapeutic Activity: NuStep 8' for activity tolerance Supine marching 2x10B                                                                                                                           [facility] Adult PT Treatment:                                                DATE: 11/18/23 Eval and HEP Self Care:  Additional minutes spent for educating on updated Therapeutic Home Exercise Program as well as comparing current status  to condition at start of symptoms. This included exercises focusing on stretching, strengthening, with focus on eccentric aspects. Long term goals include an improvement in range of motion, strength, endurance as well as avoiding reinjury. Patient's frequency would include in 1-2 times a day, 3-5 times a week for a duration of 6-12 weeks. Proper technique shown and discussed handout in great detail. All questions were discussed and addressed.        PATIENT EDUCATION:  Education details: Discussed eval findings, rehab rationale and POC and patient is in agreement  Person educated: Patient Education method: Explanation and Handouts Education comprehension: verbalized understanding and needs further education  HOME EXERCISE PROGRAM: Access Code: [email or web address] URL: [email or web address] Date: 11/18/2023 Prepared by: Reyes Kohut  Exercises - Static Prone on Elbows  - 2 x daily - 5 x weekly - 1 sets - 1 reps - 2 min hold - Supine 90/90 Abdominal Bracing  - 2 x daily - 5 x weekly - 1 sets - 2 reps - 30s hold - Sit to Stand with Armchair  - 2 x daily - 5 x weekly - 1 sets - 5 reps  ASSESSMENT:  CLINICAL IMPRESSION: Pt attended physical therapy session for continuation of treatment regarding LBP. Today's treatment focused on improvement of  lumbar stability, posterior chain motility proximal hip strength, and activity tolerance. Pt continues to report plataue with back pain and general function. Pt showed good tolerance to administered treatment with no adverse effects by the end of session. Skilled intervention was utilized via activity modification for pt tolerance with task completion, functional progression/regression promoting best outcomes inline with current rehab goals, as well as minimal verbal/tactile cuing alongside no physical assistance for safe and appropriate performance of today's activities. Continue with therapeutic focus on current POC outline.   Patient is a 53 y.o.  female who was seen today for physical therapy evaluation and treatment for chronic low back and L radicular symptoms. Patient unable to stand safely to assess trunk ROM as standing tolerance is less than 30s.  Marked mobility restrictions in B hip IR but good hamstring flexibility observed.  Patient understands rationale for rehab and is agreeable to POC   OBJECTIVE IMPAIRMENTS: Abnormal gait, decreased activity tolerance,  decreased balance, decreased endurance, decreased knowledge of condition, decreased mobility, difficulty walking, decreased strength, improper body mechanics, postural dysfunction, obesity, and pain.   ACTIVITY LIMITATIONS: carrying, lifting, bending, sitting, standing, squatting, and stairs  PERSONAL FACTORS: Behavior pattern, Fitness, Past/current experiences, and Time since onset of injury/illness/exacerbation are also affecting patient's functional outcome.   REHAB POTENTIAL: Fair based on previous unsuccessful OPPT episodes, intractable pain and lack of definitive causation of symptoms  CLINICAL DECISION MAKING: Evolving/moderate complexity  EVALUATION COMPLEXITY: Moderate   GOALS: Goals reviewed with patient? No  SHORT TERM GOALS: Target date: 12/02/2023    Patient to demonstrate independence in HEP  Baseline: [id number] Goal status: INITIAL  2.  Able to stand from sitting with single UE support. Baseline: 0 reps Goal status: INITIAL   LONG TERM GOALS: Target date: 12/16/2023  Patient will acknowledge 6/10 pain at least once during episode of care   Baseline: 10/10 Goal status: INITIAL  2.  Patient will score at least 13/30 on PSFS to signify clinically meaningful improvement in functional abilities.   Baseline: 7/30 Goal status: INITIAL  3.  Patient will increase 30s chair stand reps from 0 to 2 with/without arms to demonstrate and improved functional ability with less pain/difficulty as well as reduce fall risk.  Baseline: 0 Goal status: INITIAL  4.   Patient to ambulate 231ft across level ground with LRAD Baseline: 65ft with SBQC Goal status: INITIAL    PLAN:  PT FREQUENCY: 2x/week  PT DURATION: 4 weeks  PLANNED INTERVENTIONS: 97110-Therapeutic exercises, 97530- Therapeutic activity, [id number]- Neuromuscular re-education, 97535- Self Care, [id number]- Manual therapy, [phone]- Gait training, Patient/Family education, Balance training, and Stair training.  PLAN FOR NEXT SESSION: HEP review and update, manual techniques as appropriate, aerobic tasks, ROM and flexibility activities, strengthening and PREs, TPDN, gait and balance training as needed   Date of referral: 10/30/23 Referring provider: Anders Otto DASEN, MD Referring diagnosis? Unsteadiness on feet (R26.81);Pain Treatment diagnosis? (if different than referring diagnosis) M54.42,G89.29 (ICD-10-CM) - Chronic bilateral low back pain with left-sided sciatica  What was this (referring dx) caused by? Ongoing Issue  Lysle of Condition: Chronic (continuous duration > 3 months)   Laterality: Lt  Current Functional Measure Score: Other PSFS 7/30  Objective measurements identify impairments when they are compared to normal values, the uninvolved extremity, and prior level of function.  [x]  Yes  []  No  Objective assessment of functional ability: Moderate functional limitations   Briefly describe symptoms: 53 y.o. female with severe low back pain and left leg pain, worse with standing, walking, activity  How did symptoms start: chronic  Average pain intensity:  Last 24 hours: 8  Past week: 8  How often does the pt experience symptoms? Constantly  How much have the symptoms interfered with usual daily activities? Quite a bit  How has condition changed since care began at this facility? NA - initial visit  In general, how is the patients overall health? Good   BACK PAIN (STarT Back Screening Tool) Has pain spread down the leg(s) at some time in the last 2 weeks? y Has there  been pain in the shoulder or neck at some time in the last 2 weeks? n Has the pt only walked short distances because of back pain? y Has patient dressed more slowly because of back pain in the past 2 weeks? y Does patient think it's not safe for a person with this condition to be physically active? y Does patient have worrying thoughts a lot of the time?  y Does patient feel back pain is terrible and will never get any better? y Has patient stopped enjoying things they usually enjoy? y     Mabel Kiang, PT, DPT 12/03/2023, 6:14 PM

## 2023-12-04 ENCOUNTER — Ambulatory Visit (INDEPENDENT_AMBULATORY_CARE_PROVIDER_SITE_OTHER): Admitting: Family Medicine

## 2023-12-04 VITALS — BP 143/78 | HR 88 | Wt 340.2 lb

## 2023-12-04 DIAGNOSIS — I5042 Chronic combined systolic (congestive) and diastolic (congestive) heart failure: Secondary | ICD-10-CM

## 2023-12-04 DIAGNOSIS — N184 Chronic kidney disease, stage 4 (severe): Secondary | ICD-10-CM

## 2023-12-04 DIAGNOSIS — Z794 Long term (current) use of insulin: Secondary | ICD-10-CM

## 2023-12-04 DIAGNOSIS — Z1231 Encounter for screening mammogram for malignant neoplasm of breast: Secondary | ICD-10-CM

## 2023-12-04 DIAGNOSIS — M25561 Pain in right knee: Secondary | ICD-10-CM

## 2023-12-04 DIAGNOSIS — F332 Major depressive disorder, recurrent severe without psychotic features: Secondary | ICD-10-CM | POA: Diagnosis not present

## 2023-12-04 DIAGNOSIS — E039 Hypothyroidism, unspecified: Secondary | ICD-10-CM

## 2023-12-04 DIAGNOSIS — G8929 Other chronic pain: Secondary | ICD-10-CM | POA: Diagnosis not present

## 2023-12-04 DIAGNOSIS — D509 Iron deficiency anemia, unspecified: Secondary | ICD-10-CM | POA: Diagnosis not present

## 2023-12-04 DIAGNOSIS — M25562 Pain in left knee: Secondary | ICD-10-CM | POA: Diagnosis not present

## 2023-12-04 DIAGNOSIS — E1122 Type 2 diabetes mellitus with diabetic chronic kidney disease: Secondary | ICD-10-CM

## 2023-12-04 DIAGNOSIS — K219 Gastro-esophageal reflux disease without esophagitis: Secondary | ICD-10-CM

## 2023-12-04 DIAGNOSIS — Z23 Encounter for immunization: Secondary | ICD-10-CM | POA: Diagnosis not present

## 2023-12-04 MED ORDER — MOUNJARO 15 MG/0.5ML ~~LOC~~ SOAJ: 15.0000 mg | SUBCUTANEOUS | 3 refills | Status: AC

## 2023-12-04 MED ORDER — FERROUS SULFATE 325 (65 FE) MG PO TABS: 325.0000 mg | ORAL_TABLET | Freq: Every day | ORAL | 2 refills | Status: DC

## 2023-12-04 MED ORDER — PANTOPRAZOLE SODIUM 40 MG PO TBEC: 40.0000 mg | DELAYED_RELEASE_TABLET | Freq: Every day | ORAL | 1 refills | Status: AC

## 2023-12-04 MED ORDER — ISOSORB DINITRATE-HYDRALAZINE 20-37.5 MG PO TABS: 1.0000 | ORAL_TABLET | Freq: Three times a day (TID) | ORAL | 2 refills | Status: AC

## 2023-12-04 MED ORDER — FUROSEMIDE 40 MG PO TABS: 40.0000 mg | ORAL_TABLET | Freq: Every day | ORAL | 1 refills | Status: AC

## 2023-12-04 MED ORDER — METHYLPREDNISOLONE ACETATE 40 MG/ML IJ SUSP
40.0000 mg | Freq: Once | INTRAMUSCULAR | Status: AC
  Administered 2023-12-04: 40 mg via INTRAMUSCULAR

## 2023-12-04 MED ORDER — METOPROLOL SUCCINATE ER 50 MG PO TB24: 50.0000 mg | ORAL_TABLET | Freq: Every day | ORAL | 3 refills | Status: AC

## 2023-12-04 MED ORDER — ROSUVASTATIN CALCIUM 20 MG PO TABS: 20.0000 mg | ORAL_TABLET | Freq: Every day | ORAL | 3 refills | Status: AC

## 2023-12-04 NOTE — Assessment & Plan Note (Signed)
 PHQ-9: 23, positive for SI.  Discussed, denies any active plan or thoughts currently.  Follows with behavioral health but provider recently went on maternity leave, planning to follow-up with new provider and consider medication adjustments.  Protective factor in son that provides some support.

## 2023-12-04 NOTE — Assessment & Plan Note (Signed)
 Last TSH 17 in the setting of running out of her medication.  Now back on levothyroxine  300 mcg daily. -TSH

## 2023-12-04 NOTE — Assessment & Plan Note (Signed)
 Last A1c 9.7, not checking CBGs at home but reports compliance on insulin  and Mounjaro .  Recommend keeping home glucose log and will increase Mounjaro  on next refill. -Increase Mounjaro  15 mg weekly -Restart rosuvastatin  20 mg daily -Lipid panel, BMP

## 2023-12-04 NOTE — Progress Notes (Signed)
 I saw and examined the patient and have discussed with the resident. I agree with the resident's note. I supervised and was present for the key portions of the procedure.

## 2023-12-04 NOTE — Patient Instructions (Addendum)
 It was wonderful to see you today! Thank you for choosing [facility].   Please bring ALL of your medications with you to every visit.   Today we talked about:  I ordered some blood work to repeat your blood cell counts but in the meantime please continue take oral iron.  We also get a check up on your kidney function again and I will let you know if there is concern for adjusting your medications or needing referral to nephrology. Unfortunately we are maxed out on all of your therapies for your sciatica and for your neuropathy.  I would recommend following up with the neurologist and pain management clinic to discuss other options as unfortunately I do not have anything else available.  I do think therapy is a great option and doing some aquatic therapy can help your symptoms further. We did injections in your knees today, please keep an eye on your blood sugars and ask week to make sure they are not going too high and adjust your insulin  as needed.  If they are going consistently greater than 300 please let us  know.  If the injections help we can repeat them in 3 months. I do recommend following up with behavioral health since her mood has been low.  I do think they can provide you with more discussion about your medication options and possibly help with your symptoms further. We are going to increase your Mounjaro  to 15 mg weekly at your next refill.  This will hopefully help with your A1c further and is the max dose of the medication.  You may need to adjust your insulin  but we will see what your blood sugars run when you started. I also ordered a mammogram to be done at the breast center.  Please follow up in 6 weeks for A1c and diabetes check   If you haven't already, sign up for My Chart to have easy access to your labs results, and communication with your primary care physician.   We are checking some labs today. If they are abnormal, I will call you. If they are normal, I  will send you a MyChart message (if it is active) or a letter in the mail. If you do not hear about your labs in the next 2 weeks, please call the office.  Call the clinic at [phone] if your symptoms worsen or you have any concerns.  Please be sure to schedule follow up at the front desk before you leave today.   Izetta Nap, DO Family Medicine    Joint Injection, Care After Refer to this sheet in the next few weeks. These instructions provide you with information about caring for yourself after your procedure. Your health care provider may also give you more specific instructions. Your treatment has been planned according to current medical practices, but problems sometimes occur. Call your health care provider if you have any problems or questions after your procedure. What can I expect after the procedure? After the procedure, it is common to have: Soreness. Warmth. Swelling. You may have more pain, swelling, and warmth than you did before the injection. This reaction may last for about one day. Follow these instructions at home: Bathing If you were given a bandage (dressing), keep it dry until your health care provider says it can be removed. Ask your health care provider when you can start showering or taking a bath. Managing pain, stiffness, and swelling If directed, apply ice to the injection area: Put  ice in a plastic bag. Place a towel between your skin and the bag. Leave the ice on for 20 minutes, 2-3 times per day. Do not apply heat to your knee. Raise the injection area above the level of your heart while you are sitting or lying down. Activity Avoid strenuous activities for as long as directed by your health care provider. Ask your health care provider when you can return to your normal activities. General instructions Take medicines only as directed by your health care provider. Do not take aspirin  or other over-the-counter medicines unless your health care  provider says you can. Check your injection site every day for signs of infection. Watch for: Redness, swelling, or pain. Fluid, blood, or pus. Follow your health care provider's instructions about dressing changes and removal. Contact a health care provider if: You have symptoms at your injection site that last longer than two days after your procedure. You have redness, swelling, or pain in your injection area. You have fluid, blood, or pus coming from your injection site. You have warmth in your injection area. You have a fever. Your pain is not controlled with medicine. Get help right away if:

## 2023-12-04 NOTE — Assessment & Plan Note (Signed)
 Taking iron supplementation daily, no sources of bleeding. -CBC, ferritin -Refill iron supplementation daily

## 2023-12-04 NOTE — Progress Notes (Signed)
 SUBJECTIVE:   CHIEF COMPLAINT / HPI:   Diabetes Current Regimen: Mounjaro  12.5 mg weekly, Lantus  65 units twice daily CBGs: Not checking Last A1c:  Lab Results  Component Value Date   HGBA1C 9.7 (H) 11/02/2023    Denies polyuria, polydipsia, hypoglycemia. Last Eye Exam: Due - needs to get done Statin: Rosuvastatin  20mg   ACE/ARB: Losartan  25 mg daily  Bipolar depression Has not been able to follow up with Behavioral Health b/c FNP is on maternity leave. Missed her last visit, nervous about change. Feels like she is really struggling with her mental health. Denies SI today.   Bilateral knee pain Seeing [facility] for back pain. Did not have knee injection through them. R knee worse than L but having pain on both sides. Did PT - feeling better after therapy but still sore.  Hoping to do some aquatic therapy.  Less swelling in the legs - feels like the Lasix  40 mg daily is doing fine for her.  Bilateral feet pain On Gabapentin , max dose. Tried Lyrica  but did not helped. Tried Duloxetine  but did not help.  Unfortunately his had difficulty getting relief.  Has been seen by 2 different neurologist already.   PERTINENT  PMH / PSH: Sciatica with L sided radiculopathy, T2DM, bipolar depression, CHF, CKD4   OBJECTIVE:   BP (!) 143/78   Pulse 88   Wt (!) 340 lb 3.2 oz (154.3 kg)   SpO2 99%   BMI 47.45 kg/m    General: Alert, no apparent distress, well groomed HEENT: Normocephalic, atraumatic, moist mucus membranes, neck supple Respiratory: Normal respiratory effort GI: Non-distended Skin: No rashes, no jaundice Psych: Appropriate mood and affect MSK: Bilateral knees - No erythema or deformity - Mild effusion on right knee noted - TTP over medial joint lines and posterior knee, right worse than left - Full ROM in normal strength - Negative varus, valgus, anterior and posterior drawer bilaterally  Flowsheet Row Office Visit from 12/04/2023 in [facility]  - A Dept Of [place]. [facility]  PHQ-9 Total Score 23     ASSESSMENT/PLAN:   Assessment & Plan Type 2 diabetes mellitus with stage 4 chronic kidney disease, with long-term current use of insulin  (HCC) Last A1c 9.7, not checking CBGs at home but reports compliance on insulin  and Mounjaro .  Recommend keeping home glucose log and will increase Mounjaro  on next refill. -Increase Mounjaro  15 mg weekly -Restart rosuvastatin  20 mg daily -Lipid panel, BMP Chronic pain of both knees Bilateral symptoms, right >left.  Limited pain management options as has CKD 4 and already sees pain clinic but they do not address her knee pain.  Received bilateral steroid injections as below.  Continue Tylenol  and ice in addition to PT for supportive care. Major depressive disorder, recurrent severe without psychotic features (HCC) PHQ-9: 23, positive for SI.  Discussed, denies any active plan or thoughts currently.  Follows with behavioral health but provider recently went on maternity leave, planning to follow-up with new provider and consider medication adjustments.  Protective factor in son that provides some support. Hypothyroidism, unspecified type Last TSH 17 in the setting of running out of her medication.  Now back on levothyroxine  300 mcg daily. -TSH Iron deficiency anemia, unspecified iron deficiency anemia type Taking iron supplementation daily, no sources of bleeding. -CBC, ferritin -Refill iron supplementation daily Gastroesophageal reflux disease, unspecified whether esophagitis present Significant improvement with PPI.  Refill pantoprazole  40 mg daily Chronic combined systolic and diastolic congestive  heart failure (HCC) Euvolemic, refill Lasix  40 mg daily in addition to GDMT. Encounter for immunization Received flu shot Encounter for screening mammogram for malignant neoplasm of breast Mammogram ordered   Right knee steroid injection After informed written consent timeout was  performed, patient was seated on exam table. Right knee was prepped with alcohol swab and utilizing anteromedial approach, patient's right knee was injected intraarticularly with 4:1 lidocaine : depomedrol. Patient tolerated the procedure well without immediate complications.   Left knee steroid injection After informed written consent timeout was performed, patient was seated on exam table. Left knee was prepped with alcohol swab and utilizing anteromedial approach, patient's left knee was injected intraarticularly with 4:1 lidocaine : depomedrol. Patient tolerated the procedure well without immediate complications.   Dr. Izetta Nap, DO [facility]

## 2023-12-04 NOTE — Assessment & Plan Note (Signed)
 Bilateral symptoms, right >left.  Limited pain management options as has CKD 4 and already sees pain clinic but they do not address her knee pain.  Received bilateral steroid injections as below.  Continue Tylenol  and ice in addition to PT for supportive care.

## 2023-12-05 LAB — BASIC METABOLIC PANEL WITH GFR
BUN/Creatinine Ratio: 18 (ref 9–23)
BUN: 44 mg/dL — ABNORMAL HIGH (ref 6–24)
CO2: 19 mmol/L — ABNORMAL LOW (ref 20–29)
Calcium: 9.3 mg/dL (ref 8.7–10.2)
Chloride: 104 mmol/L (ref 96–106)
Creatinine, Ser: 2.41 mg/dL — ABNORMAL HIGH (ref 0.57–1.00)
Glucose: 108 mg/dL — ABNORMAL HIGH (ref 70–99)
Potassium: 4.8 mmol/L (ref 3.5–5.2)
Sodium: 141 mmol/L (ref 134–144)
eGFR: 23 mL/min/1.73 — ABNORMAL LOW (ref >59)

## 2023-12-05 LAB — CBC
Hematocrit: 31.6 % — ABNORMAL LOW (ref 34.0–46.6)
Hemoglobin: 9.7 g/dL — ABNORMAL LOW (ref 11.1–15.9)
MCH: 26.3 pg — ABNORMAL LOW (ref 26.6–33.0)
MCHC: 30.7 g/dL — ABNORMAL LOW (ref 31.5–35.7)
MCV: 86 fL (ref 79–97)
Platelets: 451 x10E3/uL — ABNORMAL HIGH (ref 150–450)
RBC: 3.69 x10E6/uL — ABNORMAL LOW (ref 3.77–5.28)
RDW: 14.8 % (ref 11.7–15.4)
WBC: 7.5 x10E3/uL (ref 3.4–10.8)

## 2023-12-05 LAB — LIPID PANEL
Chol/HDL Ratio: 4.4 ratio (ref 0.0–4.4)
Cholesterol, Total: 196 mg/dL (ref 100–199)
HDL: 45 mg/dL (ref >39)
LDL Chol Calc (NIH): 119 mg/dL — ABNORMAL HIGH (ref 0–99)
Triglycerides: 179 mg/dL — ABNORMAL HIGH (ref 0–149)
VLDL Cholesterol Cal: 32 mg/dL (ref 5–40)

## 2023-12-05 LAB — TSH: TSH: 1.06 u[IU]/mL (ref 0.450–4.500)

## 2023-12-05 LAB — FERRITIN: Ferritin: 25 ng/mL (ref 15–150)

## 2023-12-08 ENCOUNTER — Ambulatory Visit

## 2023-12-10 ENCOUNTER — Ambulatory Visit: Attending: Family Medicine | Admitting: Physical Therapy

## 2023-12-10 ENCOUNTER — Telehealth: Payer: Self-pay | Admitting: *Deleted

## 2023-12-10 DIAGNOSIS — M5442 Lumbago with sciatica, left side: Secondary | ICD-10-CM | POA: Insufficient documentation

## 2023-12-10 DIAGNOSIS — R2689 Other abnormalities of gait and mobility: Secondary | ICD-10-CM | POA: Diagnosis present

## 2023-12-10 DIAGNOSIS — M6281 Muscle weakness (generalized): Secondary | ICD-10-CM | POA: Insufficient documentation

## 2023-12-10 DIAGNOSIS — M5459 Other low back pain: Secondary | ICD-10-CM | POA: Insufficient documentation

## 2023-12-10 NOTE — Therapy (Signed)
 OUTPATIENT PHYSICAL THERAPY THORACOLUMBAR EVALUATION   Patient Name: [name] MRN: [id number] DOB:[birth year], 53 y.o., female Today's Date: 12/10/2023  END OF SESSION:  PT End of Session - 12/10/23 1233     Visit Number 4    Number of Visits 8    Date for Recertification  01/18/24    PT Start Time 1210    PT Stop Time 1250    PT Time Calculation (min) 40 min    Activity Tolerance Patient tolerated treatment well    Behavior During Therapy [facility] for tasks assessed/performed            Past Medical History:  Diagnosis Date   Acute renal failure 05/27/2010   hemodialysis for 6 weeks   Anxiety    Asthma    Depression    Diabetes mellitus    Hypertension    Rhabdomyolysis 06/06/2010   after drug overdose   Suicide attempt by drug ingestion (HCC) 06/05/2010   result rhabdomyolosis and ARF requrining dialysis    SVT (supraventricular tachycardia) 05/28/2021   Past Surgical History:  Procedure Laterality Date   ANKLE ARTHROSCOPY Right 08/17/2015   Procedure: RIGHT ANKLE ARTHROSCOPY WITH SYNOVECTOMY AND LOOSE BODY EXCISION;  Surgeon: Maude Herald, MD;  Location: [facility] OR;  Service: Orthopedics;  Laterality: Right;   CHOLECYSTECTOMY     TUBAL LIGATION  1998   Patient Active Problem List   Diagnosis Date Noted   Neuropathic pain 11/03/2023   Sciatica of left side 11/03/2023   Chronic health problem 11/02/2023   Hypomagnesemia 11/02/2023   SOB (shortness of breath) 11/02/2023   Weakness of both lower limbs 11/02/2023   Acute on chronic congestive heart failure (HCC) 11/01/2023   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   Chronic low back pain with left-sided sciatica 07/08/2022   Chronic systolic CHF (congestive heart failure) (HCC) - LVEF 35-40% by echo 03-2023    Bilateral knee pain 03/14/2019   HLD (hyperlipidemia) 03/19/2017   Asthma 03/14/2017   Major depressive disorder, recurrent severe without psychotic features (HCC) 05/11/2015   Vitamin D  deficiency 04/11/2015   CKD  stage 3b, GFR 30-44 ml/min (HCC) - baseline Scr 1.7-2.1 11/06/2010   Essential hypertension, benign 02/03/2007   Hypothyroidism 05/07/2006   T2DM (type 2 diabetes mellitus) (HCC) 05/07/2006   Obesity, Class III, BMI 40-49.9 (morbid obesity) (HCC) 05/07/2006   Iron deficiency anemia 05/07/2006   Obsessive-compulsive disorder 05/07/2006   Generalized anxiety disorder 05/07/2006    PCP: Theophilus Pagan, MD   REFERRING PROVIDER: Anders Otto DASEN, MD  REFERRING DIAG: Unsteadiness on feet (R26.81);Pain  Rationale for Evaluation and Treatment: Rehabilitation  THERAPY DIAG:  Other low back pain  Low back pain with left-sided sciatica, unspecified back pain laterality, unspecified chronicity  Muscle weakness (generalized)  ONSET DATE: 16 months  SUBJECTIVE:  SUBJECTIVE STATEMENT: Pt attended today's session with reports of 8/10 pain. Pt stated that they have maintained good compliance with current HEP.  Felt sore after last session, however pain is unchanged today.   53 y.o. female presenting for an initial neurosurgical evaluation, at the kind request of Waylon Rockey SAUNDERS, MD secondary to Chronic LBP. Onset of symptoms began 16 months ago in March 2024 with a cramp in right buttock. This worsened to the point she stopped working in April 2024. 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 Since that time she has seen Dr Gillie as well as both [facility]. She says Dr Gillie discussed surgery if she lost weight, but then he changed his mind. She did PT, had NCS showing Neuropathy, had injections. Despite this her pain continues. She established with [facility] last week, started Oxycodone  5mg  2-6 times each day. She is typically taking BID. She describes pain in left low back, buttock,  hamstring, both calf and shin. This is sharp, like water running down lateral calf. She has paraesthesias to both feet, feel either hot or cold. No radicular pain in right leg. Pain is worse with standing, only tolerates activity for a brief period of time and then must sit. She has begun using walker or wheelchair due to this. Her mobility is limited due to back pain and left leg weakness. She fell 1 month ago due to this. She has difficulty making it to bathroom on time due to limited mobility with pain in low back bothering her the most.   PERTINENT HISTORY:  53 y.o. female with severe low back pain and left leg pain, worse with standing, walking, activity. We reviewed her imaging noting a widely patent central canal with degenerative changes of facet joints, large effusions/facet hypertrophy. She also has significantly atrophied musculature of lumbar sacral region. We had a long discussion regarding arthritis and weakened muscles and their role in lon back pain. We discussed the role of conservative cares in treating back pain. This includes activity modification, physical therapy, medications including NSAIDs for flares, and injections. We discussed the relapsing nature of back pain. We discussed a spine-healthy lifestyle including a healthy diet, weight control, staying active, and maintaining core strength, stability, and lower extremity flexibility. We discussed that there is generally not a good surgical treatment for axial back pain unless it is associated with a mechanical/structural abnormality, radicular leg pain, weakness, or other neurologic signs/symptoms. We discussed physical therapy to work on balance as well as core strengthening for low back pain. I am providing exercises today.  I also mentioned role of facet injections, peripheral nerve stimulator and multifidus stimulator and how each may benefit her. She will follow up with Dr Waylon to discuss these options in greater detail.  As  she is not a surgical candidate, she does not need a follow up appointment.   PAIN:  Are you having pain? Yes: NPRS scale: 10/10 Pain location: low back, LLE Pain description: ache, radiating Aggravating factors: activity Relieving factors: medication  PRECAUTIONS: None  RED FLAGS: None   WEIGHT BEARING RESTRICTIONS: No  FALLS:  Has patient fallen in last 6 months? Yes. Number of falls 1   OCCUPATION: not working  PLOF: Independent  PATIENT GOALS: To walk independently again  NEXT MD VISIT: TBD  OBJECTIVE:  Note: Objective measures were completed at Evaluation unless otherwise noted.  DIAGNOSTIC FINDINGS:  Multilevel degenerate age, greatest at at L4-L5 where there are  bilateral facet joint  effusions with perifacet inflammatory  change/arthropathy and mild bilateral foraminal stenosis.   PATIENT SURVEYS:  Patient-specific activity scoring scheme (Point to one number):  0 represents "unable to perform." 10 represents "able to perform at prior level. 0 1 2 3 4 5 6 7 8 9  10 (Date and Score) Activity Initial  Activity Eval     Standing >30s   2    Walking 5 min  0    Standing from sitting 5     Total score = sum of the activity scores/number of activities Minimum detectable change (90%CI) for average score = 2 points Minimum detectable change (90%CI) for single activity score = 3 points PSFS developed by: Rosalee MYRTIS Marvis KYM Charlet CHRISTELLA., & Binkley, J. (1995). Assessing disability and change on individual  patients: a report of a patient specific measure. Physiotherapy [place], 47, [phone]. Reproduced with the permission of the authors  Score: 7/30 23% perceived function   MUSCLE LENGTH: Hamstrings: Right 90 deg; Left 90 deg Thomas test: TBD  POSTURE: UTA to excessive WS to L  PALPATION: deferred  LUMBAR ROM:   AROM eval  Flexion NT  Extension NT  Right lateral flexion NT  Left lateral flexion NT  Right rotation 75%  Left rotation 75%    (Blank rows = not tested)  LOWER EXTREMITY ROM:     Active  Right eval Left eval  Hip flexion    Hip extension    Hip abduction    Hip adduction    Hip internal rotation 0d 0d  Hip external rotation    Knee flexion    Knee extension    Ankle dorsiflexion    Ankle plantarflexion    Ankle inversion    Ankle eversion     (Blank rows = not tested)  LOWER EXTREMITY MMT:    MMT Right eval Left eval  Hip flexion    Hip extension    Hip abduction    Hip adduction    Hip internal rotation    Hip external rotation    Knee flexion    Knee extension    Ankle dorsiflexion    Ankle plantarflexion    Ankle inversion    Ankle eversion     (Blank rows = not tested)  LUMBAR SPECIAL TESTS:  Straight leg raise test: Negative and Slump test: Positive  FUNCTIONAL TESTS:  30 seconds chair stand test 0 reps  GAIT: Distance walked: 70ftx2 Assistive device utilized: Quad cane small base Level of assistance: Complete Independence Comments: slow cadence , WS L  TREATMENT:      [facility] Adult PT Treatment:                                                DATE: 12/10/2023 Therapeutic Exercise: NuStep 8' Seated hamstring stretch 2x1' B  Neuromuscular re-ed: Supine bridge 3x8, hold 3s Supine LTR on Pball 2x12 Supine pball roll up 2x12, hold 1s    [facility] Adult PT Treatment:                                                DATE: 12/03/2023  Therapeutic Exercise: Nustep 6' Supine QL stretch 2x30s Neuromuscular re-ed: Seated pball press 2x15, hold 4s Supine  LTR 2x12 Supine marching 2x12B, hold 1s Cue for core bracing STS  2x8  Pt requires additional time for exercise      [facility] Adult PT Treatment:                                                DATE: 11/24/2023 Therapeutic Exercise: Seated hamstring stretch 2x1' B Pball rollout Ts 2x12 Supine piriformis push/pull 2x1'B Therapeutic Activity: NuStep 8' for activity tolerance Supine marching 2x10B                                                                                                                            [facility] Adult PT Treatment:                                                DATE: 11/18/23 Eval and HEP Self Care:  Additional minutes spent for educating on updated Therapeutic Home Exercise Program as well as comparing current status to condition at start of symptoms. This included exercises focusing on stretching, strengthening, with focus on eccentric aspects. Long term goals include an improvement in range of motion, strength, endurance as well as avoiding reinjury. Patient's frequency would include in 1-2 times a day, 3-5 times a week for a duration of 6-12 weeks. Proper technique shown and discussed handout in great detail. All questions were discussed and addressed.        PATIENT EDUCATION:  Education details: Discussed eval findings, rehab rationale and POC and patient is in agreement  Person educated: Patient Education method: Explanation and Handouts Education comprehension: verbalized understanding and needs further education  HOME EXERCISE PROGRAM: Access Code: [email or web address] URL: [email or web address] Date: 12/10/2023 Prepared by: Mabel Kiang  Exercises - Seated Hamstring Stretch  - 1 x daily - 7 x weekly - 2 sets - 1 reps - 61m hold - Sit to Stand with Armchair  - 2 x daily - 5 x weekly - 1 sets - 5 reps - Supine Lower Trunk Rotation  - 1 x daily - 7 x weekly - 3 sets - 10 reps - Supine Bridge  - 1 x daily - 4 x weekly - 2-3 sets - 12 reps - 4s hold  ASSESSMENT:  CLINICAL IMPRESSION: Pt attended physical therapy session for continuation of treatment regarding LBP. Today's treatment focused on improvement of  lumbar stability, posterior chain motility proximal hip strength, and activity tolerance. Pt continues to report plataue with back pain and general function. Pt showed good tolerance to administered treatment with no adverse effects by the end of session.  Skilled intervention was utilized via activity modification for pt tolerance with task completion, functional progression/regression promoting best outcomes inline with current rehab goals, as  well as minimal verbal/tactile cuing alongside no physical assistance for safe and appropriate performance of today's activities. Continue with therapeutic focus on current POC outline.   Patient is a 53 y.o. female who was seen today for physical therapy evaluation and treatment for chronic low back and L radicular symptoms. Patient unable to stand safely to assess trunk ROM as standing tolerance is less than 30s.  Marked mobility restrictions in B hip IR but good hamstring flexibility observed.  Patient understands rationale for rehab and is agreeable to POC   OBJECTIVE IMPAIRMENTS: Abnormal gait, decreased activity tolerance, decreased balance, decreased endurance, decreased knowledge of condition, decreased mobility, difficulty walking, decreased strength, improper body mechanics, postural dysfunction, obesity, and pain.   ACTIVITY LIMITATIONS: carrying, lifting, bending, sitting, standing, squatting, and stairs  PERSONAL FACTORS: Behavior pattern, Fitness, Past/current experiences, and Time since onset of injury/illness/exacerbation are also affecting patient's functional outcome.   REHAB POTENTIAL: Fair based on previous unsuccessful OPPT episodes, intractable pain and lack of definitive causation of symptoms  CLINICAL DECISION MAKING: Evolving/moderate complexity  EVALUATION COMPLEXITY: Moderate   GOALS: Goals reviewed with patient? No  SHORT TERM GOALS: Target date: 12/02/2023    Patient to demonstrate independence in HEP  Baseline: [id number] Goal status: INITIAL  2.  Able to stand from sitting with single UE support. Baseline: 0 reps Goal status: INITIAL   LONG TERM GOALS: Target date: 12/16/2023  Patient will acknowledge 6/10 pain at least once during episode of care   Baseline:  10/10 Goal status: INITIAL  2.  Patient will score at least 13/30 on PSFS to signify clinically meaningful improvement in functional abilities.   Baseline: 7/30 Goal status: INITIAL  3.  Patient will increase 30s chair stand reps from 0 to 2 with/without arms to demonstrate and improved functional ability with less pain/difficulty as well as reduce fall risk.  Baseline: 0 Goal status: INITIAL  4.  Patient to ambulate 231ft across level ground with LRAD Baseline: 24ft with SBQC Goal status: INITIAL    PLAN:  PT FREQUENCY: 2x/week  PT DURATION: 4 weeks  PLANNED INTERVENTIONS: 97110-Therapeutic exercises, 97530- Therapeutic activity, [id number]- Neuromuscular re-education, 97535- Self Care, [id number]- Manual therapy, [phone]- Gait training, Patient/Family education, Balance training, and Stair training.  PLAN FOR NEXT SESSION: HEP review and update, manual techniques as appropriate, aerobic tasks, ROM and flexibility activities, strengthening and PREs, TPDN, gait and balance training as needed   Date of referral: 10/30/23 Referring provider: Anders Otto DASEN, MD Referring diagnosis? Unsteadiness on feet (R26.81);Pain Treatment diagnosis? (if different than referring diagnosis) M54.42,G89.29 (ICD-10-CM) - Chronic bilateral low back pain with left-sided sciatica  What was this (referring dx) caused by? Ongoing Issue  Lysle of Condition: Chronic (continuous duration > 3 months)   Laterality: Lt  Current Functional Measure Score: Other PSFS 7/30  Objective measurements identify impairments when they are compared to normal values, the uninvolved extremity, and prior level of function.  [x]  Yes  []  No  Objective assessment of functional ability: Moderate functional limitations   Briefly describe symptoms: 53 y.o. female with severe low back pain and left leg pain, worse with standing, walking, activity  How did symptoms start: chronic  Average pain intensity:  Last 24 hours: 8  Past  week: 8  How often does the pt experience symptoms? Constantly  How much have the symptoms interfered with usual daily activities? Quite a bit  How has condition changed since care began at this facility? NA - initial visit  In general, how is  the patients overall health? Good   BACK PAIN (STarT Back Screening Tool) Has pain spread down the leg(s) at some time in the last 2 weeks? y Has there been pain in the shoulder or neck at some time in the last 2 weeks? n Has the pt only walked short distances because of back pain? y Has patient dressed more slowly because of back pain in the past 2 weeks? y Does patient think it's not safe for a person with this condition to be physically active? y Does patient have worrying thoughts a lot of the time? y Does patient feel back pain is terrible and will never get any better? y Has patient stopped enjoying things they usually enjoy? y     Mabel Kiang, PT, DPT 12/10/2023, 12:49 PM

## 2023-12-10 NOTE — Telephone Encounter (Signed)
 Pt LM on RN line requesting medications for UTI.  Called back, no answer and no machine.  Will verify with PCP, but will likely need an appt. Harlene Carte, CMA

## 2023-12-11 ENCOUNTER — Ambulatory Visit

## 2023-12-11 ENCOUNTER — Ambulatory Visit
Admission: RE | Admit: 2023-12-11 | Discharge: 2023-12-11 | Disposition: A | Discharge status: Home / Self Care | Source: Ambulatory Visit | Attending: Family Medicine | Admitting: Family Medicine

## 2023-12-11 VITALS — BP 116/70 | HR 89 | Ht 71.0 in | Wt 336.0 lb

## 2023-12-11 DIAGNOSIS — R300 Dysuria: Secondary | ICD-10-CM | POA: Diagnosis not present

## 2023-12-11 DIAGNOSIS — Z1231 Encounter for screening mammogram for malignant neoplasm of breast: Secondary | ICD-10-CM

## 2023-12-11 LAB — POCT URINALYSIS DIP (MANUAL ENTRY)
Bilirubin, UA: NEGATIVE
Glucose, UA: NEGATIVE mg/dL
Ketones, POC UA: NEGATIVE mg/dL
Nitrite, UA: NEGATIVE
Protein Ur, POC: 30 mg/dL — AB
Spec Grav, UA: 1.02 (ref 1.010–1.025)
Urobilinogen, UA: 0.2 U/dL
pH, UA: 5.5 (ref 5.0–8.0)

## 2023-12-11 LAB — POCT UA - MICROSCOPIC ONLY: Epithelial cells, urine per micros: >20

## 2023-12-11 LAB — POCT WET PREP (WET MOUNT)
Clue Cells Wet Prep Whiff POC: NEGATIVE
Trichomonas Wet Prep HPF POC: ABSENT

## 2023-12-11 MED ORDER — CLOTRIMAZOLE 1 % VA CREA: 1.0000 | TOPICAL_CREAM | Freq: Every day | VAGINAL | 0 refills | Status: AC

## 2023-12-11 NOTE — Patient Instructions (Signed)
 Good to see you today - Thank you for coming in  Things we discussed today:  I have sent in a medicine to treat for yeast infection.  You will insert this into your vagina every night for 7 nights.  If your symptoms worsen or do not improve, please come back to see us .

## 2023-12-11 NOTE — Progress Notes (Signed)
    SUBJECTIVE:   CHIEF COMPLAINT / HPI:   Reports dysuria for 5 days, when the urine stream starts and when she wipes She has had difficulty bathing due to the pain Denies vaginal discharge, vaginal bleeding, abdominal pain, pelvic pain Denies concern for STD  PERTINENT  PMH / PSH: HTN, CHF, CKD, HLD  OBJECTIVE:   BP 116/70   Pulse 89   Ht 5' 11 (1.803 m)   Wt (!) 336 lb (152.4 kg)   SpO2 100%   BMI 46.86 kg/m   General: well appearing, NAD Pelvic: Pt declined pelvic exam  ASSESSMENT/PLAN:   Assessment & Plan Dysuria Urine dipstick negative for infection, urine culture sent.  Patient declined pelvic exam due to difficulty getting on the table, so opted for self swab to do wet prep which was unremarkable.  Her symptoms do sound consistent with yeast infection so we will treat for that. Clotrimazole suppository nightly for 7 days, return precautions discussed     Elyce Prescott, DO [facility]

## 2023-12-14 ENCOUNTER — Ambulatory Visit: Payer: Self-pay | Admitting: Family Medicine

## 2023-12-14 LAB — URINE CULTURE

## 2023-12-14 MED ORDER — CEPHALEXIN 500 MG PO CAPS: 500.0000 mg | ORAL_CAPSULE | Freq: Two times a day (BID) | ORAL | 0 refills | Status: AC

## 2023-12-14 NOTE — Telephone Encounter (Signed)
 Patient calls nurse line in regards to UTI symptoms.   She reports continued dysuria and chills. She reports she has been miserable all weekend.   She denies any fevers, hematuria, belly pain or back pain.   She is requesting an antibiotic to help with her symptoms.   Encouraged hydration.   Will forward to provider who saw patient for concern.

## 2023-12-15 ENCOUNTER — Ambulatory Visit: Admitting: Physical Therapy

## 2023-12-15 NOTE — Therapy (Deleted)
 OUTPATIENT PHYSICAL THERAPY THORACOLUMBAR EVALUATION   Patient Name: [name] MRN: [id number] DOB:[birth year], 53 y.o., female Today's Date: 12/15/2023  END OF SESSION:      Past Medical History:  Diagnosis Date   Acute renal failure 05/27/2010   hemodialysis for 6 weeks   Anxiety    Asthma    Depression    Diabetes mellitus    Hypertension    Rhabdomyolysis 06/06/2010   after drug overdose   Suicide attempt by drug ingestion (HCC) 06/05/2010   result rhabdomyolosis and ARF requrining dialysis    SVT (supraventricular tachycardia) 05/28/2021   Past Surgical History:  Procedure Laterality Date   ANKLE ARTHROSCOPY Right 08/17/2015   Procedure: RIGHT ANKLE ARTHROSCOPY WITH SYNOVECTOMY AND LOOSE BODY EXCISION;  Surgeon: Samantha Herald, MD;  Location: [facility] OR;  Service: Orthopedics;  Laterality: Right;   CHOLECYSTECTOMY     TUBAL LIGATION  1998   Patient Active Problem List   Diagnosis Date Noted   Neuropathic pain 11/03/2023   Sciatica of left side 11/03/2023   Chronic health problem 11/02/2023   Hypomagnesemia 11/02/2023   SOB (shortness of breath) 11/02/2023   Weakness of both lower limbs 11/02/2023   Acute on chronic congestive heart failure (HCC) 11/01/2023   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   Chronic low back pain with left-sided sciatica 07/08/2022   Chronic systolic CHF (congestive heart failure) (HCC) - LVEF 35-40% by echo 03-2023    Bilateral knee pain 03/14/2019   HLD (hyperlipidemia) 03/19/2017   Asthma 03/14/2017   Major depressive disorder, recurrent severe without psychotic features (HCC) 05/11/2015   Vitamin D  deficiency 04/11/2015   CKD stage 3b, GFR 30-44 ml/min (HCC) - baseline Scr 1.7-2.1 11/06/2010   Essential hypertension, benign 02/03/2007   Hypothyroidism 05/07/2006   T2DM (type 2 diabetes mellitus) (HCC) 05/07/2006   Obesity, Class III, BMI 40-49.9 (morbid obesity) (HCC) 05/07/2006   Iron deficiency anemia 05/07/2006   Obsessive-compulsive  disorder 05/07/2006   Generalized anxiety disorder 05/07/2006    PCP: Samantha Pagan, MD   REFERRING PROVIDER: Anders Otto DASEN, MD  REFERRING DIAG: Unsteadiness on feet (R26.81);Pain  Rationale for Evaluation and Treatment: Rehabilitation  THERAPY DIAG:  No diagnosis found.  ONSET DATE: 16 months  SUBJECTIVE:                                                                                                                                                                                           SUBJECTIVE STATEMENT: Pt attended today's session with reports of 8/10 pain. Pt stated that they have maintained good compliance with current HEP.  Felt sore after last session, however pain is unchanged  today.   53 y.o. female presenting for an initial neurosurgical evaluation, at the kind request of Samantha Rockey SAUNDERS, MD secondary to Chronic LBP. Onset of symptoms began 16 months ago in March 2024 with a cramp in right buttock. This worsened to the point she stopped working in April 2024. 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 Since that time she has seen Dr Gillie as well as both [facility] and [facility]. She says Dr Gillie discussed surgery if she lost weight, but then he changed his mind. She did PT, had NCS showing Neuropathy, had injections. Despite this her pain continues. She established with [facility] last week, started Oxycodone  5mg  2-6 times each day. She is typically taking BID. She describes pain in left low back, buttock, hamstring, both calf and shin. This is sharp, like water running down lateral calf. She has paraesthesias to both feet, feel either hot or cold. No radicular pain in right leg. Pain is worse with standing, only tolerates activity for a brief period of time and then must sit. She has begun using walker or wheelchair due to this. Her mobility is limited due to back pain and left leg weakness. She fell 1 month ago due to this. She has difficulty making it to  bathroom on time due to limited mobility with pain in low back bothering her the most.   PERTINENT HISTORY:  53 y.o. female with severe low back pain and left leg pain, worse with standing, walking, activity. We reviewed her imaging noting a widely patent central canal with degenerative changes of facet joints, large effusions/facet hypertrophy. She also has significantly atrophied musculature of lumbar sacral region. We had a long discussion regarding arthritis and weakened muscles and their role in lon back pain. We discussed the role of conservative cares in treating back pain. This includes activity modification, physical therapy, medications including NSAIDs for flares, and injections. We discussed the relapsing nature of back pain. We discussed a spine-healthy lifestyle including a healthy diet, weight control, staying active, and maintaining core strength, stability, and lower extremity flexibility. We discussed that there is generally not a good surgical treatment for axial back pain unless it is associated with a mechanical/structural abnormality, radicular leg pain, weakness, or other neurologic signs/symptoms. We discussed physical therapy to work on balance as well as core strengthening for low back pain. I am providing exercises today.  I also mentioned role of facet injections, peripheral nerve stimulator and multifidus stimulator and how each may benefit her. She will follow up with Dr Samantha to discuss these options in greater detail.  As she is not a surgical candidate, she does not need a follow up appointment.   PAIN:  Are you having pain? Yes: NPRS scale: 10/10 Pain location: low back, LLE Pain description: ache, radiating Aggravating factors: activity Relieving factors: medication  PRECAUTIONS: None  RED FLAGS: None   WEIGHT BEARING RESTRICTIONS: No  FALLS:  Has patient fallen in last 6 months? Yes. Number of falls 1   OCCUPATION: not working  PLOF:  Independent  PATIENT GOALS: To walk independently again  NEXT MD VISIT: TBD  OBJECTIVE:  Note: Objective measures were completed at Evaluation unless otherwise noted.  DIAGNOSTIC FINDINGS:  Multilevel degenerate age, greatest at at L4-L5 where there are  bilateral facet joint effusions with perifacet inflammatory  change/arthropathy and mild bilateral foraminal stenosis.   PATIENT SURVEYS:  Patient-specific activity scoring scheme (Point to one number):  0 represents "unable to perform." 10 represents "  able to perform at prior level. 0 1 2 3 4 5 6 7 8 9  10 (Date and Score) Activity Initial  Activity Eval     Standing >30s   2    Walking 5 min  0    Standing from sitting 5     Total score = sum of the activity scores/number of activities Minimum detectable change (90%CI) for average score = 2 points Minimum detectable change (90%CI) for single activity score = 3 points PSFS developed by: Rosalee MYRTIS Marvis KYM Charlet CHRISTELLA., & Binkley, J. (1995). Assessing disability and change on individual  patients: a report of a patient specific measure. Physiotherapy [place], 47, [phone]. Reproduced with the permission of the authors  Score: 7/30 23% perceived function   MUSCLE LENGTH: Hamstrings: Right 90 deg; Left 90 deg Thomas test: TBD  POSTURE: UTA to excessive WS to L  PALPATION: deferred  LUMBAR ROM:   AROM eval  Flexion NT  Extension NT  Right lateral flexion NT  Left lateral flexion NT  Right rotation 75%  Left rotation 75%   (Blank rows = not tested)  LOWER EXTREMITY ROM:     Active  Right eval Left eval  Hip flexion    Hip extension    Hip abduction    Hip adduction    Hip internal rotation 0d 0d  Hip external rotation    Knee flexion    Knee extension    Ankle dorsiflexion    Ankle plantarflexion    Ankle inversion    Ankle eversion     (Blank rows = not tested)  LOWER EXTREMITY MMT:    MMT Right eval Left eval  Hip flexion    Hip  extension    Hip abduction    Hip adduction    Hip internal rotation    Hip external rotation    Knee flexion    Knee extension    Ankle dorsiflexion    Ankle plantarflexion    Ankle inversion    Ankle eversion     (Blank rows = not tested)  LUMBAR SPECIAL TESTS:  Straight leg raise test: Negative and Slump test: Positive  FUNCTIONAL TESTS:  30 seconds chair stand test 0 reps  GAIT: Distance walked: 51ftx2 Assistive device utilized: Quad cane small base Level of assistance: Complete Independence Comments: slow cadence , WS L  TREATMENT:      [facility] Adult PT Treatment:                                                DATE: 12/10/2023 Therapeutic Exercise: NuStep 8' Seated hamstring stretch 2x1' B  Neuromuscular re-ed: Supine bridge 3x8, hold 3s Supine LTR on Pball 2x12 Supine pball roll up 2x12, hold 1s    [facility] Adult PT Treatment:                                                DATE: 12/03/2023  Therapeutic Exercise: Nustep 6' Supine QL stretch 2x30s Neuromuscular re-ed: Seated pball press 2x15, hold 4s Supine LTR 2x12 Supine marching 2x12B, hold 1s Cue for core bracing STS  2x8  Pt requires additional time for exercise      [facility] Adult PT Treatment:  DATE: 11/24/2023 Therapeutic Exercise: Seated hamstring stretch 2x1' B Pball rollout Ts 2x12 Supine piriformis push/pull 2x1'B Therapeutic Activity: NuStep 8' for activity tolerance Supine marching 2x10B                                                                                                                           [facility] Adult PT Treatment:                                                DATE: 11/18/23 Eval and HEP Self Care:  Additional minutes spent for educating on updated Therapeutic Home Exercise Program as well as comparing current status to condition at start of symptoms. This included exercises focusing on stretching, strengthening,  with focus on eccentric aspects. Long term goals include an improvement in range of motion, strength, endurance as well as avoiding reinjury. Patient's frequency would include in 1-2 times a day, 3-5 times a week for a duration of 6-12 weeks. Proper technique shown and discussed handout in great detail. All questions were discussed and addressed.        PATIENT EDUCATION:  Education details: Discussed eval findings, rehab rationale and POC and patient is in agreement  Person educated: Patient Education method: Explanation and Handouts Education comprehension: verbalized understanding and needs further education  HOME EXERCISE PROGRAM: Access Code: [email or web address] URL: [email or web address] Date: 12/10/2023 Prepared by: Mabel Kiang  Exercises - Seated Hamstring Stretch  - 1 x daily - 7 x weekly - 2 sets - 1 reps - 3m hold - Sit to Stand with Armchair  - 2 x daily - 5 x weekly - 1 sets - 5 reps - Supine Lower Trunk Rotation  - 1 x daily - 7 x weekly - 3 sets - 10 reps - Supine Bridge  - 1 x daily - 4 x weekly - 2-3 sets - 12 reps - 4s hold  ASSESSMENT:  CLINICAL IMPRESSION: Pt attended physical therapy session for continuation of treatment regarding LBP. Today's treatment focused on improvement of  lumbar stability, posterior chain motility proximal hip strength, and activity tolerance. Pt continues to report plataue with back pain and general function. Pt showed good tolerance to administered treatment with no adverse effects by the end of session. Skilled intervention was utilized via activity modification for pt tolerance with task completion, functional progression/regression promoting best outcomes inline with current rehab goals, as well as minimal verbal/tactile cuing alongside no physical assistance for safe and appropriate performance of today's activities. Continue with therapeutic focus on current POC outline.   Patient is a 53 y.o. female who was seen today for  physical therapy evaluation and treatment for chronic low back and L radicular symptoms. Patient unable to stand safely to assess trunk ROM as standing tolerance is less than 30s.  Marked mobility restrictions in B hip IR but good  hamstring flexibility observed.  Patient understands rationale for rehab and is agreeable to POC   OBJECTIVE IMPAIRMENTS: Abnormal gait, decreased activity tolerance, decreased balance, decreased endurance, decreased knowledge of condition, decreased mobility, difficulty walking, decreased strength, improper body mechanics, postural dysfunction, obesity, and pain.   ACTIVITY LIMITATIONS: carrying, lifting, bending, sitting, standing, squatting, and stairs  PERSONAL FACTORS: Behavior pattern, Fitness, Past/current experiences, and Time since onset of injury/illness/exacerbation are also affecting patient's functional outcome.   REHAB POTENTIAL: Fair based on previous unsuccessful OPPT episodes, intractable pain and lack of definitive causation of symptoms  CLINICAL DECISION MAKING: Evolving/moderate complexity  EVALUATION COMPLEXITY: Moderate   GOALS: Goals reviewed with patient? No  SHORT TERM GOALS: Target date: 12/02/2023    Patient to demonstrate independence in HEP  Baseline: [id number] Goal status: INITIAL  2.  Able to stand from sitting with single UE support. Baseline: 0 reps Goal status: INITIAL   LONG TERM GOALS: Target date: 12/16/2023  Patient will acknowledge 6/10 pain at least once during episode of care   Baseline: 10/10 Goal status: INITIAL  2.  Patient will score at least 13/30 on PSFS to signify clinically meaningful improvement in functional abilities.   Baseline: 7/30 Goal status: INITIAL  3.  Patient will increase 30s chair stand reps from 0 to 2 with/without arms to demonstrate and improved functional ability with less pain/difficulty as well as reduce fall risk.  Baseline: 0 Goal status: INITIAL  4.  Patient to ambulate 274ft  across level ground with LRAD Baseline: 19ft with SBQC Goal status: INITIAL    PLAN:  PT FREQUENCY: 2x/week  PT DURATION: 4 weeks  PLANNED INTERVENTIONS: 97110-Therapeutic exercises, 97530- Therapeutic activity, [id number]- Neuromuscular re-education, 97535- Self Care, [id number]- Manual therapy, [id number]- Gait training, Patient/Family education, Balance training, and Stair training.  PLAN FOR NEXT SESSION: HEP review and update, manual techniques as appropriate, aerobic tasks, ROM and flexibility activities, strengthening and PREs, TPDN, gait and balance training as needed   Date of referral: 10/30/23 Referring provider: Anders Otto DASEN, MD Referring diagnosis? Unsteadiness on feet (R26.81);Pain Treatment diagnosis? (if different than referring diagnosis) M54.42,G89.29 (ICD-10-CM) - Chronic bilateral low back pain with left-sided sciatica  What was this (referring dx) caused by? Ongoing Issue  Lysle of Condition: Chronic (continuous duration > 3 months)   Laterality: Lt  Current Functional Measure Score: Other PSFS 7/30  Objective measurements identify impairments when they are compared to normal values, the uninvolved extremity, and prior level of function.  [x]  Yes  []  No  Objective assessment of functional ability: Moderate functional limitations   Briefly describe symptoms: 53 y.o. female with severe low back pain and left leg pain, worse with standing, walking, activity  How did symptoms start: chronic  Average pain intensity:  Last 24 hours: 8  Past week: 8  How often does the pt experience symptoms? Constantly  How much have the symptoms interfered with usual daily activities? Quite a bit  How has condition changed since care began at this facility? NA - initial visit  In general, how is the patients overall health? Good   BACK PAIN (STarT Back Screening Tool) Has pain spread down the leg(s) at some time in the last 2 weeks? y Has there been pain in the shoulder or  neck at some time in the last 2 weeks? n Has the pt only walked short distances because of back pain? y Has patient dressed more slowly because of back pain in the past 2 weeks? y Does patient think it's  not safe for a person with this condition to be physically active? y Does patient have worrying thoughts a lot of the time? y Does patient feel back pain is terrible and will never get any better? y Has patient stopped enjoying things they usually enjoy? y     Mabel Kiang, PT, DPT 12/15/2023, 12:07 PM

## 2023-12-17 ENCOUNTER — Telehealth (INDEPENDENT_AMBULATORY_CARE_PROVIDER_SITE_OTHER): Admitting: Psychiatry

## 2023-12-17 ENCOUNTER — Encounter (HOSPITAL_COMMUNITY): Payer: Self-pay | Admitting: Psychiatry

## 2023-12-17 ENCOUNTER — Ambulatory Visit

## 2023-12-17 DIAGNOSIS — F411 Generalized anxiety disorder: Secondary | ICD-10-CM

## 2023-12-17 DIAGNOSIS — F333 Major depressive disorder, recurrent, severe with psychotic symptoms: Secondary | ICD-10-CM

## 2023-12-17 MED ORDER — CARIPRAZINE HCL 1.5 MG PO CAPS: 1.5000 mg | ORAL_CAPSULE | Freq: Every day | ORAL | 3 refills | Status: DC

## 2023-12-17 MED ORDER — BENZTROPINE MESYLATE 0.5 MG PO TABS: 0.5000 mg | ORAL_TABLET | Freq: Two times a day (BID) | ORAL | 3 refills | Status: DC

## 2023-12-17 MED ORDER — HYDROXYZINE HCL 50 MG PO TABS: 50.0000 mg | ORAL_TABLET | Freq: Three times a day (TID) | ORAL | 3 refills | Status: DC | PRN

## 2023-12-17 MED ORDER — QUETIAPINE FUMARATE 150 MG PO TABS: 100.0000 mg | ORAL_TABLET | Freq: Every day | ORAL | 3 refills | Status: DC

## 2023-12-17 MED ORDER — MIRTAZAPINE 15 MG PO TABS: 15.0000 mg | ORAL_TABLET | Freq: Every day | ORAL | 3 refills | Status: DC

## 2023-12-17 MED ORDER — FLUOXETINE HCL 40 MG PO CAPS: 40.0000 mg | ORAL_CAPSULE | Freq: Every day | ORAL | 3 refills | Status: DC

## 2023-12-17 NOTE — Progress Notes (Signed)
 [facility] MD/PA/NP OP Progress Note Virtual Visit via Video Note  I connected with [name] on 12/17/23 at  2:30 PM EDT by a video enabled telemedicine application and verified that I am speaking with the correct person using two identifiers.  Location: Patient:Home Provider: Clinic   I discussed the limitations of evaluation and management by telemedicine and the availability of in person appointments. The patient expressed understanding and agreed to proceed.  I provided 30 minutes of non-face-to-face time during this encounter.       12/17/2023 6:33 PM [name]  MRN:  [id number]  Chief Complaint: I am falling a parts  HPI: 53 year old female seen today for follow up psychiatric evaluation. She has a psychiatric history of  OCD, adjustment disorder, anxiety, depression, SI, and bipolar disorder. She is currently being managed on  Prozac  40 mg daily, Seroquel  100 mg nightly, Gabapentin  300 mg three times daily as needed (prescribed by PCP), Ativan  1mg  BID (prescribed by PCP), Vistaril  50 mg three times daily, and mirtazapine  15 mg at bedtime. She informed Clinical research associate that her medications are effective in managing her psychiatric conditions.   Today patient had difficulty logging on so 50 percent of her visit was done on the phone. Patient tearful during the the exam. She had difficulty speaking. Provider instucted patient to take deep breaths as she was hyperventilating. She was able to calm down after redirection and complete the visit.  She informed Clinical research associate that she is falling apart. She notes that she hears the popping of a balloon. She notes that she sees frogs or bugs that are not there. She informed Clinical research associate that this has been going on for the last few weeks. Provider asked patient if she has a UTI. She notes that she does and is being treated with antibiotics. Provider informed patient that this may be the cause of her psychosis as her psychosis was sudden.   Patient request not to  do a GAD-7 or PHQ-9 as she was overwhelmed and crying throughout the exam.  She notes that physically she has not been able to stand for more than 30 minutes.  She continues to wait for approval for her disability as she has been out of work since April 2024.  Patient has mild paranoia she informed Clinical research associate that she was told by administrative staff that she needed to address appropriately when on telehealth visit.  She informed Clinical research associate that she thought it was documented that she was dressed inappropriately and that Clinical research associate would no longer see her.  Patient said she ruminates on this thoughts and had a panic attack.  Patient denies alcohol or illegal drug use.  Patient continues to have sciatica.  She quantifies her pain today as 8 out of 10.  She has been taking oxycodone  to help manage her pain.  Patient was taking Ativan  however was discontinued when she started taking oxycodone .  To help manage anxiety, depression, and sleep Seroquel  was increased to 150 mg.  Vraylar  1.5 mg was started as patient would like to trial this with Seroquel .   Provider discussed the risk and benefits of being on 2 antipsychotics.  Potential side effects of medication and risks vs benefits of treatment vs non-treatment were explained and discussed. All questions were answered.  No other concerns at this time.     Visit Diagnosis:    ICD-10-CM   1. Generalized anxiety disorder  F41.1 QUEtiapine  150 MG TABS    hydrOXYzine  (ATARAX ) 50 MG tablet    2.  Severe recurrent major depressive disorder with psychotic features (HCC)  F33.3 QUEtiapine  150 MG TABS              Past Psychiatric History: OCD, adjustment disorder, anxiety, depression, SI, and bipolar disorder.   Past Medical History:  Past Medical History:  Diagnosis Date   Acute renal failure 05/27/2010   hemodialysis for 6 weeks   Anxiety    Asthma    Depression    Diabetes mellitus    Hypertension    Rhabdomyolysis 06/06/2010   after drug overdose    Suicide attempt by drug ingestion (HCC) 06/05/2010   result rhabdomyolosis and ARF requrining dialysis    SVT (supraventricular tachycardia) 05/28/2021    Past Surgical History:  Procedure Laterality Date   ANKLE ARTHROSCOPY Right 08/17/2015   Procedure: RIGHT ANKLE ARTHROSCOPY WITH SYNOVECTOMY AND LOOSE BODY EXCISION;  Surgeon: Maude Herald, MD;  Location: [facility] OR;  Service: Orthopedics;  Laterality: Right;   CHOLECYSTECTOMY     TUBAL LIGATION  1998    Family Psychiatric History: Unknown  Family History:  Family History  Problem Relation Age of Onset   Asthma Father     Social History:  Social History   Socioeconomic History   Marital status: Married    Spouse name: Not on file   Number of children: 2   Years of education: Not on file   Highest education level: Not on file  Occupational History   Occupation: Research officer, trade union  Tobacco Use   Smoking status: Never    Passive exposure: Never   Smokeless tobacco: Never  Vaping Use   Vaping status: Never Used  Substance and Sexual Activity   Alcohol use: No   Drug use: No   Sexual activity: Yes    Comment: with husband   Other Topics Concern   Not on file  Social History Narrative   Married high school boyfriend at age 11, two boys, still married but he has been living with other women for years.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   Pt not working    Pt lives with family    Social Drivers of Corporate investment banker Strain: High Risk (06/19/2023)   Overall Financial Resource Strain (CARDIA)    Difficulty of Paying Living Expenses: Very hard  Food Insecurity: Food Insecurity Present (11/03/2023)   Hunger Vital Sign    Worried About Running Out of Food in the Last Year: Sometimes true    Ran Out of Food in the Last Year: Sometimes true  Transportation Needs: Unmet Transportation Needs (11/03/2023)   PRAPARE - Administrator, Civil Service (Medical): Yes     Lack of Transportation (Non-Medical): Yes  Physical Activity: Not on file  Stress: Not on file  Social Connections: Not on file    Allergies:  Allergies  Allergen Reactions   Ace Inhibitors Other (See Comments)    Patient had acute renal failure requiring hemodialysis after a suicide attempt.  Nephro recommended avoiding use.   Haldol  [Haloperidol  Lactate] Other (See Comments)    Tardive dyskinesia   Nsaids Other (See Comments)    Nephro recommended avoiding after acute renal failure requiring hemodialysis associated with a suicide attempt.   Entresto  [Sacubitril-Valsartan] Itching   Latex Other (See Comments)    Rash around IV site    Metabolic Disorder Labs: Lab Results  Component Value Date   HGBA1C 9.7 (H) 11/02/2023   MPG 231.69 11/02/2023  MPG 174.29 07/09/2022   No results found for: PROLACTIN Lab Results  Component Value Date   CHOL 196 12/04/2023   TRIG 179 (H) 12/04/2023   HDL 45 12/04/2023   CHOLHDL 4.4 12/04/2023   VLDL 46 (H) 04/10/2015   LDLCALC 119 (H) 12/04/2023   LDLCALC 156 (H) 08/10/2023   Lab Results  Component Value Date   TSH 1.060 12/04/2023   TSH 17.394 (H) 11/02/2023    Therapeutic Level Labs: No results found for: LITHIUM No results found for: VALPROATE No results found for: CBMZ  Current Medications: Current Outpatient Medications  Medication Sig Dispense Refill   cariprazine  (VRAYLAR ) 1.5 MG capsule Take 1 capsule (1.5 mg total) by mouth daily. 30 capsule 3   albuterol  (VENTOLIN  HFA) 108 (90 Base) MCG/ACT inhaler Inhale 2 puffs into the lungs every 6 (six) hours as needed for shortness of breath. 6.7 g 0   benztropine  (COGENTIN ) 0.5 MG tablet Take 1 tablet (0.5 mg total) by mouth 2 (two) times daily. 60 tablet 3   Black Cohosh 450 MG CAPS Take 450 mg by mouth at bedtime.     cephALEXin  (KEFLEX ) 500 MG capsule Take 1 capsule (500 mg total) by mouth 2 (two) times daily for 7 days. 14 capsule 0   clotrimazole  (GYNE-LOTRIMIN) 1 % vaginal cream Place 1 Applicatorful vaginally at bedtime for 7 days. 45 g 0   ferrous sulfate  325 (65 FE) MG tablet Take 1 tablet (325 mg total) by mouth daily with breakfast. 30 tablet 2   FLUoxetine  (PROZAC ) 40 MG capsule Take 1 capsule (40 mg total) by mouth daily. 30 capsule 3   fluticasone  (FLONASE ) 50 MCG/ACT nasal spray Place 1 spray into both nostrils daily. 1 spray in each nostril every day 16 g 1   fluticasone -salmeterol (WIXELA INHUB) 250-50 MCG/ACT AEPB Inhale 1 puff into the lungs in the morning and at bedtime. 60 each 1   furosemide  (LASIX ) 40 MG tablet Take 1 tablet (40 mg total) by mouth daily. 90 tablet 1   gabapentin  (NEURONTIN ) 300 MG capsule Take 2 capsules (600 mg total) by mouth 3 (three) times daily. 180 capsule 0   hydrOXYzine  (ATARAX ) 50 MG tablet Take 1 tablet (50 mg total) by mouth 3 (three) times daily as needed for anxiety. 90 tablet 3   isosorbide -hydrALAZINE  (BIDIL ) 20-37.5 MG tablet Take 1 tablet by mouth 3 (three) times daily. 180 tablet 2   LANTUS  SOLOSTAR 100 UNIT/ML Solostar Pen INJECT 65 UNITS SUBCUTANEOUSLY TWICE DAILY (Patient taking differently: Inject 65 Units into the skin 2 (two) times daily at 8 am and 10 pm.) 15 mL 5   levothyroxine  (SYNTHROID ) 300 MCG tablet Take 1 tablet (300 mcg total) by mouth daily at 6 (six) AM. 90 tablet 0   lidocaine  4 % Place 1 patch onto the skin daily.     Lidocaine -Menthol (CBD4 FREEZE PUMP VANISH SCENT) 4-1 % CREA Apply 1 Application topically daily as needed (pain).     losartan  (COZAAR ) 25 MG tablet Take 1 tablet (25 mg total) by mouth at bedtime. 30 tablet 1   methocarbamol  (ROBAXIN ) 500 MG tablet TAKE 2 TABLETS BY MOUTH THREE TIMES DAILY 180 tablet 0   metoprolol  succinate (TOPROL -XL) 50 MG 24 hr tablet Take 1 tablet (50 mg total) by mouth at bedtime. Take with or immediately following a meal. 90 tablet 3   mirtazapine  (REMERON ) 15 MG tablet Take 1 tablet (15 mg total) by mouth at bedtime. Decrease by  half in 2 weeks  and then discontinue.  Weight gaining medication. 30 tablet 3   NOVOLOG  FLEXPEN 100 UNIT/ML FlexPen INJECT 15 UNITS SUBCUTANEOUSLY WITH LUNCH , AND THEN INJECT 20 UNITS WITH SUPPER 15 mL 5   oxyCODONE -acetaminophen  (PERCOCET/ROXICET) 5-325 MG tablet Take 1 tablet by mouth every 6 (six) hours as needed for severe pain (pain score 7-10).     pantoprazole  (PROTONIX ) 40 MG tablet Take 1 tablet (40 mg total) by mouth daily. 90 tablet 1   QUEtiapine  150 MG TABS Take 100 mg by mouth at bedtime. 30 tablet 3   rosuvastatin  (CRESTOR ) 20 MG tablet Take 1 tablet (20 mg total) by mouth daily. 90 tablet 3   tirzepatide  (MOUNJARO ) 15 MG/0.5ML Pen Inject 15 mg into the skin once a week. 2 mL 3   Current Facility-Administered Medications  Medication Dose Route Frequency Provider Last Rate Last Admin   methylPREDNISolone  acetate (DEPO-MEDROL ) injection 80 mg  80 mg Intramuscular Once Sater, Charlie LABOR, MD         Musculoskeletal: Strength & Muscle Tone: within normal limits, telehealth visit Gait & Station: normal, telehealth visit Patient leans: N/A  Psychiatric Specialty Exam: Review of Systems  There were no vitals taken for this visit.There is no height or weight on file to calculate BMI.  General Appearance: Well Groomed  Eye Contact:  Good  Speech:  Clear and Coherent and Normal Rate  Volume:  Normal  Mood:  Anxious and Depressed,   Affect:  Depressed and Tearful  Thought Process:  Coherent, Goal Directed and Linear  Orientation:  Full (Time, Place, and Person)  Thought Content: WDL and Logical   Suicidal Thoughts:  No  Homicidal Thoughts:  No  Memory:  Immediate;   Good Recent;   Good Remote;   Good  Judgement:  Fair  Insight:  Fair  Psychomotor Activity:  Normal  Concentration:  Concentration: Good and Attention Span: Good  Recall:  Good  Fund of Knowledge: Good  Language: Good  Akathisia:  No  Handed:  Right  AIMS (if indicated): Not done  Assets:  Communication  Skills Desire for Improvement Financial Resources/Insurance Housing Social Support  ADL's:  Intact  Cognition: WNL  Sleep:  Good   Screenings: AIMS    Flowsheet Row Admission (Discharged) from 02/24/2022 in [facility] MEDICINE  AIMS Total Score 0   AUDIT    Flowsheet Row Admission (Discharged) from 02/24/2022 in [facility] INPATIENT BEH[facility]AL MEDICINE  Alcohol Use Disorder Identification Test Final Score (AUDIT) 0   GAD-7    Flowsheet Row Video Visit from 08/05/2023 in [facility] Video Visit from 05/21/2023 in [facility] Video Visit from 04/23/2023 in [facility] Video Visit from 02/20/2023 in [facility] Video Visit from 12/24/2022 in [facility]  Total GAD-7 Score 6 13 21 8 17    PHQ2-9    Flowsheet Row Office Visit from 12/04/2023 in [facility] [place] Video Visit from 08/05/2023 in [facility] Video Visit from 05/21/2023 in [facility] Video Visit from 04[facility]23/2023 in [facility] Office Visit from 03/12/2023 in [facility] [place]. [facility]  PHQ-2 Total Score 6 2 2 6 2   PHQ-9 Total Score 23 2 3 23 8    Flowsheet Row ED to Hosp-Admission (Discharged) from 11/01/2023 in [facility] 3E HF PCU ED from 07/07/2023 in [facility] Emergency Department at [facility] Video Visit from 05/21/2023 in [facility]  C-SSRS RISK CATEGORY No Risk No Risk High Risk     Assessment and Plan: Patient reports that her anxiety and depression has worsened. She has mild paranoia and VAH. She also notes that he has a UTI and being treated with antibiotics.   Patient was taking Ativan  however was discontinued when she started taking oxycodone .   To help manage anxiety, depression, and sleep Seroquel  was increased to 150 mg.  Vraylar  1.5 mg was started as patient would like to trial this with Seroquel .   Provider discussed the risk and benefits of being on 2 antipsychotics.    1. Generalized anxiety disorder  Continue- QUEtiapine  150 MG TABS; Take 100 mg by mouth at bedtime.  Dispense: 30 tablet; Refill: 3 Continue- mirtazapine  (REMERON ) 15 MG tablet; Take 1 tablet (15 mg total) by mouth at bedtime. Decrease by half in 2 weeks and then discontinue.  Weight gaining medication.  Dispense: 30 tablet; Refill: 3 Continue- hydrOXYzine  (ATARAX ) 50 MG tablet; Take 1 tablet (50 mg total) by mouth 3 (three) times daily as needed for anxiety.  Dispense: 90 tablet; Refill: 3  2. Severe recurrent major depressive disorder with psychotic features (HCC)  Increased- QUEtiapine  150 MG TABS; Take 100 mg by mouth at bedtime.  Dispense: 30 tablet; Refill: 3 Continue- mirtazapine  (REMERON ) 15 MG tablet; Take 1 tablet (15 mg total) by mouth at bedtime. Decrease by half in 2 weeks and then discontinue.  Weight gaining medication.  Dispense: 30 tablet; Refill: 3 Continue- benztropine  (COGENTIN ) 0.5 MG tablet; Take 1 tablet (0.5 mg total) by mouth 2 (two) times daily.  Dispense: 60 tablet; Refill: 3 Start- cariprazine  (VRAYLAR ) 1.5 MG capsule; Take 1 capsule (1.5 mg total) by mouth daily.  Dispense: 30 capsule; Refill: 3 Continue- FLUoxetine  (PROZAC ) 40 MG capsule; Take 1 capsule (40 mg total) by mouth daily.  Dispense: 30 capsule; Refill: 3     Follow-up in 2.5 months  Zane FORBES Bach, NP 12/17/2023, 6:33 PM

## 2023-12-18 ENCOUNTER — Telehealth (HOSPITAL_COMMUNITY): Payer: Self-pay

## 2023-12-18 NOTE — Telephone Encounter (Signed)
 went online to [email or web address] and submitted the prior auth . - pending

## 2023-12-18 NOTE — Telephone Encounter (Signed)
 received fax that prior auth was approved from 12-18-23 to 12-18-26

## 2023-12-18 NOTE — Telephone Encounter (Signed)
received fax that a prior auth was needed for the vraylar 1.5mg 

## 2023-12-23 ENCOUNTER — Telehealth (HOSPITAL_COMMUNITY): Payer: Self-pay

## 2023-12-23 NOTE — Telephone Encounter (Signed)
 Patient called in requesting a cheaper alternative to Vraylar  1.5mg . Per Patient she doesn't have the money for the co pay.

## 2023-12-24 NOTE — Telephone Encounter (Signed)
 Nursing staff called pharmacy and use Vraylars coupon saving code.  Patient is able to pick Vraylar  up at her pharmacy without a co-pay and will receive the medication for $0.  Provider attempted to call patient to notify her but got her voicemail.  Voicemail left detailing the above.  Patient instructed to call clinic with further questions or concerns.

## 2023-12-29 ENCOUNTER — Ambulatory Visit: Payer: Self-pay

## 2023-12-29 DIAGNOSIS — M5459 Other low back pain: Secondary | ICD-10-CM

## 2023-12-29 DIAGNOSIS — M6281 Muscle weakness (generalized): Secondary | ICD-10-CM

## 2023-12-29 DIAGNOSIS — M5442 Lumbago with sciatica, left side: Secondary | ICD-10-CM

## 2023-12-29 NOTE — Therapy (Signed)
 OUTPATIENT PHYSICAL THERAPY TREATMENT NOTE   Patient Name: [name] MRN: [id number] DOB:[birth year], 53 y.o., female Today's Date: 12/29/2023  END OF SESSION:  PT End of Session - 12/29/23 1655     Visit Number 5    Number of Visits 8    Date for Recertification  01/18/24    Authorization Type [employer]    PT Start Time 1700    PT Stop Time 1738    PT Time Calculation (min) 38 min    Activity Tolerance Patient tolerated treatment well    Behavior During Therapy [facility] for tasks assessed/performed             Past Medical History:  Diagnosis Date   Acute renal failure 05/27/2010   hemodialysis for 6 weeks   Anxiety    Asthma    Depression    Diabetes mellitus    Hypertension    Rhabdomyolysis 06/06/2010   after drug overdose   Suicide attempt by drug ingestion (HCC) 06/05/2010   result rhabdomyolosis and ARF requrining dialysis    SVT (supraventricular tachycardia) 05/28/2021   Past Surgical History:  Procedure Laterality Date   ANKLE ARTHROSCOPY Right 08/17/2015   Procedure: RIGHT ANKLE ARTHROSCOPY WITH SYNOVECTOMY AND LOOSE BODY EXCISION;  Surgeon: Maude Herald, MD;  Location: [facility] OR;  Service: Orthopedics;  Laterality: Right;   CHOLECYSTECTOMY     TUBAL LIGATION  1998   Patient Active Problem List   Diagnosis Date Noted   Neuropathic pain 11/03/2023   Sciatica of left side 11/03/2023   Chronic health problem 11/02/2023   Hypomagnesemia 11/02/2023   SOB (shortness of breath) 11/02/2023   Weakness of both lower limbs 11/02/2023   Acute on chronic congestive heart failure (HCC) 11/01/2023   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   Chronic low back pain with left-sided sciatica 07/08/2022   Chronic systolic CHF (congestive heart failure) (HCC) - LVEF 35-40% by echo 03-2023    Bilateral knee pain 03/14/2019   HLD (hyperlipidemia) 03/19/2017   Asthma 03/14/2017   Major depressive disorder, recurrent severe without psychotic features (HCC) 05/11/2015   Vitamin D   deficiency 04/11/2015   CKD stage 3b, GFR 30-44 ml/min (HCC) - baseline Scr 1.7-2.1 11/06/2010   Essential hypertension, benign 02/03/2007   Hypothyroidism 05/07/2006   T2DM (type 2 diabetes mellitus) (HCC) 05/07/2006   Obesity, Class III, BMI 40-49.9 (morbid obesity) (HCC) 05/07/2006   Iron deficiency anemia 05/07/2006   Obsessive-compulsive disorder 05/07/2006   Generalized anxiety disorder 05/07/2006    PCP: Theophilus Pagan, MD   REFERRING PROVIDER: Anders Otto DASEN, MD  REFERRING DIAG: Unsteadiness on feet (R26.81);Pain  Rationale for Evaluation and Treatment: Rehabilitation  THERAPY DIAG:  Other low back pain  Low back pain with left-sided sciatica, unspecified back pain laterality, unspecified chronicity  Muscle weakness (generalized)  ONSET DATE: 16 months  SUBJECTIVE:  SUBJECTIVE STATEMENT:  Arrives to OPPT with 9/10 pain levels but does not appear in distress.  Pain levels do not drop below 8/10.      PERTINENT HISTORY:  53 y.o. female with severe low back pain and left leg pain, worse with standing, walking, activity. We reviewed her imaging noting a widely patent central canal with degenerative changes of facet joints, large effusions/facet hypertrophy. She also has significantly atrophied musculature of lumbar sacral region. We had a long discussion regarding arthritis and weakened muscles and their role in lon back pain. We discussed the role of conservative cares in treating back pain. This includes activity modification, physical therapy, medications including NSAIDs for flares, and injections. We discussed the relapsing nature of back pain. We discussed a spine-healthy lifestyle including a healthy diet, weight control, staying active, and maintaining core strength, stability, and  lower extremity flexibility. We discussed that there is generally not a good surgical treatment for axial back pain unless it is associated with a mechanical/structural abnormality, radicular leg pain, weakness, or other neurologic signs/symptoms. We discussed physical therapy to work on balance as well as core strengthening for low back pain. I am providing exercises today.  I also mentioned role of facet injections, peripheral nerve stimulator and multifidus stimulator and how each may benefit her. She will follow up with Dr Waylon to discuss these options in greater detail.  As she is not a surgical candidate, she does not need a follow up appointment.   PAIN:  Are you having pain? Yes: NPRS scale: 10/10 Pain location: low back, LLE Pain description: ache, radiating Aggravating factors: activity Relieving factors: medication  PRECAUTIONS: None  RED FLAGS: None   WEIGHT BEARING RESTRICTIONS: No  FALLS:  Has patient fallen in last 6 months? Yes. Number of falls 1   OCCUPATION: not working  PLOF: Independent  PATIENT GOALS: To walk independently again  NEXT MD VISIT: TBD  OBJECTIVE:  Note: Objective measures were completed at Evaluation unless otherwise noted.  DIAGNOSTIC FINDINGS:  Multilevel degenerate age, greatest at at L4-L5 where there are  bilateral facet joint effusions with perifacet inflammatory  change/arthropathy and mild bilateral foraminal stenosis.   PATIENT SURVEYS:  Patient-specific activity scoring scheme (Point to one number):  0 represents "unable to perform." 10 represents "able to perform at prior level. 0 1 2 3 4 5 6 7 8 9  10 (Date and Score) Activity Initial  Activity Eval     Standing >30s   2    Walking 5 min  0    Standing from sitting 5     Total score = sum of the activity scores/number of activities Minimum detectable change (90%CI) for average score = 2 points Minimum detectable change (90%CI) for single activity score = 3  points PSFS developed by: Rosalee MYRTIS Marvis KYM Charlet CHRISTELLA., & Binkley, J. (1995). Assessing disability and change on individual  patients: a report of a patient specific measure. Physiotherapy [place], 47, [phone]. Reproduced with the permission of the authors  Score: 7/30 23% perceived function   MUSCLE LENGTH: Hamstrings: Right 90 deg; Left 90 deg Thomas test: TBD  POSTURE: UTA to excessive WS to L  PALPATION: deferred  LUMBAR ROM:   AROM eval  Flexion NT  Extension NT  Right lateral flexion NT  Left lateral flexion NT  Right rotation 75%  Left rotation 75%   (Blank rows = not tested)  LOWER EXTREMITY ROM:     Active  Right eval Left eval  Hip  flexion    Hip extension    Hip abduction    Hip adduction    Hip internal rotation 0d 0d  Hip external rotation    Knee flexion    Knee extension    Ankle dorsiflexion    Ankle plantarflexion    Ankle inversion    Ankle eversion     (Blank rows = not tested)  LOWER EXTREMITY MMT:    MMT Right eval Left eval  Hip flexion    Hip extension    Hip abduction    Hip adduction    Hip internal rotation    Hip external rotation    Knee flexion    Knee extension    Ankle dorsiflexion    Ankle plantarflexion    Ankle inversion    Ankle eversion     (Blank rows = not tested)  LUMBAR SPECIAL TESTS:  Straight leg raise test: Negative and Slump test: Positive  FUNCTIONAL TESTS:  30 seconds chair stand test 0 reps  GAIT: Distance walked: 45ftx2 Assistive device utilized: Quad cane small base Level of assistance: Complete Independence Comments: slow cadence , WS L  TREATMENT:      [facility] Adult PT Treatment:                                                DATE: 12/29/23 Therapeutic Exercise: Nustep L4 8 min Neuromuscular re-ed: Supine hip fallouts GTB 15x B, 15/15  S/L clams GTB 15/15 Bridge with GTB 15x Therapeutic Activity: Seated hamstring stretch 30s x2 B Supine QL stretch 30s x2 B (pain!) Supine  p-ball curl ups 15x B 15/15  [facility] Adult PT Treatment:                                                DATE: 12/10/2023 Therapeutic Exercise: NuStep 8' Seated hamstring stretch 2x1' B  Neuromuscular re-ed: Supine bridge 3x8, hold 3s Supine LTR on Pball 2x12 Supine pball roll up 2x12, hold 1s    [facility] Adult PT Treatment:                                                DATE: 12/03/2023  Therapeutic Exercise: Nustep 6' Supine QL stretch 2x30s Neuromuscular re-ed: Seated pball press 2x15, hold 4s Supine LTR 2x12 Supine marching 2x12B, hold 1s Cue for core bracing STS  2x8  Pt requires additional time for exercise      [facility] Adult PT Treatment:                                                DATE: 11/24/2023 Therapeutic Exercise: Seated hamstring stretch 2x1' B Pball rollout Ts 2x12 Supine piriformis push/pull 2x1'B Therapeutic Activity: NuStep 8' for activity tolerance Supine marching 2x10B  [facility] Adult PT Treatment:                                                DATE: 11/18/23 Eval and HEP Self Care:  Additional minutes spent for educating on updated Therapeutic Home Exercise Program as well as comparing current status to condition at start of symptoms. This included exercises focusing on stretching, strengthening, with focus on eccentric aspects. Long term goals include an improvement in range of motion, strength, endurance as well as avoiding reinjury. Patient's frequency would include in 1-2 times a day, 3-5 times a week for a duration of 6-12 weeks. Proper technique shown and discussed handout in great detail. All questions were discussed and addressed.        PATIENT EDUCATION:  Education details: Discussed eval findings, rehab rationale and POC and patient is in agreement  Person educated: Patient Education method: Explanation and  Handouts Education comprehension: verbalized understanding and needs further education  HOME EXERCISE PROGRAM: Access Code: [email or web address] URL: [email or web address] Date: 12/10/2023 Prepared by: Mabel Kiang  Exercises - Seated Hamstring Stretch  - 1 x daily - 7 x weekly - 2 sets - 1 reps - 36m hold - Sit to Stand with Armchair  - 2 x daily - 5 x weekly - 1 sets - 5 reps - Supine Lower Trunk Rotation  - 1 x daily - 7 x weekly - 3 sets - 10 reps - Supine Bridge  - 1 x daily - 4 x weekly - 2-3 sets - 12 reps - 4s hold  ASSESSMENT:  CLINICAL IMPRESSION: patient returns to [facility] following 3 week absence due to transportation issues.  Session focused on stretching with increased discomfort noted with rotational stretches.  Advanced to resisted lumbosacral strengthening and finished with aerobic work.   Patient is a 53 y.o. female who was seen today for physical therapy evaluation and treatment for chronic low back and L radicular symptoms. Patient unable to stand safely to assess trunk ROM as standing tolerance is less than 30s.  Marked mobility restrictions in B hip IR but good hamstring flexibility observed.  Patient understands rationale for rehab and is agreeable to POC   OBJECTIVE IMPAIRMENTS: Abnormal gait, decreased activity tolerance, decreased balance, decreased endurance, decreased knowledge of condition, decreased mobility, difficulty walking, decreased strength, improper body mechanics, postural dysfunction, obesity, and pain.   ACTIVITY LIMITATIONS: carrying, lifting, bending, sitting, standing, squatting, and stairs  PERSONAL FACTORS: Behavior pattern, Fitness, Past/current experiences, and Time since onset of injury/illness/exacerbation are also affecting patient's functional outcome.   REHAB POTENTIAL: Fair based on previous unsuccessful OPPT episodes, intractable pain and lack of definitive causation of symptoms  CLINICAL DECISION MAKING: Evolving/moderate  complexity  EVALUATION COMPLEXITY: Moderate   GOALS: Goals reviewed with patient? No  SHORT TERM GOALS: Target date: 12/02/2023    Patient to demonstrate independence in HEP  Baseline: [id number] Goal status: INITIAL  2.  Able to stand from sitting with single UE support. Baseline: 0 reps Goal status: INITIAL   LONG TERM GOALS: Target date: 12/16/2023  Patient will acknowledge 6/10 pain at least once during episode of care   Baseline: 10/10 Goal status: INITIAL  2.  Patient will score at least 13/30 on PSFS to signify clinically meaningful improvement in functional abilities.   Baseline: 7/30 Goal status: INITIAL  3.  Patient will increase 30s chair stand reps  from 0 to 2 with/without arms to demonstrate and improved functional ability with less pain/difficulty as well as reduce fall risk.  Baseline: 0 Goal status: INITIAL  4.  Patient to ambulate 27ft across level ground with LRAD Baseline: 29ft with SBQC Goal status: INITIAL    PLAN:  PT FREQUENCY: 2x/week  PT DURATION: 4 weeks  PLANNED INTERVENTIONS: [id number]-Therapeutic exercises, 97530- Therapeutic activity, [id number]- Neuromuscular re-education, 97535- Self Care, [id number]- Manual therapy, [phone]- Gait training, Patient/Family education, Balance training, and Stair training.  PLAN FOR NEXT SESSION: HEP review and update, manual techniques as appropriate, aerobic tasks, ROM and flexibility activities, strengthening and PREs, TPDN, gait and balance training as needed   Date of referral: 10/30/23 Referring provider: Anders Otto DASEN, MD Referring diagnosis? Unsteadiness on feet (R26.81);Pain Treatment diagnosis? (if different than referring diagnosis) M54.42,G89.29 (ICD-10-CM) - Chronic bilateral low back pain with left-sided sciatica  What was this (referring dx) caused by? Ongoing Issue  Lysle of Condition: Chronic (continuous duration > 3 months)   Laterality: Lt  Current Functional Measure Score: Other PSFS  7/30  Objective measurements identify impairments when they are compared to normal values, the uninvolved extremity, and prior level of function.  [x]  Yes  []  No  Objective assessment of functional ability: Moderate functional limitations   Briefly describe symptoms: 53 y.o. female with severe low back pain and left leg pain, worse with standing, walking, activity  How did symptoms start: chronic  Average pain intensity:  Last 24 hours: 8  Past week: 8  How often does the pt experience symptoms? Constantly  How much have the symptoms interfered with usual daily activities? Quite a bit  How has condition changed since care began at this facility? NA - initial visit  In general, how is the patients overall health? Good   BACK PAIN (STarT Back Screening Tool) Has pain spread down the leg(s) at some time in the last 2 weeks? y Has there been pain in the shoulder or neck at some time in the last 2 weeks? n Has the pt only walked short distances because of back pain? y Has patient dressed more slowly because of back pain in the past 2 weeks? y Does patient think it's not safe for a person with this condition to be physically active? y Does patient have worrying thoughts a lot of the time? y Does patient feel back pain is terrible and will never get any better? y Has patient stopped enjoying things they usually enjoy? y     Mabel Kiang, PT, DPT 12/29/2023, 5:43 PM

## 2023-12-30 NOTE — Therapy (Deleted)
 OUTPATIENT PHYSICAL THERAPY TREATMENT NOTE   Patient Name: [name] MRN: [id number] DOB:[birth year], 53 y.o., female Today's Date: 12/30/2023  END OF SESSION:       Past Medical History:  Diagnosis Date   Acute renal failure 05/27/2010   hemodialysis for 6 weeks   Anxiety    Asthma    Depression    Diabetes mellitus    Hypertension    Rhabdomyolysis 06/06/2010   after drug overdose   Suicide attempt by drug ingestion (HCC) 06/05/2010   result rhabdomyolosis and ARF requrining dialysis    SVT (supraventricular tachycardia) 05/28/2021   Past Surgical History:  Procedure Laterality Date   ANKLE ARTHROSCOPY Right 08/17/2015   Procedure: RIGHT ANKLE ARTHROSCOPY WITH SYNOVECTOMY AND LOOSE BODY EXCISION;  Surgeon: Maude Herald, MD;  Location: [facility] OR;  Service: Orthopedics;  Laterality: Right;   CHOLECYSTECTOMY     TUBAL LIGATION  1998   Patient Active Problem List   Diagnosis Date Noted   Neuropathic pain 11/03/2023   Sciatica of left side 11/03/2023   Chronic health problem 11/02/2023   Hypomagnesemia 11/02/2023   SOB (shortness of breath) 11/02/2023   Weakness of both lower limbs 11/02/2023   Acute on chronic congestive heart failure (HCC) 11/01/2023   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   Chronic low back pain with left-sided sciatica 07/08/2022   Chronic systolic CHF (congestive heart failure) (HCC) - LVEF 35-40% by echo 03-2023    Bilateral knee pain 03/14/2019   HLD (hyperlipidemia) 03/19/2017   Asthma 03/14/2017   Major depressive disorder, recurrent severe without psychotic features (HCC) 05/11/2015   Vitamin D  deficiency 04/11/2015   CKD stage 3b, GFR 30-44 ml/min (HCC) - baseline Scr 1.7-2.1 11/06/2010   Essential hypertension, benign 02/03/2007   Hypothyroidism 05/07/2006   T2DM (type 2 diabetes mellitus) (HCC) 05/07/2006   Obesity, Class III, BMI 40-49.9 (morbid obesity) (HCC) 05/07/2006   Iron deficiency anemia 05/07/2006   Obsessive-compulsive disorder  05/07/2006   Generalized anxiety disorder 05/07/2006    PCP: Theophilus Pagan, MD   REFERRING PROVIDER: Anders Otto DASEN, MD  REFERRING DIAG: Unsteadiness on feet (R26.81);Pain  Rationale for Evaluation and Treatment: Rehabilitation  THERAPY DIAG:  No diagnosis found.  ONSET DATE: 16 months  SUBJECTIVE:                                                                                                                                                                                           SUBJECTIVE STATEMENT:  Arrives to OPPT with 9/10 pain levels but does not appear in distress.  Pain levels do not drop below 8/10.      PERTINENT HISTORY:  53 y.o. female with severe low back pain and left leg pain, worse with standing, walking, activity. We reviewed her imaging noting a widely patent central canal with degenerative changes of facet joints, large effusions/facet hypertrophy. She also has significantly atrophied musculature of lumbar sacral region. We had a long discussion regarding arthritis and weakened muscles and their role in lon back pain. We discussed the role of conservative cares in treating back pain. This includes activity modification, physical therapy, medications including NSAIDs for flares, and injections. We discussed the relapsing nature of back pain. We discussed a spine-healthy lifestyle including a healthy diet, weight control, staying active, and maintaining core strength, stability, and lower extremity flexibility. We discussed that there is generally not a good surgical treatment for axial back pain unless it is associated with a mechanical/structural abnormality, radicular leg pain, weakness, or other neurologic signs/symptoms. We discussed physical therapy to work on balance as well as core strengthening for low back pain. I am providing exercises today.  I also mentioned role of facet injections, peripheral nerve stimulator and multifidus stimulator and how each may  benefit her. She will follow up with Dr Waylon to discuss these options in greater detail.  As she is not a surgical candidate, she does not need a follow up appointment.   PAIN:  Are you having pain? Yes: NPRS scale: 10/10 Pain location: low back, LLE Pain description: ache, radiating Aggravating factors: activity Relieving factors: medication  PRECAUTIONS: None  RED FLAGS: None   WEIGHT BEARING RESTRICTIONS: No  FALLS:  Has patient fallen in last 6 months? Yes. Number of falls 1   OCCUPATION: not working  PLOF: Independent  PATIENT GOALS: To walk independently again  NEXT MD VISIT: TBD  OBJECTIVE:  Note: Objective measures were completed at Evaluation unless otherwise noted.  DIAGNOSTIC FINDINGS:  Multilevel degenerate age, greatest at at L4-L5 where there are  bilateral facet joint effusions with perifacet inflammatory  change/arthropathy and mild bilateral foraminal stenosis.   PATIENT SURVEYS:  Patient-specific activity scoring scheme (Point to one number):  0 represents "unable to perform." 10 represents "able to perform at prior level. 0 1 2 3 4 5 6 7 8 9  10 (Date and Score) Activity Initial  Activity Eval     Standing >30s   2    Walking 5 min  0    Standing from sitting 5     Total score = sum of the activity scores/number of activities Minimum detectable change (90%CI) for average score = 2 points Minimum detectable change (90%CI) for single activity score = 3 points PSFS developed by: Rosalee MYRTIS Marvis KYM Charlet CHRISTELLA., & Binkley, J. (1995). Assessing disability and change on individual  patients: a report of a patient specific measure. Physiotherapy [place], 47, [phone]. Reproduced with the permission of the authors  Score: 7/30 23% perceived function   MUSCLE LENGTH: Hamstrings: Right 90 deg; Left 90 deg Thomas test: TBD  POSTURE: UTA to excessive WS to L  PALPATION: deferred  LUMBAR ROM:   AROM eval  Flexion NT  Extension  NT  Right lateral flexion NT  Left lateral flexion NT  Right rotation 75%  Left rotation 75%   (Blank rows = not tested)  LOWER EXTREMITY ROM:     Active  Right eval Left eval  Hip flexion    Hip extension    Hip abduction    Hip adduction    Hip internal rotation 0d 0d  Hip external rotation    Knee  flexion    Knee extension    Ankle dorsiflexion    Ankle plantarflexion    Ankle inversion    Ankle eversion     (Blank rows = not tested)  LOWER EXTREMITY MMT:    MMT Right eval Left eval  Hip flexion    Hip extension    Hip abduction    Hip adduction    Hip internal rotation    Hip external rotation    Knee flexion    Knee extension    Ankle dorsiflexion    Ankle plantarflexion    Ankle inversion    Ankle eversion     (Blank rows = not tested)  LUMBAR SPECIAL TESTS:  Straight leg raise test: Negative and Slump test: Positive  FUNCTIONAL TESTS:  30 seconds chair stand test 0 reps  GAIT: Distance walked: 50ftx2 Assistive device utilized: Quad cane small base Level of assistance: Complete Independence Comments: slow cadence , WS L  TREATMENT:      [facility] Adult PT Treatment:                                                DATE: 12/29/23 Therapeutic Exercise: Nustep L4 8 min Neuromuscular re-ed: Supine hip fallouts GTB 15x B, 15/15  S/L clams GTB 15/15 Bridge with GTB 15x Therapeutic Activity: Seated hamstring stretch 30s x2 B Supine QL stretch 30s x2 B (pain!) Supine p-ball curl ups 15x B 15/15  [facility] Adult PT Treatment:                                                DATE: 12/10/2023 Therapeutic Exercise: NuStep 8' Seated hamstring stretch 2x1' B  Neuromuscular re-ed: Supine bridge 3x8, hold 3s Supine LTR on Pball 2x12 Supine pball roll up 2x12, hold 1s    [facility] Adult PT Treatment:                                                DATE: 12/03/2023  Therapeutic Exercise: Nustep 6' Supine QL stretch 2x30s Neuromuscular re-ed: Seated pball  press 2x15, hold 4s Supine LTR 2x12 Supine marching 2x12B, hold 1s Cue for core bracing STS  2x8  Pt requires additional time for exercise      [facility] Adult PT Treatment:                                                DATE: 11/24/2023 Therapeutic Exercise: Seated hamstring stretch 2x1' B Pball rollout Ts 2x12 Supine piriformis push/pull 2x1'B Therapeutic Activity: NuStep 8' for activity tolerance Supine marching 2x10B  [facility] Adult PT Treatment:                                                DATE: 11/18/23 Eval and HEP Self Care:  Additional minutes spent for educating on updated Therapeutic Home Exercise Program as well as comparing current status to condition at start of symptoms. This included exercises focusing on stretching, strengthening, with focus on eccentric aspects. Long term goals include an improvement in range of motion, strength, endurance as well as avoiding reinjury. Patient's frequency would include in 1-2 times a day, 3-5 times a week for a duration of 6-12 weeks. Proper technique shown and discussed handout in great detail. All questions were discussed and addressed.        PATIENT EDUCATION:  Education details: Discussed eval findings, rehab rationale and POC and patient is in agreement  Person educated: Patient Education method: Explanation and Handouts Education comprehension: verbalized understanding and needs further education  HOME EXERCISE PROGRAM: Access Code: [email or web address] URL: [email or web address] Date: 12/10/2023 Prepared by: Mabel Kiang  Exercises - Seated Hamstring Stretch  - 1 x daily - 7 x weekly - 2 sets - 1 reps - 81m hold - Sit to Stand with Armchair  - 2 x daily - 5 x weekly - 1 sets - 5 reps - Supine Lower Trunk Rotation  - 1 x daily - 7 x weekly - 3 sets - 10 reps - Supine Bridge  - 1 x daily - 4 x  weekly - 2-3 sets - 12 reps - 4s hold  ASSESSMENT:  CLINICAL IMPRESSION: patient returns to [facility] following 3 week absence due to transportation issues.  Session focused on stretching with increased discomfort noted with rotational stretches.  Advanced to resisted lumbosacral strengthening and finished with aerobic work.   Patient is a 53 y.o. female who was seen today for physical therapy evaluation and treatment for chronic low back and L radicular symptoms. Patient unable to stand safely to assess trunk ROM as standing tolerance is less than 30s.  Marked mobility restrictions in B hip IR but good hamstring flexibility observed.  Patient understands rationale for rehab and is agreeable to POC   OBJECTIVE IMPAIRMENTS: Abnormal gait, decreased activity tolerance, decreased balance, decreased endurance, decreased knowledge of condition, decreased mobility, difficulty walking, decreased strength, improper body mechanics, postural dysfunction, obesity, and pain.   ACTIVITY LIMITATIONS: carrying, lifting, bending, sitting, standing, squatting, and stairs  PERSONAL FACTORS: Behavior pattern, Fitness, Past/current experiences, and Time since onset of injury/illness/exacerbation are also affecting patient's functional outcome.   REHAB POTENTIAL: Fair based on previous unsuccessful OPPT episodes, intractable pain and lack of definitive causation of symptoms  CLINICAL DECISION MAKING: Evolving/moderate complexity  EVALUATION COMPLEXITY: Moderate   GOALS: Goals reviewed with patient? No  SHORT TERM GOALS: Target date: 12/02/2023    Patient to demonstrate independence in HEP  Baseline: [id number] Goal status: INITIAL  2.  Able to stand from sitting with single UE support. Baseline: 0 reps Goal status: INITIAL   LONG TERM GOALS: Target date: 12/16/2023  Patient will acknowledge 6/10 pain at least once during episode of care   Baseline: 10/10 Goal status: INITIAL  2.  Patient will score at  least 13/30 on PSFS to signify clinically meaningful improvement in functional abilities.   Baseline: 7/30 Goal status: INITIAL  3.  Patient will increase 30s chair stand reps  from 0 to 2 with/without arms to demonstrate and improved functional ability with less pain/difficulty as well as reduce fall risk.  Baseline: 0 Goal status: INITIAL  4.  Patient to ambulate 238ft across level ground with LRAD Baseline: 66ft with SBQC Goal status: INITIAL    PLAN:  PT FREQUENCY: 2x/week  PT DURATION: 4 weeks  PLANNED INTERVENTIONS: [id number]-Therapeutic exercises, 97530- Therapeutic activity, [id number]- Neuromuscular re-education, 97535- Self Care, [id number]- Manual therapy, [phone]- Gait training, Patient/Family education, Balance training, and Stair training.  PLAN FOR NEXT SESSION: HEP review and update, manual techniques as appropriate, aerobic tasks, ROM and flexibility activities, strengthening and PREs, TPDN, gait and balance training as needed   Date of referral: 10/30/23 Referring provider: Anders Otto DASEN, MD Referring diagnosis? Unsteadiness on feet (R26.81);Pain Treatment diagnosis? (if different than referring diagnosis) M54.42,G89.29 (ICD-10-CM) - Chronic bilateral low back pain with left-sided sciatica  What was this (referring dx) caused by? Ongoing Issue  Lysle of Condition: Chronic (continuous duration > 3 months)   Laterality: Lt  Current Functional Measure Score: Other PSFS 7/30  Objective measurements identify impairments when they are compared to normal values, the uninvolved extremity, and prior level of function.  [x]  Yes  []  No  Objective assessment of functional ability: Moderate functional limitations   Briefly describe symptoms: 53 y.o. female with severe low back pain and left leg pain, worse with standing, walking, activity  How did symptoms start: chronic  Average pain intensity:  Last 24 hours: 8  Past week: 8  How often does the pt experience symptoms?  Constantly  How much have the symptoms interfered with usual daily activities? Quite a bit  How has condition changed since care began at this facility? NA - initial visit  In general, how is the patients overall health? Good   BACK PAIN (STarT Back Screening Tool) Has pain spread down the leg(s) at some time in the last 2 weeks? y Has there been pain in the shoulder or neck at some time in the last 2 weeks? n Has the pt only walked short distances because of back pain? y Has patient dressed more slowly because of back pain in the past 2 weeks? y Does patient think it's not safe for a person with this condition to be physically active? y Does patient have worrying thoughts a lot of the time? y Does patient feel back pain is terrible and will never get any better? y Has patient stopped enjoying things they usually enjoy? y     Mabel Kiang, PT, DPT 12/30/2023, 2:54 PM

## 2023-12-31 ENCOUNTER — Ambulatory Visit

## 2024-01-01 ENCOUNTER — Other Ambulatory Visit: Payer: Self-pay | Admitting: Family Medicine

## 2024-01-01 DIAGNOSIS — G8929 Other chronic pain: Secondary | ICD-10-CM

## 2024-01-01 DIAGNOSIS — J45909 Unspecified asthma, uncomplicated: Secondary | ICD-10-CM

## 2024-01-05 ENCOUNTER — Ambulatory Visit: Admitting: Physical Therapy

## 2024-01-05 ENCOUNTER — Telehealth: Payer: Self-pay

## 2024-01-05 DIAGNOSIS — M5459 Other low back pain: Secondary | ICD-10-CM | POA: Diagnosis not present

## 2024-01-05 DIAGNOSIS — M6281 Muscle weakness (generalized): Secondary | ICD-10-CM

## 2024-01-05 DIAGNOSIS — M5442 Lumbago with sciatica, left side: Secondary | ICD-10-CM

## 2024-01-05 DIAGNOSIS — R2689 Other abnormalities of gait and mobility: Secondary | ICD-10-CM

## 2024-01-05 NOTE — Therapy (Signed)
 OUTPATIENT PHYSICAL THERAPY TREATMENT NOTE   Patient Name: [name] MRN: [id number] DOB:09/27/1970, 53 y.o., female Today's Date: 01/05/2024  END OF SESSION:  PT End of Session - 01/05/24 0806     Visit Number 6    Number of Visits 14    Date for Recertification  02/02/24    Authorization Type [employer]    PT Start Time 0803    PT Stop Time [phone]    PT Time Calculation (min) 39 min    Activity Tolerance Patient tolerated treatment well    Behavior During Therapy [facility] for tasks assessed/performed              Past Medical History:  Diagnosis Date   Acute renal failure 05/27/2010   hemodialysis for 6 weeks   Anxiety    Asthma    Depression    Diabetes mellitus    Hypertension    Rhabdomyolysis 06/06/2010   after drug overdose   Suicide attempt by drug ingestion (HCC) 06/05/2010   result rhabdomyolosis and ARF requrining dialysis    SVT (supraventricular tachycardia) 05/28/2021   Past Surgical History:  Procedure Laterality Date   ANKLE ARTHROSCOPY Right 08/17/2015   Procedure: RIGHT ANKLE ARTHROSCOPY WITH SYNOVECTOMY AND LOOSE BODY EXCISION;  Surgeon: Maude Herald, MD;  Location: [facility] OR;  Service: Orthopedics;  Laterality: Right;   CHOLECYSTECTOMY     TUBAL LIGATION  1998   Patient Active Problem List   Diagnosis Date Noted   Neuropathic pain 11/03/2023   Sciatica of left side 11/03/2023   Chronic health problem 11/02/2023   Hypomagnesemia 11/02/2023   SOB (shortness of breath) 11/02/2023   Weakness of both lower limbs 11/02/2023   Acute on chronic congestive heart failure (HCC) 11/01/2023   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   Chronic low back pain with left-sided sciatica 07/08/2022   Chronic systolic CHF (congestive heart failure) (HCC) - LVEF 35-40% by echo 03-2023    Bilateral knee pain 03/14/2019   HLD (hyperlipidemia) 03/19/2017   Asthma 03/14/2017   Major depressive disorder, recurrent severe without psychotic features (HCC) 05/11/2015   Vitamin D   deficiency 04/11/2015   CKD stage 3b, GFR 30-44 ml/min (HCC) - baseline Scr 1.7-2.1 11/06/2010   Essential hypertension, benign 02/03/2007   Hypothyroidism 05/07/2006   T2DM (type 2 diabetes mellitus) (HCC) 05/07/2006   Obesity, Class III, BMI 40-49.9 (morbid obesity) (HCC) 05/07/2006   Iron deficiency anemia 05/07/2006   Obsessive-compulsive disorder 05/07/2006   Generalized anxiety disorder 05/07/2006    PCP: Theophilus Pagan, MD   REFERRING PROVIDER: Anders Otto DASEN, MD  REFERRING DIAG: Unsteadiness on feet (R26.81);Pain  Rationale for Evaluation and Treatment: Rehabilitation  THERAPY DIAG:  Other low back pain  Low back pain with left-sided sciatica, unspecified back pain laterality, unspecified chronicity  Muscle weakness (generalized)  Other abnormalities of gait and mobility  ONSET DATE: 16 months  SUBJECTIVE:  SUBJECTIVE STATEMENT:  Pt states she is motivated to get better. Wants to go to [employer] but is afraid of falling -- doesn't feel comfortable. Pt states she fell last week while walking. Pt is interested in aquatics. Did not have time to stretch this morning due to not waking up to her alarm.    PERTINENT HISTORY:  53 y.o. female with severe low back pain and left leg pain, worse with standing, walking, activity. We reviewed her imaging noting a widely patent central canal with degenerative changes of facet joints, large effusions/facet hypertrophy. She also has significantly atrophied musculature of lumbar sacral region. We had a long discussion regarding arthritis and weakened muscles and their role in lon back pain. We discussed the role of conservative cares in treating back pain. This includes activity modification, physical therapy, medications including NSAIDs for  flares, and injections. We discussed the relapsing nature of back pain. We discussed a spine-healthy lifestyle including a healthy diet, weight control, staying active, and maintaining core strength, stability, and lower extremity flexibility. We discussed that there is generally not a good surgical treatment for axial back pain unless it is associated with a mechanical/structural abnormality, radicular leg pain, weakness, or other neurologic signs/symptoms. We discussed physical therapy to work on balance as well as core strengthening for low back pain. I am providing exercises today.  I also mentioned role of facet injections, peripheral nerve stimulator and multifidus stimulator and how each may benefit her. She will follow up with Dr Waylon to discuss these options in greater detail.  As she is not a surgical candidate, she does not need a follow up appointment.   PAIN:  Are you having pain? Yes: NPRS scale: 8/10 Pain location: low back, LLE Pain description: ache, radiating Aggravating factors: activity Relieving factors: medication  PRECAUTIONS: None  RED FLAGS: None   WEIGHT BEARING RESTRICTIONS: No  FALLS:  Has patient fallen in last 6 months? Yes. Number of falls 1   OCCUPATION: not working  PLOF: Independent  PATIENT GOALS: To walk independently again  NEXT MD VISIT: TBD  OBJECTIVE:  Note: Objective measures were completed at Evaluation unless otherwise noted.  DIAGNOSTIC FINDINGS:  Multilevel degenerate age, greatest at at L4-L5 where there are  bilateral facet joint effusions with perifacet inflammatory  change/arthropathy and mild bilateral foraminal stenosis.   PATIENT SURVEYS:  Patient-specific activity scoring scheme (Point to one number):  0 represents "unable to perform." 10 represents "able to perform at prior level. 0 1 2 3 4 5 6 7 8 9  10 (Date and Score) Activity Initial  Activity Eval  01/05/24   Standing >30s   2 4   Walking 5 min  0 5   Standing from sitting 5 5  Average score 7/30 23% 14/30 46.6%    Total score = sum of the activity scores/number of activities Minimum detectable change (90%CI) for average score = 2 points Minimum detectable change (90%CI) for single activity score = 3 points PSFS developed by: Rosalee MYRTIS Marvis KYM Charlet CHRISTELLA., & Binkley, J. (1995). Assessing disability and change on individual  patients: a report of a patient specific measure. [employer] [place], 47, 741-736. Reproduced with the permission of the authors     MUSCLE LENGTH: Hamstrings: Right 90 deg; Left 90 deg Thomas test: TBD  POSTURE: UTA to excessive WS to L  PALPATION: deferred  LUMBAR ROM:   AROM eval  Flexion NT  Extension NT  Right lateral flexion NT  Left lateral flexion NT  Right rotation 75%  Left rotation 75%   (Blank rows = not tested)  LOWER EXTREMITY ROM:     Active  Right eval Left eval  Hip flexion    Hip extension    Hip abduction    Hip adduction    Hip internal rotation 0d 0d  Hip external rotation    Knee flexion    Knee extension    Ankle dorsiflexion    Ankle plantarflexion    Ankle inversion    Ankle eversion     (Blank rows = not tested)  LOWER EXTREMITY MMT:    MMT Right eval Left eval  Hip flexion    Hip extension    Hip abduction    Hip adduction    Hip internal rotation    Hip external rotation    Knee flexion    Knee extension    Ankle dorsiflexion    Ankle plantarflexion    Ankle inversion    Ankle eversion     (Blank rows = not tested)  LUMBAR SPECIAL TESTS:  Straight leg raise test: Negative and Slump test: Positive  FUNCTIONAL TESTS:  (eval) 30 seconds chair stand test 0 reps   (01/05/24)  30 sec chair stand test 2 reps; with 1 UE support  2 min walk test: 1 standing rest break 136' with SBQC  GAIT: Distance walked: 2ftx2 Assistive device utilized: Quad cane small base Level of assistance: Complete Independence Comments: slow cadence ,  WS L   TREATMENT:      [facility] Adult PT Treatment:                                                DATE: 01/05/24 Therapeutic Exercise: Nustep L4-6 x 8 min intermittent increase Seated figure 4 stretch 2x30 Seated piriformis stretch x30 Seated hamstring stretch x30 Self Care: Self massage with tennis ball along R glute/lumbar paraspinals/QL Updated HEP Therapeutic Activity: Rechecked goals Sitting clams GTB 2x10 Sitting glute iso heel press 5x5   [facility] Adult PT Treatment:                                                DATE: 12/29/23 Therapeutic Exercise: Nustep L4 8 min Neuromuscular re-ed: Supine hip fallouts GTB 15x B, 15/15  S/L clams GTB 15/15 Bridge with GTB 15x Therapeutic Activity: Seated hamstring stretch 30s x2 B Supine QL stretch 30s x2 B (pain!) Supine p-ball curl ups 15x B 15/15  [facility] Adult PT Treatment:                                                DATE: 12/10/2023 Therapeutic Exercise: NuStep 8' Seated hamstring stretch 2x1' B  Neuromuscular re-ed: Supine bridge 3x8, hold 3s Supine LTR on Pball 2x12 Supine pball roll up 2x12, hold 1s      PATIENT EDUCATION:  Education details: Discussed eval findings, rehab rationale and POC and patient is in agreement  Person educated: Patient Education method: Explanation and Handouts Education comprehension: verbalized understanding and needs further education  HOME EXERCISE PROGRAM: Access Code: [email or web address] URL: [email or web address] Date: 01/05/2024 Prepared  by: Fay Bagg April Earnie Starring  Exercises - Seated Hamstring Stretch  - 2-3 x daily - 7 x weekly - 2 sets - 1 reps - 14m hold - Seated Figure 4 Piriformis Stretch  - 2-3 x daily - 7 x weekly - 2 sets - 30 sec hold - Sit to Stand with Armchair  - 2 x daily - 5 x weekly - 1 sets - 5 reps - Supine Lower Trunk Rotation  - 1 x daily - 7 x weekly - 3 sets - 10 reps - Supine Bridge  - 1 x daily - 4 x weekly - 2-3 sets - 10 reps - 3s hold -  Hooklying Isometric Clamshell  - 1 x daily - 4 x weekly - 2-3 sets - 10 reps - 3 sec hold  ASSESSMENT:  CLINICAL IMPRESSION: Pt's progress has been limited from fall and transportation issues. She still has demonstrated increase in overall function. She has met 2 of her LTGs: able to improve her 30 sec sit to stand to 2 and PSFS average has also increased to goal level. She would still benefit from continued PT to work towards her standing and walking goals. Would like to try aquatics once again.    Patient is a 53 y.o. female who was seen today for physical therapy evaluation and treatment for chronic low back and L radicular symptoms. Patient unable to stand safely to assess trunk ROM as standing tolerance is less than 30s.  Marked mobility restrictions in B hip IR but good hamstring flexibility observed.  Patient understands rationale for rehab and is agreeable to POC   OBJECTIVE IMPAIRMENTS: Abnormal gait, decreased activity tolerance, decreased balance, decreased endurance, decreased knowledge of condition, decreased mobility, difficulty walking, decreased strength, improper body mechanics, postural dysfunction, obesity, and pain.   ACTIVITY LIMITATIONS: carrying, lifting, bending, sitting, standing, squatting, and stairs  PERSONAL FACTORS: Behavior pattern, Fitness, Past/current experiences, and Time since onset of injury/illness/exacerbation are also affecting patient's functional outcome.   REHAB POTENTIAL: Fair based on previous unsuccessful OPPT episodes, intractable pain and lack of definitive causation of symptoms  CLINICAL DECISION MAKING: Evolving/moderate complexity  EVALUATION COMPLEXITY: Moderate   GOALS: Goals reviewed with patient? No  SHORT TERM GOALS: Target date: 12/02/2023    Patient to demonstrate independence in HEP  Baseline: [id number] Goal status: MET  2.  Able to stand from sitting with single UE support. Baseline: 0 reps 01/05/24: 2 reps with single UE  support Goal status: MET   LONG TERM GOALS: Target date: 12/16/2023 --> new date: 02/02/2024  Patient will acknowledge 6/10 pain at least once during episode of care   Baseline: 10/10 01/05/24: the best pain has been 7/10 Goal status: IN PROGRESS  2.  Patient will score at least 13/30 on PSFS to signify clinically meaningful improvement in functional abilities.   Baseline: 7/30 01/05/24: 14/30 Goal status: MET  3.  Patient will increase 30s chair stand reps from 0 to 2 with/without arms to demonstrate and improved functional ability with less pain/difficulty as well as reduce fall risk.  Baseline: 0 01/05/24: 2 with 1 UE support Goal status: MET  4.  Patient to ambulate 282ft across level ground with LRAD Baseline: 17ft with [facility] 01/05/24: 166ft with SBQC within 2 min Goal status: IN PROGRESS    PLAN:  PT FREQUENCY: 2x/week  PT DURATION: 4 weeks  PLANNED INTERVENTIONS: 97164- PT Re-evaluation, 97750- Physical Performance Testing, 97110-Therapeutic exercises, 97530- Therapeutic activity, [id number]- Neuromuscular re-education, 97535- Self Care, 02859- Manual therapy,  02883- Gait training, 02886- Aquatic Therapy, [phone]- Electrical stimulation (unattended), [phone] (1-2 muscles), 20561 (3+ muscles)- Dry Needling, Patient/Family education, Balance training, Stair training, Taping, Joint mobilization, Spinal mobilization, Cryotherapy, and Moist heat.  PLAN FOR NEXT SESSION: HEP review and update, manual techniques as appropriate, aerobic tasks, ROM and flexibility activities, strengthening and PREs, TPDN, gait and balance training as needed   Date of referral: 10/30/23 Referring provider: Anders Otto DASEN, MD Referring diagnosis? Unsteadiness on feet (R26.81);Pain Treatment diagnosis? (if different than referring diagnosis) M54.42,G89.29 (ICD-10-CM) - Chronic bilateral low back pain with left-sided sciatica  What was this (referring dx) caused by? Ongoing Issue  Lysle of Condition:  Chronic (continuous duration > 3 months)   Laterality: Lt  Current Functional Measure Score: Other PSFS 7/30  Objective measurements identify impairments when they are compared to normal values, the uninvolved extremity, and prior level of function.  [x]  Yes  []  No  Objective assessment of functional ability: Moderate functional limitations   Briefly describe symptoms: 53 y.o. female with severe low back pain and left leg pain, worse with standing, walking, activity  How did symptoms start: chronic  Average pain intensity:  Last 24 hours: 8  Past week: 8  How often does the pt experience symptoms? Constantly  How much have the symptoms interfered with usual daily activities? Quite a bit  How has condition changed since care began at this facility? NA - initial visit  In general, how is the patients overall health? Good   BACK PAIN (STarT Back Screening Tool) Has pain spread down the leg(s) at some time in the last 2 weeks? y Has there been pain in the shoulder or neck at some time in the last 2 weeks? n Has the pt only walked short distances because of back pain? y Has patient dressed more slowly because of back pain in the past 2 weeks? y Does patient think it's not safe for a person with this condition to be physically active? y Does patient have worrying thoughts a lot of the time? y Does patient feel back pain is terrible and will never get any better? y Has patient stopped enjoying things they usually enjoy? y     Jazmine Heckman April Ma L [place], PT, DPT 01/05/2024, 8:56 AM

## 2024-01-05 NOTE — Telephone Encounter (Signed)
 Patient calls nurse line regarding issues with picking up Mounjaro .   Called pharmacy. They have rx for Mounjaro  15 mg. They will fill this and have ready for patient around 4 pm.   Called patient and advised of update.   Patient appreciative.   Chiquita JAYSON English, RN

## 2024-01-06 NOTE — Therapy (Deleted)
 OUTPATIENT PHYSICAL THERAPY TREATMENT NOTE   Patient Name: [name] MRN: [id number] DOB:03-04-1971, 53 y.o., female Today's Date: 01/06/2024  END OF SESSION:        Past Medical History:  Diagnosis Date   Acute renal failure 05/27/2010   hemodialysis for 6 weeks   Anxiety    Asthma    Depression    Diabetes mellitus    Hypertension    Rhabdomyolysis 06/06/2010   after drug overdose   Suicide attempt by drug ingestion (HCC) 06/05/2010   result rhabdomyolosis and ARF requrining dialysis    SVT (supraventricular tachycardia) 05/28/2021   Past Surgical History:  Procedure Laterality Date   ANKLE ARTHROSCOPY Right 08/17/2015   Procedure: RIGHT ANKLE ARTHROSCOPY WITH SYNOVECTOMY AND LOOSE BODY EXCISION;  Surgeon: Maude Herald, MD;  Location: [facility] OR;  Service: Orthopedics;  Laterality: Right;   CHOLECYSTECTOMY     TUBAL LIGATION  1998   Patient Active Problem List   Diagnosis Date Noted   Neuropathic pain 11/03/2023   Sciatica of left side 11/03/2023   Chronic health problem 11/02/2023   Hypomagnesemia 11/02/2023   SOB (shortness of breath) 11/02/2023   Weakness of both lower limbs 11/02/2023   Acute on chronic congestive heart failure (HCC) 11/01/2023   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   Chronic low back pain with left-sided sciatica 07/08/2022   Chronic systolic CHF (congestive heart failure) (HCC) - LVEF 35-40% by echo 03-2023    Bilateral knee pain 03/14/2019   HLD (hyperlipidemia) 03/19/2017   Asthma 03/14/2017   Major depressive disorder, recurrent severe without psychotic features (HCC) 05/11/2015   Vitamin D  deficiency 04/11/2015   CKD stage 3b, GFR 30-44 ml/min (HCC) - baseline Scr 1.7-2.1 11/06/2010   Essential hypertension, benign 02/03/2007   Hypothyroidism 05/07/2006   T2DM (type 2 diabetes mellitus) (HCC) 05/07/2006   Obesity, Class III, BMI 40-49.9 (morbid obesity) (HCC) 05/07/2006   Iron deficiency anemia 05/07/2006   Obsessive-compulsive disorder  05/07/2006   Generalized anxiety disorder 05/07/2006    PCP: Theophilus Pagan, MD   REFERRING PROVIDER: Anders Otto DASEN, MD  REFERRING DIAG: Unsteadiness on feet (R26.81);Pain  Rationale for Evaluation and Treatment: Rehabilitation  THERAPY DIAG:  No diagnosis found.  ONSET DATE: 16 months  SUBJECTIVE:                                                                                                                                                                                           SUBJECTIVE STATEMENT:  Pt states she is motivated to get better. Wants to go to [employer] but is afraid of falling -- doesn't feel comfortable. Pt states she fell  last week while walking. Pt is interested in aquatics. Did not have time to stretch this morning due to not waking up to her alarm.    PERTINENT HISTORY:  53 y.o. female with severe low back pain and left leg pain, worse with standing, walking, activity. We reviewed her imaging noting a widely patent central canal with degenerative changes of facet joints, large effusions/facet hypertrophy. She also has significantly atrophied musculature of lumbar sacral region. We had a long discussion regarding arthritis and weakened muscles and their role in lon back pain. We discussed the role of conservative cares in treating back pain. This includes activity modification, physical therapy, medications including NSAIDs for flares, and injections. We discussed the relapsing nature of back pain. We discussed a spine-healthy lifestyle including a healthy diet, weight control, staying active, and maintaining core strength, stability, and lower extremity flexibility. We discussed that there is generally not a good surgical treatment for axial back pain unless it is associated with a mechanical/structural abnormality, radicular leg pain, weakness, or other neurologic signs/symptoms. We discussed physical therapy to work on balance as well as core strengthening  for low back pain. I am providing exercises today.  I also mentioned role of facet injections, peripheral nerve stimulator and multifidus stimulator and how each may benefit her. She will follow up with Dr Waylon to discuss these options in greater detail.  As she is not a surgical candidate, she does not need a follow up appointment.   PAIN:  Are you having pain? Yes: NPRS scale: 8/10 Pain location: low back, LLE Pain description: ache, radiating Aggravating factors: activity Relieving factors: medication  PRECAUTIONS: None  RED FLAGS: None   WEIGHT BEARING RESTRICTIONS: No  FALLS:  Has patient fallen in last 6 months? Yes. Number of falls 1   OCCUPATION: not working  PLOF: Independent  PATIENT GOALS: To walk independently again  NEXT MD VISIT: TBD  OBJECTIVE:  Note: Objective measures were completed at Evaluation unless otherwise noted.  DIAGNOSTIC FINDINGS:  Multilevel degenerate age, greatest at at L4-L5 where there are  bilateral facet joint effusions with perifacet inflammatory  change/arthropathy and mild bilateral foraminal stenosis.   PATIENT SURVEYS:  Patient-specific activity scoring scheme (Point to one number):  0 represents "unable to perform." 10 represents "able to perform at prior level. 0 1 2 3 4 5 6 7 8 9  10 (Date and Score) Activity Initial  Activity Eval  01/05/24   Standing >30s   2 4   Walking 5 min  0 5  Standing from sitting 5 5  Average score 7/30 23% 14/30 46.6%    Total score = sum of the activity scores/number of activities Minimum detectable change (90%CI) for average score = 2 points Minimum detectable change (90%CI) for single activity score = 3 points PSFS developed by: Rosalee MYRTIS Marvis KYM Charlet CHRISTELLA., & Binkley, J. (1995). Assessing disability and change on individual  patients: a report of a patient specific measure. [employer] [place], 47, 741-736. Reproduced with the permission of the authors     MUSCLE  LENGTH: Hamstrings: Right 90 deg; Left 90 deg Thomas test: TBD  POSTURE: UTA to excessive WS to L  PALPATION: deferred  LUMBAR ROM:   AROM eval  Flexion NT  Extension NT  Right lateral flexion NT  Left lateral flexion NT  Right rotation 75%  Left rotation 75%   (Blank rows = not tested)  LOWER EXTREMITY ROM:     Active  Right eval Left eval  Hip flexion    Hip extension    Hip abduction    Hip adduction    Hip internal rotation 0d 0d  Hip external rotation    Knee flexion    Knee extension    Ankle dorsiflexion    Ankle plantarflexion    Ankle inversion    Ankle eversion     (Blank rows = not tested)  LOWER EXTREMITY MMT:    MMT Right eval Left eval  Hip flexion    Hip extension    Hip abduction    Hip adduction    Hip internal rotation    Hip external rotation    Knee flexion    Knee extension    Ankle dorsiflexion    Ankle plantarflexion    Ankle inversion    Ankle eversion     (Blank rows = not tested)  LUMBAR SPECIAL TESTS:  Straight leg raise test: Negative and Slump test: Positive  FUNCTIONAL TESTS:  (eval) 30 seconds chair stand test 0 reps   (01/05/24)  30 sec chair stand test 2 reps; with 1 UE support  2 min walk test: 1 standing rest break 136' with SBQC  GAIT: Distance walked: 37ftx2 Assistive device utilized: Quad cane small base Level of assistance: Complete Independence Comments: slow cadence , WS L   TREATMENT:      [facility] Adult PT Treatment:                                                DATE: 01/05/24 Therapeutic Exercise: Nustep L4-6 x 8 min intermittent increase Seated figure 4 stretch 2x30 Seated piriformis stretch x30 Seated hamstring stretch x30 Self Care: Self massage with tennis ball along R glute/lumbar paraspinals/QL Updated HEP Therapeutic Activity: Rechecked goals Sitting clams GTB 2x10 Sitting glute iso heel press 5x5   [facility] Adult PT Treatment:                                                 DATE: 12/29/23 Therapeutic Exercise: Nustep L4 8 min Neuromuscular re-ed: Supine hip fallouts GTB 15x B, 15/15  S/L clams GTB 15/15 Bridge with GTB 15x Therapeutic Activity: Seated hamstring stretch 30s x2 B Supine QL stretch 30s x2 B (pain!) Supine p-ball curl ups 15x B 15/15  [facility] Adult PT Treatment:                                                DATE: 12/10/2023 Therapeutic Exercise: NuStep 8' Seated hamstring stretch 2x1' B  Neuromuscular re-ed: Supine bridge 3x8, hold 3s Supine LTR on Pball 2x12 Supine pball roll up 2x12, hold 1s      PATIENT EDUCATION:  Education details: Discussed eval findings, rehab rationale and POC and patient is in agreement  Person educated: Patient Education method: Explanation and Handouts Education comprehension: verbalized understanding and needs further education  HOME EXERCISE PROGRAM: Access Code: [email or web address] URL: [email or web address] Date: 01/05/2024 Prepared by: Gellen April Marie Nonato  Exercises - Seated Hamstring Stretch  - 2-3 x daily - 7 x weekly - 2 sets - 1 reps - 43m hold -  Seated Figure 4 Piriformis Stretch  - 2-3 x daily - 7 x weekly - 2 sets - 30 sec hold - Sit to Stand with Armchair  - 2 x daily - 5 x weekly - 1 sets - 5 reps - Supine Lower Trunk Rotation  - 1 x daily - 7 x weekly - 3 sets - 10 reps - Supine Bridge  - 1 x daily - 4 x weekly - 2-3 sets - 10 reps - 3s hold - Hooklying Isometric Clamshell  - 1 x daily - 4 x weekly - 2-3 sets - 10 reps - 3 sec hold  ASSESSMENT:  CLINICAL IMPRESSION: Pt's progress has been limited from fall and transportation issues. She still has demonstrated increase in overall function. She has met 2 of her LTGs: able to improve her 30 sec sit to stand to 2 and PSFS average has also increased to goal level. She would still benefit from continued PT to work towards her standing and walking goals. Would like to try aquatics once again.    Patient is a 53 y.o. female who  was seen today for physical therapy evaluation and treatment for chronic low back and L radicular symptoms. Patient unable to stand safely to assess trunk ROM as standing tolerance is less than 30s.  Marked mobility restrictions in B hip IR but good hamstring flexibility observed.  Patient understands rationale for rehab and is agreeable to POC   OBJECTIVE IMPAIRMENTS: Abnormal gait, decreased activity tolerance, decreased balance, decreased endurance, decreased knowledge of condition, decreased mobility, difficulty walking, decreased strength, improper body mechanics, postural dysfunction, obesity, and pain.   ACTIVITY LIMITATIONS: carrying, lifting, bending, sitting, standing, squatting, and stairs  PERSONAL FACTORS: Behavior pattern, Fitness, Past/current experiences, and Time since onset of injury/illness/exacerbation are also affecting patient's functional outcome.   REHAB POTENTIAL: Fair based on previous unsuccessful OPPT episodes, intractable pain and lack of definitive causation of symptoms  CLINICAL DECISION MAKING: Evolving/moderate complexity  EVALUATION COMPLEXITY: Moderate   GOALS: Goals reviewed with patient? No  SHORT TERM GOALS: Target date: 12/02/2023    Patient to demonstrate independence in HEP  Baseline: [id number] Goal status: MET  2.  Able to stand from sitting with single UE support. Baseline: 0 reps 01/05/24: 2 reps with single UE support Goal status: MET   LONG TERM GOALS: Target date: 12/16/2023 --> new date: 02/02/2024  Patient will acknowledge 6/10 pain at least once during episode of care   Baseline: 10/10 01/05/24: the best pain has been 7/10 Goal status: IN PROGRESS  2.  Patient will score at least 13/30 on PSFS to signify clinically meaningful improvement in functional abilities.   Baseline: 7/30 01/05/24: 14/30 Goal status: MET  3.  Patient will increase 30s chair stand reps from 0 to 2 with/without arms to demonstrate and improved functional  ability with less pain/difficulty as well as reduce fall risk.  Baseline: 0 01/05/24: 2 with 1 UE support Goal status: MET  4.  Patient to ambulate 266ft across level ground with LRAD Baseline: 30ft with [facility] [address] 01/05/24: 125ft with SBQC within 2 min Goal status: IN PROGRESS    PLAN:  PT FREQUENCY: 2x/week  PT DURATION: 4 weeks  PLANNED INTERVENTIONS: 97164- PT Re-evaluation, 97750- Physical Performance Testing, 97110-Therapeutic exercises, 97530- Therapeutic activity, 97112- Neuromuscular re-education, 97535- Self Care, [id number]- Manual therapy, [id number]- Gait training, [phone]- Aquatic Therapy, [phone]- Electrical stimulation (unattended), [phone] (1-2 muscles), [phone] (3+ muscles)- Dry Needling, Patient/Family education, Balance training, Stair training, Taping, Joint mobilization, Spinal mobilization, Cryotherapy,  and Moist heat.  PLAN FOR NEXT SESSION: HEP review and update, manual techniques as appropriate, aerobic tasks, ROM and flexibility activities, strengthening and PREs, TPDN, gait and balance training as needed   Date of referral: 10/30/23 Referring provider: Anders Otto DASEN, MD Referring diagnosis? Unsteadiness on feet (R26.81);Pain Treatment diagnosis? (if different than referring diagnosis) M54.42,G89.29 (ICD-10-CM) - Chronic bilateral low back pain with left-sided sciatica  What was this (referring dx) caused by? Ongoing Issue  Lysle of Condition: Chronic (continuous duration > 3 months)   Laterality: Lt  Current Functional Measure Score: Other PSFS 7/30  Objective measurements identify impairments when they are compared to normal values, the uninvolved extremity, and prior level of function.  [x]  Yes  []  No  Objective assessment of functional ability: Moderate functional limitations   Briefly describe symptoms: 53 y.o. female with severe low back pain and left leg pain, worse with standing, walking, activity  How did symptoms start: chronic  Average pain  intensity:  Last 24 hours: 8  Past week: 8  How often does the pt experience symptoms? Constantly  How much have the symptoms interfered with usual daily activities? Quite a bit  How has condition changed since care began at this facility? NA - initial visit  In general, how is the patients overall health? Good   BACK PAIN (STarT Back Screening Tool) Has pain spread down the leg(s) at some time in the last 2 weeks? y Has there been pain in the shoulder or neck at some time in the last 2 weeks? n Has the pt only walked short distances because of back pain? y Has patient dressed more slowly because of back pain in the past 2 weeks? y Does patient think it's not safe for a person with this condition to be physically active? y Does patient have worrying thoughts a lot of the time? y Does patient feel back pain is terrible and will never get any better? y Has patient stopped enjoying things they usually enjoy? y     Reyes CHRISTELLA Kohut, PT, DPT 01/06/2024, 4:23 PM

## 2024-01-07 ENCOUNTER — Telehealth: Payer: Self-pay

## 2024-01-07 DIAGNOSIS — J45909 Unspecified asthma, uncomplicated: Secondary | ICD-10-CM

## 2024-01-07 MED ORDER — IPRATROPIUM-ALBUTEROL 0.5-2.5 (3) MG/3ML IN SOLN: 3.0000 mL | Freq: Three times a day (TID) | RESPIRATORY_TRACT | 3 refills | Status: AC | PRN

## 2024-01-07 NOTE — Telephone Encounter (Signed)
 Returned patient phone call, reports she is having some mild shortness of breath and bilateral peripheral edema.  Still taking her Lasix  40 mg daily but wondering if she needs to take an extra dose.  Discussed would like her to be seen for evaluation for heart failure exacerbation given she was recently hospitalized for this in 10/2023.  Okay to give second dose of Lasix  tonight and follow-up for her morning appointment at 9:10 with Dr. Howell.  Recommend repeat BMP given elevated creatinine at last visit.  Will message physician seeing the patient to be aware.  Izetta Nap, DO

## 2024-01-07 NOTE — Telephone Encounter (Signed)
 Patient calls nurse line in regards to lasix  prescription.   She reports she has taken her one dose and reports continued bilateral leg swelling.   She reports pain, however no weeping, chest pains or shortness of breath.   She reports she would like to take an extra dose today.   She reports she will make an apt with PCP soon.   Will forward to PCP.   ED precautions discussed.

## 2024-01-08 ENCOUNTER — Ambulatory Visit: Payer: Self-pay

## 2024-01-08 ENCOUNTER — Ambulatory Visit

## 2024-01-11 ENCOUNTER — Encounter: Payer: Self-pay | Admitting: Family Medicine

## 2024-01-11 ENCOUNTER — Ambulatory Visit (INDEPENDENT_AMBULATORY_CARE_PROVIDER_SITE_OTHER): Admitting: Family Medicine

## 2024-01-11 VITALS — BP 121/66 | HR 99 | Ht 71.0 in | Wt 338.2 lb

## 2024-01-11 DIAGNOSIS — M5431 Sciatica, right side: Secondary | ICD-10-CM

## 2024-01-11 DIAGNOSIS — E118 Type 2 diabetes mellitus with unspecified complications: Secondary | ICD-10-CM

## 2024-01-11 DIAGNOSIS — R7989 Other specified abnormal findings of blood chemistry: Secondary | ICD-10-CM

## 2024-01-11 DIAGNOSIS — M5432 Sciatica, left side: Secondary | ICD-10-CM

## 2024-01-11 NOTE — Progress Notes (Unsigned)
    SUBJECTIVE:   CHIEF COMPLAINT / HPI:   LE is a 53yo F that pf leg pain. - Reports she is struggling with her chronic sciatica pain in BL legs, especially R.  - Reports a lot of stress due to this pain - Feels like her feet are more swollen for the past 2 weeks - Denies trouble breathing  - Per chart review, she initially reported SOB per MyChart, but denies it today. - Her main concern today is her uncontrolled pain. - Her psychiatrist is at [facility] Last saw them a month ago.  - Reports taking her lasix , denies missing any recent doses. Denies any extra salt intake. - Also sees [facility] for pain management. This has also been helpful.  PERTINENT  PMH / PSH: HTN, CHF, asthma, CKD, MDD, sciatica  OBJECTIVE:   BP 121/66   Pulse 99   Ht 5' 11 (1.803 m)   Wt (!) 338 lb 4 oz (153.4 kg)   SpO2 98%   BMI 47.18 kg/m   General: Alert, pleasant woman speaking comfortably full sentences on RA. NAD. HEENT: NCAT. MMM. CV: RRR, no murmurs. Cap refill <2. Resp: CTAB, no wheezing or crackles. Normal WOB on RA.   Skin: Warm, well perfused  Ext: No pitting edema. TTP along lateral aspect of R ankle. Ankles appear symmetric, nonerythematous.   ASSESSMENT/PLAN:   Assessment & Plan Bilateral sciatica Uncontrolled chronic sciatica pain. No notable swelling or dyspnea, lung and extremity exam benign; thus low cf CHF exac. Already on multi-modal pain control with gabapentin , PT, robaxin , oxycodone . Previously tried duloxetine  without success. I am hesitant to start another SNRI given her hx of bipolar; advised to discuss with her psychiatrist about trying an SNRI.  - Advised to wear compression stocking to help with swelling - Advised to wear shoe insoles for added support - Continue f/u with PT and pain clinic - Cont gabapentin , robaxin , and oxy Elevated serum creatinine Was previously elevated, will recheck today   Samantha Nearing, MD [facility]

## 2024-01-11 NOTE — Patient Instructions (Addendum)
 1) Pain control is diificult to achieve but will need regular exercise to strengthen your muscles. - Start to wear compression stockings during the day time to decrease ankle swelling - Wear insoles to provide more support for your legs and feet. This can help decrease the strain on your feet and back.   Dr. Heriberto [employer] with Massaging Gel - Designed for Men & Women with Hard-Working Feet, Eliminates Sore Muscles and Tired Legs, Arch Support Inserts with Odor Control

## 2024-01-12 ENCOUNTER — Telehealth: Payer: Self-pay

## 2024-01-12 ENCOUNTER — Ambulatory Visit: Payer: Self-pay | Admitting: Family Medicine

## 2024-01-12 DIAGNOSIS — G629 Polyneuropathy, unspecified: Secondary | ICD-10-CM

## 2024-01-12 LAB — BASIC METABOLIC PANEL WITH GFR
BUN/Creatinine Ratio: 15 (ref 9–23)
BUN: 34 mg/dL — ABNORMAL HIGH (ref 6–24)
CO2: 25 mmol/L (ref 20–29)
Calcium: 9.3 mg/dL (ref 8.7–10.2)
Chloride: 105 mmol/L (ref 96–106)
Creatinine, Ser: 2.22 mg/dL — ABNORMAL HIGH (ref 0.57–1.00)
Glucose: 88 mg/dL (ref 70–99)
Potassium: 5.3 mmol/L — ABNORMAL HIGH (ref 3.5–5.2)
Sodium: 145 mmol/L — ABNORMAL HIGH (ref 134–144)
eGFR: 26 mL/min/1.73 — ABNORMAL LOW (ref >59)

## 2024-01-12 NOTE — Telephone Encounter (Signed)
 Patient calls nurse line again in regards to referral.   She reports she contacted Dr. Charlyne office, however they reports she will need a new referral.   Will forward to PCP.    Sharri Charlyne MD [address] [place], [place] [address]

## 2024-01-12 NOTE — Addendum Note (Signed)
 Addended by: Trevor Duty on: 01/12/2024 06:01 PM   Modules accepted: Orders

## 2024-01-12 NOTE — Telephone Encounter (Signed)
 Patient calls nurse line requesting a referral to Neurology.   She reports she would like to see Dr. Charlyne.  Patient advised she was referred to his office back in May.   Office information given to patient to call and schedule.

## 2024-01-15 ENCOUNTER — Ambulatory Visit: Admitting: Physical Therapy

## 2024-01-15 ENCOUNTER — Ambulatory Visit: Attending: Family Medicine

## 2024-01-15 DIAGNOSIS — M5442 Lumbago with sciatica, left side: Secondary | ICD-10-CM | POA: Insufficient documentation

## 2024-01-15 DIAGNOSIS — M6281 Muscle weakness (generalized): Secondary | ICD-10-CM | POA: Insufficient documentation

## 2024-01-15 DIAGNOSIS — M5459 Other low back pain: Secondary | ICD-10-CM | POA: Diagnosis present

## 2024-01-15 DIAGNOSIS — R2689 Other abnormalities of gait and mobility: Secondary | ICD-10-CM | POA: Diagnosis present

## 2024-01-15 NOTE — Therapy (Signed)
 OUTPATIENT PHYSICAL THERAPY TREATMENT NOTE   Patient Name: [name] MRN: [id number] DOB:Jun 02, 1970, 53 y.o., female Today's Date: 01/15/2024  END OF SESSION:  PT End of Session - 01/15/24 1135     Visit Number 7    Number of Visits 14    Date for Recertification  02/02/24    Authorization Type [employer]    PT Start Time 1147    PT Stop Time 1219    PT Time Calculation (min) 32 min    Activity Tolerance Patient limited by pain    Behavior During Therapy [facility] for tasks assessed/performed          Past Medical History:  Diagnosis Date   Acute renal failure 05/27/2010   hemodialysis for 6 weeks   Anxiety    Asthma    Depression    Diabetes mellitus    Hypertension    Rhabdomyolysis 06/06/2010   after drug overdose   Suicide attempt by drug ingestion (HCC) 06/05/2010   result rhabdomyolosis and ARF requrining dialysis    SVT (supraventricular tachycardia) 05/28/2021   Past Surgical History:  Procedure Laterality Date   ANKLE ARTHROSCOPY Right 08/17/2015   Procedure: RIGHT ANKLE ARTHROSCOPY WITH SYNOVECTOMY AND LOOSE BODY EXCISION;  Surgeon: Maude Herald, MD;  Location: [facility] OR;  Service: Orthopedics;  Laterality: Right;   CHOLECYSTECTOMY     TUBAL LIGATION  1998   Patient Active Problem List   Diagnosis Date Noted   Neuropathic pain 11/03/2023   Sciatica of left side 11/03/2023   Chronic health problem 11/02/2023   Hypomagnesemia 11/02/2023   SOB (shortness of breath) 11/02/2023   Weakness of both lower limbs 11/02/2023   Acute on chronic congestive heart failure (HCC) 11/01/2023   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   Chronic low back pain with left-sided sciatica 07/08/2022   Chronic systolic CHF (congestive heart failure) (HCC) - LVEF 35-40% by echo 03-2023    Bilateral knee pain 03/14/2019   HLD (hyperlipidemia) 03/19/2017   Asthma 03/14/2017   Major depressive disorder, recurrent severe without psychotic features (HCC) 05/11/2015   Vitamin D  deficiency 04/11/2015    CKD stage 3b, GFR 30-44 ml/min (HCC) - baseline Scr 1.7-2.1 11/06/2010   Essential hypertension, benign 02/03/2007   Hypothyroidism 05/07/2006   T2DM (type 2 diabetes mellitus) (HCC) 05/07/2006   Obesity, Class III, BMI 40-49.9 (morbid obesity) (HCC) 05/07/2006   Iron deficiency anemia 05/07/2006   Obsessive-compulsive disorder 05/07/2006   Generalized anxiety disorder 05/07/2006    PCP: Theophilus Pagan, MD   REFERRING PROVIDER: Anders Otto DASEN, MD  REFERRING DIAG: Unsteadiness on feet (R26.81);Pain  Rationale for Evaluation and Treatment: Rehabilitation  THERAPY DIAG:  Other low back pain  Low back pain with left-sided sciatica, unspecified back pain laterality, unspecified chronicity  Muscle weakness (generalized)  Other abnormalities of gait and mobility  ONSET DATE: 16 months  SUBJECTIVE:  SUBJECTIVE STATEMENT:   Patient reports some pain, overall doing better. She wants to ride the bike at [employer].  PERTINENT HISTORY:  53 y.o. female with severe low back pain and left leg pain, worse with standing, walking, activity. We reviewed her imaging noting a widely patent central canal with degenerative changes of facet joints, large effusions/facet hypertrophy. She also has significantly atrophied musculature of lumbar sacral region. We had a long discussion regarding arthritis and weakened muscles and their role in lon back pain. We discussed the role of conservative cares in treating back pain. This includes activity modification, physical therapy, medications including NSAIDs for flares, and injections. We discussed the relapsing nature of back pain. We discussed a spine-healthy lifestyle including a healthy diet, weight control, staying active, and maintaining core strength, stability,  and lower extremity flexibility. We discussed that there is generally not a good surgical treatment for axial back pain unless it is associated with a mechanical/structural abnormality, radicular leg pain, weakness, or other neurologic signs/symptoms. We discussed physical therapy to work on balance as well as core strengthening for low back pain. I am providing exercises today.  I also mentioned role of facet injections, peripheral nerve stimulator and multifidus stimulator and how each may benefit her. She will follow up with Dr Waylon to discuss these options in greater detail.  As she is not a surgical candidate, she does not need a follow up appointment.   PAIN:  Are you having pain? Yes: NPRS scale: 8/10 Pain location: low back, LLE Pain description: ache, radiating Aggravating factors: activity Relieving factors: medication  PRECAUTIONS: None  RED FLAGS: None   WEIGHT BEARING RESTRICTIONS: No  FALLS:  Has patient fallen in last 6 months? Yes. Number of falls 1   OCCUPATION: not working  PLOF: Independent  PATIENT GOALS: To walk independently again  NEXT MD VISIT: TBD  OBJECTIVE:  Note: Objective measures were completed at Evaluation unless otherwise noted.  DIAGNOSTIC FINDINGS:  Multilevel degenerate age, greatest at at L4-L5 where there are  bilateral facet joint effusions with perifacet inflammatory  change/arthropathy and mild bilateral foraminal stenosis.   PATIENT SURVEYS:  Patient-specific activity scoring scheme (Point to one number):  0 represents "unable to perform." 10 represents "able to perform at prior level. 0 1 2 3 4 5 6 7 8 9  10 (Date and Score) Activity Initial  Activity Eval  01/05/24   Standing >30s   2 4   Walking 5 min  0 5  Standing from sitting 5 5  Average score 7/30 23% 14/30 46.6%    Total score = sum of the activity scores/number of activities Minimum detectable change (90%CI) for average score = 2 points Minimum  detectable change (90%CI) for single activity score = 3 points PSFS developed by: Rosalee MYRTIS Marvis KYM Charlet CHRISTELLA., & Binkley, J. (1995). Assessing disability and change on individual  patients: a report of a patient specific measure. [employer] [place], 47, 741-736. Reproduced with the permission of the authors     MUSCLE LENGTH: Hamstrings: Right 90 deg; Left 90 deg Thomas test: TBD  POSTURE: UTA to excessive WS to L  PALPATION: deferred  LUMBAR ROM:   AROM eval  Flexion NT  Extension NT  Right lateral flexion NT  Left lateral flexion NT  Right rotation 75%  Left rotation 75%   (Blank rows = not tested)  LOWER EXTREMITY ROM:     Active  Right eval Left eval  Hip flexion    Hip extension  Hip abduction    Hip adduction    Hip internal rotation 0d 0d  Hip external rotation    Knee flexion    Knee extension    Ankle dorsiflexion    Ankle plantarflexion    Ankle inversion    Ankle eversion     (Blank rows = not tested)  LOWER EXTREMITY MMT:    MMT Right eval Left eval  Hip flexion    Hip extension    Hip abduction    Hip adduction    Hip internal rotation    Hip external rotation    Knee flexion    Knee extension    Ankle dorsiflexion    Ankle plantarflexion    Ankle inversion    Ankle eversion     (Blank rows = not tested)  LUMBAR SPECIAL TESTS:  Straight leg raise test: Negative and Slump test: Positive  FUNCTIONAL TESTS:  (eval) 30 seconds chair stand test 0 reps   (01/05/24)  30 sec chair stand test 2 reps; with 1 UE support  2 min walk test: 1 standing rest break 136' with SBQC  GAIT: Distance walked: 17ftx2 Assistive device utilized: Quad cane small base Level of assistance: Complete Independence Comments: slow cadence , WS L   TREATMENT:      [facility] Adult PT Treatment:                                                DATE: 01/15/24 Aquatic therapy at [facility]- [address] - therapeutic pool temp approximately 91  degrees. Pt enters building in wheelchair aided by family. Treatment took place in water 3.8 to  4 ft 8 in. deep depending upon activity.  Pt entered and exited the pool via stair and handrails with step to pattern. Patient entered water for aquatic therapy for first time and was introduced to principles and therapeutic effects of water as they ambulated and acclimated to pool. Aquatic Exercise: Walking forward/side stepping Standing with UE support edge of pool: Hip abd/add x10 BIL (painful RLE) Hamstring curl x10 BIL Squats x10 Hip IR/ER x10 BIL Heel raises x20 Hip ext/flex with knee straight x 10 BIL  Pt requires the buoyancy of water for active assisted exercises with buoyancy supported for strengthening and AROM exercises. Hydrostatic pressure also supports joints by unweighting joint load by at least 50 % in 3-4 feet depth water. 80% in chest to neck deep water. Water will provide assistance with movement using the current and laminar flow while the buoyancy reduces weight bearing. Pt requires the viscosity of the water for resistance with strengthening exercises.   [facility] Adult PT Treatment:                                                DATE: 01/05/24 Therapeutic Exercise: Nustep L4-6 x 8 min intermittent increase Seated figure 4 stretch 2x30 Seated piriformis stretch x30 Seated hamstring stretch x30 Self Care: Self massage with tennis ball along R glute/lumbar paraspinals/QL Updated HEP Therapeutic Activity: Rechecked goals Sitting clams GTB 2x10 Sitting glute iso heel press 5x5   [facility] Adult PT Treatment:  DATE: 12/29/23 Therapeutic Exercise: Nustep L4 8 min Neuromuscular re-ed: Supine hip fallouts GTB 15x B, 15/15  S/L clams GTB 15/15 Bridge with GTB 15x Therapeutic Activity: Seated hamstring stretch 30s x2 B Supine QL stretch 30s x2 B (pain!) Supine p-ball curl ups 15x B 15/15   PATIENT EDUCATION:  Education  details: Discussed eval findings, rehab rationale and POC and patient is in agreement  Person educated: Patient Education method: Explanation and Handouts Education comprehension: verbalized understanding and needs further education  HOME EXERCISE PROGRAM: Access Code: [email or web address] URL: [email or web address] Date: 01/05/2024 Prepared by: Gellen April Marie Nonato  Exercises - Seated Hamstring Stretch  - 2-3 x daily - 7 x weekly - 2 sets - 1 reps - 47m hold - Seated Figure 4 Piriformis Stretch  - 2-3 x daily - 7 x weekly - 2 sets - 30 sec hold - Sit to Stand with Armchair  - 2 x daily - 5 x weekly - 1 sets - 5 reps - Supine Lower Trunk Rotation  - 1 x daily - 7 x weekly - 3 sets - 10 reps - Supine Bridge  - 1 x daily - 4 x weekly - 2-3 sets - 10 reps - 3s hold - Hooklying Isometric Clamshell  - 1 x daily - 4 x weekly - 2-3 sets - 10 reps - 3 sec hold  ASSESSMENT:  CLINICAL IMPRESSION:  Patient familiar to therapist and aquatic environment, reports some pain but is overall doing well. Session today focused on core and hip strengthening as well as improving standing activity tolerance in the aquatic environment for use of buoyancy to offload joints and the viscosity of water as resistance during therapeutic exercise. She remains very limited by pain, only able to tolerate 30 minutes, but shows determination to continue improving. Patient continues to benefit from skilled PT services on land and aquatic based and should be progressed as able to improve functional independence.    Patient is a 53 y.o. female who was seen today for physical therapy evaluation and treatment for chronic low back and L radicular symptoms. Patient unable to stand safely to assess trunk ROM as standing tolerance is less than 30s.  Marked mobility restrictions in B hip IR but good hamstring flexibility observed.  Patient understands rationale for rehab and is agreeable to POC   OBJECTIVE IMPAIRMENTS: Abnormal  gait, decreased activity tolerance, decreased balance, decreased endurance, decreased knowledge of condition, decreased mobility, difficulty walking, decreased strength, improper body mechanics, postural dysfunction, obesity, and pain.   ACTIVITY LIMITATIONS: carrying, lifting, bending, sitting, standing, squatting, and stairs  PERSONAL FACTORS: Behavior pattern, Fitness, Past/current experiences, and Time since onset of injury/illness/exacerbation are also affecting patient's functional outcome.   REHAB POTENTIAL: Fair based on previous unsuccessful OPPT episodes, intractable pain and lack of definitive causation of symptoms  CLINICAL DECISION MAKING: Evolving/moderate complexity  EVALUATION COMPLEXITY: Moderate   GOALS: Goals reviewed with patient? No  SHORT TERM GOALS: Target date: 12/02/2023    Patient to demonstrate independence in HEP  Baseline: [id number] Goal status: MET  2.  Able to stand from sitting with single UE support. Baseline: 0 reps 01/05/24: 2 reps with single UE support Goal status: MET   LONG TERM GOALS: Target date: 12/16/2023 --> new date: 02/02/2024  Patient will acknowledge 6/10 pain at least once during episode of care   Baseline: 10/10 01/05/24: the best pain has been 7/10 Goal status: IN PROGRESS  2.  Patient will score at least 13/30 on PSFS to signify  clinically meaningful improvement in functional abilities.   Baseline: 7/30 01/05/24: 14/30 Goal status: MET  3.  Patient will increase 30s chair stand reps from 0 to 2 with/without arms to demonstrate and improved functional ability with less pain/difficulty as well as reduce fall risk.  Baseline: 0 01/05/24: 2 with 1 UE support Goal status: MET  4.  Patient to ambulate 260ft across level ground with LRAD Baseline: 36ft with [facility] 01/05/24: 127ft with SBQC within 2 min Goal status: IN PROGRESS    PLAN:  PT FREQUENCY: 2x/week  PT DURATION: 4 weeks  PLANNED INTERVENTIONS: [id number]- PT  Re-evaluation, 97750- Physical Performance Testing, 97110-Therapeutic exercises, 97530- Therapeutic activity, 97112- Neuromuscular re-education, 97535- Self Care, [id number]- Manual therapy, [id number]- Gait training, [phone]- Aquatic Therapy, [phone]- Electrical stimulation (unattended), [phone] (1-2 muscles), [id number] (3+ muscles)- Dry Needling, Patient/Family education, Balance training, Stair training, Taping, Joint mobilization, Spinal mobilization, Cryotherapy, and Moist heat.  PLAN FOR NEXT SESSION: HEP review and update, manual techniques as appropriate, aerobic tasks, ROM and flexibility activities, strengthening and PREs, TPDN, gait and balance training as needed   Date of referral: 10/30/23 Referring provider: Anders Otto DASEN, MD Referring diagnosis? Unsteadiness on feet (R26.81);Pain Treatment diagnosis? (if different than referring diagnosis) M54.42,G89.29 (ICD-10-CM) - Chronic bilateral low back pain with left-sided sciatica  What was this (referring dx) caused by? Ongoing Issue  Lysle of Condition: Chronic (continuous duration > 3 months)   Laterality: Lt  Current Functional Measure Score: Other PSFS 7/30  Objective measurements identify impairments when they are compared to normal values, the uninvolved extremity, and prior level of function.  [x]  Yes  []  No  Objective assessment of functional ability: Moderate functional limitations   Briefly describe symptoms: 53 y.o. female with severe low back pain and left leg pain, worse with standing, walking, activity  How did symptoms start: chronic  Average pain intensity:  Last 24 hours: 8  Past week: 8  How often does the pt experience symptoms? Constantly  How much have the symptoms interfered with usual daily activities? Quite a bit  How has condition changed since care began at this facility? NA - initial visit  In general, how is the patients overall health? Good   BACK PAIN (STarT Back Screening Tool) Has pain spread down the  leg(s) at some time in the last 2 weeks? y Has there been pain in the shoulder or neck at some time in the last 2 weeks? n Has the pt only walked short distances because of back pain? y Has patient dressed more slowly because of back pain in the past 2 weeks? y Does patient think it's not safe for a person with this condition to be physically active? y Does patient have worrying thoughts a lot of the time? y Does patient feel back pain is terrible and will never get any better? y Has patient stopped enjoying things they usually enjoy? y     Corean Pouch, [place] 01/15/2024, 12:21 PM

## 2024-01-18 ENCOUNTER — Ambulatory Visit

## 2024-01-21 ENCOUNTER — Telehealth: Payer: Self-pay

## 2024-01-21 NOTE — Telephone Encounter (Signed)
 Patient calls nurse line requesting DME order for a rolling walker with seat. She reports that when she was in the hospital last, she was discharged with a standard rolling walker. She states that this does not help her, as when she is out she needs to be able to take breaks and sit down.   Patient received this walker on 11/04/23. Advised patient that insurance may not cover due to her receiving mobility aide in August.   Patient became tearful stating that she doesn't know what to do and that she needs help.   I called member services for [employer] and spoke with representative about coverage for rollator with seat.   I was advised that this would still be covered as long as supporting clinical notes were submitted with order. Patient has met her deductible and OOP max.   Scheduled patient with Dr. Zheng for medical documentation for Rollator with seat.   Chiquita JAYSON English, RN

## 2024-01-22 ENCOUNTER — Ambulatory Visit

## 2024-01-22 ENCOUNTER — Ambulatory Visit: Admitting: Physical Therapy

## 2024-01-22 NOTE — Therapy (Incomplete)
 OUTPATIENT PHYSICAL THERAPY TREATMENT NOTE   Patient Name: [name] MRN: [id number] DOB:1970-04-25, 53 y.o., female Today's Date: 01/22/2024  END OF SESSION:   Past Medical History:  Diagnosis Date   Acute renal failure 05/27/2010   hemodialysis for 6 weeks   Anxiety    Asthma    Depression    Diabetes mellitus    Hypertension    Rhabdomyolysis 06/06/2010   after drug overdose   Suicide attempt by drug ingestion (HCC) 06/05/2010   result rhabdomyolosis and ARF requrining dialysis    SVT (supraventricular tachycardia) 05/28/2021   Past Surgical History:  Procedure Laterality Date   ANKLE ARTHROSCOPY Right 08/17/2015   Procedure: RIGHT ANKLE ARTHROSCOPY WITH SYNOVECTOMY AND LOOSE BODY EXCISION;  Surgeon: Maude Herald, MD;  Location: [facility] OR;  Service: Orthopedics;  Laterality: Right;   CHOLECYSTECTOMY     TUBAL LIGATION  1998   Patient Active Problem List   Diagnosis Date Noted   Neuropathic pain 11/03/2023   Sciatica of left side 11/03/2023   Chronic health problem 11/02/2023   Hypomagnesemia 11/02/2023   SOB (shortness of breath) 11/02/2023   Weakness of both lower limbs 11/02/2023   Acute on chronic congestive heart failure (HCC) 11/01/2023   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   Chronic low back pain with left-sided sciatica 07/08/2022   Chronic systolic CHF (congestive heart failure) (HCC) - LVEF 35-40% by echo 03-2023    Bilateral knee pain 03/14/2019   HLD (hyperlipidemia) 03/19/2017   Asthma 03/14/2017   Major depressive disorder, recurrent severe without psychotic features (HCC) 05/11/2015   Vitamin D  deficiency 04/11/2015   CKD stage 3b, GFR 30-44 ml/min (HCC) - baseline Scr 1.7-2.1 11/06/2010   Essential hypertension, benign 02/03/2007   Hypothyroidism 05/07/2006   T2DM (type 2 diabetes mellitus) (HCC) 05/07/2006   Obesity, Class III, BMI 40-49.9 (morbid obesity) (HCC) 05/07/2006   Iron deficiency anemia 05/07/2006   Obsessive-compulsive disorder 05/07/2006    Generalized anxiety disorder 05/07/2006    PCP: Theophilus Pagan, MD   REFERRING PROVIDER: Anders Otto DASEN, MD  REFERRING DIAG: Unsteadiness on feet (R26.81);Pain  Rationale for Evaluation and Treatment: Rehabilitation  THERAPY DIAG:  No diagnosis found.  ONSET DATE: 16 months  SUBJECTIVE:                                                                                                                                                                                           SUBJECTIVE STATEMENT:   ***  Patient reports some pain, overall doing better. She wants to ride the bike at [employer].  PERTINENT HISTORY:  53 y.o. female with severe low back pain and  left leg pain, worse with standing, walking, activity. We reviewed her imaging noting a widely patent central canal with degenerative changes of facet joints, large effusions/facet hypertrophy. She also has significantly atrophied musculature of lumbar sacral region. We had a long discussion regarding arthritis and weakened muscles and their role in lon back pain. We discussed the role of conservative cares in treating back pain. This includes activity modification, physical therapy, medications including NSAIDs for flares, and injections. We discussed the relapsing nature of back pain. We discussed a spine-healthy lifestyle including a healthy diet, weight control, staying active, and maintaining core strength, stability, and lower extremity flexibility. We discussed that there is generally not a good surgical treatment for axial back pain unless it is associated with a mechanical/structural abnormality, radicular leg pain, weakness, or other neurologic signs/symptoms. We discussed physical therapy to work on balance as well as core strengthening for low back pain. I am providing exercises today.  I also mentioned role of facet injections, peripheral nerve stimulator and multifidus stimulator and how each may benefit her. She will  follow up with Dr Waylon to discuss these options in greater detail.  As she is not a surgical candidate, she does not need a follow up appointment.   PAIN:  Are you having pain? Yes: NPRS scale: 8/10 Pain location: low back, LLE Pain description: ache, radiating Aggravating factors: activity Relieving factors: medication  PRECAUTIONS: None  RED FLAGS: None   WEIGHT BEARING RESTRICTIONS: No  FALLS:  Has patient fallen in last 6 months? Yes. Number of falls 1   OCCUPATION: not working  PLOF: Independent  PATIENT GOALS: To walk independently again  NEXT MD VISIT: TBD  OBJECTIVE:  Note: Objective measures were completed at Evaluation unless otherwise noted.  DIAGNOSTIC FINDINGS:  Multilevel degenerate age, greatest at at L4-L5 where there are  bilateral facet joint effusions with perifacet inflammatory  change/arthropathy and mild bilateral foraminal stenosis.   PATIENT SURVEYS:  Patient-specific activity scoring scheme (Point to one number):  0 represents "unable to perform." 10 represents "able to perform at prior level. 0 1 2 3 4 5 6 7 8 9  10 (Date and Score) Activity Initial  Activity Eval  01/05/24   Standing >30s   2 4   Walking 5 min  0 5  Standing from sitting 5 5  Average score 7/30 23% 14/30 46.6%    Total score = sum of the activity scores/number of activities Minimum detectable change (90%CI) for average score = 2 points Minimum detectable change (90%CI) for single activity score = 3 points PSFS developed by: Rosalee MYRTIS Marvis KYM Charlet CHRISTELLA., & Binkley, J. (1995). Assessing disability and change on individual  patients: a report of a patient specific measure. [employer] [place], 47, 741-736. Reproduced with the permission of the authors   MUSCLE LENGTH: Hamstrings: Right 90 deg; Left 90 deg Thomas test: TBD  POSTURE: UTA to excessive WS to L  PALPATION: deferred  LUMBAR ROM:   AROM eval  Flexion NT  Extension NT  Right  lateral flexion NT  Left lateral flexion NT  Right rotation 75%  Left rotation 75%   (Blank rows = not tested)  LOWER EXTREMITY ROM:     Active  Right eval Left eval  Hip flexion    Hip extension    Hip abduction    Hip adduction    Hip internal rotation 0d 0d  Hip external rotation    Knee flexion    Knee extension  Ankle dorsiflexion    Ankle plantarflexion    Ankle inversion    Ankle eversion     (Blank rows = not tested)  LOWER EXTREMITY MMT:    MMT Right eval Left eval  Hip flexion    Hip extension    Hip abduction    Hip adduction    Hip internal rotation    Hip external rotation    Knee flexion    Knee extension    Ankle dorsiflexion    Ankle plantarflexion    Ankle inversion    Ankle eversion     (Blank rows = not tested)  LUMBAR SPECIAL TESTS:  Straight leg raise test: Negative and Slump test: Positive  FUNCTIONAL TESTS:  (eval) 30 seconds chair stand test 0 reps   (01/05/24)  30 sec chair stand test 2 reps; with 1 UE support  2 min walk test: 1 standing rest break 136' with SBQC  GAIT: Distance walked: 30ftx2 Assistive device utilized: Quad cane small base Level of assistance: Complete Independence Comments: slow cadence , WS L   TREATMENT:      [facility] Adult PT Treatment:                                                DATE: 01/22/24 Aquatic therapy at [facility]- [address] - therapeutic pool temp approximately 91 degrees. Pt enters building in wheelchair aided by family. Treatment took place in water 3.8 to  4 ft 8 in. deep depending upon activity.  Pt entered and exited the pool via stair and handrails with step to pattern. Patient entered water for aquatic therapy for first time and was introduced to principles and therapeutic effects of water as they ambulated and acclimated to pool. Aquatic Exercise: Walking forward/side stepping Standing with UE support edge of pool: Hip abd/add x10 BIL (painful RLE) Hamstring curl x10  BIL Squats x10 Hip IR/ER x10 BIL Heel raises x20  Pt requires the buoyancy of water for active assisted exercises with buoyancy supported for strengthening and AROM exercises. Hydrostatic pressure also supports joints by unweighting joint load by at least 50 % in 3-4 feet depth water. 80% in chest to neck deep water. Water will provide assistance with movement using the current and laminar flow while the buoyancy reduces weight bearing. Pt requires the viscosity of the water for resistance with strengthening exercises.  [facility] Adult PT Treatment:                                                DATE: 01/15/24 Aquatic therapy at [facility]- [address] - therapeutic pool temp approximately 91 degrees. Pt enters building in wheelchair aided by family. Treatment took place in water 3.8 to  4 ft 8 in. deep depending upon activity.  Pt entered and exited the pool via stair and handrails with step to pattern. Patient entered water for aquatic therapy for first time and was introduced to principles and therapeutic effects of water as they ambulated and acclimated to pool. Aquatic Exercise: Walking forward/side stepping Standing with UE support edge of pool: Hip abd/add x10 BIL (painful RLE) Hamstring curl x10 BIL Squats x10 Hip IR/ER x10 BIL Heel raises x20 Hip ext/flex with knee straight x  10 BIL  Pt requires the buoyancy of water for active assisted exercises with buoyancy supported for strengthening and AROM exercises. Hydrostatic pressure also supports joints by unweighting joint load by at least 50 % in 3-4 feet depth water. 80% in chest to neck deep water. Water will provide assistance with movement using the current and laminar flow while the buoyancy reduces weight bearing. Pt requires the viscosity of the water for resistance with strengthening exercises.   [facility] Adult PT Treatment:                                                DATE: 01/05/24 Therapeutic Exercise: Nustep L4-6 x 8 min  intermittent increase Seated figure 4 stretch 2x30 Seated piriformis stretch x30 Seated hamstring stretch x30 Self Care: Self massage with tennis ball along R glute/lumbar paraspinals/QL Updated HEP Therapeutic Activity: Rechecked goals Sitting clams GTB 2x10 Sitting glute iso heel press 5x5   PATIENT EDUCATION:  Education details: Discussed eval findings, rehab rationale and POC and patient is in agreement  Person educated: Patient Education method: Explanation and Handouts Education comprehension: verbalized understanding and needs further education  HOME EXERCISE PROGRAM: Access Code: [email or web address] URL: [email or web address] Date: 01/05/2024 Prepared by: Gellen April Earnie Starring  Exercises - Seated Hamstring Stretch  - 2-3 x daily - 7 x weekly - 2 sets - 1 reps - 33m hold - Seated Figure 4 Piriformis Stretch  - 2-3 x daily - 7 x weekly - 2 sets - 30 sec hold - Sit to Stand with Armchair  - 2 x daily - 5 x weekly - 1 sets - 5 reps - Supine Lower Trunk Rotation  - 1 x daily - 7 x weekly - 3 sets - 10 reps - Supine Bridge  - 1 x daily - 4 x weekly - 2-3 sets - 10 reps - 3s hold - Hooklying Isometric Clamshell  - 1 x daily - 4 x weekly - 2-3 sets - 10 reps - 3 sec hold  ASSESSMENT:  CLINICAL IMPRESSION:  ***  Patient familiar to therapist and aquatic environment, reports some pain but is overall doing well. Session today focused on core and hip strengthening as well as improving standing activity tolerance in the aquatic environment for use of buoyancy to offload joints and the viscosity of water as resistance during therapeutic exercise. She remains very limited by pain, only able to tolerate 30 minutes, but shows determination to continue improving. Patient continues to benefit from skilled PT services on land and aquatic based and should be progressed as able to improve functional independence.    Patient is a 54 y.o. female who was seen today for physical  therapy evaluation and treatment for chronic low back and L radicular symptoms. Patient unable to stand safely to assess trunk ROM as standing tolerance is less than 30s.  Marked mobility restrictions in B hip IR but good hamstring flexibility observed.  Patient understands rationale for rehab and is agreeable to POC   OBJECTIVE IMPAIRMENTS: Abnormal gait, decreased activity tolerance, decreased balance, decreased endurance, decreased knowledge of condition, decreased mobility, difficulty walking, decreased strength, improper body mechanics, postural dysfunction, obesity, and pain.   ACTIVITY LIMITATIONS: carrying, lifting, bending, sitting, standing, squatting, and stairs  PERSONAL FACTORS: Behavior pattern, Fitness, Past/current experiences, and Time since onset of injury/illness/exacerbation are also affecting patient's functional outcome.   REHAB  POTENTIAL: Fair based on previous unsuccessful OPPT episodes, intractable pain and lack of definitive causation of symptoms  CLINICAL DECISION MAKING: Evolving/moderate complexity  EVALUATION COMPLEXITY: Moderate   GOALS: Goals reviewed with patient? No  SHORT TERM GOALS: Target date: 12/02/2023    Patient to demonstrate independence in HEP  Baseline: [id number] Goal status: MET  2.  Able to stand from sitting with single UE support. Baseline: 0 reps 01/05/24: 2 reps with single UE support Goal status: MET   LONG TERM GOALS: Target date: 12/16/2023 --> new date: 02/02/2024  Patient will acknowledge 6/10 pain at least once during episode of care   Baseline: 10/10 01/05/24: the best pain has been 7/10 Goal status: IN PROGRESS  2.  Patient will score at least 13/30 on PSFS to signify clinically meaningful improvement in functional abilities.   Baseline: 7/30 01/05/24: 14/30 Goal status: MET  3.  Patient will increase 30s chair stand reps from 0 to 2 with/without arms to demonstrate and improved functional ability with less  pain/difficulty as well as reduce fall risk.  Baseline: 0 01/05/24: 2 with 1 UE support Goal status: MET  4.  Patient to ambulate 241ft across level ground with LRAD Baseline: 59ft with [facility] 01/05/24: 18ft with SBQC within 2 min Goal status: IN PROGRESS    PLAN:  PT FREQUENCY: 2x/week  PT DURATION: 4 weeks  PLANNED INTERVENTIONS: 97164- PT Re-evaluation, 97750- Physical Performance Testing, 97110-Therapeutic exercises, 97530- Therapeutic activity, 97112- Neuromuscular re-education, 97535- Self Care, [id number]- Manual therapy, [id number]- Gait training, [phone]- Aquatic Therapy, [phone]- Electrical stimulation (unattended), [phone] (1-2 muscles), [id number] (3+ muscles)- Dry Needling, Patient/Family education, Balance training, Stair training, Taping, Joint mobilization, Spinal mobilization, Cryotherapy, and Moist heat.  PLAN FOR NEXT SESSION: HEP review and update, manual techniques as appropriate, aerobic tasks, ROM and flexibility activities, strengthening and PREs, TPDN, gait and balance training as needed   Date of referral: 10/30/23 Referring provider: Anders Otto DASEN, MD Referring diagnosis? Unsteadiness on feet (R26.81);Pain Treatment diagnosis? (if different than referring diagnosis) M54.42,G89.29 (ICD-10-CM) - Chronic bilateral low back pain with left-sided sciatica  What was this (referring dx) caused by? Ongoing Issue  Lysle of Condition: Chronic (continuous duration > 3 months)   Laterality: Lt  Current Functional Measure Score: Other PSFS 7/30  Objective measurements identify impairments when they are compared to normal values, the uninvolved extremity, and prior level of function.  [x]  Yes  []  No  Objective assessment of functional ability: Moderate functional limitations   Briefly describe symptoms: 53 y.o. female with severe low back pain and left leg pain, worse with standing, walking, activity  How did symptoms start: chronic  Average pain intensity:  Last 24 hours:  8  Past week: 8  How often does the pt experience symptoms? Constantly  How much have the symptoms interfered with usual daily activities? Quite a bit  How has condition changed since care began at this facility? NA - initial visit  In general, how is the patients overall health? Good   BACK PAIN (STarT Back Screening Tool) Has pain spread down the leg(s) at some time in the last 2 weeks? y Has there been pain in the shoulder or neck at some time in the last 2 weeks? n Has the pt only walked short distances because of back pain? y Has patient dressed more slowly because of back pain in the past 2 weeks? y Does patient think it's not safe for a person with this condition to be physically active? y Does patient  have worrying thoughts a lot of the time? y Does patient feel back pain is terrible and will never get any better? y Has patient stopped enjoying things they usually enjoy? y     Corean Pouch, [place] 01/22/2024, 8:26 AM

## 2024-01-26 ENCOUNTER — Ambulatory Visit: Admitting: Family Medicine

## 2024-01-26 ENCOUNTER — Ambulatory Visit

## 2024-01-26 ENCOUNTER — Other Ambulatory Visit: Payer: Self-pay | Admitting: Family Medicine

## 2024-01-26 VITALS — BP 120/67 | HR 74 | Ht 71.0 in | Wt 337.8 lb

## 2024-01-26 DIAGNOSIS — M5432 Sciatica, left side: Secondary | ICD-10-CM

## 2024-01-26 DIAGNOSIS — M5431 Sciatica, right side: Secondary | ICD-10-CM

## 2024-01-26 DIAGNOSIS — J45909 Unspecified asthma, uncomplicated: Secondary | ICD-10-CM

## 2024-01-26 NOTE — Patient Instructions (Signed)
 Good to see you today - Thank you for coming in  Things we discussed today:  1) I am sending an order for both a rollator walker and ankle brace. We will have your insurance process the request. This can take a few weeks for the order to be processed.

## 2024-01-26 NOTE — Progress Notes (Signed)
    SUBJECTIVE:   CHIEF COMPLAINT / HPI:   Discussed the use of AI scribe software for clinical note transcription with the patient, who gave verbal consent to proceed.  History of Present Illness [name] is a 53 year old female with sciatica and neuropathy who presents for a walker and ankle brace.  Radicular pain and neuropathy - Sciatica localized to the left side, with pain originating in the lower back and radiating down both legs to the feet - Neuropathy affecting both feet, contributing to impaired mobility - Pain and immobility have resulted in overcompensation with the right side to alleviate burden on the left  Mobility impairment and assistive device needs - Currently uses a cane for ambulation - Provided with a metal walker by the hospital, but finds it impractical for activities such as grocery shopping due to inability to sit and rest when needed - Requests a rollator walker that allows for both pushing and sitting when necessary  Ankle instability and orthotic device needs - Requires a new ankle brace due to current Procare ankle brace being worn out and Velcro not adhering properly  Insurance coverage for durable medical equipment - Insurance covers one item every couple of years and deductible has been met, which may facilitate obtaining both the walker and the ankle brace     PERTINENT  PMH / PSH: polyneuropathy, sciatica, T2DM, asthma  OBJECTIVE:   BP 120/67   Pulse 74   Ht 5' 11 (1.803 m)   Wt (!) 337 lb 12.8 oz (153.2 kg)   SpO2 100%   BMI 47.11 kg/m   General: Alert, pleasant woman. NAD. HEENT: NCAT. MMM. CV: RRR, no murmurs.   Resp: CTAB, no wheezing or crackles. Normal WOB on RA.   Ext: Moves all ext spontaneously. Uses a cane.  Skin: Warm, well perfused   ASSESSMENT/PLAN:   Assessment & Plan Bilateral sciatica Chronic and uncontrolled, at increased risk of falls and uncontrolled pain. It is my medical opinion that [name] would  benefit from a rollator walker to improve her mobility. She would also benefit from a Right ankle brace for pain control. - DME order for rollator and ankle brace    Twyla Nearing, MD [facility]

## 2024-01-26 NOTE — Therapy (Incomplete)
 OUTPATIENT PHYSICAL THERAPY TREATMENT NOTE   Patient Name: [name] MRN: [id number] DOB:12/07/1970, 53 y.o., female Today's Date: 01/26/2024  END OF SESSION:   Past Medical History:  Diagnosis Date   Acute renal failure 05/27/2010   hemodialysis for 6 weeks   Anxiety    Asthma    Depression    Diabetes mellitus    Hypertension    Rhabdomyolysis 06/06/2010   after drug overdose   Suicide attempt by drug ingestion (HCC) 06/05/2010   result rhabdomyolosis and ARF requrining dialysis    SVT (supraventricular tachycardia) 05/28/2021   Past Surgical History:  Procedure Laterality Date   ANKLE ARTHROSCOPY Right 08/17/2015   Procedure: RIGHT ANKLE ARTHROSCOPY WITH SYNOVECTOMY AND LOOSE BODY EXCISION;  Surgeon: Maude Herald, MD;  Location: [facility] OR;  Service: Orthopedics;  Laterality: Right;   CHOLECYSTECTOMY     TUBAL LIGATION  1998   Patient Active Problem List   Diagnosis Date Noted   Neuropathic pain 11/03/2023   Sciatica of left side 11/03/2023   Chronic health problem 11/02/2023   Hypomagnesemia 11/02/2023   SOB (shortness of breath) 11/02/2023   Weakness of both lower limbs 11/02/2023   Acute on chronic congestive heart failure (HCC) 11/01/2023   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   Chronic low back pain with left-sided sciatica 07/08/2022   Chronic systolic CHF (congestive heart failure) (HCC) - LVEF 35-40% by echo 03-2023    Bilateral knee pain 03/14/2019   HLD (hyperlipidemia) 03/19/2017   Asthma 03/14/2017   Major depressive disorder, recurrent severe without psychotic features (HCC) 05/11/2015   Vitamin D  deficiency 04/11/2015   CKD stage 3b, GFR 30-44 ml/min (HCC) - baseline Scr 1.7-2.1 11/06/2010   Essential hypertension, benign 02/03/2007   Hypothyroidism 05/07/2006   T2DM (type 2 diabetes mellitus) (HCC) 05/07/2006   Obesity, Class III, BMI 40-49.9 (morbid obesity) (HCC) 05/07/2006   Iron deficiency anemia 05/07/2006   Obsessive-compulsive disorder 05/07/2006    Generalized anxiety disorder 05/07/2006    PCP: Theophilus Pagan, MD   REFERRING PROVIDER: Anders Otto DASEN, MD  REFERRING DIAG: Unsteadiness on feet (R26.81);Pain  Rationale for Evaluation and Treatment: Rehabilitation  THERAPY DIAG:  No diagnosis found.  ONSET DATE: 16 months  SUBJECTIVE:                                                                                                                                                                                           SUBJECTIVE STATEMENT:   ***  Patient reports some pain, overall doing better. She wants to ride the bike at [employer].  PERTINENT HISTORY:  53 y.o. female with severe low back pain and  left leg pain, worse with standing, walking, activity. We reviewed her imaging noting a widely patent central canal with degenerative changes of facet joints, large effusions/facet hypertrophy. She also has significantly atrophied musculature of lumbar sacral region. We had a long discussion regarding arthritis and weakened muscles and their role in lon back pain. We discussed the role of conservative cares in treating back pain. This includes activity modification, physical therapy, medications including NSAIDs for flares, and injections. We discussed the relapsing nature of back pain. We discussed a spine-healthy lifestyle including a healthy diet, weight control, staying active, and maintaining core strength, stability, and lower extremity flexibility. We discussed that there is generally not a good surgical treatment for axial back pain unless it is associated with a mechanical/structural abnormality, radicular leg pain, weakness, or other neurologic signs/symptoms. We discussed physical therapy to work on balance as well as core strengthening for low back pain. I am providing exercises today.  I also mentioned role of facet injections, peripheral nerve stimulator and multifidus stimulator and how each may benefit her. She will  follow up with Dr Waylon to discuss these options in greater detail.  As she is not a surgical candidate, she does not need a follow up appointment.   PAIN:  Are you having pain? Yes: NPRS scale: 8/10 Pain location: low back, LLE Pain description: ache, radiating Aggravating factors: activity Relieving factors: medication  PRECAUTIONS: None  RED FLAGS: None   WEIGHT BEARING RESTRICTIONS: No  FALLS:  Has patient fallen in last 6 months? Yes. Number of falls 1   OCCUPATION: not working  PLOF: Independent  PATIENT GOALS: To walk independently again  NEXT MD VISIT: TBD  OBJECTIVE:  Note: Objective measures were completed at Evaluation unless otherwise noted.  DIAGNOSTIC FINDINGS:  Multilevel degenerate age, greatest at at L4-L5 where there are  bilateral facet joint effusions with perifacet inflammatory  change/arthropathy and mild bilateral foraminal stenosis.   PATIENT SURVEYS:  Patient-specific activity scoring scheme (Point to one number):  0 represents "unable to perform." 10 represents "able to perform at prior level. 0 1 2 3 4 5 6 7 8 9  10 (Date and Score) Activity Initial  Activity Eval  01/05/24   Standing >30s   2 4   Walking 5 min  0 5  Standing from sitting 5 5  Average score 7/30 23% 14/30 46.6%    Total score = sum of the activity scores/number of activities Minimum detectable change (90%CI) for average score = 2 points Minimum detectable change (90%CI) for single activity score = 3 points PSFS developed by: Rosalee MYRTIS Marvis KYM Charlet CHRISTELLA., & Binkley, J. (1995). Assessing disability and change on individual  patients: a report of a patient specific measure. [employer] [place], 47, 741-736. Reproduced with the permission of the authors   MUSCLE LENGTH: Hamstrings: Right 90 deg; Left 90 deg Thomas test: TBD  POSTURE: UTA to excessive WS to L  PALPATION: deferred  LUMBAR ROM:   AROM eval  Flexion NT  Extension NT  Right  lateral flexion NT  Left lateral flexion NT  Right rotation 75%  Left rotation 75%   (Blank rows = not tested)  LOWER EXTREMITY ROM:     Active  Right eval Left eval  Hip flexion    Hip extension    Hip abduction    Hip adduction    Hip internal rotation 0d 0d  Hip external rotation    Knee flexion    Knee extension  Ankle dorsiflexion    Ankle plantarflexion    Ankle inversion    Ankle eversion     (Blank rows = not tested)  LOWER EXTREMITY MMT:    MMT Right eval Left eval  Hip flexion    Hip extension    Hip abduction    Hip adduction    Hip internal rotation    Hip external rotation    Knee flexion    Knee extension    Ankle dorsiflexion    Ankle plantarflexion    Ankle inversion    Ankle eversion     (Blank rows = not tested)  LUMBAR SPECIAL TESTS:  Straight leg raise test: Negative and Slump test: Positive  FUNCTIONAL TESTS:  (eval) 30 seconds chair stand test 0 reps   (01/05/24)  30 sec chair stand test 2 reps; with 1 UE support  2 min walk test: 1 standing rest break 136' with SBQC  GAIT: Distance walked: 43ftx2 Assistive device utilized: [employer] Level of assistance: Complete Independence Comments: slow cadence , WS L   TREATMENT:      [facility] Adult PT Treatment:                                                DATE: 01/26/24 Therapeutic Exercise: Nustep L4-6 x 8 min intermittent increase Seated figure 4 stretch 2x30 Seated piriformis stretch x30 Seated hamstring stretch x30 Seated pball roll outs fwd/lat Seated pball press down 3 hold 2x10 Therapeutic Activity: Sitting clams GTB 2x10 Sitting glute iso heel press 5x5  [facility] Adult PT Treatment:                                                DATE: 01/15/24 Aquatic therapy at [facility]- [address] - therapeutic pool temp approximately 91 degrees. Pt enters building in wheelchair aided by family. Treatment took place in water 3.8 to  4 ft 8 in. deep depending upon  activity.  Pt entered and exited the pool via stair and handrails with step to pattern. Patient entered water for aquatic therapy for first time and was introduced to principles and therapeutic effects of water as they ambulated and acclimated to pool. Aquatic Exercise: Walking forward/side stepping Standing with UE support edge of pool: Hip abd/add x10 BIL (painful RLE) Hamstring curl x10 BIL Squats x10 Hip IR/ER x10 BIL Heel raises x20 Hip ext/flex with knee straight x 10 BIL  Pt requires the buoyancy of water for active assisted exercises with buoyancy supported for strengthening and AROM exercises. Hydrostatic pressure also supports joints by unweighting joint load by at least 50 % in 3-4 feet depth water. 80% in chest to neck deep water. Water will provide assistance with movement using the current and laminar flow while the buoyancy reduces weight bearing. Pt requires the viscosity of the water for resistance with strengthening exercises.   [facility] Adult PT Treatment:                                                DATE: 01/05/24 Therapeutic Exercise: Nustep L4-6 x 8 min  intermittent increase Seated figure 4 stretch 2x30 Seated piriformis stretch x30 Seated hamstring stretch x30 Self Care: Self massage with tennis ball along R glute/lumbar paraspinals/QL Updated HEP Therapeutic Activity: Rechecked goals Sitting clams GTB 2x10 Sitting glute iso heel press 5x5   PATIENT EDUCATION:  Education details: Discussed eval findings, rehab rationale and POC and patient is in agreement  Person educated: Patient Education method: Explanation and Handouts Education comprehension: verbalized understanding and needs further education  HOME EXERCISE PROGRAM: Access Code: [email or web address] URL: [email or web address] Date: 01/05/2024 Prepared by: Gellen April Earnie Starring  Exercises - Seated Hamstring Stretch  - 2-3 x daily - 7 x weekly - 2 sets - 1 reps - 56m hold - Seated Figure  4 Piriformis Stretch  - 2-3 x daily - 7 x weekly - 2 sets - 30 sec hold - Sit to Stand with Armchair  - 2 x daily - 5 x weekly - 1 sets - 5 reps - Supine Lower Trunk Rotation  - 1 x daily - 7 x weekly - 3 sets - 10 reps - Supine Bridge  - 1 x daily - 4 x weekly - 2-3 sets - 10 reps - 3s hold - Hooklying Isometric Clamshell  - 1 x daily - 4 x weekly - 2-3 sets - 10 reps - 3 sec hold  ASSESSMENT:  CLINICAL IMPRESSION:  ***  Patient familiar to therapist and aquatic environment, reports some pain but is overall doing well. Session today focused on core and hip strengthening as well as improving standing activity tolerance in the aquatic environment for use of buoyancy to offload joints and the viscosity of water as resistance during therapeutic exercise. She remains very limited by pain, only able to tolerate 30 minutes, but shows determination to continue improving. Patient continues to benefit from skilled PT services on land and aquatic based and should be progressed as able to improve functional independence.    Patient is a 53 y.o. female who was seen today for physical therapy evaluation and treatment for chronic low back and L radicular symptoms. Patient unable to stand safely to assess trunk ROM as standing tolerance is less than 30s.  Marked mobility restrictions in B hip IR but good hamstring flexibility observed.  Patient understands rationale for rehab and is agreeable to POC   OBJECTIVE IMPAIRMENTS: Abnormal gait, decreased activity tolerance, decreased balance, decreased endurance, decreased knowledge of condition, decreased mobility, difficulty walking, decreased strength, improper body mechanics, postural dysfunction, obesity, and pain.   ACTIVITY LIMITATIONS: carrying, lifting, bending, sitting, standing, squatting, and stairs  PERSONAL FACTORS: Behavior pattern, Fitness, Past/current experiences, and Time since onset of injury/illness/exacerbation are also affecting patient's  functional outcome.   REHAB POTENTIAL: Fair based on previous unsuccessful OPPT episodes, intractable pain and lack of definitive causation of symptoms  CLINICAL DECISION MAKING: Evolving/moderate complexity  EVALUATION COMPLEXITY: Moderate   GOALS: Goals reviewed with patient? No  SHORT TERM GOALS: Target date: 12/02/2023    Patient to demonstrate independence in HEP  Baseline: [id number] Goal status: MET  2.  Able to stand from sitting with single UE support. Baseline: 0 reps 01/05/24: 2 reps with single UE support Goal status: MET   LONG TERM GOALS: Target date: 12/16/2023 --> new date: 02/02/2024  Patient will acknowledge 6/10 pain at least once during episode of care   Baseline: 10/10 01/05/24: the best pain has been 7/10 Goal status: IN PROGRESS  2.  Patient will score at least 13/30 on PSFS to signify clinically meaningful improvement in  functional abilities.   Baseline: 7/30 01/05/24: 14/30 Goal status: MET  3.  Patient will increase 30s chair stand reps from 0 to 2 with/without arms to demonstrate and improved functional ability with less pain/difficulty as well as reduce fall risk.  Baseline: 0 01/05/24: 2 with 1 UE support Goal status: MET  4.  Patient to ambulate 211ft across level ground with LRAD Baseline: 38ft with [facility] 01/05/24: 131ft with SBQC within 2 min Goal status: IN PROGRESS    PLAN:  PT FREQUENCY: 2x/week  PT DURATION: 4 weeks  PLANNED INTERVENTIONS: 97164- PT Re-evaluation, 97750- Physical Performance Testing, 97110-Therapeutic exercises, 97530- Therapeutic activity, 97112- Neuromuscular re-education, 97535- Self Care, [id number]- Manual therapy, [id number]- Gait training, [phone]- Aquatic Therapy, [phone]- Electrical stimulation (unattended), [phone] (1-2 muscles), [id number] (3+ muscles)- Dry Needling, Patient/Family education, Balance training, Stair training, Taping, Joint mobilization, Spinal mobilization, Cryotherapy, and Moist heat.  PLAN FOR NEXT  SESSION: HEP review and update, manual techniques as appropriate, aerobic tasks, ROM and flexibility activities, strengthening and PREs, TPDN, gait and balance training as needed   Date of referral: 10/30/23 Referring provider: Anders Otto DASEN, MD Referring diagnosis? Unsteadiness on feet (R26.81);Pain Treatment diagnosis? (if different than referring diagnosis) M54.42,G89.29 (ICD-10-CM) - Chronic bilateral low back pain with left-sided sciatica  What was this (referring dx) caused by? Ongoing Issue  Lysle of Condition: Chronic (continuous duration > 3 months)   Laterality: Lt  Current Functional Measure Score: Other PSFS 7/30  Objective measurements identify impairments when they are compared to normal values, the uninvolved extremity, and prior level of function.  [x]  Yes  []  No  Objective assessment of functional ability: Moderate functional limitations   Briefly describe symptoms: 53 y.o. female with severe low back pain and left leg pain, worse with standing, walking, activity  How did symptoms start: chronic  Average pain intensity:  Last 24 hours: 8  Past week: 8  How often does the pt experience symptoms? Constantly  How much have the symptoms interfered with usual daily activities? Quite a bit  How has condition changed since care began at this facility? NA - initial visit  In general, how is the patients overall health? Good   BACK PAIN (STarT Back Screening Tool) Has pain spread down the leg(s) at some time in the last 2 weeks? y Has there been pain in the shoulder or neck at some time in the last 2 weeks? n Has the pt only walked short distances because of back pain? y Has patient dressed more slowly because of back pain in the past 2 weeks? y Does patient think it's not safe for a person with this condition to be physically active? y Does patient have worrying thoughts a lot of the time? y Does patient feel back pain is terrible and will never get any  better? y Has patient stopped enjoying things they usually enjoy? y     Corean Pouch, [place] 01/26/2024, 1:30 PM

## 2024-01-31 ENCOUNTER — Other Ambulatory Visit: Payer: Self-pay | Admitting: Family Medicine

## 2024-01-31 DIAGNOSIS — G8929 Other chronic pain: Secondary | ICD-10-CM

## 2024-02-01 ENCOUNTER — Telehealth: Payer: Self-pay

## 2024-02-01 DIAGNOSIS — M5432 Sciatica, left side: Secondary | ICD-10-CM

## 2024-02-01 NOTE — Telephone Encounter (Signed)
 Community message sent to [employer] for rolator and ankle brace.

## 2024-02-05 ENCOUNTER — Other Ambulatory Visit: Payer: Self-pay | Admitting: Family Medicine

## 2024-02-05 ENCOUNTER — Telehealth: Payer: Self-pay | Admitting: Student

## 2024-02-05 DIAGNOSIS — G8929 Other chronic pain: Secondary | ICD-10-CM

## 2024-02-05 NOTE — Telephone Encounter (Signed)
 Received after hours page. Attempted call x2 but was sent directly to voicemail with no voicemail box set up.   Will attempt to call at a later time.   Damien Pinal, DO [facility], PGY-3 02/05/24 1:59 PM

## 2024-02-08 NOTE — Telephone Encounter (Signed)
 Received the following message from [employer].   Actually we would need a new order because patient is over 300 pounds so she would need a bari rollator so we would need a new order for a bari   Please place new order for bariatric rollator.   Thanks.   Chiquita JAYSON English, RN

## 2024-02-08 NOTE — Telephone Encounter (Signed)
 Patient calls nurse line stating that [employer] never received orders for Rollator. Community message sent to [employer] for update.   Samantha JAYSON English, RN

## 2024-02-08 NOTE — Addendum Note (Signed)
 Addended by: ELICIA HAMLET on: 02/08/2024 04:20 PM   Modules accepted: Orders

## 2024-02-09 ENCOUNTER — Telehealth (HOSPITAL_COMMUNITY): Admitting: Psychiatry

## 2024-02-09 NOTE — Telephone Encounter (Signed)
 Community message sent to [employer] with updated order information.   Chiquita JAYSON English, RN

## 2024-02-10 ENCOUNTER — Telehealth (HOSPITAL_COMMUNITY): Payer: Self-pay

## 2024-02-10 ENCOUNTER — Telehealth (INDEPENDENT_AMBULATORY_CARE_PROVIDER_SITE_OTHER): Admitting: Psychiatry

## 2024-02-10 ENCOUNTER — Encounter (HOSPITAL_COMMUNITY): Payer: Self-pay | Admitting: Psychiatry

## 2024-02-10 ENCOUNTER — Other Ambulatory Visit (HOSPITAL_COMMUNITY): Payer: Self-pay | Admitting: Psychiatry

## 2024-02-10 DIAGNOSIS — F333 Major depressive disorder, recurrent, severe with psychotic symptoms: Secondary | ICD-10-CM

## 2024-02-10 DIAGNOSIS — F411 Generalized anxiety disorder: Secondary | ICD-10-CM

## 2024-02-10 MED ORDER — QUETIAPINE FUMARATE 150 MG PO TABS: 100.0000 mg | ORAL_TABLET | Freq: Every day | ORAL | 1 refills | Status: DC

## 2024-02-10 MED ORDER — MIRTAZAPINE 15 MG PO TABS: 15.0000 mg | ORAL_TABLET | Freq: Every day | ORAL | 1 refills | Status: DC

## 2024-02-10 MED ORDER — CARIPRAZINE HCL 1.5 MG PO CAPS: 1.5000 mg | ORAL_CAPSULE | Freq: Every day | ORAL | 1 refills | Status: DC

## 2024-02-10 MED ORDER — HYDROXYZINE HCL 50 MG PO TABS: 50.0000 mg | ORAL_TABLET | Freq: Three times a day (TID) | ORAL | 3 refills | Status: DC | PRN

## 2024-02-10 MED ORDER — FLUOXETINE HCL 40 MG PO CAPS: 40.0000 mg | ORAL_CAPSULE | Freq: Every day | ORAL | 3 refills | Status: DC

## 2024-02-10 MED ORDER — QUETIAPINE FUMARATE 150 MG PO TABS: 150.0000 mg | ORAL_TABLET | Freq: Every day | ORAL | 1 refills | Status: DC

## 2024-02-10 NOTE — Telephone Encounter (Signed)
 Patient's pharmacy called needing clarification of Seroquel  prescription. Per order 150 mg. Directions states take 100 mg.   Call back number  [phone]

## 2024-02-10 NOTE — Telephone Encounter (Signed)
 Provider reordered Seroquel  150 mg to be taken nightly.  Provider also reordered mirtazapine  15 mg to be taken nightly.  New prescription sent to preferred pharmacy.

## 2024-02-10 NOTE — Progress Notes (Signed)
 [facility] MD/PA/NP OP Progress Note Virtual Visit via Video Note  I connected with [name] on 02/10/24 at  2:00 PM EST by a video enabled telemedicine application and verified that I am speaking with the correct person using two identifiers.  Location: Patient:Home Provider: Clinic   I discussed the limitations of evaluation and management by telemedicine and the availability of in person appointments. The patient expressed understanding and agreed to proceed.  I provided 30 minutes of non-face-to-face time during this encounter.       02/10/2024 12:38 PM [name]  MRN:  [id number]  Chief Complaint: The holiday was tough  HPI: 53 year old female seen today for follow up psychiatric evaluation. She has a psychiatric history of  OCD, adjustment disorder, anxiety, depression, SI, and bipolar disorder. She is currently being managed on Vraylar  1.5 mg, Prozac  40 mg daily, Seroquel  150 mg nightly, Gabapentin  300 mg three times daily as needed (prescribed by PCP),  Vistaril  50 mg three times daily, and mirtazapine  15 mg at bedtime. She informed clinical research associate that her medications are effective in managing her psychiatric conditions.   Today patient is well groomed, pleasant, and engaged in conversation. Patient notes that the holidays have been rough. She informed clinical research associate that she struggles mentally. She informed clinical research associate that she continues to grieve the loss of her son who has passed 10 years ago.  She reports that his birthday is this coming Friday.  She describes being sad and attempting to cope by occupying her mind to prevent becoming suicidal like she did in the past. She has been trying to do puzzles and focus on family (her deceased sons children).  Patient asked writer how she should cooking his birthday.  Provider recommended writing her son a note, spending time with her family, and not isolating.  She reports that she will try.  Patient notes that she continues to have physical pain.  She quantifies her pain as 8/10. She reports that it is difficult for her to stand over 30 seconds. She does find oxycodone  somewhat effective.  Patient notes that she worked in [employer] for over 25 years. 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 She notes that she utilizes a walker, cooks sitting down, and showers with a shower chair.  Patient notes that the above worsens her anxiety and depression. Today provider conducted a GAD 7 and patient scored 8. Provider also conducted a PHQ 9 and patient scored a 10. She notes that she sleeps 5 to 6 hours.  She does endorse having an adequate appetite.  Today she endorses passive SI but denies wanting to harm herself.  She reports that she is going to try to live for her deceased son's shoulder so that is Legacy remains alive.  She denies SI/HI/VH, mania, or paranoia.  No medication changes made today.  Patient agreeable to continue medication as prescribed.  Patient requested that the supply of the medication.  Provider was agreeable.  No other concerns at this time.     Visit Diagnosis:    ICD-10-CM   1. Generalized anxiety disorder  F41.1 hydrOXYzine  (ATARAX ) 50 MG tablet    mirtazapine  (REMERON ) 15 MG tablet    QUEtiapine  Fumarate 150 MG TABS    2. Severe recurrent major depressive disorder with psychotic features (HCC)  F33.3 FLUoxetine  (PROZAC ) 40 MG capsule    mirtazapine  (REMERON )  15 MG tablet    QUEtiapine  Fumarate 150 MG TABS    cariprazine  (VRAYLAR ) 1.5 MG capsule               Past Psychiatric History: OCD, adjustment disorder, anxiety, depression, SI, and bipolar disorder.   Past Medical History:  Past Medical History:  Diagnosis Date   Acute renal failure 05/27/2010   hemodialysis for 6 weeks   Anxiety    Asthma    Depression    Diabetes mellitus    Hypertension    Rhabdomyolysis 06/06/2010    after drug overdose   Suicide attempt by drug ingestion (HCC) 06/05/2010   result rhabdomyolosis and ARF requrining dialysis    SVT (supraventricular tachycardia) 05/28/2021    Past Surgical History:  Procedure Laterality Date   ANKLE ARTHROSCOPY Right 08/17/2015   Procedure: RIGHT ANKLE ARTHROSCOPY WITH SYNOVECTOMY AND LOOSE BODY EXCISION;  Surgeon: Maude Herald, MD;  Location: [facility] OR;  Service: Orthopedics;  Laterality: Right;   CHOLECYSTECTOMY     TUBAL LIGATION  1998    Family Psychiatric History: Unknown  Family History:  Family History  Problem Relation Age of Onset   Asthma Father     Social History:  Social History   Socioeconomic History   Marital status: Married    Spouse name: Not on file   Number of children: 2   Years of education: Not on file   Highest education level: Not on file  Occupational History   Occupation: research officer, trade union  Tobacco Use   Smoking status: Never    Passive exposure: Never   Smokeless tobacco: Never  Vaping Use   Vaping status: Never Used  Substance and Sexual Activity   Alcohol use: No   Drug use: No   Sexual activity: Yes    Comment: with husband   Other Topics Concern   Not on file  Social History Narrative   Married high school boyfriend at age 71, two boys, still married but he has been living with other women for years.She works 2 jobs, as a research officer, 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   Pt not working    Pt lives with family    Social Drivers of Corporate Investment Banker Strain: High Risk (06/19/2023)   Overall Financial Resource Strain (CARDIA)    Difficulty of Paying Living Expenses: Very hard  Food Insecurity: Food Insecurity Present (11/03/2023)   Hunger Vital Sign    Worried About Running Out of Food in the Last Year: Sometimes true    Ran Out of Food in the Last Year: Sometimes true  Transportation Needs: Unmet Transportation Needs (11/03/2023)   PRAPARE - Scientist, Research (physical Sciences) (Medical): Yes    Lack of Transportation (Non-Medical): Yes  Physical Activity: Not on file  Stress: Not on file  Social Connections: Not on file    Allergies:  Allergies  Allergen Reactions   Ace Inhibitors Other (See Comments)    Patient had acute renal failure requiring hemodialysis after a suicide attempt.  Nephro recommended avoiding use.   Haldol  [Haloperidol  Lactate] Other (See Comments)    Tardive dyskinesia   Nsaids Other (See Comments)    Nephro recommended avoiding after acute renal failure requiring hemodialysis associated with a suicide attempt.   Entresto  [Sacubitril-Valsartan] Itching   Latex Other (See Comments)    Rash around IV site    Metabolic Disorder Labs: Lab Results  Component Value Date   HGBA1C 9.7 (H) 11/02/2023  MPG 231.69 11/02/2023   MPG 174.29 07/09/2022   No results found for: PROLACTIN Lab Results  Component Value Date   CHOL 196 12/04/2023   TRIG 179 (H) 12/04/2023   HDL 45 12/04/2023   CHOLHDL 4.4 12/04/2023   VLDL 46 (H) 04/10/2015   LDLCALC 119 (H) 12/04/2023   LDLCALC 156 (H) 08/10/2023   Lab Results  Component Value Date   TSH 1.060 12/04/2023   TSH 17.394 (H) 11/02/2023    Therapeutic Level Labs: No results found for: LITHIUM No results found for: VALPROATE No results found for: CBMZ  Current Medications: Current Outpatient Medications  Medication Sig Dispense Refill   albuterol  (VENTOLIN  HFA) 108 (90 Base) MCG/ACT inhaler INHALE 2 PUFFS BY MOUTH EVERY 6 HOURS AS NEEDED FOR SHORTNESS OF BREATH 18 g 5   benztropine  (COGENTIN ) 0.5 MG tablet Take 1 tablet (0.5 mg total) by mouth 2 (two) times daily. 60 tablet 3   Black Cohosh 450 MG CAPS Take 450 mg by mouth at bedtime.     cariprazine  (VRAYLAR ) 1.5 MG capsule Take 1 capsule (1.5 mg total) by mouth daily. 90 capsule 1   ferrous sulfate  325 (65 FE) MG tablet Take 1 tablet (325 mg total) by mouth daily with breakfast. 30 tablet 2   FLUoxetine  (PROZAC )  40 MG capsule Take 1 capsule (40 mg total) by mouth daily. 30 capsule 3   fluticasone  (FLONASE ) 50 MCG/ACT nasal spray Place 1 spray into both nostrils daily. 1 spray in each nostril every day 16 g 1   fluticasone -salmeterol (WIXELA INHUB) 250-50 MCG/ACT AEPB Inhale 1 puff into the lungs in the morning and at bedtime. 60 each 1   furosemide  (LASIX ) 40 MG tablet Take 1 tablet (40 mg total) by mouth daily. 90 tablet 1   gabapentin  (NEURONTIN ) 300 MG capsule Take 2 capsules (600 mg total) by mouth 3 (three) times daily. 180 capsule 0   hydrOXYzine  (ATARAX ) 50 MG tablet Take 1 tablet (50 mg total) by mouth 3 (three) times daily as needed for anxiety. 90 tablet 3   ipratropium-albuterol  (DUONEB) 0.5-2.5 (3) MG/3ML SOLN Take 3 mLs by nebulization 3 (three) times daily as needed. 360 mL 3   isosorbide -hydrALAZINE  (BIDIL ) 20-37.5 MG tablet Take 1 tablet by mouth 3 (three) times daily. 180 tablet 2   LANTUS  SOLOSTAR 100 UNIT/ML Solostar Pen INJECT 65 UNITS SUBCUTANEOUSLY TWICE DAILY (Patient taking differently: Inject 65 Units into the skin 2 (two) times daily at 8 am and 10 pm.) 15 mL 5   levothyroxine  (SYNTHROID ) 300 MCG tablet Take 1 tablet (300 mcg total) by mouth daily at 6 (six) AM. 90 tablet 0   lidocaine  4 % Place 1 patch onto the skin daily.     Lidocaine -Menthol (CBD4 FREEZE PUMP VANISH SCENT) 4-1 % CREA Apply 1 Application topically daily as needed (pain).     losartan  (COZAAR ) 25 MG tablet Take 1 tablet (25 mg total) by mouth at bedtime. 30 tablet 1   methocarbamol  (ROBAXIN ) 500 MG tablet TAKE 2 TABLETS BY MOUTH THREE TIMES DAILY 180 tablet 0   metoprolol  succinate (TOPROL -XL) 50 MG 24 hr tablet Take 1 tablet (50 mg total) by mouth at bedtime. Take with or immediately following a meal. 90 tablet 3   mirtazapine  (REMERON ) 15 MG tablet Take 1 tablet (15 mg total) by mouth at bedtime. Decrease by half in 2 weeks and then discontinue.  Weight gaining medication. 90 tablet 1   NOVOLOG  FLEXPEN 100  UNIT/ML FlexPen INJECT 15 UNITS  SUBCUTANEOUSLY WITH LUNCH , AND THEN INJECT 20 UNITS WITH SUPPER 15 mL 5   oxyCODONE -acetaminophen  (PERCOCET/ROXICET) 5-325 MG tablet Take 1 tablet by mouth every 6 (six) hours as needed for severe pain (pain score 7-10).     pantoprazole  (PROTONIX ) 40 MG tablet Take 1 tablet (40 mg total) by mouth daily. 90 tablet 1   QUEtiapine  Fumarate 150 MG TABS Take 100 mg by mouth at bedtime. 90 tablet 1   rosuvastatin  (CRESTOR ) 20 MG tablet Take 1 tablet (20 mg total) by mouth daily. 90 tablet 3   tirzepatide  (MOUNJARO ) 15 MG/0.5ML Pen Inject 15 mg into the skin once a week. 2 mL 3   Current Facility-Administered Medications  Medication Dose Route Frequency Provider Last Rate Last Admin   methylPREDNISolone  acetate (DEPO-MEDROL ) injection 80 mg  80 mg Intramuscular Once Sater, Charlie LABOR, MD         Musculoskeletal: Strength & Muscle Tone: within normal limits, telehealth visit Gait & Station: normal, telehealth visit Patient leans: N/A  Psychiatric Specialty Exam: Review of Systems  There were no vitals taken for this visit.There is no height or weight on file to calculate BMI.  General Appearance: Well Groomed  Eye Contact:  Good  Speech:  Clear and Coherent and Normal Rate  Volume:  Normal  Mood:  Euthymic,   Affect:  Appropriate and Congruent  Thought Process:  Coherent, Goal Directed and Linear  Orientation:  Full (Time, Place, and Person)  Thought Content: WDL and Logical   Suicidal Thoughts:  Yes.  without intent/plan  Homicidal Thoughts:  No  Memory:  Immediate;   Good Recent;   Good Remote;   Good  Judgement:  Good  Insight:  Good and Fair  Psychomotor Activity:  Normal  Concentration:  Concentration: Good and Attention Span: Good  Recall:  Good  Fund of Knowledge: Good  Language: Good  Akathisia:  No  Handed:  Right  AIMS (if indicated): Not done  Assets:  Communication Skills Desire for Improvement Financial  Resources/Insurance Housing Social Support  ADL's:  Intact  Cognition: WNL  Sleep:  Fair   Screenings: AIMS    Flowsheet Row Admission (Discharged) from 02/24/2022 in [facility] INPATIENT BEHAVIORAL MEDICINE  AIMS Total Score 0   AUDIT    Flowsheet Row Admission (Discharged) from 02/24/2022 in [facility] INPAT[facility]AL MEDICINE  Alcohol Use Disorder Identification Test Final Score (AUDIT) 0   GAD-7    Flowsheet Row Video Visit from 02/10/2024 in [facility] Video Visit from 08/05/2023 in [facility] Video Visit from 05/21/2023 in [facility] Video Visit from 04/23/2023 in [facility] Video Visit from 02/20/2023 in [facility]  Total GAD-7 Score 8 6 13 21 8    PHQ2-9    Flowsheet Row Video Visit from 02/10/2024 in [facility] Office Visit from 12/04/2023 in [facility] [place] 05/21/2023 in [facility] Video Visit from 04/23/2023 in [facility]  PHQ-2 Total Score 4 6 2 2 6   PHQ-9 Total Score 10 23 2 3 23    Flowsheet Row ED to Hosp-Admission (Discharged) from 11/01/2023 in [facility] 3E HF PCU ED from 07/07/2023 in [facility] Emergency Department at [facility] Video Visit from 05/21/2023 in [facility]  C-SSRS RISK CATEGORY No Risk No Risk High Risk     Assessment and Plan: Patient reports that her anxiety and depression has improved since her last visit.  She does note that she continues to grieve the loss of her son's birthday is this coming Friday.  Provider recommend patient not isolate and spend time with family on her deceased son's birthday.No medication changes made today.  Patient  agreeable to continue medication as prescribed.  Patient requested 90-day supply of medication.  Provider was agreeable.   1. Generalized anxiety disorder  Continue- hydrOXYzine  (ATARAX ) 50 MG tablet; Take 1 tablet (50 mg total) by mouth 3 (three) times daily as needed for anxiety.  Dispense: 90 tablet; Refill: 3 Continue- mirtazapine  (REMERON ) 15 MG tablet; Take 1 tablet (15 mg total) by mouth at bedtime. Decrease by half in 2 weeks and then discontinue.  Weight gaining medication.  Dispense: 90 tablet; Refill: 1 Continue- QUEtiapine  Fumarate 150 MG TABS; Take 100 mg by mouth at bedtime.  Dispense: 90 tablet; Refill: 1  2. Severe recurrent major depressive disorder with psychotic features (HCC)  Continue- FLUoxetine  (PROZAC ) 40 MG capsule; Take 1 capsule (40 mg total) by mouth daily.  Dispense: 30 capsule; Refill: 3 Continue- mirtazapine  (REMERON ) 15 MG tablet; Take 1 tablet (15 mg total) by mouth at bedtime. Decrease by half in 2 weeks and then discontinue.  Weight gaining medication.  Dispense: 90 tablet; Refill: 1 Continue- QUEtiapine  Fumarate 150 MG TABS; Take 100 mg by mouth at bedtime.  Dispense: 90 tablet; Refill: 1 Continue- cariprazine  (VRAYLAR ) 1.5 MG capsule; Take 1 capsule (1.5 mg total) by mouth daily.  Dispense: 90 capsule; Refill: 1    Follow-up in 2.5 months  Zane FORBES Bach, NP 02/10/2024, 12:38 PM

## 2024-02-11 NOTE — Therapy (Deleted)
 OUTPATIENT PHYSICAL THERAPY TREATMENT NOTE   Patient Name: [name] MRN: [id number] DOB:[birth year], 53 y.o., female Today's Date: 02/11/2024  END OF SESSION:    Past Medical History:  Diagnosis Date   Acute renal failure 05/27/2010   hemodialysis for 6 weeks   Anxiety    Asthma    Depression    Diabetes mellitus    Hypertension    Rhabdomyolysis 06/06/2010   after drug overdose   Suicide attempt by drug ingestion (HCC) 06/05/2010   result rhabdomyolosis and ARF requrining dialysis    SVT (supraventricular tachycardia) 05/28/2021   Past Surgical History:  Procedure Laterality Date   ANKLE ARTHROSCOPY Right 08/17/2015   Procedure: RIGHT ANKLE ARTHROSCOPY WITH SYNOVECTOMY AND LOOSE BODY EXCISION;  Surgeon: Maude Herald, MD;  Location: [facility] OR;  Service: Orthopedics;  Laterality: Right;   CHOLECYSTECTOMY     TUBAL LIGATION  1998   Patient Active Problem List   Diagnosis Date Noted   Neuropathic pain 11/03/2023   Sciatica of left side 11/03/2023   Chronic health problem 11/02/2023   Hypomagnesemia 11/02/2023   SOB (shortness of breath) 11/02/2023   Weakness of both lower limbs 11/02/2023   Acute on chronic congestive heart failure (HCC) 11/01/2023   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   Chronic low back pain with left-sided sciatica 07/08/2022   Chronic systolic CHF (congestive heart failure) (HCC) - LVEF 35-40% by echo 03-2023    Bilateral knee pain 03/14/2019   HLD (hyperlipidemia) 03/19/2017   Asthma 03/14/2017   Major depressive disorder, recurrent severe without psychotic features (HCC) 05/11/2015   Vitamin D  deficiency 04/11/2015   CKD stage 3b, GFR 30-44 ml/min (HCC) - baseline Scr 1.7-2.1 11/06/2010   Essential hypertension, benign 02/03/2007   Hypothyroidism 05/07/2006   T2DM (type 2 diabetes mellitus) (HCC) 05/07/2006   Obesity, Class III, BMI 40-49.9 (morbid obesity) (HCC) 05/07/2006   Iron deficiency anemia 05/07/2006   Obsessive-compulsive disorder  05/07/2006   Generalized anxiety disorder 05/07/2006    PCP: Theophilus Pagan, MD   REFERRING PROVIDER: Anders Otto DASEN, MD  REFERRING DIAG: Unsteadiness on feet (R26.81);Pain  Rationale for Evaluation and Treatment: Rehabilitation  THERAPY DIAG:  No diagnosis found.  ONSET DATE: 16 months  SUBJECTIVE:                                                                                                                                                                                           SUBJECTIVE STATEMENT:   Patient reports some pain, overall doing better. She wants to ride the bike at [employer].  PERTINENT HISTORY:  53 y.o. female with severe low back pain and left  leg pain, worse with standing, walking, activity. We reviewed her imaging noting a widely patent central canal with degenerative changes of facet joints, large effusions/facet hypertrophy. She also has significantly atrophied musculature of lumbar sacral region. We had a long discussion regarding arthritis and weakened muscles and their role in lon back pain. We discussed the role of conservative cares in treating back pain. This includes activity modification, physical therapy, medications including NSAIDs for flares, and injections. We discussed the relapsing nature of back pain. We discussed a spine-healthy lifestyle including a healthy diet, weight control, staying active, and maintaining core strength, stability, and lower extremity flexibility. We discussed that there is generally not a good surgical treatment for axial back pain unless it is associated with a mechanical/structural abnormality, radicular leg pain, weakness, or other neurologic signs/symptoms. We discussed physical therapy to work on balance as well as core strengthening for low back pain. I am providing exercises today.  I also mentioned role of facet injections, peripheral nerve stimulator and multifidus stimulator and how each may benefit her. She  will follow up with Dr Waylon to discuss these options in greater detail.  As she is not a surgical candidate, she does not need a follow up appointment.   PAIN:  Are you having pain? Yes: NPRS scale: 8/10 Pain location: low back, LLE Pain description: ache, radiating Aggravating factors: activity Relieving factors: medication  PRECAUTIONS: None  RED FLAGS: None   WEIGHT BEARING RESTRICTIONS: No  FALLS:  Has patient fallen in last 6 months? Yes. Number of falls 1   OCCUPATION: not working  PLOF: Independent  PATIENT GOALS: To walk independently again  NEXT MD VISIT: TBD  OBJECTIVE:  Note: Objective measures were completed at Evaluation unless otherwise noted.  DIAGNOSTIC FINDINGS:  Multilevel degenerate age, greatest at at L4-L5 where there are  bilateral facet joint effusions with perifacet inflammatory  change/arthropathy and mild bilateral foraminal stenosis.   PATIENT SURVEYS:  Patient-specific activity scoring scheme (Point to one number):  0 represents "unable to perform." 10 represents "able to perform at prior level. 0 1 2 3 4 5 6 7 8 9  10 (Date and Score) Activity Initial  Activity Eval  01/05/24   Standing >30s   2 4   Walking 5 min  0 5  Standing from sitting 5 5  Average score 7/30 23% 14/30 46.6%    Total score = sum of the activity scores/number of activities Minimum detectable change (90%CI) for average score = 2 points Minimum detectable change (90%CI) for single activity score = 3 points PSFS developed by: Rosalee MYRTIS Marvis KYM Charlet CHRISTELLA., & Binkley, J. (1995). Assessing disability and change on individual  patients: a report of a patient specific measure. [employer] [place], 47, 741-736. Reproduced with the permission of the authors     MUSCLE LENGTH: Hamstrings: Right 90 deg; Left 90 deg Thomas test: TBD  POSTURE: UTA to excessive WS to L  PALPATION: deferred  LUMBAR ROM:   AROM eval  Flexion NT  Extension NT   Right lateral flexion NT  Left lateral flexion NT  Right rotation 75%  Left rotation 75%   (Blank rows = not tested)  LOWER EXTREMITY ROM:     Active  Right eval Left eval  Hip flexion    Hip extension    Hip abduction    Hip adduction    Hip internal rotation 0d 0d  Hip external rotation    Knee flexion    Knee extension  Ankle dorsiflexion    Ankle plantarflexion    Ankle inversion    Ankle eversion     (Blank rows = not tested)  LOWER EXTREMITY MMT:    MMT Right eval Left eval  Hip flexion    Hip extension    Hip abduction    Hip adduction    Hip internal rotation    Hip external rotation    Knee flexion    Knee extension    Ankle dorsiflexion    Ankle plantarflexion    Ankle inversion    Ankle eversion     (Blank rows = not tested)  LUMBAR SPECIAL TESTS:  Straight leg raise test: Negative and Slump test: Positive  FUNCTIONAL TESTS:  (eval) 30 seconds chair stand test 0 reps   (01/05/24)  30 sec chair stand test 2 reps; with 1 UE support  2 min walk test: 1 standing rest break 136' with SBQC  GAIT: Distance walked: 18ftx2 Assistive device utilized: Quad cane small base Level of assistance: Complete Independence Comments: slow cadence , WS L   TREATMENT:      [facility] Adult PT Treatment:                                                DATE: 01/15/24 Aquatic therapy at [facility]- [address] - therapeutic pool temp approximately 91 degrees. Pt enters building in wheelchair aided by family. Treatment took place in water 3.8 to  4 ft 8 in. deep depending upon activity.  Pt entered and exited the pool via stair and handrails with step to pattern. Patient entered water for aquatic therapy for first time and was introduced to principles and therapeutic effects of water as they ambulated and acclimated to pool. Aquatic Exercise: Walking forward/side stepping Standing with UE support edge of pool: Hip abd/add x10 BIL (painful RLE) Hamstring curl  x10 BIL Squats x10 Hip IR/ER x10 BIL Heel raises x20 Hip ext/flex with knee straight x 10 BIL  Pt requires the buoyancy of water for active assisted exercises with buoyancy supported for strengthening and AROM exercises. Hydrostatic pressure also supports joints by unweighting joint load by at least 50 % in 3-4 feet depth water. 80% in chest to neck deep water. Water will provide assistance with movement using the current and laminar flow while the buoyancy reduces weight bearing. Pt requires the viscosity of the water for resistance with strengthening exercises.   [facility] Adult PT Treatment:                                                DATE: 01/05/24 Therapeutic Exercise: Nustep L4-6 x 8 min intermittent increase Seated figure 4 stretch 2x30 Seated piriformis stretch x30 Seated hamstring stretch x30 Self Care: Self massage with tennis ball along R glute/lumbar paraspinals/QL Updated HEP Therapeutic Activity: Rechecked goals Sitting clams GTB 2x10 Sitting glute iso heel press 5x5   [facility] Adult PT Treatment:                                                DATE: 12/29/23 Therapeutic Exercise: Laurene  L4 8 min Neuromuscular re-ed: Supine hip fallouts GTB 15x B, 15/15  S/L clams GTB 15/15 Bridge with GTB 15x Therapeutic Activity: Seated hamstring stretch 30s x2 B Supine QL stretch 30s x2 B (pain!) Supine p-ball curl ups 15x B 15/15   PATIENT EDUCATION:  Education details: Discussed eval findings, rehab rationale and POC and patient is in agreement  Person educated: Patient Education method: Explanation and Handouts Education comprehension: verbalized understanding and needs further education  HOME EXERCISE PROGRAM: Access Code: [email or web address] URL: [email or web address] Date: 01/05/2024 Prepared by: Gellen April Marie Nonato  Exercises - Seated Hamstring Stretch  - 2-3 x daily - 7 x weekly - 2 sets - 1 reps - 78m hold - Seated Figure 4 Piriformis Stretch  - 2-3 x  daily - 7 x weekly - 2 sets - 30 sec hold - Sit to Stand with Armchair  - 2 x daily - 5 x weekly - 1 sets - 5 reps - Supine Lower Trunk Rotation  - 1 x daily - 7 x weekly - 3 sets - 10 reps - Supine Bridge  - 1 x daily - 4 x weekly - 2-3 sets - 10 reps - 3s hold - Hooklying Isometric Clamshell  - 1 x daily - 4 x weekly - 2-3 sets - 10 reps - 3 sec hold  ASSESSMENT:  CLINICAL IMPRESSION:  Patient familiar to therapist and aquatic environment, reports some pain but is overall doing well. Session today focused on core and hip strengthening as well as improving standing activity tolerance in the aquatic environment for use of buoyancy to offload joints and the viscosity of water as resistance during therapeutic exercise. She remains very limited by pain, only able to tolerate 30 minutes, but shows determination to continue improving. Patient continues to benefit from skilled PT services on land and aquatic based and should be progressed as able to improve functional independence.    Patient is a 53 y.o. female who was seen today for physical therapy evaluation and treatment for chronic low back and L radicular symptoms. Patient unable to stand safely to assess trunk ROM as standing tolerance is less than 30s.  Marked mobility restrictions in B hip IR but good hamstring flexibility observed.  Patient understands rationale for rehab and is agreeable to POC   OBJECTIVE IMPAIRMENTS: Abnormal gait, decreased activity tolerance, decreased balance, decreased endurance, decreased knowledge of condition, decreased mobility, difficulty walking, decreased strength, improper body mechanics, postural dysfunction, obesity, and pain.   ACTIVITY LIMITATIONS: carrying, lifting, bending, sitting, standing, squatting, and stairs  PERSONAL FACTORS: Behavior pattern, Fitness, Past/current experiences, and Time since onset of injury/illness/exacerbation are also affecting patient's functional outcome.   REHAB POTENTIAL:  Fair based on previous unsuccessful OPPT episodes, intractable pain and lack of definitive causation of symptoms  CLINICAL DECISION MAKING: Evolving/moderate complexity  EVALUATION COMPLEXITY: Moderate   GOALS: Goals reviewed with patient? No  SHORT TERM GOALS: Target date: 12/02/2023    Patient to demonstrate independence in HEP  Baseline: [id number] Goal status: MET  2.  Able to stand from sitting with single UE support. Baseline: 0 reps 01/05/24: 2 reps with single UE support Goal status: MET   LONG TERM GOALS: Target date: 12/16/2023 --> new date: 02/02/2024  Patient will acknowledge 6/10 pain at least once during episode of care   Baseline: 10/10 01/05/24: the best pain has been 7/10 Goal status: IN PROGRESS  2.  Patient will score at least 13/30 on PSFS to signify clinically meaningful improvement in functional  abilities.   Baseline: 7/30 01/05/24: 14/30 Goal status: MET  3.  Patient will increase 30s chair stand reps from 0 to 2 with/without arms to demonstrate and improved functional ability with less pain/difficulty as well as reduce fall risk.  Baseline: 0 01/05/24: 2 with 1 UE support Goal status: MET  4.  Patient to ambulate 224ft across level ground with LRAD Baseline: 80ft with [facility] 01/05/24: 163ft with SBQC within 2 min Goal status: IN PROGRESS    PLAN:  PT FREQUENCY: 2x/week  PT DURATION: 4 weeks  PLANNED INTERVENTIONS: 97164- PT Re-evaluation, 97750- Physical Performance Testing, 97110-Therapeutic exercises, 97530- Therapeutic activity, 97112- Neuromuscular re-education, 97535- Self Care, 02859- Manual therapy, [id number]- Gait training, [phone]- Aquatic Therapy, [phone]- Electrical stimulation (unattended), [phone] (1-2 muscles), 20561 (3+ muscles)- Dry Needling, Patient/Family education, Balance training, Stair training, Taping, Joint mobilization, Spinal mobilization, Cryotherapy, and Moist heat.  PLAN FOR NEXT SESSION: HEP review and update, manual  techniques as appropriate, aerobic tasks, ROM and flexibility activities, strengthening and PREs, TPDN, gait and balance training as needed   Date of referral: 10/30/23 Referring provider: Anders Otto DASEN, MD Referring diagnosis? Unsteadiness on feet (R26.81);Pain Treatment diagnosis? (if different than referring diagnosis) M54.42,G89.29 (ICD-10-CM) - Chronic bilateral low back pain with left-sided sciatica  What was this (referring dx) caused by? Ongoing Issue  Lysle of Condition: Chronic (continuous duration > 3 months)   Laterality: Lt  Current Functional Measure Score: Other PSFS 7/30  Objective measurements identify impairments when they are compared to normal values, the uninvolved extremity, and prior level of function.  [x]  Yes  []  No  Objective assessment of functional ability: Moderate functional limitations   Briefly describe symptoms: 53 y.o. female with severe low back pain and left leg pain, worse with standing, walking, activity  How did symptoms start: chronic  Average pain intensity:  Last 24 hours: 8  Past week: 8  How often does the pt experience symptoms? Constantly  How much have the symptoms interfered with usual daily activities? Quite a bit  How has condition changed since care began at this facility? NA - initial visit  In general, how is the patients overall health? Good   BACK PAIN (STarT Back Screening Tool) Has pain spread down the leg(s) at some time in the last 2 weeks? y Has there been pain in the shoulder or neck at some time in the last 2 weeks? n Has the pt only walked short distances because of back pain? y Has patient dressed more slowly because of back pain in the past 2 weeks? y Does patient think it's not safe for a person with this condition to be physically active? y Does patient have worrying thoughts a lot of the time? y Does patient feel back pain is terrible and will never get any better? y Has patient stopped enjoying  things they usually enjoy? y     Reyes CHRISTELLA Kohut, PT 02/11/2024, 9:21 AM

## 2024-02-12 ENCOUNTER — Ambulatory Visit

## 2024-02-18 ENCOUNTER — Ambulatory Visit

## 2024-02-22 ENCOUNTER — Ambulatory Visit: Admitting: Family Medicine

## 2024-02-22 VITALS — BP 123/84 | HR 99

## 2024-02-22 DIAGNOSIS — L03115 Cellulitis of right lower limb: Secondary | ICD-10-CM

## 2024-02-22 DIAGNOSIS — D509 Iron deficiency anemia, unspecified: Secondary | ICD-10-CM

## 2024-02-22 DIAGNOSIS — E1122 Type 2 diabetes mellitus with diabetic chronic kidney disease: Secondary | ICD-10-CM

## 2024-02-22 MED ORDER — CEPHALEXIN 500 MG PO CAPS: 500.0000 mg | ORAL_CAPSULE | Freq: Three times a day (TID) | ORAL | 0 refills | Status: AC

## 2024-02-22 NOTE — Patient Instructions (Addendum)
 It was wonderful to see you today! Thank you for choosing [facility].   Please bring ALL of your medications with you to every visit.   Today we talked about:  I am concerned that you have an infection in the skin in your right foot that is worsening your pain.  Please take the antibiotic Keflex  3 times per day for the next 7 days and I will see you for follow-up on Wednesday morning to make sure it is getting better.  Unfortunately as we discussed you are high risk given your diabetes with elevated A1c.  If you develop worsening pain that ascends up your leg with redness and warmth or fevers and nausea and vomiting please go to the hospital for evaluation.  Please elevate your foot is much as possible and you can use ice to help keep the swelling down. We are going to draw some blood work today to check on your diabetes, kidney function and blood cell counts, I will follow-up with those results on Wednesday.  Please follow up in Wednesday 12/17 for reevaluation   We are checking some labs today. If they are abnormal, I will call you. If they are normal, I will send you a MyChart message (if it is active) or a letter in the mail. If you do not hear about your labs in the next 2 weeks, please call the office.  Call the clinic at [phone] if your symptoms worsen or you have any concerns.  Please be sure to schedule follow up at the front desk before you leave today.   Izetta Nap, DO Family Medicine

## 2024-02-22 NOTE — Progress Notes (Unsigned)
° ° °  SUBJECTIVE:   CHIEF COMPLAINT / HPI:   Discussed the use of AI scribe software for clinical note transcription with the patient, who gave verbal consent to proceed.  Right foot pain and swelling - Onset of pain and swelling in the third and middle toes of one foot since Thursday - Worsening swelling, redness, and pain over several days - Severe pain with difficulty ambulating - Swelling limited to the foot - Decreased sensation in both feet, diagnosed with peripheral neuropathy in the setting of T2DM - Follows with pain management takes oxycodone -acetaminophen  5-325 mg up to four times daily, usually three times daily - Takes gabapentin  - No fever, vomiting, or chest pain  Diabetes Current Regimen: Mounjaro  15 mg weekly, NovoLog  15-20, Lantus  65 units twice daily.  Reports good compliance with her medications recently. CBGs: Not checking Last A1c:  Lab Results  Component Value Date   HGBA1C 7.3 (H) 02/22/2024   Last Eye Exam: Has not obtained due to other ongoing medical issues Statin: Rosuvastatin  20 mg daily ACE/ARB: Losartan  25 mg daily  PERTINENT  PMH / PSH: HTN, CHF, asthma, hypothyroidism, T2DM, chronic back pain, CKD 4, bipolar depression, HLD, IDA  OBJECTIVE:   BP 123/84   Pulse 99   SpO2 100%    General: NAD, pleasant, able to participate in exam Cardiac: RRR, no murmurs. Respiratory: CTAB, normal effort, No wheezes, rales or rhonchi Extremities: No peripheral edema noted Bilateral feet: Right foot with marked edema and erythema compared to left and on dorsal surface.  Significant pain with exam, unable to manipulate toes or ankle secondary to pain.  2+ dorsalis pedis pulse and posterior tibialis pulses bilaterally.  Absent sensation in distal toes bilaterally.  Pinpoint scab present on dorsal surface of third digit on right foot without purulent drainage.  Skin: warm and dry. Neuro: alert, no obvious focal deficits Psych: Fluctuating mood, can alternate  between crying to shouting in pain  ASSESSMENT/PLAN:   Assessment & Plan Type 2 diabetes mellitus with stage 4 chronic kidney disease, with long-term current use of insulin  (HCC) Prior A1c 9.7 in 10/2023, will obtain repeat today.  Not checking CBGs reports compliance with current regimen.  Will continue and follow-up medication adjustments. -A1c, BMP Iron deficiency anemia, unspecified iron deficiency anemia type Hemoglobin stable and blood work in 11/2023, will continue monitoring recommend continuing oral iron supplementation. -CBC, ferritin Cellulitis of right lower extremity Exam is consistent with superficial infection, likely secondary to trauma of third digit on right foot without ascending infection.  No other systemic symptoms and does not localize over her joint.  High risk given uncontrolled T2DM as above, discussed options and patient adamant about not going back to the ED for evaluation.  Will trial oral antibiotic therapy and follow-up in 48 hours to assess for improvement.  ED return precautions discussed. - Keflex  3 times daily x 7 days - Scheduled on 12/17 for repeat evaluation - ED return precautions extensively discussed - Continue pain management with chronic opioids as prescribed by pain clinic     Dr. Izetta Nap, DO [facility]

## 2024-02-23 ENCOUNTER — Inpatient Hospital Stay (HOSPITAL_COMMUNITY)
Admission: EM | Admit: 2024-02-23 | Discharge: 2024-02-26 | DRG: 638 | Disposition: A | Discharge status: Home / Self Care | Source: Ambulatory Visit

## 2024-02-23 ENCOUNTER — Other Ambulatory Visit: Payer: Self-pay

## 2024-02-23 ENCOUNTER — Telehealth: Payer: Self-pay

## 2024-02-23 ENCOUNTER — Ambulatory Visit (HOSPITAL_COMMUNITY): Admission: EM | Admit: 2024-02-23 | Discharge: 2024-02-23 | Disposition: A | Discharge status: Home / Self Care

## 2024-02-23 ENCOUNTER — Ambulatory Visit: Payer: Self-pay | Admitting: Family Medicine

## 2024-02-23 ENCOUNTER — Emergency Department (HOSPITAL_COMMUNITY)

## 2024-02-23 ENCOUNTER — Encounter (HOSPITAL_COMMUNITY): Payer: Self-pay

## 2024-02-23 DIAGNOSIS — Z7985 Long-term (current) use of injectable non-insulin antidiabetic drugs: Secondary | ICD-10-CM

## 2024-02-23 DIAGNOSIS — Z7951 Long term (current) use of inhaled steroids: Secondary | ICD-10-CM

## 2024-02-23 DIAGNOSIS — F419 Anxiety disorder, unspecified: Secondary | ICD-10-CM | POA: Diagnosis present

## 2024-02-23 DIAGNOSIS — F319 Bipolar disorder, unspecified: Secondary | ICD-10-CM | POA: Diagnosis present

## 2024-02-23 DIAGNOSIS — E1169 Type 2 diabetes mellitus with other specified complication: Principal | ICD-10-CM | POA: Diagnosis present

## 2024-02-23 DIAGNOSIS — Z91040 Latex allergy status: Secondary | ICD-10-CM

## 2024-02-23 DIAGNOSIS — Z79899 Other long term (current) drug therapy: Secondary | ICD-10-CM

## 2024-02-23 DIAGNOSIS — M869 Osteomyelitis, unspecified: Secondary | ICD-10-CM | POA: Diagnosis present

## 2024-02-23 DIAGNOSIS — M5432 Sciatica, left side: Secondary | ICD-10-CM | POA: Diagnosis present

## 2024-02-23 DIAGNOSIS — E039 Hypothyroidism, unspecified: Secondary | ICD-10-CM | POA: Diagnosis present

## 2024-02-23 DIAGNOSIS — N184 Chronic kidney disease, stage 4 (severe): Secondary | ICD-10-CM | POA: Diagnosis present

## 2024-02-23 DIAGNOSIS — Z886 Allergy status to analgesic agent status: Secondary | ICD-10-CM

## 2024-02-23 DIAGNOSIS — D631 Anemia in chronic kidney disease: Secondary | ICD-10-CM | POA: Diagnosis present

## 2024-02-23 DIAGNOSIS — L03115 Cellulitis of right lower limb: Secondary | ICD-10-CM | POA: Diagnosis present

## 2024-02-23 DIAGNOSIS — E875 Hyperkalemia: Secondary | ICD-10-CM | POA: Diagnosis present

## 2024-02-23 DIAGNOSIS — I130 Hypertensive heart and chronic kidney disease with heart failure and stage 1 through stage 4 chronic kidney disease, or unspecified chronic kidney disease: Secondary | ICD-10-CM | POA: Diagnosis present

## 2024-02-23 DIAGNOSIS — E1122 Type 2 diabetes mellitus with diabetic chronic kidney disease: Secondary | ICD-10-CM | POA: Diagnosis present

## 2024-02-23 DIAGNOSIS — Z79890 Hormone replacement therapy (postmenopausal): Secondary | ICD-10-CM

## 2024-02-23 DIAGNOSIS — Z888 Allergy status to other drugs, medicaments and biological substances status: Secondary | ICD-10-CM

## 2024-02-23 DIAGNOSIS — E1141 Type 2 diabetes mellitus with diabetic mononeuropathy: Secondary | ICD-10-CM | POA: Diagnosis present

## 2024-02-23 DIAGNOSIS — J45909 Unspecified asthma, uncomplicated: Secondary | ICD-10-CM | POA: Diagnosis present

## 2024-02-23 DIAGNOSIS — Z794 Long term (current) use of insulin: Secondary | ICD-10-CM

## 2024-02-23 DIAGNOSIS — R443 Hallucinations, unspecified: Secondary | ICD-10-CM | POA: Diagnosis present

## 2024-02-23 DIAGNOSIS — I5022 Chronic systolic (congestive) heart failure: Secondary | ICD-10-CM | POA: Diagnosis present

## 2024-02-23 DIAGNOSIS — G2401 Drug induced subacute dyskinesia: Secondary | ICD-10-CM | POA: Diagnosis present

## 2024-02-23 LAB — CBC WITH DIFFERENTIAL/PLATELET
Abs Immature Granulocytes: 0.03 K/uL (ref 0.00–0.07)
Basophils Absolute: 0 K/uL (ref 0.0–0.1)
Basophils Relative: 1 %
Eosinophils Absolute: 0.3 K/uL (ref 0.0–0.5)
Eosinophils Relative: 3 %
HCT: 34.1 % — ABNORMAL LOW (ref 36.0–46.0)
Hemoglobin: 10.3 g/dL — ABNORMAL LOW (ref 12.0–15.0)
Immature Granulocytes: 0 %
Lymphocytes Relative: 26 %
Lymphs Abs: 2 K/uL (ref 0.7–4.0)
MCH: 26.3 pg (ref 26.0–34.0)
MCHC: 30.2 g/dL (ref 30.0–36.0)
MCV: 87.2 fL (ref 80.0–100.0)
Monocytes Absolute: 0.6 K/uL (ref 0.1–1.0)
Monocytes Relative: 7 %
Neutro Abs: 4.9 K/uL (ref 1.7–7.7)
Neutrophils Relative %: 63 %
Platelets: 394 K/uL (ref 150–400)
RBC: 3.91 MIL/uL (ref 3.87–5.11)
RDW: 12.6 % (ref 11.5–15.5)
WBC: 7.9 K/uL (ref 4.0–10.5)
nRBC: 0 % (ref 0.0–0.2)

## 2024-02-23 LAB — CBC
Hematocrit: 32 % — ABNORMAL LOW (ref 34.0–46.6)
Hemoglobin: 10.1 g/dL — ABNORMAL LOW (ref 11.1–15.9)
MCH: 27.2 pg (ref 26.6–33.0)
MCHC: 31.6 g/dL (ref 31.5–35.7)
MCV: 86 fL (ref 79–97)
Platelets: 388 x10E3/uL (ref 150–450)
RBC: 3.72 x10E6/uL — ABNORMAL LOW (ref 3.77–5.28)
RDW: 12.3 % (ref 11.7–15.4)
WBC: 8.7 x10E3/uL (ref 3.4–10.8)

## 2024-02-23 LAB — BASIC METABOLIC PANEL WITH GFR
Anion gap: 11 (ref 5–15)
BUN/Creatinine Ratio: 18 (ref 9–23)
BUN: 41 mg/dL — ABNORMAL HIGH (ref 6–24)
BUN: 44 mg/dL — ABNORMAL HIGH (ref 6–20)
CO2: 24 mmol/L (ref 20–29)
CO2: 24 mmol/L (ref 22–32)
Calcium: 9.3 mg/dL (ref 8.7–10.2)
Calcium: 9.4 mg/dL (ref 8.9–10.3)
Chloride: 105 mmol/L (ref 96–106)
Chloride: 107 mmol/L (ref 98–111)
Creatinine, Ser: 2.34 mg/dL — ABNORMAL HIGH (ref 0.57–1.00)
Creatinine, Ser: 2.4 mg/dL — ABNORMAL HIGH (ref 0.44–1.00)
GFR, Estimated: 23 mL/min — ABNORMAL LOW (ref >60)
Glucose, Bld: 143 mg/dL — ABNORMAL HIGH (ref 70–99)
Glucose: 113 mg/dL — ABNORMAL HIGH (ref 70–99)
Potassium: 5.1 mmol/L (ref 3.5–5.2)
Potassium: 5.3 mmol/L — ABNORMAL HIGH (ref 3.5–5.1)
Sodium: 140 mmol/L (ref 134–144)
Sodium: 142 mmol/L (ref 135–145)
eGFR: 24 mL/min/1.73 — ABNORMAL LOW (ref >59)

## 2024-02-23 LAB — I-STAT CG4 LACTIC ACID, ED: Lactic Acid, Venous: 0.5 mmol/L (ref 0.5–1.9)

## 2024-02-23 LAB — HEMOGLOBIN A1C
Est. average glucose Bld gHb Est-mCnc: 163 mg/dL
Hgb A1c MFr Bld: 7.3 % — ABNORMAL HIGH (ref 4.8–5.6)

## 2024-02-23 LAB — FERRITIN: Ferritin: 35 ng/mL (ref 15–150)

## 2024-02-23 MED ORDER — OXYCODONE-ACETAMINOPHEN 5-325 MG PO TABS
1.0000 | ORAL_TABLET | Freq: Once | ORAL | Status: AC
  Administered 2024-02-23: 19:00:00 1 via ORAL
  Filled 2024-02-23: qty 1

## 2024-02-23 NOTE — Assessment & Plan Note (Signed)
 Prior A1c 9.7 in 10/2023, will obtain repeat today.  Not checking CBGs reports compliance with current regimen.  Will continue and follow-up medication adjustments. -A1c, BMP

## 2024-02-23 NOTE — ED Notes (Signed)
 Patient is being discharged from the Urgent Care and sent to the Emergency Department via POV . Per [facility], patient is in need of higher level of care due to right foot pain. Patient is aware and verbalizes understanding of plan of care.  Vitals:   02/23/24 1705  BP: 117/83  Pulse: 96  Resp: 18  Temp: 98.6 F (37 C)  SpO2: 100%

## 2024-02-23 NOTE — Telephone Encounter (Signed)
 Patient calls nurse line in regards to foot trauma.   She reports her foot is now black. She reports she is very worried and reports she feels she needs to be evaluated today vs waiting until tomorrow morning.   She reports significant pain to the area. She denies any fevers or chills. No drainage.   Advised to send a picture via mychart. She reports she is unable to do this.   Patient advised to go to the emergency department or urgent care for evaluation.   Patient agreed with plan.

## 2024-02-23 NOTE — ED Notes (Signed)
 Quick triage note: Pt to ED from UC c/o right foot injury, unclear when injury occurred, but pt reports increase in swelling , pain, and discoloration to foot. Pt is a DM

## 2024-02-23 NOTE — Assessment & Plan Note (Signed)
 Hemoglobin stable and blood work in 11/2023, will continue monitoring recommend continuing oral iron supplementation. -CBC, ferritin

## 2024-02-23 NOTE — ED Provider Triage Note (Signed)
 Emergency Medicine Provider Triage Evaluation Note  [name] , a 53 y.o. female  was evaluated in triage.  Pt complains of right foot pain.  Patient notes that on Thursday she felt that her third toe on her right foot appeared discolored, she is unaware of any injury that occurred but since that time she has had progressive pain to the dorsal aspect of her right foot with associated erythema/warmth/swelling.  Patient has had subjective fevers at home and reports that she is seeing things that are not there, like snakes in my bedroom.  She was seen urgent care today and instructed to come to the emergency department  Review of Systems  Positive: As above Negative: As above  Physical Exam  BP 126/76   Pulse 92   Temp 98.3 F (36.8 C)   Resp 18   SpO2 98%  Gen:   Awake, no distress   Resp:  Normal effort  MSK:   Moves extremities without difficulty  Other:  Unable to tolerate even light palpation of the dorsal aspect of the right foot, notable erythema/warmth/swelling with a dusky appearance of the third toe, there is a break in skin integrity at the plantar aspect of the crease of the third toe, unable to tolerate palpation of pedal pulses in the right foot  Medical Decision Making  Medically screening exam initiated at 6:16 PM.  Appropriate orders placed.  [name] was informed that the remainder of the evaluation will be completed by another provider, this initial triage assessment does not replace that evaluation, and the importance of remaining in the ED until their evaluation is complete.  Concern for possible osteomyelitis versus soft tissue skin infection like cellulitis/erysipelas   Samantha Rocky SAILOR, PA-C 02/23/24 1820

## 2024-02-23 NOTE — ED Triage Notes (Signed)
 Pt states she was advised by her pcp to come to urgent care of ED for right foot pain and discoloration. She was seen last week and is taking scheduled pain meds at home without relief.    Provider in triage with pt

## 2024-02-23 NOTE — ED Triage Notes (Signed)
 Pt sent from urgent care for further evaluation of R foot pain x 5 days, discoloration to toes midfoot; endorses fevers at home; hx DM  Pt gives verbal consent for mse

## 2024-02-23 NOTE — ED Triage Notes (Signed)
 Seen briefly in triage by this provider. Patient with 5 day history of right foot pain, reports severe. She is not sure if she stubbed her toe or not. having discoloration of the toes into the midfoot. she is concerned about infection as she is diabetic and has decreased sensation in the feet. has tried oxycodone  from pain clinic. tylenol  not helpful. Avoids NSAIDs due to kidney function (Cr 2.34, GFR 24). She does not tolerate even light palpation of the dorsal foot and grabs this providers hand to stop further examination. The skin is erythematous and dusky in appearance. There is limited sensation distally in the toes, reports this is baseline. The middle toe has distal tenderness that patient does not tolerate, and darker appearance of skin. Cap refill assessed great toe only, and she does not tolerate any pedal pulse palpation.  Discussed with patient there is limited I can do for her pain in the urgent care. I did offer xray to evaluate bony structures, however patient would like something for pain and would rather have a full workup done in the ED. Her son is here and will transport her to the ED across the parking lot     Vitals:   02/23/24 1723  BP: 126/76  Pulse: 92  Resp: 18  Temp: 98.3 F (36.8 C)  SpO2: 98%    Samantha Tukker Byrns PA [facility] 17:34 PM

## 2024-02-24 ENCOUNTER — Observation Stay (HOSPITAL_COMMUNITY)

## 2024-02-24 ENCOUNTER — Ambulatory Visit: Payer: Self-pay

## 2024-02-24 DIAGNOSIS — L03115 Cellulitis of right lower limb: Secondary | ICD-10-CM | POA: Insufficient documentation

## 2024-02-24 DIAGNOSIS — E1121 Type 2 diabetes mellitus with diabetic nephropathy: Secondary | ICD-10-CM

## 2024-02-24 DIAGNOSIS — L03119 Cellulitis of unspecified part of limb: Principal | ICD-10-CM

## 2024-02-24 DIAGNOSIS — L0390 Cellulitis, unspecified: Secondary | ICD-10-CM | POA: Diagnosis present

## 2024-02-24 LAB — HIV ANTIBODY (ROUTINE TESTING W REFLEX): HIV Screen 4th Generation wRfx: NONREACTIVE

## 2024-02-24 LAB — GLUCOSE, CAPILLARY
Glucose-Capillary: 198 mg/dL — ABNORMAL HIGH (ref 70–99)
Glucose-Capillary: 147 mg/dL — ABNORMAL HIGH (ref 70–99)

## 2024-02-24 LAB — I-STAT CG4 LACTIC ACID, ED
Lactic Acid, Venous: 2.2 mmol/L (ref 0.5–1.9)
Lactic Acid, Venous: 1.1 mmol/L (ref 0.5–1.9)

## 2024-02-24 LAB — CBG MONITORING, ED: Glucose-Capillary: 185 mg/dL — ABNORMAL HIGH (ref 70–99)

## 2024-02-24 MED ORDER — FLUTICASONE PROPIONATE 50 MCG/ACT NA SUSP
1.0000 | Freq: Every day | NASAL | Status: DC
  Filled 2024-02-24 (×2): qty 16

## 2024-02-24 MED ORDER — GABAPENTIN 300 MG PO CAPS: 600.0000 mg | ORAL_CAPSULE | Freq: Three times a day (TID) | ORAL | Status: DC

## 2024-02-24 MED ORDER — BISACODYL 10 MG RE SUPP
10.0000 mg | Freq: Once | RECTAL | Status: AC
  Administered 2024-02-24: 10 mg via RECTAL
  Filled 2024-02-24: qty 1

## 2024-02-24 MED ORDER — FLUTICASONE FUROATE-VILANTEROL 200-25 MCG/ACT IN AEPB
1.0000 | INHALATION_SPRAY | Freq: Every day | RESPIRATORY_TRACT | Status: DC
  Administered 2024-02-25 – 2024-02-26 (×2): 1 via RESPIRATORY_TRACT
  Filled 2024-02-24: qty 28

## 2024-02-24 MED ORDER — BENZTROPINE MESYLATE 0.5 MG PO TABS: 0.5000 mg | ORAL_TABLET | Freq: Two times a day (BID) | ORAL | Status: DC

## 2024-02-24 MED ORDER — ALBUTEROL SULFATE (2.5 MG/3ML) 0.083% IN NEBU: 2.5000 mg | INHALATION_SOLUTION | RESPIRATORY_TRACT | Status: DC | PRN

## 2024-02-24 MED ORDER — SODIUM CHLORIDE 0.9 % IV SOLN
3.0000 g | Freq: Once | INTRAVENOUS | Status: DC
  Filled 2024-02-24: qty 8

## 2024-02-24 MED ORDER — GABAPENTIN 100 MG PO CAPS
100.0000 mg | ORAL_CAPSULE | Freq: Three times a day (TID) | ORAL | Status: DC
  Administered 2024-02-24 – 2024-02-26 (×6): 100 mg via ORAL
  Filled 2024-02-24 (×6): qty 1

## 2024-02-24 MED ORDER — LIDOCAINE 5 % EX PTCH
1.0000 | MEDICATED_PATCH | CUTANEOUS | Status: DC
  Administered 2024-02-24 – 2024-02-26 (×3): 1 via TRANSDERMAL
  Filled 2024-02-24 (×3): qty 1

## 2024-02-24 MED ORDER — OXYCODONE HCL 5 MG PO TABS
5.0000 mg | ORAL_TABLET | Freq: Four times a day (QID) | ORAL | Status: DC | PRN
  Administered 2024-02-24 – 2024-02-26 (×4): 5 mg via ORAL
  Filled 2024-02-24 (×4): qty 1

## 2024-02-24 MED ORDER — QUETIAPINE FUMARATE 50 MG PO TABS
150.0000 mg | ORAL_TABLET | Freq: Every day | ORAL | Status: DC
  Administered 2024-02-24 – 2024-02-25 (×2): 150 mg via ORAL
  Filled 2024-02-24 (×3): qty 3
  Filled 2024-02-24: qty 6

## 2024-02-24 MED ORDER — CEFAZOLIN SODIUM-DEXTROSE 2-4 GM/100ML-% IV SOLN: 2.0000 g | Freq: Three times a day (TID) | INTRAVENOUS | Status: DC

## 2024-02-24 MED ORDER — SENNA 8.6 MG PO TABS
1.0000 | ORAL_TABLET | Freq: Two times a day (BID) | ORAL | Status: DC
  Administered 2024-02-24 – 2024-02-26 (×4): 8.6 mg via ORAL
  Filled 2024-02-24 (×4): qty 1

## 2024-02-24 MED ORDER — BENZTROPINE MESYLATE 0.5 MG PO TABS
0.5000 mg | ORAL_TABLET | Freq: Two times a day (BID) | ORAL | Status: DC
  Administered 2024-02-24 – 2024-02-26 (×5): 0.5 mg via ORAL
  Filled 2024-02-24 (×6): qty 1

## 2024-02-24 MED ORDER — SODIUM CHLORIDE 0.9 % IV SOLN
3.0000 g | Freq: Four times a day (QID) | INTRAVENOUS | Status: DC
  Administered 2024-02-24 – 2024-02-25 (×4): 3 g via INTRAVENOUS
  Filled 2024-02-24 (×4): qty 8

## 2024-02-24 MED ORDER — FUROSEMIDE 40 MG PO TABS
40.0000 mg | ORAL_TABLET | Freq: Every day | ORAL | Status: DC
  Administered 2024-02-24 – 2024-02-26 (×3): 40 mg via ORAL
  Filled 2024-02-24: qty 1
  Filled 2024-02-24: qty 2
  Filled 2024-02-24: qty 1

## 2024-02-24 MED ORDER — INSULIN GLARGINE 100 UNIT/ML ~~LOC~~ SOLN
20.0000 [IU] | Freq: Two times a day (BID) | SUBCUTANEOUS | Status: DC
  Administered 2024-02-24 – 2024-02-26 (×5): 20 [IU] via SUBCUTANEOUS
  Filled 2024-02-24 (×7): qty 0.2

## 2024-02-24 MED ORDER — LOSARTAN POTASSIUM 50 MG PO TABS: 25.0000 mg | ORAL_TABLET | Freq: Every day | ORAL | Status: DC

## 2024-02-24 MED ORDER — FLUOXETINE HCL 20 MG PO CAPS
40.0000 mg | ORAL_CAPSULE | Freq: Every day | ORAL | Status: DC
  Administered 2024-02-24 – 2024-02-26 (×3): 40 mg via ORAL
  Filled 2024-02-24 (×3): qty 2

## 2024-02-24 MED ORDER — ENOXAPARIN SODIUM 80 MG/0.8ML IJ SOSY
80.0000 mg | PREFILLED_SYRINGE | Freq: Every day | INTRAMUSCULAR | Status: DC
  Administered 2024-02-24 – 2024-02-26 (×3): 80 mg via SUBCUTANEOUS
  Filled 2024-02-24 (×3): qty 0.8

## 2024-02-24 MED ORDER — ALBUTEROL SULFATE HFA 108 (90 BASE) MCG/ACT IN AERS: 1.0000 | INHALATION_SPRAY | RESPIRATORY_TRACT | Status: DC | PRN

## 2024-02-24 MED ORDER — INSULIN ASPART 100 UNIT/ML IJ SOLN
0.0000 [IU] | Freq: Three times a day (TID) | INTRAMUSCULAR | Status: DC
  Administered 2024-02-24: 19:00:00 3 [IU] via SUBCUTANEOUS
  Administered 2024-02-25: 13:00:00 2 [IU] via SUBCUTANEOUS
  Administered 2024-02-25 – 2024-02-26 (×4): 3 [IU] via SUBCUTANEOUS
  Filled 2024-02-24 (×2): qty 3
  Filled 2024-02-24 (×2): qty 2
  Filled 2024-02-24 (×2): qty 3

## 2024-02-24 MED ORDER — CARIPRAZINE HCL 1.5 MG PO CAPS
1.5000 mg | ORAL_CAPSULE | Freq: Every day | ORAL | Status: DC
  Administered 2024-02-24 – 2024-02-26 (×3): 1.5 mg via ORAL
  Filled 2024-02-24 (×3): qty 1

## 2024-02-24 MED ORDER — METOPROLOL SUCCINATE ER 50 MG PO TB24
50.0000 mg | ORAL_TABLET | Freq: Every day | ORAL | Status: DC
  Administered 2024-02-24 – 2024-02-25 (×2): 50 mg via ORAL
  Filled 2024-02-24 (×2): qty 1

## 2024-02-24 MED ORDER — FERROUS SULFATE 325 (65 FE) MG PO TABS
325.0000 mg | ORAL_TABLET | Freq: Every day | ORAL | Status: DC
  Administered 2024-02-25 – 2024-02-26 (×2): 325 mg via ORAL
  Filled 2024-02-24 (×2): qty 1

## 2024-02-24 MED ORDER — ISOSORB DINITRATE-HYDRALAZINE 20-37.5 MG PO TABS
1.0000 | ORAL_TABLET | Freq: Three times a day (TID) | ORAL | Status: DC
  Administered 2024-02-24 – 2024-02-26 (×7): 1 via ORAL
  Filled 2024-02-24 (×9): qty 1

## 2024-02-24 MED ORDER — PANTOPRAZOLE SODIUM 40 MG PO TBEC
40.0000 mg | DELAYED_RELEASE_TABLET | Freq: Every day | ORAL | Status: DC
  Administered 2024-02-24 – 2024-02-26 (×3): 40 mg via ORAL
  Filled 2024-02-24 (×3): qty 1

## 2024-02-24 MED ORDER — ACETAMINOPHEN 500 MG PO TABS
1000.0000 mg | ORAL_TABLET | Freq: Four times a day (QID) | ORAL | Status: DC
  Administered 2024-02-24 – 2024-02-26 (×7): 1000 mg via ORAL
  Filled 2024-02-24 (×8): qty 2

## 2024-02-24 MED ORDER — ROSUVASTATIN CALCIUM 20 MG PO TABS
20.0000 mg | ORAL_TABLET | Freq: Every day | ORAL | Status: DC
  Administered 2024-02-24 – 2024-02-26 (×3): 20 mg via ORAL
  Filled 2024-02-24 (×3): qty 1

## 2024-02-24 MED ORDER — MIRTAZAPINE 15 MG PO TABS: 15.0000 mg | ORAL_TABLET | Freq: Every day | ORAL | Status: DC

## 2024-02-24 MED ORDER — LINEZOLID 600 MG PO TABS
600.0000 mg | ORAL_TABLET | Freq: Once | ORAL | Status: DC
  Filled 2024-02-24: qty 1

## 2024-02-24 MED ORDER — LEVOTHYROXINE SODIUM 75 MCG PO TABS
300.0000 ug | ORAL_TABLET | Freq: Every day | ORAL | Status: DC
  Administered 2024-02-24 – 2024-02-26 (×3): 300 ug via ORAL
  Filled 2024-02-24: qty 4
  Filled 2024-02-24: qty 3
  Filled 2024-02-24: qty 4

## 2024-02-24 NOTE — H&P (Addendum)
 Hospital Admission History and Physical Service Pager: [phone]  Patient name: [name] Medical record number: [id number] Date of Birth: 1971/02/01 Age: 53 y.o. Gender: female  Primary Care Provider: Theophilus Pagan, MD Consultants: None Code Status: Full   Preferred Emergency Contact:  Contact Information     Name Relation Home Work Mobile   [place] Sister   [phone]      Other Contacts   None on File     Chief Complaint: R foot pain   Differential and Medical Decision Making:  [name] is a 53 y.o. female presenting with pain and swelling of right foot..  Differential includes cellulitis, PVD, and DVT. Also considered clinical stage of Charcot foot. Cellulitis is most likely given appearance, diabetic neuropathy. PVD is considered given hx of T2DM and could have contributed to cellulitis risk. DVT less likely, but will obtain DVT US  to rule out. Was only on Keflex  x2days as pain was worsening, so unclear if this a keflex  failure. Reassuringly, VSS, foot neurovascularly intact, afebrile, and imaging negative for osteomyelitis. No c/f sepsis at this time.   Of note, some concern for Charcot's pathology given hx of DMII. Will order MRI foot to further assess.  Assessment & Plan Cellulitis of right foot - Admit to med surg, observation,  FMTS w/ attending Dr. Delores - Antibiotics: IV unasyn  3g q6h - Pain:  - Tylenol  1000 q6h sch  - oxycodone  5 mg q6h prn for breakthrough pain  - f/u ABIs. Consider VVS consult if abnormal  - MRI right foot - DVT US  R LE  - PT/OT - f/u 12/16 Bcx - NGTD T2DM (type 2 diabetes mellitus) (HCC) A1c 7.3, relatively well controlled.  Home reg: Lantus  65 BID, Novolog  15 qlunch and 20 qsupper, mounjaro  - will start with 20U lantus  BID with moderate SSI, adjust as needed  - CBG TID & at bedtime  Diabetic kidney disease (HCC) Baseline creatinine difficult to assess, has slowly been rising last 3 months from 1.87 > 2.40.  Likely progression of diabetic kidney disease. Cannot rule out AKI.  - AM BMP - 500 cc bolus, will be cautious with fluids given HFrEF  - avoid nephrotoxic agents   Chronic and Stable Conditions: Anemia: appears stable, likely 2/2 to renal disease, continue iron HTN: continue home bidil , metoprolol , losartan  Hypothyroidism: continue home levothyroxine  Sciatica: continue home oxycodode prn, decrease home gabapentin  to 100 TID d/t Cr Bipolar disorder: continue home fluoextine and vraylar  Anxiety: continue home seroquel   Tardive dyskinesia: continue home benztropine  CHF: continue home lasix  40 mg daily  Asthma: continue home inhalers   FEN/GI: heart healthy  VTE Prophylaxis: lovenox    Disposition: med surg   History of Present Illness:  [name] is a 53 y.o. female presenting with pain and swelling in right foot.  Patient presented to PCP on 12/15 for right foot pain and swelling that started 12/11.  Reports that she had severe pain with ambulating and decree sensation of in both of her feet, perhaps secondary to peripheral neuropathy.  Patient was recommended to present to the ED, however wanted to trial oral antibiotics.  Patient received Keflex  3 times daily for 7 days, has been taking her Keflex  for the last 2 days.  She returned to the ED today for worsening foot pain and swelling.  Patient also reports that she has had chills at home that were not previously there and is seeing snakes that are not there. She has taken a total  of 4 doses of her antibiotics.    In the ED, patient with VSS.  WBC within normal limits, hemoglobin 10.3, which appears to be around her baseline.  Creatinine elevated at 2.4, however this also appears to be her baseline.  Potassium mildly elevated to 5.3.  X-ray of right foot showed soft tissue swelling around the dorsum of the foot with no radiologic findings of osteomyelitis.  Unasyn  and linezolid  were started.  Review Of Systems: Per HPI  Pertinent  Past Medical History: Hypertension Diabetes Bipolar disorder HFrEF Hypothyroidism Tardive dyskinesia Asthma  Remainder reviewed in history tab.   Pertinent Past Surgical History: Cholecystectomy Ankle arthroscopy  Tubal ligation   Remainder reviewed in history tab.   Pertinent Social History: Tobacco use: No Alcohol use: No Other Substance use: No Lives with husband   Pertinent Family History: Per history tab   .   Important Outpatient Medications: Mounjaro  15 mg weekly NovoLog  15-20 Lantus  65 units twice daily Benztropine ? Vraylar ? Ferrous sulfate  325 Fluoxetine  40 mg Flonase  Fluticasone  salmeterol Lasix  40 mg Gabapentin  600 mg 3 times daily Atarax  50 mg 3 times daily as needed BiDil  3 times daily Synthroid  300 mcg daily Losartan  25 mg daily Robaxin  1000 mg 3 times daily Toprol -XL 50 mg daily Remeron  15 mg daily Pending test result 40 mg daily Seroquel  150 mg at bedtime Rosuvastatin  20 mg   Objective: BP 130/82 (BP Location: Right Wrist)   Pulse 79   Temp 97.8 F (36.6 C)   Resp 19   SpO2 97%  Exam: General: well appearing female, NAD  HEENT: atraumatic, normocephalic, EOM intact  Cardiovascular: RRR, no m/r/g Respiratory: CTAB, NWOB on RA  Gastrointestinal: soft, mildly diffusely tender, no rebound or guarding  MSK: Right foot with overlying erythema and induration, decreased ROM limited by pain, decreased sensation over all toes and plantar surface of R foot. Left foot with decreased sensation throughout. Difficult to palpate tibial pulses, lower extremity cold but with good cap refill.  Derm: No obvious ulcers or erosions on right foot, erythema and swelling over dorsal surface of right foot, worse at R ankle  Neuro: No focal deficits  Psych: A&Ox3   Labs:  CBC BMET  Recent Labs  Lab 02/23/24 1842  WBC 7.9  HGB 10.3*  HCT 34.1*  PLT 394   Recent Labs  Lab 02/23/24 1842  NA 142  K 5.3*  CL 107  CO2 24  BUN 44*  CREATININE 2.40*   GLUCOSE 143*  CALCIUM  9.4     Lactic Acid, Venous    Component Value Date/Time   LATICACIDVEN 2.2 (HH) 02/24/2024 1005      Imaging Studies Performed:  R foot xray 12/16: Soft tissue swelling about the dorsum of the foot. No radiographic findings of osteomyelitis, at this time.   Lonnie Theadore Blunck, MD 02/24/2024, 8:47 AM PGY-2, [facility]  FPTS Intern pager: [phone], text pages welcome Secure chat group [facility] Teaching Service

## 2024-02-24 NOTE — Plan of Care (Signed)
 FMTS Interim Progress Note  S: Saw patient at bedside, was very pleasant and appreciative of her care.  Reports 7 out of 10 pain mostly bothered by the sciatic pain.  Endorses some throbbing pain of right foot as well.  LBM was last Thursday (6 days ago) and was requesting a more aggressive bowel regimen out of concern for this.  Has been feeling a little more chills, but denies feeling feverish.  O: BP 125/78 (BP Location: Left Arm)   Pulse 92   Temp 98.2 F (36.8 C) (Oral)   Resp 18   Ht 5' 11 (1.803 m)   Wt (!) 152.9 kg   SpO2 99%   BMI 47.01 kg/m   General: Resting comfortably in no acute distress  cardiovascular: Regular rate rhythm, normal S1 and S2, no murmurs rubs or gallops Respiratory: Clear to auscultation bilaterally, no wheezes or crackles Abdomen: Active bowel sounds, soft nondistended, moderately tender left lower quadrant, palpable stool burden of descending colon, no guarding. Extremities: 2+ pitting edema of dorsal surface of right foot, tender to palpation, mildly warm, tender with movement of third digit of foot.  Left foot nonedematous and nontender   A/P: Cellulitis versus Charcot foot: Lactic acid briefly elevated this morning resolved spontaneously.  Minimal change to discomfort with treatment so far. - Continue Unasyn   - pain regiment: Tylenol  1000 mg every 6 hours, gabapentin  decreased to 100 mg 3 times daily due to worsening kidney function, as needed oxycodone  5 mg every 6 hours, last dose of 1500, lidocaine  patch every 24 hours  AKI versus worsening CKD: Baseline creatinine appears to be around 1.90.  Will continue to trend, reports she is making urine.  Constipation 2/2 medication: LBM 6 days ago, reports that she usually uses home regimen of MiraLAX  which has not helped so far.  Has been using opiates and on GLP-1. - Increase MiraLAX  to twice daily - Added senna twice daily. - One-time Dulcolax suppository  Lorrane Pac, MD 02/24/2024, 8:26  PM  PGY-1, [facility] Family Medicine Service pager [phone]

## 2024-02-24 NOTE — Assessment & Plan Note (Addendum)
 Baseline creatinine difficult to assess, has slowly been rising last 3 months from 1.87 > 2.40. Likely progression of diabetic kidney disease. Cannot rule out AKI.  - AM BMP - 500 cc bolus, will be cautious with fluids given HFrEF  - avoid nephrotoxic agents

## 2024-02-24 NOTE — ED Provider Notes (Signed)
 [place] EMERGENCY DEPARTMENT AT [facility] Provider Note   CSN: [id number] Arrival date & time: 02/23/24  1714     Patient presents with: Foot Injury (RIGHT)   [name] is a 53 y.o. female.    Foot Injury Patient presents with right foot pain.  Has had a few days of symptoms now.  Had been seen on Monday with today being Wednesday and started on Keflex .  Worsening pain and swelling.  Has had chills at home.  Has seen snakes in her room that were not there.  Did have a scheduled appointment with her PCP later today.  Patient is diabetic and does have neuropathy in the foot.    Past Medical History:  Diagnosis Date   Acute renal failure 05/27/2010   hemodialysis for 6 weeks   Anxiety    Asthma    Depression    Diabetes mellitus    Hypertension    Rhabdomyolysis 06/06/2010   after drug overdose   Suicide attempt by drug ingestion (HCC) 06/05/2010   result rhabdomyolosis and ARF requrining dialysis    SVT (supraventricular tachycardia) 05/28/2021    Prior to Admission medications  Medication Sig Start Date End Date Taking? Authorizing Provider  albuterol  (VENTOLIN  HFA) 108 (90 Base) MCG/ACT inhaler INHALE 2 PUFFS BY MOUTH EVERY 6 HOURS AS NEEDED FOR SHORTNESS OF BREATH 01/26/24   Theophilus Pagan, MD  benztropine  (COGENTIN ) 0.5 MG tablet Take 1 tablet (0.5 mg total) by mouth 2 (two) times daily. 12/17/23   Harl Zane BRAVO, NP  Black Cohosh 450 MG CAPS Take 450 mg by mouth at bedtime.    [provider]  cariprazine  (VRAYLAR ) 1.5 MG capsule Take 1 capsule (1.5 mg total) by mouth daily. 02/10/24   Harl Zane BRAVO, NP  cephALEXin  (KEFLEX ) 500 MG capsule Take 1 capsule (500 mg total) by mouth 3 (three) times daily for 7 days. 02/22/24 02/29/24  Theophilus Pagan, MD  ferrous sulfate  325 (65 FE) MG tablet Take 1 tablet (325 mg total) by mouth daily with breakfast. 12/04/23   Theophilus Pagan, MD  FLUoxetine  (PROZAC ) 40 MG capsule Take 1 capsule (40  mg total) by mouth daily. 02/10/24   Harl Zane BRAVO, NP  fluticasone  (FLONASE ) 50 MCG/ACT nasal spray Place 1 spray into both nostrils daily. 1 spray in each nostril every day 06/10/21   Espinoza, Alejandra, DO  fluticasone -salmeterol (WIXELA INHUB) 250-50 MCG/ACT AEPB Inhale 1 puff into the lungs in the morning and at bedtime. 09/23/22   Theophilus Pagan, MD  furosemide  (LASIX ) 40 MG tablet Take 1 tablet (40 mg total) by mouth daily. 12/04/23   Theophilus Pagan, MD  gabapentin  (NEURONTIN ) 300 MG capsule Take 2 capsules (600 mg total) by mouth 3 (three) times daily. 11/04/23   Everhart, Kirstie, DO  hydrOXYzine  (ATARAX ) 50 MG tablet Take 1 tablet (50 mg total) by mouth 3 (three) times daily as needed for anxiety. 02/10/24   Harl Zane E, NP  ipratropium-albuterol  (DUONEB) 0.5-2.5 (3) MG/3ML SOLN Take 3 mLs by nebulization 3 (three) times daily as needed. 01/07/24   Theophilus Pagan, MD  isosorbide -hydrALAZINE  (BIDIL ) 20-37.5 MG tablet Take 1 tablet by mouth 3 (three) times daily. 12/04/23   Theophilus Pagan, MD  LANTUS  SOLOSTAR 100 UNIT/ML Solostar Pen INJECT 65 UNITS SUBCUTANEOUSLY TWICE DAILY Patient taking differently: Inject 65 Units into the skin 2 (two) times daily at 8 am and 10 pm. 07/21/23   Theophilus Pagan, MD  levothyroxine  (SYNTHROID ) 300 MCG tablet Take 1 tablet (300  mcg total) by mouth daily at 6 (six) AM. 11/04/23   Everhart, Kirstie, DO  lidocaine  4 % Place 1 patch onto the skin daily.    [provider]  Lidocaine -Menthol (CBD4 FREEZE PUMP VANISH SCENT) 4-1 % CREA Apply 1 Application topically daily as needed (pain).    [provider]  losartan  (COZAAR ) 25 MG tablet Take 1 tablet (25 mg total) by mouth at bedtime. 11/04/23   Everhart, Kirstie, DO  methocarbamol  (ROBAXIN ) 500 MG tablet TAKE 2 TABLETS BY MOUTH THREE TIMES DAILY 02/08/24   Theophilus Pagan, MD  metoprolol  succinate (TOPROL -XL) 50 MG 24 hr tablet Take 1 tablet (50 mg total) by mouth at bedtime. Take  with or immediately following a meal. 12/04/23   Theophilus Pagan, MD  mirtazapine  (REMERON ) 15 MG tablet Take 1 tablet (15 mg total) by mouth at bedtime. 02/10/24   Harl Zane BRAVO, NP  NOVOLOG  FLEXPEN 100 UNIT/ML FlexPen INJECT 15 UNITS SUBCUTANEOUSLY WITH LUNCH , AND THEN INJECT 20 UNITS WITH SUPPER 11/27/23   Theophilus Pagan, MD  oxyCODONE -acetaminophen  (PERCOCET/ROXICET) 5-325 MG tablet Take 1 tablet by mouth every 6 (six) hours as needed for severe pain (pain score 7-10).    [provider]  pantoprazole  (PROTONIX ) 40 MG tablet Take 1 tablet (40 mg total) by mouth daily. 12/04/23   Theophilus Pagan, MD  QUEtiapine  Fumarate 150 MG TABS Take 150 mg by mouth at bedtime. 02/10/24   Harl Zane BRAVO, NP  rosuvastatin  (CRESTOR ) 20 MG tablet Take 1 tablet (20 mg total) by mouth daily. 12/04/23   Theophilus Pagan, MD  tirzepatide  (MOUNJARO ) 15 MG/0.5ML Pen Inject 15 mg into the skin once a week. 12/04/23   Theophilus Pagan, MD    Allergies: Ace inhibitors, Haldol  [haloperidol  lactate], Nsaids, Entresto  [sacubitril-valsartan], and Latex    Review of Systems  Updated Vital Signs BP 130/82 (BP Location: Right Wrist)   Pulse 79   Temp 97.8 F (36.6 C)   Resp 19   SpO2 97%   Physical Exam Vitals and nursing note reviewed.  HENT:     Head: Normocephalic and atraumatic.  Skin:    Capillary Refill: Capillary refill takes less than 2 seconds.     Comments: Some tenderness over forefoot.  Does have erythema and swelling of the toes and foot.  No fluctuance.  Does have break in skin on the plantar aspect of the third toe.  Neurological:     Mental Status: She is alert and oriented to person, place, and time.     (all labs ordered are listed, but only abnormal results are displayed) Labs Reviewed  CBC WITH DIFFERENTIAL/PLATELET - Abnormal; Notable for the following components:      Result Value   Hemoglobin 10.3 (*)    HCT 34.1 (*)    All other components within normal limits   BASIC METABOLIC PANEL WITH GFR - Abnormal; Notable for the following components:   Potassium 5.3 (*)    Glucose, Bld 143 (*)    BUN 44 (*)    Creatinine, Ser 2.40 (*)    GFR, Estimated 23 (*)    All other components within normal limits  CULTURE, BLOOD (ROUTINE X 2)  CULTURE, BLOOD (ROUTINE X 2)  I-STAT CG4 LACTIC ACID, ED  I-STAT CG4 LACTIC ACID, ED    EKG: None  Radiology: DG Foot Complete Right Result Date: 02/23/2024 CLINICAL DATA:  Concern for possible osteomyelitisfoot EXAM: RIGHT FOOT COMPLETE - 3+ VIEW COMPARISON:  None Available. FINDINGS: No acute fracture or  dislocation. Mild midfoot degenerative changes. Bony protuberance along the anterior aspect of the talus, possibly osteophytic change. Small undersurface calcaneal heel spur, unchanged. Dorsal soft tissue swelling. IMPRESSION: Soft tissue swelling about the dorsum of the foot. No radiographic findings of osteomyelitis, at this time. Electronically Signed   By: Rogelia Myers M.D.   On: 02/23/2024 19:11     Procedures   Medications Ordered in the ED  oxyCODONE -acetaminophen  (PERCOCET/ROXICET) 5-325 MG per tablet 1 tablet (1 tablet Oral Given 02/23/24 1835)                                    Medical Decision Making  Patient with potential cellulitis of right foot.  Does have some systemic symptoms with hallucinations and chills.  Has been on 2 days of Keflex .  At this point I think patient benefit from mission to the hospital.  Normal lactic acid.  X-ray does not show clear osteomyelitis.  Difficulty palpating a pulse on the dorsum of the right foot, however does have good capillary refill.  Will discuss with family practice for admission.     Final diagnoses:  Cellulitis of foot    ED Discharge Orders     None          Patsey Lot, MD 02/24/24 [phone]

## 2024-02-24 NOTE — Assessment & Plan Note (Addendum)
 A1c 7.3, relatively well controlled.  Home reg: Lantus  65 BID, Novolog  15 qlunch and 20 qsupper, mounjaro  - will start with 20U lantus  BID with moderate SSI, adjust as needed  - CBG TID & at bedtime

## 2024-02-24 NOTE — Assessment & Plan Note (Addendum)
-   Admit to med surg, observation,  FMTS w/ attending Dr. Delores - Antibiotics: IV unasyn  3g q6h - Pain:  - Tylenol  1000 q6h sch  - oxycodone  5 mg q6h prn for breakthrough pain  - f/u ABIs. Consider VVS consult if abnormal  - MRI right foot - DVT US  R LE  - PT/OT - f/u 12/16 Bcx - NGTD

## 2024-02-24 NOTE — Progress Notes (Signed)
° °  Brief Progress Note   _____________________________________________________________________________________________________________  Patient Name: [name] Patient DOB: [birth year] Date: @TODAY @    Waiting on MRI and US  results  _____________________________________________________________________________________________________________  [facility] RN Expeditor Ronal DELENA Bald Please contact us  directly via secure chat (search for [facility]) or by calling us  at [phone] [facility]).

## 2024-02-24 NOTE — Hospital Course (Signed)
 [name] is a 53 y.o.female with a history of T2DM, HTN, HFrEF, tardive dyskinesia, bipolar disorder, asthma who was admitted to the [facility] Medicine Teaching Service at [facility] for right foot swelling and pain. Her hospital course is detailed below:  Right Foot Osteomyelitis Patient treated with keflex  outpatient for apparent cellulitis, failed treatment and admitted for IV antibiotic therapy. Patient was started on IV unasyn  and medications for pain control. ABIs showed abnormal flow to the R big toe but otherwise WNL. DVT ultrasound negative. MRI concerning for bone contusions vs early osteomyelitis. Podiatry was consulted who did not have any recommendations for surgical intervention and agreed with antibiotic course for osteomyelitis. Patient stable for discharge and was sent with 6 weeks of cefadroxil  (last dose 04/07/24) .   Other chronic conditions were medically managed with home medications and formulary alternatives as necessary (T2DM)  PCP Follow-up Recommendations: Ensure patient follows up with podiatry outpatient  Ensure outpatient PT follow up Ensure completion of full course of antibiotics (12/18 - 1/29)

## 2024-02-25 ENCOUNTER — Observation Stay (HOSPITAL_COMMUNITY)

## 2024-02-25 DIAGNOSIS — Z794 Long term (current) use of insulin: Secondary | ICD-10-CM | POA: Diagnosis not present

## 2024-02-25 DIAGNOSIS — Z888 Allergy status to other drugs, medicaments and biological substances status: Secondary | ICD-10-CM | POA: Diagnosis not present

## 2024-02-25 DIAGNOSIS — L03119 Cellulitis of unspecified part of limb: Secondary | ICD-10-CM | POA: Diagnosis not present

## 2024-02-25 DIAGNOSIS — E1121 Type 2 diabetes mellitus with diabetic nephropathy: Secondary | ICD-10-CM | POA: Diagnosis not present

## 2024-02-25 DIAGNOSIS — E039 Hypothyroidism, unspecified: Secondary | ICD-10-CM | POA: Diagnosis present

## 2024-02-25 DIAGNOSIS — G2401 Drug induced subacute dyskinesia: Secondary | ICD-10-CM | POA: Diagnosis present

## 2024-02-25 DIAGNOSIS — M869 Osteomyelitis, unspecified: Secondary | ICD-10-CM | POA: Diagnosis present

## 2024-02-25 DIAGNOSIS — Z886 Allergy status to analgesic agent status: Secondary | ICD-10-CM | POA: Diagnosis not present

## 2024-02-25 DIAGNOSIS — I7090 Unspecified atherosclerosis: Secondary | ICD-10-CM

## 2024-02-25 DIAGNOSIS — E1122 Type 2 diabetes mellitus with diabetic chronic kidney disease: Secondary | ICD-10-CM | POA: Diagnosis present

## 2024-02-25 DIAGNOSIS — D631 Anemia in chronic kidney disease: Secondary | ICD-10-CM | POA: Diagnosis present

## 2024-02-25 DIAGNOSIS — E875 Hyperkalemia: Secondary | ICD-10-CM | POA: Diagnosis present

## 2024-02-25 DIAGNOSIS — Z79890 Hormone replacement therapy (postmenopausal): Secondary | ICD-10-CM | POA: Diagnosis not present

## 2024-02-25 DIAGNOSIS — I5022 Chronic systolic (congestive) heart failure: Secondary | ICD-10-CM | POA: Diagnosis present

## 2024-02-25 DIAGNOSIS — R609 Edema, unspecified: Secondary | ICD-10-CM | POA: Diagnosis not present

## 2024-02-25 DIAGNOSIS — Z79899 Other long term (current) drug therapy: Secondary | ICD-10-CM | POA: Diagnosis not present

## 2024-02-25 DIAGNOSIS — F419 Anxiety disorder, unspecified: Secondary | ICD-10-CM | POA: Diagnosis present

## 2024-02-25 DIAGNOSIS — I130 Hypertensive heart and chronic kidney disease with heart failure and stage 1 through stage 4 chronic kidney disease, or unspecified chronic kidney disease: Secondary | ICD-10-CM | POA: Diagnosis present

## 2024-02-25 DIAGNOSIS — E1141 Type 2 diabetes mellitus with diabetic mononeuropathy: Secondary | ICD-10-CM | POA: Diagnosis present

## 2024-02-25 DIAGNOSIS — L03115 Cellulitis of right lower limb: Secondary | ICD-10-CM | POA: Diagnosis present

## 2024-02-25 DIAGNOSIS — F319 Bipolar disorder, unspecified: Secondary | ICD-10-CM | POA: Diagnosis present

## 2024-02-25 DIAGNOSIS — M79671 Pain in right foot: Secondary | ICD-10-CM | POA: Diagnosis present

## 2024-02-25 DIAGNOSIS — N184 Chronic kidney disease, stage 4 (severe): Secondary | ICD-10-CM | POA: Diagnosis present

## 2024-02-25 DIAGNOSIS — R443 Hallucinations, unspecified: Secondary | ICD-10-CM | POA: Diagnosis present

## 2024-02-25 DIAGNOSIS — J45909 Unspecified asthma, uncomplicated: Secondary | ICD-10-CM | POA: Diagnosis present

## 2024-02-25 DIAGNOSIS — Z7951 Long term (current) use of inhaled steroids: Secondary | ICD-10-CM | POA: Diagnosis not present

## 2024-02-25 DIAGNOSIS — M5432 Sciatica, left side: Secondary | ICD-10-CM | POA: Diagnosis present

## 2024-02-25 DIAGNOSIS — Z7985 Long-term (current) use of injectable non-insulin antidiabetic drugs: Secondary | ICD-10-CM | POA: Diagnosis not present

## 2024-02-25 DIAGNOSIS — E1169 Type 2 diabetes mellitus with other specified complication: Secondary | ICD-10-CM | POA: Diagnosis present

## 2024-02-25 LAB — CBC
HCT: 30.1 % — ABNORMAL LOW (ref 36.0–46.0)
Hemoglobin: 9.3 g/dL — ABNORMAL LOW (ref 12.0–15.0)
MCH: 26.5 pg (ref 26.0–34.0)
MCHC: 30.9 g/dL (ref 30.0–36.0)
MCV: 85.8 fL (ref 80.0–100.0)
Platelets: 366 K/uL (ref 150–400)
RBC: 3.51 MIL/uL — ABNORMAL LOW (ref 3.87–5.11)
RDW: 12.5 % (ref 11.5–15.5)
WBC: 6.4 K/uL (ref 4.0–10.5)
nRBC: 0 % (ref 0.0–0.2)

## 2024-02-25 LAB — BASIC METABOLIC PANEL WITH GFR
Anion gap: 8 (ref 5–15)
BUN: 44 mg/dL — ABNORMAL HIGH (ref 6–20)
CO2: 25 mmol/L (ref 22–32)
Calcium: 9.2 mg/dL (ref 8.9–10.3)
Chloride: 110 mmol/L (ref 98–111)
Creatinine, Ser: 2.26 mg/dL — ABNORMAL HIGH (ref 0.44–1.00)
GFR, Estimated: 25 mL/min — ABNORMAL LOW (ref >60)
Glucose, Bld: 134 mg/dL — ABNORMAL HIGH (ref 70–99)
Potassium: 4.7 mmol/L (ref 3.5–5.1)
Sodium: 142 mmol/L (ref 135–145)

## 2024-02-25 LAB — MAGNESIUM: Magnesium: 1.8 mg/dL (ref 1.7–2.4)

## 2024-02-25 LAB — GLUCOSE, CAPILLARY
Glucose-Capillary: 165 mg/dL — ABNORMAL HIGH (ref 70–99)
Glucose-Capillary: 150 mg/dL — ABNORMAL HIGH (ref 70–99)
Glucose-Capillary: 183 mg/dL — ABNORMAL HIGH (ref 70–99)
Glucose-Capillary: 178 mg/dL — ABNORMAL HIGH (ref 70–99)

## 2024-02-25 LAB — VAS US ABI WITH/WO TBI
Left ABI: 1.09
Right ABI: 1.07

## 2024-02-25 MED ORDER — POLYETHYLENE GLYCOL 3350 17 G PO PACK
17.0000 g | PACK | Freq: Two times a day (BID) | ORAL | Status: DC
  Administered 2024-02-25: 13:00:00 17 g via ORAL
  Filled 2024-02-25 (×3): qty 1

## 2024-02-25 MED ORDER — CEFADROXIL 500 MG PO CAPS
500.0000 mg | ORAL_CAPSULE | Freq: Two times a day (BID) | ORAL | Status: DC
  Administered 2024-02-25 – 2024-02-26 (×3): 500 mg via ORAL
  Filled 2024-02-25 (×3): qty 1

## 2024-02-25 MED ORDER — MAGNESIUM SULFATE 2 GM/50ML IV SOLN
2.0000 g | Freq: Once | INTRAVENOUS | Status: AC
  Administered 2024-02-25: 10:00:00 2 g via INTRAVENOUS
  Filled 2024-02-25: qty 50

## 2024-02-25 NOTE — Progress Notes (Signed)
 Daily Progress Note Intern Pager: [phone]  Patient name: [name] Medical record number: [id number] Date of birth: 24-Jan-1971 Age: 53 y.o. Gender: female  Primary Care Provider: Theophilus Pagan, MD Consultants: Podiatry Code Status: Full  Pt Overview and Major Events to Date:  12/17: Admitted  Medical Decision Making:  53 year old female with past medical history of type 2 diabetes, HTN, HFrEF admitted for right third toe pain and swelling concerning for cellulitis vs osteomyelitis.  MRI showed soft tissues swelling along the dorsum of the foot with edema in the fused middle and distal phalanx of the third toe.  Podiatry consulted and recommended no need for amputation. Initially started on Unasyn  and linezolid , de escalated to cefadroxil  after discussion with pharmacy and podiatry. Plan to continue for 6 weeks for treatment of osteomyelitis. Dispo pending PT/OT recs.   Assessment & Plan Cellulitis of right foot Patient appears well, foot warm and well perfused with soft tissue swelling and TTP over the dorsum but otherwise exam reassuring. VSS.  -Bcx no growth 24h -De escalate antibiotics to cefadroxil  500mg  BID for 6 weeks - Pain:  - Tylenol  1000 q6h sch  - oxycodone  5 mg q6h prn for breakthrough pain  - f/u ABIs. Consider VVS consult if abnormal  - DVT US  R LE WNL - PT/OT recs pending T2DM (type 2 diabetes mellitus) (HCC) CBGs in range, only received 3u SSI -Continue with 20U lantus  BID with moderate SSI - CBG TID & at bedtime  Diabetic kidney disease (HCC) (Resolved: 02/25/2024) Cr downtrending today.  Cautious with fluids given HFrEF  - avoid nephrotoxic agents    FEN/GI: Carb modified PPx: Lovenox  Dispo:Home tomorrowpending PT recsand monitoring on PO antibiotics.   Subjective:  No acute events overnight.  Patient feeling well outside of significant discomfort in the dorsum of her right foot.  Discussed her case in some detail including podiatry  recommendations, her risk factors and blood sugar management.  Patient expressed that she has much faith in her primary care provider Dr. Theophilus.  Objective: Temp:  [98.2 F (36.8 C)-99.6 F (37.6 C)] 98.9 F (37.2 C) (12/18 0741) Pulse Rate:  [88-92] 91 (12/18 0741) Resp:  [16-18] 17 (12/18 0741) BP: (114-143)/(62-102) 114/71 (12/18 0741) SpO2:  [97 %-100 %] 97 % (12/18 0818) Weight:  [152.9 kg] 152.9 kg (12/17 1724) Physical Exam: General: Awake, alert, NAD. Communicates clearly. Cardio: RRR. Normal S1, S2. No murmur, rub, gallop. 2+ radial and dorsalis pedis pulses b/l w/ good capillary refill. No LE edema.  Resp: CTA bilaterally. No wheezes, rales, or rhonchi. Normal work of breathing on room air Abdomen: soft, non-tender, non-distended.  Extremities: Swelling and TTP over the dorsum of the R midfoot and base of the toes. Sole of the foot without TTP. Pt has intact ROM that is somewhat limited by pain.  Skin: No lesions or notable skin breakdown appreciated on R foot or between toes. Skin appears intact and well hydrated.   Laboratory: Most recent CBC Lab Results  Component Value Date   WBC 6.4 02/25/2024   HGB 9.3 (L) 02/25/2024   HCT 30.1 (L) 02/25/2024   MCV 85.8 02/25/2024   PLT 366 02/25/2024   Most recent BMP    Latest Ref Rng & Units 02/25/2024    5:20 AM  BMP  Glucose 70 - 99 mg/dL 865   BUN 6 - 20 mg/dL 44   Creatinine 9.55 - 1.00 mg/dL 7.73   Sodium 864 - 854 mmol/L 142   Potassium  3.5 - 5.1 mmol/L 4.7   Chloride 98 - 111 mmol/L 110   CO2 22 - 32 mmol/L 25   Calcium  8.9 - 10.3 mg/dL 9.2     Magnesium  1.8 Imaging/Diagnostic Tests: MRI R foot w/o   IMPRESSION: 1. Edema in the fused middle and distal phalanx of the third toe, suspicious for acute osteomyelitis. 2. Subcutaneous edema along the dorsum of the foot extending to the bases of the toes, potentially from cellulitis. 3. Minimal degenerative spurring at the first metatarsophalangeal  joint. 4. Regional muscular atrophy.   Manon Jester, DO 02/25/2024, 8:51 AM  PGY-1, [facility] FPTS Intern pager: [phone], text pages welcome Secure chat group [facility] Teaching Service

## 2024-02-25 NOTE — Progress Notes (Signed)
 PT Cancellation Note  Patient Details Name: [name] MRN: [id number] DOB: September 17, 1970   Cancelled Treatment:    Reason Eval/Treat Not Completed: Fatigue/lethargy limiting ability to participate. Pt reports she is too tired and exhausted from lack of sleep the last 2 days and requests therapy come back later. She agreed to 14:15 this PM.   [name] 02/25/2024, 11:34 AM  Leita Sable, PT, DPT Acute Rehabilitation Services Secure Chat Preferred Office: [phone]

## 2024-02-25 NOTE — TOC Initial Note (Signed)
 Transition of Care [facility]) - Initial/Assessment Note    Patient Details  Name: [name] MRN: [id number] Date of Birth: 24-Oct-1970  Transition of Care [facility]) CM/SW Contact:    Lauraine FORBES Saa, LCSWA Phone Number: 02/25/2024, 2:18 PM  Clinical Narrative:                  2:18 PM CSW introduced self and role to patient. Patient confirmed she resides at home with son who sometimes provides transport to appointments. Patient confirmed SDOH (housing, transport, food) needs. CSW provided patient SDOH resources. Patient informed CSW that she receives food stamps and is appealing disability denial. Patient expressed interest in Medicaid. CSW relayed interest to financial counseling. Patient confirmed she does not have SNF or HH history but confirmed DME (cane, rollator, wheelchair) history. Per chart review, patient has a PCP and insurance. No TOC needs identified at this time. TOC will continue to follow.  Expected Discharge Plan: Home/Self Care Barriers to Discharge: Continued Medical Work up   Patient Goals and CMS Choice Patient states their goals for this hospitalization and ongoing recovery are:: to return home          Expected Discharge Plan and Services       Living arrangements for the past 2 months: Single Family Home                                      Prior Living Arrangements/Services Living arrangements for the past 2 months: Single Family Home Lives with:: Adult Children Patient language and need for interpreter reviewed:: Yes Do you feel safe going back to the place where you live?: Yes      Need for Family Participation in Patient Care: No (Comment)   Current home services: DME Criminal Activity/Legal Involvement Pertinent to Current Situation/Hospitalization: No - Comment as needed  Activities of Daily Living   ADL Screening (condition at time of admission) Independently performs ADLs?: Yes (appropriate for developmental age) Is the patient  deaf or have difficulty hearing?: No Does the patient have difficulty seeing, even when wearing glasses/contacts?: No Does the patient have difficulty concentrating, remembering, or making decisions?: No  Permission Sought/Granted Permission sought to share information with : Family Supports Permission granted to share information with : No (Contact information on chart)  Share Information with NAME: Harden Sharps     Permission granted to share info w Relationship: Sister  Permission granted to share info w Contact Information: [phone]  Emotional Assessment Appearance:: Appears stated age Attitude/Demeanor/Rapport: Engaged Affect (typically observed): Accepting, Adaptable, Pleasant, Appropriate, Calm, Stable Orientation: : Oriented to Self, Oriented to  Time, Oriented to Situation, Oriented to Place Alcohol / Substance Use: Illicit Drugs Psych Involvement: No (comment)  Admission diagnosis:  Cellulitis [L03.90] Cellulitis of foot [L03.119] Patient Active Problem List   Diagnosis Date Noted   Cellulitis of right foot 02/24/2024   Cellulitis 02/24/2024   Cellulitis of foot 02/24/2024   Neuropathic pain 11/03/2023   Sciatica of left side 11/03/2023   Chronic health problem 11/02/2023   Hypomagnesemia 11/02/2023   SOB (shortness of breath) 11/02/2023   Weakness of both lower limbs 11/02/2023   Acute on chronic congestive heart failure (HCC) 11/01/2023   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   Chronic low back pain with left-sided sciatica 07/08/2022   Chronic systolic CHF (congestive heart failure) (HCC) - LVEF 35-40% by echo 03-2023    Bilateral knee pain 03/14/2019  HLD (hyperlipidemia) 03/19/2017   Asthma 03/14/2017   Major depressive disorder, recurrent severe without psychotic features (HCC) 05/11/2015   Vitamin D  deficiency 04/11/2015   CKD stage 3b, GFR 30-44 ml/min (HCC) - baseline Scr 1.7-2.1 11/06/2010   Essential hypertension, benign 02/03/2007   Hypothyroidism  05/07/2006   T2DM (type 2 diabetes mellitus) (HCC) 05/07/2006   Obesity, Class III, BMI 40-49.9 (morbid obesity) (HCC) 05/07/2006   Iron deficiency anemia 05/07/2006   Obsessive-compulsive disorder 05/07/2006   Generalized anxiety disorder 05/07/2006   PCP:  Theophilus Pagan, MD Pharmacy:   [facility] [address] [place] - [address] [place] [address] Phone: [phone] Fax: [phone]  [id number] - [place], [place] - [address] AT [facility] OF [address] [place] [address] Phone: [phone] Fax: [phone]  [id number] - [address] ([place]), [place] - [address] [place]) [place] [address] Phone: [phone] Fax: [phone]  [facility] [address] [place] [address] Phone: [phone] Fax: [phone]  [facility] [address] [place] [address] Phone: [phone] Fax: [phone]     Social Drivers of Health (SDOH) Social History: SDOH Screenings   Food Insecurity: No Food Insecurity (02/24/2024)  Housing: High Risk (02/24/2024)  Transportation Needs: Unmet Transportation Needs (02/24/2024)  Utilities: Not At Risk (02/24/2024)  Alcohol Screen: Low Risk (02/24/2022)  Depression (PHQ2-9): Medium Risk (02/10/2024)  Financial Resource Strain: High Risk (06/19/2023)  Tobacco Use: Low Risk (02/23/2024)   SDOH Interventions: Housing Interventions: [employer] Provided, Inpatient [employer] Transportation Interventions: [employer] Provided, Inpatient TOC   Readmission Risk Interventions     No data to display

## 2024-02-25 NOTE — Progress Notes (Signed)
 Right lower extremity venous duplex and ABI's have been completed. Preliminary results can be found in CV Proc through chart review.   02/25/2024 10:15 AM Cathlyn Collet RVT

## 2024-02-25 NOTE — Discharge Instructions (Signed)
 [employer] assistance programs Crisis assistance programs  -Part[facility] Ending [facility]elessness Arts development officer. If you are experiencing homelessness in [place], [place] , your first point of contact should be Pensions consultant. You can reach Coordinated Entry by calling ([phone] ([phone] or by emailing [email or web address] [email or web address].  Community access points: [employer] [phone] [address], [place]) every Tuesday from 9am-10am. [facility] ([address], [place]) every Wednesday from 8am-9am.   - Coordinated Re-entry [name]: Dial 211 and request. Offers referrals to homeless shelters in the area.    -The [employer] 8594377995) offers several services to local families, as funding allows. The Emergency Assistance Program (EAP), which they administer, provides household goods, free food, clothing, and financial aid to people in need in the [place]  area. The EAP program does have some qualification, and counselors will interview clients for financial assistance by written referral only. Referrals need to be made by the Department of Social Services or by other EAP approved human services agencies or charities in the area.  -Open Door Ministries of [employer], which can be reached at [phone], offers emergency assistance programs for those in need of help, such as food, rent assistance, a soup kitchen, shelter, and clothing. They are based in [facility] [place]  but provide a number of services to those that qualify for assistance.   [place] Department of Social Services may be able to offer temporary financial assistance and cash grants for paying rent and utilities, Help may be provided for local county residents who may be experiencing personal crisis when other resources, including government programs, are not available. Call [phone]  -High [employer] Army is a [employer] agency, The organization can offer emergency assistance for paying rent, Caremark Rx, utilities, food, household products and furniture. They offer extensive emergency and transitional housing for families, children and single women, and also run a Boy's and D[employer] Food. Thrift Shops, Secondary school teacher, and other aid offered too. [address], [place], [place]  [address], ([phone]  -[place] Low Income Energy Assistance Program -- This is offered for [facility] families. The federal government created [employer] Program provides a one-time cash grant payment to help eligible low-income families pay their electric and heating bills. [address] [place], [place]  [address], [phone]  -[place] Emergency Assistance -- A program offers emergency utility and rent funds for greater [employer] area residents. The program can also provide counseling and referrals to charities and government programs. Also provides food and a free meal program that serves lunch Mondays - Saturdays and dinner seven days per week to individuals in the community. [address], [place], [place]  [address], ([phone]  -[employer] - Offers affordable apartment and housing communities across      [place] and [place]. The low income and seniors can access public housing, rental assistance to qualified applicants, and apply for the section 8 rent subsidy program. Other programs include Chiropractor and Engineer, maintenance. [address] [place], [place]  [address], dial [phone].  -The [facility] provides transitional housing to veterans and the disabled. Clients will also access other services too, including assistance in applying for Disability, life skills classes, case management, and assistance in finding permanent housing. [address], [place], [place]  [place] [address], call [phone]  -[place] Transitional Housing through [facility] is for people who were just  evicted or that are formerly homeless. The non-profit will also help then gain self-sufficiency, find a home or apartment to live in, and also provides information on rent assistance when needed. Phone [phone]  -The [place] [employer] [facility] helps low income, elderly, or disabled residents in seven counties in the [place] ([place], [place], [place], [place], [place], [place], [place], and [place]) save energy and reduce their utility bills by improving energy efficiency. Phone [phone].  -[employer] is located in the [place] [facility] in the [employer], [address], [place], [place] [address]. Parking is in the rear of the building. Phone: [phone]   General Email: Tonette Franco  GHC provides free housing counseling assistance in locating affordable rental housing or housing with support services for families and individuals in crisis and the chronically homeless. We provide potential resources for other housing needs like utilities. Our trained counselors also work with clients on budgeting and financial literacy in effort to empower them to take control of their financial situations. [employer] collaborates with homeless service providers and other stakeholders as part of the [employer] COC (Continuum of Care). The (COC) is a regional/local planning body that coordinates housing and services funding for homeless families and individuals. The role of GHC in the COC is through housing counseling to work with people we serve on diversion strategies for those that are at imminent risk of becoming homeless. We also work with the Coordinated Assessment/Entry Specialist who attempts to find temporary solutions and/or connects the people  to Housing First, Rapid Re-housing or transitional housing programs. Our Homelessness Prevention Housing Counselors meet with clients on business days (Monday-Fridays, except scheduled holidays) from 8:30 am to 4:30 pm.  Legal assistance for evictions, foreclosure, and more -If you need free legal advice on civil issues, such as foreclosures, evictions, Electronics engineer, government programs, domestic issues and more, Landscape architect of [place]  [facility]) is a Associate Professor firm that provides free legal services and counsel to lower income people, seniors, disabled, and others, The goal is to ensure everyone has access to justice and fair representation. Call them at ([phone].  [facility] for Housing and Community Studies can provide info about obtaining legal assistance with evictions. Phone ([phone].  Data processing manager  The [employer], [employer]. offers job and Dispensing optician. Resources are focused on helping students obtain the skills and experiences that are necessary to compete in today's challenging and tight job market. The non-profit faith-based community action agency offers internship trainings as well as classroom instruction. Classes are tailored to meet the needs of people in the [facility] region. [place], [place] [address], [phone]  Foreclosure prevention/Debt Services Family Services of the [employer] Credit Counseling Service inludes debt and foreclosure prevention programs for local families. This includes money management, financial advice, budget review and development of a written action plan with a Pensions consultant to help solve specific individual financial problems. In addition, housing and mortgage counselors can also provide pre- and post-purchase homeownership counseling, default resolution counseling (to prevent foreclosure) and reverse mortgage counseling. A Debt Management Program allows  people and families with a high level of credit card or medical debt to consolidate and repay consumer debt and loans to creditors and rebuild positive credit ratings and scores. Contact ([phone] F1555895.  Community clinics in [place] -Health Department [facility]: [address] [place], [place], [address]. [phone].  -Health Department [place] [facility]: [phone]  [address], [facility], [address]. [phone].  -[facility] offers medical care through a group of doctors, pharmacies and other healthcare related agencies that offer services for low income, uninsured adults in [place]. Also offers adult Dental care and assistance with applying for an [employer]. Call [phone].   [facility]. This center provides low-cost health care to those without health insurance. Services offered include an onsite pharmacy. Phone [phone]. [address], [place].  -Medication Assistance Program serves as a link between pharmaceutical companies and patients to provide low cost or free prescription medications. This service is available for residents who meet certain income restrictions and have no insurance coverage. PLEASE CALL [phone] Jonette Nestle) OR [phone] ([place])  -One Step Further: Materials engineer, The [employer] Support & Nutrition Program, [employer]. Call [phone]/ [phone].  Food pantry and assistance -Urban Ministry-Food Bank: [address], [place] [address]. Phone [phone]  -Blessed Table Food Pantry: [address], [place], [place] [address]. [phone].  -Missionary Ministry: has the purpose of visiting the sick and shut-ins and provide for needs in the surrounding communities. Call [phone]. Email: [email or web address] This program provides: Food box for seniors, Financial assistance, Food to meet basic  nutritional needs.  -Meals on Wheels with Senior Resources: [facility] residents age 43 and over who are homebound and unable to obtain and prepare a nutritious meal for themselves are eligible for this service. There may be a waiting list in certain parts of [facility] if the route in that area is full. If you are in [facility] and [place] call [phone] to register. For all other areas call [phone] to register.  -Greater Dietitian: [email or web address]://[email or web address]  TRANSPORTATION: -[employer] Us Department of Health: Call [facility] and [employer] at [phone] for details. AttractionGuides.es  -Access [place]: Access [place] is the [employer] Agency's shared-ride transportation service for eligible riders who have a disability that prevents them from riding the fixed route bus. Call [phone]. Access [place] riders must pay a fare of $1.50 per trip, or may purchase a 10-ride punch card for $14.00 ($1.40 per ride) or a 40-ride punch card for $48.00 ($1.20 per ride).  -The Shepherd's WHEELS rideshare transportation service is provided for senior citizens (60+) who live independently within [place] city limits and are unable to drive or have limited access to transportation. Call [phone] to schedule an appointment.  -[place] Transportation: For Medicare or Medicaid recipients call [phone]?[facility], wheelchair Carloyn Chi, and ambulatory quotes available.   FLEEING VIOLENCE: -Family Services of the [place]- 24/7 Crisis line [phone]) -[facility] [name] Centers: (336) [phone]-[phone])  [place] 2-1-1 is another useful way to locate resources in the community. Visit ShedSizes.ch to find service information online. If you need additional assistance, 2-1-1 Referral Specialists are available 24 hours a day, every day by dialing  2-1-1 or [phone] from any phone. The call is free, confidential, and available in any language.  Affordable Housing Search http://www.nchousingsearch.[facility])   M-F 8a-3p [address] [place]  [place], [place] [address] [phone] Services include: laundry, barbering, support groups, case management, phone & computer access, showers, AA/NA mtgs, mental health/substance abuse nurse, job skills class, disability information, VA assistance, spiritual classes, etc. Winter Shelter available when temperatures are less than 32 degrees.   HOMELESS SHELTERS [place] [facility] Night Shelter at [facility]- Call [phone] ext. [phone]  or ext. [phone]. Located at [address], [place], [place] [address]  [facility] Mens Shelter- Call [phone]. Located at [address], [place] [address].  [facility]- [employer]. Call [phone]. Office located at [address], [place] [address].  Pathways Family Housing through [place] [phone].  [facility] Family Shelter- Call [phone]. Located at [address] [place], [place], [place] [address].  Room at the Inn-For Pregnant mothers. Call [phone]. Located at [address] [place], [address].  [facility]-For men in [place]. Call [phone]. [facility]-Shelter in [place]. Call [phone].  Home of [employer] for [employer] [phone]. Office located at [address] [place], [address].  [employer] be agreeable to help with chores. Call [phone] ext. [phone].  Men's: [address] [place], [place] [address]. Women's: [facility]  [address] [place], [place] [address]  Crisis Services Therapeutic Alternatives Mobile Crisis Management- [phone]  [facility] [address] [place], [place] [address]. Phone: [phone] Rent/Utility Assistance in  [facility]:  INNOVATIVE PATHWAYS [address] [place], [place] [address] [phone] Mon 8:00am - 6:00pm; Tue 8:00am - 6:00pm; Wed 8:00am - 6:00pm; Thu 8:00am - 6:00pm; Fri 8:00am - 6:00pm; Email: [email or web address] Eligibility: Residents of [place], [place], [place], [place], [place] and [place] that meet income limits. Call or text for eligibility screening.   [facility] MINISTRY [address] [place], [place], [place] [address] [phone] (Main: Rental Assistance) [phone] (Main: Utility Assistance) Mon 8:30am - 5:00pm; Tue 8:30am - 5:00pm; Wed 8:30am - 5:00pm; Thu 8:30am - 5:00pm; Fri 8:30am - 5:00pm; Website: http://www.greensborourbanministry.org/emergency-assistance-program Eligibility: People who have an unexpected crisis or emergency that can be verified. Must have some form of income and meet income limits. At the first of the month, only helps with rent/mortgage assistance for those who have court ordered eviction notices. Call for application information. Call for exact documents that will be needed. Examples of documents that may be needed: Photo ID, Social Security cards for everyone in the household, and proof of income for previous 2 months. Copy of eviction notice for rent assistance and copy of final notice for utility assistance. Statements or receipts of bills for previous 2 months.   [facility] - [place] [address], [place], [place] [address] [phone] (Main) [phone] (Alternate) Mon 9:00am - 5:00pm; Tue 9:00am - 5:00pm; Wed 9:00am - 5:00pm; Thu 9:00am - 5:00pm; Fri 9:00am - 5:00pm; Website: http://[email or web address].salvationarmy.org/[place]/emergency-financial-assistance Email: [email or web address] Eligibility: People experiencing a housing crisis with past-due rent and/or utilities and meet income limits. Must be willing to take part in 6 Call or visit website to download  application. Return complete application by mail or email only. Documents: Help with Utilities: Photo ID, proof of household income, copies of monthly bills or receipts, and a final disconnection/shut-off notice. Help with Rent or Mortgage: Photo ID, proof of income, copies of monthly bills or receipts, and eviction notice. Help with Household Goods: Photo ID, proof of household income, copies of monthly bills or receipts, and a fire or flood report.  [facility] - [place] [address] [place], [place] [address] [phone] (Main) Mon 8:00am - 5:00pm; Tue 8:00am - 5:00pm; Wed 8:00am - 5:00pm; Thu 8:00am - 5:00pm; Fri 8:00am - 12:00pm; Website: http://[email or web address].salvationarmy.org/[place]/emergency-financial-assistance Email: [email or web address] Call for eligibility information. Apply :Utilities Assistance: Visit office by 8:30am on 1st and 4th Monday of each  month to pick up application. Rent and Mortgage Assistance: Visit office by 8:30am on 2nd and 3rd Monday of each month to pick up application. NOTE: If Monday falls on a holiday applications can be picked up the following Tuesday. Documents required will be listed on application.  [facility] [place] [facility] [phone] (Main) Seen by appointment only. Call for more information. Eligibility: Meet income limits. Apply: Call for information on how to schedule an appointment. Each month there is a specific day to call to schedule an appointment. It is stated on the agency voicemail message. Appointments fill up quickly each month. Documents: Photo ID, copy of current utility bill.  [facility] HANDS [place] [address] [place], [place] [address] [phone] (Main) Tue 9:00am - 4:00pm; Wed 9:00am - 4:00pm; Thu 9:00am - 4:00pm; Website: http://www.[email or web address] Email: [email or web address]@gmail [email or web address] Eligibility: Utility Assistance: Meet income limits and be a Haematologist. [employer] customers do not qualify. Must not have received utility assistance for another agency within the last 90 days. Rent Assistance: Residents of [place] who meet income limits. Must not have received rent assistance for another agency within the last 90 days. Apply: Call to schedule an appointment. Documents: Utility Assistance: Photo ID, City of [employer], copy of lease (if not paying a mortgage), proof of income, and monthly expenses. Rent Assistance: Photo ID, W-9 from the landlord, copy of the lease, proof of income, and a list of monthly expenses.  OPEN DOOR MINISTRIES - [place] [address] [place], [place] [address] [phone] (Main: Help With Rent) [phone] (Main: Help With Utilities) Mon 9:00am - 4:00pm; Tue 9:00am - 4:00pm; Wed 9:00am - 4:00pm; Thu 9:00am - 4:00pm; Fri 9:00am - 4:00pm; Website: MotivationalSites.no Email: [email or web address] Eligibility: People experiencing a financial crisis. Apply: Call to schedule an appointment Wednesday, 7:30am. Documents: Photo ID, Social Security card, proof of income, and proof of address. Other documents may be required, depending on service. Call for more information.  LOW INCOME ENERGY ASSISTANCE PROGRAM DEPARTMENT OF SOCIAL SERVICES - [facility] [address], [place], [place] [address] [phone] (Main) Mon 8:00am - 5:00pm; Tue 8:00am - 5:00pm; Wed 8:00am - 5:00pm; Thu 8:00am - 5:00pm; Fri 8:00am - 5:00pm; Website: [email or web address] Eligibility: Meet income limits and resource guidelines. Each household is only eligible once, even if multiple members apply. Apply: Call to see if funds are available. Visit to complete an application, call to have 1 mailed, or apply online at [email or web address] NOTE: Households with a person age 60  and over or a person with a documented disability can apply beginning December 1. Other households can apply beginning January 1. Documents: Photo ID, birth certificate, proof of household income, copy of utility bill, latest bank statement, the names and Social Security numbers for everyone in the household, and proof of disability if under age 17.  LOW INCOME ENERGY ASSISTANCE PROGRAM DEPARTMENT OF SOCIAL SERVICES - [facility] [address] [place], [place], [place] [address] [phone] (Main) Mon 8:00am - 5:00pm; Tue 8:00am - 5:00pm; Wed 8:00am - 5:00pm; Thu 8:00am - 5:00pm; Fri 8:00am - 5:00pm; Website: [email or web address] Eligibility: Meet income limits and resource guidelines. Each household is only eligible once, even if multiple members apply. Apply: Call to see if funds are available. Visit to complete an application, call to have 1 mailed, or apply online at [email or web address] NOTE: Households with a person age 8 and over or a person with a documented  disability can apply beginning December 1. Other households can apply beginning January 1. Documents: Photo ID, birth certificate, proof of household income, copy of utility bill, latest bank statement, the names and Social Security numbers for everyone in the household, and proof of disability if under age 36.  [facility]  [place] [facility]ISTRY Address: [address] [place], [place] [address] Phone Number: [phone] Hours of Operation: Residents of [place] can come to obtain food Monday through Friday from 8:30am until 3:30pm. Photo ID and Social Security cards required for all residents of a household. Can come six times a year  THE BLESSED TABLE Address: [address] [place], [place] [address] Phone Number: [phone] Hours of Operation: Operates Tuesday-Friday 10:00 a.m. to 1 p.m. Requirements: Referral from DSS needed. May come 6 times a  year, 30 days apart. Photo ID and SS required for all residents of household.  [facility] MINISTRIES Address: [address] [place], [place] [address] Phone Number: [phone] Hours of Operation: Food pantry is open on the last Saturday of each month from 10:00 am - 12:00 noon. No appointment needed. No qualifications.  [facility] Address: [address] [place], [place] [address] Phone Number: [phone]. [phone] Hours of Operation: Must make reservations to pick up food on Saturdays. Sign ups for Saturday pick up beginning at 8:30 a.m. on Monday morning.  [facility] Address: [address] [place], [place] [address] Phone Number: [phone] Hours of Operation: If you need food, bring proper identification such as a driver's license to receive a bag of food once a month. Requirements: Can come once every 30 days with referral DSS, Holiday representative, Mental health etc. Each referral good for six visits. Photo ID required. *1st visit no referral required.  [facility] Address: [address] [place], [place] [address] Phone Number: [phone]  [place] [facility] Address: [address] [place], [place] [address] Phone Number: [phone] Hours of Operation:  You can register at [email or web address] for free groceries  FREE INDEED FOOD PANTRY Address: [address] [place], [place] [address] Phone Number: [phone] Hours of Operation: Drive through giveaway, first come first served. Every 3rd Saturday 11AM - 1PM  [facility] [address] Address: [address] [place] [address] Phone Number: [phone]   [place]  HAND TO HAND FOOD PANTRY Address: [address] [place], [place] [address] Phone Number: [phone] Hours of Operation: Once a month every 3rd Saturday  [facility] Address: [address] [place], [place] [address] Phone Number: [phone] Hours of  Operation: Distribution happens from 9:00-10:00 a.m. every Saturday.     HELPING HANDS Address: [address] [facility] [address] [place], [place] [address] Phone Number: [phone] Hours of Operation: ONCE a week for the community food distribution held every Tuesday, Wednesday and Thursday from 11 a.m. - 2:00 p.m. Food is available on a first come, first serve basis and varies week to week. No appointment necessary for drive thru pick up.  [facility] Address: [address] [place], [place] [address] Phone Number: [phone] Hours of Operation: Open every 3rd Thursday 9:30 a.m. - 11:00 a.m.  HOPE [facility] Address: [address] [place],  [address] Phone Number: [phone] Hours of Operation: Please call for hours, directions, and questions  [facility] [place] [facility] Address: [address] [place], [place]  [address] Phone Number: [phone] Website: https://www.Hollyguns.co.za Food Finder app: https://findfood.ghpfa.org  [employer] Address: [address]  [place], [place] [address] Phone Number: [phone] Hours of Operation: Contact Bree Harpe. Enrolled Substance Abuse Clients Only  [facility] Address: [address] [place], [address]  Phone Number: [phone] Hours of Operation: Contact Alene Ana. Food pantry open the 3rd Saturday of each month from 9 a.m. -12 p.m. only  [place] [facility] Address: [address] [place], [place] [address] Phone Number: [phone] Hours of Operation: Contact Loletta Ripple. Emergency food bank open on Saturdays by appointment only  [facility] Address: [address] [place], [place] [address] Phone Number: [phone] Hours of Operation: No specific contact person; Anyone can help  [facility] Address: [address] [place], [place] [address] Phone Number: [phone] Hours of Operation: Contact Julia Oats. Agency  gives out a bag of food every Thursday from 2-4 p.m. only, and also provides a community meal every Thursday between 5-6 p.m. Other services provided include rent/mortgage and utility assistance, women's winter shelter, thrift store, and senior adult activities.  [facility] [place] Address: [address] [place], [place] [address] Phone Number: [phone] Hours of Operation: The Emergency Food Assistance Program provides individuals and families with a generous supply of food including meat, fresh vegetables, and nonperishable items. The food box contains five days' worth of food, and each family or individual can receive a box once per month. M, W, Th, Fr 11am-2pm, walk-ins welcome.  [facility] Address: [address] [place], [place] [address]  Phone Number: [phone] Hours of Operation: Contact Asia Blanca Bunch. Tuesdays and Thursdays from 11am - 3pm by appointment only  Sunday, by APPOINTMENT ONLY  [address] of [place], [address] [place], [address], [phone], 3.2 mi from [facility], call in advance for appointment at 10:00am or at 4:00pm, must provide valid photo ID  Monday  9:30am-5:00pm [facility], [address] [place] [id number], [phone], 0.9 mi from [facility], can come four times per year, bring your photo ID and SS cards for other residents of household, will make appointments for those who work and need to come after 5pm  10:00am-12:00noon [employer], [address], [address], [phone], 1.7 mi from [facility], can come once every 60 days per household, need referral from DSS, [employer], etc., bring photo ID and SS card   10:00am-1:00pm [employer] the [facility],  [address] [place], [address], [phone], 7.7 mi from [facility], can come once every thirty days with a referral from DSS, [employer], Mental  Health, etc. -- each referral good for six visits, bring photo ID   10:00am-1:00pm [address], [address], [phone], 4.2 mi from [facility], can come once every 6 months, open to [facility] residents, bring photo ID and copy of a current utility bill in your name, please call first to verify that food is available  6:30pm-8:30pm [facility], [address] [address], [phone], 3.2 mi from [facility], can come once every 30 days, maximum 6 times per year, bring your photo ID and SS numbers for other residents of household  Monday by APPOINTMENT ONLY  [facility], [address] [place], [address],  [phone], 2.5 mi from [facility], call in advance for appointment between 10:00am-2:00pm, bring your photo ID and  SS cards for all residents of household, can come once every 3 months  [facility], [address], [address], [phone], 0.7 mi from [facility], call in advance for appointment, can come once every 30 days, bring your photo ID and SS cards for other residents of household   Tuesday  9:00am-12:00noon [employer], [address], [address], [phone], 1.3 mi from [facility], can come once every 3 months, bring your photo ID and SS numbers for other residents of household   9:00am-1:00pm [facility] [address],  [place], [address], [phone]/Ext [phone], 1.6 mi from [facility], can come once every two weeks  9:30am-5:00pm [employer], [address] [place] [phone], [phone], 0.9 mi from [facility], can come four times per year, bring your photo ID and SS cards for other residents of household, will make appointments for those who work and need to come after 5pm  10:00am-12:00noon [employer], [address]  [place], [address], [phone], 1.7 mi from [facility], can come once every 60 days per household, need referral  from DSS, [employer], etc., bring photo ID and SS card  10:00am-1:00pm [facility], [id number],  [phone], 3.8 mi from [facility], with referral from DSS, may come six times, 30 days apart, bring your photo ID and SS cards for all residents of household   10:00am-1:00pm [address] the [facility], [address] [place], [address], [phone], 7.7 mi from [facility], can come once every thirty days with a referral from DSS, [employer], Mental Health, etc.- each referral good for six visits, bring photo ID   10:00am-1:00pm [address] [address], [phone], 4.2 mi from [facility], can come once every 6 months, open to [facility] residents, bring photo ID and copy of a current utility bill in your name, please call first to verify that food is available  2:00pm-3:30pm [facility], [address], [phone], [phone], 3.7 mi from [facility], can come twelve times per year, one bag per family, bring photo ID   FIRST AND THIRD Tuesdays  10:00am-1:00pm, [address] [place], [address], [phone], 7.2 mi from [facility], can come once every 30 days   Tuesday, WHEN FOOD IS AVAILABLE (call)  12:00noon-2:00pm [facility], [address], [address], [phone], 1.5 mi from  [facility], can come once every 30 days, bring photo ID   Tuesday, by APPOINTMENT ONLY  [facility], [address], [address],  [phone], 2.5 mi from [facility], call in advance for appointment between 1:00pm-4:00pm, bring your photo ID and SS cards for all residents of household, can come once every 3 months   [address] of [address], [address], [address], [phone], 5 mi from [facility], call one day ahead for appointment the next day between 10:00am and  12:00noon, can come once every 3 months, bring your photo ID and must qualify according to family income   [facility], [address] [address], [phone], 0.7 mi from [facility], call in advance for appointment, can come once every 30 days, bring your photo ID and SS cards for other residents of household  [address], [address], [address],  [phone], 5.1 mi from [facility], call between 9:00am and 1:00pm M-F to make appointment. Appointments are scheduled for Tues and Thurs from 10:00am-11:30am, bring photo ID, can come once every 6 months, limit three visits over 18 months, then must have referral  Wednesday  9:30am-5:00pm [facility], [address] [place] [phone], [phone], 0.9 mi from [facility], can come four times per year, bring your photo ID and SS cards for other residents of household, will make appointments for those who work and need to come after 5pm  9:30am-11:30am [employer] [facility], [address], [address], [phone], 6.6 mi from [facility], can come once every 30 days, bring your photo ID, and SS cards for other residents of household, each monthly visit requires a written referral from GUM or DSS with number in household on form  10:00am-12:00noon [address], [address] [place]  [place], [address], [phone], 1.7 mi from [facility], can come once every 60 days per household, need referral from DSS, [employer], etc., bring photo ID and SS card  10:00am-1:00pm [facility], [id number],  [phone], 3.9 mi from [facility], with referral from DSS, may come six times, 30 days apart, bring your photo ID and SS cards for all residents of household   10:00am-1:00pm [address] the [facility], [address] [place], [address], [phone], 7.7 mi from [facility], can come once every thirty days with a  referral from DSS, [employer], Mental Health, etc. - each referral good for six visits, bring photo ID   2:00pm-5:45pm [address] [place], [address], [phone], 3.2 mi from [facility], once every 30 days, first come/first served, limited to first 25, bring photo ID   6:30pm-8:30pm [facility], [address] [address], [phone], 3.2 mi from [facility], can come once every 30 days, maximum 6 times per year, bring your photo ID and SS numbers for other residents of household  FIRST and THIRD Wednesdays   9:00am-12:00noon, [facility], [address], [address], [phone], 4.2 mi from [facility], can come once a month. Please arrive and sign in no later than 11:15 so everyone can be served by 12 noon.  THIRD Wednesday  1:30pm-3:00pm, [address] [place], [address], [phone] or [phone], 2.1 mi from [facility]  Wednesday, by APPOINTMENT ONLY  [address] [facility], [address], [address], [phone], 5 mi from [facility], call one day ahead for appointment the next day between 10:00am and 12:00noon, can come once every 3 months, bring your photo ID and must qualify according to family income   [facility], [address] [address], [phone], 0.7 mi from [facility], call in advance for appointment, can come once every 30 days, bring your photo ID and SS cards for other residents of household   [address] of [place], [address] [place], [address], [phone], 3.2 mi from [facility], call in advance for appointment at 7:00pm, must provide valid photo ID  Thursday  9:00am-12:00noon [employer], [address], [address], [phone], 1.3 mi from [facility], can come once every 3 months, bring your photo ID and SS  numbers for other residents of household   9:00am-1:00pm [facility] [address],  [place],  [address], [phone]/Ext [phone], 1.6 mi from [facility], can come once every two weeks  9:30am-12:00noon [facility], [address], [address], [phone], 4.5 mi from [facility], can come once every 30 days, must state income [closed Thanksgiving and week of Christmas]  9:30am-5:00pm [employer], [address] [place] [phone], [phone], 0.9 mi from [facility], can come four times per year, bring your photo ID and SS card for other residents of household, will make appointments for those who work and need to come after 5pm  10:00am-1:00pm [employer], [address] [place], [address],  [phone], 3.9 mi from [facility], with referral from DSS, may come six times, 30 days apart, bring your photo ID and SS cards for all residents of household   10:00am-1:00pm [address] the [facility], [address] [place], [address], [phone], 7.7 mi from [facility], can come once every thirty days with a referral from DSS, [employer], Mental Health, etc.- each referral good for six visits, bring photo ID   10:00am-1:00pm [facility], [address], [address], [phone], 4.2 mi from [facility], can come once every 6 months, open to [facility] residents, bring photo ID and copy of a current utility bill in your name, please call first to verify that food is available   SUNDAYS BREAKFAST TWO LOCATIONS: 8:00am served in [facility] by Awaken PPL Corporation 8:30am SHUTTLE provided from [facility], served at [employer], [address] LUNCH TWO LOCATIONS [plus one additional third Sunday only] 10:30am - 12:30pm served at [employer], [employer], [place] [address] (1.2 miles from [facility]) 12:30pm served in [place] by Land O'Lakes Team (THIRD Sunday only) 1:30pm served at [facility] by [facility] one  location [plus one additional third Sunday only] 5:00pm Every Sunday, served under the bridge at [address] by Lige Reeve Under the 3M Company (.7 miles from [facility]) (THIRD Sunday ONLY) 4:00pm served in the parking garage, across from Nuc[employer] Corporation, corner of [place] and [place] by Ryland Group Works Ministries MONDAYS BREAKFAST 7:30am served in [employer] by the United States [employer] Corporation and Friends LUNCH 10:30am - 12:30pm served at [employer], [employer], [place] [address] (1.2 miles from [facility]) DINNER TWO LOCATIONS: 7:00pm served in front of the courthouse at the corner of Goldman Sachs and Phelps Dodge. by [place] Monday Night Meal (3 blocks from [facility]) 4:30pm served at the [employer]ation, [address] by Bank of New York Company (0.6 miles from [facility] ) TUESDAYS BREAKFAST 8:00am - 9:00am served at The TJX Companies, [address] (0.3 miles from [place]) LUNCH 10:30am - 12:30pm served at the [employer], [employer] [address], (1.2 miles from [place]) DINNER 6:00pm served at CSX Corporation, enter from Capital One and go to the Sonic Automotive, (0.7 miles from [place]) [facility] BREAKFAST 7:00am - 8:00am served at [employer], [employer] [address] (1.2 miles from [place]) LUNCH ONE LOCATION [plus two additional locations listed below] 10:30am - 12:30pm served at [employer], [employer] [address], (1.2 miles from [place]) (FIRST Wednesday ONLY) 11:30am served at Dillard's, [place] [address] (6.6 miles from [place]) (SECOND Wednesday ONLY) 11:00am served at [place]. [place] of [address], [address] (1.3 miles from [place]) [place] TWO LOCATIONS 6:00pm served at W. R. Berkley, [place] W. Visteon Corporation.  (1.3 miles from [facility]) 4:00pm - 6:00pm (hot dogs and chips) served at Levi Strauss of [facility], [address] (1.7 miles from [place]) [place] BREAKFAST NOT AVAILABLE AT THIS TIME LUNCH 10:30am - 12:30pm served at [employer], [employer], [place] [address], (1.2 miles from [place]) DINNER 6:00pm served at [employer] Corporation, enter from Capital One and go to the Sonic Automotive, (0.7 miles from Nucor Corporation) [place] BREAKFAST NOT AVAILABLE AT THIS TIME LUNCH 10:30am - 12:30pm served at [employer], [employer] [address] (1.2 miles from [place]) DINNER TWO LOCATIONS, [plus one additional first Friday only] 6:00pm served under the bridge at [address] by Lige Reeve Under [employer] Corporation. (.7 miles from [facility]) 5:00pm - 7:00pm served at Levi Strauss of [facility], [address] (1.7 miles from [place]) (FIRST Friday ONLY) 5:45 pm - SHUTTLE provided from the LIBRARY at 5:45pm. Served at [facility], [address]. SATURDAYS BREAKFAST TWO LOCATIONS [plus one additional last Saturday only] 8:00am served at [facility] by Delphi 8:30am served at [employer], [address] (2.2 miles from [facility]) (LAST Saturday ONLY) 8:30am served at [employer] Homes, [address] (5 miles from [place]) LUNCH 10:30am - 12:30pm served at [employer], [employer] [address] (1.2 miles from [facility]) DINNER 6:00pm served under the bridge at [address] by World Fuel Services Corporation (0.7 miles from Nucor Corporation)  DIRECTIONS FROM [place] TO ALL MEAL LOCATIONS The Bridge at [address] (.7 miles from [address]) [address] on [place]. Turn Right onto DIRECTV 433  ft. Continue onto [address] under bridge, about 500 ft. Courthouse (3 blocks from [facility]) [place] on [address]. Turn right on [address]  1 block to PPL Corporation (.5 miles from Banks) [place] on [place]. YRC Worldwide. past Brink's Company to EMCOR. Enter from Capital One and go to the Affiliated Computer Services building W. R. Berkley [address] Visteon Corporation. (1.3 miles from [facility]) [address] on [place]. Turn Right onto [address]. Mel[address]. church will be on the Left. The TJX Companies [address] (.3 miles from [facility]) Go .3 miles on W. [address] Destination is on your right Dillard's at ONEOK (6.6 miles from [address]) [address] on [place] toward W [address] Turn right onto [address] Continue onto Alcoa Inc. Continue onto Toll Brothers. 5. Latina Pol is on right Sanmina-SCI Futures trader) [address] (.6 miles from [place]) [place] on [place]. [address] Turn Left onto [address] 0.3 miles Destination is on the Left. [place] Black & Decker at American Express (5 miles from Nucor Corporation) 1. Head south on [address]. Turn right onto W  Friend[address] Turn slightly left onto Becton, Dickinson and Company onto Quest Diagnostics Turn right at Barnes & Noble Continue to church on right [place] Sounds of [facility] [address] (1.7 miles from [place]) [address] on [place] 1.4 miles [place] becomes [place]. [address] Continue 0.6 miles and church will be on theright. [place] Guardian Life Insurance at [address] (2.5 miles from Nucor Corporation) [place] provided from Massachusetts Mutual Life Park] [place] on [place]. Elm toward Estée Lauder right onto Costco Wholesale left onto Emerson Electric Turn left onto Micron Technology [address] [place]  [address] (2.2 miles from [address]) [address] on [place] 1.4 miles [place] becomes [place]. Elm  [address] Turn right onto [address] [place]  [address] and church will be on the Left. Potter's House/[place] AT&T [address] (1.2 miles from [facility]) 1.Turn right onto [facility] [place] 2.Turn left onto [place] 3.[place] 4.Destination is on your right [place]. [place] of [address] at ToysRus (1.3 miles from [facility]) [address] on [address] Turn left onto Genuine Parts right onto S. [place]. Continue onto KB Home	[place]. Turn left onto Smurfit-Stone Container. Turn right onto WellPoint.

## 2024-02-25 NOTE — Plan of Care (Signed)
 Spoke with Dr. Malvin, podiatry, regarding patients MRI findings and concern for osteomyelitis. He does not believe this is something that will require surgical intervention. Recommended continuing antibiotic treatment and following up with podiatry outpatient.     Gloriann Ogren, MD 9:38 AM

## 2024-02-25 NOTE — Progress Notes (Signed)
 PT Cancellation Note  Patient Details Name: [name] MRN: [id number] DOB: 28-Jan-1971   Cancelled Treatment:    Reason Eval/Treat Not Completed: Pt received in bathroom, stating I'm not ready yet [for PT]. Pt reports trying to have a BM. Will check back tomorrow for readiness to participate.    [name] 02/25/2024, 2:24 PM  Leita Sable, PT, DPT Acute Rehabilitation Services Secure Chat Preferred Office: [phone]

## 2024-02-25 NOTE — Plan of Care (Signed)

## 2024-02-25 NOTE — Progress Notes (Signed)
 OT Cancellation Note  Patient Details Name: [name] MRN: [id number] DOB: [birth year]   Cancelled Treatment:    Reason Eval/Treat Not Completed: Other (comment). Pt reports she is too tired and exhausted from lack of sleep the last 2 days and requests therapy come back later. She agreed to 14:15 this PM.  Donny BECKER OT Acute Rehabilitation Services Office [phone]    [place] Dorothyann Distel 02/25/2024, 11:33 AM

## 2024-02-25 NOTE — Progress Notes (Signed)
 OT Cancellation Note  Patient Details Name: [name] MRN: [id number] DOB: 25-May-1970   Cancelled Treatment:    Reason Eval/Treat Not Completed: Other (comment). Pt in bathroom and reports she will be in there a little while. OT or PT will try back tomorrow.  Donny BECKER OT Acute Rehabilitation Services Office [phone]    [place] Dorothyann Distel 02/25/2024, 2:23 PM

## 2024-02-25 NOTE — Assessment & Plan Note (Addendum)
 CBGs in range, only received 3u SSI -Continue with 20U lantus  BID with moderate SSI - CBG TID & at bedtime

## 2024-02-25 NOTE — Assessment & Plan Note (Addendum)
 Patient appears well, foot warm and well perfused with soft tissue swelling and TTP over the dorsum but otherwise exam reassuring. VSS.  -Bcx no growth 24h -De escalate antibiotics to cefadroxil  500mg  BID for 6 weeks - Pain:  - Tylenol  1000 q6h sch  - oxycodone  5 mg q6h prn for breakthrough pain  - f/u ABIs. Consider VVS consult if abnormal  - DVT US  R LE WNL - PT/OT recs pending

## 2024-02-25 NOTE — Assessment & Plan Note (Addendum)
 Cr downtrending today.  Cautious with fluids given HFrEF  - avoid nephrotoxic agents

## 2024-02-26 ENCOUNTER — Other Ambulatory Visit (HOSPITAL_COMMUNITY): Payer: Self-pay

## 2024-02-26 LAB — GLUCOSE, CAPILLARY
Glucose-Capillary: 184 mg/dL — ABNORMAL HIGH (ref 70–99)
Glucose-Capillary: 163 mg/dL — ABNORMAL HIGH (ref 70–99)

## 2024-02-26 MED ORDER — GABAPENTIN 100 MG PO CAPS: 100.0000 mg | ORAL_CAPSULE | Freq: Three times a day (TID) | ORAL | Status: AC

## 2024-02-26 MED ORDER — ACETAMINOPHEN 500 MG PO TABS: 1000.0000 mg | ORAL_TABLET | Freq: Four times a day (QID) | ORAL | Status: AC

## 2024-02-26 MED ORDER — CEFADROXIL 500 MG PO CAPS
500.0000 mg | ORAL_CAPSULE | Freq: Two times a day (BID) | ORAL | 0 refills | Status: AC
  Filled 2024-02-26: qty 80, 40d supply, fill #0

## 2024-02-26 NOTE — Assessment & Plan Note (Deleted)
 VSS Bcx ngtd     Patient appears well, foot warm and well perfused with soft tissue swelling and TTP over the dorsum but otherwise exam reassuring. VSS.  -Bcx no growth 24h -De escalate antibiotics to cefadroxil  500mg  BID for 6 weeks - Pain:  - Tylenol  1000 q6h sch  - oxycodone  5 mg q6h prn for breakthrough pain  - f/u ABIs. Consider VVS consult if abnormal  - DVT US  R LE WNL - PT/OT recs pending

## 2024-02-26 NOTE — Progress Notes (Signed)
 DISCHARGE NOTE HOME ROSEANNE JUENGER to be discharged Home per MD order. Discussed prescriptions and follow up appointments with the patient. Prescriptions given to patient; medication list explained in detail. Patient verbalized understanding.  Skin clean, dry and intact without evidence of skin break down, no evidence of skin tears noted. IV catheter discontinued intact. Site without signs and symptoms of complications. Dressing and pressure applied. Pt denies pain at the site currently. No complaints noted.  Patient free of lines, drains, and wounds.   An After Visit Summary (AVS) was printed and given to the patient. Patient escorted via wheelchair, and discharged home via private auto.  Peyton SHAUNNA Pepper, RN

## 2024-02-26 NOTE — Discharge Summary (Addendum)
 "  Family Medicine Teaching [facility] Discharge Summary  Patient name: [name] Medical record number: [id number] Date of birth: 04/01/1970 Age: 53 y.o. Gender: female Date of Admission: 02/23/2024  Date of Discharge: 02-26-24 Admitting Physician: Winn Ogren, MD  Primary Care Provider: Theophilus Pagan, MD Consultants: Podiatry  Indication for Hospitalization: Cellulitis/osteomyelitis  Discharge Diagnoses/Problem List:  Principal Problem for Admission: Cellulitis Other Problems addressed during stay:  Principal Problem:   Cellulitis of right foot Active Problems:   T2DM (type 2 diabetes mellitus) (HCC) Osteomyelitis of R third toe  Brief Hospital Course:  [name] is a 53 y.o.female with a history of T2DM, HTN, HFrEF, tardive dyskinesia, bipolar disorder, asthma who was admitted to the [facility] Medicine Teaching Service at [facility] for right foot swelling and pain. Her hospital course is detailed below:  Right Foot Osteomyelitis Patient treated with keflex  outpatient for apparent cellulitis, failed treatment and admitted for IV antibiotic therapy. Patient was started on IV unasyn  and medications for pain control. ABIs showed abnormal flow to the R big toe but otherwise WNL. DVT ultrasound negative. MRI concerning for bone contusions vs early osteomyelitis. Podiatry was consulted who did not have any recommendations for surgical intervention and agreed with antibiotic course for osteomyelitis. Patient stable for discharge and was sent with 6 weeks of cefadroxil  (last dose 04/07/24) .   Other chronic conditions were medically managed with home medications and formulary alternatives as necessary (T2DM)  PCP Follow-up Recommendations: Ensure patient follows up with podiatry outpatient  Ensure outpatient PT follow up Ensure completion of full course of antibiotics (12/18 - 1/29)      Results/Tests Pending at Time of Discharge:  Unresulted Labs (From admission,  onward)     Start     Ordered   03/02/24 0500  Creatinine, serum  (enoxaparin  (LOVENOX )    CrCl >/= 30 ml/min)  Weekly,   R     Comments: while on enoxaparin  therapy    02/24/24 0940           Disposition: Home  Discharge Condition: VSS, improved ambulation  Discharge Exam:  Vitals:   02/26/24 0759 02/26/24 0806  BP: 126/79   Pulse: 81 84  Resp: 18 20  Temp: 98.9 F (37.2 C)   SpO2: 100% 98%   General: Awake, alert, NAD. Communicates clearly. Cardio: RRR. Normal S1, S2. No murmur, rub, gallop. 2+ radial and dorsalis pedis pulses b/l w/ good capillary refill.  Resp: CTA bilaterally. No wheezes, rales, or rhonchi. Normal work of breathing on room air Abdomen: soft, non-tender, non-distended.  Extremities: Swelling and TTP over the dorsum of the R midfoot and base of the toes. Sole of the foot without TTP. Pt has intact ROM.  Skin: No lesions or notable skin breakdown appreciated on R foot or between toes. Skin appears intact and well hydrated.   Significant Procedures: None  Significant Labs and Imaging:  Recent Labs  Lab 02/25/24 0520  WBC 6.4  HGB 9.3*  HCT 30.1*  PLT 366   Recent Labs  Lab 02/25/24 0520  NA 142  K 4.7  CL 110  CO2 25  GLUCOSE 134*  BUN 44*  CREATININE 2.26*  CALCIUM  9.2  MG 1.8   DVT US  LE: No evidence of DVT  R foot Xray IMPRESSION: Soft tissue swelling about the dorsum of the foot. No radiographic findings of osteomyelitis, at this time.  MRI R foot IMPRESSION: 1. Edema in the fused middle and distal phalanx of the third toe,  suspicious for acute osteomyelitis. 2. Subcutaneous edema along the dorsum of the foot extending to the bases of the toes, potentially from cellulitis. 3. Minimal degenerative spurring at the first metatarsophalangeal joint. 4. Regional muscular atrophy.  ABI:  Right: Resting right ankle-brachial index is within normal range. The  right toe-brachial index is abnormal.    Left: Resting left  ankle-brachial index is within normal range. The left  toe-brachial index is normal.   Discharge Medications:  Allergies as of 02/26/2024       Reactions   Ace Inhibitors Other (See Comments)   Patient had acute renal failure requiring hemodialysis after a suicide attempt.  Nephro recommended avoiding use.   Haldol  [haloperidol  Lactate] Other (See Comments)   Tardive dyskinesia   Nsaids Other (See Comments)   Nephro recommended avoiding after acute renal failure requiring hemodialysis associated with a suicide attempt.   Entresto  [sacubitril-valsartan] Itching   Latex Other (See Comments)   Rash around IV site        Medication List     STOP taking these medications    cephALEXin  500 MG capsule Commonly known as: KEFLEX        TAKE these medications    benztropine  0.5 MG tablet Commonly known as: COGENTIN  Take 1 tablet (0.5 mg total) by mouth 2 (two) times daily. The timing of this medication is very important.   acetaminophen  500 MG tablet Commonly known as: TYLENOL  Take 2 tablets (1,000 mg total) by mouth every 6 (six) hours.   albuterol  108 (90 Base) MCG/ACT inhaler Commonly known as: VENTOLIN  HFA INHALE 2 PUFFS BY MOUTH EVERY 6 HOURS AS NEEDED FOR SHORTNESS OF BREATH   Black Cohosh 450 MG Caps Take 450 mg by mouth at bedtime.   cariprazine  1.5 MG capsule Commonly known as: Vraylar  Take 1 capsule (1.5 mg total) by mouth daily.   CBD4 Freeze Pump Vanish Scent 4-1 % Crea Generic drug: Lidocaine -Menthol Apply 1 Application topically daily as needed (pain).   cefadroxil  500 MG capsule Commonly known as: DURICEF Take 1 capsule (500 mg total) by mouth 2 (two) times daily.   ferrous sulfate  325 (65 FE) MG tablet Take 1 tablet (325 mg total) by mouth daily with breakfast.   FLUoxetine  40 MG capsule Commonly known as: PROZAC  Take 1 capsule (40 mg total) by mouth daily.   fluticasone  50 MCG/ACT nasal spray Commonly known as: FLONASE  Place 1 spray into  both nostrils daily. 1 spray in each nostril every day   fluticasone -salmeterol 250-50 MCG/ACT Aepb Commonly known as: Wixela Inhub Inhale 1 puff into the lungs in the morning and at bedtime.   furosemide  40 MG tablet Commonly known as: LASIX  Take 1 tablet (40 mg total) by mouth daily.   gabapentin  100 MG capsule Commonly known as: NEURONTIN  Take 1 capsule (100 mg total) by mouth 3 (three) times daily. What changed:  medication strength how much to take   hydrOXYzine  50 MG tablet Commonly known as: ATARAX  Take 1 tablet (50 mg total) by mouth 3 (three) times daily as needed for anxiety.   ipratropium-albuterol  0.5-2.5 (3) MG/3ML Soln Commonly known as: DUONEB Take 3 mLs by nebulization 3 (three) times daily as needed. What changed: reasons to take this   isosorbide -hydrALAZINE  20-37.5 MG tablet Commonly known as: BIDIL  Take 1 tablet by mouth 3 (three) times daily.   Lantus  SoloStar 100 UNIT/ML Solostar Pen Generic drug: insulin  glargine INJECT 65 UNITS SUBCUTANEOUSLY TWICE DAILY What changed: See the new instructions.   levothyroxine  300 MCG  tablet Commonly known as: SYNTHROID  Take 1 tablet (300 mcg total) by mouth daily at 6 (six) AM.   lidocaine  4 % Place 1 patch onto the skin daily as needed (for pain).   losartan  25 MG tablet Commonly known as: COZAAR  Take 1 tablet (25 mg total) by mouth at bedtime.   methocarbamol  500 MG tablet Commonly known as: ROBAXIN  TAKE 2 TABLETS BY MOUTH THREE TIMES DAILY   metoprolol  succinate 50 MG 24 hr tablet Commonly known as: TOPROL -XL Take 1 tablet (50 mg total) by mouth at bedtime. Take with or immediately following a meal.   mirtazapine  15 MG tablet Commonly known as: REMERON  Take 1 tablet (15 mg total) by mouth at bedtime.   Mounjaro  15 MG/0.5ML Pen Generic drug: tirzepatide  Inject 15 mg into the skin once a week.   NovoLOG  FlexPen 100 UNIT/ML FlexPen Generic drug: insulin  aspart INJECT 15 UNITS SUBCUTANEOUSLY WITH  LUNCH , AND THEN INJECT 20 UNITS WITH SUPPER   oxyCODONE -acetaminophen  5-325 MG tablet Commonly known as: PERCOCET/ROXICET Take 1 tablet by mouth every 6 (six) hours as needed for severe pain (pain score 7-10).   pantoprazole  40 MG tablet Commonly known as: PROTONIX  Take 1 tablet (40 mg total) by mouth daily.   QUEtiapine  Fumarate 150 MG Tabs Take 150 mg by mouth at bedtime.   rosuvastatin  20 MG tablet Commonly known as: Crestor  Take 1 tablet (20 mg total) by mouth daily.        Discharge Instructions: Please refer to Patient Instructions section of EMR for full details.  Patient was counseled important signs and symptoms that should prompt return to medical care, changes in medications, dietary instructions, activity restrictions, and follow up appointments.   Follow-Up Appointments:  Follow-up Information     Theophilus Pagan, MD Follow up.   Specialty: Family Medicine Contact information: [address] [place] [address] [phone]                 Leafy Scriver DO PGY-1, [facility] Family Medicine  "

## 2024-02-26 NOTE — Evaluation (Signed)
 Occupational Therapy Evaluation and Discharge Patient Details Name: [name] MRN: [id number] DOB: 11/23/1970 Today's Date: 02/26/2024   History of Present Illness   53 yo F adm 12/16 R foot injury discoloration with chills 12/17 xray shows osteomyelitis. PMHx: acute HF, anemia, asthma, anxiety, MDD with suicide attempt, DMII, HTN, lumbar DJD, sciatica with L radiculopathy, neuropathy, suicide attempt  2012 and 02/2022, DM2, depression, HTN, sciatica on right for to years now.     Clinical Impressions This 53 yo female admitted with above presents to acute OT with PLOF of having to do basic ADLs and ambulation in a modified way due to right sciatica pain. Now she also has the right foot pain. Currently she is at a CGA level with QC being up and about for mobility and ADLs--feel she would be Mod I at RW level. No further OT needs, we will sign off,     If plan is discharge home, recommend the following:   Assist for transportation;Assistance with cooking/housework     Functional Status Assessment   Patient has had a recent decline in their functional status and demonstrates the ability to make significant improvements in function in a reasonable and predictable amount of time.     Equipment Recommendations   None recommended by OT      Precautions/Restrictions   Precautions Precautions: Fall Restrictions Weight Bearing Restrictions Per Provider Order: No     Mobility Bed Mobility               General bed mobility comments: Pt up in recliner upon my arrival    Transfers Overall transfer level: Modified independent Equipment used: Quad cane               General transfer comment: Ambulated in hallway with QC with CGA, with RW she would be Mod I      Balance Overall balance assessment: Mild deficits observed, not formally tested (in standing)                                         ADL either performed or assessed with  clinical judgement   ADL Overall ADL's : Modified independent                                       General ADL Comments: from QC level, increased time and effort due to pain     Vision Patient Visual Report: No change from baseline              Pertinent Vitals/Pain Pain Assessment Pain Assessment: Faces Faces Pain Scale: Hurts little more Pain Location: right LE (sciatica), Pain Descriptors / Indicators: Grimacing, Guarding, Aching, Burning     Extremity/Trunk Assessment Upper Extremity Assessment Upper Extremity Assessment: Overall WFL for tasks assessed           Communication Communication Communication: No apparent difficulties   Cognition Arousal: Alert Behavior During Therapy: WFL for tasks assessed/performed Cognition: No apparent impairments                               Following commands: Intact                  Home Living Family/patient expects to be discharged to:: Private  residence Living Arrangements: Children;Other relatives Available Help at Discharge: Family;Available PRN/intermittently Type of Home: House Home Access: Level entry     Home Layout: Multi-level Alternate Level Stairs-Number of Steps: 7 + 7 from (one set has rail on right and other set has rail on left)   Bathroom Shower/Tub: Chief Strategy Officer: Handicapped height     Home Equipment: Cane - Tefl Teacher (4 wheels);Shower seat;Cane - single point   Additional Comments: has not worked since last year, TA with special ed, W/C and rollator are borrowed      Prior Functioning/Environment Prior Level of Function : Needs assist;History of Falls (last six months)             Mobility Comments: has been using cane in house, rollator or WC when out of house due to being unable to get it in and out of house. 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 cannot stand to cook    OT Problem List: Impaired balance (sitting and/or standing);Obesity        OT Goals(Current goals can be found in the care plan section)   Acute Rehab OT Goals Patient Stated Goal: for sciatica pain to get better         AM-PAC OT 6 Clicks Daily Activity     Outcome Measure Help from another person eating meals?: None Help from another person taking care of personal grooming?: None Help from another person toileting, which includes using toliet, bedpan, or urinal?: None Help from another person bathing (including washing, rinsing, drying)?: None Help from another person to put on and taking off regular upper body clothing?: None Help from another person to put on and taking off regular lower body clothing?: None 6 Click Score: 24   End of Session Equipment Utilized During Treatment:  (QC)  Activity Tolerance: Patient tolerated treatment well Patient left: in chair;with call bell/phone within reach  OT Visit Diagnosis: Unsteadiness on feet (R26.81);Other abnormalities of gait and mobility (R26.89);History of falling (Z91.81);Pain Pain - Right/Left: Right Pain - part of body: Leg;Ankle and joints of foot                Time: 9263-9193 OT Time Calculation (min): 30 min Charges:  OT General Charges $OT Visit: 1 Visit OT Treatments $Self Care/Home Management : 23-37 mins  Donny BECKER OT Acute Rehabilitation Services Office [phone]    [place] Dorothyann Distel 02/26/2024, 8:27 AM

## 2024-02-26 NOTE — Evaluation (Signed)
 Physical Therapy Evaluation Patient Details Name: [name] MRN: [id number] DOB: 12-04-1970 Today's Date: 02/26/2024  History of Present Illness  Pt is a 53 yo F admitted on 02/24/24 after presenting with c/o R 3rd toe pain & swelling. Pt is being treated for cellulitis vs osteomyelitis. PMH: DM2, HTN, HFrEF, neuropathic pain, chronic low back pain with L sided sciatica, CHF, HLD, asthma, MDD with suicide attempt, CKD 3B, HTN, obesity, OCD, GAD  Clinical Impression  Pt seen for PT evaluation with pt agreeable to tx. Pt very pleasant, eager to participate, appreciative of education. Pt reports she ambulates with QC or RW in the home, rollator outside of the home, typically only stands ~1 minute before requiring seated rest break 2/2 LLE sciatic nerve pain. On this date, pt is able to ambulate with RW & mod I with decreased gait speed. Pt negotiates stairs with L rail, cuing re: use of BUE on Rails & compensatory pattern.  Recommend ongoing PT services to progress mobility as able.       If plan is discharge home, recommend the following: Help with stairs or ramp for entrance;Assist for transportation;Assistance with cooking/housework   Can travel by private vehicle        Equipment Recommendations None recommended by PT (pt has all DME needs)  Recommendations for Other Services       Functional Status Assessment Patient has had a recent decline in their functional status and demonstrates the ability to make significant improvements in function in a reasonable and predictable amount of time.     Precautions / Restrictions Precautions Precautions: Fall Restrictions Weight Bearing Restrictions Per Provider Order: No      Mobility  Bed Mobility               General bed mobility comments: not tested, pt received & left sitting in recliner    Transfers Overall transfer level: Modified independent Equipment used: Rolling walker (2 wheels)               General  transfer comment: sit<>stand from recliner    Ambulation/Gait Ambulation/Gait assistance: Modified independent (Device/Increase time) Gait Distance (Feet): 85 Feet (+ 15 ft + 15 ft + 85 ft) Assistive device: Rolling walker (2 wheels) Gait Pattern/deviations: Step-through pattern, Decreased step length - right, Decreased step length - left, Decreased stride length Gait velocity: decreased     General Gait Details: seated rest breaks PRN (pt reports she only stands for ~1 minute at a time, negotiates 1 set of stairs then takes a seated rest break 2/2 LLE sciatic nerve pain)  Stairs Stairs: Yes Stairs assistance: Contact guard assist Stair Management: One rail Left, Step to pattern Number of Stairs: 3 (6) General stair comments: PT provides demo & education re: stair negotiation with compensatory pattern with pt return demonstrating.  Wheelchair Mobility     Tilt Bed    Modified Rankin (Stroke Patients Only)       Balance Overall balance assessment: Needs assistance Sitting-balance support: No upper extremity supported Sitting balance-Leahy Scale: Normal     Standing balance support: During functional activity, Bilateral upper extremity supported Standing balance-Leahy Scale: Good                               Pertinent Vitals/Pain Pain Assessment Pain Assessment: 0-10 Pain Score: 8  Pain Location: top of R foot Pain Descriptors / Indicators: Discomfort, Grimacing Pain Intervention(s): Limited activity within patient's tolerance,  Repositioned, Monitored during session    Home Living Family/patient expects to be discharged to:: Private residence Living Arrangements: Children;Other relatives Available Help at Discharge: Family;Available PRN/intermittently Type of Home: House Home Access: Level entry     Alternate Level Stairs-Number of Steps: 7 + 7 from (one set has rail on right and other set has rail on left) Home Layout: Multi-level Home  Equipment: Cane - Tefl Teacher (4 wheels);Shower seat;Cane - single Librarian, Academic (2 wheels) Additional Comments: has not worked since last year, TA with special ed, W/C and rollator are borrowed    Prior Function Prior Level of Function : Needs assist;History of Falls (last six months)             Mobility Comments: has been using cane & RW in house, rollator or WC when out of house due to being unable to get it in and out of house. 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 cannot stand to cook     Extremity/Trunk Assessment   Upper Extremity Assessment Upper Extremity Assessment: Overall WFL for tasks assessed    Lower Extremity Assessment Lower Extremity Assessment: Overall WFL for tasks assessed;Generalized weakness (hx of LLE sciatic nerve pain, new R foot pain)       Communication   Communication Communication: No apparent difficulties    Cognition Arousal: Alert Behavior During Therapy: WFL for tasks assessed/performed   PT - Cognitive impairments: No apparent impairments                       PT - Cognition Comments: very pleasant, appreciative of education Following commands: Intact       Cueing Cueing Techniques: Verbal cues     General Comments      Exercises     Assessment/Plan    PT Assessment Patient needs continued PT services  PT Problem List Decreased strength;Pain;Decreased activity tolerance;Decreased balance;Decreased mobility;Decreased knowledge of use of DME;Decreased knowledge of precautions       PT Treatment Interventions DME instruction;Therapeutic exercise;Gait training;Balance training;Neuromuscular re-education;Stair training;Functional mobility training;Therapeutic activities;Patient/family education    PT Goals  (Current goals can be found in the Care Plan section)  Acute Rehab PT Goals Patient Stated Goal: decreased pain, return to independent PLOF PT Goal Formulation: With patient Time For Goal Achievement: 03/11/24 Potential to Achieve Goals: Fair    Frequency Min 2X/week     Co-evaluation               AM-PAC PT 6 Clicks Mobility  Outcome Measure Help needed turning from your back to your side while in a flat bed without using bedrails?: None Help needed moving from lying on your back to sitting on the side of a flat bed without using bedrails?: None Help needed moving to and from a bed to a chair (including a wheelchair)?: None Help needed standing up from a chair using your arms (e.g., wheelchair or bedside chair)?: None Help needed to walk in hospital room?: None Help needed climbing 3-5 steps with a railing? : A Little 6 Click Score: 23    End of Session   Activity Tolerance: Patient tolerated treatment well;Patient limited by pain;Patient limited by fatigue Patient left: in chair;with call bell/phone within reach Nurse Communication: Mobility status PT Visit Diagnosis: Pain;Other abnormalities of gait and mobility (R26.89);Difficulty in walking, not  elsewhere classified (R26.2) Pain - Right/Left: Right Pain - part of body: Ankle and joints of foot    Time: 9066-9050 PT Time Calculation (min) (ACUTE ONLY): 16 min   Charges:   PT Evaluation $PT Eval Low Complexity: 1 Low   PT General Charges $$ ACUTE PT VISIT: 1 Visit         Richerd Pinal, PT, DPT 02/26/2024, 10:05 AM   Richerd CHRISTELLA Pinal 02/26/2024, 10:02 AM

## 2024-02-26 NOTE — Plan of Care (Signed)

## 2024-02-26 NOTE — Assessment & Plan Note (Deleted)
 CBGs in range, only received 3u SSI -Continue with 20U lantus  BID with moderate SSI - CBG TID & at bedtime

## 2024-02-28 LAB — CULTURE, BLOOD (ROUTINE X 2): Culture: NO GROWTH

## 2024-02-29 ENCOUNTER — Other Ambulatory Visit: Payer: Self-pay | Admitting: Family Medicine

## 2024-02-29 ENCOUNTER — Ambulatory Visit

## 2024-02-29 DIAGNOSIS — D509 Iron deficiency anemia, unspecified: Secondary | ICD-10-CM

## 2024-02-29 LAB — CULTURE, BLOOD (ROUTINE X 2): Culture: NO GROWTH

## 2024-03-01 ENCOUNTER — Ambulatory Visit: Payer: Self-pay | Admitting: Family Medicine

## 2024-03-01 VITALS — BP 133/81 | HR 90 | Ht 71.0 in | Wt 336.4 lb

## 2024-03-01 DIAGNOSIS — L03115 Cellulitis of right lower limb: Secondary | ICD-10-CM

## 2024-03-01 MED ORDER — FLUCONAZOLE 150 MG PO TABS: 150.0000 mg | ORAL_TABLET | Freq: Once | ORAL | 0 refills | Status: AC

## 2024-03-01 NOTE — Progress Notes (Signed)
" ° ° °  SUBJECTIVE:   CHIEF COMPLAINT / HPI:  Hospital follow-up  Discussed the use of AI scribe software for clinical note transcription with the patient, who gave verbal consent to proceed.  History of Present Illness [name] is a 53 year old female who presents with a foot infection and ongoing antibiotic treatment.  Foot infection - Initial symptoms included swelling, purple discoloration, and pain in the foot - Hospitalized for 24 hours due to development of fever and chills - Currently taking antibiotics for infection - Constant chills persist, but no current fever - Monitors foot daily for new wounds and photographs changes  Antibiotic side effects - Strong odor in urine attributed to antibiotic therapy  Physical therapy interruption - Physical therapy canceled due to need for new referral - Previously attended outpatient therapy on [address] - Eager to resume therapy once referral is obtained  Chronic sciatica - Chronic pain due to sciatica    Discharge summary recs: Ensure patient follows up with podiatry outpatient  Ensure outpatient PT follow up Ensure completion of full course of antibiotics (12/18 - 1/29)   PERTINENT  PMH / PSH: T2DM, sciatica and low back pain, IDA  OBJECTIVE:   BP 133/81   Pulse 90   Ht 5' 11 (1.803 m)   Wt (!) 336 lb 6.4 oz (152.6 kg)   SpO2 98%   BMI 46.92 kg/m   General: Alert, pleasant woman. NAD. HEENT: NCAT. MMM. CV: RRR, no murmurs. Cap refill <2. Resp: CTAB, no wheezing or crackles. Normal WOB on RA.  Abm: Soft, nontender, nondistended. BS present. Ext: R foot much improved with decreased swelling and no erythema. Skin: Warm, well perfused     ASSESSMENT/PLAN:   Assessment & Plan Cellulitis of right lower extremity Improving and stable. Will complete full 6wk course for potential osteomyelitis vs cellulitis. Provided new PT referral. Will f/u with podiatry.  - Also provided 1 time dose of diflucan  in case  pt develops yeast infection with antibiotics.      Twyla Nearing, MD [facility] "

## 2024-03-01 NOTE — Patient Instructions (Addendum)
 1) I sent a referral for you to go to physical therapy again   2) Your foot is looking so much better today!. Keep taking your antibiotics and do stretching exercises to get blood flowing back to your foot. - Check your foot daily for new wounds - If you find anything weird, take photos so we can keep track of wounds or infections.

## 2024-03-02 NOTE — Assessment & Plan Note (Signed)
 Improving and stable. Will complete full 6wk course for potential osteomyelitis vs cellulitis. Provided new PT referral. Will f/u with podiatry.  - Also provided 1 time dose of diflucan  in case pt develops yeast infection with antibiotics.

## 2024-03-07 ENCOUNTER — Telehealth: Payer: Self-pay

## 2024-03-07 NOTE — Telephone Encounter (Signed)
 Patient calls nurse line requesting to schedule visit for fall. She fell off her porch last Monday night.   She denies head injury or LOC. She reports that she hit her left wrist and knee.   Scheduled visit for tomorrow morning for further evaluation.   Chiquita JAYSON English, RN

## 2024-03-08 ENCOUNTER — Ambulatory Visit: Payer: Self-pay | Admitting: Student

## 2024-03-08 ENCOUNTER — Ambulatory Visit
Admission: RE | Admit: 2024-03-08 | Discharge: 2024-03-08 | Disposition: A | Discharge status: Home / Self Care | Source: Ambulatory Visit | Attending: Family Medicine | Admitting: Family Medicine

## 2024-03-08 ENCOUNTER — Other Ambulatory Visit: Payer: Self-pay | Admitting: Family Medicine

## 2024-03-08 ENCOUNTER — Encounter: Payer: Self-pay | Admitting: Student

## 2024-03-08 VITALS — BP 127/81 | HR 99 | Ht 71.0 in | Wt 329.8 lb

## 2024-03-08 DIAGNOSIS — M79642 Pain in left hand: Secondary | ICD-10-CM

## 2024-03-08 DIAGNOSIS — M25562 Pain in left knee: Secondary | ICD-10-CM

## 2024-03-08 DIAGNOSIS — M25462 Effusion, left knee: Secondary | ICD-10-CM | POA: Diagnosis not present

## 2024-03-08 DIAGNOSIS — D509 Iron deficiency anemia, unspecified: Secondary | ICD-10-CM

## 2024-03-08 NOTE — Patient Instructions (Signed)
 I have ordered an xray you can get this across [address] at the following address:  [facility] [address]  [phone] You do not need an appointment.    For your pain you can try several over the counter topical medications. Topical medications are a great option to treat pain as you can place the medication right where you are hurting and they have less side effects.   Voltaren  Gel/Diclofenac  Gel: this is an anti-inflammatory cream (same ingredient as Motrin /Ibuprofen /Aleve ) can be applied up to 4 times per day.     Lidocaine  patch: can be applied for 12 hours and after needs a 12 hour break to continue to be effective. Works by using numbing medication to help with pain relief. Can be found at any pharmacy over the counter or on [email or web address]    Capsaicin cream: made from the same ingredient that makes hot peppers spicy, this works by numbing up the area of pain. Please wash your hands and do not touch your eyes after applying the cream as active ingredient can hurt your eyes.

## 2024-03-08 NOTE — Progress Notes (Signed)
" ° ° °  SUBJECTIVE:   CHIEF COMPLAINT / HPI:   [name] is a 53 y.o. female presenting for left knee and left hand pain after a fall.   - fell on Tuesday night  - she was walking on her porch and lost her balance  - fell onto her left knee and wrist - has been able to use her hand and legs  - has not fallen since  - able to bear weight and ambulate at baseline  - denies syncopal event during fall and chest pain   PERTINENT  PMH / PSH: reviewed and updated.  OBJECTIVE:   BP 127/81   Pulse 99   Ht 5' 11 (1.803 m)   Wt (!) 329 lb 12.8 oz (149.6 kg)   SpO2 100%   BMI 46.00 kg/m   Chronically ill-appearing, no acute distress Cardio: Regular rate, regular rhythm, no murmurs on exam. Pulm: Clear, no wheezing, no crackles. No increased work of breathing Abdominal: bowel sounds present, soft, non-tender, non-distended MSK:  No obvious deformity of the wrist or knees  Some ecchymosis noted on the medial side of her left knee  Tenderness to palpation even with light touch to the knee and wrist Radial pulse on palpation +2  ASSESSMENT/PLAN:   Assessment & Plan Pain and swelling of left knee Pain in left hand No acute deformity and able to use with normal ROM on exam. Appears to be a mechanical fall without signs of precipitating event based on patient history. Discussed topical analgesics  Ordered DG left knee and wrist to assess for fracture after fall  Continue with PT as previously ordered to improve mobility and balance      Damien Pinal, DO [facility]  "

## 2024-03-14 ENCOUNTER — Telehealth: Payer: Self-pay

## 2024-03-14 DIAGNOSIS — E1140 Type 2 diabetes mellitus with diabetic neuropathy, unspecified: Secondary | ICD-10-CM

## 2024-03-14 DIAGNOSIS — G629 Polyneuropathy, unspecified: Secondary | ICD-10-CM

## 2024-03-14 DIAGNOSIS — B3731 Acute candidiasis of vulva and vagina: Secondary | ICD-10-CM

## 2024-03-14 DIAGNOSIS — G8929 Other chronic pain: Secondary | ICD-10-CM

## 2024-03-14 DIAGNOSIS — M79604 Pain in right leg: Secondary | ICD-10-CM

## 2024-03-14 NOTE — Telephone Encounter (Signed)
 Patient LVM on nurse line regarding concerns with yeast infection.   She reports that she has been prescribed 6-8 weeks of antibiotics and was given 1 tablet of diflucan  on 03/01/24.  She is asking if she can receive additional treatment for yeast as she feels that she has developed another infection.   Attempted to call patient to obtain more information on symptoms. She did not answer and VM was not set up.   Chiquita JAYSON English, RN

## 2024-03-15 NOTE — Telephone Encounter (Signed)
 Patient returns call to nurse line.   She reports she is on continued antibiotics for the next several weeks.   She reports she was only given one diflucan . She reports relief when she took this, however the yeast infection is back.   She reports rawness and itchy white discharge.   She is requesting multiple diflucan  to take throughout the course of antibiotic use.   Advised will forward to PCP.

## 2024-03-17 MED ORDER — FLUCONAZOLE 150 MG PO TABS: ORAL_TABLET | ORAL | 0 refills | Status: DC

## 2024-03-17 NOTE — Telephone Encounter (Signed)
 Patient with persistent vaginal discharge while on chronic antibiotics for osteomyelitis.  Given history of CKD 4, will initiate weekly antifungal therapy for symptom management.  Fluconazole  150 mg once weekly x 3 weeks sent to pharmacy with instructions and advised the patient to call with any concerns.  Additionally sent MyChart message with information to patient.  Izetta Nap, DO

## 2024-03-19 ENCOUNTER — Ambulatory Visit (HOSPITAL_BASED_OUTPATIENT_CLINIC_OR_DEPARTMENT_OTHER): Payer: Self-pay | Admitting: Student

## 2024-03-29 ENCOUNTER — Encounter: Payer: Self-pay | Admitting: Family Medicine

## 2024-03-29 ENCOUNTER — Ambulatory Visit: Payer: Self-pay | Admitting: Family Medicine

## 2024-03-29 VITALS — BP 116/66 | HR 92 | Ht 71.0 in | Wt 324.8 lb

## 2024-03-29 DIAGNOSIS — Z23 Encounter for immunization: Secondary | ICD-10-CM | POA: Diagnosis not present

## 2024-03-29 DIAGNOSIS — N184 Chronic kidney disease, stage 4 (severe): Secondary | ICD-10-CM

## 2024-03-29 DIAGNOSIS — E039 Hypothyroidism, unspecified: Secondary | ICD-10-CM | POA: Diagnosis not present

## 2024-03-29 DIAGNOSIS — B3731 Acute candidiasis of vulva and vagina: Secondary | ICD-10-CM | POA: Diagnosis not present

## 2024-03-29 DIAGNOSIS — Z794 Long term (current) use of insulin: Secondary | ICD-10-CM | POA: Diagnosis not present

## 2024-03-29 DIAGNOSIS — E1122 Type 2 diabetes mellitus with diabetic chronic kidney disease: Secondary | ICD-10-CM

## 2024-03-29 DIAGNOSIS — L03119 Cellulitis of unspecified part of limb: Secondary | ICD-10-CM

## 2024-03-29 MED ORDER — LEVOTHYROXINE SODIUM 300 MCG PO TABS: 300.0000 ug | ORAL_TABLET | Freq: Every day | ORAL | 3 refills | Status: AC

## 2024-03-29 MED ORDER — FLUCONAZOLE 150 MG PO TABS: ORAL_TABLET | ORAL | 0 refills | Status: AC

## 2024-03-29 MED ORDER — SHINGRIX 50 MCG/0.5ML IM SUSR: INTRAMUSCULAR | 1 refills | Status: AC

## 2024-03-29 MED ORDER — FREESTYLE LIBRE 3 PLUS SENSOR MISC: 6 refills | Status: AC

## 2024-03-29 NOTE — Patient Instructions (Addendum)
 It was wonderful to see you today! Thank you for choosing [facility].   Please bring ALL of your medications with you to every visit.   Today we talked about:  You are doing very well on the antibiotics and your foot looks great!  Please continue the course and complete your antibiotics as prescribed.  I did send you in 3 more pills of the fluconazole  for yeast infection that you can take once per week to help get rid of your symptoms.  If you have symptoms beyond initiating medication please come back and see me as we may need to swab you and ensure you do not have another vaginal infection going on such as BV. Your A1c is doing great!  I did send in the continuous glucose sensors which hooked up to your phone and let you know your blood sugar at all times without having to check them.  If you get the box and you can set it up for yourself on your phone please do so otherwise you can return to our clinic and have an appointment with Dr. Koval and he will set it all up for you.  Once you have the sensor in place please monitor for any low blood sugars and you can titrate your mealtime insulin  as needed based on what your blood sugars are before mealtime. I sent in a year supply of your thyroid  medicines and the rest of your medicine should have a 27-month supply as well.  I also note this in your chart. Please obtain the shingles vaccine at the pharmacy with the prescription I provided you.  The RSV vaccine is not always covered but you can discuss with the pharmacist if you feel it is a good option for you.  Please follow up in 3 months   Call the clinic at [phone] if your symptoms worsen or you have any concerns.  Please be sure to schedule follow up at the front desk before you leave today.   Izetta Nap, DO Family Medicine

## 2024-03-29 NOTE — Assessment & Plan Note (Signed)
 Prior TSH normal, refill 1 year supply of levothyroxine 

## 2024-03-29 NOTE — Assessment & Plan Note (Signed)
 Resolved after hospitalization and antibiotic course.  Complete antibiotics as recommended by ID while inpatient.

## 2024-03-29 NOTE — Assessment & Plan Note (Signed)
 A1c 7.3, well-controlled on current regimen.  Discussed initiating CGM and patient agreeable.  Advised to pick up from pharmacy and attempt to set up on her own otherwise can schedule with Dr. Koval for CGM management.  Kidney function stable, will need referral to nephrology if progresses any further. -Start freestyle libre CGM

## 2024-03-29 NOTE — Progress Notes (Signed)
" ° ° °  SUBJECTIVE:   CHIEF COMPLAINT / HPI:   Discussed the use of AI scribe software for clinical note transcription with the patient, who gave verbal consent to proceed.  Right foot cellulitis - Recent hospitalization for severe foot infection characterized by pain, redness, swelling, chills, and severe pain. - Symptoms have improved following inpatient treatment. - On antibiotics until 04/07/2024  Vaginal yeast infection - Persistent genital yeast infection attributed to ongoing antibiotic use. - Weekly fluconazole  therapy has not resolved symptoms. - She plans to take her next dose today and has one pill remaining.  Chronic kidney disease - Chronic kidney disease with creatinine ranging from 2.2 to 2.4. - Concern about possible need for dialysis due to prior dialysis in 2012 after an overdose.  Diabetes Current Regimen: Mounjaro  15 mg weekly, NovoLog  15-20, Lantus  65 units twice daily  CBGs:  Last A1c:  Lab Results  Component Value Date   HGBA1C 7.3 (H) 02/22/2024    Denies polyuria, polydipsia, hypoglycemia. Last Eye Exam: DUE Statin: Rosuvastatin  20 mg daily ACE/ARB: Losartan  25 mg daily  PERTINENT  PMH / PSH: HTN, CHF, asthma, hypothyroidism, T2DM, chronic back pain, CKD 4, bipolar depression, HLD, IDA   OBJECTIVE:   BP 116/66   Pulse 92   Ht 5' 11 (1.803 m)   Wt (!) 324 lb 12.8 oz (147.3 kg)   SpO2 100%   BMI 45.30 kg/m    General: Alert, no apparent distress, well groomed HEENT: Normocephalic, atraumatic, moist mucus membranes, neck supple Respiratory: Normal respiratory effort MSK: Right foot: - No erythema, ecchymosis or deformity - No edema noted, third digit without wound or discoloration - 2+ dorsalis pedis and posterior tibialis pulses  ASSESSMENT/PLAN:   Assessment & Plan Type 2 diabetes mellitus with stage 4 chronic kidney disease, with long-term current use of insulin  (HCC) A1c 7.3, well-controlled on current regimen.  Discussed initiating  CGM and patient agreeable.  Advised to pick up from pharmacy and attempt to set up on her own otherwise can schedule with Dr. Koval for CGM management.  Kidney function stable, will need referral to nephrology if progresses any further. -Start freestyle libre CGM Cellulitis of foot Resolved after hospitalization and antibiotic course.  Complete antibiotics as recommended by ID while inpatient. Recurrent candidiasis of vagina Secondary to antibiotic use for right foot cellulitis, will complete antibiotics on 1/29.  Will continue fluconazole  treatment x 3 doses and recommend follow-up for vaginal swab if persistent symptoms after that time. -Fluconazole  150 mg weekly -Recommend wet prep if persistent symptoms Hypothyroidism, unspecified type Prior TSH normal, refill 1 year supply of levothyroxine  Encounter for immunization Received COVID shot, provided prescription for shingles shot   Dr. Izetta Nap, DO [facility]     "

## 2024-04-01 ENCOUNTER — Telehealth: Payer: Self-pay

## 2024-04-01 NOTE — Telephone Encounter (Signed)
 Received call from patient requesting follow up MRI for her sciatic pain and neuropathy in feet.   She is establishing care with a new neurologist in March and feels that this would be beneficial information for them to have.   Last MRI was in 06/2022.  Please advise if this is something that we would order or if patient should wait to see neurologist prior to new imaging being obtained.   Chiquita JAYSON English, RN

## 2024-04-05 NOTE — Telephone Encounter (Signed)
 Pt informed of below and understood.

## 2024-04-13 ENCOUNTER — Telehealth (INDEPENDENT_AMBULATORY_CARE_PROVIDER_SITE_OTHER): Admitting: Psychiatry

## 2024-04-13 ENCOUNTER — Encounter (HOSPITAL_COMMUNITY): Payer: Self-pay | Admitting: Psychiatry

## 2024-04-13 DIAGNOSIS — F32A Depression, unspecified: Secondary | ICD-10-CM | POA: Diagnosis not present

## 2024-04-13 DIAGNOSIS — F411 Generalized anxiety disorder: Secondary | ICD-10-CM

## 2024-04-13 MED ORDER — HYDROXYZINE HCL 50 MG PO TABS: 50.0000 mg | ORAL_TABLET | Freq: Three times a day (TID) | ORAL | 1 refills | Status: AC | PRN

## 2024-04-13 MED ORDER — MIRTAZAPINE 15 MG PO TABS: 15.0000 mg | ORAL_TABLET | Freq: Every day | ORAL | 1 refills | Status: AC

## 2024-04-13 MED ORDER — BENZTROPINE MESYLATE 1 MG PO TABS: 1.0000 mg | ORAL_TABLET | Freq: Two times a day (BID) | ORAL | 1 refills | Status: AC

## 2024-04-13 MED ORDER — CARIPRAZINE HCL 1.5 MG PO CAPS: 1.5000 mg | ORAL_CAPSULE | Freq: Every day | ORAL | 1 refills | Status: AC

## 2024-04-13 MED ORDER — QUETIAPINE FUMARATE 150 MG PO TABS: 150.0000 mg | ORAL_TABLET | Freq: Every day | ORAL | 1 refills | Status: AC

## 2024-04-13 MED ORDER — FLUOXETINE HCL 40 MG PO CAPS: 40.0000 mg | ORAL_CAPSULE | Freq: Every day | ORAL | 1 refills | Status: AC

## 2024-04-13 NOTE — Progress Notes (Signed)
 [facility] MD/PA/NP OP Progress Note Virtual Visit via Video Note  I connected with [name] on 04/13/24 at  3:30 PM EST by a video enabled telemedicine application and verified that I am speaking with the correct person using two identifiers.  Location: Patient:Home Provider: Clinic   I discussed the limitations of evaluation and management by telemedicine and the availability of in person appointments. The patient expressed understanding and agreed to proceed.  I provided 30 minutes of non-face-to-face time during this encounter.       04/13/2024 12:13 PM [name]  MRN:  [id number]  Chief Complaint: A lot is going on  HPI: 54 year old female seen today for follow up psychiatric evaluation. She has a psychiatric history of  OCD, adjustment disorder, anxiety, depression, SI, and bipolar disorder. She is currently being managed on Vraylar  1.5 mg, Prozac  40 mg daily, Seroquel  150 mg nightly, Gabapentin  300 mg three times daily as needed (prescribed by PCP),  Vistaril  50 mg three times daily, and mirtazapine  15 mg at bedtime. She informed clinical research associate that her medications are effective in managing her psychiatric conditions.   Today patient is well groomed, pleasant, and engaged in conversation. Patient notes that a lot has been going on. She reports that she is struggling. She notes that recently she was hospitalized due to having an infection in er foot. After her hospitalization she notes that she feel and needed EMS assistance to get her up.  She inform her that she continues to only be able to stand for 1 minute at a time.  She has appealed her disability as it was denied.  Patient reports that she is fearful that her long-term disability will run out.  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   Patient notes that the above worsens her anxiety and depression. Today provider conducted a GAD 7 and patient scored 10, at her last visit she scored  a 8. Provider also conducted a PHQ 9 and patient scored a 5, at her last visit she scored a 10. She notes that she adequate sleeps and appetite.  She denies SI/HI/VH, mania, or paranoia.  Patient reports she has tremoring in her hand.  She notes that Cogentin  is somewhat effective.  Today Cogentin  0.5 mg twice daily increased to 1 mg twice daily to help manage movements.  Hydroxyzine  50 mg 3 times daily increased to 50 to 100 mg 3 times a day to help manage anxiety.  She will continue her other medications as prescribed. She referred to outpatient counseling for therapy.  No other concerns at this time.       Visit Diagnosis:    ICD-10-CM   1. Generalized anxiety disorder  F41.1 hydrOXYzine  (ATARAX ) 50 MG tablet    QUEtiapine  Fumarate 150 MG TABS    mirtazapine  (REMERON ) 15 MG tablet    Ambulatory referral to Social Work    2. Severe recurrent major depressive disorder with psychotic features (HCC)  F33.3 FLUoxetine  (PROZAC ) 40 MG capsule    QUEtiapine  Fumarate 150 MG TABS    mirtazapine  (REMERON ) 15 MG tablet    cariprazine  (VRAYLAR ) 1.5 MG capsule    benztropine  (COGENTIN ) 1 MG tablet                Past Psychiatric History: OCD, adjustment disorder, anxiety, depression, SI, and bipolar disorder.   Past Medical History:  Past Medical History:  Diagnosis Date   Acute renal failure 05/27/2010   hemodialysis for  6 weeks   Anxiety    Asthma    Depression    Diabetes mellitus    Hypertension    Rhabdomyolysis 06/06/2010   after drug overdose   Suicide attempt by drug ingestion (HCC) 06/05/2010   result rhabdomyolosis and ARF requrining dialysis    SVT (supraventricular tachycardia) 05/28/2021    Past Surgical History:  Procedure Laterality Date   ANKLE ARTHROSCOPY Right 08/17/2015   Procedure: RIGHT ANKLE ARTHROSCOPY WITH SYNOVECTOMY AND LOOSE BODY EXCISION;  Surgeon: Maude Herald, MD;  Location: [facility] OR;  Service: Orthopedics;  Laterality: Right;   CHOLECYSTECTOMY      TUBAL LIGATION  1998    Family Psychiatric History: Unknown  Family History:  Family History  Problem Relation Age of Onset   Asthma Father     Social History:  Social History   Socioeconomic History   Marital status: Married    Spouse name: Not on file   Number of children: 2   Years of education: Not on file   Highest education level: Not on file  Occupational History   Occupation: research officer, trade union  Tobacco Use   Smoking status: Never    Passive exposure: Never   Smokeless tobacco: Never  Vaping Use   Vaping status: Never Used  Substance and Sexual Activity   Alcohol use: No   Drug use: No   Sexual activity: Yes    Comment: with husband   Other Topics Concern   Not on file  Social History Narrative   Married high school boyfriend at age 25, two boys, still married but he has been living with other women for years.She works 2 jobs, as a research officer, 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   Pt not working    Pt lives with family    Social Drivers of Health   Tobacco Use: Low Risk (04/13/2024)   Patient History    Smoking Tobacco Use: Never    Smokeless Tobacco Use: Never    Passive Exposure: Never  Financial Resource Strain: High Risk (06/19/2023)   Overall Financial Resource Strain (CARDIA)    Difficulty of Paying Living Expenses: Very hard  Food Insecurity: No Food Insecurity (02/24/2024)   Epic    Worried About Programme Researcher, Broadcasting/film/video in the Last Year: Never true    Ran Out of Food in the Last Year: Never true  Transportation Needs: Unmet Transportation Needs (02/24/2024)   Epic    Lack of Transportation (Medical): Yes    Lack of Transportation (Non-Medical): Yes  Physical Activity: Not on file  Stress: Not on file  Social Connections: Not on file  Depression (PHQ2-9): Medium Risk (04/13/2024)   Depression (PHQ2-9)    PHQ-2 Score: 5  Alcohol Screen: Low Risk (02/24/2022)   Alcohol Screen    Last Alcohol Screening Score (AUDIT): 0  Housing:  High Risk (02/24/2024)   Epic    Unable to Pay for Housing in the Last Year: Yes    Number of Times Moved in the Last Year: 0    Homeless in the Last Year: No  Utilities: Not At Risk (02/24/2024)   Epic    Threatened with loss of utilities: No  Health Literacy: Not on file    Allergies:  Allergies  Allergen Reactions   Ace Inhibitors Other (See Comments)    Patient had acute renal failure requiring hemodialysis after a suicide attempt.  Nephro recommended avoiding use.   Haldol  [Haloperidol  Lactate] Other (See Comments)    Tardive  dyskinesia   Nsaids Other (See Comments)    Nephro recommended avoiding after acute renal failure requiring hemodialysis associated with a suicide attempt.   Entresto  [Sacubitril-Valsartan] Itching   Latex Other (See Comments)    Rash around IV site    Metabolic Disorder Labs: Lab Results  Component Value Date   HGBA1C 7.3 (H) 02/22/2024   MPG 231.69 11/02/2023   MPG 174.29 07/09/2022   No results found for: PROLACTIN Lab Results  Component Value Date   CHOL 196 12/04/2023   TRIG 179 (H) 12/04/2023   HDL 45 12/04/2023   CHOLHDL 4.4 12/04/2023   VLDL 46 (H) 04/10/2015   LDLCALC 119 (H) 12/04/2023   LDLCALC 156 (H) 08/10/2023   Lab Results  Component Value Date   TSH 1.060 12/04/2023   TSH 17.394 (H) 11/02/2023    Therapeutic Level Labs: No results found for: LITHIUM No results found for: VALPROATE No results found for: CBMZ  Current Medications: Current Outpatient Medications  Medication Sig Dispense Refill   acetaminophen  (TYLENOL ) 500 MG tablet Take 2 tablets (1,000 mg total) by mouth every 6 (six) hours.     albuterol  (VENTOLIN  HFA) 108 (90 Base) MCG/ACT inhaler INHALE 2 PUFFS BY MOUTH EVERY 6 HOURS AS NEEDED FOR SHORTNESS OF BREATH 18 g 5   benztropine  (COGENTIN ) 1 MG tablet Take 1 tablet (1 mg total) by mouth 2 (two) times daily. 180 tablet 1   Black Cohosh 450 MG CAPS Take 450 mg by mouth at bedtime.     cariprazine   (VRAYLAR ) 1.5 MG capsule Take 1 capsule (1.5 mg total) by mouth daily. 90 capsule 1   Continuous Glucose Sensor (FREESTYLE LIBRE 3 PLUS SENSOR) MISC Change sensor every 15 days. 6 each 6   Ferrous Sulfate  (IRON) 325 (65 Fe) MG TABS Take 1 tablet by mouth once daily with breakfast 90 tablet 1   fluconazole  (DIFLUCAN ) 150 MG tablet Take 1 tablet weekly for the next 3 weeks for treatment of vaginal yeast infection. 3 tablet 0   FLUoxetine  (PROZAC ) 40 MG capsule Take 1 capsule (40 mg total) by mouth daily. 90 capsule 1   fluticasone  (FLONASE ) 50 MCG/ACT nasal spray Place 1 spray into both nostrils daily. 1 spray in each nostril every day 16 g 1   fluticasone -salmeterol (WIXELA INHUB) 250-50 MCG/ACT AEPB Inhale 1 puff into the lungs in the morning and at bedtime. 60 each 1   furosemide  (LASIX ) 40 MG tablet Take 1 tablet (40 mg total) by mouth daily. 90 tablet 1   gabapentin  (NEURONTIN ) 100 MG capsule Take 1 capsule (100 mg total) by mouth 3 (three) times daily.     gabapentin  (NEURONTIN ) 300 MG capsule TAKE 2 CAPSULES BY MOUTH 4 TIMES DAILY 240 capsule 0   hydrOXYzine  (ATARAX ) 50 MG tablet Take 1-2 tablets (50-100 mg total) by mouth 3 (three) times daily as needed for anxiety. 240 tablet 1   ipratropium-albuterol  (DUONEB) 0.5-2.5 (3) MG/3ML SOLN Take 3 mLs by nebulization 3 (three) times daily as needed. (Patient taking differently: Take 3 mLs by nebulization 3 (three) times daily as needed (for shortness of breath).) 360 mL 3   isosorbide -hydrALAZINE  (BIDIL ) 20-37.5 MG tablet Take 1 tablet by mouth 3 (three) times daily. 180 tablet 2   LANTUS  SOLOSTAR 100 UNIT/ML Solostar Pen INJECT 65 UNITS SUBCUTANEOUSLY TWICE DAILY (Patient taking differently: Inject 65 Units into the skin 2 (two) times daily at 8 am and 10 pm.) 15 mL 5   levothyroxine  (SYNTHROID ) 300 MCG tablet Take 1  tablet (300 mcg total) by mouth daily at 6 (six) AM. 90 tablet 3   lidocaine  4 % Place 1 patch onto the skin daily as needed (for  pain).     Lidocaine -Menthol (CBD4 FREEZE PUMP VANISH SCENT) 4-1 % CREA Apply 1 Application topically daily as needed (pain).     losartan  (COZAAR ) 25 MG tablet Take 1 tablet (25 mg total) by mouth at bedtime. 30 tablet 1   methocarbamol  (ROBAXIN ) 500 MG tablet TAKE 2 TABLETS BY MOUTH THREE TIMES DAILY 180 tablet 0   metoprolol  succinate (TOPROL -XL) 50 MG 24 hr tablet Take 1 tablet (50 mg total) by mouth at bedtime. Take with or immediately following a meal. 90 tablet 3   mirtazapine  (REMERON ) 15 MG tablet Take 1 tablet (15 mg total) by mouth at bedtime. 90 tablet 1   NOVOLOG  FLEXPEN 100 UNIT/ML FlexPen INJECT 15 UNITS SUBCUTANEOUSLY WITH LUNCH , AND THEN INJECT 20 UNITS WITH SUPPER 15 mL 5   oxyCODONE -acetaminophen  (PERCOCET/ROXICET) 5-325 MG tablet Take 1 tablet by mouth every 6 (six) hours as needed for severe pain (pain score 7-10).     pantoprazole  (PROTONIX ) 40 MG tablet Take 1 tablet (40 mg total) by mouth daily. 90 tablet 1   QUEtiapine  Fumarate 150 MG TABS Take 150 mg by mouth at bedtime. 90 tablet 1   rosuvastatin  (CRESTOR ) 20 MG tablet Take 1 tablet (20 mg total) by mouth daily. 90 tablet 3   tirzepatide  (MOUNJARO ) 15 MG/0.5ML Pen Inject 15 mg into the skin once a week. 2 mL 3   Zoster Vaccine Adjuvanted (SHINGRIX ) injection Administer Shingrix  vaccination now and repeat in two months 1 each 1   No current facility-administered medications for this visit.     Musculoskeletal: Strength & Muscle Tone: within normal limits, telehealth visit Gait & Station: normal, telehealth visit Patient leans: N/A  Psychiatric Specialty Exam: Review of Systems  There were no vitals taken for this visit.There is no height or weight on file to calculate BMI.  General Appearance: Well Groomed  Eye Contact:  Good  Speech:  Clear and Coherent and Normal Rate  Volume:  Normal  Mood:  Euthymic,   Affect:  Appropriate and Congruent  Thought Process:  Coherent, Goal Directed and Linear  Orientation:   Full (Time, Place, and Person)  Thought Content: WDL and Logical   Suicidal Thoughts:  Yes.  without intent/plan  Homicidal Thoughts:  No  Memory:  Immediate;   Good Recent;   Good Remote;   Good  Judgement:  Good  Insight:  Good and Fair  Psychomotor Activity:  Normal  Concentration:  Concentration: Good and Attention Span: Good  Recall:  Good  Fund of Knowledge: Good  Language: Good  Akathisia:  No  Handed:  Right  AIMS (if indicated): Not done  Assets:  Communication Skills Desire for Improvement Financial Resources/Insurance Housing Social Support  ADL's:  Intact  Cognition: WNL  Sleep:  Good   Screenings: AIMS    Flowsheet Row Admission (Discharged) from 02/24/2022 in [facility] INPATIENT BEHAVIORAL MEDICINE  AIMS Total Score 0   AUDIT    Flowsheet Row Admission (Discharged) from 02/24/2022 in [facility] INPATIENT BEHAVIORAL MEDICINE  Alcohol Use Disorder Identification Test Final Score (AUDIT) 0   GAD-7    Flowsheet Row Video Visit from 04/13/2024 in [facility] Video Visit from 02/10/2024 in [facility] Video Visit from 08/05/2023 in [facility] Video Visit from 05/21/2023 in [facility] Video Visit from 04/23/2023 in [facility]  Total GAD-7 Score 10 8 6 13 21    PHQ2-9    Flowsheet Row Video Visit from 04/13/2024 in [facility] Video Visit from 02/10/2024 in [facility] Office Visit from 12/04/2023 in [facility] [place] Video Visit from 08/05/2023 in [facility] Video Visit from 05/21/2023 in [facility]  PHQ-2 Total Score 3 4 6 2 2   PHQ-9 Total Score 5 10 23 2 3    Flowsheet Row ED to Hosp-Admission (Discharged) from 02/23/2024 in [facility] GENERAL MED/SURG UNIT ED to  Hosp-Admission (Discharged) from 11/01/2023 in [facility] 3E HF PCU ED from 07/07/2023 in [facility] Emergency Department at [facility]  C-SSRS RISK CATEGORY No Risk No Risk No Risk     Assessment and Plan: Patient reports that her sleep and depression has improved since her last visit.  At times she notes that she continues to have anxiety. Patient reports she has tremoring in her hand.  She notes that Cogentin  is somewhat effective.  Today Cogentin  0.5 mg twice daily increased to 1 mg twice daily to help manage movements.  Hydroxyzine  50 mg 3 times daily increased to 50 to 100 mg 3 times a day to help manage anxiety.  She will continue her other medications as prescribed. She referred to outpatient counseling for therapy.   1. Generalized anxiety disorder  Increased- hydrOXYzine  (ATARAX ) 50 MG tablet; Take 1-2 tablets (50-100 mg total) by mouth 3 (three) times daily as needed for anxiety.  Dispense: 240 tablet; Refill: 1 Continue- QUEtiapine  Fumarate 150 MG TABS; Take 150 mg by mouth at bedtime.  Dispense: 90 tablet; Refill: 1 Continue- mirtazapine  (REMERON ) 15 MG tablet; Take 1 tablet (15 mg total) by mouth at bedtime.  Dispense: 90 tablet; Refill: 1 - Ambulatory referral to Social Work  2. Mild depression (Primary)  Continue- FLUoxetine  (PROZAC ) 40 MG capsule; Take 1 capsule (40 mg total) by mouth daily.  Dispense: 90 capsule; Refill: 1 Continue- QUEtiapine  Fumarate 150 MG TABS; Take 150 mg by mouth at bedtime.  Dispense: 90 tablet; Refill: 1 Continue- mirtazapine  (REMERON ) 15 MG tablet; Take 1 tablet (15 mg total) by mouth at bedtime.  Dispense: 90 tablet; Refill: 1 Continue- cariprazine  (VRAYLAR ) 1.5 MG capsule; Take 1 capsule (1.5 mg total) by mouth daily.  Dispense: 90 capsule; Refill: 1 Increased- benztropine  (COGENTIN ) 1 MG tablet; Take 1 tablet (1 mg total) by mouth 2 (two) times daily.  Dispense: 180 tablet; Refill: 1    Follow-up in 2.5 months  Zane FORBES Bach, NP 04/13/2024, 12:13 PM

## 2024-05-17 ENCOUNTER — Ambulatory Visit (HOSPITAL_COMMUNITY)

## 2024-06-22 ENCOUNTER — Telehealth (HOSPITAL_COMMUNITY): Admitting: Psychiatry
